# Patient Record
Sex: Male | Born: 1935
Health system: Southern US, Community
[De-identification: ages and names within clinical notes are randomized; demographics above are authoritative.]

## PROBLEM LIST (undated history)

## (undated) DIAGNOSIS — I509 Heart failure, unspecified: Secondary | ICD-10-CM

## (undated) DIAGNOSIS — R32 Unspecified urinary incontinence: Secondary | ICD-10-CM

## (undated) DIAGNOSIS — K469 Unspecified abdominal hernia without obstruction or gangrene: Secondary | ICD-10-CM

## (undated) DIAGNOSIS — IMO0002 Reserved for concepts with insufficient information to code with codable children: Secondary | ICD-10-CM

## (undated) DIAGNOSIS — H269 Unspecified cataract: Secondary | ICD-10-CM

## (undated) DIAGNOSIS — E7800 Pure hypercholesterolemia, unspecified: Secondary | ICD-10-CM

## (undated) DIAGNOSIS — R011 Cardiac murmur, unspecified: Secondary | ICD-10-CM

## (undated) DIAGNOSIS — I10 Essential (primary) hypertension: Secondary | ICD-10-CM

## (undated) DIAGNOSIS — K219 Gastro-esophageal reflux disease without esophagitis: Secondary | ICD-10-CM

## (undated) DIAGNOSIS — N189 Chronic kidney disease, unspecified: Secondary | ICD-10-CM

## (undated) DIAGNOSIS — IMO0001 Reserved for inherently not codable concepts without codable children: Secondary | ICD-10-CM

## (undated) DIAGNOSIS — M1990 Unspecified osteoarthritis, unspecified site: Secondary | ICD-10-CM

## (undated) DIAGNOSIS — K389 Disease of appendix, unspecified: Secondary | ICD-10-CM

## (undated) DIAGNOSIS — K859 Acute pancreatitis without necrosis or infection, unspecified: Secondary | ICD-10-CM

## (undated) DIAGNOSIS — C61 Malignant neoplasm of prostate: Secondary | ICD-10-CM

## (undated) DIAGNOSIS — I2510 Atherosclerotic heart disease of native coronary artery without angina pectoris: Secondary | ICD-10-CM

## (undated) DIAGNOSIS — I480 Paroxysmal atrial fibrillation: Secondary | ICD-10-CM

## (undated) HISTORY — DX: Unspecified urinary incontinence: R32

## (undated) HISTORY — PX: BACK SURGERY: SHX140

## (undated) HISTORY — PX: CORONARY ARTERY BYPASS GRAFT: SHX141

## (undated) HISTORY — DX: Gastro-esophageal reflux disease without esophagitis: K21.9

## (undated) HISTORY — DX: Cardiac murmur, unspecified: R01.1

## (undated) HISTORY — PX: OTHER SURGICAL HISTORY: SHX169

## (undated) HISTORY — PX: APPENDECTOMY: SHX54

## (undated) HISTORY — DX: Unspecified osteoarthritis, unspecified site: M19.90

## (undated) HISTORY — PX: RECTAL SURGERY: SHX760

## (undated) HISTORY — DX: Atherosclerotic heart disease of native coronary artery without angina pectoris: I25.10

## (undated) HISTORY — PX: HERNIA REPAIR: SHX51

## (undated) HISTORY — DX: Pure hypercholesterolemia, unspecified: E78.00

## (undated) HISTORY — DX: Chronic kidney disease, unspecified: N18.9

## (undated) HISTORY — DX: Acute pancreatitis without necrosis or infection, unspecified: K85.90

## (undated) HISTORY — DX: Unspecified cataract: H26.9

## (undated) HISTORY — DX: Reserved for concepts with insufficient information to code with codable children: IMO0002

---

## 2001-07-21 ENCOUNTER — Ambulatory Visit (HOSPITAL_COMMUNITY): Admission: RE | Admit: 2001-07-21 | Discharge: 2001-07-21 | Payer: Self-pay | Admitting: Gastroenterology

## 2001-07-21 ENCOUNTER — Other Ambulatory Visit: Admission: RE | Admit: 2001-07-21 | Discharge: 2001-07-21 | Payer: Self-pay | Admitting: Gastroenterology

## 2001-07-21 ENCOUNTER — Encounter: Payer: Self-pay | Admitting: Gastroenterology

## 2001-07-21 ENCOUNTER — Encounter (INDEPENDENT_AMBULATORY_CARE_PROVIDER_SITE_OTHER): Payer: Self-pay | Admitting: Specialist

## 2001-07-28 ENCOUNTER — Encounter (INDEPENDENT_AMBULATORY_CARE_PROVIDER_SITE_OTHER): Payer: Self-pay | Admitting: Specialist

## 2001-07-28 ENCOUNTER — Other Ambulatory Visit: Admission: RE | Admit: 2001-07-28 | Discharge: 2001-07-28 | Payer: Self-pay | Admitting: Gastroenterology

## 2002-10-18 HISTORY — PX: CARPAL TUNNEL RELEASE: SHX101

## 2002-11-22 ENCOUNTER — Encounter: Admission: RE | Admit: 2002-11-22 | Discharge: 2002-11-22 | Payer: Self-pay | Admitting: Family Medicine

## 2002-11-22 ENCOUNTER — Encounter: Payer: Self-pay | Admitting: Family Medicine

## 2003-07-11 ENCOUNTER — Ambulatory Visit (HOSPITAL_COMMUNITY): Admission: RE | Admit: 2003-07-11 | Discharge: 2003-07-11 | Payer: Self-pay | Admitting: General Surgery

## 2003-10-19 HISTORY — PX: ROBOT ASSISTED LAPAROSCOPIC RADICAL PROSTATECTOMY: SHX5141

## 2004-03-01 DIAGNOSIS — C61 Malignant neoplasm of prostate: Secondary | ICD-10-CM

## 2004-03-01 HISTORY — DX: Malignant neoplasm of prostate: C61

## 2004-04-13 ENCOUNTER — Ambulatory Visit (HOSPITAL_COMMUNITY): Admission: RE | Admit: 2004-04-13 | Discharge: 2004-04-13 | Payer: Self-pay | Admitting: Urology

## 2004-05-19 ENCOUNTER — Encounter (INDEPENDENT_AMBULATORY_CARE_PROVIDER_SITE_OTHER): Payer: Self-pay | Admitting: *Deleted

## 2004-05-19 ENCOUNTER — Inpatient Hospital Stay (HOSPITAL_COMMUNITY): Admission: RE | Admit: 2004-05-19 | Discharge: 2004-05-22 | Payer: Self-pay | Admitting: Urology

## 2004-11-10 ENCOUNTER — Observation Stay (HOSPITAL_COMMUNITY): Admission: RE | Admit: 2004-11-10 | Discharge: 2004-11-11 | Payer: Self-pay | Admitting: Urology

## 2005-06-22 ENCOUNTER — Ambulatory Visit: Payer: Self-pay | Admitting: Family Medicine

## 2005-07-27 ENCOUNTER — Ambulatory Visit: Payer: Self-pay | Admitting: Family Medicine

## 2006-09-27 ENCOUNTER — Ambulatory Visit: Payer: Self-pay | Admitting: Family Medicine

## 2006-09-27 LAB — CONVERTED CEMR LAB
ALT: 79 units/L — ABNORMAL HIGH (ref 0–40)
AST: 41 units/L — ABNORMAL HIGH (ref 0–37)
Basophils Relative: 0.6 % (ref 0.0–1.0)
CO2: 24 meq/L (ref 19–32)
Chloride: 107 meq/L (ref 96–112)
Creatinine, Ser: 0.9 mg/dL (ref 0.4–1.5)
Creatinine,U: 135.4 mg/dL
Eosinophil percent: 2 % (ref 0.0–5.0)
Glucose, Bld: 133 mg/dL — ABNORMAL HIGH (ref 70–99)
HCT: 48.1 % (ref 39.0–52.0)
HDL: 30.2 mg/dL — ABNORMAL LOW (ref >39.0)
Hgb A1c MFr Bld: 6.8 % — ABNORMAL HIGH (ref 4.6–6.0)
LDL DIRECT: 175 mg/dL
PSA: 0.17 ng/mL (ref 0.10–4.00)
Platelets: 203 10*3/uL (ref 150–400)
RBC: 5.19 M/uL (ref 4.22–5.81)
RDW: 12.3 % (ref 11.5–14.6)
Sodium: 139 meq/L (ref 135–145)
Triglyceride fasting, serum: 306 mg/dL (ref 0–149)
VLDL: 61 mg/dL — ABNORMAL HIGH (ref 0–40)
WBC: 7.8 10*3/uL (ref 4.5–10.5)

## 2006-10-05 ENCOUNTER — Ambulatory Visit: Payer: Self-pay | Admitting: Family Medicine

## 2006-10-29 ENCOUNTER — Encounter: Admission: RE | Admit: 2006-10-29 | Discharge: 2006-10-29 | Payer: Self-pay | Admitting: Family Medicine

## 2006-11-17 ENCOUNTER — Ambulatory Visit (HOSPITAL_COMMUNITY): Admission: RE | Admit: 2006-11-17 | Discharge: 2006-11-18 | Payer: Self-pay | Admitting: Neurosurgery

## 2007-01-17 ENCOUNTER — Ambulatory Visit: Payer: Self-pay | Admitting: Family Medicine

## 2007-01-17 ENCOUNTER — Ambulatory Visit (HOSPITAL_BASED_OUTPATIENT_CLINIC_OR_DEPARTMENT_OTHER): Admission: RE | Admit: 2007-01-17 | Discharge: 2007-01-17 | Payer: Self-pay | Admitting: Urology

## 2007-01-17 LAB — CONVERTED CEMR LAB
ALT: 68 units/L — ABNORMAL HIGH (ref 0–40)
Hgb A1c MFr Bld: 6.7 % — ABNORMAL HIGH (ref 4.6–6.0)
Triglycerides: 237 mg/dL (ref 0–149)

## 2007-04-27 ENCOUNTER — Ambulatory Visit: Payer: Self-pay | Admitting: Family Medicine

## 2007-04-27 LAB — CONVERTED CEMR LAB
ALT: 78 units/L — ABNORMAL HIGH (ref 0–53)
Cholesterol: 276 mg/dL (ref 0–200)
Direct LDL: 171 mg/dL
VLDL: 84 mg/dL — ABNORMAL HIGH (ref 0–40)

## 2007-05-17 ENCOUNTER — Telehealth: Payer: Self-pay | Admitting: Family Medicine

## 2007-06-12 ENCOUNTER — Encounter: Payer: Self-pay | Admitting: Family Medicine

## 2007-09-06 ENCOUNTER — Telehealth: Payer: Self-pay | Admitting: Family Medicine

## 2007-11-22 DIAGNOSIS — I10 Essential (primary) hypertension: Secondary | ICD-10-CM | POA: Insufficient documentation

## 2007-11-23 ENCOUNTER — Ambulatory Visit: Payer: Self-pay | Admitting: Family Medicine

## 2007-11-23 DIAGNOSIS — E785 Hyperlipidemia, unspecified: Secondary | ICD-10-CM | POA: Insufficient documentation

## 2007-11-23 DIAGNOSIS — E039 Hypothyroidism, unspecified: Secondary | ICD-10-CM | POA: Insufficient documentation

## 2007-11-23 DIAGNOSIS — N390 Urinary tract infection, site not specified: Secondary | ICD-10-CM

## 2007-11-23 DIAGNOSIS — D649 Anemia, unspecified: Secondary | ICD-10-CM | POA: Insufficient documentation

## 2007-11-23 DIAGNOSIS — E1121 Type 2 diabetes mellitus with diabetic nephropathy: Secondary | ICD-10-CM

## 2007-11-23 DIAGNOSIS — T50995A Adverse effect of other drugs, medicaments and biological substances, initial encounter: Secondary | ICD-10-CM

## 2007-11-23 DIAGNOSIS — R0609 Other forms of dyspnea: Secondary | ICD-10-CM

## 2007-11-23 DIAGNOSIS — R0989 Other specified symptoms and signs involving the circulatory and respiratory systems: Secondary | ICD-10-CM

## 2007-11-23 DIAGNOSIS — L719 Rosacea, unspecified: Secondary | ICD-10-CM

## 2007-11-29 ENCOUNTER — Encounter: Payer: Self-pay | Admitting: Family Medicine

## 2007-11-29 ENCOUNTER — Ambulatory Visit: Payer: Self-pay

## 2007-12-05 ENCOUNTER — Telehealth: Payer: Self-pay | Admitting: Family Medicine

## 2007-12-07 ENCOUNTER — Telehealth (INDEPENDENT_AMBULATORY_CARE_PROVIDER_SITE_OTHER): Payer: Self-pay | Admitting: *Deleted

## 2007-12-08 ENCOUNTER — Ambulatory Visit: Payer: Self-pay

## 2007-12-08 ENCOUNTER — Encounter: Payer: Self-pay | Admitting: Family Medicine

## 2008-01-18 ENCOUNTER — Ambulatory Visit: Payer: Self-pay | Admitting: Gastroenterology

## 2008-02-07 ENCOUNTER — Encounter: Payer: Self-pay | Admitting: Gastroenterology

## 2008-02-07 ENCOUNTER — Encounter: Payer: Self-pay | Admitting: Family Medicine

## 2008-02-07 ENCOUNTER — Ambulatory Visit: Payer: Self-pay | Admitting: Gastroenterology

## 2008-04-05 ENCOUNTER — Telehealth: Payer: Self-pay | Admitting: Family Medicine

## 2008-05-22 ENCOUNTER — Ambulatory Visit: Payer: Self-pay | Admitting: Family Medicine

## 2008-05-22 ENCOUNTER — Ambulatory Visit: Payer: Self-pay | Admitting: Cardiology

## 2008-05-22 ENCOUNTER — Inpatient Hospital Stay (HOSPITAL_COMMUNITY): Admission: AD | Admit: 2008-05-22 | Discharge: 2008-05-31 | Payer: Self-pay | Admitting: Cardiology

## 2008-05-23 ENCOUNTER — Encounter: Payer: Self-pay | Admitting: Family Medicine

## 2008-05-23 ENCOUNTER — Encounter: Payer: Self-pay | Admitting: Cardiothoracic Surgery

## 2008-05-23 ENCOUNTER — Telehealth: Payer: Self-pay | Admitting: Family Medicine

## 2008-05-23 ENCOUNTER — Ambulatory Visit: Payer: Self-pay | Admitting: Cardiothoracic Surgery

## 2008-05-24 ENCOUNTER — Encounter: Payer: Self-pay | Admitting: Cardiology

## 2008-05-24 ENCOUNTER — Encounter: Payer: Self-pay | Admitting: Cardiothoracic Surgery

## 2008-06-10 ENCOUNTER — Ambulatory Visit: Payer: Self-pay | Admitting: Cardiology

## 2008-06-21 ENCOUNTER — Ambulatory Visit: Payer: Self-pay | Admitting: Cardiothoracic Surgery

## 2008-06-21 ENCOUNTER — Encounter: Admission: RE | Admit: 2008-06-21 | Discharge: 2008-06-21 | Payer: Self-pay | Admitting: Cardiothoracic Surgery

## 2008-07-04 ENCOUNTER — Encounter (HOSPITAL_COMMUNITY): Admission: RE | Admit: 2008-07-04 | Discharge: 2008-10-02 | Payer: Self-pay | Admitting: Cardiology

## 2008-07-04 ENCOUNTER — Encounter: Payer: Self-pay | Admitting: Family Medicine

## 2008-07-05 ENCOUNTER — Encounter: Admission: RE | Admit: 2008-07-05 | Discharge: 2008-07-05 | Payer: Self-pay | Admitting: Cardiothoracic Surgery

## 2008-07-05 ENCOUNTER — Ambulatory Visit: Payer: Self-pay | Admitting: Cardiothoracic Surgery

## 2008-07-19 ENCOUNTER — Encounter: Admission: RE | Admit: 2008-07-19 | Discharge: 2008-07-19 | Payer: Self-pay | Admitting: Cardiothoracic Surgery

## 2008-07-19 ENCOUNTER — Ambulatory Visit: Payer: Self-pay | Admitting: Cardiothoracic Surgery

## 2008-08-02 ENCOUNTER — Encounter: Admission: RE | Admit: 2008-08-02 | Discharge: 2008-08-02 | Payer: Self-pay | Admitting: Cardiothoracic Surgery

## 2008-08-02 ENCOUNTER — Ambulatory Visit: Payer: Self-pay | Admitting: Cardiothoracic Surgery

## 2008-08-06 ENCOUNTER — Ambulatory Visit: Payer: Self-pay | Admitting: Cardiology

## 2008-08-06 LAB — CONVERTED CEMR LAB
Alkaline Phosphatase: 64 units/L (ref 39–117)
Bilirubin, Direct: 0.2 mg/dL (ref 0.0–0.3)
Cholesterol: 103 mg/dL (ref 0–200)
LDL Cholesterol: 57 mg/dL (ref 0–99)
Total Bilirubin: 1.2 mg/dL (ref 0.3–1.2)
Total Protein: 6.9 g/dL (ref 6.0–8.3)

## 2008-08-07 ENCOUNTER — Inpatient Hospital Stay (HOSPITAL_COMMUNITY): Admission: EM | Admit: 2008-08-07 | Discharge: 2008-08-09 | Payer: Self-pay | Admitting: Emergency Medicine

## 2008-08-07 ENCOUNTER — Ambulatory Visit: Payer: Self-pay | Admitting: Cardiology

## 2008-08-15 ENCOUNTER — Ambulatory Visit: Payer: Self-pay | Admitting: Cardiology

## 2008-08-15 LAB — CONVERTED CEMR LAB
BUN: 17 mg/dL (ref 6–23)
CO2: 27 meq/L (ref 19–32)
Chloride: 104 meq/L (ref 96–112)
Creatinine, Ser: 0.9 mg/dL (ref 0.4–1.5)
GFR calc non Af Amer: 88 mL/min
Glucose, Bld: 211 mg/dL — ABNORMAL HIGH (ref 70–99)

## 2008-08-16 ENCOUNTER — Ambulatory Visit: Payer: Self-pay | Admitting: Cardiology

## 2008-09-27 ENCOUNTER — Ambulatory Visit: Payer: Self-pay | Admitting: Cardiothoracic Surgery

## 2008-09-27 ENCOUNTER — Encounter: Admission: RE | Admit: 2008-09-27 | Discharge: 2008-09-27 | Payer: Self-pay | Admitting: Cardiothoracic Surgery

## 2008-10-14 ENCOUNTER — Ambulatory Visit: Payer: Self-pay | Admitting: Cardiology

## 2008-10-14 LAB — CONVERTED CEMR LAB
BUN: 14 mg/dL (ref 6–23)
Basophils Absolute: 0.2 10*3/uL — ABNORMAL HIGH (ref 0.0–0.1)
Basophils Relative: 2.2 % (ref 0.0–3.0)
Calcium: 9.7 mg/dL (ref 8.4–10.5)
Creatinine, Ser: 0.8 mg/dL (ref 0.4–1.5)
Eosinophils Absolute: 0.2 10*3/uL (ref 0.0–0.7)
Eosinophils Relative: 2.7 % (ref 0.0–5.0)
GFR calc non Af Amer: 101 mL/min
HCT: 42.4 % (ref 39.0–52.0)
Hemoglobin: 15 g/dL (ref 13.0–17.0)
MCHC: 35.3 g/dL (ref 30.0–36.0)
MCV: 83.1 fL (ref 78.0–100.0)
Monocytes Absolute: 0.7 10*3/uL (ref 0.1–1.0)
Neutro Abs: 4.7 10*3/uL (ref 1.4–7.7)
RBC: 5.11 M/uL (ref 4.22–5.81)
WBC: 9.1 10*3/uL (ref 4.5–10.5)
aPTT: 29.8 s (ref 21.7–29.8)

## 2008-10-17 ENCOUNTER — Inpatient Hospital Stay (HOSPITAL_BASED_OUTPATIENT_CLINIC_OR_DEPARTMENT_OTHER): Admission: RE | Admit: 2008-10-17 | Discharge: 2008-10-17 | Payer: Self-pay | Admitting: Cardiology

## 2008-10-17 ENCOUNTER — Ambulatory Visit: Payer: Self-pay | Admitting: Cardiovascular Disease

## 2008-10-17 ENCOUNTER — Ambulatory Visit: Payer: Self-pay | Admitting: Cardiology

## 2008-10-17 ENCOUNTER — Inpatient Hospital Stay (HOSPITAL_COMMUNITY): Admission: AD | Admit: 2008-10-17 | Discharge: 2008-10-18 | Payer: Self-pay | Admitting: Cardiovascular Disease

## 2008-11-05 ENCOUNTER — Ambulatory Visit: Payer: Self-pay | Admitting: Cardiology

## 2008-11-06 ENCOUNTER — Ambulatory Visit: Payer: Self-pay | Admitting: Family Medicine

## 2008-11-06 DIAGNOSIS — E876 Hypokalemia: Secondary | ICD-10-CM

## 2008-11-06 DIAGNOSIS — I25810 Atherosclerosis of coronary artery bypass graft(s) without angina pectoris: Secondary | ICD-10-CM

## 2008-11-11 LAB — CONVERTED CEMR LAB: PSA: 0.31 ng/mL (ref 0.10–4.00)

## 2008-11-18 ENCOUNTER — Ambulatory Visit: Payer: Self-pay | Admitting: Cardiology

## 2008-11-18 LAB — CONVERTED CEMR LAB
CO2: 28 meq/L (ref 19–32)
Calcium: 9.7 mg/dL (ref 8.4–10.5)
GFR calc Af Amer: 94 mL/min
Glucose, Bld: 155 mg/dL — ABNORMAL HIGH (ref 70–99)
Potassium: 4.3 meq/L (ref 3.5–5.1)
Sodium: 138 meq/L (ref 135–145)

## 2008-11-21 ENCOUNTER — Telehealth: Payer: Self-pay | Admitting: Family Medicine

## 2008-12-17 ENCOUNTER — Ambulatory Visit: Payer: Self-pay | Admitting: Cardiology

## 2009-01-09 ENCOUNTER — Ambulatory Visit: Payer: Self-pay | Admitting: Cardiology

## 2009-01-09 LAB — CONVERTED CEMR LAB
BUN: 13 mg/dL (ref 6–23)
Chloride: 105 meq/L (ref 96–112)
GFR calc non Af Amer: 87.71 mL/min (ref >60)
Glucose, Bld: 128 mg/dL — ABNORMAL HIGH (ref 70–99)
Potassium: 4.2 meq/L (ref 3.5–5.1)
Sodium: 139 meq/L (ref 135–145)

## 2009-03-18 ENCOUNTER — Ambulatory Visit: Payer: Self-pay | Admitting: Cardiology

## 2009-03-20 ENCOUNTER — Telehealth: Payer: Self-pay | Admitting: Cardiology

## 2009-04-07 ENCOUNTER — Ambulatory Visit: Payer: Self-pay | Admitting: Cardiology

## 2009-04-28 ENCOUNTER — Telehealth: Payer: Self-pay | Admitting: Cardiology

## 2009-04-28 LAB — CONVERTED CEMR LAB
ALT: 20 units/L (ref 0–53)
AST: 20 units/L (ref 0–37)
Albumin: 4 g/dL (ref 3.5–5.2)
Basophils Relative: 0.1 % (ref 0.0–3.0)
Cholesterol: 126 mg/dL (ref 0–200)
Eosinophils Relative: 3.1 % (ref 0.0–5.0)
HDL: 34.6 mg/dL — ABNORMAL LOW (ref >39.00)
LDL Cholesterol: 59 mg/dL (ref 0–99)
Lymphocytes Relative: 30 % (ref 12.0–46.0)
Monocytes Absolute: 0.5 10*3/uL (ref 0.1–1.0)
Monocytes Relative: 7 % (ref 3.0–12.0)
Neutrophils Relative %: 59.8 % (ref 43.0–77.0)
Platelets: 127 10*3/uL — ABNORMAL LOW (ref 150.0–400.0)
RBC: 3.98 M/uL — ABNORMAL LOW (ref 4.22–5.81)
Total Bilirubin: 1.6 mg/dL — ABNORMAL HIGH (ref 0.3–1.2)
VLDL: 32.6 mg/dL (ref 0.0–40.0)
WBC: 6.5 10*3/uL (ref 4.5–10.5)

## 2009-06-04 ENCOUNTER — Encounter (INDEPENDENT_AMBULATORY_CARE_PROVIDER_SITE_OTHER): Payer: Self-pay | Admitting: *Deleted

## 2009-07-16 ENCOUNTER — Ambulatory Visit: Payer: Self-pay | Admitting: Family Medicine

## 2009-07-16 DIAGNOSIS — M129 Arthropathy, unspecified: Secondary | ICD-10-CM | POA: Insufficient documentation

## 2009-07-16 DIAGNOSIS — E559 Vitamin D deficiency, unspecified: Secondary | ICD-10-CM | POA: Insufficient documentation

## 2009-07-16 LAB — HM COLONOSCOPY

## 2009-07-20 LAB — CONVERTED CEMR LAB
ALT: 21 units/L (ref 0–53)
Alkaline Phosphatase: 58 units/L (ref 39–117)
BUN: 24 mg/dL — ABNORMAL HIGH (ref 6–23)
Bilirubin, Direct: 0.1 mg/dL (ref 0.0–0.3)
Calcium: 9.9 mg/dL (ref 8.4–10.5)
Chloride: 107 meq/L (ref 96–112)
Cholesterol: 129 mg/dL (ref 0–200)
Creatinine, Ser: 1.3 mg/dL (ref 0.4–1.5)
Eosinophils Absolute: 0.2 10*3/uL (ref 0.0–0.7)
Eosinophils Relative: 2.8 % (ref 0.0–5.0)
HDL: 31 mg/dL — ABNORMAL LOW (ref >39.00)
Hgb A1c MFr Bld: 6.4 % (ref 4.6–6.5)
LDL Cholesterol: 71 mg/dL (ref 0–99)
Lymphocytes Relative: 29.4 % (ref 12.0–46.0)
MCV: 94.9 fL (ref 78.0–100.0)
Monocytes Absolute: 0.4 10*3/uL (ref 0.1–1.0)
Neutrophils Relative %: 60.9 % (ref 43.0–77.0)
PSA: 0.31 ng/mL (ref 0.10–4.00)
Platelets: 127 10*3/uL — ABNORMAL LOW (ref 150.0–400.0)
Total Bilirubin: 1.8 mg/dL — ABNORMAL HIGH (ref 0.3–1.2)
Total CHOL/HDL Ratio: 4
Triglycerides: 134 mg/dL (ref 0.0–149.0)
VLDL: 26.8 mg/dL (ref 0.0–40.0)
WBC: 6.2 10*3/uL (ref 4.5–10.5)

## 2009-07-30 ENCOUNTER — Telehealth: Payer: Self-pay | Admitting: Cardiology

## 2009-08-07 ENCOUNTER — Telehealth: Payer: Self-pay | Admitting: Family Medicine

## 2009-08-10 ENCOUNTER — Inpatient Hospital Stay (HOSPITAL_COMMUNITY): Admission: EM | Admit: 2009-08-10 | Discharge: 2009-08-15 | Payer: Self-pay | Admitting: Emergency Medicine

## 2009-08-10 ENCOUNTER — Ambulatory Visit: Payer: Self-pay | Admitting: Internal Medicine

## 2009-08-12 ENCOUNTER — Encounter: Payer: Self-pay | Admitting: Cardiology

## 2009-08-12 ENCOUNTER — Encounter: Payer: Self-pay | Admitting: Gastroenterology

## 2009-08-12 ENCOUNTER — Encounter: Payer: Self-pay | Admitting: Physician Assistant

## 2009-08-13 ENCOUNTER — Encounter: Payer: Self-pay | Admitting: Cardiology

## 2009-08-13 ENCOUNTER — Ambulatory Visit: Payer: Self-pay | Admitting: Gastroenterology

## 2009-08-22 ENCOUNTER — Encounter: Payer: Self-pay | Admitting: Cardiology

## 2009-08-29 ENCOUNTER — Telehealth: Payer: Self-pay | Admitting: Cardiology

## 2009-08-29 ENCOUNTER — Encounter (INDEPENDENT_AMBULATORY_CARE_PROVIDER_SITE_OTHER): Payer: Self-pay | Admitting: *Deleted

## 2009-09-03 ENCOUNTER — Encounter: Payer: Self-pay | Admitting: Physician Assistant

## 2009-09-03 ENCOUNTER — Telehealth: Payer: Self-pay | Admitting: Gastroenterology

## 2009-09-03 ENCOUNTER — Ambulatory Visit: Payer: Self-pay | Admitting: Cardiology

## 2009-09-03 DIAGNOSIS — K2211 Ulcer of esophagus with bleeding: Secondary | ICD-10-CM

## 2009-09-03 DIAGNOSIS — K8010 Calculus of gallbladder with chronic cholecystitis without obstruction: Secondary | ICD-10-CM

## 2009-09-05 ENCOUNTER — Ambulatory Visit: Payer: Self-pay | Admitting: Gastroenterology

## 2009-09-05 DIAGNOSIS — R1310 Dysphagia, unspecified: Secondary | ICD-10-CM | POA: Insufficient documentation

## 2009-09-09 ENCOUNTER — Ambulatory Visit: Payer: Self-pay | Admitting: Cardiology

## 2009-09-10 ENCOUNTER — Ambulatory Visit: Payer: Self-pay | Admitting: Gastroenterology

## 2009-09-17 ENCOUNTER — Encounter: Payer: Self-pay | Admitting: Gastroenterology

## 2009-09-17 HISTORY — PX: LAPAROSCOPIC CHOLECYSTECTOMY: SUR755

## 2009-09-30 ENCOUNTER — Encounter: Payer: Self-pay | Admitting: Gastroenterology

## 2009-10-01 ENCOUNTER — Encounter: Payer: Self-pay | Admitting: Gastroenterology

## 2009-10-01 ENCOUNTER — Encounter: Payer: Self-pay | Admitting: Cardiology

## 2009-10-03 ENCOUNTER — Telehealth: Payer: Self-pay | Admitting: Cardiology

## 2009-10-07 ENCOUNTER — Telehealth: Payer: Self-pay | Admitting: Cardiology

## 2009-10-11 ENCOUNTER — Inpatient Hospital Stay (HOSPITAL_COMMUNITY): Admission: EM | Admit: 2009-10-11 | Discharge: 2009-10-20 | Payer: Self-pay | Admitting: Cardiology

## 2009-10-11 ENCOUNTER — Ambulatory Visit: Payer: Self-pay | Admitting: Internal Medicine

## 2009-10-12 ENCOUNTER — Encounter: Payer: Self-pay | Admitting: Cardiology

## 2009-10-14 ENCOUNTER — Encounter: Payer: Self-pay | Admitting: Cardiology

## 2009-10-29 ENCOUNTER — Encounter: Payer: Self-pay | Admitting: Cardiology

## 2009-11-07 ENCOUNTER — Ambulatory Visit: Payer: Self-pay | Admitting: Cardiology

## 2009-11-12 ENCOUNTER — Encounter: Payer: Self-pay | Admitting: Cardiology

## 2009-11-26 ENCOUNTER — Encounter: Payer: Self-pay | Admitting: Family Medicine

## 2009-11-27 ENCOUNTER — Telehealth: Payer: Self-pay | Admitting: Cardiology

## 2009-12-03 ENCOUNTER — Encounter: Payer: Self-pay | Admitting: Cardiology

## 2009-12-18 ENCOUNTER — Encounter: Payer: Self-pay | Admitting: Family Medicine

## 2010-01-08 ENCOUNTER — Ambulatory Visit: Payer: Self-pay | Admitting: Cardiology

## 2010-01-08 DIAGNOSIS — I6529 Occlusion and stenosis of unspecified carotid artery: Secondary | ICD-10-CM

## 2010-01-13 ENCOUNTER — Telehealth: Payer: Self-pay | Admitting: Family Medicine

## 2010-01-27 ENCOUNTER — Ambulatory Visit: Payer: Self-pay

## 2010-01-27 ENCOUNTER — Encounter: Payer: Self-pay | Admitting: Cardiology

## 2010-02-20 ENCOUNTER — Encounter: Payer: Self-pay | Admitting: Cardiology

## 2010-02-24 ENCOUNTER — Ambulatory Visit: Payer: Self-pay | Admitting: Cardiology

## 2010-02-24 LAB — CONVERTED CEMR LAB
BUN: 20 mg/dL (ref 6–23)
Creatinine, Ser: 1.1 mg/dL (ref 0.4–1.5)
Eosinophils Relative: 1.9 % (ref 0.0–5.0)
GFR calc non Af Amer: 70.85 mL/min (ref >60)
Lymphocytes Relative: 23.2 % (ref 12.0–46.0)
Monocytes Relative: 5.8 % (ref 3.0–12.0)
Neutrophils Relative %: 68.8 % (ref 43.0–77.0)
Platelets: 168 10*3/uL (ref 150.0–400.0)
Pro B Natriuretic peptide (BNP): 1051.1 pg/mL — ABNORMAL HIGH (ref 0.0–100.0)
WBC: 8.3 10*3/uL (ref 4.5–10.5)

## 2010-04-02 ENCOUNTER — Ambulatory Visit: Payer: Self-pay | Admitting: Cardiology

## 2010-04-02 DIAGNOSIS — I255 Ischemic cardiomyopathy: Secondary | ICD-10-CM | POA: Insufficient documentation

## 2010-04-02 DIAGNOSIS — G4700 Insomnia, unspecified: Secondary | ICD-10-CM

## 2010-04-02 LAB — CONVERTED CEMR LAB
CO2: 28 meq/L (ref 19–32)
Calcium: 10.4 mg/dL (ref 8.4–10.5)
Chloride: 103 meq/L (ref 96–112)
Sodium: 143 meq/L (ref 135–145)

## 2010-04-06 ENCOUNTER — Ambulatory Visit: Payer: Self-pay | Admitting: Cardiology

## 2010-04-08 ENCOUNTER — Encounter: Payer: Self-pay | Admitting: Cardiology

## 2010-04-09 LAB — CONVERTED CEMR LAB
BUN: 46 mg/dL — ABNORMAL HIGH (ref 6–23)
Chloride: 103 meq/L (ref 96–112)
Potassium: 4.1 meq/L (ref 3.5–5.1)

## 2010-04-10 ENCOUNTER — Encounter: Payer: Self-pay | Admitting: Cardiology

## 2010-04-29 ENCOUNTER — Encounter: Payer: Self-pay | Admitting: Cardiology

## 2010-05-05 ENCOUNTER — Ambulatory Visit: Payer: Self-pay | Admitting: Cardiology

## 2010-05-07 ENCOUNTER — Encounter: Payer: Self-pay | Admitting: Cardiology

## 2010-06-10 ENCOUNTER — Encounter: Payer: Self-pay | Admitting: Cardiology

## 2010-07-08 ENCOUNTER — Encounter: Payer: Self-pay | Admitting: Family Medicine

## 2010-07-29 ENCOUNTER — Encounter: Payer: Self-pay | Admitting: Family Medicine

## 2010-08-06 ENCOUNTER — Ambulatory Visit: Payer: Self-pay | Admitting: Cardiology

## 2010-08-06 ENCOUNTER — Encounter: Payer: Self-pay | Admitting: Cardiology

## 2010-09-23 ENCOUNTER — Telehealth: Payer: Self-pay | Admitting: Family Medicine

## 2010-09-24 ENCOUNTER — Ambulatory Visit: Payer: Self-pay | Admitting: Family Medicine

## 2010-09-24 ENCOUNTER — Ambulatory Visit: Payer: Self-pay | Admitting: Internal Medicine

## 2010-09-24 DIAGNOSIS — S0990XA Unspecified injury of head, initial encounter: Secondary | ICD-10-CM | POA: Insufficient documentation

## 2010-10-13 ENCOUNTER — Telehealth: Payer: Self-pay | Admitting: Family Medicine

## 2010-11-15 LAB — CONVERTED CEMR LAB
Albumin: 4.7 g/dL (ref 3.5–5.2)
Basophils Absolute: 0 10*3/uL (ref 0.0–0.1)
Bilirubin, Direct: 0.3 mg/dL (ref 0.0–0.3)
CO2: 27 meq/L (ref 19–32)
Calcium: 9.4 mg/dL (ref 8.4–10.5)
Cholesterol, target level: 200 mg/dL
Eosinophils Absolute: 0.2 10*3/uL (ref 0.0–0.6)
GFR calc non Af Amer: 118 mL/min
Glucose, Bld: 129 mg/dL — ABNORMAL HIGH (ref 70–99)
Glucose, Urine, Semiquant: NEGATIVE
HCT: 50 % (ref 39.0–52.0)
Hemoglobin: 16.9 g/dL (ref 13.0–17.0)
Hgb A1c MFr Bld: 6.5 % — ABNORMAL HIGH (ref 4.6–6.0)
LDL Goal: 70 mg/dL
Lymphocytes Relative: 33.6 % (ref 12.0–46.0)
MCHC: 33.7 g/dL (ref 30.0–36.0)
MCV: 91.3 fL (ref 78.0–100.0)
Monocytes Absolute: 0.7 10*3/uL (ref 0.2–0.7)
Neutrophils Relative %: 58.6 % (ref 43.0–77.0)
PSA: 0.28 ng/mL (ref 0.10–4.00)
Potassium: 4 meq/L (ref 3.5–5.1)
Sodium: 139 meq/L (ref 135–145)
Sodium: 143 meq/L (ref 135–145)
Specific Gravity, Urine: >=1.03
TSH: 2.17 microintl units/mL (ref 0.35–5.50)
Total Bilirubin: 1.8 mg/dL — ABNORMAL HIGH (ref 0.3–1.2)
WBC Urine, dipstick: NEGATIVE
pH: 7

## 2010-11-17 NOTE — Medication Information (Signed)
Summary: Prescription Prior Authorization Request   Prescription Prior Authorization Request   Imported By: Sallee Provencal 04/23/2010 12:58:43  _____________________________________________________________________  External Attachment:    Type:   Image     Comment:   External Document

## 2010-11-17 NOTE — Letter (Signed)
Summary: Dr Grandville Silos Note  Dr Grandville Silos Note   Imported By: Jamelle Haring 11/26/2009 09:23:56  _____________________________________________________________________  External Attachment:    Type:   Image     Comment:   External Document

## 2010-11-17 NOTE — Assessment & Plan Note (Signed)
Summary: 1 month f/u   Visit Type:  Follow-up Primary Provider:  Emeterio Reeve MD  CC:  CHF and Afib.  History of Present Illness: The patient has been feeling much better. He is not having any acute shortness of breath. He is having no PND or orthopnea. He denies any chest pressure, neck or arm discomfort. He denies any palpitations, presyncope or syncope. He has had no weight gain or swelling. He continues to have some mild nausea and still has drainage from a small residual wound in his right abdomen. He may need to have surgical treatment of this. He is currently on antibiotics.  Of note I did increase his carvedilol at the last visit. He sits very mild dizziness and is bradycardic but overall seems to be tolerating this.  Current Medications (verified): 1)  Avandamet 4-500 Mg  Tabs (Rosiglitazone-Metformin) .... Two Times A Day 2)  Metrolotion 0.75 %  Lotn (Metronidazole) .... Apply Daily 3)  Freestyle Lancets  Misc (Lancets) .... Use Two Times A Day As Directed 4)  Freestyle Lite Test  Strp (Glucose Blood) .... Use  Two Times A Day As Directed 5)  Plavix 75 Mg Tabs (Clopidogrel Bisulfate) .Marland Kitchen.. 1 By Mouth Once Daily 6)  Benazepril Hcl 40 Mg Tabs (Benazepril Hcl) .... 1/2 By Mouth Two Times A Day 7)  Lipitor 80 Mg Tabs (Atorvastatin Calcium) .... 1/2 Per Day 8)  Clonidine Hcl 0.3 Mg Tabs (Clonidine Hcl) .Marland Kitchen.. 1 By Mouth Two Times A Day 9)  Nitroglycerin 0.4 Mg Subl (Nitroglycerin) .... As Directed Per Dr Percival Spanish 10)  Spironolactone 25 Mg Tabs (Spironolactone) .Marland Kitchen.. 1 By Mouth Daily 11)  Alprazolam 1 Mg Tabs (Alprazolam) .Marland Kitchen.. 1 Three Times A Day As Needed For Anxiety 12)  Ecotrin 325 Mg Tbec (Aspirin) .... Take 1 By Mouth Once Daily 13)  Pantoprazole Sodium 40 Mg Tbec (Pantoprazole Sodium) .Marland Kitchen.. 1 By Mouth Once Daily 14)  Klor-Con 10 10 Meq Cr-Tabs (Potassium Chloride) .... As Needed With Lasix 15)  Tramadol Hcl 50 Mg Tabs (Tramadol Hcl) .... As Needed 16)  Isosorbide Mononitrate  Cr 30 Mg Xr24h-Tab (Isosorbide Mononitrate) .... One Daily 17)  Furosemide 20 Mg Tabs (Furosemide) .... One Daily 18)  Carvedilol 6.25 Mg Tabs (Carvedilol) .... One Twice A Day 19)  Lunesta 1 Mg Tabs (Eszopiclone) .... One At Bedtime As Needed For Sleep 20)  Doxycycline Hyclate 100 Mg Cpep (Doxycycline Hyclate) .... Take 1 Tablet By Mouth Two Times A Day  Allergies: 1)  ! Codeine 2)  ! * Shellfish 3)  ! Morphine  Past History:  Past Medical History: Reviewed history from 01/08/2010 and no changes required. 1. Coronary artery disease (Anatomy as above, CABG, Taxus stent to the saphenous vein       graft to the obtuse marginal by in December 2009, Xience stent to the same graft Oct 2010)  2. Mild aortic stenosis.   3. Recurrent pleural effusions.   4. Hypertension.   5. Dyslipidemia.   6. Diabetes mellitus.   7. Degenerative joint disease.   8. Radical prostatectomy for prostate cancer.   9. Lower esophageal stricture (status post dilatation).   10.Chronic left bundle branch block.   11.Cholelithiasis  12.Cardiomyopathy (EF 30%)  Past Surgical History: Reviewed history from 01/08/2010 and no changes required. Appendectomy Hernia repair  Carpal tunnel  both hands 2004 CABG Laparoscopic cholecystectomy.  (09/2009) Incision and drainage of right chest abscess (09/2009)   Review of Systems       As  stated in the HPI and negative for all other systems.   Vital Signs:  Patient profile:   77 year old male Height:      68 inches Weight:      195.75 pounds BMI:     29.87 Pulse rate:   48 / minute Pulse rhythm:   regular Resp:     18 per minute BP sitting:   138 / 58  (left arm) Cuff size:   large  Vitals Entered By: Sidney Ace (May 05, 2010 9:38 AM)  Physical Exam  General:  Well developed, well nourished, in no acute distress. Head:  normocephalic and atraumatic Eyes:  PERRLA/EOM intact; conjunctiva and lids normal. Mouth:  Upper dentures. Oral mucosa  normal. Neck:  Neck supple, no JVD. No masses, thyromegaly or abnormal cervical nodes. Chest Wall:  well-healed sternotomy scar Lungs:  Clear bilaterally to auscultation and percussion. Abdomen:  Bowel sounds positive; abdomen soft and non-tender without masses, organomegaly, or hernias noted. No hepatosplenomegaly. is a bandage in the right lower quadrantwhich is clean and dry currently and covers a nonhealing percutaneous drainage wound Msk:  Back normal, normal gait. Muscle strength and tone normal. Extremities:  No clubbing or cyanosis. Neurologic:  Alert and oriented x 3. Skin:  Intact without lesions or rashes. Cervical Nodes:  no significant adenopathy Inguinal Nodes:  no significant adenopathy Psych:  Normal affect.   Detailed Cardiovascular Exam  Neck    Carotids: Carotids full and equal bilaterally without bruits.      Neck Veins: Normal, no JVD.    Heart    Inspection: no deformities or lifts noted.      Palpation: normal PMI with no thrills palpable.      Auscultation: regular rate and rhythm, S1, S2 with soft systolic murmurs, rubs, gallops, or clicks.    Vascular    Abdominal Aorta: no palpable masses, pulsations, or audible bruits.      Femoral Pulses: normal femoral pulses bilaterally.      Pedal Pulses: normal pedal pulses bilaterally.      Radial Pulses: diminished right radial pulse and diminished left radial pulse.      Peripheral Circulation: no clubbing, cyanosis, or edema noted with normal capillary refill.     EKG  Procedure date:  05/05/2010  Findings:      Sinus bradycardia, rate 48, left bundle branch block  Impression & Recommendations:  Problem # 1:  CARDIOMYOPATHY, ISCHEMIC (ICD-414.8) Htolerating the meds as listed though he does have bradycardia. I will continue the current regimen. I will be checking a basic metabolic profile today. Orders: EKG w/ Interpretation (93000) TLB-BMP (Basic Metabolic Panel-BMET) (99991111)  Problem # 2:   CORONARY ARTERY DISEASE (ICD-414.00) He has no ongoing angina. He will continue with risk reduction. Orders: EKG w/ Interpretation (93000)  Problem # 3:  HYPERTENSION (ICD-401.9) His blood pressure is controlled. He will continue the meds as listed.  Patient Instructions: 1)  Your physician recommends that you schedule a follow-up appointment in: 1 month with Dr Percival Spanish 2)  Your physician recommends that you return for lab work in: 2 weeks 3)  Your physician recommends that you continue on your current medications as directed. Please refer to the Current Medication list given to you today.  Appended Document: 1 month f/u No change in medications.  Pt doesn't need BMP in 2 weeks and will follow up in 3 months per Dr Percival Spanish.

## 2010-11-17 NOTE — Letter (Signed)
Summary: [facility] - Office Visit  [facility] - Office Visit   Imported By: Marilynne Drivers 07/29/2010 08:08:36  _____________________________________________________________________  External Attachment:    Type:   Image     Comment:   External Document

## 2010-11-17 NOTE — Assessment & Plan Note (Signed)
Summary: 2 MONTH/DMP   Visit Type:  Follow-up Referring Provider:  Georganna Skeans, MD Primary Provider:  Emeterio Reeve MD  CC:  CAD.  History of Present Illness: The patient presents for follow up of his CAD after a recent hospitalization for cystectomy. He's had a somewhat complicated recovery.  He had a laparoscopic cholecystectomy but had an abscess that needed to be drained as well. Since then he has had nausea. He has had abdominal discomfort when he has tried to eat solid foods. He said decreased sleep. He is trying to do some walking around his house. He is not describing PND or orthopnea. He did have an episode of chest and neck discomfort yesterday for which he took 2 sublingual nitroglycerin. This has not been a usual occurrence.  Problems Prior to Update: 1)  Dysphagia Unspecified  (ICD-787.20) 2)  Cholelithiasis, With Cholecystitis  (ICD-574.10) 3)  Ulcer of Esophagus With Bleeding  (ICD-530.21) 4)  Arthritis  (ICD-716.90) 5)  Vitamin D Deficiency  (ICD-268.9) 6)  Special Screening Malignant Neoplasm of Prostate  (ICD-V76.44) 7)  Coronary Artery Disease  (ICD-414.00) 8)  Hypokalemia  (ICD-276.8) 9)  Rosacea  (ICD-695.3) 10)  Neoplasm, Malignant, Prostate  (ICD-185) 11)  Dyspnea On Exertion  (ICD-786.09) 12)  Chest Pain, Exertional  (ICD-786.50) 13)  Diabetes Mellitus, Type II  (ICD-250.00) 14)  Special Screening Malignant Neoplasm of Prostate  (ICD-V76.44) 15)  Uti  (ICD-599.0) 16)  Hyperlipidemia  (ICD-272.4) 17)  Hypothyroidism  (ICD-244.9) 18)  Anemia  (ICD-285.9) 19)  Uns Advrs Eff Oth Rx Medicinal&biological Sbstnc  (ICD-995.29) 20)  Hypertension  (ICD-401.9)  Medications Prior to Update: 1)  Avandamet 4-500 Mg  Tabs (Rosiglitazone-Metformin) .... Two Times A Day 2)  Metrolotion 0.75 %  Lotn (Metronidazole) .... Apply Daily 3)  Freestyle Lancets  Misc (Lancets) .... Use Two Times A Day As Directed 4)  Freestyle Lite Test  Strp (Glucose Blood) .... Use   Two Times A Day As Directed 5)  Plavix 75 Mg Tabs (Clopidogrel Bisulfate) .Marland Kitchen.. 1 By Mouth Once Daily 6)  Benazepril Hcl 20 Mg Tabs (Benazepril Hcl) .Marland Kitchen.. 1 Tab Two Times A Day 7)  Lipitor 80 Mg Tabs (Atorvastatin Calcium) .... 1/2 Per Day 8)  Metoprolol Tartrate 50 Mg Tabs (Metoprolol Tartrate) .Marland Kitchen.. 1 By Mouth Two Times A Day 9)  Clonidine Hcl 0.3 Mg Tabs (Clonidine Hcl) .Marland Kitchen.. 1 By Mouth Two Times A Day 10)  Nitroglycerin 0.4 Mg Subl (Nitroglycerin) .... As Directed Per Dr Percival Spanish 11)  Spironolactone 50 Mg Tabs (Spironolactone) .... Take 1 By Mouth Once Daily 12)  Alprazolam 1 Mg Tabs (Alprazolam) .Marland Kitchen.. 1 Three Times A Day As Needed For Anxiety 13)  Ecotrin 325 Mg Tbec (Aspirin) .... Take 1 By Mouth Once Daily 14)  Ambien 10 Mg Tabs (Zolpidem Tartrate) .... One By Mouth At Bedtime 15)  Pantoprazole Sodium 40 Mg Tbec (Pantoprazole Sodium) .Marland Kitchen.. 1 By Mouth Once Daily  Current Medications (verified): 1)  Avandamet 4-500 Mg  Tabs (Rosiglitazone-Metformin) .... Two Times A Day 2)  Metrolotion 0.75 %  Lotn (Metronidazole) .... Apply Daily 3)  Freestyle Lancets  Misc (Lancets) .... Use Two Times A Day As Directed 4)  Freestyle Lite Test  Strp (Glucose Blood) .... Use  Two Times A Day As Directed 5)  Plavix 75 Mg Tabs (Clopidogrel Bisulfate) .Marland Kitchen.. 1 By Mouth Once Daily 6)  Benazepril Hcl 20 Mg Tabs (Benazepril Hcl) .Marland Kitchen.. 1 Tab Two Times A Day 7)  Lipitor 80 Mg Tabs (Atorvastatin  Calcium) .... 1/2 Per Day 8)  Metoprolol Tartrate 50 Mg Tabs (Metoprolol Tartrate) .Marland Kitchen.. 1 By Mouth Two Times A Day 9)  Clonidine Hcl 0.3 Mg Tabs (Clonidine Hcl) .Marland Kitchen.. 1 By Mouth Two Times A Day 10)  Nitroglycerin 0.4 Mg Subl (Nitroglycerin) .... As Directed Per Dr Percival Spanish 11)  Spironolactone 25 Mg Tabs (Spironolactone) .Marland Kitchen.. 1 By Mouth Daily 12)  Alprazolam 1 Mg Tabs (Alprazolam) .Marland Kitchen.. 1 Three Times A Day As Needed For Anxiety 13)  Ecotrin 325 Mg Tbec (Aspirin) .... Take 1 By Mouth Once Daily 14)  Pantoprazole Sodium 40 Mg Tbec  (Pantoprazole Sodium) .Marland Kitchen.. 1 By Mouth Once Daily 15)  Furosemide 40 Mg Tabs (Furosemide) .... As Needed 16)  Klor-Con 10 10 Meq Cr-Tabs (Potassium Chloride) .... As Needed With Lasix 17)  Tramadol Hcl 50 Mg Tabs (Tramadol Hcl) .... As Needed  Allergies (verified): 1)  ! Codeine 2)  ! * Shellfish 3)  ! Morphine  Past History:  Past Medical History: 1. Coronary artery disease (Anatomy as above, CABG, Taxus stent to the saphenous vein       graft to the obtuse marginal by in December 2009, Xience stent to the same graft Oct 2010)  2. Mild aortic stenosis.   3. Recurrent pleural effusions.   4. Hypertension.   5. Dyslipidemia.   6. Diabetes mellitus.   7. Degenerative joint disease.   8. Radical prostatectomy for prostate cancer.   9. Lower esophageal stricture (status post dilatation).   10.Chronic left bundle branch block.   11.Cholelithiasis  Past Surgical History: Appendectomy Hernia repair  Carpal tunnel  both hands 2004 CABG Laparoscopic cholecystectomy.  (09/2009) Incision and drainage of right chest abscess (09/2009) recent heartbeat and that  Review of Systems       As stated in the HPI and negative for all other systems.   Vital Signs:  Patient profile:   77 year old male Height:      68 inches Weight:      186 pounds BMI:     28.38 Pulse rate:   98 / minute Resp:     16 per minute BP sitting:   148 / 79  (right arm)  Vitals Entered By: Levora Angel, CNA (November 07, 2009 9:29 AM)  l  Physical Exam  General:  He looks fatigued and chronically ill but in no acute distress Head:  normocephalic and atraumatic Eyes:  PERRLA/EOM intact; conjunctiva and lids normal. Mouth:  Oral mucosa and oropharynx without lesions or exudates.  Teeth in good repair. Neck:  No deformities, masses, or tenderness noted. Chest Wall:  Well-healed sternotomy scar Lungs:  Clear bilaterally to auscultation and percussion. Heart:  S1 and S2 within normal limits, no S3, no S4, 2/6  systolic murmur heard at the apex an early peaking with slight radiation up the axilla, no diastolic murmurs. The PMI is somewhat displaced laterally. Abdomen:  Well-healed surgical scars, positive bowel sounds without rebound or guarding, no hepatomegaly, no splenomegaly. There is a bandage in the right upper quadrant which I did not uncover. Msk:  Back normal, normal gait. Muscle strength and tone normal. Extremities:  Mild bilateral lower extremity edema, 2+ pulses, no cyanosis, or clubbing. Neurologic:  Alert and oriented x 3. Skin:  Intact without lesions or rashes. Psych:  Normal affect.   EKG  Procedure date:  11/07/2009  Findings:      sinus rhythm, rate 94, left bundle branch block, right axis deviation.  The axis shift is new compared  with previous and probably related to limb lead reversal  Impression & Recommendations:  Problem # 1:  CORONARY ARTERY DISEASE (ICD-414.00) The patient has had no acute cardiac complaints despite his recent difficult hospitalization. He did have one episode of pain yesterday requiring nitroglycerin. He should continue to be managed medically. At this point I will not titrate his meds further. Orders: EKG w/ Interpretation (93000)  Problem # 2:  HYPERTENSION (ICD-401.9) His blood pressure is slightly elevated. However, given his difficult recovery so far I will not titrate meds to treat this mildly abnormal blood pressure but we'll follow this permit titrations as needed in the future.  Problem # 3:  HYPERLIPIDEMIA (B2193296.4) We can follow this once he has recovered.  Patient Instructions: 1)  Your physician recommends that you schedule a follow-up appointment in: 2 months with Dr Percival Spanish 2)  Your physician recommends that you continue on your current medications as directed. Please refer to the Current Medication list given to you today.

## 2010-11-17 NOTE — Consult Note (Signed)
Summary: Consultation Report  Consultation Report   Imported By: Marilynne Drivers 12/22/2009 10:05:57  _____________________________________________________________________  External Attachment:    Type:   Image     Comment:   External Document

## 2010-11-17 NOTE — Miscellaneous (Signed)
  Clinical Lists Changes  Observations: Added new observation of US CAROTID: MIld to moderate, mixed plaque, bilaterally 40-59% bilateral ICA stenosis  f/u/1 year (01/27/2010 11:20)      Carotid Doppler  Procedure date:  01/27/2010  Findings:      MIld to moderate, mixed plaque, bilaterally 40-59% bilateral ICA stenosis  f/u/1 year

## 2010-11-17 NOTE — Letter (Signed)
Summary: [facility] Office Visit Note   [facility] Office Visit Note   Imported By: Sallee Provencal 06/04/2010 12:33:57  _____________________________________________________________________  External Attachment:    Type:   Image     Comment:   External Document

## 2010-11-17 NOTE — Assessment & Plan Note (Signed)
Summary: 3 month rov 428.22 414.01  pfh,rn   Visit Type:  Follow-up Primary Provider:  Emeterio Reeve MD  CC:  CAD.  History of Present Illness: The patient presents for followup of his coronary disease and cardiomyopathy. Since I last saw him he has been recovering from his gallbladder surgery and infection. He still on antibiotics and had some nausea likely associated with this. He is not having any chest pressure, neck or arm discomfort. He has not had any acute shortness of breath, PND or orthopnea. He does describe some palpitations at night in particular that may last up to 30 seconds though when he takes his pulse his heart rate seems to be regular and slow. He's not had any presyncope or syncope associated with this. He is trying to be active but is not exercising.  Current Medications (verified): 1)  Avandamet 4-500 Mg  Tabs (Rosiglitazone-Metformin) .... Two Times A Day 2)  Metrolotion 0.75 %  Lotn (Metronidazole) .... Apply Daily 3)  Freestyle Lancets  Misc (Lancets) .... Use Two Times A Day As Directed 4)  Freestyle Lite Test  Strp (Glucose Blood) .... Use  Two Times A Day As Directed 5)  Plavix 75 Mg Tabs (Clopidogrel Bisulfate) .Marland Kitchen.. 1 By Mouth Once Daily 6)  Benazepril Hcl 40 Mg Tabs (Benazepril Hcl) .... 1/2 Tablet Twice A Day 7)  Lipitor 80 Mg Tabs (Atorvastatin Calcium) .... 1/2 Per Day 8)  Clonidine Hcl 0.3 Mg Tabs (Clonidine Hcl) .Marland Kitchen.. 1 By Mouth Two Times A Day 9)  Nitroglycerin 0.4 Mg Subl (Nitroglycerin) .... As Directed Per Dr Percival Spanish 10)  Spironolactone 25 Mg Tabs (Spironolactone) .Marland Kitchen.. 1 By Mouth Daily 11)  Alprazolam 1 Mg Tabs (Alprazolam) .Marland Kitchen.. 1 Three Times A Day As Needed For Anxiety 12)  Ecotrin 325 Mg Tbec (Aspirin) .... Take 1 By Mouth Once Daily 13)  Pantoprazole Sodium 40 Mg Tbec (Pantoprazole Sodium) .Marland Kitchen.. 1 By Mouth Once Daily 14)  Klor-Con 10 10 Meq Cr-Tabs (Potassium Chloride) .... As Needed With Lasix 15)  Tramadol Hcl 50 Mg Tabs (Tramadol Hcl) ....  As Needed 16)  Isosorbide Mononitrate Cr 30 Mg Xr24h-Tab (Isosorbide Mononitrate) .... One Daily 17)  Furosemide 20 Mg Tabs (Furosemide) .... One Daily 18)  Carvedilol 6.25 Mg Tabs (Carvedilol) .... One Twice A Day 19)  Lunesta 1 Mg Tabs (Eszopiclone) .... One At Bedtime As Needed For Sleep 20)  Doxycycline Hyclate 100 Mg Cpep (Doxycycline Hyclate) .... Take 1 Tablet By Mouth Two Times A Day 21)  Bactrim 400-80 Mg Tabs (Sulfamethoxazole-Trimethoprim) .Marland Kitchen.. 1 By Mouth Two Times A Day  Allergies (verified): 1)  ! Codeine 2)  ! * Shellfish 3)  ! Morphine  Past History:  Past Medical History: Reviewed history from 01/08/2010 and no changes required. 1. Coronary artery disease (Anatomy as above, CABG, Taxus stent to the saphenous vein       graft to the obtuse marginal by in December 2009, Xience stent to the same graft Oct 2010)  2. Mild aortic stenosis.   3. Recurrent pleural effusions.   4. Hypertension.   5. Dyslipidemia.   6. Diabetes mellitus.   7. Degenerative joint disease.   8. Radical prostatectomy for prostate cancer.   9. Lower esophageal stricture (status post dilatation).   10.Chronic left bundle branch block.   11.Cholelithiasis  12.Cardiomyopathy (EF 30%)  Past Surgical History: Reviewed history from 01/08/2010 and no changes required. Appendectomy Hernia repair  Carpal tunnel  both hands 2004 CABG Laparoscopic cholecystectomy.  (09/2009)  Incision and drainage of right chest abscess (09/2009)   Review of Systems       Status post mild left anterior tibial trauma with soreness and swelling in the left lower extremity. Otherwise as stated in the history of present illness negative for all other systems.  Vital Signs:  Patient profile:   77 year old male Height:      68 inches Weight:      191 pounds BMI:     29.15 Pulse rate:   57 / minute Resp:     16 per minute BP sitting:   128 / 82  (right arm)  Vitals Entered By: Levora Angel, CNA (August 06, 2010  8:56 AM)  Physical Exam  General:  Well developed, well nourished, in no acute distress. Head:  normocephalic and atraumatic Eyes:  PERRLA/EOM intact; conjunctiva and lids normal. Mouth:  Upper dentures. Oral mucosa normal. Neck:  Neck supple, no JVD. No masses, thyromegaly or abnormal cervical nodes. Chest Wall:  well-healed sternotomy scar Lungs:  Clear bilaterally to auscultation and percussion. Abdomen:  Bowel sounds positive; abdomen soft and non-tender without masses, organomegaly, or hernias noted. No hepatosplenomegaly Msk:  Back normal, normal gait. Muscle strength and tone normal. Extremities:  Mild left lower extremity anterior tibial swelling with slight bruising Neurologic:  Alert and oriented x 3. Skin:  Intact without lesions or rashes. Cervical Nodes:  no significant adenopathy Inguinal Nodes:  no significant adenopathy Psych:  Normal affect.   Detailed Cardiovascular Exam  Neck    Carotids: Carotids full and equal bilaterally without bruits.      Neck Veins: Normal, no JVD.    Heart    Inspection: no deformities or lifts noted.      Palpation: normal PMI with no thrills palpable.      Auscultation: regular rate and rhythm, S1, S2 with soft systolic murmurs, rubs, gallops, or clicks.    Vascular    Abdominal Aorta: no palpable masses, pulsations, or audible bruits.      Femoral Pulses: normal femoral pulses bilaterally.      Pedal Pulses: normal pedal pulses bilaterally.      Radial Pulses: diminished right radial pulse and diminished left radial pulse.      Peripheral Circulation: no clubbing, cyanosis, or edema noted with normal capillary refill.     Impression & Recommendations:  Problem # 1:  CARDIOMYOPATHY, ISCHEMIC (ICD-414.8) Today I will begin to wean down clonidine. I would rather maximize ACE or even an ARB in the future. He will go to 0.2 mg b.i.d. clonidine. If I can titrate the ACE, add an ARB were titrate beta blocker I will do that going  forward.  Problem # 2:  CORONARY ARTERY DISEASE (ICD-414.00) I will continue with risk reduction. He is to reduce his aspirin to 81 mg daily to see if any of his nausea is associated with this. Orders: EKG w/ Interpretation (93000)  Problem # 3:  HYPERTENSION (ICD-401.9) His blood pressure will be managed in the context of treating  Patient Instructions: 1)  Your physician recommends that you schedule a follow-up appointment in: 4 months with Dr Percival Spanish 2)  Your physician has recommended you make the following change in your medication: Decrease Clonidine to 0.2 mg one twice a day and ASA to 81 mg a day. Prescriptions: CLONIDINE HCL 0.2 MG TABS (CLONIDINE HCL) one twice a day  #60 x 11   Entered by:   Sim Boast, RN   Authorized by:   Jeneen Rinks  Percival Spanish, MD, [facility]   Signed by:   Sim Boast, RN on 08/06/2010   Method used:   Electronically to        [facility] [phone]* (retail)       [address]       [place], [place]  [address]       Ph: [phone] or [phone]       Fax: [phone]   RxID:   [id number]  I have reviewed and approved all prescriptions at the time of this visit. [name], MD, [facility]  August 06, 2010 9:38 AM

## 2010-11-17 NOTE — Progress Notes (Signed)
Summary: refill alprazolam  w 5 refills   Phone Note From Pharmacy   [facility]  Chanda Busing [phone]* Reason for Call: Needs renewal Summary of Call: refill alprazolam   Initial call taken by: Levora Angel, RN,  January 13, 2010 1:02 PM  Follow-up for Phone Call        ok x 5 per dr Joni Fears  Follow-up by: Levora Angel, RN,  January 13, 2010 1:04 PM    New/Updated Medications: ALPRAZOLAM 1 MG TABS (ALPRAZOLAM) 1 three times a day as needed for anxiety Prescriptions: ALPRAZOLAM 1 MG TABS (ALPRAZOLAM) 1 three times a day as needed for anxiety  #90 x 5   Entered by:   Levora Angel, RN   Authorized by:   Emeterio Reeve MD   Signed by:   Levora Angel, RN on 01/13/2010   Method used:   Telephoned to ...       [facility] [address] (retail)       [address]       [place], [place]  [address]       Ph: [phone] or [phone]       Fax: [phone]   RxID:   [id number]

## 2010-11-17 NOTE — Medication Information (Signed)
Summary: Coverage Approval for Pantoprazole Sodium/[employer]  Coverage Approval for Pantoprazole Sodium/[employer]   Imported By: Laural Benes 12/01/2009 14:22:36  _____________________________________________________________________  External Attachment:    Type:   Image     Comment:   External Document

## 2010-11-17 NOTE — Assessment & Plan Note (Signed)
Summary: per check out/sf   Visit Type:  Follow-up Primary Provider:  Emeterio Reeve MD  CC:  CAD/Cardiomyopathy.  History of Present Illness: The patient presents for evaluation of the above. Since I last saw him he says that his blood pressure is elevated. He has been more short of breath. He describes dyspnea walking a short distance on level ground. He sleeps chronically in a chair because it is more comfortable but does not describe PND or orthopnea. He does not describe palpitations, presyncope or syncope. He is not having any chest pressure. He has not had any cough or productive sputum. He has had some drainage mildly from the previous closed percutaneous cholecystectomy wound. He thinks he's had some very mild fever. He's had chronic lower extremity swelling. His weights have been stable since initially losing weight with his illness.  Of note his blood pressures have been consistently elevated as well.  Current Medications (verified): 1)  Avandamet 4-500 Mg  Tabs (Rosiglitazone-Metformin) .... Two Times A Day 2)  Metrolotion 0.75 %  Lotn (Metronidazole) .... Apply Daily 3)  Freestyle Lancets  Misc (Lancets) .... Use Two Times A Day As Directed 4)  Freestyle Lite Test  Strp (Glucose Blood) .... Use  Two Times A Day As Directed 5)  Plavix 75 Mg Tabs (Clopidogrel Bisulfate) .Marland Kitchen.. 1 By Mouth Once Daily 6)  Benazepril Hcl 20 Mg Tabs (Benazepril Hcl) .Marland Kitchen.. 1 Tab Two Times A Day 7)  Lipitor 80 Mg Tabs (Atorvastatin Calcium) .... 1/2 Per Day 8)  Metoprolol Tartrate 50 Mg Tabs (Metoprolol Tartrate) .... 1/2 Tablet Bid 9)  Clonidine Hcl 0.3 Mg Tabs (Clonidine Hcl) .Marland Kitchen.. 1 By Mouth Two Times A Day 10)  Nitroglycerin 0.4 Mg Subl (Nitroglycerin) .... As Directed Per Dr Percival Spanish 11)  Spironolactone 25 Mg Tabs (Spironolactone) .Marland Kitchen.. 1 By Mouth Daily 12)  Alprazolam 1 Mg Tabs (Alprazolam) .Marland Kitchen.. 1 Three Times A Day As Needed For Anxiety 13)  Ecotrin 325 Mg Tbec (Aspirin) .... Take 1 By Mouth Once  Daily 14)  Pantoprazole Sodium 40 Mg Tbec (Pantoprazole Sodium) .Marland Kitchen.. 1 By Mouth Once Daily 15)  Klor-Con 10 10 Meq Cr-Tabs (Potassium Chloride) .... As Needed With Lasix 16)  Tramadol Hcl 50 Mg Tabs (Tramadol Hcl) .... As Needed 17)  Isosorbide Mononitrate Cr 30 Mg Xr24h-Tab (Isosorbide Mononitrate) .... One Daily  Allergies (verified): 1)  ! Codeine 2)  ! * Shellfish 3)  ! Morphine  Past History:  Past Medical History: Reviewed history from 01/08/2010 and no changes required. 1. Coronary artery disease (Anatomy as above, CABG, Taxus stent to the saphenous vein       graft to the obtuse marginal by in December 2009, Xience stent to the same graft Oct 2010)  2. Mild aortic stenosis.   3. Recurrent pleural effusions.   4. Hypertension.   5. Dyslipidemia.   6. Diabetes mellitus.   7. Degenerative joint disease.   8. Radical prostatectomy for prostate cancer.   9. Lower esophageal stricture (status post dilatation).   10.Chronic left bundle branch block.   11.Cholelithiasis  12.Cardiomyopathy (EF 30%)  Past Surgical History: Reviewed history from 01/08/2010 and no changes required. Appendectomy Hernia repair  Carpal tunnel  both hands 2004 CABG Laparoscopic cholecystectomy.  (09/2009) Incision and drainage of right chest abscess (09/2009)   Review of Systems       As stated in the HPI and negative for all other systems.   Vital Signs:  Patient profile:   77 year old  male Height:      68 inches Weight:      198 pounds BMI:     30.21 Pulse rate:   50 / minute Resp:     16 per minute BP sitting:   195 / 80  (right arm)  Vitals Entered By: Levora Angel, CNA (Feb 24, 2010 9:22 AM)  Physical Exam  General:  Well developed, well nourished, in no acute distress. Head:  normocephalic and atraumatic Eyes:  PERRLA/EOM intact; conjunctiva and lids normal. Mouth:  Upper dentures. Oral mucosa normal. Neck:  Neck supple, no JVD. No masses, thyromegaly or abnormal cervical  nodes. Chest Wall:  well-healed sternotomy scar Lungs:  Clear bilaterally to auscultation and percussion. Abdomen:  Bowel sounds positive; abdomen soft and non-tender without masses, organomegaly, or hernias noted. No hepatosplenomegaly. Msk:  Back normal, normal gait. Muscle strength and tone normal. Extremities:  Moderate bilateral lower stomach swelling Neurologic:  Alert and oriented x 3. Skin:  Intact without lesions or rashes. Cervical Nodes:  no significant adenopathy Axillary Nodes:  no significant adenopathy Inguinal Nodes:  no significant adenopathy Psych:  Normal affect.   Detailed Cardiovascular Exam  Neck    Carotids: Carotids full and equal bilaterally without bruits.      Neck Veins: Normal, no JVD.    Heart    Inspection: no deformities or lifts noted.      Palpation: normal PMI with no thrills palpable.      Auscultation: regular rate and rhythm, S1, S2 without murmurs, rubs, gallops, or clicks.    Vascular    Abdominal Aorta: no palpable masses, pulsations, or audible bruits.      Femoral Pulses: normal femoral pulses bilaterally.      Pedal Pulses: normal pedal pulses bilaterally.      Radial Pulses: diminished right radial pulse and diminished left radial pulse.      Peripheral Circulation: no clubbing, cyanosis, or edema noted with normal capillary refill.     Impression & Recommendations:  Problem # 1:  DYSPNEA ON EXERTION (ICD-786.09) I am concerned about his dyspnea. I will check a BNP level. I will also check some other labs to include CBC and a basic metabolic profile. I am going to start Lasix 20 mg daily. I'm also going to double his benazepril. I did check a chest x-ray today and there was no evidence of recurrent effusion. Orders: T-2 View CXR ([id number]) TLB-BNP (B-Natriuretic Peptide) (83880-BNPR) TLB-CBC Platelet - w/Differential (85025-CBCD)  Problem # 2:  CAROTID ARTERY STENOSIS (ICD-433.10) He will follow this as indicated.  Problem # 3:   HYPERTENSION (ICD-401.9)  His blood pressure will be treated in the context of managing his cardiomyopathy and dyspnea.  Orders: TLB-BMP (Basic Metabolic Panel-BMET) (99991111)  Problem # 4:  CORONARY ARTERY DISEASE (ICD-414.00) He has no active angina. No further testing is planned. I will again aggressively managed medically.  Patient Instructions: 1)  Your physician recommends that you schedule a follow-up appointment in: 1 month with Dr Percival Spanish 2)  Your physician recommends that you have lab work today  BNP, BMP and CBC 3)  Your physician has recommended you make the following change in your medication: Start Furosemide 20 mg a day, double Benazepril to 40 mg twice a day. Prescriptions: BENAZEPRIL HCL 40 MG TABS (BENAZEPRIL HCL) one twice a day  #60 x 11   Entered by:   Sim Boast, RN   Authorized by:   [name], MD, [facility]   Signed by:   Olin Hauser  Fleming-Hayes, RN on 02/24/2010   Method used:   [facility]       [address]       [place], [place]  [address]       Ph: [phone] or [phone]       Fax: [phone]   RxID:   [id number] FUROSEMIDE 20 MG TABS (FUROSEMIDE) one daily  #30 x 6   Entered by:   Sim Boast, RN   Authorized by:   [name], MD, [facility]   Signed by:   Sim Boast, RN on 02/24/2010   Method used:   Electronically to        [facility] [phone]* (retail)       [address]       [place], [place]  [address]       Ph: [phone] or [phone]       Fax: [phone]   RxID:   [id number]

## 2010-11-17 NOTE — Assessment & Plan Note (Signed)
Summary: 2 month rov.sl   Visit Type:  Follow-up Primary Provider:  Emeterio Reeve MD  CC:  CAD.  History of Present Illness: The patient presents for followup after having cholecystectomy. He had no cardiac complications with this. He does describe some ongoing chest discomfort. This has been for 3-4 weeks. It is mild. It is constant. It is not made worse by activity. He is not sure whether it is similar to previous angina. It's not nearly as severe as previous pain. He has taken 2 nitroglycerin in the last few weeks with resolution of this. It also goes away with anxiolytics. He doesn't describe associated nausea vomiting or diaphoresis. He doesn't describe new PND or orthopnea. He has some chronic dyspnea. He did not have any new palpitations, presyncope or syncope. He does describe some constant neck discomfort that he thinks might be related to position or musculoskeletal.  Of note the patient reports that he was recently told he had diminished blood flow in the vessels noted on ophthalmologic exam. He was told to mention this to his cardiologist.  Lipid Management History:      Positive NCEP/ATP III risk factors include male age 57 years old or older, diabetes, HDL cholesterol less than 40, hypertension, and ASHD (either angina/prior MI/prior CABG).  Negative NCEP/ATP III risk factors include non-tobacco-user status.     Current Medications (verified): 1)  Avandamet 4-500 Mg  Tabs (Rosiglitazone-Metformin) .... Two Times A Day 2)  Metrolotion 0.75 %  Lotn (Metronidazole) .... Apply Daily 3)  Freestyle Lancets  Misc (Lancets) .... Use Two Times A Day As Directed 4)  Freestyle Lite Test  Strp (Glucose Blood) .... Use  Two Times A Day As Directed 5)  Plavix 75 Mg Tabs (Clopidogrel Bisulfate) .Marland Kitchen.. 1 By Mouth Once Daily 6)  Benazepril Hcl 20 Mg Tabs (Benazepril Hcl) .Marland Kitchen.. 1 Tab Two Times A Day 7)  Lipitor 80 Mg Tabs (Atorvastatin Calcium) .... 1/2 Per Day 8)  Metoprolol Tartrate 50 Mg  Tabs (Metoprolol Tartrate) .Marland Kitchen.. 1 By Mouth Two Times A Day 9)  Clonidine Hcl 0.3 Mg Tabs (Clonidine Hcl) .Marland Kitchen.. 1 By Mouth Two Times A Day 10)  Nitroglycerin 0.4 Mg Subl (Nitroglycerin) .... As Directed Per Dr Percival Spanish 11)  Spironolactone 25 Mg Tabs (Spironolactone) .Marland Kitchen.. 1 By Mouth Daily 12)  Alprazolam 1 Mg Tabs (Alprazolam) .Marland Kitchen.. 1 Three Times A Day As Needed For Anxiety 13)  Ecotrin 325 Mg Tbec (Aspirin) .... Take 1 By Mouth Once Daily 14)  Pantoprazole Sodium 40 Mg Tbec (Pantoprazole Sodium) .Marland Kitchen.. 1 By Mouth Once Daily 15)  Klor-Con 10 10 Meq Cr-Tabs (Potassium Chloride) .... As Needed With Lasix 16)  Tramadol Hcl 50 Mg Tabs (Tramadol Hcl) .... As Needed  Allergies (verified): 1)  ! Codeine 2)  ! * Shellfish 3)  ! Morphine  Past History:  Past Medical History: 1. Coronary artery disease (Anatomy as above, CABG, Taxus stent to the saphenous vein       graft to the obtuse marginal by in December 2009, Xience stent to the same graft Oct 2010)  2. Mild aortic stenosis.   3. Recurrent pleural effusions.   4. Hypertension.   5. Dyslipidemia.   6. Diabetes mellitus.   7. Degenerative joint disease.   8. Radical prostatectomy for prostate cancer.   9. Lower esophageal stricture (status post dilatation).   10.Chronic left bundle branch block.   11.Cholelithiasis  12.Cardiomyopathy (EF 30%)  Past Surgical History: Appendectomy Hernia repair  Carpal tunnel  both hands 2004 CABG Laparoscopic cholecystectomy.  (09/2009) Incision and drainage of right chest abscess (09/2009)   Review of Systems       As stated in the HPI and negative for all other systems.   Vital Signs:  Patient profile:   77 year old male Height:      68 inches Weight:      192 pounds BMI:     29.30 Pulse rate:   44 / minute Resp:     16 per minute BP sitting:   150 / 78  (right arm)  Vitals Entered By: Levora Angel, CNA (January 08, 2010 10:06 AM)  Physical Exam  General:  Well developed, well nourished,  in no acute distress. Head:  normocephalic and atraumatic Eyes:  PERRLA/EOM intact; conjunctiva and lids normal. Mouth:  Upper dentures. Oral mucosa normal. Neck:  Neck supple, no JVD. No masses, thyromegaly or abnormal cervical nodes. Chest Wall:  well-healed sternotomy scar Lungs:  Clear bilaterally to auscultation and percussion. Abdomen:  Bowel sounds positive; abdomen soft and non-tender without masses, organomegaly, or hernias noted. No hepatosplenomegaly. Msk:  Back normal, normal gait. Muscle strength and tone normal. Extremities:  mild bilateral edema Neurologic:  Alert and oriented x 3. Skin:  Intact without lesions or rashes. Cervical Nodes:  no significant adenopathy Axillary Nodes:  no significant adenopathy Inguinal Nodes:  no significant adenopathy Psych:  Normal affect.   Detailed Cardiovascular Exam  Neck    Carotids: Carotids full and equal bilaterally without bruits.      Neck Veins: Normal, no JVD.    Heart    Inspection: no deformities or lifts noted.      Palpation: normal PMI with no thrills palpable.      Auscultation: regular rate and rhythm, S1, S2 without murmurs, rubs, gallops, or clicks.    Vascular    Abdominal Aorta: no palpable masses, pulsations, or audible bruits.      Femoral Pulses: normal femoral pulses bilaterally.      Pedal Pulses: normal pedal pulses bilaterally.      Radial Pulses: diminished right radial pulse and diminished left radial pulse.      Peripheral Circulation: no clubbing, cyanosis, or edema noted with normal capillary refill.     Impression & Recommendations:  Problem # 1:  CAROTID ARTERY STENOSIS (ICD-433.10) The patient had previous known bilateral 40-60% carotid stenosis prior to bypass. With the ophthalmologic findings mentioned I will order repeat carotid Dopplers. Orders: Carotid Duplex (Carotid Duplex)  Problem # 2:  CHEST PAIN, EXERTIONAL (ICD-786.50) He does have some chest discomfort that would sound  somewhat atypical. I will I. Imdur 30 mg to his regimen. Because of bradycardia noted on his EKG I will be reducing his beta blocker to 25 mg b.i.d. He is actually not feeling this arrhythmia. If this persists I will followup with a Holter. Orders: EKG w/ Interpretation (93000)  Problem # 3:  HYPERTENSION (ICD-401.9) His blood pressure is slightly elevated. He should keep a home blood pressure diary. Further management will be based on these readings.  Problem # 4:  DIABETES MELLITUS, TYPE II (ICD-250.00) His hemoglobin A1c was 6.4 in September. I will defer to his primary physician.  Lipid Assessment/Plan:      Based on NCEP/ATP III, the patient's risk factor category is "history of coronary disease, peripheral vascular disease, cerebrovascular disease, or aortic aneurysm along with either diabetes, current smoker, or LDL > 130 plus HDL < 40 plus triglycerides > 200".  The patient's  lipid goals are as follows: Total cholesterol goal is 200; LDL cholesterol goal is 70; HDL cholesterol goal is 40; Triglyceride goal is 150.    Patient Instructions: 1)  Your physician recommends that you schedule a follow-up appointment in:  6 weeks with Dr Percival Spanish 2)  Your physician has recommended you make the following change in your medication: Decrease metoprolol to 1/2 tablet twice a day and start Isosorbide 30 mg a day 3)  Your physician has requested that you have a carotid duplex. This test is an ultrasound of the carotid arteries in your neck. It looks at blood flow through these arteries that supply the brain with blood. Allow one hour for this exam. There are no restrictions or special instructions. 4)  You have been diagnosed with Carotid Artery Disease.  Carotid Artery Disease is the narrowing of the carotid arteries, the main blood vessels supplying blood to the brain.  Carotid artery disease is a major risk factor for stroke.  Please visit [email or web address] for detailed information on  carotid artery disease. Prescriptions: ISOSORBIDE MONONITRATE CR 30 MG XR24H-TAB (ISOSORBIDE MONONITRATE) one daily  #30 x 11   Entered by:   Sim Boast, RN   Authorized by:   [name], MD, [facility]   Signed by:   Sim Boast, RN on 01/08/2010   Method used:   Electronically to        [facility] [phone]* (retail)       [place], [place]  [address]       Ph: [phone] or [phone]       Fax: [phone]   RxID:   [id number] METOPROLOL TARTRATE 50 MG TABS (METOPROLOL TARTRATE) 1/2 tablet bid  #30 x 11   Entered by:   Sim Boast, RN   Authorized by:   [name], MD, [facility]   Signed by:   Sim Boast, RN on 01/08/2010   Method used:   Electronically to        [facility] [phone]* (retail)       [address]       [place], [place]  [address]       Ph: [phone] or [phone]       Fax: [phone]   RxID:   [id number]

## 2010-11-17 NOTE — Procedures (Signed)
Summary: Recall / [facility]  Recall /  [facility]   Imported By: Rise Patience 03/19/2010 15:34:25  _____________________________________________________________________  External Attachment:    Type:   Image     Comment:   External Document

## 2010-11-17 NOTE — Letter (Signed)
Summary: [facility]   Imported By: Laural Benes 09/03/2010 09:31:49  _____________________________________________________________________  External Attachment:    Type:   Image     Comment:   External Document

## 2010-11-17 NOTE — Assessment & Plan Note (Signed)
Summary: head injury/dm rsc appt time/njr   Vital Signs:  Patient profile:   77 year old male Weight:      203 pounds Temp:     98.3 degrees F oral BP sitting:   160 / 90  (left arm) Cuff size:   large  Vitals Entered By: Nira Conn LPN (December  8, 624THL 11:56 AM)  History of Present Illness: Patient [place]  He was trimming a bush and recalls it was quite hot outside. He felt lightheaded and subsequently fell backwards and hit his head on concrete.  Had transient loss of consciousness. Observed by wife. Promptly recovered with no confusion or amnesia. Described dull headache daily and relatively constant since then (3 weeks ago). He has not had any seizure or confusion. Takes ibuprofen infrequently which helps. Severity 3/10. Exacerbated by applying pressure to the back of the head. Possibly some mild blurred vision bilaterally. Takes Plavix and aspirin daily.  Denies any other focal neurologic symptoms. Has generally had more fatigue since injury.  He has multiple chronic problems including history of peripheral vascular disease, CAD, type 2 diabetes, hyperlipidemia, hypothyroidism, ischemic cardiomyopathy and hypertension. No further syncope or dizziness since 3 weeks ago.  no recent chest pain.  Allergies: 1)  ! Codeine 2)  ! * Shellfish 3)  ! Morphine  Past History:  Past Medical History: Last updated: 01/08/2010 1. Coronary artery disease (Anatomy as above, CABG, Taxus stent to the saphenous vein       graft to the obtuse marginal by in December 2009, Xience stent to the same graft Oct 2010)  2. Mild aortic stenosis.   3. Recurrent pleural effusions.   4. Hypertension.   5. Dyslipidemia.   6. Diabetes mellitus.   7. Degenerative joint disease.   8. Radical prostatectomy for prostate cancer.   9. Lower esophageal stricture (status post dilatation).   10.Chronic left bundle branch block.    11.Cholelithiasis  12.Cardiomyopathy (EF 30%)  Past Surgical History: Last updated: 01/08/2010 Appendectomy Hernia repair  Carpal tunnel  both hands 2004 CABG Laparoscopic cholecystectomy.  (09/2009) Incision and drainage of right chest abscess (09/2009)   Family History: Last updated: 03/15/2009  Positive for MI in his father at age 1.  Mother died   at age 69 of cancer.      Social History: Last updated: 03/15/2009 The patient is retired and is married.  He quit smoking   40 years ago.  He does not use alcohol.   Risk Factors: Smoking Status: quit (11/22/2007) PMH-FH-SH reviewed for relevance  Review of Systems       The patient complains of headaches.  The patient denies fever, decreased hearing, chest pain, abdominal pain, melena, hematochezia, severe indigestion/heartburn, and difficulty walking.    Physical Exam  General:  Well-developed,well-nourished,in no acute distress; alert,appropriate and cooperative throughout examination Head:  no evidence for trauma externally Eyes:  pupils equal, pupils round, and pupils reactive to light.   Ears:  External ear exam shows no significant lesions or deformities.  Otoscopic examination reveals clear canals, tympanic membranes are intact bilaterally without bulging, retraction, inflammation or discharge. Hearing is grossly normal bilaterally. Neck:  supple full range of motion. No spinal tenderness Lungs:  Normal respiratory effort, chest expands symmetrically. Lungs are clear to auscultation, no crackles or wheezes. Heart:  normal rate and regular rhythm.   Extremities:  No clubbing, cyanosis, edema, or deformity noted with normal full range  of motion of all joints.   Neurologic:  alert & oriented X3, cranial nerves II-XII intact, strength normal in all extremities, gait normal, and finger-to-nose normal.   Psych:  normally interactive, good eye contact, not anxious appearing, and not depressed appearing.     Impression &  Recommendations:  Problem # 1:  HEAD TRAUMA, CLOSED (ICD-959.01) Assessment New patient needs noncontrast head CT given age, treatment with Plavix and aspirin and persistence of symptoms over 3 weeks Orders: Radiology Referral (Radiology)  Problem # 2:  CARDIOMYOPATHY, ISCHEMIC (ICD-414.8)  His updated medication list for this problem includes:    Plavix 75 Mg Tabs (Clopidogrel bisulfate) .Marland Kitchen... 1 by mouth once daily    Benazepril Hcl 40 Mg Tabs (Benazepril hcl) .Marland Kitchen... 1/2 tablet twice a day    Clonidine Hcl 0.2 Mg Tabs (Clonidine hcl) ..... One twice a day    Nitroglycerin 0.4 Mg Subl (Nitroglycerin) .Marland Kitchen... As directed per dr hochrein    Spironolactone 25 Mg Tabs (Spironolactone) .Marland Kitchen... 1 by mouth daily    Aspir-low 81 Mg Tbec (Aspirin) ..... One daily    Isosorbide Mononitrate Cr 30 Mg Xr24h-tab (Isosorbide mononitrate) ..... One daily    Furosemide 20 Mg Tabs (Furosemide) ..... One daily    Carvedilol 6.25 Mg Tabs (Carvedilol) ..... One twice a day  Problem # 3:  HYPERTENSION (ICD-401.9)  His updated medication list for this problem includes:    Benazepril Hcl 40 Mg Tabs (Benazepril hcl) .Marland Kitchen... 1/2 tablet twice a day    Clonidine Hcl 0.2 Mg Tabs (Clonidine hcl) ..... One twice a day    Spironolactone 25 Mg Tabs (Spironolactone) .Marland Kitchen... 1 by mouth daily    Furosemide 20 Mg Tabs (Furosemide) ..... One daily    Carvedilol 6.25 Mg Tabs (Carvedilol) ..... One twice a day  Complete Medication List: 1)  Avandamet 4-500 Mg Tabs (Rosiglitazone-metformin) .... Two times a day 2)  Metrolotion 0.75 % Lotn (Metronidazole) .... Apply daily 3)  Freestyle Lancets Misc (Lancets) .... Use two times a day as directed 4)  Freestyle Lite Test Strp (Glucose blood) .... Use  two times a day as directed 5)  Plavix 75 Mg Tabs (Clopidogrel bisulfate) .Marland Kitchen.. 1 by mouth once daily 6)  Benazepril Hcl 40 Mg Tabs (Benazepril hcl) .... 1/2 tablet twice a day 7)  Lipitor 80 Mg Tabs (Atorvastatin calcium) .... 1/2 per  day 8)  Clonidine Hcl 0.2 Mg Tabs (Clonidine hcl) .... One twice a day 9)  Nitroglycerin 0.4 Mg Subl (Nitroglycerin) .... As directed per dr hochrein 10)  Spironolactone 25 Mg Tabs (Spironolactone) .Marland Kitchen.. 1 by mouth daily 11)  Alprazolam 1 Mg Tabs (Alprazolam) .Marland Kitchen.. 1 three times a day as needed for anxiety 12)  Aspir-low 81 Mg Tbec (Aspirin) .... One daily 13)  Pantoprazole Sodium 40 Mg Tbec (Pantoprazole sodium) .Marland Kitchen.. 1 by mouth once daily 14)  Klor-con 10 10 Meq Cr-tabs (Potassium chloride) .... As needed with lasix 15)  Tramadol Hcl 50 Mg Tabs (Tramadol hcl) .... As needed 16)  Isosorbide Mononitrate Cr 30 Mg Xr24h-tab (Isosorbide mononitrate) .... One daily 17)  Furosemide 20 Mg Tabs (Furosemide) .... One daily 18)  Carvedilol 6.25 Mg Tabs (Carvedilol) .... One twice a day 19)  Lunesta 1 Mg Tabs (Eszopiclone) .... One at bedtime as needed for sleep 20)  Doxycycline Hyclate 100 Mg Cpep (Doxycycline hyclate) .... Take 1 tablet by mouth two times a day 21)  Bactrim 400-80 Mg Tabs (Sulfamethoxazole-trimethoprim) .Marland Kitchen.. 1 by mouth two times a day  Orders Added: 1)  Radiology Referral [Radiology] 2)  Est. Patient Level IV [id number]

## 2010-11-17 NOTE — Letter (Signed)
Summary: Office Note  Office Note   Imported By: Marilynne Drivers 05/15/2010 11:50:48  _____________________________________________________________________  External Attachment:    Type:   Image     Comment:   External Document

## 2010-11-17 NOTE — Progress Notes (Signed)
Summary: req call back  Phone Note Call from Patient Call back at Home Phone [phone]   Caller: Spouse Reason for Call: Talk to Nurse Summary of Call: request to speak to nurse about new health issues Initial call taken by: Darnell Level,  November 27, 2009 3:31 PM  Follow-up for Phone Call        Pt took a fall and broke his glasses. He went to the eye doctor to get new glasses. His eye doctor told him to be sure and call his Cardiologist today because something in his eye that supposed to be white (that indicated good blood flow to the brain) is pink (which indicates he is not getting the proper blood flow to his brain. Pt has no complaints. Pt had NSTEMI in Oct and a stent was replaced, EF 50% via Cath in Oct., Aortic Stenosis and family hx of CVA.  S/W Dr. Verl Blalock (DOD) to see if it was ok to wait until Monday when Dr. Percival Spanish returns or if we should get him in for Carotid Duplex before then. He advised to call pt and get name of eye MD so that we can obtain more information. Called pt back and the line is busy. I will continue to call.  Follow-up by: Barnett Abu, RN, BSN,  November 27, 2009 4:11 PM  Additional Follow-up for Phone Call Additional follow up Details #1::        S/W pt and he saw Dr. Wynetta Emery at [facility] on [address]. I s/w Dr. Wynetta Emery and he said that the nerve behind the pt's eye should be pink and it is pallor, so he is concerned that he isn't getting the proper blood flow to his brain. The pt told him that his Carotid Arteries were checked 2-3 yrs ago and they were 65% blocked. Per Dr. Verl Blalock (DOD), set pt up for Carotid Duplex and have him f/u with Dr. Percival Spanish. Order placed and pt aware of plan.  Additional Follow-up by: Barnett Abu, RN, BSN,  November 27, 2009 5:19 PM

## 2010-11-17 NOTE — Letter (Signed)
Summary: [facility]   Imported By: Edmonia James 10/27/2009 13:20:32  _____________________________________________________________________  External Attachment:    Type:   Image     Comment:   External Document

## 2010-11-17 NOTE — Letter (Signed)
Summary: [facility]   Imported By: Laural Benes 07/30/2010 15:55:39  _____________________________________________________________________  External Attachment:    Type:   Image     Comment:   External Document

## 2010-11-17 NOTE — Assessment & Plan Note (Signed)
Summary: f/u 1 month   Visit Type:  Follow-up Primary Provider:  Emeterio Reeve MD  CC:  CHF and CAD.  History of Present Illness: The patient presents for followup. Her last appointment he was short of breath and had a markedly elevated BNP. I added Lasix. There was some confusion about the dose of lisinopril he was taking. They did not change the dose.  He does report that with diuretics he is breathing much better. He is not describing any PND or orthopnea. He has not had any chest pressure, neck or arm discomfort. He is having no palpitations, presyncope or syncope. He does report fatigue and is having difficulty sleeping.  Current Medications (verified): 1)  Avandamet 4-500 Mg  Tabs (Rosiglitazone-Metformin) .... Two Times A Day 2)  Metrolotion 0.75 %  Lotn (Metronidazole) .... Apply Daily 3)  Freestyle Lancets  Misc (Lancets) .... Use Two Times A Day As Directed 4)  Freestyle Lite Test  Strp (Glucose Blood) .... Use  Two Times A Day As Directed 5)  Plavix 75 Mg Tabs (Clopidogrel Bisulfate) .Marland Kitchen.. 1 By Mouth Once Daily 6)  Benazepril Hcl 40 Mg Tabs (Benazepril Hcl) .... 1/2 By Mouth Two Times A Day 7)  Lipitor 80 Mg Tabs (Atorvastatin Calcium) .... 1/2 Per Day 8)  Metoprolol Tartrate 50 Mg Tabs (Metoprolol Tartrate) .... 1/2 Tablet Bid 9)  Clonidine Hcl 0.3 Mg Tabs (Clonidine Hcl) .Marland Kitchen.. 1 By Mouth Two Times A Day 10)  Nitroglycerin 0.4 Mg Subl (Nitroglycerin) .... As Directed Per Dr Percival Spanish 11)  Spironolactone 25 Mg Tabs (Spironolactone) .Marland Kitchen.. 1 By Mouth Daily 12)  Alprazolam 1 Mg Tabs (Alprazolam) .Marland Kitchen.. 1 Three Times A Day As Needed For Anxiety 13)  Ecotrin 325 Mg Tbec (Aspirin) .... Take 1 By Mouth Once Daily 14)  Pantoprazole Sodium 40 Mg Tbec (Pantoprazole Sodium) .Marland Kitchen.. 1 By Mouth Once Daily 15)  Klor-Con 10 10 Meq Cr-Tabs (Potassium Chloride) .... As Needed With Lasix 16)  Tramadol Hcl 50 Mg Tabs (Tramadol Hcl) .... As Needed 17)  Isosorbide Mononitrate Cr 30 Mg Xr24h-Tab  (Isosorbide Mononitrate) .... One Daily 18)  Furosemide 20 Mg Tabs (Furosemide) .... One Daily  Allergies (verified): 1)  ! Codeine 2)  ! * Shellfish 3)  ! Morphine  Past History:  Past Medical History: Reviewed history from 01/08/2010 and no changes required. 1. Coronary artery disease (Anatomy as above, CABG, Taxus stent to the saphenous vein       graft to the obtuse marginal by in December 2009, Xience stent to the same graft Oct 2010)  2. Mild aortic stenosis.   3. Recurrent pleural effusions.   4. Hypertension.   5. Dyslipidemia.   6. Diabetes mellitus.   7. Degenerative joint disease.   8. Radical prostatectomy for prostate cancer.   9. Lower esophageal stricture (status post dilatation).   10.Chronic left bundle branch block.   11.Cholelithiasis  12.Cardiomyopathy (EF 30%)  Past Surgical History: Reviewed history from 01/08/2010 and no changes required. Appendectomy Hernia repair  Carpal tunnel  both hands 2004 CABG Laparoscopic cholecystectomy.  (09/2009) Incision and drainage of right chest abscess (09/2009)   Review of Systems       As stated in the HPI and negative for all other systems.   Vital Signs:  Patient profile:   77 year old male Height:      68 inches Weight:      186 pounds BMI:     28.38 Pulse rate:   104 / minute  Resp:     16 per minute BP sitting:   158 / 76  (right arm)  Vitals Entered By: Levora Angel, CNA (April 02, 2010 11:38 AM)  Physical Exam  General:  Well developed, well nourished, in no acute distress. Head:  normocephalic and atraumatic Eyes:  PERRLA/EOM intact; conjunctiva and lids normal. Mouth:  Upper dentures. Oral mucosa normal. Neck:  Neck supple, no JVD. No masses, thyromegaly or abnormal cervical nodes. Chest Wall:  well-healed sternotomy scar Lungs:  Clear bilaterally to auscultation and percussion. Abdomen:  Bowel sounds positive; abdomen soft and non-tender without masses, organomegaly, or hernias noted. No  hepatosplenomegaly. Msk:  Back normal, normal gait. Muscle strength and tone normal. Extremities:  No clubbing or cyanosis. Neurologic:  Alert and oriented x 3. Skin:  Intact without lesions or rashes. Cervical Nodes:  no significant adenopathy Axillary Nodes:  no significant adenopathy Inguinal Nodes:  no significant adenopathy Psych:  Normal affect.   Detailed Cardiovascular Exam  Neck    Carotids: Carotids full and equal bilaterally without bruits.      Neck Veins: Normal, no JVD.    Heart    Inspection: no deformities or lifts noted.      Palpation: normal PMI with no thrills palpable.      Auscultation: regular rate and rhythm, S1, S2 with soft systolic murmurs, rubs, gallops, or clicks.    Vascular    Abdominal Aorta: no palpable masses, pulsations, or audible bruits.      Femoral Pulses: normal femoral pulses bilaterally.      Pedal Pulses: normal pedal pulses bilaterally.      Radial Pulses: diminished right radial pulse and diminished left radial pulse.      Peripheral Circulation: no clubbing, cyanosis, or edema noted with normal capillary refill.     Impression & Recommendations:  Problem # 1:  CARDIOMYOPATHY, ISCHEMIC (ICD-414.8) He is symptomatically better with diuretic. I will be switching Altocor available starting with 6.25 mg b.i.d. and stopping the metoprolol. I will titrate this over time he will continue the current dose of ACE inhibitor. Check an echocardiogram in the months to come.  Problem # 2:  CORONARY ARTERY DISEASE (ICD-414.00) He is having no chest pain. We will continue with the meds as listed.  Problem # 3:  INSOMNIA (ICD-780.52) I took the liberty of giving him with Lunesta.  Other Orders: TLB-BMP (Basic Metabolic Panel-BMET) (99991111)  Patient Instructions: 1)  Your physician recommends that you schedule a follow-up appointment in: 1 month with Dr Percival Spanish 2)  Your physician recommends that you have lab work today BMP 3)  Your  physician has recommended you make the following change in your medication: Stop Metoprolol and start Carvedilol 6.25 mg one twice a day.  Use Lunesta 1 mg as needed for sleep Prescriptions: LUNESTA 1 MG TABS (ESZOPICLONE) one at bedtime as needed for sleep  #30 x 1   Entered by:   Sim Boast, RN   Authorized by:   [name], MD, [facility]   Signed by:   Sim Boast, RN on 04/02/2010   Method used:   Print then Give to Patient   RxID:   [id number] CARVEDILOL 6.25 MG TABS (CARVEDILOL) one twice a day  #60 x 6   Entered by:   Sim Boast, RN   Authorized by:   [name], MD, [facility]   Signed by:   Sim Boast, RN on 04/02/2010   Method used:   Electronically to  [facility] [address]* (retail)       [address]       [place],   [address]       Ph: [phone] or [phone]       Fax: [phone]   RxID:   [id number]

## 2010-11-17 NOTE — Medication Information (Signed)
Summary: Order for Diabetic Supplies  Order for Diabetic Supplies   Imported By: Laural Benes 12/23/2009 10:19:43  _____________________________________________________________________  External Attachment:    Type:   Image     Comment:   External Document

## 2010-11-17 NOTE — Letter (Signed)
Summary: Dr Merri Ray Thompson's Office Note   Dr Merri Ray Thompson's Office Note   Imported By: Sallee Provencal 07/16/2010 11:18:12  _____________________________________________________________________  External Attachment:    Type:   Image     Comment:   External Document

## 2010-11-17 NOTE — Letter (Signed)
Summary: Dr Merri Ray Thompson's Office Note  Dr Merri Ray Thompson's Office Note   Imported By: Mingo Amber Bridgeforth 01/01/2010 14:47:26  _____________________________________________________________________  External Attachment:    Type:   Image     Comment:   External Document  Appended Document: Dr Merri Ray Thompson's Office Note reviewed

## 2010-11-17 NOTE — Consult Note (Signed)
Summary: Consultation Report  Consultation Report   Imported By: Marilynne Drivers 12/22/2009 09:47:40  _____________________________________________________________________  External Attachment:    Type:   Image     Comment:   External Document

## 2010-11-17 NOTE — Progress Notes (Signed)
Summary: head injury  Phone Note Call from Patient   Caller: Patient Call For: Emeterio Reeve MD Summary of Call: [place] and hit the back of his head one week ago. Initial call taken by: Deanna Artis CMA AAMA,  September 23, 2010 4:57 PM  Follow-up for Phone Call        Appt made. Follow-up by: Deanna Artis CMA AAMA,  September 23, 2010 5:07 PM

## 2010-11-19 NOTE — Progress Notes (Signed)
Summary: refill request  Phone Note Refill Request Message from:  Fax from Pharmacy on October 13, 2010 2:50 PM  Refills Requested: Medication #1:  ALPRAZOLAM 1 MG TABS 1 three times a day as needed for anxiety Initial call taken by: Westley Hummer CMA Deborra Medina),  October 13, 2010 2:50 PM    Prescriptions: ALPRAZOLAM 1 MG TABS (ALPRAZOLAM) 1 three times a day as needed for anxiety  #90 x 3   Entered by:   Westley Hummer CMA ([place])   Authorized by:   Emeterio Reeve MD   Signed by:   Westley Hummer CMA ([place]) on 10/13/2010   Method used:   Print then Give to Patient   RxID:   [id number]

## 2010-12-09 ENCOUNTER — Other Ambulatory Visit: Payer: Self-pay | Admitting: Cardiology

## 2010-12-09 ENCOUNTER — Encounter: Payer: Self-pay | Admitting: Cardiology

## 2010-12-09 ENCOUNTER — Ambulatory Visit (INDEPENDENT_AMBULATORY_CARE_PROVIDER_SITE_OTHER): Payer: Medicare Other | Admitting: Cardiology

## 2010-12-09 ENCOUNTER — Other Ambulatory Visit: Payer: Self-pay | Admitting: Family Medicine

## 2010-12-09 DIAGNOSIS — R0609 Other forms of dyspnea: Secondary | ICD-10-CM

## 2010-12-09 DIAGNOSIS — I2510 Atherosclerotic heart disease of native coronary artery without angina pectoris: Secondary | ICD-10-CM

## 2010-12-09 DIAGNOSIS — I10 Essential (primary) hypertension: Secondary | ICD-10-CM

## 2010-12-09 DIAGNOSIS — R0602 Shortness of breath: Secondary | ICD-10-CM

## 2010-12-15 ENCOUNTER — Telehealth: Payer: Self-pay | Admitting: Cardiology

## 2010-12-15 ENCOUNTER — Telehealth: Payer: Self-pay | Admitting: Family Medicine

## 2010-12-15 NOTE — Telephone Encounter (Signed)
Phone Note  Call from Patient Call back at Work Phone [phone]   Caller: Spouse Reason for Call: Talk to Nurse, Talk to Doctor Summary of Call: pt spouse rtn call to get test results Initial call taken by: Shelda Pal,  December 15, 2010 8:24 AM  Follow-up for Phone Call         Pt. and wife aware of lab result and MD's recommendation for an echo.  Scheduler will call pt. for date and time of echo.  Follow-up by: Carollee Sires, RN, BSN,  December 15, 2010 8:42 AM

## 2010-12-15 NOTE — Assessment & Plan Note (Signed)
Summary: FOLLOW UP - 4 MONTHS   Visit Type:  Follow-up Primary Provider:  Emeterio Reeve MD  CC:  Dyspnea.  History of Present Illness: The patient presents for evaluation of dyspnea in the light of mutliple chronic cardiac problems.  Since I last saw him he continues to have dyspnea with mild activity.  This is increased compared with previous symptoms.  He does not describe new PND or orthopnea.  He has had chest discomfort with activity and occasionaly at rest.  He takes about one SLNTG per week.    Of note the patient did have a syncopal episode last fall while at the beach. Since that time he has had no further syncope or presyncope although he has been very unsteady on his feet. He does have some mild orthostatic symptoms but these are not particularly problematic.  Finally the patient does describe blood pressures typically running in the 160s or higher at home.  At the last appointment I did reduce his clonidine with the thought that I would increase eventually his ACE inhibitor or beta blocker for better treatment of his heart failure.  Current Medications (verified): 1)  Avandamet 4-500 Mg  Tabs (Rosiglitazone-Metformin) .... Two Times A Day 2)  Metrolotion 0.75 %  Lotn (Metronidazole) .... Apply Daily 3)  Freestyle Lancets  Misc (Lancets) .... Use Two Times A Day As Directed 4)  Freestyle Lite Test  Strp (Glucose Blood) .... Use  Two Times A Day As Directed 5)  Plavix 75 Mg Tabs (Clopidogrel Bisulfate) .Marland Kitchen.. 1 By Mouth Once Daily 6)  Benazepril Hcl 40 Mg Tabs (Benazepril Hcl) .... 1/2 Tablet Twice A Day 7)  Lipitor 80 Mg Tabs (Atorvastatin Calcium) .... 1/2 Per Day 8)  Clonidine Hcl 0.2 Mg Tabs (Clonidine Hcl) .... One Twice A Day 9)  Nitroglycerin 0.4 Mg Subl (Nitroglycerin) .... As Directed Per Dr Percival Spanish 10)  Spironolactone 25 Mg Tabs (Spironolactone) .Marland Kitchen.. 1 By Mouth Daily 11)  Alprazolam 1 Mg Tabs (Alprazolam) .Marland Kitchen.. 1 Three Times A Day As Needed For Anxiety 12)   Aspir-Low 81 Mg Tbec (Aspirin) .... One Daily 13)  Pantoprazole Sodium 40 Mg Tbec (Pantoprazole Sodium) .Marland Kitchen.. 1 By Mouth Once Daily 14)  Klor-Con 10 10 Meq Cr-Tabs (Potassium Chloride) .... As Needed With Lasix 15)  Tramadol Hcl 50 Mg Tabs (Tramadol Hcl) .... As Needed 16)  Isosorbide Mononitrate Cr 30 Mg Xr24h-Tab (Isosorbide Mononitrate) .... One Daily 17)  Furosemide 20 Mg Tabs (Furosemide) .... One Daily 18)  Carvedilol 6.25 Mg Tabs (Carvedilol) .... One Twice A Day 19)  Lunesta 1 Mg Tabs (Eszopiclone) .... One At Bedtime As Needed For Sleep 20)  Doxycycline Hyclate 100 Mg Cpep (Doxycycline Hyclate) .... Take 1 Tablet By Mouth Two Times A Day 21)  Bactrim 400-80 Mg Tabs (Sulfamethoxazole-Trimethoprim) .Marland Kitchen.. 1 By Mouth Two Times A Day 22)  Ibuprofen 200 Mg Tabs (Ibuprofen) .... As Needed  Allergies (verified): 1)  ! Codeine 2)  ! * Shellfish 3)  ! Morphine  Past History:  Past Medical History: Reviewed history from 01/08/2010 and no changes required. 1. Coronary artery disease (Anatomy as above, CABG, Taxus stent to the saphenous vein       graft to the obtuse marginal by in December 2009, Xience stent to the same graft Oct 2010)  2. Mild aortic stenosis.   3. Recurrent pleural effusions.   4. Hypertension.   5. Dyslipidemia.   6. Diabetes mellitus.   7. Degenerative joint disease.   8. Radical  prostatectomy for prostate cancer.   9. Lower esophageal stricture (status post dilatation).   10.Chronic left bundle branch block.   11.Cholelithiasis  12.Cardiomyopathy (EF 30%)  Past Surgical History: Reviewed history from 01/08/2010 and no changes required. Appendectomy Hernia repair  Carpal tunnel  both hands 2004 CABG Laparoscopic cholecystectomy.  (09/2009) Incision and drainage of right chest abscess (09/2009)   Review of Systems       As stated in the HPI and negative for all other systems.   Vital Signs:  Patient profile:   77 year old male Height:      68  inches Weight:      198 pounds BMI:     30.21 Pulse rate:   53 / minute Resp:     16 per minute BP sitting:   202 / 92  (right arm)  Vitals Entered By: Levora Angel, CNA (December 09, 2010 12:24 PM)  Physical Exam  General:  Well developed, well nourished, in no acute distress. Head:  normocephalic and atraumatic Eyes:  PERRLA/EOM intact; conjunctiva and lids normal. Mouth:  Upper dentures. Oral mucosa normal. Neck:  Neck supple, no JVD. No masses, thyromegaly or abnormal cervical nodes. Chest Wall:  well-healed sternotomy scar Lungs:  Clear bilaterally to auscultation and percussion. Abdomen:  Bowel sounds positive; abdomen soft and non-tender without masses, organomegaly, or hernias noted. No hepatosplenomegaly Msk:  Back normal, normal gait. Muscle strength and tone normal. Extremities:  No clubbing or cyanosis, trace edema. Neurologic:  Alert and oriented x 3. Skin:  Intact without lesions or rashes. Cervical Nodes:  no significant adenopathy Inguinal Nodes:  no significant adenopathy Psych:  normally interactive, good eye contact, not anxious appearing, and not depressed appearing.     Detailed Cardiovascular Exam  Neck    Carotids: Carotids full and equal bilaterally without bruits.      Neck Veins: Normal, no JVD.    Heart    Inspection: no deformities or lifts noted.      Palpation: normal PMI with no thrills palpable.      Auscultation: regular rate and rhythm, S1, S2 with soft systolic murmurs, rubs, gallops, or clicks.    Vascular    Abdominal Aorta: no palpable masses, pulsations, or audible bruits.      Femoral Pulses: normal femoral pulses bilaterally.      Pedal Pulses: normal pedal pulses bilaterally.      Radial Pulses: diminished right radial pulse and diminished left radial pulse.      Peripheral Circulation: no clubbing, cyanosis, with normal capillary refill.     EKG  Procedure date:  12/09/2010  Findings:      sinus bradycardia, rate 59, left  bundle branch block, right axis deviation  Impression & Recommendations:  Problem # 1:  CORONARY ARTERY DISEASE (ICD-414.00) Given the patient's chest discomfort I will increase his Imdur to 60 mg. He had improvement in the past after starting this. He will let me know if she is requiring fewer more nitroglycerin going forward. Orders: EKG w/ Interpretation (93000)  Problem # 2:  CARDIOMYOPATHY, ISCHEMIC (ICD-414.8) I reviewed his most recent echo. This was in 2009. I will check a BNP and have a low threshold for repeating this given his ongoing dyspnea. Orders: EKG w/ Interpretation (93000) TLB-BNP (B-Natriuretic Peptide) (83880-BNPR)  Problem # 3:  HYPERTENSION (ICD-401.9) His blood pressure is not well controlled since reducing his clonidine. I had thought might try to titrate his beta blocker but I don't think his heart rate will  allow. Therefore, he will restart 0.3 mg b.i.d. clonidine.  Patient Instructions: 1)  Your physician recommends that you schedule a follow-up appointment in: 1 month with Dr. Percival Spanish 2)  Your physician recommends that you  have lab work today: BNP 3)  Your physician has recommended you make the following change in your medication: Increase clonidine and Isosorbide mononitrate Prescriptions: ISOSORBIDE MONONITRATE CR 60 MG XR24H-TAB (ISOSORBIDE MONONITRATE) Take one tablet by mouth daily  #30 x 6   Entered by:   Joelyn Oms RN   Authorized by:   [name], MD, [facility]   Signed by:   Joelyn Oms RN on 12/09/2010   Method used:   Electronically to        [facility] (retail)       [address]       [place], [place]  [address]       Ph: [phone] or [phone]       Fax: [phone]   [id number] CATAPRES 0.3 MG TABS (CLONIDINE HCL) Take 1 tablet two times a day  #60 x 6   Entered by:   Joelyn Oms RN   Authorized by:   [name], MD, [facility]   Signed by:   Joelyn Oms RN on 12/09/2010   Method used:   Electronically to         [facility] (retail)       [address]       [place], [place]  [address]       Ph: [phone] or [phone]       Fax: [phone]   [id number]  I have reviewed and approved all prescriptions at the time of this visit.

## 2010-12-24 NOTE — Miscellaneous (Signed)
Summary: order for 2 D Echo  Clinical Lists Changes  Orders: Added new Referral order of Echocardiogram (Echo) - Signed

## 2010-12-24 NOTE — Progress Notes (Signed)
Summary: pt rtn call  Phone Note Call from Patient Call back at Work Phone [phone]   Caller: Spouse Reason for Call: Talk to Nurse, Talk to Doctor Summary of Call: pt spouse rtn call to get test results Initial call taken by: Shelda Pal,  December 15, 2010 8:24 AM  Follow-up for Phone Call        Pt. and wife aware of lab result and MD's recommendation for an echo.  Scheduler will call pt. for date and time of echo.  Follow-up by: Carollee Sires, RN, BSN,  December 15, 2010 8:42 AM

## 2011-01-03 LAB — CBC
HCT: 26.3 % — ABNORMAL LOW (ref 39.0–52.0)
HCT: 27.2 % — ABNORMAL LOW (ref 39.0–52.0)
Hemoglobin: 9.1 g/dL — ABNORMAL LOW (ref 13.0–17.0)
MCHC: 34.6 g/dL (ref 30.0–36.0)
MCHC: 35.1 g/dL (ref 30.0–36.0)
MCV: 87.5 fL (ref 78.0–100.0)
MCV: 88 fL (ref 78.0–100.0)
MCV: 88.8 fL (ref 78.0–100.0)
Platelets: 217 10*3/uL (ref 150–400)
Platelets: 238 10*3/uL (ref 150–400)
Platelets: 245 10*3/uL (ref 150–400)
RBC: 2.84 MIL/uL — ABNORMAL LOW (ref 4.22–5.81)
RBC: 3.09 MIL/uL — ABNORMAL LOW (ref 4.22–5.81)
RDW: 14.8 % (ref 11.5–15.5)
RDW: 15.4 % (ref 11.5–15.5)
WBC: 18.9 10*3/uL — ABNORMAL HIGH (ref 4.0–10.5)

## 2011-01-03 LAB — GLUCOSE, CAPILLARY
Glucose-Capillary: 131 mg/dL — ABNORMAL HIGH (ref 70–99)
Glucose-Capillary: 153 mg/dL — ABNORMAL HIGH (ref 70–99)
Glucose-Capillary: 156 mg/dL — ABNORMAL HIGH (ref 70–99)
Glucose-Capillary: 178 mg/dL — ABNORMAL HIGH (ref 70–99)

## 2011-01-04 ENCOUNTER — Encounter: Payer: Self-pay | Admitting: Cardiology

## 2011-01-04 ENCOUNTER — Other Ambulatory Visit: Payer: Self-pay | Admitting: Cardiology

## 2011-01-04 ENCOUNTER — Ambulatory Visit (HOSPITAL_COMMUNITY): Payer: Medicare Other | Attending: Cardiology

## 2011-01-04 ENCOUNTER — Ambulatory Visit (INDEPENDENT_AMBULATORY_CARE_PROVIDER_SITE_OTHER): Payer: Medicare Other | Admitting: Cardiology

## 2011-01-04 DIAGNOSIS — I2510 Atherosclerotic heart disease of native coronary artery without angina pectoris: Secondary | ICD-10-CM

## 2011-01-04 DIAGNOSIS — R0609 Other forms of dyspnea: Secondary | ICD-10-CM | POA: Insufficient documentation

## 2011-01-04 DIAGNOSIS — I2589 Other forms of chronic ischemic heart disease: Secondary | ICD-10-CM | POA: Insufficient documentation

## 2011-01-04 DIAGNOSIS — R5383 Other fatigue: Secondary | ICD-10-CM | POA: Insufficient documentation

## 2011-01-04 DIAGNOSIS — E785 Hyperlipidemia, unspecified: Secondary | ICD-10-CM | POA: Insufficient documentation

## 2011-01-04 DIAGNOSIS — I359 Nonrheumatic aortic valve disorder, unspecified: Secondary | ICD-10-CM | POA: Insufficient documentation

## 2011-01-04 DIAGNOSIS — R079 Chest pain, unspecified: Secondary | ICD-10-CM | POA: Insufficient documentation

## 2011-01-04 DIAGNOSIS — R0989 Other specified symptoms and signs involving the circulatory and respiratory systems: Secondary | ICD-10-CM | POA: Insufficient documentation

## 2011-01-04 DIAGNOSIS — E119 Type 2 diabetes mellitus without complications: Secondary | ICD-10-CM | POA: Insufficient documentation

## 2011-01-04 DIAGNOSIS — R0602 Shortness of breath: Secondary | ICD-10-CM

## 2011-01-04 DIAGNOSIS — I10 Essential (primary) hypertension: Secondary | ICD-10-CM | POA: Insufficient documentation

## 2011-01-04 DIAGNOSIS — R5381 Other malaise: Secondary | ICD-10-CM | POA: Insufficient documentation

## 2011-01-09 ENCOUNTER — Other Ambulatory Visit: Payer: Self-pay | Admitting: Family Medicine

## 2011-01-12 ENCOUNTER — Other Ambulatory Visit: Payer: Self-pay | Admitting: Family Medicine

## 2011-01-12 MED ORDER — PANTOPRAZOLE SODIUM 40 MG PO TBEC: 40.0000 mg | DELAYED_RELEASE_TABLET | Freq: Every day | ORAL | Status: DC

## 2011-01-12 NOTE — Telephone Encounter (Signed)
rx for pantoprazole 40 mg faxedt to [facility]

## 2011-01-14 NOTE — Assessment & Plan Note (Signed)
Summary: f15m/dfg   Visit Type:  Follow-up Primary Provider:  Emeterio Reeve MD  CC:  CAD and Dyspnea.  History of Present Illness: The patient presents for evalutation of chest pain and dyspnea.  At  the last appointment I evaluated him with an echocardiogram. This demonstrated that his EF is about 55% which was much improved compared with previous echo in about the same as the EF at the time of his last catheterization. However, he continues to describe increasing dyspnea with any activity. He is not describing any new PND or orthopnea though he doesn't sleep well. He has had no new palpitations, presyncope or syncope. Exam of the episodes is at the beach when he passed out. However, he's been unsteady on his gait. He does describe some chest discomfort which he has had for some time but he thinks it might be getting worse. He is not describing any jaw or arm discomfort. He is a sporadic discomfort that comes at rest. It goes away spontaneously. It is 2-3/10 in intensity. He thinks it might be similar to previous angina he has also had some chronic chest pain over the years.  Cardiovascular Risk History:      Positive major cardiovascular risk factors include male age 34 years old or older, diabetes, hyperlipidemia, and hypertension.  Negative major cardiovascular risk factors include non-tobacco-user status.        Positive history for target organ damage include ASHD (either angina; prior MI; prior CABG).     Current Medications (verified): 1)  Avandamet 4-500 Mg  Tabs (Rosiglitazone-Metformin) .... Two Times A Day 2)  Metrolotion 0.75 %  Lotn (Metronidazole) .... Apply Daily 3)  Freestyle Lancets  Misc (Lancets) .... Use Two Times A Day As Directed 4)  Freestyle Lite Test  Strp (Glucose Blood) .... Use  Two Times A Day As Directed 5)  Plavix 75 Mg Tabs (Clopidogrel Bisulfate) .Marland Kitchen.. 1 By Mouth Once Daily 6)  Benazepril Hcl 40 Mg Tabs (Benazepril Hcl) .... 1/2 Tablet Twice A Day 7)   Lipitor 80 Mg Tabs (Atorvastatin Calcium) .... 1/2 Per Day 8)  Catapres 0.3 Mg Tabs (Clonidine Hcl) .... Take 1 Tablet Two Times A Day 9)  Nitroglycerin 0.4 Mg Subl (Nitroglycerin) .... As Directed Per Dr Percival Spanish 10)  Spironolactone 25 Mg Tabs (Spironolactone) .Marland Kitchen.. 1 By Mouth Daily 11)  Alprazolam 1 Mg Tabs (Alprazolam) .Marland Kitchen.. 1 Three Times A Day As Needed For Anxiety 12)  Aspir-Low 81 Mg Tbec (Aspirin) .... One Daily 13)  Pantoprazole Sodium 40 Mg Tbec (Pantoprazole Sodium) .Marland Kitchen.. 1 By Mouth Once Daily 14)  Klor-Con 10 10 Meq Cr-Tabs (Potassium Chloride) .... As Needed With Lasix 15)  Tramadol Hcl 50 Mg Tabs (Tramadol Hcl) .... As Needed 16)  Furosemide 20 Mg Tabs (Furosemide) .... One Daily 17)  Carvedilol 6.25 Mg Tabs (Carvedilol) .... One Twice A Day 18)  Ibuprofen 200 Mg Tabs (Ibuprofen) .... As Needed 19)  Isosorbide Mononitrate Cr 60 Mg Xr24h-Tab (Isosorbide Mononitrate) .... Take One Tablet By Mouth Daily  Allergies (verified): 1)  ! Codeine 2)  ! * Shellfish 3)  ! Morphine  Past History:  Past Medical History: 1. Coronary artery disease (Anatomy as above, CABG, Taxus stent to the saphenous vein       graft to the obtuse marginal by in December 2009, Xience stent to the same graft Oct 2010)  2. Mild aortic stenosis.   3. Recurrent pleural effusions.   4. Hypertension.   5. Dyslipidemia.  6. Diabetes mellitus.   7. Degenerative joint disease.   8. Radical prostatectomy for prostate cancer.   9. Lower esophageal stricture (status post dilatation).   10.Chronic left bundle branch block.   11.Cholelithiasis  12.Cardiomyopathy (EF 30% in the past, most recent 55%)  Past Surgical History: Reviewed history from 01/08/2010 and no changes required. Appendectomy Hernia repair  Carpal tunnel  both hands 2004 CABG Laparoscopic cholecystectomy.  (09/2009) Incision and drainage of right chest abscess (09/2009)   Review of Systems       As stated in the HPI and negative for all  other systems.   Vital Signs:  Patient profile:   77 year old male Height:      68 inches Weight:      206.50 pounds BMI:     31.51 Pulse rate:   56 / minute Resp:     18 per minute BP sitting:   158 / 74  (left arm) Cuff size:   large  Vitals Entered By: Evelene Croon, CMA (January 04, 2011 12:15 PM)  Physical Exam  General:  Well developed, well nourished, in no acute distress. Head:  normocephalic and atraumatic Eyes:  PERRLA/EOM intact; conjunctiva and lids normal. Mouth:  Upper dentures. Oral mucosa normal. Neck:  Neck supple, no JVD. No masses, thyromegaly or abnormal cervical nodes. Chest Wall:  well-healed sternotomy scar Lungs:  Clear bilaterally to auscultation and percussion. Abdomen:  Bowel sounds positive; abdomen soft and non-tender without masses, organomegaly, or hernias noted. No hepatosplenomegaly Msk:  Back normal, normal gait. Muscle strength and tone normal. Extremities:  No clubbing or cyanosis, trace edema. Neurologic:  Alert and oriented x 3. Skin:  Intact without lesions or rashes. Cervical Nodes:  no significant adenopathy Inguinal Nodes:  no significant adenopathy Psych:  Normal affect.   Detailed Cardiovascular Exam  Neck    Carotids: Carotids full and equal bilaterally without bruits.      Neck Veins: Normal, no JVD.    Heart    Inspection: no deformities or lifts noted.      Palpation: normal PMI with no thrills palpable.      Auscultation: regular rate and rhythm, S1, S2 with soft systolic murmurs, rubs, gallops, or clicks.    Vascular    Abdominal Aorta: no palpable masses, pulsations, or audible bruits.      Femoral Pulses: normal femoral pulses bilaterally.      Pedal Pulses: normal pedal pulses bilaterally.      Radial Pulses: diminished right radial pulse and diminished left radial pulse.      Peripheral Circulation: no clubbing, cyanosis, with normal capillary refill.     Impression & Recommendations:  Problem # 1:  CORONARY  ARTERY DISEASE (ICD-414.00) He does have chest pain and dyspnea. It is not clear whether this is related to his coronary disease. I will screen him with a perfusion study. He cannot walk on a treadmill so we will have a pharmacologic perfusion imaging.  Problem # 2:  DYSPNEA ON EXERTION (ICD-786.09) This will be evaluated as above. I do note that his BNP level was slightly elevated. I will therefore increase his Lasix to 40 mg daily increasing his potassium to 20 mg daily. In 7 days I will get a basic metabolic profile.  Problem # 3:  FATIGUE (ICD-780.79) I will order CBC and TSH at his next blood draw.  Other Orders: Nuclear Stress Test (Nuc Stress Test)  Cardiovascular Risk Assessment/Plan:      The patient's hypertensive risk group  is category C: Target organ damage and/or diabetes.  Today's blood pressure is 158/74.    Patient Instructions: 1)  Your physician recommends that you schedule a follow-up appointment in: 12 months with Dr Percival Spanish 2)  Your physician has recommended you make the following change in your medication:  Increase Furosemide 40 mg a day and Klor-Con to 20 MEQ 3)  Your physician has requested that you have an [place].  For further information please visit [email or web address].  Please follow instruction sheet, as given. 4)  Your physician recommends that you return for lab work the same day as your stress test:   BMP, TSH and CBC  - 786.05  414.01  Prescriptions: FUROSEMIDE 40 MG TABS (FUROSEMIDE) once daily  #30 x 6   Entered by:   Sim Boast, RN   Authorized by:   [name], MD, [facility]   Signed by:   Sim Boast, RN on 01/04/2011   Method used:   Electronically to        [facility] [phone]* (retail)       [address]       [place], [place]  [address]       Ph: [phone] or [phone]       Fax: [phone]   RxID:   [id number] M20 20 MEQ CR-TABS (POTASSIUM CHLORIDE CRYS CR) once daily  #30 x 6   Entered by:   Sim Boast, RN   Authorized by:   [name], MD, [facility]   Signed by:   Sim Boast, RN on 01/04/2011   Method used:   Electronically to        [facility] [phone]* (retail)       [address]       [place], [place]  [address]       Ph: [phone] or [phone]       Fax: [phone]   [id number]  I have reviewed and approved all prescriptions at the time of this visit. [name], MD, [facility]  January 04, 2011 2:15 PM

## 2011-01-18 ENCOUNTER — Other Ambulatory Visit: Payer: Medicare Other

## 2011-01-18 LAB — DIFFERENTIAL
Eosinophils Absolute: 0.1 10*3/uL (ref 0.0–0.7)
Eosinophils Relative: 2 % (ref 0–5)
Lymphocytes Relative: 18 % (ref 12–46)
Lymphs Abs: 1.8 10*3/uL (ref 0.7–4.0)
Monocytes Absolute: 0.9 10*3/uL (ref 0.1–1.0)
Monocytes Relative: 9 % (ref 3–12)

## 2011-01-18 LAB — CBC
HCT: 22.9 % — ABNORMAL LOW (ref 39.0–52.0)
HCT: 23.1 % — ABNORMAL LOW (ref 39.0–52.0)
HCT: 31.8 % — ABNORMAL LOW (ref 39.0–52.0)
Hemoglobin: 11 g/dL — ABNORMAL LOW (ref 13.0–17.0)
Hemoglobin: 11.4 g/dL — ABNORMAL LOW (ref 13.0–17.0)
Hemoglobin: 7 g/dL — ABNORMAL LOW (ref 13.0–17.0)
Hemoglobin: 7.9 g/dL — ABNORMAL LOW (ref 13.0–17.0)
Hemoglobin: 8.1 g/dL — ABNORMAL LOW (ref 13.0–17.0)
MCHC: 33.3 g/dL (ref 30.0–36.0)
MCHC: 34.1 g/dL (ref 30.0–36.0)
MCHC: 34.4 g/dL (ref 30.0–36.0)
MCV: 89.2 fL (ref 78.0–100.0)
MCV: 89.7 fL (ref 78.0–100.0)
MCV: 89.8 fL (ref 78.0–100.0)
Platelets: 241 10*3/uL (ref 150–400)
Platelets: 242 10*3/uL (ref 150–400)
Platelets: 261 10*3/uL (ref 150–400)
RBC: 2.29 MIL/uL — ABNORMAL LOW (ref 4.22–5.81)
RBC: 2.59 MIL/uL — ABNORMAL LOW (ref 4.22–5.81)
RBC: 3.56 MIL/uL — ABNORMAL LOW (ref 4.22–5.81)
RBC: 3.73 MIL/uL — ABNORMAL LOW (ref 4.22–5.81)
RDW: 14.1 % (ref 11.5–15.5)
RDW: 14.8 % (ref 11.5–15.5)
RDW: 14.9 % (ref 11.5–15.5)
RDW: 14.9 % (ref 11.5–15.5)
RDW: 15 % (ref 11.5–15.5)
WBC: 11.7 10*3/uL — ABNORMAL HIGH (ref 4.0–10.5)
WBC: 17.9 10*3/uL — ABNORMAL HIGH (ref 4.0–10.5)

## 2011-01-18 LAB — ANAEROBIC CULTURE: Gram Stain: NONE SEEN

## 2011-01-18 LAB — GLUCOSE, CAPILLARY
Glucose-Capillary: 118 mg/dL — ABNORMAL HIGH (ref 70–99)
Glucose-Capillary: 128 mg/dL — ABNORMAL HIGH (ref 70–99)
Glucose-Capillary: 130 mg/dL — ABNORMAL HIGH (ref 70–99)
Glucose-Capillary: 132 mg/dL — ABNORMAL HIGH (ref 70–99)
Glucose-Capillary: 134 mg/dL — ABNORMAL HIGH (ref 70–99)
Glucose-Capillary: 136 mg/dL — ABNORMAL HIGH (ref 70–99)
Glucose-Capillary: 146 mg/dL — ABNORMAL HIGH (ref 70–99)
Glucose-Capillary: 150 mg/dL — ABNORMAL HIGH (ref 70–99)
Glucose-Capillary: 150 mg/dL — ABNORMAL HIGH (ref 70–99)
Glucose-Capillary: 159 mg/dL — ABNORMAL HIGH (ref 70–99)
Glucose-Capillary: 199 mg/dL — ABNORMAL HIGH (ref 70–99)
Glucose-Capillary: 203 mg/dL — ABNORMAL HIGH (ref 70–99)
Glucose-Capillary: 210 mg/dL — ABNORMAL HIGH (ref 70–99)
Glucose-Capillary: 269 mg/dL — ABNORMAL HIGH (ref 70–99)

## 2011-01-18 LAB — COMPREHENSIVE METABOLIC PANEL
ALT: 16 U/L (ref 0–53)
AST: 14 U/L (ref 0–37)
Albumin: 3.1 g/dL — ABNORMAL LOW (ref 3.5–5.2)
Calcium: 9.4 mg/dL (ref 8.4–10.5)
Creatinine, Ser: 1.09 mg/dL (ref 0.4–1.5)
GFR calc Af Amer: >60 mL/min (ref >60)
Sodium: 136 mEq/L (ref 135–145)
Total Protein: 7.1 g/dL (ref 6.0–8.3)

## 2011-01-18 LAB — CROSSMATCH

## 2011-01-18 LAB — HEMOGLOBIN AND HEMATOCRIT, BLOOD
HCT: 21.8 % — ABNORMAL LOW (ref 39.0–52.0)
Hemoglobin: 7.5 g/dL — ABNORMAL LOW (ref 13.0–17.0)

## 2011-01-18 LAB — CULTURE, ROUTINE-ABSCESS: Gram Stain: NONE SEEN

## 2011-01-19 ENCOUNTER — Other Ambulatory Visit (INDEPENDENT_AMBULATORY_CARE_PROVIDER_SITE_OTHER): Payer: Medicare Other | Admitting: *Deleted

## 2011-01-19 ENCOUNTER — Ambulatory Visit (HOSPITAL_COMMUNITY): Payer: Medicare Other | Attending: Cardiology | Admitting: Radiology

## 2011-01-19 VITALS — Ht 69.0 in | Wt 199.0 lb

## 2011-01-19 DIAGNOSIS — R0989 Other specified symptoms and signs involving the circulatory and respiratory systems: Secondary | ICD-10-CM

## 2011-01-19 DIAGNOSIS — I25810 Atherosclerosis of coronary artery bypass graft(s) without angina pectoris: Secondary | ICD-10-CM

## 2011-01-19 DIAGNOSIS — I2510 Atherosclerotic heart disease of native coronary artery without angina pectoris: Secondary | ICD-10-CM

## 2011-01-19 DIAGNOSIS — R0789 Other chest pain: Secondary | ICD-10-CM

## 2011-01-19 DIAGNOSIS — I252 Old myocardial infarction: Secondary | ICD-10-CM

## 2011-01-19 DIAGNOSIS — R5383 Other fatigue: Secondary | ICD-10-CM

## 2011-01-19 DIAGNOSIS — R5381 Other malaise: Secondary | ICD-10-CM

## 2011-01-19 DIAGNOSIS — R079 Chest pain, unspecified: Secondary | ICD-10-CM

## 2011-01-19 LAB — CBC WITH DIFFERENTIAL/PLATELET
Basophils Relative: 0.5 % (ref 0.0–3.0)
Eosinophils Absolute: 0.1 10*3/uL (ref 0.0–0.7)
Eosinophils Relative: 2.1 % (ref 0.0–5.0)
Hemoglobin: 13.3 g/dL (ref 13.0–17.0)
Lymphocytes Relative: 25.7 % (ref 12.0–46.0)
MCHC: 34.2 g/dL (ref 30.0–36.0)
Monocytes Relative: 5.7 % (ref 3.0–12.0)
Neutrophils Relative %: 66 % (ref 43.0–77.0)
RBC: 4.65 Mil/uL (ref 4.22–5.81)
WBC: 6.8 10*3/uL (ref 4.5–10.5)

## 2011-01-19 MED ORDER — ADENOSINE (DIAGNOSTIC) 3 MG/ML IV SOLN
0.5600 mg/kg | Freq: Once | INTRAVENOUS | Status: AC
  Administered 2011-01-19: 50.7 mg via INTRAVENOUS

## 2011-01-19 MED ORDER — TECHNETIUM TC 99M TETROFOSMIN IV KIT
33.0000 | PACK | Freq: Once | INTRAVENOUS | Status: AC | PRN
  Administered 2011-01-19: 33 via INTRAVENOUS

## 2011-01-19 MED ORDER — TECHNETIUM TC 99M TETROFOSMIN IV KIT
11.0000 | PACK | Freq: Once | INTRAVENOUS | Status: AC | PRN
  Administered 2011-01-19: 11 via INTRAVENOUS

## 2011-01-19 NOTE — Progress Notes (Addendum)
[place] [address] [phone]  Cardiology Nuclear Med Study  [name] is a 77 y.o. male [id number] 12-13-1932   Nuclear Med Background Indication for Stress Test:  Evaluation for Ischemia, Graft Patency, Stent Patency and PTCA Patency History: 2/09 Myocardial Perfusion Study:Small defect antero-septum, (-) ischemia,EF=50%;8/09 CABG x4;10/10 NSTEMI> Cath: patent grafts except SVG>om1 which was first stented in12/09; 3/12 Echo: EF=50-55% Cardiac Risk Factors: Carotid Disease, History of Smoking, Hypertension, LBBB, Lipids and NIDDM  Symptoms:  Chest Pain/ Pressure with Exertion (last date of chest discomfort yesterday), Diaphoresis, Dizziness, DOE, Fatigue, Fatigue with Exertion, Light-Headedness, Nausea, Palpitations, Rapid HR, SOB and Syncope   Nuclear Pre-Procedure Caffeine/Decaff Intake:  None NPO After: 5:00 PM   Lungs:  Clear IV 0.9% NS with Angio Cath:  20g  IV Site: R Wrist  IV Started by:  Irven Baltimore, RN  Chest Size (in):  46 Cup Size: n/a  Height: 5\' 9"  (1.753 m)  Weight:  199 lb (90.266 kg)  BMI:  Body mass index is 29.39 kg/(m^2). Tech Comments:  Held Carvedilol this am    Nuclear Med Study 1 or 2 day study: 1 day  Stress Test Type:  Adenosine  Reading MD: Darlin Coco, MD  Order Authorizing Provider:  Dr. [name], MD  Resting Radionuclide: Technetium 65m Tetrofosmin  Resting Radionuclide Dose: 11 mCi   Stress Radionuclide:  Technetium 22m Tetrofosmin  Stress Radionuclide Dose: 33 mCi           Stress Protocol Rest HR: 53 Stress HR: 59  Rest BP: 119/65 Stress BP: 147/65  Exercise Time (min): n/a METS: n/a   Predicted Max HR: 145 bpm % Max HR: 40.69 bpm Rate Pressure Product: 8673   Dose of Adenosine (mg):  50.7 Dose of Lexiscan: N/A mg  Dose of Atropine (mg): n/a Dose of Dobutamine: n/a mcg/kg/min (at max HR)  Stress Test Technologist: Irven Baltimore, RN  Nuclear  Technologist:  Annye Rusk, CNMT     Rest Procedure:  Myocardial perfusion imaging was performed at rest 45 minutes following the intravenous administration of Technetium 17m Tetrofosmin. Rest ECG: SB, LBBB,PAC  Stress Procedure:  The patient received IV adenosine at 140 mcg/kg/min for 4 minutes.  The EKG was nondiagnostic due to baseline LBBB. There was short episode 2nd AVB-2 with infusion, rare PAC.Technetium 50m Tetrofosmin was injected at the 2 minute mark and quantitative spect images were obtained after a 45 minute delay. Stress [id number]. Short run of Type I Wenckebach second degree AV block during adenosine infusion.  QPS Raw Data Images:  Mild diaphragmatic attenuation.  Normal left ventricular size. Stress Images:  Normal homogeneous uptake in all areas of the myocardium. Rest Images:  Normal homogeneous uptake in all areas of the myocardium. Subtraction (SDS):  No evidence of ischemia. Transient Ischemic Dilatation (Normal <1.22):  1.11 Lung/Heart Ratio (Normal <0.45):  0.27  Quantitative Gated Spect Images QGS EDV:  131 ml QGS ESV:  61 ml QGS cine images:  NL LV Function; NL Wall Motion QGS EF: 54%  Impression Exercise Capacity:  Adenosine study with no exercise. BP Response:  Normal blood pressure response. Clinical Symptoms:  No chest pain. ECG Impression:  Baseline:  LBBB.  EKG uninterpretable due to LBBB at rest and stress. Comparison with Prior Nuclear Study: Since last study of 11/29/07, there is a fixed inferior wall defect most likely diaphragmatic attenuation.  The inferior wall moves well on cine images.  Overall Impression:  Low risk stress nuclear study.    Darlin Coco  This is a low risk nuclear study. He will continue the current medications.  The patient is aware of the results.  (I called him.) [name]

## 2011-01-20 LAB — BASIC METABOLIC PANEL
Calcium: 9.5 mg/dL (ref 8.4–10.5)
Creatinine, Ser: 1.7 mg/dL — ABNORMAL HIGH (ref 0.4–1.5)
GFR: 43.04 mL/min — ABNORMAL LOW (ref >60.00)
Sodium: 141 mEq/L (ref 135–145)

## 2011-01-20 LAB — GLUCOSE, CAPILLARY
Glucose-Capillary: 120 mg/dL — ABNORMAL HIGH (ref 70–99)
Glucose-Capillary: 123 mg/dL — ABNORMAL HIGH (ref 70–99)

## 2011-01-20 NOTE — Progress Notes (Deleted)
COPY  ROUTED TO DR. HOCHREIN

## 2011-01-20 NOTE — Progress Notes (Signed)
ROUTED TO DR.[place].[facility] [place]

## 2011-01-21 LAB — GLUCOSE, CAPILLARY
Glucose-Capillary: 140 mg/dL — ABNORMAL HIGH (ref 70–99)
Glucose-Capillary: 149 mg/dL — ABNORMAL HIGH (ref 70–99)
Glucose-Capillary: 150 mg/dL — ABNORMAL HIGH (ref 70–99)
Glucose-Capillary: 155 mg/dL — ABNORMAL HIGH (ref 70–99)
Glucose-Capillary: 159 mg/dL — ABNORMAL HIGH (ref 70–99)
Glucose-Capillary: 160 mg/dL — ABNORMAL HIGH (ref 70–99)
Glucose-Capillary: 168 mg/dL — ABNORMAL HIGH (ref 70–99)
Glucose-Capillary: 169 mg/dL — ABNORMAL HIGH (ref 70–99)
Glucose-Capillary: 174 mg/dL — ABNORMAL HIGH (ref 70–99)
Glucose-Capillary: 175 mg/dL — ABNORMAL HIGH (ref 70–99)
Glucose-Capillary: 177 mg/dL — ABNORMAL HIGH (ref 70–99)
Glucose-Capillary: 179 mg/dL — ABNORMAL HIGH (ref 70–99)

## 2011-01-21 LAB — HEMOGLOBIN A1C
Hgb A1c MFr Bld: 6.8 % — ABNORMAL HIGH (ref 4.6–6.1)
Mean Plasma Glucose: 148 mg/dL

## 2011-01-21 LAB — CARDIAC PANEL(CRET KIN+CKTOT+MB+TROPI)
CK, MB: 2.4 ng/mL (ref 0.3–4.0)
CK, MB: 5 ng/mL — ABNORMAL HIGH (ref 0.3–4.0)
CK, MB: 5.6 ng/mL — ABNORMAL HIGH (ref 0.3–4.0)
Relative Index: INVALID (ref 0.0–2.5)
Relative Index: INVALID (ref 0.0–2.5)
Total CK: 40 U/L (ref 7–232)
Total CK: 53 U/L (ref 7–232)
Total CK: 55 U/L (ref 7–232)
Troponin I: 0.38 ng/mL — ABNORMAL HIGH (ref 0.00–0.06)

## 2011-01-21 LAB — MRSA PCR SCREENING: MRSA by PCR: NEGATIVE

## 2011-01-21 LAB — COMPREHENSIVE METABOLIC PANEL
ALT: 14 U/L (ref 0–53)
AST: 15 U/L (ref 0–37)
Albumin: 2.7 g/dL — ABNORMAL LOW (ref 3.5–5.2)
Albumin: 3.4 g/dL — ABNORMAL LOW (ref 3.5–5.2)
Alkaline Phosphatase: 76 U/L (ref 39–117)
BUN: 15 mg/dL (ref 6–23)
Calcium: 9 mg/dL (ref 8.4–10.5)
Chloride: 102 mEq/L (ref 96–112)
Creatinine, Ser: 1.17 mg/dL (ref 0.4–1.5)
GFR calc Af Amer: >60 mL/min (ref >60)
Glucose, Bld: 128 mg/dL — ABNORMAL HIGH (ref 70–99)
Potassium: 4.1 mEq/L (ref 3.5–5.1)
Potassium: 4.2 mEq/L (ref 3.5–5.1)
Sodium: 127 mEq/L — ABNORMAL LOW (ref 135–145)
Total Bilirubin: 1.7 mg/dL — ABNORMAL HIGH (ref 0.3–1.2)
Total Protein: 6.3 g/dL (ref 6.0–8.3)
Total Protein: 6.5 g/dL (ref 6.0–8.3)

## 2011-01-21 LAB — PROTIME-INR
INR: 0.96 (ref 0.00–1.49)
INR: 1.03 (ref 0.00–1.49)
Prothrombin Time: 13.4 seconds (ref 11.6–15.2)

## 2011-01-21 LAB — CBC
HCT: 33.2 % — ABNORMAL LOW (ref 39.0–52.0)
HCT: 40.3 % (ref 39.0–52.0)
Hemoglobin: 11.7 g/dL — ABNORMAL LOW (ref 13.0–17.0)
Hemoglobin: 11.8 g/dL — ABNORMAL LOW (ref 13.0–17.0)
Hemoglobin: 12.3 g/dL — ABNORMAL LOW (ref 13.0–17.0)
MCHC: 34.4 g/dL (ref 30.0–36.0)
MCHC: 34.8 g/dL (ref 30.0–36.0)
MCHC: 35 g/dL (ref 30.0–36.0)
MCHC: 35.3 g/dL (ref 30.0–36.0)
MCV: 92.2 fL (ref 78.0–100.0)
MCV: 92.4 fL (ref 78.0–100.0)
MCV: 93 fL (ref 78.0–100.0)
MCV: 93.1 fL (ref 78.0–100.0)
Platelets: 100 10*3/uL — ABNORMAL LOW (ref 150–400)
Platelets: 120 10*3/uL — ABNORMAL LOW (ref 150–400)
Platelets: 130 10*3/uL — ABNORMAL LOW (ref 150–400)
Platelets: 143 10*3/uL — ABNORMAL LOW (ref 150–400)
RBC: 4.01 MIL/uL — ABNORMAL LOW (ref 4.22–5.81)
RBC: 4.28 MIL/uL (ref 4.22–5.81)
RDW: 13.3 % (ref 11.5–15.5)
RDW: 13.7 % (ref 11.5–15.5)
RDW: 13.7 % (ref 11.5–15.5)
RDW: 13.9 % (ref 11.5–15.5)
WBC: 10.5 10*3/uL (ref 4.0–10.5)
WBC: 11.9 10*3/uL — ABNORMAL HIGH (ref 4.0–10.5)

## 2011-01-21 LAB — MAGNESIUM
Magnesium: 1.7 mg/dL (ref 1.5–2.5)
Magnesium: 2.2 mg/dL (ref 1.5–2.5)

## 2011-01-21 LAB — CK TOTAL AND CKMB (NOT AT ARMC)
CK, MB: 3.2 ng/mL (ref 0.3–4.0)
Relative Index: INVALID (ref 0.0–2.5)

## 2011-01-21 LAB — URINALYSIS, DIPSTICK ONLY
Bilirubin Urine: NEGATIVE
Glucose, UA: 250 mg/dL — AB
Ketones, ur: 15 mg/dL — AB
Protein, ur: 100 mg/dL — AB
pH: 5.5 (ref 5.0–8.0)

## 2011-01-21 LAB — GRAM STAIN

## 2011-01-21 LAB — HEPARIN LEVEL (UNFRACTIONATED)
Heparin Unfractionated: 0.36 IU/mL (ref 0.30–0.70)
Heparin Unfractionated: 0.51 IU/mL (ref 0.30–0.70)

## 2011-01-21 LAB — DIFFERENTIAL
Basophils Relative: 0 % (ref 0–1)
Eosinophils Absolute: 0.1 10*3/uL (ref 0.0–0.7)
Lymphs Abs: 1.7 10*3/uL (ref 0.7–4.0)
Neutrophils Relative %: 81 % — ABNORMAL HIGH (ref 43–77)

## 2011-01-21 LAB — BODY FLUID CULTURE

## 2011-01-21 LAB — BASIC METABOLIC PANEL
BUN: 22 mg/dL (ref 6–23)
CO2: 24 mEq/L (ref 19–32)
Chloride: 101 mEq/L (ref 96–112)
GFR calc Af Amer: 58 mL/min — ABNORMAL LOW (ref >60)
GFR calc Af Amer: >60 mL/min (ref >60)
GFR calc non Af Amer: 48 mL/min — ABNORMAL LOW (ref >60)
Potassium: 4 mEq/L (ref 3.5–5.1)
Potassium: 4.2 mEq/L (ref 3.5–5.1)
Sodium: 134 mEq/L — ABNORMAL LOW (ref 135–145)

## 2011-01-21 LAB — PHOSPHORUS
Phosphorus: 1.8 mg/dL — ABNORMAL LOW (ref 2.3–4.6)
Phosphorus: 2.5 mg/dL (ref 2.3–4.6)
Phosphorus: 3.2 mg/dL (ref 2.3–4.6)
Phosphorus: 3.7 mg/dL (ref 2.3–4.6)

## 2011-01-21 LAB — TSH: TSH: 3.793 u[IU]/mL (ref 0.350–4.500)

## 2011-01-21 LAB — ANAEROBIC CULTURE

## 2011-01-21 LAB — LIPID PANEL
Cholesterol: 182 mg/dL (ref 0–200)
LDL Cholesterol: 106 mg/dL — ABNORMAL HIGH (ref 0–99)

## 2011-01-21 LAB — CALCIUM: Calcium: 8.8 mg/dL (ref 8.4–10.5)

## 2011-01-21 LAB — APTT: aPTT: 28 seconds (ref 24–37)

## 2011-01-23 ENCOUNTER — Other Ambulatory Visit: Payer: Self-pay | Admitting: Family Medicine

## 2011-02-01 LAB — BASIC METABOLIC PANEL
Calcium: 8.8 mg/dL (ref 8.4–10.5)
GFR calc non Af Amer: >60 mL/min (ref >60)
Glucose, Bld: 133 mg/dL — ABNORMAL HIGH (ref 70–99)
Sodium: 134 mEq/L — ABNORMAL LOW (ref 135–145)

## 2011-02-01 LAB — GLUCOSE, CAPILLARY

## 2011-02-01 LAB — CBC
Hemoglobin: 12.3 g/dL — ABNORMAL LOW (ref 13.0–17.0)
Platelets: 139 10*3/uL — ABNORMAL LOW (ref 150–400)
RDW: 21 % — ABNORMAL HIGH (ref 11.5–15.5)
WBC: 10.1 10*3/uL (ref 4.0–10.5)

## 2011-02-02 ENCOUNTER — Other Ambulatory Visit: Payer: Self-pay | Admitting: Family Medicine

## 2011-02-03 ENCOUNTER — Other Ambulatory Visit: Payer: Self-pay | Admitting: *Deleted

## 2011-02-03 NOTE — Telephone Encounter (Signed)
No  Longer making Avandamet and needs something to replace and some kind of arthritis. [facility]

## 2011-02-03 NOTE — Telephone Encounter (Signed)
Wants RX for arthritis also.

## 2011-02-03 NOTE — Telephone Encounter (Signed)
Pharmacist recommends Acto Plus Met to replace Avandamet.

## 2011-02-04 MED ORDER — DICLOFENAC SODIUM 75 MG PO TBEC: 75.0000 mg | DELAYED_RELEASE_TABLET | Freq: Two times a day (BID) | ORAL | Status: DC

## 2011-02-04 MED ORDER — PIOGLITAZONE HCL 30 MG PO TABS: 30.0000 mg | ORAL_TABLET | Freq: Every day | ORAL | Status: DC

## 2011-02-04 MED ORDER — METFORMIN HCL 500 MG PO TABS: 500.0000 mg | ORAL_TABLET | Freq: Every day | ORAL | Status: DC

## 2011-02-04 NOTE — Telephone Encounter (Signed)
Will call and change to actosplus met, and arthritis tx

## 2011-02-16 ENCOUNTER — Inpatient Hospital Stay (HOSPITAL_COMMUNITY)
Admission: EM | Admit: 2011-02-16 | Discharge: 2011-02-25 | DRG: 438 | Disposition: A | Discharge status: Home / Self Care | Payer: Medicare Other | Attending: Internal Medicine | Admitting: Internal Medicine

## 2011-02-16 ENCOUNTER — Emergency Department (HOSPITAL_COMMUNITY): Payer: Medicare Other

## 2011-02-16 DIAGNOSIS — E119 Type 2 diabetes mellitus without complications: Secondary | ICD-10-CM | POA: Diagnosis present

## 2011-02-16 DIAGNOSIS — K209 Esophagitis, unspecified without bleeding: Secondary | ICD-10-CM | POA: Diagnosis present

## 2011-02-16 DIAGNOSIS — M1990 Unspecified osteoarthritis, unspecified site: Secondary | ICD-10-CM | POA: Diagnosis present

## 2011-02-16 DIAGNOSIS — I447 Left bundle-branch block, unspecified: Secondary | ICD-10-CM | POA: Diagnosis present

## 2011-02-16 DIAGNOSIS — Z7982 Long term (current) use of aspirin: Secondary | ICD-10-CM

## 2011-02-16 DIAGNOSIS — Z9861 Coronary angioplasty status: Secondary | ICD-10-CM

## 2011-02-16 DIAGNOSIS — I2510 Atherosclerotic heart disease of native coronary artery without angina pectoris: Secondary | ICD-10-CM | POA: Diagnosis present

## 2011-02-16 DIAGNOSIS — I517 Cardiomegaly: Secondary | ICD-10-CM | POA: Diagnosis present

## 2011-02-16 DIAGNOSIS — E876 Hypokalemia: Secondary | ICD-10-CM | POA: Diagnosis not present

## 2011-02-16 DIAGNOSIS — I10 Essential (primary) hypertension: Secondary | ICD-10-CM | POA: Diagnosis present

## 2011-02-16 DIAGNOSIS — K2970 Gastritis, unspecified, without bleeding: Secondary | ICD-10-CM | POA: Diagnosis present

## 2011-02-16 DIAGNOSIS — I080 Rheumatic disorders of both mitral and aortic valves: Secondary | ICD-10-CM | POA: Diagnosis present

## 2011-02-16 DIAGNOSIS — K311 Adult hypertrophic pyloric stenosis: Secondary | ICD-10-CM | POA: Diagnosis present

## 2011-02-16 DIAGNOSIS — E785 Hyperlipidemia, unspecified: Secondary | ICD-10-CM | POA: Diagnosis present

## 2011-02-16 DIAGNOSIS — I509 Heart failure, unspecified: Secondary | ICD-10-CM | POA: Diagnosis present

## 2011-02-16 DIAGNOSIS — I5033 Acute on chronic diastolic (congestive) heart failure: Secondary | ICD-10-CM | POA: Diagnosis present

## 2011-02-16 DIAGNOSIS — I252 Old myocardial infarction: Secondary | ICD-10-CM

## 2011-02-16 DIAGNOSIS — K859 Acute pancreatitis without necrosis or infection, unspecified: Principal | ICD-10-CM | POA: Diagnosis present

## 2011-02-16 DIAGNOSIS — Z951 Presence of aortocoronary bypass graft: Secondary | ICD-10-CM

## 2011-02-16 DIAGNOSIS — R0789 Other chest pain: Secondary | ICD-10-CM | POA: Diagnosis present

## 2011-02-16 DIAGNOSIS — Z8546 Personal history of malignant neoplasm of prostate: Secondary | ICD-10-CM

## 2011-02-16 HISTORY — DX: Heart failure, unspecified: I50.9

## 2011-02-16 HISTORY — DX: Disease of appendix, unspecified: K38.9

## 2011-02-16 HISTORY — DX: Essential (primary) hypertension: I10

## 2011-02-16 HISTORY — DX: Unspecified abdominal hernia without obstruction or gangrene: K46.9

## 2011-02-16 HISTORY — DX: Malignant neoplasm of prostate: C61

## 2011-02-16 LAB — COMPREHENSIVE METABOLIC PANEL
ALT: 16 U/L (ref 0–53)
AST: 12 U/L (ref 0–37)
Albumin: 4.1 g/dL (ref 3.5–5.2)
Alkaline Phosphatase: 97 U/L (ref 39–117)
Potassium: 4 mEq/L (ref 3.5–5.1)
Sodium: 133 mEq/L — ABNORMAL LOW (ref 135–145)
Total Protein: 7.9 g/dL (ref 6.0–8.3)

## 2011-02-16 LAB — DIFFERENTIAL
Basophils Absolute: 0 10*3/uL (ref 0.0–0.1)
Eosinophils Absolute: 0.1 10*3/uL (ref 0.0–0.7)
Eosinophils Relative: 1 % (ref 0–5)
Lymphocytes Relative: 12 % (ref 12–46)
Neutrophils Relative %: 84 % — ABNORMAL HIGH (ref 43–77)

## 2011-02-16 LAB — URINALYSIS, ROUTINE W REFLEX MICROSCOPIC
Bilirubin Urine: NEGATIVE
Glucose, UA: 100 mg/dL — AB
Ketones, ur: NEGATIVE mg/dL
Leukocytes, UA: NEGATIVE
pH: 5 (ref 5.0–8.0)

## 2011-02-16 LAB — CBC
Platelets: 148 10*3/uL — ABNORMAL LOW (ref 150–400)
RDW: 15.7 % — ABNORMAL HIGH (ref 11.5–15.5)
WBC: 13.3 10*3/uL — ABNORMAL HIGH (ref 4.0–10.5)

## 2011-02-16 LAB — URINE MICROSCOPIC-ADD ON

## 2011-02-17 ENCOUNTER — Inpatient Hospital Stay (HOSPITAL_COMMUNITY): Payer: Medicare Other

## 2011-02-17 ENCOUNTER — Encounter (HOSPITAL_COMMUNITY): Payer: Self-pay | Admitting: Radiology

## 2011-02-17 DIAGNOSIS — R932 Abnormal findings on diagnostic imaging of liver and biliary tract: Secondary | ICD-10-CM

## 2011-02-17 DIAGNOSIS — K859 Acute pancreatitis without necrosis or infection, unspecified: Secondary | ICD-10-CM

## 2011-02-17 DIAGNOSIS — I2510 Atherosclerotic heart disease of native coronary artery without angina pectoris: Secondary | ICD-10-CM

## 2011-02-17 LAB — MAGNESIUM: Magnesium: 2 mg/dL (ref 1.5–2.5)

## 2011-02-17 LAB — HEPATIC FUNCTION PANEL
Albumin: 3.9 g/dL (ref 3.5–5.2)
Alkaline Phosphatase: 96 U/L (ref 39–117)
Total Protein: 7.8 g/dL (ref 6.0–8.3)

## 2011-02-17 LAB — BASIC METABOLIC PANEL
CO2: 24 mEq/L (ref 19–32)
Chloride: 101 mEq/L (ref 96–112)
GFR calc Af Amer: >60 mL/min (ref >60)
Potassium: 3.9 mEq/L (ref 3.5–5.1)
Sodium: 135 mEq/L (ref 135–145)

## 2011-02-17 LAB — GLUCOSE, CAPILLARY: Glucose-Capillary: 161 mg/dL — ABNORMAL HIGH (ref 70–99)

## 2011-02-17 LAB — CK TOTAL AND CKMB (NOT AT ARMC)
CK, MB: 2.3 ng/mL (ref 0.3–4.0)
Relative Index: INVALID (ref 0.0–2.5)

## 2011-02-17 LAB — TROPONIN I: Troponin I: <0.3 ng/mL (ref <0.30)

## 2011-02-17 LAB — LIPID PANEL
Total CHOL/HDL Ratio: 4.6 RATIO
VLDL: 22 mg/dL (ref 0–40)

## 2011-02-17 LAB — CBC
HCT: 41.3 % (ref 39.0–52.0)
Hemoglobin: 14.2 g/dL (ref 13.0–17.0)
MCHC: 34.4 g/dL (ref 30.0–36.0)

## 2011-02-17 LAB — CARDIAC PANEL(CRET KIN+CKTOT+MB+TROPI)
CK, MB: 3 ng/mL (ref 0.3–4.0)
Relative Index: INVALID (ref 0.0–2.5)
Total CK: 55 U/L (ref 7–232)

## 2011-02-17 LAB — URINE CULTURE

## 2011-02-17 LAB — LIPASE, BLOOD: Lipase: 451 U/L — ABNORMAL HIGH (ref 11–59)

## 2011-02-17 LAB — HEMOGLOBIN A1C: Hgb A1c MFr Bld: 7.3 % — ABNORMAL HIGH (ref <5.7)

## 2011-02-17 MED ORDER — GADOBENATE DIMEGLUMINE 529 MG/ML IV SOLN
20.0000 mL | Freq: Once | INTRAVENOUS | Status: AC | PRN
  Administered 2011-02-17: 20 mL via INTRAVENOUS

## 2011-02-17 MED ORDER — IOHEXOL 300 MG/ML  SOLN
100.0000 mL | Freq: Once | INTRAMUSCULAR | Status: AC | PRN
  Administered 2011-02-17: 100 mL via INTRAVENOUS

## 2011-02-18 DIAGNOSIS — R932 Abnormal findings on diagnostic imaging of liver and biliary tract: Secondary | ICD-10-CM

## 2011-02-18 DIAGNOSIS — K859 Acute pancreatitis without necrosis or infection, unspecified: Secondary | ICD-10-CM

## 2011-02-18 LAB — GLUCOSE, CAPILLARY
Glucose-Capillary: 209 mg/dL — ABNORMAL HIGH (ref 70–99)
Glucose-Capillary: 169 mg/dL — ABNORMAL HIGH (ref 70–99)
Glucose-Capillary: 164 mg/dL — ABNORMAL HIGH (ref 70–99)

## 2011-02-18 LAB — COMPREHENSIVE METABOLIC PANEL
ALT: 12 U/L (ref 0–53)
Albumin: 3.5 g/dL (ref 3.5–5.2)
Alkaline Phosphatase: 87 U/L (ref 39–117)
BUN: 17 mg/dL (ref 6–23)
Chloride: 103 mEq/L (ref 96–112)
Potassium: 3.5 mEq/L (ref 3.5–5.1)
Sodium: 137 mEq/L (ref 135–145)
Total Bilirubin: 1.5 mg/dL — ABNORMAL HIGH (ref 0.3–1.2)

## 2011-02-18 LAB — CBC
HCT: 39.4 % (ref 39.0–52.0)
MCV: 82.1 fL (ref 78.0–100.0)
Platelets: 144 10*3/uL — ABNORMAL LOW (ref 150–400)
RBC: 4.8 MIL/uL (ref 4.22–5.81)
WBC: 20.8 10*3/uL — ABNORMAL HIGH (ref 4.0–10.5)

## 2011-02-19 ENCOUNTER — Other Ambulatory Visit: Payer: Self-pay | Admitting: Internal Medicine

## 2011-02-19 ENCOUNTER — Inpatient Hospital Stay (HOSPITAL_COMMUNITY): Payer: Medicare Other

## 2011-02-19 DIAGNOSIS — K209 Esophagitis, unspecified: Secondary | ICD-10-CM

## 2011-02-19 DIAGNOSIS — K2900 Acute gastritis without bleeding: Secondary | ICD-10-CM

## 2011-02-19 LAB — CBC
HCT: 39.1 % (ref 39.0–52.0)
Hemoglobin: 13.3 g/dL (ref 13.0–17.0)
MCV: 83.2 fL (ref 78.0–100.0)
RBC: 4.7 MIL/uL (ref 4.22–5.81)
RDW: 16.7 % — ABNORMAL HIGH (ref 11.5–15.5)
WBC: 20.7 10*3/uL — ABNORMAL HIGH (ref 4.0–10.5)

## 2011-02-19 LAB — GLUCOSE, CAPILLARY: Glucose-Capillary: 162 mg/dL — ABNORMAL HIGH (ref 70–99)

## 2011-02-19 LAB — DIFFERENTIAL
Eosinophils Relative: 0 % (ref 0–5)
Lymphocytes Relative: 6 % — ABNORMAL LOW (ref 12–46)
Lymphs Abs: 1.1 10*3/uL (ref 0.7–4.0)
Neutro Abs: 18.1 10*3/uL — ABNORMAL HIGH (ref 1.7–7.7)

## 2011-02-19 LAB — COMPREHENSIVE METABOLIC PANEL
ALT: 10 U/L (ref 0–53)
AST: 10 U/L (ref 0–37)
Albumin: 3.2 g/dL — ABNORMAL LOW (ref 3.5–5.2)
Alkaline Phosphatase: 111 U/L (ref 39–117)
Chloride: 102 mEq/L (ref 96–112)
GFR calc Af Amer: >60 mL/min (ref >60)
Potassium: 3.5 mEq/L (ref 3.5–5.1)
Sodium: 136 mEq/L (ref 135–145)
Total Bilirubin: 1.2 mg/dL (ref 0.3–1.2)
Total Protein: 7.2 g/dL (ref 6.0–8.3)

## 2011-02-20 LAB — GLUCOSE, CAPILLARY
Glucose-Capillary: 154 mg/dL — ABNORMAL HIGH (ref 70–99)
Glucose-Capillary: 170 mg/dL — ABNORMAL HIGH (ref 70–99)
Glucose-Capillary: 170 mg/dL — ABNORMAL HIGH (ref 70–99)

## 2011-02-21 LAB — COMPREHENSIVE METABOLIC PANEL
AST: 10 U/L (ref 0–37)
Albumin: 2.9 g/dL — ABNORMAL LOW (ref 3.5–5.2)
BUN: 30 mg/dL — ABNORMAL HIGH (ref 6–23)
Calcium: 10.3 mg/dL (ref 8.4–10.5)
Chloride: 105 mEq/L (ref 96–112)
Creatinine, Ser: 0.98 mg/dL (ref 0.4–1.5)
GFR calc Af Amer: >60 mL/min (ref >60)
Total Protein: 7.1 g/dL (ref 6.0–8.3)

## 2011-02-21 LAB — GLUCOSE, CAPILLARY
Glucose-Capillary: 149 mg/dL — ABNORMAL HIGH (ref 70–99)
Glucose-Capillary: 145 mg/dL — ABNORMAL HIGH (ref 70–99)
Glucose-Capillary: 153 mg/dL — ABNORMAL HIGH (ref 70–99)
Glucose-Capillary: 154 mg/dL — ABNORMAL HIGH (ref 70–99)

## 2011-02-21 LAB — DIFFERENTIAL
Basophils Absolute: 0 10*3/uL (ref 0.0–0.1)
Basophils Relative: 0 % (ref 0–1)
Eosinophils Absolute: 0.1 10*3/uL (ref 0.0–0.7)
Lymphocytes Relative: 11 % — ABNORMAL LOW (ref 12–46)
Monocytes Relative: 7 % (ref 3–12)
Neutro Abs: 11 10*3/uL — ABNORMAL HIGH (ref 1.7–7.7)
Neutrophils Relative %: 81 % — ABNORMAL HIGH (ref 43–77)

## 2011-02-21 LAB — CBC
HCT: 41 % (ref 39.0–52.0)
MCV: 83.7 fL (ref 78.0–100.0)
Platelets: 188 10*3/uL (ref 150–400)
RBC: 4.9 MIL/uL (ref 4.22–5.81)
RDW: 16.6 % — ABNORMAL HIGH (ref 11.5–15.5)
WBC: 13.5 10*3/uL — ABNORMAL HIGH (ref 4.0–10.5)

## 2011-02-21 LAB — PROTIME-INR: INR: 1.12 (ref 0.00–1.49)

## 2011-02-21 LAB — APTT: aPTT: 34 seconds (ref 24–37)

## 2011-02-22 DIAGNOSIS — K859 Acute pancreatitis without necrosis or infection, unspecified: Secondary | ICD-10-CM

## 2011-02-22 LAB — GLUCOSE, CAPILLARY
Glucose-Capillary: 173 mg/dL — ABNORMAL HIGH (ref 70–99)
Glucose-Capillary: 178 mg/dL — ABNORMAL HIGH (ref 70–99)

## 2011-02-22 LAB — MAGNESIUM: Magnesium: 2.2 mg/dL (ref 1.5–2.5)

## 2011-02-22 LAB — BASIC METABOLIC PANEL
BUN: 22 mg/dL (ref 6–23)
GFR calc non Af Amer: >60 mL/min (ref >60)
Potassium: 3.1 mEq/L — ABNORMAL LOW (ref 3.5–5.1)
Sodium: 136 mEq/L (ref 135–145)

## 2011-02-23 DIAGNOSIS — K859 Acute pancreatitis without necrosis or infection, unspecified: Secondary | ICD-10-CM

## 2011-02-23 LAB — GLUCOSE, CAPILLARY
Glucose-Capillary: 150 mg/dL — ABNORMAL HIGH (ref 70–99)
Glucose-Capillary: 218 mg/dL — ABNORMAL HIGH (ref 70–99)

## 2011-02-23 LAB — BASIC METABOLIC PANEL
CO2: 27 mEq/L (ref 19–32)
Calcium: 9.2 mg/dL (ref 8.4–10.5)
Glucose, Bld: 163 mg/dL — ABNORMAL HIGH (ref 70–99)
Potassium: 3.2 mEq/L — ABNORMAL LOW (ref 3.5–5.1)
Sodium: 136 mEq/L (ref 135–145)

## 2011-02-23 LAB — CULTURE, BLOOD (ROUTINE X 2)
Culture  Setup Time: 201205020829
Culture: NO GROWTH

## 2011-02-24 LAB — CBC
MCH: 27.4 pg (ref 26.0–34.0)
MCHC: 33.8 g/dL (ref 30.0–36.0)
MCV: 81.1 fL (ref 78.0–100.0)
Platelets: 165 10*3/uL (ref 150–400)
RDW: 15.9 % — ABNORMAL HIGH (ref 11.5–15.5)

## 2011-02-24 LAB — BASIC METABOLIC PANEL
BUN: 16 mg/dL (ref 6–23)
Creatinine, Ser: 1.05 mg/dL (ref 0.4–1.5)
GFR calc non Af Amer: >60 mL/min (ref >60)

## 2011-02-24 LAB — GLUCOSE, CAPILLARY: Glucose-Capillary: 172 mg/dL — ABNORMAL HIGH (ref 70–99)

## 2011-02-25 LAB — BASIC METABOLIC PANEL
CO2: 28 mEq/L (ref 19–32)
GFR calc Af Amer: >60 mL/min (ref >60)
GFR calc non Af Amer: >60 mL/min (ref >60)
Glucose, Bld: 166 mg/dL — ABNORMAL HIGH (ref 70–99)
Potassium: 3.2 mEq/L — ABNORMAL LOW (ref 3.5–5.1)
Sodium: 137 mEq/L (ref 135–145)

## 2011-02-26 NOTE — Discharge Summary (Signed)
NAME:  [name], [name]                  ACCOUNT NO.:  [id number]  MEDICAL RECORD NO.:  [id number]           PATIENT TYPE:  I  LOCATION:  5501                         FACILITY:  [facility]  PHYSICIAN:  Sherryl Manges, M.D.  DATE OF BIRTH:  Nov 01, 1932  DATE OF ADMISSION:  02/16/2011 DATE OF DISCHARGE:  02/25/2011                              DISCHARGE SUMMARY   PRIMARY MD:  Frankey Shown, MD  PRIMARY CARDIOLOGIST:  [name], MD, [facility], [place].  DISCHARGE DIAGNOSES: 1. Idiopathic acute pancreatitis. 2. Functional gastric outlet obstruction, secondary to adjacent     duodenal edema, secondary to #1. 3. Uncontrolled hypertension. 4. Grade 2 diastolic dysfunction. 5. Type 2 diabetes mellitus. 6. Dyslipidemia. 7. History of coronary artery disease, status post coronary artery     bypass graft/drug-eluting stent in 2009. 8. History of non-ST elevation myocardial infarction in 2010. 9. Chronic left bundle-branch block pattern. 10.Degenerative joint disease. 11.History of prostate cancer status post radical prostatectomy.  DISCHARGE MEDICATIONS: 1. Clonidine 0.3 mg p.o. t.i.d. 2. Ultram 50 mg p.o. p.r.n. q.8 hours for pain, a total of 21 pills     have been dispensed. 3. Alprazolam 1 mg p.o. p.r.n. t.i.d. for anxiety. 4. Aspirin enteric coated OTC 81 mg p.o. daily. 5. Avandamet (4/500) 1 p.o. b.i.d. 6. Benazepril 20 mg p.o. b.i.d. 7. Coreg 6.25 mg p.o. b.i.d. 8. Frusemide 40 mg p.o. daily. 9. Imdur 60 mg p.o. daily. 10.Klor-Con 20 mEq p.o. daily. 11.Lipitor 40 mg p.o. daily. 12.Metronidazole topical [id number] lotion 1 application applied daily to     affected areas of skin. 13.Nitroglycerin 0.4 mg 1 SL p.r.n. q.5 minutes x3 doses for chest     pain. 14.Protonix 40 mg p.o. daily. 15.Plavix 75 mg p.o. daily. 16.Spironolactone 25 mg p.o. daily.  Note:  Advil has been discontinued, until reevaluated by primary MD.  CONSULTATIONS: 1. Dr Delfin Edis, Gastroenterologist. 2. Dr  Romeo Apple, Cardiologist.  PROCEDURES: 1. Abdominal/chest x-ray, Feb 16, 2011.  This showed cardiomegaly and     pulmonary vascular congestion, atherosclerosis, nonobstructive     bowel gas pattern, cholecystectomy, 12-mm calcification of the left     kidney, high-density material in the upper abdomen, likely     representing ingested contents or just Pepto-Bismol. 2. Abdominal/pelvic CT scan, Feb 17, 2011.  This showed acute     pancreatitis with inflammatory changes surrounding the head of the     pancreas and abutting the duodenum.  There was reactive     inflammation and thickening of the duodenum without perforation.     There may be relative outlet obstruction from duodenal inflammation     due to very distended stomach and proximal duodenum containing     fluid. 3. Abdominal x-ray, Feb 17, 2011.  This showed nasogastric tube in the     stomach. 4. MRI of the abdomen, Feb 18, 2011.  This showed acute pancreatitis     involving the pancreatic head and uncinate process.  No evidence of     pancreatic necrosis, mass, or peripancreatic fluid collections.  No     evidence of choledocholithiasis or biliary  dilatation.  Normal     pancreatic duct anatomy.  No evidence of pancreatic divisum.  There     was minimal perihepatic fluid, bibasilar atelectasis. 5. Abdominal x-ray, Feb 19, 2011.  This showed no evidence of bowel     obstruction.  ADMISSION HISTORY:  As in H and P notes of Feb 16, 2011, dictated by Dr. Gean Birchwood.  However, in brief, this is a 77 year old male, with known history of coronary artery disease status post NSTEMI in 2010, status post CABG/drug-eluting stent in 2009.  History of mild LV dysfunction, ejection fraction 50% in October 2010, previous history of esophageal ulcer, hypertension, dyslipidemia, mild aortic stenosis, type 2 diabetes mellitus, history of prostate cancer status post radical prostatectomy, chronic left bundle-branch block, history of  esophageal stricture and previous dilatation, DJD, status post cholecystectomy, presenting with severe upper abdominal pain, radiating to the back.  INITIAL LABORATORY FINDINGS:  Showed a WBC of 13.3, BUN 26, creatinine 1.4, lipase 754, alkaline phosphatase 97, AST 12, ALT 16.  The patient was admitted for further evaluation, investigation, and management with presumptive diagnosis of acute pancreatitis.  CLINICAL COURSE: 1. Idiopathic acute pancreatitis.  The patient was managed with bowel     rest, intravenous fluid hydration, proton pump inhibitor,     analgesics.  GI consultation was kindly provided by Dr. Delfin Edis, and the patient was subsequently seen by Dr. Erskine Emery.     The patient responded to above-mentioned management measures, with     normalization of lipase levels, diminution of abdominal pain,     improvement in p.o. intake.  He did initially require NG tube     placement because of gastric distention, however, we were     subsequently able to remove this, without any deleterious     consequences.  Diet was advanced.  By Feb 22, 2011, the patient was     on a full liquid diet.  By Feb 24, 2011, he was on a regular low-fat     diet, which he tolerated without any problems whatsoever.  The     patient's triglyceride levels were within normal limits.  He has no     evidence of choledocholithiasis and is not an alcoholic.  He also     had no anatomical abnormalities of the pancreas.  His pancreatitis     was therefore, deemed idiopathic.  2. Functional gastric outlet obstruction.  Imaging studies     demonstrated dilated stomach, necessitating brief NG tube     decompression.  This was deemed likely secondary to functional     gastric outlet obstruction, due to adjacent duodenal edema related     to the patient's pancreatitis.  Following discontinuation of NG     tube, there were no further problems referable to this.  3. Hypertension.  The patient appears to  have a history of difficult     to control hypertension and is on multiple antihypertensive     medications preadmission.  He did have elevated blood pressures     during the course of this hospitalization but this was managed with     titration of the patient's antihypertensive medications.  Further     titration is likely to be required, following discharge, we shall     defer this to his primary MD.  4. Coronary artery disease.  There are no problems referable to this,     and specifically, the patient had no evidence of acute  coronary     syndrome during his hospitalization.  He does have a known history     of grade 2 diastolic dysfunction.  Cardiology consultation was     kindly provided by Dr. Romeo Apple, specifically to address     questions regarding to antiplatelet medication during this     hospitalization.  He okayed for Plavix and aspirin to be     discontinued.  This was implemented, but reinstated on Feb 23, 2011, following improvement in the patient's clinical status.  5. Dyslipidemia.  The patient continues on preadmission statin     treatment.  Lipid profile was considered reasonable during this     hospitalization.  6. Type 2 diabetes mellitus.  The patient, during the acute phase of     his illness, was managed with sliding scale insulin coverage.     However, following resolution of acute problems, was reinstated on     preadmission oral hypoglycemics.  CBG as of Feb 25, 2011 was fairly     reasonable at 173, further improvement is anticipated.  DISPOSITION:  The patient, as of Feb 25, 2011, was asymptomatic, ambulating, tolerating a regular diet, very keen to be discharged. There were no new issues, and he was therefore discharged accordingly.  ACTIVITY:  As tolerated.  Recommended to increase activity slowly.  FOLLOWUP INSTRUCTIONS:  The patient is to follow up with his primary MD, Dr. Tempie Hoist, next week.  In addition, he is to follow up  with his primary cardiologist, Dr. [name], routinely per prior scheduled appointment.  All this has been communicated to the patient and spouse.  They verbalized understanding.     Sherryl Manges, M.D.     CO/MEDQ  D:  02/25/2011  T:  02/26/2011  Job:  [id number]  cc:   Shanna Cisco., MD [name], MD, [facility]  Electronically Signed by Sherryl Manges M.D. on 02/26/2011 06:51:38 PM

## 2011-02-26 NOTE — Group Therapy Note (Signed)
NAME:  [name], [name]                  ACCOUNT NO.:  [id number]  MEDICAL RECORD NO.:  [id number]           PATIENT TYPE:  I  LOCATION:  2609                         FACILITY:  [facility]  PHYSICIAN:  Annita Brod, M.D.DATE OF BIRTH:  [birth year]                                PROGRESS NOTE   PRIMARY CARE PHYSICIAN: Frankey Shown, MD  CONSULTANTS THIS ADMISSION: 1. Ludwig Lean. Doreatha Lew, MD, with Cardiology.  Please note, primary     cardiologist is Dr. [name]. 2. Lowella Bandy. Olevia Perches, MD, with Gastroenterology.  CHIEF COMPLAINT/REASON FOR ADMISSION: Mr. Rampey is a 77 year old male patient with known history of CAD, prior drug eluding stent, on aspirin and Plavix.  He has had prior coronary artery bypass grafting as well.  He was admitted on Feb 17, 2011, with sudden onset of abdominal pain after eating breakfast.  The pain was very severe, stabbing in nature and radiated to the back.  It was also associated with chest pressure.  It was not associated with nausea, vomiting, or diarrhea.  He presented to the ER where an acute abdominal series was unremarkable.  His lipase was elevated.  CT of the chest was pending at the time of admission.  Upon evaluation by the admitting physician, the patient was hypertensive with a BP of 200/70, pulse 50, respirations 18, temperature of 98.2, and O2 saturations 96%.  Abdominal exam revealed a soft, mildly tender abdomen with diffuse tenderness, hypoactive bowel sounds, no guarding or rigidity.  White cell count was 13,300, hemoglobin 13.9, hematocrit 40, platelets 148,000, neutrophils 84%.  Sodium 133, potassium 4.0, chloride 99, CO2 of 22, glucose 226, BUN 26, creatinine 1.4, total bilirubin 1, alkaline phosphatase 97.  AST 12, ALT 16, lipase was somewhat elevated at 754.  UA was negative.  CT of the abdomen and pelvis was pending at the time of admission.  PAST MEDICAL HISTORY: 1. Known CAD, prior CABG, drug eluding stent in 2009. 2.  Non-ST-segment elevation MI in 2010. 3. Mild LV dysfunction with EF of 50% per echocardiogram in October     2010. 4. Prior esophageal ulcer. 5. Hypertension. 6. Dyslipidemia. 7. Mild aortic stenosis. 8. History of pleural effusion. 9. Diabetes type 2, on Avandamet prior to admission. 10.Prostate cancer with prior radical prostatectomy. 11.Chronic left bundle-branch block. 12.Known esophageal stricture and prior dilatation procedures. 13.Degenerative joint disease.  ADMITTING DIAGNOSES: 1. Acute pancreatitis with associated abdominal pain, etiology     unclear. 2. Atypical chest pain in a patient with known coronary artery     disease.  Differential includes cardiac ischemic chest pain, but     most likely is referred pain due to acute pancreatitis. 3. Known coronary artery disease with prior drug eluding stent, on     aspirin and Plavix. 4. Accelerated hypertension with systolic blood pressure greater than     200. 5. Diabetes mellitus type 2 with presenting hyperglycemia. 6. Dyslipidemia. 7. Left ventricular dysfunction, mild with ejection fraction 50%, no     evidence of acute heart failure decompensation at the present time. 8. History of prior complicated cholecystitis with empyema, chest wall  abscess in December 2010. 9. History of prostate cancer, prior prostatectomy procedure.  DIAGNOSTICS: 1. Acute abdominal series on Feb 16, 2011, demonstrates cardiomegaly     and pulmonary vascular congestion.  Atherosclerosis.     Nonobstructive bowel gas pattern.  Prior cholecystectomy.  A 12-mm     calcification of left kidney. 2. CT of abdomen and pelvis with contrast on Feb 17, 2011, shows acute     pancreatitis with inflammatory changes surrounding the head of the     pancreas and abutting the duodenum.  There is reactive inflammation     and thickening of the duodenum without perforation.  There may be     relative gastric outlet obstruction from the duodenal  inflammation     due to a very distended stomach being seen in the proximal duodenum     containing fluid.  Recommendations from the radiologist are that     the patient may benefit from a nasogastric decompression of the     stomach. 3. Portable abdominal x-ray on Feb 17, 2011, shows NG tube in tip of     the stomach with recommendations to advance it to 5-8 cm per better     positioning within the fundus. 4. MRI of the abdomen with contrast on Feb 18, 2011, shows acute     pancreatitis involving the pancreatic head and uncinate process.     No evidence of pancreatic necrosis, mass, or parapancreatic fluid     collections.  No evidence of choledocholithiasis or biliary     dilatation.  Normal pancreatic ductal anatomy.  No evidence of     pancreatic divisum.  Minimal perihepatic fluid and bibasilar     atelectasis. 5. Portable abdominal x-ray on Feb 19, 2011, shows no evidence of bowel     obstruction. 6. Two-D echocardiogram on January 04, 2011.  This is being noted as a     reference point.  This demonstrates moderate LVH with normal     systolic function, EF 99991111.  There was hypokinesis of the     inferior myocardium.  Findings were also consistent with grade 2     diastolic dysfunction.  There was mild aortic stenosis.  There was     mild mitral regurgitation and there was bilateral atrial dilatation     which was mild.  Mild systolic hypertension reading 33 mmHg.  LABORATORY DATA: Lipase at presentation was 754 and slowly trended downward with last lipase evaluated on Feb 19, 2011, which was 64 with an amylase of 49. Hemoglobin A1c was 7.3.  MRSA/PCR screening was negative.  A lipid profile was obtained that showed triglycerides 109, cholesterol 155, HDL 34, LDL 99.  C-reactive protein was 1.6.  Cardiac panels were cycled x3 and showed no evidence of acute ischemic changes.  Urine culture was obtained on Feb 16, 2011, that shows no growth.  Blood cultures were obtained on Feb 17, 2911, x2 collections, no growth.  Last CBC was on Feb 21, 2011, white count 13,500, hemoglobin 13.8, hematocrit 41, and platelets 188,000.  LDH was 214.  PTT 34, PT 14.6 with an INR of 1.12. On Feb 22, 2011, magnesium was 2.2 with a phosphorus of 2.4 and most recent BMET was today, Feb 23, 2011, with a sodium of 136, potassium 3.2, chloride 101, CO2 of 27, glucose 163, BUN 16, and creatinine 0.97.  HOSPITAL COURSE: 1. Idiopathic acute pancreatitis.  The patient was admitted, placed on     bowel rest.  Lipase  has trended downward with bowel rest.  GI was     consulted.  MRCP was obtained and demonstrated no evidence of ductal     stone or biliary obstruction.  He was empirically treated with     Cipro which has been completed.  White count is trending     downward.  No clear cut etiology for pancreatitis, triglycerides     are normal, the patient does not drink alcoholic beverages.  He was     on ACE inhibitor prior to admission, so should consider this as     etiology for pancreatitis. 2. Functional gastric outlet obstruction.  This was felt to be solely     due to adjacent duodenal edema related to the pancreatitis.     Clinically, the patient had signs of gastric distention and     epigastric abdominal pain.  This was confirmed on plain x-ray.  The     patient briefly required NG tube decompression, but as the duodenal     edema has resolved he was able to tolerate NG tube removal and now     the diet is being advanced slowly.  He was started on full liquid     diet on Feb 22, 2011.  He is experiencing some minimal postprandial     pain, so recommend advancing diet very slowly.  The patient does     not tolerate PCA narcotics or other narcotics, so we are beginning Tylenol     for pain management and Ultram as well. 3. Accelerated hypertension.  The patient presented with uncontrolled     hypertension, most likely due to ongoing abdominal pain at that     time.  During the  hospitalization, the patient's blood pressure     fluctuated between being controlled and uncontrolled.  He actually     was transferred to the ICU for brief use of IV antihypertensive     medications.  He later was transitioned over to IV hydralazine and     Lopressor while the NG tube was in place.  His diet has been     advanced and NG tube has remained out.  Transitioning over to usual     oral medications has begun.  His Coreg was resumed on Feb 22, 2011.     He was already on clonidine p.o. and as of today, Feb 23, 2011, we     are discontinuing the IV hydralazine in favor of his home Imdur.     The benazepril that he was on at home has not been started due to     suspicion it may be the etiology for his pancreatitis. 4. Known grade 2 diastolic dysfunction and preserved LV function.     Today, on exam, the patient has posterior crackles, left greater     than right.  He had not been receiving his usual diuretics of Lasix     or spironolactone.  He is not hypoxic, but suspect he may be     developing mild exacerbation of diastolic heart failure, so have     resumed his usual diuretics and are giving the Lasix IV route today     in order to promote diuresis. 5. Known CAD, prior drug eluding stent.  Cardiology did evaluate the     patient this admission and deemed it was appropriate assuming the     time spent since the last drug eluding stent was placed, to go     ahead and  temporarily remove aspirin and Plavix due to ongoing GI     issues.  Plavix will be resumed today, Feb 23, 2011, as well as     aspirin since he is tolerating diet. 6. Diabetes type 2, on Avandamet prior to admission.  CBGs are well     controlled on sliding scale insulin.  We will resume Avandamet     after discharge and has recovered from acute illness. 7. Dyslipidemia.  Lipids are well controlled.  Triglycerides were only     109, so obviously this is not the cause of his pancreatitis. 8. Hypokalemia.  The  patient's potassium has run low for the past 2     days.  We are beginning oral replete and checking a BMET in the     morning.  May need further supplemental potassium in the setting of     resume of Lasix, although hopefully the spironolactone will help     minimize renal potassium losses.  DISPOSITION: At the present time, this patient is appropriate to transfer to a non- telemetry medical floor for further management of his gastrointestinal issues.  Hopefully, he will be ready for discharge within the next 3-5 days, hopefully sooner.     [place] Lissa Merlin, N.P.   ______________________________ Annita Brod, M.D.    ALE/MEDQ  D:  02/23/2011  T:  02/23/2011  Job:  [id number]  Electronically Signed by Erin Hearing N.P. on 02/23/2011 02:52:26 PM Electronically Signed by Gevena Barre M.D. on 02/26/2011 06:20:07 PM

## 2011-02-28 NOTE — H&P (Signed)
NAME:  [name], [name]                  ACCOUNT NO.:  [id number]  MEDICAL RECORD NO.:  [id number]           PATIENT TYPE:  E  LOCATION:  [facility]                         FACILITY:  [facility]  PHYSICIAN:  Rise Patience, MDDATE OF BIRTH:  08-22-1933  DATE OF ADMISSION:  02/16/2011 DATE OF DISCHARGE:                             HISTORY & PHYSICAL   PRIMARY CARE PHYSICIAN:  Frankey Shown, MD  CHIEF COMPLAINT:  Abdominal pain and chest discomfort.  HISTORY OF PRESENT ILLNESS:  This is a 77 year old male with a history of CAD status post CABG and drug-eluting stent in 2009 with history of non-ST elevation MI in 2010 and a history of severe cholecystitis with empyema of the gallbladder and abscess of the right chest wall in December 2010.  At present, he comes with sudden onset of abdominal pain at around 11 o'clock yesterday.  He did have his breakfast, but after the pain started he has not eaten well.  The pain is diffuse across the abdomen, stabbing in nature, radiating to back, eventually became diffuse and started having chest pressure also.  The patient did not have any nausea, vomiting, or diarrhea.  In the ER at this time, acute abdominal series did not show anything significant.  His lipase has been elevated at this time.  The patient has been waiting for a CT chest. The patient will be admitted to a medical service.  The patient denies any shortness of breath or any nausea or vomiting. His abdominal pain has improved after he got some pain relief medication.  Denies any diarrhea.  Denies any dizziness, headache, or visual symptoms.  Denies any focal deficit.  His chest pressure at this time has improved and has resolved.  He is not having any chest pain at this time.  The patient will be admitted for further workup for his abdominal pain, which most likely is from acute pancreatitis.  PAST MEDICAL HISTORY: 1. CAD status post CABG and drug-eluting stent in 2009. 2. History  of non-ST-elevation MI in 2010. 3. History of mild left ventricular dysfunction with an EF of 50% in     October 2010. 4 . History of esophageal ulcer. 1. History of hypertension. 2. History of hyperlipidemia. 3. History of mild aortic stenosis. 4. History of pleural effusion. 5. History of diabetes mellitus, type 2. 6. History of prostate cancer with radical prostatectomy. 7. History of left bundle-branch block. 8. History of esophageal stricture and dilatation. 9. History of degenerative joint disease. 10.History of hyperlipidemia. 11.History of hypertension.  PAST SURGICAL HISTORY: 1. Cholecystectomy. 2. CABG. 3. Drug-eluting stent for his CAD.  MEDICATIONS ON ADMISSION:  At this time only name as we need to verify the dose and confirm it, includes; alprazolam, aspirin, Avandamet, benazepril, Coreg, Catapres, furosemide, isosorbide mononitrate, Lipitor, MetroLotion, nitroglycerin, Protonix, Plavix, potassium chloride, spironolactone, and tramadol.  ALLERGIES: 1. CODEINE. 2. MORPHINE SULFATE.  SOCIAL HISTORY:  The patient does not smoke cigarette, drink alcohol, or use illegal drugs.  Married.  Full code.  FAMILY HISTORY:  Significant for CVA and coronary artery disease.  REVIEW OF SYSTEMS:  As present in  history of present illness, nothing else significant.  PHYSICAL EXAMINATION:  GENERAL:  The patient examined at bedside, not in acute distress. VITAL SIGNS:  Blood pressure is 200/70; pulse is around 50 per minute, sinus; temperature 98.2; respirations 18 per minute; and O2 sat 96%. HEENT:  Anicteric.  No pallor.  No discharge from ears, eyes, nose, or mouth. CHEST:  Bilateral air entry present.  No rhonchi.  No crepitation. HEART:  S1-S2 heard. ABDOMEN:  Soft, mild, diffuse tenderness.  Bowel sounds not appreciated. No guarding or rigidity. CNS:  Alert, awake, and oriented to time, place, and person.  Moves upper and lower extremities 5/5. EXTREMITIES:   Peripheral pulses felt.  No edema.  LABORATORY DATA:  EKG shows sinus rhythm with LBBB, which is chronic. Heart rate is around 47 beats per minute.  Acute abdominal series shows cardiomegaly and pulmonary vascular congestion, atherosclerosis, nonobstructive bowel gas pattern, cholecystectomy, 12-mm calcification of the left kidney, high density material in the upper abdomen likely representing undigested contents such as Pepto-Bismol.  CBC:  WBC is 13.3, hemoglobin is 13.9, hematocrit is 40.1, platelets 148, neutrophils 84%.  Complete metabolic panel:  Sodium Q000111Q, potassium 4, chloride 99, carbon dioxide 22, glucose 226, BUN 26, and creatinine 1.4.  Total bilirubin is 1, alkaline phosphatase 97, AST 12, ALT 16, total protein 7.9, albumin 4.1, calcium 10.3, and lipase 754.  UA is negative for nitrites and leukocytes.  CT abdomen and pelvis is pending at this time.  ASSESSMENT: 1. Abdominal pain, most likely from acute pancreatitis. 2. Chest discomfort.  At this time, I think it may be related to his     pancreatitis, but we got to rule out acute coronary syndrome. 3. History of coronary artery disease status post coronary artery  bypass graft status post stents. 4. Axillary hypertension. 5. History of diabetes mellitus type 2. 6. History of hyperlipidemia. 7. History of left ventricular dysfunction with ejection fraction of     50%. 8. Previous history of complicated cholecystitis with empyema and     chest wall abscess in December 2010. 9. History of carcinoma of prostate.  PLAN: 1. At this time, admit the patient to telemetry. 2. For his possible pancreatitis, at this time the patient will be     kept n.p.o. except medications.  We  will get a CT of abdomen and     pelvis with contrast, aggressively hydrate, and careful not to     overload.  Keep the patient on strict intake and output and daily     weights.  Repeat LFTs and lipase in a.m.  We will also check CRP. 3. Chest  discomfort.  At this time, the patient is chest pain-free.  I     think his chest pain at this time may be related to his     pancreatitis, but given his cardiac history we are going to cycle     cardiac markers.  We will continue his aspirin and Plavix for now     and nitroglycerin p.r.n. 4. For accelerated hypertension, we will keep the patient on p.r.n. IV     hydralazine.  At this time, the patient is mildly bradycardic also.     His blood pressure may be increased because of pain, which may     improve with pain-relief medication.  If his blood pressure tends     to be very high, we may have to start IV antihypertensives as an     infusion. 5. Further recommendation  will be based on tests ordered and clinical     course.  At this time, the patient does have mild leukocytosis,     but he is afebrile.  We will follow the CT abdomen and pelvis and     see if there are any different indications to start any     antibiotics.  At this time, I will hold off until we get a CAT scan     done.     Rise Patience, MD     ANK/MEDQ  D:  02/17/2011  T:  02/17/2011  Job:  [id number]  cc:   Shanna Cisco., MD  Electronically Signed by Gean Birchwood MD on 02/28/2011 08:40:04 AM

## 2011-03-02 NOTE — Assessment & Plan Note (Signed)
[facility] OFFICE NOTE   NAME:[name], [name]                         MRN:          [id number]  DATE:10/14/2008                            DOB:          26-Sep-1933    PRIMARY CARE PHYSICIAN:  Frankey Shown, MD   REASON FOR PRESENTATION:  Evaluate the patient with coronary disease and  recurrent chest pain.   HISTORY OF PRESENT ILLNESS:  The patient is a pleasant 77 year old  gentleman who has coronary disease status post recent CABG.  He had been  doing relatively well until about 6 weeks ago when he started having  recurrent chest discomfort.  This happens with any activity such as  walking a short distance on level ground.  He describes as severe  discomfort going from his chest and up to his jaw.  He stops what he is  doing and it goes away in about 5 minutes.  It is the same as the pain  he had prior to his bypass.  He has some incisional sternal discomfort,  but this is not the discomfort that he is describing.  He will get  slightly diaphoretic with this.  He will get dizzy.  He has been a  little nauseated.  He has not had any symptoms at rest.  He does not  have nitroglycerin to take.  He has had no resting shortness of breath.  Denies any PND or orthopnea.  He has had no palpitations or syncope.  He  also has noted his blood pressure to be elevated.  He actually stopped  rehab because his blood pressure was elevated a lot of the time.   PAST MEDICAL HISTORY:  Coronary artery disease (Catheterization on  May 23, 2008, demonstrated the LAD at 70% stenosis followed by 80%  stenosis, circumflex had 40-50% stenosis in the AV groove, first  marginal had 50-70% stenosis, second marginal had 60-70% stenosis, there  was a large posterolateral which had 70-80% stenosis.  The right  coronary artery was occluded with left-to-right collaterals.  The EF was  40-45% with severe hypokinesis), recurrent pleural  effusion status post  CABG, mild aortic stenosis, hypertension, dyslipidemia, diabetes  mellitus, degenerative joint disease, prostate cancer, esophageal  stricture status post dilatation, chronic left bundle branch block.   PAST SURGICAL HISTORY:  CABG (August 2009 by Dr. Rollene Fare, LIMA to the  LAD, SVG to PDA, SVG to first obtuse marginal, and SVG to  posterolateral), radical prostatectomy.   ALLERGIES AND INTOLERANCES:  None.   MEDICATIONS:  1. Benazepril 20 mg daily.  2. Potassium 40 mEq daily.  3. Furosemide 40 mg daily.  4. Aspirin 81 mg daily.  5. Clonidine 0.2 mg b.i.d.  6. Lipitor 40 mg daily.  7. Avandamet 4/500 b.i.d.  8. Lopressor 50 mg b.i.d.   SOCIAL HISTORY:  The patient is married.  He is retired.  He has  children.  He quit smoking 40 years ago.   FAMILY HISTORY:  Contributory for his father dying of myocardial  infarction at 35, his brother had a CVA at  younger age.   REVIEW OF SYSTEMS:  As stated in the HPI and otherwise negative for all  other systems.   PHYSICAL EXAMINATION:  GENERAL:  The patient is very pleasant and in no  distress.  VITAL SIGNS:  Blood pressure 200/102 (came down to 183/85 after  clonidine), heart rate 68 and regular, weight 203 pounds, body mass  index 30.  HEENT:  Eyes unremarkable.  Pupils equal, round, and reactive to light.  Fundi not visualized.  Oral mucosa unremarkable.  NECK:  No jugular venous distention at 45 degrees.  Carotid upstroke  brisk and symmetrical.  No bruits, no thyromegaly.  LYMPHATICS:  No  cervical, axillary, or inguinal adenopathy.  LUNGS:  Clear to auscultation bilaterally.  BACK:  No costovertebral angle tenderness.  CHEST:  Well-healed sternotomy scar.  HEART:  PMI not displaced or sustained.  S1 and S2 within normal limits.  No S3, no S4.  No clicks, no rubs, no murmurs.  ABDOMEN:  Obese, mildly,  positive bowel sounds normal in frequency and pitch.  No bruits, no  rebound, no guarding.  No midline  pulsatile mass.  No hepatomegaly, no  splenomegaly.  SKIN:  No rashes, no nodules.  EXTREMITIES:  2+ pulses throughout.  No edema, no cyanosis, no clubbing.  NEUROLOGIC:  Oriented to person, place, and time.  Cranial nerves II  through XII grossly intact.  Motor grossly intact.   EKG sinus rhythm, rate 68, left bundle-branch block.   ASSESSMENT AND PLAN:  1. Coronary artery disease.  The patient has recurrence of chest pain      consistent with unstable angina.  At this point, he needs another      cardiac catheterization.  I have given him a prescription for      sublingual nitroglycerin.  He has been taught how to use this.  He      knows to present to the ER if he has any increasing symptoms.  He      is to remain inactive until we do this catheterization.  2. Hypertension.  Blood pressure is elevated and I gave him 0.1 of      clonidine in the office to bring it down.  I am increasing his      clonidine to 0.3 mg b.i.d.  I am increasing his benazepril to 20 mg      b.i.d.  We will assess his blood pressure at the time of his      catheterization as well.  3. Dyslipidemia per Dr. Joni Fears with a goal LDL less than 70 and HDL      greater than 40.  4. Diabetes per Dr. Joni Fears.  5. Left bundle-branch block, this is baseline.  6. Followup.  I will see him at the time of his catheterization in 2      days.     [name], MD, [facility]  Electronically Signed    JH/MedQ  DD: 10/14/2008  DT: 10/15/2008  Job #: [id number]   cc:   Shanna Cisco., MD

## 2011-03-02 NOTE — Assessment & Plan Note (Signed)
OFFICE VISIT   [name], [name]  DOB:  04/05/1933                                        June 21, 2008  CHART #:  [id number]   CURRENT PROBLEMS:  1. Status post coronary artery bypass graft x4 on May 27, 2008, for      class IV unstable angina with 3-vessel disease and reduced left      ventricular function.  2. Postop of left pleural effusion, treated today with a left      thoracentesis, removal of 950 mL.  3. Hypertension.  4. Status post prostatectomy for cancer.   CURRENT MEDICATIONS:  Benazepril 30 mg a day, Lopressor 50 mg b.i.d.,  clonidine 0.1 mg b.i.d., Avandamet 4/500 one b.i.d., Lipitor 40 mg  daily, and aspirin 81 mg daily.   PRESENT ILLNESS:  The patient returns for his first postop visit after  CABG x4 for severe multivessel coronary artery disease on May 27, 2008.  He has done well without recurrent chest pain or symptoms of CHF  and the surgical incisions are healing well.  He does have some  shortness of breath while moving around the home.  He was seen by Dr.  Percival Spanish earlier this week and received a good report.  He is scheduled  to begin his rehab outpatient therapy.   PHYSICAL EXAMINATION:  VITAL SIGNS:  Blood pressure is 150/80, pulse 70  and regular, respirations 18, and saturation on room air 98%.  LUNGS:  Breath sounds are diminished at the left base, otherwise clear.  The sternum is stable and well healed.  HEART:  Rhythm is regular without murmur or rub.  EXTREMITIES:  The leg incisions are well healed without peripheral  edema.   A PA and lateral chest x-ray today shows a moderate left pleural  effusion, otherwise clear.  His discharge x-ray showed no pleural  effusion.   PROCEDURE:  Under local anesthesia and informed consent, a left  thoracentesis was performed in the office removing 950 mL of straw-  colored fluid.  There are no complications and chest x-ray is pending.   PLAN:  The patient will resume  his medications and for some reason, he  was not taking aspirin, but he will start taking enteric-coated 81 mg  aspirin daily.  He will return in 2 weeks with a chest x-ray to make  sure the left pleural effusion has resolved.  He can resume driving and  light activities, but not to lift more than 20 pounds until November.   [name], M.D.  Electronically Signed   PV/MEDQ  D:  06/21/2008  T:  06/21/2008  Job:  [id number]   cc:   [name], MD, [facility]  Shanna Cisco., MD

## 2011-03-02 NOTE — Assessment & Plan Note (Signed)
OFFICE VISIT   [name], [name]  DOB:  [birth year]                                        August 02, 2008  CHART #:  [id number]   CURRENT PROBLEMS:  1. Recurrent left pleural effusion status post CABG x4 August 2009,      for severe multivessel coronary artery disease with reduced LV      function.  2. Hypertension.   HISTORY OF PRESENT ILLNESS:  The patient returns for a followup exam and  chest x-ray, 1 month after undergoing a thoracentesis for 1.5 liters of  an inflammatory transudate in the left pleural space after CABG.  He was  given a course of oral prednisone following the last thoracentesis.  So  far, he has had no significant recurrence of his effusion.  At this  point, he has only baseline mild dyspnea on exertion.  He denies chronic  cough.   PHYSICAL EXAMINATION:  VITAL SIGNS:  Blood pressure 180/90, pulse 90,  respirations 18, and saturation 94% on room air.  GENERAL:  He is alert and pleasant.  LUNGS:  Breath sounds are equal bilaterally.  The sternum is stable and  well healed.  CARDIAC:  Rhythm is regular.   LABORATORY DATA:  PA and lateral chest x-ray shows overall improvement  in lung aeration.  There is only a very insignificant left pleural  effusion remaining.  The cardiac silhouette is stable.   IMPRESSION AND PLAN:  The patient now appears to be doing well after no  evidence of significant recurrence of pleural effusion 1 month after the  thoracentesis.  He will check with his  primary care physician about better blood pressure control and continue  his outpatient cardiac rehab.  I will plan on seeing him back in 6 weeks  with a final chest x-ray.   Ivin Poot, M.D.  Electronically Signed   PV/MEDQ  D:  08/02/2008  T:  08/03/2008  Job:  [id number]

## 2011-03-02 NOTE — Discharge Summary (Signed)
NAME:  [name], [name]                  ACCOUNT NO.:  [id number]   MEDICAL RECORD NO.:  [id number]          PATIENT TYPE:  INP   LOCATION:  2019                         FACILITY:  [facility]   PHYSICIAN:  Ivin Poot, M.D.  DATE OF BIRTH:  January 28, 1933   DATE OF ADMISSION:  05/22/2008  DATE OF DISCHARGE:  05/31/2008                               DISCHARGE SUMMARY   ADDENDUM   This is an addendum to previously dictated discharge summary.  [name]  has been evaluated on morning rounds on May 31, 2008 and is scheduled  to be discharged home today.  His blood pressures have continued to  trend upward and we will increase his benazepril to 30 mg daily at the  recommendation of the cardiologist.  He will continue his Lopressor at  50 mg b.i.d. and his other medications are unchanged from the previously  dictated discharge summary.  His labs on the morning of discharge are  stable.  His white blood cell count has trended downward at 12,000 and  he has been afebrile.  He is diuresing, although he still approximately  7 kg above his preoperative weight.  He is ready for discharge home at  this time.  Discharge instructions are unchanged from the previously  dictated discharge summary.      Suzzanne Cloud, P.A.      Ivin Poot, M.D.  Electronically Signed    GC/MEDQ  D:  05/31/2008  T:  05/31/2008  Job:  [id number]   cc:   [facility]

## 2011-03-02 NOTE — Assessment & Plan Note (Signed)
[facility] OFFICE NOTE   NAME:[name], [name]                         MRN:          [id number]  DATE:10/14/2008                            DOB:          11/03/1932    PRIMARY CARE PHYSICIAN:  Frankey Shown, MD   REASON FOR PRESENTATION:  Evaluate the patient with coronary disease and  recurrent chest pain.   HISTORY OF PRESENT ILLNESS:  The patient is a very pleasant 77 year old  gentleman with coronary disease status post bypass surgery as described.  Unfortunately, he says he has had recurrent chest discomfort over the  last 5 weeks or so.  He describes a severe discomfort that happens with  any activity such as walking a short distance on level ground.  He has  not had any at rest.  He stops what he is doing and it always goes away  after 5 minutes.  It does radiate to his job.  He may get some  sweatiness and dizziness.  He may get some mild nausea.  He has not had  any sublingual nitroglycerin to take.  This discomfort has been exactly  the same as he had prior to his surgery.  He is not having any new  shortness of breath.  He is not describing any PND or orthopnea.  He has  had no palpitations and has had no syncopal episodes.   PAST MEDICAL HISTORY:  1. Coronary artery disease (catheterization on May 23, 2008,      demonstrated LAD 70% followed by 80% stenosis, circumflex had 40-      50% stenosis in the AV groove, first marginal had 50-70% stenosis,      second marginal had 60-70% stenosis.  There was a large      posterolateral which had 70-80% stenosis.  The right coronary      artery was occluded to left-to-right collaterals).  2. Mildly reduced ejection fraction (40-45% with inferior      hypokinesis).  3. Recurrent pleural effusion status post CABG.  4. Mild aortic stenosis.  5. Hypertension.  6. Dyslipidemia.  7. Diabetes mellitus.  8. Degenerative joint disease.  9. Chronic low  bundle-branch block,  10.Esophageal stricture status post dilatation.   PAST SURGICAL HISTORY:  CABG in August 2009 by Dr. Prescott Gum (with a  LIMA to the LAD, SVG to PDA, SVG to first obtuse marginal, and SVG to  posterolateral), radical prostatectomy for prostate cancer.   ALLERGIES/INTOLERANCES:  None.   MEDICATIONS:  1. Benazepril 20 mg daily.  2. Potassium 40 mEq daily.  3. Furosemide 40 mg daily.  4. Aspirin 81 mg daily.  5. Clonidine 0.2 mg b.i.d.  6. Lipitor 40 mg daily.  7. Avandamet 4/500 b.i.d.  8. Lopressor 50 mg b.i.d.   SOCIAL HISTORY:  The patient is married.  Retired.  He quit smoking 40  years ago.   Family history is contributory for his father dying of a myocardial  infarction at 13.  He had a brother with a CVA at a younger age.   REVIEW OF  SYSTEMS:  As stated in the HPI, negative for all other  systems.   PHYSICAL EXAMINATION:  The patient is pleasant and in no distress.  Blood pressure 200/102 (came down to   Dictation ended at this point.     [name], MD, [facility]     JH/MedQ  DD: 10/14/2008  DT: 10/15/2008  Job #: [id number]

## 2011-03-02 NOTE — Assessment & Plan Note (Signed)
OFFICE VISIT   [name]  DOB:  11-23-1932                                        September 27, 2008  CHART #:  [id number]   CURRENT PROBLEMS:  1. Left pleural effusion, status post coronary artery bypass graft x4      in August 2009 for severe multivessel diabetic coronary artery      disease.  2. Reduced left ventricular ejection fraction.  3. Hypertension.   HISTORY OF PRESENT ILLNESS:  The patient is a 77 year old gentleman, who  returns for his final office visit with chest x-ray to assess a  postoperative left pleural effusion, which required thoracentesis and a  course of oral prednisone therapy.  His chest x-ray shows complete  resolution of the pleural effusion as he has been placed on daily Lasix  and potassium by his cardiologist Dr. Percival Spanish.  I am concerned when he  was loading a car earlier this week to make the trip from [facility]  to [place], he had some shortness of breath and some neck tightness.  This resolved with rest.  He remains on his cardiac meds including  aspirin, Lipitor, clonidine, benazepril, Lopressor, Lasix, and potassium  recently added.   PHYSICAL EXAMINATION:  VITAL SIGNS:  Blood pressure 160/80, pulse 70,  respirations 18, and saturation 98% on room air.  GENERAL:  He is alert and pleasant.  LUNGS:  Breath sounds are completely clear now.  CHEST:  His sternal incision is well healed.  He has a regular rhythm  without S3 gallop or murmur.   PLAN:  The patient's chest x-ray shows complete resolution of the  pleural effusion and he is doing well, but I am concern over the episode  of exertional shortness of breath with some neck  tightness and I have contacted Dr. Rosezella [place] office to evaluate him for  possible recurrent angina.   Ivin Poot, M.D.  Electronically Signed   PV/MEDQ  D:  09/27/2008  T:  09/28/2008  Job:  [id number]   cc:   Shanna Cisco., MD  [name], MD, [facility]

## 2011-03-02 NOTE — Assessment & Plan Note (Signed)
[place] OFFICE NOTE   NAME:Holm, JAZE COLANTONIO                         MRN:          [id number]  DATE:06/10/2008                            DOB:          December 13, 1932    PRIMARY CARE PHYSICIAN:  Frankey Shown, MD   REASON FOR PRESENTATION:  Evaluate the patient with recent CABG.   HISTORY OF PRESENT ILLNESS:  The patient presents for followup after his  recent hospitalization.  He was admitted on 05/22/2008 with unstable  angina.  He subsequently was found to have three-vessel coronary artery  disease as described below.  He underwent CABG by Dr. Prescott Gum.  He did  well with this.  He says he is having no further chest pain similar to  his previous complaints.  He is doing a little walking.  He is yet to  start cardiac rehab.  He does have some incisional chest discomfort, but  this is mild.  He has no substernal chest pressure, neck or arm  discomfort.  He does not have any palpitation, presyncope, or syncope.  He has had no shortness of breath.  He denies any cough, fevers, or  chills.   PAST MEDICAL HISTORY:  Coronary artery disease (catheterization on  05/23/2008 demonstrated LAD at 70% followed by 80% stenosis, the  circumflex had 40%-50% AV groove stenosis, first marginal 50%-70%  stenosis, second marginal 60%-70% stenosis, large posterolateral had 70%-  80% stenosis.  The right coronary artery was totally occluded with left-  to-right collaterals.  The EF was 40%-45% with severe inferior  hypokinesis), CABG (Dr. Prescott Gum in August 2009.  He had a LIMA to the  LAD, SVG to PDA, SVG to first obtuse marginal, and SVG to  posterolateral), mild aortic valve stenosis, hypertension, dyslipidemia,  diabetes mellitus, degenerative joint disease, radical prostatectomy for  prostate cancer, and lower esophageal stricture status post dilatation.   ALLERGIES/INTOLERANCE:  None.   MEDICATIONS:  1. Avandamet  4/500 b.i.d.  2. Lopressor 50 mg b.i.d.  3. Benazepril 30 mg daily.  4. Clonidine 1 mg b.i.d.  5. Simvastatin (this was left off his med list at discharge).   REVIEW OF SYSTEMS:  As stated in the HPI and otherwise negative for all  other systems.   PHYSICAL EXAMINATION:  GENERAL:  The patient is in no distress.  VITAL SIGNS:  Blood pressure 150/76, heart rate 60 and regular, weight  207 pounds, and body mass index 30.  HEENT:  Eyelids unremarkable.  Pupils equal, round, and reactive to  light.  Fundi not visualized.  Oral mucosa unremarkable.  NECK:  No jugular venous distention at 45 degrees.  Carotid upstroke  brisk and symmetrical.  No bruits.  No thyromegaly.  LYMPHATICS:  No cervical, axillary, or inguinal adenopathy.  LUNGS:  Clear to auscultation bilaterally with decreased breath sounds  on the left and dullness to percussion at the left base suggesting  effusion.  BACK:  No costovertebral angle mass.  CHEST:  Well-healed sternotomy scar without sternal mobility, drainage,  or erythema.  HEART:  PMI not  displaced or sustained.  S1 and S2 within normal limits.  No S3.  No S4.  No clicks.  No rubs.  No murmurs.  ABDOMEN:  Obese.  Positive bowel sounds, normal in frequency and pitch.  No bruits.  No rebound.  No guarding.  No midline pulsatile mass.  No  hepatomegaly.  No splenomegaly.  SKIN:  No rashes.  No nodules.  EXTREMITIES:  2+ pulses throughout.  No edema, no cyanosis, no clubbing.  NEURO:  Oriented to person, place, and time.  Cranial nerves II through  XII grossly intact.  Motor grossly intact.   EKG sinus rhythm, rate 65.  Axis within normal limits.  Left bundle  branch block.  No change from previous EKGs except the rate is slower.   ASSESSMENT/PLAN:  1. Coronary disease.  The patient is now status post bypass.  He is      doing well recovering from this.  He will continue with secondary      risk reduction.  2. Mildly reduced ejection fraction.  We will  continue him on the      medicines as listed.  Over time, I will probably switch his      clonidine for an ACE inhibitor given the diabetes and slightly      reduced ejection fraction.  I see no contraindication to this.  3. Dyslipidemia.  His lipid profile in February 2009 included an LDL      of 143.  I do not see a more recent one.  I do not think that      simvastatin at 40 mg will help.  I do not like to prescribe this      drug at much higher dose because of increased muscle aches or liver      toxicity.  Therefore, I am going to switch him to Lipitor 40 mg      daily.  He will get a lipid profile and liver enzymes in about 9      weeks when he comes back to see me.  4. Carotid stenosis.  The patient had some mild carotid plaque less      than 60% bilaterally preoperatively.  We will follow this in about      1 year with repeat Dopplers.  5. Diabetes per Dr. Joni Fears.  6. Followup.  I will see him in 9 weeks for fasting liver protime and      routine followup.  I will switch his meds as listed.     [name], MD, [facility]  Electronically Signed    JH/MedQ  DD: 06/10/2008  DT: 06/11/2008  Job #: [id number]   cc:   Shanna Cisco., MD

## 2011-03-02 NOTE — Consult Note (Signed)
NAME:  [name], [name]                  ACCOUNT NO.:  [id number]   MEDICAL RECORD NO.:  [id number]          PATIENT TYPE:  INP   LOCATION:  2023                         FACILITY:  [facility]   PHYSICIAN:  Ivin Poot, M.D.  DATE OF BIRTH:  January 09, 1933   DATE OF CONSULTATION:  05/23/2008  DATE OF DISCHARGE:                                 CONSULTATION   REQUESTING CONSULTATION:  Loretha Brasil. Lia Foyer, MD, [facility]   PRIMARY CARDIOLOGIST:  [name], MD, [facility]   PRIMARY CARE PHYSICIAN:  Frankey Shown, MD   REASON FOR CONSULTATION:  Multivessel coronary artery disease with class  III progressive - unstable angina.   CHIEF COMPLAINT:  Chest pain and shortness of breath with exertion.   HISTORY OF PRESENT ILLNESS:  I was asked to evaluate this 77 year old  male diabetic for potential surgical coronary revascularization for  recently diagnosed severe 3-vessel coronary artery disease.  The patient  has had progressing symptoms of substernal pressing chest pain radiating  to the neck with significant shortness of breath over the past few  weeks.  He is currently unable to do hardly any exertional without these  symptoms.  A stress test performed earlier this year was unremarkable.  He underwent diagnostic left heart cath today by Dr. Lia Foyer, which  demonstrated a 40% calcified left main stenosis, 70-80% stenosis of the  LAD diagonal, 80% stenosis of the OM1 and distal circumflex, and total  occlusion of the codominant right coronary.  His EF was 45% with some  inferior wall hypokinesia and LVEDP was 16 mmHg.  A 2D echo performed in  March of this year showed mild aortic sclerosis with a valve area of 1.8  and a minimal transvalvular gradient.  There is minimal MR and mild LVH.  Due to the patient's coronary anatomy, determine at cath today, and his  symptoms, he was felt to be a candidate for multivessel surgical  revascularization.   PAST MEDICAL HISTORY:  1. Diabetes mellitus.  2.  Hypertension.  3. Hyperlipidemia.  4. Degenerative arthritis.  5. Status post radical prostatectomy for prostate cancer.  6. Lower esophageal stricture, recently successfully dilated due to      acid peptic disease.   HOME MEDICATIONS:  1. Diclofenac 25 mg b.i.d.  2. Amlodipine.  3. Benazepril 10/20 one p.o. daily.  4. Clonidine 1 mg p.o. b.i.d.  5. Avandamet 4 mg p.o. b.i.d.   ALLERGIES:  None.   SOCIAL HISTORY:  The patient is retired and is married.  He quit smoking  40 years ago.  He does not use alcohol.   FAMILY HISTORY:  Positive for MI in his father at age 72.  Mother died  at age 11 of cancer.   REVIEW OF SYSTEMS:  His weight has been stable.  He denies any  constitutional symptoms of fever or night sweats.  ENT review is  negative for change in vision.  He has an upper dental plate and native  mandibular teeth intact.  He states his swallowing difficulty has  improved significantly since his recent esophageal dilatation.  There is  no history of aspiration.  There is no history of thoracic trauma or  abnormal chest x-ray.  His last chest x-ray last year showed no active  disease.  Cardiac review is positive for his severe coronary artery  disease and unstable angina with mild-to-moderate duction LV function,  and a 2D echo is pending tomorrow.  GI review is positive for his lower  esophageal stricture and mild acid peptic disease.  No history of  hepatitis, jaundice, or gallstones.  Endocrine review is positive  diabetes.  Negative thyroid disease.  Vascular review is negative DVT,  claudication, or TIA.  Musculoskeletal review is positive for history of  right ankle fracture with internal fixation at age 22 with mild  bilateral ankle swelling, worse recently.  Neurologic review is negative  stroke, seizure, or TIA.  Hematologic review is negative for bleeding  disorder or blood transfusion.   PHYSICAL EXAMINATION:  The patient is 5 feet 10 inches, weighs 205   pounds, blood pressure is 140/70, and pulse 80, in sinus.  General  appearance is that of pleasant 77 year old male, in no distress.  HEENT  exam is normocephalic.  Dentition good.  Neck is supple without JVD,  mass, or bruit.  Lymphatics reveal no palpable supraclavicular  adenopathy.  Breath sounds are clear and equal.  Cardiac exam was  regular rhythm without S3, gallop, or murmur.  Abdominal exam is obese,  soft and he has a well-healed lower midline incision from the pubis to  the umbilicus from his prostatectomy.  His extremities reveal no  clubbing, cyanosis, or edema.  Peripheral pulses are intact in all  extremities, and there is no rash or skin lesions.  Neurologic exam is  alert and oriented without focal or motor deficit.  He has a compression  dressing in his right groin from his cardiac cath.   LABORATORY DATA:  I reviewed the coronary arteriograms and agree with  Dr. Maren Beach impression of significant multivessel coronary artery  disease with mild-to-moderate reduction LV function.  He would benefit  from surgical revascularization for relief of symptoms and improved  survival.  He received Plavix prior to his cardiac cath today, and we  will plan on scheduling his surgery first prior to next week.      Ivin Poot, M.D.  Electronically Signed     PV/MEDQ  D:  05/23/2008  T:  05/24/2008  Job:  [id number]

## 2011-03-02 NOTE — Assessment & Plan Note (Signed)
OFFICE VISIT   [name], [name]  DOB:  [birth year]                                        July 05, 2008  CHART #:  [id number]   CURRENT PROBLEMS:  1. Recurrent left pleural effusion, status post coronary artery bypass      graft x4 in August 2009 for unstable angina and 3-vessel disease      with reduced left ventricular ejection fraction.  2. Hypertension.   PRESENT ILLNESS:  [name] is a 77 year old gentleman who returns with a  chest x-ray for followup of a postoperative left inflammatory pleural  effusion.  He has had some shortness of breath and cough.  He has had no  angina and otherwise, he is doing well in rehabilitation.  His chest x-  ray taken today shows a recurrent left moderate pleural effusion.   PHYSICAL EXAMINATION:  VITAL SIGNS:  Blood pressure 160/90, pulse 70,  respirations 18, and saturation 98%.  LUNGS:  Breath sounds are diminished on the left.  The sternal incision  is well healed.  CARDIAC:  Rhythm is regular without gallop or rub.  EXTREMITIES:  There is no peripheral edema.   A left thoracentesis was performed, which returned 1600 mL of amber-  colored fluid.  A followup chest x-ray showed a small space at the left  base, but complete resolution of the pleural effusion.   PLAN:  The patient will return for a chest x-ray in 2 weeks.  I gave him  a prescription for prednisone taper from 40 mg down to 0 over 8 days.  I  also gave him a prescription for some Klonopin 0.5 mg nightly for sleep  as he has had severe insomnia.   Ivin Poot, M.D.  Electronically Signed   PV/MEDQ  D:  07/05/2008  T:  07/06/2008  Job:  [id number]

## 2011-03-02 NOTE — Discharge Summary (Signed)
NAME:  [name], [name]                  ACCOUNT NO.:  [id number]   MEDICAL RECORD NO.:  [id number]          PATIENT TYPE:  INP   LOCATION:  6531                         FACILITY:  [facility]   PHYSICIAN:  [name], MD, FACCDATE OF BIRTH:  1933/07/20   DATE OF ADMISSION:  08/07/2008  DATE OF DISCHARGE:  08/09/2008                               DISCHARGE SUMMARY   PROCEDURE:  Ultrasound-guided thoracentesis.   PRIMARY FINAL DISCHARGE DIAGNOSIS:  Acute diastolic congestive heart  failure secondary to post bypass pleural effusion.   SECONDARY DIAGNOSES:  1. Hypertension.  2. Hypokalemia.  3. History of carotid stenosis.  4. History of prostate cancer with radical prostatectomy.  5. Family history of coronary artery disease in his father.  6. Hyperlipidemia.  7. Diabetes.  8. Status post aortocoronary bypass surgery on May 27, 2008 with      left internal mammary artery to left anterior descending, saphenous      vein graft to posterior descending artery, saphenous vein graft to      obtuse marginal 1, saphenous vein graft to posterior lateral.  9. Ischemic cardiomyopathy with an ejection fraction of 30% by      echocardiogram in August 2009.  10.History of esophageal stricture secondary to peptic ulcer disease      with dilatation.   TIME OF DISCHARGE:  39 minutes.   HOSPITAL COURSE:  [name] is a 77 year old male with coronary artery  disease.  He had an inflammatory transudative pleural effusion after his  bypass surgery.  He had thoracentesis in the past removing 1.5 liters of  fluid.  On the day of admission, he had shortness of breath and was a  cardiac rehab where his blood pressure was found to be significantly  elevated.  He was admitted for further evaluation.   Dr. Prescott Gum was consulted and recommended thoracentesis.  Ultrasound-  guided thoracentesis was performed by interventional radiology removing  1.1 liters of fluid.  His BNP improved from 1160 to 993.  He was  not on  a diuretic prior to admission, but Lasix has been started and will be  continued.   His blood pressure was significantly elevated on admission.  His  benazepril was actually decreased from 20 mg 1-1/2 tablet to 20 mg  daily.  The clonidine was increased and the Lasix is new.  The  metoprolol is unchanged.  He will be followed as an outpatient.  By  discharge, his systolic blood pressure was generally in 130s and 140s.   With diuresis, [name] had hypokalemia, but this was supplemented and  the potassium supplementation will continue as an outpatient.  A  hemoglobin A1c was checked and was 6.8.  A lipid profile was performed,  which showed total cholesterol of 101, triglycerides 74, HDL 28, LDL 58.  His Lipitor dose is unchanged.   By August 09, 2008, [name]'s respiratory status has significantly  improved.  He was evaluated by Dr. Percival Spanish and considered stable for  discharge with close outpatient followup.   DISCHARGE INSTRUCTIONS:  1. His activity level is to be increased  gradually.  2. He is to stick to a low-sodium diabetic diet.  3. He is to call for any problems with the thoracentesis site.  4. He is to follow up with Dr. Percival Spanish on August 16, 2008 at 4:30      and we will get a BMET that day.  5. He is to follow up with Dr. Joni Fears and Dr. Prescott Gum as      scheduled.   DISCHARGE MEDICATIONS:  1. Clonidine 0.2 mg b.i.d.  2. Aspirin 81 mg daily.  3. Lotensin 20 mg daily.  4. Avandamet 4/500 b.i.d.  5. Metoprolol 50 mg b.i.d.  6. Lipitor 80 mg 1/2 tablet daily.  7. Diclofenac is on hold.  8. Furosemide 40 mg a day.  9. K-Dur 20 mEq 2 tablets daily.      Rosaria Ferries, PA-C      [name], MD, [facility]  Electronically Signed    RB/MEDQ  D:  08/09/2008  T:  08/09/2008  Job:  [id number]   cc:   Ivin Poot, M.D.  Shanna Cisco., MD

## 2011-03-02 NOTE — Assessment & Plan Note (Signed)
[place] OFFICE NOTE   NAME:[name], [name]                         MRN:          [id number]  DATE:08/16/2008                            DOB:          06-Dec-1932    PRIMARY CARE PHYSICIAN:  Frankey Shown, MD   REASON FOR PRESENTATION:  Evaluate the patient with recent  hospitalization for treatment of pleural effusion.   HISTORY OF PRESENT ILLNESS:  The patient was admitted on August 07, 2008 on cardiac rehab.  He had significant shortness of breath.  He is  also quite hypertensive.  He was found to have recurrent large pleural  effusion.  He had had this after surgery and had to have that drained.  In the hospital, he again had an effusion tapped under ultrasound  guidance.  He has significant improvement in his breathing with this.  He was seen by Dr. Prescott Gum .  He did have his blood pressure meds  adjusted for better blood pressure control.   Since going home, he has done well.  He has not had any of the dyspnea  that he was having.  He has had no PND or orthopnea.  He has had no  fevers, chills, or cough.  He has had no chest discomfort, neck, or arm  discomfort.  He has had no lower extremity swelling.  He has been taking  his blood pressure and I think it has been better controlled at home but  he did not bring the diary or any readings.   PAST MEDICAL HISTORY:  Coronary artery disease (catheterization on  May 23, 2008 demonstrated the LAD had 70% followed by 80% stenosis,  circumflex had 40-50% stenosis in the AV groove, first marginal had 50-  70% stenosis.  Second marginal and 60-70% stenosis, there was a large  posterolateral which had 70-80% stenosis.  The right coronary artery is  occluded with left-to-right collaterals.  The EF was 40-45% with severe  inferior hypokinesis), CABG (Dr. Prescott Gum August 2009.  LIMA to the  LAD, SVG to PDA, SVG to first obtuse marginal, and SVG to  posterolateral), recurrent pleural effusions, mild aortic stenosis,  hypertension, dyslipidemia, diabetes mellitus, degenerative joint  disease, radical prostatectomy for prostate cancer, lower esophageal  stricture, status post dilatation, and chronic left bundle-branch block.  Negative for other systems.   PHYSICAL EXAMINATION:  GENERAL:  The patient is in no distress.  VITAL SIGNS:  Blood pressure 157/76, heart rate 60 and regular, weight  197 pounds, and body mass index 29.  HEENT:  Eyes are unremarkable; pupils equal, equal, round, and reactive  to light; fundi not visualized; oral mucosa unremarkable.  NECK:  No jugular venous distention at 45 degrees; carotid upstroke  brisk and symmetric; no bruits, no thyromegaly.  LYMPHATICS:  No cervical, axillary, or inguinal adenopathy.  LUNGS:  Decreased breath sounds at bilateral bases, left greater than  right; no crackles, no wheezing; there is dullness to percussion at the  left lung base but it is well below the previous thoracentesis mark.  CHEST:  Well-healed sternotomy scar.  HEART:  PMI not displaced or sustained; S1 and S2 within normal limits;  no S3, no S4; no clicks, no rubs, no murmurs.  ABDOMEN:  Mildly obese; positive bowel sounds; normal frequency and  pitch; no bruits, rebound, guarding or midline pulsatile mass; no  thyromegaly, no splenomegaly.  SKIN:  No rashes, no nodules.  EXTREMITIES:  Pulses 2+ throughout; no edema, cyanosis, or clubbing.  NEURO:  Oriented to person, place, and time; cranial nerves II through  XII grossly intact; motor grossly intact.   EKG; sinus bradycardia, left bundle-branch block, no acute ST-T wave  changes.   ASSESSMENT AND PLAN:  1. Coronary artery disease.  The patient is recovering from his bypass      and he setbacks with pleural effusions.  I think he can  go back to      cardiac rehab now.  No further cardiovascular testing is suggested.      We will continue secondary risk  reduction.  2. Dyslipidemia.  I did not check his lipids but his HDL was somewhat      low.  I am going to leave him on medication regimen and as he      increased his exercise, hopefully his HDL will rise.  We will check      it going forward.  3. Pleural effusion.  He probably does have some reaccumulation of      this, but he is not particularly symptomatic.  He is going to see      Dr. Prescott Gum at the beginning of December.  He will let me know if      he has any increasing shortness of breath at which point we would      re-image him.  4. Hypertension.  Blood pressure is slightly elevated.  We would just      made the med changes recently in the hospital.  We will keep with      this regimen and follow his blood pressures at home.  I have      instructed on him how to keep a blood pressure diary and bring this      with him.  5. Carotid stenosis.  The patient did have some carotid stenosis less      than 60% bilaterally by his report, though I do not have this      report.  He is to get that checked again in August 2010.  6. Mildly reduced ejection fraction.  We will continue to titrate his      meds in particularly the ACE inhibitor.  This will treat both his      hypertension and his cardiomyopathy.  7. Already he was given a flu vaccine today.  8. Followup.  I will see him back in the end of December or sooner if      needed.     [name], MD, [facility]  Electronically Signed    JH/MedQ  DD: 08/16/2008  DT: 08/17/2008  Job #: [id number]   cc:   Shanna Cisco., MD

## 2011-03-02 NOTE — Assessment & Plan Note (Signed)
OFFICE VISIT   [name], [name]  DOB:  1933-06-21                                        July 19, 2008  CHART #:  [id number]   CURRENT PROBLEMS:  1. Recurrent left pleural effusion status post CABG x4 August 2009 for      three-vessel coronary disease with reduced LV function.  2. Hypertension.   HISTORY PRESENT ILLNESS:  The patient is a 77 year old gentleman who has  had difficulty with a recurrent left pleural effusion after multivessel  coronary bypass surgery.  Earlier this summer, he has been treated with  thoracentesis x2 and most recently a prednisone taper.  He states his  breathing is better now.  He is going to rehab.  His anxious to resume  doing some yard work up.  He still has difficulty sleeping and requests  a hypnotic medication.   CURRENT MEDICATIONS:  His current medications remain unchanged including  aspirin, benazepril, Lopressor, clonidine, Avandamet, Lipitor, and he  will resume his diclofenac for arthritis.   PHYSICAL EXAMINATION:  Blood pressure 160/90, pulse 76 and regular,  respirations 18, saturation 98% on room air.  Breath sounds are equal.  Sternum is stable and cardiac rhythm is regular.  There is no peripheral  edema.   IMAGING:  AP and lateral chest x-ray shows some mild reaccumulation of  the left pleural effusion, but it is a small amount.  We will plan on  watching this with a followup x-ray in 2 weeks and will provide him with  a 10-day course of oral Lasix 40 mg a day.   Ivin Poot, M.D.  Electronically Signed   PV/MEDQ  D:  07/19/2008  T:  07/19/2008  Job:  [id number]   cc:   Shanna Cisco., MD  Signa Kell, MD, [facility]  [name], MD, [facility]

## 2011-03-02 NOTE — Cardiovascular Report (Signed)
NAME:  [name], [name] NO.:  [id number]   MEDICAL RECORD NO.:  [id number]         PATIENT TYPE:  JCAR   LOCATION:  6526                         FACILITY:  [facility]   PHYSICIAN:  [name], MD, FACCDATE OF BIRTH:  January 06, 1933   DATE OF PROCEDURE:  10/17/2008  DATE OF DISCHARGE:                            CARDIAC CATHETERIZATION   PRIMARY CARE PHYSICIAN:  Frankey Shown, MD   CARDIOLOGIST:  [name], MD, [facility]   PROCEDURE:  Left heart catheterization/coronary arteriography.   INDICATIONS:  Evaluate the patient with coronary artery disease, status  post bypass surgery earlier this year.  He presented with recurrent  unstable angina.   PROCEDURE NOTE:  Left heart catheterization was performed via the right  femoral artery.  The artery was cannulated using the anterior wall  puncture.  A #4-French arterial sheath was inserted via the modified  Seldinger technique.  Preformed Judkins and pigtail catheter were  utilized.  The patient tolerated the procedure well and left the lab in  stable condition.   RESULTS:  Hemodynamics; LV 159/18, AO 153/67.  Coronaries; left main had  long 50% stenosis.  The LAD had heavy calcification.  There was long mid  80% stenosis.  First diagonal was small, ostial 80% stenosis.  The  second diagonal was small.  The distal LAD filled via LIMA graft.  Circumflex had diffuse stenosis in the AV groove.  This was  nonobstructive.  There was long mid 50-60% stenosis.  Ramus intermediate  was small and normal.  First obtuse marginal had long proximal 90%  stenosis.  There was 99% stenosis at the anastomosis of the saphenous  vein graft.  Saphenous vein graft backfill the distal AV circumflex.  There was a posterolateral with long diffuse 80% stenosis.  Posterolateral filled via vein graft.  The right coronary artery was  occluded.  It was a dominant vessel.  There was a PDA, which was large  with mid 95% stenosis before the  anastomosis of the saphenous vein  graft.  The remainder of the vessel filled via the vein graft.  Grafts;  LIMA to the LAD was widely patent.  Saphenous vein graft to PDA had some  mild narrowing at the anastomosis.  Saphenous vein graft to the  posterolateral off of the right coronary artery was widely patent.  Saphenous vein graft to the obtuse marginal had 99% anastomotic stenosis  as described.  There was some mild proximal narrowing in this vein graft  as well.  Left ventriculogram; the left ventriculogram was obtained in  the RAO projection.  The EF was 55% with inferior akinesis.   CONCLUSION:  Severe three-vessel coronary artery disease.  Mild left  ventricular dysfunction.  Severe saphenous vein graft to OM stenosis.   PLAN:  The patient will have percutaneous revascularization of the  saphenous vein graft to the obtuse marginal and continued medical  management.      [name], MD, [facility]  Electronically Signed     JH/MEDQ  D:  10/17/2008  T:  10/17/2008  Job:  [id number]   cc:   Gwyndolyn Saxon  Donalee Citrin., MD

## 2011-03-02 NOTE — Assessment & Plan Note (Signed)
[facility] OFFICE NOTE   NAME:[name], [name]                         MRN:          [id number]  DATE:12/17/2008                            DOB:          12-26-1932    PRIMARY CARE PHYSICIAN:  Frankey Shown, MD   REASON FOR PRESENTATION:  Evaluate the patient with coronary artery  disease.   HISTORY OF PRESENT ILLNESS:  The patient presents for evaluation of the  above.  At the last visit, I did increase his spironolactone because he  was quite hypertensive.  He has tolerated this.  He got a BMET and that  demonstrated that his creatinine and potassium were fine.  Today, he  comes in and his blood pressure is still elevated.  He reports that his  home readings are much better, but still in the [id number] systolic.  He  does not have any lightheadedness, presyncope, or syncope.  He is not  having any chest discomfort, neck or arm discomfort.  He is having no  palpitations, presyncope, or syncope.  He does get winded if he tries to  exert himself.  Unfortunately, he is not walking as much as I would  like.  He quit cardiac rehab because he did not like to go.  He is  really not doing the exercise that I would suggest.   PAST MEDICAL HISTORY:  1. Coronary artery disease (left main long 50% stenosis, LAD calcified      with a long 80% stenosis, first diagonal had ostial 80% stenosis,      distal LAD filled via a LIMA graft, the circumflex had 50-60%      stenosis, the ramus intermedius was small and normal.  The first      obtuse marginal had 90% stenosis with a 99% stenosis at the      anastomosis of the saphenous vein graft, posterolateral had long      80% stenosis, the right coronary artery was occluded.  The PDA had      95% stenosis before the anastomosis of the vein graft.  LIMA to the      LAD was widely patent.  Saphenous vein graft to the PDA had some      mild narrowing at the anastomosis, saphenous  vein graft to the      posterolateral was widely patent, saphenous vein graft to the      obtuse marginal had 99% anastomotic stenosis.  The EF was 55% with      inferior akinesis.  He had a Taxus stent to the saphenous vein      graft to the obtuse marginal by Dr. Burt Knack in December 2009).  2. Mild aortic stenosis.  3. Recurrent pleural effusions.  4. Hypertension.  5. Dyslipidemia.  6. Diabetes mellitus.  7. Degenerative joint disease.  8. Radical prostatectomy for prostate cancer.  9. Lower esophageal stricture (status post dilatation).  10.Chronic left bundle branch block.   ALLERGIES:  None.   MEDICATIONS:  1. Clonidine 0.2 mg b.i.d.  2. Metoprolol 50 mg b.i.d.  3.  Benazepril 40 mg daily.  4. Lipitor 40 mg daily.  5. Klor-Con 40 mEq daily.  6. Plavix 75 mg daily.  7. Ecotrin 325 mg daily.  8. Celebrex 200 mg daily.  9. Spironolactone 25 mg daily.  10.Furosemide 20 mg daily.   REVIEW OF SYSTEMS:  As stated in the HPI and otherwise negative for  other systems.   PHYSICAL EXAMINATION:  GENERAL:  The patient is pleasant and in no  distress.  VITAL SIGNS:  Blood pressure 150/72, heart rate 49 and regular, weight  206 pounds, and body mass index 30.  HEENT:  Eyelids unremarkable; pupils are equal, round, and reactive to  light; fundi not visualized; oral mucosa unremarkable.  NECK:  No jugular venous distention at 45 degrees, carotid upstroke  brisk and symmetric, slight transmitted systolic murmur, no thyromegaly.  LYMPHATICS:  No cervical, axillary, or inguinal adenopathy.  LUNGS:  Clear to auscultation bilaterally.  BACK:  No costovertebral angle tenderness.  CHEST:  Unremarkable except for well-healed sternotomy scar.  HEART:  PMI not displaced or sustained; S1 and S2 within normal limits,  no S3, no S4; 2/6 apical systolic murmur radiating slightly at the  aortic outflow tract, no diastolic murmurs.  ABDOMEN:  Flat; positive bowel sounds, normal in frequency and  pitch; no  bruits, no rebound, no guarding, no midline pulsatile mass; no  hepatomegaly, no splenomegaly.  SKIN:  No rashes, no nodules.  EXTREMITIES:  Pulses 2+ throughout, no edema, no cyanosis, no clubbing.  NEURO:  Oriented to person, place, and time; cranial nerves II through  XII grossly intact; motor grossly intact.   ASSESSMENT AND PLAN:  1. Hypertension.  Blood pressure is still not well controlled.  I am      going to increase his spironolactone to 50 mg b.i.d.  We will check      a BMET in 2 weeks.  He tolerates the combination of ACE, potassium      supplementation, and spironolactone.  It seems he needs this amount      of potassium.  We will keep a close eye on this.  2. Coronary artery disease.  He is having no ongoing chest pain.  He      does get dyspneic with exertion.  However, this is not as previous      angina.  At this point, I think he needs continued aggressive risk      reduction.  I counseled him quite a bit (greater than half an hour)      on an exercise regimen and the benefits of this and hopefully he      will comply.  3. Dyslipidemia.  We will check this when he comes back, which has not      been done.  The goal will be an LDL less than 100 and HDL greater      than 40.  4. Obesity.  Hopefully, he will lose weight with diet and exercise.  5. Followup.  I will see him back in about 3 months or sooner if      needed.     [name], MD, [facility]  Electronically Signed    JH/MedQ  DD: 12/17/2008  DT: 12/17/2008  Job #: [id number]   cc:   Shanna Cisco., MD

## 2011-03-02 NOTE — Discharge Summary (Signed)
NAME:  COXFiliberto, Kister                  ACCOUNT NO.:  [id number]   MEDICAL RECORD NO.:  [id number]          PATIENT TYPE:  INP   LOCATION:  6526                         FACILITY:  [facility]   PHYSICIAN:  Juanda Bond. Burt Knack, MD  DATE OF BIRTH:  October 27, 1932   DATE OF ADMISSION:  10/17/2008  DATE OF DISCHARGE:  10/18/2008                               DISCHARGE SUMMARY   PROCEDURES:  1. Cardiac catheterization.  2. Coronary arteriogram.  3. Left ventriculogram.  4. Left internal mammary artery arteriogram.  5. Vein graft angiogram.   PRIMARY FINAL DISCHARGE DIAGNOSIS:  Unstable anginal pain, status post  percutaneous transluminal coronary angioplasty and drug-eluting stent to  the vein graft this admission.   SECONDARY DIAGNOSES:  1. Status post aortocoronary bypass surgery in August 2009 with left      internal mammary artery to left anterior descending , saphenous      vein graft to posterior descending artery, saphenous vein graft to      obtuse marginal, saphenous vein graft to posterolateral.  2. Status post radical prostatectomy for prostate cancer.  3. Preserved left ventricular function with an ejection fraction of      55% at cath, inferior akinesis seen.  4. History of recurrent pleural effusions after coronary artery bypass      grafting, chest x-ray on September 27, 2008, showing resolution.  5. Mild aortic stenosis with a valve mean gradient of 10 mmHg and an      aortic valve area of 1.8 sq. cm.  6. Diabetes.  7. Hypertension.  8. Dyslipidemia.  9. Degenerative joint disease.  10.Left bundle-branch block.  11.History of esophageal stricture on EGD with dilatation.  12.Family history of coronary artery disease and cerebrovascular      accident.   TIME OF DISCHARGE:  37 minutes.   HOSPITAL COURSE:  Mr. Hemsath is a 77 year old male with a history of  coronary artery disease.  He was seen in the office on October 14, 2008, with hypertension and chest pain consistent with  unstable angina.  He was admitted for further evaluation and treatment.   He was mildly hyponatremic with a sodium of 134.  His blood sugars were  minimally elevated at 130 and 133 (fasting).  He was taken to the Cath  Lab on October 17, 2008, and found to have severe native three-vessel  coronary artery disease and a severe stenosis in the SVG to OM.  Dr.  Burt Knack performed percutaneous intervention with drug-eluting stent of  the SVG to OM, reducing the stenosis from 90% to 0.   Post procedure, Mr. Hayden was ambulating without chest pain or shortness  of breath.  His blood pressure was slightly elevated still, but it was  felt that further medication adjustments could be made as an outpatient.  Mr. Broderson was evaluated by Dr. Burt Knack and considered stable for discharge  with close outpatient followup.   DISCHARGE INSTRUCTIONS:  1. He is to increase his activities slowly.  2. He is not to do any driving for 2 days and no lifting for a week.  3.  He is to call our office for any problems with the cath site.  4. He is to follow up with Dr. Percival Spanish and our office will call.  5. He is to follow up with Dr. Joni Fears as needed.   DISCHARGE MEDICATIONS:  1. Plavix 75 mg daily for at least a year.  2. Aspirin 325 mg daily.  3. Tylenol p.r.n.  4. Avandamet is on hold till October 20, 2008.  5. Diclofenac is on hold.  6. Clonidine 0.3 mg b.i.d.  7. Metoprolol 50 mg b.i.d.  8. Benazepril 20 mg b.i.d.  9. Lipitor 40 mg a day.  10.Klor-Con 20 mEq b.i.d.  11.Lasix 40 mg a day.  12.Nitroglycerin p.r.n.      Rosaria Ferries, PA-C      Juanda Bond. Burt Knack, MD  Electronically Signed    RB/MEDQ  D:  10/18/2008  T:  10/18/2008  Job:  [id number]   cc:   Shanna Cisco., MD

## 2011-03-02 NOTE — Op Note (Signed)
NAME:  [name], [name]                  ACCOUNT NO.:  [id number]   MEDICAL RECORD NO.:  [id number]          PATIENT TYPE:  INP   LOCATION:  2303                         FACILITY:  [facility]   PHYSICIAN:  Ivin Poot, M.D.  DATE OF BIRTH:  1933-02-17   DATE OF PROCEDURE:  05/27/2008  DATE OF DISCHARGE:                               OPERATIVE REPORT   OPERATION:  Coronary artery bypass grafting x4 (left internal mammary  artery to left anterior descending, saphenous vein graft to posterior  descending, saphenous vein graft to first obtuse marginal artery,  saphenous vein graft to posterolateral circumflex).   SURGEON:  Ivin Poot, MD   ASSISTANT:  Suzzanne Cloud, PA-C   ANESTHESIA:  General.   PREOPERATIVE DIAGNOSIS:  Class IV unstable angina with severe three-  vessel coronary artery disease and reduced left ventricular function.   POSTOPERATIVE DIAGNOSIS:  Class IV unstable angina with severe three-  vessel coronary artery disease and reduced left ventricular function.   INDICATIONS:  The patient is a 77 year old diabetic with progressive  chest tightness and shortness of breath.  Cardiac catheterization by Dr.  Lia Foyer demonstrated severe three-vessel coronary artery disease with  moderate reduction in LV function.  A 2-D echo showed no significant  valvular disease with global hypokinesia.  He is felt to be candidate  for surgical coronary revascularization.  He did receive Plavix just  before his cath and for that reason, a Plavix washout of 4 days was  allowed before scheduling surgery.   Prior to surgery, I reviewed results of cardiac catheterization with the  patient and his family.  I discussed the indications and expected  benefits of coronary artery bypass surgery for treatment of his coronary  artery disease.  I reviewed the alternatives to surgical therapy as  well.  I discussed with the patient the major aspects of the operation  including the location of the  surgical incisions, use of general  anesthesia and cardiopulmonary bypass, the expected possible recovery,  and the alternatives to surgery.  I reviewed with him the risks of this  operation including risks of MI, CVA, bleeding, infection, and death.  After reviewing these issues, he demonstrated his understanding and  agreed to proceed with operation under what I felt was an informed  consent.   OPERATIVE FINDINGS:  The patient has an enlarged heart from his  hypertension.  The exposure of the back to the heart was difficult due  to his body habitus of being short and obese.  The coronaries were under  deep layer of epicardial fat.  The 2-D echo showed global hypokinesia at  the onset of the operation, but after coming off bypass, he had  significant improvement in LV function.  The patient received a unit of  platelets after reversal of heparin with protamine due to Plavix-induced  coagulopathy.   PROCEDURE:  The patient was brought to the operating room and placed  supine on the operating table where general anesthesia was induced.  The  chest, abdomen, and legs were prepped with Betadine and draped as a  sterile  field.  A sternal incision was made, and the saphenous vein was  harvested endoscopically from both legs.  The left internal mammary  artery was harvested as a pedicle graft from its origin at the  subclavian vessels.  It was a good vessel with excellent flow.  Heparin  was administered after the veins had been harvested and examined and  found to be adequate.  The sternal retractor was placed and the  pericardium was suspended.  Pursestrings were placed in the ascending  aorta and right atrium, and the patient was then cannulated and placed  on bypass.  The coronaries were identified for grafting, and the mammary  artery and vein grafts were prepared for the distal anastomoses.  Cardioplegic catheters were placed for both antegrade and retrograde  cold blood cardioplegia.   The patient was then cooled to 32 degrees, and  the aortic cross-clamp was applied.  Cold blood cardioplegia 800 mL was  delivered in split doses between the antegrade aortic and retrograde  coronary sinus catheters.  There was good cardioplegic arrest and septal  temperature dropped less than 12 degrees.   The distal coronary anastomoses were then performed.  The first distal  anastomosis was to the posterior descending.  This was chronically  occluded proximally.  This was a 1.4-mm vessel.  A reverse saphenous  vein was sewn end-to-side with running 7-0 Prolene with good flow  through the graft.  The second distal anastomosis was to the OM1 branch  of the circumflex.  This was a 1.7-1.8-mm vessel with proximal 80%  stenosis.  It was intramyocardial.  A reverse saphenous vein was sewn  end-to-side with running 7-0 Prolene with good flow through the graft.  Cardioplegia was redosed.  The third distal anastomosis was to the  posterolateral branch of the distal circumflex.  This was a 1.5-mm  vessel with proximal 80% stenosis.  A reverse saphenous vein was sewn  end-to-side with running 7-0 Prolene with good flow through the graft.  The fourth distal anastomosis was to the distal third of the LAD.  This  was a 1.5-mm vessel and a proximal 80% stenosis.  The left IMA pedicle  was brought through an opening created, and the left lateral pericardium  was brought down onto the LAD and sewn end-to-side with running 8-0  Prolene.  There was good flow through the anastomosis after briefly  releasing the pedicle bulldog on the mammary artery.  The bulldog was  reapplied, and the pedicle was secured to the epicardium.  Cardioplegia  was redosed.   While the cross-clamp was still in place, 3 proximal vein anastomoses  were performed on the ascending aorta using a 4.0-mm punch with running  7-0 Prolene.  Prior to tying down the final proximal anastomosis, air  was vented from the coronaries with a  dose of retrograde warm blood  cardioplegia.   The cross-clamp was removed, and the heart was reperfused.  It was  cardioverted back to a regular rhythm.  Air was aspirated from the vein  grafts with a 27-gauge needle.  The cardioplegia catheters were removed.  The grafts were checked and each had good flow, and hemostasis was  documented at the proximal and distal anastomoses.  The patient was  rewarmed to 37 degrees.  Temporary pacing wires were applied.  When the  patient was rewarmed and reperfused, the lungs were re-expanded.  The  ventilator was resumed.  The patient was then weaned off bypass on a low-  dose dopamine  and milrinone.  Cardiac output blood pressure were normal.  Protamine was administered without adverse reaction.  The cannulas were  removed.  The mediastinum was irrigated with warm antibiotic irrigation.  The leg incision was irrigated and closed in a standard fashion.  The  superior pericardial fat was closed over the aorta.  Two mediastinal and  a left pleural chest tube were placed and brought out through separate  incisions.  The sternum was closed with interrupted steel wire.  The  pectoralis fascia and subcutaneous layers were closed in running Vicryl.  The skin was closed with a subcuticular and sterile dressings were  applied.  Total bypass time was 141 minutes with cross-clamp time of 92  minutes.      Ivin Poot, M.D.  Electronically Signed    PV/MEDQ  D:  05/27/2008  T:  05/28/2008  Job:  [id number]   cc:   Loretha Brasil. Lia Foyer, MD, [facility]

## 2011-03-02 NOTE — H&P (Signed)
[name]   NAME:[name]                         MRN:          [id number]  DATE:05/22/2008                            DOB:          1933-06-10    PRIMARY CARDIOLOGIST:  [name].   PRIMARY CARE PHYSICIAN:  Dr. Tempie Hoist.   CHIEF COMPLAINT:  Chest pain.   HISTORY OF PRESENT ILLNESS:  This is a 77 year old married white male  patient with long history of chest pain.  He most recently had a stress  Myoview in February 2009 which showed a small defect in the anterior  septum, only at the base, probably not significant, no ischemia  suggested, echo to correlate EF.  Echo at that time showed normal LV  function, EF 55% to 60%, no wall motion abnormalities. He had mild AS,  mild MR, left atrium was mildly dilated.  He then underwent esophageal  dilatation and had an old peptic ulcer and has been doing fine. Now for  the past 5 weeks he complains of a new type of chest pain he describes  as a tightening that goes into his neck associated with shortness of  breath and diaphoresis.  This occurs only with exertion and as quickly  as 3-4 minutes into exertion.  This occurs with walking up a flight of  stairs and he actually had some walking into the office today, but it  did not get as severe as it sometimes does. He says it eases on its own  within 5 minutes. This has been happening daily for 5 weeks.  He saw Dr.  Joni Fears this morning who in turn called [place] and brought him in today.   ALLERGIES:  No known drug allergies.   CURRENT MEDICATIONS:  1. Diclofenac 25 mg two daily.  2. Amlodipine benazepril does 10/20 daily.  3. Clonidine 1 mg b.i.d.  4. Avandamet 4 mg b.i.d.   PAST MEDICAL HISTORY:  Significant for diabetes mellitus,  hyperlipidemia, hypertension, arthritis, recent prostate cancer and  surgery.  He then had to have an erectile pump placed. He had surgery  for rectal tear,  carpal tunnel surgery, bilateral hernia repair.  He has  recently had an esophageal stricture dilated and peptic ulcer, chronic  back pain, and surgery.   FAMILY HISTORY:  Father died 85 of an MI.  Mother died 73 of cancer.  One brother had CVA after undergoing knee replacement.   SOCIAL HISTORY:  He is married.  He is retired.  He quit smoking 40  years ago.   REVIEW OF SYSTEMS:  Negative for dizziness or presyncopal signs or  symptoms, dyspepsia, dysphagia, nausea, vomiting, change in bowels or  melena.  CARDIOPULMONARY:  See HPI.   PHYSICAL EXAM:  This is a pleasant 77 year old white male with unstable  angina.  Blood pressure 157/90, pulse 93, weight 208.  HEENT: Head is normocephalic __________.  Extraocular movements are  intact.  Pupils are equal and reactive to light and accommodation.  Nasal mucosa is moist.  Throat is without erythema or exudate.  NECK:  Neck is without JVD, HJR, or bruit or thyroid enlargement.  LUNGS: Clear anterior posterior lateral.  HEART: Regular rate and rhythm at 93 beats per minute, normal S1 and S2,  no murmur, rub, bruit, thrill or heave noted.  ABDOMEN: Soft without organomegaly, masses, lesions or abnormal  tenderness.  EXTREMITIES: Without cyanosis, clubbing or edema, has good distal  pulses.  NEUROLOGICAL EXAM:  Neurological exam was without focal deficit.   EKG normal sinus rhythm, left bundle branch block.   IMPRESSION:  1. Exertional chest pain worrisome for ischemia.  2. Adenosine Myoview in February 2009.  A small defect in the anterior      septum only at the base.  No ischemia, ejection fraction 50%.  3. Mild aortic stenosis and mitral regurgitation.  4. Left bundle branch block.  5. Hypertension.  6. Hyperlipidemia  7. Family history of coronary artery disease.  8. Diabetes mellitus.  9. Arthritis.  10.Prostate cancer, status post surgery.  11.Recent esophageal stricture dilated and peptic ulcer.   PLAN:  We will admit  the patient to the hospital today, begin  intravenous heparin and schedule him for a cath tomorrow morning with  Dr. Olevia Perches or Dr. Lia Foyer. Risks and benefits have been explained in  detail to the patient who is agreeable      Ermalinda Barrios, PA-C  Electronically Signed      [name], MD, [facility]  Electronically Signed   ML/MedQ  DD: 05/22/2008  DT: 05/22/2008  Job #: [id number]

## 2011-03-02 NOTE — Cardiovascular Report (Signed)
NAME:  [name], [name]                  ACCOUNT NO.:  [id number]   MEDICAL RECORD NO.:  [id number]          PATIENT TYPE:  INP   LOCATION:  2023                         FACILITY:  [facility]   PHYSICIAN:  Loretha Brasil. Lia Foyer, MD, FACCDATE OF BIRTH:  07-Oct-1933   DATE OF PROCEDURE:  05/23/2008  DATE OF DISCHARGE:                            CARDIAC CATHETERIZATION   ADDENDUM   The subclavian artery demonstrates mild irregularity.  There is  antegrade filling of the vertebral.  The internal mammary appears to be  patent and usable for bypass grafting.      Loretha Brasil. Lia Foyer, MD, [facility]  Electronically Signed     TDS/MEDQ  D:  05/23/2008  T:  05/24/2008  Job:  [id number]

## 2011-03-02 NOTE — H&P (Signed)
NAME:  [name], [name]                  ACCOUNT NO.:  [id number]   MEDICAL RECORD NO.:  [id number]          PATIENT TYPE:  INP   LOCATION:  2303                         FACILITY:  [facility]   PHYSICIAN:  Glynda Jaeger, M.D.  DATE OF BIRTH:  1932-11-10   DATE OF ADMISSION:  05/22/2008  DATE OF DISCHARGE:                              HISTORY & PHYSICAL   PROCEDURE:  Intraoperative transesophageal echocardiography.   [name] is a 77 year old white male with a history of progressive  angina, 3-vessel coronary artery disease, and mild aortic stenosis.  He  is scheduled to undergo coronary artery bypass grafting by Dr. Tharon Aquas  Trigt.  Intraoperative transesophageal echocardiography was requested to  evaluate the left ventricular function and to evaluate the aortic valve  and to assess for any other valvular pathology.   The patient was brought to the operating room of [facility] and  general anesthesia was induced without difficulty.  The trachea was  intubated without difficulty.  The transesophageal echocardiography  probe was then inserted into the esophagus without difficulty.   IMPRESSION:  Prebypass findings:  1. Aortic valve.  There was a trileaflet aortic valve with thickened      leaflets and mildly reduced opening.  There was mild calcification      in the leaflets and there was no evidence of aortic insufficiency.      Continuous wave Doppler interrogation of the lower left ventricular      outflow tract and aortic valve revealed a peak velocity of 1.6      meters per second, maximum gradient, instantaneous gradient of 10      mmHg, and mean gradient of 7.5 mmHg.  This was consistent with mild      aortic stenosis.  2. Mitral valve.  The mitral valve showed mild mitral annular      calcification, normal opening of the leaflets and normal coaptation      of the leaflets with trace mitral insufficiency.  This was      confirmed in multiple views.  3. Left ventricle.   There was moderate left ventricular dysfunction.      At the mid-papillary short-axis view, there was hypokinesis at the      inferior wall with normal contractility of the anterior and lateral      walls.  However, evaluation of the long axis in 2 chamber views      revealed akinesis of the anterior apical regions and inferior and      septal regions.  There was no thrombus noted in the left      ventricular apex.  There was mild thinning of the inferior wall.      Ejection fraction was estimated at 40%.  Left ventricular wall      thickness measured 1.35 of the anterior wall at the mid-papillary      level at the end-diastole.  Left ventricular end-diastolic diameter      measured 5.5 cm.  4. Right ventricle.  The right ventricular size was normal.  There was  good contractility of the right ventricular free wall.  5. Tricuspid valve.  The tricuspid valve appeared structurally normal      with trace tricuspid insufficiency.  6. Interatrial septum.  There was increased mobility of the      interatrial septum, but this did not reach the criteria for atrial      septal aneurysm.  There was no evidence of atrial septal defect      with patent foramen ovale by color Doppler or bubble study.  7. Left atrium.  There was thrombus in the left atrium and left atrial      appendage.  8. Ascending aorta.  There was thickening of the ascending aorta, but      no mobile thrombus noted.  9. Descending aorta.  The descending aorta showed mild-to-moderate      atherosclerotic changes and diameter measured 2.95 cm.   Post-bypass findings:  1. Aortic valve.  The aortic valve was unchanged from the prebypass      study.  There was thickening of leaflets with mildly decreased      opening, but no aortic insufficiency.  2. Mitral valve.  The mitral valve was unchanged from the prebypass      study.  There was trace mitral insufficiency with good coaptation      of the leaflets and normal opening.   3. Left ventricle.  The left ventricular function appeared improved      from the prebypass study with more vigorous contractility and some      contractility noted in the inferior wall as well.  Ejection      fraction estimated at 45%.  4. Right ventricle.  The right ventricular size appeared normal.      There was good contractility of the right ventricular free wall.           ______________________________  Glynda Jaeger, M.D.     DCJ/MEDQ  D:  05/27/2008  T:  05/28/2008  Job:  [id number]   cc:   Glynda Jaeger, M.D.

## 2011-03-02 NOTE — Assessment & Plan Note (Signed)
[facility] OFFICE NOTE   NAME:[name], [name]                         MRN:          [id number]  DATE:11/05/2008                            DOB:          1932/12/31    PRIMARY CARE PHYSICIAN:  Frankey Shown, MD   REASON FOR PRESENTATION:  Evaluate the patient with coronary artery  disease status post recent PCI.   HISTORY OF PRESENT ILLNESS:  The patient returns for followup.  At the  last visit, he was having unstable angina.  I performed a  catheterization, which demonstrated left main long 50% stenosis, the LAD  was calcified with long 80% stenosis, first diagonal had ostial 80%  stenosis, distal LAD filled the LIMA graft, the circumflex had 50-60%  stenosis, ramus intermediate was small and normal, first obtuse marginal  had 90% stenosis with 99% stenosis at the anastomosis of the saphenous  vein graft, posterolateral had long 80% stenosis, the right coronary  artery was occluded.  The PDA had 95% stenosis before the anastomosis of  the vein graft.  LIMA to the LAD was widely patent, saphenous vein graft  to the PDA had some mild narrowing at the anastomosis, saphenous vein  graft to the posterolateral was widely patent, saphenous vein graft to  the obtuse marginal had 99% anastomotic stenosis.  The EF was 55% with  inferior akinesis.  The patient subsequently had Taxus stenting of the  saphenous vein graft to the obtuse marginal by Dr. Burt Knack.   The patient states that since that time, he has had much improvement in  his symptoms.  He is no longer getting the exertional chest pain that  was similar to his previous angina.  He has had some sporadic chest  discomfort for which he will occasionally take a nitroglycerin.  He  states this is an atypical left-sided discomfort.  It goes away easily  after 1 nitroglycerin.  He cannot bring it on with exertion.  He has had  no associated symptoms, nausea,  vomiting, or diaphoresis.   His blood pressure has remained elevated.  He takes it at home, and his  systolics are often in the 170s.  He is having some knee pain because he  has not been on his diclofenac.  He also states that he has had some  decreased energy, but he has not sleeping well at night.  He tried to  take some prescription that Dr. Prescott Gum gave him after surgery, but  this did not work.  He is not sure about the name of this drug.   PAST MEDICAL HISTORY:  Coronary artery disease as described above, now  status post Taxus stenting to the saphenous vein graft, recurrent  pleural effusions, mild aortic stenosis, hypertension, dyslipidemia,  diabetes mellitus, degenerative joint disease, radical prostatectomy for  prostate cancer, lower esophageal stricture (status post dilatation),  and chronic left bundle-branch.   ALLERGIES/INTOLERANCES:  None.   MEDICATIONS:  1. Clonidine 0.2 mg b.i.d.  2. Metoprolol 50 mg b.i.d.  3. Benazepril 40 mg daily.  4. Lipitor 40 mg  daily.  5. Klor-Con 40 mEq daily.  6. Furosemide 40 mg daily.  7. Plavix 75 mg daily.  8. Ecotrin 325 mg daily.   REVIEW OF SYSTEMS:  As stated in the HPI, and otherwise negative for  other systems.   PHYSICAL EXAMINATION:  GENERAL:  The patient is in no distress.  VITAL SIGNS:  Blood pressure 190/81, heart rate 53 and regular, weight  209 pounds, body mass index 30.  HEENT:  Eyelids unremarkable; pupils equal, round, and reactive to  light; fundi not visualized; oral mucosa unremarkable.  NECK:  No jugular venous distension at 45 degrees; carotid upstroke  brisk and symmetric; no bruits, no thyromegaly.  LYMPHATICS:  No cervical, axillary, or inguinal adenopathy.  LUNGS:  Decreased breath sounds bilateral bases, left greater than  right; no crackles, no wheezing; mild dullness to percussion left base.  BACK:  No costovertebral angle tenderness.  CHEST:  Well-healed sternotomy scar.  HEART:  PMI not  displaced or sustained; S1 and S2 within normal limits;  no S3, no S4; no clicks, no rubs, no murmurs.  ABDOMEN:  Mildly obese; positive bowel sounds, normal in frequency and  pitch; no bruits, no rebound, no guarding; no midline pulsatile mass; no  hepatomegaly, no splenomegaly.  SKIN:  No rashes, no nodules.  EXTREMITIES:  Pulses 2+ throughout; no edema, no cyanosis, no clubbing.  NEURO:  Oriented to person, place, and time; cranial nerves II through  XII grossly intact; motor grossly intact.   EKG; sinus rhythm, left bundle branch-block, rightward axis, no acute ST-  T wave changes.   ASSESSMENT AND PLAN:  1. Coronary artery disease.  The patient is having no new symptoms.      He has had improvement in his symptoms since his PCI.  He will      continue on the Plavix for at least a year.  He is having some      atypical chest pain, which does go away with nitroglycerin.  He can      continue with this.  2. Hypertension.  Blood pressure is not controlled.  I am going to add      spironolactone 25 mg daily.  We will get a BMET in 1 week and again      in 1 month.  I know to be careful with his potassium given the fact      that he is on potassium and an angiotensin-converting enzyme      inhibitor.  We will watch this closely.  I am going to reduce his      diuretic, Lasix, as he may not require as much.  Again, we will      need to watch his potassium carefully.  He will watch his blood      pressure at home.  3. Dyslipidemia.  We will check this again in the weeks to come with a      goal LDL less than 70 and HDL greater than 40.  4. Pleural effusions.  He seems to have improved.  No further      evaluation is warranted.  5. Diabetes, per Dr. Joni Fears.  6. Knee pain.  We discussed using Tylenol and chondroitin sulfate.  7. Insomnia.  The patient is having some problems with sleep, and I      did take the liberty of prescribing Ambien 5 mg daily.  He will      need to get this  from Dr. Joni Fears ongoing if it  works and he needs      it.  This may also explain his fatigue.  However, pay attention to      his fatigue in the future if it is not improved yet his sleep      patterns have.  8. Followup.  I will see him back in about 2 months or sooner if      needed.     [name], MD, [facility]  Electronically Signed    JH/MedQ  DD: 11/05/2008  DT: 11/06/2008  Job #: [id number]   cc:   Shanna Cisco., MD

## 2011-03-02 NOTE — Cardiovascular Report (Signed)
NAME:  [name], [name]                  ACCOUNT NO.:  [id number]   MEDICAL RECORD NO.:  [id number]          PATIENT TYPE:  INP   LOCATION:  2023                         FACILITY:  [facility]   PHYSICIAN:  Loretha Brasil. Lia Foyer, MD, FACCDATE OF BIRTH:  [birth year]   DATE OF PROCEDURE:  05/23/2008  DATE OF DISCHARGE:                            CARDIAC CATHETERIZATION   INDICATIONS:  Mr. Donahoo is a 77 year old gentleman who presents with  exertional angina.  He has had this for about 4 weeks.  Activity brings  it on and rest relieves it.  It is fairly classic.  He was therefore  seen, admitted and set up for cardiac catheterization.  EKG revealed  left bundle branch block which is new since last tracing of 1995.  Risks  and benefits were discussed with the patient and he agreed to proceed.   PROCEDURE:  1. Left heart catheterization.  2. Selective coronary arteriography.  3. Selective left ventriculography.  4. Subclavian angiography.   DESCRIPTION OF PROCEDURE:  The procedure was performed from the right  femoral artery without complications and standard Judkins catheters were  utilized.  Subclavian angiography was performed because of need for  internal mammary use.  He tolerated the procedure well and was taken to  the holding area in satisfactory clinical condition.  Because of  elevated pressure, he was given 5 mg of intravenous Lopressor.   HEMODYNAMIC DATA:  1. Central aortic pressure 158/85, mean 114.  2. Left ventricular pressure 154/16.  3. No gradient pullback across aortic valve.   ANGIOGRAPHIC DATA:  1. On plain fluoroscopy, there was heavy calcification of the coronary      arteries.  2. Ventriculography in the RAO projection reveals an overall ejection      fraction at an estimated range of 40-45%.  The inferior wall was      clearly down with more severe hypokinesis.  The anterolateral wall      appears modestly hypokinetic.  3. The left anterior descending artery is a heavily  calcified vessel.      There was diffuse segmental plaque throughout the vessel with the      worst area being in two specific locations, one in the midvessel      with about 70% narrowing followed by about an 80% stenosis just      after the takeoff of the third diagonal.  Just prior to the third      diagonal was about 40-50% narrowing.  The distal LAD is somewhat      smaller in caliber but should be suitable for grafting.  4. The circumflex provides a small intermediate vessel that is not      significant.  The mid circumflex has about 40-50% narrowing best      seen in the LAO projection.  The first marginal has about 50-70%      narrowing.  The second marginal is a small vessel with 60-70%      narrowing and the third marginal or first large posterolateral      branch has 70-80% narrowing along and a very  large-caliber vessel.  5. The right coronary artery is heavily calcified, and totally      occluded.  The vessel slightly reconstitutes distally, but mostly      fills by collaterals from left-to-right.  It does appear to be      graftable.   CONCLUSIONS:  1. Moderate reduction in overall left ventricular function with an      estimated ejection fraction in the 40-45% range and severe inferior      hypokinesis.  2. Heavily calcified and totally occluded right coronary artery with      evidence of some bridging collaterals.  There is left-to-right      collaterals as well.  3. There are tandem lesions in a heavily calcified left anterior      descending artery.  4. The most severe circumflex lesion is in the first posterolateral      segment, and there is mild disease in the more proximal circumflex      marginal.   DISPOSITION:  We will review with our colleagues and I have discussed  with Dr. Percival Spanish.  My leaning is towards consideration of  revascularization surgery given his diabetes and the extent of his  disease, as well as his symptomatology.      Loretha Brasil.  Lia Foyer, MD, [facility]  Electronically Signed     TDS/MEDQ  D:  05/23/2008  T:  05/24/2008  Job:  [id number]   cc:   [name], MD, [facility]  Shanna Cisco., MD  [facility]

## 2011-03-02 NOTE — Cardiovascular Report (Signed)
NAME:  [name], [name]                  ACCOUNT NO.:  [id number]   MEDICAL RECORD NO.:  [id number]          PATIENT TYPE:  INP   LOCATION:  6526                         FACILITY:  [facility]   PHYSICIAN:  Juanda Bond. Burt Knack, MD  DATE OF BIRTH:  1932-11-26   DATE OF PROCEDURE:  10/17/2008  DATE OF DISCHARGE:                            CARDIAC CATHETERIZATION   PROCEDURE:  Percutaneous transluminal coronary angioplasty and stenting  of the saphenous vein graft to obtuse marginal artery.   INDICATION:  Mr. Hubers is a 77 year old gentleman with progressive angina.  He has known CAD and underwent prior bypass surgery.  He underwent  diagnostic catheterization by Dr. Percival Spanish this morning.  This  demonstrated severe stenosis at the distal anastomosis of the saphenous  vein graft to OM branch.  After review of his films, we elected to  proceed with PCI.  A 4-French sheath was left in the femoral artery.  The patient was given 3000 units to heparin and 600 mg of Plavix.  He  was brought upstairs for coronary intervention.  Risks and indications  of the procedure were reviewed in detail with the patient and his  family.   Using normal sterile technique, the 4-French sheath was changed out for  a 6-French sheath in the right femoral artery.  Angiomax was used for  anticoagulation.  Once the therapeutic ACT was achieved, a 6-French LCB  guide catheter was attempted.  This would not reach the graft.  I then  used an AL1 catheter for the initial part of the procedure.  A Cougar  guidewire was advanced easily beyond the area of stenosis.  The lesion  was predilated with a 2.0 x 12 mm Voyager balloon, which was taken to 6  atmospheres.  Following predilatation, I attempted to stent the area  with a 2.5 x 15 mm Xience V stent.  My plan was to stent from the severe  lesion at the distal graft coronary anastomosis across the diseased area  in a tortuous aspect of the native OM.  The stent would not pass after  multiple attempts and guide position was lost.  I then changed out to an  AL2 guide for better support.  The AL2 fit very well with good back wall  support against the contralateral aorta.  The same Cougar guidewire was  passed across the lesion, and the stent passed fairly easily.  The stent  was then deployed at 12 atmospheres and appeared to be well expanded  with some mild residual stenosis in the midportion of the stent.  I then  made a prolonged attempt at postdilatation, but I was never able to pass  a balloon.  First 2.75 x 12 mm Voyager [place] balloon was attempted.  After  multiple attempts, I was unable to pass this.  I tried to wean the  balloon with a low-pressure inflation at the proximal aspect of the  stent but it still would not pass beyond the proximal stent.  I then  tried a normal compliant Voyager balloon and it also would not pass.  I  rewired the  vessel on multiple occasions to make sure I was not behind  any stent struts.  Finally, I tried a 2.75 x 12 mm Sprinter balloon and  it also would not pass.  I again rewired the vessel and it still would  not pass.  I did some balloon inflations at the proximal aspect of the  stent again to try to wean the balloon but this did not help.  At the  completion of the procedure, there was a good angiographic result.  There was 10-20% residual stenosis in the midportion of the stent but  the stent overall appeared well expanded, and there was TIMI-3 flow in  the vessel.  The patient was chest pain free.  He was transferred to the  holding area in stable condition.   ASSESSMENT:  Successful percutaneous coronary intervention of the  saphenous vein graft to obtuse marginal artery using a single drug-  eluting stent.   RECOMMENDATIONS:  Aspirin and Plavix for a minimum of 12 months.      Juanda Bond. Burt Knack, MD  Electronically Signed     MDC/MEDQ  D:  10/17/2008  T:  10/18/2008  Job:  [id number]

## 2011-03-02 NOTE — Discharge Summary (Signed)
NAME:  JAAMAL, SANDRA                  ACCOUNT NO.:  [id number]   MEDICAL RECORD NO.:  [id number]          PATIENT TYPE:  INP   LOCATION:  2019                         FACILITY:  [facility]   PHYSICIAN:  Ivin Poot, M.D.  DATE OF BIRTH:  [birth year]   DATE OF ADMISSION:  05/22/2008  DATE OF DISCHARGE:                               DISCHARGE SUMMARY   FINAL DIAGNOSIS:  Class IV unstable angina with severe three-vessel  coronary artery disease and reduced left ventricular function.   IN-HOSPITAL DIAGNOSES:  1. Postoperative ileus.  2. Volume overload postoperatively.  3. Acute blood loss anemia postoperatively.   SECONDARY DIAGNOSES:  1. Diabetes mellitus.  2. Hypertension.  3. Hyperlipidemia.  4. Degenerative arthritis.  5. Status post radical prostatectomy for prostate cancer.  6. Lower esophageal stricture recently successfully dilated due to      acid peptic disease.   IN-HOSPITAL OPERATIONS AND PROCEDURES:  1. Cardiac catheterization.  2. Intraoperative transesophageal echocardiogram.  3. Coronary artery bypass grafting x4 using a left internal mammary      artery to left anterior descending, saphenous vein graft to      posterior descending, saphenous vein graft to first obtuse marginal      artery, and saphenous vein graft to posterior lateral circumflex.   HISTORY AND PHYSICAL AND HOSPITAL COURSE:  The patient is a 77 year old  diabetic with progressive chest tightness and shortness of breath.  Cardiac catheterization by Dr. Lia Foyer demonstrates severe three-vessel  coronary artery disease with moderate reduction in LV function.  A 2-D  echocardiogram showed no significant valvular disease with global  hypokinesia.  The patient is felt to be a candidate for surgical  revascularization.  The patient was seen and evaluated by Dr. Prescott Gum.  Dr. Prescott Gum discussed with the patient undergoing coronary artery  bypass grafting.  He discussed the risks and benefits with the  patient.  The patient acknowledged understanding and agreed to proceed.  The  patient did receive Plavix prior to his catheterization.  Feels that he  was stable enough to proceed with pelvic washout for 4 days prior to  surgery.  He was scheduled for surgery for May 27, 2008.  Preoperatively, the patient remained stable.  He underwent preoperative  bilateral carotid duplex ultrasounds showing to be 40-60% ICA stenosis  bilaterally.  He also had preoperative ABIs showing to be greater than  1.0.  Otherwise, the patient remained stable prior to surgery.  For  details of the patient's past medical history and physical exam, please  see dictated H&P.   The patient was taken to the operating room on May 27, 2008, by Dr.  Prescott Gum where he underwent coronary artery bypass grafting x4 using a  left internal mammary artery to left anterior descending, saphenous vein  graft to posterior descending, saphenous vein graft to first obtuse  marginal artery, and saphenous vein graft to posterior lateral  circumflex.  The patient tolerated this procedure and was transferred to  the Intensive Care Unit in stable condition.  Postoperatively, the  patient was noted to be hemodynamically stable.  He was able to be  extubated evening of surgery.  Post extubation, the patient noted to be  alert and oriented x4, and neuro intact.  Postoperatively, PA and  lateral chest x-ray obtained, which was clear.  The patient had minimum  drainage from chest tubes and chest tubes were discontinued in normal  fashion.  The patient was encouraged to use incentive spirometer, and he  was able to be weaned off oxygen with O2 sats greater than 90% by postop  day #3.  Repeat chest x-ray following removal of chest tubes remained  stable.  From a cardiac standpoint, the patient did require several  drips.  These were all able to be weaned and discontinued.  A Swan-Ganz  catheter was discontinued in normal fashion.  The  patient's heart rate  was noted to be in normal sinus rhythm.  The patient was noted to be  slightly hypertensive postoperatively.  He was restarted on clonidine  and Lopressor postoperatively.  The patient's blood pressure continued  to remain hypertensive and he was restarted on his ACE inhibitor and  Lopressor dose increased.  This did assist in lowering the patient's  blood pressure.  During this time, the patient's heart rate remained  stable, and he remained in normal sinus rhythm.  Postoperatively, the  patient was noted to have a mild ileus.  His abdomen was distended and  tender.  He had good bowel sounds in all 4 quadrants and denied  significant nausea or vomiting.  The patient was placed on clear liquid  diet.  By postop day #3, the patient was able to have a bowel movement  and the diet was advanced.  The patient was tolerating regular diet well  prior to discharge home.  Ileus had resolved.  Postoperatively, the  patient did have some mild acute blood loss anemia.  Hemoglobin/hematocrit was 9.5 and 27.9 on postop day #1.  The patient  was asymptomatic.  No transfusions were required.  Hemoglobin/hematocrit  were followed and remained stable.  The patient did have some volume  overload postoperatively.  He was started on diuretics.  Daily weights  obtained and volume overload was improving slowly.  We will plan to  continue diuretics as an outpatient.  Postoperatively, the patient was  up ambulating with Cardiac Rehab.  He was tolerating this well.  All  incisions were noted to be clean, dry, and intact and healing well  postoperatively.   Postop day #3, the patient was noted to be afebrile.  He was in normal  sinus rhythm with heart rate in the 80s.  He was sating 96% on room air.  Blood pressure slightly elevated at 130s/70s.  He was in normal sinus  rhythm.  Pulmonary status is stable.  He is still 9 kg above his  preoperative weight.   LABORATORY DATA:  White count  17.7, hemoglobin of 10, hematocrit 29.3,  and platelet count 254.  Sodium of 136, potassium 3.9, chloride of 103,  bicarb of 24, BUN of 16, creatinine 0.89, and glucose of 109.   The patient is diabetic and was initially started on Lantus insulin  postoperatively.  Blood sugars were followed.  Once the patient started  eating and creatinine noted to be stable, he was restarted on his  Avandamet.  Lantus insulin was discontinued.  Blood sugars did remain  stable.  The patient is tentatively ready for discharge home in the next  24-48 hours pending he remained stable.   FOLLOWUP APPOINTMENTS:  Followup  appointment has been arranged with Dr.  Prescott Gum for June 21, 2008, at 12 p.m.  The patient will need to  obtain PA and lateral chest x-ray 30 minutes prior to this appointment.  The patient will need to follow up with Dr. Percival Spanish in 2 weeks.  He  need to contact his office to make these arrangements.   ACTIVITY:  The patient was instructed no driving until he agrees to do  so, no lifting over 10 pounds.  He is told to ambulate 3-4 times per  day, progress as tolerated, and continue his breathing exercises.   INCISIONAL CARE:  The patient was told to shower, washing his incisions  using soap and water.  He is to contact the office if he develops any  drainage or opening from any of his incision sites.   DIET:  The patient is educated on diet to be low fat, low salt, as well  as carbohydrate, with modified medium calorie diet.   DISCHARGE MEDICATIONS:  1. Clonidine 0.1 mg b.i.d.  2. Avandamet 4 mg/500 mg b.i.d.  3. Lopressor 50 mg b.i.d.  4. Lasix 40 mg daily x5 days.  5. Potassium chloride 20 mEq daily x5 days.  6. Benazepril 20 mg daily.  7. Darvocet-N 100 one to two tablets q.4-6 h. p.r.n.      Darlin Coco, [place]      Ivin Poot, M.D.  Electronically Signed    KMD/MEDQ  D:  05/30/2008  T:  05/31/2008  Job:  [id number]   cc:   [name], MD, [facility]  Ivin Poot, M.D.

## 2011-03-02 NOTE — H&P (Signed)
NAME:  [name], [name]                  ACCOUNT NO.:  [id number]   MEDICAL RECORD NO.:  [id number]          PATIENT TYPE:  INP   LOCATION:  6531                         FACILITY:  [facility]   PHYSICIAN:  Loretha Brasil. Lia Foyer, MD, FACCDATE OF BIRTH:  11/28/1932   DATE OF ADMISSION:  08/07/2008  DATE OF DISCHARGE:                              HISTORY & PHYSICAL   HISTORY OF PRESENT ILLNESS:  This is a 77 year old Caucasian male with  recent coronary artery bypass grafting in August 2009 with LIMA to LAD,  SVG to PDA, SVG to OM1, and SVG to posterolateral secondary to severe  three-vessel coronary artery disease per catheterization in August 2009,  who had a pleural effusion postoperatively and had a thoracentesis per  Dr. Prescott Gum x2 since discharge from the hospital postoperatively.  The  patient after having had second thoracentesis, was started on prednisone  tapering and also was placed on Lasix 40 mg with potassium for 10 days,  and the patient was doing well up until about 10 days ago when he  finished the Lasix course and the prednisone course and slowly began to  have increased shortness of breath.  The patient was followed by Dr. Prescott Gum and was also given Darvocet for chest discomfort as he was feeling  some pressure there.  The patient states last night while sitting on his  deck, the pressures and pain suddenly went away, but he did continue to  have shortness of breath which became more apparent over the last 2  days.  The patient went to cardiac rehab today and was unable to do any  of his exercises secondary to shortness of breath.  The patient had  blood pressure done prior to exercise and was found to be 217/123.  I  was notified by phone by cardiac rehab nurse and the patient was advised  to be brought to the emergency room.  On arrival to the emergency room,  the patient remained hypertensive with a blood pressure of 220/120.  He  was given 1 sublingual nitroglycerin and his  blood pressure came down to  197.  I started the patient on a nitroglycerin drip and had the patient  monitored closely.   The patient states that after stopping the Lasix approximately 10 days  ago, he noticed a slow increase in fluid accumulation in lower  extremities along with increasing shortness of breath that became worse  prior to admission, although he did come to cardiac rehab.   REVIEW OF SYSTEMS:  Positive for chest pain, shortness of breath,  dyspnea on exertion x1 week, and urinary frequency.   PAST MEDICAL HISTORY:  1. Hypertension, difficult to control and labile.  2. CAD.      a.     Status post cardiac catheterization completed in August 2009       revealing severe 3-vessel CAD and global hypokinesis with an EF of       40-45%.      b.     Status post coronary artery bypass grafting with LIMA to  LAD, SVG to PDA, SVG to OM1, and SVG to PL in August 2009 with an       EF per echo with 30%.  3. Diabetes.  4. Hyperlipidemia.  5. Degenerative arthritis.  6. Prostate cancer.      a.     Status post radical prostatectomy.  7. Carotid stenosis.  8. Postoperative ileus.  9. Postoperative pleural effusion.   SOCIAL HISTORY:  He lives in [place] with his wife.  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  He did stop smoking 4 years ago.  He  does not drink alcohol.   FAMILY HISTORY:  Mother deceased with cancer.  Father deceased with MI.  Brother with recent CVA, status post knee replacement and he also has a  brother and 2 sisters additionally with good health.   CURRENT MEDICATIONS AT HOME:  1. Clonidine 0.1 mg b.i.d.  2. Avandamet 4/500 mg b.i.d.  3. Lopressor 50 mg b.i.d.  4. Benazepril 20 mg daily.  5. Darvocet-N 100 two tablets q.6 h. p.r.n.   ALLERGIES:  No known drug allergies.   CURRENT LABORATORY DATA:  Hemoglobin 15.0, hematocrit 44.0, and white  blood cells 139.  Potassium 4.1, chloride 105, CO2 24, BUN 11,  creatinine 0.8, and  glucose 156.  CK 63, MB 2.6, and troponin 0.05.   Chest x-ray revealing left bibasilar atelectasis and effusion increasing  vascular congestion and interstitial prominence, question interstitial  edema.  EKG revealing sinus tachycardia, ventricular rate of 102 beats  per minute with hypertrophy noted.   PHYSICAL EXAMINATION:  VITAL SIGNS:  Blood pressure currently 188/96,  pulse 95, respirations 32, and O2 sat 96% on 2 L.  HEENT:  Head is normocephalic and atraumatic.  Eyes, PERRLA.  Mucous  membranes of mouth are pink and moist.  Tongue is midline.  NECK:  Supple.  There are no carotid bruits.  There is positive JVD to  the mandible.  HEART:  Regular rate and rhythm with 1/6 systolic murmur auscultated.  Pulses are 2+ and equal without bruits.  LUNGS:  Bilateral crackles in the middle to upper lobes with diminished  breath sounds on the left.  ABDOMEN:  Soft, nontender, 2+ bowel sounds.  Ventral hernia is noted.  EXTREMITIES:  Without clubbing, cyanosis, or edema.  NEUROLOGIC:  Cranial nerves II-XII are grossly intact.   IMPRESSION:  1. Pleural effusion.      a.     Status post thoracentesis per Dr. Prescott Gum x2 since August       2009.  2. Coronary artery disease, status post four-vessel coronary artery      bypass grafting secondary to severe three-vessel disease.  3. Hypertension, now well controlled probably secondary to steroid      addition and Avandamet.  4. Diabetes.   PLAN:  The patient has been seen and examined by myself and Dr. Bing Quarry in the ER.  We will admit to telemetry.  I gave him IV diuresis  x1 and consulted Dr. Prescott Gum secondary to recurrent pleural effusions.  We will increase his clonidine to 0.2 mg b.i.d., but watch heart rate  concerning this.  We will consider renal ultrasound to  allow renal artery stenosis in the setting of difficult to control  hypertension with known history of CAD.  The patient will be followed  through our  hospitalization making further recommendation throughout the  hospital course depending on the patient's response to treatment.  Phill Myron. Purcell Nails, NP      Loretha Brasil. Lia Foyer, MD, [facility]  Electronically Signed    KML/MEDQ  D:  08/07/2008  T:  08/08/2008  Job:  [id number]

## 2011-03-03 ENCOUNTER — Encounter: Payer: Self-pay | Admitting: Family Medicine

## 2011-03-03 ENCOUNTER — Other Ambulatory Visit: Payer: Self-pay | Admitting: *Deleted

## 2011-03-03 ENCOUNTER — Ambulatory Visit: Payer: Medicare Other | Admitting: Family Medicine

## 2011-03-03 ENCOUNTER — Other Ambulatory Visit: Payer: Self-pay

## 2011-03-03 ENCOUNTER — Ambulatory Visit (INDEPENDENT_AMBULATORY_CARE_PROVIDER_SITE_OTHER): Payer: Medicare Other | Admitting: Family Medicine

## 2011-03-03 DIAGNOSIS — E119 Type 2 diabetes mellitus without complications: Secondary | ICD-10-CM

## 2011-03-03 DIAGNOSIS — I2510 Atherosclerotic heart disease of native coronary artery without angina pectoris: Secondary | ICD-10-CM

## 2011-03-03 DIAGNOSIS — I10 Essential (primary) hypertension: Secondary | ICD-10-CM

## 2011-03-03 DIAGNOSIS — M1990 Unspecified osteoarthritis, unspecified site: Secondary | ICD-10-CM

## 2011-03-03 DIAGNOSIS — M129 Arthropathy, unspecified: Secondary | ICD-10-CM

## 2011-03-03 DIAGNOSIS — K859 Acute pancreatitis without necrosis or infection, unspecified: Secondary | ICD-10-CM

## 2011-03-03 MED ORDER — METFORMIN HCL 1000 MG PO TABS: 1000.0000 mg | ORAL_TABLET | Freq: Two times a day (BID) | ORAL | Status: DC

## 2011-03-03 MED ORDER — PROCHLORPERAZINE MALEATE 10 MG PO TABS: ORAL_TABLET | ORAL | Status: DC

## 2011-03-03 MED ORDER — TRAMADOL HCL 50 MG PO TABS: ORAL_TABLET | ORAL | Status: DC

## 2011-03-03 MED ORDER — CLOPIDOGREL BISULFATE 75 MG PO TABS: 75.0000 mg | ORAL_TABLET | Freq: Every day | ORAL | Status: DC

## 2011-03-03 MED ORDER — METFORMIN HCL 500 MG PO TABS: ORAL_TABLET | ORAL | Status: DC

## 2011-03-03 NOTE — Patient Instructions (Signed)
Plan I have serum amylase to violate pancreatitis and hemoglobin A1c to evaluate diabetes Will call results tomorrow Refill medications and increase Metformin 1000 mg twicec daily As well as continue Actos 30 mg each day E-mail new prescription for tramadol

## 2011-03-03 NOTE — Progress Notes (Signed)
  Subjective:    Patient ID: [name], male    DOB: Apr 02, 1933, 77 y.o.   MRN: [id number] This 77 year old white married male he is in today for follow out visit pertaining to his hospitalization 51-510 with diagnoses of idiopathic acute pancreatitis uncontrolled hypertension history of coronary artery disease degenerative joint disease history of prostate cancer post radical prostatectomy patient also has hypertension which is controlled with blood pressure 108/62 patient had a cholecystectomy followed by chest  infection with drainage as possible empyema on 51 the patient had epigastric pain was seen at that ER and admitted and treated for pancreatitis as stated above he remained in ICU and p.o. for 5 days since then has had a good appetite and doing her wellDiabetes has been poorly controlled and has been started on Actos 30 mg as well as increasing metformin to 1000 mg b.i.d. to have hemoglobin A1c today and repeat every 3 monthsHPI    Review of Systemssee history of present illness    Objective:   Physical Exam The patient is well well well-nourished man who is very palpable sitting in the chair with company by his wifeHEENT negative heart lungs no acute problems abdomen no epigastric tenderness bowel sounds normal      Assessment & Plan:  My assessment is that the patient is doing very well and will alter his medications somewhat also getting a serum amylase today as well as a hemoglobin A1c to evaluate status of pancreatitis and diabetes Kidney prescription for tramadol since he does have some chest pain episode GERD is under control with Protonix Reviewed medication and plan for followup care

## 2011-03-04 LAB — AMYLASE: Amylase: 76 U/L (ref 27–131)

## 2011-03-05 NOTE — H&P (Signed)
NAME:  [name], [name]                            ACCOUNT NO.:  [id number]   MEDICAL RECORD NO.:  [id number]                   PATIENT TYPE:  INP   LOCATION:  0004                                 FACILITY:  [facility]   PHYSICIAN:  Domingo Pulse, M.D.               DATE OF BIRTH:  1933/02/01   DATE OF ADMISSION:  05/19/2004  DATE OF DISCHARGE:                                HISTORY & PHYSICAL   SERVICE:  Urology.   ADMITTING DIAGNOSIS:  Carcinoma of the prostate.   SECONDARY DIAGNOSES:  1. Diabetes.  2. Hypertension.  3. Esophageal reflux disease.  4. Arthritis.   HISTORY:  This is a 77 year old male who was found to have an elevated PSA  of 5.9 and underwent prostatic ultrasound-guided biopsy and had a Gleason  score 7 involving 60% of the biopsy on the right and there was some  perineural invasion noted; there was also perineural invasions noted on the  left-hand side, where the Gleason score was 7 and was present in 50% of the  cores.  The patient was further staged with a CT scan and bone scan, both of  which were negative.  He underwent extensive counseling as to the  appropriate treatments and elected to undergo radical prostatectomy with the  knowledge that because of the extensive nature of the disease, the high  Gleason score, and the perineural invasion, that he might have elevated PSA  postoperatively and should that occur, he is prepared to undergo additional  radiation therapy.  The patient is to be admitted following the procedure.   PAST MEDICAL HISTORY:  The patient's past medical history is remarkable for:  1. Hypertension.  2. Diabetes.   MEDICATIONS:  The patient's medications at the time of admission were:  1. Lotrel 10/20 mg 1 daily.  2. Xanax on a p.r.n. basis.  3. Avandia 4 mg 2 tablets daily.  4. Metformin 500 mg 2 tablets daily.  5. He has been on Voltaren 75 mg b.i.d. but that has been discontinued     before the procedure.   PAST SURGICAL HISTORY:  1. The patient has had carpal tunnel surgery bilaterally.  2. He also had right retinal surgery back in 2004.   ALLERGIES:  He has no known allergies, although he does have a sensitivity  to CODEINE.   SOCIAL HISTORY:  His social history is unremarkable.  The patient has not  smoked in 35 years and does not use alcohol.   FAMILY HISTORY:  Family history is pertinent for diabetes, heart disease and  hypertension.  His mother died of cancer at age 81.  His father died of  heart disease.  He is married with 2 grown children without health problems.   REVIEW OF SYSTEMS:  His review of systems is pertinent for erectile  dysfunction, shortness of breath, occasional chest pain and is otherwise  negative with no other  ongoing cardiac, pulmonary, musculoskeletal,  neurologic, endocrine, vascular, gastrointestinal, dermatologic, urologic or  psychological disorders.   PHYSICAL EXAMINATION:  GENERAL:  On examination, he is a well-developed,  well-nourished male in no acute distress.  VITAL SIGNS:  Temperature 97.6, pulse 64, respirations 12, blood pressure  160/84.  Weight 212.  HEENT:  Normocephalic, atraumatic.  Cranial nerves II-XII were grossly  intact.  NECK:  Neck was supple with no adenopathy or thyromegaly.  LUNGS:  The lungs were clear.  HEART:  Heart had a regular rate and rhythm with no murmurs, thrills,  gallops, rubs or heaves.  ABDOMEN:  The abdomen is soft and nontender with no palpable masses, rebound  or guarding.  GU:  Testicles are normal in size, shape and consistency with no hydroceles,  spermatocele, varicocele, hernia or adenopathy.  PERIPHERAL VASCULAR:  Peripheral pulses are normal.  NEUROLOGIC:  Neurologic system was intact.  RECTAL:  Examination shows an enlarged prostate but without clear-cut  nodularity or extent of disease outside the prostate.   IMPRESSION:  1. Carcinoma of the prostate.  2. Diabetes.  3. Hypertension.  4. Arthritis.   PLAN:  The plan  is to admit following radical retropubic prostatectomy.                                               Domingo Pulse, M.D.    RJE/MEDQ  D:  05/19/2004  T:  05/19/2004  Job:  [id number]   cc:   Shanna Cisco., M.D.  [address] [place]  [place] [address]  Fax: [phone]

## 2011-03-05 NOTE — Discharge Summary (Signed)
NAME:  [name], [name]                            ACCOUNT NO.:  [id number]   MEDICAL RECORD NO.:  [id number]                   PATIENT TYPE:  INP   LOCATION:  [id number]                                 FACILITY:  [facility]   PHYSICIAN:  Domingo Pulse, M.D.               DATE OF BIRTH:  05-02-1933   DATE OF ADMISSION:  05/19/2004  DATE OF DISCHARGE:  05/22/2004                                 DISCHARGE SUMMARY   SERVICE:  Urology.   DISCHARGE DIAGNOSES:  1. Carcinoma of the prostate.  2. Diabetes.  3. Hypertension.  4. Esophageal reflux.   PRINCIPAL PROCEDURE:  Radical prostatectomy with bilateral lymph node  dissection performed on the day of admission.   This 77 year old male has an elevated PSA of 5.9 and on a prostatic  ultrasound-guided biopsy he was found to have Gleason score 7 involving 60%  of the biopsy on the right with perineural invasion.  He also had Gleason  score 7 on the left involving 50% of the biopsy and that showed perineural  invasion as well.  CT scan and bone scan were done because of the perineural  invasion; these were negative.  He was given extensive preoperative  counseling and elected to undergo radical prostatectomy.   The patient's past medical history remarkable for hypertension, diabetes,  and esophageal reflux disease.  His medications at the time of admission  were Lotrel, Xanax, Avandia, metformin, he had discontinued his Voltaren  prior to the procedure.  The patient's previous surgery included carpal  tunnel surgery as well as retina surgery.  The patient's family history,  social history, and review of systems as well as his initial physical  examination are well delineated in the admission History and Physical.   The patient was taken to the operating room and underwent a successful  radical prostatectomy.  His postoperative course was unremarkable.  He was  rapidly advanced to a regular diet which he tolerated without difficulty.  His  postoperative hematocrit was 33.5 and he had normal electrolytes.  The  patient had a brief increase in his JP drainage but when we checked a  creatinine this turned out not to be urine and may have been simply serous  fluid.  The patient was ready for discharge by postoperative day #3.  He was  eating without difficulty, he had had normal bowel movements.  The only  complaint he had was irritation from the catheter.  He was sent home with  Darvocet for pain, Septra DS one daily, and Pyridium Plus for any burning.  He will return to the office in 1 week for staple removal, and little bit  later on after that he will have his Foley catheter removed.  Domingo Pulse, M.D.    RJE/MEDQ  D:  06/04/2004  T:  06/05/2004  Job:  [id number]

## 2011-03-05 NOTE — Op Note (Signed)
NAME:  [name], [name]                            ACCOUNT NO.:  [id number]   MEDICAL RECORD NO.:  [id number]                   PATIENT TYPE:  INP   LOCATION:  0004                                 FACILITY:  [facility]   PHYSICIAN:  Domingo Pulse, M.D.               DATE OF BIRTH:  February 08, 1933   DATE OF PROCEDURE:  05/19/2004  DATE OF DISCHARGE:                                 OPERATIVE REPORT   SERVICE:  Urology.   PREOPERATIVE DIAGNOSES:  Carcinoma of the prostate.   POSTOPERATIVE DIAGNOSES:  Carcinoma of the prostate.   PROCEDURE:  Bilateral pelvic lymph node dissection and radical retropubic  prostatectomy.   SURGEON:  Domingo Pulse, M.D.   ASSISTANT:  Lynford Citizen, MD   ANESTHESIA:  General.   COMPLICATIONS:  None.   DRAINS:  Jackson-Pratt drain as well as a Foley catheter.   HISTORY:  This 76 year old male had a PSA of 5.9 and underwent a prostatic  ultrasound guided biopsy.  The patient had greater than 50% of the cores  positive on both sides and it was all Gleason score 7.  Because of the high  Gleason score and the extensive number of positive biopsies, the decision  was made to proceed with bone scan and CT scan. A portion of the bone scan  was negative. The CT scan showed no sign of spread into the adjacent  structures and no evidence of adenopathy.  The patient was carefully advised  as to his treatment options. These included watchful waiting, radical  prostatectomy, radical perineal prostatectomy, laparoscopic or robotic  prostatectomy, radiation therapy, seed implantation, chemotherapy,  cryosurgery with hormonal ablation.  The patient will likely do something  with surgical intent. Because of the high Gleason score, he clearly needs to  have a radical retropubic prostatectomy as opposed to a perineal  prostatectomy as lymph node dissection would be mandatory. The patient was  offered the opportunity to consider laparoscopic or robotic options at  tertiary medical  centers, however, the experimental nature of this was  worrisome to the patient.  He elected to undergo a radical retropubic  prostatectomy. He understands the risks and benefits of the procedure. The  patient is noted to be impotent preoperatively and because of the extensive  nature of the disease it was not felt that he was a candidate for formal  nerve sparing. Full and informed consent was obtained.   DESCRIPTION OF PROCEDURE:  After successful induction of general anesthesia,  the patient was placed in the supine position with a slight bump underneath  the pelvis. He was then prepped and draped in the usual sterile fashion. A  lower midline incision was made directly by the pubic bone and up to the  level of the umbilicus. This was carried down through to Scarpa's fascia and  the subcutaneous tissue until the rectus sheath was identified.  The rectus  sheath  was opened in the midline and the rectus abdominis and pyramidalis  were identified. These were opened in the midline all the way down to the  pubic bone.  The space of Retzius was developed, the Bookwalter retractor  was placed. A standard pelvic lymph node dissection was performed on each  side with good exposure of the external iliac artery as well as the  obturator nerve. The endopelvic fascia was opened on both sides. The  puboprostatic ligaments were taken down, the Hoenfeltner clamp was then  placed behind the dorsal vein complex which was tied off with two separate  sutures #1 Vicryl and then after it was transected it was oversewn with a 2-  0 Vicryl suture. Before the dorsal vein complex had been transected, a back  bleeding stitch of chromic was placed. A Hoenfeltner was then passed around  the urethra and an umbilical tape was placed behind the urethra. The urethra  was transected adjacent to the bladder neck with an excellent stub obtained.  The back wall of the urethra was transected after the catheter had been  cut.  The pedicles were then taken down in the usual fashion with surgical clips.  Denonvillier's fascia was incised and the seminal vesicles were identified.  The vas were clipped and divided, the seminal vesicles were freed up and  clipped and the attachments were divided. The bladder neck was then taken  down with preservation of the circular fibers.  Posterior to the bladder  neck, a small opening in the bladder was made, this was identified and  closed separately.  The specimen was then removed and it appeared that  adequate marks had been obtained, careful inspection showed no evidence of  any tumor anywhere outside of the resected area. The bladder neck was  carefully inspected and no glandular material appeared to have been left  behind. The operative field was checked and adequate hemostasis had been  obtained. The rectum had not been injured. The mucosa was then inverted with  4-0 Vicryl sutures, anastomosis made in the usual fashion with Monocryl  sutures using the double needle technique. These were placed at the 12  o'clock, 2 o'clock, 5 o'clock, 7 o'clock and 10 o'clock positions. After the  back wall sutures had been placed, a Foley catheter was passed into the  bladder and all the sutures were then tied down with an appropriate degree  of tension completing the anastomosis.  This was irrigated and was water  tight. The patient had indigo carmine placed at the end of the procedure and  this was seen to come from ureters that were well back from the bladder neck  and it did appear that any ureteral injury had occurred. The sponge, needle  and instrument counts were ascertained to be correct prior to closure. A  drain was placed in the pelvis and suture in place with 2-0 silk.  The  fascia was closed with a running suture of #1 PDS, the skin was closed with  surgical clips.  The patient tolerated the procedure well and was taken to the recovery room in good condition.                                                Domingo Pulse, M.D.    RJE/MEDQ  D:  05/19/2004  T:  05/19/2004  Job:  [id number]   cc:   Shanna Cisco., M.D.  [address] [place]  [place] [address]  Fax: [phone]

## 2011-03-05 NOTE — Op Note (Signed)
NAME:  [name], [name]                  ACCOUNT NO.:  [id number]   MEDICAL RECORD NO.:  [id number]          PATIENT TYPE:  AMB   LOCATION:  SDS                          FACILITY:  [facility]   PHYSICIAN:  Otilio Connors, M.D.  DATE OF BIRTH:  21-Aug-1933   DATE OF PROCEDURE:  11/17/2006  DATE OF DISCHARGE:                               OPERATIVE REPORT   DIAGNOSIS:  Herniated nucleus pulposus, right L4-5.   POSTOPERATIVE DIAGNOSIS:  Herniated nucleus pulposus, right L4-5.   PROCEDURES:  Right L4-5 semihemilaminectomy and diskectomy,  microdissection with microscope.   SURGEON:  Otilio Connors, M.D.   ASSISTANT:  Hosie Spangle, M.D.   ANESTHESIA:  General endotracheal tube.   ESTIMATED BLOOD LOSS:  Minimal.   BLOOD GIVEN:  None.   DRAINS:  None.   COMPLICATIONS:  None.   REASON FOR PROCEDURE:  The patient is a 77 year old gentleman, who has  had a lot of right back and leg pain and numbness, with worse decreased  sensation in the right L4 distribution.  MRI was done, showing  spondylytic change throughout, but a disk herniation to the right side  of L4-5, that extends cephalad.  There is also a left-sided far-lateral  disk protrusion, but not as big, the worst problem actually being his  right-sided problem, which was more symptomatic.  The patient was  brought in for a right 4-5 diskectomy.   PROCEDURES IN DETAIL:  The patient was brought to the operating room and  general anesthesia was induced.  The patient was placed in a prone  position on a Wilson frame with all pressure points padded.  The patient  was prepped and draped in a sterile fashion.  A site of incision was  injected with 10 mL of 1% lidocaine with epinephrine.  A needle was  placed in the interspace.  X-ray was obtained, showing it was pointing  at the 4-5 interspace.  An incision was then made, centered where the  needle was.  And the incision was taken down to the fascia and  hemostasis obtained with  Bovie cauterization.  The fascia was incised  with the Bovie and subperiosteal dissection was done over the L4 and 5  spinous processes and laminae out to the facets.  A self-retaining  retractor was placed.  A marker was placed in the interspace and another  x-ray was obtained, confirming our positioning at L4-5.  The microscope  was brought in for microdissection.  At this point, a high-speed drill  was used to start a semihemilaminectomy and medial facetectomy, and this  was completed with a Kerrison punch.  Ligamentum flavum was removed.  A  foraminotomy was done over the L5 root.  We explored the epidural space  and found the disk space, and just up above there, we still had bone  underneath the lamina.  I could feel a mass up above the disk space, but  I could not bring that down.  We drilled a little bit further into the  lamina for a little bigger laminectomy.  We used Kerrison punches to  finish that off.  And then, using the root retractor to retract the dura  and nerve hooks to reach up under the 4 roots, we saw pieces of disk.  And we were able to use the micropituitary and the nerve hooks to start  removing pieces of free-fragment disk that were up under the 4 roots.  We had multiple small pieces, that when noted together, represented  fairly large amounts of disk material.  When we were finished, all the  free fragments were removed.  We had good decompression of the 4 roots  and they exited the bilateral foramina well.  The 5 root was  decompressed.  I could not find a hole in the annulus and we did not end  in the disk space.  We got hemostasis with Gelfoam and thrombin.  We  irrigated that out.  We irrigated with antibiotic solution.  We again  checked the nerve roots and checked up under the dura.  We found one  small little piece of disk still, but could not find any more.  We again  irrigated with antibiotic solution.  We had good hemostasis and the  retractors were  removed.  The fascia was closed with 0 Vicryl  interrupted suture.  The subcutaneous tissue was closed with 0, 2-0 and  3-0 Vicryl interrupted sutures.  The skin was closed with benzoin and  Steri-Strips.  A dressing was placed.  The patient was placed back in a  supine position, awakened from anesthesia and transferred to the  recovery room in stable condition.           ______________________________  Otilio Connors, M.D.     JRH/MEDQ  D:  11/17/2006  T:  11/18/2006  Job:  [id number]

## 2011-03-05 NOTE — Op Note (Signed)
NAME:  COXGaspar, Cusano                  ACCOUNT NO.:  [id number]   MEDICAL RECORD NO.:  [id number]          PATIENT TYPE:  AMB   LOCATION:  NESC                         FACILITY:  [facility]   PHYSICIAN:  Domingo Pulse, M.D.  DATE OF BIRTH:  06/28/1933   DATE OF PROCEDURE:  01/17/2007  DATE OF DISCHARGE:                               OPERATIVE REPORT   PREOPERATIVE DIAGNOSIS:  Bladder calculi.   POSTOPERATIVE DIAGNOSIS:  Bladder calculi.   PROCEDURE:  Cystolithalopaxy.   SURGEON:  Domingo Pulse, M.D.   ANESTHESIA:  General.   COMPLICATIONS:  None.   DRAINS:  None.   BRIEF HISTORY:  This 77 year old male is status post radical  prostatectomy.  The patient recently developed some hematuria.  He  underwent evaluation and was found to have bladder stones.  He is now to  undergo cystolithalopaxy.  He understands the risks and benefits of the  procedure and gave full informed consent.   PROCEDURE:  After successful induction of general anesthesia, the  patient was placed in the dorsal lithotomy position, prepped with  Betadine and draped in the usual sterile fashion.  Cystoscopy was  performed, urethra was visualized in its entirety and found to be  normal.  The patient does have the penile prosthesis in place and there  was no signs of erosion or other problems with the prosthesis.  The  bladder neck was wide open.  The sphincter mechanism appeared to be  intact and functional.  The patient had no evidence of a local  recurrence.  The bladder itself was unremarkable and no tumors could be  identified.  Both ureters were normal.  The stones were identified and  these were two jack type stones.  These were successfully fragmented and  all pieces extracted.  The holmium laser was utilized for the stone  fragmentation and all the fragments were extracted using a Toomey  syringe as well as a grasper.  The final inspection showed that the  bladder had not been injured in any way.  The  bladder was drained.  The  cystoscope was removed.  The patient tolerated the procedure well and  was taken to recovery in good condition.           ______________________________  Domingo Pulse, M.D.  Electronically Signed     RJE/MEDQ  D:  01/17/2007  T:  01/17/2007  Job:  [id number]

## 2011-03-05 NOTE — Op Note (Signed)
NAME:  [name], [name]                            ACCOUNT NO.:  [id number]   MEDICAL RECORD NO.:  [id number]                   PATIENT TYPE:  AMB   LOCATION:  DAY                                  FACILITY:  [facility]   PHYSICIAN:  Timothy E. Rosana Hoes, M.D.              DATE OF BIRTH:  [birth year]   DATE OF PROCEDURE:  07/11/2003  DATE OF DISCHARGE:                                 OPERATIVE REPORT   PREOPERATIVE DIAGNOSIS:  Anal fissure.   POSTOPERATIVE DIAGNOSIS:  Anal fissure.   PROCEDURE:  Repair of anal fissure and proctoscopy.   SURGEON:  Timothy E. Rosana Hoes, M.D.   ANESTHESIA:  General.   INDICATIONS FOR PROCEDURE:  Mr. Knetter has been seen and followed in the office  for chronic recurrent anal fissures. He had first responded to conservative  management but then failed several times and is now ready to proceed with  surgery as has been carefully explained. His preoperative CBC was normal,  his chemistry profile was satisfactory,  EKG was normal, chest x-ray was  normal. He does have a history of hypertension and adult onset diabetes. The  prep was at home, he was identified and the permit signed, evaluated by  anesthesia.   DESCRIPTION OF PROCEDURE:  The patient was taken to the operating room,  placed supine, LMA anesthesia provided. He was placed in lithotomy, perianal  area inspected, prepped and draped  in the usual fashion. There was a  prominent right posterior anal tag. This was not a sentinel tag to the  fissure. The fissure was directly posterior and as well a fissure  anteriorly. Stenosis was mild. The anus was injected around about with  Marcaine 0.25% with epinephrine and massaged in well. The anus dilated  gently and a left posterior internal sphincterotomy accomplished  percutaneously with a 15 blade. Care was taken to avoid the external  sphincter. The anus dilated. Anoscope introduced, anoscopy unremarkable  except for the anterior and posterior fissure both of which  were cauterized.  I elected not to excise the external tag.   The scope removed, proctoscope introduced and to 25 cm the rectal mucosa was  negative. Scope withdrawn, area inspected, no complications. Gelfoam gauze  and a dry sterile dressing applied. He was awakened and taken to the  recovery room in good condition.   Written and verbal instructions including Percocet #24 were given to him and  his wife and he will be seen and followed as an outpatient.                                               Timothy E. Rosana Hoes, M.D.    TED/MEDQ  D:  07/11/2003  T:  07/11/2003  Job:  [id number]   cc:   Gwyndolyn Saxon  Donalee Citrin., M.D.  [address] [place]  [place] [address]  Fax: [phone]

## 2011-03-05 NOTE — Op Note (Signed)
NAME:  [name], [name]                  ACCOUNT NO.:  [id number]   MEDICAL RECORD NO.:  [id number]          PATIENT TYPE:  AMB   LOCATION:  DAY                          [facility]ACILITY:  [facility]   PHYSICIAN:  Lynford Citizen, MD         DATE OF BIRTH:  1932-11-26   DATE OF PROCEDURE:  11/10/2004  DATE OF DISCHARGE:                                 OPERATIVE REPORT   PREOPERATIVE DIAGNOSIS:  Organic impotence.   POSTOPERATIVE DIAGNOSIS:  Organic impotence.   PROCEDURE PERFORMED:  Insertion of a three-piece inflatable penile  prosthesis (Mentor I Alpha).   ATTENDING SURGEON:  Domingo Pulse, M.D.   RESIDENT SURGEON:  Lynford Citizen, M.D.   ANESTHESIA:  General endotracheal.   ESTIMATED BLOOD LOSS:  50 cc.   DRAINS:  A 16-French Foley catheter to straight drainage.   COMPLICATIONS:  None.   INDICATION FOR PROCEDURE:  Mr. Farnam is a pleasant 77 year old male initially  evaluated for erectile dysfunction secondary to organic impotence.  In the  past, he has tried oral agents and has not been interested in injection  therapy.  His past medical history is significant for diabetes mellitus as  well as hypertension.  At this time, he wishes to undergo insertion of a  three-piece inflatable penile prosthesis.  He understands all the risks,  benefits, and alternatives of the procedure and is willing to proceed.   PROCEDURE IN DETAIL:  Following identification by his arm bracelet, the  patient was brought to the operating room and placed in the supine position.  Here he underwent successful induction of general endotracheal anesthesia  and received preoperative IV antibiotics.  His genitalia were then shaved  and a 10-minute iodine scrub was performed.  The lower abdomen, phallus, and  genitalia were then prepped with Betadine and draped in the usual sterile  fashion.  We placed a 16-French Foley catheter to straight drainage and  drained the bladder.  We elected to proceed with an infrapubic penile  prosthetic placement.  We created a semilunar incision at the dorsal base of  the penis.  The incision was carried through the Scarpa's fascia as well as  subcutaneous tissue using Bovie electrocautery.  Strully scissors were then  used to further dissect just on top of the corporal bodies.  The urethra was  palpated ventrally with the Foley catheter in place.  Holding sutures were  then placed bilaterally on the corpora.  These consisted of 2-0 Vicryl.  We  then created our corporotomies on both the right and the left.  Strully  scissors were used to further develop the corporal spaces.  Brooks dilators  were used to dilate the corpora bilaterally, both proximally and distally,  to a 14 dilator.  There was some brisk bleeding from the corpora.  We then  pre-placed sutures on both corporotomies to facilitate closure.  These  sutures were interrupted 2-0 Vicryl.  At this time, both corporal bodies as  well as the incision were copiously irrigated with antibiotic solution.  We  then used Bovie electrocautery to dissect down onto  the fascia just above  the pubic symphysis.  The fascia was then incised in the midline and the  surgeon's finger was used to bluntly dissect.  Strully scissors were used to  make a small puncture through the transversalis fascia.  This allowed the  surgeon's finger to develop a space in the retropubic space just to the  right lateral aspect of the bladder.  We then placed the Mentor tightened  Bioflex reservoir.  There was a nice fit.  This was a 60 cc reservoir.  We  initially filled it with 75 cc of sterile water to further develop our  reservoir space.  Fifteen cc's were removed prior to connection to the  cylinders.  There was a very small opening in the fascia which was not  closed.  This was sutured.  Sterile towels were then placed on the operative  field.  The cylinders and pump were then prepared in a standard fashion.  All air was removed from the tubing.   The protective tubing over the distal  cylinder sutures was then removed.  The distal traction sutures were then  placed in the needle which was then placed in the [facility] tool.  The Johny Chess  tool was then placed into the distal corpora.  The needle was then pushed  through the lateral aspect of the glans well away from the urethra.  This  procedure was repeated on the second side.  Of note, initial measurements  showed approximately 20 cm on the right and left.  Therefore, an 18-cm  device with 2-cm rear tips was employed for use.  The 2-cm rear tips were  then affixed in the proximal corporotomies.  The device was then seated and  found to be an excellent fit.  The device was cycled and molded slightly.  There were no kinks in the tubing or protrusion of the cylinder from the  corporotomy sites.  The device was then deflated.  The pre-placed closing  sutures in the corporotomies were then used to create a watertight closure  around the cylinders.  The wound was then again copiously irrigated.  A sub-  dartos pouch was then created in the most dependent portion of the right  hemiscrotum.  The pump was then placed within this area well away from the  right testicle.  A Babcock clamp was used to hold the cylinder into place  during closure.  The tubing was then appropriately shortened and all  connections were made.  The device was again cycled and the end result was  an erection adequate for intercourse that was straight.  The incision was  once again copiously irrigated.  Hemostasis was excellent.  Any remaining  bleeding was fulgurated using Bovie electrocautery.  A 2-0 Vicryl was used  to close the dartos layer in a running fashion.  The skin closure was  facilitated by subcuticular 5-0 Monocryl.  Dermabond was used to complete  the incision.  A Foley catheter was then connected to straight drainage.  The patient tolerated the procedure well and there were no complications. Please note  that Dr. Alona Bene was present and participated in the entire  procedure, as he was the responsible surgeon.   DISPOSITION:  After awaking from general anesthesia, the patient was  transferred to the postanesthesia care unit in stable condition.  From here,  he would be discharged to home after removal of his Foley catheter and  successful voiding trial.      EG/MEDQ  D:  11/10/2004  T:  11/10/2004  Job:  [id number]

## 2011-05-14 ENCOUNTER — Other Ambulatory Visit: Payer: Self-pay | Admitting: *Deleted

## 2011-05-14 MED ORDER — SPIRONOLACTONE 25 MG PO TABS: 25.0000 mg | ORAL_TABLET | Freq: Every day | ORAL | Status: DC

## 2011-05-17 NOTE — Consult Note (Signed)
NAME:  [name], [name]                  ACCOUNT NO.:  [id number]  MEDICAL RECORD NO.:  [id number]           PATIENT TYPE:  I  LOCATION:  2107                         FACILITY:  [facility]  PHYSICIAN:  Ludwig Lean. Doreatha Lew, M.D.DATE OF BIRTH:  22-Apr-1933  DATE OF CONSULTATION:  02/17/2011 DATE OF DISCHARGE:                                CONSULTATION   PRIMARY CARDIOLOGIST:  Dr. [name].  PRIMARY CARE PROVIDER:  Frankey Shown, MD  HISTORY OF PRESENT ILLNESS:  This is a 77 year old male with history of coronary artery disease, status post coronary artery bypass grafting x4 in 2009 with subsequent drug-eluting stent to the SVG to OM in December 2009 and again in stent restenosis in December 2010 with the drug- eluting stent.  He is currently admitted for acute pancreatitis, Cardiology was consulted for recommendations on Plavix use while patient is n.p.o.  PAST MEDICAL HISTORY: 1. Coronary artery disease, status post coronary artery bypass     grafting x4 in September 2009, LAD to LIMA, SVG to PDA,  SVG to OM,     SVG to posterior lateral branch.     a.     Status post drug-eluting stent, SVG to OM in December 2009.     b.     Status post drug-eluting stent for NSTEMI stenosis as SVG to      OM drug-eluting stent in October 2010.     c.     Status post nuclear stress test on January 19, 2011, without      his low risk.     d.     Preserved left ventricular ejection fraction  per      echocardiogram in March 2012, ejection fraction of 55%. 2. Hypertension. 3. Hyperlipidemia. 4. Diabetes mellitus. 5. History of prostate cancer. 6. Mild aortic stenosis. 7. Recurrent pleural effusion. 8. Chronic left-bundle branch block. 9. Degenerative joint disease. 10.Lower esophageal stricture, status post dilation. 11.Status post back surgery. 12.Status post bilateral carpal tunnel repair. 13.Status post inguinal hernia repair. 14.Status post appendectomy. 15.Status post  cholecystectomy.  HISTORY OF PRESENT ILLNESS:  This is a 77 year old Caucasian gentleman with the above stated problem list.  He has been admitted for acute pancreatitis.  The patient was recently evaluated by Dr. Percival Spanish for chest pain and dyspnea.  He underwent chest perfusion study on January 19, 2011, which demonstrated low risk.  At this time, the patient states his chest pain has been improving.  He says, he was in his usual state of health until approximately 11 a.m. yesterday when he had acute onset of lower quadrant abdominal pain.  The patient tried to relax further afternoon hoping the pain would resolve.  The pain remain constant was actually moving into his rib cage, the pain was intense and therefore he presented to the emergency department for further evaluation.  In the emergency department, the patient underwent a CT of abdomen and pelvis that showed acute pancreatitis with inflammatory changes starting at the head of the pancreas and including the duodenum.  The patient was found to have acute pancreatitis, his lipase was 74.  The patient  has been admitted by the hospital service, has been placed n.p.o. as well as awaiting an ERCP.  Of note, the patient was transferred to the MICU secondary to hypertension with systolic pressures running up to 205.  He has since been placed on nicardipine drip and systolic pressures are better controlled in the 170s.  The patient currently denies any chest pain or shortness of breath.  He is mildly nauseous as well as uncontrolled for his abdominal pain.  He denies any vomiting or diarrhea.  He also denies any recent fevers or chills.  Cardiology has been consulted for recommendation of Plavix who saw the patient as completely n.p.o.  They are questioning changing to an IV substitute versus holding in a patient with coronary artery disease and history of end-stage restenosis.  The patient is currently hemodynamically stable.  SOCIAL  HISTORY:  The patient lives in [place] with his wife.  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  He denies any tobacco, alcohol or illicit drug use.  FAMILY HISTORY:  Noncontributory for early coronary artery disease.  His mother passed away in her 36s from cancer.  Father passed away in 24s for myocardial infarction.  He has a brother who has a stroke, but no known coronary artery disease.  He has several sisters without known medical problems.  ALLERGIES: 1. Codeine, 2. Morphine. 3. Shellfish.  Inpatient medications: 1. Nicardipine 5 mg per hour. 2. Enalapril 1.25 mg IV q.6 hours. 3. Protonix 40 mg IV daily. 4. Sodium chloride 0.9% 125 mL per hour. 5. Nitroglycerin past 1 inch applied possibly q.6 hours. 6. P.r.n. hydralazine 10 mg IV.  REVIEW OF SYSTEMS:  All pertinent positives and negatives as stated in HPI.  All other systems have been reviewed and are negative.  PHYSICAL EXAMINATION:  VITAL SIGNS:  Temperature  97.8, pulse 60-73, respirations 11 and 21, blood pressure 160-205/59-67, O2 saturation 93% on room air. GENERAL:  This is a well-developed, well-nourished elderly gentleman. He is mildly uncomfortable, but in no acute distress. HEENT:  Normal with an NG tube in place. NECK:  Supple without bruit or JVD. HEART:  Regular rate and rhythm with S1 and S2.  There is a soft systolic murmur appreciated.  Pulses are 2+ and equal bilaterally. LUNGS:  Clear to auscultation anteriorly without wheezes, rales, or rhonchi. ABDOMEN:  Soft, diffusely tender and positive bowel sounds. EXTREMITIES:  No clubbing, cyanosis, or edema. MUSCULOSKELETAL:  No joint deformities or effusions. NEURO:  Alert and oriented x3, cranial nerves II through XII grossly intact.  DIAGNOSTIC: 1. CT of abdomen and pelvis on Feb 17, 2011:  Acute pancreatitis with     inflammatory changes surrounding the head of the pancreas and      studying the duodenum.  Reaction information and thickening of the     duodenum without perforation.  There may be relative outlet     obstruction for duodenum inflammation due to a very distended     stomach and proximal duodenum containing fluid. 2. X-rays of abdomen on Feb 17, 2011:  Nasogastric tube sits in the     stomach.  Tube could be advanced up to 8 cm for better positioning     within the fundus.  EKG showing sinus bradycardia elevated 47 feet     per minute.  There is a known left bundle-branch block.  No acute     changes.  LABORATORY DATA:  WBC is 16.6, hemoglobin  14.2, hematocrit 41.3, platelets 126.  Sodium 135, potassium 3.9, chloride 101, bicarb 24, BUN 20, creatinine 1.11.  Cardiac enzymes negative x2, lipase 754.  ASSESSMENT/PLAN:  This is a 77 year old Caucasian gentleman with history of coronary artery disease, status post PCI to SVT in October 2010 with a drug-eluting stent.  The patient had restenosis in the same location, but he did not have any acute stent thrombosis.  The patient has been admitted for acute pancreatitis and currently completely n.p.o.  it is okay to stop Plavix during this acute illness given duration of time with stent placement.  We would recommend restarting after taking p.o. medication.Thank you for this consultation.     Cecille Amsterdam, PA-C   ______________________________ Ludwig Lean Doreatha Lew, M.D.    NB/MEDQ  D:  02/17/2011  T:  02/18/2011  Job:  [id number]  Electronically Signed by Pennie Rushing P.A. on 03/16/2011 09:50:39 AM Electronically Signed by Romeo Apple M.D. on 05/17/2011 11:14:49 AM

## 2011-05-24 ENCOUNTER — Other Ambulatory Visit: Payer: Self-pay | Admitting: *Deleted

## 2011-05-24 MED ORDER — CARVEDILOL 6.25 MG PO TABS: 6.2500 mg | ORAL_TABLET | Freq: Two times a day (BID) | ORAL | Status: DC

## 2011-06-14 ENCOUNTER — Other Ambulatory Visit: Payer: Self-pay

## 2011-06-14 MED ORDER — ALPRAZOLAM 1 MG PO TABS: 1.0000 mg | ORAL_TABLET | Freq: Three times a day (TID) | ORAL | Status: DC | PRN

## 2011-06-14 NOTE — Telephone Encounter (Signed)
Ok per Dr. Joni Fears to fill pt's alprazolam 1 mg 90 x 5 rf.

## 2011-06-29 ENCOUNTER — Other Ambulatory Visit: Payer: Self-pay | Admitting: Cardiology

## 2011-06-29 MED ORDER — BENAZEPRIL HCL 40 MG PO TABS: ORAL_TABLET | ORAL | Status: DC

## 2011-07-11 ENCOUNTER — Other Ambulatory Visit: Payer: Self-pay | Admitting: Cardiology

## 2011-07-16 LAB — POCT I-STAT 4, (NA,K, GLUC, HGB,HCT)
Glucose, Bld: 169 — ABNORMAL HIGH
Glucose, Bld: 172 — ABNORMAL HIGH
HCT: 26 — ABNORMAL LOW
HCT: 30 — ABNORMAL LOW
HCT: 39
Hemoglobin: 10.2 — ABNORMAL LOW
Hemoglobin: 12.2 — ABNORMAL LOW
Hemoglobin: 13.3
Hemoglobin: 14.3
Hemoglobin: 8.8 — ABNORMAL LOW
Potassium: 4
Potassium: 4.5
Potassium: 4.6
Potassium: 4.6
Potassium: 5.4 — ABNORMAL HIGH
Sodium: 134 — ABNORMAL LOW
Sodium: 136
Sodium: 137
Sodium: 140
Sodium: 140

## 2011-07-16 LAB — CBC
HCT: 25.9 — ABNORMAL LOW
HCT: 26.1 — ABNORMAL LOW
HCT: 27.6 — ABNORMAL LOW
HCT: 27.9 — ABNORMAL LOW
HCT: 28.8 — ABNORMAL LOW
HCT: 31.6 — ABNORMAL LOW
HCT: 45.1
HCT: 46.3
HCT: 47.3
Hemoglobin: 10 — ABNORMAL LOW
Hemoglobin: 10.8 — ABNORMAL LOW
Hemoglobin: 15.6
Hemoglobin: 8.9 — ABNORMAL LOW
Hemoglobin: 9 — ABNORMAL LOW
Hemoglobin: 9.4 — ABNORMAL LOW
Hemoglobin: 9.5 — ABNORMAL LOW
MCHC: 33.7
MCHC: 33.9
MCHC: 34.2
MCHC: 34.2
MCHC: 34.3
MCHC: 34.5
MCHC: 34.5
MCHC: 34.6
MCV: 90.1
MCV: 90.7
MCV: 91
MCV: 91.6
MCV: 91.6
MCV: 91.8
MCV: 91.8
MCV: 92.2
MCV: 92.4
Platelets: 150
Platelets: 156
Platelets: 173
Platelets: 185
Platelets: 188
Platelets: 193
Platelets: 194
Platelets: 210
RBC: 2.85 — ABNORMAL LOW
RBC: 2.88 — ABNORMAL LOW
RBC: 2.99 — ABNORMAL LOW
RBC: 3.04 — ABNORMAL LOW
RBC: 3.2 — ABNORMAL LOW
RBC: 3.45 — ABNORMAL LOW
RBC: 4.89
RBC: 5.04
RBC: 5.17
RDW: 13.2
RDW: 13.2
RDW: 13.4
RDW: 13.5
RDW: 13.6
RDW: 13.7
RDW: 13.7
RDW: 14
WBC: 11.1 — ABNORMAL HIGH
WBC: 11.2 — ABNORMAL HIGH
WBC: 11.8 — ABNORMAL HIGH
WBC: 12.1 — ABNORMAL HIGH
WBC: 12.8 — ABNORMAL HIGH
WBC: 13 — ABNORMAL HIGH
WBC: 7.5
WBC: 7.8
WBC: 9.4

## 2011-07-16 LAB — PREPARE PLATELET PHERESIS

## 2011-07-16 LAB — GLUCOSE, CAPILLARY
Glucose-Capillary: 100 — ABNORMAL HIGH
Glucose-Capillary: 105 — ABNORMAL HIGH
Glucose-Capillary: 109 — ABNORMAL HIGH
Glucose-Capillary: 111 — ABNORMAL HIGH
Glucose-Capillary: 116 — ABNORMAL HIGH
Glucose-Capillary: 122 — ABNORMAL HIGH
Glucose-Capillary: 164 — ABNORMAL HIGH
Glucose-Capillary: 166 — ABNORMAL HIGH
Glucose-Capillary: 186 — ABNORMAL HIGH
Glucose-Capillary: 198 — ABNORMAL HIGH
Glucose-Capillary: 85

## 2011-07-16 LAB — BASIC METABOLIC PANEL
BUN: 11
BUN: 13
BUN: 16
BUN: 16
BUN: 17
CO2: 24
CO2: 24
CO2: 24
CO2: 27
Calcium: 7.5 — ABNORMAL LOW
Calcium: 8.4
Calcium: 8.8
Calcium: 9.6
Chloride: 105
Chloride: 107
Chloride: 108
Chloride: 109
Creatinine, Ser: 0.78
Creatinine, Ser: 0.85
Creatinine, Ser: 0.89
Creatinine, Ser: 0.9
Creatinine, Ser: 0.91
GFR calc Af Amer: >60
GFR calc Af Amer: >60
GFR calc Af Amer: >60
GFR calc Af Amer: >60
GFR calc non Af Amer: >60
GFR calc non Af Amer: >60
GFR calc non Af Amer: >60
GFR calc non Af Amer: >60
Glucose, Bld: 109 — ABNORMAL HIGH
Glucose, Bld: 114 — ABNORMAL HIGH
Glucose, Bld: 147 — ABNORMAL HIGH
Glucose, Bld: 168 — ABNORMAL HIGH
Potassium: 3.8
Potassium: 3.9
Potassium: 4.1
Potassium: 4.2
Sodium: 137
Sodium: 137
Sodium: 140

## 2011-07-16 LAB — BLOOD GAS, ARTERIAL
Acid-base deficit: 1.4
Bicarbonate: 22.9
FIO2: 0.21
O2 Saturation: 96.4
Patient temperature: 97.6
TCO2: 24.1
pCO2 arterial: 38.2
pH, Arterial: 7.393
pO2, Arterial: 80.6

## 2011-07-16 LAB — POCT I-STAT 3, ART BLOOD GAS (G3+)
Acid-base deficit: 1
Acid-base deficit: 2
Acid-base deficit: 2
Acid-base deficit: 2
Acid-base deficit: 3 — ABNORMAL HIGH
Acid-base deficit: 3 — ABNORMAL HIGH
Bicarbonate: 22.5
Bicarbonate: 23.2
O2 Saturation: 100
O2 Saturation: 91
O2 Saturation: 92
O2 Saturation: 96
Patient temperature: 36.8
Patient temperature: 36.8
Patient temperature: 37.8
Patient temperature: 37.8
TCO2: 24
TCO2: 24
TCO2: 25
pCO2 arterial: 48.4 — ABNORMAL HIGH
pH, Arterial: 7.31 — ABNORMAL LOW
pH, Arterial: 7.364
pO2, Arterial: 262 — ABNORMAL HIGH
pO2, Arterial: 67 — ABNORMAL LOW

## 2011-07-16 LAB — POCT I-STAT, CHEM 8
Creatinine, Ser: 1.1
HCT: 26 — ABNORMAL LOW
HCT: 28 — ABNORMAL LOW
Hemoglobin: 8.8 — ABNORMAL LOW
Hemoglobin: 9.5 — ABNORMAL LOW
Potassium: 4
Potassium: 4.6
Sodium: 138
Sodium: 139
TCO2: 22

## 2011-07-16 LAB — CROSSMATCH
ABO/RH(D): O POS
Antibody Screen: NEGATIVE

## 2011-07-16 LAB — CREATININE, SERUM
Creatinine, Ser: 0.81
Creatinine, Ser: 1.03
GFR calc Af Amer: >60
GFR calc Af Amer: >60
GFR calc non Af Amer: >60
GFR calc non Af Amer: >60

## 2011-07-16 LAB — MAGNESIUM
Magnesium: 2.3
Magnesium: 2.3
Magnesium: 2.3

## 2011-07-16 LAB — COMPREHENSIVE METABOLIC PANEL
CO2: 23
Calcium: 9.8
Chloride: 110
Creatinine, Ser: 0.74
GFR calc non Af Amer: >60
Glucose, Bld: 152 — ABNORMAL HIGH
Total Bilirubin: 2 — ABNORMAL HIGH

## 2011-07-16 LAB — PROTIME-INR
INR: 1.4
Prothrombin Time: 18.2 — ABNORMAL HIGH

## 2011-07-16 LAB — APTT
aPTT: 31
aPTT: 34

## 2011-07-16 LAB — POCT I-STAT GLUCOSE: Glucose, Bld: 137 — ABNORMAL HIGH

## 2011-07-16 LAB — HEMOGLOBIN A1C: Hgb A1c MFr Bld: 6.8 — ABNORMAL HIGH

## 2011-07-16 LAB — ABO/RH: ABO/RH(D): O POS

## 2011-07-19 LAB — POCT I-STAT, CHEM 8
BUN: 11
Calcium, Ion: 1.14
Chloride: 105
Creatinine, Ser: 0.8
Glucose, Bld: 156 — ABNORMAL HIGH
HCT: 44
Hemoglobin: 15
Potassium: 4.1
Sodium: 139
TCO2: 24

## 2011-07-19 LAB — CBC
HCT: 37.5 — ABNORMAL LOW
Hemoglobin: 12.5 — ABNORMAL LOW
MCHC: 33.3
MCV: 84
MCV: 84.4
Platelets: 164
Platelets: 177
RBC: 4.47
RDW: 15.3
RDW: 15.4
WBC: 7.6
WBC: 9.9

## 2011-07-19 LAB — BASIC METABOLIC PANEL WITH GFR
BUN: 11
CO2: 28
Calcium: 9.1
Chloride: 104
Creatinine, Ser: 0.89
GFR calc Af Amer: >60
GFR calc non Af Amer: >60
Glucose, Bld: 132 — ABNORMAL HIGH
Potassium: 3.6
Sodium: 139

## 2011-07-19 LAB — URINE MICROSCOPIC-ADD ON

## 2011-07-19 LAB — DIFFERENTIAL
Basophils Absolute: 0
Eosinophils Absolute: 0.1
Eosinophils Relative: 1
Lymphs Abs: 1.7
Neutrophils Relative %: 74

## 2011-07-19 LAB — LIPID PANEL
Cholesterol: 101
HDL: 28 — ABNORMAL LOW
LDL Cholesterol: 58
Total CHOL/HDL Ratio: 3.6
Triglycerides: 74
VLDL: 15

## 2011-07-19 LAB — URINALYSIS, ROUTINE W REFLEX MICROSCOPIC
Bilirubin Urine: NEGATIVE
Glucose, UA: 250 — AB
Ketones, ur: NEGATIVE
Nitrite: NEGATIVE
Specific Gravity, Urine: 1.017
pH: 6

## 2011-07-19 LAB — GLUCOSE, CAPILLARY
Glucose-Capillary: 124 — ABNORMAL HIGH
Glucose-Capillary: 205 — ABNORMAL HIGH
Glucose-Capillary: 96
Glucose-Capillary: 118 — ABNORMAL HIGH
Glucose-Capillary: 119 — ABNORMAL HIGH
Glucose-Capillary: 126 — ABNORMAL HIGH
Glucose-Capillary: 133 — ABNORMAL HIGH
Glucose-Capillary: 135 — ABNORMAL HIGH
Glucose-Capillary: 138 — ABNORMAL HIGH
Glucose-Capillary: 173 — ABNORMAL HIGH

## 2011-07-19 LAB — POCT CARDIAC MARKERS
CKMB, poc: 2.6
Myoglobin, poc: 63.1

## 2011-07-19 LAB — BASIC METABOLIC PANEL
BUN: 8
CO2: 25
Chloride: 104
Potassium: 3 — ABNORMAL LOW

## 2011-07-19 LAB — APTT: aPTT: 42 — ABNORMAL HIGH

## 2011-07-19 LAB — PROTIME-INR
INR: 1.1
Prothrombin Time: 14.4

## 2011-07-19 LAB — HEMOGLOBIN A1C: Mean Plasma Glucose: 148

## 2011-07-19 LAB — B-NATRIURETIC PEPTIDE (CONVERTED LAB)
Pro B Natriuretic peptide (BNP): 1160 — ABNORMAL HIGH
Pro B Natriuretic peptide (BNP): 993 — ABNORMAL HIGH

## 2011-07-23 ENCOUNTER — Other Ambulatory Visit: Payer: Self-pay | Admitting: Cardiology

## 2011-07-23 LAB — POCT I-STAT GLUCOSE
Glucose, Bld: 130 mg/dL — ABNORMAL HIGH (ref 70–99)
Operator id: 141321

## 2011-08-23 ENCOUNTER — Telehealth: Payer: Self-pay | Admitting: Family Medicine

## 2011-09-06 ENCOUNTER — Other Ambulatory Visit: Payer: Self-pay

## 2011-09-06 MED ORDER — FUROSEMIDE 40 MG PO TABS: 40.0000 mg | ORAL_TABLET | Freq: Every day | ORAL | Status: DC

## 2011-09-14 NOTE — Telephone Encounter (Signed)
Chart opened in error

## 2011-11-02 ENCOUNTER — Other Ambulatory Visit: Payer: Self-pay | Admitting: Cardiology

## 2011-11-03 ENCOUNTER — Encounter: Payer: Self-pay | Admitting: Family Medicine

## 2011-11-03 ENCOUNTER — Other Ambulatory Visit: Payer: Self-pay

## 2011-11-03 MED ORDER — NITROGLYCERIN 0.4 MG SL SUBL: 0.4000 mg | SUBLINGUAL_TABLET | SUBLINGUAL | Status: DC | PRN

## 2011-11-04 ENCOUNTER — Encounter: Payer: Self-pay | Admitting: Family Medicine

## 2011-11-04 ENCOUNTER — Ambulatory Visit (INDEPENDENT_AMBULATORY_CARE_PROVIDER_SITE_OTHER): Payer: Medicare Other | Admitting: Family Medicine

## 2011-11-04 VITALS — BP 205/70 | HR 80 | Temp 97.4°F | Resp 12 | Ht 68.0 in | Wt 199.0 lb

## 2011-11-04 DIAGNOSIS — I2510 Atherosclerotic heart disease of native coronary artery without angina pectoris: Secondary | ICD-10-CM

## 2011-11-04 DIAGNOSIS — G4700 Insomnia, unspecified: Secondary | ICD-10-CM

## 2011-11-04 DIAGNOSIS — I10 Essential (primary) hypertension: Secondary | ICD-10-CM

## 2011-11-04 DIAGNOSIS — C61 Malignant neoplasm of prostate: Secondary | ICD-10-CM

## 2011-11-04 DIAGNOSIS — E785 Hyperlipidemia, unspecified: Secondary | ICD-10-CM

## 2011-11-04 DIAGNOSIS — E119 Type 2 diabetes mellitus without complications: Secondary | ICD-10-CM

## 2011-11-04 DIAGNOSIS — I6529 Occlusion and stenosis of unspecified carotid artery: Secondary | ICD-10-CM

## 2011-11-04 MED ORDER — AMLODIPINE BESYLATE 5 MG PO TABS: 5.0000 mg | ORAL_TABLET | Freq: Every day | ORAL | Status: DC

## 2011-11-04 MED ORDER — METRONIDAZOLE 0.75 % EX LOTN: 1.0000 "application " | TOPICAL_LOTION | Freq: Every day | CUTANEOUS | Status: DC

## 2011-11-04 MED ORDER — TRAMADOL HCL 50 MG PO TABS: 50.0000 mg | ORAL_TABLET | Freq: Four times a day (QID) | ORAL | Status: DC | PRN

## 2011-11-04 NOTE — Progress Notes (Signed)
Subjective:    Patient ID: [name], male    DOB: March 25, 1933, 78 y.o.   MRN: [id number]  HPI  Patient is seen for transfer of care. Previous patient of Dr. Joni Fears for about 40 years. Complicated past medical history. He has history of remote prostate cancer, type 2 diabetes, hyperlipidemia, hypertension which apparently been resistant to treatment for years, CAD, ischemic cardiomyopathy, carotid artery stenosis, rosacea, osteoarthritis, and chronic insomnia. Prior history of bypass graft. He has some chronic dyspnea unchanged. No recent chest pain.  Continues to have significant osteoarthritis pains, especially knees. Previously on diclofenac but has been advised to stop this. Uses tramadol occasionally. No recent falls. Does feel unsteady on feet occasionally.  Taking multi- drug regimen for hypertension. Compliant with medications. Does not monitor blood pressures regularly at home. Assessment generalized malaise recently no headaches. No peripheral edema.  Fasting blood sugars run 140. No symptoms of hyperglycemia. No recent A1c.  Past Medical History  Diagnosis Date  . Prostate ca   . CHF (congestive heart failure)   . Diabetes mellitus   . Hypertension   . S/P CABG (coronary artery bypass graft)   . S/P laparoscopic cholecystectomy   . Hernia     hernia repair x 3  . Other and unspecified diseases of appendix   . Pancreatitis    Past Surgical History  Procedure Date  . Appendectomy   . Hernia repair   . Carpal tunnel release 2004    both hands  . Laparoscopic cholecystectomy 09/2009  . Incision and drainage of right chest abscess   . Coronary artery bypass graft     reports that he has quit smoking. He has never used smokeless tobacco. He reports that he does not drink alcohol or use illicit drugs. family history is not on file. Allergies  Allergen Reactions  . Codeine   . Morphine     REACTION: does not like      Review of Systems  Constitutional: Positive  for fatigue. Negative for appetite change and unexpected weight change.  HENT: Negative for congestion and trouble swallowing.   Eyes: Negative for visual disturbance.  Respiratory: Positive for shortness of breath. Negative for cough, chest tightness and wheezing.   Cardiovascular: Negative for chest pain, palpitations and leg swelling.  Gastrointestinal: Negative for nausea, abdominal pain, diarrhea, constipation and blood in stool.  Genitourinary: Negative for dysuria and decreased urine volume.  Musculoskeletal: Positive for arthralgias.  Skin: Negative for rash.  Neurological: Negative for dizziness, syncope, weakness, light-headedness and headaches.  Psychiatric/Behavioral: Negative for dysphoric mood.       Objective:   Physical Exam  Constitutional: He is oriented to person, place, and time. He appears well-developed and well-nourished. No distress.  HENT:  Right Ear: External ear normal.  Left Ear: External ear normal.  Mouth/Throat: Oropharynx is clear and moist.  Neck: Neck supple. No thyromegaly present.  Cardiovascular: Normal rate and regular rhythm.  Exam reveals no gallop.   Pulmonary/Chest: Effort normal and breath sounds normal. No respiratory distress. He has no wheezes. He has no rales.  Abdominal: Soft. He exhibits no distension and no mass. There is no tenderness. There is no rebound and no guarding.  Musculoskeletal: He exhibits no edema.  Lymphadenopathy:    He has no cervical adenopathy.  Neurological: He is alert and oriented to person, place, and time. No cranial nerve deficit.  Skin: No rash noted.  Psychiatric: He has a normal mood and affect. His behavior is normal. Judgment  and thought content normal.          Assessment & Plan:  #1 type 2 diabetes. Reassess A1c. Continue with regular eye exams #2 history of CAD with ischemic cardiomyopathy history. He is encouraged to continue regular cardiology followup #3 history of hyperlipidemia. Recheck  lipid and hepatic panel #4 poorly controlled hypertension with isolated systolic hypertension today with a reading of 99991111 systolic blood pressure. Add amlodipine 5 mg daily. Reassess blood pressure 2 weeks. If no significant improvement at that point consider further evaluation of secondary causes #5 history of osteoarthritis. Avoidance of nonsteroidals. Refill tramadol for as needed use #6 remote history of prostate cancer. No recent dysuria #7 generalized malaise. Possibly related to #4.  Check electrolytes to r/o hyponatremia, hypokalemia, etc.

## 2011-11-05 LAB — BASIC METABOLIC PANEL
Calcium: 9.5 mg/dL (ref 8.4–10.5)
Chloride: 103 mEq/L (ref 96–112)
Creatinine, Ser: 1.4 mg/dL (ref 0.4–1.5)
Sodium: 136 mEq/L (ref 135–145)

## 2011-11-05 LAB — HEPATIC FUNCTION PANEL
AST: 14 U/L (ref 0–37)
Albumin: 4.5 g/dL (ref 3.5–5.2)
Alkaline Phosphatase: 87 U/L (ref 39–117)
Total Protein: 7.9 g/dL (ref 6.0–8.3)

## 2011-11-05 LAB — LIPID PANEL
Cholesterol: 117 mg/dL (ref 0–200)
Triglycerides: 184 mg/dL — ABNORMAL HIGH (ref 0.0–149.0)

## 2011-11-06 NOTE — Progress Notes (Signed)
Quick Note:  Pt wife informed ______

## 2011-11-15 ENCOUNTER — Other Ambulatory Visit: Payer: Self-pay | Admitting: Cardiology

## 2011-11-18 ENCOUNTER — Encounter: Payer: Self-pay | Admitting: Family Medicine

## 2011-11-18 ENCOUNTER — Ambulatory Visit (INDEPENDENT_AMBULATORY_CARE_PROVIDER_SITE_OTHER): Payer: Medicare Other | Admitting: Family Medicine

## 2011-11-18 DIAGNOSIS — I10 Essential (primary) hypertension: Secondary | ICD-10-CM

## 2011-11-18 DIAGNOSIS — E119 Type 2 diabetes mellitus without complications: Secondary | ICD-10-CM

## 2011-11-18 DIAGNOSIS — E785 Hyperlipidemia, unspecified: Secondary | ICD-10-CM

## 2011-11-18 NOTE — Progress Notes (Signed)
  Subjective:    Patient ID: [name], male    DOB: [birth year], 78 y.o.   MRN: [id number]  HPI  Medical followup. Patient was seen last visit with extremely elevated blood pressure in the severe range at 99991111 systolic. He is already taking several blood pressure medications. We added amlodipine 5 mg daily and had extremely good response. He has gotten readings around 123456 systolic over the past few days. No dizziness. No orthostasis. Denies any headaches or peripheral edema. Compliant with all medications.  Recent labs reviewed with patient. Hemoglobin A1c 7.0%. Patient takes metformin. Creatinine had increased to 1.4. Possible elevation related to poorly controlled hypertension. He does not take any regular nonsteroidal. No obstructive urinary symptoms.  Past Medical History  Diagnosis Date  . Prostate ca   . CHF (congestive heart failure)   . Diabetes mellitus   . Hypertension   . S/P CABG (coronary artery bypass graft)   . S/P laparoscopic cholecystectomy   . Hernia     hernia repair x 3  . Other and unspecified diseases of appendix   . Pancreatitis    Past Surgical History  Procedure Date  . Appendectomy   . Hernia repair   . Carpal tunnel release 2004    both hands  . Laparoscopic cholecystectomy 09/2009  . Incision and drainage of right chest abscess   . Coronary artery bypass graft     reports that he has quit smoking. He has never used smokeless tobacco. He reports that he does not drink alcohol or use illicit drugs. family history is not on file. Allergies  Allergen Reactions  . Codeine   . Morphine     REACTION: does not like      Review of Systems  Constitutional: Negative for fatigue.  Eyes: Negative for visual disturbance.  Respiratory: Negative for cough, chest tightness and shortness of breath.   Cardiovascular: Negative for chest pain, palpitations and leg swelling.  Gastrointestinal: Negative for abdominal pain.  Genitourinary: Negative for  dysuria.  Neurological: Negative for dizziness, syncope, weakness, light-headedness and headaches.       Objective:   Physical Exam  Constitutional: He is oriented to person, place, and time. He appears well-developed and well-nourished. No distress.  HENT:  Mouth/Throat: Oropharynx is clear and moist.  Neck: Neck supple.  Cardiovascular: Normal rate and regular rhythm.   Pulmonary/Chest: Effort normal and breath sounds normal. No respiratory distress. He has no wheezes. He has no rales.  Musculoskeletal: He exhibits no edema.  Lymphadenopathy:    He has no cervical adenopathy.  Neurological: He is alert and oriented to person, place, and time.          Assessment & Plan:  #1 hypertension. Greatly improved. Continue amlodipine along with other medications. Reassess in 3 months #2 type 2 diabetes. Adequate controlled recent A1c 7.0%. Need to watch creatinine with patient on metformin #3 recent creatinine 1.4. Possible increase related to poorly controlled hypertension. Recheck basic metabolic panel in 3 months #4 history of CAD. Recent lipids LDL at goal. Continue Lipitor

## 2011-11-30 ENCOUNTER — Other Ambulatory Visit: Payer: Self-pay | Admitting: Cardiology

## 2011-12-03 ENCOUNTER — Other Ambulatory Visit: Payer: Self-pay

## 2011-12-03 MED ORDER — CLONIDINE HCL 0.3 MG PO TABS: 0.3000 mg | ORAL_TABLET | Freq: Two times a day (BID) | ORAL | Status: DC

## 2011-12-03 NOTE — Telephone Encounter (Signed)
..   Requested Prescriptions   Signed Prescriptions Disp Refills  . cloNIDine (CATAPRES) 0.3 MG tablet 60 tablet 1    Sig: Take 1 tablet (0.3 mg total) by mouth 2 (two) times daily.    Authorizing Provider: [name]    Ordering User: Ardis Hughs, Anndee Connett Jerilynn Mages

## 2011-12-31 ENCOUNTER — Other Ambulatory Visit: Payer: Self-pay | Admitting: Cardiology

## 2011-12-31 NOTE — Telephone Encounter (Signed)
Refilled generic coreg

## 2012-01-27 ENCOUNTER — Other Ambulatory Visit: Payer: Self-pay | Admitting: Family Medicine

## 2012-01-29 ENCOUNTER — Other Ambulatory Visit: Payer: Self-pay | Admitting: Family Medicine

## 2012-02-03 ENCOUNTER — Other Ambulatory Visit: Payer: Self-pay | Admitting: Cardiology

## 2012-02-06 ENCOUNTER — Other Ambulatory Visit: Payer: Self-pay | Admitting: Family Medicine

## 2012-02-15 ENCOUNTER — Encounter: Payer: Self-pay | Admitting: Family Medicine

## 2012-02-15 ENCOUNTER — Ambulatory Visit (INDEPENDENT_AMBULATORY_CARE_PROVIDER_SITE_OTHER): Payer: Medicare Other | Admitting: Family Medicine

## 2012-02-15 VITALS — BP 100/50 | Temp 97.8°F | Wt 200.0 lb

## 2012-02-15 DIAGNOSIS — I10 Essential (primary) hypertension: Secondary | ICD-10-CM

## 2012-02-15 DIAGNOSIS — C61 Malignant neoplasm of prostate: Secondary | ICD-10-CM

## 2012-02-15 DIAGNOSIS — F419 Anxiety disorder, unspecified: Secondary | ICD-10-CM

## 2012-02-15 DIAGNOSIS — E785 Hyperlipidemia, unspecified: Secondary | ICD-10-CM

## 2012-02-15 DIAGNOSIS — F411 Generalized anxiety disorder: Secondary | ICD-10-CM

## 2012-02-15 DIAGNOSIS — IMO0001 Reserved for inherently not codable concepts without codable children: Secondary | ICD-10-CM

## 2012-02-15 DIAGNOSIS — E119 Type 2 diabetes mellitus without complications: Secondary | ICD-10-CM

## 2012-02-15 LAB — BASIC METABOLIC PANEL
CO2: 23 mEq/L (ref 19–32)
Creatinine, Ser: 1.8 mg/dL — ABNORMAL HIGH (ref 0.4–1.5)
Glucose, Bld: 206 mg/dL — ABNORMAL HIGH (ref 70–99)
Sodium: 137 mEq/L (ref 135–145)

## 2012-02-15 LAB — PSA: PSA: 1.53 ng/mL (ref 0.10–4.00)

## 2012-02-15 LAB — HEMOGLOBIN A1C: Hgb A1c MFr Bld: 6.7 % — ABNORMAL HIGH (ref 4.6–6.5)

## 2012-02-15 MED ORDER — PIOGLITAZONE HCL 30 MG PO TABS: 30.0000 mg | ORAL_TABLET | Freq: Every day | ORAL | Status: DC

## 2012-02-15 MED ORDER — ALPRAZOLAM 1 MG PO TABS: 1.0000 mg | ORAL_TABLET | Freq: Three times a day (TID) | ORAL | Status: DC | PRN

## 2012-02-15 MED ORDER — POTASSIUM CHLORIDE CRYS ER 10 MEQ PO TBCR: EXTENDED_RELEASE_TABLET | ORAL | Status: DC

## 2012-02-15 MED ORDER — ISOSORBIDE MONONITRATE ER 60 MG PO TB24: 60.0000 mg | ORAL_TABLET | Freq: Every day | ORAL | Status: DC

## 2012-02-15 MED ORDER — METFORMIN HCL 1000 MG PO TABS: 1000.0000 mg | ORAL_TABLET | Freq: Two times a day (BID) | ORAL | Status: DC

## 2012-02-15 MED ORDER — CARVEDILOL 6.25 MG PO TABS: 6.2500 mg | ORAL_TABLET | Freq: Two times a day (BID) | ORAL | Status: DC

## 2012-02-15 NOTE — Progress Notes (Signed)
  Subjective:    Patient ID: [name], male    DOB: [birth year], 78 y.o.   MRN: [id number]  HPI  Medical followup. Patient has history of CAD , type 2 diabetes, hyperlipidemia, peripheral vascular disease, prostate cancer, and rosacea. Medications reviewed. Blood sugars have been relatively stable. Last A1c 7.0%. Not monitoring regularly. No symptoms of hyperglycemia. He takes diabetic medication including metformin and Actos. No recent edema issues. Recent creatinine 1.4. We had planned repeat basic metabolic panel today to make sure this is not elevating.  History of chronic anxiety. On alprazolam 1 mg every 8-12 hours as needed and has taken this for several years. Requesting refills.  History of CAD. Recent lipids at goal. Compliant with Lipitor. No recent exertional chest pain. No myalgias related to Lipitor.  Past Medical History  Diagnosis Date  . Prostate ca   . CHF (congestive heart failure)   . Diabetes mellitus   . Hypertension   . S/P CABG (coronary artery bypass graft)   . S/P laparoscopic cholecystectomy   . Hernia     hernia repair x 3  . Other and unspecified diseases of appendix   . Pancreatitis    Past Surgical History  Procedure Date  . Appendectomy   . Hernia repair   . Carpal tunnel release 2004    both hands  . Laparoscopic cholecystectomy 09/2009  . Incision and drainage of right chest abscess   . Coronary artery bypass graft     reports that he has quit smoking. He has never used smokeless tobacco. He reports that he does not drink alcohol or use illicit drugs. family history is not on file. Allergies  Allergen Reactions  . Codeine   . Morphine     REACTION: does not like      Review of Systems  Constitutional: Negative for fatigue.  Eyes: Negative for visual disturbance.  Respiratory: Negative for cough, chest tightness and shortness of breath.   Cardiovascular: Negative for chest pain, palpitations and leg swelling.  Neurological: Negative  for dizziness, syncope, weakness, light-headedness and headaches.  Psychiatric/Behavioral: Negative for confusion.       Objective:   Physical Exam  Constitutional: He is oriented to person, place, and time. He appears well-developed and well-nourished.  Neck: Neck supple. No thyromegaly present.  Cardiovascular: Normal rate and regular rhythm.  Exam reveals no gallop.   Pulmonary/Chest: Effort normal and breath sounds normal. He has no wheezes.  Musculoskeletal: He exhibits no edema.  Neurological: He is alert and oriented to person, place, and time.  Psychiatric: He has a normal mood and affect. His behavior is normal.          Assessment & Plan:  #1 type 2 diabetes. Recheck A1c. Continue yearly eye exams.  #2 hyperlipidemia. Recent LDL at goal. Continue Lipitor. #3 hypertension improved with recent addition of amlodipine. #4 history of chronic anxiety. Refill alprazolam for 4 months #5 history of prostate cancer. Status post TURP. Patient requesting PSA. No recent obstructive symptoms.

## 2012-02-25 ENCOUNTER — Other Ambulatory Visit: Payer: Self-pay | Admitting: Cardiology

## 2012-02-29 ENCOUNTER — Other Ambulatory Visit: Payer: Self-pay | Admitting: Cardiology

## 2012-03-01 ENCOUNTER — Other Ambulatory Visit: Payer: Self-pay | Admitting: Cardiology

## 2012-03-03 ENCOUNTER — Other Ambulatory Visit (INDEPENDENT_AMBULATORY_CARE_PROVIDER_SITE_OTHER): Payer: Medicare Other

## 2012-03-03 DIAGNOSIS — I10 Essential (primary) hypertension: Secondary | ICD-10-CM

## 2012-03-03 LAB — BASIC METABOLIC PANEL
CO2: 26 mEq/L (ref 19–32)
Chloride: 106 mEq/L (ref 96–112)
Potassium: 4.6 mEq/L (ref 3.5–5.1)
Sodium: 140 mEq/L (ref 135–145)

## 2012-03-04 ENCOUNTER — Other Ambulatory Visit: Payer: Self-pay | Admitting: Cardiology

## 2012-03-06 ENCOUNTER — Other Ambulatory Visit: Payer: Self-pay | Admitting: Family Medicine

## 2012-03-07 NOTE — Progress Notes (Signed)
Quick Note:  Pt informed on cell VM ______

## 2012-03-27 ENCOUNTER — Other Ambulatory Visit: Payer: Self-pay | Admitting: Cardiology

## 2012-03-27 NOTE — Telephone Encounter (Signed)
Requested Prescriptions   Pending Prescriptions Disp Refills  . spironolactone (ALDACTONE) 25 MG tablet [Pharmacy Med Name: SPIRONOLACTONE 25 MG TABLET] 30 tablet 3    Sig: TAKE 1 TABLET EVERY DAY

## 2012-03-31 LAB — HM DIABETES EYE EXAM

## 2012-04-08 ENCOUNTER — Other Ambulatory Visit: Payer: Self-pay | Admitting: Cardiology

## 2012-04-13 ENCOUNTER — Other Ambulatory Visit: Payer: Self-pay | Admitting: Cardiology

## 2012-04-14 ENCOUNTER — Other Ambulatory Visit: Payer: Self-pay | Admitting: Cardiology

## 2012-04-14 ENCOUNTER — Telehealth: Payer: Self-pay | Admitting: Cardiology

## 2012-04-14 MED ORDER — CLONIDINE HCL 0.3 MG PO TABS: 0.3000 mg | ORAL_TABLET | Freq: Two times a day (BID) | ORAL | Status: DC

## 2012-04-14 NOTE — Telephone Encounter (Signed)
New problem:  Patient wife calling was told from pharmacy that medication was decline. Patient is leaving town.

## 2012-04-14 NOTE — Telephone Encounter (Signed)
Please return call to patient at [phone].  Patient said she just spoke with [facility] and had some other issues to discuss

## 2012-04-17 ENCOUNTER — Telehealth (HOSPITAL_COMMUNITY): Payer: Self-pay

## 2012-04-17 ENCOUNTER — Telehealth: Payer: Self-pay | Admitting: Cardiology

## 2012-04-17 NOTE — Telephone Encounter (Signed)
Talked to patients wife about the last appointment the patient had and that he was to come back in a month for the results of the 2decho. There was a mix up on when the patient was to come back, Appointment was made for Aug 2013

## 2012-04-17 NOTE — Telephone Encounter (Signed)
Patient would like to speak with Guinevere Ferrari concerning previous discussion on last Friday 04/14/12, she can be reached at [phone]

## 2012-05-01 ENCOUNTER — Other Ambulatory Visit (HOSPITAL_COMMUNITY): Payer: Self-pay | Admitting: Urology

## 2012-05-01 DIAGNOSIS — C61 Malignant neoplasm of prostate: Secondary | ICD-10-CM

## 2012-05-10 ENCOUNTER — Encounter (HOSPITAL_COMMUNITY): Payer: Self-pay

## 2012-05-10 ENCOUNTER — Encounter (HOSPITAL_COMMUNITY)
Admission: RE | Admit: 2012-05-10 | Discharge: 2012-05-10 | Disposition: A | Discharge status: Home / Self Care | Payer: Medicare Other | Source: Ambulatory Visit | Attending: Urology | Admitting: Urology

## 2012-05-10 DIAGNOSIS — Z006 Encounter for examination for normal comparison and control in clinical research program: Secondary | ICD-10-CM | POA: Insufficient documentation

## 2012-05-10 DIAGNOSIS — C61 Malignant neoplasm of prostate: Secondary | ICD-10-CM | POA: Insufficient documentation

## 2012-05-10 MED ORDER — TECHNETIUM TC 99M MEDRONATE IV KIT
23.9000 | PACK | Freq: Once | INTRAVENOUS | Status: AC | PRN
  Administered 2012-05-10: 23.9 via INTRAVENOUS

## 2012-06-06 ENCOUNTER — Ambulatory Visit: Payer: Medicare Other | Admitting: Cardiology

## 2012-06-08 ENCOUNTER — Encounter: Payer: Self-pay | Admitting: Cardiology

## 2012-06-08 ENCOUNTER — Ambulatory Visit (INDEPENDENT_AMBULATORY_CARE_PROVIDER_SITE_OTHER): Payer: Medicare Other | Admitting: Cardiology

## 2012-06-08 VITALS — BP 117/61 | HR 48 | Ht 69.0 in | Wt 198.0 lb

## 2012-06-08 DIAGNOSIS — I6529 Occlusion and stenosis of unspecified carotid artery: Secondary | ICD-10-CM

## 2012-06-08 DIAGNOSIS — I2589 Other forms of chronic ischemic heart disease: Secondary | ICD-10-CM

## 2012-06-08 DIAGNOSIS — R0602 Shortness of breath: Secondary | ICD-10-CM

## 2012-06-08 DIAGNOSIS — R5383 Other fatigue: Secondary | ICD-10-CM

## 2012-06-08 DIAGNOSIS — E785 Hyperlipidemia, unspecified: Secondary | ICD-10-CM

## 2012-06-08 DIAGNOSIS — I2510 Atherosclerotic heart disease of native coronary artery without angina pectoris: Secondary | ICD-10-CM

## 2012-06-08 DIAGNOSIS — I10 Essential (primary) hypertension: Secondary | ICD-10-CM

## 2012-06-08 LAB — TSH: TSH: 2.26 u[IU]/mL (ref 0.35–5.50)

## 2012-06-08 LAB — BRAIN NATRIURETIC PEPTIDE: Pro B Natriuretic peptide (BNP): 162 pg/mL — ABNORMAL HIGH (ref 0.0–100.0)

## 2012-06-08 MED ORDER — CLONIDINE HCL 0.3 MG PO TABS: 0.1500 mg | ORAL_TABLET | Freq: Two times a day (BID) | ORAL | Status: DC

## 2012-06-08 NOTE — Patient Instructions (Addendum)
Decrease Clonidine to 0.3 mg to 1/2 tablet a day Continue all other medications as listed  Please have blood work today BNP/TSH  Follow up in 6 weeks with Dr Percival Spanish

## 2012-06-08 NOTE — Progress Notes (Signed)
HPI The patient presents for followup of CAD. Since I last saw him he said no acute cardiac problems. He was having some chest discomfort and shortness of breath and had a stress perfusion study. This was interpreted as a low risk stress nuclear study.  He does have some chest discomfort occasionally but it is unchanged from previous.  It is sporadic and he thinks that it is related to reflux. He gets short of breath with activities which has been chronic.  He is not describing new PND or orthopnea. His biggest complaint has been dizziness without syncope. He does have some orthostatic symptoms. He also has significant chronic fatigue.  Allergies  Allergen Reactions  . Codeine   . Morphine     REACTION: does not like    Current Outpatient Prescriptions  Medication Sig Dispense Refill  . ALPRAZolam (XANAX) 1 MG tablet Take 1 tablet (1 mg total) by mouth 3 (three) times daily as needed.  90 tablet  3  . amLODipine (NORVASC) 5 MG tablet Take 1 tablet (5 mg total) by mouth daily.  90 tablet  3  . aspirin 81 MG tablet Take 81 mg by mouth daily.        Marland Kitchen atorvastatin (LIPITOR) 80 MG tablet TAKE 1/2 TABLET ONCE DAILY  15 tablet  10  . benazepril (LOTENSIN) 40 MG tablet Take 1/2 tab by mouth twice  daily   30 tablet  11  . carvedilol (COREG) 6.25 MG tablet Take 1 tablet (6.25 mg total) by mouth 2 (two) times daily with a meal.  60 tablet  5  . cloNIDine (CATAPRES) 0.3 MG tablet Take 1 tablet (0.3 mg total) by mouth 2 (two) times daily.  60 tablet  3  . clopidogrel (PLAVIX) 75 MG tablet TAKE 1 TABLET (75 MG TOTAL) BY MOUTH DAILY.  30 tablet  11  . furosemide (LASIX) 40 MG tablet Take 1 tablet (40 mg total) by mouth daily.  30 tablet  6  . glucose blood (FREESTYLE LITE) test strip 1 each by Other route as needed. Use as instructed       . isosorbide mononitrate (IMDUR) 60 MG 24 hr tablet Take 1 tablet (60 mg total) by mouth daily.  30 tablet  11  . Lancets (FREESTYLE) lancets 1 each by Other route  as needed. Use as instructed       . metFORMIN (GLUCOPHAGE) 500 MG tablet TAKE 1 TABLET BY MOUTH TWICE A DAY WITH MEALS  60 tablet  11  . METRONIDAZOLE, TOPICAL, (METROLOTION) 0.75 % LOTN Apply 1 application topically daily.  1 Bottle  11  . nitroGLYCERIN (NITROSTAT) 0.4 MG SL tablet Place 1 tablet (0.4 mg total) under the tongue every 5 (five) minutes as needed.  25 tablet  1  . pantoprazole (PROTONIX) 40 MG tablet TAKE 1 TABLET BY MOUTH EVERY DAY  30 tablet  11  . pioglitazone (ACTOS) 30 MG tablet Take 1 tablet (30 mg total) by mouth daily.  30 tablet  11  . potassium chloride (KLOR-CON M10) 10 MEQ tablet Take as directed with Lasix as needed  30 tablet  6  . spironolactone (ALDACTONE) 25 MG tablet TAKE 1 TABLET EVERY DAY  30 tablet  3  . traMADol (ULTRAM) 50 MG tablet Take 1 tablet (50 mg total) by mouth every 6 (six) hours as needed. 1-2 every 6 h prn pain  120 tablet  5  . DISCONTD: carvedilol (COREG) 6.25 MG tablet Take 1 tablet (6.25 mg total)  by mouth 2 (two) times daily with a meal.  60 tablet  11  . DISCONTD: metFORMIN (GLUCOPHAGE) 1000 MG tablet Take 1 tablet (1,000 mg total) by mouth 2 (two) times daily with a meal.  60 tablet  11    Past Medical History  Diagnosis Date  . Prostate ca   . CHF (congestive heart failure)   . Diabetes mellitus   . Hypertension   . S/P CABG (coronary artery bypass graft)   . S/P laparoscopic cholecystectomy   . Hernia     hernia repair x 3  . Other and unspecified diseases of appendix   . Pancreatitis     Past Surgical History  Procedure Date  . Appendectomy   . Hernia repair   . Carpal tunnel release 2004    both hands  . Laparoscopic cholecystectomy 09/2009  . Incision and drainage of right chest abscess   . Coronary artery bypass graft     ROS: As stated in the HPI and negative for all other systems.  PHYSICAL EXAM BP 117/61  Pulse 48  Ht 5\' 9"  (1.753 m)  Wt 198 lb (89.812 kg)  BMI 29.24 kg/m2 GENERAL:  Well appearing HEENT:   Pupils equal round and reactive, fundi not visualized, oral mucosa unremarkable NECK:  No jugular venous distention, waveform within normal limits, carotid upstroke brisk and symmetric, no bruits, no thyromegaly LYMPHATICS:  No cervical, inguinal adenopathy LUNGS:  Clear to auscultation bilaterally BACK:  No CVA tenderness CHEST:  Well healed sternotomy scar. HEART:  PMI not displaced or sustained,S1 and S2 within normal limits, no S3, no S4, no clicks, no rubs, no murmurs ABD:  Flat, positive bowel sounds normal in frequency in pitch, no bruits, no rebound, no guarding, no midline pulsatile mass, no hepatomegaly, no splenomegaly EXT:  2 plus pulses throughout, no edema, no cyanosis no clubbing SKIN:  No rashes no nodules NEURO:  Cranial nerves II through XII grossly intact, motor grossly intact throughout PSYCH:  Cognitively intact, oriented to person place and time   EKG:  Sinus bradycardia, rate 48, left bundle branch block.  Unchanged from previous 06/08/2012  ASSESSMENT AND PLAN  CORONARY ARTERY DISEASE  At this time I do not suspect any change in his coronary disease and no further cardiovascular testing is suggested. He will continue to have risk reduction.  DYSPNEA ON EXERTION  I did increase his diuretic in response to an elevated BNP. However, doesn't think this changed his symptoms very much. I will check another BNP level. He'll otherwise continue on the meds as listed. His dyspnea is likely multifactorial but not related to ischemia. He is not in overt heart failure.  FATIGUE  This may be in part related to polypharmacy, bradycardia and now his blood pressure being low. My goal is to back off on his medications. First I will reduce his clonidine in half and at the next visit perhaps get rid of it. A likely have to reduce his beta blocker dose unfortunately as well. I will check a TSH and a BNP today.

## 2012-06-12 ENCOUNTER — Telehealth: Payer: Self-pay

## 2012-06-12 NOTE — Telephone Encounter (Signed)
Left message with wife to let her husband know that his labs were ok

## 2012-06-12 NOTE — Telephone Encounter (Signed)
Message copied by Audria Nine on Mon Jun 12, 2012 10:54 AM ------      Message from: [name]      Created: Thu Jun 08, 2012  6:19 PM       Thyroid and BNP OK.

## 2012-06-13 ENCOUNTER — Other Ambulatory Visit: Payer: Self-pay | Admitting: Cardiology

## 2012-06-14 NOTE — Telephone Encounter (Signed)
Fax Received. Refill Completed. [name] (R.M.A)   

## 2012-06-19 ENCOUNTER — Other Ambulatory Visit: Payer: Self-pay | Admitting: Cardiology

## 2012-06-20 NOTE — Telephone Encounter (Signed)
..   Requested Prescriptions   Pending Prescriptions Disp Refills  . benazepril (LOTENSIN) 40 MG tablet [Pharmacy Med Name: BENAZEPRIL HCL 40 MG TABLET] 30 tablet 6    Sig: TAKE 1/2 TABLET BY MOUTH TWICE DAILY

## 2012-06-24 ENCOUNTER — Other Ambulatory Visit: Payer: Self-pay | Admitting: Cardiology

## 2012-07-15 ENCOUNTER — Other Ambulatory Visit: Payer: Self-pay | Admitting: Cardiology

## 2012-07-20 ENCOUNTER — Ambulatory Visit (INDEPENDENT_AMBULATORY_CARE_PROVIDER_SITE_OTHER): Payer: Medicare Other | Admitting: Cardiology

## 2012-07-20 ENCOUNTER — Encounter: Payer: Self-pay | Admitting: Cardiology

## 2012-07-20 VITALS — BP 119/60 | HR 53 | Ht 69.0 in | Wt 192.8 lb

## 2012-07-20 DIAGNOSIS — I2510 Atherosclerotic heart disease of native coronary artery without angina pectoris: Secondary | ICD-10-CM

## 2012-07-20 DIAGNOSIS — I2589 Other forms of chronic ischemic heart disease: Secondary | ICD-10-CM

## 2012-07-20 DIAGNOSIS — I6529 Occlusion and stenosis of unspecified carotid artery: Secondary | ICD-10-CM

## 2012-07-20 DIAGNOSIS — I10 Essential (primary) hypertension: Secondary | ICD-10-CM

## 2012-07-20 NOTE — Patient Instructions (Addendum)
Please stop your clonidine by taking 1/2 a tablet for 3 days and then stop. Continue all other medications as listed.  Follow up in December with Dr Percival Spanish

## 2012-07-20 NOTE — Progress Notes (Signed)
HPI The patient presents for followup of CAD.  At the last visit he was complaining predominantly of fatigue.  Given his low heart rate and blood pressures I backed off on his clonidine by reducing the dose in half. Since that time he says he feels better.  He is doing more.he has less of  "sicky" feeling but he attributes this to no longer drinking orange juice. He denies any palpitations, presyncope or syncope. He still gets dyspneic with activities such as bending over. He still gets lightheaded or dizzy with this kind of activity. He's not having any chest pressure, neck or arm discomfort. He's had no PND or orthopnea. He does still describe some fatigue but again this is improved.   Allergies  Allergen Reactions  . Codeine   . Morphine     REACTION: does not like    Current Outpatient Prescriptions  Medication Sig Dispense Refill  . ALPRAZolam (XANAX) 1 MG tablet Take 1 tablet (1 mg total) by mouth 3 (three) times daily as needed.  90 tablet  3  . amLODipine (NORVASC) 5 MG tablet Take 1 tablet (5 mg total) by mouth daily.  90 tablet  3  . aspirin 81 MG tablet Take 81 mg by mouth daily.        Marland Kitchen atorvastatin (LIPITOR) 80 MG tablet TAKE 1/2 TABLET ONCE DAILY  15 tablet  10  . benazepril (LOTENSIN) 40 MG tablet TAKE 1/2 TABLET BY MOUTH TWICE DAILY  30 tablet  6  . carvedilol (COREG) 6.25 MG tablet Take 1 tablet (6.25 mg total) by mouth 2 (two) times daily with a meal.  60 tablet  5  . cloNIDine (CATAPRES) 0.3 MG tablet Take 0.5 tablets (0.15 mg total) by mouth 2 (two) times daily.  60 tablet  3  . clopidogrel (PLAVIX) 75 MG tablet TAKE 1 TABLET (75 MG TOTAL) BY MOUTH DAILY.  30 tablet  11  . furosemide (LASIX) 40 MG tablet TAKE 1 TABLET (40 MG TOTAL) BY MOUTH DAILY.  30 tablet  6  . isosorbide mononitrate (IMDUR) 60 MG 24 hr tablet Take 1 tablet (60 mg total) by mouth daily.  30 tablet  11  . Lancets (FREESTYLE) lancets 1 each by Other route as needed. Use as instructed       . metFORMIN  (GLUCOPHAGE) 500 MG tablet TAKE 1 TABLET BY MOUTH TWICE A DAY WITH MEALS  60 tablet  11  . METRONIDAZOLE, TOPICAL, (METROLOTION) 0.75 % LOTN Apply 1 application topically daily.  1 Bottle  11  . nitroGLYCERIN (NITROSTAT) 0.4 MG SL tablet Place 1 tablet (0.4 mg total) under the tongue every 5 (five) minutes as needed.  25 tablet  1  . pantoprazole (PROTONIX) 40 MG tablet TAKE 1 TABLET BY MOUTH EVERY DAY  30 tablet  11  . pioglitazone (ACTOS) 30 MG tablet Take 1 tablet (30 mg total) by mouth daily.  30 tablet  11  . potassium chloride (KLOR-CON M10) 10 MEQ tablet Take as directed with Lasix as needed  30 tablet  6  . spironolactone (ALDACTONE) 25 MG tablet TAKE 1 TABLET EVERY DAY  30 tablet  9  . traMADol (ULTRAM) 50 MG tablet Take 1 tablet (50 mg total) by mouth every 6 (six) hours as needed. 1-2 every 6 h prn pain  120 tablet  5  . glucose blood (FREESTYLE LITE) test strip 1 each by Other route as needed. Use as instructed  Past Medical History  Diagnosis Date  . Prostate ca   . CHF (congestive heart failure)   . Diabetes mellitus   . Hypertension   . S/P CABG (coronary artery bypass graft)   . S/P laparoscopic cholecystectomy   . Hernia     hernia repair x 3  . Other and unspecified diseases of appendix   . Pancreatitis     Past Surgical History  Procedure Date  . Appendectomy   . Hernia repair   . Carpal tunnel release 2004    both hands  . Laparoscopic cholecystectomy 09/2009  . Incision and drainage of right chest abscess   . Coronary artery bypass graft     ROS: As stated in the HPI and negative for all other systems.  PHYSICAL EXAM BP 119/60  Pulse 53  Ht 5\' 9"  (1.753 m)  Wt 87.454 kg (192 lb 12.8 oz)  BMI 28.47 kg/m2 GENERAL:  Well appearing HEENT:  Pupils equal round and reactive, fundi not visualized, oral mucosa unremarkable NECK:  No jugular venous distention, waveform within normal limits, carotid upstroke brisk and symmetric, no bruits, no  thyromegaly LYMPHATICS:  No cervical, inguinal adenopathy LUNGS:  Clear to auscultation bilaterally BACK:  No CVA tenderness CHEST:  Well healed sternotomy scar. HEART:  PMI not displaced or sustained,S1 and S2 within normal limits, no S3, no S4, no clicks, no rubs, apical systolic early peaking radiating slightly to the axilla murmur ABD:  Flat, positive bowel sounds normal in frequency in pitch, no bruits, no rebound, no guarding, no midline pulsatile mass, no hepatomegaly, no splenomegaly EXT:  2 plus pulses throughout, no edema, no cyanosis no clubbing SKIN:  No rashes no nodules NEURO:  Cranial nerves II through XII grossly intact, motor grossly intact throughout PSYCH:  Cognitively intact, oriented to person place and time   EKG:  Sinus bradycardia, rate 48, left bundle branch block.  Unchanged from previous 07/20/2012  ASSESSMENT AND PLAN  CORONARY ARTERY DISEASE  At this time I do not suspect any change in his coronary disease and no further cardiovascular testing is suggested. He will continue to have risk reduction.  DYSPNEA ON EXERTION  I repeated a BNP at the last visit if it was actually lower than he has been before. Given this I do not think any of his symptoms are heart failure. No change in therapy is indicated.  FATIGUE  This may be in part related to polypharmacy, bradycardia and now his blood pressure being low.  Today and going to have him stop his clonidine by tapering off completely. He will remain on the other medications as listed.

## 2012-08-12 ENCOUNTER — Other Ambulatory Visit: Payer: Self-pay | Admitting: Cardiology

## 2012-08-16 ENCOUNTER — Encounter: Payer: Self-pay | Admitting: Family Medicine

## 2012-08-16 ENCOUNTER — Ambulatory Visit (INDEPENDENT_AMBULATORY_CARE_PROVIDER_SITE_OTHER): Payer: Medicare Other | Admitting: Family Medicine

## 2012-08-16 VITALS — BP 160/68 | Temp 98.0°F | Wt 198.0 lb

## 2012-08-16 DIAGNOSIS — N189 Chronic kidney disease, unspecified: Secondary | ICD-10-CM

## 2012-08-16 DIAGNOSIS — E785 Hyperlipidemia, unspecified: Secondary | ICD-10-CM

## 2012-08-16 DIAGNOSIS — I10 Essential (primary) hypertension: Secondary | ICD-10-CM

## 2012-08-16 DIAGNOSIS — C61 Malignant neoplasm of prostate: Secondary | ICD-10-CM

## 2012-08-16 DIAGNOSIS — N289 Disorder of kidney and ureter, unspecified: Secondary | ICD-10-CM

## 2012-08-16 DIAGNOSIS — E119 Type 2 diabetes mellitus without complications: Secondary | ICD-10-CM

## 2012-08-16 LAB — BASIC METABOLIC PANEL
Calcium: 9.1 mg/dL (ref 8.4–10.5)
Chloride: 109 mEq/L (ref 96–112)
Creatinine, Ser: 1.4 mg/dL (ref 0.4–1.5)
Sodium: 144 mEq/L (ref 135–145)

## 2012-08-16 LAB — HEMOGLOBIN A1C: Hgb A1c MFr Bld: 6.3 % (ref 4.6–6.5)

## 2012-08-16 MED ORDER — AMLODIPINE BESYLATE 10 MG PO TABS: 10.0000 mg | ORAL_TABLET | Freq: Every day | ORAL | Status: DC

## 2012-08-16 NOTE — Progress Notes (Signed)
  Subjective:    Patient ID: [name], male    DOB: [birth year], 78 y.o.   MRN: [id number]  HPI  Multiple chronic problems include history of type diabetes, CAD, peripheral vasc disease, hypertension, hyperlipidemia, kidney stones, chronic kidney disease, prostate cancer. Recent increase in PSA. Followed by urology. Recent bone scan no evidence for metastases. Patient recently tapered off clonidine. Blood pressure starting to climb by home readings up around [id number] systolic. No recent increased peripheral edema. Compliant with all medications.  Type 2 diabetes. Not monitoring blood sugars regularly. A1c's have been stable. No symptoms of hyperglycemia. No chest pains. No dizziness. Already received flu vaccine  Past Medical History  Diagnosis Date  . Prostate ca   . CHF (congestive heart failure)   . Diabetes mellitus   . Hypertension   . S/P CABG (coronary artery bypass graft)   . S/P laparoscopic cholecystectomy   . Hernia     hernia repair x 3  . Other and unspecified diseases of appendix   . Pancreatitis    Past Surgical History  Procedure Date  . Appendectomy   . Hernia repair   . Carpal tunnel release 2004    both hands  . Laparoscopic cholecystectomy 09/2009  . Incision and drainage of right chest abscess   . Coronary artery bypass graft     reports that he quit smoking about 40 years ago. He has never used smokeless tobacco. He reports that he does not drink alcohol or use illicit drugs. family history is not on file. Allergies  Allergen Reactions  . Codeine   . Morphine     REACTION: does not like      Review of Systems  Constitutional: Negative for fatigue and unexpected weight change.  Eyes: Negative for visual disturbance.  Respiratory: Negative for cough, chest tightness and shortness of breath.   Cardiovascular: Negative for chest pain, palpitations and leg swelling.  Neurological: Negative for dizziness, syncope, weakness, light-headedness and headaches.         Objective:   Physical Exam  Constitutional: He appears well-developed and well-nourished.  Neck: Neck supple. No thyromegaly present.  Cardiovascular: Normal rate and regular rhythm.   Pulmonary/Chest: Effort normal and breath sounds normal. No respiratory distress. He has no wheezes. He has no rales.  Musculoskeletal: He exhibits no edema.  Lymphadenopathy:    He has no cervical adenopathy.          Assessment & Plan:  #1 type 2 diabetes. History of good control. Recheck A1c #2 chronic kidney disease. Recheck basic metabolic panel #3 hypertension. Poorly controlled by todays reading. Repeat left arm seated after rest 160/64. Cautious titration amlodipine 10 mg daily and watch for peripheral edema. Patient had better blood pressure control on clonidine but had some fatigue issues.

## 2012-08-16 NOTE — Patient Instructions (Addendum)
Watch for increased swelling in ankles and legs.

## 2012-08-17 NOTE — Progress Notes (Signed)
Quick Note:  Pt wife informed ______

## 2012-08-23 HISTORY — PX: CYSTOSCOPY: SUR368

## 2012-08-29 ENCOUNTER — Encounter: Payer: Self-pay | Admitting: *Deleted

## 2012-08-29 NOTE — Progress Notes (Signed)
New Consult New Consult Hx Prostate Cancer and rising PSA 2005 Radical prostatectomy with PLND, gleason=3+4=7,PSA=6.75, 07/2012  PSA=2.1 04/2012=1.74, 02/2012=1.47 01/2012=1.5 2011=0.31 07/2007=0.24 11/2006=0.2  Married,2 children, alert,oriented x3, no dysuria, doesn't completely empty bladder, then has some leakage, regular bowel movements to discuss radiation , gets sharp pains in bladder area at times,     Allergies:Codeine derivatives,Morphine derivatives

## 2012-08-30 ENCOUNTER — Ambulatory Visit
Admission: RE | Admit: 2012-08-30 | Discharge: 2012-08-30 | Disposition: A | Discharge status: Home / Self Care | Payer: Medicare Other | Source: Ambulatory Visit | Attending: Radiation Oncology | Admitting: Radiation Oncology

## 2012-08-30 ENCOUNTER — Encounter: Payer: Self-pay | Admitting: Radiation Oncology

## 2012-08-30 VITALS — BP 154/53 | HR 58 | Temp 97.9°F | Resp 20 | Ht 69.5 in | Wt 197.3 lb

## 2012-08-30 DIAGNOSIS — I252 Old myocardial infarction: Secondary | ICD-10-CM | POA: Insufficient documentation

## 2012-08-30 DIAGNOSIS — Z951 Presence of aortocoronary bypass graft: Secondary | ICD-10-CM | POA: Insufficient documentation

## 2012-08-30 DIAGNOSIS — I129 Hypertensive chronic kidney disease with stage 1 through stage 4 chronic kidney disease, or unspecified chronic kidney disease: Secondary | ICD-10-CM | POA: Insufficient documentation

## 2012-08-30 DIAGNOSIS — E7800 Pure hypercholesterolemia, unspecified: Secondary | ICD-10-CM | POA: Insufficient documentation

## 2012-08-30 DIAGNOSIS — E119 Type 2 diabetes mellitus without complications: Secondary | ICD-10-CM | POA: Insufficient documentation

## 2012-08-30 DIAGNOSIS — C61 Malignant neoplasm of prostate: Secondary | ICD-10-CM

## 2012-08-30 DIAGNOSIS — N189 Chronic kidney disease, unspecified: Secondary | ICD-10-CM | POA: Insufficient documentation

## 2012-08-30 DIAGNOSIS — Z79899 Other long term (current) drug therapy: Secondary | ICD-10-CM | POA: Insufficient documentation

## 2012-08-30 DIAGNOSIS — K219 Gastro-esophageal reflux disease without esophagitis: Secondary | ICD-10-CM | POA: Insufficient documentation

## 2012-08-30 DIAGNOSIS — M129 Arthropathy, unspecified: Secondary | ICD-10-CM | POA: Insufficient documentation

## 2012-08-30 DIAGNOSIS — Z87891 Personal history of nicotine dependence: Secondary | ICD-10-CM | POA: Insufficient documentation

## 2012-08-30 DIAGNOSIS — I509 Heart failure, unspecified: Secondary | ICD-10-CM | POA: Insufficient documentation

## 2012-08-30 NOTE — Progress Notes (Addendum)
[facility] NEW PATIENT EVALUATION  Name: [name] [name] MRN: [id number]  Date:   08/30/2012           DOB: [birth year]  Status: outpatient   CC: Shawn Post, MD  Donne Anon*    REFERRING PHYSICIAN: Donne Anon*   DIAGNOSIS:  PSA recurrent carcinoma the prostate  HISTORY OF PRESENT ILLNESS:  [name] is a 78 y.o. male who is seen today for the courtesy of Dr. Junious Silk for discussion of possible salvage radiation therapy in the management of his PSA recurrent carcinoma the prostate. Back in June 2005 he had a PSA of 6.75 with prostate biopsies from the right left lobes revealing Gleason 7 (3+4) adenocarcinoma with focal perineural invasion. On 05/20/2004 he underwent a radical prostatectomy by Dr. Alona Bene. His then have Gleason 7 (4+3) involving the right left lobes with tumor extending into both seminal vesicles. Surgical margins were negative. 2 lymph nodes from each side were negative for metastatic disease. He was given a pathologic stage P. T3 B. N0. His PSA nadired to 0.07. It rose to 0.2 by favor 2008, 0.24 by October 2008, 0.31 and 2011, one 0.5 by April 2013, 1.74 by July 2013, and most recently 2.1 from 08/16/2012. This past July, understand that he had CT scans of his abdomen/pelvis along the bone scan which were without evidence for metastatic disease. He remains recently active initially doing well from a GU and GI standpoint, although he does have occasional urinary leakage. He does not wear a pad or diaper. He has a significant cardiac history in that he had a CABG and also placement of stents a few years ago. He is also diabetic.  PREVIOUS RADIATION THERAPY: No   PAST MEDICAL HISTORY:  has a past medical history of CHF (congestive heart failure); Diabetes mellitus; Hypertension; S/P CABG (coronary artery bypass graft); S/P laparoscopic cholecystectomy; Hernia; Other and unspecified diseases of appendix;  Pancreatitis; Prostate ca (03/01/04); Incontinence; Myocardial infarction; Arthritis; GERD (gastroesophageal reflux disease); Ulcer; Hypercholesterolemia; Heart murmur; Chronic kidney disease; Cataract; and Allergy.     PAST SURGICAL HISTORY:  Past Surgical History  Procedure Date  . Appendectomy   . Hernia repair   . Carpal tunnel release 2004    both hands  . Laparoscopic cholecystectomy 09/2009  . Incision and drainage of right chest abscess   . Coronary artery bypass graft   . Robot assisted laparoscopic radical prostatectomy 2005  . Back surgery   . Rectal surgery   . Cystoscopy 08/23/12    incomplete emoptying bladder     FAMILY HISTORY: family history includes Cancer in his mother. His father died from a heart attack at 39. His mother died from what sounds like kidney or stomach cancer at age 22. No family history of prostate cancer.   SOCIAL HISTORY:  reports that he quit smoking about 40 years ago. His smoking use included Cigarettes. He has a 2.5 pack-year smoking history. He has never used smokeless tobacco. He reports that he does not drink alcohol or use illicit drugs. Married, 2 children, 3 grandchildren, and 3 great-grandchildren. He started a number of small businesses.   ALLERGIES: Codeine and Morphine   MEDICATIONS:  Current Outpatient Prescriptions  Medication Sig Dispense Refill  . amLODipine (NORVASC) 10 MG tablet Take 1 tablet (10 mg total) by mouth daily.  30 tablet  11  . atorvastatin (LIPITOR) 80 MG tablet TAKE 1/2 TABLET ONCE DAILY  15 tablet  10  .  benazepril (LOTENSIN) 40 MG tablet TAKE 1/2 TABLET BY MOUTH TWICE DAILY  30 tablet  6  . beta carotene w/minerals (OCUVITE) tablet Take 1 tablet by mouth daily.      . carvedilol (COREG) 6.25 MG tablet Take 1 tablet (6.25 mg total) by mouth 2 (two) times daily with a meal.  60 tablet  5  . clopidogrel (PLAVIX) 75 MG tablet TAKE 1 TABLET (75 MG TOTAL) BY MOUTH DAILY.  30 tablet  11  . furosemide (LASIX) 40 MG  tablet TAKE 1 TABLET (40 MG TOTAL) BY MOUTH DAILY.  30 tablet  6  . glucose blood (FREESTYLE LITE) test strip 1 each by Other route as needed. Use as instructed       . isosorbide mononitrate (IMDUR) 60 MG 24 hr tablet Take 1 tablet (60 mg total) by mouth daily.  30 tablet  11  . Lancets (FREESTYLE) lancets 1 each by Other route as needed. Use as instructed       . metFORMIN (GLUCOPHAGE) 500 MG tablet TAKE 1 TABLET BY MOUTH TWICE A DAY WITH MEALS  60 tablet  11  . METRONIDAZOLE, TOPICAL, (METROLOTION) 0.75 % LOTN Apply 1 application topically daily.  1 Bottle  11  . nitroGLYCERIN (NITROSTAT) 0.4 MG SL tablet Place 1 tablet (0.4 mg total) under the tongue every 5 (five) minutes as needed.  25 tablet  1  . pantoprazole (PROTONIX) 40 MG tablet TAKE 1 TABLET BY MOUTH EVERY DAY  30 tablet  11  . pioglitazone (ACTOS) 30 MG tablet Take 1 tablet (30 mg total) by mouth daily.  30 tablet  11  . potassium chloride (KLOR-CON M10) 10 MEQ tablet Take as directed with Lasix as needed  30 tablet  6  . spironolactone (ALDACTONE) 25 MG tablet TAKE 1 TABLET EVERY DAY  30 tablet  9  . traMADol (ULTRAM) 50 MG tablet Take 1 tablet (50 mg total) by mouth every 6 (six) hours as needed. 1-2 every 6 h prn pain  120 tablet  5  . ALPRAZolam (XANAX) 1 MG tablet Take 1 tablet (1 mg total) by mouth 3 (three) times daily as needed.  90 tablet  3  . aspirin 81 MG tablet Take 81 mg by mouth daily.           REVIEW OF SYSTEMS:  Pertinent items are noted in HPI.    PHYSICAL EXAM:  height is 5' 9.5" (1.765 m) and weight is 197 lb 4.8 oz (89.495 kg). His temperature is 97.9 F (36.6 C). His blood pressure is 154/53 and his pulse is 58. His respiration is 20.   Alert and oriented 78 year old white male appearing slightly younger than stated age. Head and neck examination: Grossly unremarkable. Nodes: Without palpable cervical or supraclavicular lymphadenopathy. Chest: Anterior sternotomy scar. Lungs clear. Back: Without spinal or  CVA tenderness. Heart: Regular in rhythm. Abdomen: Without masses organomegaly. Genitalia: He does have a penile prosthesis. Rectal: The prostate bed is flat and is without masses or nodularity. Extremities: Without edema. Neurologic examination: Grossly nonfocal.   LABORATORY DATA:  Lab Results  Component Value Date   WBC 8.3 02/24/2011   HGB 12.0* 02/24/2011   HCT 35.5* 02/24/2011   MCV 81.1 02/24/2011   PLT 165 02/24/2011   Lab Results  Component Value Date   NA 144 08/16/2012   K 4.4 08/16/2012   CL 109 08/16/2012   CO2 27 08/16/2012   Lab Results  Component Value Date   ALT 15 11/04/2011  AST 14 11/04/2011   ALKPHOS 87 11/04/2011   BILITOT 0.8 11/04/2011   PSA 2.1 from 08/16/2012.   IMPRESSION: PSA recurrent carcinoma the prostate. I explained to the patient and his wife that pathologic stage T3b  disease increases the risk for a local regional failure. Clinical factors which predict for local failure alone include the presence of a positive margin, involving the seminal vesicles, a disease-free interval, a slow rise in his PSA and a Gleason score of 7 (3+4) or Gleason 6 (3+3). The strongest clinical predictor is a positive margin. In his favor for the possibility of a local recurrence alone is involvement of the seminal vesicles and a disease-free interval. Not in his favor is a negative surgical margin and a Gleason score of 7 (4+3). Ideally, we prefer to "salvage" the patient's with a PSA  below 0.5. With his PSA just over 2 he is more likely to have occult metastatic disease even  if he has a local failure. Lastly, considering his cardiac history and medical comorbidities he is probably likely die from a cardiac event rather than metastatic prostate cancer. Obviously, the patient likely delay androgen deprivation therapy which could be delayed for number of years depending on his PSA velocity. This will be addressed by Dr. Junious Silk. We talked about the role of salvage radiation therapy and  the potential acute and late toxicities. I do not recommend radiation therapy for the above mentioned reasons.   PLAN: As discussed above.   I spent 60 minutes minutes face to face with the patient and more than 50% of that time was spent in counseling and/or coordination of care.

## 2012-08-30 NOTE — Progress Notes (Signed)
Please see the Nurse Progress Note in the MD Initial Consult Encounter for this patient. 

## 2012-08-31 ENCOUNTER — Ambulatory Visit (INDEPENDENT_AMBULATORY_CARE_PROVIDER_SITE_OTHER): Payer: Medicare Other | Admitting: Family Medicine

## 2012-08-31 ENCOUNTER — Encounter: Payer: Self-pay | Admitting: Family Medicine

## 2012-08-31 VITALS — BP 170/80 | Temp 97.6°F | Wt 200.0 lb

## 2012-08-31 DIAGNOSIS — I10 Essential (primary) hypertension: Secondary | ICD-10-CM

## 2012-08-31 DIAGNOSIS — L259 Unspecified contact dermatitis, unspecified cause: Secondary | ICD-10-CM

## 2012-08-31 DIAGNOSIS — L300 Nummular dermatitis: Secondary | ICD-10-CM

## 2012-08-31 MED ORDER — TRIAMCINOLONE ACETONIDE 0.1 % EX CREA: TOPICAL_CREAM | Freq: Two times a day (BID) | CUTANEOUS | Status: DC

## 2012-08-31 NOTE — Addendum Note (Signed)
Encounter addended by: Deirdre Evener, RN on: 08/31/2012  3:02 PM<BR>     Documentation filed: Charges VN

## 2012-08-31 NOTE — Progress Notes (Signed)
Subjective:    Patient ID: [name], male    DOB: 18-Sep-1933, 78 y.o.   MRN: [id number]  HPI  Patient seen for followup regarding hypertension.   Elevated last visit. Refer to prior note. He is on multidrug regimen. We titrated his amlodipine to 10 mg. No side effects. No peripheral edema. Home blood pressure readings are reviewed and consistently ranging between [id number] systolic and 0000000 systolic.Prostate cancer. Followed by urology and radiation oncology. He has decided against radiation therapy this time. Recent bone scan no evidence for metastatic disease  New problem with pruritic rash mostly lower legs. Nummular. Wife thought this represented tenia corporis and started topical antifungal without any change. Moderate pruritus. No alleviating factors.verall feels well.  Past Medical History  Diagnosis Date  . CHF (congestive heart failure)   . Diabetes mellitus   . Hypertension   . S/P CABG (coronary artery bypass graft)   . S/P laparoscopic cholecystectomy   . Hernia     hernia repair x 3  . Other and unspecified diseases of appendix   . Pancreatitis   . Prostate ca 03/01/04    prostate bx=Adenocarcinoam,gleason 3+4=7,PSA=6.75  . Incontinence     hx  over 1 year,leaks without awareness  . Myocardial infarction   . Arthritis   . GERD (gastroesophageal reflux disease)   . Ulcer     hx gastric  . Hypercholesterolemia   . Heart murmur   . Chronic kidney disease     nephrolithiasis  . Cataract     surgery,B/L  . Allergy    Past Surgical History  Procedure Date  . Appendectomy   . Hernia repair   . Carpal tunnel release 2004    both hands  . Laparoscopic cholecystectomy 09/2009  . Incision and drainage of right chest abscess   . Coronary artery bypass graft   . Robot assisted laparoscopic radical prostatectomy 2005  . Back surgery   . Rectal surgery   . Cystoscopy 08/23/12    incomplete emoptying bladder    reports that he quit smoking about 40 years ago. His smoking  use included Cigarettes. He has a 2.5 pack-year smoking history. He has never used smokeless tobacco. He reports that he does not drink alcohol or use illicit drugs. family history includes Cancer in his mother. Allergies  Allergen Reactions  . Codeine   . Morphine     REACTION: does not like        Review of Systems  Constitutional: Negative for fatigue.  Eyes: Negative for visual disturbance.  Respiratory: Negative for cough, chest tightness and shortness of breath.   Cardiovascular: Negative for chest pain, palpitations and leg swelling.  Neurological: Negative for dizziness, syncope, weakness, light-headedness and headaches.       Objective:   Physical Exam  Constitutional: He appears well-developed and well-nourished.  Neck: Neck supple. No thyromegaly present.  Cardiovascular: Normal rate and regular rhythm.  Exam reveals no gallop.   Pulmonary/Chest: Effort normal and breath sounds normal. No respiratory distress. He has no wheezes. He has no rales.  Musculoskeletal: He exhibits no edema.  Skin:       Patient has a few scattered areas of rash on lower legs. Nummular-type distribution and scaly with mild erythematous base. No pustules. No vesicles. Nontender           Assessment & Plan:  #1 hypertension isolated systolic. Controlled by home readings. Continue current regimen. #2 skin rash lower legs. Suspect nummular eczema. Triamcinolone 0.1% cream twice  a day as needed

## 2012-08-31 NOTE — Addendum Note (Signed)
Encounter addended by: Deirdre Evener, RN on: 08/31/2012  5:48 PM<BR>     Documentation filed: Charges VN

## 2012-09-05 NOTE — Addendum Note (Signed)
Encounter addended by: Deirdre Evener, RN on: 09/05/2012  5:59 PM<BR>     Documentation filed: Charges VN

## 2012-09-27 ENCOUNTER — Ambulatory Visit (INDEPENDENT_AMBULATORY_CARE_PROVIDER_SITE_OTHER): Payer: Medicare Other | Admitting: Cardiology

## 2012-09-27 ENCOUNTER — Encounter: Payer: Self-pay | Admitting: Cardiology

## 2012-09-27 ENCOUNTER — Other Ambulatory Visit: Payer: Self-pay

## 2012-09-27 VITALS — BP 146/65 | HR 61 | Resp 16 | Ht 69.0 in | Wt 198.0 lb

## 2012-09-27 DIAGNOSIS — I2510 Atherosclerotic heart disease of native coronary artery without angina pectoris: Secondary | ICD-10-CM

## 2012-09-27 DIAGNOSIS — I6529 Occlusion and stenosis of unspecified carotid artery: Secondary | ICD-10-CM

## 2012-09-27 DIAGNOSIS — R0989 Other specified symptoms and signs involving the circulatory and respiratory systems: Secondary | ICD-10-CM

## 2012-09-27 DIAGNOSIS — I10 Essential (primary) hypertension: Secondary | ICD-10-CM

## 2012-09-27 NOTE — Progress Notes (Signed)
HPI The patient presents for followup of exertional dyspnea, fatigue, hypertension, chest pains.  At the last visit he was complaining predominantly of fatigue, which slightly improved when clonidine was reduced to half dose, and then discontinued. In the interim, he has seen [name], MD and norvasc was increased to compensate for elevated pressure. He was also diagnosed with prostate cancer and considering treatment options.  Today patient continues to endorse fatigue, exertional dyspnea with most activities and recurrence of chest pains. He is unsure if any of the medication changes actually improved the symptoms. He is poor historian, but states the left sternal pain onsets sometimes with slight exertion. Other times occurs while sitting. Radiates to left shoulder and jaw. Lasts 15-20 minutes prior to resolution. Nothing improves this really.   He still gets dyspneic with activities such as bending over, which happens even without any chest pains. He still gets lightheaded or dizzy with this kind of activity. He's had no PND or orthopnea. His weight is stable and he endorses medication compliance. Denies increased edema, cough, fever, abdominal pain, difficulty emptying bladder. He endorses some dribbling urinary incontinence at times.    Allergies  Allergen Reactions  . Codeine   . Morphine     REACTION: does not like    Current Outpatient Prescriptions  Medication Sig Dispense Refill  . ALPRAZolam (XANAX) 1 MG tablet Take 1 tablet (1 mg total) by mouth 3 (three) times daily as needed.  90 tablet  3  . amLODipine (NORVASC) 10 MG tablet Take 1 tablet (10 mg total) by mouth daily.  30 tablet  11  . aspirin 81 MG tablet Take 81 mg by mouth daily.        Marland Kitchen atorvastatin (LIPITOR) 80 MG tablet TAKE 1/2 TABLET ONCE DAILY  15 tablet  10  . benazepril (LOTENSIN) 40 MG tablet TAKE 1/2 TABLET BY MOUTH TWICE DAILY  30 tablet  6  . beta carotene w/minerals (OCUVITE) tablet Take 1 tablet by  mouth daily.      . carvedilol (COREG) 6.25 MG tablet Take 1 tablet (6.25 mg total) by mouth 2 (two) times daily with a meal.  60 tablet  5  . clopidogrel (PLAVIX) 75 MG tablet TAKE 1 TABLET (75 MG TOTAL) BY MOUTH DAILY.  30 tablet  11  . furosemide (LASIX) 40 MG tablet TAKE 1 TABLET (40 MG TOTAL) BY MOUTH DAILY.  30 tablet  6  . glucose blood (FREESTYLE LITE) test strip 1 each by Other route as needed. Use as instructed       . isosorbide mononitrate (IMDUR) 60 MG 24 hr tablet Take 1 tablet (60 mg total) by mouth daily.  30 tablet  11  . Lancets (FREESTYLE) lancets 1 each by Other route as needed. Use as instructed       . metFORMIN (GLUCOPHAGE) 500 MG tablet TAKE 1 TABLET BY MOUTH TWICE A DAY WITH MEALS  60 tablet  11  . METRONIDAZOLE, TOPICAL, (METROLOTION) 0.75 % LOTN Apply 1 application topically daily.  1 Bottle  11  . nitroGLYCERIN (NITROSTAT) 0.4 MG SL tablet Place 1 tablet (0.4 mg total) under the tongue every 5 (five) minutes as needed.  25 tablet  1  . pantoprazole (PROTONIX) 40 MG tablet TAKE 1 TABLET BY MOUTH EVERY DAY  30 tablet  11  . pioglitazone (ACTOS) 30 MG tablet Take 1 tablet (30 mg total) by mouth daily.  30 tablet  11  . potassium chloride (KLOR-CON M10) 10 MEQ  tablet Take as directed with Lasix as needed  30 tablet  6  . spironolactone (ALDACTONE) 25 MG tablet TAKE 1 TABLET EVERY DAY  30 tablet  9  . traMADol (ULTRAM) 50 MG tablet Take 1 tablet (50 mg total) by mouth every 6 (six) hours as needed. 1-2 every 6 h prn pain  120 tablet  5  . triamcinolone cream (KENALOG) 0.1 % Apply topically 2 (two) times daily.  45 g  1    Past Medical History  Diagnosis Date  . CHF (congestive heart failure)   . Diabetes mellitus   . Hypertension   . S/P CABG (coronary artery bypass graft)   . S/P laparoscopic cholecystectomy   . Hernia     hernia repair x 3  . Other and unspecified diseases of appendix   . Pancreatitis   . Prostate ca 03/01/04    prostate bx=Adenocarcinoam,gleason  3+4=7,PSA=6.75  . Incontinence     hx  over 1 year,leaks without awareness  . Myocardial infarction   . Arthritis   . GERD (gastroesophageal reflux disease)   . Ulcer     hx gastric  . Hypercholesterolemia   . Heart murmur   . Chronic kidney disease     nephrolithiasis  . Cataract     surgery,B/L  . Allergy     Past Surgical History  Procedure Date  . Appendectomy   . Hernia repair   . Carpal tunnel release 2004    both hands  . Laparoscopic cholecystectomy 09/2009  . Incision and drainage of right chest abscess   . Coronary artery bypass graft   . Robot assisted laparoscopic radical prostatectomy 2005  . Back surgery   . Rectal surgery   . Cystoscopy 08/23/12    incomplete emoptying bladder    ROS: As stated in the HPI and negative for all other systems.  PHYSICAL EXAM BP 146/65  Pulse 61  Resp 16  Ht 5\' 9"  (1.753 m)  Wt 198 lb (89.812 kg)  BMI 29.24 kg/m2 GENERAL:  Well appearing HEENT:  Pupils equal round and reactive, oral mucosa unremarkable NECK:  No jugular venous distention, waveform within normal limits, carotid upstroke brisk and symmetric, soft right bruit. LYMPHATICS:  No cervical, inguinal adenopathy LUNGS:  Diminished sounds bilateral bases. No wheezing or distress. CHEST:  Well healed sternotomy scar. HEART:  PMI not displaced or sustained,S1 and S2 within normal limits, no S3, no S4, no clicks, no rubs, Grade II/VI apical systolic early peaking radiating slightly to the axilla murmur ABD:  Flat, positive bowel sounds normal in frequency in pitch, no bruits, no rebound, no guarding, no midline pulsatile mass, no hepatomegaly, no splenomegaly EXT:  2 plus pulses throughout, no cyanosis no clubbing. Trace RLE edema. SKIN:  No rashes no nodules NEURO:  Cranial nerves II through XII grossly intact, motor grossly intact throughout PSYCH:  Cognitively intact, oriented to person place and time   EKG: Regular rhythm with ectopy.Probable sinus rhythm the P  waves her difficult to discern. Rate 57, left bundle branch block 09/27/2012  ASSESSMENT AND PLAN  CORONARY ARTERY DISEASE  Low risk stress perfusion study in March 2012.  DYSPNEA ON EXERTION  BNP at the last visit if it was actually lower than he has been before, seems to make CHF less likely as etiology of symptoms.  Consider repeat echo, last in 2012 showing grade 2 diastolic dysfunction and EF 50-55%.  Consider trial off actos  FATIGUE  Previous thought to be caused by  orthostasis or polypharmacy with antihypertensives. Today does not seem to feel any improvement with adjustments. There is some worsening renal function, but no overt signs of volume overload.   Hypertension Improved with norvasc.   Carotid stenosis-Bilateral  Last eval April 2011 has bilateral 40-59% stenosis.   History and all data above reviewed.  Patient examined.  I agree with the findings as above.  Unfortunately he continues to feel poorly with multiple somatic complaints. Overall he thinks he is about the same as he has been with significant fatigue. He does have dyspnea on exertion. This may be about the same as previous. We discussed his sleep patterns at some length and he has poor sleep habits. The patient exam reveals [id number] rate and rhythm  ,  Lungs: clear to auscultation bilaterally  ,  Abd: Positive bowel sounds, no rebound no guarding, Ext Trace edema  .  All available labs, radiology testing, previous records reviewed. Agree with documented assessment and plan. His chest pain pattern is unchanged from previous. He had a stress perfusion study in 2012 and the symptoms are not new. I don't think this needs to be reevaluated. I will order an echocardiogram to further evaluate his ejection fraction given his ongoing dyspnea. I will order a followup carotid Doppler as he is overdue for this. Finally I will check a 24-hour Holter monitor as he does have bradycardia which could be contributing to some of his  fatigue. For now his medications will remain unchanged.  Jeneen Rinks Hochrein  11:47 AM  09/27/2012

## 2012-09-27 NOTE — Progress Notes (Signed)
Patient ID: [name], male   DOB: 1933-01-11, 78 y.o.   MRN: [id number] PLACED A 24 HR HOLTER MONITOR AND WENT OVER INSTRUCTIONS WHEN TO RETURN MONITOR

## 2012-09-27 NOTE — Patient Instructions (Addendum)
The current medical regimen is effective;  continue present plan and medications.  Your physician has requested that you have an echocardiogram. Echocardiography is a painless test that uses sound waves to create images of your heart. It provides your doctor with information about the size and shape of your heart and how well your heart's chambers and valves are working. This procedure takes approximately one hour. There are no restrictions for this procedure.  Your physician has requested that you have a carotid duplex. This test is an ultrasound of the carotid arteries in your neck. It looks at blood flow through these arteries that supply the brain with blood. Allow one hour for this exam. There are no restrictions or special instructions.  Your physician has recommended that you wear a holter monitor. Holter monitors are medical devices that record the heart's electrical activity. Doctors most often use these monitors to diagnose arrhythmias. Arrhythmias are problems with the speed or rhythm of the heartbeat. The monitor is a small, portable device. You can wear one while you do your normal daily activities. This is usually used to diagnose what is causing palpitations/syncope (passing out).  Follow up with Dr Percival Spanish after all testing is complete.

## 2012-09-28 ENCOUNTER — Ambulatory Visit (HOSPITAL_COMMUNITY): Payer: Medicare Other | Attending: Cardiology | Admitting: Radiology

## 2012-09-28 DIAGNOSIS — I509 Heart failure, unspecified: Secondary | ICD-10-CM | POA: Insufficient documentation

## 2012-09-28 DIAGNOSIS — I379 Nonrheumatic pulmonary valve disorder, unspecified: Secondary | ICD-10-CM | POA: Insufficient documentation

## 2012-09-28 DIAGNOSIS — I080 Rheumatic disorders of both mitral and aortic valves: Secondary | ICD-10-CM | POA: Insufficient documentation

## 2012-09-28 DIAGNOSIS — I2510 Atherosclerotic heart disease of native coronary artery without angina pectoris: Secondary | ICD-10-CM | POA: Insufficient documentation

## 2012-09-28 DIAGNOSIS — E119 Type 2 diabetes mellitus without complications: Secondary | ICD-10-CM | POA: Insufficient documentation

## 2012-09-28 DIAGNOSIS — I10 Essential (primary) hypertension: Secondary | ICD-10-CM | POA: Insufficient documentation

## 2012-09-28 DIAGNOSIS — I359 Nonrheumatic aortic valve disorder, unspecified: Secondary | ICD-10-CM

## 2012-09-28 DIAGNOSIS — R0609 Other forms of dyspnea: Secondary | ICD-10-CM

## 2012-09-28 NOTE — Progress Notes (Signed)
Echocardiogram performed.  

## 2012-09-29 ENCOUNTER — Encounter (INDEPENDENT_AMBULATORY_CARE_PROVIDER_SITE_OTHER): Payer: Medicare Other

## 2012-09-29 DIAGNOSIS — R0602 Shortness of breath: Secondary | ICD-10-CM

## 2012-09-29 DIAGNOSIS — R0989 Other specified symptoms and signs involving the circulatory and respiratory systems: Secondary | ICD-10-CM

## 2012-09-29 DIAGNOSIS — I6529 Occlusion and stenosis of unspecified carotid artery: Secondary | ICD-10-CM

## 2012-09-29 NOTE — Progress Notes (Signed)
Patient ID: [name], male   DOB: [birth year], 78 y.o.   MRN: [id number] Placed a second 24 hr holter monitor on the paient the first one did not record

## 2012-10-02 ENCOUNTER — Other Ambulatory Visit: Payer: Self-pay | Admitting: Cardiology

## 2012-10-05 ENCOUNTER — Encounter: Payer: Self-pay | Admitting: Cardiology

## 2012-10-05 ENCOUNTER — Ambulatory Visit (INDEPENDENT_AMBULATORY_CARE_PROVIDER_SITE_OTHER): Payer: Medicare Other | Admitting: Cardiology

## 2012-10-05 VITALS — BP 160/85 | HR 61 | Ht 69.0 in | Wt 196.8 lb

## 2012-10-05 DIAGNOSIS — I2510 Atherosclerotic heart disease of native coronary artery without angina pectoris: Secondary | ICD-10-CM

## 2012-10-05 NOTE — Progress Notes (Signed)
HPI The patient presents for followup of CAD.  At the last visit he was complaining predominantly of fatigue.  After last visit I ordered a monitor which demonstrated no symptomatic bradycardia arrhythmias. His average heart rate was in the 60s. He had infrequent ectopy. Echo demonstrated well-preserved ejection fraction. Carotid Doppler did demonstrate some increased stenosis. Today the patient continues to have the symptoms as described previously with predominantly fatigue and dyspnea with exertion. He's not describing any new chest pressure, neck or arm discomfort. He's not describing any new palpitations, presyncope or syncope. He's had no weight gain or edema.  Allergies  Allergen Reactions  . Codeine   . Morphine     REACTION: does not like    Current Outpatient Prescriptions  Medication Sig Dispense Refill  . ALPRAZolam (XANAX) 1 MG tablet Take 1 tablet (1 mg total) by mouth 3 (three) times daily as needed.  90 tablet  3  . amLODipine (NORVASC) 10 MG tablet Take 1 tablet (10 mg total) by mouth daily.  30 tablet  11  . aspirin 81 MG tablet Take 81 mg by mouth daily.        Marland Kitchen atorvastatin (LIPITOR) 80 MG tablet TAKE 1/2 TABLET ONCE DAILY  15 tablet  10  . benazepril (LOTENSIN) 40 MG tablet TAKE 1/2 TABLET BY MOUTH TWICE DAILY  30 tablet  6  . beta carotene w/minerals (OCUVITE) tablet Take 1 tablet by mouth daily.      . carvedilol (COREG) 6.25 MG tablet TAKE 1 TABLET BY MOUTH TWICE A DAY WITH A MEAL  60 tablet  5  . clopidogrel (PLAVIX) 75 MG tablet TAKE 1 TABLET (75 MG TOTAL) BY MOUTH DAILY.  30 tablet  11  . furosemide (LASIX) 40 MG tablet TAKE 1 TABLET (40 MG TOTAL) BY MOUTH DAILY.  30 tablet  6  . glucose blood (FREESTYLE LITE) test strip 1 each by Other route as needed. Use as instructed       . isosorbide mononitrate (IMDUR) 60 MG 24 hr tablet Take 1 tablet (60 mg total) by mouth daily.  30 tablet  11  . Lancets (FREESTYLE) lancets 1 each by Other route as needed. Use as  instructed       . metFORMIN (GLUCOPHAGE) 500 MG tablet TAKE 1 TABLET BY MOUTH TWICE A DAY WITH MEALS  60 tablet  11  . METRONIDAZOLE, TOPICAL, (METROLOTION) 0.75 % LOTN Apply 1 application topically daily.  1 Bottle  11  . nitroGLYCERIN (NITROSTAT) 0.4 MG SL tablet Place 1 tablet (0.4 mg total) under the tongue every 5 (five) minutes as needed.  25 tablet  1  . pantoprazole (PROTONIX) 40 MG tablet TAKE 1 TABLET BY MOUTH EVERY DAY  30 tablet  11  . pioglitazone (ACTOS) 30 MG tablet Take 1 tablet (30 mg total) by mouth daily.  30 tablet  11  . potassium chloride (KLOR-CON M10) 10 MEQ tablet Take as directed with Lasix as needed  30 tablet  6  . spironolactone (ALDACTONE) 25 MG tablet TAKE 1 TABLET EVERY DAY  30 tablet  9  . traMADol (ULTRAM) 50 MG tablet Take 1 tablet (50 mg total) by mouth every 6 (six) hours as needed. 1-2 every 6 h prn pain  120 tablet  5  . triamcinolone cream (KENALOG) 0.1 % Apply topically 2 (two) times daily.  45 g  1    Past Medical History  Diagnosis Date  . CHF (congestive heart failure)   . Diabetes  mellitus   . Hypertension   . S/P CABG (coronary artery bypass graft)   . S/P laparoscopic cholecystectomy   . Hernia     hernia repair x 3  . Other and unspecified diseases of appendix   . Pancreatitis   . Prostate ca 03/01/04    prostate bx=Adenocarcinoam,gleason 3+4=7,PSA=6.75  . Incontinence     hx  over 1 year,leaks without awareness  . Myocardial infarction   . Arthritis   . GERD (gastroesophageal reflux disease)   . Ulcer     hx gastric  . Hypercholesterolemia   . Heart murmur   . Chronic kidney disease     nephrolithiasis  . Cataract     surgery,B/L  . Allergy     Past Surgical History  Procedure Date  . Appendectomy   . Hernia repair   . Carpal tunnel release 2004    both hands  . Laparoscopic cholecystectomy 09/2009  . Incision and drainage of right chest abscess   . Coronary artery bypass graft   . Robot assisted laparoscopic radical  prostatectomy 2005  . Back surgery   . Rectal surgery   . Cystoscopy 08/23/12    incomplete emoptying bladder    ROS: As stated in the HPI and negative for all other systems.  PHYSICAL EXAM BP 160/85  Pulse 61  Ht 5\' 9"  (1.753 m)  Wt 196 lb 12.8 oz (89.268 kg)  BMI 29.06 kg/m2 GENERAL:  Well appearing HEENT:  Pupils equal round and reactive, fundi not visualized, oral mucosa unremarkable NECK:  No jugular venous distention, waveform within normal limits, carotid upstroke brisk and symmetric, no bruits, no thyromegaly LUNGS:  Clear to auscultation bilaterally BACK:  No CVA tenderness CHEST:  Well healed sternotomy scar. HEART:  PMI not displaced or sustained,S1 and S2 within normal limits, no S3, no S4, no clicks, no rubs, apical systolic early peaking radiating slightly to the axilla murmur ABD:  Flat, positive bowel sounds normal in frequency in pitch, no bruits, no rebound, no guarding, no midline pulsatile mass, no hepatomegaly, no splenomegaly EXT:  2 plus pulses throughout, no edema, no cyanosis no clubbing   ASSESSMENT AND PLAN   CORONARY ARTERY DISEASE  Low risk stress perfusion study in March 2012.   DYSPNEA ON EXERTION  His BNP was recently normal. The echo demonstrated no suggestion of a cardiac etiology. No further workup is suggested.  FATIGUE  I do not see a clear cardiac etiology for this. I will send him back to [name], MD to consider possibly stopping the Actos.  Hypertension  The blood pressure is at target. No change in medications is indicated. We will continue with therapeutic lifestyle changes (TLC).  Carotid stenosis-Bilateral  The patient now has bilateral 60-79% stenosis. He will be followed again in 6 months.  Bradycardia Monitor demonstrated no symptomatic bradycardia arrhythmias. He had predominately sinus rhythm with occasional ventricular and atrial ectopy. No further workup is suggested.

## 2012-10-05 NOTE — Patient Instructions (Addendum)
The current medical regimen is effective;  continue present plan and medications.  Follow up in 1 year with Dr Hochrein.  You will receive a letter in the mail 2 months before you are due.  Please call us when you receive this letter to schedule your follow up appointment.  

## 2012-10-24 ENCOUNTER — Other Ambulatory Visit: Payer: Self-pay | Admitting: Cardiology

## 2012-10-27 ENCOUNTER — Other Ambulatory Visit: Payer: Self-pay | Admitting: Family Medicine

## 2012-12-01 ENCOUNTER — Other Ambulatory Visit: Payer: Self-pay | Admitting: Cardiology

## 2012-12-04 ENCOUNTER — Telehealth: Payer: Self-pay

## 2012-12-04 ENCOUNTER — Other Ambulatory Visit: Payer: Self-pay

## 2012-12-04 MED ORDER — CLOPIDOGREL BISULFATE 75 MG PO TABS: ORAL_TABLET | ORAL | Status: DC

## 2012-12-04 NOTE — Telephone Encounter (Signed)
Pt wi

## 2012-12-29 ENCOUNTER — Other Ambulatory Visit: Payer: Self-pay | Admitting: Cardiology

## 2013-01-14 ENCOUNTER — Other Ambulatory Visit: Payer: Self-pay | Admitting: Cardiology

## 2013-01-23 ENCOUNTER — Other Ambulatory Visit: Payer: Self-pay | Admitting: Cardiology

## 2013-01-23 ENCOUNTER — Other Ambulatory Visit: Payer: Self-pay | Admitting: Internal Medicine

## 2013-02-19 ENCOUNTER — Other Ambulatory Visit: Payer: Self-pay | Admitting: Family Medicine

## 2013-02-28 ENCOUNTER — Encounter: Payer: Self-pay | Admitting: Family Medicine

## 2013-02-28 ENCOUNTER — Ambulatory Visit (INDEPENDENT_AMBULATORY_CARE_PROVIDER_SITE_OTHER): Payer: Medicare PPO | Admitting: Family Medicine

## 2013-02-28 ENCOUNTER — Ambulatory Visit: Payer: Medicare Other | Admitting: Family Medicine

## 2013-02-28 VITALS — BP 130/60 | Temp 98.3°F | Wt 193.0 lb

## 2013-02-28 DIAGNOSIS — E785 Hyperlipidemia, unspecified: Secondary | ICD-10-CM

## 2013-02-28 DIAGNOSIS — E119 Type 2 diabetes mellitus without complications: Secondary | ICD-10-CM

## 2013-02-28 DIAGNOSIS — I10 Essential (primary) hypertension: Secondary | ICD-10-CM

## 2013-02-28 DIAGNOSIS — R229 Localized swelling, mass and lump, unspecified: Secondary | ICD-10-CM

## 2013-02-28 LAB — HEPATIC FUNCTION PANEL
ALT: 14 U/L (ref 0–53)
AST: 13 U/L (ref 0–37)
Albumin: 4 g/dL (ref 3.5–5.2)
Alkaline Phosphatase: 75 U/L (ref 39–117)
Bilirubin, Direct: 0.1 mg/dL (ref 0.0–0.3)
Total Protein: 7.4 g/dL (ref 6.0–8.3)

## 2013-02-28 LAB — BASIC METABOLIC PANEL
CO2: 23 mEq/L (ref 19–32)
Calcium: 9.4 mg/dL (ref 8.4–10.5)
Creatinine, Ser: 1.9 mg/dL — ABNORMAL HIGH (ref 0.4–1.5)
GFR: 36.85 mL/min — ABNORMAL LOW (ref >60.00)
Sodium: 138 mEq/L (ref 135–145)

## 2013-02-28 LAB — HM DIABETES FOOT EXAM

## 2013-02-28 LAB — HEMOGLOBIN A1C: Hgb A1c MFr Bld: 6.3 % (ref 4.6–6.5)

## 2013-02-28 LAB — LIPID PANEL
Cholesterol: 126 mg/dL (ref 0–200)
Triglycerides: 168 mg/dL — ABNORMAL HIGH (ref 0.0–149.0)

## 2013-02-28 MED ORDER — SITAGLIPTIN PHOSPHATE 50 MG PO TABS: 50.0000 mg | ORAL_TABLET | Freq: Every day | ORAL | Status: DC

## 2013-02-28 MED ORDER — ALPRAZOLAM 1 MG PO TABS: 1.0000 mg | ORAL_TABLET | Freq: Three times a day (TID) | ORAL | Status: DC | PRN

## 2013-02-28 NOTE — Progress Notes (Signed)
Subjective:    Patient ID: [name], male    DOB: 1932-10-29, 79 y.o.   MRN: [id number]  HPI  Patient seen for followup multiple medical problems Chronic problems include history of prostate cancer, type 2 diabetes, hyperlipidemia, hypertension, CAD, ischemic cardiomyopathy, peripheral vascular disease, GERD, chronic insomnia. Medications reviewed. He's been on alprazolam which takes just at night for many years. No recent chest pains. Does have some lower extremity pain with ambulation and he attributes this mostly arthritis. No recent chest pains or dyspnea above baseline  He has slightly tender or nodular growth dorsum left hand. Present for several months. Slowly growing.  Diabetes been stable. Last A1c 6.3%. Takes Actos 30 mg daily. No recent edema issues. We discussed possible change of medication cause of issues of edema with Actos.  Past Medical History  Diagnosis Date  . CHF (congestive heart failure)   . Diabetes mellitus   . Hypertension   . S/P CABG (coronary artery bypass graft)   . S/P laparoscopic cholecystectomy   . Hernia     hernia repair x 3  . Other and unspecified diseases of appendix   . Pancreatitis   . Prostate ca 03/01/04    prostate bx=Adenocarcinoam,gleason 3+4=7,PSA=6.75  . Incontinence     hx  over 1 year,leaks without awareness  . Myocardial infarction   . Arthritis   . GERD (gastroesophageal reflux disease)   . Ulcer     hx gastric  . Hypercholesterolemia   . Heart murmur   . Chronic kidney disease     nephrolithiasis  . Cataract     surgery,B/L  . Allergy    Past Surgical History  Procedure Laterality Date  . Appendectomy    . Hernia repair    . Carpal tunnel release  2004    both hands  . Laparoscopic cholecystectomy  09/2009  . Incision and drainage of right chest abscess    . Coronary artery bypass graft    . Robot assisted laparoscopic radical prostatectomy  2005  . Back surgery    . Rectal surgery    . Cystoscopy  08/23/12     incomplete emoptying bladder    reports that he quit smoking about 40 years ago. His smoking use included Cigarettes. He has a 2.5 pack-year smoking history. He has never used smokeless tobacco. He reports that he does not drink alcohol or use illicit drugs. family history includes Cancer in his mother. Allergies  Allergen Reactions  . Codeine   . Morphine     REACTION: does not like      Review of Systems  Constitutional: Negative for appetite change, fatigue and unexpected weight change.  Eyes: Negative for visual disturbance.  Respiratory: Negative for cough, chest tightness and shortness of breath.   Cardiovascular: Negative for chest pain, palpitations and leg swelling.  Musculoskeletal: Positive for arthralgias.  Neurological: Negative for dizziness, syncope, weakness, light-headedness and headaches.       Objective:   Physical Exam  Constitutional: He appears well-developed and well-nourished.  HENT:  Right Ear: External ear normal.  Left Ear: External ear normal.  Mouth/Throat: Oropharynx is clear and moist.  Neck: Neck supple. No thyromegaly present.  Cardiovascular: Normal rate and regular rhythm.   Pulmonary/Chest: Effort normal and breath sounds normal. No respiratory distress. He has no wheezes. He has no rales.  Skin:  Feet reveal no skin lesions. Good distal foot pulses. Good capillary refill. No calluses. Normal sensation with monofilament testing  Patient has hyperkeratotic  thickened nodular lesion about 8 mm diameter dorsum of left hand.           Assessment & Plan:  #1 type 2 diabetes. History of good control. We discussed change of medications to get all for Actos. Discontinue Actos. Start Januvia 50 mg once daily. Continue metformin. Repeat A1c. Continue yearly eye exam #2 hypertension. Controlled #3 history of chronic insomnia. Refill alprazolam. #4 hyperlipidemia. Recheck lipid and hepatic panel #5 nodular skin lesion dorsum left hand. Rule  out squamous cell carcinoma. Dermatology referral made

## 2013-02-28 NOTE — Patient Instructions (Signed)
Stop Actos and start Januvia once daily

## 2013-03-01 ENCOUNTER — Telehealth: Payer: Self-pay | Admitting: Family Medicine

## 2013-03-01 ENCOUNTER — Other Ambulatory Visit: Payer: Self-pay | Admitting: *Deleted

## 2013-03-01 ENCOUNTER — Other Ambulatory Visit: Payer: Self-pay | Admitting: Family Medicine

## 2013-03-01 DIAGNOSIS — E860 Dehydration: Secondary | ICD-10-CM

## 2013-03-01 NOTE — Progress Notes (Signed)
Quick Note:  Pt informed and he voiced his understanding. ______

## 2013-03-01 NOTE — Telephone Encounter (Signed)
PT wife called and stated that Dr. Zenaida Niece is not accepting [employer]. Pt wife wanted to know if there where any other dermotologist you could refer her to that accept her insurance. Please assist.

## 2013-03-01 NOTE — Telephone Encounter (Signed)
[facility]

## 2013-03-01 NOTE — Telephone Encounter (Signed)
I called pt, she has a list of providers [employer] recommended. [place], MD [facility]ology Derm Allyn Kenner MD

## 2013-03-01 NOTE — Telephone Encounter (Signed)
[name].  Let me know if a new referral needs to be done.  Thanks, nn

## 2013-03-26 ENCOUNTER — Other Ambulatory Visit: Payer: Self-pay | Admitting: Cardiology

## 2013-03-26 ENCOUNTER — Other Ambulatory Visit (INDEPENDENT_AMBULATORY_CARE_PROVIDER_SITE_OTHER): Payer: Medicare PPO

## 2013-03-26 DIAGNOSIS — E860 Dehydration: Secondary | ICD-10-CM

## 2013-03-26 LAB — BASIC METABOLIC PANEL
CO2: 22 mEq/L (ref 19–32)
Calcium: 9.6 mg/dL (ref 8.4–10.5)
Creatinine, Ser: 1.7 mg/dL — ABNORMAL HIGH (ref 0.4–1.5)
Glucose, Bld: 86 mg/dL (ref 70–99)

## 2013-03-28 NOTE — Progress Notes (Signed)
Quick Note:  Pt wife informed ______

## 2013-03-29 ENCOUNTER — Encounter (INDEPENDENT_AMBULATORY_CARE_PROVIDER_SITE_OTHER): Payer: Medicare PPO

## 2013-03-29 DIAGNOSIS — I6529 Occlusion and stenosis of unspecified carotid artery: Secondary | ICD-10-CM

## 2013-04-16 ENCOUNTER — Other Ambulatory Visit: Payer: Self-pay | Admitting: Family Medicine

## 2013-05-14 ENCOUNTER — Other Ambulatory Visit: Payer: Self-pay | Admitting: Cardiology

## 2013-05-23 ENCOUNTER — Other Ambulatory Visit: Payer: Self-pay | Admitting: Cardiology

## 2013-05-31 ENCOUNTER — Encounter: Payer: Self-pay | Admitting: Family Medicine

## 2013-05-31 ENCOUNTER — Ambulatory Visit (INDEPENDENT_AMBULATORY_CARE_PROVIDER_SITE_OTHER): Payer: Medicare PPO | Admitting: Family Medicine

## 2013-05-31 VITALS — BP 132/70 | HR 60 | Temp 98.0°F | Wt 199.0 lb

## 2013-05-31 DIAGNOSIS — E119 Type 2 diabetes mellitus without complications: Secondary | ICD-10-CM

## 2013-05-31 DIAGNOSIS — L570 Actinic keratosis: Secondary | ICD-10-CM

## 2013-05-31 DIAGNOSIS — C44621 Squamous cell carcinoma of skin of unspecified upper limb, including shoulder: Secondary | ICD-10-CM

## 2013-05-31 DIAGNOSIS — C44629 Squamous cell carcinoma of skin of left upper limb, including shoulder: Secondary | ICD-10-CM

## 2013-05-31 LAB — HEMOGLOBIN A1C: Hgb A1c MFr Bld: 5.8 % (ref 4.6–6.5)

## 2013-05-31 NOTE — Progress Notes (Signed)
  Subjective:    Patient ID: [name], male    DOB: January 08, 1933, 79 y.o.   MRN: [id number]  HPI  Patient here for the following Type 2 diabetes. History of good control. Last A1c 6.3%. He does have history of cardiomyopathy and we discontinued Actos and started Januvia He thinks he feels better overall. Less nausea. No edema issues. Blood sugar stable with fasting 111 this morning. No hypoglycemia  We referred recently for left hand lesion. This did prove to be squamous cell carcinoma. This was excised. He now has some scaly thickened areas near the region where this was excised.  Past Medical History  Diagnosis Date  . CHF (congestive heart failure)   . Diabetes mellitus   . Hypertension   . S/P CABG (coronary artery bypass graft)   . S/P laparoscopic cholecystectomy   . Hernia     hernia repair x 3  . Other and unspecified diseases of appendix   . Pancreatitis   . Prostate ca 03/01/04    prostate bx=Adenocarcinoam,gleason 3+4=7,PSA=6.75  . Incontinence     hx  over 1 year,leaks without awareness  . Myocardial infarction   . Arthritis   . GERD (gastroesophageal reflux disease)   . Ulcer     hx gastric  . Hypercholesterolemia   . Heart murmur   . Chronic kidney disease     nephrolithiasis  . Cataract     surgery,B/L  . Allergy    Past Surgical History  Procedure Laterality Date  . Appendectomy    . Hernia repair    . Carpal tunnel release  2004    both hands  . Laparoscopic cholecystectomy  09/2009  . Incision and drainage of right chest abscess    . Coronary artery bypass graft    . Robot assisted laparoscopic radical prostatectomy  2005  . Back surgery    . Rectal surgery    . Cystoscopy  08/23/12    incomplete emoptying bladder    reports that he quit smoking about 40 years ago. His smoking use included Cigarettes. He has a 2.5 pack-year smoking history. He has never used smokeless tobacco. He reports that he does not drink alcohol or use illicit  drugs. family history includes Cancer in his mother. Allergies  Allergen Reactions  . Codeine   . Morphine     REACTION: does not like     Review of Systems  Constitutional: Negative for fatigue.  Eyes: Negative for visual disturbance.  Respiratory: Negative for cough, chest tightness and shortness of breath.   Cardiovascular: Negative for chest pain, palpitations and leg swelling.  Neurological: Negative for dizziness, syncope, weakness, light-headedness and headaches.       Objective:   Physical Exam  Constitutional: He appears well-developed and well-nourished.  Cardiovascular: Normal rate and regular rhythm.   Pulmonary/Chest: Effort normal and breath sounds normal. No respiratory distress. He has no wheezes. He has no rales.  Skin:  Feet reveal no skin lesions. Good distal foot pulses. Good capillary refill. No calluses. Normal sensation with monofilament testing  Left hand reveals relatively flat hyperkeratotic area about one half to one half centimeter dorsum left hand. No nodules           Assessment & Plan:  #1 type 2 diabetes. History of good control. Recent change of medications as above. Recheck A1c today #2 probable early actinic keratoses left hand. We recommend treatment with liquid nitrogen and patient consents. Treated without difficulty

## 2013-06-01 ENCOUNTER — Telehealth: Payer: Self-pay | Admitting: Family Medicine

## 2013-06-01 MED ORDER — POTASSIUM CHLORIDE CRYS ER 10 MEQ PO TBCR: EXTENDED_RELEASE_TABLET | ORAL | Status: DC

## 2013-06-01 NOTE — Telephone Encounter (Signed)
Pt needs refill of KLOR-CON M10 10 MEQ tablet 1/day Pt is out and they are going out of town this weekend. [facility] [address]

## 2013-06-01 NOTE — Telephone Encounter (Signed)
Sent to pharmacy 

## 2013-08-16 ENCOUNTER — Other Ambulatory Visit: Payer: Self-pay | Admitting: Family Medicine

## 2013-08-17 ENCOUNTER — Other Ambulatory Visit: Payer: Self-pay | Admitting: Cardiology

## 2013-08-17 ENCOUNTER — Other Ambulatory Visit: Payer: Self-pay | Admitting: Family Medicine

## 2013-09-12 ENCOUNTER — Other Ambulatory Visit: Payer: Self-pay | Admitting: Family Medicine

## 2013-09-19 ENCOUNTER — Other Ambulatory Visit: Payer: Self-pay | Admitting: Cardiology

## 2013-10-12 ENCOUNTER — Other Ambulatory Visit: Payer: Self-pay | Admitting: Cardiology

## 2013-10-14 ENCOUNTER — Other Ambulatory Visit: Payer: Self-pay | Admitting: Cardiology

## 2013-10-16 ENCOUNTER — Encounter (INDEPENDENT_AMBULATORY_CARE_PROVIDER_SITE_OTHER): Payer: Self-pay

## 2013-10-16 ENCOUNTER — Encounter: Payer: Self-pay | Admitting: Cardiovascular Disease

## 2013-10-16 ENCOUNTER — Encounter: Payer: Self-pay | Admitting: Cardiology

## 2013-10-16 ENCOUNTER — Ambulatory Visit (INDEPENDENT_AMBULATORY_CARE_PROVIDER_SITE_OTHER): Payer: Medicare PPO | Admitting: Cardiology

## 2013-10-16 ENCOUNTER — Ambulatory Visit (HOSPITAL_COMMUNITY): Payer: Medicare PPO | Attending: Cardiovascular Disease

## 2013-10-16 VITALS — BP 153/73 | HR 68 | Ht 69.0 in | Wt 188.1 lb

## 2013-10-16 DIAGNOSIS — R5383 Other fatigue: Secondary | ICD-10-CM

## 2013-10-16 DIAGNOSIS — I2510 Atherosclerotic heart disease of native coronary artery without angina pectoris: Secondary | ICD-10-CM

## 2013-10-16 DIAGNOSIS — R5381 Other malaise: Secondary | ICD-10-CM

## 2013-10-16 DIAGNOSIS — Z951 Presence of aortocoronary bypass graft: Secondary | ICD-10-CM | POA: Insufficient documentation

## 2013-10-16 DIAGNOSIS — I6529 Occlusion and stenosis of unspecified carotid artery: Secondary | ICD-10-CM | POA: Insufficient documentation

## 2013-10-16 DIAGNOSIS — I10 Essential (primary) hypertension: Secondary | ICD-10-CM | POA: Insufficient documentation

## 2013-10-16 DIAGNOSIS — R42 Dizziness and giddiness: Secondary | ICD-10-CM | POA: Insufficient documentation

## 2013-10-16 DIAGNOSIS — E785 Hyperlipidemia, unspecified: Secondary | ICD-10-CM | POA: Insufficient documentation

## 2013-10-16 DIAGNOSIS — I658 Occlusion and stenosis of other precerebral arteries: Secondary | ICD-10-CM | POA: Insufficient documentation

## 2013-10-16 DIAGNOSIS — R0602 Shortness of breath: Secondary | ICD-10-CM

## 2013-10-16 DIAGNOSIS — E119 Type 2 diabetes mellitus without complications: Secondary | ICD-10-CM | POA: Insufficient documentation

## 2013-10-16 LAB — CBC
HCT: 39.4 % (ref 39.0–52.0)
MCHC: 33.5 g/dL (ref 30.0–36.0)
MCV: 92.1 fl (ref 78.0–100.0)
Platelets: 180 10*3/uL (ref 150.0–400.0)
RDW: 13.5 % (ref 11.5–14.6)

## 2013-10-16 LAB — BRAIN NATRIURETIC PEPTIDE: Pro B Natriuretic peptide (BNP): 101 pg/mL — ABNORMAL HIGH (ref 0.0–100.0)

## 2013-10-16 LAB — TSH: TSH: 2.44 u[IU]/mL (ref 0.35–5.50)

## 2013-10-16 NOTE — Progress Notes (Signed)
HPI The patient presents for followup of CAD.   He has more somatic complaints.  He complains of dizziness. He says he loses his balance and his actually fallen.  He seems to describe some orthostatic symptoms. He had shortness of breath walking 100 feet.  She's not describing PND or orthopnea. He has been having chest discomfort. He says this happens when he uses his left arm in particular. Does get discomfort that goes up into his jaw and down into his left arm. It lasts for about 2 hours at a time and then goes away spontaneously. He has been nauseated or "sickish" feeling frequently. His wife says he's not eating well. He's not had any frank syncope. He's not describing classic PND or orthopnea. He says his fatigued with any activity.  Allergies  Allergen Reactions  . Codeine   . Morphine     REACTION: does not like    Current Outpatient Prescriptions  Medication Sig Dispense Refill  . ALPRAZolam (XANAX) 1 MG tablet Take 1 tablet (1 mg total) by mouth 3 (three) times daily as needed.  90 tablet  3  . amLODipine (NORVASC) 10 MG tablet TAKE 1 TABLET EVERY DAY  30 tablet  3  . aspirin 81 MG tablet Take 81 mg by mouth daily.        Marland Kitchen atorvastatin (LIPITOR) 80 MG tablet TAKE 1/2 TABLET ONCE DAILY  15 tablet  10  . benazepril (LOTENSIN) 40 MG tablet TAKE 1/2 TABLET BY MOUTH TWICE DAILY  30 tablet  1  . carvedilol (COREG) 6.25 MG tablet TAKE 1 TABLET BY MOUTH TWICE A DAY WITH A MEAL  60 tablet  2  . clopidogrel (PLAVIX) 75 MG tablet TAKE 1 TABLET (75 MG TOTAL) BY MOUTH DAILY.  30 tablet  11  . FLUZONE HIGH-DOSE injection       . furosemide (LASIX) 40 MG tablet TAKE 1 TABLET (40 MG TOTAL) BY MOUTH DAILY.  30 tablet  6  . glucose blood (FREESTYLE LITE) test strip 1 each by Other route as needed. Use as instructed       . isosorbide mononitrate (IMDUR) 60 MG 24 hr tablet TAKE 1 TABLET BY MOUTH DAILY.  90 tablet  3  . JANUVIA 50 MG tablet TAKE 1 TABLET BY MOUTH EVERY DAY  30 tablet  5  . Lancets  (FREESTYLE) lancets 1 each by Other route as needed. Use as instructed       . metFORMIN (GLUCOPHAGE) 500 MG tablet TAKE 1 TABLET BY MOUTH TWICE A DAY WITH MEALS  60 tablet  10  . METRONIDAZOLE, TOPICAL, (METROLOTION) 0.75 % LOTN Apply 1 application topically daily.  1 Bottle  11  . NITROSTAT 0.4 MG SL tablet TAKE AS DIRECTED  25 tablet  1  . pantoprazole (PROTONIX) 40 MG tablet TAKE 1 TABLET BY MOUTH EVERY DAY  30 tablet  11  . potassium chloride (KLOR-CON M10) 10 MEQ tablet TAKE AS DIRECTED WITH LASIX AS NEEDED  30 tablet  6  . spironolactone (ALDACTONE) 25 MG tablet TAKE 1 TABLET BY MOUTH EVERY DAY  30 tablet  4  . traMADol (ULTRAM) 50 MG tablet Take 1 tablet (50 mg total) by mouth every 6 (six) hours as needed. 1-2 every 6 h prn pain  120 tablet  5  . triamcinolone cream (KENALOG) 0.1 % Apply topically as needed.      . beta carotene w/minerals (OCUVITE) tablet Take 1 tablet by mouth daily.  No current facility-administered medications for this visit.    Past Medical History  Diagnosis Date  . CHF (congestive heart failure)   . Diabetes mellitus   . Hypertension   . S/P CABG (coronary artery bypass graft)   . S/P laparoscopic cholecystectomy   . Hernia     hernia repair x 3  . Other and unspecified diseases of appendix   . Pancreatitis   . Prostate ca 03/01/04    prostate bx=Adenocarcinoam,gleason 3+4=7,PSA=6.75  . Incontinence     hx  over 1 year,leaks without awareness  . Myocardial infarction   . Arthritis   . GERD (gastroesophageal reflux disease)   . Ulcer     hx gastric  . Hypercholesterolemia   . Heart murmur   . Chronic kidney disease     nephrolithiasis  . Cataract     surgery,B/L  . Allergy     Past Surgical History  Procedure Laterality Date  . Appendectomy    . Hernia repair    . Carpal tunnel release  2004    both hands  . Laparoscopic cholecystectomy  09/2009  . Incision and drainage of right chest abscess    . Coronary artery bypass graft      . Robot assisted laparoscopic radical prostatectomy  2005  . Back surgery    . Rectal surgery    . Cystoscopy  08/23/12    incomplete emoptying bladder    ROS:  Positive for cold intolerance. Otherwise as stated in the HPI and negative for all other systems.  PHYSICAL EXAM BP 133/79  Pulse 56  Ht 5\' 9"  (1.753 m)  Wt 188 lb 1.9 oz (85.331 kg)  BMI 27.77 kg/m2 GENERAL:  Well appearing HEENT:  Pupils equal round and reactive, fundi not visualized, oral mucosa unremarkable NECK:  No jugular venous distention, waveform within normal limits, carotid upstroke brisk and symmetric, no bruits, no thyromegaly LUNGS:  Clear to auscultation bilaterally BACK:  No CVA tenderness CHEST:  Well healed sternotomy scar. HEART:  PMI not displaced or sustained,S1 and S2 within normal limits, no S3, no S4, no clicks, no rubs, apical systolic early peaking radiating slightly to the axilla murmur ABD:  Flat, positive bowel sounds normal in frequency in pitch, no bruits, no rebound, no guarding, no midline pulsatile mass, no hepatomegaly, no splenomegaly EXT:  2 plus pulses throughout, no edema, no cyanosis no clubbing   ASSESSMENT AND PLAN   CORONARY ARTERY DISEASE  Given his chest discomfort he will need to be screened with another [employer]. He would not be a walk on a treadmill.  DYSPNEA ON EXERTION  His BNP was previously was normal. The echo demonstrated no suggestion of a cardiac etiology. She had only some mild aortic stenosis. I will check another BNP level. This has been a chronic complaint.  FATIGUE  This has been a chronic complaint. However, I will check a CBC and a TSH.  Hypertension  The blood pressure is at target. No change in medications is indicated. We will continue with therapeutic lifestyle changes (TLC).  Carotid stenosis-Bilateral  The patient now has bilateral 60-79% stenosis. He have this repeated today and results are pending.  Bradycardia This has been evaluated  in the past and there were no sustained or symptomatic bradycardia arrhythmias.  DIZZINESS:  The etiology of this is not clear. He was not orthostatic in the office today.

## 2013-10-16 NOTE — Patient Instructions (Addendum)
The current medical regimen is effective;  continue present plan and medications.  Please have blood work today (TSH,CBC and BNP)  Your physician has requested that you have a lexiscan myoview. For further information please visit [email or web address]. Please follow instruction sheet, as given.  Follow up in 6 months with Dr Percival Spanish.  You will receive a letter in the mail 2 months before you are due.  Please call us when you receive this letter to schedule your follow up appointment.

## 2013-10-26 ENCOUNTER — Telehealth: Payer: Self-pay | Admitting: Cardiology

## 2013-10-26 NOTE — Telephone Encounter (Signed)
Pt aware of results results and when to follow up.

## 2013-10-26 NOTE — Telephone Encounter (Signed)
New Prob    Pts wife calling to follow up on test results for pt. Please call.

## 2013-10-27 ENCOUNTER — Other Ambulatory Visit: Payer: Self-pay | Admitting: Cardiology

## 2013-10-31 ENCOUNTER — Encounter: Payer: Self-pay | Admitting: Cardiovascular Disease

## 2013-10-31 ENCOUNTER — Ambulatory Visit (HOSPITAL_COMMUNITY): Payer: Medicare PPO | Attending: Cardiovascular Disease | Admitting: Radiology

## 2013-10-31 VITALS — BP 140/67 | Ht 69.0 in | Wt 188.0 lb

## 2013-10-31 DIAGNOSIS — I2510 Atherosclerotic heart disease of native coronary artery without angina pectoris: Secondary | ICD-10-CM

## 2013-10-31 DIAGNOSIS — R079 Chest pain, unspecified: Secondary | ICD-10-CM | POA: Insufficient documentation

## 2013-10-31 DIAGNOSIS — R0609 Other forms of dyspnea: Secondary | ICD-10-CM | POA: Insufficient documentation

## 2013-10-31 DIAGNOSIS — Z87891 Personal history of nicotine dependence: Secondary | ICD-10-CM | POA: Insufficient documentation

## 2013-10-31 DIAGNOSIS — R42 Dizziness and giddiness: Secondary | ICD-10-CM | POA: Insufficient documentation

## 2013-10-31 DIAGNOSIS — I252 Old myocardial infarction: Secondary | ICD-10-CM | POA: Insufficient documentation

## 2013-10-31 DIAGNOSIS — I10 Essential (primary) hypertension: Secondary | ICD-10-CM | POA: Insufficient documentation

## 2013-10-31 DIAGNOSIS — E785 Hyperlipidemia, unspecified: Secondary | ICD-10-CM | POA: Insufficient documentation

## 2013-10-31 DIAGNOSIS — I779 Disorder of arteries and arterioles, unspecified: Secondary | ICD-10-CM | POA: Insufficient documentation

## 2013-10-31 DIAGNOSIS — E119 Type 2 diabetes mellitus without complications: Secondary | ICD-10-CM | POA: Insufficient documentation

## 2013-10-31 DIAGNOSIS — Z951 Presence of aortocoronary bypass graft: Secondary | ICD-10-CM | POA: Insufficient documentation

## 2013-10-31 DIAGNOSIS — I447 Left bundle-branch block, unspecified: Secondary | ICD-10-CM

## 2013-10-31 DIAGNOSIS — R0989 Other specified symptoms and signs involving the circulatory and respiratory systems: Secondary | ICD-10-CM | POA: Insufficient documentation

## 2013-10-31 MED ORDER — ADENOSINE (DIAGNOSTIC) 3 MG/ML IV SOLN
0.5600 mg/kg | Freq: Once | INTRAVENOUS | Status: AC
  Administered 2013-10-31: 47.7 mg via INTRAVENOUS

## 2013-10-31 MED ORDER — TECHNETIUM TC 99M SESTAMIBI GENERIC - CARDIOLITE
10.0000 | Freq: Once | INTRAVENOUS | Status: AC | PRN
  Administered 2013-10-31: 10 via INTRAVENOUS

## 2013-10-31 MED ORDER — TECHNETIUM TC 99M SESTAMIBI GENERIC - CARDIOLITE
30.0000 | Freq: Once | INTRAVENOUS | Status: AC | PRN
  Administered 2013-10-31: 30 via INTRAVENOUS

## 2013-10-31 NOTE — Progress Notes (Signed)
[facility] [address] [place], [place] [address] [phone]    Cardiology Nuclear Med Study  [name] is a 80 y.o. male     MRN : [id number]     DOB: February 05, 1933  Procedure Date: 10/31/2013  Nuclear Med Background Indication for Stress Test:  Evaluation for Ischemia, Graft Patency and Stent Patency History:  2009 Stent-SVG-OMI,-CABG, '10 MI-Cath, 4/12 MPI: inf wall diaphragmatic attenuation EF; 54%, 12/13 ECHO: EF: 55% Cardiac Risk Factors: Carotid Disease, History of Smoking, Hypertension, LBBB, Lipids and NIDDM  Symptoms:  Chest Pain, Dizziness and DOE   Nuclear Pre-Procedure Caffeine/Decaff Intake:  None > 12 hrs NPO After: 7:00pm   Lungs:  clear O2 Sat: 98% on room air. IV 0.9% NS with Angio Cath:  22g  IV Site: R Wrist x 1, tolerated well IV Started by:  Irven Baltimore, RN  Chest Size (in):  42 Cup Size: n/a  Height: 5\' 9"  (1.753 m)  Weight:  188 lb (85.276 kg)  BMI:  Body mass index is 27.75 kg/(m^2). Tech Comments:  No medications this am except took coreg per patient.    Nuclear Med Study 1 or 2 day study: 1 day  Stress Test Type:  Adenosine  Reading MD: N/A  Order Authorizing Provider:  [name], MD  Resting Radionuclide: Technetium 3m Sestamibi  Resting Radionuclide Dose: 11.0 mCi   Stress Radionuclide:  Technetium 105m Sestamibi  Stress Radionuclide Dose: 33.0 mCi           Stress Protocol Rest HR: 54 Stress HR: 71  Rest BP: 140/67 Stress BP: 143/64  Exercise Time (min): n/a METS: n/a   Predicted Max HR: 142 bpm % Max HR: 50 bpm Rate Pressure Product: 10153   Dose of Adenosine (mg):  47.9 Dose of Lexiscan: n/a mg  Dose of Atropine (mg): n/a Dose of Dobutamine: n/a mcg/kg/min (at max HR)  Stress Test Technologist: Perrin Maltese, EMT-P  Nuclear Technologist:  Vedia Pereyra, CNMT     Rest Procedure:  Myocardial perfusion imaging was performed at rest 45 minutes following the intravenous administration of  Technetium 43m Sestamibi. Rest ECG: NSR-LBBB  Stress Procedure:  The patient received IV adenosine at 140 mcg/kg/min for 4 minutes. This patient had sob, chest tightness, and abdominal pain with the Adenosine infusion Technetium 11m Sestamibi was injected at the 2 minute mark and quantitative spect images were obtained after a 45 minute delay. Stress ECG: No significant change from baseline ECG  QPS Raw Data Images:  Mild diaphragmatic attenuation.  Normal left ventricular size. Stress Images:  There is decreased uptake in the inferior wall. Rest Images:  Comparison with the stress images reveals no significant change. Subtraction (SDS):  No evidence of ischemia. Transient Ischemic Dilatation (Normal <1.22):  1.11 Lung/Heart Ratio (Normal <0.45):  0.31  Quantitative Gated Spect Images QGS EDV:  123 ml QGS ESV:  55 ml  Impression Exercise Capacity:  Lexiscan with no exercise. BP Response:  Normal blood pressure response. Clinical Symptoms:  No significant symptoms noted. ECG Impression:  Baseline:  LBBB.  EKG uninterpretable due to LBBB at rest and stress. Comparison with Prior Nuclear Study: No significant change from previous study  Overall Impression:  Low risk stress nuclear study with inferior diaphragmatic attenuation artifact. Cannot exclude a component of inferior wall scar, but ischemia is not observed.  LV Ejection Fraction: 55%.  LV Wall Motion:  Normal overall systolic function. Mild inferoseptal hypokinesis. Postoperative paradoxical septal motion is seen.  Sanda Klein, MD, [facility] [phone] office [phone] pager

## 2013-11-28 ENCOUNTER — Other Ambulatory Visit: Payer: Self-pay | Admitting: Cardiology

## 2013-12-03 ENCOUNTER — Other Ambulatory Visit: Payer: Self-pay

## 2013-12-03 ENCOUNTER — Encounter: Payer: Self-pay | Admitting: Family Medicine

## 2013-12-03 ENCOUNTER — Ambulatory Visit (INDEPENDENT_AMBULATORY_CARE_PROVIDER_SITE_OTHER): Payer: Medicare PPO | Admitting: Family Medicine

## 2013-12-03 VITALS — BP 126/74 | HR 56 | Temp 97.7°F | Wt 193.0 lb

## 2013-12-03 DIAGNOSIS — N183 Chronic kidney disease, stage 3 unspecified: Secondary | ICD-10-CM

## 2013-12-03 DIAGNOSIS — I10 Essential (primary) hypertension: Secondary | ICD-10-CM

## 2013-12-03 DIAGNOSIS — E119 Type 2 diabetes mellitus without complications: Secondary | ICD-10-CM

## 2013-12-03 DIAGNOSIS — N184 Chronic kidney disease, stage 4 (severe): Secondary | ICD-10-CM | POA: Insufficient documentation

## 2013-12-03 LAB — BASIC METABOLIC PANEL
BUN: 24 mg/dL — ABNORMAL HIGH (ref 6–23)
CO2: 25 meq/L (ref 19–32)
Calcium: 9.5 mg/dL (ref 8.4–10.5)
Chloride: 104 mEq/L (ref 96–112)
Creatinine, Ser: 1.9 mg/dL — ABNORMAL HIGH (ref 0.4–1.5)
GFR: 36.33 mL/min — ABNORMAL LOW (ref >60.00)
GLUCOSE: 111 mg/dL — AB (ref 70–99)
Potassium: 4.2 mEq/L (ref 3.5–5.1)
Sodium: 138 mEq/L (ref 135–145)

## 2013-12-03 LAB — HM DIABETES FOOT EXAM: HM Diabetic Foot Exam: NORMAL

## 2013-12-03 LAB — HEMOGLOBIN A1C: Hgb A1c MFr Bld: 5.8 % (ref 4.6–6.5)

## 2013-12-03 MED ORDER — ALPRAZOLAM 1 MG PO TABS: 1.0000 mg | ORAL_TABLET | Freq: Three times a day (TID) | ORAL | Status: DC | PRN

## 2013-12-03 MED ORDER — ISOSORBIDE MONONITRATE ER 60 MG PO TB24: ORAL_TABLET | ORAL | Status: DC

## 2013-12-03 MED ORDER — CARVEDILOL 6.25 MG PO TABS: 6.2500 mg | ORAL_TABLET | Freq: Two times a day (BID) | ORAL | Status: DC

## 2013-12-03 MED ORDER — AMLODIPINE BESYLATE 10 MG PO TABS: ORAL_TABLET | ORAL | Status: DC

## 2013-12-03 MED ORDER — POTASSIUM CHLORIDE CRYS ER 10 MEQ PO TBCR: EXTENDED_RELEASE_TABLET | ORAL | Status: DC

## 2013-12-03 MED ORDER — PANTOPRAZOLE SODIUM 40 MG PO TBEC: DELAYED_RELEASE_TABLET | ORAL | Status: DC

## 2013-12-03 NOTE — Progress Notes (Signed)
Pre visit review using our clinic review tool, if applicable. No additional management support is needed unless otherwise documented below in the visit note. 

## 2013-12-03 NOTE — Patient Instructions (Signed)
Diabetes and Foot Care Diabetes may cause you to have problems because of poor blood supply (circulation) to your feet and legs. This may cause the skin on your feet to become thinner, break easier, and heal more slowly. Your skin may become dry, and the skin may peel and crack. You may also have nerve damage in your legs and feet causing decreased feeling in them. You may not notice minor injuries to your feet that could lead to infections or more serious problems. Taking care of your feet is one of the most important things you can do for yourself.  HOME CARE INSTRUCTIONS  Wear shoes at all times, even in the house. Do not go barefoot. Bare feet are easily injured.  Check your feet daily for blisters, cuts, and redness. If you cannot see the bottom of your feet, use a mirror or ask someone for help.  Wash your feet with warm water (do not use hot water) and mild soap. Then pat your feet and the areas between your toes until they are completely dry. Do not soak your feet as this can dry your skin.  Apply a moisturizing lotion or petroleum jelly (that does not contain alcohol and is unscented) to the skin on your feet and to dry, brittle toenails. Do not apply lotion between your toes.  Trim your toenails straight across. Do not dig under them or around the cuticle. File the edges of your nails with an emery board or nail file.  Do not cut corns or calluses or try to remove them with medicine.  Wear clean socks or stockings every day. Make sure they are not too tight. Do not wear knee-high stockings since they may decrease blood flow to your legs.  Wear shoes that fit properly and have enough cushioning. To break in new shoes, wear them for just a few hours a day. This prevents you from injuring your feet. Always look in your shoes before you put them on to be sure there are no objects inside.  Do not cross your legs. This may decrease the blood flow to your feet.  If you find a minor scrape,  cut, or break in the skin on your feet, keep it and the skin around it clean and dry. These areas may be cleansed with mild soap and water. Do not cleanse the area with peroxide, alcohol, or iodine.  When you remove an adhesive bandage, be sure not to damage the skin around it.  If you have a wound, look at it several times a day to make sure it is healing.  Do not use heating pads or hot water bottles. They may burn your skin. If you have lost feeling in your feet or legs, you may not know it is happening until it is too late.  Make sure your health care provider performs a complete foot exam at least annually or more often if you have foot problems. Report any cuts, sores, or bruises to your health care provider immediately. SEEK MEDICAL CARE IF:   You have an injury that is not healing.  You have cuts or breaks in the skin.  You have an ingrown nail.  You notice redness on your legs or feet.  You feel burning or tingling in your legs or feet.  You have pain or cramps in your legs and feet.  Your legs or feet are numb.  Your feet always feel cold. SEEK IMMEDIATE MEDICAL CARE IF:   There is increasing redness,   swelling, or pain in or around a wound.  There is a red line that goes up your leg.  Pus is coming from a wound.  You develop a fever or as directed by your health care provider.  You notice a bad smell coming from an ulcer or wound. Document Released: 10/01/2000 Document Revised: 06/06/2013 Document Reviewed: 03/13/2013 Exit[employer] Patient Information 2014 [employer].  

## 2013-12-03 NOTE — Progress Notes (Signed)
Subjective:    Patient ID: [name], male    DOB: Dec 23, 1932, 80 y.o.   MRN: [id number]  HPI Patient seen for medical follow up. Multiple chronic medical problems as outlined below. He has occasional fleeting chest pains but had recent nuclear stress test which did not show any ischemia. Overall stable. Diabetes is not checked very often but usually low 100s fasting blood sugars. A1c's have been consistently well controlled. Last eye exam last May.  Blood pressures been stable. No orthostasis. Patient requesting refills carvedilol. No recent orthopnea.  History of chronic anxiety. He has for several years been on alprazolam and usually takes one per day. No recent falls.  History of prostate cancer followed by urology. He's had some recent urinary urgency and was given samples of VESIcare.  Past Medical History  Diagnosis Date  . CHF (congestive heart failure)   . Diabetes mellitus   . Hypertension   . S/P CABG (coronary artery bypass graft)   . S/P laparoscopic cholecystectomy   . Hernia     hernia repair x 3  . Other and unspecified diseases of appendix   . Pancreatitis   . Prostate ca 03/01/04    prostate bx=Adenocarcinoam,gleason 3+4=7,PSA=6.75  . Incontinence     hx  over 1 year,leaks without awareness  . Myocardial infarction   . Arthritis   . GERD (gastroesophageal reflux disease)   . Ulcer     hx gastric  . Hypercholesterolemia   . Heart murmur   . Chronic kidney disease     nephrolithiasis  . Cataract     surgery,B/L  . Allergy    Past Surgical History  Procedure Laterality Date  . Appendectomy    . Hernia repair    . Carpal tunnel release  2004    both hands  . Laparoscopic cholecystectomy  09/2009  . Incision and drainage of right chest abscess    . Coronary artery bypass graft    . Robot assisted laparoscopic radical prostatectomy  2005  . Back surgery    . Rectal surgery    . Cystoscopy  08/23/12    incomplete emoptying bladder    reports that  he quit smoking about 41 years ago. His smoking use included Cigarettes. He has a 2.5 pack-year smoking history. He has never used smokeless tobacco. He reports that he does not drink alcohol or use illicit drugs. family history includes Cancer in his mother. Allergies  Allergen Reactions  . Codeine   . Morphine     REACTION: does not like      Review of Systems  Constitutional: Positive for fatigue.  Eyes: Negative for visual disturbance.  Respiratory: Negative for cough, chest tightness and shortness of breath.   Cardiovascular: Negative for palpitations and leg swelling.  Gastrointestinal: Negative for abdominal pain.  Endocrine: Negative for polydipsia and polyuria.  Neurological: Negative for dizziness, syncope, weakness, light-headedness and headaches.       Objective:   Physical Exam  Constitutional: He is oriented to person, place, and time. He appears well-developed and well-nourished.  Neck: Neck supple. No thyromegaly present.  Cardiovascular: Normal rate.   Pulmonary/Chest: Effort normal and breath sounds normal. No respiratory distress. He has no wheezes. He has no rales.  Musculoskeletal: He exhibits no edema.  Neurological: He is alert and oriented to person, place, and time.          Assessment & Plan:  #1 type 2 diabetes. Well controlled. Recheck A1c. Continue yearly eye exam #  2 hypertension stable and at goal # 3 chronic kidney disease, stage III. Recheck basic metabolic panel #4 chronic anxiety. We've recommended sparing use of alprazolam. Refills given

## 2013-12-05 ENCOUNTER — Telehealth: Payer: Self-pay | Admitting: Family Medicine

## 2013-12-05 NOTE — Telephone Encounter (Signed)
Relevant patient education mailed to patient.  

## 2013-12-22 ENCOUNTER — Other Ambulatory Visit: Payer: Self-pay | Admitting: Cardiology

## 2014-02-25 ENCOUNTER — Other Ambulatory Visit: Payer: Self-pay | Admitting: Family Medicine

## 2014-02-27 ENCOUNTER — Other Ambulatory Visit: Payer: Self-pay | Admitting: Family Medicine

## 2014-04-03 ENCOUNTER — Other Ambulatory Visit (HOSPITAL_COMMUNITY): Payer: Self-pay | Admitting: Cardiology

## 2014-04-03 DIAGNOSIS — I6529 Occlusion and stenosis of unspecified carotid artery: Secondary | ICD-10-CM

## 2014-04-08 ENCOUNTER — Encounter: Payer: Self-pay | Admitting: Cardiology

## 2014-04-08 ENCOUNTER — Ambulatory Visit (INDEPENDENT_AMBULATORY_CARE_PROVIDER_SITE_OTHER): Payer: Medicare PPO | Admitting: Cardiology

## 2014-04-08 ENCOUNTER — Ambulatory Visit (HOSPITAL_COMMUNITY): Payer: Medicare PPO | Attending: Cardiology | Admitting: Cardiology

## 2014-04-08 VITALS — BP 134/62 | HR 52 | Ht 69.0 in | Wt 194.0 lb

## 2014-04-08 DIAGNOSIS — I2510 Atherosclerotic heart disease of native coronary artery without angina pectoris: Secondary | ICD-10-CM

## 2014-04-08 DIAGNOSIS — E785 Hyperlipidemia, unspecified: Secondary | ICD-10-CM | POA: Insufficient documentation

## 2014-04-08 DIAGNOSIS — Z951 Presence of aortocoronary bypass graft: Secondary | ICD-10-CM | POA: Insufficient documentation

## 2014-04-08 DIAGNOSIS — E119 Type 2 diabetes mellitus without complications: Secondary | ICD-10-CM | POA: Insufficient documentation

## 2014-04-08 DIAGNOSIS — I6529 Occlusion and stenosis of unspecified carotid artery: Secondary | ICD-10-CM | POA: Insufficient documentation

## 2014-04-08 DIAGNOSIS — I658 Occlusion and stenosis of other precerebral arteries: Secondary | ICD-10-CM | POA: Insufficient documentation

## 2014-04-08 DIAGNOSIS — I10 Essential (primary) hypertension: Secondary | ICD-10-CM | POA: Insufficient documentation

## 2014-04-08 NOTE — Patient Instructions (Signed)
Please hold Atorvastatin for 2 months to see if there is any improvement in your symptoms.  Please call to let Dr Percival Spanish know how you are doing in 2 months. [phone]). Continue all other medications as listed.  Follow up in 6 months with Dr Percival Spanish at the [facility] office.  You will receive a letter in the mail 2 months before you are due.  Please call us when you receive this letter to schedule your follow up appointment.

## 2014-04-08 NOTE — Progress Notes (Signed)
HPI The patient presents for followup of CAD.   At the last visit A BNP which was normal. This was to evaluate his dyspnea. He had a DEXA scan Myoview which was low risk. He continues to have symptoms unchanged from previous with some dyspnea. He has some atypical chest pain. He has fatigue is the biggest complaint. He also has diffuse muscle aches and joint pains or arthritis. He has seen [name], MD and had stable creatinine of 1.9. He has not had anemia. His lipids were excellent recently.  He has no classic symptoms of substernal chest pressure, neck or arm discomfort. He's not having any PND or orthopnea.  Allergies  Allergen Reactions  . Codeine   . Morphine     REACTION: does not like    Current Outpatient Prescriptions  Medication Sig Dispense Refill  . ALPRAZolam (XANAX) 1 MG tablet Take 1 tablet (1 mg total) by mouth 3 (three) times daily as needed.  90 tablet  3  . amLODipine (NORVASC) 10 MG tablet TAKE 1 TABLET EVERY DAY  30 tablet  11  . aspirin 81 MG tablet Take 81 mg by mouth daily.        Marland Kitchen atorvastatin (LIPITOR) 80 MG tablet TAKE 1/2 TABLET ONCE DAILY  15 tablet  10  . benazepril (LOTENSIN) 40 MG tablet TAKE 1/2 TABLET BY MOUTH TWICE A DAY  30 tablet  5  . beta carotene w/minerals (OCUVITE) tablet Take 1 tablet by mouth daily.      . carvedilol (COREG) 6.25 MG tablet Take 1 tablet by mouth daily.      . clopidogrel (PLAVIX) 75 MG tablet TAKE 1 TABLET (75 MG TOTAL) BY MOUTH DAILY.  30 tablet  5  . FLUZONE HIGH-DOSE injection       . furosemide (LASIX) 40 MG tablet TAKE 1 TABLET (40 MG TOTAL) BY MOUTH DAILY.  30 tablet  5  . glucose blood (FREESTYLE LITE) test strip 1 each by Other route as needed. Use as instructed       . isosorbide mononitrate (IMDUR) 60 MG 24 hr tablet TAKE 1 TABLET BY MOUTH DAILY.  90 tablet  3  . JANUVIA 50 MG tablet TAKE 1 TABLET BY MOUTH EVERY DAY  30 tablet  5  . Lancets (FREESTYLE) lancets 1 each by Other route as needed. Use as  instructed       . metFORMIN (GLUCOPHAGE) 500 MG tablet TAKE 1 TABLET BY MOUTH TWICE A DAY WITH MEALS  60 tablet  5  . METRONIDAZOLE, TOPICAL, (METROLOTION) 0.75 % LOTN Apply 1 application topically daily.  1 Bottle  11  . NITROSTAT 0.4 MG SL tablet TAKE AS DIRECTED  25 tablet  1  . pantoprazole (PROTONIX) 40 MG tablet TAKE 1 TABLET BY MOUTH EVERY DAY  30 tablet  11  . potassium chloride (KLOR-CON M10) 10 MEQ tablet TAKE AS DIRECTED WITH LASIX AS NEEDED  30 tablet  11  . spironolactone (ALDACTONE) 25 MG tablet TAKE 1 TABLET BY MOUTH EVERY DAY  30 tablet  4  . traMADol (ULTRAM) 50 MG tablet Take 1 tablet (50 mg total) by mouth every 6 (six) hours as needed. 1-2 every 6 h prn pain  120 tablet  5  . triamcinolone cream (KENALOG) 0.1 % Apply topically as needed.      . VESICARE 5 MG tablet Take 1 tablet by mouth daily.       No current facility-administered medications for this visit.  Past Medical History  Diagnosis Date  . CHF (congestive heart failure)   . Diabetes mellitus   . Hypertension   . S/P CABG (coronary artery bypass graft)   . S/P laparoscopic cholecystectomy   . Hernia     hernia repair x 3  . Other and unspecified diseases of appendix   . Pancreatitis   . Prostate ca 03/01/04    prostate bx=Adenocarcinoam,gleason 3+4=7,PSA=6.75  . Incontinence     hx  over 1 year,leaks without awareness  . Myocardial infarction   . Arthritis   . GERD (gastroesophageal reflux disease)   . Ulcer     hx gastric  . Hypercholesterolemia   . Heart murmur   . Chronic kidney disease     nephrolithiasis  . Cataract     surgery,B/L  . Allergy     Past Surgical History  Procedure Laterality Date  . Appendectomy    . Hernia repair    . Carpal tunnel release  2004    both hands  . Laparoscopic cholecystectomy  09/2009  . Incision and drainage of right chest abscess    . Coronary artery bypass graft    . Robot assisted laparoscopic radical prostatectomy  2005  . Back surgery    .  Rectal surgery    . Cystoscopy  08/23/12    incomplete emoptying bladder    ROS:  Positive for cold intolerance. Otherwise as stated in the HPI and negative for all other systems.  PHYSICAL EXAM BP 134/62  Pulse 52  Ht 5\' 9"  (1.753 m)  Wt 194 lb (87.998 kg)  BMI 28.64 kg/m2 GENERAL:  Well appearing HEENT:  Pupils equal round and reactive, fundi not visualized, oral mucosa unremarkable NECK:  No jugular venous distention, waveform within normal limits, carotid upstroke brisk and symmetric, no bruits, no thyromegaly LUNGS:  Clear to auscultation bilaterally BACK:  No CVA tenderness CHEST:  Well healed sternotomy scar. HEART:  PMI not displaced or sustained,S1 and S2 within normal limits, no S3, no S4, no clicks, no rubs, apical systolic early peaking radiating slightly to the axilla murmur ABD:  Flat, positive bowel sounds normal in frequency in pitch, no bruits, no rebound, no guarding, no midline pulsatile mass, no hepatomegaly, no splenomegaly EXT:  2 plus pulses throughout, no edema, no cyanosis no clubbing  EKG:   Sinus bradycardia, rate 52, right bundle branch block, left anterior fascicular block 04/08/2014  ASSESSMENT AND PLAN   CORONARY ARTERY DISEASE  He has had no new symptoms since the last Lexiscan Myoview.  He will continue with medical management.    DYSPNEA ON EXERTION  This is baseline his previous BNP was normal.  No further work up is indicated.   FATIGUE  This has been a chronic complaint. TSH and and CBC and other recent labs have been normal.  He wonders if this could be related to his statin since he also has joint pains. He will hold his atorvastatin for 2 months to see if this improves.  I would likely restart this med even if he has improvement to see if symptoms truly return and are related since he has many somatic complaints.   Hypertension  The blood pressure is at target. No change in medications is indicated. We will continue with therapeutic  lifestyle changes (TLC).  Carotid stenosis-Bilateral  The patient now has bilateral 60-79% stenosis. He is having this repeated today.   Bradycardia This has been evaluated in the past and there were no sustained  or symptomatic bradycardia arrhythmias.  DIZZINESS:  He has not been orthostatic.  No change in therapy is indicated.

## 2014-04-08 NOTE — Progress Notes (Signed)
Carotid duplex performed 

## 2014-04-12 ENCOUNTER — Other Ambulatory Visit: Payer: Self-pay

## 2014-04-12 NOTE — Telephone Encounter (Signed)
spironolactone (ALDACTONE) 25 MG tablet  TAKE 1 TABLET BY MOUTH EVERY DAY   30 tablet   4    Patient Instructions: The current medical regimen is effective;  continue present plan and medications.  Please have blood work today (TSH,CBC and BNP)  Your physician has requested that you have a lexiscan myoview. For further information please visit [email or web address]. Please follow instruction sheet, as given.  Follow up in 6 months with Dr Percival Spanish.  You will receive a letter in the mail 2 months before you are due.  Please call us when you receive this letter to schedule your follow up appointment. Patient Instructions History Recorded     Previous Visit  Provider Department Encounter #  10/16/2013 10:00 AM [name], MD [facility] [id number]

## 2014-04-15 MED ORDER — SPIRONOLACTONE 25 MG PO TABS: 12.5000 mg | ORAL_TABLET | Freq: Two times a day (BID) | ORAL | Status: DC

## 2014-05-08 ENCOUNTER — Telehealth: Payer: Self-pay | Admitting: Cardiology

## 2014-05-08 ENCOUNTER — Other Ambulatory Visit: Payer: Self-pay | Admitting: Family Medicine

## 2014-05-08 NOTE — Telephone Encounter (Signed)
Pt need his prescriptions called in today. Pt need his Furosemide 40 mg #30 and his Benazepril 40 mg #30. Please call to [phone].

## 2014-05-09 ENCOUNTER — Other Ambulatory Visit: Payer: Self-pay

## 2014-05-09 MED ORDER — BENAZEPRIL HCL 40 MG PO TABS: ORAL_TABLET | ORAL | Status: DC

## 2014-05-09 MED ORDER — FUROSEMIDE 40 MG PO TABS: ORAL_TABLET | ORAL | Status: DC

## 2014-05-24 ENCOUNTER — Other Ambulatory Visit: Payer: Self-pay | Admitting: Family Medicine

## 2014-06-03 ENCOUNTER — Ambulatory Visit (INDEPENDENT_AMBULATORY_CARE_PROVIDER_SITE_OTHER): Payer: Medicare PPO | Admitting: Family Medicine

## 2014-06-03 VITALS — BP 130/70 | HR 60 | Wt 188.0 lb

## 2014-06-03 DIAGNOSIS — Z23 Encounter for immunization: Secondary | ICD-10-CM

## 2014-06-03 DIAGNOSIS — I10 Essential (primary) hypertension: Secondary | ICD-10-CM

## 2014-06-03 DIAGNOSIS — N183 Chronic kidney disease, stage 3 unspecified: Secondary | ICD-10-CM

## 2014-06-03 DIAGNOSIS — R110 Nausea: Secondary | ICD-10-CM

## 2014-06-03 DIAGNOSIS — E119 Type 2 diabetes mellitus without complications: Secondary | ICD-10-CM

## 2014-06-03 DIAGNOSIS — L570 Actinic keratosis: Secondary | ICD-10-CM

## 2014-06-03 DIAGNOSIS — I2510 Atherosclerotic heart disease of native coronary artery without angina pectoris: Secondary | ICD-10-CM

## 2014-06-03 DIAGNOSIS — E785 Hyperlipidemia, unspecified: Secondary | ICD-10-CM

## 2014-06-03 LAB — BASIC METABOLIC PANEL
BUN: 46 mg/dL — AB (ref 6–23)
CO2: 22 meq/L (ref 19–32)
Calcium: 9.8 mg/dL (ref 8.4–10.5)
Chloride: 109 mEq/L (ref 96–112)
Creatinine, Ser: 2.8 mg/dL — ABNORMAL HIGH (ref 0.4–1.5)
GFR: 23.43 mL/min — ABNORMAL LOW (ref >60.00)
GLUCOSE: 94 mg/dL (ref 70–99)
Potassium: 5.3 mEq/L — ABNORMAL HIGH (ref 3.5–5.1)
Sodium: 138 mEq/L (ref 135–145)

## 2014-06-03 LAB — LIPID PANEL
CHOL/HDL RATIO: 7
Cholesterol: 156 mg/dL (ref 0–200)
HDL: 21.5 mg/dL — AB (ref >39.00)
LDL Cholesterol: 98 mg/dL (ref 0–99)
NONHDL: 134.5
Triglycerides: 183 mg/dL — ABNORMAL HIGH (ref 0.0–149.0)
VLDL: 36.6 mg/dL (ref 0.0–40.0)

## 2014-06-03 LAB — HEMOGLOBIN A1C: Hgb A1c MFr Bld: 5.8 % (ref 4.6–6.5)

## 2014-06-03 LAB — HEPATIC FUNCTION PANEL
ALBUMIN: 4.4 g/dL (ref 3.5–5.2)
ALK PHOS: 68 U/L (ref 39–117)
ALT: 11 U/L (ref 0–53)
AST: 11 U/L (ref 0–37)
Bilirubin, Direct: 0.1 mg/dL (ref 0.0–0.3)
Total Bilirubin: 0.9 mg/dL (ref 0.2–1.2)
Total Protein: 8.1 g/dL (ref 6.0–8.3)

## 2014-06-03 NOTE — Progress Notes (Signed)
Subjective:    Patient ID: [name], male    DOB: 04/23/1933, 80 y.o.   MRN: [id number]  HPI Medical followup. He has history of type 2 diabetes, CAD, peripheral vascular disease, hypertension, hyperlipidemia, chronic kidney disease, osteoarthritis, rosacea, history of prostate cancer.  Type 2 diabetes. History of good control. Blood sugars fairly consistently low 100 range. No symptoms of polyuria or polydipsia. On metformin and Januvia  .  Does have some CKD and may need to look at d/c metformin.  Dyslipidemia. He takes Lipitor. He has some chronic arthralgias which were unchanged after holding Lipitor several months ago. He has chronic complaints of weakness and fatigue. Previous TSH and CBC testing normal. He gets adequate sleep. No consistent exercise. No chest pains. No dizziness.  Patient complains of almost daily low grade nausea. No abdominal pain. No vomiting. Appetite and weight are stable. No stool changes. No clear precipitating factors-other than possibly worse after eating. No history of known gastroparesis  History of squamous cell cancer of the skin and actinic keratoses. Multiple scaly lesions on his face.  Past Medical History  Diagnosis Date  . CHF (congestive heart failure)   . Diabetes mellitus   . Hypertension   . S/P CABG (coronary artery bypass graft)   . S/P laparoscopic cholecystectomy   . Hernia     hernia repair x 3  . Other and unspecified diseases of appendix   . Pancreatitis   . Prostate ca 03/01/04    prostate bx=Adenocarcinoam,gleason 3+4=7,PSA=6.75  . Incontinence     hx  over 1 year,leaks without awareness  . Myocardial infarction   . Arthritis   . GERD (gastroesophageal reflux disease)   . Ulcer     hx gastric  . Hypercholesterolemia   . Heart murmur   . Chronic kidney disease     nephrolithiasis  . Cataract     surgery,B/L  . Allergy    Past Surgical History  Procedure Laterality Date  . Appendectomy    . Hernia repair    .  Carpal tunnel release  2004    both hands  . Laparoscopic cholecystectomy  09/2009  . Incision and drainage of right chest abscess    . Coronary artery bypass graft    . Robot assisted laparoscopic radical prostatectomy  2005  . Back surgery    . Rectal surgery    . Cystoscopy  08/23/12    incomplete emoptying bladder    reports that he quit smoking about 41 years ago. His smoking use included Cigarettes. He has a 2.5 pack-year smoking history. He has never used smokeless tobacco. He reports that he does not drink alcohol or use illicit drugs. family history includes Cancer in his mother. Allergies  Allergen Reactions  . Codeine   . Morphine     REACTION: does not like      Review of Systems  Constitutional: Negative for appetite change, fatigue and unexpected weight change.  Eyes: Negative for visual disturbance.  Respiratory: Negative for cough, chest tightness and shortness of breath.   Cardiovascular: Negative for chest pain, palpitations and leg swelling.  Endocrine: Negative for polydipsia and polyuria.  Neurological: Negative for dizziness, syncope, weakness, light-headedness and headaches.       Objective:   Physical Exam  Constitutional: He is oriented to person, place, and time. He appears well-developed and well-nourished.  HENT:  Right Ear: External ear normal.  Left Ear: External ear normal.  Mouth/Throat: Oropharynx is clear and moist.  Eyes: Pupils are equal, round, and reactive to light.  Neck: Neck supple. No thyromegaly present.  Cardiovascular: Normal rate and regular rhythm.   Pulmonary/Chest: Effort normal and breath sounds normal. No respiratory distress. He has no wheezes. He has no rales.  Abdominal:  Ventral hernia which is soft and nontender. No other masses palpated. No tenderness  Musculoskeletal: He exhibits no edema.  Neurological: He is alert and oriented to person, place, and time.          Assessment & Plan:  #1 type 2 diabetes.  History of good control. Recheck A1c #2 hyperlipidemia. Check lipid and hepatic panel  #3 chronic kidney disease. Check basic metabolic panel #4 multiple actinic keratosis - including dorsum right and left ear and right and left cheek region. Each of these treated with liquid nitrogen without difficulty after discussing risks and benefits and obtaining consent from patient. Continue good skin care protection #5 frequent postprandial nausea. Rule out gastroparesis. Set gastric imaging study.  He has had previous cholecystectomy.   #6 health maintenance. Prevnar 13 given. Reminder for yearly flu vaccine.

## 2014-06-03 NOTE — Progress Notes (Signed)
Pre visit review using our clinic review tool, if applicable. No additional management support is needed unless otherwise documented below in the visit note. 

## 2014-06-03 NOTE — Patient Instructions (Signed)
Remember yearly flu vaccine We will call you regarding gastric emptying study

## 2014-06-04 ENCOUNTER — Other Ambulatory Visit: Payer: Medicare PPO

## 2014-06-10 ENCOUNTER — Encounter (HOSPITAL_COMMUNITY)
Admission: RE | Admit: 2014-06-10 | Discharge: 2014-06-10 | Disposition: A | Discharge status: Home / Self Care | Payer: Medicare PPO | Source: Ambulatory Visit | Attending: Family Medicine | Admitting: Family Medicine

## 2014-06-10 DIAGNOSIS — R110 Nausea: Secondary | ICD-10-CM | POA: Diagnosis not present

## 2014-06-10 MED ORDER — TECHNETIUM TC 99M SULFUR COLLOID
2.1000 | Freq: Once | INTRAVENOUS | Status: AC | PRN
  Administered 2014-06-10: 2.1 via ORAL

## 2014-06-11 ENCOUNTER — Other Ambulatory Visit (INDEPENDENT_AMBULATORY_CARE_PROVIDER_SITE_OTHER): Payer: Medicare PPO

## 2014-06-11 DIAGNOSIS — E876 Hypokalemia: Secondary | ICD-10-CM

## 2014-06-11 LAB — BASIC METABOLIC PANEL
BUN: 42 mg/dL — ABNORMAL HIGH (ref 6–23)
CO2: 23 mEq/L (ref 19–32)
Calcium: 9.3 mg/dL (ref 8.4–10.5)
Chloride: 106 mEq/L (ref 96–112)
Creatinine, Ser: 2.7 mg/dL — ABNORMAL HIGH (ref 0.4–1.5)
GFR: 23.92 mL/min — ABNORMAL LOW (ref >60.00)
GLUCOSE: 105 mg/dL — AB (ref 70–99)
POTASSIUM: 4.8 meq/L (ref 3.5–5.1)
SODIUM: 136 meq/L (ref 135–145)

## 2014-06-17 ENCOUNTER — Encounter: Payer: Self-pay | Admitting: Family Medicine

## 2014-06-17 ENCOUNTER — Ambulatory Visit (INDEPENDENT_AMBULATORY_CARE_PROVIDER_SITE_OTHER): Payer: Medicare PPO | Admitting: Family Medicine

## 2014-06-17 VITALS — BP 130/70 | HR 60 | Temp 97.7°F | Wt 188.0 lb

## 2014-06-17 DIAGNOSIS — E119 Type 2 diabetes mellitus without complications: Secondary | ICD-10-CM

## 2014-06-17 DIAGNOSIS — N184 Chronic kidney disease, stage 4 (severe): Secondary | ICD-10-CM

## 2014-06-17 DIAGNOSIS — E785 Hyperlipidemia, unspecified: Secondary | ICD-10-CM

## 2014-06-17 DIAGNOSIS — I10 Essential (primary) hypertension: Secondary | ICD-10-CM

## 2014-06-17 MED ORDER — ROSUVASTATIN CALCIUM 20 MG PO TABS: 20.0000 mg | ORAL_TABLET | Freq: Every day | ORAL | Status: DC

## 2014-06-17 NOTE — Patient Instructions (Addendum)
Continue to hold Metformin and K-lor Continue to take Benazepril ONCE daily Reduce Furosemide to ONE HALF tablet daily HOLD Vesicare AVOID ALL NSAIDS We will call you with ultrasound. Hold Lipitor and start Crestor 20 mg once daily

## 2014-06-17 NOTE — Progress Notes (Signed)
Pre visit review using our clinic review tool, if applicable. No additional management support is needed unless otherwise documented below in the visit note. 

## 2014-06-17 NOTE — Progress Notes (Signed)
Subjective:    Patient ID: [name], male    DOB: 1933/09/17, 80 y.o.   MRN: [id number]  HPI Patient seen for medical followup. He has multiple chronic problems including history of ischemic cardiomyopathy, chronic kidney disease, 2 diabetes, hyperlipidemia, hypertension, rosacea, history of prostate cancer, and GERD with previous ulcer of esophagus. We recently performed some labs and he had worsening kidney function with GFR less than 25. This was confirmed with repeat. We had him hold metformin and reduce his benazepril to once daily. He was instructed to avoid all nonsteroidals.  He is not complaining of any increased dyspnea and no orthopnea. No peripheral edema issues recently.  Has hyperlipidemia and had been on Lipitor but was complaining of extreme myalgias and generally feeling weak and poorly all over. He was convinced this was secondary to Lipitor. He held Lipitor and symptoms resolved. He is currently not taking any statin medication.  Blood sugars have been stable since stopping metformin. Recent A1c 5.8%. He does not monitor sugars daily.  Past Medical History  Diagnosis Date  . CHF (congestive heart failure)   . Diabetes mellitus   . Hypertension   . S/P CABG (coronary artery bypass graft)   . S/P laparoscopic cholecystectomy   . Hernia     hernia repair x 3  . Other and unspecified diseases of appendix   . Pancreatitis   . Prostate ca 03/01/04    prostate bx=Adenocarcinoam,gleason 3+4=7,PSA=6.75  . Incontinence     hx  over 1 year,leaks without awareness  . Myocardial infarction   . Arthritis   . GERD (gastroesophageal reflux disease)   . Ulcer     hx gastric  . Hypercholesterolemia   . Heart murmur   . Chronic kidney disease     nephrolithiasis  . Cataract     surgery,B/L  . Allergy    Past Surgical History  Procedure Laterality Date  . Appendectomy    . Hernia repair    . Carpal tunnel release  2004    both hands  . Laparoscopic cholecystectomy   09/2009  . Incision and drainage of right chest abscess    . Coronary artery bypass graft    . Robot assisted laparoscopic radical prostatectomy  2005  . Back surgery    . Rectal surgery    . Cystoscopy  08/23/12    incomplete emoptying bladder    reports that he quit smoking about 41 years ago. His smoking use included Cigarettes. He has a 2.5 pack-year smoking history. He has never used smokeless tobacco. He reports that he does not drink alcohol or use illicit drugs. family history includes Cancer in his mother. Allergies  Allergen Reactions  . Codeine   . Morphine     REACTION: does not like      Review of Systems  Constitutional: Negative for fatigue.  Eyes: Negative for visual disturbance.  Respiratory: Negative for cough, chest tightness and shortness of breath.   Cardiovascular: Negative for chest pain, palpitations and leg swelling.  Endocrine: Negative for polydipsia and polyuria.  Neurological: Negative for dizziness, syncope, weakness, light-headedness and headaches.       Objective:   Physical Exam  Constitutional: He is oriented to person, place, and time. He appears well-developed and well-nourished.  HENT:  Right Ear: External ear normal.  Left Ear: External ear normal.  Mouth/Throat: Oropharynx is clear and moist.  Eyes: Pupils are equal, round, and reactive to light.  Neck: Neck supple. No thyromegaly present.  Cardiovascular: Normal rate and regular rhythm.   Pulmonary/Chest: Effort normal and breath sounds normal. No respiratory distress. He has no wheezes. He has no rales.  Musculoskeletal: He exhibits no edema.  Neurological: He is alert and oriented to person, place, and time.          Assessment & Plan:  #1 acute on chronic kidney failure. He has history of stage III chronic kidney disease and recent worsening with GFR 23.9. Continue to hold metformin. Reduce furosemide to one half tablet daily. Set up renal ultrasound. Set up nephrology  referral. Avoid all nonsteroidals. #2 type 2 diabetes. Stable. Hold metformin as above. Recent A1C < 6 so might not need additonal medications at this time. #3 hypertension which is currently stable #4 dyslipidemia. Currently not on a statin. Try Crestor 20 mg once daily. Possible intolerance to Lipitor

## 2014-07-01 ENCOUNTER — Other Ambulatory Visit (INDEPENDENT_AMBULATORY_CARE_PROVIDER_SITE_OTHER): Payer: Medicare PPO

## 2014-07-01 DIAGNOSIS — N184 Chronic kidney disease, stage 4 (severe): Secondary | ICD-10-CM

## 2014-07-01 LAB — BASIC METABOLIC PANEL
BUN: 30 mg/dL — AB (ref 6–23)
CO2: 23 meq/L (ref 19–32)
CREATININE: 2.2 mg/dL — AB (ref 0.4–1.5)
Calcium: 9.4 mg/dL (ref 8.4–10.5)
Chloride: 107 mEq/L (ref 96–112)
GFR: 30.5 mL/min — ABNORMAL LOW (ref >60.00)
Glucose, Bld: 96 mg/dL (ref 70–99)
POTASSIUM: 4.4 meq/L (ref 3.5–5.1)
Sodium: 140 mEq/L (ref 135–145)

## 2014-07-02 ENCOUNTER — Other Ambulatory Visit: Payer: Self-pay | Admitting: Family Medicine

## 2014-07-02 DIAGNOSIS — R7989 Other specified abnormal findings of blood chemistry: Secondary | ICD-10-CM

## 2014-07-08 ENCOUNTER — Ambulatory Visit
Admission: RE | Admit: 2014-07-08 | Discharge: 2014-07-08 | Disposition: A | Discharge status: Home / Self Care | Payer: Medicare PPO | Source: Ambulatory Visit | Attending: Family Medicine | Admitting: Family Medicine

## 2014-07-08 DIAGNOSIS — N184 Chronic kidney disease, stage 4 (severe): Secondary | ICD-10-CM

## 2014-07-16 ENCOUNTER — Telehealth: Payer: Self-pay | Admitting: *Deleted

## 2014-07-16 ENCOUNTER — Encounter (HOSPITAL_COMMUNITY): Payer: Self-pay | Admitting: Emergency Medicine

## 2014-07-16 ENCOUNTER — Emergency Department (HOSPITAL_COMMUNITY): Payer: Medicare PPO

## 2014-07-16 ENCOUNTER — Inpatient Hospital Stay (HOSPITAL_COMMUNITY)
Admission: EM | Admit: 2014-07-16 | Discharge: 2014-07-20 | DRG: 251 | Disposition: A | Discharge status: Home / Self Care | Payer: Medicare PPO | Attending: Interventional Cardiology | Admitting: Interventional Cardiology

## 2014-07-16 DIAGNOSIS — N184 Chronic kidney disease, stage 4 (severe): Secondary | ICD-10-CM

## 2014-07-16 DIAGNOSIS — I6529 Occlusion and stenosis of unspecified carotid artery: Secondary | ICD-10-CM | POA: Diagnosis present

## 2014-07-16 DIAGNOSIS — I255 Ischemic cardiomyopathy: Secondary | ICD-10-CM

## 2014-07-16 DIAGNOSIS — Z7982 Long term (current) use of aspirin: Secondary | ICD-10-CM

## 2014-07-16 DIAGNOSIS — I6523 Occlusion and stenosis of bilateral carotid arteries: Secondary | ICD-10-CM | POA: Diagnosis present

## 2014-07-16 DIAGNOSIS — Y831 Surgical operation with implant of artificial internal device as the cause of abnormal reaction of the patient, or of later complication, without mention of misadventure at the time of the procedure: Secondary | ICD-10-CM | POA: Diagnosis present

## 2014-07-16 DIAGNOSIS — Z951 Presence of aortocoronary bypass graft: Secondary | ICD-10-CM

## 2014-07-16 DIAGNOSIS — I129 Hypertensive chronic kidney disease with stage 1 through stage 4 chronic kidney disease, or unspecified chronic kidney disease: Secondary | ICD-10-CM | POA: Diagnosis present

## 2014-07-16 DIAGNOSIS — I252 Old myocardial infarction: Secondary | ICD-10-CM

## 2014-07-16 DIAGNOSIS — Z87891 Personal history of nicotine dependence: Secondary | ICD-10-CM

## 2014-07-16 DIAGNOSIS — N289 Disorder of kidney and ureter, unspecified: Secondary | ICD-10-CM

## 2014-07-16 DIAGNOSIS — I25110 Atherosclerotic heart disease of native coronary artery with unstable angina pectoris: Secondary | ICD-10-CM

## 2014-07-16 DIAGNOSIS — I200 Unstable angina: Secondary | ICD-10-CM

## 2014-07-16 DIAGNOSIS — K2211 Ulcer of esophagus with bleeding: Secondary | ICD-10-CM | POA: Diagnosis present

## 2014-07-16 DIAGNOSIS — I25710 Atherosclerosis of autologous vein coronary artery bypass graft(s) with unstable angina pectoris: Secondary | ICD-10-CM | POA: Diagnosis present

## 2014-07-16 DIAGNOSIS — R0609 Other forms of dyspnea: Secondary | ICD-10-CM

## 2014-07-16 DIAGNOSIS — E1121 Type 2 diabetes mellitus with diabetic nephropathy: Secondary | ICD-10-CM

## 2014-07-16 DIAGNOSIS — T82858A Stenosis of vascular prosthetic devices, implants and grafts, initial encounter: Principal | ICD-10-CM | POA: Diagnosis present

## 2014-07-16 DIAGNOSIS — Z955 Presence of coronary angioplasty implant and graft: Secondary | ICD-10-CM

## 2014-07-16 DIAGNOSIS — C61 Malignant neoplasm of prostate: Secondary | ICD-10-CM | POA: Diagnosis present

## 2014-07-16 DIAGNOSIS — I2582 Chronic total occlusion of coronary artery: Secondary | ICD-10-CM | POA: Diagnosis present

## 2014-07-16 DIAGNOSIS — I25810 Atherosclerosis of coronary artery bypass graft(s) without angina pectoris: Secondary | ICD-10-CM

## 2014-07-16 DIAGNOSIS — I2089 Other forms of angina pectoris: Secondary | ICD-10-CM

## 2014-07-16 DIAGNOSIS — K219 Gastro-esophageal reflux disease without esophagitis: Secondary | ICD-10-CM | POA: Diagnosis present

## 2014-07-16 DIAGNOSIS — I208 Other forms of angina pectoris: Secondary | ICD-10-CM

## 2014-07-16 DIAGNOSIS — Z79899 Other long term (current) drug therapy: Secondary | ICD-10-CM

## 2014-07-16 DIAGNOSIS — E785 Hyperlipidemia, unspecified: Secondary | ICD-10-CM

## 2014-07-16 DIAGNOSIS — I350 Nonrheumatic aortic (valve) stenosis: Secondary | ICD-10-CM

## 2014-07-16 DIAGNOSIS — E780 Pure hypercholesterolemia: Secondary | ICD-10-CM | POA: Diagnosis present

## 2014-07-16 DIAGNOSIS — Z8546 Personal history of malignant neoplasm of prostate: Secondary | ICD-10-CM

## 2014-07-16 DIAGNOSIS — I5189 Other ill-defined heart diseases: Secondary | ICD-10-CM

## 2014-07-16 DIAGNOSIS — R079 Chest pain, unspecified: Secondary | ICD-10-CM

## 2014-07-16 DIAGNOSIS — Z7902 Long term (current) use of antithrombotics/antiplatelets: Secondary | ICD-10-CM

## 2014-07-16 DIAGNOSIS — I209 Angina pectoris, unspecified: Secondary | ICD-10-CM

## 2014-07-16 DIAGNOSIS — R911 Solitary pulmonary nodule: Secondary | ICD-10-CM | POA: Diagnosis present

## 2014-07-16 DIAGNOSIS — I2510 Atherosclerotic heart disease of native coronary artery without angina pectoris: Secondary | ICD-10-CM

## 2014-07-16 DIAGNOSIS — I10 Essential (primary) hypertension: Secondary | ICD-10-CM

## 2014-07-16 DIAGNOSIS — I447 Left bundle-branch block, unspecified: Secondary | ICD-10-CM

## 2014-07-16 DIAGNOSIS — Z8711 Personal history of peptic ulcer disease: Secondary | ICD-10-CM

## 2014-07-16 DIAGNOSIS — N183 Chronic kidney disease, stage 3 (moderate): Secondary | ICD-10-CM | POA: Diagnosis present

## 2014-07-16 DIAGNOSIS — N189 Chronic kidney disease, unspecified: Secondary | ICD-10-CM

## 2014-07-16 LAB — GLUCOSE, CAPILLARY: GLUCOSE-CAPILLARY: 176 mg/dL — AB (ref 70–99)

## 2014-07-16 LAB — TROPONIN I

## 2014-07-16 LAB — PROTIME-INR
INR: 1.03 (ref 0.00–1.49)
INR: 1.04 (ref 0.00–1.49)
PROTHROMBIN TIME: 13.5 s (ref 11.6–15.2)
PROTHROMBIN TIME: 13.6 s (ref 11.6–15.2)

## 2014-07-16 LAB — I-STAT TROPONIN, ED
Troponin i, poc: 0.01 ng/mL (ref 0.00–0.08)
Troponin i, poc: 0.01 ng/mL (ref 0.00–0.08)

## 2014-07-16 LAB — BASIC METABOLIC PANEL
ANION GAP: 13 (ref 5–15)
BUN: 35 mg/dL — ABNORMAL HIGH (ref 6–23)
CHLORIDE: 104 meq/L (ref 96–112)
CO2: 23 meq/L (ref 19–32)
Calcium: 9.6 mg/dL (ref 8.4–10.5)
Creatinine, Ser: 2.2 mg/dL — ABNORMAL HIGH (ref 0.50–1.35)
GFR calc Af Amer: 31 mL/min — ABNORMAL LOW (ref >90)
GFR calc non Af Amer: 27 mL/min — ABNORMAL LOW (ref >90)
Glucose, Bld: 105 mg/dL — ABNORMAL HIGH (ref 70–99)
Potassium: 4.2 mEq/L (ref 3.7–5.3)
SODIUM: 140 meq/L (ref 137–147)

## 2014-07-16 LAB — CBG MONITORING, ED: Glucose-Capillary: 93 mg/dL (ref 70–99)

## 2014-07-16 LAB — CBC
HCT: 36 % — ABNORMAL LOW (ref 39.0–52.0)
Hemoglobin: 12.5 g/dL — ABNORMAL LOW (ref 13.0–17.0)
MCH: 30.8 pg (ref 26.0–34.0)
MCHC: 34.7 g/dL (ref 30.0–36.0)
MCV: 88.7 fL (ref 78.0–100.0)
Platelets: 151 10*3/uL (ref 150–400)
RBC: 4.06 MIL/uL — AB (ref 4.22–5.81)
RDW: 13 % (ref 11.5–15.5)
WBC: 8.8 10*3/uL (ref 4.0–10.5)

## 2014-07-16 LAB — MAGNESIUM: Magnesium: 1.9 mg/dL (ref 1.5–2.5)

## 2014-07-16 LAB — HEPARIN LEVEL (UNFRACTIONATED): Heparin Unfractionated: 0.42 IU/mL (ref 0.30–0.70)

## 2014-07-16 LAB — TSH: TSH: 4.59 u[IU]/mL — AB (ref 0.350–4.500)

## 2014-07-16 MED ORDER — ONDANSETRON HCL 4 MG/2ML IJ SOLN
4.0000 mg | Freq: Four times a day (QID) | INTRAMUSCULAR | Status: DC | PRN
  Administered 2014-07-19: 11:00:00 4 mg via INTRAVENOUS
  Filled 2014-07-16: qty 2

## 2014-07-16 MED ORDER — HEPARIN (PORCINE) IN NACL 100-0.45 UNIT/ML-% IJ SOLN
1100.0000 [IU]/h | INTRAMUSCULAR | Status: DC
  Administered 2014-07-16 – 2014-07-18 (×2): 1100 [IU]/h via INTRAVENOUS
  Filled 2014-07-16 (×6): qty 250

## 2014-07-16 MED ORDER — SODIUM CHLORIDE 0.9 % IV SOLN
INTRAVENOUS | Status: DC
  Administered 2014-07-16: 75 mL/h via INTRAVENOUS

## 2014-07-16 MED ORDER — ASPIRIN 300 MG RE SUPP: 300.0000 mg | RECTAL | Status: DC

## 2014-07-16 MED ORDER — CLOPIDOGREL BISULFATE 75 MG PO TABS
75.0000 mg | ORAL_TABLET | Freq: Every day | ORAL | Status: DC
  Administered 2014-07-17 – 2014-07-20 (×3): 75 mg via ORAL
  Filled 2014-07-16 (×3): qty 1

## 2014-07-16 MED ORDER — ASPIRIN EC 325 MG PO TBEC
325.0000 mg | DELAYED_RELEASE_TABLET | Freq: Once | ORAL | Status: AC
  Administered 2014-07-16: 325 mg via ORAL
  Filled 2014-07-16: qty 1

## 2014-07-16 MED ORDER — ATORVASTATIN CALCIUM 80 MG PO TABS
80.0000 mg | ORAL_TABLET | Freq: Every day | ORAL | Status: DC
  Administered 2014-07-16 – 2014-07-19 (×4): 80 mg via ORAL
  Filled 2014-07-16 (×5): qty 1

## 2014-07-16 MED ORDER — HEPARIN SODIUM (PORCINE) 5000 UNIT/ML IJ SOLN
4000.0000 [IU] | Freq: Once | INTRAMUSCULAR | Status: AC
  Administered 2014-07-16: 4000 [IU] via INTRAVENOUS
  Filled 2014-07-16: qty 1

## 2014-07-16 MED ORDER — SODIUM CHLORIDE 0.9 % IJ SOLN: 3.0000 mL | INTRAMUSCULAR | Status: DC | PRN

## 2014-07-16 MED ORDER — NITROGLYCERIN 0.4 MG SL SUBL
SUBLINGUAL_TABLET | SUBLINGUAL | Status: AC
  Administered 2014-07-16: 0.4 mg via SUBLINGUAL
  Filled 2014-07-16: qty 1

## 2014-07-16 MED ORDER — ASPIRIN EC 81 MG PO TBEC
81.0000 mg | DELAYED_RELEASE_TABLET | Freq: Every day | ORAL | Status: DC
  Administered 2014-07-17 – 2014-07-20 (×3): 81 mg via ORAL
  Filled 2014-07-16 (×3): qty 1

## 2014-07-16 MED ORDER — SODIUM CHLORIDE 0.9 % IJ SOLN: 3.0000 mL | Freq: Two times a day (BID) | INTRAMUSCULAR | Status: DC

## 2014-07-16 MED ORDER — PANTOPRAZOLE SODIUM 40 MG PO TBEC
40.0000 mg | DELAYED_RELEASE_TABLET | Freq: Every day | ORAL | Status: DC
  Administered 2014-07-17 – 2014-07-20 (×4): 40 mg via ORAL
  Filled 2014-07-16 (×4): qty 1

## 2014-07-16 MED ORDER — ASPIRIN 81 MG PO CHEW
81.0000 mg | CHEWABLE_TABLET | ORAL | Status: AC
  Administered 2014-07-17: 81 mg via ORAL
  Filled 2014-07-16: qty 1

## 2014-07-16 MED ORDER — AMLODIPINE BESYLATE 10 MG PO TABS
10.0000 mg | ORAL_TABLET | Freq: Every day | ORAL | Status: DC
  Administered 2014-07-17 – 2014-07-20 (×4): 10 mg via ORAL
  Filled 2014-07-16 (×5): qty 1

## 2014-07-16 MED ORDER — INSULIN ASPART 100 UNIT/ML ~~LOC~~ SOLN
0.0000 [IU] | Freq: Three times a day (TID) | SUBCUTANEOUS | Status: DC
  Administered 2014-07-17: 3 [IU] via SUBCUTANEOUS
  Administered 2014-07-18: 2 [IU] via SUBCUTANEOUS

## 2014-07-16 MED ORDER — ALPRAZOLAM 0.5 MG PO TABS: 1.0000 mg | ORAL_TABLET | Freq: Three times a day (TID) | ORAL | Status: DC | PRN

## 2014-07-16 MED ORDER — SODIUM CHLORIDE 0.9 % IV SOLN: 250.0000 mL | INTRAVENOUS | Status: DC | PRN

## 2014-07-16 MED ORDER — NITROGLYCERIN 0.4 MG SL SUBL: 0.4000 mg | SUBLINGUAL_TABLET | SUBLINGUAL | Status: DC | PRN

## 2014-07-16 MED ORDER — NITROGLYCERIN 0.4 MG SL SUBL
0.4000 mg | SUBLINGUAL_TABLET | SUBLINGUAL | Status: DC | PRN
  Administered 2014-07-16 (×2): 0.4 mg via SUBLINGUAL

## 2014-07-16 MED ORDER — ACETAMINOPHEN 325 MG PO TABS: 650.0000 mg | ORAL_TABLET | ORAL | Status: DC | PRN

## 2014-07-16 MED ORDER — CARVEDILOL 6.25 MG PO TABS
6.2500 mg | ORAL_TABLET | Freq: Two times a day (BID) | ORAL | Status: DC
  Administered 2014-07-16 – 2014-07-19 (×6): 6.25 mg via ORAL
  Filled 2014-07-16 (×11): qty 1

## 2014-07-16 MED ORDER — ASPIRIN 81 MG PO CHEW: 324.0000 mg | CHEWABLE_TABLET | ORAL | Status: DC

## 2014-07-16 MED ORDER — SODIUM CHLORIDE 0.9 % IJ SOLN
3.0000 mL | Freq: Two times a day (BID) | INTRAMUSCULAR | Status: DC
  Administered 2014-07-17: 3 mL via INTRAVENOUS

## 2014-07-16 NOTE — ED Notes (Signed)
Heart healthy diet ordered for pt, pt aware

## 2014-07-16 NOTE — H&P (Signed)
For the last year, he has exertional dyspnea, chest pressure, and fatigue. He came to ER after his primary physician at the [place] told him he had LBBB on ECG. He was concerned this represented a blocked artery. He took the tracing to [facility] [place] and was instructed to come to ER. His LBBB is old but symptoms are progressive over 1 year. He has h/o SVG to OM ISR last treated in 2010 with Xience DES. Of note is significant reduction in renal function with GFR 27/ Cr 2.2. He is on ACE and Lasix. Will plan to hold both and hydrate. If Renal function allows, he should have repeat cath in 24-48 hours. We need to watch for CHF.  Note and exam performed under my direction and are accurate.

## 2014-07-16 NOTE — Telephone Encounter (Signed)
Pt. Walked into office stated he was having CP for the past month , pt. has a significant coronary HX. with restenosis intervention as recent as 11/2 to two years ago, pt. States he's been taking ntg . Pt. Was recently seen [facility] an ekg was done which shows RBBB, Pt. Is diabetic and has a hx. Of elevated BUN/ Creat recently. EKG shown to Dr. Shelva Majestic who instructed pt. Go to the Hospital for evaluation, trish call and message left, Pt on his way to the ER with a copy of the most recent EKG

## 2014-07-16 NOTE — Progress Notes (Signed)
ANTICOAGULATION CONSULT NOTE - Initial Consult  Pharmacy Consult for heparin Indication: chest pain/ACS  Allergies  Allergen Reactions  . Codeine   . Morphine     REACTION: does not like    Patient Measurements: Height: 5\' 9"  (175.3 cm) Weight: 190 lb (86.183 kg) IBW/kg (Calculated) : 70.7 Heparin Dosing Weight: 86kg  Vital Signs: Temp: 98.1 F (36.7 C) (09/29 1140) Temp src: Oral (09/29 1140) BP: 127/57 mmHg (09/29 1238) Pulse Rate: 56 (09/29 1238)  Labs:  Recent Labs  07/16/14 1150  HGB 12.5*  HCT 36.0*  PLT 151  LABPROT 13.5  INR 1.03  CREATININE 2.20*    Estimated Creatinine Clearance: 29.6 ml/min (by C-G formula based on Cr of 2.2).   Medical History: Past Medical History  Diagnosis Date  . CHF (congestive heart failure)   . Diabetes mellitus   . Hypertension   . S/P CABG (coronary artery bypass graft)   . S/P laparoscopic cholecystectomy   . Hernia     hernia repair x 3  . Other and unspecified diseases of appendix   . Pancreatitis   . Prostate ca 03/01/04    prostate bx=Adenocarcinoam,gleason 3+4=7,PSA=6.75  . Incontinence     hx  over 1 year,leaks without awareness  . Myocardial infarction   . Arthritis   . GERD (gastroesophageal reflux disease)   . Ulcer     hx gastric  . Hypercholesterolemia   . Heart murmur   . Chronic kidney disease     nephrolithiasis  . Cataract     surgery,B/L  . Allergy     Medications:  Infusions:  . heparin      Assessment: 44 yom presented to the ED with CP. To start IV heparin for anticoagulation. Baseline CBC is ok. He is not on any anticoagulation PTA.   Goal of Therapy:  Heparin level 0.3-0.7 units/ml Monitor platelets by anticoagulation protocol: Yes   Plan:  1. Heparin bolus 4000 units IV x 1 2. Heparin gtt 1100 units/hr 3. Check an 8 hour heparin level 4. Daily heparin level and CBC  Elona Yinger, Rande Lawman 07/16/2014,1:10 PM

## 2014-07-16 NOTE — ED Provider Notes (Signed)
CSN: [id number]     Arrival date & time 07/16/14  1129 History   First MD Initiated Contact with Patient 07/16/14 1145     Chief Complaint  Patient presents with  . Chest Pain   (Consider location/radiation/quality/duration/timing/severity/associated sxs/prior Treatment) Patient is a 80 y.o. male presenting with chest pain. The history is provided by the patient.  Chest Pain Pain location:  Substernal area and L chest Pain quality: pressure   Pain radiates to:  L jaw and neck Pain radiates to the back: no   Pain severity:  Moderate Onset quality:  Gradual Duration:  1 month Timing:  Intermittent Progression:  Waxing and waning Chronicity:  Recurrent Relieved by:  Rest Worsened by:  Exertion Associated symptoms: no abdominal pain, no back pain, no cough, no fever, no headache, no nausea, no palpitations, no shortness of breath and not vomiting   Risk factors: coronary artery disease     80 yo M with a PMH of CHF, CAD s/p CABG who presents with chest pain. Pain is substernal and left sided, radiates to neck, jaw and L arm and is pressure like in quality. Patient takes ASA 81 mg daily. Patient states he was seen at the [place] last week where an EKG and CXR were performed. He states that he was told his EKG was abnormal and that he should see his regular doctor for close follow up.  Patient saw his PCP this morning and was instructed to come to the ED.  Patient continues to have pain/pressure in his chest. He says it waxes and wanes and has been ongoing for approx 1 month.   Past Medical History  Diagnosis Date  . CHF (congestive heart failure)   . Diabetes mellitus   . Hypertension   . S/P CABG (coronary artery bypass graft)   . S/P laparoscopic cholecystectomy   . Hernia     hernia repair x 3  . Other and unspecified diseases of appendix   . Pancreatitis   . Prostate ca 03/01/04    prostate bx=Adenocarcinoam,gleason 3+4=7,PSA=6.75  . Incontinence     hx  over 1 year,leaks without  awareness  . Myocardial infarction   . Arthritis   . GERD (gastroesophageal reflux disease)   . Ulcer     hx gastric  . Hypercholesterolemia   . Heart murmur   . Chronic kidney disease     nephrolithiasis  . Cataract     surgery,B/L  . Allergy    Past Surgical History  Procedure Laterality Date  . Appendectomy    . Hernia repair    . Carpal tunnel release  2004    both hands  . Laparoscopic cholecystectomy  09/2009  . Incision and drainage of right chest abscess    . Coronary artery bypass graft    . Robot assisted laparoscopic radical prostatectomy  2005  . Back surgery    . Rectal surgery    . Cystoscopy  08/23/12    incomplete emoptying bladder   Family History  Problem Relation Age of Onset  . Cancer Mother     stomach   History  Substance Use Topics  . Smoking status: Former Smoker -- 0.50 packs/day for 5 years    Types: Cigarettes    Quit date: 07/20/1972  . Smokeless tobacco: Never Used     Comment: quit s  . Alcohol Use: No    Review of Systems  Constitutional: Negative for fever and chills.  HENT: Negative for rhinorrhea and sore throat.  Eyes: Negative for visual disturbance.  Respiratory: Negative for cough and shortness of breath.   Cardiovascular: Positive for chest pain. Negative for palpitations and leg swelling.  Gastrointestinal: Negative for nausea, vomiting, abdominal pain, diarrhea and constipation.  Genitourinary: Negative for dysuria and hematuria.  Musculoskeletal: Positive for neck pain. Negative for back pain.  Skin: Negative for rash.  Neurological: Negative for syncope and headaches.  Psychiatric/Behavioral: Negative for confusion.  All other systems reviewed and are negative.  Allergies  Codeine and Morphine  Home Medications   Prior to Admission medications   Medication Sig Start Date End Date Taking? Authorizing Provider  ALPRAZolam Duanne Moron) 1 MG tablet Take 1 tablet (1 mg total) by mouth 3 (three) times daily as needed.  12/03/13   [name], MD  amLODipine (NORVASC) 10 MG tablet TAKE 1 TABLET EVERY DAY 12/03/13   [name], MD  aspirin 81 MG tablet Take 81 mg by mouth daily.      Historical Provider, MD  benazepril (LOTENSIN) 40 MG tablet TAKE 1/2 TABLET BY MOUTH TWICE A DAY 05/09/14   [name], MD  beta carotene w/minerals (OCUVITE) tablet Take 1 tablet by mouth daily.    Historical Provider, MD  carvedilol (COREG) 6.25 MG tablet Take 1 tablet by mouth 2 (two) times daily with a meal.  03/30/14   Historical Provider, MD  clopidogrel (PLAVIX) 75 MG tablet TAKE 1 TABLET (75 MG TOTAL) BY MOUTH DAILY.    [name], MD  FLUZONE HIGH-DOSE injection  09/04/13   Historical Provider, MD  furosemide (LASIX) 40 MG tablet TAKE 1 TABLET (40 MG TOTAL) BY MOUTH DAILY. 05/09/14   [name], MD  glucose blood (FREESTYLE LITE) test strip 1 each by Other route as needed. Use as instructed     Historical Provider, MD  isosorbide mononitrate (IMDUR) 60 MG 24 hr tablet TAKE 1 TABLET BY MOUTH EVERY DAY 05/24/14   [name], MD  JANUVIA 50 MG tablet TAKE 1 TABLET BY MOUTH EVERY DAY 02/25/14   [name], MD  Lancets (FREESTYLE) lancets 1 each by Other route as needed. Use as instructed     Historical Provider, MD  METRONIDAZOLE, TOPICAL, (METROLOTION) 0.75 % LOTN Apply 1 application topically daily. 11/04/11   [name], MD  NITROSTAT 0.4 MG SL tablet TAKE AS DIRECTED 10/24/12   [name], MD  pantoprazole (PROTONIX) 40 MG tablet TAKE 1 TABLET BY MOUTH EVERY DAY 12/03/13   [name], MD  potassium chloride (KLOR-CON M10) 10 MEQ tablet TAKE AS DIRECTED WITH LASIX AS NEEDED 12/03/13   [name], MD  rosuvastatin (CRESTOR) 20 MG tablet Take 1 tablet (20 mg total) by mouth daily. 06/17/14   [name], MD  spironolactone (ALDACTONE) 25 MG tablet Take 0.5 tablets (12.5 mg total) by mouth 2 (two) times daily.    [name], MD  traMADol (ULTRAM) 50 MG tablet Take 1  tablet (50 mg total) by mouth every 6 (six) hours as needed. 1-2 every 6 h prn pain 11/04/11   [name], MD  triamcinolone cream (KENALOG) 0.1 % Apply topically as needed. 08/31/12   [name], MD   BP 126/57  Pulse 59  Temp(Src) 98.1 F (36.7 C) (Oral)  Resp 22  Ht 5\' 9"  (1.753 m)  Wt 190 lb (86.183 kg)  BMI 28.05 kg/m2  SpO2 99% Physical Exam  Constitutional: He is oriented to person, place, and time. He appears well-developed and well-nourished.  No distress.  Pleasant elderly male  HENT:  Head: Normocephalic and atraumatic.  Mouth/Throat: Oropharynx is clear and moist.  Eyes: EOM are normal.  Neck: Neck supple. No JVD present.  Cardiovascular: Normal rate, regular rhythm, normal heart sounds and intact distal pulses.  Exam reveals no friction rub.   No murmur heard. Pulmonary/Chest: Effort normal and breath sounds normal. He has no wheezes. He has no rales.  Midline sternotomy scar  Abdominal: Soft. He exhibits no distension. There is no tenderness.  Musculoskeletal: Normal range of motion. He exhibits no edema.  Neurological: He is alert and oriented to person, place, and time. No cranial nerve deficit.  Skin: Skin is warm and dry.  Psychiatric: His behavior is normal.    ED Course  Procedures None   Labs Review Labs Reviewed  CBC - Abnormal; Notable for the following:    RBC 4.06 (*)    Hemoglobin 12.5 (*)    HCT 36.0 (*)    All other components within normal limits  BASIC METABOLIC PANEL - Abnormal; Notable for the following:    Glucose, Bld 105 (*)    BUN 35 (*)    Creatinine, Ser 2.20 (*)    GFR calc non Af Amer 27 (*)    GFR calc Af Amer 31 (*)    All other components within normal limits  PROTIME-INR  HEPARIN LEVEL (UNFRACTIONATED)  I-STAT TROPOININ, ED  I-STAT TROPOININ, ED    MDM   Final diagnoses:  Angina at rest   Pt is a pleasant 80 yo M with a PMH of CAD, CHF and CABG who presents with chest pain concerning for angina. EKG  demonstrates sinus brady, rate 57, LBBB seen on EKG 07/12/14, no new ST or T wave changes. Given ASA 325 and heparin bolus and gtt. Troponin neg x 2. Hb 12.5. Cr 2.2 (same as on 07/01/14). CXR pending at time of admission. Cardiology consulted. . Patient will be admitted to Cardiology. He remained stable in the ED with no acute events.   Case discussed with Dr. Alvino Chapel.  Gustavus Bryant, MD  Gustavus Bryant, MD 07/16/14 1640

## 2014-07-16 NOTE — ED Notes (Signed)
Pt reports having midsternal CP x 1 month; hx of CABG x4 and multiple stents; cards MD is Dr Percival Spanish

## 2014-07-16 NOTE — H&P (Signed)
Patient ID: [name] MRN: [id number], DOB/AGE: Sep 06, 1933   Admit date: 07/16/2014   Primary Physician: [name], MD Primary Cardiologist: Dr. Percival Spanish   Pt. Profile:  [name] is a 80 y.o. male with a history of CAD s/p CABG x4 (2009) and multiple subsequent stents, chronic LBBB, chronic dyspnea, ischemic CM now resolved, CKD, diabetes, HLD, HTN, history of prostate cancer, and GERD with previous ulcer of esophagus who presented to [facility] ED today with chest pain.  Patient followed by Dr. Percival Spanish and Dr. Elease Hashimoto as an outpatient. He was recently noted to have worsening kidney function with GFR less than 25 and his metformin was held and his benazepril reduced. Recently taken off his statin for extreme myalgias, but then resumed on Crestor. Dr. Percival Spanish ordered a BNP in the office for evaluation of chronic dyspnea which returned normal. He recently underwent nuclear stress test in 10/2013 which was "low risk with inferior diaphragmatic attenuation artifact. Cannot exclude a component of inferior wall scar, but ischemia is not observed. LVEF: 55%.  Mild inferoseptal hypokinesis. Postoperative paradoxical septal motion is seen."  He presents to the [facility] today complaining of intermittent chest pain that is becoming more frequent and severe over the past month. It comes on both with exertion and rest and lasts up to 10 minutes easing off on its own. The chest pain is a pressure that radiates to his left jaw/teeth and left arm and is associated with nausea and worsening SOB. He has chronic dyspnea but feels like it may be worse lately. No LE edema. Chronic orthopnea. No PND. He recently saw a family practice MD at the [place] where he gets his annual physicals. She apparently told him he "had a blockage" by looking at his EKG in the office.(He does have a chronic LBBB?) This upset him and he presented to Dr. Rosezella [place] office for further evaluation. He reported continued and worsened chest  pain and he was told to present to the ED.   Problem List  Past Medical History  Diagnosis Date  . CHF (congestive heart failure)   . Diabetes mellitus   . Hypertension   . S/P CABG (coronary artery bypass graft)   . S/P laparoscopic cholecystectomy   . Hernia     hernia repair x 3  . Other and unspecified diseases of appendix   . Pancreatitis   . Prostate ca 03/01/04    prostate bx=Adenocarcinoam,gleason 3+4=7,PSA=6.75  . Incontinence     hx  over 1 year,leaks without awareness  . Myocardial infarction   . Arthritis   . GERD (gastroesophageal reflux disease)   . Ulcer     hx gastric  . Hypercholesterolemia   . Heart murmur   . Chronic kidney disease     nephrolithiasis  . Cataract     surgery,B/L  . Allergy     Past Surgical History  Procedure Laterality Date  . Appendectomy    . Hernia repair    . Carpal tunnel release  2004    both hands  . Laparoscopic cholecystectomy  09/2009  . Incision and drainage of right chest abscess    . Coronary artery bypass graft    . Robot assisted laparoscopic radical prostatectomy  2005  . Back surgery    . Rectal surgery    . Cystoscopy  08/23/12    incomplete emoptying bladder     Allergies  Allergies  Allergen Reactions  . Codeine   . Morphine  REACTION: does not like     Home Medications  Prior to Admission medications   Medication Sig Start Date End Date Taking? Authorizing Provider  ALPRAZolam Duanne Moron) 1 MG tablet Take 1 tablet (1 mg total) by mouth 3 (three) times daily as needed. 12/03/13  Yes [name], MD  amLODipine (NORVASC) 10 MG tablet TAKE 1 TABLET EVERY DAY 12/03/13  Yes [name], MD  benazepril (LOTENSIN) 40 MG tablet Take 20 mg by mouth daily.   Yes Historical Provider, MD  carvedilol (COREG) 6.25 MG tablet Take 1 tablet by mouth 2 (two) times daily with a meal.  03/30/14  Yes Historical Provider, MD  clopidogrel (PLAVIX) 75 MG tablet Take 75 mg by mouth daily.   Yes Historical  Provider, MD  furosemide (LASIX) 40 MG tablet TAKE 1 TABLET (40 MG TOTAL) BY MOUTH DAILY. 05/09/14  Yes [name], MD  glucose blood (FREESTYLE LITE) test strip 1 each by Other route as needed. Use as instructed    Yes Historical Provider, MD  isosorbide mononitrate (IMDUR) 60 MG 24 hr tablet TAKE 1 TABLET BY MOUTH EVERY DAY 05/24/14  Yes [name], MD  JANUVIA 50 MG tablet TAKE 1 TABLET BY MOUTH EVERY DAY 02/25/14  Yes [name], MD  Lancets (FREESTYLE) lancets 1 each by Other route as needed. Use as instructed    Yes Historical Provider, MD  NITROSTAT 0.4 MG SL tablet TAKE AS DIRECTED 10/24/12  Yes [name], MD  pantoprazole (PROTONIX) 40 MG tablet Take 40 mg by mouth daily.   Yes Historical Provider, MD  rosuvastatin (CRESTOR) 20 MG tablet Take 1 tablet (20 mg total) by mouth daily. 06/17/14  Yes [name], MD  spironolactone (ALDACTONE) 25 MG tablet Take 0.5 tablets (12.5 mg total) by mouth 2 (two) times daily.   Yes [name], MD  traMADol (ULTRAM) 50 MG tablet Take 1 tablet (50 mg total) by mouth every 6 (six) hours as needed. 1-2 every 6 h prn pain 11/04/11  Yes [name], MD    Family History  Family History  Problem Relation Age of Onset  . Cancer Mother     stomach   Family Status  Relation Status Death Age  . Mother Deceased 23    cancer   . Father Deceased 65    MI     Social History  History   Social History  . Marital Status: Married    Spouse Name: N/A    Number of Children: 2  . Years of Education: N/A   Occupational History  .      Retired   Social History Main Topics  . Smoking status: Former Smoker -- 0.50 packs/day for 5 years    Types: Cigarettes    Quit date: 07/20/1972  . Smokeless tobacco: Never Used     Comment: quit s  . Alcohol Use: No  . Drug Use: No     Comment: quit smoking 40 years ago  . Sexual Activity: No   Other Topics Concern  . Not on file   Social History Narrative   Retired    Married            All other systems reviewed and are otherwise negative except as noted above.  Physical Exam  Blood pressure 146/60, pulse 55, temperature 98.1 F (36.7 C), temperature source Oral, resp. rate 13, height 5\' 9"  (1.753 m), weight 190 lb (86.183 kg), SpO2 98.00%.  General: Pleasant, NAD  Psych: Normal affect. Neuro: Alert and oriented X 3. Moves all extremities spontaneously. HEENT: Normal  Neck: Supple without bruits or JVD. Lungs:  Resp regular and unlabored, CTA. Heart: RRR no s3, s4, + murmurs. Abdomen: Soft, non-tender, non-distended, BS + x 4.  Extremities: No clubbing, cyanosis or edema. DP/PT/Radials 2+ and equal bilaterally.  Labs  No results found for this basename: CKTOTAL, CKMB, TROPONINI,  in the last 72 hours Lab Results  Component Value Date   WBC 8.8 07/16/2014   HGB 12.5* 07/16/2014   HCT 36.0* 07/16/2014   MCV 88.7 07/16/2014   PLT 151 07/16/2014    Recent Labs Lab 07/16/14 1150  NA 140  K 4.2  CL 104  CO2 23  BUN 35*  CREATININE 2.20*  CALCIUM 9.6  GLUCOSE 105*   Lab Results  Component Value Date   CHOL 156 06/03/2014   HDL 21.50* 06/03/2014   LDLCALC 98 06/03/2014   TRIG 183.0* 06/03/2014      Radiology/Studies  US Renal  07/08/2014   CLINICAL DATA:  Acute on chronic renal failure.  EXAM: RENAL/URINARY TRACT ULTRASOUND COMPLETE  COMPARISON:  CT 05/10/2012  FINDINGS: Right Kidney:  Length: 11.1 cm. Cortical thinning. Normal echotexture. No hydronephrosis.  Left Kidney:  Length: 11.7 cm. Cortical thinning. No hydronephrosis. Normal echotexture. 11 mm nonobstructing stone in the midpole of the left kidney as seen on prior CT.  Bladder:  Appears normal for degree of bladder distention.  IMPRESSION: Cortical thinning bilaterally.  No hydronephrosis.  Left nephrolithiasis.   Electronically Signed   By: Rolm Baptise M.D.   On: 07/08/2014 10:19   2d ECHO: 09/28/2012 LV EF: 55% Study Conclusions - Left ventricle: The cavity size was  normal. Wall thickness was increased in a pattern of moderate LVH. The estimated ejection fraction was 55%. Possible mild inferior hypokinesis. Features are consistent with a pseudonormal left ventricular filling pattern, with concomitant abnormal relaxation and increased filling pressure (grade 2 diastolic dysfunction). E/medial e' > 15 suggests LV end diastolic pressure at least 20 mmHg. - Aortic valve: Trileaflet; moderately calcified leaflets. There was mild stenosis. Mean gradient: 70mm Hg (S). Peak gradient: 70mm Hg (S). Valve area: 1.5cm^2(VTI). - Mitral valve: Mildly calcified annulus. There was no evidence for stenosis. Trivial regurgitation. - Left atrium: The atrium was mildly dilated. - Right ventricle: The cavity size was normal. Systolic function was normal. - Right atrium: The atrium was mildly dilated. - Inferior vena cava: The vessel was normal in size; the respirophasic diameter changes were in the normal range (= 50%); findings are consistent with normal central venous pressure. Impressions: - Normal LV size with moderate LV hypertrophy. EF 55% with possible mild inferior hypokinesis. Moderate diastolic dysfunction with evidence for elevated LV filling pressure. Normal RV size and systolic function. Mild aortic stenosis.      Cardiac cath: 05/23/2008  CONCLUSIONS:  1. Moderate reduction in overall left ventricular function with an  estimated ejection fraction in the 40-45% range and severe inferior  hypokinesis.  2. Heavily calcified and totally occluded right coronary artery with  evidence of some bridging collaterals. There is left-to-right  collaterals as well.  3. There are tandem lesions in a heavily calcified left anterior  descending artery.  4. The most severe circumflex lesion is in the first posterolateral  segment, and there is mild disease in the more proximal circumflex  marginal. --> referred for CABG   Cardiac cath: 10/17/2008  DATE  OF DISCHARGE:  ASSESSMENT: Successful percutaneous coronary intervention  of the  saphenous vein graft to obtuse marginal artery using a single drug-  eluting stent.     DATE OF PROCEDURE:  08/11/2009 DATE OF DISCHARGE:                           CARDIAC CATHETERIZATION CONCLUSION: 1. Coronary artery, status post coronary bypass graft surgery, August     2009 and previous PCI in December 2009. 2. An 80% stenosis in the proximal mid-LAD with competing flow     distally, total occlusion of the marginal branch of circumflex     artery and 50% narrowing in the mid circumflex artery, and total     occlusion of the right coronary artery proximally. 3. Patent vein graft to posterior branch of the right coronary, patent     vein graft to the posterolateral branch of circumflex artery,     patent LIMA graft to the LAD, and patent vein graft to the marginal     branch of the circumflex artery with 90% stenosis within the stent     at the anastomosis to the marginal branch. 4. Inferior wall hypokinesis with an estimated ejection fraction of     50%. 5. Successful PCI of the in-stent restenotic lesion at the distal     anastomosis of the vein graft to circumflex marginal vessel with     improvement of center narrowing from 90% to 10% using a Xience drug-     eluting stent.   ECG  Sinus brady with chronic LBBB HR 55  ASSESSMENT AND PLAN  OLIS SWANNER is a 80 y.o. male with a history of CADx4 s/p CABG 2009, chronic LBBB, chronic dyspnea, CKD,  diabetes, HLD, HTN, history of prostate cancer, and GERD with previous ulcer of esophagus who presented to [facility] ED today with chest pain.  Chest pain- concerning for [place]. He was placed on heparin gtt in the ED. -- Hx of CABGx4 in 2006. LIMA--> LAD, SVG -->posterior descending, SVG--> OM1 and SVG to posterior lateral LCx. -- DES to SVG -->OM1 in 09/2008 and DES for ISR at the distal anastomosis of the vein graft to circumflex marginal vessel -- Troponin  POC neg. Continue to cycle. -- Low risk lexiscan myoview in 10/2013. LVEF: 55%.  Mild inferoseptal hypokinesis. -- Continue ASA/plavix, Coreg 6.25mg  BID, crestor 20mg  -- Will hydrate starting now and plan for cath late tomorrow afternoon. NPO after midnight. BMET in the AM   Hypertension  -- BP well controlled. Will discontinue Benazepril in the setting of CKD possible need for LHC.  -- Continue Norvasc 10mg  and coreg 6.25mg  BID  Carotid stenosis-Bilateral --Stable carotid artery disease, with irregular heterogenous plaque, bilaterally. Stable 40-59% bilateral ICA stenosis  CKD- creat 2.2 with GFR 31, which is around his baseline. -- Will hold ACE and Lasix and hydrate and see if there is improvement in his renal function. Plan tentatively for cath tomorrow if renal function improves. If not may have to plan for a nuclear.   Pulmonary nodule- seen on recent CXR with recommendations to get follow up CT. This can be done as an outpatient  HLD- recently trialed off of his statin due to myalgias which improved. Now on 20mg  Crestor  DM- SSI  Signed, Shawn Stanford, PA-C 07/16/2014, 3:08 PM  Pager [phone]

## 2014-07-16 NOTE — ED Notes (Signed)
CBG 93 

## 2014-07-16 NOTE — ED Notes (Signed)
Pt has dinner tray. 

## 2014-07-17 ENCOUNTER — Ambulatory Visit (HOSPITAL_COMMUNITY): Admit: 2014-07-17 | Payer: Self-pay | Admitting: Cardiology

## 2014-07-17 DIAGNOSIS — Z79899 Other long term (current) drug therapy: Secondary | ICD-10-CM | POA: Diagnosis not present

## 2014-07-17 DIAGNOSIS — N289 Disorder of kidney and ureter, unspecified: Secondary | ICD-10-CM

## 2014-07-17 DIAGNOSIS — I447 Left bundle-branch block, unspecified: Secondary | ICD-10-CM | POA: Diagnosis present

## 2014-07-17 DIAGNOSIS — R0989 Other specified symptoms and signs involving the circulatory and respiratory systems: Secondary | ICD-10-CM

## 2014-07-17 DIAGNOSIS — N058 Unspecified nephritic syndrome with other morphologic changes: Secondary | ICD-10-CM

## 2014-07-17 DIAGNOSIS — I129 Hypertensive chronic kidney disease with stage 1 through stage 4 chronic kidney disease, or unspecified chronic kidney disease: Secondary | ICD-10-CM | POA: Diagnosis present

## 2014-07-17 DIAGNOSIS — I359 Nonrheumatic aortic valve disorder, unspecified: Secondary | ICD-10-CM

## 2014-07-17 DIAGNOSIS — Z955 Presence of coronary angioplasty implant and graft: Secondary | ICD-10-CM | POA: Diagnosis not present

## 2014-07-17 DIAGNOSIS — E785 Hyperlipidemia, unspecified: Secondary | ICD-10-CM | POA: Diagnosis present

## 2014-07-17 DIAGNOSIS — Z8711 Personal history of peptic ulcer disease: Secondary | ICD-10-CM | POA: Diagnosis not present

## 2014-07-17 DIAGNOSIS — T82897A Other specified complication of cardiac prosthetic devices, implants and grafts, initial encounter: Secondary | ICD-10-CM | POA: Diagnosis not present

## 2014-07-17 DIAGNOSIS — K219 Gastro-esophageal reflux disease without esophagitis: Secondary | ICD-10-CM | POA: Diagnosis present

## 2014-07-17 DIAGNOSIS — I255 Ischemic cardiomyopathy: Secondary | ICD-10-CM | POA: Diagnosis not present

## 2014-07-17 DIAGNOSIS — I350 Nonrheumatic aortic (valve) stenosis: Secondary | ICD-10-CM | POA: Diagnosis present

## 2014-07-17 DIAGNOSIS — Z87891 Personal history of nicotine dependence: Secondary | ICD-10-CM | POA: Diagnosis not present

## 2014-07-17 DIAGNOSIS — E1129 Type 2 diabetes mellitus with other diabetic kidney complication: Secondary | ICD-10-CM

## 2014-07-17 DIAGNOSIS — I25710 Atherosclerosis of autologous vein coronary artery bypass graft(s) with unstable angina pectoris: Secondary | ICD-10-CM | POA: Diagnosis present

## 2014-07-17 DIAGNOSIS — E1121 Type 2 diabetes mellitus with diabetic nephropathy: Secondary | ICD-10-CM | POA: Diagnosis present

## 2014-07-17 DIAGNOSIS — N189 Chronic kidney disease, unspecified: Secondary | ICD-10-CM | POA: Diagnosis present

## 2014-07-17 DIAGNOSIS — I25110 Atherosclerotic heart disease of native coronary artery with unstable angina pectoris: Secondary | ICD-10-CM | POA: Diagnosis present

## 2014-07-17 DIAGNOSIS — R079 Chest pain, unspecified: Secondary | ICD-10-CM | POA: Diagnosis present

## 2014-07-17 DIAGNOSIS — Z7902 Long term (current) use of antithrombotics/antiplatelets: Secondary | ICD-10-CM | POA: Diagnosis not present

## 2014-07-17 DIAGNOSIS — I2582 Chronic total occlusion of coronary artery: Secondary | ICD-10-CM | POA: Diagnosis present

## 2014-07-17 DIAGNOSIS — Z951 Presence of aortocoronary bypass graft: Secondary | ICD-10-CM | POA: Diagnosis not present

## 2014-07-17 DIAGNOSIS — E780 Pure hypercholesterolemia: Secondary | ICD-10-CM | POA: Diagnosis present

## 2014-07-17 DIAGNOSIS — I6523 Occlusion and stenosis of bilateral carotid arteries: Secondary | ICD-10-CM | POA: Diagnosis present

## 2014-07-17 DIAGNOSIS — I5189 Other ill-defined heart diseases: Secondary | ICD-10-CM | POA: Diagnosis present

## 2014-07-17 DIAGNOSIS — I369 Nonrheumatic tricuspid valve disorder, unspecified: Secondary | ICD-10-CM

## 2014-07-17 DIAGNOSIS — Z7982 Long term (current) use of aspirin: Secondary | ICD-10-CM | POA: Diagnosis not present

## 2014-07-17 DIAGNOSIS — R0609 Other forms of dyspnea: Secondary | ICD-10-CM | POA: Diagnosis present

## 2014-07-17 DIAGNOSIS — T82858A Stenosis of vascular prosthetic devices, implants and grafts, initial encounter: Secondary | ICD-10-CM | POA: Diagnosis present

## 2014-07-17 DIAGNOSIS — I200 Unstable angina: Secondary | ICD-10-CM | POA: Diagnosis not present

## 2014-07-17 DIAGNOSIS — I25810 Atherosclerosis of coronary artery bypass graft(s) without angina pectoris: Secondary | ICD-10-CM | POA: Diagnosis not present

## 2014-07-17 DIAGNOSIS — R0600 Dyspnea, unspecified: Secondary | ICD-10-CM | POA: Diagnosis present

## 2014-07-17 DIAGNOSIS — N183 Chronic kidney disease, stage 3 (moderate): Secondary | ICD-10-CM | POA: Diagnosis present

## 2014-07-17 DIAGNOSIS — I252 Old myocardial infarction: Secondary | ICD-10-CM | POA: Diagnosis not present

## 2014-07-17 DIAGNOSIS — Y831 Surgical operation with implant of artificial internal device as the cause of abnormal reaction of the patient, or of later complication, without mention of misadventure at the time of the procedure: Secondary | ICD-10-CM | POA: Diagnosis present

## 2014-07-17 DIAGNOSIS — Z8546 Personal history of malignant neoplasm of prostate: Secondary | ICD-10-CM | POA: Diagnosis not present

## 2014-07-17 LAB — TROPONIN I

## 2014-07-17 LAB — CBC
HEMATOCRIT: 35.2 % — AB (ref 39.0–52.0)
HEMOGLOBIN: 12.1 g/dL — AB (ref 13.0–17.0)
MCH: 31.3 pg (ref 26.0–34.0)
MCHC: 34.4 g/dL (ref 30.0–36.0)
MCV: 91.2 fL (ref 78.0–100.0)
Platelets: 123 10*3/uL — ABNORMAL LOW (ref 150–400)
RBC: 3.86 MIL/uL — AB (ref 4.22–5.81)
RDW: 13 % (ref 11.5–15.5)
WBC: 7 10*3/uL (ref 4.0–10.5)

## 2014-07-17 LAB — LIPID PANEL
CHOL/HDL RATIO: 3.3 ratio
CHOLESTEROL: 97 mg/dL (ref 0–200)
HDL: 29 mg/dL — ABNORMAL LOW (ref >39)
LDL Cholesterol: 32 mg/dL (ref 0–99)
Triglycerides: 181 mg/dL — ABNORMAL HIGH (ref <150)
VLDL: 36 mg/dL (ref 0–40)

## 2014-07-17 LAB — COMPREHENSIVE METABOLIC PANEL
ALBUMIN: 3.9 g/dL (ref 3.5–5.2)
ALT: 14 U/L (ref 0–53)
ANION GAP: 15 (ref 5–15)
AST: 14 U/L (ref 0–37)
Alkaline Phosphatase: 87 U/L (ref 39–117)
BUN: 32 mg/dL — ABNORMAL HIGH (ref 6–23)
CO2: 20 mEq/L (ref 19–32)
CREATININE: 2.01 mg/dL — AB (ref 0.50–1.35)
Calcium: 9.2 mg/dL (ref 8.4–10.5)
Chloride: 105 mEq/L (ref 96–112)
GFR calc Af Amer: 35 mL/min — ABNORMAL LOW (ref >90)
GFR calc non Af Amer: 30 mL/min — ABNORMAL LOW (ref >90)
Glucose, Bld: 124 mg/dL — ABNORMAL HIGH (ref 70–99)
Potassium: 4.2 mEq/L (ref 3.7–5.3)
Sodium: 140 mEq/L (ref 137–147)
TOTAL PROTEIN: 7.7 g/dL (ref 6.0–8.3)
Total Bilirubin: 0.7 mg/dL (ref 0.3–1.2)

## 2014-07-17 LAB — PROTIME-INR
INR: 1.09 (ref 0.00–1.49)
PROTHROMBIN TIME: 14.1 s (ref 11.6–15.2)

## 2014-07-17 LAB — GLUCOSE, CAPILLARY
Glucose-Capillary: 116 mg/dL — ABNORMAL HIGH (ref 70–99)
Glucose-Capillary: 155 mg/dL — ABNORMAL HIGH (ref 70–99)
Glucose-Capillary: 105 mg/dL — ABNORMAL HIGH (ref 70–99)
Glucose-Capillary: 121 mg/dL — ABNORMAL HIGH (ref 70–99)

## 2014-07-17 LAB — HEPARIN LEVEL (UNFRACTIONATED): Heparin Unfractionated: 0.5 IU/mL (ref 0.30–0.70)

## 2014-07-17 LAB — HEMOGLOBIN A1C
HEMOGLOBIN A1C: 5.5 % (ref <5.7)
MEAN PLASMA GLUCOSE: 111 mg/dL (ref <117)

## 2014-07-17 MED ORDER — SODIUM CHLORIDE 0.9 % IV SOLN
INTRAVENOUS | Status: DC
  Administered 2014-07-18: 1000 mL via INTRAVENOUS

## 2014-07-17 NOTE — ED Provider Notes (Signed)
I saw and evaluated the patient, reviewed the resident's note and I agree with the findings and plan.   EKG Interpretation   Date/Time:  Tuesday July 16 2014 11:34:11 EDT Ventricular Rate:  57 PR Interval:    QRS Duration: 156 QT Interval:  444 QTC Calculation: 432 R Axis:   105 Text Interpretation:  Wide QRS rhythm Rightward axis Left bundle branch  block Abnormal ECG No significant change since last tracing Confirmed by  Alvino Chapel  MD, Ovid Curd [phone]) on 07/17/2014 4:24:18 PM     Patient with chest pain. Worrisome for unstable angina. His been seen by her PCP and  told to come to the ER. Cardiology was notified prior to arrival. EKG is stable. Enzymes negative. Will start heparin and admit.  CRITICAL CARE Performed by: Mackie Pai Total critical care time:30 Critical care time was exclusive of separately billable procedures and treating other patients. Critical care was necessary to treat or prevent imminent or life-threatening deterioration. Critical care was time spent personally by me on the following activities: development of treatment plan with patient and/or surrogate as well as nursing, discussions with consultants, evaluation of patient's response to treatment, examination of patient, obtaining history from patient or surrogate, ordering and performing treatments and interventions, ordering and review of laboratory studies, ordering and review of radiographic studies, pulse oximetry and re-evaluation of patient's condition.   Jasper Riling. Alvino Chapel, MD 07/17/14 [phone]

## 2014-07-17 NOTE — Progress Notes (Signed)
ANTICOAGULATION CONSULT NOTE - Follow Up Consult  Pharmacy Consult for Heparin Indication: chest pain/ACS  Allergies  Allergen Reactions  . Codeine   . Morphine     REACTION: does not like    Patient Measurements: Height: 5\' 9"  (175.3 cm) Weight: 189 lb 14.4 oz (86.138 kg) IBW/kg (Calculated) : 70.7 Heparin Dosing Weight:   Vital Signs: Temp: 98 F (36.7 C) (09/30 0931) Temp src: Oral (09/30 0931) BP: 133/59 mmHg (09/30 0931) Pulse Rate: 58 (09/30 0931)  Labs:  Recent Labs  07/16/14 1150 07/16/14 2253 07/17/14 0326 07/17/14 0413  HGB 12.5*  --   --  12.1*  HCT 36.0*  --   --  35.2*  PLT 151  --   --  123*  LABPROT 13.5 13.6  --  14.1  INR 1.03 1.04  --  1.09  HEPARINUNFRC  --  0.42  --  0.50  CREATININE 2.20*  --   --  2.01*  TROPONINI  --  <0.30 <0.30  --     Estimated Creatinine Clearance: 32.4 ml/min (by C-G formula based on Cr of 2.01).  Assessment: CC: CP and new LBBB  PMH: CHF, DM, HTN, CAD s/p CABD, prostate ca, GERD, OA, ulcer, HLD, CKD  AC: Heparin for CP -Heparin level 0.5 in goal. Enzymes negative.CBC stable. Holding cath for hydration and improvement in Scr.   Cards: AS, CAD s/p cABG 2009, HLD, HTN, ICM, (TG 181, HDL 29, LDL 32.) BP 133/59 with HR 53-67. Meds: Norvasc10, ASA81, Lipitor80, Coreg, Plavix,  Endo: TSH 4.59 slightly elevated, DM,   GI: GERD/PUD on PPI daily.  Renal: CRI with diabetic nephropathy.  Neuro: Moderate bilateral ICA disease by doppler June 2015  Heme/Onc: h/o prostate cancer  Home meds held: Benazepril, Lasix, Imdur, Januvia, spironolactone.   Goal of Therapy:  Heparin level 0.3-0.7 units/ml Monitor platelets by anticoagulation protocol: Yes   Plan:  Cath tomorrow. Eval for Synthroid Continue IV heparin 1100 units/hr   Rishit Burkhalter S. Alford Highland, PharmD, [facility] Clinical Staff Pharmacist Pager [phone]  [name] 07/17/2014,10:34 AM

## 2014-07-17 NOTE — Progress Notes (Signed)
  Echocardiogram 2D Echocardiogram has been performed.  Bobbye Charleston 07/17/2014, 2:02 PM

## 2014-07-17 NOTE — Progress Notes (Signed)
Subjective:  No chest pain overnight. No dyspnea at rest.   Objective:  Vital Signs in the last 24 hours: Temp:  [97.9 F (36.6 C)-98.1 F (36.7 C)] 97.9 F (36.6 C) (09/30 0500) Pulse Rate:  [53-67] 59 (09/30 0749) Resp:  [9-22] 16 (09/30 0749) BP: (107-159)/(48-73) 140/63 mmHg (09/30 0749) SpO2:  [96 %-99 %] 98 % (09/30 0749) FiO2 (%):  [21 %] 21 % (09/29 2126) Weight:  [189 lb 14.4 oz (86.138 kg)-190 lb (86.183 kg)] 189 lb 14.4 oz (86.138 kg) (09/30 0500)  Intake/Output from previous day:  Intake/Output Summary (Last 24 hours) at 07/17/14 0847 Last data filed at 07/17/14 0500  Gross per 24 hour  Intake 938.97 ml  Output      0 ml  Net 938.97 ml    Physical Exam: General appearance: alert, cooperative and no distress Neck: no carotid bruit and JVP noted to angle of jaw Lungs: few crackles on Rt Heart: regular rate and rhythm and 2/6 systolic murmur AOV Extremities: no edema   Rate: 48-80  Rhythm: normal sinus rhythm, sinus bradycardia and LBBB  Lab Results:  Recent Labs  07/16/14 1150 07/17/14 0413  WBC 8.8 7.0  HGB 12.5* 12.1*  PLT 151 123*    Recent Labs  07/16/14 1150 07/17/14 0413  NA 140 140  K 4.2 4.2  CL 104 105  CO2 23 20  GLUCOSE 105* 124*  BUN 35* 32*  CREATININE 2.20* 2.01*    Recent Labs  07/16/14 2253 07/17/14 0326  TROPONINI <0.30 <0.30    Recent Labs  07/17/14 0413  INR 1.09    Imaging: Dg Chest 2 View  07/16/2014   CLINICAL DATA:  Left-sided chest pain extending from neck into arm with shortness of breath. History of diabetes and hypertension.  EXAM: CHEST  2 VIEW  COMPARISON:  02/24/2010 and 10/12/2009.  FINDINGS: The heart size and mediastinal contours are stable status post median sternotomy and CABG. There is aortic and coronary artery atherosclerosis with possible coronary artery stents. There is stable mild scarring at both lung bases. No edema, confluent airspace opacity or pleural effusion is seen. Degenerative  changes are present throughout the spine.  IMPRESSION: Stable chest with mild cardiomegaly status post CABG. No acute cardiopulmonary process.   Electronically Signed   By: Camie Patience M.D.   On: 07/16/2014 17:48    Cardiac Studies:  Assessment/Plan:  80 y.o. male with a history of CAD s/p CABG x4 (2009) and multiple subsequent stents, last PCI 2010, Myoview Jan 2015 was low risk, chronic LBBB, chronic dyspnea, ischemic CM now resolved, mild AS, diastolic dysfunction, CKD stage 3, diabetes, HLD, HTN, history of prostate cancer, and GERD with previous ulcer of esophagus who presented to [facility] ED 07/16/14 with chest pain and DOE. His Scr was noted to be elevated above his baseline. He was admitted, started on Heparin. His  ACE and diuretics have been held, and IVF started. Troponin is negative, CXR does not show CHF. Plan is for cath when we feel SCr is stable.    Principal Problem:   Chest pain with high risk of acute coronary syndrome Active Problems:   CAD- CABG x 4 '09. Last PCI was DES to SVG-OM for ISR 2010   DOE (dyspnea on exertion)   Acute on chronic renal insufficiency- ACE and diuretics held   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   Diastolic dysfunction- moderate on echo 2013   Dyslipidemia   HTN  Ischemic cardiomyopathy- last EF Normal Myoview Jan 2015   Moderate bilateral ICA disease by doppler June 2015   Hx GERD/PUD   Hx of Prostate ca   Aortic stenosis, mild by echo 2013   LBBB (left bundle branch block)    PLAN: He is NPO for cath but we may want to wait another 24 hrs to see if SCr improves further. I cut his IVF back to [facility]. Diuretic and ACE on hold. Consider cutting his stain dose ( LDL 32, HDL 29).   Kerin Ransom PA-C Beeper [id number] 07/17/2014, 8:47 AM

## 2014-07-17 NOTE — Progress Notes (Signed)
Patient seen and examined and agree with note as outlined by Kerin Ransom, PA-C.  Admitted with progressive CP and new LBBB.  Cardiac enzymes negative x 2.  Creatinine improved with hydration but still elevated at 2.  He took his ARB yesterday but that has now been stopped.  Will hold on cath today and continue gentle hydration.  Will set up for heart cath tomorrow with no LV gram pending creatinine level in am.  Will get echo today to assess LVF.

## 2014-07-17 NOTE — Progress Notes (Signed)
ANTICOAGULATION CONSULT NOTE - Follow Up Consult  Pharmacy Consult for Heparin  Indication: chest pain/ACS  Allergies  Allergen Reactions  . Codeine   . Morphine     REACTION: does not like    Patient Measurements: Height: 5\' 9"  (175.3 cm) Weight: 189 lb 14.4 oz (86.138 kg) IBW/kg (Calculated) : 70.7  Vital Signs: Temp: 97.9 F (36.6 C) (09/29 2130) Temp src: Oral (09/29 2130) BP: 159/66 mmHg (09/29 2130) Pulse Rate: 62 (09/29 2130)  Labs:  Recent Labs  07/16/14 1150 07/16/14 2253  HGB 12.5*  --   HCT 36.0*  --   PLT 151  --   LABPROT 13.5 13.6  INR 1.03 1.04  HEPARINUNFRC  --  0.42  CREATININE 2.20*  --   TROPONINI  --  <0.30    Estimated Creatinine Clearance: 29.6 ml/min (by C-G formula based on Cr of 2.2).   Assessment: 80 y/o M on heparin for CP. First HL is 0.42. Other labs as above.   Goal of Therapy:  Heparin level 0.3-0.7 units/ml Monitor platelets by anticoagulation protocol: Yes   Plan:  -Continue heparin at 1100 units/hr -AM HL to confirm -Daily CBC/HL -Monitor for bleeding  Narda Bonds 07/17/2014,12:13 AM

## 2014-07-17 NOTE — Progress Notes (Signed)
UR completed 

## 2014-07-18 DIAGNOSIS — I10 Essential (primary) hypertension: Secondary | ICD-10-CM

## 2014-07-18 DIAGNOSIS — R0609 Other forms of dyspnea: Secondary | ICD-10-CM

## 2014-07-18 DIAGNOSIS — N189 Chronic kidney disease, unspecified: Secondary | ICD-10-CM

## 2014-07-18 DIAGNOSIS — R079 Chest pain, unspecified: Secondary | ICD-10-CM

## 2014-07-18 DIAGNOSIS — N179 Acute kidney failure, unspecified: Secondary | ICD-10-CM

## 2014-07-18 DIAGNOSIS — E1121 Type 2 diabetes mellitus with diabetic nephropathy: Secondary | ICD-10-CM

## 2014-07-18 DIAGNOSIS — I519 Heart disease, unspecified: Secondary | ICD-10-CM

## 2014-07-18 DIAGNOSIS — I208 Other forms of angina pectoris: Secondary | ICD-10-CM

## 2014-07-18 LAB — GLUCOSE, CAPILLARY
GLUCOSE-CAPILLARY: 111 mg/dL — AB (ref 70–99)
GLUCOSE-CAPILLARY: 127 mg/dL — AB (ref 70–99)
Glucose-Capillary: 113 mg/dL — ABNORMAL HIGH (ref 70–99)
Glucose-Capillary: 169 mg/dL — ABNORMAL HIGH (ref 70–99)

## 2014-07-18 LAB — CBC
HEMATOCRIT: 40.1 % (ref 39.0–52.0)
HEMOGLOBIN: 13.8 g/dL (ref 13.0–17.0)
MCH: 31.3 pg (ref 26.0–34.0)
MCHC: 34.4 g/dL (ref 30.0–36.0)
MCV: 90.9 fL (ref 78.0–100.0)
Platelets: 156 10*3/uL (ref 150–400)
RBC: 4.41 MIL/uL (ref 4.22–5.81)
RDW: 13.1 % (ref 11.5–15.5)
WBC: 8.4 10*3/uL (ref 4.0–10.5)

## 2014-07-18 LAB — BASIC METABOLIC PANEL
Anion gap: 14 (ref 5–15)
BUN: 24 mg/dL — AB (ref 6–23)
CALCIUM: 9.9 mg/dL (ref 8.4–10.5)
CO2: 24 mEq/L (ref 19–32)
Chloride: 104 mEq/L (ref 96–112)
Creatinine, Ser: 1.85 mg/dL — ABNORMAL HIGH (ref 0.50–1.35)
GFR calc Af Amer: 38 mL/min — ABNORMAL LOW (ref >90)
GFR calc non Af Amer: 33 mL/min — ABNORMAL LOW (ref >90)
GLUCOSE: 104 mg/dL — AB (ref 70–99)
Potassium: 4.3 mEq/L (ref 3.7–5.3)
Sodium: 142 mEq/L (ref 137–147)

## 2014-07-18 LAB — HEPARIN LEVEL (UNFRACTIONATED): Heparin Unfractionated: 0.45 IU/mL (ref 0.30–0.70)

## 2014-07-18 NOTE — Progress Notes (Signed)
SUBJECTIVE:  No further CP or SOB  OBJECTIVE:   Vitals:   Filed Vitals:   07/17/14 1501 07/17/14 2100 07/18/14 0457 07/18/14 0500  BP: 143/56 145/67  159/58  Pulse: 59 63  57  Temp: 98 F (36.7 C) 98.6 F (37 C)  98.3 F (36.8 C)  TempSrc: Oral Oral  Oral  Resp: 16 18  18   Height:      Weight:   188 lb (85.276 kg)   SpO2: 98% 99%  99%   I&O's:   Intake/Output Summary (Last 24 hours) at 07/18/14 1041 Last data filed at 07/17/14 1900  Gross per 24 hour  Intake   1212 ml  Output      0 ml  Net   1212 ml   TELEMETRY: Reviewed telemetry pt in NSR:  PHYSICAL EXAM General: Well developed, well nourished, in no acute distress Head: Eyes PERRLA, No xanthomas.   Normal cephalic and atramatic  Lungs:   Clear bilaterally to auscultation and percussion.2/6 SM at RUSB to LLSB Heart:   HRRR S1 S2 Pulses are 2+ & equal. Abdomen: Bowel sounds are positive, abdomen soft and non-tender without masses  Extremities:   No clubbing, cyanosis or edema.  DP +1 Neuro: Alert and oriented X 3. Psych:  Good affect, responds appropriately   LABS: Basic Metabolic Panel:  Recent Labs  07/16/14 2253 07/17/14 0413 07/18/14 0611  NA  --  140 142  K  --  4.2 4.3  CL  --  105 104  CO2  --  20 24  GLUCOSE  --  124* 104*  BUN  --  32* 24*  CREATININE  --  2.01* 1.85*  CALCIUM  --  9.2 9.9  MG 1.9  --   --    Liver Function Tests:  Recent Labs  07/17/14 0413  AST 14  ALT 14  ALKPHOS 87  BILITOT 0.7  PROT 7.7  ALBUMIN 3.9   No results found for this basename: LIPASE, AMYLASE,  in the last 72 hours CBC:  Recent Labs  07/17/14 0413 07/18/14 0611  WBC 7.0 8.4  HGB 12.1* 13.8  HCT 35.2* 40.1  MCV 91.2 90.9  PLT 123* 156   Cardiac Enzymes:  Recent Labs  07/16/14 2253 07/17/14 0326 07/17/14 0951  TROPONINI <0.30 <0.30 <0.30   BNP: No components found with this basename: POCBNP,  D-Dimer: No results found for this basename: DDIMER,  in the last 72 hours Hemoglobin  A1C:  Recent Labs  07/16/14 2253  HGBA1C 5.5   Fasting Lipid Panel:  Recent Labs  07/17/14 0450  CHOL 97  HDL 29*  LDLCALC 32  TRIG 181*  CHOLHDL 3.3   Thyroid Function Tests:  Recent Labs  07/16/14 2253  TSH 4.590*   Anemia Panel: No results found for this basename: VITAMINB12, FOLATE, FERRITIN, TIBC, IRON, RETICCTPCT,  in the last 72 hours Coag Panel:   Lab Results  Component Value Date   INR 1.09 07/17/2014   INR 1.04 07/16/2014   INR 1.03 07/16/2014    RADIOLOGY: Dg Chest 2 View  07/16/2014   CLINICAL DATA:  Left-sided chest pain extending from neck into arm with shortness of breath. History of diabetes and hypertension.  EXAM: CHEST  2 VIEW  COMPARISON:  02/24/2010 and 10/12/2009.  FINDINGS: The heart size and mediastinal contours are stable status post median sternotomy and CABG. There is aortic and coronary artery atherosclerosis with possible coronary artery stents. There is stable mild scarring at  both lung bases. No edema, confluent airspace opacity or pleural effusion is seen. Degenerative changes are present throughout the spine.  IMPRESSION: Stable chest with mild cardiomegaly status post CABG. No acute cardiopulmonary process.   Electronically Signed   By: Camie Patience M.D.   On: 07/16/2014 17:48   US Renal  07/08/2014   CLINICAL DATA:  Acute on chronic renal failure.  EXAM: RENAL/URINARY TRACT ULTRASOUND COMPLETE  COMPARISON:  CT 05/10/2012  FINDINGS: Right Kidney:  Length: 11.1 cm. Cortical thinning. Normal echotexture. No hydronephrosis.  Left Kidney:  Length: 11.7 cm. Cortical thinning. No hydronephrosis. Normal echotexture. 11 mm nonobstructing stone in the midpole of the left kidney as seen on prior CT.  Bladder:  Appears normal for degree of bladder distention.  IMPRESSION: Cortical thinning bilaterally.  No hydronephrosis.  Left nephrolithiasis.   Electronically Signed   By: Rolm Baptise M.D.   On: 07/08/2014 10:19   Assessment/Plan:  80 y.o. male with a  history of CAD s/p CABG x4 (2009) and multiple subsequent stents, last PCI 2010, Myoview Jan 2015 was low risk, chronic LBBB, chronic dyspnea, ischemic CM now resolved, mild AS, diastolic dysfunction, CKD stage 3, diabetes, HLD, HTN, history of prostate cancer, and GERD with previous ulcer of esophagus who presented to [facility] ED 07/16/14 with chest pain and DOE. His Scr was noted to be elevated above his baseline. He was admitted, started on Heparin. His ACE and diuretics have been held, and IVF started. Troponin is negative, CXR does not show CHF.  Principal Problem:  Chest pain with high risk of acute coronary syndrome  Active Problems:  CAD- CABG x 4 '09. 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  Will gently hydrate one more day in hopes that renal function will improve a little more.  ACE I is on hold.  Will plan cath for tomorrow. 2.  Continue ASA/Plavix/statin/IV Heparin gtt/BB   Shawn Margarita, MD  07/18/2014  10:41 AM

## 2014-07-18 NOTE — Progress Notes (Signed)
ANTICOAGULATION CONSULT NOTE - Follow Up Consult  Pharmacy Consult for Heparin Indication: chest pain/ACS  Allergies  Allergen Reactions  . Codeine   . Morphine     REACTION: does not like    Patient Measurements: Height: 5\' 9"  (175.3 cm) Weight: 188 lb (85.276 kg) IBW/kg (Calculated) : 70.7 Heparin Dosing Weight:   Vital Signs: Temp: 98.6 F (37 C) (10/01 1347) Temp src: Oral (10/01 1347) BP: 157/68 mmHg (10/01 1347) Pulse Rate: 61 (10/01 1347)  Labs:  Recent Labs  07/16/14 1150 07/16/14 2253 07/17/14 0326 07/17/14 0413 07/17/14 0951 07/18/14 0611  HGB 12.5*  --   --  12.1*  --  13.8  HCT 36.0*  --   --  35.2*  --  40.1  PLT 151  --   --  123*  --  156  LABPROT 13.5 13.6  --  14.1  --   --   INR 1.03 1.04  --  1.09  --   --   HEPARINUNFRC  --  0.42  --  0.50  --  0.45  CREATININE 2.20*  --   --  2.01*  --  1.85*  TROPONINI  --  <0.30 <0.30  --  <0.30  --     Estimated Creatinine Clearance: 35 ml/min (by C-G formula based on Cr of 1.85).  Assessment: CC: CP and new LBBB  PMH: CHF, DM, HTN, CAD s/p CABD, prostate ca, GERD, OA, ulcer, HLD, CKD  AC: Heparin for CP -Heparin level 0.45 in goal. Enzymes negative. CBC stable. Holding cath for hydration and improvement in Scr.   Cards: AS, CAD s/p cABG 2009, HLD, HTN, ICM, (TG 181, HDL 29, LDL 32.) BP 157/68with HR 57-63. EF 50-55%. Meds: Norvasc10, ASA81, Lipitor80, Coreg, Plavix,  Endo: TSH 4.59 slightly elevated, DM on SSI. CBG 105-155  GI: GERD/PUD on PPI daily.  Renal: CRI with diabetic nephropathy. Scr down 1.85 slightly.  Neuro: Moderate bilateral ICA disease by doppler June 2015  Heme/Onc: h/o prostate cancer  Home meds held: Benazepril, Lasix, Imdur, Januvia, spironolactone.   Goal of Therapy:  Heparin level 0.3-0.7 units/ml Monitor platelets by anticoagulation protocol: Yes   Plan:  Cath possibly tomorrow. Eval for Synthroid Continue IV heparin 1100 units/hr   [name],  PharmD, [facility] Clinical Staff Pharmacist Pager [phone]  [name] 07/18/2014,2:11 PM

## 2014-07-19 ENCOUNTER — Encounter (HOSPITAL_COMMUNITY)
Admission: EM | Disposition: A | Discharge status: Home / Self Care | Payer: Self-pay | Source: Home / Self Care | Attending: Interventional Cardiology

## 2014-07-19 DIAGNOSIS — I255 Ischemic cardiomyopathy: Secondary | ICD-10-CM

## 2014-07-19 HISTORY — PX: LEFT HEART CATHETERIZATION WITH CORONARY ANGIOGRAM: SHX5451

## 2014-07-19 LAB — BASIC METABOLIC PANEL
ANION GAP: 12 (ref 5–15)
BUN: 21 mg/dL (ref 6–23)
CALCIUM: 9.4 mg/dL (ref 8.4–10.5)
CO2: 22 meq/L (ref 19–32)
CREATININE: 1.79 mg/dL — AB (ref 0.50–1.35)
Chloride: 107 mEq/L (ref 96–112)
GFR calc Af Amer: 40 mL/min — ABNORMAL LOW (ref >90)
GFR calc non Af Amer: 34 mL/min — ABNORMAL LOW (ref >90)
Glucose, Bld: 81 mg/dL (ref 70–99)
Potassium: 4.2 mEq/L (ref 3.7–5.3)
Sodium: 141 mEq/L (ref 137–147)

## 2014-07-19 LAB — CBC
HCT: 35 % — ABNORMAL LOW (ref 39.0–52.0)
Hemoglobin: 11.9 g/dL — ABNORMAL LOW (ref 13.0–17.0)
MCH: 30.8 pg (ref 26.0–34.0)
MCHC: 34 g/dL (ref 30.0–36.0)
MCV: 90.7 fL (ref 78.0–100.0)
Platelets: 134 10*3/uL — ABNORMAL LOW (ref 150–400)
RBC: 3.86 MIL/uL — ABNORMAL LOW (ref 4.22–5.81)
RDW: 13.1 % (ref 11.5–15.5)
WBC: 6.9 10*3/uL (ref 4.0–10.5)

## 2014-07-19 LAB — GLUCOSE, CAPILLARY
GLUCOSE-CAPILLARY: 119 mg/dL — AB (ref 70–99)
Glucose-Capillary: 110 mg/dL — ABNORMAL HIGH (ref 70–99)

## 2014-07-19 LAB — HEPARIN LEVEL (UNFRACTIONATED): Heparin Unfractionated: 0.35 IU/mL (ref 0.30–0.70)

## 2014-07-19 LAB — POCT ACTIVATED CLOTTING TIME
Activated Clotting Time: 225 seconds
Activated Clotting Time: 230 seconds
Activated Clotting Time: 152 seconds

## 2014-07-19 SURGERY — LEFT HEART CATHETERIZATION WITH CORONARY ANGIOGRAM
Anesthesia: LOCAL

## 2014-07-19 MED ORDER — NITROGLYCERIN 1 MG/10 ML FOR IR/CATH LAB
INTRA_ARTERIAL | Status: AC
  Filled 2014-07-19: qty 10

## 2014-07-19 MED ORDER — FENTANYL CITRATE 0.05 MG/ML IJ SOLN
INTRAMUSCULAR | Status: AC
  Filled 2014-07-19: qty 2

## 2014-07-19 MED ORDER — MIDAZOLAM HCL 2 MG/2ML IJ SOLN
INTRAMUSCULAR | Status: AC
  Filled 2014-07-19: qty 2

## 2014-07-19 MED ORDER — SODIUM CHLORIDE 0.9 % IJ SOLN: 3.0000 mL | Freq: Two times a day (BID) | INTRAMUSCULAR | Status: DC

## 2014-07-19 MED ORDER — SODIUM CHLORIDE 0.9 % IV SOLN: 1.0000 mL/kg/h | INTRAVENOUS | Status: AC

## 2014-07-19 MED ORDER — CLOPIDOGREL BISULFATE 300 MG PO TABS
ORAL_TABLET | ORAL | Status: AC
  Filled 2014-07-19: qty 1

## 2014-07-19 MED ORDER — HYDRALAZINE HCL 20 MG/ML IJ SOLN
10.0000 mg | INTRAMUSCULAR | Status: DC | PRN
  Administered 2014-07-19: 12:00:00 10 mg via INTRAVENOUS

## 2014-07-19 MED ORDER — HEPARIN (PORCINE) IN NACL 2-0.9 UNIT/ML-% IJ SOLN
INTRAMUSCULAR | Status: AC
  Filled 2014-07-19: qty 500

## 2014-07-19 MED ORDER — HEPARIN SODIUM (PORCINE) 1000 UNIT/ML IJ SOLN
INTRAMUSCULAR | Status: AC
  Filled 2014-07-19: qty 1

## 2014-07-19 MED ORDER — FENTANYL CITRATE 0.05 MG/ML IJ SOLN: 50.0000 ug | INTRAMUSCULAR | Status: DC | PRN

## 2014-07-19 MED ORDER — SODIUM CHLORIDE 0.9 % IV SOLN: 250.0000 mL | INTRAVENOUS | Status: DC | PRN

## 2014-07-19 MED ORDER — SODIUM CHLORIDE 0.9 % IJ SOLN: 3.0000 mL | INTRAMUSCULAR | Status: DC | PRN

## 2014-07-19 MED ORDER — HEPARIN (PORCINE) IN NACL 2-0.9 UNIT/ML-% IJ SOLN
INTRAMUSCULAR | Status: AC
  Filled 2014-07-19: qty 1500

## 2014-07-19 MED ORDER — LIDOCAINE HCL (PF) 1 % IJ SOLN
INTRAMUSCULAR | Status: AC
  Filled 2014-07-19: qty 30

## 2014-07-19 NOTE — CV Procedure (Addendum)
CARDIAC CATHETERIZATION AND PERCUTANEOUS CORONARY INTERVENTION REPORT  NAME:  [name]   MRN: [id number] DOB:  1933/09/04   ADMIT DATE: 07/16/2014 Procedure Date: 07/19/2014  INTERVENTIONAL CARDIOLOGIST: Leonie Man, M.D., MS PRIMARY CARE PROVIDER: [name], MD PRIMARY CARDIOLOGIST: [name], M.D.  PATIENT:  [name] is a 80 y.o. male with history of CABG in August of 2009. He had LIMA-LAD, SVG-PDA, SVG-LPL, SVG-OM. 4 months later he underwent PCI to the anastomosis of the SVG-OM with a Xience DES was 2.5 mm in diameter. He then had recurrence of unstable angina in October 2010 he had distal H. stenosis of the DES stent placed in 2009. He then had a second Xience DES 2.5 mm x 20 mm stent placed in the anastomosis a post dilated to 3.0 mm balloon. He presents [name] emergency room on September 29 with chest pain concerning for unstable angina. He received IV fluid hydration for elevated creatinine/worsening kidney function. Based on his presentation he is referred for invasive evaluation with possible PCI.  PRE-OPERATIVE DIAGNOSIS:    Unstable angina  Known CAD-CABG with PCI of the SVG to OM  PROCEDURES PERFORMED:    Left Heart Catheterization with Native Coronary and graft Angiography  via Right Common Femoral Artery   AngioSculpt PTCA of 90% in-stent restenosis in the SVG-OM anastomosis -- reduced to less than 20% stenosis  PROCEDURE: The patient was brought to the 2nd [place] Cardiac Catheterization Lab in the fasting state and prepped and draped in the usual sterile fashion for Right Common Femoral artery access. In an effort to conserve contrast for graft case, I decided to use the femoral approach. Sterile technique was used including antiseptics, cap, gloves, gown, hand hygiene, mask and sheet. Skin prep: Chlorhexidine.   Consent: Risks of procedure as well as the alternatives and risks of each were explained to the (patient/caregiver).  Consent for procedure obtained.   Time Out: Verified patient identification, verified procedure, site/side was marked, verified correct patient position, special equipment/implants available, medications/allergies/relevent history reviewed, required imaging and test results available. Performed.  Access:   Right Common Femoral Artery: 5 Fr Sheath -  fluoroscopically guided modified Seldinger Technique  Left Heart Catheterization: 5Fr Catheters advanced or exchanged over a standard J-wire; JL4 catheter advanced first.  Left Coronary Artery Cineangiography: JL4 Catheter  Right Coronary Artery, SVG-RCA & SVG-D2 Cineangiography: JR4 Catheter  LIMA-LAD Cineangiography: JR Catheter redirected into Left Subclavian Artery & exchanged over long-exchange wire for IMA catheter. SVG-OM Cineangiography: AL1  LV Hemodynamics (LV Gram): AL1 with straight wire  Sheath will removed in the 6 C post-procedure unit with manual pressure for hemostasis.   FINDINGS:  Hemodynamics:   Central Aortic Pressure / Mean: 158/61/98 mmHg  Left Ventricular Pressure / LVEDP: 159/7/10 mmHg  Left Ventriculography: Not performed  Coronary Anatomy:  Dominance: Right PDA  Left Main: Normal caliber vessel with mild calcification. Angiographically no stenosis. Branches into the LAD, Complex and a Small Ramus Intermedius LAD: Normal caliber vessel that gives rise to several septal perforators. There are 2 small caliber diagonal branches with a 90% LAD stenosis just after D1. A smaller branch. There is then a more distal T3 that is relatively large in diameter. Beyond D3 there is competitive flow noted in the distal LAD.  Left Circumflex: Normal caliber vessel with proximal 20% lesion. In the mid segment there is an OM`1 that appears to be occluded a stent in place. The remainder of the circumflex gives off a small  M2 and courses in the AV groove where there are several posterolateral branches. One of the posterolateral  branches appears to be occluded or with competitive flow.  There does appear to be a 70% lesion at the takeoff of the small OM 2  Ramus intermedius: Relatively small-caliber vessel is also short.  RCA: 100% occluded at the ostium  Graft Anatomy:  LIMA-LAD: widely patent with 30-40% stenosis in the LAD beyond the anastomosis. The LAD itself was noted to be free of disease otherwise. Retrograde flow goes minimally upstream to a diagonal branch.  SVG-RPDA: Widely patent with a large caliber PDA noted distally. There is an apparent 70% stenosis with retrograde flow going up toward the native right. There is minimal posterior system  SVG-LPL: Widely patent with 2 branches of the posterolateral being filled via the graft and retrograde flow going back up to the native circumflex OM a smaller posterior lateral branch and the terminal circumflex.    SVG-OM: Widely patent graft until the anastomotic stent where there is a focal, hazy appearing 95% in-stent re-stenosis in a sharp bend of the graft at the anastomosis. The remainder of the graft and target vessels are relatively free of disease.  After reviewing the initial angiography, the culprit lesion was thought to be 90-95% ISR in the SVG-OM Anastomotic Xience DES Stent.  Preparation were made to proceed with PTCA on this lesion.  Percutaneous Coronary Intervention:  Sheath exchanged for 6 Fr Guide: 6 Fr   AL1 Guidewire: BMW3 Predilation Balloon: Mini Trek 2.0 mm x 12 mm; (Unable to cross pre & post dilation with Flextome 3.0 mm x 10 mm balloon)  14 Atm x 20 Sec Post-dilation Balloon: [employer] Angiosculpt Balloon 3.0 mm x 10 mm;   16 Atm x 60 Sec  Final Diameter: 3.25 -- residual stenosis < 20 %  Post deployment angiography in multiple views, with and without guidewire in place revealed excellent expansion of the in-stent stenosis.  There was no evidence of dissection or perforation.  MEDICATIONS:  Anesthesia:  Local Lidocaine 16  ml  Sedation:  6 mg IV Versed, 125 mcg IV fentanyl ;   Omnipaque Contrast: 150 ml  Anticoagulation:  IV Heparin 8000 Units  Anti-Platelet Agent:  3000  PATIENT DISPOSITION:    The patient was transferred to the PACU holding area in a hemodynamicaly stable, chest pain free condition.  The patient tolerated the procedure well, and there were no complications.  EBL:   < 20 ml  The patient was stable before, during, and after the procedure.  POST-OPERATIVE DIAGNOSIS:    Severe 3 Vessel CAD with 4/4 grafts patent,but ~90% anastomotic stent ISR in Xience DES 2.5 mm stent to SVG-OM.  Preserved EF by Echo, normal LVEDP with relatively normal Ao Valve gradient  Successful AngioSculpt PTCA of ISR of SVG-OM with 3.0 mm balloon.  PLAN OF CARE:  Overnight monitoring in post-procedure unit  Re-initiate DAPT (would consider 1 yr as presentation was essentially ACS).  Continue Medical management of HTN, HLD & DM  Anticipate d/c in AM if stable.    Leonie Man, M.D., M.S. Interventional Cardiologist   Pager # [phone]

## 2014-07-19 NOTE — Progress Notes (Signed)
Site area: right groin  Site Prior to Removal:  Level 0  Pressure Applied For 20 MINUTES    Minutes Beginning at 1315  Manual:   Yes.    Patient Status During Pull:  stable  Post Pull Groin Site:  Level 0  Post Pull Instructions Given:  Yes.    Post Pull Pulses Present:  Yes.    Dressing Applied:  Yes.    Comments:

## 2014-07-19 NOTE — Interval H&P Note (Signed)
History and Physical Interval Note:  07/19/2014 7:52 AM  [name]  has presented today for surgery, with the diagnosis of Unstable Angina. The various methods of treatment have been discussed with the patient and family.  After consideration of risks, benefits and other options for treatment, the patient has consented to  Procedure(s): LEFT HEART CATHETERIZATION WITH CORONARY ANGIOGRAM (N/A) +/- PCI as a surgical intervention .  The patient's history has been reviewed, patient examined, no change in status, stable for surgery.  I have reviewed the patient's chart and labs.  Questions were answered to the patient's satisfaction.     HARDING,DAVID W  Cath Lab Visit (complete for each Cath Lab visit)  Clinical Evaluation Leading to the Procedure:   ACS: Yes.    Non-ACS:    Anginal Classification: CCS III  Anti-ischemic medical therapy: Maximal Therapy (2 or more classes of medications)  Non-Invasive Test Results: Low-risk stress test findings: cardiac mortality <1%/year - in Jan 2015  Prior CABG: Previous CABG  Leonie Man, M.D., M.S. Interventional Cardiologist   Pager # [phone]

## 2014-07-19 NOTE — H&P (View-Only) (Signed)
SUBJECTIVE:  No further CP or SOB  OBJECTIVE:   Vitals:   Filed Vitals:   07/17/14 1501 07/17/14 2100 07/18/14 0457 07/18/14 0500  BP: 143/56 145/67  159/58  Pulse: 59 63  57  Temp: 98 F (36.7 C) 98.6 F (37 C)  98.3 F (36.8 C)  TempSrc: Oral Oral  Oral  Resp: 16 18  18   Height:      Weight:   188 lb (85.276 kg)   SpO2: 98% 99%  99%   I&O's:   Intake/Output Summary (Last 24 hours) at 07/18/14 1041 Last data filed at 07/17/14 1900  Gross per 24 hour  Intake   1212 ml  Output      0 ml  Net   1212 ml   TELEMETRY: Reviewed telemetry pt in NSR:  PHYSICAL EXAM General: Well developed, well nourished, in no acute distress Head: Eyes PERRLA, No xanthomas.   Normal cephalic and atramatic  Lungs:   Clear bilaterally to auscultation and percussion.2/6 SM at RUSB to LLSB Heart:   HRRR S1 S2 Pulses are 2+ & equal. Abdomen: Bowel sounds are positive, abdomen soft and non-tender without masses  Extremities:   No clubbing, cyanosis or edema.  DP +1 Neuro: Alert and oriented X 3. Psych:  Good affect, responds appropriately   LABS: Basic Metabolic Panel:  Recent Labs  07/16/14 2253 07/17/14 0413 07/18/14 0611  NA  --  140 142  K  --  4.2 4.3  CL  --  105 104  CO2  --  20 24  GLUCOSE  --  124* 104*  BUN  --  32* 24*  CREATININE  --  2.01* 1.85*  CALCIUM  --  9.2 9.9  MG 1.9  --   --    Liver Function Tests:  Recent Labs  07/17/14 0413  AST 14  ALT 14  ALKPHOS 87  BILITOT 0.7  PROT 7.7  ALBUMIN 3.9   No results found for this basename: LIPASE, AMYLASE,  in the last 72 hours CBC:  Recent Labs  07/17/14 0413 07/18/14 0611  WBC 7.0 8.4  HGB 12.1* 13.8  HCT 35.2* 40.1  MCV 91.2 90.9  PLT 123* 156   Cardiac Enzymes:  Recent Labs  07/16/14 2253 07/17/14 0326 07/17/14 0951  TROPONINI <0.30 <0.30 <0.30   BNP: No components found with this basename: POCBNP,  D-Dimer: No results found for this basename: DDIMER,  in the last 72 hours Hemoglobin  A1C:  Recent Labs  07/16/14 2253  HGBA1C 5.5   Fasting Lipid Panel:  Recent Labs  07/17/14 0450  CHOL 97  HDL 29*  LDLCALC 32  TRIG 181*  CHOLHDL 3.3   Thyroid Function Tests:  Recent Labs  07/16/14 2253  TSH 4.590*   Anemia Panel: No results found for this basename: VITAMINB12, FOLATE, FERRITIN, TIBC, IRON, RETICCTPCT,  in the last 72 hours Coag Panel:   Lab Results  Component Value Date   INR 1.09 07/17/2014   INR 1.04 07/16/2014   INR 1.03 07/16/2014    RADIOLOGY: Dg Chest 2 View  07/16/2014   CLINICAL DATA:  Left-sided chest pain extending from neck into arm with shortness of breath. History of diabetes and hypertension.  EXAM: CHEST  2 VIEW  COMPARISON:  02/24/2010 and 10/12/2009.  FINDINGS: The heart size and mediastinal contours are stable status post median sternotomy and CABG. There is aortic and coronary artery atherosclerosis with possible coronary artery stents. There is stable mild scarring at  both lung bases. No edema, confluent airspace opacity or pleural effusion is seen. Degenerative changes are present throughout the spine.  IMPRESSION: Stable chest with mild cardiomegaly status post CABG. No acute cardiopulmonary process.   Electronically Signed   By: Camie Patience M.D.   On: 07/16/2014 17:48   US Renal  07/08/2014   CLINICAL DATA:  Acute on chronic renal failure.  EXAM: RENAL/URINARY TRACT ULTRASOUND COMPLETE  COMPARISON:  CT 05/10/2012  FINDINGS: Right Kidney:  Length: 11.1 cm. Cortical thinning. Normal echotexture. No hydronephrosis.  Left Kidney:  Length: 11.7 cm. Cortical thinning. No hydronephrosis. Normal echotexture. 11 mm nonobstructing stone in the midpole of the left kidney as seen on prior CT.  Bladder:  Appears normal for degree of bladder distention.  IMPRESSION: Cortical thinning bilaterally.  No hydronephrosis.  Left nephrolithiasis.   Electronically Signed   By: Rolm Baptise M.D.   On: 07/08/2014 10:19   Assessment/Plan:  80 y.o. male with a  history of CAD s/p CABG x4 (2009) and multiple subsequent stents, last PCI 2010, Myoview Jan 2015 was low risk, chronic LBBB, chronic dyspnea, ischemic CM now resolved, mild AS, diastolic dysfunction, CKD stage 3, diabetes, HLD, HTN, history of prostate cancer, and GERD with previous ulcer of esophagus who presented to [facility] ED 07/16/14 with chest pain and DOE. His Scr was noted to be elevated above his baseline. He was admitted, started on Heparin. His ACE and diuretics have been held, and IVF started. Troponin is negative, CXR does not show CHF.  Principal Problem:  Chest pain with high risk of acute coronary syndrome  Active Problems:  CAD- CABG x 4 '09. 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  Will gently hydrate one more day in hopes that renal function will improve a little more.  ACE I is on hold.  Will plan cath for tomorrow. 2.  Continue ASA/Plavix/statin/IV Heparin gtt/BB   Sueanne Margarita, MD  07/18/2014  10:41 AM

## 2014-07-20 ENCOUNTER — Other Ambulatory Visit: Payer: Self-pay | Admitting: Cardiology

## 2014-07-20 LAB — BASIC METABOLIC PANEL
Anion gap: 12 (ref 5–15)
BUN: 15 mg/dL (ref 6–23)
CALCIUM: 9.4 mg/dL (ref 8.4–10.5)
CO2: 21 mEq/L (ref 19–32)
CREATININE: 1.65 mg/dL — AB (ref 0.50–1.35)
Chloride: 105 mEq/L (ref 96–112)
GFR calc non Af Amer: 38 mL/min — ABNORMAL LOW (ref >90)
GFR, EST AFRICAN AMERICAN: 44 mL/min — AB (ref >90)
Glucose, Bld: 146 mg/dL — ABNORMAL HIGH (ref 70–99)
Potassium: 4.2 mEq/L (ref 3.7–5.3)
Sodium: 138 mEq/L (ref 137–147)

## 2014-07-20 LAB — CBC
HCT: 33.2 % — ABNORMAL LOW (ref 39.0–52.0)
Hemoglobin: 11.5 g/dL — ABNORMAL LOW (ref 13.0–17.0)
MCH: 30.7 pg (ref 26.0–34.0)
MCHC: 34.6 g/dL (ref 30.0–36.0)
MCV: 88.5 fL (ref 78.0–100.0)
PLATELETS: 127 10*3/uL — AB (ref 150–400)
RBC: 3.75 MIL/uL — ABNORMAL LOW (ref 4.22–5.81)
RDW: 13 % (ref 11.5–15.5)
WBC: 11.1 10*3/uL — AB (ref 4.0–10.5)

## 2014-07-20 LAB — GLUCOSE, CAPILLARY: GLUCOSE-CAPILLARY: 116 mg/dL — AB (ref 70–99)

## 2014-07-20 MED ORDER — CARVEDILOL 12.5 MG PO TABS: 12.5000 mg | ORAL_TABLET | Freq: Two times a day (BID) | ORAL | Status: DC

## 2014-07-20 MED ORDER — CARVEDILOL 12.5 MG PO TABS
12.5000 mg | ORAL_TABLET | Freq: Two times a day (BID) | ORAL | Status: DC
  Administered 2014-07-20: 12.5 mg via ORAL

## 2014-07-20 MED ORDER — ASPIRIN 81 MG PO TBEC: 81.0000 mg | DELAYED_RELEASE_TABLET | Freq: Every day | ORAL | Status: DC

## 2014-07-20 NOTE — Discharge Summary (Signed)
Physician Discharge Summary     Cardiologist:  Hochrein  Patient ID: [name] MRN: [id number] DOB/AGE: December 25, 1932 80 y.o.  Admit date: 07/16/2014 Discharge date: 07/20/2014  Admission Diagnoses:  Unstable angina  Discharge Diagnoses:  Principal Problem:   Unstable angina Active Problems:   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   Dyslipidemia   HTN   CAD- CABG x 4 '09. Last PCI was DES to SVG-OM for ISR 2010   Ischemic cardiomyopathy- last EF Normal Myoview Jan 2015   Moderate bilateral ICA disease by doppler June 2015   Hx GERD/PUD   Hx of Prostate ca   DOE (dyspnea on exertion)   Acute on chronic renal insufficiency- ACE and diuretics held   Diastolic dysfunction- moderate on echo 2013   Aortic stenosis, mild by echo 2013   LBBB (left bundle branch block)   Chest pain   Discharged Condition: stable  Hospital Course:   [name] is a 80 y.o. male with a history of CAD s/p CABG x4 (2009) and multiple subsequent stents, chronic LBBB, chronic dyspnea, ischemic CM now resolved, CKD, diabetes, HLD, HTN, history of prostate cancer, and GERD with previous ulcer of esophagus who presented to [facility] ED today with chest pain.    He was recently noted to have worsening kidney function with GFR less than 25 and his metformin was held and his benazepril reduced. Recently taken off his statin for extreme myalgias, but then resumed on Crestor. Dr. Percival Spanish ordered a BNP in the office for evaluation of chronic dyspnea which returned normal. He recently underwent nuclear stress test in 10/2013 which was "low risk with inferior diaphragmatic attenuation artifact. Cannot exclude a component of inferior wall scar, but ischemia is not observed. LVEF: 55%. Mild inferoseptal hypokinesis. Postoperative paradoxical septal motion is seen."   He presented to the [facility] today complaining of intermittent chest pain that was becoming more frequent and severe over the past month. It  came on both with exertion and rest and lasted up to 10 minutes easing off on its own. The chest pain was a pressure that radiated to his left jaw/teeth and left arm and is associated with nausea and worsening SOB. He has chronic dyspnea but feels like it may be worse lately. No LE edema. Chronic orthopnea. No PND. He recently saw a family practice MD at the [place] where he gets his annual physicals. She apparently told him he "had a blockage" by looking at his EKG in the office.(He does have a chronic LBBB?) This upset him and he presented to Dr. Rosezella [place] office for further evaluation. He reported continued and worsened chest pain and he was told to present to the ED.   He was admitted and placed on a heparin drip.  ASA, plavix, coreg, Norvasc and crestor were continued.  Benazepril and lasix was discontinued because of CKD and need for cath.  HE was gently hydrated for two days with modest improvement in SCr.  He ruled out for MI.  LEft heart cath on 10/2 revealed severe 3 Vessel CAD with 4/4 grafts patent,but ~90% anastomotic stent ISR in Xience DES 2.5 mm stent to SVG-OM. Preserved EF by Echo, normal LVEDP with relatively normal Ao Valve gradient.  He underwent successful AngioSculpt PTCA of ISR of SVG-OM with 3.0 mm balloon.  Dual antiplatelets for a year with crestor(if tolerated) and Coreg 12.5 twice daily which was increased due to HTN.  Holding ACE-I and spironolactone.  Echo also revealed grade 2  diastolic dysfunction.  Will add back $Remove'40mg'BWwjAvW$  lasix daily. The patient was seen by Dr. Harl Bowie who felt he was stable for DC home.   Follow UP:  BMET Consults: None  Significant Diagnostic Studies:  Lipid Panel     Component Value Date/Time   CHOL 97 07/17/2014 0450   TRIG 181* 07/17/2014 0450   HDL 29* 07/17/2014 0450   CHOLHDL 3.3 07/17/2014 0450   VLDL 36 07/17/2014 0450   LDLCALC 32 07/17/2014 0450   LDLDIRECT 143.3 11/23/2007 0000   LDLDIRECT 175.0 09/27/2006 1154      CARDIAC CATHETERIZATION AND  PERCUTANEOUS CORONARY INTERVENTION REPORT  NAME: [name] MRN: [id number]  DOB: 12-16-1932 ADMIT DATE: 07/16/2014  Procedure Date: 07/19/2014  INTERVENTIONAL CARDIOLOGIST: Leonie Man, M.D., MS  PRIMARY CARE PROVIDER: [name], MD  PRIMARY CARDIOLOGIST: [name], M.D.  PATIENT: [name] is a 80 y.o. male with history of CABG in August of 2009. He had LIMA-LAD, SVG-PDA, SVG-LPL, SVG-OM. 4 months later he underwent PCI to the anastomosis of the SVG-OM with a Xience DES was 2.5 mm in diameter. He then had recurrence of unstable angina in October 2010 he had distal H. stenosis of the DES stent placed in 2009. He then had a second Xience DES 2.5 mm x 20 mm stent placed in the anastomosis a post dilated to 3.0 mm balloon.  He presents [name] emergency room on September 29 with chest pain concerning for unstable angina.  He received IV fluid hydration for elevated creatinine/worsening kidney function.  Based on his presentation he is referred for invasive evaluation with possible PCI.  PRE-OPERATIVE DIAGNOSIS:  Unstable angina  Known CAD-CABG with PCI of the SVG to OM PROCEDURES PERFORMED:  Left Heart Catheterization with Native Coronary and graft Angiography via Right Common Femoral Artery  AngioSculpt PTCA of 90% in-stent restenosis in the SVG-OM anastomosis -- reduced to less than 20% stenosis PROCEDURE: The patient was brought to the [facility] in the fasting state and prepped and draped in the usual sterile fashion for Right Common Femoral artery access. In an effort to conserve contrast for graft case, I decided to use the femoral approach. Sterile technique was used including antiseptics, cap, gloves, gown, hand hygiene, mask and sheet. Skin prep: Chlorhexidine.  Consent: Risks of procedure as well as the alternatives and risks of each were explained to the (patient/caregiver). Consent for procedure obtained.  Time Out: Verified  patient identification, verified procedure, site/side was marked, verified correct patient position, special equipment/implants available, medications/allergies/relevent history reviewed, required imaging and test results available. Performed.  Access:  Right Common Femoral Artery: 5 Fr Sheath - fluoroscopically guided modified Seldinger Technique Left Heart Catheterization: 5Fr Catheters advanced or exchanged over a standard J-wire; JL4 catheter advanced first.  Left Coronary Artery Cineangiography: JL4 Catheter  Right Coronary Artery, SVG-RCA & SVG-D2 Cineangiography: JR4 Catheter  LIMA-LAD Cineangiography: JR Catheter redirected into Left Subclavian Artery & exchanged over long-exchange wire for IMA catheter.  SVG-OM Cineangiography: AL1 LV Hemodynamics (LV Gram): AL1 with straight wire Sheath will removed in the 6 C post-procedure unit with manual pressure for hemostasis.  FINDINGS:  Hemodynamics:  Central Aortic Pressure / Mean: 158/61/98 mmHg  Left Ventricular Pressure / LVEDP: 159/7/10 mmHg Left Ventriculography: Not performed  Coronary Anatomy:  Dominance: Right PDA Left Main: Normal caliber vessel with mild calcification. Angiographically no stenosis. Branches into the LAD, Complex and a Small Ramus Intermedius LAD: Normal caliber vessel that gives rise to  several septal perforators. There are 2 small caliber diagonal branches with a 90% LAD stenosis just after D1. A smaller branch. There is then a more distal T3 that is relatively large in diameter. Beyond D3 there is competitive flow noted in the distal LAD.  Left Circumflex: Normal caliber vessel with proximal 20% lesion. In the mid segment there is an OM`1 that appears to be occluded a stent in place. The remainder of the circumflex gives off a small M2 and courses in the AV groove where there are several posterolateral branches. One of the posterolateral branches appears to be occluded or with competitive flow. There does appear to  be a 70% lesion at the takeoff of the small OM 2  Ramus intermedius: Relatively small-caliber vessel is also short.  RCA: 100% occluded at the ostium Graft Anatomy:  LIMA-LAD: widely patent with 30-40% stenosis in the LAD beyond the anastomosis. The LAD itself was noted to be free of disease otherwise. Retrograde flow goes minimally upstream to a diagonal branch.  SVG-RPDA: Widely patent with a large caliber PDA noted distally. There is an apparent 70% stenosis with retrograde flow going up toward the native right. There is minimal posterior system  SVG-LPL: Widely patent with 2 branches of the posterolateral being filled via the graft and retrograde flow going back up to the native circumflex OM a smaller posterior lateral branch and the terminal circumflex.  SVG-OM: Widely patent graft until the anastomotic stent where there is a focal, hazy appearing 95% in-stent re-stenosis in a sharp bend of the graft at the anastomosis. The remainder of the graft and target vessels are relatively free of disease. After reviewing the initial angiography, the culprit lesion was thought to be 90-95% ISR in the SVG-OM Anastomotic Xience DES Stent. Preparation were made to proceed with PTCA on this lesion.  Percutaneous Coronary Intervention: Sheath exchanged for 6 Fr  Guide: 6 Fr AL1 Guidewire: BMW3  Predilation Balloon: Mini Trek 2.0 mm x 12 mm; (Unable to cross pre & post dilation with Flextome 3.0 mm x 10 mm balloon)  14 Atm x 20 Sec Post-dilation Balloon: [employer] Angiosculpt Balloon 3.0 mm x 10 mm;  16 Atm x 60 Sec  Final Diameter: 3.25 -- residual stenosis < 20 % Post deployment angiography in multiple views, with and without guidewire in place revealed excellent expansion of the in-stent stenosis. There was no evidence of dissection or perforation.  MEDICATIONS:  Anesthesia: Local Lidocaine 16 ml Sedation: 6 mg IV Versed, 125 mcg IV fentanyl ;  Omnipaque Contrast: 150 ml  Anticoagulation: IV  Heparin 8000 Units  Anti-Platelet Agent: 3000 PATIENT DISPOSITION:  The patient was transferred to the PACU holding area in a hemodynamicaly stable, chest pain free condition.  The patient tolerated the procedure well, and there were no complications. EBL: < 20 ml  The patient was stable before, during, and after the procedure. POST-OPERATIVE DIAGNOSIS:  Severe 3 Vessel CAD with 4/4 grafts patent,but ~90% anastomotic stent ISR in Xience DES 2.5 mm stent to SVG-OM.  Preserved EF by Echo, normal LVEDP with relatively normal Ao Valve gradient  Successful AngioSculpt PTCA of ISR of SVG-OM with 3.0 mm balloon. PLAN OF CARE:  Overnight monitoring in post-procedure unit  Re-initiate DAPT (would consider 1 yr as presentation was essentially ACS).  Continue Medical management of HTN, HLD & DM  Anticipate d/c in AM if stable. Leonie Man, M.D., M.S.  Study Conclusions  - Left ventricle: The cavity size was normal. There was moderate concentric  hypertrophy. Systolic function was normal. The estimated ejection fraction was in the range of 50% to 55%. Wall motion was normal; there were no regional wall motion abnormalities. Features are consistent with a pseudonormal left ventricular filling pattern, with concomitant abnormal relaxation and increased filling pressure (grade 2 diastolic dysfunction). Doppler parameters are consistent with elevated ventricular end-diastolic filling pressure. - Aortic valve: Trileaflet; moderately thickened, moderately calcified leaflets. Cusp separation was moderately reduced. Valve area (VTI): 2.82 cm^2. Valve area (Vmax): 3.22 cm^2. Valve area (Vmean): 2.79 cm^2. - Mitral valve: There was no regurgitation. - Left atrium: The atrium was mildly dilated. - Right ventricle: Systolic function was normal. - Tricuspid valve: There was mild regurgitation. - Pulmonary arteries: Systolic pressure was within the normal range.  Impressions:  - Aortic valve is  moderately calcified and thickened with restricted leaflet opening. However on the current study the transaortic gradients are only minimally elevated and most probably underestimated, only acquired in 1 view. An alternative study such as TEE should be performed if clinically indicated.    Treatments: See Above  Discharge Exam: Blood pressure 172/53, pulse 69, temperature 99.1 F (37.3 C), temperature source Oral, resp. rate 18, height $RemoveBe'5\' 9"'qBuiuGDPb$  (1.753 m), weight 189 lb 9.5 oz (86 kg), SpO2 98.00%.   Disposition: 01-Home or Self Care      Discharge Instructions   Diet - low sodium heart healthy    Complete by:  As directed      Discharge instructions    Complete by:  As directed   No lifting more than a half gallon of milk or driving for three days.     Increase activity slowly    Complete by:  As directed             Medication List    STOP taking these medications       benazepril 40 MG tablet  Commonly known as:  LOTENSIN     isosorbide mononitrate 60 MG 24 hr tablet  Commonly known as:  IMDUR     spironolactone 25 MG tablet  Commonly known as:  ALDACTONE      TAKE these medications       ALPRAZolam 1 MG tablet  Commonly known as:  XANAX  Take 1 tablet (1 mg total) by mouth 3 (three) times daily as needed.     amLODipine 10 MG tablet  Commonly known as:  NORVASC  TAKE 1 TABLET EVERY DAY     aspirin 81 MG EC tablet  Take 1 tablet (81 mg total) by mouth daily.     carvedilol 12.5 MG tablet  Commonly known as:  COREG  Take 1 tablet (12.5 mg total) by mouth 2 (two) times daily with a meal.     clopidogrel 75 MG tablet  Commonly known as:  PLAVIX  Take 75 mg by mouth daily.     freestyle lancets  1 each by Other route as needed. Use as instructed     FREESTYLE LITE test strip  Generic drug:  glucose blood  1 each by Other route as needed. Use as instructed     furosemide 40 MG tablet  Commonly known as:  LASIX  TAKE 1 TABLET (40 MG TOTAL) BY MOUTH  DAILY.     JANUVIA 50 MG tablet  Generic drug:  sitaGLIPtin  TAKE 1 TABLET BY MOUTH EVERY DAY     NITROSTAT 0.4 MG SL tablet  Generic drug:  nitroGLYCERIN  TAKE AS DIRECTED     pantoprazole  40 MG tablet  Commonly known as:  PROTONIX  Take 40 mg by mouth daily.     rosuvastatin 20 MG tablet  Commonly known as:  CRESTOR  Take 1 tablet (20 mg total) by mouth daily.     traMADol 50 MG tablet  Commonly known as:  ULTRAM  Take 1 tablet (50 mg total) by mouth every 6 (six) hours as needed. 1-2 every 6 h prn pain       Follow-up Information   Follow up with [name], MD. (The office scheduler will call you next week with the follow up appt date. )    Specialty:  Cardiology   Contact information:   [address] [place] [address] [phone]      Greater than 30 minutes was spent completing the patient's discharge.    SignedTarri Fuller, [place] 07/20/2014, 7:55 AM

## 2014-07-20 NOTE — Progress Notes (Signed)
CARDIAC REHAB PHASE I   PRE:  Rate/Rhythm: 74 SR    BP: sitting 172/53    SaO2:   MODE:  Ambulation: 600 ft   POST:  Rate/Rhythm: 88 SR with PVCs    BP: sitting 178/59     SaO2:   Tolerated well. Some slight SOB/wheeze. Pt denies so apparently improved. BP elevated. Ed completed. Pt admittedly only complies with diet and ex when his wife makes him. Reinforced NTG, calling MD if he has sx. Not interested in CRPII. [place], [place], ACSM 07/20/2014 8:26 AM

## 2014-07-20 NOTE — Progress Notes (Signed)
Patient ID: [name], male   DOB: 11/16/1932, 80 y.o.   MRN: [id number]     Subjective:    No complaints this AM. No chest pain.   Objective:   Temp:  [97.8 F (36.6 C)-98.7 F (37.1 C)] 98.4 F (36.9 C) (10/03 0425) Pulse Rate:  [58-73] 63 (10/03 0425) Resp:  [18] 18 (10/03 0425) BP: (117-200)/(39-93) 150/54 mmHg (10/03 0425) SpO2:  [96 %-99 %] 96 % (10/03 0425) Weight:  [189 lb 9.5 oz (86 kg)] 189 lb 9.5 oz (86 kg) (10/03 0500) Last BM Date: 07/17/14  Filed Weights   07/19/14 0500 07/20/14 0000 07/20/14 0500  Weight: 187 lb 8 oz (85.049 kg) 189 lb 9.5 oz (86 kg) 189 lb 9.5 oz (86 kg)    Intake/Output Summary (Last 24 hours) at 07/20/14 0706 Last data filed at 07/20/14 0426  Gross per 24 hour  Intake 1078.58 ml  Output    900 ml  Net 178.58 ml    Telemetry: SR with LBBB  Exam:  General: NAD  HEENT: sclera clear  Resp: CTAB  Cardiac: RRR, no 2/6 systolic murmur RUSB,  no JVD, no carotid bruits  GI: abdomen soft, NT, ND  MSK: right groin soft, non-painful, no audible bruit  Neuro: no focal deficits  Psych: appropriate affect  Lab Results:  Basic Metabolic Panel:  Recent Labs Lab 07/16/14 2253  07/18/14 0611 07/19/14 0500 07/20/14 0412  NA  --   < > 142 141 138  K  --   < > 4.3 4.2 4.2  CL  --   < > 104 107 105  CO2  --   < > $R'24 22 21  'WA$ GLUCOSE  --   < > 104* 81 146*  BUN  --   < > 24* 21 15  CREATININE  --   < > 1.85* 1.79* 1.65*  CALCIUM  --   < > 9.9 9.4 9.4  MG 1.9  --   --   --   --   < > = values in this interval not displayed.  Liver Function Tests:  Recent Labs Lab 07/17/14 0413  AST 14  ALT 14  ALKPHOS 87  BILITOT 0.7  PROT 7.7  ALBUMIN 3.9    CBC:  Recent Labs Lab 07/18/14 0611 07/19/14 0500 07/20/14 0412  WBC 8.4 6.9 11.1*  HGB 13.8 11.9* 11.5*  HCT 40.1 35.0* 33.2*  MCV 90.9 90.7 88.5  PLT 156 134* 127*    Cardiac Enzymes:  Recent Labs Lab 07/16/14 2253 07/17/14 0326 07/17/14 0951  TROPONINI <0.30  <0.30 <0.30    BNP:  Recent Labs  10/16/13 1309  PROBNP 101.0*    Coagulation:  Recent Labs Lab 07/16/14 1150 07/16/14 2253 07/17/14 0413  INR 1.03 1.04 1.09    ECG:   Medications:   Scheduled Medications: . amLODipine  10 mg Oral Daily  . aspirin EC  81 mg Oral Daily  . atorvastatin  80 mg Oral q1800  . carvedilol  6.25 mg Oral BID WC  . clopidogrel  75 mg Oral Daily  . insulin aspart  0-15 Units Subcutaneous TID WC  . pantoprazole  40 mg Oral Daily  . sodium chloride  3 mL Intravenous Q12H     Infusions:     PRN Medications:  sodium chloride, acetaminophen, ALPRAZolam, fentaNYL, hydrALAZINE, nitroGLYCERIN, ondansetron (ZOFRAN) IV, sodium chloride   07/19/14 Cath Hemodynamics:  Central Aortic Pressure / Mean: 158/61/98 mmHg  Left Ventricular Pressure / LVEDP: 159/7/10  mmHg Left Ventriculography: Not performed  Coronary Anatomy:  Dominance: Right PDA Left Main: Normal caliber vessel with mild calcification. Angiographically no stenosis. Branches into the LAD, Complex and a Small Ramus Intermedius LAD: Normal caliber vessel that gives rise to several septal perforators. There are 2 small caliber diagonal branches with a 90% LAD stenosis just after D1. A smaller Sicilia Killough. There is then a more distal T3 that is relatively large in diameter. Beyond D3 there is competitive flow noted in the distal LAD.  Left Circumflex: Normal caliber vessel with proximal 20% lesion. In the mid segment there is an OM`1 that appears to be occluded a stent in place. The remainder of the circumflex gives off a small M2 and courses in the AV groove where there are several posterolateral branches. One of the posterolateral branches appears to be occluded or with competitive flow. There does appear to be a 70% lesion at the takeoff of the small OM 2  Ramus intermedius: Relatively small-caliber vessel is also short.  RCA: 100% occluded at the ostium Graft Anatomy:  LIMA-LAD: widely patent  with 30-40% stenosis in the LAD beyond the anastomosis. The LAD itself was noted to be free of disease otherwise. Retrograde flow goes minimally upstream to a diagonal Lenda Baratta.  SVG-RPDA: Widely patent with a large caliber PDA noted distally. There is an apparent 70% stenosis with retrograde flow going up toward the native right. There is minimal posterior system  SVG-LPL: Widely patent with 2 branches of the posterolateral being filled via the graft and retrograde flow going back up to the native circumflex OM a smaller posterior lateral Lorynn Moeser and the terminal circumflex.  SVG-OM: Widely patent graft until the anastomotic stent where there is a focal, hazy appearing 95% in-stent re-stenosis in a sharp bend of the graft at the anastomosis. The remainder of the graft and target vessels are relatively free of disease. After reviewing the initial angiography, the culprit lesion was thought to be 90-95% ISR in the SVG-OM Anastomotic Xience DES Stent. Preparation were made to proceed with PTCA on this lesion.  Percutaneous Coronary Intervention: Sheath exchanged for 6 Fr  Guide: 6 Fr AL1 Guidewire: BMW3  Predilation Balloon: Mini Trek 2.0 mm x 12 mm; (Unable to cross pre & post dilation with Flextome 3.0 mm x 10 mm balloon)  14 Atm x 20 Sec Post-dilation Balloon: [employer] Angiosculpt Balloon 3.0 mm x 10 mm;  16 Atm x 60 Sec  Final Diameter: 3.25 -- residual stenosis < 20 % Post deployment angiography in multiple views, with and without guidewire in place revealed excellent expansion of the in-stent stenosis. There was no evidence of dissection or perforation.  MEDICATIONS:  Anesthesia: Local Lidocaine 16 ml Sedation: 6 mg IV Versed, 125 mcg IV fentanyl ;  Omnipaque Contrast: 150 ml  Anticoagulation: IV Heparin 8000 Units  Anti-Platelet Agent: 3000 PATIENT DISPOSITION:  The patient was transferred to the PACU holding area in a hemodynamicaly stable, chest pain free condition.  The patient  tolerated the procedure well, and there were no complications. EBL: < 20 ml  The patient was stable before, during, and after the procedure. POST-OPERATIVE DIAGNOSIS:  Severe 3 Vessel CAD with 4/4 grafts patent,but ~90% anastomotic stent ISR in Xience DES 2.5 mm stent to SVG-OM.  Preserved EF by Echo, normal LVEDP with relatively normal Ao Valve gradient  Successful AngioSculpt PTCA of ISR of SVG-OM with 3.0 mm balloon. PLAN OF CARE:  Overnight monitoring in post-procedure unit  Re-initiate DAPT (would consider 1 yr  as presentation was essentially ACS).  Continue Medical management of HTN, HLD & DM  Anticipate d/c in AM if stable.   Assessment/Plan    51 up male hx of CAD with prior CABG in 2009, multiple subsequent stents, chronic LBBB, ICM, CKD, DM, HL, PUD, admitted with chest pain.   1. CAD - chronic LBBB on EKG cannot eval for ischemia, trops neg.  - cath 07/19/14, LM patent, LAD90% LAD, LCX 20% prox, OM1 occluded, OM2 70%, RCA 100%. LIMA-LAD patent, SVG-RPDA patent, SVG-LPL patent, SVG-OM 95% lesion. PTCA of SVG-OM - plan for DAPT x 1 year. Medical therapy with lipitor, coreg, plavix, ASA.    2. CKD - lasix and ACE-I discontinued, has been receiving gentle IVF - Cr stable after cath yesterday day at 1.65, trended down form admit Cr of 2.2.  - will not restarte ACE-I or spironolactone at this time, discharge on lasix $Remove'40mg'GCgCHGu$  once daily prn for now. Will need repeat BMET at follow up, can consider restarting some of these meds at that time.   3. HTN - elevated bp's, he is off ACE due to poor renal function - will increase coreg to 12.$RemoveBef'5mg'GNOdMaIiHt$  daily  4. Hyperlipidemia - prior myalgias on statin, tolerating crestor at home. Will continue.     Carlyle Dolly, M.D.

## 2014-07-22 NOTE — Progress Notes (Signed)
UR completed - Retro   Keeton Kassebaum K. Iness Pangilinan, RN, BSN, [place], CCM  07/22/2014 2:13 PM

## 2014-07-23 ENCOUNTER — Encounter: Payer: Self-pay | Admitting: Family Medicine

## 2014-07-29 ENCOUNTER — Telehealth: Payer: Self-pay | Admitting: Family Medicine

## 2014-07-29 ENCOUNTER — Other Ambulatory Visit: Payer: Self-pay | Admitting: Family Medicine

## 2014-07-29 NOTE — Telephone Encounter (Signed)
Pt informed

## 2014-07-29 NOTE — Telephone Encounter (Signed)
Pt called to ask if he need to come in for cholesterol check

## 2014-07-29 NOTE — Telephone Encounter (Signed)
no

## 2014-07-29 NOTE — Telephone Encounter (Signed)
Pt did have cholesterol checked on 07/17/14 when in the hospital.

## 2014-08-05 ENCOUNTER — Other Ambulatory Visit: Payer: Self-pay | Admitting: *Deleted

## 2014-08-05 MED ORDER — CLOPIDOGREL BISULFATE 75 MG PO TABS: 75.0000 mg | ORAL_TABLET | Freq: Every day | ORAL | Status: DC

## 2014-08-12 ENCOUNTER — Encounter: Payer: Self-pay | Admitting: Cardiology

## 2014-08-12 ENCOUNTER — Ambulatory Visit (INDEPENDENT_AMBULATORY_CARE_PROVIDER_SITE_OTHER): Payer: Medicare PPO | Admitting: Cardiology

## 2014-08-12 VITALS — BP 120/60 | HR 55 | Ht 69.0 in | Wt 194.7 lb

## 2014-08-12 DIAGNOSIS — I25810 Atherosclerosis of coronary artery bypass graft(s) without angina pectoris: Secondary | ICD-10-CM

## 2014-08-12 NOTE — Progress Notes (Signed)
HPI The patient presents for followup of CAD.   Since I last saw him he was hospitalized. He has been complaining of fatigue and chest discomfort.  A new physician referred him back to [place] because of left bundle branch on EKG which actually was old. However, because of symptoms he was admitted to the hospital. I have reviewed these records. He did not have elevated cardiac enzymes. However, he did have a cardiac catheterization and was found to have a 90% stenosis in a previously stented section of a bypass graft to an OM.  He was treated with a PTCA.  He reports that since this time he has felt much better than he did previously. He did have his dose of beta blocker increased. Lotensin was stopped. He is now off of his Imdur. He is on Crestor instead of Lipitor. He does not appear to be on Glucophage any longer. He is no longer on spironolactone. With all of this he says he has more energy. He's not having as much dizziness. He's not getting any chest pressure, neck or arm discomfort. He's had no palpitations, presyncope or syncope. He has had no weight gain or edema.  Allergies  Allergen Reactions  . Codeine   . Morphine     REACTION: does not like    Current Outpatient Prescriptions  Medication Sig Dispense Refill  . ALPRAZolam (XANAX) 1 MG tablet Take 1 tablet (1 mg total) by mouth 3 (three) times daily as needed.  90 tablet  3  . amLODipine (NORVASC) 10 MG tablet TAKE 1 TABLET EVERY DAY  30 tablet  11  . aspirin EC 81 MG EC tablet Take 1 tablet (81 mg total) by mouth daily.      . carvedilol (COREG) 12.5 MG tablet Take 1 tablet (12.5 mg total) by mouth 2 (two) times daily with a meal.  60 tablet  5  . clopidogrel (PLAVIX) 75 MG tablet Take 1 tablet (75 mg total) by mouth daily.  30 tablet  11  . furosemide (LASIX) 40 MG tablet TAKE 1 TABLET (40 MG TOTAL) BY MOUTH DAILY.  30 tablet  5  . glucose blood (FREESTYLE LITE) test strip 1 each by Other route as needed. Use as instructed       .  JANUVIA 50 MG tablet TAKE 1 TABLET BY MOUTH EVERY DAY  30 tablet  5  . Lancets (FREESTYLE) lancets 1 each by Other route as needed. Use as instructed       . NITROSTAT 0.4 MG SL tablet USE AS DIRECTED  25 tablet  0  . pantoprazole (PROTONIX) 40 MG tablet Take 40 mg by mouth daily.      . rosuvastatin (CRESTOR) 20 MG tablet Take 1 tablet (20 mg total) by mouth daily.  30 tablet  6  . traMADol (ULTRAM) 50 MG tablet Take 1 tablet (50 mg total) by mouth every 6 (six) hours as needed. 1-2 every 6 h prn pain  120 tablet  5   No current facility-administered medications for this visit.    Past Medical History  Diagnosis Date  . CHF (congestive heart failure)   . Diabetes mellitus   . Hypertension   . S/P CABG (coronary artery bypass graft)   . S/P laparoscopic cholecystectomy   . Hernia     hernia repair x 3  . Other and unspecified diseases of appendix   . Pancreatitis   . Prostate ca 03/01/04    prostate bx=Adenocarcinoam,gleason 3+4=7,PSA=6.75  .  Incontinence     hx  over 1 year,leaks without awareness  . Arthritis   . GERD (gastroesophageal reflux disease)   . Ulcer     hx gastric  . Hypercholesterolemia   . Heart murmur   . Chronic kidney disease     nephrolithiasis  . Cataract     surgery,B/L  . Allergy     Past Surgical History  Procedure Laterality Date  . Appendectomy    . Hernia repair    . Carpal tunnel release  2004    both hands  . Laparoscopic cholecystectomy  09/2009  . Incision and drainage of right chest abscess    . Coronary artery bypass graft    . Robot assisted laparoscopic radical prostatectomy  2005  . Back surgery    . Rectal surgery    . Cystoscopy  08/23/12    incomplete emoptying bladder    ROS:  Positive for cold intolerance. Otherwise as stated in the HPI and negative for all other systems.  PHYSICAL EXAM BP 120/60  Pulse 55  Ht 5\' 9"  (1.753 m)  Wt 194 lb 11.2 oz (88.315 kg)  BMI 28.74 kg/m2 GENERAL:  Well appearing HEENT:  Pupils  equal round and reactive, fundi not visualized, oral mucosa unremarkable NECK:  No jugular venous distention, waveform within normal limits, carotid upstroke brisk and symmetric, no bruits, no thyromegaly LUNGS:  Clear to auscultation bilaterally BACK:  No CVA tenderness CHEST:  Well healed sternotomy scar. HEART:  PMI not displaced or sustained,S1 and S2 within normal limits, no S3, no S4, no clicks, no rubs, apical systolic early peaking radiating slightly to the axilla murmur ABD:  Flat, positive bowel sounds normal in frequency in pitch, no bruits, no rebound, no guarding, no midline pulsatile mass, no hepatomegaly, no splenomegaly EXT:  2 plus pulses throughout, no edema, no cyanosis no clubbing  EKG:   Sinus bradycardia, rate 55, right bundle branch block, left anterior fascicular block 08/12/2014  ASSESSMENT AND PLAN   CORONARY ARTERY DISEASE  I have extensively reviewed his hospital records and his recent angioplasty. Given the extent of his disease I will continue him on dual antiplatelet therapy.  DYSPNEA ON EXERTION  This is improved.  No change in therapy is indicated.   FATIGUE  He seems to be improved. No change in therapy is indicated.   Hypertension  The blood pressure is at target. No change in medications is indicated. We will continue with therapeutic lifestyle changes (TLC).  Carotid stenosis-Bilateral  The patient now has bilateral 60-79% stenosis. He is having this repeated again in June.   Bradycardia This has been evaluated in the past and there were no sustained or symptomatic bradycardia arrhythmias.  CKD: His creat is elevated but was improved at discharge compared with previous.  No change in therapy is indicated.     (Greater than 25 minutes reviewing all data with greater than 50% face to face with the patient).

## 2014-08-12 NOTE — Patient Instructions (Signed)
Your physician recommends that you schedule a follow-up appointment in: one year with Dr. Hochrein  

## 2014-08-13 ENCOUNTER — Ambulatory Visit (INDEPENDENT_AMBULATORY_CARE_PROVIDER_SITE_OTHER): Payer: Medicare PPO

## 2014-08-13 DIAGNOSIS — Z23 Encounter for immunization: Secondary | ICD-10-CM

## 2014-08-13 NOTE — Addendum Note (Signed)
Addended by: Dolan Amen on: 08/13/2014 08:56 AM   Modules accepted: Orders

## 2014-08-22 ENCOUNTER — Other Ambulatory Visit: Payer: Self-pay | Admitting: Family Medicine

## 2014-09-26 ENCOUNTER — Encounter (HOSPITAL_COMMUNITY): Payer: Self-pay | Admitting: Cardiology

## 2014-09-30 ENCOUNTER — Other Ambulatory Visit: Payer: Self-pay | Admitting: Cardiology

## 2014-10-07 ENCOUNTER — Ambulatory Visit: Payer: Medicare PPO | Admitting: Cardiology

## 2014-11-19 ENCOUNTER — Other Ambulatory Visit: Payer: Self-pay

## 2014-11-19 NOTE — Telephone Encounter (Signed)
Rx request for amlodipine besylate 10 mg tab-Take 1 tablet every day #30  Pharm:  [facility]  Pls advise.

## 2014-11-20 MED ORDER — AMLODIPINE BESYLATE 10 MG PO TABS: ORAL_TABLET | ORAL | Status: DC

## 2014-12-04 ENCOUNTER — Encounter: Payer: Self-pay | Admitting: Family Medicine

## 2014-12-04 ENCOUNTER — Ambulatory Visit (INDEPENDENT_AMBULATORY_CARE_PROVIDER_SITE_OTHER): Payer: Medicare PPO | Admitting: Family Medicine

## 2014-12-04 VITALS — BP 130/70 | HR 56 | Temp 98.0°F | Wt 196.0 lb

## 2014-12-04 DIAGNOSIS — I10 Essential (primary) hypertension: Secondary | ICD-10-CM

## 2014-12-04 DIAGNOSIS — C61 Malignant neoplasm of prostate: Secondary | ICD-10-CM

## 2014-12-04 DIAGNOSIS — R0600 Dyspnea, unspecified: Secondary | ICD-10-CM

## 2014-12-04 DIAGNOSIS — E1121 Type 2 diabetes mellitus with diabetic nephropathy: Secondary | ICD-10-CM

## 2014-12-04 DIAGNOSIS — N184 Chronic kidney disease, stage 4 (severe): Secondary | ICD-10-CM

## 2014-12-04 LAB — BRAIN NATRIURETIC PEPTIDE: Pro B Natriuretic peptide (BNP): 230 pg/mL — ABNORMAL HIGH (ref 0.0–100.0)

## 2014-12-04 LAB — BASIC METABOLIC PANEL
BUN: 23 mg/dL (ref 6–23)
CO2: 26 mEq/L (ref 19–32)
Calcium: 9.7 mg/dL (ref 8.4–10.5)
Chloride: 104 mEq/L (ref 96–112)
Creatinine, Ser: 1.75 mg/dL — ABNORMAL HIGH (ref 0.40–1.50)
GFR: 40.09 mL/min — AB (ref >60.00)
Glucose, Bld: 157 mg/dL — ABNORMAL HIGH (ref 70–99)
POTASSIUM: 4 meq/L (ref 3.5–5.1)
SODIUM: 138 meq/L (ref 135–145)

## 2014-12-04 LAB — HM DIABETES EYE EXAM

## 2014-12-04 LAB — HM DIABETES FOOT EXAM: HM Diabetic Foot Exam: NORMAL

## 2014-12-04 LAB — PSA: PSA: 3.48 ng/mL (ref 0.10–4.00)

## 2014-12-04 LAB — HEMOGLOBIN A1C: Hgb A1c MFr Bld: 6.4 % (ref 4.6–6.5)

## 2014-12-04 NOTE — Progress Notes (Signed)
Subjective:    Patient ID: [name], male    DOB: [birth year], 81 y.o.   MRN: [id number]  HPI Patient here for routine medical follow-up. He has multiple chronic problems including type 2 diabetes, history of rosacea, CAD, chronic kidney disease, history of ischemic cardiomyopathy, history of prostate cancer followed by urology, hypertension, dyslipidemia. Overall feels well. He has had some recent dyspnea with exertion but denies any chest pain. No orthopnea.  Type 2 diabetes which is been well controlled. Last A1c 5.5%. He does state over the past couple weeks his blood sugars have climbed occasionally over 150 fasting. He takes Januvia 50 mg once daily. We had to discontinue metformin because of his chronic kidney disease. No symptoms of hyperglycemia. Blood pressure stable. No dizziness.  Past Medical History  Diagnosis Date  . CHF (congestive heart failure)   . Diabetes mellitus   . Hypertension   . Hernia   . Other and unspecified diseases of appendix   . Pancreatitis   . Prostate ca 03/01/04    prostate bx=Adenocarcinoam,gleason 3+4=7,PSA=6.75  . Incontinence     hx  over 1 year,leaks without awareness  . Arthritis   . GERD (gastroesophageal reflux disease)   . Ulcer     hx gastric  . Hypercholesterolemia   . Heart murmur   . Chronic kidney disease     nephrolithiasis  . Cataract     surgery,B/L  . CAD (coronary artery disease)     Cath September 2015 LIMA to the LAD patent, SVG to PDA patent, SVG to posterior lateral patent, SVG to OM with a 90% in-stent restenosis at an anastomotic lesion. This was treated with angioplasty.   Past Surgical History  Procedure Laterality Date  . Appendectomy    . Hernia repair    . Carpal tunnel release  2004    both hands  . Laparoscopic cholecystectomy  09/2009  . Incision and drainage of right chest abscess    . Coronary artery bypass graft    . Robot assisted laparoscopic radical prostatectomy  2005  . Back surgery    .  Rectal surgery    . Cystoscopy  08/23/12    incomplete emoptying bladder  . Left heart catheterization with coronary angiogram N/A 07/19/2014    Procedure: LEFT HEART CATHETERIZATION WITH CORONARY ANGIOGRAM;  Surgeon: Leonie Man, MD;  Location: [facility] CATH LAB;  Service: Cardiovascular;  Laterality: N/A;    reports that he quit smoking about 42 years ago. His smoking use included Cigarettes. He has a 2.5 pack-year smoking history. He has never used smokeless tobacco. He reports that he does not drink alcohol or use illicit drugs. family history includes Cancer in his mother. Allergies  Allergen Reactions  . Codeine   . Morphine     REACTION: does not like      Review of Systems  Constitutional: Negative for fatigue and unexpected weight change.  Eyes: Negative for visual disturbance.  Respiratory: Positive for shortness of breath. Negative for cough and chest tightness.   Cardiovascular: Negative for chest pain, palpitations and leg swelling.  Endocrine: Negative for polydipsia and polyuria.  Genitourinary: Negative for dysuria.  Neurological: Negative for dizziness, syncope, weakness, light-headedness and headaches.       Objective:   Physical Exam  Constitutional: He is oriented to person, place, and time. He appears well-developed and well-nourished.  HENT:  Right Ear: External ear normal.  Left Ear: External ear normal.  Mouth/Throat: Oropharynx is clear and moist.  Eyes: Pupils are equal, round, and reactive to light.  Neck: Neck supple. No JVD present. No thyromegaly present.  Cardiovascular: Normal rate and regular rhythm.   Pulmonary/Chest: Effort normal and breath sounds normal. No respiratory distress. He has no wheezes.  He has a few faint crackles in both lung bases  Musculoskeletal: He exhibits no edema.  Neurological: He is alert and oriented to person, place, and time.          Assessment & Plan:  #1 type 2 diabetes. History of excellent control. Recheck  A1c. Not a candidate for metformin secondary to chronic kidney disease. #2 hypertension stable and well controlled #3 dyspnea. He does have some faint crackles in both bases. Check BNP.echocardiogram September 2015 ejection fraction 50-55% #4 chronic kidney disease. Recheck basic metabolic panel

## 2014-12-04 NOTE — Progress Notes (Signed)
Pre visit review using our clinic review tool, if applicable. No additional management support is needed unless otherwise documented below in the visit note. 

## 2014-12-04 NOTE — Addendum Note (Signed)
Addended by: Townsend Roger D on: 12/04/2014 09:20 AM   Modules accepted: Orders

## 2014-12-05 ENCOUNTER — Other Ambulatory Visit: Payer: Self-pay | Admitting: Family Medicine

## 2014-12-05 DIAGNOSIS — E1121 Type 2 diabetes mellitus with diabetic nephropathy: Secondary | ICD-10-CM

## 2014-12-12 ENCOUNTER — Other Ambulatory Visit: Payer: Self-pay | Admitting: Family Medicine

## 2014-12-25 ENCOUNTER — Encounter: Payer: Self-pay | Admitting: Gastroenterology

## 2015-01-10 ENCOUNTER — Other Ambulatory Visit: Payer: Self-pay | Admitting: Family Medicine

## 2015-01-13 NOTE — Telephone Encounter (Signed)
Refill for 6 months. 

## 2015-01-13 NOTE — Telephone Encounter (Signed)
Last OV was 12/04/14

## 2015-01-16 ENCOUNTER — Other Ambulatory Visit: Payer: Self-pay | Admitting: *Deleted

## 2015-01-16 MED ORDER — CARVEDILOL 12.5 MG PO TABS: 12.5000 mg | ORAL_TABLET | Freq: Two times a day (BID) | ORAL | Status: DC

## 2015-01-16 NOTE — Telephone Encounter (Signed)
Rx refill sent to patient pharmacy   

## 2015-03-26 ENCOUNTER — Other Ambulatory Visit: Payer: Self-pay | Admitting: Cardiology

## 2015-03-26 DIAGNOSIS — I6523 Occlusion and stenosis of bilateral carotid arteries: Secondary | ICD-10-CM

## 2015-03-27 ENCOUNTER — Ambulatory Visit (HOSPITAL_COMMUNITY): Payer: Medicare PPO | Attending: Internal Medicine

## 2015-03-27 DIAGNOSIS — I6523 Occlusion and stenosis of bilateral carotid arteries: Secondary | ICD-10-CM

## 2015-03-31 ENCOUNTER — Inpatient Hospital Stay (HOSPITAL_COMMUNITY)
Admission: EM | Admit: 2015-03-31 | Discharge: 2015-04-02 | DRG: 247 | Disposition: A | Discharge status: Home / Self Care | Payer: Medicare PPO | Attending: Cardiology | Admitting: Cardiology

## 2015-03-31 ENCOUNTER — Other Ambulatory Visit (HOSPITAL_COMMUNITY): Payer: Self-pay

## 2015-03-31 ENCOUNTER — Encounter (HOSPITAL_COMMUNITY): Payer: Self-pay | Admitting: Emergency Medicine

## 2015-03-31 ENCOUNTER — Emergency Department (HOSPITAL_COMMUNITY): Payer: Medicare PPO

## 2015-03-31 DIAGNOSIS — I6523 Occlusion and stenosis of bilateral carotid arteries: Secondary | ICD-10-CM | POA: Diagnosis present

## 2015-03-31 DIAGNOSIS — E785 Hyperlipidemia, unspecified: Secondary | ICD-10-CM | POA: Diagnosis present

## 2015-03-31 DIAGNOSIS — I350 Nonrheumatic aortic (valve) stenosis: Secondary | ICD-10-CM | POA: Diagnosis present

## 2015-03-31 DIAGNOSIS — K219 Gastro-esophageal reflux disease without esophagitis: Secondary | ICD-10-CM | POA: Diagnosis present

## 2015-03-31 DIAGNOSIS — R0789 Other chest pain: Secondary | ICD-10-CM | POA: Diagnosis not present

## 2015-03-31 DIAGNOSIS — Z7982 Long term (current) use of aspirin: Secondary | ICD-10-CM | POA: Diagnosis not present

## 2015-03-31 DIAGNOSIS — R079 Chest pain, unspecified: Secondary | ICD-10-CM | POA: Diagnosis present

## 2015-03-31 DIAGNOSIS — E780 Pure hypercholesterolemia: Secondary | ICD-10-CM | POA: Diagnosis present

## 2015-03-31 DIAGNOSIS — T82857A Stenosis of cardiac prosthetic devices, implants and grafts, initial encounter: Secondary | ICD-10-CM | POA: Diagnosis present

## 2015-03-31 DIAGNOSIS — I200 Unstable angina: Secondary | ICD-10-CM

## 2015-03-31 DIAGNOSIS — Z9842 Cataract extraction status, left eye: Secondary | ICD-10-CM

## 2015-03-31 DIAGNOSIS — R32 Unspecified urinary incontinence: Secondary | ICD-10-CM | POA: Diagnosis present

## 2015-03-31 DIAGNOSIS — I25810 Atherosclerosis of coronary artery bypass graft(s) without angina pectoris: Secondary | ICD-10-CM | POA: Diagnosis present

## 2015-03-31 DIAGNOSIS — Z7901 Long term (current) use of anticoagulants: Secondary | ICD-10-CM

## 2015-03-31 DIAGNOSIS — I10 Essential (primary) hypertension: Secondary | ICD-10-CM | POA: Diagnosis present

## 2015-03-31 DIAGNOSIS — Z885 Allergy status to narcotic agent status: Secondary | ICD-10-CM

## 2015-03-31 DIAGNOSIS — I4891 Unspecified atrial fibrillation: Secondary | ICD-10-CM | POA: Diagnosis present

## 2015-03-31 DIAGNOSIS — I2510 Atherosclerotic heart disease of native coronary artery without angina pectoris: Secondary | ICD-10-CM | POA: Diagnosis present

## 2015-03-31 DIAGNOSIS — E1121 Type 2 diabetes mellitus with diabetic nephropathy: Secondary | ICD-10-CM | POA: Diagnosis present

## 2015-03-31 DIAGNOSIS — Z9841 Cataract extraction status, right eye: Secondary | ICD-10-CM

## 2015-03-31 DIAGNOSIS — I129 Hypertensive chronic kidney disease with stage 1 through stage 4 chronic kidney disease, or unspecified chronic kidney disease: Secondary | ICD-10-CM | POA: Diagnosis present

## 2015-03-31 DIAGNOSIS — I429 Cardiomyopathy, unspecified: Secondary | ICD-10-CM | POA: Diagnosis not present

## 2015-03-31 DIAGNOSIS — I071 Rheumatic tricuspid insufficiency: Secondary | ICD-10-CM | POA: Diagnosis present

## 2015-03-31 DIAGNOSIS — I5032 Chronic diastolic (congestive) heart failure: Secondary | ICD-10-CM | POA: Diagnosis present

## 2015-03-31 DIAGNOSIS — Z8546 Personal history of malignant neoplasm of prostate: Secondary | ICD-10-CM

## 2015-03-31 DIAGNOSIS — N184 Chronic kidney disease, stage 4 (severe): Secondary | ICD-10-CM | POA: Diagnosis present

## 2015-03-31 DIAGNOSIS — Z7902 Long term (current) use of antithrombotics/antiplatelets: Secondary | ICD-10-CM

## 2015-03-31 DIAGNOSIS — I25700 Atherosclerosis of coronary artery bypass graft(s), unspecified, with unstable angina pectoris: Secondary | ICD-10-CM

## 2015-03-31 DIAGNOSIS — I447 Left bundle-branch block, unspecified: Secondary | ICD-10-CM | POA: Diagnosis present

## 2015-03-31 DIAGNOSIS — I480 Paroxysmal atrial fibrillation: Secondary | ICD-10-CM | POA: Diagnosis present

## 2015-03-31 DIAGNOSIS — Z951 Presence of aortocoronary bypass graft: Secondary | ICD-10-CM | POA: Diagnosis not present

## 2015-03-31 DIAGNOSIS — Z87891 Personal history of nicotine dependence: Secondary | ICD-10-CM | POA: Diagnosis not present

## 2015-03-31 DIAGNOSIS — I25710 Atherosclerosis of autologous vein coronary artery bypass graft(s) with unstable angina pectoris: Secondary | ICD-10-CM | POA: Diagnosis not present

## 2015-03-31 DIAGNOSIS — I25110 Atherosclerotic heart disease of native coronary artery with unstable angina pectoris: Principal | ICD-10-CM | POA: Diagnosis present

## 2015-03-31 DIAGNOSIS — Z79899 Other long term (current) drug therapy: Secondary | ICD-10-CM

## 2015-03-31 HISTORY — DX: Paroxysmal atrial fibrillation: I48.0

## 2015-03-31 HISTORY — DX: Reserved for inherently not codable concepts without codable children: IMO0001

## 2015-03-31 LAB — PROTIME-INR
INR: 1.11 (ref 0.00–1.49)
PROTHROMBIN TIME: 14.5 s (ref 11.6–15.2)

## 2015-03-31 LAB — CBC
HCT: 40.6 % (ref 39.0–52.0)
Hemoglobin: 13.9 g/dL (ref 13.0–17.0)
MCH: 28.9 pg (ref 26.0–34.0)
MCHC: 34.2 g/dL (ref 30.0–36.0)
MCV: 84.4 fL (ref 78.0–100.0)
PLATELETS: 108 10*3/uL — AB (ref 150–400)
RBC: 4.81 MIL/uL (ref 4.22–5.81)
RDW: 14.6 % (ref 11.5–15.5)
WBC: 6.9 10*3/uL (ref 4.0–10.5)

## 2015-03-31 LAB — BASIC METABOLIC PANEL
Anion gap: 12 (ref 5–15)
BUN: 24 mg/dL — ABNORMAL HIGH (ref 6–20)
CHLORIDE: 102 mmol/L (ref 101–111)
CO2: 21 mmol/L — ABNORMAL LOW (ref 22–32)
CREATININE: 2.16 mg/dL — AB (ref 0.61–1.24)
Calcium: 9 mg/dL (ref 8.9–10.3)
GFR calc Af Amer: 31 mL/min — ABNORMAL LOW (ref >60)
GFR calc non Af Amer: 27 mL/min — ABNORMAL LOW (ref >60)
Glucose, Bld: 226 mg/dL — ABNORMAL HIGH (ref 65–99)
Potassium: 3.9 mmol/L (ref 3.5–5.1)
SODIUM: 135 mmol/L (ref 135–145)

## 2015-03-31 LAB — GLUCOSE, CAPILLARY
Glucose-Capillary: 123 mg/dL — ABNORMAL HIGH (ref 65–99)
Glucose-Capillary: 161 mg/dL — ABNORMAL HIGH (ref 65–99)

## 2015-03-31 LAB — I-STAT TROPONIN, ED: Troponin i, poc: 0.01 ng/mL (ref 0.00–0.08)

## 2015-03-31 LAB — MRSA PCR SCREENING: MRSA by PCR: NEGATIVE

## 2015-03-31 LAB — BRAIN NATRIURETIC PEPTIDE: B Natriuretic Peptide: 195.1 pg/mL — ABNORMAL HIGH (ref 0.0–100.0)

## 2015-03-31 LAB — HEPARIN LEVEL (UNFRACTIONATED): Heparin Unfractionated: 0.25 IU/mL — ABNORMAL LOW (ref 0.30–0.70)

## 2015-03-31 MED ORDER — MORPHINE SULFATE 2 MG/ML IJ SOLN
2.0000 mg | INTRAMUSCULAR | Status: DC | PRN
  Administered 2015-03-31: 2 mg via INTRAVENOUS
  Filled 2015-03-31: qty 1

## 2015-03-31 MED ORDER — ASPIRIN EC 81 MG PO TBEC
81.0000 mg | DELAYED_RELEASE_TABLET | Freq: Every day | ORAL | Status: DC
  Administered 2015-04-01: 81 mg via ORAL
  Filled 2015-03-31: qty 1

## 2015-03-31 MED ORDER — HEPARIN BOLUS VIA INFUSION
4000.0000 [IU] | Freq: Once | INTRAVENOUS | Status: AC
  Administered 2015-03-31: 4000 [IU] via INTRAVENOUS
  Filled 2015-03-31: qty 4000

## 2015-03-31 MED ORDER — SODIUM CHLORIDE 0.9 % IV SOLN
INTRAVENOUS | Status: DC
  Administered 2015-03-31: 15:00:00 via INTRAVENOUS

## 2015-03-31 MED ORDER — AMLODIPINE BESYLATE 10 MG PO TABS
10.0000 mg | ORAL_TABLET | Freq: Every day | ORAL | Status: DC
  Administered 2015-04-01: 10 mg via ORAL
  Filled 2015-03-31: qty 1

## 2015-03-31 MED ORDER — CLOPIDOGREL BISULFATE 75 MG PO TABS
75.0000 mg | ORAL_TABLET | Freq: Every day | ORAL | Status: DC
  Administered 2015-04-01: 75 mg via ORAL
  Filled 2015-03-31: qty 1

## 2015-03-31 MED ORDER — ALPRAZOLAM 0.25 MG PO TABS: 0.2500 mg | ORAL_TABLET | Freq: Every evening | ORAL | Status: DC | PRN

## 2015-03-31 MED ORDER — SODIUM CHLORIDE 0.9 % IV SOLN: 250.0000 mL | INTRAVENOUS | Status: DC | PRN

## 2015-03-31 MED ORDER — ROSUVASTATIN CALCIUM 10 MG PO TABS
20.0000 mg | ORAL_TABLET | Freq: Every day | ORAL | Status: DC
  Administered 2015-03-31 – 2015-04-01 (×2): 20 mg via ORAL
  Filled 2015-03-31 (×2): qty 2

## 2015-03-31 MED ORDER — SPIRONOLACTONE 25 MG PO TABS: 25.0000 mg | ORAL_TABLET | Freq: Every day | ORAL | Status: DC

## 2015-03-31 MED ORDER — PANTOPRAZOLE SODIUM 40 MG PO TBEC
40.0000 mg | DELAYED_RELEASE_TABLET | Freq: Every day | ORAL | Status: DC
  Administered 2015-03-31 – 2015-04-01 (×2): 40 mg via ORAL
  Filled 2015-03-31 (×2): qty 1

## 2015-03-31 MED ORDER — LINAGLIPTIN 5 MG PO TABS
5.0000 mg | ORAL_TABLET | Freq: Every day | ORAL | Status: DC
  Administered 2015-04-01: 5 mg via ORAL
  Filled 2015-03-31: qty 1

## 2015-03-31 MED ORDER — HEPARIN (PORCINE) IN NACL 100-0.45 UNIT/ML-% IJ SOLN
1000.0000 [IU]/h | INTRAMUSCULAR | Status: DC
  Administered 2015-03-31: 900 [IU]/h via INTRAVENOUS
  Filled 2015-03-31: qty 250

## 2015-03-31 MED ORDER — PANTOPRAZOLE SODIUM 40 MG PO TBEC: 40.0000 mg | DELAYED_RELEASE_TABLET | Freq: Every day | ORAL | Status: DC

## 2015-03-31 MED ORDER — CARVEDILOL 12.5 MG PO TABS
12.5000 mg | ORAL_TABLET | Freq: Two times a day (BID) | ORAL | Status: DC
  Administered 2015-03-31 – 2015-04-01 (×2): 12.5 mg via ORAL
  Filled 2015-03-31: qty 2
  Filled 2015-03-31: qty 1

## 2015-03-31 MED ORDER — SODIUM CHLORIDE 0.9 % IJ SOLN: 3.0000 mL | INTRAMUSCULAR | Status: DC | PRN

## 2015-03-31 MED ORDER — SODIUM CHLORIDE 0.9 % IJ SOLN
3.0000 mL | Freq: Two times a day (BID) | INTRAMUSCULAR | Status: DC
  Administered 2015-03-31: 3 mL via INTRAVENOUS

## 2015-03-31 MED ORDER — NITROGLYCERIN IN D5W 200-5 MCG/ML-% IV SOLN
0.0000 ug/min | INTRAVENOUS | Status: DC
  Administered 2015-03-31: 5 ug/min via INTRAVENOUS
  Administered 2015-04-01: 15 ug/min via INTRAVENOUS
  Administered 2015-04-02: 10 ug/min via INTRAVENOUS
  Filled 2015-03-31 (×2): qty 250

## 2015-03-31 MED ORDER — ASPIRIN 81 MG PO CHEW: 81.0000 mg | CHEWABLE_TABLET | ORAL | Status: DC

## 2015-03-31 NOTE — ED Notes (Signed)
Attempted report 

## 2015-03-31 NOTE — ED Notes (Signed)
Pt. Stated, I started having a little of chest pain 2 days ago and it got really bad that I took Ntg. 2 @ 0300 and I took 4 Baby ASA at 0400, pain was no better

## 2015-03-31 NOTE — ED Provider Notes (Signed)
CSN: [id number]     Arrival date & time 03/31/15  0719 History   First MD Initiated Contact with Patient 03/31/15 0741     Chief Complaint  Patient presents with  . Chest Pain     (Consider location/radiation/quality/duration/timing/severity/associated sxs/prior Treatment) Patient is a 81 y.o. male presenting with chest pain. The history is provided by the patient.  Chest Pain Associated symptoms: cough   Associated symptoms: no abdominal pain, no back pain, no headache, no nausea, no numbness, no shortness of breath, not vomiting and no weakness    patient resents with chest pain. Has been having for the last few days. Has history of CABG. He states the last few days he has been having chest pain. He said it may or may not feel like his previous anginal pain. He states he had some dull pain over last few days and has been doing less activity because of it. I did not necessarily worsen with the activity. States today it was much more constant and severe. He did take some nitroglycerin   and aspirin and is now feeling much better but still having some pain. The pain is on the left side of the chest and states it sort of goes through to the side also. He's had a little bit of a cough with occasional sputum. No fevers. No trauma. The pain is worse with certain movements also.     Past Medical History  Diagnosis Date  . CHF (congestive heart failure)   . Diabetes mellitus   . Hypertension   . Hernia   . Other and unspecified diseases of appendix   . Pancreatitis   . Prostate ca 03/01/04    prostate bx=Adenocarcinoam,gleason 3+4=7,PSA=6.75  . Incontinence     hx  over 1 year,leaks without awareness  . Arthritis   . GERD (gastroesophageal reflux disease)   . Ulcer     hx gastric  . Hypercholesterolemia   . Heart murmur   . Chronic kidney disease     nephrolithiasis  . Cataract     surgery,B/L  . CAD (coronary artery disease)     Cath September 2015 LIMA to the LAD patent, SVG to  PDA patent, SVG to posterior lateral patent, SVG to OM with a 90% in-stent restenosis at an anastomotic lesion. This was treated with angioplasty.   Past Surgical History  Procedure Laterality Date  . Appendectomy    . Hernia repair    . Carpal tunnel release  2004    both hands  . Laparoscopic cholecystectomy  09/2009  . Incision and drainage of right chest abscess    . Coronary artery bypass graft    . Robot assisted laparoscopic radical prostatectomy  2005  . Back surgery    . Rectal surgery    . Cystoscopy  08/23/12    incomplete emoptying bladder  . Left heart catheterization with coronary angiogram N/A 07/19/2014    Procedure: LEFT HEART CATHETERIZATION WITH CORONARY ANGIOGRAM;  Surgeon: Leonie Man, MD;  Location: [facility] CATH LAB;  Service: Cardiovascular;  Laterality: N/A;   Family History  Problem Relation Age of Onset  . Cancer Mother     stomach   History  Substance Use Topics  . Smoking status: Former Smoker -- 0.50 packs/day for 5 years    Types: Cigarettes    Quit date: 07/20/1972  . Smokeless tobacco: Never Used     Comment: quit s  . Alcohol Use: No    Review of Systems  Constitutional: Negative for activity change and appetite change.  Eyes: Negative for pain.  Respiratory: Positive for cough. Negative for chest tightness and shortness of breath.   Cardiovascular: Positive for chest pain. Negative for leg swelling.  Gastrointestinal: Negative for nausea, vomiting, abdominal pain and diarrhea.  Genitourinary: Negative for flank pain.  Musculoskeletal: Negative for back pain and neck stiffness.  Skin: Negative for rash.  Neurological: Negative for weakness, numbness and headaches.  Psychiatric/Behavioral: Negative for behavioral problems.      Allergies  Codeine and Morphine  Home Medications   Prior to Admission medications   Medication Sig Start Date End Date Taking? Authorizing Provider  ALPRAZolam Duanne Moron) 1 MG tablet TAKE 1 TABLET 3 TIMES A DAY  AS NEEDED 01/14/15  Yes [name], MD  amLODipine (NORVASC) 10 MG tablet TAKE 1 TABLET EVERY DAY 11/20/14  Yes [name], MD  aspirin EC 81 MG EC tablet Take 1 tablet (81 mg total) by mouth daily. 07/20/14  Yes Brett Canales, PA-C  carvedilol (COREG) 12.5 MG tablet Take 1 tablet (12.5 mg total) by mouth 2 (two) times daily with a meal. 01/16/15  Yes [name], MD  clopidogrel (PLAVIX) 75 MG tablet Take 1 tablet (75 mg total) by mouth daily. 08/05/14  Yes [name], MD  CRESTOR 20 MG tablet TAKE 1 TABLET (20 MG TOTAL) BY MOUTH DAILY. 01/14/15  Yes [name], MD  furosemide (LASIX) 40 MG tablet TAKE 1 TABLET BY MOUTH EVERY DAY 10/01/14  Yes [name], MD  glucose blood (FREESTYLE LITE) test strip 1 each by Other route as needed. Use as instructed    Yes Historical Provider, MD  JANUVIA 50 MG tablet TAKE 1 TABLET BY MOUTH EVERY DAY 08/22/14  Yes [name], MD  Lancets (FREESTYLE) lancets 1 each by Other route as needed. Use as instructed    Yes Historical Provider, MD  NITROSTAT 0.4 MG SL tablet USE AS DIRECTED 07/22/14  Yes [name], MD  pantoprazole (PROTONIX) 40 MG tablet Take 40 mg by mouth daily.   Yes Historical Provider, MD  pantoprazole (PROTONIX) 40 MG tablet TAKE 1 TABLET BY MOUTH EVERY DAY 12/12/14  Yes [name], MD  spironolactone (ALDACTONE) 25 MG tablet Take 25 mg by mouth daily. 03/07/15  Yes Historical Provider, MD  traMADol (ULTRAM) 50 MG tablet Take 1 tablet (50 mg total) by mouth every 6 (six) hours as needed. 1-2 every 6 h prn pain 11/04/11  Yes Bruce Dan Europe, MD   BP 146/67 mmHg  Pulse 67  Temp(Src) 97.5 F (36.4 C)  Resp 16  Ht 5\' 9"  (1.753 m)  Wt 188 lb (85.276 kg)  BMI 27.75 kg/m2  SpO2 96% Physical Exam  Constitutional: He appears well-developed and well-nourished.  Cardiovascular: Normal rate and regular rhythm.   Pulmonary/Chest: He exhibits tenderness.  Some tenderness to left lateral inferior chest wall. No  crepitance. Upon palpation he states this is a different pain than the pain that he was having in his chest. No rash although there is one red spot on the left chest that appears to be an irritated mole  Abdominal: Soft. There is no tenderness.  Musculoskeletal: Normal range of motion.  Neurological: He is alert.  Skin: Skin is warm.    ED Course  Procedures (including critical care time) Labs Review Labs Reviewed  CBC - Abnormal; Notable for the following:    Platelets 108 (*)    All other components within normal limits  BASIC METABOLIC PANEL - Abnormal; Notable for the following:    CO2 21 (*)    Glucose, Bld 226 (*)    BUN 24 (*)    Creatinine, Ser 2.16 (*)    GFR calc non Af Amer 27 (*)    GFR calc Af Amer 31 (*)    All other components within normal limits  BRAIN NATRIURETIC PEPTIDE - Abnormal; Notable for the following:    B Natriuretic Peptide 195.1 (*)    All other components within normal limits  HEPARIN LEVEL (UNFRACTIONATED)  PROTIME-INR  Randolm Idol, ED    Imaging Review Dg Chest Port 1 View  03/31/2015   CLINICAL DATA:  Chest pain for 2 days.  EXAM: PORTABLE CHEST - 1 VIEW  COMPARISON:  07/16/2014.  FINDINGS: Mediastinum hilar structures are normal. Stable perihilar not densities consistent with vessels on end. No focal infiltrate. Low lung volumes. Mild stable pleural thickening.  IMPRESSION: 1. Prior CABG. Stable cardiomegaly. No evidence of congestive heart failure. 2. Low lung volumes.   Electronically Signed   By: Marcello Moores  Register   On: 03/31/2015 08:15     EKG Interpretation   Date/Time:  Monday March 31 2015 07:26:41 EDT Ventricular Rate:  68 PR Interval:    QRS Duration: 160 QT Interval:  483 QTC Calculation: 514 R Axis:   -26 Text Interpretation:  Atrial fibrillation Left bundle branch block  Confirmed by Jonathin Heinicke  MD, Faheem Ziemann [phone]) on 03/31/2015 7:47:59 AM      MDM   Final diagnoses:  Chest pain, unspecified chest pain type     Patient presents with chest pain. He has a cardiac history. Enzymes negative. May be new afib. Admit to cardiology    Davonna Belling, MD 03/31/15 1409

## 2015-03-31 NOTE — Progress Notes (Signed)
ANTICOAGULATION CONSULT NOTE - Initial Consult  Pharmacy Consult for Heparin  Indication:  Unstable angina/ ACS  Allergies  Allergen Reactions  . Codeine   . Morphine     REACTION: does not like    Patient Measurements: Height: 5\' 9"  (175.3 cm) Weight: 187 lb 8 oz (85.049 kg) IBW/kg (Calculated) : 70.7 Heparin Dosing Weight: 85.27 kg  Vital Signs: Temp: 97.7 F (36.5 C) (06/13 1947) Temp Source: Oral (06/13 1947) BP: 130/66 mmHg (06/13 1947) Pulse Rate: 64 (06/13 1947)  Labs:  Recent Labs  03/31/15 0745 03/31/15 1611 03/31/15 2100  HGB 13.9  --   --   HCT 40.6  --   --   PLT 108*  --   --   LABPROT  --  14.5  --   INR  --  1.11  --   HEPARINUNFRC  --   --  0.25*  CREATININE 2.16*  --   --     Estimated Creatinine Clearance: 29.5 mL/min (by C-G formula based on Cr of 2.16).   Medical History: Past Medical History  Diagnosis Date  . CHF (congestive heart failure)   . Hypertension   . Hernia   . Other and unspecified diseases of appendix   . Pancreatitis   . Prostate ca 03/01/04    prostate bx=Adenocarcinoam,gleason 3+4=7,PSA=6.75  . Incontinence     hx  over 1 year,leaks without awareness  . Arthritis   . GERD (gastroesophageal reflux disease)   . Ulcer     hx gastric  . Hypercholesterolemia   . Heart murmur   . Chronic kidney disease     nephrolithiasis  . Cataract     surgery,B/L  . CAD (coronary artery disease)     Cath September 2015 LIMA to the LAD patent, SVG to PDA patent, SVG to posterior lateral patent, SVG to OM with a 90% in-stent restenosis at an anastomotic lesion. This was treated with angioplasty.  . Shortness of breath dyspnea   . Diabetes mellitus     TYPE 2    Medications:  See medication reconciliation   Assessment: 81 y.o male known HTN, HLP, DM, CAD, s/p CABG, the last cath in 06/2014, SVG to OM with a 90% in-stent restenosis at an anastomotic lesion. This was treated with angioplasty. Admitted 03/31/15 AM due to recurrent  chest pain for the last 3 days.  Pharmacy consulted to start IV heparin infusion for ACS. Cardiologist notes plan to schedule a diagnostic cath today.  Reported that patient takes plavix 75 mg daily and aspirin daily. Not on anticoagulant prior to admission.  H/H wnl, PLTC low at 108K, No bright red blood per rectum, melena, or hematemesis reported.   Initial HL is slightly subtherapeutic at 0.25 on heparin 900 units/hr. No issues with infusion or bleeding per RN.  Goal of Therapy:  Heparin level 0.3-0.7 units/ml Monitor platelets by anticoagulation protocol: Yes   Plan:  Increase heparin to 1000 units/hr Heparin level 8 hours after bolus Daily heparin level and CBC Follow up after Cath if patient goes to cath lab today  Andrey Cota. Diona Foley, PharmD Clinical Pharmacist Pager [phone]  03/31/2015,9:49 PM

## 2015-03-31 NOTE — H&P (Signed)
CARDIOLOGY CONSULT NOTE   Patient ID: [name] MRN: [id number], DOB/AGE: [birth year]   Admit date: 03/31/2015 Date of Consult: 03/31/2015  Primary Physician: [name], MD Primary Cardiologist: Dr Percival Spanish  Reason for consult:  Chest pain  Problem List  Past Medical History  Diagnosis Date  . CHF (congestive heart failure)   . Diabetes mellitus   . Hypertension   . Hernia   . Other and unspecified diseases of appendix   . Pancreatitis   . Prostate ca 03/01/04    prostate bx=Adenocarcinoam,gleason 3+4=7,PSA=6.75  . Incontinence     hx  over 1 year,leaks without awareness  . Arthritis   . GERD (gastroesophageal reflux disease)   . Ulcer     hx gastric  . Hypercholesterolemia   . Heart murmur   . Chronic kidney disease     nephrolithiasis  . Cataract     surgery,B/L  . CAD (coronary artery disease)     Cath September 2015 LIMA to the LAD patent, SVG to PDA patent, SVG to posterior lateral patent, SVG to OM with a 90% in-stent restenosis at an anastomotic lesion. This was treated with angioplasty.    Past Surgical History  Procedure Laterality Date  . Appendectomy    . Hernia repair    . Carpal tunnel release  2004    both hands  . Laparoscopic cholecystectomy  09/2009  . Incision and drainage of right chest abscess    . Coronary artery bypass graft    . Robot assisted laparoscopic radical prostatectomy  2005  . Back surgery    . Rectal surgery    . Cystoscopy  08/23/12    incomplete emoptying bladder  . Left heart catheterization with coronary angiogram N/A 07/19/2014    Procedure: LEFT HEART CATHETERIZATION WITH CORONARY ANGIOGRAM;  Surgeon: Leonie Man, MD;  Location: [facility] CATH LAB;  Service: Cardiovascular;  Laterality: N/A;    Allergies  Allergies  Allergen Reactions  . Codeine   . Morphine     REACTION: does not like   HPI   81 year old male with known HTN, HLP, DM, CAD, s/p CABG, the last cath in 06/2014 with LIMA to the LAD patent, SVG  to PDA patent, SVG to posterior lateral patent, SVG to OM with a 90% in-stent restenosis at an anastomotic lesion. This was treated with angioplasty. The patient was doing well with occasional chest pain that were not concerning, however developed recurrent chest pain for the last 3 days, they come and go with some relief with NTG but more significant relief with ASA. This has been going on for 3 days and its not worse with exertion. The pain kept him from sleep the last night and he came to the ER, the 1. Troponin is negative. He was complaint to DAPT with ASA and Plavix as well as crestor and carvedilol. He's had no palpitations, presyncope or syncope. He has had no weight gain or edema.  Inpatient Medications No current facility-administered medications for this encounter.  Current outpatient prescriptions:  .  ALPRAZolam (XANAX) 1 MG tablet, TAKE 1 TABLET 3 TIMES A DAY AS NEEDED, Disp: 90 tablet, Rfl: 5 .  amLODipine (NORVASC) 10 MG tablet, TAKE 1 TABLET EVERY DAY, Disp: 30 tablet, Rfl: 5 .  aspirin EC 81 MG EC tablet, Take 1 tablet (81 mg total) by mouth daily., Disp: , Rfl:  .  carvedilol (COREG) 12.5 MG tablet, Take 1 tablet (12.5 mg total) by mouth 2 (two) times daily  with a meal., Disp: 60 tablet, Rfl: 6 .  clopidogrel (PLAVIX) 75 MG tablet, Take 1 tablet (75 mg total) by mouth daily., Disp: 30 tablet, Rfl: 11 .  CRESTOR 20 MG tablet, TAKE 1 TABLET (20 MG TOTAL) BY MOUTH DAILY., Disp: 30 tablet, Rfl: 5 .  furosemide (LASIX) 40 MG tablet, TAKE 1 TABLET BY MOUTH EVERY DAY, Disp: 30 tablet, Rfl: 10 .  glucose blood (FREESTYLE LITE) test strip, 1 each by Other route as needed. Use as instructed , Disp: , Rfl:  .  JANUVIA 50 MG tablet, TAKE 1 TABLET BY MOUTH EVERY DAY, Disp: 30 tablet, Rfl: 5 .  Lancets (FREESTYLE) lancets, 1 each by Other route as needed. Use as instructed , Disp: , Rfl:  .  NITROSTAT 0.4 MG SL tablet, USE AS DIRECTED, Disp: 25 tablet, Rfl: 0 .  pantoprazole (PROTONIX) 40 MG  tablet, Take 40 mg by mouth daily., Disp: , Rfl:  .  spironolactone (ALDACTONE) 25 MG tablet, Take 25 mg by mouth daily., Disp: , Rfl: 6 .  traMADol (ULTRAM) 50 MG tablet, Take 1 tablet (50 mg total) by mouth every 6 (six) hours as needed. 1-2 every 6 h prn pain, Disp: 120 tablet, Rfl: 5 .  pantoprazole (PROTONIX) 40 MG tablet, TAKE 1 TABLET BY MOUTH EVERY DAY (Patient not taking: Reported on 03/31/2015), Disp: 30 tablet, Rfl: 11   Family History Family History  Problem Relation Age of Onset  . Cancer Mother     stomach    Social History History   Social History  . Marital Status: Married    Spouse Name: N/A  . Number of Children: 2  . Years of Education: N/A   Occupational History  .      Retired   Social History Main Topics  . Smoking status: Former Smoker -- 0.50 packs/day for 5 years    Types: Cigarettes    Quit date: 07/20/1972  . Smokeless tobacco: Never Used     Comment: quit s  . Alcohol Use: No  . Drug Use: No     Comment: quit smoking 40 years ago  . Sexual Activity: No   Other Topics Concern  . Not on file   Social History Narrative   Retired   Married          Review of Systems  General:  No chills, fever, night sweats or weight changes.  Cardiovascular:  No chest pain, dyspnea on exertion, edema, orthopnea, palpitations, paroxysmal nocturnal dyspnea. Dermatological: No rash, lesions/masses Respiratory: No cough, dyspnea Urologic: No hematuria, dysuria Abdominal:   No nausea, vomiting, diarrhea, bright red blood per rectum, melena, or hematemesis Neurologic:  No visual changes, wkns, changes in mental status. All other systems reviewed and are otherwise negative except as noted above.  Physical Exam  Blood pressure 146/63, pulse 63, temperature 97.5 F (36.4 C), resp. rate 16, height 5\' 9"  (1.753 m), weight 188 lb (85.276 kg), SpO2 98 %.  General: Pleasant, NAD Psych: Normal affect. Neuro: Alert and oriented X 3. Moves all extremities  spontaneously. HEENT: Normal  Neck: Supple without bruits or JVD. Lungs:  Resp regular and unlabored, CTA. Heart: RRR no s3, s4, or murmurs. Abdomen: Soft, non-tender, non-distended, BS + x 4.  Extremities: No clubbing, cyanosis or edema. DP/PT/Radials 2+ and equal bilaterally.  Labs  No results for input(s): CKTOTAL, CKMB, TROPONINI in the last 72 hours. Lab Results  Component Value Date   WBC 6.9 03/31/2015   HGB 13.9 03/31/2015  HCT 40.6 03/31/2015   MCV 84.4 03/31/2015   PLT 108* 03/31/2015    Recent Labs Lab 03/31/15 0745  NA 135  K 3.9  CL 102  CO2 21*  BUN 24*  CREATININE 2.16*  CALCIUM 9.0  GLUCOSE 226*   Lab Results  Component Value Date   CHOL 97 07/17/2014   HDL 29* 07/17/2014   LDLCALC 32 07/17/2014   TRIG 181* 07/17/2014   Radiology/Studies  Dg Chest Port 1 View  03/31/2015   CLINICAL DATA:  Chest pain for 2 days.  EXAM: PORTABLE CHEST - 1 VIEW  COMPARISON:  07/16/2014.  FINDINGS: Mediastinum hilar structures are normal. Stable perihilar not densities consistent with vessels on end. No focal infiltrate. Low lung volumes. Mild stable pleural thickening.  IMPRESSION: 1. Prior CABG. Stable cardiomegaly. No evidence of congestive heart failure. 2. Low lung volumes.   Electronically Signed   By: Marcello Moores  Register   On: 03/31/2015 08:15    Echocardiogram: 06/2014 Left ventricle: The cavity size was normal. There was moderate concentric hypertrophy. Systolic function was normal. The estimated ejection fraction was in the range of 50% to 55%. Wall motion was normal; there were no regional wall motion abnormalities. Features are consistent with a pseudonormal left ventricular filling pattern, with concomitant abnormal relaxation and increased filling pressure (grade 2 diastolic dysfunction). Doppler parameters are consistent with elevated ventricular end-diastolic filling pressure. - Aortic valve: Trileaflet; moderately thickened,  moderately calcified leaflets. Cusp separation was moderately reduced. Valve area (VTI): 2.82 cm^2. Valve area (Vmax): 3.22 cm^2. Valve area (Vmean): 2.79 cm^2. - Mitral valve: There was no regurgitation. - Left atrium: The atrium was mildly dilated. - Right ventricle: Systolic function was normal. - Tricuspid valve: There was mild regurgitation. - Pulmonary arteries: Systolic pressure was within the normal range.  Impressions: - Aortic valve is moderately calcified and thickened with restricted leaflet opening. However on the current study the transaortic gradients are only minimally elevated and most probably underestimated, only acquired in 1 view. An alternative study such as TEE should be performed if clinically indicated.  ECG: a-fib, LBBB, unchanged from prior  Left cardiac cath: 07/19/2014 POST-OPERATIVE DIAGNOSIS:   Severe 3 Vessel CAD with 4/4 grafts patent,but ~90% anastomotic stent ISR in Xience DES 2.5 mm stent to SVG-OM.  Preserved EF by Echo, normal LVEDP with relatively normal Ao Valve gradient  Successful AngioSculpt PTCA of ISR of SVG-OM with 3.0 mm balloon.     ASSESSMENT AND PLAN   1. UA - CORONARY ARTERY DISEASE  - the images from 06/2014 were reviewed with Dr Ellyn Hack, we will schedule a diagnostic cath today and if intervention needed we will schedule when Crea improves, we will hold his lasix , he appears euvolemic - continue ASA, plavix, start Heparin drip, continue crestor and carvedilol  2. Chronic diastolic CHF - euvolemic - hold lasix as crea elevated today 2.1, baseline 1.8  3. Hypertension - restart home meds  4. Carotid stenosis-Bilateral  The patient now has bilateral 60-79% stenosis. He is having this repeated again in June.   5. Acute on chronic CKD - hold lasix   Signed, Dorothy Spark, MD, [facility] 03/31/2015, 9:58 AM

## 2015-03-31 NOTE — Consult Note (Addendum)
CARDIOLOGY CONSULT NOTE   Patient ID: [name] MRN: [id number], DOB/AGE: Dec 14, 1932   Admit date: 03/31/2015 Date of Consult: 03/31/2015  Primary Physician: [name], MD Primary Cardiologist: Dr Percival Spanish  Reason for consult:  Chest pain  Problem List  Past Medical History  Diagnosis Date  . CHF (congestive heart failure)   . Diabetes mellitus   . Hypertension   . Hernia   . Other and unspecified diseases of appendix   . Pancreatitis   . Prostate ca 03/01/04    prostate bx=Adenocarcinoam,gleason 3+4=7,PSA=6.75  . Incontinence     hx  over 1 year,leaks without awareness  . Arthritis   . GERD (gastroesophageal reflux disease)   . Ulcer     hx gastric  . Hypercholesterolemia   . Heart murmur   . Chronic kidney disease     nephrolithiasis  . Cataract     surgery,B/L  . CAD (coronary artery disease)     Cath September 2015 LIMA to the LAD patent, SVG to PDA patent, SVG to posterior lateral patent, SVG to OM with a 90% in-stent restenosis at an anastomotic lesion. This was treated with angioplasty.    Past Surgical History  Procedure Laterality Date  . Appendectomy    . Hernia repair    . Carpal tunnel release  2004    both hands  . Laparoscopic cholecystectomy  09/2009  . Incision and drainage of right chest abscess    . Coronary artery bypass graft    . Robot assisted laparoscopic radical prostatectomy  2005  . Back surgery    . Rectal surgery    . Cystoscopy  08/23/12    incomplete emoptying bladder  . Left heart catheterization with coronary angiogram N/A 07/19/2014    Procedure: LEFT HEART CATHETERIZATION WITH CORONARY ANGIOGRAM;  Surgeon: Leonie Man, MD;  Location: [facility] CATH LAB;  Service: Cardiovascular;  Laterality: N/A;    Allergies  Allergies  Allergen Reactions  . Codeine   . Morphine     REACTION: does not like   HPI   81 year old male with known HTN, HLP, DM, CAD, s/p CABG, the last cath in 06/2014 with LIMA to the LAD patent, SVG  to PDA patent, SVG to posterior lateral patent, SVG to OM with a 90% in-stent restenosis at an anastomotic lesion. This was treated with angioplasty. The patient was doing well with occasional chest pain that were not concerning, however developed recurrent chest pain for the last 3 days, they come and go with some relief with NTG but more significant relief with ASA. This has been going on for 3 days and its not worse with exertion. The pain kept him from sleep the last night and he came to the ER, the 1. Troponin is negative. He was complaint to DAPT with ASA and Plavix as well as crestor and carvedilol. He's had no palpitations, presyncope or syncope. He has had no weight gain or edema.  Inpatient Medications No current facility-administered medications for this encounter.  Current outpatient prescriptions:  .  ALPRAZolam (XANAX) 1 MG tablet, TAKE 1 TABLET 3 TIMES A DAY AS NEEDED, Disp: 90 tablet, Rfl: 5 .  amLODipine (NORVASC) 10 MG tablet, TAKE 1 TABLET EVERY DAY, Disp: 30 tablet, Rfl: 5 .  aspirin EC 81 MG EC tablet, Take 1 tablet (81 mg total) by mouth daily., Disp: , Rfl:  .  carvedilol (COREG) 12.5 MG tablet, Take 1 tablet (12.5 mg total) by mouth 2 (two) times daily  with a meal., Disp: 60 tablet, Rfl: 6 .  clopidogrel (PLAVIX) 75 MG tablet, Take 1 tablet (75 mg total) by mouth daily., Disp: 30 tablet, Rfl: 11 .  CRESTOR 20 MG tablet, TAKE 1 TABLET (20 MG TOTAL) BY MOUTH DAILY., Disp: 30 tablet, Rfl: 5 .  furosemide (LASIX) 40 MG tablet, TAKE 1 TABLET BY MOUTH EVERY DAY, Disp: 30 tablet, Rfl: 10 .  glucose blood (FREESTYLE LITE) test strip, 1 each by Other route as needed. Use as instructed , Disp: , Rfl:  .  JANUVIA 50 MG tablet, TAKE 1 TABLET BY MOUTH EVERY DAY, Disp: 30 tablet, Rfl: 5 .  Lancets (FREESTYLE) lancets, 1 each by Other route as needed. Use as instructed , Disp: , Rfl:  .  NITROSTAT 0.4 MG SL tablet, USE AS DIRECTED, Disp: 25 tablet, Rfl: 0 .  pantoprazole (PROTONIX) 40 MG  tablet, Take 40 mg by mouth daily., Disp: , Rfl:  .  spironolactone (ALDACTONE) 25 MG tablet, Take 25 mg by mouth daily., Disp: , Rfl: 6 .  traMADol (ULTRAM) 50 MG tablet, Take 1 tablet (50 mg total) by mouth every 6 (six) hours as needed. 1-2 every 6 h prn pain, Disp: 120 tablet, Rfl: 5 .  pantoprazole (PROTONIX) 40 MG tablet, TAKE 1 TABLET BY MOUTH EVERY DAY (Patient not taking: Reported on 03/31/2015), Disp: 30 tablet, Rfl: 11   Family History Family History  Problem Relation Age of Onset  . Cancer Mother     stomach    Social History History   Social History  . Marital Status: Married    Spouse Name: N/A  . Number of Children: 2  . Years of Education: N/A   Occupational History  .      Retired   Social History Main Topics  . Smoking status: Former Smoker -- 0.50 packs/day for 5 years    Types: Cigarettes    Quit date: 07/20/1972  . Smokeless tobacco: Never Used     Comment: quit s  . Alcohol Use: No  . Drug Use: No     Comment: quit smoking 40 years ago  . Sexual Activity: No   Other Topics Concern  . Not on file   Social History Narrative   Retired   Married          Review of Systems  General:  No chills, fever, night sweats or weight changes.  Cardiovascular:  No chest pain, dyspnea on exertion, edema, orthopnea, palpitations, paroxysmal nocturnal dyspnea. Dermatological: No rash, lesions/masses Respiratory: No cough, dyspnea Urologic: No hematuria, dysuria Abdominal:   No nausea, vomiting, diarrhea, bright red blood per rectum, melena, or hematemesis Neurologic:  No visual changes, wkns, changes in mental status. All other systems reviewed and are otherwise negative except as noted above.  Physical Exam  Blood pressure 146/63, pulse 63, temperature 97.5 F (36.4 C), resp. rate 16, height 5\' 9"  (1.753 m), weight 188 lb (85.276 kg), SpO2 98 %.  General: Pleasant, NAD Psych: Normal affect. Neuro: Alert and oriented X 3. Moves all extremities  spontaneously. HEENT: Normal  Neck: Supple without bruits or JVD. Lungs:  Resp regular and unlabored, CTA. Heart: RRR no s3, s4, or murmurs. Abdomen: Soft, non-tender, non-distended, BS + x 4.  Extremities: No clubbing, cyanosis or edema. DP/PT/Radials 2+ and equal bilaterally.  Labs  No results for input(s): CKTOTAL, CKMB, TROPONINI in the last 72 hours. Lab Results  Component Value Date   WBC 6.9 03/31/2015   HGB 13.9 03/31/2015  HCT 40.6 03/31/2015   MCV 84.4 03/31/2015   PLT 108* 03/31/2015    Recent Labs Lab 03/31/15 0745  NA 135  K 3.9  CL 102  CO2 21*  BUN 24*  CREATININE 2.16*  CALCIUM 9.0  GLUCOSE 226*   Lab Results  Component Value Date   CHOL 97 07/17/2014   HDL 29* 07/17/2014   LDLCALC 32 07/17/2014   TRIG 181* 07/17/2014   Radiology/Studies  Dg Chest Port 1 View  03/31/2015   CLINICAL DATA:  Chest pain for 2 days.  EXAM: PORTABLE CHEST - 1 VIEW  COMPARISON:  07/16/2014.  FINDINGS: Mediastinum hilar structures are normal. Stable perihilar not densities consistent with vessels on end. No focal infiltrate. Low lung volumes. Mild stable pleural thickening.  IMPRESSION: 1. Prior CABG. Stable cardiomegaly. No evidence of congestive heart failure. 2. Low lung volumes.   Electronically Signed   By: Marcello Moores  Register   On: 03/31/2015 08:15    Echocardiogram: 06/2014 Left ventricle: The cavity size was normal. There was moderate concentric hypertrophy. Systolic function was normal. The estimated ejection fraction was in the range of 50% to 55%. Wall motion was normal; there were no regional wall motion abnormalities. Features are consistent with a pseudonormal left ventricular filling pattern, with concomitant abnormal relaxation and increased filling pressure (grade 2 diastolic dysfunction). Doppler parameters are consistent with elevated ventricular end-diastolic filling pressure. - Aortic valve: Trileaflet; moderately thickened,  moderately calcified leaflets. Cusp separation was moderately reduced. Valve area (VTI): 2.82 cm^2. Valve area (Vmax): 3.22 cm^2. Valve area (Vmean): 2.79 cm^2. - Mitral valve: There was no regurgitation. - Left atrium: The atrium was mildly dilated. - Right ventricle: Systolic function was normal. - Tricuspid valve: There was mild regurgitation. - Pulmonary arteries: Systolic pressure was within the normal range.  Impressions: - Aortic valve is moderately calcified and thickened with restricted leaflet opening. However on the current study the transaortic gradients are only minimally elevated and most probably underestimated, only acquired in 1 view. An alternative study such as TEE should be performed if clinically indicated.  ECG: a-fib, LBBB, unchanged from prior  Left cardiac cath: 07/19/2014 POST-OPERATIVE DIAGNOSIS:   Severe 3 Vessel CAD with 4/4 grafts patent,but ~90% anastomotic stent ISR in Xience DES 2.5 mm stent to SVG-OM.  Preserved EF by Echo, normal LVEDP with relatively normal Ao Valve gradient  Successful AngioSculpt PTCA of ISR of SVG-OM with 3.0 mm balloon.     ASSESSMENT AND PLAN   1. UA - CORONARY ARTERY DISEASE  - the images from 06/2014 were reviewed with Dr Ellyn Hack, we will schedule a diagnostic cath today and if intervention needed we will schedule when Crea improves, we will hold his lasix , he appears euvolemic - continue ASA, plavix, start Heparin drip, continue crestor and carvedilol  2. Chronic diastolic CHF - euvolemic - hold lasix as crea elevated today 2.1, baseline 1.8  3. Hypertension - restart home meds  4. Carotid stenosis-Bilateral  The patient now has bilateral 60-79% stenosis. He is having this repeated again in June.   5. Acute on chronic CKD - hold lasix   Signed, Dorothy Spark, MD, [facility] 03/31/2015, 9:58 AM

## 2015-03-31 NOTE — Progress Notes (Addendum)
ANTICOAGULATION CONSULT NOTE - Initial Consult  Pharmacy Consult for Heparin  Indication:  Unstable angina/ ACS  Allergies  Allergen Reactions  . Codeine   . Morphine     REACTION: does not like    Patient Measurements: Height: 5\' 9"  (175.3 cm) Weight: 188 lb (85.276 kg) IBW/kg (Calculated) : 70.7 Heparin Dosing Weight: 85.27 kg  Vital Signs: Temp: 97.5 F (36.4 C) (06/13 0726) BP: 120/58 mmHg (06/13 1100) Pulse Rate: 65 (06/13 1100)  Labs:  Recent Labs  03/31/15 0745  HGB 13.9  HCT 40.6  PLT 108*  CREATININE 2.16*    Estimated Creatinine Clearance: 29.5 mL/min (by C-G formula based on Cr of 2.16).   Medical History: Past Medical History  Diagnosis Date  . CHF (congestive heart failure)   . Diabetes mellitus   . Hypertension   . Hernia   . Other and unspecified diseases of appendix   . Pancreatitis   . Prostate ca 03/01/04    prostate bx=Adenocarcinoam,gleason 3+4=7,PSA=6.75  . Incontinence     hx  over 1 year,leaks without awareness  . Arthritis   . GERD (gastroesophageal reflux disease)   . Ulcer     hx gastric  . Hypercholesterolemia   . Heart murmur   . Chronic kidney disease     nephrolithiasis  . Cataract     surgery,B/L  . CAD (coronary artery disease)     Cath September 2015 LIMA to the LAD patent, SVG to PDA patent, SVG to posterior lateral patent, SVG to OM with a 90% in-stent restenosis at an anastomotic lesion. This was treated with angioplasty.    Medications:  See medication reconciliation   Assessment: 81 y.o male known HTN, HLP, DM, CAD, s/p CABG, the last cath in 06/2014, SVG to OM with a 90% in-stent restenosis at an anastomotic lesion. This was treated with angioplasty. Admitted 03/31/15 AM due to recurrent chest pain for the last 3 days.  Pharmacy consulted to start IV heparin infusion for ACS. Cardiologist notes plan to schedule a diagnostic cath today.  Reported that patient takes plavix 75 mg daily and aspirin daily. Not on  anticoagulant prior to admission.  H/H wnl, PLTC low at 108K, No bright red blood per rectum, melena, or hematemesis reported.    Goal of Therapy:  Heparin level 0.3-0.7 units/ml Monitor platelets by anticoagulation protocol: Yes   Plan:  Heparin bolus 4000 units IV x1 (<50 units/kg x1) Start IV heparin drip at 900 units/hr Heparin level 8 hours after bolus Daily heparin level and CBC Follow up after Cath if patient goes to cath lab today.  Thank you for allowing pharmacy to be part of this patients care team. Nicole Cella, RPh Clinical Pharmacist Pager: [phone] 03/31/2015,11:59 AM

## 2015-03-31 NOTE — ED Notes (Signed)
This RN spoke with Hal, PA with cardiology for admission to Stepdown due to active chest pain.

## 2015-03-31 NOTE — ED Notes (Signed)
Cardiology MD at bedside.

## 2015-03-31 NOTE — ED Notes (Signed)
MD at bedside. 

## 2015-04-01 ENCOUNTER — Encounter (HOSPITAL_COMMUNITY): Payer: Self-pay | Admitting: Interventional Cardiology

## 2015-04-01 ENCOUNTER — Encounter (HOSPITAL_COMMUNITY)
Admission: EM | Disposition: A | Discharge status: Home / Self Care | Payer: Medicare PPO | Source: Home / Self Care | Attending: Cardiology

## 2015-04-01 DIAGNOSIS — N184 Chronic kidney disease, stage 4 (severe): Secondary | ICD-10-CM

## 2015-04-01 DIAGNOSIS — E1121 Type 2 diabetes mellitus with diabetic nephropathy: Secondary | ICD-10-CM | POA: Diagnosis present

## 2015-04-01 DIAGNOSIS — Z9842 Cataract extraction status, left eye: Secondary | ICD-10-CM | POA: Diagnosis not present

## 2015-04-01 DIAGNOSIS — R079 Chest pain, unspecified: Secondary | ICD-10-CM | POA: Diagnosis present

## 2015-04-01 DIAGNOSIS — Z885 Allergy status to narcotic agent status: Secondary | ICD-10-CM | POA: Diagnosis not present

## 2015-04-01 DIAGNOSIS — Z951 Presence of aortocoronary bypass graft: Secondary | ICD-10-CM | POA: Diagnosis not present

## 2015-04-01 DIAGNOSIS — I6523 Occlusion and stenosis of bilateral carotid arteries: Secondary | ICD-10-CM | POA: Diagnosis present

## 2015-04-01 DIAGNOSIS — I447 Left bundle-branch block, unspecified: Secondary | ICD-10-CM

## 2015-04-01 DIAGNOSIS — I071 Rheumatic tricuspid insufficiency: Secondary | ICD-10-CM | POA: Diagnosis present

## 2015-04-01 DIAGNOSIS — E780 Pure hypercholesterolemia: Secondary | ICD-10-CM | POA: Diagnosis present

## 2015-04-01 DIAGNOSIS — I480 Paroxysmal atrial fibrillation: Secondary | ICD-10-CM | POA: Diagnosis present

## 2015-04-01 DIAGNOSIS — I25700 Atherosclerosis of coronary artery bypass graft(s), unspecified, with unstable angina pectoris: Secondary | ICD-10-CM | POA: Diagnosis not present

## 2015-04-01 DIAGNOSIS — Z8546 Personal history of malignant neoplasm of prostate: Secondary | ICD-10-CM | POA: Diagnosis not present

## 2015-04-01 DIAGNOSIS — T82857A Stenosis of cardiac prosthetic devices, implants and grafts, initial encounter: Secondary | ICD-10-CM | POA: Diagnosis present

## 2015-04-01 DIAGNOSIS — E785 Hyperlipidemia, unspecified: Secondary | ICD-10-CM | POA: Diagnosis present

## 2015-04-01 DIAGNOSIS — I5032 Chronic diastolic (congestive) heart failure: Secondary | ICD-10-CM | POA: Diagnosis present

## 2015-04-01 DIAGNOSIS — I429 Cardiomyopathy, unspecified: Secondary | ICD-10-CM | POA: Diagnosis present

## 2015-04-01 DIAGNOSIS — Z79899 Other long term (current) drug therapy: Secondary | ICD-10-CM | POA: Diagnosis not present

## 2015-04-01 DIAGNOSIS — I25710 Atherosclerosis of autologous vein coronary artery bypass graft(s) with unstable angina pectoris: Secondary | ICD-10-CM | POA: Diagnosis not present

## 2015-04-01 DIAGNOSIS — Z9841 Cataract extraction status, right eye: Secondary | ICD-10-CM | POA: Diagnosis not present

## 2015-04-01 DIAGNOSIS — Z87891 Personal history of nicotine dependence: Secondary | ICD-10-CM | POA: Diagnosis not present

## 2015-04-01 DIAGNOSIS — I129 Hypertensive chronic kidney disease with stage 1 through stage 4 chronic kidney disease, or unspecified chronic kidney disease: Secondary | ICD-10-CM | POA: Diagnosis present

## 2015-04-01 DIAGNOSIS — Z7982 Long term (current) use of aspirin: Secondary | ICD-10-CM | POA: Diagnosis not present

## 2015-04-01 DIAGNOSIS — K219 Gastro-esophageal reflux disease without esophagitis: Secondary | ICD-10-CM | POA: Diagnosis present

## 2015-04-01 DIAGNOSIS — I200 Unstable angina: Secondary | ICD-10-CM | POA: Diagnosis not present

## 2015-04-01 DIAGNOSIS — I4891 Unspecified atrial fibrillation: Secondary | ICD-10-CM | POA: Diagnosis present

## 2015-04-01 DIAGNOSIS — I10 Essential (primary) hypertension: Secondary | ICD-10-CM | POA: Diagnosis not present

## 2015-04-01 DIAGNOSIS — I25810 Atherosclerosis of coronary artery bypass graft(s) without angina pectoris: Secondary | ICD-10-CM | POA: Diagnosis present

## 2015-04-01 DIAGNOSIS — Z7902 Long term (current) use of antithrombotics/antiplatelets: Secondary | ICD-10-CM | POA: Diagnosis not present

## 2015-04-01 DIAGNOSIS — R32 Unspecified urinary incontinence: Secondary | ICD-10-CM | POA: Diagnosis present

## 2015-04-01 DIAGNOSIS — Z7901 Long term (current) use of anticoagulants: Secondary | ICD-10-CM | POA: Diagnosis not present

## 2015-04-01 DIAGNOSIS — I25110 Atherosclerotic heart disease of native coronary artery with unstable angina pectoris: Secondary | ICD-10-CM | POA: Diagnosis present

## 2015-04-01 DIAGNOSIS — I350 Nonrheumatic aortic (valve) stenosis: Secondary | ICD-10-CM | POA: Diagnosis present

## 2015-04-01 HISTORY — PX: CARDIAC CATHETERIZATION: SHX172

## 2015-04-01 LAB — BASIC METABOLIC PANEL
Anion gap: 9 (ref 5–15)
BUN: 24 mg/dL — AB (ref 6–20)
CHLORIDE: 102 mmol/L (ref 101–111)
CO2: 25 mmol/L (ref 22–32)
Calcium: 9.4 mg/dL (ref 8.9–10.3)
Creatinine, Ser: 2.14 mg/dL — ABNORMAL HIGH (ref 0.61–1.24)
GFR calc Af Amer: 32 mL/min — ABNORMAL LOW (ref >60)
GFR calc non Af Amer: 27 mL/min — ABNORMAL LOW (ref >60)
Glucose, Bld: 196 mg/dL — ABNORMAL HIGH (ref 65–99)
Potassium: 4.3 mmol/L (ref 3.5–5.1)
Sodium: 136 mmol/L (ref 135–145)

## 2015-04-01 LAB — PROTIME-INR
INR: 1.09 (ref 0.00–1.49)
Prothrombin Time: 14.3 seconds (ref 11.6–15.2)

## 2015-04-01 LAB — GLUCOSE, CAPILLARY
Glucose-Capillary: 108 mg/dL — ABNORMAL HIGH (ref 65–99)
Glucose-Capillary: 178 mg/dL — ABNORMAL HIGH (ref 65–99)
Glucose-Capillary: 180 mg/dL — ABNORMAL HIGH (ref 65–99)
Glucose-Capillary: 116 mg/dL — ABNORMAL HIGH (ref 65–99)

## 2015-04-01 LAB — CBC
HCT: 41.4 % (ref 39.0–52.0)
Hemoglobin: 14.5 g/dL (ref 13.0–17.0)
MCH: 29.4 pg (ref 26.0–34.0)
MCHC: 35 g/dL (ref 30.0–36.0)
MCV: 83.8 fL (ref 78.0–100.0)
Platelets: 137 10*3/uL — ABNORMAL LOW (ref 150–400)
RBC: 4.94 MIL/uL (ref 4.22–5.81)
RDW: 14.6 % (ref 11.5–15.5)
WBC: 8 10*3/uL (ref 4.0–10.5)

## 2015-04-01 LAB — POCT ACTIVATED CLOTTING TIME
Activated Clotting Time: 110 seconds
Activated Clotting Time: 399 seconds
Activated Clotting Time: 171 seconds

## 2015-04-01 LAB — HEPARIN LEVEL (UNFRACTIONATED): Heparin Unfractionated: 0.31 IU/mL (ref 0.30–0.70)

## 2015-04-01 LAB — TROPONIN I

## 2015-04-01 SURGERY — LEFT HEART CATH AND CORS/GRAFTS ANGIOGRAPHY

## 2015-04-01 MED ORDER — NITROGLYCERIN 1 MG/10 ML FOR IR/CATH LAB
INTRA_ARTERIAL | Status: AC
  Filled 2015-04-01: qty 10

## 2015-04-01 MED ORDER — SODIUM CHLORIDE 0.9 % IJ SOLN: 3.0000 mL | INTRAMUSCULAR | Status: DC | PRN

## 2015-04-01 MED ORDER — SODIUM CHLORIDE 0.9 % IJ SOLN
3.0000 mL | Freq: Two times a day (BID) | INTRAMUSCULAR | Status: DC
  Administered 2015-04-01 – 2015-04-02 (×3): 3 mL via INTRAVENOUS

## 2015-04-01 MED ORDER — VERAPAMIL HCL 2.5 MG/ML IV SOLN
INTRAVENOUS | Status: AC
  Filled 2015-04-01: qty 2

## 2015-04-01 MED ORDER — LIDOCAINE HCL (PF) 1 % IJ SOLN
INTRAMUSCULAR | Status: DC | PRN
  Administered 2015-04-01: 10 mL via SUBCUTANEOUS

## 2015-04-01 MED ORDER — ACETAMINOPHEN 325 MG PO TABS
650.0000 mg | ORAL_TABLET | ORAL | Status: DC | PRN
  Administered 2015-04-02: 650 mg via ORAL
  Filled 2015-04-01: qty 2

## 2015-04-01 MED ORDER — NITROGLYCERIN 1 MG/10 ML FOR IR/CATH LAB
INTRA_ARTERIAL | Status: DC | PRN
  Administered 2015-04-01: 200 ug via INTRACORONARY

## 2015-04-01 MED ORDER — SODIUM CHLORIDE 0.9 % IJ SOLN: 3.0000 mL | Freq: Two times a day (BID) | INTRAMUSCULAR | Status: DC

## 2015-04-01 MED ORDER — MIDAZOLAM HCL 2 MG/2ML IJ SOLN
INTRAMUSCULAR | Status: AC
  Filled 2015-04-01: qty 2

## 2015-04-01 MED ORDER — CLOPIDOGREL BISULFATE 300 MG PO TABS
ORAL_TABLET | ORAL | Status: DC | PRN
  Administered 2015-04-01: 300 mg via ORAL

## 2015-04-01 MED ORDER — ASPIRIN 81 MG PO CHEW
81.0000 mg | CHEWABLE_TABLET | ORAL | Status: AC
  Administered 2015-04-01: 81 mg via ORAL
  Filled 2015-04-01: qty 1

## 2015-04-01 MED ORDER — HEPARIN (PORCINE) IN NACL 100-0.45 UNIT/ML-% IJ SOLN: 1000.0000 [IU]/h | INTRAMUSCULAR | Status: DC

## 2015-04-01 MED ORDER — FENTANYL CITRATE (PF) 100 MCG/2ML IJ SOLN
INTRAMUSCULAR | Status: DC | PRN
  Administered 2015-04-01 (×3): 25 ug via INTRAVENOUS

## 2015-04-01 MED ORDER — HEPARIN (PORCINE) IN NACL 100-0.45 UNIT/ML-% IJ SOLN
1150.0000 [IU]/h | INTRAMUSCULAR | Status: DC
  Administered 2015-04-01: 1050 [IU]/h via INTRAVENOUS
  Filled 2015-04-01: qty 250

## 2015-04-01 MED ORDER — CLOPIDOGREL BISULFATE 300 MG PO TABS
ORAL_TABLET | ORAL | Status: AC
  Filled 2015-04-01: qty 1

## 2015-04-01 MED ORDER — HEPARIN (PORCINE) IN NACL 2-0.9 UNIT/ML-% IJ SOLN
INTRAMUSCULAR | Status: AC
  Filled 2015-04-01: qty 1000

## 2015-04-01 MED ORDER — SODIUM CHLORIDE 0.9 % IV SOLN
250.0000 mg | INTRAVENOUS | Status: DC | PRN
  Administered 2015-04-01: 1.75 mg/kg/h via INTRAVENOUS

## 2015-04-01 MED ORDER — SODIUM CHLORIDE 0.9 % IV SOLN: 250.0000 mL | INTRAVENOUS | Status: DC | PRN

## 2015-04-01 MED ORDER — SODIUM CHLORIDE 0.9 % IV SOLN: INTRAVENOUS | Status: DC

## 2015-04-01 MED ORDER — FENTANYL CITRATE (PF) 100 MCG/2ML IJ SOLN
INTRAMUSCULAR | Status: AC
  Filled 2015-04-01: qty 2

## 2015-04-01 MED ORDER — ONDANSETRON HCL 4 MG/2ML IJ SOLN: 4.0000 mg | Freq: Four times a day (QID) | INTRAMUSCULAR | Status: DC | PRN

## 2015-04-01 MED ORDER — BIVALIRUDIN 250 MG IV SOLR
INTRAVENOUS | Status: AC
  Filled 2015-04-01: qty 250

## 2015-04-01 MED ORDER — IOHEXOL 350 MG/ML SOLN
INTRAVENOUS | Status: DC | PRN
  Administered 2015-04-01: 60 mL via INTRA_ARTERIAL

## 2015-04-01 MED ORDER — INSULIN ASPART 100 UNIT/ML ~~LOC~~ SOLN
0.0000 [IU] | Freq: Three times a day (TID) | SUBCUTANEOUS | Status: DC
  Administered 2015-04-01: 18:00:00 2 [IU] via SUBCUTANEOUS

## 2015-04-01 MED ORDER — LIDOCAINE HCL (PF) 1 % IJ SOLN
INTRAMUSCULAR | Status: AC
  Filled 2015-04-01: qty 30

## 2015-04-01 MED ORDER — CLOPIDOGREL BISULFATE 75 MG PO TABS
75.0000 mg | ORAL_TABLET | Freq: Every day | ORAL | Status: DC
  Administered 2015-04-02: 75 mg via ORAL
  Filled 2015-04-01: qty 1

## 2015-04-01 MED ORDER — MIDAZOLAM HCL 2 MG/2ML IJ SOLN
INTRAMUSCULAR | Status: DC | PRN
  Administered 2015-04-01 (×3): 1 mg via INTRAVENOUS
  Administered 2015-04-01: 2 mg via INTRAVENOUS

## 2015-04-01 MED ORDER — SODIUM CHLORIDE 0.9 % WEIGHT BASED INFUSION: 3.0000 mL/kg/h | INTRAVENOUS | Status: AC

## 2015-04-01 MED ORDER — ASPIRIN 81 MG PO CHEW
81.0000 mg | CHEWABLE_TABLET | Freq: Every day | ORAL | Status: DC
  Administered 2015-04-02: 10:00:00 81 mg via ORAL
  Filled 2015-04-01 (×2): qty 1

## 2015-04-01 MED ORDER — BIVALIRUDIN BOLUS VIA INFUSION - CUPID
INTRAVENOUS | Status: DC | PRN
  Administered 2015-04-01: 63.45 mg via INTRAVENOUS

## 2015-04-01 SURGICAL SUPPLY — 28 items
BALLN TREK RX 2.5X15 (BALLOONS) ×3
BALLN ~~LOC~~ EUPHORA RX 3.25X12 (BALLOONS) ×3
BALLOON TREK RX 2.5X15 (BALLOONS) IMPLANT
BALLOON ~~LOC~~ EUPHORA RX 3.25X12 (BALLOONS) IMPLANT
CATH INFINITI 5 FR JL3.5 (CATHETERS) ×1 IMPLANT
CATH INFINITI 5 FR RCB (CATHETERS) ×2 IMPLANT
CATH INFINITI 5FR AL1 (CATHETERS) ×2 IMPLANT
CATH INFINITI 5FR ANG PIGTAIL (CATHETERS) ×1 IMPLANT
CATH INFINITI 5FR JL5 (CATHETERS) ×2 IMPLANT
CATH INFINITI 5FR MULTPACK ANG (CATHETERS) ×2 IMPLANT
CATH INFINITI JR4 5F (CATHETERS) ×1 IMPLANT
DEVICE RAD COMP TR BAND LRG (VASCULAR PRODUCTS) ×3 IMPLANT
GLIDESHEATH INTRODUCER 6F 10CM (SHEATH) ×2 IMPLANT
GLIDESHEATH SLEND SS 6F .021 (SHEATH) ×1 IMPLANT
GUIDE CATH RUNWAY 6FR AL 1 (CATHETERS) ×2 IMPLANT
KIT ENCORE 26 ADVANTAGE (KITS) ×2 IMPLANT
KIT HEART LEFT (KITS) ×3 IMPLANT
PACK CARDIAC CATHETERIZATION (CUSTOM PROCEDURE TRAY) ×3 IMPLANT
SHEATH PINNACLE 5F 10CM (SHEATH) ×2 IMPLANT
STENT SYNERGY DES 2.75X24 (Permanent Stent) ×2 IMPLANT
SYR MEDRAD MARK V 150ML (SYRINGE) ×1 IMPLANT
TRANSDUCER W/STOPCOCK (MISCELLANEOUS) ×3 IMPLANT
TUBING CIL FLEX 10 FLL-RA (TUBING) ×3 IMPLANT
VALVE GUARDIAN II ~~LOC~~ HEMO (MISCELLANEOUS) ×2 IMPLANT
WIRE ASAHI FIELDER XT 190CM (WIRE) ×2 IMPLANT
WIRE ASAHI PROWATER 180CM (WIRE) ×2 IMPLANT
WIRE EMERALD 3MM-J .035X150CM (WIRE) ×2 IMPLANT
WIRE SAFE-T 1.5MM-J .035X260CM (WIRE) ×1 IMPLANT

## 2015-04-01 NOTE — Interval H&P Note (Signed)
Cath Lab Visit (complete for each Cath Lab visit)  Clinical Evaluation Leading to the Procedure:   ACS: Yes.    Non-ACS:    Anginal Classification: CCS IV  Anti-ischemic medical therapy: Maximal Therapy (2 or more classes of medications)  Non-Invasive Test Results: No non-invasive testing performed  Prior CABG: Previous CABG   TIMI SCORE  Patient Information:  TIMI Score is 4  UA/NSTEMI and intermediate-risk features (e.g., TIMI score 3?4) for short-term risk of death or nonfatal MI  Revascularization of the presumed culprit artery   A (8)  Indication: 10; Score: 8    History and Physical Interval Note:  04/01/2015 8:41 AM  [name]  has presented today for surgery, with the diagnosis of cp  The various methods of treatment have been discussed with the patient and family. After consideration of risks, benefits and other options for treatment, the patient has consented to  Procedure(s): Left Heart Cath and Cors/Grafts Angiography (N/A) as a surgical intervention .  The patient's history has been reviewed, patient examined, no change in status, stable for surgery.  I have reviewed the patient's chart and labs.  Questions were answered to the patient's satisfaction.     Issacc Merlo S.

## 2015-04-01 NOTE — Progress Notes (Signed)
Pt converted to Sinus Loletha Grayer after 1.71 second pause. Will continue to monitor.

## 2015-04-01 NOTE — Research (Signed)
Aviator Informed Consent   Subject Name: [name]  Subject met inclusion and exclusion criteria.  The informed consent form, study requirements and expectations were reviewed with the subject and questions and concerns were addressed prior to the signing of the consent form.  The subject verbalized understanding of the trail requirements.  The subject agreed to participate in the Aviator trial and signed the informed consent.  The informed consent was obtained prior to performance of any protocol-specific procedures for the subject.  A copy of the signed informed consent was given to the subject and a copy was placed in the subject's medical record.  Sandie Ano 04/01/2015,14:10

## 2015-04-01 NOTE — Progress Notes (Signed)
Paged On call Cards Fellow at [phone] for further Cath orders for site, premeds... Etc.Marland Kitchen

## 2015-04-01 NOTE — Progress Notes (Signed)
ANTICOAGULATION CONSULT NOTE - Follow Up Consult  Pharmacy Consult for Heparin  Indication:  Afib; s/p LHC on 6/14  Allergies  Allergen Reactions  . Codeine   . Morphine     REACTION: does not like    Patient Measurements: Height: 5\' 9"  (175.3 cm) Weight: 186 lb 8 oz (84.596 kg) IBW/kg (Calculated) : 70.7 Heparin Dosing Weight: 85.27 kg  Vital Signs: Temp: 97.6 F (36.4 C) (06/14 1021) Temp Source: Oral (06/14 1021) BP: 144/60 mmHg (06/14 1021) Pulse Rate: 56 (06/14 1021)  Labs:  Recent Labs  03/31/15 0745 03/31/15 1611 03/31/15 2100 04/01/15 0410 04/01/15 0616 04/01/15 0750  HGB 13.9  --   --   --  14.5  --   HCT 40.6  --   --   --  41.4  --   PLT 108*  --   --   --  137*  --   LABPROT  --  14.5  --   --  14.3  --   INR  --  1.11  --   --  1.09  --   HEPARINUNFRC  --   --  0.25*  --  0.31  --   CREATININE 2.16*  --   --  2.14*  --   --   TROPONINI  --   --   --   --   --  <0.03    Estimated Creatinine Clearance: 27.5 mL/min (by C-G formula based on Cr of 2.14).   Medical History: Past Medical History  Diagnosis Date  . CHF (congestive heart failure)   . Hypertension   . Hernia   . Other and unspecified diseases of appendix   . Pancreatitis   . Prostate ca 03/01/04    prostate bx=Adenocarcinoam,gleason 3+4=7,PSA=6.75  . Incontinence     hx  over 1 year,leaks without awareness  . Arthritis   . GERD (gastroesophageal reflux disease)   . Ulcer     hx gastric  . Hypercholesterolemia   . Heart murmur   . Chronic kidney disease     nephrolithiasis  . Cataract     surgery,B/L  . CAD (coronary artery disease)     Cath September 2015 LIMA to the LAD patent, SVG to PDA patent, SVG to posterior lateral patent, SVG to OM with a 90% in-stent restenosis at an anastomotic lesion. This was treated with angioplasty.  . Shortness of breath dyspnea   . Diabetes mellitus     TYPE 2    Medications:  See medication reconciliation   Assessment: 81 y.o male  known HTN, HLP, DM, CAD, s/p CABG, the last cath in 06/2014, SVG to OM with a 90% in-stent restenosis at an anastomotic lesion. This was treated with angioplasty. Admitted 03/31/15 AM due to recurrent chest pain for the last 3 days.  Pharmacy was consulted to start IV heparin infusion for ACS.   On admission, patient was found to have newly diagnosed Afib. Now s/p LHC. Resuming heparin infusion for Afib. H/H wnl. Plt low at 137    Goal of Therapy:  Heparin level 0.3-0.7 units/ml Monitor platelets by anticoagulation protocol: Yes   Plan:  Resume heparin at 1050 units/hr 8 hours post sheath pull. NO BOLUS  Heparin level 8 hours after resuming heparin  Daily heparin level and CBC F/u oral anticoagulation plans   Albertina Parr, PharmD., BCPS Clinical Pharmacist Pager [phone]

## 2015-04-01 NOTE — Progress Notes (Signed)
Site area: right groin  Site Prior to Removal:  Level 0  Pressure Applied For 20 MINUTES    Minutes Beginning at 1255  Manual:   Yes.    Patient Status During Pull:  AAO X 4  Post Pull Groin Site:  Level 0  Post Pull Instructions Given:  Yes.    Post Pull Pulses Present:  Yes.    Dressing Applied:  Yes.    Comments:  TOLERATED PROCEDURE WELL

## 2015-04-01 NOTE — Progress Notes (Signed)
Patient Name: [name] Date of Encounter: 04/01/2015  Primary Cardiologist: Dr Percival Spanish    Principal Problem:   Unstable angina Active Problems:   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   Dyslipidemia   Essential hypertension   CAD (coronary artery disease) of bypass graft   CKD (chronic kidney disease) stage 4, GFR 15-29 ml/min   LBBB (left bundle branch block)    SUBJECTIVE  CP improved. Denies any SOB.  CURRENT MEDS . amLODipine  10 mg Oral Daily  . aspirin  81 mg Oral Pre-Cath  . aspirin EC  81 mg Oral Daily  . carvedilol  12.5 mg Oral BID WC  . clopidogrel  75 mg Oral Daily  . linagliptin  5 mg Oral Daily  . pantoprazole  40 mg Oral Daily  . rosuvastatin  20 mg Oral Daily  . sodium chloride  3 mL Intravenous Q12H  . sodium chloride  3 mL Intravenous Q12H  . spironolactone  25 mg Oral Daily    OBJECTIVE  Filed Vitals:   04/01/15 0452 04/01/15 0453 04/01/15 0454 04/01/15 0455  BP:      Pulse: 62 64 70 66  Temp:      TempSrc:      Resp: 21 22 21 16   Height:      Weight:      SpO2: 100% 98% 100% 96%   No intake or output data in the 24 hours ending 04/01/15 0640 Filed Weights   03/31/15 0726 03/31/15 1548  Weight: 188 lb (85.276 kg) 187 lb 8 oz (85.049 kg)    PHYSICAL EXAM  General: Pleasant, NAD. Neuro: Alert and oriented X 3. Moves all extremities spontaneously. Psych: Normal affect. HEENT:  Normal  Neck: Supple without bruits or JVD. Lungs:  Resp regular and unlabored, CTA. Heart: RRR no s3, s4, or murmurs. Abdomen: Soft, non-tender, non-distended, BS + x 4.  Extremities: No clubbing, cyanosis or edema. DP/PT/Radials 2+ and equal bilaterally.  Accessory Clinical Findings  CBC  Recent Labs  03/31/15 0745  WBC 6.9  HGB 13.9  HCT 40.6  MCV 84.4  PLT 123XX123*   Basic Metabolic Panel  Recent Labs  03/31/15 0745 04/01/15 0410  NA 135 136  K 3.9 4.3  CL 102 102  CO2 21* 25  GLUCOSE 226* 196*  BUN 24* 24*   CREATININE 2.16* 2.14*  CALCIUM 9.0 9.4    TELE A-fib last night, converted to NSR around midnight.    ECG  No new EKG  Echocardiogram 07/17/2014  LV EF: 50% -  55%  ------------------------------------------------------------------- Indications:   Chest pain 786.51.  ------------------------------------------------------------------- History:  PMH: Mild aortic stenosis. Left bundle branch block. Diastolic dysfunction. Cardiomyopathy. Dyspnea. Risk factors: Diabetes mellitus. Dyslipidemia.  ------------------------------------------------------------------- Study Conclusions  - Left ventricle: The cavity size was normal. There was moderate concentric hypertrophy. Systolic function was normal. The estimated ejection fraction was in the range of 50% to 55%. Wall motion was normal; there were no regional wall motion abnormalities. Features are consistent with a pseudonormal left ventricular filling pattern, with concomitant abnormal relaxation and increased filling pressure (grade 2 diastolic dysfunction). Doppler parameters are consistent with elevated ventricular end-diastolic filling pressure. - Aortic valve: Trileaflet; moderately thickened, moderately calcified leaflets. Cusp separation was moderately reduced. Valve area (VTI): 2.82 cm^2. Valve area (Vmax): 3.22 cm^2. Valve area (Vmean): 2.79 cm^2. - Mitral valve: There was no regurgitation. - Left atrium: The atrium was mildly dilated. - Right ventricle: Systolic function was normal. -  Tricuspid valve: There was mild regurgitation. - Pulmonary arteries: Systolic pressure was within the normal range.  Impressions:  - Aortic valve is moderately calcified and thickened with restricted leaflet opening. However on the current study the transaortic gradients are only minimally elevated and most probably underestimated, only acquired in 1 view. An alternative study such as TEE  should be performed if clinically indicated.     Radiology/Studies  Dg Chest Port 1 View  03/31/2015   CLINICAL DATA:  Chest pain for 2 days.  EXAM: PORTABLE CHEST - 1 VIEW  COMPARISON:  07/16/2014.  FINDINGS: Mediastinum hilar structures are normal. Stable perihilar not densities consistent with vessels on end. No focal infiltrate. Low lung volumes. Mild stable pleural thickening.  IMPRESSION: 1. Prior CABG. Stable cardiomegaly. No evidence of congestive heart failure. 2. Low lung volumes.   Electronically Signed   By: Marcello Moores  Register   On: 03/31/2015 08:15    ASSESSMENT AND PLAN  81 yo male with PMH of CAD s/p CABG (last PCI 06/2014) present with CP, also noted to have rate controlled a-fib which is new   1. Unstable angina  - cath delayed yesterday due to scheduling, pending diagnostic cath today, if intervention needed, likely will be staged PCI at a later time given renal insufficiency  - risk and benefit of cath explained include bleeding, vascular/renal injury, MI, stroke, loss of life or limb, he agreed to proceed.   2. Newly diagnosed a-fib - converted overnight.  - CHA2DS2-Vasc score 4 (age, HTN, DM), renal insufficiency make NOAC poor choice  - continue IV heparin, if no underlying CAD to explain, will potentially start coumadin (which likely will substitute the ASA)  3. CAD s/p CABG 2009 (LIMA to LAD, SVG to PDA, SVG to posterolateral, SVG to OM)  - ISR to SVG to OM, last cath 06/2014 with PCI  4. Acute on chronic renal insufficiency  - Cr still 2.1 after hydration, baseline 1.7-1.8. Will need to limit contrast dye  5. Chronic diastolic HF  6. Bilateral carotid stenosis  - patient now has bilateral 60-79% stenosis. He is having this repeated again in June  7. HTN 8. HLD 9. DM  Signed, Almyra Deforest PA-C Pager: [phone]  Agree with note by Almyra Deforest PA-C  Admitted with [place]. CRI. S/P cath by Dr. Irish Lack with PCI/Stent LCX OM SVG with 60 cc contrast. Feeling better. No  CP. DAPT. Follow renal fxn. Given brief episode of AFIB in setting of ischemia I wonder whether he should be on coumadin + Plavix or ASA and plavix. Currently in NSR. I favor DAPT for now with OP event monitor for recurrent AFIB.    Lorretta Harp, M.D., [place], [facility], [place] [address] [place], [place]  [address]  [phone] 04/01/2015 11:38 AM

## 2015-04-02 ENCOUNTER — Other Ambulatory Visit: Payer: Self-pay | Admitting: Physician Assistant

## 2015-04-02 ENCOUNTER — Encounter (HOSPITAL_COMMUNITY): Payer: Self-pay | Admitting: Physician Assistant

## 2015-04-02 DIAGNOSIS — I480 Paroxysmal atrial fibrillation: Secondary | ICD-10-CM | POA: Diagnosis present

## 2015-04-02 LAB — BASIC METABOLIC PANEL
Anion gap: 10 (ref 5–15)
BUN: 19 mg/dL (ref 6–20)
CHLORIDE: 102 mmol/L (ref 101–111)
CO2: 24 mmol/L (ref 22–32)
Calcium: 9 mg/dL (ref 8.9–10.3)
Creatinine, Ser: 1.93 mg/dL — ABNORMAL HIGH (ref 0.61–1.24)
GFR calc Af Amer: 36 mL/min — ABNORMAL LOW (ref >60)
GFR calc non Af Amer: 31 mL/min — ABNORMAL LOW (ref >60)
GLUCOSE: 108 mg/dL — AB (ref 65–99)
Potassium: 4.2 mmol/L (ref 3.5–5.1)
Sodium: 136 mmol/L (ref 135–145)

## 2015-04-02 LAB — CBC
HEMATOCRIT: 37.4 % — AB (ref 39.0–52.0)
HEMOGLOBIN: 12.8 g/dL — AB (ref 13.0–17.0)
MCH: 28.7 pg (ref 26.0–34.0)
MCHC: 34.2 g/dL (ref 30.0–36.0)
MCV: 83.9 fL (ref 78.0–100.0)
PLATELETS: 127 10*3/uL — AB (ref 150–400)
RBC: 4.46 MIL/uL (ref 4.22–5.81)
RDW: 14.7 % (ref 11.5–15.5)
WBC: 9.9 10*3/uL (ref 4.0–10.5)

## 2015-04-02 LAB — GLUCOSE, CAPILLARY
Glucose-Capillary: 119 mg/dL — ABNORMAL HIGH (ref 65–99)
Glucose-Capillary: 120 mg/dL — ABNORMAL HIGH (ref 65–99)

## 2015-04-02 LAB — HEPARIN LEVEL (UNFRACTIONATED): Heparin Unfractionated: 0.27 IU/mL — ABNORMAL LOW (ref 0.30–0.70)

## 2015-04-02 MED ORDER — CARVEDILOL 12.5 MG PO TABS
12.5000 mg | ORAL_TABLET | Freq: Two times a day (BID) | ORAL | Status: DC
  Administered 2015-04-02: 10:00:00 12.5 mg via ORAL

## 2015-04-02 MED ORDER — ANGIOPLASTY BOOK
Freq: Once | Status: AC
  Administered 2015-04-02: 01:00:00
  Filled 2015-04-02: qty 1

## 2015-04-02 MED ORDER — CLOPIDOGREL BISULFATE 75 MG PO TABS: 75.0000 mg | ORAL_TABLET | Freq: Every day | ORAL | Status: DC

## 2015-04-02 MED ORDER — AMLODIPINE BESYLATE 10 MG PO TABS
10.0000 mg | ORAL_TABLET | Freq: Every day | ORAL | Status: DC
  Administered 2015-04-02: 10:00:00 10 mg via ORAL
  Filled 2015-04-02: qty 1

## 2015-04-02 MED FILL — Heparin Sodium (Porcine) 2 Unit/ML in Sodium Chloride 0.9%: INTRAMUSCULAR | Qty: 1000 | Status: AC

## 2015-04-02 NOTE — Progress Notes (Signed)
CARDIAC REHAB PHASE I   PRE:  Rate/Rhythm: 60 SR with LBBB    BP: sitting 149/69    SaO2:   MODE:  Ambulation: 700 ft   POST:  Rate/Rhythm: 73 SR    BP: sitting 148/65     SaO2:   Tolerated well, no c/o walking. Pt c/o chest pain after walking, going into his back, 2/10. Sts this has been random. Resolved after a few minutes and lying back in bed. No afib walking.  Gave Off the Beat book and discussed afib. Also discussed stent education, reinforced Plavix and NTG. Pt has historically not wanted to do CRPII. Wife present for education. [id number]  Josephina Shih Matteson CES, ACSM 04/02/2015 8:43 AM

## 2015-04-02 NOTE — Progress Notes (Signed)
[name] for Heparin  Indication:  Afib  Allergies  Allergen Reactions  . Codeine   . Morphine     REACTION: does not like    Patient Measurements: Height: 5\' 9"  (175.3 cm) Weight: 187 lb 6.3 oz (85 kg) IBW/kg (Calculated) : 70.7 Heparin Dosing Weight: 85.27 kg  Vital Signs: Temp: 98.2 F (36.8 C) (06/15 0038) Temp Source: Oral (06/15 0038) BP: 150/74 mmHg (06/15 0038) Pulse Rate: 61 (06/15 0038)  Labs:  Recent Labs  03/31/15 0745 03/31/15 1611 03/31/15 2100 04/01/15 0410 04/01/15 0616 04/01/15 0750 04/02/15 0300 04/02/15 0308  HGB 13.9  --   --   --  14.5  --   --  12.8*  HCT 40.6  --   --   --  41.4  --   --  37.4*  PLT 108*  --   --   --  137*  --   --  127*  LABPROT  --  14.5  --   --  14.3  --   --   --   INR  --  1.11  --   --  1.09  --   --   --   HEPARINUNFRC  --   --  0.25*  --  0.31  --  0.27*  --   CREATININE 2.16*  --   --  2.14*  --   --   --   --   TROPONINI  --   --   --   --   --  <0.03  --   --     Estimated Creatinine Clearance: 29.8 mL/min (by C-G formula based on Cr of 2.14).  Assessment: 81 y.o. male with Afib s/p cath for heparin  Goal of Therapy:  Heparin level 0.3-0.7 units/ml Monitor platelets by anticoagulation protocol: Yes   Plan:  Increase Heparin 1150 units/hr Check heparin level in 6 hours.   [name], PharmD, BCPS

## 2015-04-02 NOTE — Discharge Instructions (Signed)
No driving for 24 hours. No lifting over 5 lbs for 1 week. No sexual activity for 1 week. Keep procedure site clean & dry. If you notice increased pain, swelling, bleeding or pus, call/return!  You may shower, but no soaking baths/hot tubs/pools for 1 week.  ° ° °

## 2015-04-02 NOTE — Progress Notes (Signed)
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 Also had a bad pain below the L scapula, worse with palpation. No SOB.  CURRENT MEDS . aspirin  81 mg Oral Daily  . clopidogrel  75 mg Oral Q breakfast  . insulin aspart  0-9 Units Subcutaneous TID WC  . sodium chloride  3 mL Intravenous Q12H    OBJECTIVE  Filed Vitals:   04/01/15 1950 04/01/15 2000 04/02/15 0038 04/02/15 0727  BP: 117/68  150/74 149/69  Pulse: 61  61 61  Temp: 98 F (36.7 C)  98.2 F (36.8 C) 98.3 F (36.8 C)  TempSrc: Oral  Oral Oral  Resp: 18 15 13 22   Height:      Weight:   187 lb 6.3 oz (85 kg)   SpO2: 93%  95% 94%    Intake/Output Summary (Last 24 hours) at 04/02/15 0832 Last data filed at 04/02/15 0700  Gross per 24 hour  Intake 786.75 ml  Output    800 ml  Net -13.25 ml   Filed Weights   03/31/15 1548 04/01/15 0420 04/02/15 0038  Weight: 187 lb 8 oz (85.049 kg) 186 lb 8 oz (84.596 kg) 187 lb 6.3 oz (85 kg)    PHYSICAL EXAM  General: Pleasant, NAD. Neuro: Alert and oriented X 3. Moves all extremities spontaneously. Psych: Normal affect. HEENT:  Normal  Neck: Supple without bruits or JVD. Lungs:  Resp regular and unlabored, CTA. Heart: RRR no s3, s4, or murmurs. Abdomen: Soft, non-tender, non-distended, BS + x 4.  Extremities: No clubbing, cyanosis or edema. DP/PT/Radials 2+ and equal bilaterally.  Accessory Clinical Findings  CBC  Recent Labs  04/01/15 0616 04/02/15 0308  WBC 8.0 9.9  HGB 14.5 12.8*  HCT 41.4 37.4*    MCV 83.8 83.9  PLT 137* [id number]*   Basic Metabolic Panel  Recent Labs  04/01/15 0410 04/02/15 0308  NA 136 136  K 4.3 4.2  CL 102 102  CO2 25 24  GLUCOSE 196* 108*  BUN 24* 19  CREATININE 2.14* 1.93*  CALCIUM 9.4 9.0    TELE NSR overnight.    ECG  No new EKG  Echocardiogram 07/17/2014  LV EF: 50% -  55%  ------------------------------------------------------------------- Indications:   Chest pain 786.51.  ------------------------------------------------------------------- History:  PMH: Mild aortic stenosis. Left bundle branch block. Diastolic dysfunction. Cardiomyopathy. Dyspnea. Risk factors: Diabetes mellitus. Dyslipidemia.  ------------------------------------------------------------------- Study Conclusions  - Left ventricle: The cavity size was normal. There was moderate concentric hypertrophy. Systolic function was normal. The estimated ejection fraction was in the range of 50% to 55%. Wall motion was normal; there were no regional wall motion abnormalities. Features are consistent with a pseudonormal left ventricular filling pattern, with concomitant abnormal relaxation and increased filling pressure (grade 2 diastolic dysfunction). Doppler parameters are consistent with elevated ventricular end-diastolic filling pressure. - Aortic valve: Trileaflet; moderately thickened, moderately calcified leaflets. Cusp separation was moderately reduced. Valve area (VTI): 2.82 cm^2. Valve area (Vmax): 3.22 cm^2. Valve area (Vmean): 2.79 cm^2. -  Mitral valve: There was no regurgitation. - Left atrium: The atrium was mildly dilated. - Right ventricle: Systolic function was normal. - Tricuspid valve: There was mild regurgitation. - Pulmonary arteries: Systolic pressure was within the normal range.  Impressions:  - Aortic valve is moderately calcified and thickened with restricted leaflet opening. However on the current study  the transaortic gradients are only minimally elevated and most probably underestimated, only acquired in 1 view. An alternative study such as TEE should be performed if clinically indicated.     Radiology/Studies  Dg Chest Port 1 View  03/31/2015   CLINICAL DATA:  Chest pain for 2 days.  EXAM: PORTABLE CHEST - 1 VIEW  COMPARISON:  07/16/2014.  FINDINGS: Mediastinum hilar structures are normal. Stable perihilar not densities consistent with vessels on end. No focal infiltrate. Low lung volumes. Mild stable pleural thickening.  IMPRESSION: 1. Prior CABG. Stable cardiomegaly. No evidence of congestive heart failure. 2. Low lung volumes.   Electronically Signed   By: Marcello Moores  Register   On: 03/31/2015 08:15    ASSESSMENT AND PLAN  81 yo male with PMH of CAD s/p CABG (last PCI 06/2014) present with CP, also noted to have rate controlled a-fib which is new   1. Unstable angina  - cath 04/01/2015 95% ISR in SVG to OM treated with 2.75x24 Synergy DES postdilated to 3.22mm, patent LIMA to LAD, patent SVG to posterior lateral, patent SVG to PDA   - continue DAPT, discussed with Dr. Gwenlyn Found yesterday regarding new onset a-fib, felt maybe ischemic related. Wish to avoid triple therapy which can increase bleeding risk. Will do DAPT for now and place outpatient event monitor to check for recurrence of a-fib. Renal function stable post cath.   - restart coreg 12.5mg  BID, amlodipine 10mg  and spironolactone. D/C IV nitro. Stable for discharge.  2. Newly diagnosed a-fib - converted overnight.  - CHA2DS2-Vasc score 4 (age, HTN, DM), renal insufficiency make NOAC poor choice  - continue IV heparin, if no underlying CAD to explain  3. CAD s/p CABG 2009 (LIMA to LAD, SVG to PDA, SVG to posterolateral, SVG to OM)  - ISR to SVG to OM, last cath 06/2014 with PCI  4. Acute on chronic renal insufficiency  - Cr stable after cath  5. Chronic diastolic HF  6. Bilateral carotid stenosis  - patient now has  bilateral 60-79% stenosis. He is having this repeated again in June  7. HTN 8. HLD 9. DM  Signed, Almyra Deforest PA-C Pager: [phone] Agree with note by Almyra Deforest PA-C  S/P LCX OM PCI/Stent. DAPT. No CP. Labs OK. Exam benign. SCr stable. OK for DC home . 30 day event monitor to R/O silent PAF. F/U with Dr. Percival Spanish.   Lorretta Harp, M.D., [place], [facility], [place] [address] [place], [place]  [address]  [phone] 04/02/2015 9:34 AM

## 2015-04-02 NOTE — Discharge Summary (Signed)
Discharge Summary   Patient ID: [name],  MRN: [id number], DOB/AGE: [birth year] 81 y.o.  Admit date: 03/31/2015 Discharge date: 04/02/2015  Primary Care Provider: Eulas [name] Primary Cardiologist: Dr. 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 Codeine   . Morphine     REACTION: does not like    Procedures  Cardiac catheterization 04/01/2015 Cnclusion     Dist Graft to Insertion into the native obtuse marginal had a severe lesion, in-stent restenosis, 95% stenosed. This area was treated with a 2.75 x 24 Synergy drug-eluting stent, postdilated to 3.3 mm in diameter. There is a 0% residual stenosis [name] intervention. The lesion was previously treated with a stent (unknown type) greater than two years ago.  Severe native three-vessel coronary artery disease. RCA was documented occluded by prior cath. This was not injected to save contrast. Patent LIMA to LAD. Patent SVG to posterior lateral artery. Patent SVG to PDA.  A drug-eluting stent was placed.  Continue clopidogrel for antiplatelets therapy. Will restart aspirin for now. He will likely be started on Coumadin for atrial fibrillation. Once his Coumadin is therapeutic, will likely stop aspirin. Long-term, he should be on a combination of Coumadin and clopidogrel, along with aggressive secondary prevention.  Only 60 mL of dye was used for the entire procedure. The patient will be aggressively hydrated postprocedure due to his renal insufficiency.       Hospital Course  The patient is a 81 year old male with past medical history of HTN, HLP,  DM, CAD, s/p CABG 2009 (LIMA to LAD, SVG to PDA, SVG to posterolateral, SVG to OM). His last cardiac catheterization was in September 2015 at which time the mental LAD was patent, SVG to PDA patent, SVG to posterolateral patent, SVG to OM had 90% in-stent restenosis at an anastomotic lesion this was treated with angioplasty. He presented to [facility] on 03/31/2015 with recurrent chest pain which has been ongoing intermittently for 3 days. He has been compliant with aspirin and Plavix therapy as well as Crestor and carvedilol. After discussing with patient various options, it was decided for him to undergo diagnostic relook catheterization. Benefit and risk was explained to the patient he'll agree to proceed with the procedure. It was also noted the patient was in atrial fibrillation on arrival, this is new for him. However his heart rate was controlled on home meds. He spontaneous converted to NSR overnight on the first day of admission.  Given his significant chronic kidney disease, his Lasix was held, he was hydrated overnight in preparation for the procedure. He underwent cardiac catheterization in the morning of 04/01/2015 which showed a 95% in-stent restenosis E SVG to OM, treated with 2.75 x 24 mm Synergy DES postdilated to 3.3 mm. He had patent LIMA to LAD, patent SVG to posterolateral, patent SVG to PDA. He was seen in the following morning at which time he was doing well without significant chest discomfort. He has mild chest soreness after cardiac cath, however it was very mild and not similar to what he came in with. He has mild back discomfort worse with palpation near the left subscapular  region which is likely musculoskeletal. He is deemed stable for discharge from cardiology perspective. Prior to discharge, I have discussed with Dr. Gwenlyn [name] regarding whether or not to initiate systemic anticoagulation in this patient who just received a drug-eluting stent. It was felt that his atrial  fibrillation likely occurred in the setting of ischemia, we will hold off on initiating systemic anticoagulation therapy at this time. I will arrange for 30 days event monitor after discharge to monitor for any recurrence of atrial fibrillation. If there is any recurrence, patient is likely going to require Coumadin given his renal function and DAPT requirement. He has been seen by cardiac rehabilitation. I have arrange followup with Dr. Percival [name]   Discharge Vitals Blood pressure 148/65, pulse 66, temperature 98 F (36.7 C), temperature source Oral, resp. rate 17, height 5\' 9"  (1.753 m), weight 187 lb 6.3 oz (85 kg), SpO2 94 %.  Filed Weights   03/31/15 1548 04/01/15 0420 04/02/15 0038  Weight: 187 lb 8 oz (85.049 kg) 186 lb 8 oz (84.596 kg) 187 lb 6.3 oz (85 kg)    Labs  CBC  Recent Labs  04/01/15 0616 04/02/15 0308  WBC 8.0 9.9  HGB 14.5 12.8*  HCT 41.4 37.4*  MCV 83.8 83.9  PLT 137* [id number]*   Basic Metabolic Panel  Recent Labs  04/01/15 0410 04/02/15 0308  NA 136 136  K 4.3 4.2  CL 102 102  CO2 25 24  GLUCOSE 196* 108*  BUN 24* 19  CREATININE 2.14* 1.93*  CALCIUM 9.4 9.0   Cardiac Enzymes  Recent Labs  04/01/15 0750  TROPONINI <0.03    Disposition  Pt is being discharged home today in good condition.  Follow-up Plans & Appointments      Follow-up Information    Follow up with [facility] On 04/03/2015.   Specialty:  Cardiology   Why:  3:30pm. 30 day event monitor to check for recurrent atrial fibrillation   Contact information:   [address] [place] [phone]      Follow up with Shawn Breeding, MD On 05/09/2015.   Specialty:  Cardiology   Why:  2:00pm   Contact information:   [place] [address] [phone]       Discharge Medications    Medication List    TAKE these medications        ALPRAZolam 1 MG tablet  Commonly known as:  XANAX  TAKE 1 TABLET  3 TIMES A DAY AS NEEDED     amLODipine 10 MG tablet  Commonly known as:  NORVASC  TAKE 1 TABLET EVERY DAY     aspirin 81 MG EC tablet  Take 1 tablet (81 mg total) by mouth daily.     carvedilol 12.5 MG tablet  Commonly known as:  COREG  Take 1 tablet (12.5 mg total) by mouth 2 (two) times daily with a meal.     clopidogrel 75 MG tablet  Commonly known as:  PLAVIX  Take 1 tablet (75 mg total) by mouth daily.     CRESTOR 20 MG tablet  Generic drug:  rosuvastatin  TAKE 1 TABLET (20 MG TOTAL) BY MOUTH DAILY.     freestyle lancets  1 each by Other route as needed. Use as instructed     FREESTYLE LITE test strip  Generic drug:  glucose blood  1 each by Other route as needed. Use as instructed     furosemide 40 MG tablet  Commonly known as:  LASIX  TAKE 1 TABLET BY MOUTH EVERY DAY     JANUVIA 50 MG tablet  Generic drug:  sitaGLIPtin  TAKE 1 TABLET BY MOUTH EVERY DAY     NITROSTAT 0.4 MG SL tablet  Generic drug:  nitroGLYCERIN  USE AS DIRECTED     pantoprazole 40 MG tablet  Commonly known as:  PROTONIX  TAKE 1 TABLET BY MOUTH EVERY DAY     spironolactone 25 MG tablet  Commonly known as:  ALDACTONE  Take 25 mg by mouth daily.     traMADol 50 MG tablet  Commonly known as:  ULTRAM  Take 1 tablet (50 mg total) by mouth every 6 (six) hours as needed. 1-2 every 6 h prn pain        Outstanding Labs/Studies  Outpatient 30 day event monitor  Duration of Discharge Encounter   Greater than 30 minutes including physician time.  Hilbert Corrigan PA-C Pager: [phone] 04/02/2015, 11:49 AM

## 2015-04-03 ENCOUNTER — Encounter (INDEPENDENT_AMBULATORY_CARE_PROVIDER_SITE_OTHER): Payer: Medicare PPO | Admitting: Cardiology

## 2015-04-03 DIAGNOSIS — I480 Paroxysmal atrial fibrillation: Secondary | ICD-10-CM

## 2015-04-03 NOTE — Progress Notes (Signed)
Pt set up for 30 day event monitor.  All use and maintenance explained. Pt teachback done.

## 2015-04-03 NOTE — Patient Instructions (Signed)
Cardiac Event Monitoring A cardiac event monitor is a small recording device used to help detect abnormal heart rhythms (arrhythmias). The monitor is used to record heart rhythm when noticeable symptoms such as the following occur:  Fast heartbeats (palpitations), such as heart racing or fluttering.  Dizziness.  Fainting or light-headedness.  Unexplained weakness. The monitor is wired to two electrodes placed on your chest. Electrodes are flat, sticky disks that attach to your skin. The monitor can be worn for up to 30 days. You will wear the monitor at all times, except when bathing.  HOW TO USE YOUR CARDIAC EVENT MONITOR A technician will prepare your chest for the electrode placement. The technician will show you how to place the electrodes, how to work the monitor, and how to replace the batteries. Take time to practice using the monitor before you leave the office. Make sure you understand how to send the information from the monitor to your health care provider. This requires a telephone with a landline, not a cell phone. You need to:  Wear your monitor at all times, except when you are in water:  Do not get the monitor wet.  Take the monitor off when bathing. Do not swim or use a hot tub with it on.  Keep your skin clean. Do not put body lotion or moisturizer on your chest.  Change the electrodes daily or any time they stop sticking to your skin. You might need to use tape to keep them on.  It is possible that your skin under the electrodes could become irritated. To keep this from happening, try to put the electrodes in slightly different places on your chest. However, they must remain in the area under your left breast and in the upper right section of your chest.  Make sure the monitor is safely clipped to your clothing or in a location close to your body that your health care provider recommends.  Press the button to record when you feel symptoms of heart trouble, such as  dizziness, weakness, light-headedness, palpitations, thumping, shortness of breath, unexplained weakness, or a fluttering or racing heart. The monitor is always on and records what happened slightly before you pressed the button, so do not worry about being too late to get good information.  Keep a diary of your activities, such as walking, doing chores, and taking medicine. It is especially important to note what you were doing when you pushed the button to record your symptoms. This will help your health care provider determine what might be contributing to your symptoms. The information stored in your monitor will be reviewed by your health care provider alongside your diary entries.  Send the recorded information as recommended by your health care provider. It is important to understand that it will take some time for your health care provider to process the results.  Change the batteries as recommended by your health care provider. SEEK IMMEDIATE MEDICAL CARE IF:   You have chest pain.  You have extreme difficulty breathing or shortness of breath.  You develop a very fast heartbeat that persists.  You develop dizziness that does not go away.  You faint or constantly feel you are about to faint. Document Released: 07/13/2008 Document Revised: 02/18/2014 Document Reviewed: 04/02/2013 Exit[employer] Patient Information 2015 [employer]. This information is not intended to replace advice given to you by your health care provider. Make sure you discuss any questions you have with your health care provider.  

## 2015-04-09 ENCOUNTER — Other Ambulatory Visit: Payer: Self-pay | Admitting: Cardiology

## 2015-04-09 NOTE — Telephone Encounter (Signed)
REFILL 

## 2015-04-16 ENCOUNTER — Other Ambulatory Visit: Payer: Self-pay | Admitting: Cardiology

## 2015-04-16 NOTE — Telephone Encounter (Signed)
REFILL 

## 2015-04-29 ENCOUNTER — Telehealth: Payer: Self-pay | Admitting: *Deleted

## 2015-04-29 NOTE — Telephone Encounter (Signed)
20 day Peters phone call to patient and Wife. Patient continues on same discharge Meds. Patient has 30 day event monitor on and states f/u appointment on 05/09/15. Patient has had "the same chest pain and back pain post procedure. He states he actually feels better today no pain. Gave patient and wife detailed message to call 911 or office if pain reoccurs and unrelieved with ntg. Patient and wife verbalized understanding and questions encouraged.

## 2015-05-01 ENCOUNTER — Other Ambulatory Visit: Payer: Self-pay | Admitting: Family Medicine

## 2015-05-06 ENCOUNTER — Encounter: Payer: Self-pay | Admitting: Gastroenterology

## 2015-05-09 ENCOUNTER — Ambulatory Visit (INDEPENDENT_AMBULATORY_CARE_PROVIDER_SITE_OTHER): Payer: Medicare PPO | Admitting: Cardiology

## 2015-05-09 ENCOUNTER — Encounter: Payer: Self-pay | Admitting: Cardiology

## 2015-05-09 VITALS — BP 130/62 | HR 57 | Ht 68.0 in | Wt 190.0 lb

## 2015-05-09 DIAGNOSIS — I25700 Atherosclerosis of coronary artery bypass graft(s), unspecified, with unstable angina pectoris: Secondary | ICD-10-CM | POA: Diagnosis not present

## 2015-05-09 DIAGNOSIS — I350 Nonrheumatic aortic (valve) stenosis: Secondary | ICD-10-CM

## 2015-05-09 NOTE — Progress Notes (Signed)
HPI The patient presents for followup of CAD.   Since I last saw him he was hospitalized.   He had chest discomfort. He did undergo cardiac catheterization on 04/01/2015 which showed a 95% in-stent restenosis in SVG to OM, treated with 2.75 x 24 mm Synergy DES postdilated to 3.3 mm. He had patent LIMA to LAD, patent SVG to posterolateral, patent SVG to PDA.    Of note he did have atrial fibrillation with rate control on presentation but he converted spontaneously. He was not started on warfarin because he was taking aspirin and Plavix. He wore an event monitor.    After the procedure he continued to have chest discomfort that actually did not prove despite stenting. He is a sharp pain that radiates around to his back on his left side. He said it has slowly improved. It's now almost gon. He's not noticed any palpitations, presyncope or syncope.  He denies any new shortness of breath, PND or orthopnea.  Allergies  Allergen Reactions  . Codeine   . Morphine     REACTION: does not like    Current Outpatient Prescriptions  Medication Sig Dispense Refill  . ALPRAZolam (XANAX) 1 MG tablet TAKE 1 TABLET 3 TIMES A DAY AS NEEDED 90 tablet 5  . amLODipine (NORVASC) 10 MG tablet TAKE 1 TABLET EVERY DAY 30 tablet 5  . aspirin EC 81 MG EC tablet Take 1 tablet (81 mg total) by mouth daily.    . carvedilol (COREG) 12.5 MG tablet Take 1 tablet (12.5 mg total) by mouth 2 (two) times daily with a meal. 60 tablet 6  . clopidogrel (PLAVIX) 75 MG tablet Take 1 tablet (75 mg total) by mouth daily. 30 tablet 11  . CRESTOR 20 MG tablet TAKE 1 TABLET (20 MG TOTAL) BY MOUTH DAILY. 30 tablet 5  . furosemide (LASIX) 40 MG tablet TAKE 1 TABLET BY MOUTH EVERY DAY 30 tablet 10  . glucose blood (FREESTYLE LITE) test strip 1 each by Other route as needed. Use as instructed     . JANUVIA 50 MG tablet TAKE 1 TABLET BY MOUTH EVERY DAY 30 tablet 5  . Lancets (FREESTYLE) lancets 1 each by Other route as needed. Use as instructed      . NITROSTAT 0.4 MG SL tablet USE AS DIRECTED 25 tablet 3  . pantoprazole (PROTONIX) 40 MG tablet TAKE 1 TABLET BY MOUTH EVERY DAY 30 tablet 11  . spironolactone (ALDACTONE) 25 MG tablet TAKE 1/2 TABLET BY MOUTH 2 TIMES A DAY (Patient taking differently: TAKE 1 TABLET BY MOUTH  ONCE A DAY) 30 tablet 0  . traMADol (ULTRAM) 50 MG tablet Take 1 tablet (50 mg total) by mouth every 6 (six) hours as needed. 1-2 every 6 h prn pain 120 tablet 5   No current facility-administered medications for this visit.    Past Medical History  Diagnosis Date  . CHF (congestive heart failure)   . Hypertension   . Hernia   . Other and unspecified diseases of appendix   . Pancreatitis   . Prostate ca 03/01/04    prostate bx=Adenocarcinoam,gleason 3+4=7,PSA=6.75  . Incontinence     hx  over 1 year,leaks without awareness  . Arthritis   . GERD (gastroesophageal reflux disease)   . Ulcer     hx gastric  . Hypercholesterolemia   . Heart murmur   . Chronic kidney disease     nephrolithiasis  . Cataract     surgery,B/L  . CAD (coronary  artery disease)     a. Cath September 2015 LIMA to the LAD patent, SVG to PDA patent, SVG to posterior lateral patent, SVG to OM with a 90% in-stent restenosis at an anastomotic lesion. This was treated with angioplasty. b. cath 04/01/2015 95% ISR in SVG to OM treated with 2.75x24 Synergy DES postdilated to 3.37mm, all other grafts patent  . Shortness of breath dyspnea   . Diabetes mellitus     TYPE 2  . PAF (paroxysmal atrial fibrillation)     in the setting of ischemia on 03/31/2015, holding off on starting coumadin after received DAPT, will monitor for recurrence with 30 day event monitor    Past Surgical History  Procedure Laterality Date  . Appendectomy    . Hernia repair    . Carpal tunnel release  2004    both hands  . Laparoscopic cholecystectomy  09/2009  . Incision and drainage of right chest abscess    . Coronary artery bypass graft    . Robot assisted  laparoscopic radical prostatectomy  2005  . Back surgery    . Rectal surgery    . Cystoscopy  08/23/12    incomplete emoptying bladder  . Left heart catheterization with coronary angiogram N/A 07/19/2014    Procedure: LEFT HEART CATHETERIZATION WITH CORONARY ANGIOGRAM;  Surgeon: Leonie Man, MD;  Location: [facility] CATH LAB;  Service: Cardiovascular;  Laterality: N/A;  . Cardiac catheterization N/A 04/01/2015    Procedure: Left Heart Cath and Cors/Grafts Angiography;  Surgeon: Jettie Booze, MD;  Location: [place] CV LAB;  Service: Cardiovascular;  Laterality: N/A;  . Cardiac catheterization N/A 04/01/2015    Procedure: Coronary Stent Intervention;  Surgeon: Jettie Booze, MD;  Location: [place] CV LAB;  Service: Cardiovascular;  Laterality: N/A;    ROS:  Positive for cold intolerance. Otherwise as stated in the HPI and negative for all other systems.  PHYSICAL EXAM BP 130/62 mmHg  Pulse 57  Ht 5\' 8"  (1.727 m)  Wt 190 lb (86.183 kg)  BMI 28.90 kg/m2 GENERAL:  Well appearing HEENT:  Pupils equal round and reactive, fundi not visualized, oral mucosa unremarkable NECK:  No jugular venous distention, waveform within normal limits, carotid upstroke brisk and symmetric, no bruits, no thyromegaly LUNGS:  Clear to auscultation bilaterally BACK:  No CVA tenderness CHEST:  Well healed sternotomy scar. HEART:  PMI not displaced or sustained,S1 and S2 within normal limits, no S3, no S4, no clicks, no rubs, apical systolic early peaking radiating slightly to the axilla murmur ABD:  Flat, positive bowel sounds normal in frequency in pitch, no bruits, no rebound, no guarding, no midline pulsatile mass, no hepatomegaly, no splenomegaly EXT:  2 plus pulses throughout, no edema, no cyanosis no clubbing  EKG:   Sinus bradycardia, rate 57, left bundle branch block, left anterior fascicular block 05/09/2015  ASSESSMENT AND PLAN   CORONARY ARTERY DISEASE  I have extensively reviewed his  hospital records and his recent angioplasty. Given the extent of his disease I will continue him on dual antiplatelet therapy.  Other anticoagulation therapy is as addressed below.  Atrial fib I had a long discussion with the patient and his wife. I reviewed his event monitor. He did have very short episodes of atrial fibrillation. However, this point I think the risk of bleeding implant warfarin Plavix and discontinue the aspirin is fairly high. Paroxysms of very brief. He understands that without warfarin he is at risk for stroke that I think this risk  is lower than the risk of bleeding with combination therapy. However, in the future if he has recurrent tachycardia palpitations and documented sustained fibrillation I would change to warfarin and stop the aspirin.  Hypertension  The blood pressure is at target. No change in medications is indicated. We will continue with therapeutic lifestyle changes (TLC).  Carotid stenosis-Bilateral  The patient now has bilateral 60-79% stenosis.  He had a Doppler last month.   Bradycardia This has been evaluated in the past and there were no sustained or symptomatic bradycardia arrhythmias.  CKD: His creatinine was 1.93 a discharge which was baseline. We will follow this with future chemistries.  Hospital records reviewed.

## 2015-05-09 NOTE — Patient Instructions (Addendum)
Your physician wants you to follow-up in: 3 Month. You will receive a reminder letter in the mail two months in advance. If you don't receive a letter, please call our office to schedule the follow-up appointment.

## 2015-05-14 ENCOUNTER — Other Ambulatory Visit: Payer: Self-pay | Admitting: Cardiology

## 2015-06-11 DIAGNOSIS — C61 Malignant neoplasm of prostate: Secondary | ICD-10-CM | POA: Diagnosis not present

## 2015-06-18 DIAGNOSIS — N3946 Mixed incontinence: Secondary | ICD-10-CM | POA: Diagnosis not present

## 2015-06-18 DIAGNOSIS — C61 Malignant neoplasm of prostate: Secondary | ICD-10-CM | POA: Diagnosis not present

## 2015-06-18 DIAGNOSIS — R339 Retention of urine, unspecified: Secondary | ICD-10-CM | POA: Diagnosis not present

## 2015-06-28 ENCOUNTER — Other Ambulatory Visit: Payer: Self-pay | Admitting: Family Medicine

## 2015-07-10 ENCOUNTER — Encounter: Payer: Self-pay | Admitting: Gastroenterology

## 2015-07-27 ENCOUNTER — Other Ambulatory Visit: Payer: Self-pay | Admitting: Family Medicine

## 2015-07-30 ENCOUNTER — Other Ambulatory Visit: Payer: Self-pay | Admitting: *Deleted

## 2015-07-30 MED ORDER — FUROSEMIDE 40 MG PO TABS: 40.0000 mg | ORAL_TABLET | Freq: Every day | ORAL | Status: DC

## 2015-07-30 NOTE — Telephone Encounter (Signed)
Rx(s) sent to pharmacy electronically.  

## 2015-07-31 ENCOUNTER — Other Ambulatory Visit: Payer: Self-pay | Admitting: Cardiology

## 2015-08-13 ENCOUNTER — Encounter: Payer: Self-pay | Admitting: Cardiology

## 2015-08-13 ENCOUNTER — Ambulatory Visit (INDEPENDENT_AMBULATORY_CARE_PROVIDER_SITE_OTHER): Payer: Medicare PPO | Admitting: Cardiology

## 2015-08-13 VITALS — BP 126/68 | HR 54 | Ht 69.0 in | Wt 192.6 lb

## 2015-08-13 DIAGNOSIS — R5383 Other fatigue: Secondary | ICD-10-CM

## 2015-08-13 DIAGNOSIS — Z79899 Other long term (current) drug therapy: Secondary | ICD-10-CM | POA: Diagnosis not present

## 2015-08-13 LAB — BASIC METABOLIC PANEL
BUN: 26 mg/dL — ABNORMAL HIGH (ref 7–25)
CALCIUM: 9.8 mg/dL (ref 8.6–10.3)
CO2: 23 mmol/L (ref 20–31)
CREATININE: 1.91 mg/dL — AB (ref 0.70–1.11)
Chloride: 103 mmol/L (ref 98–110)
Glucose, Bld: 139 mg/dL — ABNORMAL HIGH (ref 65–99)
Potassium: 4.7 mmol/L (ref 3.5–5.3)
Sodium: 137 mmol/L (ref 135–146)

## 2015-08-13 LAB — TSH: TSH: 6.088 u[IU]/mL — AB (ref 0.350–4.500)

## 2015-08-13 LAB — CBC
HCT: 41.3 % (ref 39.0–52.0)
Hemoglobin: 14.5 g/dL (ref 13.0–17.0)
MCH: 31.4 pg (ref 26.0–34.0)
MCHC: 35.1 g/dL (ref 30.0–36.0)
MCV: 89.4 fL (ref 78.0–100.0)
MPV: 10 fL (ref 8.6–12.4)
PLATELETS: 165 10*3/uL (ref 150–400)
RBC: 4.62 MIL/uL (ref 4.22–5.81)
RDW: 13.1 % (ref 11.5–15.5)
WBC: 10 10*3/uL (ref 4.0–10.5)

## 2015-08-13 NOTE — Patient Instructions (Signed)
Your physician recommends that you return for lab work.  -- TSH, CBC, BMET  Your physician wants you to follow-up in: 1 year with Dr. Percival Spanish.  You will receive a reminder letter in the mail two months in advance. If you don't receive a letter, please call our office to schedule the follow-up appointment.  If you need a refill on your cardiac medications before your next appointment, please call your pharmacy.

## 2015-08-13 NOTE — Progress Notes (Signed)
HPI The patient presents for followup of CAD.   He had chest pain and had a cardiac catheterization on 04/01/2015 which showed a 95% in-stent restenosis in SVG to OM, treated with 2.75 x 24 mm Synergy DES postdilated to 3.3 mm. He had patent LIMA to LAD, patent SVG to posterolateral, patent SVG to PDA.    Of note he did have atrial fibrillation with rate control on presentation but he converted spontaneously. He was not started on warfarin because he was taking aspirin and Plavix. He wore an event monitor.    After the procedure he continued to have chest discomfort that actually did not improve despite stenting. However, over time this has improved.  He denies any chest pain, palpitations, presyncope or syncope.  He does get SOB with activity and this has been chronic.  He is not describing PND or orthopnea. He does have chronic fatigue. He can still do activities such as cutting and splitting wood when he had the rash frequently.  He's not had any palpitations since wearing an event monitor earlier this year.  Allergies  Allergen Reactions  . Codeine   . Morphine     REACTION: does not like    Current Outpatient Prescriptions  Medication Sig Dispense Refill  . ALPRAZolam (XANAX) 1 MG tablet TAKE 1 TABLET 3 TIMES A DAY AS NEEDED 90 tablet 5  . amLODipine (NORVASC) 10 MG tablet TAKE 1 TABLET EVERY DAY 30 tablet 5  . aspirin EC 81 MG EC tablet Take 1 tablet (81 mg total) by mouth daily.    . carvedilol (COREG) 12.5 MG tablet TAKE 1 TABLET BY MOUTH TWICE A DAY WITH A MEAL 60 tablet 6  . clopidogrel (PLAVIX) 75 MG tablet Take 1 tablet (75 mg total) by mouth daily. 30 tablet 11  . CRESTOR 20 MG tablet TAKE 1 TABLET BY MOUTH EVERY DAY 30 tablet 5  . furosemide (LASIX) 40 MG tablet Take 1 tablet (40 mg total) by mouth daily. 90 tablet 2  . glucose blood (FREESTYLE LITE) test strip 1 each by Other route as needed. Use as instructed     . JANUVIA 50 MG tablet TAKE 1 TABLET BY MOUTH EVERY DAY 30  tablet 5  . Lancets (FREESTYLE) lancets 1 each by Other route as needed. Use as instructed     . NITROSTAT 0.4 MG SL tablet USE AS DIRECTED 25 tablet 3  . pantoprazole (PROTONIX) 40 MG tablet TAKE 1 TABLET BY MOUTH EVERY DAY 30 tablet 11  . spironolactone (ALDACTONE) 25 MG tablet TAKE 1/2 TABLET BY MOUTH 2 TIMES A DAY 30 tablet 3  . traMADol (ULTRAM) 50 MG tablet Take 1 tablet (50 mg total) by mouth every 6 (six) hours as needed. 1-2 every 6 h prn pain 120 tablet 5   No current facility-administered medications for this visit.    Past Medical History  Diagnosis Date  . CHF (congestive heart failure)   . Hypertension   . Hernia   . Other and unspecified diseases of appendix   . Pancreatitis   . Prostate ca 03/01/04    prostate bx=Adenocarcinoam,gleason 3+4=7,PSA=6.75  . Incontinence     hx  over 1 year,leaks without awareness  . Arthritis   . GERD (gastroesophageal reflux disease)   . Ulcer     hx gastric  . Hypercholesterolemia   . Heart murmur   . Chronic kidney disease     nephrolithiasis  . Cataract     surgery,B/L  .  CAD (coronary artery disease)     a. Cath September 2015 LIMA to the LAD patent, SVG to PDA patent, SVG to posterior lateral patent, SVG to OM with a 90% in-stent restenosis at an anastomotic lesion. This was treated with angioplasty. b. cath 04/01/2015 95% ISR in SVG to OM treated with 2.75x24 Synergy DES postdilated to 3.23mm, all other grafts patent  . Shortness of breath dyspnea   . Diabetes mellitus     TYPE 2  . PAF (paroxysmal atrial fibrillation)     in the setting of ischemia on 03/31/2015, holding off on starting coumadin after received DAPT, will monitor for recurrence with 30 day event monitor    Past Surgical History  Procedure Laterality Date  . Appendectomy    . Hernia repair    . Carpal tunnel release  2004    both hands  . Laparoscopic cholecystectomy  09/2009  . Incision and drainage of right chest abscess    . Coronary artery bypass  graft    . Robot assisted laparoscopic radical prostatectomy  2005  . Back surgery    . Rectal surgery    . Cystoscopy  08/23/12    incomplete emoptying bladder  . Left heart catheterization with coronary angiogram N/A 07/19/2014    Procedure: LEFT HEART CATHETERIZATION WITH CORONARY ANGIOGRAM;  Surgeon: Leonie Man, MD;  Location: [facility] CATH LAB;  Service: Cardiovascular;  Laterality: N/A;  . Cardiac catheterization N/A 04/01/2015    Procedure: Left Heart Cath and Cors/Grafts Angiography;  Surgeon: Jettie Booze, MD;  Location: [place] CV LAB;  Service: Cardiovascular;  Laterality: N/A;  . Cardiac catheterization N/A 04/01/2015    Procedure: Coronary Stent Intervention;  Surgeon: Jettie Booze, MD;  Location: [place] CV LAB;  Service: Cardiovascular;  Laterality: N/A;    ROS:  Positive for cold intolerance. Otherwise as stated in the HPI and negative for all other systems.  PHYSICAL EXAM BP 126/68 mmHg  Pulse 54  Ht 5\' 9"  (1.753 m)  Wt 192 lb 9.6 oz (87.363 kg)  BMI 28.43 kg/m2 GENERAL:  Well appearing HEENT:  Pupils equal round and reactive, fundi not visualized, oral mucosa unremarkable NECK:  No jugular venous distention, waveform within normal limits, carotid upstroke brisk and symmetric, no bruits, no thyromegaly LUNGS:  Clear to auscultation bilaterally BACK:  No CVA tenderness CHEST:  Well healed sternotomy scar. HEART:  PMI not displaced or sustained,S1 and S2 within normal limits, no S3, no S4, no clicks, no rubs, apical systolic early peaking radiating slightly to the axilla murmur ABD:  Flat, positive bowel sounds normal in frequency in pitch, no bruits, no rebound, no guarding, no midline pulsatile mass, no hepatomegaly, no splenomegaly EXT:  2 plus pulses throughout, no edema, no cyanosis no clubbing   ASSESSMENT AND PLAN   CORONARY ARTERY DISEASE  The patient has no new sypmtoms.  No further cardiovascular testing is indicated.  We will continue  with aggressive risk reduction and meds as listed.  Atrial fib I had a long discussion with the patient and his wife. I previously reviewed his event monitor. He did have very short episodes of atrial fibrillation. However, this point I think the risk of bleeding with warfarin and Plavix would be fairly high. Paroxysms are very brief. He understands that without warfarin he is at risk for stroke that I think this risk is lower than the risk of bleeding with combination therapy. Therefore, again I will continue his current combination until June of  next year when I might then reevaluate for atrial fibrillation and consider switching to warfarin. I will be checking a CBC and basic metabolic profile. Of note [facility] recently told him there was something off of his thyroid labs but I don't have these. I will repeat a TSH.  Hypertension  The blood pressure is at target. No change in medications is indicated. We will continue with therapeutic lifestyle changes (TLC).  Carotid stenosis-Bilateral  The patient now has bilateral 60-79% stenosis.  He is due to have follow-up of this in June of next year.  Bradycardia This has been evaluated in the past and there were no sustained or symptomatic bradycardia arrhythmias.  CKD: His creatinine was 1.93 a discharge which was baseline.  Again I will follow-up with a basic metabolic profile.  Hospital records reviewed.

## 2015-08-15 ENCOUNTER — Telehealth: Payer: Self-pay | Admitting: Family Medicine

## 2015-08-15 NOTE — Telephone Encounter (Signed)
Patient requesting refill on Alprazolam (Xanax) 1 mg tablet #90 -- okay to refill?   Last seen: 12/04/14 Last refill: 01/14/15 #90 with 5 refills

## 2015-08-16 NOTE — Telephone Encounter (Signed)
May refill.  He came to me on this medication and I would really like to see him taper back, if possible.  Benzos associated with higher risk of falls in elderly.  It would be a start if he could even taper back to one half tablet per dose.

## 2015-08-18 ENCOUNTER — Telehealth: Payer: Self-pay | Admitting: Cardiology

## 2015-08-18 NOTE — Telephone Encounter (Signed)
Calling to get results of test . Please call   Thanks

## 2015-08-18 NOTE — Telephone Encounter (Signed)
Returned call to patient and his wife answered, whom I spoke with on Friday. She states I spoke with her daughter. I informed her that there is no contact info for her daughter in our system, nor is there a DPR on file for patient. She was very insistent that she receive the lab results as she states her husband often does not understand them. Informed her what a DPR is and that I will provide the results and mail them a DPR to complete and send back to our office. She voiced understanding.

## 2015-08-20 NOTE — Telephone Encounter (Signed)
Rx was called in 

## 2015-08-31 ENCOUNTER — Other Ambulatory Visit: Payer: Self-pay | Admitting: Cardiology

## 2015-10-14 DIAGNOSIS — M25511 Pain in right shoulder: Secondary | ICD-10-CM | POA: Diagnosis not present

## 2015-10-14 DIAGNOSIS — M79641 Pain in right hand: Secondary | ICD-10-CM | POA: Diagnosis not present

## 2015-10-14 DIAGNOSIS — L03113 Cellulitis of right upper limb: Secondary | ICD-10-CM | POA: Diagnosis not present

## 2015-10-14 DIAGNOSIS — M25531 Pain in right wrist: Secondary | ICD-10-CM | POA: Diagnosis not present

## 2015-10-16 DIAGNOSIS — N183 Chronic kidney disease, stage 3 (moderate): Secondary | ICD-10-CM | POA: Diagnosis not present

## 2015-10-16 DIAGNOSIS — N2581 Secondary hyperparathyroidism of renal origin: Secondary | ICD-10-CM | POA: Diagnosis not present

## 2015-11-24 ENCOUNTER — Other Ambulatory Visit: Payer: Self-pay | Admitting: Family Medicine

## 2015-11-25 ENCOUNTER — Other Ambulatory Visit: Payer: Self-pay | Admitting: Family Medicine

## 2015-11-27 ENCOUNTER — Other Ambulatory Visit: Payer: Self-pay | Admitting: Family Medicine

## 2015-12-23 ENCOUNTER — Other Ambulatory Visit: Payer: Self-pay | Admitting: Family Medicine

## 2015-12-24 ENCOUNTER — Other Ambulatory Visit: Payer: Self-pay | Admitting: Family Medicine

## 2016-01-08 ENCOUNTER — Encounter (HOSPITAL_COMMUNITY): Payer: Self-pay | Admitting: *Deleted

## 2016-01-08 ENCOUNTER — Telehealth: Payer: Self-pay | Admitting: Family Medicine

## 2016-01-08 ENCOUNTER — Emergency Department (HOSPITAL_COMMUNITY)
Admission: EM | Admit: 2016-01-08 | Discharge: 2016-01-08 | Disposition: A | Discharge status: Home / Self Care | Payer: Medicare Other | Attending: Emergency Medicine | Admitting: Emergency Medicine

## 2016-01-08 ENCOUNTER — Emergency Department (HOSPITAL_COMMUNITY): Payer: Medicare Other

## 2016-01-08 DIAGNOSIS — Z79899 Other long term (current) drug therapy: Secondary | ICD-10-CM | POA: Insufficient documentation

## 2016-01-08 DIAGNOSIS — I2510 Atherosclerotic heart disease of native coronary artery without angina pectoris: Secondary | ICD-10-CM | POA: Diagnosis not present

## 2016-01-08 DIAGNOSIS — E119 Type 2 diabetes mellitus without complications: Secondary | ICD-10-CM | POA: Diagnosis not present

## 2016-01-08 DIAGNOSIS — Y9389 Activity, other specified: Secondary | ICD-10-CM | POA: Diagnosis not present

## 2016-01-08 DIAGNOSIS — I509 Heart failure, unspecified: Secondary | ICD-10-CM | POA: Diagnosis not present

## 2016-01-08 DIAGNOSIS — Y9289 Other specified places as the place of occurrence of the external cause: Secondary | ICD-10-CM | POA: Insufficient documentation

## 2016-01-08 DIAGNOSIS — E860 Dehydration: Secondary | ICD-10-CM | POA: Diagnosis not present

## 2016-01-08 DIAGNOSIS — Z7901 Long term (current) use of anticoagulants: Secondary | ICD-10-CM | POA: Diagnosis not present

## 2016-01-08 DIAGNOSIS — H269 Unspecified cataract: Secondary | ICD-10-CM | POA: Diagnosis not present

## 2016-01-08 DIAGNOSIS — W1839XA Other fall on same level, initial encounter: Secondary | ICD-10-CM | POA: Insufficient documentation

## 2016-01-08 DIAGNOSIS — Y998 Other external cause status: Secondary | ICD-10-CM | POA: Insufficient documentation

## 2016-01-08 DIAGNOSIS — E7800 Pure hypercholesterolemia, unspecified: Secondary | ICD-10-CM | POA: Insufficient documentation

## 2016-01-08 DIAGNOSIS — I129 Hypertensive chronic kidney disease with stage 1 through stage 4 chronic kidney disease, or unspecified chronic kidney disease: Secondary | ICD-10-CM | POA: Diagnosis not present

## 2016-01-08 DIAGNOSIS — S4991XA Unspecified injury of right shoulder and upper arm, initial encounter: Secondary | ICD-10-CM | POA: Insufficient documentation

## 2016-01-08 DIAGNOSIS — K219 Gastro-esophageal reflux disease without esophagitis: Secondary | ICD-10-CM | POA: Diagnosis not present

## 2016-01-08 DIAGNOSIS — N189 Chronic kidney disease, unspecified: Secondary | ICD-10-CM | POA: Diagnosis not present

## 2016-01-08 DIAGNOSIS — Z87891 Personal history of nicotine dependence: Secondary | ICD-10-CM | POA: Insufficient documentation

## 2016-01-08 DIAGNOSIS — Z8546 Personal history of malignant neoplasm of prostate: Secondary | ICD-10-CM | POA: Diagnosis not present

## 2016-01-08 DIAGNOSIS — S6991XA Unspecified injury of right wrist, hand and finger(s), initial encounter: Secondary | ICD-10-CM | POA: Insufficient documentation

## 2016-01-08 DIAGNOSIS — Z7982 Long term (current) use of aspirin: Secondary | ICD-10-CM | POA: Diagnosis not present

## 2016-01-08 DIAGNOSIS — M1990 Unspecified osteoarthritis, unspecified site: Secondary | ICD-10-CM | POA: Insufficient documentation

## 2016-01-08 DIAGNOSIS — M6281 Muscle weakness (generalized): Secondary | ICD-10-CM | POA: Diagnosis not present

## 2016-01-08 DIAGNOSIS — Z951 Presence of aortocoronary bypass graft: Secondary | ICD-10-CM | POA: Diagnosis not present

## 2016-01-08 DIAGNOSIS — R011 Cardiac murmur, unspecified: Secondary | ICD-10-CM | POA: Insufficient documentation

## 2016-01-08 DIAGNOSIS — R42 Dizziness and giddiness: Secondary | ICD-10-CM | POA: Diagnosis present

## 2016-01-08 LAB — URINALYSIS, ROUTINE W REFLEX MICROSCOPIC
Bilirubin Urine: NEGATIVE
Glucose, UA: NEGATIVE mg/dL
HGB URINE DIPSTICK: NEGATIVE
Ketones, ur: NEGATIVE mg/dL
Leukocytes, UA: NEGATIVE
Nitrite: NEGATIVE
Protein, ur: NEGATIVE mg/dL
SPECIFIC GRAVITY, URINE: 1.014 (ref 1.005–1.030)
pH: 5.5 (ref 5.0–8.0)

## 2016-01-08 LAB — BASIC METABOLIC PANEL
Anion gap: 11 (ref 5–15)
BUN: 41 mg/dL — AB (ref 6–20)
CALCIUM: 9.6 mg/dL (ref 8.9–10.3)
CO2: 23 mmol/L (ref 22–32)
CREATININE: 2.65 mg/dL — AB (ref 0.61–1.24)
Chloride: 96 mmol/L — ABNORMAL LOW (ref 101–111)
GFR calc Af Amer: 25 mL/min — ABNORMAL LOW (ref >60)
GFR, EST NON AFRICAN AMERICAN: 21 mL/min — AB (ref >60)
GLUCOSE: 182 mg/dL — AB (ref 65–99)
POTASSIUM: 4.5 mmol/L (ref 3.5–5.1)
SODIUM: 130 mmol/L — AB (ref 135–145)

## 2016-01-08 LAB — CBC
HCT: 43.5 % (ref 39.0–52.0)
HEMOGLOBIN: 15.4 g/dL (ref 13.0–17.0)
MCH: 31.1 pg (ref 26.0–34.0)
MCHC: 35.4 g/dL (ref 30.0–36.0)
MCV: 87.9 fL (ref 78.0–100.0)
Platelets: 123 10*3/uL — ABNORMAL LOW (ref 150–400)
RBC: 4.95 MIL/uL (ref 4.22–5.81)
RDW: 12.9 % (ref 11.5–15.5)
WBC: 9.1 10*3/uL (ref 4.0–10.5)

## 2016-01-08 LAB — CBG MONITORING, ED: GLUCOSE-CAPILLARY: 192 mg/dL — AB (ref 65–99)

## 2016-01-08 MED ORDER — SODIUM CHLORIDE 0.9 % IV BOLUS (SEPSIS)
1000.0000 mL | Freq: Once | INTRAVENOUS | Status: AC
  Administered 2016-01-08: 1000 mL via INTRAVENOUS

## 2016-01-08 MED ORDER — HYDROMORPHONE HCL 1 MG/ML IJ SOLN
0.5000 mg | Freq: Once | INTRAMUSCULAR | Status: AC
Start: 2016-01-08 — End: 2016-01-08
  Administered 2016-01-08: 0.5 mg via INTRAVENOUS
  Filled 2016-01-08: qty 1

## 2016-01-08 MED ORDER — BENZONATATE 100 MG PO CAPS
100.0000 mg | ORAL_CAPSULE | Freq: Once | ORAL | Status: AC
  Administered 2016-01-08: 100 mg via ORAL
  Filled 2016-01-08: qty 1

## 2016-01-08 MED ORDER — SODIUM CHLORIDE 0.9 % IV BOLUS (SEPSIS)
1000.0000 mL | Freq: Once | INTRAVENOUS | Status: AC
Start: 2016-01-08 — End: 2016-01-08
  Administered 2016-01-08: 1000 mL via INTRAVENOUS

## 2016-01-08 MED ORDER — TRAMADOL HCL 50 MG PO TABS
50.0000 mg | ORAL_TABLET | Freq: Once | ORAL | Status: AC
  Administered 2016-01-08: 50 mg via ORAL
  Filled 2016-01-08: qty 1

## 2016-01-08 NOTE — ED Provider Notes (Signed)
CSN: [id number]     Arrival date & time 01/08/16  1457 History   First MD Initiated Contact with Patient 01/08/16 1747     Chief Complaint  Patient presents with  . Fall     (Consider location/radiation/quality/duration/timing/severity/associated sxs/prior Treatment) HPI   82 year old male presenting  After a fall.  Patient reports that he has fallen several times recently. Today he felt dizzy and went to the ground. He is having some right upper extremity pain which has been persistent aftera fall preceding the once a day. He denies any acute pain today though. Family reports that he is seeing more of balance recently. Mild confusion at times. Patient does not agree with this. Denies any recent medication changes. No fevers or chills. No nausea or vomiting. No urinary complaints.  Past Medical History  Diagnosis Date  . CHF (congestive heart failure) ([place])   . Hypertension   . Hernia   . Other and unspecified diseases of appendix   . Pancreatitis   . Prostate CA ([place]) 03/01/04    prostate bx=Adenocarcinoam,gleason 3+4=7,PSA=6.75  . Incontinence     hx  over 1 year,leaks without awareness  . Arthritis   . GERD (gastroesophageal reflux disease)   . Ulcer     hx gastric  . Hypercholesterolemia   . Heart murmur   . Chronic kidney disease     nephrolithiasis  . Cataract     surgery,B/L  . CAD (coronary artery disease)     a. Cath September 2015 LIMA to the LAD patent, SVG to PDA patent, SVG to posterior lateral patent, SVG to OM with a 90% in-stent restenosis at an anastomotic lesion. This was treated with angioplasty. b. cath 04/01/2015 95% ISR in SVG to OM treated with 2.75x24 Synergy DES postdilated to 3.28mm, all other grafts patent  . Shortness of breath dyspnea   . Diabetes mellitus     TYPE 2  . PAF (paroxysmal atrial fibrillation) ([place])     in the setting of ischemia on 03/31/2015, holding off on starting coumadin after received DAPT, will monitor for recurrence with 30 day  event monitor   Past Surgical History  Procedure Laterality Date  . Appendectomy    . Hernia repair    . Carpal tunnel release  2004    both hands  . Laparoscopic cholecystectomy  09/2009  . Incision and drainage of right chest abscess    . Coronary artery bypass graft    . Robot assisted laparoscopic radical prostatectomy  2005  . Back surgery    . Rectal surgery    . Cystoscopy  08/23/12    incomplete emoptying bladder  . Left heart catheterization with coronary angiogram N/A 07/19/2014    Procedure: LEFT HEART CATHETERIZATION WITH CORONARY ANGIOGRAM;  Surgeon: Leonie Man, MD;  Location: [facility] CATH LAB;  Service: Cardiovascular;  Laterality: N/A;  . Cardiac catheterization N/A 04/01/2015    Procedure: Left Heart Cath and Cors/Grafts Angiography;  Surgeon: Jettie Booze, MD;  Location: [place] CV LAB;  Service: Cardiovascular;  Laterality: N/A;  . Cardiac catheterization N/A 04/01/2015    Procedure: Coronary Stent Intervention;  Surgeon: Jettie Booze, MD;  Location: [place] CV LAB;  Service: Cardiovascular;  Laterality: N/A;   Family History  Problem Relation Age of Onset  . Cancer Mother     stomach   Social History  Substance Use Topics  . Smoking status: Former Smoker -- 0.50 packs/day for 5 years    Types: Cigarettes  Quit date: 07/20/1972  . Smokeless tobacco: Never Used     Comment: quit s  . Alcohol Use: No    Review of Systems  All systems reviewed and negative, other than as noted in HPI.   Allergies  Codeine and Morphine  Home Medications   Prior to Admission medications   Medication Sig Start Date End Date Taking? Authorizing Provider  ALPRAZolam (XANAX) 1 MG tablet TAKE 1/2-1 TABLET BY MOUTH UP TO THREE TIMES A DAY AS NEEDED FOR ANXIETY 11/26/15   [name], MD  amLODipine (NORVASC) 10 MG tablet TAKE ONE TABLET BY MOUTH EVERY DAY 12/24/15   [name], MD  aspirin EC 81 MG EC tablet Take 1 tablet (81 mg total) by mouth  daily. 07/20/14   Brett Canales, PA-C  carvedilol (COREG) 12.5 MG tablet TAKE 1 TABLET BY MOUTH TWICE A DAY WITH A MEAL 07/31/15   [name], MD  clopidogrel (PLAVIX) 75 MG tablet Take 1 tablet (75 mg total) by mouth daily. 04/02/15   Almyra Deforest, PA  furosemide (LASIX) 40 MG tablet Take 1 tablet (40 mg total) by mouth daily. 07/30/15   [name], MD  glucose blood (FREESTYLE LITE) test strip 1 each by Other route as needed. Use as instructed     Historical Provider, MD  JANUVIA 50 MG tablet TAKE 1 TABLET BY MOUTH EVERY DAY 07/28/15   [name], MD  Lancets (FREESTYLE) lancets 1 each by Other route as needed. Use as instructed     Historical Provider, MD  NITROSTAT 0.4 MG SL tablet USE AS DIRECTED 04/09/15   [name], MD  pantoprazole (PROTONIX) 40 MG tablet TAKE 1 TABLET BY MOUTH EVERY DAY 11/24/15   [name], MD  rosuvastatin (CRESTOR) 20 MG tablet TAKE 1 TABLET BY MOUTH EVERY DAY 12/23/15   [name], MD  spironolactone (ALDACTONE) 25 MG tablet TAKE 1/2 TABLET BY MOUTH 2 TIMES A DAY 09/02/15   [name], MD  traMADol (ULTRAM) 50 MG tablet Take 1 tablet (50 mg total) by mouth every 6 (six) hours as needed. 1-2 every 6 h prn pain 11/04/11   [name], MD   BP 137/72 mmHg  Pulse 66  Temp(Src) 98.3 F (36.8 C) (Oral)  Resp 20  SpO2 96% Physical Exam  Constitutional: He appears well-developed and well-nourished. No distress.  HENT:  Head: Normocephalic and atraumatic.  Eyes: Conjunctivae are normal. Right eye exhibits no discharge. Left eye exhibits no discharge.  Neck: Neck supple.  Cardiovascular: Normal rate, regular rhythm and normal heart sounds.  Exam reveals no gallop and no friction rub.   No murmur heard. Pulmonary/Chest: Effort normal and breath sounds normal. No respiratory distress.  Abdominal: Soft. He exhibits no distension. There is no tenderness.  Musculoskeletal: He exhibits tenderness. He exhibits no edema.  Mild tenderness  along the right upper extremity and subsequently from his shoulder only down into his wrist/proximal hand. No swelling appreciated. No concerning skin lesions. Is able to actively range her shoulder, elbow and wrist although he reports increased pain. He is neurovascularly intact. No midline spinal tenderness.  Neurological: He is alert.  Skin: Skin is warm and dry.  Psychiatric: He has a normal mood and affect. His behavior is normal. Thought content normal.  Nursing note and vitals reviewed.   ED Course  Procedures (including critical care time) Labs Review Labs Reviewed  BASIC METABOLIC PANEL - Abnormal; Notable for the following:    Sodium  130 (*)    Chloride 96 (*)    Glucose, Bld 182 (*)    BUN 41 (*)    Creatinine, Ser 2.65 (*)    GFR calc non Af Amer 21 (*)    GFR calc Af Amer 25 (*)    All other components within normal limits  CBC - Abnormal; Notable for the following:    Platelets 123 (*)    All other components within normal limits  CBG MONITORING, ED - Abnormal; Notable for the following:    Glucose-Capillary 192 (*)    All other components within normal limits  URINALYSIS, ROUTINE W REFLEX MICROSCOPIC (NOT AT [facility])    Imaging Review No results found. I have personally reviewed and evaluated these images and lab results as part of my medical decision-making.   EKG Interpretation   Date/Time:  Thursday January 08 2016 16:04:36 EDT Ventricular Rate:  67 PR Interval:    QRS Duration: 148 QT Interval:  406 QTC Calculation: 429 R Axis:   78 Text Interpretation:  Sinus rhythm Left bundle branch block Abnormal ECG  Confirmed by Wilson Singer  MD, Kinzie Wickes ([id number]) on 01/08/2016 6:33:20 PM      MDM   Final diagnoses:  Dehydration    Pleasant 82 year old male with generalized weakness and recent falls. Suspect secondary to some dehydration. Labs were consistent with this. He says that he feels significantly better after receiving some IV fluids. He would like to go home. I  feel that this is reasonable. He is afebrile and has a nonfocal neuro exam.I generally have a low suspicion for emergent process. It has been determined that no acute conditions requiring further emergency intervention are present at this time. The patient has been advised of the diagnosis and plan. I reviewed any labs and imaging including any potential incidental findings. We have discussed signs and symptoms that warrant return to the ED and they are listed in the discharge instructions.      Virgel Manifold, MD 01/13/16 1153

## 2016-01-08 NOTE — Discharge Instructions (Signed)
I want you to hold your Lasix (furosemide) and spironolactone (Aldactone) for the next 2 days and then r Dehydration, Adult Dehydration is a condition in which you do not have enough fluid or water in your body. It happens when you take in less fluid than you lose. Vital organs such as the kidneys, brain, and heart cannot function without a proper amount of fluids. Any loss of fluids from the body can cause dehydration.  Dehydration can range from mild to severe. This condition should be treated right away to help prevent it from becoming severe. CAUSES  This condition may be caused by:  Vomiting.  Diarrhea.  Excessive sweating, such as when exercising in hot or humid weather.  Not drinking enough fluid during strenuous exercise or during an illness.  Excessive urine output.  Fever.  Certain medicines. RISK FACTORS This condition is more likely to develop in:  People who are taking certain medicines that cause the body to lose excess fluid (diuretics).   People who have a chronic illness, such as diabetes, that may increase urination.  Older adults.   People who live at high altitudes.   People who participate in endurance sports.  SYMPTOMS  Mild Dehydration  Thirst.  Dry lips.  Slightly dry mouth.  Dry, warm skin. Moderate Dehydration  Very dry mouth.   Muscle cramps.   Dark urine and decreased urine production.   Decreased tear production.   Headache.   Light-headedness, especially when you stand up from a sitting position.  Severe Dehydration  Changes in skin.   Cold and clammy skin.   Skin does not spring back quickly when lightly pinched and released.   Changes in body fluids.   Extreme thirst.   No tears.   Not able to sweat when body temperature is high, such as in hot weather.   Minimal urine production.   Changes in vital signs.   Rapid, weak pulse (more than 100 beats per minute when you are sitting still).    Rapid breathing.   Low blood pressure.   Other changes.   Sunken eyes.   Cold hands and feet.   Confusion.  Lethargy and difficulty being awakened.  Fainting (syncope).   Short-term weight loss.   Unconsciousness. DIAGNOSIS  This condition may be diagnosed based on your symptoms. You may also have tests to determine how severe your dehydration is. These tests may include:   Urine tests.   Blood tests.  TREATMENT  Treatment for this condition depends on the severity. Mild or moderate dehydration can often be treated at home. Treatment should be started right away. Do not wait until dehydration becomes severe. Severe dehydration needs to be treated at the hospital. Treatment for Mild Dehydration  Drinking plenty of water to replace the fluid you have lost.   Replacing minerals in your blood (electrolytes) that you may have lost.  Treatment for Moderate Dehydration  Consuming oral rehydration solution (ORS). Treatment for Severe Dehydration  Receiving fluid through an IV tube.   Receiving electrolyte solution through a feeding tube that is passed through your nose and into your stomach (nasogastric tube or NG tube).  Correcting any abnormalities in electrolytes. HOME CARE INSTRUCTIONS   Drink enough fluid to keep your urine clear or pale yellow.   Drink water or fluid slowly by taking small sips. You can also try sucking on ice cubes.  Have food or beverages that contain electrolytes. Examples include bananas and sports drinks.  Take over-the-counter and prescription medicines  only as told by your health care provider.   Prepare ORS according to the manufacturer's instructions. Take sips of ORS every 5 minutes until your urine returns to normal.  If you have vomiting or diarrhea, continue to try to drink water, ORS, or both.   If you have diarrhea, avoid:   Beverages that contain caffeine.   Fruit juice.   Milk.   Carbonated soft  drinks.  Do not take salt tablets. This can lead to the condition of having too much sodium in your body (hypernatremia).  SEEK MEDICAL CARE IF:  You cannot eat or drink without vomiting.  You have had moderate diarrhea during a period of more than 24 hours.  You have a fever. SEEK IMMEDIATE MEDICAL CARE IF:   You have extreme thirst.  You have severe diarrhea.  You have not urinated in 6-8 hours, or you have urinated only a small amount of very dark urine.  You have shriveled skin.  You are dizzy, confused, or both.   This information is not intended to replace advice given to you by your health care provider. Make sure you discuss any questions you have with your health care provider.   Document Released: 10/04/2005 Document Revised: 06/25/2015 Document Reviewed: 02/19/2015 Elsevier Interactive Patient Education 2016 [place] prior dosing and follow-up with PCP. Try to drink more water and less juice.

## 2016-01-08 NOTE — Telephone Encounter (Signed)
[name] would like you to know that pt has been congested, coughing nad had a bad night last night.  Was walking the floors,confused and talking non sensibly. Wife has called her son who is on the way to help  take him to the ED.  They feel pt is also possibly dehydrated. They will go to [facility].

## 2016-01-08 NOTE — ED Notes (Signed)
Patient transported to X-ray 

## 2016-01-08 NOTE — Telephone Encounter (Signed)
FYI

## 2016-01-08 NOTE — ED Notes (Signed)
Pt reports cough, congestion, dizziness for several weeks. Pt reports generalized weakness with several falls recently. Pt reports pain to rt forearm and wrist. Pt alert and oriented at triage

## 2016-01-12 ENCOUNTER — Encounter: Payer: Self-pay | Admitting: Family Medicine

## 2016-01-12 ENCOUNTER — Ambulatory Visit (INDEPENDENT_AMBULATORY_CARE_PROVIDER_SITE_OTHER): Payer: Medicare Other | Admitting: Family Medicine

## 2016-01-12 DIAGNOSIS — R531 Weakness: Secondary | ICD-10-CM

## 2016-01-12 DIAGNOSIS — E1121 Type 2 diabetes mellitus with diabetic nephropathy: Secondary | ICD-10-CM

## 2016-01-12 DIAGNOSIS — R059 Cough, unspecified: Secondary | ICD-10-CM

## 2016-01-12 DIAGNOSIS — R062 Wheezing: Secondary | ICD-10-CM | POA: Diagnosis not present

## 2016-01-12 DIAGNOSIS — R05 Cough: Secondary | ICD-10-CM

## 2016-01-12 DIAGNOSIS — N189 Chronic kidney disease, unspecified: Secondary | ICD-10-CM | POA: Diagnosis not present

## 2016-01-12 DIAGNOSIS — N184 Chronic kidney disease, stage 4 (severe): Secondary | ICD-10-CM

## 2016-01-12 DIAGNOSIS — N179 Acute kidney failure, unspecified: Secondary | ICD-10-CM | POA: Diagnosis not present

## 2016-01-12 MED ORDER — ALBUTEROL SULFATE 108 (90 BASE) MCG/ACT IN AEPB: 2.0000 | INHALATION_SPRAY | RESPIRATORY_TRACT | Status: DC

## 2016-01-12 MED ORDER — BENZONATATE 200 MG PO CAPS: 200.0000 mg | ORAL_CAPSULE | Freq: Three times a day (TID) | ORAL | Status: DC | PRN

## 2016-01-12 NOTE — Progress Notes (Signed)
Pre visit review using our clinic review tool, if applicable. No additional management support is needed unless otherwise documented below in the visit note. 

## 2016-01-12 NOTE — Progress Notes (Signed)
Subjective:    Patient ID: [name], male    DOB: 1933/07/13, 82 y.o.   MRN: [id number]  HPI  patient has multiple chronic problems including history of CAD, type 2 diabetes, rosacea, atrial fibrillation, ischemic cardiomyopathy, history of prostate cancer, dyslipidemia, chronic kidney disease. He is seen for ER follow-up. On Wednesday last week he developed increased fatigue symptoms following a "cold ".  Around 4 AM on the 23rd  Got up to go to the bathroom and felt very weak and basically couldn't bear any weight. He was unable to get back to the bed or the bathroom. At that point he was taken into the hospital for further evaluation. He denied any chest pain. He had significant cough but no fevers or chills. No urinary symptoms.   chest x-ray unremarkable. CBC normal with exception of slightly low platelets which are chronic. Urinalysis no acute finding. Electrolytes significant for sodium 130 with creatinine 2.65 which is up from his baseline around 1.9. Received 2 bags of IV fluids and felt somewhat better afterwards. He was instructed to hold his furosemide and Aldactone for 2 days.   Still has significant cough which is mostly nonproductive. Still no fevers or chills. No hemoptysis.  Past Medical History  Diagnosis Date  . CHF (congestive heart failure) ([place])   . Hypertension   . Hernia   . Other and unspecified diseases of appendix   . Pancreatitis   . Prostate CA ([place]) 03/01/04    prostate bx=Adenocarcinoam,gleason 3+4=7,PSA=6.75  . Incontinence     hx  over 1 year,leaks without awareness  . Arthritis   . GERD (gastroesophageal reflux disease)   . Ulcer     hx gastric  . Hypercholesterolemia   . Heart murmur   . Chronic kidney disease     nephrolithiasis  . Cataract     surgery,B/L  . CAD (coronary artery disease)     a. Cath September 2015 LIMA to the LAD patent, SVG to PDA patent, SVG to posterior lateral patent, SVG to OM with a 90% in-stent restenosis at an  anastomotic lesion. This was treated with angioplasty. b. cath 04/01/2015 95% ISR in SVG to OM treated with 2.75x24 Synergy DES postdilated to 3.11mm, all other grafts patent  . Shortness of breath dyspnea   . Diabetes mellitus     TYPE 2  . PAF (paroxysmal atrial fibrillation) ([place])     in the setting of ischemia on 03/31/2015, holding off on starting coumadin after received DAPT, will monitor for recurrence with 30 day event monitor   Past Surgical History  Procedure Laterality Date  . Appendectomy    . Hernia repair    . Carpal tunnel release  2004    both hands  . Laparoscopic cholecystectomy  09/2009  . Incision and drainage of right chest abscess    . Coronary artery bypass graft    . Robot assisted laparoscopic radical prostatectomy  2005  . Back surgery    . Rectal surgery    . Cystoscopy  08/23/12    incomplete emoptying bladder  . Left heart catheterization with coronary angiogram N/A 07/19/2014    Procedure: LEFT HEART CATHETERIZATION WITH CORONARY ANGIOGRAM;  Surgeon: Leonie Man, MD;  Location: [facility] CATH LAB;  Service: Cardiovascular;  Laterality: N/A;  . Cardiac catheterization N/A 04/01/2015    Procedure: Left Heart Cath and Cors/Grafts Angiography;  Surgeon: Jettie Booze, MD;  Location: [place] [facility] CV LAB;  Service: Cardiovascular;  Laterality: N/A;  .  Cardiac catheterization N/A 04/01/2015    Procedure: Coronary Stent Intervention;  Surgeon: Jettie Booze, MD;  Location: [place] CV LAB;  Service: Cardiovascular;  Laterality: N/A;    reports that he quit smoking about 43 years ago. His smoking use included Cigarettes. He has a 2.5 pack-year smoking history. He has never used smokeless tobacco. He reports that he does not drink alcohol or use illicit drugs. family history includes Cancer in his mother. Allergies  Allergen Reactions  . Codeine   . Morphine     REACTION: does not like      Review of Systems  Constitutional: Positive for fatigue.  Negative for fever and chills.  HENT: Negative for sore throat.   Respiratory: Positive for cough, shortness of breath and wheezing.   Cardiovascular: Negative for chest pain, palpitations and leg swelling.  Gastrointestinal: Negative for abdominal pain.  Genitourinary: Negative for dysuria.  Neurological: Negative for dizziness.  Psychiatric/Behavioral: Negative for confusion.       Objective:   Physical Exam  Constitutional: He appears well-developed and well-nourished. No distress.  HENT:  Mouth/Throat: Oropharynx is clear and moist.  Neck: Neck supple.  Cardiovascular: Normal rate and regular rhythm.   Pulmonary/Chest: Effort normal.  He has some moderate wheezing in both bases. No respiratory distress. Normal respiratory rate  Musculoskeletal: He exhibits no edema.  Lymphadenopathy:    He has no cervical adenopathy.  Neurological: He is alert.          Assessment & Plan:   #1 recent weakness. Probably related to recent viral syndrome. Somewhat improved following IV fluids   #2 recent acute on chronic kidney failure with bump in creatinine from baseline around 1.9 to 2.65. Suspect related to dehydration. Recheck basic metabolic panel   #3 wheezing. Probably related to recent viral URI. Recent chest x-ray no pneumonia. No fever.  No respiratory distress. Nebulizer given with duoneb. Patient much clearer afterwards and symptomatically improved with less wheezing. May need steroids but try to avoid with prior history of hyperglycemia.  Pro-air respiclick 2 puffs every 4 hours as needed.   #4 type 2 diabetes. Overdue for A1c. We'll go ahead and obtain today. Schedule follow-up to reassess in 2 weeks

## 2016-01-12 NOTE — Patient Instructions (Signed)
Follow up immediately for any fever or increased shortness of breath. 

## 2016-01-13 LAB — BASIC METABOLIC PANEL
BUN: 36 mg/dL — AB (ref 6–23)
CHLORIDE: 97 meq/L (ref 96–112)
CO2: 23 mEq/L (ref 19–32)
Calcium: 9.5 mg/dL (ref 8.4–10.5)
Creatinine, Ser: 2.38 mg/dL — ABNORMAL HIGH (ref 0.40–1.50)
GFR: 28.03 mL/min — ABNORMAL LOW (ref >60.00)
GLUCOSE: 170 mg/dL — AB (ref 70–99)
POTASSIUM: 4.7 meq/L (ref 3.5–5.1)
Sodium: 131 mEq/L — ABNORMAL LOW (ref 135–145)

## 2016-01-13 LAB — HEMOGLOBIN A1C: Hgb A1c MFr Bld: 7.2 % — ABNORMAL HIGH (ref 4.6–6.5)

## 2016-01-14 ENCOUNTER — Other Ambulatory Visit: Payer: Self-pay | Admitting: Family Medicine

## 2016-01-14 MED ORDER — FUROSEMIDE 40 MG PO TABS: ORAL_TABLET | ORAL | Status: DC

## 2016-01-18 ENCOUNTER — Other Ambulatory Visit: Payer: Self-pay | Admitting: Family Medicine

## 2016-01-20 MED ORDER — IPRATROPIUM-ALBUTEROL 0.5-2.5 (3) MG/3ML IN SOLN
3.0000 mL | Freq: Once | RESPIRATORY_TRACT | Status: AC
  Administered 2016-01-12: 3 mL via RESPIRATORY_TRACT

## 2016-01-20 MED ORDER — IPRATROPIUM-ALBUTEROL 0.5-2.5 (3) MG/3ML IN SOLN: 3.0000 mL | RESPIRATORY_TRACT | Status: AC

## 2016-01-20 MED ORDER — IPRATROPIUM-ALBUTEROL 0.5-2.5 (3) MG/3ML IN SOLN
3.0000 mL | Freq: Four times a day (QID) | RESPIRATORY_TRACT | Status: DC
  Administered 2016-01-12 (×2): 3 mL via RESPIRATORY_TRACT

## 2016-01-20 NOTE — Addendum Note (Signed)
Addended by: Elio Forget on: 01/20/2016 08:55 AM   Modules accepted: Orders

## 2016-01-20 NOTE — Addendum Note (Signed)
Addended by: Elio Forget on: 01/20/2016 05:31 PM   Modules accepted: Orders

## 2016-01-22 ENCOUNTER — Other Ambulatory Visit: Payer: Self-pay | Admitting: *Deleted

## 2016-01-22 MED ORDER — CARVEDILOL 12.5 MG PO TABS: ORAL_TABLET | ORAL | Status: DC

## 2016-01-26 ENCOUNTER — Ambulatory Visit (INDEPENDENT_AMBULATORY_CARE_PROVIDER_SITE_OTHER): Payer: Medicare Other | Admitting: Family Medicine

## 2016-01-26 ENCOUNTER — Encounter: Payer: Self-pay | Admitting: Family Medicine

## 2016-01-26 DIAGNOSIS — I10 Essential (primary) hypertension: Secondary | ICD-10-CM | POA: Diagnosis not present

## 2016-01-26 DIAGNOSIS — L570 Actinic keratosis: Secondary | ICD-10-CM | POA: Diagnosis not present

## 2016-01-26 DIAGNOSIS — E1121 Type 2 diabetes mellitus with diabetic nephropathy: Secondary | ICD-10-CM | POA: Diagnosis not present

## 2016-01-26 DIAGNOSIS — N184 Chronic kidney disease, stage 4 (severe): Secondary | ICD-10-CM | POA: Diagnosis not present

## 2016-01-26 LAB — BASIC METABOLIC PANEL
BUN: 27 mg/dL — ABNORMAL HIGH (ref 6–23)
CALCIUM: 10 mg/dL (ref 8.4–10.5)
CO2: 22 meq/L (ref 19–32)
CREATININE: 1.93 mg/dL — AB (ref 0.40–1.50)
Chloride: 102 mEq/L (ref 96–112)
GFR: 35.7 mL/min — ABNORMAL LOW (ref >60.00)
GLUCOSE: 136 mg/dL — AB (ref 70–99)
Potassium: 4.5 mEq/L (ref 3.5–5.1)
Sodium: 135 mEq/L (ref 135–145)

## 2016-01-26 NOTE — Progress Notes (Signed)
Subjective:    Patient ID: [name], male    DOB: Aug 04, 1933, 82 y.o.   MRN: [id number]  HPI  seen for several issues as follows   Chronic kidney disease. Baseline creatinine around 1.9. Recent creatinine 2.65. He came back for repeat, weeks ago with creatinine slightly improved 2.38. We reduced his furosemide further to 40 mg one half tablet daily. Also takes Aldactone 1 daily. He states he is hydrating well. He has past history of prostate cancer and prostatectomy. He has occasional urine incontinence but no obstructive urinary symptoms. No recent vomiting or diarrhea.   Type 2 diabetes. Generally well-controlled. Last A1c 7.2% which is slightly increased. Does not monitor sugars regularly at home. Takes low-dose Januvia.   He has remote history of squamous cell skin cancer. He has multiple areas of actinic keratosis on the face. No recent ulcerative changes.   Recent respiratory symptoms improved. He has minimal cough at this point and no dyspnea above his usual baseline. No fevers or chills.  Past Medical History  Diagnosis Date  . CHF (congestive heart failure) ([place])   . Hypertension   . Hernia   . Other and unspecified diseases of appendix   . Pancreatitis   . Prostate CA ([place]) 03/01/04    prostate bx=Adenocarcinoam,gleason 3+4=7,PSA=6.75  . Incontinence     hx  over 1 year,leaks without awareness  . Arthritis   . GERD (gastroesophageal reflux disease)   . Ulcer     hx gastric  . Hypercholesterolemia   . Heart murmur   . Chronic kidney disease     nephrolithiasis  . Cataract     surgery,B/L  . CAD (coronary artery disease)     a. Cath September 2015 LIMA to the LAD patent, SVG to PDA patent, SVG to posterior lateral patent, SVG to OM with a 90% in-stent restenosis at an anastomotic lesion. This was treated with angioplasty. b. cath 04/01/2015 95% ISR in SVG to OM treated with 2.75x24 Synergy DES postdilated to 3.14mm, all other grafts patent  . Shortness of breath  dyspnea   . Diabetes mellitus     TYPE 2  . PAF (paroxysmal atrial fibrillation) ([place])     in the setting of ischemia on 03/31/2015, holding off on starting coumadin after received DAPT, will monitor for recurrence with 30 day event monitor   Past Surgical History  Procedure Laterality Date  . Appendectomy    . Hernia repair    . Carpal tunnel release  2004    both hands  . Laparoscopic cholecystectomy  09/2009  . Incision and drainage of right chest abscess    . Coronary artery bypass graft    . Robot assisted laparoscopic radical prostatectomy  2005  . Back surgery    . Rectal surgery    . Cystoscopy  08/23/12    incomplete emoptying bladder  . Left heart catheterization with coronary angiogram N/A 07/19/2014    Procedure: LEFT HEART CATHETERIZATION WITH CORONARY ANGIOGRAM;  Surgeon: Leonie Man, MD;  Location: [facility] CATH LAB;  Service: Cardiovascular;  Laterality: N/A;  . Cardiac catheterization N/A 04/01/2015    Procedure: Left Heart Cath and Cors/Grafts Angiography;  Surgeon: Jettie Booze, MD;  Location: [place] CV LAB;  Service: Cardiovascular;  Laterality: N/A;  . Cardiac catheterization N/A 04/01/2015    Procedure: Coronary Stent Intervention;  Surgeon: Jettie Booze, MD;  Location: [place] CV LAB;  Service: Cardiovascular;  Laterality: N/A;    reports that he  quit smoking about 43 years ago. His smoking use included Cigarettes. He has a 2.5 pack-year smoking history. He has never used smokeless tobacco. He reports that he does not drink alcohol or use illicit drugs. family history includes Cancer in his mother. Allergies  Allergen Reactions  . Codeine   . Morphine     REACTION: does not like      Review of Systems  Constitutional: Negative for fatigue.  Eyes: Negative for visual disturbance.  Respiratory: Negative for cough and chest tightness.   Cardiovascular: Negative for chest pain, palpitations and leg swelling.  Endocrine: Negative for  polydipsia and polyuria.  Neurological: Negative for dizziness, syncope, weakness, light-headedness and headaches.       Objective:   Physical Exam  Constitutional: He is oriented to person, place, and time. He appears well-developed and well-nourished. No distress.  Neck: Neck supple. No JVD present.  Cardiovascular: Normal rate and regular rhythm.   Pulmonary/Chest: Effort normal and breath sounds normal. No respiratory distress. He has no wheezes. He has no rales.  Musculoskeletal: He exhibits no edema.  Neurological: He is alert and oriented to person, place, and time.          Assessment & Plan:   #1 type 2 diabetes. History of fair control. Most recent A1c 7.2%. Reassess in 3 months   #2 chronic kidney disease. Recent acute on chronic kidney failure. Avoid all non-steroidals. He does not take ACE inhibitor or ARB. We reduced his Lasix as above. Repeat check basic metabolic panel today   #3 hypertension stable and at goal   #4 actinic keratoses. He has multiple on the face and also one on dorsal aspect of both ears. We discussed risk and benefits of treatment with liquid nitrogen patient consented. Retreated total of four right face and 3 on the left side of face. Patient tolerated well

## 2016-01-26 NOTE — Progress Notes (Signed)
Pre visit review using our clinic review tool, if applicable. No additional management support is needed unless otherwise documented below in the visit note. 

## 2016-01-26 NOTE — Patient Instructions (Signed)
Actinic Keratosis Actinic keratosis is a precancerous growth on the skin. This means it could develop into skin cancer if it is not treated. About 1% of actinic keratoses turn into skin cancer within a year. It is important to have all such growths removed to prevent them from developing into skin cancer. CAUSES  Actinic keratosis is caused by getting too much ultraviolet (UV) radiation from the sun or other UV light sources. RISK FACTORS Factors that increase your chances of getting actinic keratosis include:  Having light-colored skin and blue eyes.  Having blonde or red hair.  Spending a lot of time in the sun.  Age. The risk of actinic keratosis increases with age. SYMPTOMS  Actinic keratosis growths look like scaly, rough spots of skin. They can be as small as a pinhead or as big as a quarter. They may itch, hurt, or feel sensitive. Sometimes there is a little tag of pink or gray skin growing off them. In some cases, actinic keratoses are easier felt than seen. They do not go away with the use of moisturizing lotions or creams. Actinic keratoses appear most often on areas of skin that get a lot of sun exposure. These areas include the:  Scalp.  Face.  Ears.  Lips.  Upper back.  Backs of the hands.  Forearms. DIAGNOSIS  Your health care provider can usually tell what is wrong by performing a physical exam. A tissue sample (biopsy) may also be taken and examined under a microscope. TREATMENT  Actinic keratosis can be treated several ways. Most treatments can be done in your health care provider's office. Treatment options may include:  Curettage. A tool is used to gently scrape off the growth.  Cryosurgery. Liquid nitrogen is applied to the growth to freeze it. The growth eventually falls off the skin.  Medicated creams, such as 5-fluorouracil or imiquimod. The medicine destroys the cells in the growth.  Chemical peels. Chemicals are applied to the growth and the outer  layers of skin are peeled off.  Photodynamic therapy. A drug that makes your skin more sensitive to light is applied to the skin. A strong, blue light is aimed at the skin and destroys the growth. PREVENTION  To prevent future sun damage:  Try to avoid the sun between 10:00 a.m. and 4:00 p.m. when it is the strongest.  Use a sunscreen or sunblock with SPF 30 or greater.  Apply sunscreen at least 30 minutes before exposure to the sun.  Always wear protective hats, clothing, and sunglasses with UV protection.  Avoid medicines, herbs, and foods that increase your sensitivity to sunlight.  Avoid tanning beds. HOME CARE INSTRUCTIONS   If your skin was covered with a bandage, change and remove the bandage as directed by your health care provider.  Keep the treated area dry as directed by your health care provider.  Apply any creams as prescribed by your health care provider. Follow the directions carefully.  Check your skin regularly for any changes.  Visit a skin doctor (dermatologist) every year for a skin exam. SEEK MEDICAL CARE IF:   Your skin does not heal and becomes irritated, red, or bleeds.  You notice any changes or new growths on your skin.   This information is not intended to replace advice given to you by your health care provider. Make sure you discuss any questions you have with your health care provider.   Document Released: 12/31/2008 Document Revised: 10/25/2014 Document Reviewed: 11/15/2011 Elsevier Interactive Patient Education 2016 Elsevier   Inc.  

## 2016-01-29 ENCOUNTER — Other Ambulatory Visit: Payer: Self-pay | Admitting: Family Medicine

## 2016-01-29 ENCOUNTER — Other Ambulatory Visit: Payer: Self-pay | Admitting: *Deleted

## 2016-01-29 MED ORDER — SPIRONOLACTONE 25 MG PO TABS: 12.5000 mg | ORAL_TABLET | Freq: Two times a day (BID) | ORAL | Status: DC

## 2016-01-29 MED ORDER — PANTOPRAZOLE SODIUM 40 MG PO TBEC
40.0000 mg | DELAYED_RELEASE_TABLET | Freq: Every day | ORAL | Status: DC
Start: 2016-01-29 — End: 2016-06-12

## 2016-01-29 MED ORDER — ROSUVASTATIN CALCIUM 20 MG PO TABS: 20.0000 mg | ORAL_TABLET | Freq: Every day | ORAL | Status: DC

## 2016-01-29 MED ORDER — CARVEDILOL 12.5 MG PO TABS: ORAL_TABLET | ORAL | Status: DC

## 2016-01-29 MED ORDER — AMLODIPINE BESYLATE 10 MG PO TABS
10.0000 mg | ORAL_TABLET | Freq: Every day | ORAL | Status: DC
Start: 2016-01-29 — End: 2017-04-05

## 2016-02-10 MED ORDER — IPRATROPIUM-ALBUTEROL 0.5-2.5 (3) MG/3ML IN SOLN
3.0000 mL | Freq: Once | RESPIRATORY_TRACT | Status: AC
  Administered 2016-01-26: 3 mL via RESPIRATORY_TRACT

## 2016-02-10 NOTE — Addendum Note (Signed)
Addended by: Elio Forget on: 02/10/2016 02:57 PM   Modules accepted: Orders

## 2016-03-09 ENCOUNTER — Other Ambulatory Visit: Payer: Self-pay | Admitting: Cardiology

## 2016-03-09 DIAGNOSIS — I6523 Occlusion and stenosis of bilateral carotid arteries: Secondary | ICD-10-CM

## 2016-03-10 ENCOUNTER — Other Ambulatory Visit: Payer: Self-pay | Admitting: Family Medicine

## 2016-03-16 ENCOUNTER — Telehealth: Payer: Self-pay | Admitting: Family Medicine

## 2016-03-16 NOTE — Telephone Encounter (Signed)
Pt would like to see if you could give pt a different Rx for rosuvastatin 20 mg due pt not being able to get it at the pharmacy.   Pharm:  [facility] [address]

## 2016-03-17 ENCOUNTER — Other Ambulatory Visit: Payer: Self-pay | Admitting: Family Medicine

## 2016-03-17 DIAGNOSIS — E7800 Pure hypercholesterolemia, unspecified: Secondary | ICD-10-CM

## 2016-03-17 DIAGNOSIS — I25700 Atherosclerosis of coronary artery bypass graft(s), unspecified, with unstable angina pectoris: Secondary | ICD-10-CM

## 2016-03-17 NOTE — Telephone Encounter (Signed)
This is a generic for Crestor and I have not heard of any problems with shortage. Has he tried another pharmacy? Crestor has benefit of fewer drug interactions than some other statins and would prefer him stay on this at possible. He is overdue for lipid follow-up and we need to go ahead and schedule lipid and hepatic panel

## 2016-03-17 NOTE — Telephone Encounter (Signed)
Wife is unsure if pharmacy does not have that specific medication or if he needs a PA. She will call back and find out specific details.

## 2016-03-17 NOTE — Telephone Encounter (Signed)
Pt recently seen on 01/26/2016  Last lipid check was 06/03/2014 Started on generic crestor on 01/29/2016 Please advise

## 2016-03-17 NOTE — Telephone Encounter (Signed)
Wife wanted you to know that the pharmacy has the Rx and everything is okay.  Thank you

## 2016-03-30 ENCOUNTER — Ambulatory Visit (HOSPITAL_COMMUNITY)
Admission: RE | Admit: 2016-03-30 | Discharge: 2016-03-30 | Disposition: A | Discharge status: Home / Self Care | Payer: Medicare Other | Source: Ambulatory Visit | Attending: Cardiology | Admitting: Cardiology

## 2016-03-30 DIAGNOSIS — I509 Heart failure, unspecified: Secondary | ICD-10-CM | POA: Insufficient documentation

## 2016-03-30 DIAGNOSIS — N189 Chronic kidney disease, unspecified: Secondary | ICD-10-CM | POA: Diagnosis not present

## 2016-03-30 DIAGNOSIS — I2510 Atherosclerotic heart disease of native coronary artery without angina pectoris: Secondary | ICD-10-CM | POA: Insufficient documentation

## 2016-03-30 DIAGNOSIS — E1122 Type 2 diabetes mellitus with diabetic chronic kidney disease: Secondary | ICD-10-CM | POA: Insufficient documentation

## 2016-03-30 DIAGNOSIS — K219 Gastro-esophageal reflux disease without esophagitis: Secondary | ICD-10-CM | POA: Diagnosis not present

## 2016-03-30 DIAGNOSIS — I130 Hypertensive heart and chronic kidney disease with heart failure and stage 1 through stage 4 chronic kidney disease, or unspecified chronic kidney disease: Secondary | ICD-10-CM | POA: Insufficient documentation

## 2016-03-30 DIAGNOSIS — I6523 Occlusion and stenosis of bilateral carotid arteries: Secondary | ICD-10-CM

## 2016-04-02 ENCOUNTER — Telehealth: Payer: Self-pay | Admitting: *Deleted

## 2016-04-02 DIAGNOSIS — I6523 Occlusion and stenosis of bilateral carotid arteries: Secondary | ICD-10-CM

## 2016-04-02 NOTE — Telephone Encounter (Signed)
t aware of carotid doppler, order placed to get done in 1 year

## 2016-04-02 NOTE — Telephone Encounter (Signed)
-----   Message from [name], MD sent at 04/01/2016 11:47 AM EDT ----- Moderate bilateral stenosis.  Follow up in one year.  Please send result to the patient.  A copy of this result note will be sent to [name], MD

## 2016-04-28 ENCOUNTER — Encounter: Payer: Self-pay | Admitting: Family Medicine

## 2016-04-28 ENCOUNTER — Ambulatory Visit (INDEPENDENT_AMBULATORY_CARE_PROVIDER_SITE_OTHER): Payer: Medicare Other | Admitting: Family Medicine

## 2016-04-28 VITALS — BP 110/60 | HR 62 | Temp 97.9°F | Ht 69.0 in | Wt 188.0 lb

## 2016-04-28 DIAGNOSIS — E785 Hyperlipidemia, unspecified: Secondary | ICD-10-CM | POA: Diagnosis not present

## 2016-04-28 DIAGNOSIS — E1121 Type 2 diabetes mellitus with diabetic nephropathy: Secondary | ICD-10-CM

## 2016-04-28 DIAGNOSIS — N184 Chronic kidney disease, stage 4 (severe): Secondary | ICD-10-CM

## 2016-04-28 DIAGNOSIS — I10 Essential (primary) hypertension: Secondary | ICD-10-CM

## 2016-04-28 DIAGNOSIS — R3915 Urgency of urination: Secondary | ICD-10-CM

## 2016-04-28 LAB — BASIC METABOLIC PANEL
BUN: 23 mg/dL (ref 6–23)
CALCIUM: 10 mg/dL (ref 8.4–10.5)
CO2: 26 mEq/L (ref 19–32)
Chloride: 104 mEq/L (ref 96–112)
Creatinine, Ser: 1.9 mg/dL — ABNORMAL HIGH (ref 0.40–1.50)
GFR: 36.33 mL/min — AB (ref >60.00)
Glucose, Bld: 132 mg/dL — ABNORMAL HIGH (ref 70–99)
POTASSIUM: 3.8 meq/L (ref 3.5–5.1)
SODIUM: 138 meq/L (ref 135–145)

## 2016-04-28 LAB — HEPATIC FUNCTION PANEL
ALBUMIN: 4.5 g/dL (ref 3.5–5.2)
ALK PHOS: 81 U/L (ref 39–117)
ALT: 19 U/L (ref 0–53)
AST: 12 U/L (ref 0–37)
Bilirubin, Direct: 0.2 mg/dL (ref 0.0–0.3)
Total Bilirubin: 1.1 mg/dL (ref 0.2–1.2)
Total Protein: 8.1 g/dL (ref 6.0–8.3)

## 2016-04-28 LAB — LIPID PANEL
CHOLESTEROL: 119 mg/dL (ref 0–200)
HDL: 32.8 mg/dL — AB (ref >39.00)
LDL Cholesterol: 56 mg/dL (ref 0–99)
NONHDL: 86.28
Total CHOL/HDL Ratio: 4
Triglycerides: 150 mg/dL — ABNORMAL HIGH (ref 0.0–149.0)
VLDL: 30 mg/dL (ref 0.0–40.0)

## 2016-04-28 LAB — HEMOGLOBIN A1C: HEMOGLOBIN A1C: 6.4 % (ref 4.6–6.5)

## 2016-04-28 MED ORDER — MIRABEGRON ER 25 MG PO TB24: 25.0000 mg | ORAL_TABLET | Freq: Every day | ORAL | Status: DC

## 2016-04-28 NOTE — Progress Notes (Signed)
Pre visit review using our clinic review tool, if applicable. No additional management support is needed unless otherwise documented below in the visit note. 

## 2016-04-28 NOTE — Progress Notes (Signed)
Subjective:    Patient ID: [name], male    DOB: [birth year], 82 y.o.   MRN: [id number]  HPI Medical follow-up  Type 2 diabetes. Last A1c 7.2%. Not monitoring blood sugars regularly. No polydipsia. No appetite or weight changes. No hypoglycemia. Gets regular eye exams.  Hyperlipidemia. History of CAD. Patient is overdue for lipids.  Hypertension treated with multidrug regimen. Compliant with medications. Blood pressures have been stable. No recent dizziness. No recent chest pains.  Chronic kidney disease. Recent creatinine 2.3.  Urine urgency. No burning with urination. He is starting to get up more at night frequently up to 3 times per night. He has urgency when traveling in the car. Symptoms are becoming more disruptive. No loss of control. No obstructive urinary symptoms.  Past Medical History  Diagnosis Date  . CHF (congestive heart failure) ([place])   . Hypertension   . Hernia   . Other and unspecified diseases of appendix   . Pancreatitis   . Prostate CA ([place]) 03/01/04    prostate bx=Adenocarcinoam,gleason 3+4=7,PSA=6.75  . Incontinence     hx  over 1 year,leaks without awareness  . Arthritis   . GERD (gastroesophageal reflux disease)   . Ulcer     hx gastric  . Hypercholesterolemia   . Heart murmur   . Chronic kidney disease     nephrolithiasis  . Cataract     surgery,B/L  . CAD (coronary artery disease)     a. Cath September 2015 LIMA to the LAD patent, SVG to PDA patent, SVG to posterior lateral patent, SVG to OM with a 90% in-stent restenosis at an anastomotic lesion. This was treated with angioplasty. b. cath 04/01/2015 95% ISR in SVG to OM treated with 2.75x24 Synergy DES postdilated to 3.98mm, all other grafts patent  . Shortness of breath dyspnea   . Diabetes mellitus     TYPE 2  . PAF (paroxysmal atrial fibrillation) ([place])     in the setting of ischemia on 03/31/2015, holding off on starting coumadin after received DAPT, will monitor for recurrence with 30  day event monitor   Past Surgical History  Procedure Laterality Date  . Appendectomy    . Hernia repair    . Carpal tunnel release  2004    both hands  . Laparoscopic cholecystectomy  09/2009  . Incision and drainage of right chest abscess    . Coronary artery bypass graft    . Robot assisted laparoscopic radical prostatectomy  2005  . Back surgery    . Rectal surgery    . Cystoscopy  08/23/12    incomplete emoptying bladder  . Left heart catheterization with coronary angiogram N/A 07/19/2014    Procedure: LEFT HEART CATHETERIZATION WITH CORONARY ANGIOGRAM;  Surgeon: Leonie Man, MD;  Location: [facility] CATH LAB;  Service: Cardiovascular;  Laterality: N/A;  . Cardiac catheterization N/A 04/01/2015    Procedure: Left Heart Cath and Cors/Grafts Angiography;  Surgeon: Jettie Booze, MD;  Location: [place] CV LAB;  Service: Cardiovascular;  Laterality: N/A;  . Cardiac catheterization N/A 04/01/2015    Procedure: Coronary Stent Intervention;  Surgeon: Jettie Booze, MD;  Location: [place] CV LAB;  Service: Cardiovascular;  Laterality: N/A;    reports that he quit smoking about 43 years ago. His smoking use included Cigarettes. He has a 2.5 pack-year smoking history. He has never used smokeless tobacco. He reports that he does not drink alcohol or use illicit drugs. family history includes Cancer in his  mother. Allergies  Allergen Reactions  . Codeine   . Morphine     REACTION: does not like  '    Review of Systems  Constitutional: Negative for fever, chills, fatigue and unexpected weight change.  Eyes: Negative for visual disturbance.  Respiratory: Negative for cough, chest tightness and shortness of breath.   Cardiovascular: Negative for chest pain, palpitations and leg swelling.  Genitourinary: Positive for urgency. Negative for difficulty urinating.  Neurological: Negative for dizziness, syncope, weakness, light-headedness and headaches.       Objective:    Physical Exam  Constitutional: He is oriented to person, place, and time. He appears well-developed and well-nourished.  HENT:  Right Ear: External ear normal.  Left Ear: External ear normal.  Mouth/Throat: Oropharynx is clear and moist.  Eyes: Pupils are equal, round, and reactive to light.  Neck: Neck supple. No thyromegaly present.  Cardiovascular: Normal rate and regular rhythm.   Pulmonary/Chest: Effort normal and breath sounds normal. No respiratory distress. He has no wheezes. He has no rales.  Musculoskeletal: He exhibits no edema.  Neurological: He is alert and oriented to person, place, and time.          Assessment & Plan:  #1 type 2 diabetes. History of fair control. Recheck A1c  #2 hypertension well controlled. Continue current regimen  #3 urinary urgency. Trial of Myrbetriq 25 mg once daily. Reviewed potential side effects.  #4 chronic kidney disease. Recheck basic metabolic panel  #5 history of dyslipidemia and CAD. Check lipid and hepatic panel  [name] MD [facility]

## 2016-04-30 ENCOUNTER — Other Ambulatory Visit: Payer: Self-pay | Admitting: Cardiology

## 2016-04-30 NOTE — Telephone Encounter (Signed)
Rx has been sent to the pharmacy electronically. ° °

## 2016-05-01 ENCOUNTER — Telehealth: Payer: Self-pay

## 2016-05-01 NOTE — Telephone Encounter (Signed)
Patient is on the list for [employer] 2017 and may be a good candidate for an AWV in 2017. Please let me know if/when appt is scheduled.   

## 2016-05-05 NOTE — Telephone Encounter (Signed)
Call to [name] to discuss AWV; Request to come in on Friday at [place]. First available 10am apt is Sept 1st;  Agreed and will send letter out as a reminder.

## 2016-05-16 ENCOUNTER — Other Ambulatory Visit: Payer: Self-pay | Admitting: Cardiology

## 2016-05-21 ENCOUNTER — Ambulatory Visit: Payer: Medicare Other

## 2016-06-07 ENCOUNTER — Other Ambulatory Visit: Payer: Self-pay | Admitting: Family Medicine

## 2016-06-08 NOTE — Telephone Encounter (Signed)
Last OV 04-28-2016  Last refill on Alprazolam was 03/10/16 #90 Please advise

## 2016-06-08 NOTE — Telephone Encounter (Signed)
Refill Crestor for one year. Refill Alprazolam once.

## 2016-06-12 ENCOUNTER — Other Ambulatory Visit: Payer: Self-pay | Admitting: Family Medicine

## 2016-06-14 NOTE — Telephone Encounter (Signed)
Rx refill sent to pharmacy. 

## 2016-06-17 NOTE — Progress Notes (Addendum)
Subjective:   [name] is a 82 y.o. male who presents for Medicare Annual/Subsequent preventive examination.     HRA assessment completed during this visit with [name]   The Patient was informed that the wellness visit is to identify future health risk and educate and initiate measures that can reduce risk for increased disease through the lifespan.    NO ROS; Medicare Wellness Visit Last OV:  04/2016 Labs completed:  04/28/2016; PMH CHF; HTN; Prostate cancer 2005 CAD; Cath Sept 2015/ lipids trig 150; HDL 32  LDL 56  DM A1c 6.4/GFR 36  C/o of some sob p activity/ has apt to see dr. Percival Spanish in approx 4 weeks;  Continues in this assessment to c/o of vague feeling of fatigue; nausea in the am that appears unrelated to [facility]; describes intermittent pain starting from left to right across mid chest with increasing sob after 10 min of exertion of at times while is in the his chair. States he took 2 ntg x 2 weeks ago and the pain went away.  [name] states he "hates" to c.o but feels this is a little different and is concerned. Discussed with Dr. Elease Hashimoto and the patient agreed to call Dr. Warren Lacy today; is going on vacation next week.   Also made a 4pm apt to see Dr. Elease Hashimoto due to ongoing UA incontinence and review of med.   Describes health as "not good'  States prostate cancer is coming back PSA 3.8 /  Went over to the cancer center and told him not to worry  Not monitoring BS regularly  Lifestyle review and risk: (mother had cancer )  Wife today and has been married x 58 years  2 children; 3 grands and 5 great grands and 2 on the way   Tobacco: former smoker 2.5 pack years   How many drinks do you have per week? No   Medications: Taking Birabegron ER and it is not helping.  Still having frequency and leaking urine  States this is an issue every day  takesing spironolactone 1 pill q d; not 1/2 bid   BMI: 30   Diet;   Breakfast; does eat much breakfast Lunch eats a  sandwich Dinner; [name]; wife cooks some; Vegetables;  Very little meat / will eat chicken    Exercise;   miminal housekeeping Likes to work outside in [employer] Big yard to [place].  HOME SAFETY; 2 levels but lives on the bottom level  2 bathrooms; one bathroom is just a shower;  Has a stool in the shower  Fall hx; had one fall; stumbled over a log hidden under the trees Hurt risk and shoulder Ladders at risk;   Gait:  Given education on "Fall Prevention in the Home" for more safety tips the patient can apply as appropriate.  Long term goal is to "age in place" or undecided    Safety features reviewed for safe community; firearms if in the home; smoke alarms; sun protection when outside; driving difficulties or accidents/ no car accidents   Mental Health:  Any emotional problems? Anxious, depressed, irritable, sad or blue? Not really. Sick every am; feel better  Denies feeling depressed or hopeless; voices pleasure in daily life How many social activities have you been engaged in within the last 2 weeks? No  Who would help you with chores; illness; shopping other?  Pain: 1-10_ states he is always "sickish" Feels tired and pain across the lower part of his stomach all  the time.  approx a 2; not much pain but continues;    Cognitive;  Manages checkbook, medications; no failures of task Wife here today states he can be "forgetful". MMSE 25/30; difficulty with math; 1/5 subtracting 7 form 100; Orientation x 1 to month;  Wife manages his meds  Ad8 score reviewed for issues;  Issues making decisions; no  Less interest in hobbies / activities" no  Repeats questions, stories; family complaining: NO  Trouble using ordinary gadgets; microwave; computer: no  Forgets the month or year: forgets dates   Mismanaging finances: no ; wife does most of this   Missing apt: no but does write them down  Daily problems with thinking of memory NO Ad8 score is 1   Sleep  pattern changes; does not sleep;  Up to bathroom x 3 / to fup with MD   Urinary or fecal incontinence reviewed: yes/ under treatment   Advanced Directive addressed; Completed or educated   Counseling Health Maintenance Gaps: Zostavax/ [facility] [place] [facility]  Flu shot agrees to take the high does flu shot today   Colonoscopy; 06/2009/ [facility] 04/2012  EKG: 12/2015  Prostate cancer screening: prostatectomy   Hearing:  Can't hear in left ear Right ear [facility]  Has had hearing aids but doesn't like them   Ophthalmology exam; [place] does and has new glasses    Immunizations Due: (Vaccines reviewed and educated regarding any overdue)   Individual Goal: to conserve energy   Health Recommendations and Referrals To Dr. Sheldon Silvan for urinary issues that continue  To call Dr. Percival Spanish to discuss continued sob and chest pain which he feels is worse; with added fatigue and malaise   Barriers to Success None    Current Care Team reviewed and updated       Objective:    Vitals: BP (!) 150/70   Pulse (!) 59   Ht 5\' 9"  (1.753 m)   Wt 187 lb (84.8 kg)   SpO2 97%   BMI 27.62 kg/m   Body mass index is 27.62 kg/m.  Tobacco History  Smoking Status  . Former Smoker  . Packs/day: 0.50  . Years: 5.00  . Types: Cigarettes  . Quit date: 07/20/1972  Smokeless Tobacco  . Never Used    Comment: quit s     Counseling given: Not Answered   Past Medical History:  Diagnosis Date  . Arthritis   . CAD (coronary artery disease)    a. Cath September 2015 LIMA to the LAD patent, SVG to PDA patent, SVG to posterior lateral patent, SVG to OM with a 90% in-stent restenosis at an anastomotic lesion. This was treated with angioplasty. b. cath 04/01/2015 95% ISR in SVG to OM treated with 2.75x24 Synergy DES postdilated to 3.6mm, all other grafts patent  . Cataract    surgery,B/L  . CHF (congestive heart failure)  ([place])   . Chronic kidney disease    nephrolithiasis  . Diabetes mellitus    TYPE 2  . GERD (gastroesophageal reflux disease)   . Heart murmur   . Hernia   . Hypercholesterolemia   . Hypertension   . Incontinence    hx  over 1 year,leaks without awareness  . Other and unspecified diseases of appendix   . PAF (paroxysmal atrial fibrillation) ([place])    in the setting of ischemia on 03/31/2015, holding off on starting coumadin after received DAPT,  will monitor for recurrence with 30 day event monitor  . Pancreatitis   . Prostate CA ([place]) 03/01/04   prostate bx=Adenocarcinoam,gleason 3+4=7,PSA=6.75  . Shortness of breath dyspnea   . Ulcer    hx gastric   Past Surgical History:  Procedure Laterality Date  . APPENDECTOMY    . BACK SURGERY    . CARDIAC CATHETERIZATION N/A 04/01/2015   Procedure: Left Heart Cath and Cors/Grafts Angiography;  Surgeon: Jettie Booze, MD;  Location: [place] CV LAB;  Service: Cardiovascular;  Laterality: N/A;  . CARDIAC CATHETERIZATION N/A 04/01/2015   Procedure: Coronary Stent Intervention;  Surgeon: Jettie Booze, MD;  Location: [place] CV LAB;  Service: Cardiovascular;  Laterality: N/A;  . CARPAL TUNNEL RELEASE  2004   both hands  . CORONARY ARTERY BYPASS GRAFT    . CYSTOSCOPY  08/23/12   incomplete emoptying bladder  . HERNIA REPAIR    . incision and drainage of right chest abscess    . LAPAROSCOPIC CHOLECYSTECTOMY  09/2009  . LEFT HEART CATHETERIZATION WITH CORONARY ANGIOGRAM N/A 07/19/2014   Procedure: LEFT HEART CATHETERIZATION WITH CORONARY ANGIOGRAM;  Surgeon: Leonie Man, MD;  Location[facility] CATH LAB;  Service: Cardiovascular;  Laterality: N/A;  . RECTAL SURGERY    . ROBOT ASSISTED LAPAROSCOPIC RADICAL PROSTATECTOMY  2005   Family History  Problem Relation Age of Onset  . Cancer Mother     stomach   History  Sexual Activity  . Sexual activity: No    Outpatient Encounter Prescriptions as of 06/18/2016  Medication Sig  .  Albuterol Sulfate (PROAIR RESPICLICK) [id number] (90 Base) MCG/ACT AEPB Inhale 2 puffs into the lungs every 4 (four) hours.  . ALPRAZolam (XANAX) 1 MG tablet TAKE 1/2-1 TABLETS 3 TIMES DAILY AS NEEDED  . amLODipine (NORVASC) 10 MG tablet Take 1 tablet (10 mg total) by mouth daily.  Marland Kitchen aspirin EC 81 MG EC tablet Take 1 tablet (81 mg total) by mouth daily.  . carvedilol (COREG) 12.5 MG tablet TAKE 1 TABLET BY MOUTH TWICE A DAY WITH A MEAL  . clopidogrel (PLAVIX) 75 MG tablet TAKE 1 TABLET (75 MG TOTAL) BY MOUTH DAILY.  . furosemide (LASIX) 40 MG tablet Take 1/2 tablet daily.  Marland Kitchen glucose blood (FREESTYLE LITE) test strip 1 each by Other route as needed. Use as instructed   . JANUVIA 50 MG tablet TAKE 1 TABLET BY MOUTH EVERY DAY  . Lancets (FREESTYLE) lancets 1 each by Other route as needed. Use as instructed   . mirabegron ER (MYRBETRIQ) 25 MG TB24 tablet Take 1 tablet (25 mg total) by mouth daily.  Marland Kitchen NITROSTAT 0.4 MG SL tablet USE AS DIRECTED  . pantoprazole (PROTONIX) 40 MG tablet TAKE 1 TABLET (40 MG TOTAL) BY MOUTH DAILY.  . rosuvastatin (CRESTOR) 20 MG tablet TAKE 1 TABLET (20 MG TOTAL) BY MOUTH DAILY.  Marland Kitchen spironolactone (ALDACTONE) 25 MG tablet Take 0.5 tablets (12.5 mg total) by mouth 2 (two) times daily.  . traMADol (ULTRAM) 50 MG tablet Take 1 tablet (50 mg total) by mouth every 6 (six) hours as needed. 1-2 every 6 h prn pain   No facility-administered encounter medications on file as of 06/18/2016.     Activities of Daily Living No flowsheet data found.  Patient Care Team: [name], MD as PCP - General (Family Medicine)   Assessment:     Exercise Activities and Dietary recommendations    Goals    . patient  Will conserve energy;  Do 10 min intervals;        Fall Risk Fall Risk  01/12/2016 12/04/2014 12/03/2013 12/03/2013  Falls in the past year? Yes No No No  Number falls in past yr: 1 - - -   Depression Screen PHQ 2/9 Scores 01/12/2016 12/04/2014 12/03/2013  12/03/2013  PHQ - 2 Score 0 0 0 0    Cognitive Testing MMSE - Mini Mental State Exam 06/18/2016  Orientation to time 4  Orientation to Place 5  Registration 3  Attention/ Calculation 1  Recall 3  Language- name 2 objects 2  Language- repeat 1  Language- follow 3 step command 3  Language- read & follow direction 1  Write a sentence 1  Copy design 1  Total score 25    Immunization History  Administered Date(s) Administered  . Influenza Split 10/04/2011  . Influenza Whole 07/16/2009  . Influenza, High Dose Seasonal PF 06/18/2016  . Influenza,inj,Quad PF,36+ Mos 08/13/2014  . Pneumococcal Conjugate-13 05/31/2008, 06/03/2014  . Pneumococcal Polysaccharide-23 11/17/2006  . Td 11/23/2007  . Zoster 10/18/2013   Screening Tests Health Maintenance  Topic Date Due  . ZOSTAVAX  03/10/1995  . URINE MICROALBUMIN  09/28/2007  . PNA vac Low Risk Adult (2 of 2 - PPSV23) 06/04/2015  . FOOT EXAM  12/05/2015  . INFLUENZA VACCINE  05/18/2016  . OPHTHALMOLOGY EXAM  01/14/2017 (Originally 12/05/2015)  . HEMOGLOBIN A1C  10/29/2016  . TETANUS/TDAP  11/22/2017      Plan:     Will discuss sob; chest issue with dr. Sheldon Silvan for direction  Took the high dose flu shot today  Will make apt with Dr. Elease Hashimoto    During the course of the visit the patient was educated and counseled about the following appropriate screening and preventive services:   Vaccines to include Pneumoccal, Influenza, Hepatitis B, Td, Zostavax, HCV  Electrocardiogram followed by Cardiology  Cardiovascular Disease followed Dr. Percival Spanish  Colorectal cancer screening/ aged put unless indicated   Diabetes screening/ ongoing   Prostate Cancer Screening  Glaucoma screening completed at [facility]  Nutrition counseling   Smoking cessation: remote hx   Patient Instructions (the written plan) was given to the patient.    [id number], RN  06/18/2016  Agree with assessment and plan as per Wynetta Fines above. Pt has  some urine urgency and has been scheduled for appt later in the day with me.   [name] MD [facility]

## 2016-06-18 ENCOUNTER — Encounter: Payer: Self-pay | Admitting: Family Medicine

## 2016-06-18 ENCOUNTER — Ambulatory Visit (INDEPENDENT_AMBULATORY_CARE_PROVIDER_SITE_OTHER): Payer: Medicare Other | Admitting: Family Medicine

## 2016-06-18 ENCOUNTER — Ambulatory Visit (INDEPENDENT_AMBULATORY_CARE_PROVIDER_SITE_OTHER): Payer: Medicare Other

## 2016-06-18 VITALS — BP 116/70 | HR 69 | Temp 98.1°F | Ht 69.0 in | Wt 186.0 lb

## 2016-06-18 DIAGNOSIS — Z0000 Encounter for general adult medical examination without abnormal findings: Secondary | ICD-10-CM

## 2016-06-18 DIAGNOSIS — R3915 Urgency of urination: Secondary | ICD-10-CM | POA: Diagnosis not present

## 2016-06-18 DIAGNOSIS — Z23 Encounter for immunization: Secondary | ICD-10-CM

## 2016-06-18 MED ORDER — SOLIFENACIN SUCCINATE 5 MG PO TABS: 5.0000 mg | ORAL_TABLET | Freq: Every day | ORAL | 6 refills | Status: DC

## 2016-06-18 NOTE — Progress Notes (Signed)
Subjective:     Patient ID: [name], male   DOB: [birth year], 82 y.o.   MRN: [id number]  HPI Patient seen with increased urinary urgency symptoms. He's had some of these similar symptoms in the past but especially progressive in recent weeks and months. He denies any slow urinary stream. No burning with urination. Frequently gets up 3-4 times at night. Minimal caffeine use. He is mostly complaining of extreme urgency. When he gets the urge to go he has difficulty making it in time. He is not having any true urine or stool incontinence. He does have past history of prostate cancer. He's had previous robotic prostatectomy No recent fevers or chills. Currently on Myrbetriq 25 mg once daily  Past Medical History:  Diagnosis Date  . Arthritis   . CAD (coronary artery disease)    a. Cath September 2015 LIMA to the LAD patent, SVG to PDA patent, SVG to posterior lateral patent, SVG to OM with a 90% in-stent restenosis at an anastomotic lesion. This was treated with angioplasty. b. cath 04/01/2015 95% ISR in SVG to OM treated with 2.75x24 Synergy DES postdilated to 3.61mm, all other grafts patent  . Cataract    surgery,B/L  . CHF (congestive heart failure) ([place])   . Chronic kidney disease    nephrolithiasis  . Diabetes mellitus    TYPE 2  . GERD (gastroesophageal reflux disease)   . Heart murmur   . Hernia   . Hypercholesterolemia   . Hypertension   . Incontinence    hx  over 1 year,leaks without awareness  . Other and unspecified diseases of appendix   . PAF (paroxysmal atrial fibrillation) ([place])    in the setting of ischemia on 03/31/2015, holding off on starting coumadin after received DAPT, will monitor for recurrence with 30 day event monitor  . Pancreatitis   . Prostate CA ([place]) 03/01/04   prostate bx=Adenocarcinoam,gleason 3+4=7,PSA=6.75  . Shortness of breath dyspnea   . Ulcer    hx gastric   Past Surgical History:  Procedure Laterality Date  . APPENDECTOMY    . BACK SURGERY     . CARDIAC CATHETERIZATION N/A 04/01/2015   Procedure: Left Heart Cath and Cors/Grafts Angiography;  Surgeon: Jettie Booze, MD;  Location: [place] CV LAB;  Service: Cardiovascular;  Laterality: N/A;  . CARDIAC CATHETERIZATION N/A 04/01/2015   Procedure: Coronary Stent Intervention;  Surgeon: Jettie Booze, MD;  Location: [place] CV LAB;  Service: Cardiovascular;  Laterality: N/A;  . CARPAL TUNNEL RELEASE  2004   both hands  . CORONARY ARTERY BYPASS GRAFT    . CYSTOSCOPY  08/23/12   incomplete emoptying bladder  . HERNIA REPAIR    . incision and drainage of right chest abscess    . LAPAROSCOPIC CHOLECYSTECTOMY  09/2009  . LEFT HEART CATHETERIZATION WITH CORONARY ANGIOGRAM N/A 07/19/2014   Procedure: LEFT HEART CATHETERIZATION WITH CORONARY ANGIOGRAM;  Surgeon: Leonie Man, MD;  Location: [facility] CATH LAB;  Service: Cardiovascular;  Laterality: N/A;  . RECTAL SURGERY    . ROBOT ASSISTED LAPAROSCOPIC RADICAL PROSTATECTOMY  2005    reports that he quit smoking about 43 years ago. His smoking use included Cigarettes. He has a 2.50 pack-year smoking history. He has never used smokeless tobacco. He reports that he does not drink alcohol or use drugs. family history includes Cancer in his mother. Allergies  Allergen Reactions  . Codeine   . Morphine     REACTION: does not like  Review of Systems  Constitutional: Negative for chills and fever.  Respiratory: Negative for shortness of breath.   Gastrointestinal: Negative for abdominal pain.  Genitourinary: Positive for frequency and urgency. Negative for decreased urine volume, flank pain and hematuria.       Objective:   Physical Exam  Constitutional: He appears well-developed and well-nourished.  Cardiovascular: Normal rate and regular rhythm.   Pulmonary/Chest: Effort normal and breath sounds normal. No respiratory distress. He has no wheezes. He has no rales.       Assessment:     Urine urgency. Not improved  with Myrbetriq.  He had prior history of prostatectomy    Plan:     -Trial of Vesicare 5 mg once daily. We reviewed possible side effects including dry mouth and constipation. Stay well-hydrated. Plenty of fiber. He does not have any contraindications such as history of small bowel obstruction. -Patient has follow-up in one month and reassess at that point  [name] MD [facility]

## 2016-06-18 NOTE — Patient Instructions (Addendum)
[name] , Thank you for taking time to come for your Medicare Wellness Visit. I appreciate your ongoing commitment to your health goals. Please review the following plan we discussed and let me know if I can assist you in the future.   Will discuss sob; chest issue with dr. Sheldon Silvan for direction  Took the high dose flu shot today  Will make apt with Dr. Elease Hashimoto   These are the goals we discussed: Goals    . patient          Will conserve energy;  Do 10 min intervals;         This is a list of the screening recommended for you and due dates:  Health Maintenance  Topic Date Due  . Shingles Vaccine  03/10/1995  . Urine Protein Check  09/28/2007  . Pneumonia vaccines (2 of 2 - PPSV23) 06/04/2015  . Complete foot exam   12/05/2015  . Eye exam for diabetics  12/05/2015  . Flu Shot  05/18/2016  . Hemoglobin A1C  10/29/2016  . Tetanus Vaccine  11/22/2017     Fall Prevention in the Home  Falls can cause injuries. They can happen to people of all ages. There are many things you can do to make your home safe and to help prevent falls.  WHAT CAN I DO ON THE OUTSIDE OF MY HOME?  Regularly fix the edges of walkways and driveways and fix any cracks.  Remove anything that might make you trip as you walk through a door, such as a raised step or threshold.  Trim any bushes or trees on the path to your home.  Use bright outdoor lighting.  Clear any walking paths of anything that might make someone trip, such as rocks or tools.  Regularly check to see if handrails are loose or broken. Make sure that both sides of any steps have handrails.  Any raised decks and porches should have guardrails on the edges.  Have any leaves, snow, or ice cleared regularly.  Use sand or salt on walking paths during winter.  Clean up any spills in your garage right away. This includes oil or grease spills. WHAT CAN I DO IN THE BATHROOM?   Use night lights.  Install grab bars by the toilet and  in the tub and shower. Do not use towel bars as grab bars.  Use non-skid mats or decals in the tub or shower.  If you need to sit down in the shower, use a plastic, non-slip stool.  Keep the floor dry. Clean up any water that spills on the floor as soon as it happens.  Remove soap buildup in the tub or shower regularly.  Attach bath mats securely with double-sided non-slip rug tape.  Do not have throw rugs and other things on the floor that can make you trip. WHAT CAN I DO IN THE BEDROOM?  Use night lights.  Make sure that you have a light by your bed that is easy to reach.  Do not use any sheets or blankets that are too big for your bed. They should not hang down onto the floor.  Have a firm chair that has side arms. You can use this for support while you get dressed.  Do not have throw rugs and other things on the floor that can make you trip. WHAT CAN I DO IN THE KITCHEN?  Clean up any spills right away.  Avoid walking on wet floors.  Keep items that you use  a lot in easy-to-reach places.  If you need to reach something above you, use a strong step stool that has a grab bar.  Keep electrical cords out of the way.  Do not use floor polish or wax that makes floors slippery. If you must use wax, use non-skid floor wax.  Do not have throw rugs and other things on the floor that can make you trip. WHAT CAN I DO WITH MY STAIRS?  Do not leave any items on the stairs.  Make sure that there are handrails on both sides of the stairs and use them. Fix handrails that are broken or loose. Make sure that handrails are as long as the stairways.  Check any carpeting to make sure that it is firmly attached to the stairs. Fix any carpet that is loose or worn.  Avoid having throw rugs at the top or bottom of the stairs. If you do have throw rugs, attach them to the floor with carpet tape.  Make sure that you have a light switch at the top of the stairs and the bottom of the stairs.  If you do not have them, ask someone to add them for you. WHAT ELSE CAN I DO TO HELP PREVENT FALLS?  Wear shoes that:  Do not have high heels.  Have rubber bottoms.  Are comfortable and fit you well.  Are closed at the toe. Do not wear sandals.  If you use a stepladder:  Make sure that it is fully opened. Do not climb a closed stepladder.  Make sure that both sides of the stepladder are locked into place.  Ask someone to hold it for you, if possible.  Clearly mark and make sure that you can see:  Any grab bars or handrails.  First and last steps.  Where the edge of each step is.  Use tools that help you move around (mobility aids) if they are needed. These include:  Canes.  Walkers.  Scooters.  Crutches.  Turn on the lights when you go into a dark area. Replace any light bulbs as soon as they burn out.  Set up your furniture so you have a clear path. Avoid moving your furniture around.  If any of your floors are uneven, fix them.  If there are any pets around you, be aware of where they are.  Review your medicines with your doctor. Some medicines can make you feel dizzy. This can increase your chance of falling. Ask your doctor what other things that you can do to help prevent falls.   This information is not intended to replace advice given to you by your health care provider. Make sure you discuss any questions you have with your health care provider.   Document Released: 07/31/2009 Document Revised: 02/18/2015 Document Reviewed: 11/08/2014 Elsevier Interactive Patient Education 2016 [place] Maintenance, Male A healthy lifestyle and preventative care can promote health and wellness.  Maintain regular health, dental, and eye exams.  Eat a healthy diet. Foods like vegetables, fruits, whole grains, low-fat dairy products, and lean protein foods contain the nutrients you need and are low in calories. Decrease your intake of foods high in solid  fats, added sugars, and salt. Get information about a proper diet from your health care provider, if necessary.  Regular physical exercise is one of the most important things you can do for your health. Most adults should get at least 150 minutes of moderate-intensity exercise (any activity that increases your heart rate and causes  you to sweat) each week. In addition, most adults need muscle-strengthening exercises on 2 or more days a week.   Maintain a healthy weight. The body mass index (BMI) is a screening tool to identify possible weight problems. It provides an estimate of body fat based on height and weight. Your health care provider can find your BMI and can help you achieve or maintain a healthy weight. For males 20 years and older:  A BMI below 18.5 is considered underweight.  A BMI of 18.5 to 24.9 is normal.  A BMI of 25 to 29.9 is considered overweight.  A BMI of 30 and above is considered obese.  Maintain normal blood lipids and cholesterol by exercising and minimizing your intake of saturated fat. Eat a balanced diet with plenty of fruits and vegetables. Blood tests for lipids and cholesterol should begin at age 13 and be repeated every 5 years. If your lipid or cholesterol levels are high, you are over age 59, or you are at high risk for heart disease, you may need your cholesterol levels checked more frequently.Ongoing high lipid and cholesterol levels should be treated with medicines if diet and exercise are not working.  If you smoke, find out from your health care provider how to quit. If you do not use tobacco, do not start.  Lung cancer screening is recommended for adults aged 14-80 years who are at high risk for developing lung cancer because of a history of smoking. A yearly low-dose CT scan of the lungs is recommended for people who have at least a 30-pack-year history of smoking and are current smokers or have quit within the past 15 years. A pack year of smoking is  smoking an average of 1 pack of cigarettes a day for 1 year (for example, a 30-pack-year history of smoking could mean smoking 1 pack a day for 30 years or 2 packs a day for 15 years). Yearly screening should continue until the smoker has stopped smoking for at least 15 years. Yearly screening should be stopped for people who develop a health problem that would prevent them from having lung cancer treatment.  If you choose to drink alcohol, do not have more than 2 drinks per day. One drink is considered to be 12 oz (360 mL) of beer, 5 oz (150 mL) of wine, or 1.5 oz (45 mL) of liquor.  Avoid the use of street drugs. Do not share needles with anyone. Ask for help if you need support or instructions about stopping the use of drugs.  High blood pressure causes heart disease and increases the risk of stroke. High blood pressure is more likely to develop in:  People who have blood pressure in the end of the normal range (100-139/85-89 mm Hg).  People who are overweight or obese.  People who are African American.  If you are 56-55 years of age, have your blood pressure checked every 3-5 years. If you are 85 years of age or older, have your blood pressure checked every year. You should have your blood pressure measured twice--once when you are at a hospital or clinic, and once when you are not at a hospital or clinic. Record the average of the two measurements. To check your blood pressure when you are not at a hospital or clinic, you can use:  An automated blood pressure machine at a pharmacy.  A home blood pressure monitor.  If you are 18-71 years old, ask your health care provider if you should take  aspirin to prevent heart disease.  Diabetes screening involves taking a blood sample to check your fasting blood sugar level. This should be done once every 3 years after age 62 if you are at a normal weight and without risk factors for diabetes. Testing should be considered at a younger age or be  carried out more frequently if you are overweight and have at least 1 risk factor for diabetes.  Colorectal cancer can be detected and often prevented. Most routine colorectal cancer screening begins at the age of 67 and continues through age 3. However, your health care provider may recommend screening at an earlier age if you have risk factors for colon cancer. On a yearly basis, your health care provider may provide home test kits to check for hidden blood in the stool. A small camera at the end of a tube may be used to directly examine the colon (sigmoidoscopy or colonoscopy) to detect the earliest forms of colorectal cancer. Talk to your health care provider about this at age 44 when routine screening begins. A direct exam of the colon should be repeated every 5-10 years through age 43, unless early forms of precancerous polyps or small growths are found.  People who are at an increased risk for hepatitis B should be screened for this virus. You are considered at high risk for hepatitis B if:  You were born in a country where hepatitis B occurs often. Talk with your health care provider about which countries are considered high risk.  Your parents were born in a high-risk country and you have not received a shot to protect against hepatitis B (hepatitis B vaccine).  You have HIV or AIDS.  You use needles to inject street drugs.  You live with, or have sex with, someone who has hepatitis B.  You are a man who has sex with other men (MSM).  You get hemodialysis treatment.  You take certain medicines for conditions like cancer, organ transplantation, and autoimmune conditions.  Hepatitis C blood testing is recommended for all people born from [birth year] through 1963 and any individual with known risk factors for hepatitis C.  Healthy men should no longer receive prostate-specific antigen (PSA) blood tests as part of routine cancer screening. Talk to your health care provider about prostate  cancer screening.  Testicular cancer screening is not recommended for adolescents or adult males who have no symptoms. Screening includes self-exam, a health care provider exam, and other screening tests. Consult with your health care provider about any symptoms you have or any concerns you have about testicular cancer.  Practice safe sex. Use condoms and avoid high-risk sexual practices to reduce the spread of sexually transmitted infections (STIs).  You should be screened for STIs, including gonorrhea and chlamydia if:  You are sexually active and are younger than 24 years.  You are older than 24 years, and your health care provider tells you that you are at risk for this type of infection.  Your sexual activity has changed since you were last screened, and you are at an increased risk for chlamydia or gonorrhea. Ask your health care provider if you are at risk.  If you are at risk of being infected with HIV, it is recommended that you take a prescription medicine daily to prevent HIV infection. This is called pre-exposure prophylaxis (PrEP). You are considered at risk if:  You are a man who has sex with other men (MSM).  You are a heterosexual man who is sexually active  with multiple partners.  You take drugs by injection.  You are sexually active with a partner who has HIV.  Talk with your health care provider about whether you are at high risk of being infected with HIV. If you choose to begin PrEP, you should first be tested for HIV. You should then be tested every 3 months for as long as you are taking PrEP.  Use sunscreen. Apply sunscreen liberally and repeatedly throughout the day. You should seek shade when your shadow is shorter than you. Protect yourself by wearing long sleeves, pants, a wide-brimmed hat, and sunglasses year round whenever you are outdoors.  Tell your health care provider of new moles or changes in moles, especially if there is a change in shape or color.  Also, tell your health care provider if a mole is larger than the size of a pencil eraser.  A one-time screening for abdominal aortic aneurysm (AAA) and surgical repair of large AAAs by ultrasound is recommended for men aged 12-75 years who are current or former smokers.  Stay current with your vaccines (immunizations).   This information is not intended to replace advice given to you by your health care provider. Make sure you discuss any questions you have with your health care provider.   Document Released: 04/01/2008 Document Revised: 10/25/2014 Document Reviewed: 03/01/2011 Elsevier Interactive Patient Education [employer].

## 2016-06-18 NOTE — Patient Instructions (Signed)
Minimize caffeine use Let me know if Vesicare not helping over the next two weeks. Avoid drinking too much late at night.

## 2016-07-08 NOTE — Progress Notes (Signed)
HPI The patient presents for followup of CAD.   He had chest pain and had a cardiac catheterization on 04/01/2015 which showed a 95% in-stent restenosis in SVG to OM, treated with 2.75 x 24 mm Synergy DES postdilated to 3.3 mm. He had patent LIMA to LAD, patent SVG to posterolateral, patent SVG to PDA.    Of note he did have atrial fibrillation with rate control on presentation but he converted spontaneously. He was not started on warfarin because he was taking aspirin and Plavix. He returns for follow up.  He says he's been doing relatively well. He continues to get some chest discomfort periodically. This seems to be a daily pattern and it is being monitored and unchanged from previous. It is right-sided and he feels a bubbling under his chest. It might happen with activity but not necessarily. He's taken about 4 sublingual nitroglycerin this entire year. It is hard to tease out some of the symptoms but they seemed to be unchanged from the time of his last catheterization. He tries to stay active. He does yard work.  Allergies  Allergen Reactions  . Codeine   . Morphine     REACTION: does not like    Current Outpatient Prescriptions  Medication Sig Dispense Refill  . Albuterol Sulfate (PROAIR RESPICLICK) 093 (90 Base) MCG/ACT AEPB Inhale 2 puffs into the lungs every 4 (four) hours. 1 each 0  . ALPRAZolam (XANAX) 1 MG tablet TAKE 1/2-1 TABLETS 3 TIMES DAILY AS NEEDED 90 tablet 0  . amLODipine (NORVASC) 10 MG tablet Take 1 tablet (10 mg total) by mouth daily. 90 tablet 3  . carvedilol (COREG) 12.5 MG tablet TAKE 1 TABLET BY MOUTH TWICE A DAY WITH A MEAL 180 tablet 1  . cephALEXin (KEFLEX) 500 MG capsule Take 1 capsule by mouth 2 (two) times daily.    . clopidogrel (PLAVIX) 75 MG tablet TAKE 1 TABLET (75 MG TOTAL) BY MOUTH DAILY. 30 tablet 3  . furosemide (LASIX) 40 MG tablet Take 1/2 tablet daily. 90 tablet 2  . glucose blood (FREESTYLE LITE) test strip 1 each by Other route as needed. Use as  instructed     . JANUVIA 50 MG tablet TAKE 1 TABLET BY MOUTH EVERY DAY 30 tablet 5  . Lancets (FREESTYLE) lancets 1 each by Other route as needed. Use as instructed     . NITROSTAT 0.4 MG SL tablet USE AS DIRECTED 25 tablet 3  . pantoprazole (PROTONIX) 40 MG tablet TAKE 1 TABLET (40 MG TOTAL) BY MOUTH DAILY. 90 tablet 0  . rosuvastatin (CRESTOR) 20 MG tablet TAKE 1 TABLET (20 MG TOTAL) BY MOUTH DAILY. 90 tablet 2  . solifenacin (VESICARE) 5 MG tablet Take 1 tablet (5 mg total) by mouth daily. 30 tablet 6  . spironolactone (ALDACTONE) 25 MG tablet Take 25 mg by mouth daily.    . traMADol (ULTRAM) 50 MG tablet Take 1 tablet (50 mg total) by mouth every 6 (six) hours as needed. 1-2 every 6 h prn pain 120 tablet 5   No current facility-administered medications for this visit.     Past Medical History:  Diagnosis Date  . Arthritis   . CAD (coronary artery disease)    a. Cath September 2015 LIMA to the LAD patent, SVG to PDA patent, SVG to posterior lateral patent, SVG to OM with a 90% in-stent restenosis at an anastomotic lesion. This was treated with angioplasty. b. cath 04/01/2015 95% ISR in SVG to OM treated with  2.75x24 Synergy DES postdilated to 3.61mm, all other grafts patent  . Cataract    surgery,B/L  . CHF (congestive heart failure) ([place])   . Chronic kidney disease    nephrolithiasis  . Diabetes mellitus    TYPE 2  . GERD (gastroesophageal reflux disease)   . Heart murmur   . Hernia   . Hypercholesterolemia   . Hypertension   . Incontinence    hx  over 1 year,leaks without awareness  . Other and unspecified diseases of appendix   . PAF (paroxysmal atrial fibrillation) ([place])    in the setting of ischemia on 03/31/2015, holding off on starting coumadin after received DAPT, will monitor for recurrence with 30 day event monitor  . Pancreatitis   . Prostate CA ([place]) 03/01/04   prostate bx=Adenocarcinoam,gleason 3+4=7,PSA=6.75  . Shortness of breath dyspnea   . Ulcer    hx gastric      Past Surgical History:  Procedure Laterality Date  . APPENDECTOMY    . BACK SURGERY    . CARDIAC CATHETERIZATION N/A 04/01/2015   Procedure: Left Heart Cath and Cors/Grafts Angiography;  Surgeon: Jettie Booze, MD;  Location: [facility];  Service: Cardiovascular;  Laterality: N/A;  . CARDIAC CATHETERIZATION N/A 04/01/2015   Procedure: Coronary Stent Intervention;  Surgeon: Jettie Booze, MD;  Location: [facility];  Service: Cardiovascular;  Laterality: N/A;  . CARPAL TUNNEL RELEASE  2004   both hands  . CORONARY ARTERY BYPASS GRAFT    . CYSTOSCOPY  08/23/12   incomplete emoptying bladder  . HERNIA REPAIR    . incision and drainage of right chest abscess    . LAPAROSCOPIC CHOLECYSTECTOMY  09/2009  . LEFT HEART CATHETERIZATION WITH CORONARY ANGIOGRAM N/A 07/19/2014   Procedure: LEFT HEART CATHETERIZATION WITH CORONARY ANGIOGRAM;  Surgeon: Leonie Man, MD;  Location: [facility];  Service: Cardiovascular;  Laterality: N/A;  . RECTAL SURGERY    . ROBOT ASSISTED LAPAROSCOPIC RADICAL PROSTATECTOMY  2005    ROS:   As stated in the HPI and negative for all other systems.  PHYSICAL EXAM BP 132/77   Pulse (!) 54   Ht 5\' 9"  (1.753 m)   Wt 190 lb (86.2 kg)   BMI 28.06 kg/m  GENERAL:  Well appearing HEENT:  Pupils equal round and reactive, fundi not visualized, oral mucosa unremarkable NECK:  No jugular venous distention, waveform within normal limits, carotid upstroke brisk and symmetric, no bruits, no thyromegaly LUNGS:  Clear to auscultation bilaterally BACK:  No CVA tenderness CHEST:  Well healed sternotomy scar. HEART:  PMI not displaced or sustained,S1 and S2 within normal limits, no S3, no S4, no clicks, no rubs, apical systolic early peaking radiating slightly to the axilla murmur ABD:  Flat, positive bowel sounds normal in frequency in pitch, no bruits, no rebound, no guarding, no midline pulsatile mass, no hepatomegaly, no splenomegaly EXT:  2  plus pulses throughout, no edema, no cyanosis no clubbing  EKG:  Sinus bradycardia, rate 54,  LBBB.  07/09/2016  Lab Results  Component Value Date   CHOL 119 04/28/2016   TRIG 150.0 (H) 04/28/2016   HDL 32.80 (L) 04/28/2016   LDLCALC 56 04/28/2016   LDLDIRECT 143.3 11/23/2007   Lab Results  Component Value Date   HGBA1C 6.4 04/28/2016   Lab Results  Component Value Date   CREATININE 1.90 (H) 04/28/2016    ASSESSMENT AND PLAN   CORONARY ARTERY DISEASE  The patient has no new sypmtoms.  No further cardiovascular testing is indicated.  We will continue with aggressive risk reduction and meds as listed.  His chest pain is his chronic discomfort.  Atrial fib As noted in the previous note the patient has not wanted to take anticoagulation other than Plavix. We discussed were previously but he's not had any symptomatic recurrent paroxysms of this.  He understand the risk of stroke.  Of note he stopped taking ASA and is on Plavix alone.   Hypertension  The blood pressure is at target. No change in medications is indicated. We will continue with therapeutic lifestyle changes (TLC).  Murmur:   He has mild aortic stenosis/assistant 2015 no further imaging is planned  Carotid stenosis-Bilateral  The patient now has bilateral 60-79% stenosis. This was follow up in June and will be followed again in one year.  I reviewed these results with him.   Bradycardia This has been evaluated in the past and there were no sustained or symptomatic bradycardia arrhythmias.  He can continue on the same dose of beta blocker.   CKD: His creatinine was 1.9 in July.  No change in therapy is planned

## 2016-07-09 ENCOUNTER — Encounter: Payer: Self-pay | Admitting: Cardiology

## 2016-07-09 ENCOUNTER — Ambulatory Visit (INDEPENDENT_AMBULATORY_CARE_PROVIDER_SITE_OTHER): Payer: Medicare Other | Admitting: Cardiology

## 2016-07-09 VITALS — BP 132/77 | HR 54 | Ht 69.0 in | Wt 190.0 lb

## 2016-07-09 DIAGNOSIS — I350 Nonrheumatic aortic (valve) stenosis: Secondary | ICD-10-CM | POA: Diagnosis not present

## 2016-07-09 DIAGNOSIS — I6523 Occlusion and stenosis of bilateral carotid arteries: Secondary | ICD-10-CM

## 2016-07-09 DIAGNOSIS — I25700 Atherosclerosis of coronary artery bypass graft(s), unspecified, with unstable angina pectoris: Secondary | ICD-10-CM | POA: Diagnosis not present

## 2016-07-09 NOTE — Patient Instructions (Signed)

## 2016-07-11 ENCOUNTER — Other Ambulatory Visit: Payer: Self-pay | Admitting: Family Medicine

## 2016-07-19 ENCOUNTER — Ambulatory Visit: Payer: Medicare Other | Admitting: Cardiology

## 2016-07-28 ENCOUNTER — Ambulatory Visit (INDEPENDENT_AMBULATORY_CARE_PROVIDER_SITE_OTHER): Payer: Medicare Other | Admitting: Family Medicine

## 2016-07-28 ENCOUNTER — Encounter: Payer: Self-pay | Admitting: Family Medicine

## 2016-07-28 DIAGNOSIS — R3915 Urgency of urination: Secondary | ICD-10-CM

## 2016-07-28 MED ORDER — CEPHALEXIN 500 MG PO CAPS: 500.0000 mg | ORAL_CAPSULE | Freq: Three times a day (TID) | ORAL | 0 refills | Status: DC

## 2016-07-28 NOTE — Progress Notes (Signed)
Pre visit review using our clinic review tool, if applicable. No additional management support is needed unless otherwise documented below in the visit note. 

## 2016-07-28 NOTE — Patient Instructions (Signed)
Take a full 7 days of antibiotic. Stay well hydrated Follow up for any recurrent symptoms after stopping antibiotic.

## 2016-07-28 NOTE — Progress Notes (Signed)
Subjective:     Patient ID: [name], male   DOB: Sep 16, 1933, 82 y.o.   MRN: [id number]  HPI Patient is seen for follow-up regarding urinary urgency. Started Vesicare and nocturia has improved from 4 times night about twice per night. Daytime symptoms also improved. He saw urologist recently and reports he had urinary infection treated with Keflex and improved then after discontinuing antibiotics few days later developed burning. Some leftover Keflex at home and started this back yesterday. No fevers or chills.  Past Medical History:  Diagnosis Date  . Arthritis   . CAD (coronary artery disease)    a. Cath September 2015 LIMA to the LAD patent, SVG to PDA patent, SVG to posterior lateral patent, SVG to OM with a 90% in-stent restenosis at an anastomotic lesion. This was treated with angioplasty. b. cath 04/01/2015 95% ISR in SVG to OM treated with 2.75x24 Synergy DES postdilated to 3.36mm, all other grafts patent  . Cataract    surgery,B/L  . CHF (congestive heart failure) ([place])   . Chronic kidney disease    nephrolithiasis  . Diabetes mellitus    TYPE 2  . GERD (gastroesophageal reflux disease)   . Heart murmur   . Hernia   . Hypercholesterolemia   . Hypertension   . Incontinence    hx  over 1 year,leaks without awareness  . Other and unspecified diseases of appendix   . PAF (paroxysmal atrial fibrillation) ([place])    in the setting of ischemia on 03/31/2015, holding off on starting coumadin after received DAPT, will monitor for recurrence with 30 day event monitor  . Pancreatitis   . Prostate CA ([place]) 03/01/04   prostate bx=Adenocarcinoam,gleason 3+4=7,PSA=6.75  . Shortness of breath dyspnea   . Ulcer ([place])    hx gastric   Past Surgical History:  Procedure Laterality Date  . APPENDECTOMY    . BACK SURGERY    . CARDIAC CATHETERIZATION N/A 04/01/2015   Procedure: Left Heart Cath and Cors/Grafts Angiography;  Surgeon: Jettie Booze, MD;  Location: [place] [facility];   Service: Cardiovascular;  Laterality: N/A;  . CARDIAC CATHETERIZATION N/A 04/01/2015   Procedure: Coronary Stent Intervention;  Surgeon: Jettie Booze, MD;  Location: [place] [facility];  Service: Cardiovascular;  Laterality: N/A;  . CARPAL TUNNEL RELEASE  2004   both hands  . CORONARY ARTERY BYPASS GRAFT    . CYSTOSCOPY  08/23/12   incomplete emoptying bladder  . HERNIA REPAIR    . incision and drainage of right chest abscess    . LAPAROSCOPIC CHOLECYSTECTOMY  09/2009  . LEFT HEART CATHETERIZATION WITH CORONARY ANGIOGRAM N/A 07/19/2014   Procedure: LEFT HEART CATHETERIZATION WITH CORONARY ANGIOGRAM;  Surgeon: Leonie Man, MD;  Location: [facility] CATH LAB;  Service: Cardiovascular;  Laterality: N/A;  . RECTAL SURGERY    . ROBOT ASSISTED LAPAROSCOPIC RADICAL PROSTATECTOMY  2005    reports that he quit smoking about 44 years ago. His smoking use included Cigarettes. He has a 2.50 pack-year smoking history. He has never used smokeless tobacco. He reports that he does not drink alcohol or use drugs. family history includes Cancer in his mother. Allergies  Allergen Reactions  . Codeine   . Morphine     REACTION: does not like     Review of Systems  Constitutional: Negative for chills and fever.  Gastrointestinal: Negative for nausea and vomiting.  Genitourinary: Positive for dysuria and frequency. Negative for hematuria.       Objective:  Physical Exam  Constitutional: He appears well-developed and well-nourished.  Cardiovascular: Normal rate and regular rhythm.   Pulmonary/Chest: Effort normal and breath sounds normal. No respiratory distress. He has no wheezes. He has no rales.  Neurological: He is alert.       Assessment:     #1 urinary urgency improved on Vesicare  #2 dysuria. Reported recent UTI    Plan:     -Continue Vesicare -Finish full 7 day course of Keflex -We elected not to check urine or urine culture today since he restarted back antibiotic yesterday.  Touch base for any recurrent symptoms after finishing current course of Keflex  [name] MD [facility] at [facility]

## 2016-08-18 ENCOUNTER — Other Ambulatory Visit: Payer: Self-pay | Admitting: Cardiology

## 2016-08-18 NOTE — Telephone Encounter (Signed)
Rx(s) sent to pharmacy electronically.  

## 2016-09-10 ENCOUNTER — Other Ambulatory Visit: Payer: Self-pay | Admitting: Family Medicine

## 2016-09-12 ENCOUNTER — Other Ambulatory Visit: Payer: Self-pay | Admitting: Cardiology

## 2016-09-13 NOTE — Telephone Encounter (Signed)
Refill OK

## 2016-09-13 NOTE — Telephone Encounter (Signed)
Last refill on Alprazolam 06-09-2016 #90, 0rf Last OV 07-28-2016  Okay to refill?

## 2016-09-13 NOTE — Telephone Encounter (Signed)
REFILL 

## 2016-10-09 ENCOUNTER — Other Ambulatory Visit: Payer: Self-pay | Admitting: Family Medicine

## 2016-11-07 ENCOUNTER — Other Ambulatory Visit: Payer: Self-pay | Admitting: Cardiology

## 2016-12-14 ENCOUNTER — Other Ambulatory Visit: Payer: Self-pay | Admitting: Family Medicine

## 2016-12-19 ENCOUNTER — Other Ambulatory Visit: Payer: Self-pay | Admitting: Family Medicine

## 2016-12-21 ENCOUNTER — Other Ambulatory Visit: Payer: Self-pay | Admitting: Family Medicine

## 2016-12-22 ENCOUNTER — Telehealth: Payer: Self-pay | Admitting: Family Medicine

## 2016-12-22 NOTE — Telephone Encounter (Signed)
Spoke with pharmacy and Rx refill ready.

## 2016-12-22 NOTE — Telephone Encounter (Signed)
Looks like he got this 12-14-16?  Shouldn't need refill yet.

## 2016-12-22 NOTE — Telephone Encounter (Signed)
Last refill was 12/15/15.  Last office visit was 07/28/16.  Okay to fill?

## 2016-12-22 NOTE — Telephone Encounter (Signed)
° ° ° °  Pt said they never picked up the rx at the pharmacy and is asking it it can be called in again.      ALPRAZolam (XANAX) 1 MG tablet    [facility] [address]

## 2017-01-21 ENCOUNTER — Other Ambulatory Visit: Payer: Self-pay | Admitting: *Deleted

## 2017-01-21 MED ORDER — SOLIFENACIN SUCCINATE 5 MG PO TABS: 5.0000 mg | ORAL_TABLET | Freq: Every day | ORAL | 1 refills | Status: DC

## 2017-01-27 LAB — PSA: PSA: 3.42

## 2017-02-08 ENCOUNTER — Encounter: Payer: Self-pay | Admitting: Family Medicine

## 2017-02-08 ENCOUNTER — Ambulatory Visit (INDEPENDENT_AMBULATORY_CARE_PROVIDER_SITE_OTHER): Payer: Medicare Other | Admitting: Family Medicine

## 2017-02-08 VITALS — BP 120/62 | HR 52 | Temp 97.7°F | Ht 69.0 in | Wt 191.6 lb

## 2017-02-08 DIAGNOSIS — I10 Essential (primary) hypertension: Secondary | ICD-10-CM | POA: Diagnosis not present

## 2017-02-08 DIAGNOSIS — N184 Chronic kidney disease, stage 4 (severe): Secondary | ICD-10-CM

## 2017-02-08 DIAGNOSIS — E785 Hyperlipidemia, unspecified: Secondary | ICD-10-CM | POA: Diagnosis not present

## 2017-02-08 DIAGNOSIS — E1121 Type 2 diabetes mellitus with diabetic nephropathy: Secondary | ICD-10-CM | POA: Diagnosis not present

## 2017-02-08 DIAGNOSIS — Z0000 Encounter for general adult medical examination without abnormal findings: Secondary | ICD-10-CM | POA: Diagnosis not present

## 2017-02-08 LAB — CBC WITH DIFFERENTIAL/PLATELET
Basophils Absolute: 0.1 10*3/uL (ref 0.0–0.1)
Basophils Relative: 0.7 % (ref 0.0–3.0)
Eosinophils Absolute: 0.2 10*3/uL (ref 0.0–0.7)
Eosinophils Relative: 2.4 % (ref 0.0–5.0)
HCT: 43.5 % (ref 39.0–52.0)
Hemoglobin: 14.9 g/dL (ref 13.0–17.0)
LYMPHS ABS: 1.7 10*3/uL (ref 0.7–4.0)
Lymphocytes Relative: 18.2 % (ref 12.0–46.0)
MCHC: 34.1 g/dL (ref 30.0–36.0)
MCV: 91.1 fl (ref 78.0–100.0)
MONO ABS: 0.7 10*3/uL (ref 0.1–1.0)
MONOS PCT: 7.1 % (ref 3.0–12.0)
NEUTROS ABS: 6.8 10*3/uL (ref 1.4–7.7)
NEUTROS PCT: 71.6 % (ref 43.0–77.0)
PLATELETS: 156 10*3/uL (ref 150.0–400.0)
RBC: 4.78 Mil/uL (ref 4.22–5.81)
RDW: 13.5 % (ref 11.5–15.5)
WBC: 9.4 10*3/uL (ref 4.0–10.5)

## 2017-02-08 LAB — LIPID PANEL
CHOLESTEROL: 110 mg/dL (ref 0–200)
HDL: 34.7 mg/dL — ABNORMAL LOW (ref >39.00)
LDL Cholesterol: 39 mg/dL (ref 0–99)
NonHDL: 75.69
Total CHOL/HDL Ratio: 3
Triglycerides: 184 mg/dL — ABNORMAL HIGH (ref 0.0–149.0)
VLDL: 36.8 mg/dL (ref 0.0–40.0)

## 2017-02-08 LAB — BASIC METABOLIC PANEL
BUN: 38 mg/dL — AB (ref 6–23)
CO2: 27 mEq/L (ref 19–32)
CREATININE: 2.18 mg/dL — AB (ref 0.40–1.50)
Calcium: 10.1 mg/dL (ref 8.4–10.5)
Chloride: 104 mEq/L (ref 96–112)
GFR: 30.94 mL/min — AB (ref >60.00)
Glucose, Bld: 118 mg/dL — ABNORMAL HIGH (ref 70–99)
Potassium: 4.6 mEq/L (ref 3.5–5.1)
Sodium: 137 mEq/L (ref 135–145)

## 2017-02-08 LAB — TSH: TSH: 4.95 u[IU]/mL — ABNORMAL HIGH (ref 0.35–4.50)

## 2017-02-08 LAB — HEPATIC FUNCTION PANEL
ALBUMIN: 4.8 g/dL (ref 3.5–5.2)
ALK PHOS: 80 U/L (ref 39–117)
ALT: 16 U/L (ref 0–53)
AST: 11 U/L (ref 0–37)
Bilirubin, Direct: 0.2 mg/dL (ref 0.0–0.3)
Total Bilirubin: 1.3 mg/dL — ABNORMAL HIGH (ref 0.2–1.2)
Total Protein: 8 g/dL (ref 6.0–8.3)

## 2017-02-08 LAB — HEMOGLOBIN A1C: HEMOGLOBIN A1C: 6.6 % — AB (ref 4.6–6.5)

## 2017-02-08 NOTE — Patient Instructions (Signed)
Set up diabetic eye exam Try to taper slowly off Alprazolam- as discussed  Take one half daily for 2 weeks and then 1/4 daily for 2 weeks and then discontinue.

## 2017-02-08 NOTE — Progress Notes (Signed)
Subjective:     Patient ID: [name], male   DOB: [birth year], 83 y.o.   MRN: [id number]  HPI Patient seen for physical exam. He has complicated past medical history including history of atrial fibrillation, CAD, type 2 diabetes, hypertension, dyslipidemia, chronic kidney disease, ischemic cardiomyopathy, history of prostate cancer, urinary urgency, and rosacea. Does not monitor blood sugars regularly. Last A1c was several months ago. He has gotten eye exams in the past the [place] [facility] and needs to schedule follow-up soon. He has occasional chest pains which are sometimes relieved with nitroglycerin. He does not have any pending follow-up with cardiology at this time. Medications reviewed. Compliant with all. Immunizations up-to-date  Past Medical History:  Diagnosis Date  . Arthritis   . CAD (coronary artery disease)    a. Cath September 2015 LIMA to the LAD patent, SVG to PDA patent, SVG to posterior lateral patent, SVG to OM with a 90% in-stent restenosis at an anastomotic lesion. This was treated with angioplasty. b. cath 04/01/2015 95% ISR in SVG to OM treated with 2.75x24 Synergy DES postdilated to 3.68mm, all other grafts patent  . Cataract    surgery,B/L  . CHF (congestive heart failure) ([place])   . Chronic kidney disease    nephrolithiasis  . Diabetes mellitus    TYPE 2  . GERD (gastroesophageal reflux disease)   . Heart murmur   . Hernia   . Hypercholesterolemia   . Hypertension   . Incontinence    hx  over 1 year,leaks without awareness  . Other and unspecified diseases of appendix   . PAF (paroxysmal atrial fibrillation) ([place])    in the setting of ischemia on 03/31/2015, holding off on starting coumadin after received DAPT, will monitor for recurrence with 30 day event monitor  . Pancreatitis   . Prostate CA ([place]) 03/01/04   prostate bx=Adenocarcinoam,gleason 3+4=7,PSA=6.75  . Shortness of breath dyspnea   . Ulcer    hx gastric   Past Surgical History:  Procedure  Laterality Date  . APPENDECTOMY    . BACK SURGERY    . CARDIAC CATHETERIZATION N/A 04/01/2015   Procedure: Left Heart Cath and Cors/Grafts Angiography;  Surgeon: Jettie Booze, MD;  Location: [place] [facility];  Service: Cardiovascular;  Laterality: N/A;  . CARDIAC CATHETERIZATION N/A 04/01/2015   Procedure: Coronary Stent Intervention;  Surgeon: Jettie Booze, MD;  Location: [place] [facility];  Service: Cardiovascular;  Laterality: N/A;  . CARPAL TUNNEL RELEASE  2004   both hands  . CORONARY ARTERY BYPASS GRAFT    . CYSTOSCOPY  08/23/12   incomplete emoptying bladder  . HERNIA REPAIR    . incision and drainage of right chest abscess    . LAPAROSCOPIC CHOLECYSTECTOMY  09/2009  . LEFT HEART CATHETERIZATION WITH CORONARY ANGIOGRAM N/A 07/19/2014   Procedure: LEFT HEART CATHETERIZATION WITH CORONARY ANGIOGRAM;  Surgeon: Leonie Man, MD;  Location: [facility] CATH LAB;  Service: Cardiovascular;  Laterality: N/A;  . RECTAL SURGERY    . ROBOT ASSISTED LAPAROSCOPIC RADICAL PROSTATECTOMY  2005    reports that he quit smoking about 44 years ago. His smoking use included Cigarettes. He has a 2.50 pack-year smoking history. He has never used smokeless tobacco. He reports that he does not drink alcohol or use drugs. family history includes Cancer in his mother. Allergies  Allergen Reactions  . Codeine   . Morphine     REACTION: does not like     Review of Systems  Constitutional:  Negative for activity change, appetite change, fatigue and fever.  HENT: Negative for congestion, ear pain and trouble swallowing.   Eyes: Negative for pain and visual disturbance.  Respiratory: Negative for cough, shortness of breath and wheezing.   Cardiovascular: Negative for palpitations and leg swelling.  Gastrointestinal: Negative for abdominal distention, abdominal pain, blood in stool, constipation, diarrhea, nausea, rectal pain and vomiting.  Endocrine: Negative for polydipsia and polyuria.   Genitourinary: Negative for dysuria, hematuria and testicular pain.  Musculoskeletal: Positive for arthralgias. Negative for joint swelling.  Skin: Negative for rash.  Neurological: Negative for dizziness, syncope and headaches.  Hematological: Negative for adenopathy.  Psychiatric/Behavioral: Negative for confusion and dysphoric mood.       Objective:   Physical Exam  Constitutional: He is oriented to person, place, and time. He appears well-developed and well-nourished. No distress.  HENT:  Head: Normocephalic and atraumatic.  Right Ear: External ear normal.  Left Ear: External ear normal.  Mouth/Throat: Oropharynx is clear and moist. No oropharyngeal exudate.  Eyes: Conjunctivae and EOM are normal. Pupils are equal, round, and reactive to light.  Neck: Normal range of motion. Neck supple. No thyromegaly present.  Cardiovascular: Normal rate and normal heart sounds.   Pulmonary/Chest: No respiratory distress. He has no wheezes. He has no rales.  Abdominal: Soft. Bowel sounds are normal. He exhibits no distension and no mass. There is no tenderness. There is no rebound and no guarding.  Musculoskeletal: He exhibits no edema.  Lymphadenopathy:    He has no cervical adenopathy.  Neurological: He is alert and oriented to person, place, and time. He displays normal reflexes. No cranial nerve deficit.  Skin: No rash noted.  Psychiatric: He has a normal mood and affect.       Assessment:     #1 Physical exam. Patient is encouraged to continue with yearly flu vaccine. Other immunizations up-to-date  #2 history of CAD. He is encouraged to follow-up closely with cardiology  #3 type 2 diabetes. Generally well-controlled in the past  #3 dyslipidemia  #4 hypertension stable and at goal    Plan:     -Obtain labs with CBC, basic metabolic panel, hepatic panel, lipid panel, hemoglobin A1c, TSH -Continue with current medications -He is strongly encouraged to schedule follow-up with  cardiology soon -Continue with yearly flu vaccination  [name] MD [facility] at [facility]

## 2017-03-09 ENCOUNTER — Other Ambulatory Visit: Payer: Self-pay | Admitting: Family Medicine

## 2017-03-28 ENCOUNTER — Encounter (HOSPITAL_COMMUNITY): Payer: Medicare Other

## 2017-03-28 ENCOUNTER — Ambulatory Visit (HOSPITAL_COMMUNITY)
Admission: RE | Admit: 2017-03-28 | Discharge: 2017-03-28 | Disposition: A | Discharge status: Home / Self Care | Payer: Medicare Other | Source: Ambulatory Visit | Attending: Cardiology | Admitting: Cardiology

## 2017-03-28 DIAGNOSIS — I6501 Occlusion and stenosis of right vertebral artery: Secondary | ICD-10-CM | POA: Diagnosis not present

## 2017-03-28 DIAGNOSIS — I6523 Occlusion and stenosis of bilateral carotid arteries: Secondary | ICD-10-CM

## 2017-03-28 DIAGNOSIS — I2510 Atherosclerotic heart disease of native coronary artery without angina pectoris: Secondary | ICD-10-CM | POA: Diagnosis not present

## 2017-03-28 DIAGNOSIS — I10 Essential (primary) hypertension: Secondary | ICD-10-CM | POA: Insufficient documentation

## 2017-03-28 DIAGNOSIS — E785 Hyperlipidemia, unspecified: Secondary | ICD-10-CM | POA: Diagnosis not present

## 2017-03-28 DIAGNOSIS — E119 Type 2 diabetes mellitus without complications: Secondary | ICD-10-CM | POA: Insufficient documentation

## 2017-03-28 DIAGNOSIS — Z951 Presence of aortocoronary bypass graft: Secondary | ICD-10-CM | POA: Diagnosis not present

## 2017-04-05 ENCOUNTER — Other Ambulatory Visit: Payer: Self-pay | Admitting: Family Medicine

## 2017-04-16 ENCOUNTER — Other Ambulatory Visit: Payer: Self-pay | Admitting: Cardiology

## 2017-04-22 ENCOUNTER — Other Ambulatory Visit: Payer: Self-pay

## 2017-04-22 MED ORDER — CLOPIDOGREL BISULFATE 75 MG PO TABS: ORAL_TABLET | ORAL | 3 refills | Status: DC

## 2017-05-11 ENCOUNTER — Ambulatory Visit: Payer: Self-pay | Admitting: Family Medicine

## 2017-05-17 ENCOUNTER — Encounter: Payer: Self-pay | Admitting: Family Medicine

## 2017-05-17 ENCOUNTER — Ambulatory Visit (INDEPENDENT_AMBULATORY_CARE_PROVIDER_SITE_OTHER): Payer: Medicare Other | Admitting: Family Medicine

## 2017-05-17 VITALS — BP 130/68 | HR 57 | Temp 98.1°F | Wt 186.2 lb

## 2017-05-17 DIAGNOSIS — N184 Chronic kidney disease, stage 4 (severe): Secondary | ICD-10-CM | POA: Diagnosis not present

## 2017-05-17 DIAGNOSIS — I10 Essential (primary) hypertension: Secondary | ICD-10-CM | POA: Diagnosis not present

## 2017-05-17 DIAGNOSIS — E1121 Type 2 diabetes mellitus with diabetic nephropathy: Secondary | ICD-10-CM

## 2017-05-17 LAB — POCT GLYCOSYLATED HEMOGLOBIN (HGB A1C): Hemoglobin A1C: 6.5

## 2017-05-17 NOTE — Progress Notes (Signed)
Subjective:     Patient ID: [name], male   DOB: 1933/02/22, 83 y.o.   MRN: [id number]  HPI Patient is seen for medical follow-up.  Generally feels well since last follow-up. He's lost about 5 pounds but states his appetite is about the same. No abdominal pain. No diarrhea. No vomiting.  Type 2 diabetes. Recent good control. No hypoglycemia. Sometimes has frequency at night but otherwise no polyuria. A1c today 6.5%  Chronic kidney disease. Recent creatinine slightly elevated 2.18. He states he had occasional "cloudy urine ". He saw his urologist recently and apparently reports he had some bacteria in his urine and was treated briefly with antibiotic. Denies any consistent burning with urination. No fevers or chills.  History of CAD. No recent chest pains. He has history of ischemic cardiomyopathy. No recent peripheral edema or orthopnea.  Past Medical History:  Diagnosis Date  . Arthritis   . CAD (coronary artery disease)    a. Cath September 2015 LIMA to the LAD patent, SVG to PDA patent, SVG to posterior lateral patent, SVG to OM with a 90% in-stent restenosis at an anastomotic lesion. This was treated with angioplasty. b. cath 04/01/2015 95% ISR in SVG to OM treated with 2.75x24 Synergy DES postdilated to 3.13mm, all other grafts patent  . Cataract    surgery,B/L  . CHF (congestive heart failure) ([place])   . Chronic kidney disease    nephrolithiasis  . Diabetes mellitus    TYPE 2  . GERD (gastroesophageal reflux disease)   . Heart murmur   . Hernia   . Hypercholesterolemia   . Hypertension   . Incontinence    hx  over 1 year,leaks without awareness  . Other and unspecified diseases of appendix   . PAF (paroxysmal atrial fibrillation) ([place])    in the setting of ischemia on 03/31/2015, holding off on starting coumadin after received DAPT, will monitor for recurrence with 30 day event monitor  . Pancreatitis   . Prostate CA ([place]) 03/01/04   prostate bx=Adenocarcinoam,gleason  3+4=7,PSA=6.75  . Shortness of breath dyspnea   . Ulcer    hx gastric   Past Surgical History:  Procedure Laterality Date  . APPENDECTOMY    . BACK SURGERY    . CARDIAC CATHETERIZATION N/A 04/01/2015   Procedure: Left Heart Cath and Cors/Grafts Angiography;  Surgeon: Jettie Booze, MD;  Location: [place] [facility];  Service: Cardiovascular;  Laterality: N/A;  . CARDIAC CATHETERIZATION N/A 04/01/2015   Procedure: Coronary Stent Intervention;  Surgeon: Jettie Booze, MD;  Location: [place] [facility];  Service: Cardiovascular;  Laterality: N/A;  . CARPAL TUNNEL RELEASE  2004   both hands  . CORONARY ARTERY BYPASS GRAFT    . CYSTOSCOPY  08/23/12   incomplete emoptying bladder  . HERNIA REPAIR    . incision and drainage of right chest abscess    . LAPAROSCOPIC CHOLECYSTECTOMY  09/2009  . LEFT HEART CATHETERIZATION WITH CORONARY ANGIOGRAM N/A 07/19/2014   Procedure: LEFT HEART CATHETERIZATION WITH CORONARY ANGIOGRAM;  Surgeon: Leonie Man, MD;  Location: [facility] CATH LAB;  Service: Cardiovascular;  Laterality: N/A;  . RECTAL SURGERY    . ROBOT ASSISTED LAPAROSCOPIC RADICAL PROSTATECTOMY  2005    reports that he quit smoking about 44 years ago. His smoking use included Cigarettes. He has a 2.50 pack-year smoking history. He has never used smokeless tobacco. He reports that he does not drink alcohol or use drugs. family history includes Cancer in his mother. Allergies  Allergen Reactions  . Codeine   . Morphine     REACTION: does not like     Review of Systems  Constitutional: Negative for fatigue.  Eyes: Negative for visual disturbance.  Respiratory: Negative for cough, chest tightness and shortness of breath.   Cardiovascular: Negative for chest pain, palpitations and leg swelling.  Gastrointestinal: Negative for abdominal pain.  Endocrine: Negative for polydipsia and polyuria.  Neurological: Negative for dizziness, syncope, weakness, light-headedness and headaches.        Objective:   Physical Exam  Constitutional: He is oriented to person, place, and time. He appears well-developed and well-nourished.  HENT:  Right Ear: External ear normal.  Left Ear: External ear normal.  Mouth/Throat: Oropharynx is clear and moist.  Eyes: Pupils are equal, round, and reactive to light.  Neck: Neck supple. No thyromegaly present.  Cardiovascular: Normal rate and regular rhythm.   Pulmonary/Chest: Effort normal and breath sounds normal. No respiratory distress. He has no wheezes. He has no rales.  Musculoskeletal: He exhibits no edema.  Neurological: He is alert and oriented to person, place, and time.       Assessment:     #1 type 2 diabetes. Well controlled with A1c today 6.5%  #2 hypertension stable  #3 history of CAD  #4 chronic kidney disease with recent bump in creatinine of 2.18    Plan:     -Continue current medications -Recheck basic metabolic panel -Avoid all non-steroidals -Routine follow-up in 4 months and sooner as needed  [name] MD [facility] at [facility]

## 2017-07-17 ENCOUNTER — Other Ambulatory Visit: Payer: Self-pay | Admitting: Family Medicine

## 2017-07-21 ENCOUNTER — Ambulatory Visit: Payer: Medicare Other | Admitting: Cardiology

## 2017-07-21 NOTE — Progress Notes (Signed)
HPI The patient presents for followup of CAD.   He had chest pain and had a cardiac catheterization on 04/01/2015 which showed a 95% in-stent restenosis in SVG to OM, treated with 2.75 x 24 mm Synergy DES postdilated to 3.3 mm. He had patent LIMA to LAD, patent SVG to posterolateral, patent SVG to PDA.    Of note he did have atrial fibrillation with rate control on presentation but he converted spontaneously. He was not started on warfarin because he was taking aspirin and Plavix.  He has had no symptomatic recurrence of this arrhythmia.     Since I last saw him he's had some rare shooting chest pain. He does get palpitations that he thinks might be his atrial fibrillation. It happens mostly at night. He feels an irregular heart rate but has not had any presyncope or syncope. He denies any chest pressure, neck or arm discomfort. He's had no new shortness of breath, PND or orthopnea. He's had no weight gain or edema.   Allergies  Allergen Reactions  . Codeine   . Morphine     REACTION: does not like    Current Outpatient Prescriptions  Medication Sig Dispense Refill  . Albuterol Sulfate (PROAIR RESPICLICK) 035 (90 Base) MCG/ACT AEPB Inhale 2 puffs into the lungs every 4 (four) hours. 1 each 0  . amLODipine (NORVASC) 10 MG tablet TAKE 1 TABLET BY MOUTH DAILY 90 tablet 1  . carvedilol (COREG) 12.5 MG tablet TAKE 1 TABLET BY MOUTH TWICE A DAY WITH A MEAL 180 tablet 0  . clopidogrel (PLAVIX) 75 MG tablet TAKE 1 TABLET (75 MG TOTAL) BY MOUTH DAILY. 30 tablet 3  . furosemide (LASIX) 40 MG tablet Take 1/2 tablet daily. 90 tablet 2  . glucose blood (FREESTYLE LITE) test strip 1 each by Other route as needed. Use as instructed     . JANUVIA 50 MG tablet TAKE 1 TABLET BY MOUTH EVERY DAY 90 tablet 2  . Lancets (FREESTYLE) lancets 1 each by Other route as needed. Use as instructed     . NITROSTAT 0.4 MG SL tablet USE AS DIRECTED 25 tablet 3  . pantoprazole (PROTONIX) 40 MG tablet TAKE 1 TABLET (40 MG  TOTAL) BY MOUTH DAILY. 90 tablet 3  . rosuvastatin (CRESTOR) 20 MG tablet TAKE 1 TABLET (20 MG TOTAL) BY MOUTH DAILY. 90 tablet 2  . solifenacin (VESICARE) 5 MG tablet Take 1 tablet (5 mg total) by mouth daily. 90 tablet 1  . spironolactone (ALDACTONE) 25 MG tablet TAKE 1/2 TABLET BY MOUTH TWICE A DAY 90 tablet 3  . traMADol (ULTRAM) 50 MG tablet Take 1 tablet (50 mg total) by mouth every 6 (six) hours as needed. 1-2 every 6 h prn pain 120 tablet 5   No current facility-administered medications for this visit.     Past Medical History:  Diagnosis Date  . Arthritis   . CAD (coronary artery disease)    a. Cath September 2015 LIMA to the LAD patent, SVG to PDA patent, SVG to posterior lateral patent, SVG to OM with a 90% in-stent restenosis at an anastomotic lesion. This was treated with angioplasty. b. cath 04/01/2015 95% ISR in SVG to OM treated with 2.75x24 Synergy DES postdilated to 3.8mm, all other grafts patent  . Cataract    surgery,B/L  . CHF (congestive heart failure) ([place])   . Chronic kidney disease    nephrolithiasis  . Diabetes mellitus    TYPE 2  . GERD (gastroesophageal reflux disease)   .  Heart murmur   . Hernia   . Hypercholesterolemia   . Hypertension   . Incontinence    hx  over 1 year,leaks without awareness  . Other and unspecified diseases of appendix   . PAF (paroxysmal atrial fibrillation) ([place])    in the setting of ischemia on 03/31/2015, holding off on starting coumadin after received DAPT, will monitor for recurrence with 30 day event monitor  . Pancreatitis   . Prostate CA ([place]) 03/01/04   prostate bx=Adenocarcinoam,gleason 3+4=7,PSA=6.75  . Shortness of breath dyspnea   . Ulcer    hx gastric    Past Surgical History:  Procedure Laterality Date  . APPENDECTOMY    . BACK SURGERY    . CARDIAC CATHETERIZATION N/A 04/01/2015   Procedure: Left Heart Cath and Cors/Grafts Angiography;  Surgeon: Jettie Booze, MD;  Location: [place] [facility];  Service:  Cardiovascular;  Laterality: N/A;  . CARDIAC CATHETERIZATION N/A 04/01/2015   Procedure: Coronary Stent Intervention;  Surgeon: Jettie Booze, MD;  Location: [place] [facility];  Service: Cardiovascular;  Laterality: N/A;  . CARPAL TUNNEL RELEASE  2004   both hands  . CORONARY ARTERY BYPASS GRAFT    . CYSTOSCOPY  08/23/12   incomplete emoptying bladder  . HERNIA REPAIR    . incision and drainage of right chest abscess    . LAPAROSCOPIC CHOLECYSTECTOMY  09/2009  . LEFT HEART CATHETERIZATION WITH CORONARY ANGIOGRAM N/A 07/19/2014   Procedure: LEFT HEART CATHETERIZATION WITH CORONARY ANGIOGRAM;  Surgeon: Leonie Man, MD;  Location: [facility] CATH LAB;  Service: Cardiovascular;  Laterality: N/A;  . RECTAL SURGERY    . ROBOT ASSISTED LAPAROSCOPIC RADICAL PROSTATECTOMY  2005    ROS:   As stated in the HPI and negative for all other systems.   PHYSICAL EXAM BP (!) 130/50   Pulse (!) 46   Ht 5\' 9"  (1.753 m)   Wt 183 lb 3.2 oz (83.1 kg)   BMI 27.05 kg/m   GENERAL:  Well appearing NECK:  No jugular venous distention, waveform within normal limits, carotid upstroke brisk and symmetric, no bruits, no thyromegaly LUNGS:  Clear to auscultation bilaterally CHEST:  Well healed sternotomy scar. HEART:  PMI not displaced or sustained,S1 and S2 within normal limits, no S3, no S4, no clicks, no rubs, apical systolic early peaking murmur radiating slightly to the axilla, no diastolic murmurs ABD:  Flat, positive bowel sounds normal in frequency in pitch, no bruits, no rebound, no guarding, no midline pulsatile mass, no hepatomegaly, no splenomegaly EXT:  2 plus pulses throughout, no edema, no cyanosis no clubbing   EKG:  Sinus bradycardia, rate 46,  LBBB.  07/23/2017  Lab Results  Component Value Date   CHOL 110 02/08/2017   TRIG 184.0 (H) 02/08/2017   HDL 34.70 (L) 02/08/2017   LDLCALC 39 02/08/2017   LDLDIRECT 143.3 11/23/2007   Lab Results  Component Value Date   HGBA1C 6.5 05/17/2017    Lab Results  Component Value Date   CREATININE 2.18 (H) 02/08/2017   Lab Results  Component Value Date   HGBA1C 6.5 05/17/2017     ASSESSMENT AND PLAN   CORONARY ARTERY DISEASE  The patient has no new sypmtoms.  No further cardiovascular testing is indicated.  We will continue with aggressive risk reduction and meds as listed.  Atrial fib In order to understand whether he is having atrial fibrillation and whether he needs anticoagulation he will get an event monitor. Isidore Moos Ander will need  a 21 day event monitor.  The patients symptoms necessitate an event monitor.  The symptoms are too infrequent to be identified on a Holter monitor.    Hypertension  The blood pressure is well controlled.  He will continue with meds as listed.   Murmur:   He has mild aortic stenosis/assistant 2015 no further imaging is planned.  No change in therapy or further imaging is indicated.   Carotid stenosis-Bilateral  The patient now has bilateral 40 - 59% stenosis bilateral in June.    Bradycardia This has been evaluated in the past and there were no sustained or symptomatic bradycardia arrhythmias.    CKD: His creatinine was 2.18 in April.  No change in therapy.

## 2017-07-22 ENCOUNTER — Encounter: Payer: Self-pay | Admitting: Cardiology

## 2017-07-22 ENCOUNTER — Ambulatory Visit (INDEPENDENT_AMBULATORY_CARE_PROVIDER_SITE_OTHER): Payer: Medicare Other | Admitting: Cardiology

## 2017-07-22 VITALS — BP 130/50 | HR 46 | Ht 69.0 in | Wt 183.2 lb

## 2017-07-22 DIAGNOSIS — I10 Essential (primary) hypertension: Secondary | ICD-10-CM | POA: Diagnosis not present

## 2017-07-22 DIAGNOSIS — I480 Paroxysmal atrial fibrillation: Secondary | ICD-10-CM | POA: Diagnosis not present

## 2017-07-22 DIAGNOSIS — I6523 Occlusion and stenosis of bilateral carotid arteries: Secondary | ICD-10-CM

## 2017-07-22 DIAGNOSIS — R002 Palpitations: Secondary | ICD-10-CM

## 2017-07-22 NOTE — Patient Instructions (Signed)
Medication Instructions:  Continue current medications  If you need a refill on your cardiac medications before your next appointment, please call your pharmacy.  Labwork: None Ordered   Testing/Procedures: Your physician has recommended that you wear an event monitor for 30 days. Event monitors are medical devices that record the heart's electrical activity. Doctors most often [place] these monitors to diagnose arrhythmias. Arrhythmias are problems with the speed or rhythm of the heartbeat. The monitor is a small, portable device. You can wear one while you do your normal daily activities. This is usually used to diagnose what is causing palpitations/syncope (passing out).   Follow-Up: Your physician wants you to follow-up in: 2 Months.    Thank you for choosing [facility] at [facility]!!

## 2017-07-23 ENCOUNTER — Encounter: Payer: Self-pay | Admitting: Cardiology

## 2017-07-23 DIAGNOSIS — I6523 Occlusion and stenosis of bilateral carotid arteries: Secondary | ICD-10-CM | POA: Insufficient documentation

## 2017-07-23 DIAGNOSIS — R002 Palpitations: Secondary | ICD-10-CM | POA: Insufficient documentation

## 2017-07-27 ENCOUNTER — Other Ambulatory Visit: Payer: Self-pay | Admitting: Cardiology

## 2017-07-28 ENCOUNTER — Encounter (INDEPENDENT_AMBULATORY_CARE_PROVIDER_SITE_OTHER): Payer: Medicare Other

## 2017-07-28 DIAGNOSIS — R002 Palpitations: Secondary | ICD-10-CM

## 2017-07-31 ENCOUNTER — Other Ambulatory Visit: Payer: Self-pay | Admitting: Cardiology

## 2017-08-01 NOTE — Telephone Encounter (Signed)
Rx(s) sent to pharmacy electronically.  

## 2017-08-02 ENCOUNTER — Other Ambulatory Visit: Payer: Self-pay | Admitting: *Deleted

## 2017-08-02 MED ORDER — CLOPIDOGREL BISULFATE 75 MG PO TABS: ORAL_TABLET | ORAL | 3 refills | Status: DC

## 2017-08-02 NOTE — Telephone Encounter (Signed)
Pharmacy requests ninety day rx. 

## 2017-08-08 ENCOUNTER — Ambulatory Visit: Payer: Medicare Other | Admitting: Cardiology

## 2017-08-29 ENCOUNTER — Other Ambulatory Visit: Payer: Self-pay | Admitting: Family Medicine

## 2017-08-29 ENCOUNTER — Other Ambulatory Visit: Payer: Self-pay | Admitting: Cardiology

## 2017-08-29 NOTE — Telephone Encounter (Signed)
REFILL 

## 2017-09-13 ENCOUNTER — Ambulatory Visit: Payer: Medicare Other | Admitting: Family Medicine

## 2017-09-13 ENCOUNTER — Encounter: Payer: Self-pay | Admitting: Family Medicine

## 2017-09-13 VITALS — BP 120/60 | HR 57 | Temp 98.1°F | Wt 188.9 lb

## 2017-09-13 DIAGNOSIS — R0609 Other forms of dyspnea: Secondary | ICD-10-CM

## 2017-09-13 DIAGNOSIS — N184 Chronic kidney disease, stage 4 (severe): Secondary | ICD-10-CM

## 2017-09-13 DIAGNOSIS — E1121 Type 2 diabetes mellitus with diabetic nephropathy: Secondary | ICD-10-CM

## 2017-09-13 DIAGNOSIS — R3915 Urgency of urination: Secondary | ICD-10-CM | POA: Diagnosis not present

## 2017-09-13 DIAGNOSIS — E785 Hyperlipidemia, unspecified: Secondary | ICD-10-CM | POA: Diagnosis not present

## 2017-09-13 LAB — BASIC METABOLIC PANEL
BUN: 26 mg/dL — AB (ref 6–23)
CALCIUM: 10 mg/dL (ref 8.4–10.5)
CHLORIDE: 104 meq/L (ref 96–112)
CO2: 24 meq/L (ref 19–32)
CREATININE: 1.73 mg/dL — AB (ref 0.40–1.50)
GFR: 40.34 mL/min — ABNORMAL LOW (ref >60.00)
Glucose, Bld: 152 mg/dL — ABNORMAL HIGH (ref 70–99)
Potassium: 4 mEq/L (ref 3.5–5.1)
Sodium: 138 mEq/L (ref 135–145)

## 2017-09-13 LAB — POCT GLYCOSYLATED HEMOGLOBIN (HGB A1C): Hemoglobin A1C: 6.2

## 2017-09-13 NOTE — Progress Notes (Signed)
Subjective:     Patient ID: [name], male   DOB: 1933-04-17, 83 y.o.   MRN: [id number]  HPI Patient seen for medical follow-up. He has history of CAD, peripheral*disease, hypertension, hyperlipidemia, GERD, type 2 diabetes, chronic kidney disease stage IV.   Medications reviewed. Does not monitor blood sugars regularly. A1c today 6.2%. Takes low-dose Januvia 50 mg.  Patient's had some exertional dyspnea for several months. He has follow up with cardiology in a week. Denies orthopnea. He went to [place] at [facility] and they place him on some type of new inhaler which did not seem to help and in fact increased his coughing. He cannot recall the name.  Denies any consistent associated chest pain   Had cardiac cath slit 04/01/15 with reoccluded right coronary artery-which was redilated  Past Medical History:  Diagnosis Date  . Arthritis   . CAD (coronary artery disease)    a. Cath September 2015 LIMA to the LAD patent, SVG to PDA patent, SVG to posterior lateral patent, SVG to OM with a 90% in-stent restenosis at an anastomotic lesion. This was treated with angioplasty. b. cath 04/01/2015 95% ISR in SVG to OM treated with 2.75x24 Synergy DES postdilated to 3.103mm, all other grafts patent  . Cataract    surgery,B/L  . CHF (congestive heart failure) ([place])   . Chronic kidney disease    nephrolithiasis  . Diabetes mellitus    TYPE 2  . GERD (gastroesophageal reflux disease)   . Heart murmur   . Hernia   . Hypercholesterolemia   . Hypertension   . Incontinence    hx  over 1 year,leaks without awareness  . Other and unspecified diseases of appendix   . PAF (paroxysmal atrial fibrillation) ([place])    in the setting of ischemia on 03/31/2015, holding off on starting coumadin after received DAPT, will monitor for recurrence with 30 day event monitor  . Pancreatitis   . Prostate CA ([place]) 03/01/04   prostate bx=Adenocarcinoam,gleason 3+4=7,PSA=6.75  . Shortness of breath dyspnea   . Ulcer    hx  gastric   Past Surgical History:  Procedure Laterality Date  . APPENDECTOMY    . BACK SURGERY    . CARDIAC CATHETERIZATION N/A 04/01/2015   Procedure: Left Heart Cath and Cors/Grafts Angiography;  Surgeon: Jettie Booze, MD;  Location: [place] [facility];  Service: Cardiovascular;  Laterality: N/A;  . CARDIAC CATHETERIZATION N/A 04/01/2015   Procedure: Coronary Stent Intervention;  Surgeon: Jettie Booze, MD;  Location: [place] [facility];  Service: Cardiovascular;  Laterality: N/A;  . CARPAL TUNNEL RELEASE  2004   both hands  . CORONARY ARTERY BYPASS GRAFT    . CYSTOSCOPY  08/23/12   incomplete emoptying bladder  . HERNIA REPAIR    . incision and drainage of right chest abscess    . LAPAROSCOPIC CHOLECYSTECTOMY  09/2009  . LEFT HEART CATHETERIZATION WITH CORONARY ANGIOGRAM N/A 07/19/2014   Procedure: LEFT HEART CATHETERIZATION WITH CORONARY ANGIOGRAM;  Surgeon: Leonie Man, MD;  Location: [facility] CATH LAB;  Service: Cardiovascular;  Laterality: N/A;  . RECTAL SURGERY    . ROBOT ASSISTED LAPAROSCOPIC RADICAL PROSTATECTOMY  2005    reports that he quit smoking about 45 years ago. His smoking use included cigarettes. He has a 2.50 pack-year smoking history. he has never used smokeless tobacco. He reports that he does not drink alcohol or use drugs. family history includes Cancer in his mother. Allergies  Allergen Reactions  . Codeine   .  Morphine     REACTION: does not like         Review of Systems  Constitutional: Negative for appetite change, fatigue and unexpected weight change.  Eyes: Negative for visual disturbance.  Respiratory: Positive for shortness of breath. Negative for cough and chest tightness.   Cardiovascular: Negative for chest pain, palpitations and leg swelling.  Neurological: Negative for dizziness, syncope, weakness, light-headedness and headaches.       Objective:   Physical Exam  Constitutional: He is oriented to person, place, and time. He  appears well-developed and well-nourished.  HENT:  Right Ear: External ear normal.  Left Ear: External ear normal.  Mouth/Throat: Oropharynx is clear and moist.  Eyes: Pupils are equal, round, and reactive to light.  Neck: Neck supple. No thyromegaly present.  Cardiovascular: Normal rate and regular rhythm.  Pulmonary/Chest: Effort normal and breath sounds normal. No respiratory distress. He has no wheezes. He has no rales.  Musculoskeletal: He exhibits no edema.  Neurological: He is alert and oriented to person, place, and time.       Assessment:      #1 type 2 diabetes controlled with A1c 6.2%  #2 hypertension stable and at goal  #3 chronic kidney disease stage IV  #4 CAD  #5 recent somewhat progressive exertional dyspnea. No evidence for volume overload    Plan:     -Recheck basic metabolic panel. If creatinine clearance less than 30 reduce Januvia to 25 mg daily -Continue follow-up with cardiology and we have recommended he mentioned to them his recent increased dyspnea with activity -Flu vaccine already given -Routine follow-up in 6 months and sooner as needed  [name] MD [facility] at [facility]

## 2017-09-16 ENCOUNTER — Other Ambulatory Visit: Payer: Self-pay | Admitting: Family Medicine

## 2017-09-22 NOTE — Progress Notes (Signed)
HPI The patient presents for followup of CAD.   He had chest pain and had a cardiac catheterization on 04/01/2015 which showed a 95% in-stent restenosis in SVG to OM, treated with 2.75 x 24 mm Synergy DES postdilated to 3.3 mm. He had patent LIMA to LAD, patent SVG to posterolateral, patent SVG to PDA.    Of note he did have atrial fibrillation with rate control on presentation but he converted spontaneously. He was not started on warfarin because he was taking aspirin and Plavix.  He has had no symptomatic recurrence of this arrhythmia.   He wore an event monitor and there was no evidence of atrial fib.    He returns for follow up.  He continues to have shortness of breath.  This is with mild activities.  It does not happen at rest and is not describing PND or orthopnea.  He is not having any palpitations, presyncope or syncope.  He does have chest discomfort.  This happens if he overexerts himself.  He does not take any nitroglycerin the last 2 days.  He does notice some palpitations.    Allergies  Allergen Reactions  . Codeine   . Morphine     REACTION: does not like    Current Outpatient Medications  Medication Sig Dispense Refill  . Albuterol Sulfate (PROAIR RESPICLICK) 250 (90 Base) MCG/ACT AEPB Inhale 2 puffs into the lungs every 4 (four) hours. 1 each 0  . amLODipine (NORVASC) 10 MG tablet TAKE 1 TABLET BY MOUTH DAILY 90 tablet 1  . carvedilol (COREG) 12.5 MG tablet TAKE 1 TABLET BY MOUTH TWICE A DAY WITH A MEAL 180 tablet 0  . clopidogrel (PLAVIX) 75 MG tablet TAKE 1 TABLET (75 MG TOTAL) BY MOUTH DAILY. 30 tablet 3  . furosemide (LASIX) 40 MG tablet Take 20 mg by mouth daily.    Marland Kitchen glucose blood (FREESTYLE LITE) test strip 1 each by Other route as needed. Use as instructed     . JANUVIA 50 MG tablet TAKE 1 TABLET BY MOUTH EVERY DAY 90 tablet 2  . Lancets (FREESTYLE) lancets 1 each by Other route as needed. Use as instructed     . NITROSTAT 0.4 MG SL tablet USE AS DIRECTED 25 tablet 3   . pantoprazole (PROTONIX) 40 MG tablet TAKE 1 TABLET (40 MG TOTAL) BY MOUTH DAILY. 90 tablet 1  . rosuvastatin (CRESTOR) 20 MG tablet TAKE 1 TABLET (20 MG TOTAL) BY MOUTH DAILY. 90 tablet 2  . spironolactone (ALDACTONE) 25 MG tablet Take 25 mg by mouth daily.    . traMADol (ULTRAM) 50 MG tablet Take 1 tablet (50 mg total) by mouth every 6 (six) hours as needed. 1-2 every 6 h prn pain 120 tablet 5  . VESICARE 5 MG tablet TAKE 1 TABLET (5 MG TOTAL) BY MOUTH DAILY. 90 tablet 1  . furosemide (LASIX) 40 MG tablet TAKE 1 TABLET BY MOUTH EVERY DAY (Patient taking differently: TAKE half TABLET BY MOUTH EVERY DAY) 90 tablet 2  . spironolactone (ALDACTONE) 25 MG tablet TAKE 1/2 TABLET BY MOUTH TWICE A DAY (Patient taking differently: TAKE 1 TABLET BY MOUTH daily) 90 tablet 2   No current facility-administered medications for this visit.     Past Medical History:  Diagnosis Date  . Arthritis   . CAD (coronary artery disease)    a. Cath September 2015 LIMA to the LAD patent, SVG to PDA patent, SVG to posterior lateral patent, SVG to OM with a 90% in-stent  restenosis at an anastomotic lesion. This was treated with angioplasty. b. cath 04/01/2015 95% ISR in SVG to OM treated with 2.75x24 Synergy DES postdilated to 3.41mm, all other grafts patent  . Cataract    surgery,B/L  . CHF (congestive heart failure) ([place])   . Chronic kidney disease    nephrolithiasis  . Diabetes mellitus    TYPE 2  . GERD (gastroesophageal reflux disease)   . Heart murmur   . Hernia   . Hypercholesterolemia   . Hypertension   . Incontinence    hx  over 1 year,leaks without awareness  . Other and unspecified diseases of appendix   . PAF (paroxysmal atrial fibrillation) ([place])    in the setting of ischemia on 03/31/2015  . Pancreatitis   . Prostate CA ([place]) 03/01/04   prostate bx=Adenocarcinoam,gleason 3+4=7,PSA=6.75  . Shortness of breath dyspnea   . Ulcer    hx gastric    Past Surgical History:  Procedure Laterality  Date  . APPENDECTOMY    . BACK SURGERY    . CARDIAC CATHETERIZATION N/A 04/01/2015   Procedure: Left Heart Cath and Cors/Grafts Angiography;  Surgeon: Jettie Booze, MD;  Location: [place] [facility];  Service: Cardiovascular;  Laterality: N/A;  . CARDIAC CATHETERIZATION N/A 04/01/2015   Procedure: Coronary Stent Intervention;  Surgeon: Jettie Booze, MD;  Location: [place] [facility];  Service: Cardiovascular;  Laterality: N/A;  . CARPAL TUNNEL RELEASE  2004   both hands  . CORONARY ARTERY BYPASS GRAFT    . CYSTOSCOPY  08/23/12   incomplete emoptying bladder  . HERNIA REPAIR    . incision and drainage of right chest abscess    . LAPAROSCOPIC CHOLECYSTECTOMY  09/2009  . LEFT HEART CATHETERIZATION WITH CORONARY ANGIOGRAM N/A 07/19/2014   Procedure: LEFT HEART CATHETERIZATION WITH CORONARY ANGIOGRAM;  Surgeon: Leonie Man, MD;  Location: [facility] CATH LAB;  Service: Cardiovascular;  Laterality: N/A;  . RECTAL SURGERY    . ROBOT ASSISTED LAPAROSCOPIC RADICAL PROSTATECTOMY  2005    ROS:  As stated in the HPI and negative for all other systems.   PHYSICAL EXAM BP 124/64   Pulse (!) 58   Ht 5\' 9"  (1.753 m)   Wt 189 lb 3.2 oz (85.8 kg)   BMI 27.94 kg/m   GENERAL:  Well appearing NECK:  No jugular venous distention, waveform within normal limits, carotid upstroke brisk and symmetric, no bruits, no thyromegaly LUNGS:  Clear to auscultation bilaterally CHEST:  Unremarkable HEART:  PMI not displaced or sustained,S1 and S2 within normal limits, no S3, no S4, no clicks, no rubs, 2 out of 6 apical systolic murmur radiating slightly at the aortic outflow tract into the axilla, no diastolic murmurs ABD:  Flat, positive bowel sounds normal in frequency in pitch, no bruits, no rebound, no guarding, no midline pulsatile mass, no hepatomegaly, no splenomegaly EXT:  2 plus pulses throughout, no edema, no cyanosis no clubbing   EKG:  NA  Lab Results  Component Value Date   CHOL 110  02/08/2017   TRIG 184.0 (H) 02/08/2017   HDL 34.70 (L) 02/08/2017   LDLCALC 39 02/08/2017   LDLDIRECT 143.3 11/23/2007   Lab Results  Component Value Date   HGBA1C 6.2 09/13/2017   Lab Results  Component Value Date   CREATININE 1.73 (H) 09/13/2017   Lab Results  Component Value Date   HGBA1C 6.2 09/13/2017     ASSESSMENT AND PLAN   CORONARY ARTERY DISEASE  Given his shortness  of breath screen him with stress testing.  He has left bundle branch block.  He will have a [employer].  BNP will be drawn as well.    Atrial fib He briefly had fib a couple of years ago in the hospital.  He has had no documented recurrence of this.  He has had no recurrence of this.  I am not planning DOAC.   Hypertension  The blood pressure is at target. No change in medications is indicated. We will continue with therapeutic lifestyle changes (TLC).  Murmur:   He had mild AS in the past.  I will have a low threshold for follow up echo.    Carotid stenosis-Bilateral  He has moderate disease and I will follow up Doppler's in June.   Bradycardia He has had no symptomatic bradycardia.    CKD: His creatinine was 1.73 last month.  This will be followed by [name], MD

## 2017-09-23 ENCOUNTER — Encounter: Payer: Self-pay | Admitting: Cardiology

## 2017-09-23 ENCOUNTER — Ambulatory Visit: Payer: Medicare Other | Admitting: Cardiology

## 2017-09-23 VITALS — BP 124/64 | HR 58 | Ht 69.0 in | Wt 189.2 lb

## 2017-09-23 DIAGNOSIS — R001 Bradycardia, unspecified: Secondary | ICD-10-CM

## 2017-09-23 DIAGNOSIS — I10 Essential (primary) hypertension: Secondary | ICD-10-CM

## 2017-09-23 DIAGNOSIS — I2510 Atherosclerotic heart disease of native coronary artery without angina pectoris: Secondary | ICD-10-CM | POA: Diagnosis not present

## 2017-09-23 DIAGNOSIS — R0602 Shortness of breath: Secondary | ICD-10-CM | POA: Diagnosis not present

## 2017-09-23 NOTE — Patient Instructions (Addendum)
Medication Instructions:  Continue current medications  If you need a refill on your cardiac medications before your next appointment, please call your pharmacy.  Labwork: BNP  Testing/Procedures: Your physician has requested that you have a lexiscan myoview. For further information please visit [email or web address]. Please follow instruction sheet, as given.    Follow-Up: Your physician wants you to follow-up in: 6 Months. You should receive a reminder letter in the mail two months in advance. If you do not receive a letter, please call our office [phone].    Thank you for choosing [facility] at [facility]!!

## 2017-09-28 ENCOUNTER — Ambulatory Visit (HOSPITAL_COMMUNITY)
Admission: RE | Admit: 2017-09-28 | Discharge: 2017-09-28 | Disposition: A | Discharge status: Home / Self Care | Payer: Medicare Other | Source: Ambulatory Visit | Attending: Cardiology | Admitting: Cardiology

## 2017-09-28 DIAGNOSIS — R0602 Shortness of breath: Secondary | ICD-10-CM | POA: Diagnosis not present

## 2017-09-28 LAB — MYOCARDIAL PERFUSION IMAGING
CHL CUP NUCLEAR SRS: 1
CHL CUP NUCLEAR SSS: 3
LV dias vol: 127 mL (ref 62–150)
LVSYSVOL: 69 mL
Peak HR: 65 {beats}/min
Rest HR: 54 {beats}/min
SDS: 2
TID: 1.03

## 2017-09-28 MED ORDER — TECHNETIUM TC 99M TETROFOSMIN IV KIT
30.8000 | PACK | Freq: Once | INTRAVENOUS | Status: DC | PRN
  Filled 2017-09-28: qty 31

## 2017-09-28 MED ORDER — TECHNETIUM TC 99M TETROFOSMIN IV KIT
9.9000 | PACK | Freq: Once | INTRAVENOUS | Status: DC | PRN
  Filled 2017-09-28: qty 10

## 2017-09-28 MED ORDER — TECHNETIUM TC 99M TETROFOSMIN IV KIT
9.9000 | PACK | Freq: Once | INTRAVENOUS | Status: AC | PRN
  Administered 2017-09-28: 9.9 via INTRAVENOUS
  Filled 2017-09-28: qty 10

## 2017-09-28 MED ORDER — REGADENOSON 0.4 MG/5ML IV SOLN
0.4000 mg | Freq: Once | INTRAVENOUS | Status: AC
  Administered 2017-09-28: 0.4 mg via INTRAVENOUS

## 2017-09-28 MED ORDER — REGADENOSON 0.4 MG/5ML IV SOLN: 0.4000 mg | Freq: Once | INTRAVENOUS | Status: DC

## 2017-09-28 MED ORDER — TECHNETIUM TC 99M TETROFOSMIN IV KIT
30.8000 | PACK | Freq: Once | INTRAVENOUS | Status: AC | PRN
  Administered 2017-09-28: 30.8 via INTRAVENOUS
  Filled 2017-09-28: qty 31

## 2017-09-29 ENCOUNTER — Other Ambulatory Visit: Payer: Self-pay | Admitting: Family Medicine

## 2017-09-29 LAB — BRAIN NATRIURETIC PEPTIDE: BNP: 189.4 pg/mL — AB (ref 0.0–100.0)

## 2017-10-03 ENCOUNTER — Telehealth: Payer: Self-pay | Admitting: *Deleted

## 2017-10-03 DIAGNOSIS — Z79899 Other long term (current) drug therapy: Secondary | ICD-10-CM

## 2017-10-03 NOTE — Telephone Encounter (Signed)
BMP ordered

## 2017-10-03 NOTE — Telephone Encounter (Signed)
-----   Message from [name], MD sent at 09/29/2017 10:33 PM EST ----- He has a fixed inferior defect.  The BNP was mildly increased.  Have him take Lasix 40 mg daily for 5 days and then back to 20 mg PO daily.  Check BMET in one week.  Call Mr. Bettendorf with the results and send results to [name], MD

## 2017-10-07 LAB — BASIC METABOLIC PANEL
BUN / CREAT RATIO: 15 (ref 10–24)
BUN: 28 mg/dL — AB (ref 8–27)
CO2: 21 mmol/L (ref 20–29)
CREATININE: 1.82 mg/dL — AB (ref 0.76–1.27)
Calcium: 9.4 mg/dL (ref 8.6–10.2)
Chloride: 102 mmol/L (ref 96–106)
GFR, EST AFRICAN AMERICAN: 39 mL/min/{1.73_m2} — AB (ref >59)
GFR, EST NON AFRICAN AMERICAN: 34 mL/min/{1.73_m2} — AB (ref >59)
Glucose: 150 mg/dL — ABNORMAL HIGH (ref 65–99)
Potassium: 4 mmol/L (ref 3.5–5.2)
Sodium: 139 mmol/L (ref 134–144)

## 2017-10-24 ENCOUNTER — Other Ambulatory Visit: Payer: Self-pay | Admitting: Cardiology

## 2017-11-22 ENCOUNTER — Other Ambulatory Visit: Payer: Self-pay | Admitting: *Deleted

## 2017-11-22 MED ORDER — ROSUVASTATIN CALCIUM 20 MG PO TABS: 20.0000 mg | ORAL_TABLET | Freq: Every day | ORAL | 0 refills | Status: DC

## 2017-12-26 ENCOUNTER — Other Ambulatory Visit: Payer: Self-pay | Admitting: *Deleted

## 2017-12-26 MED ORDER — AMLODIPINE BESYLATE 10 MG PO TABS: 10.0000 mg | ORAL_TABLET | Freq: Every day | ORAL | 0 refills | Status: DC

## 2017-12-29 ENCOUNTER — Other Ambulatory Visit: Payer: Self-pay | Admitting: Family Medicine

## 2018-01-16 ENCOUNTER — Other Ambulatory Visit: Payer: Self-pay | Admitting: Cardiology

## 2018-01-23 ENCOUNTER — Other Ambulatory Visit: Payer: Self-pay

## 2018-01-23 MED ORDER — NITROGLYCERIN 0.4 MG SL SUBL: 0.4000 mg | SUBLINGUAL_TABLET | SUBLINGUAL | 3 refills | Status: DC | PRN

## 2018-02-16 ENCOUNTER — Other Ambulatory Visit: Payer: Self-pay | Admitting: Family Medicine

## 2018-03-11 ENCOUNTER — Other Ambulatory Visit: Payer: Self-pay | Admitting: Family Medicine

## 2018-03-14 ENCOUNTER — Encounter: Payer: Self-pay | Admitting: Family Medicine

## 2018-03-14 ENCOUNTER — Telehealth: Payer: Self-pay | Admitting: Cardiology

## 2018-03-14 ENCOUNTER — Ambulatory Visit: Payer: Medicare Other | Admitting: Family Medicine

## 2018-03-14 VITALS — BP 130/64 | HR 52 | Temp 97.8°F | Wt 188.6 lb

## 2018-03-14 DIAGNOSIS — I10 Essential (primary) hypertension: Secondary | ICD-10-CM

## 2018-03-14 DIAGNOSIS — E785 Hyperlipidemia, unspecified: Secondary | ICD-10-CM | POA: Diagnosis not present

## 2018-03-14 DIAGNOSIS — N184 Chronic kidney disease, stage 4 (severe): Secondary | ICD-10-CM | POA: Diagnosis not present

## 2018-03-14 DIAGNOSIS — E1121 Type 2 diabetes mellitus with diabetic nephropathy: Secondary | ICD-10-CM

## 2018-03-14 DIAGNOSIS — I255 Ischemic cardiomyopathy: Secondary | ICD-10-CM

## 2018-03-14 LAB — LIPID PANEL
CHOL/HDL RATIO: 3
Cholesterol: 111 mg/dL (ref 0–200)
HDL: 34.9 mg/dL — ABNORMAL LOW (ref >39.00)
LDL CALC: 49 mg/dL (ref 0–99)
NonHDL: 76.13
TRIGLYCERIDES: 137 mg/dL (ref 0.0–149.0)
VLDL: 27.4 mg/dL (ref 0.0–40.0)

## 2018-03-14 LAB — HEPATIC FUNCTION PANEL
ALK PHOS: 81 U/L (ref 39–117)
ALT: 20 U/L (ref 0–53)
AST: 11 U/L (ref 0–37)
Albumin: 4.5 g/dL (ref 3.5–5.2)
BILIRUBIN DIRECT: 0.3 mg/dL (ref 0.0–0.3)
TOTAL PROTEIN: 7.8 g/dL (ref 6.0–8.3)
Total Bilirubin: 1.3 mg/dL — ABNORMAL HIGH (ref 0.2–1.2)

## 2018-03-14 LAB — BASIC METABOLIC PANEL
BUN: 27 mg/dL — AB (ref 6–23)
CALCIUM: 9.9 mg/dL (ref 8.4–10.5)
CHLORIDE: 99 meq/L (ref 96–112)
CO2: 27 meq/L (ref 19–32)
Creatinine, Ser: 1.97 mg/dL — ABNORMAL HIGH (ref 0.40–1.50)
GFR: 34.68 mL/min — ABNORMAL LOW (ref >60.00)
Glucose, Bld: 196 mg/dL — ABNORMAL HIGH (ref 70–99)
Potassium: 4.1 mEq/L (ref 3.5–5.1)
Sodium: 135 mEq/L (ref 135–145)

## 2018-03-14 LAB — POCT GLYCOSYLATED HEMOGLOBIN (HGB A1C): Hemoglobin A1C: 6.6 % — AB (ref 4.0–5.6)

## 2018-03-14 NOTE — Patient Instructions (Signed)
Set up diabetic eye exam through the [facility]

## 2018-03-14 NOTE — Progress Notes (Signed)
Subjective:     Patient ID: [name], male   DOB: 11-07-1932, 84 y.o.   MRN: [id number]  HPI Patient seen for medical follow-up. He has history of type 2 diabetes, chronic kidney disease, CAD, ischemic cardiomyopathy, atrial fibrillation, hypertension  Type 2 diabetes. Not monitoring blood sugars regularly. Denies any polyuria or polydipsia. He has not had eye exam in over one year  Chronic kidney disease followed by nephrology. Baseline creatinine around 1.8. Denies any recent peripheral edema issues.  He has some chronic dyspnea but is unchanged. No orthopnea. No recent peripheral edema. Weight is stable. He does report some occasional pains in his legs with ambulation but can easily ambulate 100 yards without much difficulty.  Past Medical History:  Diagnosis Date  . Arthritis   . CAD (coronary artery disease)    a. Cath September 2015 LIMA to the LAD patent, SVG to PDA patent, SVG to posterior lateral patent, SVG to OM with a 90% in-stent restenosis at an anastomotic lesion. This was treated with angioplasty. b. cath 04/01/2015 95% ISR in SVG to OM treated with 2.75x24 Synergy DES postdilated to 3.65mm, all other grafts patent  . Cataract    surgery,B/L  . CHF (congestive heart failure) ([place])   . Chronic kidney disease    nephrolithiasis  . Diabetes mellitus    TYPE 2  . GERD (gastroesophageal reflux disease)   . Heart murmur   . Hernia   . Hypercholesterolemia   . Hypertension   . Incontinence    hx  over 1 year,leaks without awareness  . Other and unspecified diseases of appendix   . PAF (paroxysmal atrial fibrillation) ([place])    in the setting of ischemia on 03/31/2015  . Pancreatitis   . Prostate CA ([place]) 03/01/04   prostate bx=Adenocarcinoam,gleason 3+4=7,PSA=6.75  . Shortness of breath dyspnea   . Ulcer    hx gastric   Past Surgical History:  Procedure Laterality Date  . APPENDECTOMY    . BACK SURGERY    . CARDIAC CATHETERIZATION N/A 04/01/2015   Procedure: Left  Heart Cath and Cors/Grafts Angiography;  Surgeon: Jettie Booze, MD;  Location: [place] [facility];  Service: Cardiovascular;  Laterality: N/A;  . CARDIAC CATHETERIZATION N/A 04/01/2015   Procedure: Coronary Stent Intervention;  Surgeon: Jettie Booze, MD;  Location: [place] [facility];  Service: Cardiovascular;  Laterality: N/A;  . CARPAL TUNNEL RELEASE  2004   both hands  . CORONARY ARTERY BYPASS GRAFT    . CYSTOSCOPY  08/23/12   incomplete emoptying bladder  . HERNIA REPAIR    . incision and drainage of right chest abscess    . LAPAROSCOPIC CHOLECYSTECTOMY  09/2009  . LEFT HEART CATHETERIZATION WITH CORONARY ANGIOGRAM N/A 07/19/2014   Procedure: LEFT HEART CATHETERIZATION WITH CORONARY ANGIOGRAM;  Surgeon: Leonie Man, MD;  Location: [facility] CATH LAB;  Service: Cardiovascular;  Laterality: N/A;  . RECTAL SURGERY    . ROBOT ASSISTED LAPAROSCOPIC RADICAL PROSTATECTOMY  2005    reports that he quit smoking about 45 years ago. His smoking use included cigarettes. He has a 2.50 pack-year smoking history. He has never used smokeless tobacco. He reports that he does not drink alcohol or use drugs. family history includes Cancer in his mother. Allergies  Allergen Reactions  . Codeine   . Morphine     REACTION: does not like     Review of Systems  Constitutional: Negative for fatigue and unexpected weight change.  Eyes: Negative for visual  disturbance.  Respiratory: Positive for shortness of breath (Chronic and unchanged). Negative for cough and chest tightness.   Cardiovascular: Negative for chest pain, palpitations and leg swelling.  Endocrine: Negative for polydipsia and polyuria.  Neurological: Negative for dizziness, syncope, weakness, light-headedness and headaches.       Objective:   Physical Exam  Constitutional: He is oriented to person, place, and time. He appears well-developed and well-nourished.  HENT:  Right Ear: External ear normal.  Left Ear: External ear  normal.  Mouth/Throat: Oropharynx is clear and moist.  Eyes: Pupils are equal, round, and reactive to light.  Neck: Neck supple. No thyromegaly present.  Cardiovascular: Normal rate and regular rhythm.  Patient's right foot slightly cooler to touch than the left. He has diminished dorsalis pedis pulses bilaterally. Capillary refill slightly slower right foot compared to left  Pulmonary/Chest: Effort normal and breath sounds normal. No respiratory distress. He has no wheezes. He has no rales.  Musculoskeletal: He exhibits no edema.  Neurological: He is alert and oriented to person, place, and time.  Skin:  Patient has diminished sensation with monofilament testing bilaterally. No calluses. No ulcerations.       Assessment:     #1 type 2 diabetes well controlled A1c 6.6%  #2 chronic kidney disease  #3 history of CAD with ischemic cardiomyopathy overall stable symptomatically  #4 history of dyslipidemia  #5 peripheral vascular disease clinically by exam. He does not describe any recent progressive claudication symptoms at this point with basic activities and not greatly limited by claudication    Plan:     -Set up diabetic eye exam -Check further labs with lipid panel, hepatic panel, basic metabolic panel -Discussed claudication issues. We explained some point should probably get baseline arterial Dopplers-he would like to wait since he is symptomatically stable this time.  [name] MD [facility] at [facility]

## 2018-03-14 NOTE — Telephone Encounter (Signed)
New message    Pt c/o medication issue:  1. Name of Medication: spironolactone (ALDACTONE) 25 MG tablet  2. How are you currently taking this medication (dosage and times per day)? TAKE 1/2 TABLET BY MOUTH TWICE A DAY Patient taking differently: TAKE 1 TABLET BY MOUTH daily  3. Are you having a reaction (difficulty breathing--STAT)? No  4. What is your medication issue? Insurance will no longer. Patients wife requesting a different medication that would be covered.

## 2018-03-14 NOTE — Telephone Encounter (Signed)
Spoke to wife Mrs Giannone,wife states she called about the wrong medication - she  States it should be about Vesicare. Wife states she already has taken care of the matter.

## 2018-03-23 NOTE — Progress Notes (Addendum)
HPI The patient presents for followup of CAD.   He had chest pain and had a cardiac catheterization on 04/01/2015 which showed a 95% in-stent restenosis in SVG to OM, treated with 2.75 x 24 mm Synergy DES postdilated to 3.3 mm. He had patent LIMA to LAD, patent SVG to posterolateral, patent SVG to PDA.    Of note he did have atrial fibrillation with rate control on presentation but he converted spontaneously. He was not started on warfarin because he was taking aspirin and Plavix.  He has had no symptomatic recurrence of this arrhythmia.   He wore an event monitor and there was no evidence of atrial fib.  He was having SOB in Dec.  He had a fixed inferior defect. The BNP was mildly increased. I had him increase his Lasix for a few days.  He returns for follow up.    He still has shortness of breath walking a moderate distance on level ground but he says limits him.  He did not notice much difference with increased Lasix when he took it.  He will get some fleeting chest discomfort but no substernal chest pressure.  He does not describe any new palpitations though he has some fleeting palpitations.  We have evaluated this with an event monitor before and there was no fibrillation.  He has had no neck or arm discomfort.  Is had no presyncope or syncope.  Is not describing PND or orthopnea.  He does not take any nitroglycerin.   Allergies  Allergen Reactions  . Codeine   . Morphine     REACTION: does not like    Current Outpatient Medications  Medication Sig Dispense Refill  . Albuterol Sulfate (PROAIR RESPICLICK) 284 (90 Base) MCG/ACT AEPB Inhale 2 puffs into the lungs every 4 (four) hours. 1 each 0  . amLODipine (NORVASC) 10 MG tablet Take 1 tablet (10 mg total) by mouth daily. 90 tablet 0  . carvedilol (COREG) 12.5 MG tablet TAKE 1 TABLET BY MOUTH TWICE A DAY WITH A MEAL 180 tablet 4  . clopidogrel (PLAVIX) 75 MG tablet TAKE 1 TABLET (75 MG TOTAL) BY MOUTH DAILY. 30 tablet 3  . furosemide  (LASIX) 40 MG tablet TAKE 1 TABLET BY MOUTH EVERY DAY (Patient taking differently: TAKE half TABLET BY MOUTH EVERY DAY) 90 tablet 2  . glucose blood (FREESTYLE LITE) test strip 1 each by Other route as needed. Use as instructed     . JANUVIA 50 MG tablet TAKE 1 TABLET BY MOUTH EVERY DAY 90 tablet 2  . Lancets (FREESTYLE) lancets 1 each by Other route as needed. Use as instructed     . nitroGLYCERIN (NITROSTAT) 0.4 MG SL tablet Place 1 tablet (0.4 mg total) under the tongue every 5 (five) minutes as needed for chest pain. 25 tablet 3  . pantoprazole (PROTONIX) 40 MG tablet TAKE 1 TABLET (40 MG TOTAL) BY MOUTH DAILY. 90 tablet 1  . rosuvastatin (CRESTOR) 20 MG tablet TAKE 1 TABLET BY MOUTH EVERY DAY 90 tablet 0  . spironolactone (ALDACTONE) 25 MG tablet TAKE 1/2 TABLET BY MOUTH TWICE A DAY (Patient taking differently: TAKE 1 TABLET BY MOUTH daily) 90 tablet 2  . traMADol (ULTRAM) 50 MG tablet Take 1 tablet (50 mg total) by mouth every 6 (six) hours as needed. 1-2 every 6 h prn pain 120 tablet 5   No current facility-administered medications for this visit.     Past Medical History:  Diagnosis Date  . Arthritis   .  CAD (coronary artery disease)    a. Cath September 2015 LIMA to the LAD patent, SVG to PDA patent, SVG to posterior lateral patent, SVG to OM with a 90% in-stent restenosis at an anastomotic lesion. This was treated with angioplasty. b. cath 04/01/2015 95% ISR in SVG to OM treated with 2.75x24 Synergy DES postdilated to 3.71mm, all other grafts patent  . Cataract    surgery,B/L  . CHF (congestive heart failure) ([place])   . Chronic kidney disease    nephrolithiasis  . Diabetes mellitus    TYPE 2  . GERD (gastroesophageal reflux disease)   . Heart murmur   . Hernia   . Hypercholesterolemia   . Hypertension   . Incontinence    hx  over 1 year,leaks without awareness  . Other and unspecified diseases of appendix   . PAF (paroxysmal atrial fibrillation) ([place])    in the setting of  ischemia on 03/31/2015  . Pancreatitis   . Prostate CA ([place]) 03/01/04   prostate bx=Adenocarcinoam,gleason 3+4=7,PSA=6.75  . Shortness of breath dyspnea   . Ulcer    hx gastric    Past Surgical History:  Procedure Laterality Date  . APPENDECTOMY    . BACK SURGERY    . CARDIAC CATHETERIZATION N/A 04/01/2015   Procedure: Left Heart Cath and Cors/Grafts Angiography;  Surgeon: Jettie Booze, MD;  Location: [place] [facility];  Service: Cardiovascular;  Laterality: N/A;  . CARDIAC CATHETERIZATION N/A 04/01/2015   Procedure: Coronary Stent Intervention;  Surgeon: Jettie Booze, MD;  Location: [place] [facility];  Service: Cardiovascular;  Laterality: N/A;  . CARPAL TUNNEL RELEASE  2004   both hands  . CORONARY ARTERY BYPASS GRAFT    . CYSTOSCOPY  08/23/12   incomplete emoptying bladder  . HERNIA REPAIR    . incision and drainage of right chest abscess    . LAPAROSCOPIC CHOLECYSTECTOMY  09/2009  . LEFT HEART CATHETERIZATION WITH CORONARY ANGIOGRAM N/A 07/19/2014   Procedure: LEFT HEART CATHETERIZATION WITH CORONARY ANGIOGRAM;  Surgeon: Leonie Man, MD;  Location: [facility] CATH LAB;  Service: Cardiovascular;  Laterality: N/A;  . RECTAL SURGERY    . ROBOT ASSISTED LAPAROSCOPIC RADICAL PROSTATECTOMY  2005    ROS:  As stated in the HPI and negative for all other systems.  PHYSICAL EXAM BP (!) 155/82   Pulse (!) 46   Ht 5\' 9"  (1.753 m)   Wt 184 lb 6.4 oz (83.6 kg)   BMI 27.23 kg/m   GENERAL:  Well appearing NECK:  No jugular venous distention, waveform within normal limits, carotid upstroke brisk and symmetric, no bruits, no thyromegaly LUNGS:  Clear to auscultation bilaterally CHEST: Well healed sternotomy scar. HEART:  PMI not displaced or sustained,S1 and S2 within normal limits, no S3, no S4, no clicks, no rubs, very soft apical systolic murmurs ABD:  Flat, positive bowel sounds normal in frequency in pitch, no bruits, no rebound, no guarding, no midline pulsatile mass, no  hepatomegaly, no splenomegaly EXT:  2 plus pulses throughout, no edema, no cyanosis no clubbing   EKG: Sinus bradycardia, rate 46, axis within normal limits, left bundle branch block, no acute ST-T wave changes.    Lab Results  Component Value Date   CHOL 111 03/14/2018   TRIG 137.0 03/14/2018   HDL 34.90 (L) 03/14/2018   LDLCALC 49 03/14/2018   LDLDIRECT 143.3 11/23/2007   Lab Results  Component Value Date   HGBA1C 6.6 (A) 03/14/2018   Lab Results  Component Value  Date   CREATININE 1.97 (H) 03/14/2018   Lab Results  Component Value Date   HGBA1C 6.6 (A) 03/14/2018     ASSESSMENT AND PLAN   CORONARY ARTERY DISEASE  He had no ischemia on Lexiscan Myoview.  No further ischemia work up is indicated.   Atrial fib He has had no symptomatic or documented recurrence since he was in the hospital a couple of years ago.  However, he has had no further evidence of this and I could not find it on a monitor last fall.  For now he will not get anticoagulation.  Hypertension  The blood pressure is elevated but this is unusual.  I will make no change to his therapy.    Murmur:   He had mild AS in the past.  Given the SOB I will check an echo.   We did walk him around the office and he was breathless but his sats stayed in the mid 90s.    Carotid stenosis-Bilateral  He has moderate disease and is due to have follow up this month.    CKD: His creatinine was 1.97 last month.  This is followed by [name], MD

## 2018-03-24 ENCOUNTER — Encounter: Payer: Self-pay | Admitting: Cardiology

## 2018-03-24 ENCOUNTER — Ambulatory Visit (INDEPENDENT_AMBULATORY_CARE_PROVIDER_SITE_OTHER): Payer: Medicare Other | Admitting: Cardiology

## 2018-03-24 VITALS — BP 155/82 | HR 46 | Ht 69.0 in | Wt 184.4 lb

## 2018-03-24 DIAGNOSIS — I6523 Occlusion and stenosis of bilateral carotid arteries: Secondary | ICD-10-CM

## 2018-03-24 DIAGNOSIS — I350 Nonrheumatic aortic (valve) stenosis: Secondary | ICD-10-CM | POA: Diagnosis not present

## 2018-03-24 DIAGNOSIS — R0602 Shortness of breath: Secondary | ICD-10-CM

## 2018-03-24 DIAGNOSIS — I25118 Atherosclerotic heart disease of native coronary artery with other forms of angina pectoris: Secondary | ICD-10-CM

## 2018-03-24 NOTE — Patient Instructions (Signed)
Medication Instructions:  Continue current medications  If you need a refill on your cardiac medications before your next appointment, please call your pharmacy.  Labwork: None Ordered  Testing/Procedures: Your physician has requested that you have an echocardiogram. Echocardiography is a painless test that uses sound waves to create images of your heart. It provides your doctor with information about the size and shape of your heart and how well your heart's chambers and valves are working. This procedure takes approximately one hour. There are no restrictions for this procedure.  Your physician has requested that you have a carotid duplex. This test is an ultrasound of the carotid arteries in your neck. It looks at blood flow through these arteries that supply the brain with blood. Allow one hour for this exam. There are no restrictions or special instructions.    Follow-Up: Your physician wants you to follow-up in: 6 Months. You should receive a reminder letter in the mail two months in advance. If you do not receive a letter, please call our office [phone].      Thank you for choosing [facility] at [facility]!!

## 2018-04-06 ENCOUNTER — Ambulatory Visit (HOSPITAL_COMMUNITY)
Admission: RE | Admit: 2018-04-06 | Discharge: 2018-04-06 | Disposition: A | Discharge status: Home / Self Care | Payer: Medicare Other | Source: Ambulatory Visit | Attending: Cardiology | Admitting: Cardiology

## 2018-04-06 ENCOUNTER — Other Ambulatory Visit: Payer: Self-pay

## 2018-04-06 ENCOUNTER — Ambulatory Visit (HOSPITAL_BASED_OUTPATIENT_CLINIC_OR_DEPARTMENT_OTHER): Payer: Medicare Other

## 2018-04-06 DIAGNOSIS — I2510 Atherosclerotic heart disease of native coronary artery without angina pectoris: Secondary | ICD-10-CM | POA: Diagnosis not present

## 2018-04-06 DIAGNOSIS — R011 Cardiac murmur, unspecified: Secondary | ICD-10-CM | POA: Insufficient documentation

## 2018-04-06 DIAGNOSIS — R0602 Shortness of breath: Secondary | ICD-10-CM | POA: Diagnosis not present

## 2018-04-06 DIAGNOSIS — I6523 Occlusion and stenosis of bilateral carotid arteries: Secondary | ICD-10-CM | POA: Insufficient documentation

## 2018-04-06 DIAGNOSIS — I509 Heart failure, unspecified: Secondary | ICD-10-CM | POA: Diagnosis not present

## 2018-04-06 DIAGNOSIS — N189 Chronic kidney disease, unspecified: Secondary | ICD-10-CM | POA: Diagnosis not present

## 2018-04-06 DIAGNOSIS — I350 Nonrheumatic aortic (valve) stenosis: Secondary | ICD-10-CM | POA: Insufficient documentation

## 2018-04-06 DIAGNOSIS — I480 Paroxysmal atrial fibrillation: Secondary | ICD-10-CM | POA: Diagnosis not present

## 2018-04-10 ENCOUNTER — Telehealth: Payer: Self-pay | Admitting: *Deleted

## 2018-04-10 DIAGNOSIS — I6523 Occlusion and stenosis of bilateral carotid arteries: Secondary | ICD-10-CM

## 2018-04-10 NOTE — Telephone Encounter (Signed)
-----   Message from [name], MD sent at 04/08/2018  9:29 PM EDT ----- Right Carotid: Velocities in the right ICA are consistent with a 40-59%        stenosis Left Carotid: Velocities in the left ICA are consistent with a 40-59% stenosis. Follow up in one year. Call Mr. Hao with the results and send results to [name], MD

## 2018-04-10 NOTE — Telephone Encounter (Signed)
Pt aware of his carotid doppler and Echo, carotid order placed for 1 year

## 2018-04-11 ENCOUNTER — Telehealth: Payer: Self-pay | Admitting: Family Medicine

## 2018-04-11 MED ORDER — AMLODIPINE BESYLATE 10 MG PO TABS
10.0000 mg | ORAL_TABLET | Freq: Every day | ORAL | 0 refills | Status: DC
Start: 2018-04-11 — End: 2018-07-05

## 2018-04-11 NOTE — Telephone Encounter (Signed)
Copied from [place] [id number]. Topic: Quick Communication - Rx Refill/Question >> Apr 11, 2018  3:09 PM Margot Ables wrote: Medication: amLODipine (NORVASC) 10 MG tablet - pt wife called stating pharmacy told her to call to request new RX - pt has a few days but leaving for the beach Friday 04/14/18 - requesting RX for 90 day supply Has the patient contacted their pharmacy? yes Preferred Pharmacy (with phone number or street name): [id number] - [place], [address] [phone] (Phone) [phone] (Fax)

## 2018-04-16 ENCOUNTER — Other Ambulatory Visit: Payer: Self-pay | Admitting: Cardiology

## 2018-04-16 ENCOUNTER — Other Ambulatory Visit: Payer: Self-pay | Admitting: Family Medicine

## 2018-05-05 ENCOUNTER — Other Ambulatory Visit: Payer: Self-pay | Admitting: Cardiology

## 2018-05-05 NOTE — Telephone Encounter (Signed)
Rx sent to pharmacy   

## 2018-05-15 ENCOUNTER — Other Ambulatory Visit: Payer: Self-pay | Admitting: Family Medicine

## 2018-05-22 ENCOUNTER — Other Ambulatory Visit: Payer: Self-pay | Admitting: Cardiology

## 2018-07-05 ENCOUNTER — Other Ambulatory Visit: Payer: Self-pay | Admitting: Family Medicine

## 2018-08-03 ENCOUNTER — Other Ambulatory Visit: Payer: Self-pay | Admitting: Family Medicine

## 2018-09-13 ENCOUNTER — Ambulatory Visit: Payer: Medicare Other | Admitting: Family Medicine

## 2018-09-13 ENCOUNTER — Encounter: Payer: Self-pay | Admitting: Family Medicine

## 2018-09-13 VITALS — BP 122/58 | HR 61 | Temp 98.2°F | Ht 69.0 in | Wt 185.0 lb

## 2018-09-13 DIAGNOSIS — Z23 Encounter for immunization: Secondary | ICD-10-CM

## 2018-09-13 DIAGNOSIS — L570 Actinic keratosis: Secondary | ICD-10-CM | POA: Diagnosis not present

## 2018-09-13 DIAGNOSIS — E1121 Type 2 diabetes mellitus with diabetic nephropathy: Secondary | ICD-10-CM | POA: Diagnosis not present

## 2018-09-13 DIAGNOSIS — E785 Hyperlipidemia, unspecified: Secondary | ICD-10-CM

## 2018-09-13 DIAGNOSIS — N289 Disorder of kidney and ureter, unspecified: Secondary | ICD-10-CM

## 2018-09-13 DIAGNOSIS — I25700 Atherosclerosis of coronary artery bypass graft(s), unspecified, with unstable angina pectoris: Secondary | ICD-10-CM | POA: Diagnosis not present

## 2018-09-13 DIAGNOSIS — I10 Essential (primary) hypertension: Secondary | ICD-10-CM | POA: Diagnosis not present

## 2018-09-13 DIAGNOSIS — N189 Chronic kidney disease, unspecified: Secondary | ICD-10-CM

## 2018-09-13 LAB — POCT GLYCOSYLATED HEMOGLOBIN (HGB A1C): Hemoglobin A1C: 6.6 % — AB (ref 4.0–5.6)

## 2018-09-13 NOTE — Progress Notes (Addendum)
Subjective:     Patient ID: [name], male   DOB: 09-16-1933, 84 y.o.   MRN: [id number]  HPI   Patient seen for medical follow-up.  He has history of CAD, ischemic cardiomyopathy, hypertension, peripheral vascular disease, type 2 diabetes, bilateral carotid stenosis, GERD, chronic kidney disease, dyslipidemia.  He saw cardiologist back in June and had repeat carotid Dopplers with no significant progression of disease.  Echocardiogram ejection fraction 55 to 60% with mild aortic stenosis which is essentially unchanged. Nuclear stress test 12/18 no ischemia.  He continues to have some dyspnea which is unchanged.  No peripheral edema.  No orthopnea.  Type 2 diabetes.  Last A1c 6.6%.  Not monitoring blood sugars regularly.  We checked his lipids back in May and these were stable.  He has history of actinic keratoses and has multi-scaly areas including dorsum of both ears, nose, and left face and requesting treatment.  Past Medical History:  Diagnosis Date  . Arthritis   . CAD (coronary artery disease)    a. Cath September 2015 LIMA to the LAD patent, SVG to PDA patent, SVG to posterior lateral patent, SVG to OM with a 90% in-stent restenosis at an anastomotic lesion. This was treated with angioplasty. b. cath 04/01/2015 95% ISR in SVG to OM treated with 2.75x24 Synergy DES postdilated to 3.17mm, all other grafts patent  . Cataract    surgery,B/L  . CHF (congestive heart failure) ([place])   . Chronic kidney disease    nephrolithiasis  . Diabetes mellitus    TYPE 2  . GERD (gastroesophageal reflux disease)   . Heart murmur   . Hernia   . Hypercholesterolemia   . Hypertension   . Incontinence    hx  over 1 year,leaks without awareness  . Other and unspecified diseases of appendix   . PAF (paroxysmal atrial fibrillation) ([place])    in the setting of ischemia on 03/31/2015  . Pancreatitis   . Prostate CA ([place]) 03/01/04   prostate bx=Adenocarcinoam,gleason 3+4=7,PSA=6.75  . Shortness of breath  dyspnea   . Ulcer    hx gastric   Past Surgical History:  Procedure Laterality Date  . APPENDECTOMY    . BACK SURGERY    . CARDIAC CATHETERIZATION N/A 04/01/2015   Procedure: Left Heart Cath and Cors/Grafts Angiography;  Surgeon: Jettie Booze, MD;  Location: [place] [facility];  Service: Cardiovascular;  Laterality: N/A;  . CARDIAC CATHETERIZATION N/A 04/01/2015   Procedure: Coronary Stent Intervention;  Surgeon: Jettie Booze, MD;  Location: [place] [facility];  Service: Cardiovascular;  Laterality: N/A;  . CARPAL TUNNEL RELEASE  2004   both hands  . CORONARY ARTERY BYPASS GRAFT    . CYSTOSCOPY  08/23/12   incomplete emoptying bladder  . HERNIA REPAIR    . incision and drainage of right chest abscess    . LAPAROSCOPIC CHOLECYSTECTOMY  09/2009  . LEFT HEART CATHETERIZATION WITH CORONARY ANGIOGRAM N/A 07/19/2014   Procedure: LEFT HEART CATHETERIZATION WITH CORONARY ANGIOGRAM;  Surgeon: Leonie Man, MD;  Location: [facility] CATH LAB;  Service: Cardiovascular;  Laterality: N/A;  . RECTAL SURGERY    . ROBOT ASSISTED LAPAROSCOPIC RADICAL PROSTATECTOMY  2005    reports that he quit smoking about 46 years ago. His smoking use included cigarettes. He has a 2.50 pack-year smoking history. He has never used smokeless tobacco. He reports that he does not drink alcohol or use drugs. family history includes Cancer in his mother. Allergies  Allergen Reactions  .  Codeine   . Morphine     REACTION: does not like     Review of Systems  Constitutional: Negative for fatigue and unexpected weight change.  Eyes: Negative for visual disturbance.  Respiratory: Positive for shortness of breath. Negative for cough, chest tightness and wheezing.   Cardiovascular: Negative for chest pain, palpitations and leg swelling.  Gastrointestinal: Negative for abdominal pain.  Genitourinary: Negative for dysuria.  Neurological: Negative for dizziness, syncope, weakness, light-headedness and headaches.        Objective:   Physical Exam  Constitutional: He is oriented to person, place, and time. He appears well-developed and well-nourished.  HENT:  Right Ear: External ear normal.  Left Ear: External ear normal.  Mouth/Throat: Oropharynx is clear and moist.  Eyes: Pupils are equal, round, and reactive to light.  Neck: Neck supple. No thyromegaly present.  Cardiovascular: Normal rate and regular rhythm.  Pulmonary/Chest: Effort normal and breath sounds normal. No respiratory distress. He has no wheezes. He has no rales.  Musculoskeletal: He exhibits no edema.  Neurological: He is alert and oriented to person, place, and time.  Skin:  He has multiple thickened whitish scaly lesions including tip of the nose, dorsum of right and left ear, and two separate areas left temporal area which are about 4 x 5 mm.  No ulceration.       Assessment:     #1 history of CAD  #2 chronic dyspnea.  Recent echo as above with no progression of aortic stenosis and EF 55 to 60%.  No recent chest pains  #3 chronic kidney disease  #4 type 2 diabetes  #5 actinic keratoses as above    Plan:     -Recheck A1c=6.6% -Discussed risk and benefits of treatment of actinic keratoses with liquid nitrogen and patient consents.  He has had these treated similarly in the past. -Plan full labs with follow-up in 6 months -flu vaccine given.  [name] MD [facility] at [place] on 09-16-18.  Pt had a total of 5 actinic keratoses treated as above.  Tolerated well.  [name] MD [facility]

## 2018-09-13 NOTE — Patient Instructions (Signed)
Your A1c today was 6.6%  Follow-up immediately with [place] or cardiology for any chest pain or progressive shortness of breath.  Let us plan six-month follow-up and will get full panel of labs at that time

## 2018-09-24 NOTE — Progress Notes (Signed)
HPI The patient presents for followup of CAD.   He had chest pain and had a cardiac catheterization on 04/01/2015 which showed a 95% in-stent restenosis in SVG to OM, treated with 2.75 x 24 mm Synergy DES postdilated to 3.3 mm. He had patent LIMA to LAD, patent SVG to posterolateral, patent SVG to PDA.    Of note he did have atrial fibrillation with rate control on presentation but he converted spontaneously. He was not started on warfarin because he was taking aspirin and Plavix.  He has had no symptomatic recurrence of this arrhythmia.   He wore an event monitor and there was no evidence of atrial fib.  He was having SOB in Dec 2018.  On [place] he had a fixed inferior defect. The BNP was mildly increased. I had him increase his Lasix for a few days.  He returns for follow up.    He presents for follow-up.  He is done really well he says he feels good.  However, coincidentally he has had to take 2 nitroglycerin in the last couple of weeks.  However, this seems to be a stable pattern.  He does not report any increased symptoms since his perfusion study last year.  He has some chronic baseline shortness of breath but he is able to walk back and forth to his out building frequently.  He does not have PND or orthopnea.  He has no new weight gain or edema.  Allergies  Allergen Reactions  . Codeine   . Morphine     REACTION: does not like    Current Outpatient Medications  Medication Sig Dispense Refill  . Albuterol Sulfate (PROAIR RESPICLICK) 270 (90 Base) MCG/ACT AEPB Inhale 2 puffs into the lungs every 4 (four) hours. 1 each 0  . amLODipine (NORVASC) 10 MG tablet TAKE 1 TABLET BY MOUTH EVERY DAY 90 tablet 0  . carvedilol (COREG) 12.5 MG tablet TAKE 1 TABLET BY MOUTH TWICE A DAY WITH A MEAL 180 tablet 4  . clopidogrel (PLAVIX) 75 MG tablet TAKE 1 TABLET (75 MG TOTAL) BY MOUTH DAILY. 30 tablet 3  . furosemide (LASIX) 40 MG tablet TAKE 1 TABLET BY MOUTH EVERY DAY 90 tablet 2  .  glucose blood (FREESTYLE LITE) test strip 1 each by Other route as needed. Use as instructed     . JANUVIA 50 MG tablet TAKE 1 TABLET BY MOUTH EVERY DAY 90 tablet 2  . Lancets (FREESTYLE) lancets 1 each by Other route as needed. Use as instructed     . nitroGLYCERIN (NITROSTAT) 0.4 MG SL tablet Place 1 tablet (0.4 mg total) under the tongue every 5 (five) minutes x 3 doses as needed for chest pain (if no relief after 3rd dose, proceed to the ED for an evaluation). 75 tablet 1  . pantoprazole (PROTONIX) 40 MG tablet TAKE 1 TABLET (40 MG TOTAL) BY MOUTH DAILY. 90 tablet 1  . rosuvastatin (CRESTOR) 20 MG tablet TAKE 1 TABLET BY MOUTH EVERY DAY 90 tablet 2  . spironolactone (ALDACTONE) 25 MG tablet Take 1 tablet (25 mg total) by mouth daily. 90 tablet 3  . traMADol (ULTRAM) 50 MG tablet Take 1 tablet (50 mg total) by mouth every 6 (six) hours as needed. 1-2 every 6 h prn pain 120 tablet 5   No current facility-administered medications for this visit.     Past Medical History:  Diagnosis Date  . Arthritis   . CAD (coronary artery disease)  a. Cath September 2015 LIMA to the LAD patent, SVG to PDA patent, SVG to posterior lateral patent, SVG to OM with a 90% in-stent restenosis at an anastomotic lesion. This was treated with angioplasty. b. cath 04/01/2015 95% ISR in SVG to OM treated with 2.75x24 Synergy DES postdilated to 3.80mm, all other grafts patent  . Cataract    surgery,B/L  . CHF (congestive heart failure) ([place])   . Chronic kidney disease    nephrolithiasis  . Diabetes mellitus    TYPE 2  . GERD (gastroesophageal reflux disease)   . Heart murmur   . Hernia   . Hypercholesterolemia   . Hypertension   . Incontinence    hx  over 1 year,leaks without awareness  . Other and unspecified diseases of appendix   . PAF (paroxysmal atrial fibrillation) ([place])    in the setting of ischemia on 03/31/2015  . Pancreatitis   . Prostate CA ([place]) 03/01/04   prostate bx=Adenocarcinoam,gleason  3+4=7,PSA=6.75  . Shortness of breath dyspnea   . Ulcer    hx gastric    Past Surgical History:  Procedure Laterality Date  . APPENDECTOMY    . BACK SURGERY    . CARDIAC CATHETERIZATION N/A 04/01/2015   Procedure: Left Heart Cath and Cors/Grafts Angiography;  Surgeon: Jettie Booze, MD;  Location: [place] [facility];  Service: Cardiovascular;  Laterality: N/A;  . CARDIAC CATHETERIZATION N/A 04/01/2015   Procedure: Coronary Stent Intervention;  Surgeon: Jettie Booze, MD;  Location: [place] [facility];  Service: Cardiovascular;  Laterality: N/A;  . CARPAL TUNNEL RELEASE  2004   both hands  . CORONARY ARTERY BYPASS GRAFT    . CYSTOSCOPY  08/23/12   incomplete emoptying bladder  . HERNIA REPAIR    . incision and drainage of right chest abscess    . LAPAROSCOPIC CHOLECYSTECTOMY  09/2009  . LEFT HEART CATHETERIZATION WITH CORONARY ANGIOGRAM N/A 07/19/2014   Procedure: LEFT HEART CATHETERIZATION WITH CORONARY ANGIOGRAM;  Surgeon: Leonie Man, MD;  Location: [facility] CATH LAB;  Service: Cardiovascular;  Laterality: N/A;  . RECTAL SURGERY    . ROBOT ASSISTED LAPAROSCOPIC RADICAL PROSTATECTOMY  2005    ROS: As stated in the HPI and negative for all other systems.  PHYSICAL EXAM BP 128/66   Pulse 65   Ht 5\' 9"  (1.753 m)   Wt 181 lb 12.8 oz (82.5 kg)   BMI 26.85 kg/m   GENERAL:  Well appearing NECK:  No jugular venous distention, waveform within normal limits, carotid upstroke brisk and symmetric, no bruits, no thyromegaly LUNGS:  Clear to auscultation bilaterally CHEST:  Well healed sternotomy scar. HEART:  PMI not displaced or sustained,S1 and S2 within normal limits, no S3, no S4, no clicks, no rubs, 2 of 6 apical systolic murmur radiating slightly at aortic outflow tract, no diastolic murmurs murmurs ABD:  Flat, positive bowel sounds normal in frequency in pitch, no bruits, no rebound, no guarding, no midline pulsatile mass, no hepatomegaly, no splenomegaly EXT:  2 plus  pulses throughout, trace leg edema, no cyanosis no clubbing   EKG: Sinus bradycardia, rate 61, axis within normal limits, left bundle branch block, no acute ST-T wave.  This was [facility] at [facility] on September 20 I reviewed these EKGs.  Changes.    Lab Results  Component Value Date   CHOL 111 03/14/2018   TRIG 137.0 03/14/2018   HDL 34.90 (L) 03/14/2018   LDLCALC 49 03/14/2018   LDLDIRECT 143.3 11/23/2007   Lab  Results  Component Value Date   HGBA1C 6.6 (A) 09/13/2018   Lab Results  Component Value Date   CREATININE 1.97 (H) 03/14/2018   Lab Results  Component Value Date   HGBA1C 6.6 (A) 09/13/2018     ASSESSMENT AND PLAN   CORONARY ARTERY DISEASE  He had no ischemia on Lexiscan Myoview last year.  No change in therapy.  He will continue with risk reduction.  Atrial fib He has had no symptomatic or documented recurrence since he was in the hospital a couple of years ago.  He is never had any symptomatic recurrence of this.  We never saw further documentation of this so no further work-up or anticoagulation was indicated.  He wore a monitor twice (2016 and 18) and he had no atrial fib.    Hypertension  The blood pressure is at target.  No change in therapy.   AS:   He had mild AS on echo in June.  I will follow this clinically.  No change in therapy.   Carotid stenosis-Bilateral  He has moderate bilateral disease in June and I will follow this in one year.    CKD: His creatinine was 1.97  In May.  No change in therapy.

## 2018-09-28 ENCOUNTER — Encounter: Payer: Self-pay | Admitting: Cardiology

## 2018-09-28 ENCOUNTER — Ambulatory Visit: Payer: Medicare Other | Admitting: Cardiology

## 2018-09-28 VITALS — BP 128/66 | HR 65 | Ht 69.0 in | Wt 181.8 lb

## 2018-09-28 DIAGNOSIS — I25118 Atherosclerotic heart disease of native coronary artery with other forms of angina pectoris: Secondary | ICD-10-CM

## 2018-09-28 DIAGNOSIS — N184 Chronic kidney disease, stage 4 (severe): Secondary | ICD-10-CM

## 2018-09-28 DIAGNOSIS — I6523 Occlusion and stenosis of bilateral carotid arteries: Secondary | ICD-10-CM

## 2018-09-28 DIAGNOSIS — I350 Nonrheumatic aortic (valve) stenosis: Secondary | ICD-10-CM | POA: Diagnosis not present

## 2018-09-28 DIAGNOSIS — I10 Essential (primary) hypertension: Secondary | ICD-10-CM | POA: Diagnosis not present

## 2018-09-28 NOTE — Patient Instructions (Signed)
Medication Instructions:  Your physician recommends that you continue on your current medications as directed. Please refer to the Current Medication list given to you today.  If you need a refill on your cardiac medications before your next appointment, please call your pharmacy.   Follow-Up: At [facility], you and your health needs are our priority.  As part of our continuing mission to provide you with exceptional heart care, we have created designated Provider Care Teams.  These Care Teams include your primary Cardiologist (physician) and Advanced Practice Providers (APPs -  Physician Assistants and Nurse Practitioners) who all work together to provide you with the care you need, when you need it. You will need a follow up appointment in 6 months with Dr. Percival Spanish. Please call our office in April 2020 to schedule your follow up appointment.  Advanced Practice Providers on your designated Care Team:   Rosaria Ferries, PA-C . Jory Sims, DNP, ANP  Any Other Special Instructions Will Be Listed Below (If Applicable). None.

## 2018-10-03 ENCOUNTER — Other Ambulatory Visit: Payer: Self-pay | Admitting: Family Medicine

## 2018-10-04 ENCOUNTER — Other Ambulatory Visit: Payer: Self-pay | Admitting: Cardiology

## 2018-11-01 ENCOUNTER — Ambulatory Visit (INDEPENDENT_AMBULATORY_CARE_PROVIDER_SITE_OTHER): Payer: Medicare Other

## 2018-11-01 ENCOUNTER — Encounter: Payer: Self-pay | Admitting: Family Medicine

## 2018-11-01 ENCOUNTER — Ambulatory Visit: Payer: Medicare Other | Admitting: Family Medicine

## 2018-11-01 ENCOUNTER — Ambulatory Visit: Payer: Self-pay

## 2018-11-01 ENCOUNTER — Telehealth: Payer: Self-pay | Admitting: Family Medicine

## 2018-11-01 VITALS — BP 140/60 | HR 62 | Temp 98.3°F | Ht 69.0 in | Wt 183.2 lb

## 2018-11-01 DIAGNOSIS — I25709 Atherosclerosis of coronary artery bypass graft(s), unspecified, with unspecified angina pectoris: Secondary | ICD-10-CM | POA: Diagnosis not present

## 2018-11-01 DIAGNOSIS — R0609 Other forms of dyspnea: Secondary | ICD-10-CM

## 2018-11-01 NOTE — Patient Instructions (Signed)
Shortness of Breath, Adult  Shortness of breath is when a person has trouble breathing enough air or when a person feels like she or he is having trouble breathing in enough air. Shortness of breath could be a sign of a medical problem.  Follow these instructions at home:     Pay attention to any changes in your symptoms.   Do not use any products that contain nicotine or tobacco, such as cigarettes, e-cigarettes, and chewing tobacco.   Do not smoke. Smoking is a common cause of shortness of breath. If you need help quitting, ask your health care provider.   Avoid things that can irritate your airways, such as:  ? Mold.  ? Dust.  ? Air pollution.  ? Chemical fumes.  ? Things that can cause allergy symptoms (allergens), if you have allergies.   Keep your living space clean and free of mold and dust.   Rest as needed. Slowly return to your usual activities.   Take over-the-counter and prescription medicines only as told by your health care provider. This includes oxygen therapy and inhaled medicines.   Keep all follow-up visits as told by your health care provider. This is important.  Contact a health care provider if:   Your condition does not improve as soon as expected.   You have a hard time doing your normal activities, even after you rest.   You have new symptoms.  Get help right away if:   Your shortness of breath gets worse.   You have shortness of breath when you are resting.   You feel light-headed or you faint.   You have a cough that is not controlled with medicines.   You cough up blood.   You have pain with breathing.   You have pain in your chest, arms, shoulders, or abdomen.   You have a fever.   You cannot walk up stairs or exercise the way that you normally do.  These symptoms may represent a serious problem that is an emergency. Do not wait to see if the symptoms will go away. Get medical help right away. Call your local emergency services (911 in the [place] Do not drive yourself  to the hospital.  Summary   Shortness of breath is when a person has trouble breathing enough air. It can be a sign of a medical problem.   Avoid things that irritate your lungs, such as smoking, pollution, mold, and dust.   Pay attention to changes in your symptoms and contact your health care provider if you have a hard time completing daily activities because of shortness of breath.  This information is not intended to replace advice given to you by your health care provider. Make sure you discuss any questions you have with your health care provider.  Document Released: 06/29/2001 Document Revised: 03/06/2018 Document Reviewed: 03/06/2018  Elsevier Interactive Patient Education  2019 [employer]

## 2018-11-01 NOTE — Telephone Encounter (Signed)
Copied from [place] [id number]. Topic: Quick Communication - Rx Refill/Question >> Nov 01, 2018 10:44 AM Leward Quan A wrote: Medication: Albuterol Sulfate ([place]) 445 (90 Base) MCG/ACT AEPB  Patient asking for a refill but asking can it be changed to something stronger  Has the patient contacted their pharmacy? Yes.   (Agent: If no, request that the patient contact the pharmacy for the refill.) (Agent: If yes, when and what did the pharmacy advise?)  Preferred Pharmacy (with phone number or street name): [id number] - [place], [place] - [address] [phone] (Phone) [phone] (Fax)    Agent: Please be advised that RX refills may take up to 3 business days. We ask that you follow-up with your pharmacy.

## 2018-11-01 NOTE — Telephone Encounter (Signed)
Called pt. To discuss symptoms.  See triage note.

## 2018-11-01 NOTE — Progress Notes (Signed)
Subjective:     Patient ID: [name], male   DOB: [birth year], 85 y.o.   MRN: [id number]  HPI Patient is seen with progressive shortness of breath he states really over the past few years but particularly the past couple months.  He has long cardiac history.  He has had previous bypass surgery several years ago and has had more had multiple cardiac caths over the years.  He has history of atrial fibrillation, left bundle branch block, hypertension, type 2 diabetes, dyslipidemia, diastolic dysfunction.  Denies any recent cough.  His dyspnea has been recently even at rest and perhaps worse when lying supine and definitely worse with activity.  Has had some fleeting intermittent chest pains but not consistently.  Echocardiogram June 2019 showed ejection fraction 55 to 60%.  He had nuclear stress test 12/18 with fixed inferior defect.  Mild aortic stenosis.  He takes furosemide 40 mg daily.  Has not had any recent increased peripheral edema.  Medications reviewed and compliant with all.  Quit smoking about 60 years ago.  Only smoked briefly.  No known chronic lung disease.  No pleuritic pain  Past Medical History:  Diagnosis Date  . Arthritis   . CAD (coronary artery disease)    a. Cath September 2015 LIMA to the LAD patent, SVG to PDA patent, SVG to posterior lateral patent, SVG to OM with a 90% in-stent restenosis at an anastomotic lesion. This was treated with angioplasty. b. cath 04/01/2015 95% ISR in SVG to OM treated with 2.75x24 Synergy DES postdilated to 3.23mm, all other grafts patent  . Cataract    surgery,B/L  . CHF (congestive heart failure) ([place])   . Chronic kidney disease    nephrolithiasis  . Diabetes mellitus    TYPE 2  . GERD (gastroesophageal reflux disease)   . Heart murmur   . Hernia   . Hypercholesterolemia   . Hypertension   . Incontinence    hx  over 1 year,leaks without awareness  . Other and unspecified diseases of appendix   . PAF (paroxysmal atrial fibrillation)  ([place])    in the setting of ischemia on 03/31/2015  . Pancreatitis   . Prostate CA ([place]) 03/01/04   prostate bx=Adenocarcinoam,gleason 3+4=7,PSA=6.75  . Shortness of breath dyspnea   . Ulcer    hx gastric   Past Surgical History:  Procedure Laterality Date  . APPENDECTOMY    . BACK SURGERY    . CARDIAC CATHETERIZATION N/A 04/01/2015   Procedure: Left Heart Cath and Cors/Grafts Angiography;  Surgeon: Jettie Booze, MD;  Location: [place] [facility];  Service: Cardiovascular;  Laterality: N/A;  . CARDIAC CATHETERIZATION N/A 04/01/2015   Procedure: Coronary Stent Intervention;  Surgeon: Jettie Booze, MD;  Location: [place] [facility];  Service: Cardiovascular;  Laterality: N/A;  . CARPAL TUNNEL RELEASE  2004   both hands  . CORONARY ARTERY BYPASS GRAFT    . CYSTOSCOPY  08/23/12   incomplete emoptying bladder  . HERNIA REPAIR    . incision and drainage of right chest abscess    . LAPAROSCOPIC CHOLECYSTECTOMY  09/2009  . LEFT HEART CATHETERIZATION WITH CORONARY ANGIOGRAM N/A 07/19/2014   Procedure: LEFT HEART CATHETERIZATION WITH CORONARY ANGIOGRAM;  Surgeon: Leonie Man, MD;  Location: [facility];  Service: Cardiovascular;  Laterality: N/A;  . RECTAL SURGERY    . ROBOT ASSISTED LAPAROSCOPIC RADICAL PROSTATECTOMY  2005    reports that he quit smoking about 46 years ago. His smoking use included  cigarettes. He has a 2.50 pack-year smoking history. He has never used smokeless tobacco. He reports that he does not drink alcohol or use drugs. family history includes Cancer in his mother. Allergies  Allergen Reactions  . Codeine   . Morphine     REACTION: does not like     Review of Systems  Constitutional: Positive for fatigue. Negative for appetite change and unexpected weight change.  Respiratory: Positive for shortness of breath. Negative for cough and wheezing.   Cardiovascular: Negative for chest pain, palpitations and leg swelling.  Gastrointestinal: Negative for  abdominal pain.  Neurological: Negative for syncope and weakness.       Objective:   Physical Exam Constitutional:      Appearance: He is well-developed.  Cardiovascular:     Rate and Rhythm: Normal rate and regular rhythm.  Pulmonary:     Effort: Pulmonary effort is normal. No respiratory distress.     Breath sounds: Normal breath sounds. No decreased breath sounds, wheezing or rales.     Comments: Pulse oximetry 97% Musculoskeletal:     Right lower leg: No edema.     Left lower leg: No edema.  Neurological:     Mental Status: He is alert.        Assessment:     Patient presents with a few year history of progressive dyspnea on exertion and now some dyspnea even at rest.  He has extensive cardiac history as above with remote history of bypass but no systolic dysfunction by most recent echo.  Does not appear to have any overt pulmonary edema by exam today.  His weight is stable.  No peripheral edema.  No rales.    Plan:     -Check EKG- shows LBBB with no acute changes. -Obtain chest x-ray -Check further labs with CBC, basic chemistries, BNP level -Consider referral back to cardiology to see if they have any further thoughts  [name] MD [facility]

## 2018-11-01 NOTE — Telephone Encounter (Signed)
Noted, Patient has arrived for appointment.

## 2018-11-01 NOTE — Telephone Encounter (Signed)
Returned call to pt. following his request for refill on "something stronger" than the Albuterol Inhaler.  Stated "it's harder to get my breath over past 2 mos. with activity."  Stated he gets short of breath with activity of walking to his shed, taking a shower, etc.  Reported he recovers easily after resting approx. 10 minutes.  Denied chest pain or chest heaviness associated with shortness of breath.  Reported he has very short episodes (a few seconds)  of "sharp" chest pain at intervals.  Uses Ntg. tablets if the chest discomfort persists.  Denied nausea, sweating, or radiation of chest pain.  Denied cough.  Advised will need an appt. to eval. need for further treatment.  Pt. Agreed with plan.  Reported plans to go to beach on Saturday.  Appt. Sched. Today at 3:00 PM with PCP.  Care advice given per protocol. Verb. Understanding.    Reason for Disposition . [1] MODERATE longstanding difficulty breathing (e.g., speaks in phrases, SOB even at rest, pulse 100-120) AND [2] SAME as normal    Reported has had increased shortness of breath with activity, in last few months.  Recovers easily after resting for approx. 10 minutes.  Denied chest pain associated with the shortness of breath.  Answer Assessment - Initial Assessment Questions 1. RESPIRATORY STATUS: "Describe your breathing?" (e.g., wheezing, shortness of breath, unable to speak, severe coughing)      It's harder to get my breath over past few months, with activity  2. ONSET: "When did this breathing problem begin?"     Over 2 mos.  3. PATTERN "Does the difficult breathing come and go, or has it been constant since it started?"      intermittent 4. SEVERITY: "How bad is your breathing?" (e.g., mild, moderate, severe)    - MILD: No SOB at rest, mild SOB with walking, speaks normally in sentences, can lay down, no retractions, pulse < 100.    - MODERATE: SOB at rest, SOB with minimal exertion and prefers to sit, cannot lie down flat, speaks in  phrases, mild retractions, audible wheezing, pulse 100-120.    - SEVERE: Very SOB at rest, speaks in single words, struggling to breathe, sitting hunched forward, retractions, pulse > 120      Moderate with activity; able to lay flat at night 5. RECURRENT SYMPTOM: "Have you had difficulty breathing before?" If so, ask: "When was the last time?" and "What happened that time?"      yes 6. CARDIAC HISTORY: "Do you have any history of heart disease?" (e.g., heart attack, angina, bypass surgery, angioplasty)     Hx of MI; Hx of CABG x 4; cardiac stents   7. LUNG HISTORY: "Do you have any history of lung disease?"  (e.g., pulmonary embolus, asthma, emphysema)     Denied lung disease 8. CAUSE: "What do you think is causing the breathing problem?"      Unknown  9. OTHER SYMPTOMS: "Do you have any other symptoms? (e.g., dizziness, runny nose, cough, chest pain, fever)     Reported intermittent "sharp pain" in chest; lasts a second or two; denied sweating or nausea.  Stated chest pain occurs every week or two.   10. PREGNANCY: "Is there any chance you are pregnant?" "When was your last menstrual period?"       N/a  11. TRAVEL: "Have you traveled out of the country in the last month?" (e.g., travel history, exposures)       N/a  Protocols used: BREATHING DIFFICULTY-A-AH

## 2018-11-02 LAB — CBC WITH DIFFERENTIAL/PLATELET
BASOS PCT: 1.1 % (ref 0.0–3.0)
Basophils Absolute: 0.1 10*3/uL (ref 0.0–0.1)
Eosinophils Absolute: 0.3 10*3/uL (ref 0.0–0.7)
Eosinophils Relative: 2.6 % (ref 0.0–5.0)
HEMATOCRIT: 43.2 % (ref 39.0–52.0)
Hemoglobin: 15.1 g/dL (ref 13.0–17.0)
Lymphocytes Relative: 18.1 % (ref 12.0–46.0)
Lymphs Abs: 2.1 10*3/uL (ref 0.7–4.0)
MCHC: 34.8 g/dL (ref 30.0–36.0)
MCV: 91.4 fl (ref 78.0–100.0)
MONOS PCT: 7.9 % (ref 3.0–12.0)
Monocytes Absolute: 0.9 10*3/uL (ref 0.1–1.0)
NEUTROS ABS: 8 10*3/uL — AB (ref 1.4–7.7)
Neutrophils Relative %: 70.3 % (ref 43.0–77.0)
PLATELETS: 171 10*3/uL (ref 150.0–400.0)
RBC: 4.73 Mil/uL (ref 4.22–5.81)
RDW: 12.9 % (ref 11.5–15.5)
WBC: 11.4 10*3/uL — ABNORMAL HIGH (ref 4.0–10.5)

## 2018-11-02 LAB — BASIC METABOLIC PANEL
BUN: 29 mg/dL — AB (ref 6–23)
CHLORIDE: 101 meq/L (ref 96–112)
CO2: 26 mEq/L (ref 19–32)
Calcium: 10.3 mg/dL (ref 8.4–10.5)
Creatinine, Ser: 1.99 mg/dL — ABNORMAL HIGH (ref 0.40–1.50)
GFR: 34.23 mL/min — AB (ref >60.00)
GLUCOSE: 146 mg/dL — AB (ref 70–99)
Potassium: 4.3 mEq/L (ref 3.5–5.1)
Sodium: 135 mEq/L (ref 135–145)

## 2018-11-02 LAB — BRAIN NATRIURETIC PEPTIDE: PRO B NATRI PEPTIDE: 191 pg/mL — AB (ref 0.0–100.0)

## 2018-11-10 ENCOUNTER — Other Ambulatory Visit: Payer: Self-pay

## 2018-11-10 ENCOUNTER — Ambulatory Visit: Payer: Medicare Other | Admitting: Family Medicine

## 2018-11-10 ENCOUNTER — Encounter: Payer: Self-pay | Admitting: Family Medicine

## 2018-11-10 ENCOUNTER — Ambulatory Visit (INDEPENDENT_AMBULATORY_CARE_PROVIDER_SITE_OTHER): Payer: Medicare Other

## 2018-11-10 VITALS — BP 104/62 | HR 59 | Temp 97.8°F | Ht 69.0 in | Wt 185.7 lb

## 2018-11-10 DIAGNOSIS — R9389 Abnormal findings on diagnostic imaging of other specified body structures: Secondary | ICD-10-CM

## 2018-11-10 NOTE — Progress Notes (Signed)
Subjective:     Patient ID: [name], male   DOB: June 06, 1933, 85 y.o.   MRN: [id number]  HPI Patient here for follow-up regarding recent visit for dyspnea.  We obtained labs and his BNP level was only minimally elevated 191.  Chest x-ray showed right upper lobe density.  This was at the level of the anterior aspect of the right first rib-question of prominent costochondral calcification but cannot rule out underlying infiltrate or mass.  Recommendation was for follow-up PA lateral and apical lordotic chest x-ray.  Patient symptoms are unchanged.  Is not any recent cough.  No fever.  No chills.  No weight loss.  Patient quit smoking 1973  Past Medical History:  Diagnosis Date  . Arthritis   . CAD (coronary artery disease)    a. Cath September 2015 LIMA to the LAD patent, SVG to PDA patent, SVG to posterior lateral patent, SVG to OM with a 90% in-stent restenosis at an anastomotic lesion. This was treated with angioplasty. b. cath 04/01/2015 95% ISR in SVG to OM treated with 2.75x24 Synergy DES postdilated to 3.55mm, all other grafts patent  . Cataract    surgery,B/L  . CHF (congestive heart failure) ([place])   . Chronic kidney disease    nephrolithiasis  . Diabetes mellitus    TYPE 2  . GERD (gastroesophageal reflux disease)   . Heart murmur   . Hernia   . Hypercholesterolemia   . Hypertension   . Incontinence    hx  over 1 year,leaks without awareness  . Other and unspecified diseases of appendix   . PAF (paroxysmal atrial fibrillation) ([place])    in the setting of ischemia on 03/31/2015  . Pancreatitis   . Prostate CA ([place]) 03/01/04   prostate bx=Adenocarcinoam,gleason 3+4=7,PSA=6.75  . Shortness of breath dyspnea   . Ulcer    hx gastric   Past Surgical History:  Procedure Laterality Date  . APPENDECTOMY    . BACK SURGERY    . CARDIAC CATHETERIZATION N/A 04/01/2015   Procedure: Left Heart Cath and Cors/Grafts Angiography;  Surgeon: Jettie Booze, MD;  Location: [place]  [facility];  Service: Cardiovascular;  Laterality: N/A;  . CARDIAC CATHETERIZATION N/A 04/01/2015   Procedure: Coronary Stent Intervention;  Surgeon: Jettie Booze, MD;  Location: [place] [facility];  Service: Cardiovascular;  Laterality: N/A;  . CARPAL TUNNEL RELEASE  2004   both hands  . CORONARY ARTERY BYPASS GRAFT    . CYSTOSCOPY  08/23/12   incomplete emoptying bladder  . HERNIA REPAIR    . incision and drainage of right chest abscess    . LAPAROSCOPIC CHOLECYSTECTOMY  09/2009  . LEFT HEART CATHETERIZATION WITH CORONARY ANGIOGRAM N/A 07/19/2014   Procedure: LEFT HEART CATHETERIZATION WITH CORONARY ANGIOGRAM;  Surgeon: Leonie Man, MD;  Location: [facility];  Service: Cardiovascular;  Laterality: N/A;  . RECTAL SURGERY    . ROBOT ASSISTED LAPAROSCOPIC RADICAL PROSTATECTOMY  2005    reports that he quit smoking about 46 years ago. His smoking use included cigarettes. He has a 2.50 pack-year smoking history. He has never used smokeless tobacco. He reports that he does not drink alcohol or use drugs. family history includes Cancer in his mother. Allergies  Allergen Reactions  . Codeine   . Morphine     REACTION: does not like     Review of Systems  Constitutional: Negative for appetite change, chills, fever and unexpected weight change.  Respiratory: Positive for shortness of breath.  Negative for cough and wheezing.   Cardiovascular: Negative for chest pain and leg swelling.       Objective:   Physical Exam Constitutional:      Appearance: Normal appearance.  Cardiovascular:     Rate and Rhythm: Normal rate and regular rhythm.  Pulmonary:     Effort: Pulmonary effort is normal.     Breath sounds: Normal breath sounds. No wheezing or rales.  Musculoskeletal:     Right lower leg: No edema.     Left lower leg: No edema.  Neurological:     Mental Status: He is alert.        Assessment:     Recent abnormal chest x-ray as above.  He does not have any red flags such  as chronic cough, hemoptysis, fever, weight loss, etc.  He has chronic dyspnea which is unchanged from last visit    Plan:     -We will recheck apical lordotic view as per requested by radiology  [name] MD [facility]

## 2018-11-16 NOTE — Progress Notes (Signed)
HPI The patient presents for followup of CAD.   He had chest pain and had a cardiac catheterization on 04/01/2015 which showed a 95% in-stent restenosis in SVG to OM, treated with 2.75 x 24 mm Synergy DES postdilated to 3.3 mm. He had patent LIMA to LAD, patent SVG to posterolateral, patent SVG to PDA.    Of note he did have atrial fibrillation with rate control on presentation but he converted spontaneously. He was not started on warfarin because he was taking aspirin and Plavix.  He has had no symptomatic recurrence of this arrhythmia.   He wore an event monitor and there was no evidence of atrial fib.  He was having SOB in Dec 2018.  On [place] he had a fixed inferior defect. He returns for follow up.    He continues to have shortness of breath.  He is referred back by [name], MD as there is no apparent etiology.  He has had a slightly elevated BNP but had no improvement with diuretics.  His last BNP though still slightly elevated was better than previous.  Chest x-ray demonstrated no acute disease.  Yet he reports being short of breath with any activity.  He walks less than 100 yards in his yard and he is breathless.  He says he showers and is breathless.  We walked him around the office today.  His oxygen level started in the high 90s and only went down to 95% he had he was very dyspneic.  He did have what appeared to be an adequate heart rate response going from the 60s to the 90s.  He has some atypical chest discomfort that is unchanged from symptoms he had back in 2018 when he had a fixed inferior defect on stress perfusion imaging.  He is not describing PND or orthopnea.  He is not describing substernal chest pain.  He is not had new edema.   Allergies  Allergen Reactions  . Codeine   . Morphine     REACTION: does not like    Current Outpatient Medications  Medication Sig Dispense Refill  . amLODipine (NORVASC) 10 MG tablet TAKE 1 TABLET BY MOUTH EVERY DAY 90  tablet 0  . carvedilol (COREG) 12.5 MG tablet TAKE 1 TABLET BY MOUTH TWICE A DAY WITH A MEAL 180 tablet 4  . clopidogrel (PLAVIX) 75 MG tablet Take 1 tablet (75 mg total) by mouth daily. TAKE 1 TABLET BY MOUTH EVERY DAY 90 tablet 1  . furosemide (LASIX) 40 MG tablet TAKE 1 TABLET BY MOUTH EVERY DAY 90 tablet 2  . glucose blood (FREESTYLE LITE) test strip 1 each by Other route as needed. Use as instructed     . JANUVIA 50 MG tablet TAKE 1 TABLET BY MOUTH EVERY DAY 90 tablet 2  . Lancets (FREESTYLE) lancets 1 each by Other route as needed. Use as instructed     . nitroGLYCERIN (NITROSTAT) 0.4 MG SL tablet Place 1 tablet (0.4 mg total) under the tongue every 5 (five) minutes x 3 doses as needed for chest pain (if no relief after 3rd dose, proceed to the ED for an evaluation). 75 tablet 1  . pantoprazole (PROTONIX) 40 MG tablet TAKE 1 TABLET (40 MG TOTAL) BY MOUTH DAILY. 90 tablet 1  . rosuvastatin (CRESTOR) 20 MG tablet TAKE 1 TABLET BY MOUTH EVERY DAY 90 tablet 2  . spironolactone (ALDACTONE) 25 MG tablet Take 1 tablet (25 mg total) by mouth daily. 90 tablet  3  . traMADol (ULTRAM) 50 MG tablet Take 1 tablet (50 mg total) by mouth every 6 (six) hours as needed. 1-2 every 6 h prn pain 120 tablet 5   No current facility-administered medications for this visit.     Past Medical History:  Diagnosis Date  . Arthritis   . CAD (coronary artery disease)    a. Cath September 2015 LIMA to the LAD patent, SVG to PDA patent, SVG to posterior lateral patent, SVG to OM with a 90% in-stent restenosis at an anastomotic lesion. This was treated with angioplasty. b. cath 04/01/2015 95% ISR in SVG to OM treated with 2.75x24 Synergy DES postdilated to 3.25mm, all other grafts patent  . Cataract    surgery,B/L  . CHF (congestive heart failure) ([place])   . Chronic kidney disease    nephrolithiasis  . Diabetes mellitus    TYPE 2  . GERD (gastroesophageal reflux disease)   . Heart murmur   . Hernia   .  Hypercholesterolemia   . Hypertension   . Incontinence    hx  over 1 year,leaks without awareness  . Other and unspecified diseases of appendix   . PAF (paroxysmal atrial fibrillation) ([place])    in the setting of ischemia on 03/31/2015  . Pancreatitis   . Prostate CA ([place]) 03/01/04   prostate bx=Adenocarcinoam,gleason 3+4=7,PSA=6.75  . Shortness of breath dyspnea   . Ulcer    hx gastric    Past Surgical History:  Procedure Laterality Date  . APPENDECTOMY    . BACK SURGERY    . CARDIAC CATHETERIZATION N/A 04/01/2015   Procedure: Left Heart Cath and Cors/Grafts Angiography;  Surgeon: Jettie Booze, MD;  Location: [place] [facility];  Service: Cardiovascular;  Laterality: N/A;  . CARDIAC CATHETERIZATION N/A 04/01/2015   Procedure: Coronary Stent Intervention;  Surgeon: Jettie Booze, MD;  Location: [place] [facility];  Service: Cardiovascular;  Laterality: N/A;  . CARPAL TUNNEL RELEASE  2004   both hands  . CORONARY ARTERY BYPASS GRAFT    . CYSTOSCOPY  08/23/12   incomplete emoptying bladder  . HERNIA REPAIR    . incision and drainage of right chest abscess    . LAPAROSCOPIC CHOLECYSTECTOMY  09/2009  . LEFT HEART CATHETERIZATION WITH CORONARY ANGIOGRAM N/A 07/19/2014   Procedure: LEFT HEART CATHETERIZATION WITH CORONARY ANGIOGRAM;  Surgeon: Leonie Man, MD;  Location: [facility] CATH LAB;  Service: Cardiovascular;  Laterality: N/A;  . RECTAL SURGERY    . ROBOT ASSISTED LAPAROSCOPIC RADICAL PROSTATECTOMY  2005    ROS: As stated in the HPI and negative for all other systems.  PHYSICAL EXAM BP (!) 152/75   Pulse 65   Ht 5\' 9"  (1.753 m)   Wt 185 lb 9.6 oz (84.2 kg)   BMI 27.41 kg/m   GENERAL:  Well appearing NECK:  No jugular venous distention, waveform within normal limits, carotid upstroke brisk and symmetric, no bruits, no thyromegaly LUNGS:  Clear to auscultation bilaterally CHEST:  Well healed sternotomy scar. HEART:  PMI not displaced or sustained,S1 and S2 within  normal limits, no S3, no S4, no clicks, no rubs, 2 out of 6 apical systolic murmur radiating slightly out aortic outflow tract, no diastolic murmurs ABD:  Flat, positive bowel sounds normal in frequency in pitch, no bruits, no rebound, no guarding, no midline pulsatile mass, no hepatomegaly, no splenomegaly EXT:  2 plus pulses throughout, trace leg edema, no cyanosis no clubbing   EKG: NA   Lab Results  Component Value Date   CHOL 111 03/14/2018   TRIG 137.0 03/14/2018   HDL 34.90 (L) 03/14/2018   LDLCALC 49 03/14/2018   LDLDIRECT 143.3 11/23/2007   Lab Results  Component Value Date   HGBA1C 6.6 (A) 09/13/2018   Lab Results  Component Value Date   CREATININE 1.99 (H) 11/01/2018   Lab Results  Component Value Date   HGBA1C 6.6 (A) 09/13/2018     ASSESSMENT AND PLAN   CORONARY ARTERY DISEASE  He had a negative ischemia work-up last year.  He has atypical chest pain now.  I do not suspect ischemia as etiology but I will be evaluating this as below.   Atrial fib He has had no symptomatic or documented recurrence since he was in the hospital a couple of years ago.  No change in therapy.  He has worn a monitor on a couple of the occasions and has had no documented arrhythmias.  Hypertension  The blood pressure is at target.  No change in therapy.   AS:   He had mild AS on echo in June.  I do not suspect this is an etiology though he might eventually need right heart and left heart catheterization.   Carotid stenosis-Bilateral  He has moderate bilateral disease in June.  He will have this followed up again in June  CKD: His creatinine was 1.99.  We can continue to follow this.  DYSPNEA:   This has been somewhat of a diagnostic challenge.  He is very dyspneic walking around the office though his oxygen sat never falls below 95%.  At this point I would like to try cardiopulmonary stress testing to see if there is any indications of a primary pulmonary etiology.  I do not  feel strongly that this is an ischemic etiology but would consider cardiac cath.  I do not think this is chronotropic incompetence.  However, we can assess his heart rate response with exercise during this test.

## 2018-11-17 ENCOUNTER — Ambulatory Visit: Payer: Medicare Other | Admitting: Cardiology

## 2018-11-17 ENCOUNTER — Encounter: Payer: Self-pay | Admitting: Cardiology

## 2018-11-17 VITALS — BP 152/75 | HR 65 | Ht 69.0 in | Wt 185.6 lb

## 2018-11-17 DIAGNOSIS — I350 Nonrheumatic aortic (valve) stenosis: Secondary | ICD-10-CM

## 2018-11-17 DIAGNOSIS — I2510 Atherosclerotic heart disease of native coronary artery without angina pectoris: Secondary | ICD-10-CM | POA: Diagnosis not present

## 2018-11-17 DIAGNOSIS — N183 Chronic kidney disease, stage 3 unspecified: Secondary | ICD-10-CM

## 2018-11-17 DIAGNOSIS — R0602 Shortness of breath: Secondary | ICD-10-CM | POA: Diagnosis not present

## 2018-11-17 NOTE — Patient Instructions (Signed)
Medication Instructions:  Continue current medications  If you need a refill on your cardiac medications before your next appointment, please call your pharmacy.  Labwork: None Ordered   Take the provided lab slips with you to the lab for your blood draw.   When you have your labs (blood work) drawn today and your tests are completely normal, you will receive your results only by MyChart Message (if you have MyChart) -OR-  A paper copy in the mail.  If you have any lab test that is abnormal or we need to change your treatment, we will call you to review these results.  Testing/Procedures: Your physician has recommended that you have a cardiopulmonary stress test (CPX). CPX testing is a non-invasive measurement of heart and lung function. It replaces a traditional treadmill stress test. This type of test provides a tremendous amount of information that relates not only to your present condition but also for future outcomes. This test combines measurements of you ventilation, respiratory gas exchange in the lungs, electrocardiogram (EKG), blood pressure and physical response before, during, and following an exercise protocol.  Follow-Up: . Your physician recommends that you schedule a follow-up appointment in: After CPX   At [facility], you and your health needs are our priority.  As part of our continuing mission to provide you with exceptional heart care, we have created designated Provider Care Teams.  These Care Teams include your primary Cardiologist (physician) and Advanced Practice Providers (APPs -  Physician Assistants and Nurse Practitioners) who all work together to provide you with the care you need, when you need it.   Thank you for choosing [facility] at [facility]!!

## 2018-11-23 ENCOUNTER — Ambulatory Visit (HOSPITAL_COMMUNITY): Payer: Medicare Other | Attending: Internal Medicine

## 2018-11-23 DIAGNOSIS — E7800 Pure hypercholesterolemia, unspecified: Secondary | ICD-10-CM | POA: Insufficient documentation

## 2018-11-23 DIAGNOSIS — K219 Gastro-esophageal reflux disease without esophagitis: Secondary | ICD-10-CM | POA: Insufficient documentation

## 2018-11-23 DIAGNOSIS — N189 Chronic kidney disease, unspecified: Secondary | ICD-10-CM | POA: Insufficient documentation

## 2018-11-23 DIAGNOSIS — I2510 Atherosclerotic heart disease of native coronary artery without angina pectoris: Secondary | ICD-10-CM | POA: Insufficient documentation

## 2018-11-23 DIAGNOSIS — R0602 Shortness of breath: Secondary | ICD-10-CM

## 2018-11-23 DIAGNOSIS — Z87891 Personal history of nicotine dependence: Secondary | ICD-10-CM | POA: Diagnosis not present

## 2018-11-23 DIAGNOSIS — I130 Hypertensive heart and chronic kidney disease with heart failure and stage 1 through stage 4 chronic kidney disease, or unspecified chronic kidney disease: Secondary | ICD-10-CM | POA: Insufficient documentation

## 2018-11-23 DIAGNOSIS — Z8546 Personal history of malignant neoplasm of prostate: Secondary | ICD-10-CM | POA: Insufficient documentation

## 2018-11-23 DIAGNOSIS — I509 Heart failure, unspecified: Secondary | ICD-10-CM | POA: Diagnosis not present

## 2018-11-23 DIAGNOSIS — E1122 Type 2 diabetes mellitus with diabetic chronic kidney disease: Secondary | ICD-10-CM | POA: Diagnosis not present

## 2018-12-02 NOTE — Progress Notes (Addendum)
HPI The patient presents for followup of CAD.   He had chest pain and had a cardiac catheterization on 04/01/2015 which showed a 95% in-stent restenosis in SVG to OM, treated with 2.75 x 24 mm Synergy DES postdilated to 3.3 mm. He had patent LIMA to LAD, patent SVG to posterolateral, patent SVG to PDA.    Of note he did have atrial fibrillation with rate control on presentation but he converted spontaneously. He was not started on warfarin because he was taking aspirin and Plavix.  He has had no symptomatic recurrence of this arrhythmia.   He wore an event monitor and there was no evidence of atrial fib.  He was having SOB in Dec 2018.  On [place] he had a fixed inferior defect. He returned for follow up and was complaining of dyspnea.  He had a CPX which did not demonstrate any acute cardiovascular complaints.  However, he had a submaximal effort.  Walking him around the office at the last visit was SOB out of proportion to his complaints of dyspnea.  Of note the most prevalent finding on this study that I reviewed for this visit was his low heart rate response to activity.  He continues to complain of dyspnea with mild activity.  He is not describing any resting complaints.  He is not having any new chest pressure, neck or arm discomfort.   Allergies  Allergen Reactions  . Codeine   . Morphine     REACTION: does not like    Current Outpatient Medications  Medication Sig Dispense Refill  . amLODipine (NORVASC) 10 MG tablet TAKE 1 TABLET BY MOUTH EVERY DAY 90 tablet 0  . carvedilol (COREG) 6.25 MG tablet Take 1 tablet (6.25 mg total) by mouth 2 (two) times daily with a meal. 180 tablet 3  . clopidogrel (PLAVIX) 75 MG tablet Take 1 tablet (75 mg total) by mouth daily. TAKE 1 TABLET BY MOUTH EVERY DAY 90 tablet 1  . furosemide (LASIX) 40 MG tablet TAKE 1 TABLET BY MOUTH EVERY DAY 90 tablet 2  . glucose blood (FREESTYLE LITE) test strip 1 each by Other route as needed. Use as  instructed     . JANUVIA 50 MG tablet TAKE 1 TABLET BY MOUTH EVERY DAY 90 tablet 2  . Lancets (FREESTYLE) lancets 1 each by Other route as needed. Use as instructed     . nitroGLYCERIN (NITROSTAT) 0.4 MG SL tablet Place 1 tablet (0.4 mg total) under the tongue every 5 (five) minutes x 3 doses as needed for chest pain (if no relief after 3rd dose, proceed to the ED for an evaluation). 75 tablet 1  . pantoprazole (PROTONIX) 40 MG tablet TAKE 1 TABLET (40 MG TOTAL) BY MOUTH DAILY. 90 tablet 1  . rosuvastatin (CRESTOR) 20 MG tablet TAKE 1 TABLET BY MOUTH EVERY DAY 90 tablet 2  . spironolactone (ALDACTONE) 25 MG tablet Take 1 tablet (25 mg total) by mouth daily. 90 tablet 3  . traMADol (ULTRAM) 50 MG tablet Take 1 tablet (50 mg total) by mouth every 6 (six) hours as needed. 1-2 every 6 h prn pain 120 tablet 5   No current facility-administered medications for this visit.     Past Medical History:  Diagnosis Date  . Arthritis   . CAD (coronary artery disease)    a. Cath September 2015 LIMA to the LAD patent, SVG to PDA patent, SVG to posterior lateral patent, SVG to OM with a 90% in-stent  restenosis at an anastomotic lesion. This was treated with angioplasty. b. cath 04/01/2015 95% ISR in SVG to OM treated with 2.75x24 Synergy DES postdilated to 3.38mm, all other grafts patent  . Cataract    surgery,B/L  . CHF (congestive heart failure) ([place])   . Chronic kidney disease    nephrolithiasis  . Diabetes mellitus    TYPE 2  . GERD (gastroesophageal reflux disease)   . Heart murmur   . Hernia   . Hypercholesterolemia   . Hypertension   . Incontinence    hx  over 1 year,leaks without awareness  . Other and unspecified diseases of appendix   . PAF (paroxysmal atrial fibrillation) ([place])    in the setting of ischemia on 03/31/2015  . Pancreatitis   . Prostate CA ([place]) 03/01/04   prostate bx=Adenocarcinoam,gleason 3+4=7,PSA=6.75  . Shortness of breath dyspnea   . Ulcer    hx gastric    Past  Surgical History:  Procedure Laterality Date  . APPENDECTOMY    . BACK SURGERY    . CARDIAC CATHETERIZATION N/A 04/01/2015   Procedure: Left Heart Cath and Cors/Grafts Angiography;  Surgeon: Jettie Booze, MD;  Location: [place] [facility];  Service: Cardiovascular;  Laterality: N/A;  . CARDIAC CATHETERIZATION N/A 04/01/2015   Procedure: Coronary Stent Intervention;  Surgeon: Jettie Booze, MD;  Location: [place] [facility];  Service: Cardiovascular;  Laterality: N/A;  . CARPAL TUNNEL RELEASE  2004   both hands  . CORONARY ARTERY BYPASS GRAFT    . CYSTOSCOPY  08/23/12   incomplete emoptying bladder  . HERNIA REPAIR    . incision and drainage of right chest abscess    . LAPAROSCOPIC CHOLECYSTECTOMY  09/2009  . LEFT HEART CATHETERIZATION WITH CORONARY ANGIOGRAM N/A 07/19/2014   Procedure: LEFT HEART CATHETERIZATION WITH CORONARY ANGIOGRAM;  Surgeon: Leonie Man, MD;  Location: [facility] CATH LAB;  Service: Cardiovascular;  Laterality: N/A;  . RECTAL SURGERY    . ROBOT ASSISTED LAPAROSCOPIC RADICAL PROSTATECTOMY  2005    ROS:    As stated in the HPI and negative for all other systems.  PHYSICAL EXAM BP (!) 122/58 (BP Location: Left Arm, Patient Position: Sitting, Cuff Size: Normal)   Pulse 60   Ht 5\' 9"  (1.753 m)   Wt 185 lb (83.9 kg)   BMI 27.32 kg/m   GENERAL:  Well appearing NECK:  No jugular venous distention, waveform within normal limits, carotid upstroke brisk and symmetric, positive bruits, no thyromegaly LUNGS:  Clear to auscultation bilaterally CHEST:  Well healed sternotomy scar. HEART:  PMI not displaced or sustained,S1 and S2 within normal limits, no S3, no S4, no clicks, no rubs, 3 out of 6 apical systolic murmur radiating slightly at aortic outflow tract, no diastolic murmurs ABD:  Flat, positive bowel sounds normal in frequency in pitch, no bruits, no rebound, no guarding, no midline pulsatile mass, no hepatomegaly, no splenomegaly EXT:  2 plus pulses  throughout, no edema, no cyanosis no clubbing   EKG:   NA   Lab Results  Component Value Date   CHOL 111 03/14/2018   TRIG 137.0 03/14/2018   HDL 34.90 (L) 03/14/2018   LDLCALC 49 03/14/2018   LDLDIRECT 143.3 11/23/2007   Lab Results  Component Value Date   HGBA1C 6.6 (A) 09/13/2018   Lab Results  Component Value Date   CREATININE 1.99 (H) 11/01/2018   Lab Results  Component Value Date   HGBA1C 6.6 (A) 09/13/2018     ASSESSMENT  AND PLAN   CORONARY ARTERY DISEASE  The patient has no new sypmtoms.  No further cardiovascular testing is indicated.  We will continue with aggressive risk reduction and meds as listed.  He had a negative ischemia work-up.  I do not think further evaluation is indicated.  Atrial fib He has had no symptomatic documented recurrence of this.  No change in therapy other than as below.  Hypertension  The blood pressure is at target but I will be making med changes as below.   AS:   He had mild AS on echo in June 2019.  I will follow this clinically  Carotid stenosis-Bilateral  He has moderate bilateral disease in June   CKD: His creatinine was 1.99.   We will follow BMETs  DYSPNEA:   The etiology of this is not clear.  Again he has had more symptoms then would be suspected from the saturations in the mid 90s obtained when he was ambulating.  His CPX demonstrated a reasonable functional level although was limited by his effort.  The only significant objective finding was bradycardia and possible chronotropic incompetence along a start by reducing his beta-blocker and may need to discontinue it altogether.  If this does not improve his symptoms he might need to be referred to pulmonary.

## 2018-12-04 ENCOUNTER — Encounter: Payer: Self-pay | Admitting: Cardiology

## 2018-12-05 ENCOUNTER — Ambulatory Visit: Payer: Medicare Other | Admitting: Cardiology

## 2018-12-05 ENCOUNTER — Encounter: Payer: Self-pay | Admitting: Cardiology

## 2018-12-05 VITALS — BP 122/58 | HR 60 | Ht 69.0 in | Wt 185.0 lb

## 2018-12-05 DIAGNOSIS — I2510 Atherosclerotic heart disease of native coronary artery without angina pectoris: Secondary | ICD-10-CM | POA: Diagnosis not present

## 2018-12-05 DIAGNOSIS — I6523 Occlusion and stenosis of bilateral carotid arteries: Secondary | ICD-10-CM

## 2018-12-05 DIAGNOSIS — N183 Chronic kidney disease, stage 3 unspecified: Secondary | ICD-10-CM

## 2018-12-05 DIAGNOSIS — I10 Essential (primary) hypertension: Secondary | ICD-10-CM | POA: Diagnosis not present

## 2018-12-05 DIAGNOSIS — R0602 Shortness of breath: Secondary | ICD-10-CM | POA: Diagnosis not present

## 2018-12-05 MED ORDER — CARVEDILOL 6.25 MG PO TABS: 6.2500 mg | ORAL_TABLET | Freq: Two times a day (BID) | ORAL | 3 refills | Status: DC

## 2018-12-05 NOTE — Patient Instructions (Signed)
Medication Instructions:  DECREASE- Carvedilol 6.25 mg twice a day  If you need a refill on your cardiac medications before your next appointment, please call your pharmacy.  Labwork: None Ordered   Testing/Procedures: None Ordered   Follow-Up: You will need a follow up appointment in 1 Year.  Please call our office 2 months in advance to schedule this appointment.  You may see 2Dr Hochrein or one of the following Advanced Practice Providers on your designated Care Team:   Rosaria Ferries, PA-C . Jory Sims, DNP, ANP    At [facility], you and your health needs are our priority.  As part of our continuing mission to provide you with exceptional heart care, we have created designated Provider Care Teams.  These Care Teams include your primary Cardiologist (physician) and Advanced Practice Providers (APPs -  Physician Assistants and Nurse Practitioners) who all work together to provide you with the care you need, when you need it.  Thank you for choosing [facility] at [facility]!!

## 2018-12-27 ENCOUNTER — Other Ambulatory Visit: Payer: Self-pay | Admitting: Family Medicine

## 2019-01-24 ENCOUNTER — Other Ambulatory Visit: Payer: Self-pay | Admitting: Family Medicine

## 2019-01-26 ENCOUNTER — Other Ambulatory Visit: Payer: Self-pay | Admitting: Cardiology

## 2019-01-26 ENCOUNTER — Other Ambulatory Visit: Payer: Self-pay | Admitting: Family Medicine

## 2019-01-26 NOTE — Telephone Encounter (Signed)
Furosemide refilled. 

## 2019-02-09 ENCOUNTER — Other Ambulatory Visit: Payer: Self-pay | Admitting: Family Medicine

## 2019-02-16 ENCOUNTER — Other Ambulatory Visit (HOSPITAL_COMMUNITY): Payer: Self-pay | Admitting: Urology

## 2019-02-16 ENCOUNTER — Other Ambulatory Visit: Payer: Self-pay | Admitting: Urology

## 2019-02-16 DIAGNOSIS — C61 Malignant neoplasm of prostate: Secondary | ICD-10-CM

## 2019-02-28 ENCOUNTER — Encounter (HOSPITAL_COMMUNITY)
Admission: RE | Admit: 2019-02-28 | Discharge: 2019-02-28 | Disposition: A | Discharge status: Home / Self Care | Payer: Medicare Other | Source: Ambulatory Visit | Attending: Urology | Admitting: Urology

## 2019-02-28 ENCOUNTER — Ambulatory Visit (HOSPITAL_COMMUNITY)
Admission: RE | Admit: 2019-02-28 | Discharge: 2019-02-28 | Disposition: A | Discharge status: Home / Self Care | Payer: Medicare Other | Source: Ambulatory Visit | Attending: Urology | Admitting: Urology

## 2019-02-28 ENCOUNTER — Other Ambulatory Visit: Payer: Self-pay

## 2019-02-28 DIAGNOSIS — C61 Malignant neoplasm of prostate: Secondary | ICD-10-CM

## 2019-02-28 MED ORDER — TECHNETIUM TC 99M MEDRONATE IV KIT
21.5000 | PACK | Freq: Once | INTRAVENOUS | Status: AC | PRN
  Administered 2019-02-28: 12:00:00 21.5 via INTRAVENOUS

## 2019-03-14 ENCOUNTER — Other Ambulatory Visit: Payer: Self-pay

## 2019-03-14 ENCOUNTER — Ambulatory Visit (INDEPENDENT_AMBULATORY_CARE_PROVIDER_SITE_OTHER): Payer: Medicare Other | Admitting: Family Medicine

## 2019-03-14 ENCOUNTER — Encounter: Payer: Self-pay | Admitting: Family Medicine

## 2019-03-14 VITALS — BP 120/72 | HR 63 | Temp 97.8°F | Ht 69.0 in | Wt 185.6 lb

## 2019-03-14 DIAGNOSIS — I10 Essential (primary) hypertension: Secondary | ICD-10-CM

## 2019-03-14 DIAGNOSIS — E785 Hyperlipidemia, unspecified: Secondary | ICD-10-CM

## 2019-03-14 DIAGNOSIS — N184 Chronic kidney disease, stage 4 (severe): Secondary | ICD-10-CM

## 2019-03-14 DIAGNOSIS — E1121 Type 2 diabetes mellitus with diabetic nephropathy: Secondary | ICD-10-CM

## 2019-03-14 LAB — BASIC METABOLIC PANEL
BUN: 25 mg/dL — ABNORMAL HIGH (ref 6–23)
CO2: 27 mEq/L (ref 19–32)
Calcium: 9.7 mg/dL (ref 8.4–10.5)
Chloride: 100 mEq/L (ref 96–112)
Creatinine, Ser: 1.65 mg/dL — ABNORMAL HIGH (ref 0.40–1.50)
GFR: 39.94 mL/min — ABNORMAL LOW (ref >60.00)
Glucose, Bld: 171 mg/dL — ABNORMAL HIGH (ref 70–99)
Potassium: 4.1 mEq/L (ref 3.5–5.1)
Sodium: 137 mEq/L (ref 135–145)

## 2019-03-14 LAB — LIPID PANEL
Cholesterol: 117 mg/dL (ref 0–200)
HDL: 34.6 mg/dL — ABNORMAL LOW (ref >39.00)
LDL Cholesterol: 46 mg/dL (ref 0–99)
NonHDL: 82.65
Total CHOL/HDL Ratio: 3
Triglycerides: 182 mg/dL — ABNORMAL HIGH (ref 0.0–149.0)
VLDL: 36.4 mg/dL (ref 0.0–40.0)

## 2019-03-14 LAB — HEPATIC FUNCTION PANEL
ALT: 16 U/L (ref 0–53)
AST: 11 U/L (ref 0–37)
Albumin: 4.4 g/dL (ref 3.5–5.2)
Alkaline Phosphatase: 79 U/L (ref 39–117)
Bilirubin, Direct: 0.2 mg/dL (ref 0.0–0.3)
Total Bilirubin: 1.3 mg/dL — ABNORMAL HIGH (ref 0.2–1.2)
Total Protein: 7.2 g/dL (ref 6.0–8.3)

## 2019-03-14 LAB — POCT GLYCOSYLATED HEMOGLOBIN (HGB A1C): Hemoglobin A1C: 7.4 % — AB (ref 4.0–5.6)

## 2019-03-14 MED ORDER — CLOPIDOGREL BISULFATE 75 MG PO TABS: 75.0000 mg | ORAL_TABLET | Freq: Every day | ORAL | 3 refills | Status: DC

## 2019-03-14 NOTE — Patient Instructions (Signed)
Your A1C is 7.4%.  Tighten up diet and let's plan on 3 month follow up.

## 2019-03-14 NOTE — Progress Notes (Signed)
Subjective:     Patient ID: [name], male   DOB: 1933/02/14, 85 y.o.   MRN: [id number]  HPI Patient has multiple chronic problems including hypertension, CAD, ischemic cardiomyopathy, peripheral vascular disease, type 2 diabetes, chronic kidney disease, dyslipidemia.  He continues to battle with some dyspnea on exertion.  No chest pain.  He has had fairly extensive cardiac work-up as previously outlined.  He had some reduction in beta-blocker per cardiology back and February but is not made much change in his symptoms.  Type 2 diabetes.  Takes low-dose Januvia no other medications currently.  Poor compliance with diet recently.  No polyuria or polydipsia.  He did have a couple steroid injections in his shoulder through orthopedics at the [facility] hospital within the past few months and that may have had some impact on his A1c.  Appetite and weight stable.  No recent falls.  He had bone scan per urology which showed no evidence for metastatic disease.  He has history of prostate cancer.  Hyperlipidemia treated with Crestor.  Due for follow-up lipids.  Past Medical History:  Diagnosis Date  . Arthritis   . CAD (coronary artery disease)    a. Cath September 2015 LIMA to the LAD patent, SVG to PDA patent, SVG to posterior lateral patent, SVG to OM with a 90% in-stent restenosis at an anastomotic lesion. This was treated with angioplasty. b. cath 04/01/2015 95% ISR in SVG to OM treated with 2.75x24 Synergy DES postdilated to 3.53mm, all other grafts patent  . Cataract    surgery,B/L  . CHF (congestive heart failure) ([place])   . Chronic kidney disease    nephrolithiasis  . Diabetes mellitus    TYPE 2  . GERD (gastroesophageal reflux disease)   . Heart murmur   . Hernia   . Hypercholesterolemia   . Hypertension   . Incontinence    hx  over 1 year,leaks without awareness  . Other and unspecified diseases of appendix   . PAF (paroxysmal atrial fibrillation) ([place])    in the setting of ischemia on  03/31/2015  . Pancreatitis   . Prostate CA ([place]) 03/01/04   prostate bx=Adenocarcinoam,gleason 3+4=7,PSA=6.75  . Shortness of breath dyspnea   . Ulcer    hx gastric   Past Surgical History:  Procedure Laterality Date  . APPENDECTOMY    . BACK SURGERY    . CARDIAC CATHETERIZATION N/A 04/01/2015   Procedure: Left Heart Cath and Cors/Grafts Angiography;  Surgeon: Jettie Booze, MD;  Location: [place] [facility];  Service: Cardiovascular;  Laterality: N/A;  . CARDIAC CATHETERIZATION N/A 04/01/2015   Procedure: Coronary Stent Intervention;  Surgeon: Jettie Booze, MD;  Location: [place] [facility];  Service: Cardiovascular;  Laterality: N/A;  . CARPAL TUNNEL RELEASE  2004   both hands  . CORONARY ARTERY BYPASS GRAFT    . CYSTOSCOPY  08/23/12   incomplete emoptying bladder  . HERNIA REPAIR    . incision and drainage of right chest abscess    . LAPAROSCOPIC CHOLECYSTECTOMY  09/2009  . LEFT HEART CATHETERIZATION WITH CORONARY ANGIOGRAM N/A 07/19/2014   Procedure: LEFT HEART CATHETERIZATION WITH CORONARY ANGIOGRAM;  Surgeon: Leonie Man, MD;  Location[facility] CATH LAB;  Service: Cardiovascular;  Laterality: N/A;  . RECTAL SURGERY    . ROBOT ASSISTED LAPAROSCOPIC RADICAL PROSTATECTOMY  2005    reports that he quit smoking about 46 years ago. His smoking use included cigarettes. He has a 2.50 pack-year smoking history. He has never  used smokeless tobacco. He reports that he does not drink alcohol or use drugs. family history includes Cancer in his mother. Allergies  Allergen Reactions  . Codeine   . Morphine     REACTION: does not like     Review of Systems  Constitutional: Negative for appetite change, fatigue and unexpected weight change.  Eyes: Negative for visual disturbance.  Respiratory: Positive for shortness of breath. Negative for cough and chest tightness.   Cardiovascular: Negative for chest pain, palpitations and leg swelling.  Gastrointestinal: Negative for  abdominal pain.  Neurological: Negative for dizziness, syncope, weakness, light-headedness and headaches.       Objective:   Physical Exam Constitutional:      Appearance: He is well-developed.  HENT:     Right Ear: External ear normal.     Left Ear: External ear normal.  Eyes:     Pupils: Pupils are equal, round, and reactive to light.  Neck:     Musculoskeletal: Neck supple.     Thyroid: No thyromegaly.  Cardiovascular:     Rate and Rhythm: Normal rate and regular rhythm.  Pulmonary:     Effort: Pulmonary effort is normal. No respiratory distress.     Breath sounds: Normal breath sounds. No wheezing or rales.  Musculoskeletal:     Right lower leg: No edema.     Left lower leg: No edema.  Neurological:     Mental Status: He is alert and oriented to person, place, and time.        Assessment:     #1 type 2 diabetes.  Slightly worsened control with A1c 7.4%.  Likely reflecting recent poor compliance with diet and also couple steroid injections per orthopedics  #2 hypertension stable and at goal  #3 history of prostate cancer-recent bone scan as above no acute findings  #4 hyperlipidemia  #5 chronic dyspnea-unchanged  #6 chronic kidney disease    Plan:     -Tighten up diet and will plan repeat A1c in 3 months.  If not improving at that point consider additional medication  -Refill Plavix for 1 year  -Reminder that he has carotid Doppler scheduled in June to assess his bilateral mild to moderate internal carotid stenosis  -Check lipid panel, hepatic panel, basic metabolic panel  Shawn Post MD [facility] at [facility]

## 2019-04-10 ENCOUNTER — Ambulatory Visit (HOSPITAL_COMMUNITY)
Admission: RE | Admit: 2019-04-10 | Discharge: 2019-04-10 | Disposition: A | Discharge status: Home / Self Care | Payer: Medicare Other | Source: Ambulatory Visit | Attending: Cardiology | Admitting: Cardiology

## 2019-04-10 ENCOUNTER — Other Ambulatory Visit: Payer: Self-pay

## 2019-04-10 ENCOUNTER — Other Ambulatory Visit (HOSPITAL_COMMUNITY): Payer: Self-pay | Admitting: Cardiology

## 2019-04-10 DIAGNOSIS — I6523 Occlusion and stenosis of bilateral carotid arteries: Secondary | ICD-10-CM | POA: Diagnosis present

## 2019-04-12 ENCOUNTER — Telehealth: Payer: Self-pay | Admitting: *Deleted

## 2019-04-12 NOTE — Telephone Encounter (Signed)
Left message to call back  

## 2019-04-12 NOTE — Telephone Encounter (Signed)
-----   Message from [name], MD sent at 04/10/2019  8:42 PM EDT ----- Right Carotid: Velocities in the right ICA are consistent with a 1-39% stenosis. Left Carotid: Velocities in the left ICA are consistent with a 60-79% stenosis. Followup in one year.

## 2019-04-13 ENCOUNTER — Encounter: Payer: Self-pay | Admitting: *Deleted

## 2019-04-13 NOTE — Telephone Encounter (Signed)
Advised wife, ok per [facility]

## 2019-04-19 ENCOUNTER — Other Ambulatory Visit: Payer: Self-pay | Admitting: Family Medicine

## 2019-05-08 ENCOUNTER — Other Ambulatory Visit: Payer: Self-pay | Admitting: Family Medicine

## 2019-05-10 ENCOUNTER — Other Ambulatory Visit: Payer: Self-pay | Admitting: Cardiology

## 2019-06-01 ENCOUNTER — Ambulatory Visit: Payer: Medicare Other | Admitting: Cardiology

## 2019-06-03 NOTE — Progress Notes (Signed)
HPI The patient presents for followup of CAD.   He had chest pain and had a cardiac catheterization on 04/01/2015 which showed a 95% in-stent restenosis in SVG to OM, treated with 2.75 x 24 mm Synergy DES postdilated to 3.3 mm. He had patent LIMA to LAD, patent SVG to posterolateral, patent SVG to PDA.    Of note he did have atrial fibrillation with rate control on presentation but he converted spontaneously. He was not started on warfarin because he was taking aspirin and Plavix.  He has had no symptomatic recurrence of this arrhythmia.   He wore an event monitor and there was no evidence of atrial fib.  He was having SOB in Dec 2018.  On [place] he had a fixed inferior defect.  CPX testing suggested a mixed restrictive/obstructive pattern of PFTs.  He was not thought to have a cardiovascular limitation.    He returns for follow up.  He says now that he is not getting short of breath that he wants.  Thinks he has something to do with humidity.  Does not have any bouts of dyspnea.  He does not have PND or orthopnea.  He has no palpitations, presyncope or syncope.  He can remain somewhat active in his yard without limitations.  Allergies  Allergen Reactions  . Codeine   . Morphine     REACTION: does not like    Current Outpatient Medications  Medication Sig Dispense Refill  . amLODipine (NORVASC) 10 MG tablet TAKE 1 TABLET BY MOUTH EVERY DAY 90 tablet 1  . carvedilol (COREG) 6.25 MG tablet Take 1 tablet (6.25 mg total) by mouth 2 (two) times daily with a meal. 180 tablet 3  . clopidogrel (PLAVIX) 75 MG tablet Take 1 tablet (75 mg total) by mouth daily. TAKE 1 TABLET BY MOUTH EVERY DAY 90 tablet 3  . furosemide (LASIX) 40 MG tablet TAKE 1 TABLET BY MOUTH EVERY DAY 90 tablet 2  . glucose blood (FREESTYLE LITE) test strip 1 each by Other route as needed. Use as instructed     . JANUVIA 50 MG tablet TAKE 1 TABLET BY MOUTH EVERY DAY 90 tablet 0  . Lancets (FREESTYLE) lancets 1 each by  Other route as needed. Use as instructed     . nitroGLYCERIN (NITROSTAT) 0.4 MG SL tablet Place 1 tablet (0.4 mg total) under the tongue every 5 (five) minutes x 3 doses as needed for chest pain (if no relief after 3rd dose, proceed to the ED for an evaluation). 75 tablet 1  . pantoprazole (PROTONIX) 40 MG tablet TAKE 1 TABLET (40 MG TOTAL) BY MOUTH DAILY. 90 tablet 1  . rosuvastatin (CRESTOR) 20 MG tablet TAKE 1 TABLET BY MOUTH EVERY DAY 90 tablet 1  . spironolactone (ALDACTONE) 25 MG tablet TAKE 1 TABLET BY MOUTH EVERY DAY 90 tablet 0  . traMADol (ULTRAM) 50 MG tablet Take 1 tablet (50 mg total) by mouth every 6 (six) hours as needed. 1-2 every 6 h prn pain 120 tablet 5   No current facility-administered medications for this visit.     Past Medical History:  Diagnosis Date  . Arthritis   . CAD (coronary artery disease)    a. Cath September 2015 LIMA to the LAD patent, SVG to PDA patent, SVG to posterior lateral patent, SVG to OM with a 90% in-stent restenosis at an anastomotic lesion. This was treated with angioplasty. b. cath 04/01/2015 95% ISR in SVG to OM treated with  2.75x24 Synergy DES postdilated to 3.22mm, all other grafts patent  . Cataract    surgery,B/L  . CHF (congestive heart failure) ([place])   . Chronic kidney disease    nephrolithiasis  . Diabetes mellitus    TYPE 2  . GERD (gastroesophageal reflux disease)   . Heart murmur   . Hernia   . Hypercholesterolemia   . Hypertension   . Incontinence    hx  over 1 year,leaks without awareness  . Other and unspecified diseases of appendix   . PAF (paroxysmal atrial fibrillation) ([place])    in the setting of ischemia on 03/31/2015  . Pancreatitis   . Prostate CA ([place]) 03/01/04   prostate bx=Adenocarcinoam,gleason 3+4=7,PSA=6.75  . Shortness of breath dyspnea   . Ulcer    hx gastric    Past Surgical History:  Procedure Laterality Date  . APPENDECTOMY    . BACK SURGERY    . CARDIAC CATHETERIZATION N/A 04/01/2015   Procedure:  Left Heart Cath and Cors/Grafts Angiography;  Surgeon: Jettie Booze, MD;  Location: [place] [facility];  Service: Cardiovascular;  Laterality: N/A;  . CARDIAC CATHETERIZATION N/A 04/01/2015   Procedure: Coronary Stent Intervention;  Surgeon: Jettie Booze, MD;  Location: [place] [facility];  Service: Cardiovascular;  Laterality: N/A;  . CARPAL TUNNEL RELEASE  2004   both hands  . CORONARY ARTERY BYPASS GRAFT    . CYSTOSCOPY  08/23/12   incomplete emoptying bladder  . HERNIA REPAIR    . incision and drainage of right chest abscess    . LAPAROSCOPIC CHOLECYSTECTOMY  09/2009  . LEFT HEART CATHETERIZATION WITH CORONARY ANGIOGRAM N/A 07/19/2014   Procedure: LEFT HEART CATHETERIZATION WITH CORONARY ANGIOGRAM;  Surgeon: Leonie Man, MD;  Location: [facility] CATH LAB;  Service: Cardiovascular;  Laterality: N/A;  . RECTAL SURGERY    . ROBOT ASSISTED LAPAROSCOPIC RADICAL PROSTATECTOMY  2005    ROS:   As stated in the HPI and negative for all other systems.   PHYSICAL EXAM BP (!) 142/68   Pulse (!) 54   Temp (!) 97.5 F (36.4 C)   Ht 5\' 9"  (1.753 m)   Wt 185 lb 12.8 oz (84.3 kg)   SpO2 92%   BMI 27.44 kg/m   GENERAL:  Well appearing NECK:  No jugular venous distention, waveform within normal limits, carotid upstroke brisk and symmetric, no bruits, no thyromegaly LUNGS:  Clear to auscultation bilaterally CHEST:  Well healed sternotomy scar. HEART:  PMI not displaced or sustained,S1 and S2 within normal limits, no S3, no S4, no clicks, no rubs, no murmurs ABD:  Flat, positive bowel sounds normal in frequency in pitch, no bruits, no rebound, no guarding, no midline pulsatile mass, no hepatomegaly, no splenomegaly EXT:  2 plus pulses throughout, no edema, no cyanosis no clubbing  EKG: NA   Lab Results  Component Value Date   CHOL 117 03/14/2019   TRIG 182.0 (H) 03/14/2019   HDL 34.60 (L) 03/14/2019   LDLCALC 46 03/14/2019   LDLDIRECT 143.3 11/23/2007   Lab Results  Component  Value Date   HGBA1C 7.4 (A) 03/14/2019   Lab Results  Component Value Date   CREATININE 1.65 (H) 03/14/2019   Lab Results  Component Value Date   HGBA1C 7.4 (A) 03/14/2019     ASSESSMENT AND PLAN   CORONARY ARTERY DISEASE  He has a negative ischemia work-up last year.  There was no suggestion of cardiac limitation on the cardiopulmonary stress test which I reviewed  for this visit.  Is not having any active chest pain.  Therefore, no further cardiac testing is indicated.   Atrial fib He had no symptomatic documentation of this since he was in the hospital 3 years ago.  No change in therapy.  Hypertension  The blood pressure is slightly elevated but this is unusual.  Home is in the systolic 800Y.  No change in therapy.   AS:   He had mild AS on echo in June 2019.  No change in therapy.  Carotid stenosis-Bilateral  He has moderate left disease in June.  We reviewed this.  He was 60 to 79% on the left and will follow in 1 year.  We talked about symptoms he should talk about if he had these I will consider further imaging.  CKD: His creatinine was 1.65.  We reviewed the physiology of this and detail and he understands.  DYSPNEA:   This seems to be baseline or better than previous.  If he has continued shortness of breath I would probably send him to see a pulmonologist.  Of note he had documented saturations at 95% walking around the office previously despite being very short of breath.  Some of this was subjective more than objective.

## 2019-06-04 ENCOUNTER — Ambulatory Visit (INDEPENDENT_AMBULATORY_CARE_PROVIDER_SITE_OTHER): Payer: Medicare Other | Admitting: Cardiology

## 2019-06-04 ENCOUNTER — Encounter: Payer: Self-pay | Admitting: Cardiology

## 2019-06-04 ENCOUNTER — Other Ambulatory Visit: Payer: Self-pay

## 2019-06-04 VITALS — BP 142/68 | HR 54 | Temp 97.5°F | Ht 69.0 in | Wt 185.8 lb

## 2019-06-04 DIAGNOSIS — I2510 Atherosclerotic heart disease of native coronary artery without angina pectoris: Secondary | ICD-10-CM

## 2019-06-04 DIAGNOSIS — I10 Essential (primary) hypertension: Secondary | ICD-10-CM

## 2019-06-04 DIAGNOSIS — I350 Nonrheumatic aortic (valve) stenosis: Secondary | ICD-10-CM

## 2019-06-04 DIAGNOSIS — I6523 Occlusion and stenosis of bilateral carotid arteries: Secondary | ICD-10-CM | POA: Diagnosis not present

## 2019-06-04 DIAGNOSIS — N183 Chronic kidney disease, stage 3 unspecified: Secondary | ICD-10-CM

## 2019-06-04 DIAGNOSIS — R0602 Shortness of breath: Secondary | ICD-10-CM

## 2019-06-04 NOTE — Patient Instructions (Signed)
Medication Instructions:  Your physician recommends that you continue on your current medications as directed. Please refer to the Current Medication list given to you today.  If you need a refill on your cardiac medications before your next appointment, please call your pharmacy.   Lab work: NONE  Testing/Procedures: NONE  Follow-Up: At [facility], you and your health needs are our priority.  As part of our continuing mission to provide you with exceptional heart care, we have created designated Provider Care Teams.  These Care Teams include your primary Cardiologist (physician) and Advanced Practice Providers (APPs -  Physician Assistants and Nurse Practitioners) who all work together to provide you with the care you need, when you need it. You will need a follow up appointment in 6 months.  Please call our office 2 months in advance to schedule this appointment.  You WILL see one of the following Advanced Practice Providers on your designated Care Team:   Rosaria Ferries, PA-C . Jory Sims, DNP, ANP

## 2019-06-15 ENCOUNTER — Other Ambulatory Visit: Payer: Self-pay

## 2019-06-15 ENCOUNTER — Encounter: Payer: Self-pay | Admitting: Family Medicine

## 2019-06-15 ENCOUNTER — Ambulatory Visit: Payer: Medicare Other | Admitting: Family Medicine

## 2019-06-15 VITALS — BP 122/64 | HR 61 | Temp 97.7°F | Ht 69.0 in | Wt 186.5 lb

## 2019-06-15 DIAGNOSIS — I10 Essential (primary) hypertension: Secondary | ICD-10-CM

## 2019-06-15 DIAGNOSIS — E1121 Type 2 diabetes mellitus with diabetic nephropathy: Secondary | ICD-10-CM | POA: Diagnosis not present

## 2019-06-15 DIAGNOSIS — R0609 Other forms of dyspnea: Secondary | ICD-10-CM | POA: Diagnosis not present

## 2019-06-15 DIAGNOSIS — N184 Chronic kidney disease, stage 4 (severe): Secondary | ICD-10-CM

## 2019-06-15 LAB — POCT GLYCOSYLATED HEMOGLOBIN (HGB A1C): Hemoglobin A1C: 7 % — AB (ref 4.0–5.6)

## 2019-06-15 NOTE — Progress Notes (Signed)
Subjective:     Patient ID: [name], male   DOB: 19-Oct-1932, 85 y.o.   MRN: [id number]  HPI  Patient seen for medical follow-up.  He does saw cardiology recently.  No change of medications.  He has had some chronic dyspnea on exertion which is unchanged and possibly even slightly better compared to last year.  He had O2 sats of 95% with ambulation in cardiology office.  There have been some discussion regarding pulmonary referral but patient declines at this time.  He continues to stay fairly active in his yard and is outdoors much of the day.  Type 2 diabetes.  Last A1c 7.4%.  He has reduced sweet consumption.  He states he is eating fewer oranges.  A1c is improved today 7.0%.  He takes low-dose Januvia but no other diabetes medications currently.  Blood pressure was slightly up in cardiology but much improved today.  Denies any recent headaches, dizziness, chest pains  He has chronic kidney disease stage III-IV.  Does not take any nonsteroidals.  His kidney function is been very stable over the past few years  Past Medical History:  Diagnosis Date  . Arthritis   . CAD (coronary artery disease)    a. Cath September 2015 LIMA to the LAD patent, SVG to PDA patent, SVG to posterior lateral patent, SVG to OM with a 90% in-stent restenosis at an anastomotic lesion. This was treated with angioplasty. b. cath 04/01/2015 95% ISR in SVG to OM treated with 2.75x24 Synergy DES postdilated to 3.29mm, all other grafts patent  . Cataract    surgery,B/L  . CHF (congestive heart failure) ([place])   . Chronic kidney disease    nephrolithiasis  . Diabetes mellitus    TYPE 2  . GERD (gastroesophageal reflux disease)   . Heart murmur   . Hernia   . Hypercholesterolemia   . Hypertension   . Incontinence    hx  over 1 year,leaks without awareness  . Other and unspecified diseases of appendix   . PAF (paroxysmal atrial fibrillation) ([place])    in the setting of ischemia on 03/31/2015  . Pancreatitis    . Prostate CA ([place]) 03/01/04   prostate bx=Adenocarcinoam,gleason 3+4=7,PSA=6.75  . Shortness of breath dyspnea   . Ulcer    hx gastric   Past Surgical History:  Procedure Laterality Date  . APPENDECTOMY    . BACK SURGERY    . CARDIAC CATHETERIZATION N/A 04/01/2015   Procedure: Left Heart Cath and Cors/Grafts Angiography;  Surgeon: Jettie Booze, MD;  Location: [place] [facility];  Service: Cardiovascular;  Laterality: N/A;  . CARDIAC CATHETERIZATION N/A 04/01/2015   Procedure: Coronary Stent Intervention;  Surgeon: Jettie Booze, MD;  Location: [place] [facility];  Service: Cardiovascular;  Laterality: N/A;  . CARPAL TUNNEL RELEASE  2004   both hands  . CORONARY ARTERY BYPASS GRAFT    . CYSTOSCOPY  08/23/12   incomplete emoptying bladder  . HERNIA REPAIR    . incision and drainage of right chest abscess    . LAPAROSCOPIC CHOLECYSTECTOMY  09/2009  . LEFT HEART CATHETERIZATION WITH CORONARY ANGIOGRAM N/A 07/19/2014   Procedure: LEFT HEART CATHETERIZATION WITH CORONARY ANGIOGRAM;  Surgeon: Leonie Man, MD;  Location: [facility] CATH LAB;  Service: Cardiovascular;  Laterality: N/A;  . RECTAL SURGERY    . ROBOT ASSISTED LAPAROSCOPIC RADICAL PROSTATECTOMY  2005    reports that he quit smoking about 46 years ago. His smoking use included cigarettes. He has a  2.50 pack-year smoking history. He has never used smokeless tobacco. He reports that he does not drink alcohol or use drugs. family history includes Cancer in his mother. Allergies  Allergen Reactions  . Codeine   . Morphine     REACTION: does not like    Review of Systems  Constitutional: Negative for chills, fever and unexpected weight change.  Eyes: Negative for visual disturbance.  Respiratory: Positive for shortness of breath. Negative for cough, chest tightness and wheezing.   Cardiovascular: Negative for chest pain, palpitations and leg swelling.  Gastrointestinal: Negative for abdominal pain.  Endocrine: Negative  for polydipsia and polyuria.  Neurological: Negative for dizziness, syncope, weakness, light-headedness and headaches.       Objective:   Physical Exam Constitutional:      Appearance: Normal appearance.  Cardiovascular:     Rate and Rhythm: Normal rate and regular rhythm.  Pulmonary:     Effort: Pulmonary effort is normal.     Breath sounds: Normal breath sounds. No rales.  Musculoskeletal:     Right lower leg: No edema.     Left lower leg: No edema.  Neurological:     General: No focal deficit present.     Mental Status: He is alert and oriented to person, place, and time.        Assessment:     #1 type 2 diabetes improved with A1c 7.0%  #2 hypertension well controlled by today's reading  #3 chronic dyspnea which is unchanged.  Has had extensive cardiac work-up in the past  #4 chronic kidney disease which has been stable for the past several years    Plan:     -Continue dietary changes which he has made recently -We will plan 28-month follow-up to recheck A1c -We discussed his dyspnea issues in some detail.  Recommend following for now.  He is not interested in pulmonary assessment at this time. -We have cautioned him about the heat and high humidity and taking precautions to avoid heat related illness  [name] MD [facility] at [facility]

## 2019-06-26 ENCOUNTER — Other Ambulatory Visit: Payer: Self-pay | Admitting: Family Medicine

## 2019-07-20 ENCOUNTER — Other Ambulatory Visit: Payer: Self-pay | Admitting: Family Medicine

## 2019-07-26 ENCOUNTER — Emergency Department (HOSPITAL_COMMUNITY): Payer: Medicare Other

## 2019-07-26 ENCOUNTER — Inpatient Hospital Stay (HOSPITAL_COMMUNITY)
Admission: EM | Admit: 2019-07-26 | Discharge: 2019-07-28 | DRG: 291 | Disposition: A | Discharge status: Home / Self Care | Payer: Medicare Other | Attending: Internal Medicine | Admitting: Internal Medicine

## 2019-07-26 ENCOUNTER — Ambulatory Visit: Payer: Self-pay | Admitting: *Deleted

## 2019-07-26 ENCOUNTER — Other Ambulatory Visit: Payer: Self-pay | Admitting: Family Medicine

## 2019-07-26 ENCOUNTER — Other Ambulatory Visit: Payer: Self-pay

## 2019-07-26 ENCOUNTER — Encounter (HOSPITAL_COMMUNITY): Payer: Self-pay | Admitting: Emergency Medicine

## 2019-07-26 DIAGNOSIS — K219 Gastro-esophageal reflux disease without esophagitis: Secondary | ICD-10-CM | POA: Diagnosis present

## 2019-07-26 DIAGNOSIS — Z9079 Acquired absence of other genital organ(s): Secondary | ICD-10-CM

## 2019-07-26 DIAGNOSIS — Z20828 Contact with and (suspected) exposure to other viral communicable diseases: Secondary | ICD-10-CM | POA: Diagnosis present

## 2019-07-26 DIAGNOSIS — I255 Ischemic cardiomyopathy: Secondary | ICD-10-CM | POA: Diagnosis present

## 2019-07-26 DIAGNOSIS — Z87891 Personal history of nicotine dependence: Secondary | ICD-10-CM

## 2019-07-26 DIAGNOSIS — E7800 Pure hypercholesterolemia, unspecified: Secondary | ICD-10-CM | POA: Diagnosis present

## 2019-07-26 DIAGNOSIS — N184 Chronic kidney disease, stage 4 (severe): Secondary | ICD-10-CM | POA: Diagnosis present

## 2019-07-26 DIAGNOSIS — R778 Other specified abnormalities of plasma proteins: Secondary | ICD-10-CM | POA: Diagnosis present

## 2019-07-26 DIAGNOSIS — E1121 Type 2 diabetes mellitus with diabetic nephropathy: Secondary | ICD-10-CM | POA: Diagnosis present

## 2019-07-26 DIAGNOSIS — E1169 Type 2 diabetes mellitus with other specified complication: Secondary | ICD-10-CM | POA: Diagnosis not present

## 2019-07-26 DIAGNOSIS — E785 Hyperlipidemia, unspecified: Secondary | ICD-10-CM | POA: Diagnosis present

## 2019-07-26 DIAGNOSIS — I5043 Acute on chronic combined systolic (congestive) and diastolic (congestive) heart failure: Secondary | ICD-10-CM | POA: Diagnosis present

## 2019-07-26 DIAGNOSIS — Z951 Presence of aortocoronary bypass graft: Secondary | ICD-10-CM

## 2019-07-26 DIAGNOSIS — Z8546 Personal history of malignant neoplasm of prostate: Secondary | ICD-10-CM

## 2019-07-26 DIAGNOSIS — I10 Essential (primary) hypertension: Secondary | ICD-10-CM | POA: Diagnosis not present

## 2019-07-26 DIAGNOSIS — I248 Other forms of acute ischemic heart disease: Secondary | ICD-10-CM | POA: Diagnosis present

## 2019-07-26 DIAGNOSIS — Z7984 Long term (current) use of oral hypoglycemic drugs: Secondary | ICD-10-CM | POA: Diagnosis not present

## 2019-07-26 DIAGNOSIS — I480 Paroxysmal atrial fibrillation: Secondary | ICD-10-CM | POA: Diagnosis present

## 2019-07-26 DIAGNOSIS — I5030 Unspecified diastolic (congestive) heart failure: Secondary | ICD-10-CM

## 2019-07-26 DIAGNOSIS — I2510 Atherosclerotic heart disease of native coronary artery without angina pectoris: Secondary | ICD-10-CM | POA: Diagnosis present

## 2019-07-26 DIAGNOSIS — H9190 Unspecified hearing loss, unspecified ear: Secondary | ICD-10-CM | POA: Diagnosis present

## 2019-07-26 DIAGNOSIS — R0902 Hypoxemia: Secondary | ICD-10-CM | POA: Diagnosis present

## 2019-07-26 DIAGNOSIS — I5033 Acute on chronic diastolic (congestive) heart failure: Secondary | ICD-10-CM | POA: Diagnosis present

## 2019-07-26 DIAGNOSIS — N1831 Chronic kidney disease, stage 3a: Secondary | ICD-10-CM | POA: Diagnosis not present

## 2019-07-26 DIAGNOSIS — E1122 Type 2 diabetes mellitus with diabetic chronic kidney disease: Secondary | ICD-10-CM | POA: Diagnosis present

## 2019-07-26 DIAGNOSIS — I159 Secondary hypertension, unspecified: Secondary | ICD-10-CM | POA: Diagnosis not present

## 2019-07-26 DIAGNOSIS — Z79899 Other long term (current) drug therapy: Secondary | ICD-10-CM | POA: Diagnosis not present

## 2019-07-26 DIAGNOSIS — Z7902 Long term (current) use of antithrombotics/antiplatelets: Secondary | ICD-10-CM

## 2019-07-26 DIAGNOSIS — I4819 Other persistent atrial fibrillation: Secondary | ICD-10-CM | POA: Diagnosis not present

## 2019-07-26 DIAGNOSIS — I130 Hypertensive heart and chronic kidney disease with heart failure and stage 1 through stage 4 chronic kidney disease, or unspecified chronic kidney disease: Principal | ICD-10-CM | POA: Diagnosis present

## 2019-07-26 DIAGNOSIS — I350 Nonrheumatic aortic (valve) stenosis: Secondary | ICD-10-CM | POA: Diagnosis not present

## 2019-07-26 DIAGNOSIS — I509 Heart failure, unspecified: Secondary | ICD-10-CM

## 2019-07-26 DIAGNOSIS — R7989 Other specified abnormal findings of blood chemistry: Secondary | ICD-10-CM | POA: Diagnosis present

## 2019-07-26 LAB — URINALYSIS, ROUTINE W REFLEX MICROSCOPIC
Bacteria, UA: NONE SEEN
Bilirubin Urine: NEGATIVE
Glucose, UA: NEGATIVE mg/dL
Hgb urine dipstick: NEGATIVE
Ketones, ur: NEGATIVE mg/dL
Leukocytes,Ua: NEGATIVE
Nitrite: NEGATIVE
Protein, ur: 30 mg/dL — AB
Specific Gravity, Urine: 1.009 (ref 1.005–1.030)
pH: 6 (ref 5.0–8.0)

## 2019-07-26 LAB — CBC
HCT: 41.3 % (ref 39.0–52.0)
Hemoglobin: 13.7 g/dL (ref 13.0–17.0)
MCH: 31.2 pg (ref 26.0–34.0)
MCHC: 33.2 g/dL (ref 30.0–36.0)
MCV: 94.1 fL (ref 80.0–100.0)
Platelets: 130 10*3/uL — ABNORMAL LOW (ref 150–400)
RBC: 4.39 MIL/uL (ref 4.22–5.81)
RDW: 12.6 % (ref 11.5–15.5)
WBC: 12.7 10*3/uL — ABNORMAL HIGH (ref 4.0–10.5)
nRBC: 0 % (ref 0.0–0.2)

## 2019-07-26 LAB — TROPONIN I (HIGH SENSITIVITY)
Troponin I (High Sensitivity): 84 ng/L — ABNORMAL HIGH (ref <18)
Troponin I (High Sensitivity): 107 ng/L (ref <18)

## 2019-07-26 LAB — BRAIN NATRIURETIC PEPTIDE: B Natriuretic Peptide: 441 pg/mL — ABNORMAL HIGH (ref 0.0–100.0)

## 2019-07-26 LAB — BASIC METABOLIC PANEL
Anion gap: 12 (ref 5–15)
BUN: 26 mg/dL — ABNORMAL HIGH (ref 8–23)
CO2: 19 mmol/L — ABNORMAL LOW (ref 22–32)
Calcium: 9.2 mg/dL (ref 8.9–10.3)
Chloride: 103 mmol/L (ref 98–111)
Creatinine, Ser: 1.95 mg/dL — ABNORMAL HIGH (ref 0.61–1.24)
GFR calc Af Amer: 36 mL/min — ABNORMAL LOW (ref >60)
GFR calc non Af Amer: 31 mL/min — ABNORMAL LOW (ref >60)
Glucose, Bld: 282 mg/dL — ABNORMAL HIGH (ref 70–99)
Potassium: 3.9 mmol/L (ref 3.5–5.1)
Sodium: 134 mmol/L — ABNORMAL LOW (ref 135–145)

## 2019-07-26 LAB — GLUCOSE, CAPILLARY
Glucose-Capillary: 213 mg/dL — ABNORMAL HIGH (ref 70–99)
Glucose-Capillary: 139 mg/dL — ABNORMAL HIGH (ref 70–99)

## 2019-07-26 LAB — INFLUENZA PANEL BY PCR (TYPE A & B)
Influenza A By PCR: NEGATIVE
Influenza B By PCR: NEGATIVE

## 2019-07-26 LAB — SARS CORONAVIRUS 2 BY RT PCR (HOSPITAL ORDER, PERFORMED IN ~~LOC~~ HOSPITAL LAB): SARS Coronavirus 2: NEGATIVE

## 2019-07-26 LAB — STREP PNEUMONIAE URINARY ANTIGEN: Strep Pneumo Urinary Antigen: NEGATIVE

## 2019-07-26 MED ORDER — SODIUM CHLORIDE 0.9% FLUSH
3.0000 mL | Freq: Once | INTRAVENOUS | Status: AC
  Administered 2019-07-26: 3 mL via INTRAVENOUS

## 2019-07-26 MED ORDER — SODIUM CHLORIDE 0.9% FLUSH
3.0000 mL | Freq: Two times a day (BID) | INTRAVENOUS | Status: DC
  Administered 2019-07-26 – 2019-07-28 (×5): 3 mL via INTRAVENOUS

## 2019-07-26 MED ORDER — INSULIN ASPART 100 UNIT/ML ~~LOC~~ SOLN: 0.0000 [IU] | Freq: Every day | SUBCUTANEOUS | Status: DC

## 2019-07-26 MED ORDER — SPIRONOLACTONE 25 MG PO TABS
25.0000 mg | ORAL_TABLET | Freq: Every day | ORAL | Status: DC
  Administered 2019-07-27 – 2019-07-28 (×2): 25 mg via ORAL
  Filled 2019-07-26 (×2): qty 1

## 2019-07-26 MED ORDER — INSULIN ASPART 100 UNIT/ML ~~LOC~~ SOLN
0.0000 [IU] | Freq: Three times a day (TID) | SUBCUTANEOUS | Status: DC
  Administered 2019-07-26: 3 [IU] via SUBCUTANEOUS
  Administered 2019-07-27: 1 [IU] via SUBCUTANEOUS
  Administered 2019-07-27: 2 [IU] via SUBCUTANEOUS
  Administered 2019-07-27: 1 [IU] via SUBCUTANEOUS
  Administered 2019-07-28 (×2): 3 [IU] via SUBCUTANEOUS

## 2019-07-26 MED ORDER — ENOXAPARIN SODIUM 30 MG/0.3ML ~~LOC~~ SOLN
30.0000 mg | SUBCUTANEOUS | Status: DC
  Administered 2019-07-26: 30 mg via SUBCUTANEOUS
  Filled 2019-07-26: qty 0.3

## 2019-07-26 MED ORDER — POTASSIUM CHLORIDE CRYS ER 20 MEQ PO TBCR
40.0000 meq | EXTENDED_RELEASE_TABLET | Freq: Once | ORAL | Status: AC
  Administered 2019-07-26: 40 meq via ORAL
  Filled 2019-07-26: qty 2

## 2019-07-26 MED ORDER — SODIUM CHLORIDE 0.9 % IV SOLN
1.0000 g | INTRAVENOUS | Status: DC
  Administered 2019-07-27: 1 g via INTRAVENOUS
  Filled 2019-07-26: qty 10

## 2019-07-26 MED ORDER — LINAGLIPTIN 5 MG PO TABS
5.0000 mg | ORAL_TABLET | Freq: Every day | ORAL | Status: DC
  Administered 2019-07-27 – 2019-07-28 (×2): 5 mg via ORAL
  Filled 2019-07-26 (×2): qty 1

## 2019-07-26 MED ORDER — FUROSEMIDE 10 MG/ML IJ SOLN
40.0000 mg | Freq: Two times a day (BID) | INTRAMUSCULAR | Status: DC
  Administered 2019-07-26 – 2019-07-27 (×2): 40 mg via INTRAVENOUS
  Filled 2019-07-26 (×2): qty 4

## 2019-07-26 MED ORDER — PANTOPRAZOLE SODIUM 40 MG PO TBEC
40.0000 mg | DELAYED_RELEASE_TABLET | Freq: Every day | ORAL | Status: DC
  Administered 2019-07-26 – 2019-07-28 (×3): 40 mg via ORAL
  Filled 2019-07-26 (×3): qty 1

## 2019-07-26 MED ORDER — SODIUM CHLORIDE 0.9% FLUSH: 3.0000 mL | INTRAVENOUS | Status: DC | PRN

## 2019-07-26 MED ORDER — SODIUM CHLORIDE 0.9 % IV SOLN: 250.0000 mL | INTRAVENOUS | Status: DC | PRN

## 2019-07-26 MED ORDER — CARVEDILOL 6.25 MG PO TABS
6.2500 mg | ORAL_TABLET | Freq: Two times a day (BID) | ORAL | Status: DC
  Administered 2019-07-26 – 2019-07-28 (×4): 6.25 mg via ORAL
  Filled 2019-07-26 (×4): qty 1

## 2019-07-26 MED ORDER — SODIUM CHLORIDE 0.9 % IV SOLN
500.0000 mg | Freq: Once | INTRAVENOUS | Status: AC
  Administered 2019-07-26: 500 mg via INTRAVENOUS
  Filled 2019-07-26: qty 500

## 2019-07-26 MED ORDER — NITROGLYCERIN 0.4 MG SL SUBL: 0.4000 mg | SUBLINGUAL_TABLET | SUBLINGUAL | Status: DC | PRN

## 2019-07-26 MED ORDER — SODIUM CHLORIDE 0.9 % IV SOLN
1.0000 g | Freq: Once | INTRAVENOUS | Status: AC
  Administered 2019-07-26: 12:00:00 1 g via INTRAVENOUS
  Filled 2019-07-26: qty 10

## 2019-07-26 MED ORDER — SODIUM CHLORIDE 0.9 % IV SOLN
500.0000 mg | INTRAVENOUS | Status: DC
  Administered 2019-07-27: 500 mg via INTRAVENOUS
  Filled 2019-07-26: qty 500

## 2019-07-26 MED ORDER — ONDANSETRON HCL 4 MG PO TABS: 4.0000 mg | ORAL_TABLET | Freq: Four times a day (QID) | ORAL | Status: DC | PRN

## 2019-07-26 MED ORDER — ROSUVASTATIN CALCIUM 20 MG PO TABS
20.0000 mg | ORAL_TABLET | Freq: Every day | ORAL | Status: DC
  Administered 2019-07-26 – 2019-07-28 (×3): 20 mg via ORAL
  Filled 2019-07-26 (×3): qty 1

## 2019-07-26 MED ORDER — FUROSEMIDE 10 MG/ML IJ SOLN
80.0000 mg | Freq: Once | INTRAMUSCULAR | Status: AC
  Administered 2019-07-26: 80 mg via INTRAVENOUS
  Filled 2019-07-26: qty 8

## 2019-07-26 MED ORDER — CLOPIDOGREL BISULFATE 75 MG PO TABS
75.0000 mg | ORAL_TABLET | Freq: Every day | ORAL | Status: DC
  Administered 2019-07-27: 75 mg via ORAL
  Filled 2019-07-26 (×2): qty 1

## 2019-07-26 MED ORDER — ONDANSETRON HCL 4 MG/2ML IJ SOLN
4.0000 mg | Freq: Four times a day (QID) | INTRAMUSCULAR | Status: DC | PRN
  Administered 2019-07-28: 4 mg via INTRAVENOUS
  Filled 2019-07-26: qty 2

## 2019-07-26 MED ORDER — ACETAMINOPHEN 325 MG PO TABS
650.0000 mg | ORAL_TABLET | Freq: Four times a day (QID) | ORAL | Status: DC | PRN
  Administered 2019-07-26: 650 mg via ORAL
  Filled 2019-07-26: qty 2

## 2019-07-26 NOTE — Telephone Encounter (Signed)
Pt called stating that he has fever and SOB x 2 days; his temp was 100.1 and 99.0; he gained 6-7 pounds in the past 2 weeks; he has swelling in his legs and feet; his wife says that he cannot walk across the room without getting winded; recommendations made per nurse triage protocol; they verbalized understanding, but would like to be seen in the office; explained that his symptoms merit an immediate, higher level of care; they verbalize understanding, and will go to ED; however, they would still like to be seen in the office; the pt sees Dr Elease Hashimoto, [facility]; will route to office for notiication.  Reason for Disposition . [1] MODERATE difficulty breathing (e.g., speaks in phrases, SOB even at rest, pulse 100-120) AND [2] NEW-onset or WORSE than normal  Answer Assessment - Initial Assessment Questions 1. RESPIRATORY STATUS: "Describe your breathing?" (e.g., wheezing, shortness of breath, unable to speak, severe coughing)      Shortness of breath, wheezing  2. ONSET: "When did this breathing problem begin?"     07/24/2019. 3. PATTERN "Does the difficult breathing come and go, or has it been constant since it started?"      constant 4. SEVERITY: "How bad is your breathing?" (e.g., mild, moderate, severe)    - MILD: No SOB at rest, mild SOB with walking, speaks normally in sentences, can lay down, no retractions, pulse < 100.    - MODERATE: SOB at rest, SOB with minimal exertion and prefers to sit, cannot lie down flat, speaks in phrases, mild retractions, audible wheezing, pulse 100-120.    - SEVERE: Very SOB at rest, speaks in single words, struggling to breathe, sitting hunched forward, retractions, pulse > 120      moderate 5. RECURRENT SYMPTOM: "Have you had difficulty breathing before?" If so, ask: "When was the last time?" and "What happened that time?"      Since having heart problems 6. CARDIAC HISTORY: "Do you have any history of heart disease?" (e.g., heart attack, angina, bypass  surgery, angioplasty)     Htn, heart surgeries 7. LUNG HISTORY: "Do you have any history of lung disease?"  (e.g., pulmonary embolus, asthma, emphysema)    Fluid take off lungs 8. CAUSE: "What do you think is causing the breathing problem?"      Not sure 9. OTHER SYMPTOMS: "Do you have any other symptoms? (e.g., dizziness, runny nose, cough, chest pain, fever)    Fever, cough, runny nose 10. PREGNANCY: "Is there any chance you are pregnant?" "When was your last menstrual period?"      n/a 11. TRAVEL: "Have you traveled out of the country in the last month?" (e.g., travel history, exposures)  Protocols used: BREATHING DIFFICULTY-A-AH

## 2019-07-26 NOTE — Consult Note (Signed)
Cardiology Consultation:   Patient ID: [name] MRN: [id number]; DOB: 1933-01-21  Admit date: 07/26/2019 Date of Consult: 07/26/2019  Primary Care Provider: [name], MD Primary Cardiologist: [name], MD  Primary Electrophysiologist:  None    Patient Profile:   [name] is a 85 y.o. male with a hx of CAD status post CABG ('09) with subsequent PCI, diabetes, hypertension, hyperlipidemia, prostate CA, PAF and systolic heart failure who is being seen today for the evaluation of chest pain/elevated troponin at the request of Dr. Laren Everts.  History of Present Illness:   [name] is an 85 year old male with past medical history noted above.  He is followed by Dr. Percival Spanish as an outpatient. S/p CABG 2009.  He underwent cardiac catheterization after stenting with chest pain in 03/2015 which showed 95% in-stent restenosis and a vein graft to OM, treated with PCI/DES x1.  Noted patent LIMA to the LAD, SVG to posterior lateral branch, and SVG to PDA.  Did have brief atrial fibrillation as well but converted spontaneously and was not started on anticoagulation secondary to the need for aspirin and Plavix.  He wore an event monitor as an outpatient with no evidence of atrial fibrillation.  He complained of shortness of breath in December 2018 and had a [employer] noting a fixed inferior defect.  Recent CPX testing suggested mixed restrictive/obstructive pattern of PFTs and he was felt to not have cardiovascular limitation.  Last echocardiogram 03/2018 showed EF of 55 to 60% with moderate LVH, no regional wall motion abnormality and grade 1 diastolic dysfunction.  There was mild aortic stenosis.  He was last seen in the office on 06/04/2019 for follow-up.  Overall reporting doing well at this visit, and actually reported his dyspnea had improved.  He was continued on his home medications without any changes.  He presented to the ED with several days worth of worsening shortness of  breath.  Also developed fever highest at 101.  States he has chronically been short of breath but symptoms worsened over the past 2 to 3 days and he was concerned he may have been exposed to COVID-19.  States he had difficulty walking more than a couple of feet without stopping to catch his breath.  He does not normally wear home O2.  Did report some brief episodes of sharp shooting pain in the upper abdomen across the chest, but not associated with exertion or rest.  Does report about a 7 pound weight gain over the past week with abdominal fullness.  Labs in the ED showed sodium 134, creatinine 1.9, BNP 441, high-sensitivity troponin 84>> 107, WBC 12.7, hemoglobin 13.7.  COVID negative.  Chest x-ray showed cardiomegaly with interstitial edema.  EKG showed sinus rhythm with left bundle branch block with nonspecific ST/T wave abnormalities.  He was admitted to internal medicine for further work-up with presumed pneumonia.  Started on Rocephin and Zithromax.  Also given IV Lasix.  Did note significant improvement in his respiratory status though recently got up to use the urinal and was quite short of breath at the time of interview.  Heart Pathway Score:     Past Medical History:  Diagnosis Date  . Arthritis   . CAD (coronary artery disease)    a. Cath September 2015 LIMA to the LAD patent, SVG to PDA patent, SVG to posterior lateral patent, SVG to OM with a 90% in-stent restenosis at an anastomotic lesion. This was treated with angioplasty. b. cath 04/01/2015 95% ISR in  SVG to OM treated with 2.75x24 Synergy DES postdilated to 3.46mm, all other grafts patent  . Cataract    surgery,B/L  . CHF (congestive heart failure) ([place])   . Chronic kidney disease    nephrolithiasis  . Diabetes mellitus    TYPE 2  . GERD (gastroesophageal reflux disease)   . Heart murmur   . Hernia   . Hypercholesterolemia   . Hypertension   . Incontinence    hx  over 1 year,leaks without awareness  . Other and unspecified  diseases of appendix   . PAF (paroxysmal atrial fibrillation) ([place])    in the setting of ischemia on 03/31/2015  . Pancreatitis   . Prostate CA ([place]) 03/01/04   prostate bx=Adenocarcinoam,gleason 3+4=7,PSA=6.75  . Shortness of breath dyspnea   . Ulcer    hx gastric    Past Surgical History:  Procedure Laterality Date  . APPENDECTOMY    . BACK SURGERY    . CARDIAC CATHETERIZATION N/A 04/01/2015   Procedure: Left Heart Cath and Cors/Grafts Angiography;  Surgeon: Jettie Booze, MD;  Location: [place] [facility];  Service: Cardiovascular;  Laterality: N/A;  . CARDIAC CATHETERIZATION N/A 04/01/2015   Procedure: Coronary Stent Intervention;  Surgeon: Jettie Booze, MD;  Location: [place] [facility];  Service: Cardiovascular;  Laterality: N/A;  . CARPAL TUNNEL RELEASE  2004   both hands  . CORONARY ARTERY BYPASS GRAFT    . CYSTOSCOPY  08/23/12   incomplete emoptying bladder  . HERNIA REPAIR    . incision and drainage of right chest abscess    . LAPAROSCOPIC CHOLECYSTECTOMY  09/2009  . LEFT HEART CATHETERIZATION WITH CORONARY ANGIOGRAM N/A 07/19/2014   Procedure: LEFT HEART CATHETERIZATION WITH CORONARY ANGIOGRAM;  Surgeon: Leonie Man, MD;  Location: [facility] CATH LAB;  Service: Cardiovascular;  Laterality: N/A;  . RECTAL SURGERY    . ROBOT ASSISTED LAPAROSCOPIC RADICAL PROSTATECTOMY  2005     Home Medications:  Prior to Admission medications   Medication Sig Start Date End Date Taking? Authorizing Provider  amLODipine (NORVASC) 10 MG tablet TAKE 1 TABLET BY MOUTH EVERY DAY Patient taking differently: Take 10 mg by mouth daily.  06/26/19  Yes Burchette, Alinda Sierras, MD  carvedilol (COREG) 6.25 MG tablet Take 1 tablet (6.25 mg total) by mouth 2 (two) times daily with a meal. 12/05/18  Yes [name], MD  clopidogrel (PLAVIX) 75 MG tablet Take 1 tablet (75 mg total) by mouth daily. TAKE 1 TABLET BY MOUTH EVERY DAY Patient taking differently: Take 75 mg by mouth daily.  03/14/19  Yes  Burchette, Alinda Sierras, MD  docusate sodium (STOOL SOFTENER) 100 MG capsule Take 100 mg by mouth every evening.   Yes [provider]  furosemide (LASIX) 40 MG tablet TAKE 1 TABLET BY MOUTH EVERY DAY Patient taking differently: Take 40 mg by mouth daily.  01/26/19  Yes Hochrein, Jeneen Rinks, MD  JANUVIA 50 MG tablet TAKE 1 TABLET BY MOUTH EVERY DAY Patient taking differently: Take 50 mg by mouth daily.  07/21/19  Yes Burchette, Alinda Sierras, MD  Multiple Vitamins-Minerals (ICAPS AREDS 2 PO) Take 1 capsule by mouth daily.   Yes [provider]  nitroGLYCERIN (NITROSTAT) 0.4 MG SL tablet Place 1 tablet (0.4 mg total) under the tongue every 5 (five) minutes x 3 doses as needed for chest pain (if no relief after 3rd dose, proceed to the ED for an evaluation). 04/17/18  Yes [name], MD  pantoprazole (PROTONIX) 40 MG tablet TAKE  1 TABLET BY MOUTH EVERY DAY Patient taking differently: Take 40 mg by mouth every evening.  07/26/19  Yes Burchette, Alinda Sierras, MD  rosuvastatin (CRESTOR) 20 MG tablet TAKE 1 TABLET BY MOUTH EVERY DAY Patient taking differently: Take 20 mg by mouth every evening.  05/08/19  Yes Burchette, Alinda Sierras, MD  spironolactone (ALDACTONE) 25 MG tablet TAKE 1 TABLET BY MOUTH EVERY DAY Patient taking differently: Take 25 mg by mouth daily.  05/11/19  Yes [name], MD  traMADol (ULTRAM) 50 MG tablet Take 1 tablet (50 mg total) by mouth every 6 (six) hours as needed. 1-2 every 6 h prn pain Patient taking differently: Take 50-100 mg by mouth every 6 (six) hours as needed for moderate pain.  11/04/11  Yes Burchette, Alinda Sierras, MD  glucose blood (FREESTYLE LITE) test strip 1 each by Other route as needed. Use as instructed     [provider]  Lancets (FREESTYLE) lancets 1 each by Other route as needed. Use as instructed     [provider]    Inpatient Medications: Scheduled Meds:  Continuous Infusions:  PRN Meds:   Allergies:    Allergies  Allergen Reactions   . Codeine   . Morphine     REACTION: does not like    Social History:   Social History   Socioeconomic History  . Marital status: Married    Spouse name: Not on file  . Number of children: 2  . Years of education: Not on file  . Highest education level: Not on file  Occupational History    Employer: RETIRED    Comment: Retired  Scientific laboratory technician  . Financial resource strain: Not on file  . Food insecurity    Worry: Not on file    Inability: Not on file  . Transportation needs    Medical: Not on file    Non-medical: Not on file  Tobacco Use  . Smoking status: Former Smoker    Packs/day: 0.50    Years: 5.00    Pack years: 2.50    Types: Cigarettes    Quit date: 07/20/1972    Years since quitting: 47.0  . Smokeless tobacco: Never Used  . Tobacco comment: quit s  Substance and Sexual Activity  . Alcohol use: No  . Drug use: No    Comment: quit smoking 40 years ago  . Sexual activity: Never  Lifestyle  . Physical activity    Days per week: Not on file    Minutes per session: Not on file  . Stress: Not on file  Relationships  . Social Herbalist on phone: Not on file    Gets together: Not on file    Attends religious service: Not on file    Active member of club or organization: Not on file    Attends meetings of clubs or organizations: Not on file    Relationship status: Not on file  . Intimate partner violence    Fear of current or ex partner: Not on file    Emotionally abused: Not on file    Physically abused: Not on file    Forced sexual activity: Not on file  Other Topics Concern  . Not on file  Social History Narrative   Retired   Married       Family History:    Family History  Problem Relation Age of Onset  . Cancer Mother        stomach  ROS:  Please see the history of present illness.   All other ROS reviewed and negative.     Physical Exam/Data:   Vitals:   07/26/19 1315 07/26/19 1330 07/26/19 1339 07/26/19 1345  BP:  127/80 (!) 147/119 137/78 136/73  Pulse: 78 74 61 69  Resp: (!) 22 (!) 26 (!) 21 (!) 23  Temp:      TempSrc:      SpO2: 98% 96% 99% 98%  Weight:      Height:        Intake/Output Summary (Last 24 hours) at 07/26/2019 1356 Last data filed at 07/26/2019 1310 Gross per 24 hour  Intake 103 ml  Output 1020 ml  Net -917 ml   Last 3 Weights 07/26/2019 06/15/2019 06/04/2019  Weight (lbs) 185 lb 186 lb 8 oz 185 lb 12.8 oz  Weight (kg) 83.915 kg 84.596 kg 84.278 kg     Body mass index is 27.32 kg/m.  General:  Well nourished, well developed, dyspneic, wearing nasal cannula HEENT: normal Neck: + JVD Endocrine:  No thryomegaly Vascular: No carotid bruits Cardiac:  normal S1, S2; RRR; soft systolic murmur  Lungs: Diminished in bases bilaterally Abd: soft, slightly distended ,nontender, no hepatomegaly  Ext: no edema Musculoskeletal:  No deformities, BUE and BLE strength normal and equal Skin: warm and dry  Neuro:  CNs 2-12 intact, no focal abnormalities noted Psych:  Normal affect   EKG:  The EKG was personally reviewed and demonstrates: Sinus rhythm, left bundle branch block, nonspecific ST/T wave abnormality  Relevant CV Studies:  Cath: 03/2015   Dist Graft to Insertion into the native obtuse marginal had a severe lesion, in-stent restenosis, 95% stenosed. This area was treated with a 2.75 x 24 Synergy drug-eluting stent, postdilated to 3.3 mm in diameter. There is a 0% residual stenosis post intervention. The lesion was previously treated with a stent (unknown type) greater than two years ago.  Severe native three-vessel coronary artery disease. RCA was documented occluded by prior cath. This was not injected to save contrast. Patent LIMA to LAD. Patent SVG to posterior lateral artery. Patent SVG to PDA.  A drug-eluting stent was placed.   Continue clopidogrel for antiplatelets therapy. Will restart aspirin for now. He will likely be started on Coumadin for atrial fibrillation. Once  his Coumadin is therapeutic, will likely stop aspirin. Long-term, he should be on a combination of Coumadin and clopidogrel, along with aggressive secondary prevention.  Only 60 mL of dye was used for the entire procedure. The patient will be aggressively hydrated postprocedure due to his renal insufficiency.  Diagnostic Dominance: Co-dominant  Intervention     TTE: 03/2018  Study Conclusions  - Left ventricle: The cavity size was normal. Wall thickness was   increased in a pattern of moderate LVH. Systolic function was   normal. The estimated ejection fraction was in the range of 55%   to 60%. Wall motion was normal; there were no regional wall   motion abnormalities. Doppler parameters are consistent with   abnormal left ventricular relaxation (grade 1 diastolic   dysfunction). - Aortic valve: Valve mobility was restricted. There was mild   stenosis. Peak velocity (S): 214 cm/s. Mean gradient (S): 10 mm   Hg. - Left atrium: The atrium was mildly dilated.  Laboratory Data:  High Sensitivity Troponin:   Recent Labs  Lab 07/26/19 0849 07/26/19 1051  TROPONINIHS 84* 107*     Chemistry Recent Labs  Lab 07/26/19 0849  NA 134*  K  3.9  CL 103  CO2 19*  GLUCOSE 282*  BUN 26*  CREATININE 1.95*  CALCIUM 9.2  GFRNONAA 31*  GFRAA 36*  ANIONGAP 12    No results for input(s): PROT, ALBUMIN, AST, ALT, ALKPHOS, BILITOT in the last 168 hours. Hematology Recent Labs  Lab 07/26/19 0849  WBC 12.7*  RBC 4.39  HGB 13.7  HCT 41.3  MCV 94.1  MCH 31.2  MCHC 33.2  RDW 12.6  PLT 130*   BNP Recent Labs  Lab 07/26/19 0849  BNP 441.0*    DDimer No results for input(s): DDIMER in the last 168 hours.   Radiology/Studies:  Dg Chest 2 View  Result Date: 07/26/2019 CLINICAL DATA:  Shortness of breath for 1-2 years. The patient reports recent fevers. EXAM: CHEST - 2 VIEW COMPARISON:  PA and lateral chest 03/09/2016 and 11/01/2018. FINDINGS: There is cardiomegaly and mild  interstitial edema. The patient is status post CABG. No consolidative process, pneumothorax or effusion. No acute or focal bony abnormality. IMPRESSION: Cardiomegaly and mild interstitial edema. Atherosclerosis. Electronically Signed   By: Inge Rise M.D.   On: 07/26/2019 09:28    Assessment and Plan:   [name] is a 85 y.o. male with a hx of CAD status post CABG ('09) with subsequent PCI, diabetes, hypertension, hyperlipidemia, prostate CA, PAF and systolic heart failure who is being seen today for the evaluation of chest pain/elevated troponin at the request of Dr. Laren Everts.  1. Elevated Troponin: High-sensitivity troponin noted at 84>>107.  He does report very brief episodes of sharp upper abdominal pain into his chest prior to admission.  Very atypical in nature.  EKG with sinus rhythm, known left bundle branch block.  Suspect demand ischemia in the setting of acute illness and CHF.  He has been pain-free throughout his stay in the ED.   2.  Dyspnea/possible PNA?: This is a chronic issue for him, but worsened over the past couple of days.  Suspect combination of heart failure and infectious process.  Reported weight up 7 pounds in a week. BNP elevated at 441 chest x-ray with edema.  Has been given a total of 80 mg of IV Lasix with some urine output and symptom relief thus far.  --Continue with diuresis --I&O's and daily weights -- monitor renal function  3.  Chronic diastolic HF: As above BNP elevated and chest x-ray with edema.  Reports being compliant with his Lasix 40 mg daily at home.  Some volume noted on exam.  --Check echo  4.  CAD s/p CABG with subsequent stenting: Cath 03/2015 patent LIMA to LAD, SVG to posterior lateral branch, SVG to PDA.  DES placed to in-stent restenosis of SVG to OM.  On Plavix  5. HTN: on coreg and spiro. Hypertensive in the ED. May need further titration. \  6. HL: on statin  For questions or updates, please contact [place] Please consult  [email or web address] for contact info under     Signed, Reino Bellis, NP  07/26/2019 1:56 PM

## 2019-07-26 NOTE — Telephone Encounter (Signed)
Pt has arrived [facility] ED for evaluation.

## 2019-07-26 NOTE — ED Notes (Signed)
ED TO INPATIENT HANDOFF REPORT  ED Nurse Name and Phone #:   S Name/Age/Gender [name] 85 y.o. male Room/Bed: 028C/028C  Code Status   Code Status: Full Code  Home/SNF/Other Home Patient oriented to: self, place, time and situation Is this baseline? Yes   Triage Complete: Triage complete  Chief Complaint diff breathing and fever  Triage Note Pt reports SOB for a year or two and recent fevers. Pt reports the SOB is getting worse and he can no longer walk or exert himself like he was once able. Pt reports fevers over the last 2 days with the highest being 101.0. Pt presents a-febrile today.     Allergies Allergies  Allergen Reactions  . Codeine   . Morphine     REACTION: does not like    Level of Care/Admitting Diagnosis ED Disposition    ED Disposition Condition [facility]: [place] [[id number]]  Level of Care: Telemetry Cardiac [103]  Covid Evaluation: Confirmed COVID Negative  Diagnosis: Congestive heart failure (CHF) [facility]) [[id number]]  Admitting Physician: Merton Border Marshal.Browner  Attending Physician: Laren Everts, ALI Marshal.Browner  Estimated length of stay: past midnight tomorrow  Certification:: I certify this patient will need inpatient services for at least 2 midnights  PT Class (Do Not Modify): Inpatient [101]  PT Acc Code (Do Not Modify): Private [1]       B Medical/Surgery History Past Medical History:  Diagnosis Date  . Arthritis   . CAD (coronary artery disease)    a. Cath September 2015 LIMA to the LAD patent, SVG to PDA patent, SVG to posterior lateral patent, SVG to OM with a 90% in-stent restenosis at an anastomotic lesion. This was treated with angioplasty. b. cath 04/01/2015 95% ISR in SVG to OM treated with 2.75x24 Synergy DES postdilated to 3.1mm, all other grafts patent  . Cataract    surgery,B/L  . CHF (congestive heart failure) ([place])   . Chronic kidney disease    nephrolithiasis  . Diabetes mellitus    TYPE 2   . GERD (gastroesophageal reflux disease)   . Heart murmur   . Hernia   . Hypercholesterolemia   . Hypertension   . Incontinence    hx  over 1 year,leaks without awareness  . Other and unspecified diseases of appendix   . PAF (paroxysmal atrial fibrillation) ([place])    in the setting of ischemia on 03/31/2015  . Pancreatitis   . Prostate CA ([place]) 03/01/04   prostate bx=Adenocarcinoam,gleason 3+4=7,PSA=6.75  . Shortness of breath dyspnea   . Ulcer    hx gastric   Past Surgical History:  Procedure Laterality Date  . APPENDECTOMY    . BACK SURGERY    . CARDIAC CATHETERIZATION N/A 04/01/2015   Procedure: Left Heart Cath and Cors/Grafts Angiography;  Surgeon: Jettie Booze, MD;  Location: [facility];  Service: Cardiovascular;  Laterality: N/A;  . CARDIAC CATHETERIZATION N/A 04/01/2015   Procedure: Coronary Stent Intervention;  Surgeon: Jettie Booze, MD;  Location: [facility];  Service: Cardiovascular;  Laterality: N/A;  . CARPAL TUNNEL RELEASE  2004   both hands  . CORONARY ARTERY BYPASS GRAFT    . CYSTOSCOPY  08/23/12   incomplete emoptying bladder  . HERNIA REPAIR    . incision and drainage of right chest abscess    . LAPAROSCOPIC CHOLECYSTECTOMY  09/2009  . LEFT HEART CATHETERIZATION WITH CORONARY ANGIOGRAM N/A 07/19/2014   Procedure: LEFT HEART CATHETERIZATION WITH  CORONARY ANGIOGRAM;  Surgeon: Leonie Man, MD;  Location[facility] CATH LAB;  Service: Cardiovascular;  Laterality: N/A;  . RECTAL SURGERY    . ROBOT ASSISTED LAPAROSCOPIC RADICAL PROSTATECTOMY  2005     A IV Location/Drains/Wounds Patient Lines/Drains/Airways Status   Active Line/Drains/Airways    Name:   Placement date:   Placement time:   Site:   Days:   Peripheral IV 07/26/19 Upper Forearm   07/26/19    1215    Forearm   less than 1          Intake/Output Last 24 hours  Intake/Output Summary (Last 24 hours) at 07/26/2019 1613 Last data filed at 07/26/2019 1310 Gross per 24 hour   Intake 103 ml  Output 1020 ml  Net -917 ml    Labs/Imaging Results for orders placed or performed during the hospital encounter of 07/26/19 (from the past 48 hour(s))  Basic metabolic panel     Status: Abnormal   Collection Time: 07/26/19  8:49 AM  Result Value Ref Range   Sodium 134 (L) 135 - 145 mmol/L   Potassium 3.9 3.5 - 5.1 mmol/L   Chloride 103 98 - 111 mmol/L   CO2 19 (L) 22 - 32 mmol/L   Glucose, Bld 282 (H) 70 - 99 mg/dL   BUN 26 (H) 8 - 23 mg/dL   Creatinine, Ser 1.95 (H) 0.61 - 1.24 mg/dL   Calcium 9.2 8.9 - 10.3 mg/dL   GFR calc non Af Amer 31 (L) >60 mL/min   GFR calc Af Amer 36 (L) >60 mL/min   Anion gap 12 5 - 15    Comment: Performed at [facility], [address] [place], [place] [address]  CBC     Status: Abnormal   Collection Time: 07/26/19  8:49 AM  Result Value Ref Range   WBC 12.7 (H) 4.0 - 10.5 K/uL   RBC 4.39 4.22 - 5.81 MIL/uL   Hemoglobin 13.7 13.0 - 17.0 g/dL   HCT 41.3 39.0 - 52.0 %   MCV 94.1 80.0 - 100.0 fL   MCH 31.2 26.0 - 34.0 pg   MCHC 33.2 30.0 - 36.0 g/dL   RDW 12.6 11.5 - 15.5 %   Platelets 130 (L) 150 - 400 K/uL   nRBC 0.0 0.0 - 0.2 %    Comment: Performed at [facility], [place] [address], [place], [place] [address]  Troponin I (High Sensitivity)     Status: Abnormal   Collection Time: 07/26/19  8:49 AM  Result Value Ref Range   Troponin I (High Sensitivity) 84 (H) <18 ng/L    Comment: (NOTE) Elevated high sensitivity troponin I (hsTnI) values and significant  changes across serial measurements may suggest ACS but many other  chronic and acute conditions are known to elevate hsTnI results.  Refer to the "Links" section for chest pain algorithms and additional  guidance. Performed at [facility], [place] [address], [place], [place] [address]   Brain natriuretic peptide     Status: Abnormal   Collection Time: 07/26/19  8:49 AM  Result Value Ref Range   B Natriuretic Peptide 441.0 (H) 0.0 - 100.0 pg/mL     Comment: Performed at [facility] [address], [place], [place] [address]  SARS Coronavirus 2 [facility] order, Performed in [facility] hospital lab) Nasopharyngeal Nasopharyngeal Swab     Status: None   Collection Time: 07/26/19 10:47 AM   Specimen: Nasopharyngeal Swab  Result  Value Ref Range   SARS Coronavirus 2 NEGATIVE NEGATIVE    Comment: (NOTE) If result is NEGATIVE SARS-CoV-2 target nucleic acids are NOT DETECTED. The SARS-CoV-2 RNA is generally detectable in upper and lower  respiratory specimens during the acute phase of infection. The lowest  concentration of SARS-CoV-2 viral copies this assay can detect is 250  copies / mL. A negative result does not preclude SARS-CoV-2 infection  and should not be used as the sole basis for treatment or other  patient management decisions.  A negative result may occur with  improper specimen collection / handling, submission of specimen other  than nasopharyngeal swab, presence of viral mutation(s) within the  areas targeted by this assay, and inadequate number of viral copies  (<250 copies / mL). A negative result must be combined with clinical  observations, patient history, and epidemiological information. If result is POSITIVE SARS-CoV-2 target nucleic acids are DETECTED. The SARS-CoV-2 RNA is generally detectable in upper and lower  respiratory specimens dur ing the acute phase of infection.  Positive  results are indicative of active infection with SARS-CoV-2.  Clinical  correlation with patient history and other diagnostic information is  necessary to determine patient infection status.  Positive results do  not rule out bacterial infection or co-infection with other viruses. If result is PRESUMPTIVE POSTIVE SARS-CoV-2 nucleic acids MAY BE PRESENT.   A presumptive positive result was obtained on the submitted specimen  and confirmed on repeat testing.  While 2019 novel coronavirus  (SARS-CoV-2) nucleic acids may be  present in the submitted sample  additional confirmatory testing may be necessary for epidemiological  and / or clinical management purposes  to differentiate between  SARS-CoV-2 and other Sarbecovirus currently known to infect humans.  If clinically indicated additional testing with an alternate test  methodology [phone]) is advised. The SARS-CoV-2 RNA is generally  detectable in upper and lower respiratory sp ecimens during the acute  phase of infection. The expected result is Negative. Fact Sheet for Patients:  StrictlyIdeas.no Fact Sheet for Healthcare Providers: BankingDealers.co.za This test is not yet approved or cleared by the [place] FDA and has been authorized for detection and/or diagnosis of SARS-CoV-2 by FDA under an Emergency Use Authorization (EUA).  This EUA will remain in effect (meaning this test can be used) for the duration of the COVID-19 declaration under Section 564(b)(1) of the Act, 21 U.S.C. section 360bbb-3(b)(1), unless the authorization is terminated or revoked sooner. Performed at [facility], [place] [address], [place], [place] [address]   Troponin I (High Sensitivity)     Status: Abnormal   Collection Time: 07/26/19 10:51 AM  Result Value Ref Range   Troponin I (High Sensitivity) 107 (HH) <18 ng/L    Comment: CRITICAL RESULT CALLED TO, READ BACK BY AND VERIFIED WITH: Amaziah Raisanen,H RN @ 0623 07/26/19 LEONARD,A (NOTE) Elevated high sensitivity troponin I (hsTnI) values and significant  changes across serial measurements may suggest ACS but many other  chronic and acute conditions are known to elevate hsTnI results.  Refer to the Links section for chest pain algorithms and additional  guidance. Performed at [facility], [place] [address], [place], [place] [address]   Urinalysis, Routine w reflex microscopic     Status: Abnormal   Collection Time: 07/26/19 11:11 AM  Result Value Ref Range    Color, Urine YELLOW YELLOW   APPearance CLEAR CLEAR   Specific Gravity, Urine 1.009 1.005 - 1.030   pH 6.0 5.0 - 8.0  Glucose, UA NEGATIVE NEGATIVE mg/dL   Hgb urine dipstick NEGATIVE NEGATIVE   Bilirubin Urine NEGATIVE NEGATIVE   Ketones, ur NEGATIVE NEGATIVE mg/dL   Protein, ur 30 (A) NEGATIVE mg/dL   Nitrite NEGATIVE NEGATIVE   Leukocytes,Ua NEGATIVE NEGATIVE   WBC, UA 0-5 0 - 5 WBC/hpf   Bacteria, UA NONE SEEN NONE SEEN   Mucus PRESENT    Hyaline Casts, UA PRESENT     Comment: Performed at [facility] [address] [place], [place] [address]   Dg Chest 2 View  Result Date: 07/26/2019 CLINICAL DATA:  Shortness of breath for 1-2 years. The patient reports recent fevers. EXAM: CHEST - 2 VIEW COMPARISON:  PA and lateral chest 03/09/2016 and 11/01/2018. FINDINGS: There is cardiomegaly and mild interstitial edema. The patient is status post CABG. No consolidative process, pneumothorax or effusion. No acute or focal bony abnormality. IMPRESSION: Cardiomegaly and mild interstitial edema. Atherosclerosis. Electronically Signed   By: Inge Rise M.D.   On: 07/26/2019 09:28    Pending Labs Unresulted Labs (From admission, onward)    Start     Ordered   07/27/19 0350  Basic metabolic panel  Tomorrow morning,   R     07/26/19 1348   07/26/19 1349  Influenza panel by PCR (type A & B)  (Influenza PCR Panel)  Once,   STAT     07/26/19 1348   07/26/19 1349  Legionella Pneumophila Serogp 1 Ur Ag  Once,   STAT     07/26/19 1348   07/26/19 1349  Strep pneumoniae urinary antigen  Once,   STAT     07/26/19 1348   07/26/19 1007  Urine culture  ONCE - STAT,   STAT     07/26/19 1006          Vitals/Pain Today's Vitals   07/26/19 1515 07/26/19 1530 07/26/19 1545 07/26/19 1600  BP: (!) 145/77 (!) 150/81 (!) 147/80 136/72  Pulse:   77 66  Resp: (!) 28 20 (!) 27 (!) 23  Temp:      TempSrc:      SpO2: 97% 98% 98% 98%  Weight:      Height:      PainSc:        Isolation  Precautions Droplet precaution  Medications Medications  nitroGLYCERIN (NITROSTAT) SL tablet 0.4 mg (has no administration in time range)  carvedilol (COREG) tablet 6.25 mg (has no administration in time range)  clopidogrel (PLAVIX) tablet 75 mg (has no administration in time range)  rosuvastatin (CRESTOR) tablet 20 mg (has no administration in time range)  spironolactone (ALDACTONE) tablet 25 mg (has no administration in time range)  linagliptin (TRADJENTA) tablet 5 mg (has no administration in time range)  pantoprazole (PROTONIX) EC tablet 40 mg (has no administration in time range)  enoxaparin (LOVENOX) injection 40 mg (has no administration in time range)  sodium chloride flush (NS) 0.9 % injection 3 mL (has no administration in time range)  sodium chloride flush (NS) 0.9 % injection 3 mL (has no administration in time range)  0.9 %  sodium chloride infusion (has no administration in time range)  ondansetron (ZOFRAN) tablet 4 mg (has no administration in time range)    Or  ondansetron (ZOFRAN) injection 4 mg (has no administration in time range)  insulin aspart (novoLOG) injection 0-9 Units (has no administration in time range)  insulin aspart (novoLOG) injection 0-5 Units (has no administration in time range)  furosemide (LASIX) injection 40 mg (has  no administration in time range)  sodium chloride flush (NS) 0.9 % injection 3 mL (3 mLs Intravenous Given 07/26/19 1100)  furosemide (LASIX) injection 80 mg (80 mg Intravenous Given 07/26/19 1052)  potassium chloride SA (KLOR-CON) CR tablet 40 mEq (40 mEq Oral Given 07/26/19 1038)  cefTRIAXone (ROCEPHIN) 1 g in sodium chloride 0.9 % 100 mL IVPB (0 g Intravenous Stopped 07/26/19 1310)  azithromycin (ZITHROMAX) 500 mg in sodium chloride 0.9 % 250 mL IVPB (0 mg Intravenous Stopped 07/26/19 1345)    Mobility walks Low fall risk   Focused Assessments Cardiac Assessment Handoff:  Cardiac Rhythm: Atrial fibrillation Lab Results  Component  Value Date   CKTOTAL 55 02/17/2011   CKMB 3.2 02/17/2011   TROPONINI <0.03 04/01/2015   No results found for: DDIMER Does the Patient currently have chest pain? No     R Recommendations: See Admitting Provider Note  Report given to:   Additional Notes: .

## 2019-07-26 NOTE — H&P (Signed)
[facility]                                                                                    Patient Demographics  [name], is a 85 y.o. male  CSN: [id number]  MRN: [id number]  DOB - [birth year]  Admit Date - 07/26/2019  Outpatient Primary MD for the patient is [name], MD   With History of -  Past Medical History:  Diagnosis Date  . Arthritis   . CAD (coronary artery disease)    a. Cath September 2015 LIMA to the LAD patent, SVG to PDA patent, SVG to posterior lateral patent, SVG to OM with a 90% in-stent restenosis at an anastomotic lesion. This was treated with angioplasty. b. cath 04/01/2015 95% ISR in SVG to OM treated with 2.75x24 Synergy DES postdilated to 3.36mm, all other grafts patent  . Cataract    surgery,B/L  . CHF (congestive heart failure) ([place])   . Chronic kidney disease    nephrolithiasis  . Diabetes mellitus    TYPE 2  . GERD (gastroesophageal reflux disease)   . Heart murmur   . Hernia   . Hypercholesterolemia   . Hypertension   . Incontinence    hx  over 1 year,leaks without awareness  . Other and unspecified diseases of appendix   . PAF (paroxysmal atrial fibrillation) ([place])    in the setting of ischemia on 03/31/2015  . Pancreatitis   . Prostate CA ([place]) 03/01/04   prostate bx=Adenocarcinoam,gleason 3+4=7,PSA=6.75  . Shortness of breath dyspnea   . Ulcer    hx gastric      Past Surgical History:  Procedure Laterality Date  . APPENDECTOMY    . BACK SURGERY    . CARDIAC CATHETERIZATION N/A 04/01/2015   Procedure: Left Heart Cath and Cors/Grafts Angiography;  Surgeon: Jettie Booze, MD;  Location: [facility];  Service: Cardiovascular;  Laterality: N/A;  . CARDIAC CATHETERIZATION N/A 04/01/2015   Procedure: Coronary Stent Intervention;  Surgeon: Jettie Booze, MD;  Location: [facility];  Service: Cardiovascular;  Laterality: N/A;  . CARPAL TUNNEL RELEASE  2004   both hands  . CORONARY ARTERY  BYPASS GRAFT    . CYSTOSCOPY  08/23/12   incomplete emoptying bladder  . HERNIA REPAIR    . incision and drainage of right chest abscess    . LAPAROSCOPIC CHOLECYSTECTOMY  09/2009  . LEFT HEART CATHETERIZATION WITH CORONARY ANGIOGRAM N/A 07/19/2014   Procedure: LEFT HEART CATHETERIZATION WITH CORONARY ANGIOGRAM;  Surgeon: Leonie Man, MD;  Location: [facility];  Service: Cardiovascular;  Laterality: N/A;  . RECTAL SURGERY    . ROBOT ASSISTED LAPAROSCOPIC RADICAL PROSTATECTOMY  2005    in for   Chief Complaint  Patient presents with  . Shortness of Breath  . Fever     HPI  [name]  is a 85 y.o. male, with past medical history significant for CAD, diastolic congestive heart failure status post echocardiogram in 03/2018, diabetes mellitus type 2, paroxysmal A. fib on Plavix, prostate cancer who presented to the emergency room with increasing shortness of breath for the last 3 days.  Patient reports fever& dyspnea on exertion, but denies  cough, nausea, vomiting or diarrhea.  No urinary symptoms no exposure to sick contacts with COVID-19 lately. Patient reports episodes of chest pain, retrosternal relieved by nitroglycerin S/L.  Has history of CAD status post CABG x4 in 2009 Work-up in the emergency room showed white blood cell count of 12.7, his chest x-ray showed mild cardiomegaly with edema has been stable at 1.95 which is in line with what he had before.  His troponin from 84 -107 and cardiology was consulted .     Review of Systems    In addition to the HPI above,   No Headache, No changes with Vision or hearing, No problems swallowing food or Liquids, No Cough  No Abdominal pain, No Nausea or Vommitting, Bowel movements are regular, No Blood in stool or Urine, No dysuria, No new skin rashes or bruises, No new joints pains-aches,  No new weakness, tingling, numbness in any extremity, No recent weight gain or loss, No polyuria, polydypsia or polyphagia, No significant  Mental Stressors.  All systems were reviewed and were negative.   Social History Social History   Tobacco Use  . Smoking status: Former Smoker    Packs/day: 0.50    Years: 5.00    Pack years: 2.50    Types: Cigarettes    Quit date: 07/20/1972    Years since quitting: 47.0  . Smokeless tobacco: Never Used  . Tobacco comment: quit s  Substance Use Topics  . Alcohol use: No     Family History Family History  Problem Relation Age of Onset  . Cancer Mother        stomach     Prior to Admission medications   Medication Sig Start Date End Date Taking? Authorizing Provider  amLODipine (NORVASC) 10 MG tablet TAKE 1 TABLET BY MOUTH EVERY DAY 06/26/19   Burchette, Alinda Sierras, MD  carvedilol (COREG) 6.25 MG tablet Take 1 tablet (6.25 mg total) by mouth 2 (two) times daily with a meal. 12/05/18   [name], MD  clopidogrel (PLAVIX) 75 MG tablet Take 1 tablet (75 mg total) by mouth daily. TAKE 1 TABLET BY MOUTH EVERY DAY 03/14/19   Burchette, Alinda Sierras, MD  furosemide (LASIX) 40 MG tablet TAKE 1 TABLET BY MOUTH EVERY DAY 01/26/19   [name], MD  glucose blood (FREESTYLE LITE) test strip 1 each by Other route as needed. Use as instructed     [provider]  JANUVIA 50 MG tablet TAKE 1 TABLET BY MOUTH EVERY DAY 07/21/19   Burchette, Alinda Sierras, MD  Lancets (FREESTYLE) lancets 1 each by Other route as needed. Use as instructed     [provider]  nitroGLYCERIN (NITROSTAT) 0.4 MG SL tablet Place 1 tablet (0.4 mg total) under the tongue every 5 (five) minutes x 3 doses as needed for chest pain (if no relief after 3rd dose, proceed to the ED for an evaluation). 04/17/18   [name], MD  pantoprazole (PROTONIX) 40 MG tablet TAKE 1 TABLET BY MOUTH EVERY DAY 07/26/19   Burchette, Alinda Sierras, MD  rosuvastatin (CRESTOR) 20 MG tablet TAKE 1 TABLET BY MOUTH EVERY DAY 05/08/19   Burchette, Alinda Sierras, MD  spironolactone (ALDACTONE) 25 MG tablet TAKE 1 TABLET BY MOUTH EVERY DAY 05/11/19    [name], MD  traMADol (ULTRAM) 50 MG tablet Take 1 tablet (50 mg total) by mouth every 6 (six) hours as needed. 1-2 every 6 h prn pain 11/04/11  Burchette, Alinda Sierras, MD    Allergies  Allergen Reactions  . Codeine   . Morphine     REACTION: does not like    Physical Exam  Vitals  Blood pressure (!) 162/86, pulse 78, temperature 98.8 F (37.1 C), temperature source Oral, resp. rate (!) 26, height 5\' 9"  (1.753 m), weight 83.9 kg, SpO2 95 %. General appearance well-developed, well-nourished, in no acute distress HEENT no jaundice or pallor, no facial deviation oral thrush Neck supple, no neck vein distention Chest decreased breath sounds at the bases Heart normal O2-D7 with systolic murmur Abdomen soft nontender bowel sounds present Extremities no clubbing cyanosis or edema Neuro grossly nonfocal, patient moving all extremities    Data Review  CBC Recent Labs  Lab 07/26/19 0849  WBC 12.7*  HGB 13.7  HCT 41.3  PLT 130*  MCV 94.1  MCH 31.2  MCHC 33.2  RDW 12.6   ------------------------------------------------------------------------------------------------------------------  Chemistries  Recent Labs  Lab 07/26/19 0849  NA 134*  K 3.9  CL 103  CO2 19*  GLUCOSE 282*  BUN 26*  CREATININE 1.95*  CALCIUM 9.2   ------------------------------------------------------------------------------------------------------------------ estimated creatinine clearance is 28.2 mL/min (A) (by C-G formula based on SCr of 1.95 mg/dL (H)). ------------------------------------------------------------------------------------------------------------------ No results for input(s): TSH, T4TOTAL, T3FREE, THYROIDAB in the last 72 hours.  Invalid input(s): FREET3   Coagulation profile No results for input(s): INR, PROTIME in the last 168 hours. ------------------------------------------------------------------------------------------------------------------- No results for  input(s): DDIMER in the last 72 hours. -------------------------------------------------------------------------------------------------------------------  Cardiac Enzymes No results for input(s): CKMB, TROPONINI, MYOGLOBIN in the last 168 hours.  Invalid input(s): CK ------------------------------------------------------------------------------------------------------------------ Invalid input(s): POCBNP   ---------------------------------------------------------------------------------------------------------------  Urinalysis    Component Value Date/Time   COLORURINE YELLOW 07/26/2019 1111   APPEARANCEUR CLEAR 07/26/2019 1111   LABSPEC 1.009 07/26/2019 1111   PHURINE 6.0 07/26/2019 1111   GLUCOSEU NEGATIVE 07/26/2019 1111   HGBUR NEGATIVE 07/26/2019 1111   HGBUR trace-intact 11/23/2007 0000   BILIRUBINUR NEGATIVE 07/26/2019 1111   KETONESUR NEGATIVE 07/26/2019 1111   PROTEINUR 30 (A) 07/26/2019 1111   UROBILINOGEN 0.2 02/16/2011 2229   NITRITE NEGATIVE 07/26/2019 1111   LEUKOCYTESUR NEGATIVE 07/26/2019 1111    ----------------------------------------------------------------------------------------------------------------   Imaging results:   Dg Chest 2 View  Result Date: 07/26/2019 CLINICAL DATA:  Shortness of breath for 1-2 years. The patient reports recent fevers. EXAM: CHEST - 2 VIEW COMPARISON:  PA and lateral chest 03/09/2016 and 11/01/2018. FINDINGS: There is cardiomegaly and mild interstitial edema. The patient is status post CABG. No consolidative process, pneumothorax or effusion. No acute or focal bony abnormality. IMPRESSION: Cardiomegaly and mild interstitial edema. Atherosclerosis. Electronically Signed   By: Inge Rise M.D.   On: 07/26/2019 09:28    My personal review of EKG: Rhythm NSR, 80 bpm with left bundle branch block  Assessment & Plan  Chest pain?ACS with slightly elevated troponin LBBB Continue with Plavix, Crestor and  beta-blocker Consult cardiology . Patient has a history of CAD status post CABG x4 in 2009  Pulmonary edema with history of diastolic congestive heart failure and history of ischemic cardiomyopathy in 2015 Continue with Lasix IV  Fever with elevated white blood cell count Negative urinalysis Probably pneumonia Continue with Rocephin and Zithromax  COVID-19 test , Negative  Hypertension Hold Norvasc in view of diuresis and fluid restriction  Hyperlipidemia Continue with Crestor  History of prostate CA  Stable  Diabetes mellitus type 2 Continue with linagliptin and ISS  History of carotid artery disease  Chronic renal  insufficiency with no significant change   DVT Prophylaxis Lovenox  AM Labs Ordered, also please review Full Orders  Family Communication: D/W wife [name].  Code Status full  Disposition Plan: Home  Time spent in minutes : 48 minutes  Condition GUARDED   @SIGNATURE @

## 2019-07-26 NOTE — ED Triage Notes (Signed)
Pt reports SOB for a year or two and recent fevers. Pt reports the SOB is getting worse and he can no longer walk or exert himself like he was once able. Pt reports fevers over the last 2 days with the highest being 101.0. Pt presents a-febrile today.

## 2019-07-26 NOTE — Telephone Encounter (Signed)
Will monitor for ED arrival.  

## 2019-07-26 NOTE — ED Provider Notes (Signed)
[facility] EMERGENCY DEPARTMENT Provider Note   CSN: [id number] Arrival date & time: 07/26/19  0867     History   Chief Complaint Chief Complaint  Patient presents with  . Shortness of Breath  . Fever    HPI [name] is a 85 y.o. male past medical history significant for CAD, CHF, type 2 diabetes, hypertension, paroxysmal A. Fib on plavix, prostate cancer presents emergency department today with chief complaint of fever and shortness of breath.  Patient states he has been feeling short of breath for the last 2 years however it has progressively worsened over the last 2 days.  He is unable to walk more than a couple feet before he has to stop to catch his breath.  This is new for him.  He reports T-max of 100.1 yesterday at home.  He did not take any medications for his symptoms prior to arrival.  Does not wear home O2.  Does admit to associated abdominal pain.  He describes it as his muscles are sore from breathing so hard.  Pain is located in bilateral upper quadrants, does not radiate.  He denies congestion, cough, chest pain, urinary frequency, gross hematuria, diarrhea, lower extremity edema, palpitations.  Denies any sick contacts or contact with anyone known to have COVID-19.  Echo in 04/06/2018 shows LVEF of 55 to 60%. History provided by patient with additional history obtained from chart review.     Past Medical History:  Diagnosis Date  . Arthritis   . CAD (coronary artery disease)    a. Cath September 2015 LIMA to the LAD patent, SVG to PDA patent, SVG to posterior lateral patent, SVG to OM with a 90% in-stent restenosis at an anastomotic lesion. This was treated with angioplasty. b. cath 04/01/2015 95% ISR in SVG to OM treated with 2.75x24 Synergy DES postdilated to 3.41mm, all other grafts patent  . Cataract    surgery,B/L  . CHF (congestive heart failure) ([place])   . Chronic kidney disease    nephrolithiasis  . Diabetes mellitus    TYPE 2  . GERD  (gastroesophageal reflux disease)   . Heart murmur   . Hernia   . Hypercholesterolemia   . Hypertension   . Incontinence    hx  over 1 year,leaks without awareness  . Other and unspecified diseases of appendix   . PAF (paroxysmal atrial fibrillation) ([place])    in the setting of ischemia on 03/31/2015  . Pancreatitis   . Prostate CA ([place]) 03/01/04   prostate bx=Adenocarcinoam,gleason 3+4=7,PSA=6.75  . Shortness of breath dyspnea   . Ulcer    hx gastric    Patient Active Problem List   Diagnosis Date Noted  . SOB (shortness of breath) 06/04/2019  . CKD (chronic kidney disease), stage III 06/04/2019  . Coronary artery disease of native artery of native heart with stable angina pectoris ([place]) 03/24/2018  . Bilateral carotid artery stenosis 07/23/2017  . Palpitation 07/23/2017  . Urinary urgency 07/28/2016  . PAF (paroxysmal atrial fibrillation) ([place])   . DOE (dyspnea on exertion) 07/17/2014  . Acute on chronic renal insufficiency- ACE and diuretics held 07/17/2014  . Diastolic dysfunction- moderate on echo 2013 07/17/2014  . Aortic stenosis, mild by echo 2013 07/17/2014  . LBBB (left bundle branch block) 07/17/2014  . Chest pain 07/17/2014  . Unstable angina ([place]) 07/16/2014  . CKD (chronic kidney disease) stage 4, GFR 15-29 ml/min (H[facility]) 12/03/2013  . Squamous cell cancer of skin of hand 05/31/2013  .  HEAD TRAUMA, CLOSED 09/24/2010  . Ischemic cardiomyopathy- last EF Normal Myoview Jan 2015 04/02/2010  . INSOMNIA 04/02/2010  . Moderate bilateral ICA disease by doppler June 2015 01/08/2010  . DYSPHAGIA UNSPECIFIED 09/05/2009  . Hx GERD/PUD 09/03/2009  . CHOLELITHIASIS, WITH CHOLECYSTITIS 09/03/2009  . ARTHRITIS 07/16/2009  . HYPOKALEMIA 11/06/2008  . CAD (coronary artery disease) of bypass graft 11/06/2008  . 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 ([place]) 11/23/2007  . Dyslipidemia 11/23/2007  . ROSACEA 11/23/2007  . DYSPNEA ON EXERTION 11/23/2007  .  UNS ADVRS EFF OTH RX MEDICINAL&BIOLOGICAL SBSTNC 11/23/2007  . Essential hypertension 11/22/2007  . Hx of Prostate ca 03/01/2004    Past Surgical History:  Procedure Laterality Date  . APPENDECTOMY    . BACK SURGERY    . CARDIAC CATHETERIZATION N/A 04/01/2015   Procedure: Left Heart Cath and Cors/Grafts Angiography;  Surgeon: Jettie Booze, MD;  Location: [place] [facility] CV LAB;  Service: Cardiovascular;  Laterality: N/A;  . CARDIAC CATHETERIZATION N/A 04/01/2015   Procedure: Coronary Stent Intervention;  Surgeon: Jettie Booze, MD;  Location: [place] CV LAB;  Service: Cardiovascular;  Laterality: N/A;  . CARPAL TUNNEL RELEASE  2004   both hands  . CORONARY ARTERY BYPASS GRAFT    . CYSTOSCOPY  08/23/12   incomplete emoptying bladder  . HERNIA REPAIR    . incision and drainage of right chest abscess    . LAPAROSCOPIC CHOLECYSTECTOMY  09/2009  . LEFT HEART CATHETERIZATION WITH CORONARY ANGIOGRAM N/A 07/19/2014   Procedure: LEFT HEART CATHETERIZATION WITH CORONARY ANGIOGRAM;  Surgeon: Leonie Man, MD;  Location[facility] CATH LAB;  Service: Cardiovascular;  Laterality: N/A;  . RECTAL SURGERY    . ROBOT ASSISTED LAPAROSCOPIC RADICAL PROSTATECTOMY  2005        Home Medications    Prior to Admission medications   Medication Sig Start Date End Date Taking? Authorizing Provider  amLODipine (NORVASC) 10 MG tablet TAKE 1 TABLET BY MOUTH EVERY DAY 06/26/19   Burchette, Alinda Sierras, MD  carvedilol (COREG) 6.25 MG tablet Take 1 tablet (6.25 mg total) by mouth 2 (two) times daily with a meal. 12/05/18   [name], MD  clopidogrel (PLAVIX) 75 MG tablet Take 1 tablet (75 mg total) by mouth daily. TAKE 1 TABLET BY MOUTH EVERY DAY 03/14/19   Burchette, Alinda Sierras, MD  furosemide (LASIX) 40 MG tablet TAKE 1 TABLET BY MOUTH EVERY DAY 01/26/19   [name], MD  glucose blood (FREESTYLE LITE) test strip 1 each by Other route as needed. Use as instructed     [provider]  JANUVIA 50  MG tablet TAKE 1 TABLET BY MOUTH EVERY DAY 07/21/19   Burchette, Alinda Sierras, MD  Lancets (FREESTYLE) lancets 1 each by Other route as needed. Use as instructed     [provider]  nitroGLYCERIN (NITROSTAT) 0.4 MG SL tablet Place 1 tablet (0.4 mg total) under the tongue every 5 (five) minutes x 3 doses as needed for chest pain (if no relief after 3rd dose, proceed to the ED for an evaluation). 04/17/18   [name], MD  pantoprazole (PROTONIX) 40 MG tablet TAKE 1 TABLET BY MOUTH EVERY DAY 07/26/19   Burchette, Alinda Sierras, MD  rosuvastatin (CRESTOR) 20 MG tablet TAKE 1 TABLET BY MOUTH EVERY DAY 05/08/19   Burchette, Alinda Sierras, MD  spironolactone (ALDACTONE) 25 MG tablet TAKE 1 TABLET BY MOUTH EVERY DAY 05/11/19   [name], MD  traMADol (ULTRAM) 50 MG tablet Take  1 tablet (50 mg total) by mouth every 6 (six) hours as needed. 1-2 every 6 h prn pain 11/04/11   Burchette, Alinda Sierras, MD    Family History Family History  Problem Relation Age of Onset  . Cancer Mother        stomach    Social History Social History   Tobacco Use  . Smoking status: Former Smoker    Packs/day: 0.50    Years: 5.00    Pack years: 2.50    Types: Cigarettes    Quit date: 07/20/1972    Years since quitting: 47.0  . Smokeless tobacco: Never Used  . Tobacco comment: quit s  Substance Use Topics  . Alcohol use: No  . Drug use: No    Comment: quit smoking 40 years ago     Allergies   Codeine and Morphine   Review of Systems Review of Systems  Constitutional: Positive for fever. Negative for chills.  HENT: Negative for congestion, rhinorrhea, sinus pressure and sore throat.   Eyes: Negative for pain and redness.  Respiratory: Positive for shortness of breath and wheezing. Negative for cough.   Cardiovascular: Negative for chest pain, palpitations and leg swelling.  Gastrointestinal: Negative for abdominal pain, constipation, diarrhea, nausea and vomiting.  Genitourinary: Negative for dysuria, frequency  and hematuria.  Musculoskeletal: Negative for arthralgias, back pain, myalgias and neck pain.  Skin: Negative for rash and wound.  Neurological: Negative for dizziness, syncope, weakness, numbness and headaches.  Psychiatric/Behavioral: Negative for confusion.     Physical Exam Updated Vital Signs BP (!) 155/70   Pulse 74   Temp 98.8 F (37.1 C) (Oral)   Resp (!) 25   Ht 5\' 9"  (1.753 m)   Wt 83.9 kg   SpO2 92%   BMI 27.32 kg/m   Physical Exam Vitals signs and nursing note reviewed.  Constitutional:      General: He is in acute distress.     Appearance: He is not ill-appearing.  HENT:     Head: Normocephalic and atraumatic.     Right Ear: Tympanic membrane and external ear normal.     Left Ear: Tympanic membrane and external ear normal.     Nose: Nose normal.     Mouth/Throat:     Mouth: Mucous membranes are moist.     Pharynx: Oropharynx is clear.  Eyes:     General: No scleral icterus.       Right eye: No discharge.        Left eye: No discharge.     Extraocular Movements: Extraocular movements intact.     Conjunctiva/sclera: Conjunctivae normal.     Pupils: Pupils are equal, round, and reactive to light.  Neck:     Musculoskeletal: Normal range of motion.     Vascular: No JVD.  Cardiovascular:     Rate and Rhythm: Normal rate and regular rhythm.     Pulses: Normal pulses.          Radial pulses are 2+ on the right side and 2+ on the left side.     Heart sounds: Normal heart sounds.  Pulmonary:     Comments: Rales heard in bilateral lung bases.  On exam he is tachypneic.  SPO2 on room air is 89%.  On 2 L [place] increases to 95%.  He has accessory muscle use.  He is speaking in short sentences. Chest:     Chest wall: No tenderness.  Abdominal:     Comments: Abdomen is soft,  non-distended, and non-tender in all quadrants. No rigidity, no guarding. No peritoneal signs.  Musculoskeletal: Normal range of motion.  Skin:    General: Skin is warm and dry.     Capillary  Refill: Capillary refill takes less than 2 seconds.  Neurological:     Mental Status: He is oriented to person, place, and time.     GCS: GCS eye subscore is 4. GCS verbal subscore is 5. GCS motor subscore is 6.     Comments: Fluent speech, no facial droop.  Psychiatric:        Behavior: Behavior normal.      ED Treatments / Results  Labs (all labs ordered are listed, but only abnormal results are displayed) Labs Reviewed  BASIC METABOLIC PANEL - Abnormal; Notable for the following components:      Result Value   Sodium 134 (*)    CO2 19 (*)    Glucose, Bld 282 (*)    BUN 26 (*)    Creatinine, Ser 1.95 (*)    GFR calc non Af Amer 31 (*)    GFR calc Af Amer 36 (*)    All other components within normal limits  CBC - Abnormal; Notable for the following components:   WBC 12.7 (*)    Platelets 130 (*)    All other components within normal limits  BRAIN NATRIURETIC PEPTIDE - Abnormal; Notable for the following components:   B Natriuretic Peptide 441.0 (*)    All other components within normal limits  URINALYSIS, ROUTINE W REFLEX MICROSCOPIC - Abnormal; Notable for the following components:   Protein, ur 30 (*)    All other components within normal limits  TROPONIN I (HIGH SENSITIVITY) - Abnormal; Notable for the following components:   Troponin I (High Sensitivity) 84 (*)    All other components within normal limits  TROPONIN I (HIGH SENSITIVITY) - Abnormal; Notable for the following components:   Troponin I (High Sensitivity) 107 (*)    All other components within normal limits  SARS CORONAVIRUS 2 (HOSPITAL ORDER, [place] LAB)  URINE CULTURE    EKG EKG Interpretation  Date/Time:  Thursday July 26 2019 08:32:58 EDT Ventricular Rate:  80 PR Interval:    QRS Duration: 148 QT Interval:  414 QTC Calculation: 477 R Axis:   108 Text Interpretation:  suspect sinus rhythm Rightward axis Non-specific intra-ventricular conduction block Abnormal ECG  Confirmed by Virgel Manifold [phone]) on 07/26/2019 10:04:42 AM   Radiology Dg Chest 2 View  Result Date: 07/26/2019 CLINICAL DATA:  Shortness of breath for 1-2 years. The patient reports recent fevers. EXAM: CHEST - 2 VIEW COMPARISON:  PA and lateral chest 03/09/2016 and 11/01/2018. FINDINGS: There is cardiomegaly and mild interstitial edema. The patient is status post CABG. No consolidative process, pneumothorax or effusion. No acute or focal bony abnormality. IMPRESSION: Cardiomegaly and mild interstitial edema. Atherosclerosis. Electronically Signed   By: Inge Rise M.D.   On: 07/26/2019 09:28    Procedures Procedures (including critical care time)  Medications Ordered in ED Medications  cefTRIAXone (ROCEPHIN) 1 g in sodium chloride 0.9 % 100 mL IVPB (has no administration in time range)  azithromycin (ZITHROMAX) 500 mg in sodium chloride 0.9 % 250 mL IVPB (has no administration in time range)  sodium chloride flush (NS) 0.9 % injection 3 mL (3 mLs Intravenous Given 07/26/19 1100)  furosemide (LASIX) injection 80 mg (80 mg Intravenous Given 07/26/19 1052)  potassium chloride SA (KLOR-CON) CR tablet 40 mEq (40  mEq Oral Given 07/26/19 1038)     Initial Impression / Assessment and Plan / ED Course  I have reviewed the triage vital signs and the nursing notes.  Pertinent labs & imaging results that were available during my care of the patient were reviewed by me and considered in my medical decision making (see chart for details).  Patient seen and examined. Patient nontoxic appearing, but appears to be in respiratory distress. He is tachypneic, and hypoxic to 89% on room air, does not wear O2 at home. Put on 2L [place] with improvement to 95%. Rales heard in bilateral lung bases and he has accessory muscle use. Abdomen is non tender. No lower extremity edema. I viewed labs ordered in triage. CBC with leukocytosis of 12.7, thrombocytopenic to 130 with history of similar. BMP shows  BUN/Creatinine 26/1.95 appears close to baseline. UA without signs of infection. BNP elevated to 441.0 Chest cxray shows mild interstitial edema. Given his reported fever and home, leukocytosis, interstitial edema, rales on exam will start on antibiotics for possible pneumonia. Also gave 80 mg lasix IV and potassium to diurese. He has had small urine ouput after lasix. Pt denies chest pain. Troponin ordered in triage is 84 and second is 107, suspect demand. EKG is without ischemic changes. Covid is negative.   This case was discussed with Dr. Wilson Singer who has seen the patient and agrees with plan to admit.  Spoke with Dr. Laren Everts with hospitalist service who agrees to assume care of patient and bring into the hospital for further evaluation and management. Cardiology consulted, they ask if consult is needed please have hospitalist consult after admission.   Portions of this note were generated with [employer]. Dictation errors may occur despite best attempts at proofreading.    Final Clinical Impressions(s) / ED Diagnoses   Final diagnoses:  Hypoxia  Acute on chronic congestive heart failure, unspecified heart failure type [facility])    ED Discharge Orders    None       Cherre Robins, PA-C 07/26/19 1354    Virgel Manifold, MD 07/27/19 [phone]

## 2019-07-27 ENCOUNTER — Inpatient Hospital Stay (HOSPITAL_COMMUNITY): Payer: Medicare Other

## 2019-07-27 DIAGNOSIS — R778 Other specified abnormalities of plasma proteins: Secondary | ICD-10-CM | POA: Diagnosis present

## 2019-07-27 DIAGNOSIS — I4819 Other persistent atrial fibrillation: Secondary | ICD-10-CM

## 2019-07-27 DIAGNOSIS — E785 Hyperlipidemia, unspecified: Secondary | ICD-10-CM | POA: Diagnosis present

## 2019-07-27 DIAGNOSIS — K219 Gastro-esophageal reflux disease without esophagitis: Secondary | ICD-10-CM

## 2019-07-27 DIAGNOSIS — N184 Chronic kidney disease, stage 4 (severe): Secondary | ICD-10-CM

## 2019-07-27 DIAGNOSIS — I350 Nonrheumatic aortic (valve) stenosis: Secondary | ICD-10-CM

## 2019-07-27 DIAGNOSIS — I5033 Acute on chronic diastolic (congestive) heart failure: Secondary | ICD-10-CM

## 2019-07-27 LAB — CBC
HCT: 38.2 % — ABNORMAL LOW (ref 39.0–52.0)
Hemoglobin: 13.4 g/dL (ref 13.0–17.0)
MCH: 32.1 pg (ref 26.0–34.0)
MCHC: 35.1 g/dL (ref 30.0–36.0)
MCV: 91.4 fL (ref 80.0–100.0)
Platelets: 187 10*3/uL (ref 150–400)
RBC: 4.18 MIL/uL — ABNORMAL LOW (ref 4.22–5.81)
RDW: 12.6 % (ref 11.5–15.5)
WBC: 12.1 10*3/uL — ABNORMAL HIGH (ref 4.0–10.5)
nRBC: 0 % (ref 0.0–0.2)

## 2019-07-27 LAB — BASIC METABOLIC PANEL
Anion gap: 13 (ref 5–15)
BUN: 29 mg/dL — ABNORMAL HIGH (ref 8–23)
CO2: 23 mmol/L (ref 22–32)
Calcium: 9.4 mg/dL (ref 8.9–10.3)
Chloride: 101 mmol/L (ref 98–111)
Creatinine, Ser: 2.09 mg/dL — ABNORMAL HIGH (ref 0.61–1.24)
GFR calc Af Amer: 33 mL/min — ABNORMAL LOW (ref >60)
GFR calc non Af Amer: 28 mL/min — ABNORMAL LOW (ref >60)
Glucose, Bld: 134 mg/dL — ABNORMAL HIGH (ref 70–99)
Potassium: 4.1 mmol/L (ref 3.5–5.1)
Sodium: 137 mmol/L (ref 135–145)

## 2019-07-27 LAB — TROPONIN I (HIGH SENSITIVITY): Troponin I (High Sensitivity): 143 ng/L (ref <18)

## 2019-07-27 LAB — URINE CULTURE: Culture: <10000 — AB

## 2019-07-27 LAB — ECHOCARDIOGRAM COMPLETE
Height: 69 in
Weight: 2897.6 oz

## 2019-07-27 LAB — GLUCOSE, CAPILLARY
Glucose-Capillary: 125 mg/dL — ABNORMAL HIGH (ref 70–99)
Glucose-Capillary: 197 mg/dL — ABNORMAL HIGH (ref 70–99)
Glucose-Capillary: 147 mg/dL — ABNORMAL HIGH (ref 70–99)
Glucose-Capillary: 167 mg/dL — ABNORMAL HIGH (ref 70–99)

## 2019-07-27 LAB — LEGIONELLA PNEUMOPHILA SEROGP 1 UR AG: L. pneumophila Serogp 1 Ur Ag: NEGATIVE

## 2019-07-27 MED ORDER — ALBUTEROL SULFATE (2.5 MG/3ML) 0.083% IN NEBU: 2.5000 mg | INHALATION_SOLUTION | RESPIRATORY_TRACT | Status: DC | PRN

## 2019-07-27 MED ORDER — TRAMADOL HCL 50 MG PO TABS
50.0000 mg | ORAL_TABLET | Freq: Four times a day (QID) | ORAL | Status: DC | PRN
  Administered 2019-07-27: 100 mg via ORAL
  Filled 2019-07-27: qty 2

## 2019-07-27 MED ORDER — AMLODIPINE BESYLATE 10 MG PO TABS: 10.0000 mg | ORAL_TABLET | Freq: Every day | ORAL | Status: DC

## 2019-07-27 MED ORDER — FUROSEMIDE 40 MG PO TABS
40.0000 mg | ORAL_TABLET | Freq: Two times a day (BID) | ORAL | Status: DC
  Administered 2019-07-27 – 2019-07-28 (×2): 40 mg via ORAL
  Filled 2019-07-27 (×2): qty 1

## 2019-07-27 MED ORDER — AMLODIPINE BESYLATE 5 MG PO TABS
5.0000 mg | ORAL_TABLET | Freq: Every day | ORAL | Status: DC
  Administered 2019-07-28: 5 mg via ORAL
  Filled 2019-07-27: qty 1

## 2019-07-27 MED ORDER — SODIUM CHLORIDE 0.9 % IV SOLN: 500.0000 mg | INTRAVENOUS | Status: DC

## 2019-07-27 MED ORDER — AZITHROMYCIN 250 MG PO TABS: 250.0000 mg | ORAL_TABLET | Freq: Every day | ORAL | Status: DC

## 2019-07-27 MED ORDER — APIXABAN 2.5 MG PO TABS
2.5000 mg | ORAL_TABLET | Freq: Two times a day (BID) | ORAL | Status: DC
  Administered 2019-07-27 – 2019-07-28 (×3): 2.5 mg via ORAL
  Filled 2019-07-27 (×3): qty 1

## 2019-07-27 MED ORDER — ALBUTEROL SULFATE HFA 108 (90 BASE) MCG/ACT IN AERS: 2.0000 | INHALATION_SPRAY | RESPIRATORY_TRACT | Status: DC | PRN

## 2019-07-27 MED ORDER — SODIUM CHLORIDE 0.9 % IV SOLN: 1.0000 g | INTRAVENOUS | Status: DC

## 2019-07-27 NOTE — Plan of Care (Signed)
  Problem: Clinical Measurements: Goal: Will remain free from infection Outcome: Progressing   Problem: Activity: Goal: Risk for activity intolerance will decrease Outcome: Progressing   Problem: Nutrition: Goal: Adequate nutrition will be maintained Outcome: Progressing   Problem: Elimination: Goal: Will not experience complications related to bowel motility Outcome: Progressing   Problem: Safety: Goal: Ability to remain free from injury will improve Outcome: Progressing   Problem: Skin Integrity: Goal: Risk for impaired skin integrity will decrease Outcome: Progressing   Problem: Education: Goal: Ability to demonstrate management of disease process will improve Outcome: Progressing Goal: Ability to verbalize understanding of medication therapies will improve Outcome: Progressing Note: Patient understands what medications helps with his irregular rhythm and the importance of diet and fluid control when taking diuretics.    Problem: Cardiac: Goal: Ability to achieve and maintain adequate cardiopulmonary perfusion will improve Outcome: Progressing

## 2019-07-27 NOTE — Progress Notes (Addendum)
Progress Note  Patient Name: [name] Date of Encounter: 07/27/2019  Primary Cardiologist: [name], MD  Subjective   Hearing aid battery died, so very hard of hearing. Despite this he is cheerful and in good spirits this AM, saying he feels much better. Denies further SOB or coughing. Afebrile. He has felt intermittent palpitations in the past but is generally unaware of current afib.  Inpatient Medications    Scheduled Meds: . carvedilol  6.25 mg Oral BID WC  . clopidogrel  75 mg Oral Daily  . enoxaparin (LOVENOX) injection  30 mg Subcutaneous Q24H  . furosemide  40 mg Intravenous BID  . insulin aspart  0-5 Units Subcutaneous QHS  . insulin aspart  0-9 Units Subcutaneous TID WC  . linagliptin  5 mg Oral Daily  . pantoprazole  40 mg Oral Daily  . rosuvastatin  20 mg Oral Daily  . sodium chloride flush  3 mL Intravenous Q12H  . spironolactone  25 mg Oral Daily   Continuous Infusions: . sodium chloride    . azithromycin    . cefTRIAXone (ROCEPHIN)  IV     PRN Meds: sodium chloride, acetaminophen, nitroGLYCERIN, ondansetron **OR** ondansetron (ZOFRAN) IV, sodium chloride flush   Vital Signs    Vitals:   07/26/19 2050 07/27/19 0137 07/27/19 0553 07/27/19 0752  BP: 123/62 (!) 145/88 (!) 128/59 139/78  Pulse: 66 70 68 66  Resp: 18 18 16 18   Temp: 98.8 F (37.1 C) 98 F (36.7 C) 97.9 F (36.6 C) 98.6 F (37 C)  TempSrc: Oral Oral Oral Oral  SpO2: 97% 95% 97% 96%  Weight:  82.1 kg    Height:        Intake/Output Summary (Last 24 hours) at 07/27/2019 0854 Last data filed at 07/27/2019 4132 Gross per 24 hour  Intake 806 ml  Output 2020 ml  Net -1214 ml   Last 3 Weights 07/27/2019 07/26/2019 07/26/2019  Weight (lbs) 181 lb 1.6 oz 184 lb 3.2 oz 185 lb  Weight (kg) 82.146 kg 83.553 kg 83.915 kg     Telemetry    Appears to be Afib with intermittent PVCs - Personally Reviewed  ECG    Read as NSR by ED but appears to be AF with LBBB - repeat pending -  Personally Reviewed  Physical Exam   GEN: No acute distress.  HEENT: Normocephalic, atraumatic, sclera non-icteric. Neck: No JVD or bruits. Cardiac: RRR, very soft SEM, no rubs or gallops.  Radials/DP/PT 1+ and equal bilaterally.  Respiratory: Very faint end expiratory wheeze at bases but no rales or rhonchi. Breathing is unlabored. GI: Soft, nontender, non-distended, BS +x 4. MS: no deformity. Extremities: No clubbing or cyanosis. No edema. Distal pedal pulses are 2+ and equal bilaterally. Neuro:  AAOx3. Follows commands. Hard of hearing Psych:  Responds to questions appropriately with a normal affect.  Labs    High Sensitivity Troponin:   Recent Labs  Lab 07/26/19 0849 07/26/19 1051 07/27/19 0508  TROPONINIHS 84* 107* 143*      Cardiac EnzymesNo results for input(s): TROPONINI in the last 168 hours. No results for input(s): TROPIPOC in the last 168 hours.   Chemistry Recent Labs  Lab 07/26/19 0849 07/27/19 0508  NA 134* 137  K 3.9 4.1  CL 103 101  CO2 19* 23  GLUCOSE 282* 134*  BUN 26* 29*  CREATININE 1.95* 2.09*  CALCIUM 9.2 9.4  GFRNONAA 31* 28*  GFRAA 36* 33*  ANIONGAP 12 13  Hematology Recent Labs  Lab 07/26/19 0849 07/27/19 0741  WBC 12.7* 12.1*  RBC 4.39 4.18*  HGB 13.7 13.4  HCT 41.3 38.2*  MCV 94.1 91.4  MCH 31.2 32.1  MCHC 33.2 35.1  RDW 12.6 12.6  PLT 130* 187    BNP Recent Labs  Lab 07/26/19 0849  BNP 441.0*     DDimer No results for input(s): DDIMER in the last 168 hours.   Radiology    Dg Chest 2 View  Result Date: 07/26/2019 CLINICAL DATA:  Shortness of breath for 1-2 years. The patient reports recent fevers. EXAM: CHEST - 2 VIEW COMPARISON:  PA and lateral chest 03/09/2016 and 11/01/2018. FINDINGS: There is cardiomegaly and mild interstitial edema. The patient is status post CABG. No consolidative process, pneumothorax or effusion. No acute or focal bony abnormality. IMPRESSION: Cardiomegaly and mild interstitial edema.  Atherosclerosis. Electronically Signed   By: Inge Rise M.D.   On: 07/26/2019 09:28    Cardiac Studies   2D echo 03/2018 ------------------------------------------------------------------- Study Conclusions  - Left ventricle: The cavity size was normal. Wall thickness was   increased in a pattern of moderate LVH. Systolic function was   normal. The estimated ejection fraction was in the range of 55%   to 60%. Wall motion was normal; there were no regional wall   motion abnormalities. Doppler parameters are consistent with   abnormal left ventricular relaxation (grade 1 diastolic   dysfunction). - Aortic valve: Valve mobility was restricted. There was mild   stenosis. Peak velocity (S): 214 cm/s. Mean gradient (S): 10 mm   Hg. - Left atrium: The atrium was mildly dilated.  Patient Profile     85 y.o. male with h/o CAD s/p CABG 2009 and PCI (last in 2016), hypertension, hyperlipidemia, diabetes, chronic combined systolic and diastolic heart failure (EF 30% in 2009, improved to 55-60%), mild AS, PAF (at time of cath in 2016 without recurrence as OP), prostate cancer, LBBB, prostate CA, CKD stage III-IV, carotid artery disease (8-11% RICA, 91-47% LICA) admitted with exertional dyspnea, weight gain, fever, and non-productive cough. He was found to have acute on chronic systolic and diastolic heart failure, pneumonia, and mildly elevated troponin.  Assessment & Plan    1. Acute on chronic dyspnea likely multifactorial in setting of fever/cough with possible CAP and also CHF - tested negative for Covid-19. He is on antibiotics per IM. Wt down 4lb, -1.4L UOP.  2. Acute on chronic combined CHF - normalized EF in last few years. 2D echo pending. Received 120mg  IV Lasix yesterday and 40mg  IV Lasix this AM. Suspect we can switch to oral form but will review with Dr. Oval Linsey. He has mild wheezing on exam. If this persists consideration could be given to changing carvedilol to Toprol but  hopefully this will improve with rx for PNA.  3. Paroxysmal atrial fib  - admit EKG suspicious for AF, telemetry as well. Have asked floor to repeat EKG this AM. Not on [place] prior to admission as he'd only had one prior isolated episode. EKG 10/2018 showed what appears to be sinus brady with low P wave amplitude, so unclear how long he has been out of rhythm for. His rate is controlled. CHADSVASC 5 so need to think about switching Plavix to renally dosed Eliquis. Pt denies any bleeding issues while on Plavix. Will review with MD. Add thyroid to AM labs. Addendum: EKG confirms continued Afib 74bpm with known LBBB.  4. Mild aortic stenosis by echo 03/2018 - repeat echo  pending. Murmur is soft with preserved S2.  5. CKD stage III-IV - baseline Cr appears 1.6-2 in recent years. This admission 1.95 -> 2.09. Check daily BMET.  6. Elevated hsTroponin in context of above, + hx of CAD s/p CABG/PCI - suspected demand ischemia. Maintained on chronic Plavix, BB, statin. No plans for ischemic eval per Dr. Oval Linsey. F/u echocardiogram.  7. HTN - BP minimally elevated at times. Home amlodipine held by primary team. Can consider resuming at lower dose.  For questions or updates, please contact [place] Please consult [email or web address] for contact info under Cardiology/STEMI.  Signed, Charlie Pitter, PA-C 07/27/2019, 8:54 AM

## 2019-07-27 NOTE — Progress Notes (Signed)
  Echocardiogram 2D Echocardiogram has been performed.  Darlina Sicilian M 07/27/2019, 1:07 PM

## 2019-07-27 NOTE — Progress Notes (Addendum)
PROGRESS NOTE    [name]  QMV:[id number] DOB: [birth year] DOA: 07/26/2019 PCP: [name], MD    Brief Narrative:   [name]  is a 85 y.o. male, with past medical history significant for CAD, diastolic congestive heart failure status post echocardiogram in 03/2018, diabetes mellitus type 2, paroxysmal A. fib on Plavix, prostate cancer who presented to the emergency room with increasing shortness of breath for the last 3 days.  Patient reports fever& dyspnea on exertion, but denies  cough, nausea, vomiting or diarrhea.  No urinary symptoms no exposure to sick contacts with COVID-19 lately.  Patient reports episodes of chest pain, retrosternal relieved by nitroglycerin S/L.  Has history of CAD status post CABG x4 in 2009. Work-up in the emergency room showed white blood cell count of 12.7, his chest x-ray showed mild cardiomegaly with edema has been stable at 1.95 which is in line with what he had before.  His troponin from 84 -107 and cardiology was consulted .  Interim History: 1. Trop 84-->143. Currently no CP or SOB. Card was consulted. Suspected demand ischemia. Maintained on chronic Plavix, BB, statin. No plans for ischemic eval per Dr. Oval Linsey.   2. F/u echocardiogram: EF 60 to 65%. The left ventricle has normal function. There is severely increased left ventricular hypertrophy. Normal left ventricular size. Spectral Doppler shows Left ventricular diastolic Doppler parameters are consistent with pseudonormalization pattern of LV diastolic filling. Elevated mean left atrial pressure.   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  2D echo on 04/15/2018 showed EF 55-60% with grade 1 diastolic dysfunction.  The repeated 2D echo showed EF 60-65 today.  Patient has an elevated BNP 441. CXR showed interstitial edema, consistent with CHF exacerbation.  Patient has a mild leukocytosis with WBC 12.7, but no fever.  He has mild wheezing on auscultation, indicating possible bronchitis change.  Does not seem to have pneumonia.  Patient was started on Rocephin and azithromycin, will discontinue both and change to oral azithromycin.  Follow-up of blood culture.  Clear urine antigen of strep and Legionella negative. Negative Covid19. Pt was started with IV lasix, which is switched to Lasix to 40 mg p.o. twice daily by card given that his creatinine starting to increase.   -continue lasix 40 mg po bid -Highly appreciate cardiology's consultation and recommendations. -albuterol inhaler prn -f/u Bx -continue oral azithromycin  Elevated hsTroponin and hx of  CAD s/p CABG/PCI:  No CP.  Likely demand ischemia per car -Coreg and Crestor -prn NTG  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 ([place]): blood sugar 125-197 -SSI  - home linagliptin  CKD stage IV: Baseline Cr appears 1.6-2.0 in recent years. This admission 1.95 -> 2.09.  -Check daily BMP  HTN: bp 128/59 - Home amlodipine -->decreased dose from 10 to 5 mg daily -Coreg  GERD: -Protonix  HLD: -Crestor.    Anti-infectives (From admission, onward)  Start     Dose/Rate Route Frequency Ordered Stop   07/28/19 1000  azithromycin (ZITHROMAX) tablet 250 mg  Status:  Discontinued     250 mg Oral Daily 07/27/19 1957 07/27/19 2012   07/27/19 2015  cefTRIAXone (ROCEPHIN) 1 g in sodium chloride  0.9 % 100 mL IVPB     1 g 200 mL/hr over 30 Minutes Intravenous Every 24 hours 07/27/19 2010     07/27/19 2015  azithromycin (ZITHROMAX) 500 mg in sodium chloride 0.9 % 250 mL IVPB     500 mg 250 mL/hr over 60 Minutes Intravenous Every 24 hours 07/27/19 2010     07/27/19 1200  cefTRIAXone (ROCEPHIN) 1 g in sodium chloride 0.9 % 100 mL IVPB  Status:  Discontinued     1 g 200 mL/hr over 30 Minutes Intravenous Every 24 hours 07/26/19 2017 07/27/19 1951   07/27/19 1200  azithromycin (ZITHROMAX) 500 mg in sodium chloride 0.9 % 250 mL IVPB  Status:  Discontinued     500 mg 250 mL/hr over 60 Minutes Intravenous Every 24 hours 07/26/19 2017 07/27/19 1951   07/26/19 1200  cefTRIAXone (ROCEPHIN) 1 g in sodium chloride 0.9 % 100 mL IVPB     1 g 200 mL/hr over 30 Minutes Intravenous  Once 07/26/19 1148 07/26/19 1310   07/26/19 1200  azithromycin (ZITHROMAX) 500 mg in sodium chloride 0.9 % 250 mL IVPB     500 mg 250 mL/hr over 60 Minutes Intravenous  Once 07/26/19 1148 07/26/19 1345      DVT prophylaxis: on Eliquis Code Status: fall Family Communication: family in not at bedside Disposition Plan and Barriers for discharge:  Pt has acute on chronic diet CHF exacerbation and also elevated troponin.  He will need to be diuresed gently due to CKD 4.  Patient also has leukocytosis, cannot completely rule out infection.  Will need to trend leukocytosis and monitor fever. Pt has not improved to be discharged.    Consultants:   Cardiology  Procedures:  2d echo  Antimicrobials:  Anti-infectives (From admission, onward)   Start     Dose/Rate Route Frequency Ordered Stop   07/28/19 1000  azithromycin (ZITHROMAX) tablet 250 mg  Status:  Discontinued     250 mg Oral Daily 07/27/19 1957 07/27/19 2012   07/27/19 2015  cefTRIAXone (ROCEPHIN) 1 g in sodium chloride 0.9 % 100 mL IVPB     1 g 200 mL/hr over 30 Minutes Intravenous Every 24 hours 07/27/19 2010     07/27/19 2015  azithromycin (ZITHROMAX)  500 mg in sodium chloride 0.9 % 250 mL IVPB     500 mg 250 mL/hr over 60 Minutes Intravenous Every 24 hours 07/27/19 2010     07/27/19 1200  cefTRIAXone (ROCEPHIN) 1 g in sodium chloride 0.9 % 100 mL IVPB  Status:  Discontinued     1 g 200 mL/hr over 30 Minutes Intravenous Every 24 hours 07/26/19 2017 07/27/19 1951   07/27/19 1200  azithromycin (ZITHROMAX) 500 mg in sodium chloride 0.9 % 250 mL IVPB  Status:  Discontinued     500 mg 250 mL/hr over 60 Minutes Intravenous Every 24 hours 07/26/19 2017 07/27/19 1951   07/26/19 1200  cefTRIAXone (ROCEPHIN) 1 g in sodium chloride 0.9 % 100 mL IVPB     1 g 200 mL/hr over 30 Minutes Intravenous  Once 07/26/19 1148 07/26/19 1310   07/26/19 1200  azithromycin (ZITHROMAX) 500 mg in sodium chloride 0.9 % 250 mL IVPB  500 mg 250 mL/hr over 60 Minutes Intravenous  Once 07/26/19 1148 07/26/19 1345     Subjective: Currently patient does not have chest pain, shortness breath, cough.  She has mild abdominal discomfort, no nausea vomiting or diarrhea.  No fever or chills.    Objective: Vitals:   07/27/19 1452 07/27/19 1457 07/27/19 1459 07/27/19 1500  BP:    130/73  Pulse: 71 86 (!) 101 87  Resp:    18  Temp:    99.2 F (37.3 C)  TempSrc:    Oral  SpO2: 92% 92% 93% 97%  Weight:      Height:        Intake/Output Summary (Last 24 hours) at 07/27/2019 2016 Last data filed at 07/27/2019 2000 Gross per 24 hour  Intake 963 ml  Output 2000 ml  Net -1037 ml   Filed Weights   07/26/19 0850 07/26/19 1639 07/27/19 0137  Weight: 83.9 kg 83.6 kg 82.1 kg    Examination: Physical Exam:  General: Not in acute distress HEENT: PERRL, EOMI, no scleral icterus, No JVD or bruit Cardiac: S1/S2, RRR, No murmurs, gallops or rubs Pulm: No rales, has mild wheezing, no rhonchi or rubs. Abd: Soft, nondistended, nontender, no rebound pain, no organomegaly, BS present Ext: No edema. 2+DP/PT pulse bilaterally Musculoskeletal: No joint deformities, erythema,  or stiffness, ROM full Skin: No rashes.  Neuro: Alert and oriented X3, cranial nerves II-XII grossly intact, muscle strength 5/5 in all extremeties. Psych: Patient is not psychotic, no suicidal or hemocidal ideation.    Data Reviewed: I have personally reviewed following labs and imaging studies  CBC: Recent Labs  Lab 07/26/19 0849 07/27/19 0741  WBC 12.7* 12.1*  HGB 13.7 13.4  HCT 41.3 38.2*  MCV 94.1 91.4  PLT 130* 161   Basic Metabolic Panel: Recent Labs  Lab 07/26/19 0849 07/27/19 0508  NA 134* 137  K 3.9 4.1  CL 103 101  CO2 19* 23  GLUCOSE 282* 134*  BUN 26* 29*  CREATININE 1.95* 2.09*  CALCIUM 9.2 9.4   GFR: Estimated Creatinine Clearance: 26.3 mL/min (A) (by C-G formula based on SCr of 2.09 mg/dL (H)). Liver Function Tests: No results for input(s): AST, ALT, ALKPHOS, BILITOT, PROT, ALBUMIN in the last 168 hours. No results for input(s): LIPASE, AMYLASE in the last 168 hours. No results for input(s): AMMONIA in the last 168 hours. Coagulation Profile: No results for input(s): INR, PROTIME in the last 168 hours. Cardiac Enzymes: No results for input(s): CKTOTAL, CKMB, CKMBINDEX, TROPONINI in the last 168 hours. BNP (last 3 results) Recent Labs    11/01/18 1612  PROBNP 191.0*   HbA1C: No results for input(s): HGBA1C in the last 72 hours. CBG: Recent Labs  Lab 07/26/19 1714 07/26/19 2115 07/27/19 0626 07/27/19 1156 07/27/19 1722  GLUCAP 213* 139* 125* 197* 147*   Lipid Profile: No results for input(s): CHOL, HDL, LDLCALC, TRIG, CHOLHDL, LDLDIRECT in the last 72 hours. Thyroid Function Tests: No results for input(s): TSH, T4TOTAL, FREET4, T3FREE, THYROIDAB in the last 72 hours. Anemia Panel: No results for input(s): VITAMINB12, FOLATE, FERRITIN, TIBC, IRON, RETICCTPCT in the last 72 hours. Sepsis Labs: No results for input(s): PROCALCITON, LATICACIDVEN in the last 168 hours.  Recent Results (from the past 240 hour(s))  SARS Coronavirus 2  [facility] order, Performed in [facility] hospital lab) Nasopharyngeal Nasopharyngeal Swab     Status: None   Collection Time: 07/26/19 10:47 AM   Specimen: Nasopharyngeal Swab  Result Value Ref Range Status  SARS Coronavirus 2 NEGATIVE NEGATIVE Final    Comment: (NOTE) If result is NEGATIVE SARS-CoV-2 target nucleic acids are NOT DETECTED. The SARS-CoV-2 RNA is generally detectable in upper and lower  respiratory specimens during the acute phase of infection. The lowest  concentration of SARS-CoV-2 viral copies this assay can detect is 250  copies / mL. A negative result does not preclude SARS-CoV-2 infection  and should not be used as the sole basis for treatment or other  patient management decisions.  A negative result may occur with  improper specimen collection / handling, submission of specimen other  than nasopharyngeal swab, presence of viral mutation(s) within the  areas targeted by this assay, and inadequate number of viral copies  (<250 copies / mL). A negative result must be combined with clinical  observations, patient history, and epidemiological information. If result is POSITIVE SARS-CoV-2 target nucleic acids are DETECTED. The SARS-CoV-2 RNA is generally detectable in upper and lower  respiratory specimens dur ing the acute phase of infection.  Positive  results are indicative of active infection with SARS-CoV-2.  Clinical  correlation with patient history and other diagnostic information is  necessary to determine patient infection status.  Positive results do  not rule out bacterial infection or co-infection with other viruses. If result is PRESUMPTIVE POSTIVE SARS-CoV-2 nucleic acids MAY BE PRESENT.   A presumptive positive result was obtained on the submitted specimen  and confirmed on repeat testing.  While 2019 novel coronavirus  (SARS-CoV-2) nucleic acids may be present in the submitted sample  additional confirmatory testing may be necessary for  epidemiological  and / or clinical management purposes  to differentiate between  SARS-CoV-2 and other Sarbecovirus currently known to infect humans.  If clinically indicated additional testing with an alternate test  methodology [phone]) is advised. The SARS-CoV-2 RNA is generally  detectable in upper and lower respiratory sp ecimens during the acute  phase of infection. The expected result is Negative. Fact Sheet for Patients:  StrictlyIdeas.no Fact Sheet for Healthcare Providers: BankingDealers.co.za This test is not yet approved or cleared by the [place] FDA and has been authorized for detection and/or diagnosis of SARS-CoV-2 by FDA under an Emergency Use Authorization (EUA).  This EUA will remain in effect (meaning this test can be used) for the duration of the COVID-19 declaration under Section 564(b)(1) of the Act, 21 U.S.C. section 360bbb-3(b)(1), unless the authorization is terminated or revoked sooner. Performed at [facility], [place] [address], [place], [place] [address]   Urine culture     Status: Abnormal   Collection Time: 07/26/19 11:11 AM   Specimen: Urine, Random  Result Value Ref Range Status   Specimen Description URINE, RANDOM  Final   Special Requests NONE  Final   Culture (A)  Final    <10,000 COLONIES/mL INSIGNIFICANT GROWTH Performed at [facility], [place] [address] [place],  [address]    Report Status 07/27/2019 FINAL  Final       Radiology Studies: Dg Chest 2 View  Result Date: 07/26/2019 CLINICAL DATA:  Shortness of breath for 1-2 years. The patient reports recent fevers. EXAM: CHEST - 2 VIEW COMPARISON:  PA and lateral chest 03/09/2016 and 11/01/2018. FINDINGS: There is cardiomegaly and mild interstitial edema. The patient is status post CABG. No consolidative process, pneumothorax or effusion. No acute or focal bony abnormality. IMPRESSION: Cardiomegaly and mild interstitial  edema. Atherosclerosis. Electronically Signed   By: Inge Rise M.D.   On: 07/26/2019 09:28  Scheduled Meds: . [START ON 07/28/2019] amLODipine  5 mg Oral Daily  . apixaban  2.5 mg Oral BID  . carvedilol  6.25 mg Oral BID WC  . furosemide  40 mg Oral BID  . insulin aspart  0-5 Units Subcutaneous QHS  . insulin aspart  0-9 Units Subcutaneous TID WC  . linagliptin  5 mg Oral Daily  . pantoprazole  40 mg Oral Daily  . rosuvastatin  20 mg Oral Daily  . sodium chloride flush  3 mL Intravenous Q12H  . spironolactone  25 mg Oral Daily   Continuous Infusions: . sodium chloride    . azithromycin    . cefTRIAXone (ROCEPHIN)  IV       LOS: 1 day    Time spent: 30 min    Ivor Costa, DO [facility] PAGER is on [place]  If 7PM-7AM, please contact night-coverage [email or web address] Password [email or web address] 07/27/2019, 8:16 PM

## 2019-07-27 NOTE — Progress Notes (Signed)
Patient was given tramadol for chronic Right shoulder pain. On RA no distress. Mid shift report given.

## 2019-07-27 NOTE — Discharge Instructions (Signed)

## 2019-07-27 NOTE — Progress Notes (Signed)
Bernie for apixaban Indication: atrial fibrillation  Allergies  Allergen Reactions  . Codeine   . Morphine     REACTION: does not like    Patient Measurements: Height: 5\' 9"  (175.3 cm) Weight: 181 lb 1.6 oz (82.1 kg) IBW/kg (Calculated) : 70.7   Vital Signs: Temp: 98.6 F (37 C) (10/09 0752) Temp Source: Oral (10/09 0752) BP: 139/78 (10/09 0752) Pulse Rate: 66 (10/09 0752)  Labs: Recent Labs    07/26/19 0849 07/26/19 1051 07/27/19 0508 07/27/19 0741  HGB 13.7  --   --  13.4  HCT 41.3  --   --  38.2*  PLT 130*  --   --  187  CREATININE 1.95*  --  2.09*  --   TROPONINIHS 84* 107* 143*  --     Estimated Creatinine Clearance: 26.3 mL/min (A) (by C-G formula based on SCr of 2.09 mg/dL (H)).   Medications:  Medications Prior to Admission  Medication Sig Dispense Refill Last Dose  . amLODipine (NORVASC) 10 MG tablet TAKE 1 TABLET BY MOUTH EVERY DAY (Patient taking differently: Take 10 mg by mouth daily. ) 90 tablet 1 07/26/2019 at Unknown time  . carvedilol (COREG) 6.25 MG tablet Take 1 tablet (6.25 mg total) by mouth 2 (two) times daily with a meal. 180 tablet 3 07/26/2019 at 0630  . clopidogrel (PLAVIX) 75 MG tablet Take 1 tablet (75 mg total) by mouth daily. TAKE 1 TABLET BY MOUTH EVERY DAY (Patient taking differently: Take 75 mg by mouth daily. ) 90 tablet 3 07/26/2019 at Unknown time  . docusate sodium (STOOL SOFTENER) 100 MG capsule Take 100 mg by mouth every evening.   07/25/2019 at Unknown time  . furosemide (LASIX) 40 MG tablet TAKE 1 TABLET BY MOUTH EVERY DAY (Patient taking differently: Take 40 mg by mouth daily. ) 90 tablet 2 07/26/2019 at Unknown time  . JANUVIA 50 MG tablet TAKE 1 TABLET BY MOUTH EVERY DAY (Patient taking differently: Take 50 mg by mouth daily. ) 90 tablet 0 07/26/2019 at Unknown time  . Multiple Vitamins-Minerals (ICAPS AREDS 2 PO) Take 1 capsule by mouth daily.   07/26/2019 at Unknown time  . nitroGLYCERIN  (NITROSTAT) 0.4 MG SL tablet Place 1 tablet (0.4 mg total) under the tongue every 5 (five) minutes x 3 doses as needed for chest pain (if no relief after 3rd dose, proceed to the ED for an evaluation). 75 tablet 1 unk  . pantoprazole (PROTONIX) 40 MG tablet TAKE 1 TABLET BY MOUTH EVERY DAY (Patient taking differently: Take 40 mg by mouth every evening. ) 90 tablet 1 07/25/2019 at Unknown time  . rosuvastatin (CRESTOR) 20 MG tablet TAKE 1 TABLET BY MOUTH EVERY DAY (Patient taking differently: Take 20 mg by mouth every evening. ) 90 tablet 1 07/25/2019 at Unknown time  . spironolactone (ALDACTONE) 25 MG tablet TAKE 1 TABLET BY MOUTH EVERY DAY (Patient taking differently: Take 25 mg by mouth daily. ) 90 tablet 0 07/26/2019 at Unknown time  . traMADol (ULTRAM) 50 MG tablet Take 1 tablet (50 mg total) by mouth every 6 (six) hours as needed. 1-2 every 6 h prn pain (Patient taking differently: Take 50-100 mg by mouth every 6 (six) hours as needed for moderate pain. ) 120 tablet 5 Past Month at Unknown time  . glucose blood (FREESTYLE LITE) test strip 1 each by Other route as needed. Use as instructed      . Lancets (FREESTYLE) lancets 1 each  by Other route as needed. Use as instructed       Scheduled:  . apixaban  2.5 mg Oral BID  . carvedilol  6.25 mg Oral BID WC  . insulin aspart  0-5 Units Subcutaneous QHS  . insulin aspart  0-9 Units Subcutaneous TID WC  . linagliptin  5 mg Oral Daily  . pantoprazole  40 mg Oral Daily  . rosuvastatin  20 mg Oral Daily  . sodium chloride flush  3 mL Intravenous Q12H  . spironolactone  25 mg Oral Daily    Assessment: 85 yo male with afib (CHADSVASC= 5). Pharmacy consulted to dose apixaban -hg= 13.4, SCr= 2.09 (range 1.6-2.0 this year)  Goal of Therapy:  Monitor platelets by anticoagulation protocol: Yes   Plan:  -d/c lovenox -apixaban 2.5mg  po bid -Will provide patient education  Hildred Laser, PharmD Clinical Pharmacist **Pharmacist phone directory can now  be found on [email or web address] (PW [email or web address]).  Listed under [place].

## 2019-07-28 DIAGNOSIS — R778 Other specified abnormalities of plasma proteins: Secondary | ICD-10-CM

## 2019-07-28 DIAGNOSIS — I480 Paroxysmal atrial fibrillation: Secondary | ICD-10-CM

## 2019-07-28 DIAGNOSIS — I159 Secondary hypertension, unspecified: Secondary | ICD-10-CM

## 2019-07-28 LAB — BASIC METABOLIC PANEL
Anion gap: 13 (ref 5–15)
BUN: 32 mg/dL — ABNORMAL HIGH (ref 8–23)
CO2: 23 mmol/L (ref 22–32)
Calcium: 9.6 mg/dL (ref 8.9–10.3)
Chloride: 99 mmol/L (ref 98–111)
Creatinine, Ser: 1.94 mg/dL — ABNORMAL HIGH (ref 0.61–1.24)
GFR calc Af Amer: 36 mL/min — ABNORMAL LOW (ref >60)
GFR calc non Af Amer: 31 mL/min — ABNORMAL LOW (ref >60)
Glucose, Bld: 250 mg/dL — ABNORMAL HIGH (ref 70–99)
Potassium: 3.9 mmol/L (ref 3.5–5.1)
Sodium: 135 mmol/L (ref 135–145)

## 2019-07-28 LAB — TSH: TSH: 6.832 u[IU]/mL — ABNORMAL HIGH (ref 0.350–4.500)

## 2019-07-28 LAB — CBC
HCT: 36 % — ABNORMAL LOW (ref 39.0–52.0)
Hemoglobin: 13 g/dL (ref 13.0–17.0)
MCH: 32.6 pg (ref 26.0–34.0)
MCHC: 36.1 g/dL — ABNORMAL HIGH (ref 30.0–36.0)
MCV: 90.2 fL (ref 80.0–100.0)
Platelets: 153 10*3/uL (ref 150–400)
RBC: 3.99 MIL/uL — ABNORMAL LOW (ref 4.22–5.81)
RDW: 12.2 % (ref 11.5–15.5)
WBC: 10.1 10*3/uL (ref 4.0–10.5)
nRBC: 0 % (ref 0.0–0.2)

## 2019-07-28 LAB — T4, FREE: Free T4: 1.21 ng/dL — ABNORMAL HIGH (ref 0.61–1.12)

## 2019-07-28 LAB — GLUCOSE, CAPILLARY
Glucose-Capillary: 219 mg/dL — ABNORMAL HIGH (ref 70–99)
Glucose-Capillary: 220 mg/dL — ABNORMAL HIGH (ref 70–99)

## 2019-07-28 MED ORDER — SITAGLIPTIN PHOSPHATE 50 MG PO TABS: 50.0000 mg | ORAL_TABLET | Freq: Every day | ORAL | 3 refills | Status: DC

## 2019-07-28 MED ORDER — ALBUTEROL SULFATE (2.5 MG/3ML) 0.083% IN NEBU: 2.5000 mg | INHALATION_SOLUTION | RESPIRATORY_TRACT | 3 refills | Status: DC | PRN

## 2019-07-28 MED ORDER — FUROSEMIDE 40 MG PO TABS: 40.0000 mg | ORAL_TABLET | Freq: Two times a day (BID) | ORAL | 1 refills | Status: DC

## 2019-07-28 MED ORDER — ONDANSETRON HCL 4 MG PO TABS: 4.0000 mg | ORAL_TABLET | Freq: Four times a day (QID) | ORAL | 0 refills | Status: DC | PRN

## 2019-07-28 MED ORDER — AMLODIPINE BESYLATE 5 MG PO TABS: 5.0000 mg | ORAL_TABLET | Freq: Every day | ORAL | 3 refills | Status: DC

## 2019-07-28 MED ORDER — AZITHROMYCIN 500 MG PO TABS
500.0000 mg | ORAL_TABLET | Freq: Every day | ORAL | Status: DC
  Administered 2019-07-28: 500 mg via ORAL
  Filled 2019-07-28: qty 1

## 2019-07-28 MED ORDER — APIXABAN 2.5 MG PO TABS: 2.5000 mg | ORAL_TABLET | Freq: Two times a day (BID) | ORAL | 3 refills | Status: DC

## 2019-07-28 MED ORDER — ACETAMINOPHEN 325 MG PO TABS: 650.0000 mg | ORAL_TABLET | Freq: Four times a day (QID) | ORAL | 1 refills | Status: DC | PRN

## 2019-07-28 MED ORDER — AZITHROMYCIN 500 MG PO TABS: 500.0000 mg | ORAL_TABLET | Freq: Every day | ORAL | 0 refills | Status: DC

## 2019-07-28 MED ORDER — DOCUSATE SODIUM 100 MG PO CAPS: 100.0000 mg | ORAL_CAPSULE | Freq: Two times a day (BID) | ORAL | 0 refills | Status: DC | PRN

## 2019-07-28 NOTE — Discharge Summary (Addendum)
Physician Discharge Summary  CLIVE PARCEL JKD:[id number] DOB: [birth year] DOA: 07/26/2019  PCP: [name], MD  Admit date: 07/26/2019 Discharge date: 07/28/2019    Recommendations for Outpatient Follow-up:  1. Follow up with PCP in 1-2 weeks 2. Please obtain BMP/CBC   3.  Please follow pending blood culture  Home Health: none Equipment/Devices: none   Discharge Condition: stable CODE STATUS: fall Diet recommendation: Low-salt diet  Brief/Interim Summary (HPI)  Acute on chronic diastolic CHF (congestive heart failure) ([place]): Patient's shortness breath is most likely due to acute on chronic diastolic CHF exacerbation.  2D echo on 04/15/2018 showed EF 55-60% with grade 1 diastolic dysfunction.  The repeated 2D echo showed EF 60-65 today.  Patient has an elevated BNP 441. CXR showed interstitial edema, consistent with CHF exacerbation.  Patient has a mild leukocytosis with WBC 12.7, but no fever.  He has mild wheezing on auscultation, indicating possible bronchitis change.  Does not seem to have pneumonia.  Patient was started on Rocephin and azithromycin, will discontinue both and change to oral azithromycin.  Follow-up of blood culture.  Clear urine antigen of strep and Legionella negative. Negative Covid19. Pt was started with IV lasix, which is switched to Lasix to 40 mg p.o. twice daily by card given that his creatinine starting to increase.   -continue lasix 40 mg po bid -Highly appreciate cardiology's consultation and recommendations. -albuterol inhaler prn -f/u Bx -continue oral azithromycin for total of 5 days  A fib:  -continue Eliquis 2.5 mg bid -coreg  Elevated hsTroponin and hx of  CAD s/p CABG/PCI:  No CP.  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 This admission 1.95 ->2.09-->1.94.    HTN: bp 128/59 - Home amlodipine -->decreased dose from 10 to 5 mg daily -Coreg  GERD: -Protonix  HLD: -Crestor.   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 (HCC)   HTN (hypertension)   CKD (chronic kidney disease) stage 4, GFR 15-29 ml/min (HCC)   PAF (paroxysmal atrial fibrillation) (HCC)   Congestive heart failure (CHF) (HCC)   Pure hypercholesterolemia   Elevated troponin   HLD (hyperlipidemia)   GERD (gastroesophageal reflux disease)    Discharge Instructions  Discharge Instructions    Call MD for:  difficulty breathing, headache or visual disturbances   Complete by: As directed    Call MD for:  extreme fatigue   Complete by: As directed    Call MD for:  persistant dizziness or light-headedness   Complete by: As directed    Call MD for:  severe uncontrolled pain   Complete by: As directed    Call MD for:  temperature >100.4   Complete by: As directed    Diet - low sodium heart healthy   Complete by: As directed    Discharge instructions   Complete by: As directed    You were cared for by a hospitalist during your hospital stay. If you have any questions about your discharge medications or the care you received while you were in the hospital after you are discharged, you can call the unit and ask to speak with the hospitalist on call if the hospitalist that took care of you is not  available. Once you are discharged, your primary care physician will handle any further medical issues. Please note that NO REFILLS for any discharge medications will be authorized once you are discharged, as it is imperative that you return to your primary care physician (or establish a relationship with a primary care physician if you do not have one) for your aftercare needs so that they can reassess your need for  medications and monitor your lab values.  Follow up with PCP and your cardiologist, Dr. Percival Spanish with 2 weeks, take all medications as prescribed. If symptoms change or worsen please return to the ED for evaluation   Increase activity slowly   Complete by: As directed      Allergies as of 07/28/2019      Reactions   Codeine    Morphine    REACTION: does not like      Medication List    TAKE these medications   acetaminophen 325 MG tablet Commonly known as: TYLENOL Take 2 tablets (650 mg total) by mouth every 6 (six) hours as needed for mild pain, fever or headache.   albuterol (2.5 MG/3ML) 0.083% nebulizer solution Commonly known as: PROVENTIL Take 3 mLs (2.5 mg total) by nebulization every 4 (four) hours as needed for wheezing or shortness of breath.   amLODipine 5 MG tablet Commonly known as: NORVASC Take 1 tablet (5 mg total) by mouth daily. Start taking on: July 29, 2019 What changed:   medication strength  how much to take   apixaban 2.5 MG Tabs tablet Commonly known as: ELIQUIS Take 1 tablet (2.5 mg total) by mouth 2 (two) times daily.   azithromycin 500 MG tablet Commonly known as: ZITHROMAX Take 1 tablet (500 mg total) by mouth daily. Start taking on: July 29, 2019   carvedilol 6.25 MG tablet Commonly known as: COREG Take 1 tablet (6.25 mg total) by mouth 2 (two) times daily with a meal.   clopidogrel 75 MG tablet Commonly known as: PLAVIX Take 1 tablet (75 mg total) by mouth daily. TAKE 1 TABLET BY MOUTH EVERY DAY What changed: additional instructions   docusate sodium 100 MG capsule Commonly known as: Stool Softener Take 1 capsule (100 mg total) by mouth 2 (two) times daily as needed for mild constipation. What changed:   when to take this  reasons to take this   freestyle lancets 1 each by Other route as needed. Use as instructed   FREESTYLE LITE test strip Generic drug: glucose blood 1 each by Other route as needed. Use as  instructed   furosemide 40 MG tablet Commonly known as: LASIX Take 1 tablet (40 mg total) by mouth 2 (two) times daily. What changed: when to take this   ICAPS AREDS 2 PO Take 1 capsule by mouth daily.   nitroGLYCERIN 0.4 MG SL tablet Commonly known as: NITROSTAT Place 1 tablet (0.4 mg total) under the tongue every 5 (five) minutes x 3 doses as needed for chest pain (if no relief after 3rd dose, proceed to the ED for an evaluation).   ondansetron 4 MG tablet Commonly known as: ZOFRAN Take 1 tablet (4 mg total) by mouth every 6 (six) hours as needed for nausea.   pantoprazole 40 MG tablet Commonly known as: PROTONIX TAKE 1 TABLET BY MOUTH EVERY DAY   rosuvastatin 20 MG tablet Commonly known as: CRESTOR TAKE 1 TABLET BY MOUTH EVERY DAY What changed: when to take this   sitaGLIPtin 50 MG tablet Commonly known as: Januvia Take  1 tablet (50 mg total) by mouth daily.   spironolactone 25 MG tablet Commonly known as: ALDACTONE TAKE 1 TABLET BY MOUTH EVERY DAY   traMADol 50 MG tablet Commonly known as: ULTRAM Take 1 tablet (50 mg total) by mouth every 6 (six) hours as needed. 1-2 every 6 h prn pain What changed:   how much to take  reasons to take this  additional instructions      Follow-up Information    [name], MD Follow up in 2 week(s).   Specialty: Cardiology Contact information: [address] [place] [address] [phone]          Allergies  Allergen Reactions  . Codeine   . Morphine     REACTION: does not like    Consultations: -Cardiology   Procedures/Studies: Dg Chest 2 View  Result Date: 07/26/2019 CLINICAL DATA:  Shortness of breath for 1-2 years. The patient reports recent fevers. EXAM: CHEST - 2 VIEW COMPARISON:  PA and lateral chest 03/09/2016 and 11/01/2018. FINDINGS: There is cardiomegaly and mild interstitial edema. The patient is status post CABG. No consolidative process, pneumothorax or effusion. No  acute or focal bony abnormality. IMPRESSION: Cardiomegaly and mild interstitial edema. Atherosclerosis. Electronically Signed   By: Inge Rise M.D.   On: 07/26/2019 09:28     Subjective:   Discharge Exam: Vitals:   07/28/19 0734 07/28/19 1252  BP: (!) 153/74 (!) 144/67  Pulse: 62 62  Resp: 18 18  Temp: (!) 97.5 F (36.4 C) (!) 97.5 F (36.4 C)  SpO2: 95% 95%   Vitals:   07/28/19 0143 07/28/19 0509 07/28/19 0734 07/28/19 1252  BP: (!) 152/83 (!) 155/83 (!) 153/74 (!) 144/67  Pulse: 66 78 62 62  Resp: 18 18 18 18   Temp: 98 F (36.7 C) 97.7 F (36.5 C) (!) 97.5 F (36.4 C) (!) 97.5 F (36.4 C)  TempSrc: Oral Oral Oral Oral  SpO2: 94% 95% 95% 95%  Weight: 81.6 kg     Height:        General: Pt is alert, awake, not in acute distress Cardiovascular: RRR, S1/S2 +, no rubs, no gallops Respiratory: CTA bilaterally, no wheezing, no rhonchi Abdominal: Soft, NT, ND, bowel sounds + Extremities: no edema, no cyanosis    The results of significant diagnostics from this hospitalization (including imaging, microbiology, ancillary and laboratory) are listed below for reference.     Microbiology: Recent Results (from the past 240 hour(s))  SARS Coronavirus 2 [facility] order, Performed in [facility] hospital lab) Nasopharyngeal Nasopharyngeal Swab     Status: None   Collection Time: 07/26/19 10:47 AM   Specimen: Nasopharyngeal Swab  Result Value Ref Range Status   SARS Coronavirus 2 NEGATIVE NEGATIVE Final    Comment: (NOTE) If result is NEGATIVE SARS-CoV-2 target nucleic acids are NOT DETECTED. The SARS-CoV-2 RNA is generally detectable in upper and lower  respiratory specimens during the acute phase of infection. The lowest  concentration of SARS-CoV-2 viral copies this assay can detect is 250  copies / mL. A negative result does not preclude SARS-CoV-2 infection  and should not be used as the sole basis for treatment or other  patient management decisions.  A  negative result may occur with  improper specimen collection / handling, submission of specimen other  than nasopharyngeal swab, presence of viral mutation(s) within the  areas targeted by this assay, and inadequate number of viral copies  (<250 copies / mL). A negative result must be  combined with clinical  observations, patient history, and epidemiological information. If result is POSITIVE SARS-CoV-2 target nucleic acids are DETECTED. The SARS-CoV-2 RNA is generally detectable in upper and lower  respiratory specimens dur ing the acute phase of infection.  Positive  results are indicative of active infection with SARS-CoV-2.  Clinical  correlation with patient history and other diagnostic information is  necessary to determine patient infection status.  Positive results do  not rule out bacterial infection or co-infection with other viruses. If result is PRESUMPTIVE POSTIVE SARS-CoV-2 nucleic acids MAY BE PRESENT.   A presumptive positive result was obtained on the submitted specimen  and confirmed on repeat testing.  While 2019 novel coronavirus  (SARS-CoV-2) nucleic acids may be present in the submitted sample  additional confirmatory testing may be necessary for epidemiological  and / or clinical management purposes  to differentiate between  SARS-CoV-2 and other Sarbecovirus currently known to infect humans.  If clinically indicated additional testing with an alternate test  methodology [phone]) is advised. The SARS-CoV-2 RNA is generally  detectable in upper and lower respiratory sp ecimens during the acute  phase of infection. The expected result is Negative. Fact Sheet for Patients:  StrictlyIdeas.no Fact Sheet for Healthcare Providers: BankingDealers.co.za This test is not yet approved or cleared by the [place] FDA and has been authorized for detection and/or diagnosis of SARS-CoV-2 by FDA under an Emergency Use  Authorization (EUA).  This EUA will remain in effect (meaning this test can be used) for the duration of the COVID-19 declaration under Section 564(b)(1) of the Act, 21 U.S.C. section 360bbb-3(b)(1), unless the authorization is terminated or revoked sooner. Performed at [facility], [place] [address] [place], [place] [address]   Urine culture     Status: Abnormal   Collection Time: 07/26/19 11:11 AM   Specimen: Urine, Random  Result Value Ref Range Status   Specimen Description URINE, RANDOM  Final   Special Requests NONE  Final   Culture (A)  Final    <10,000 COLONIES/mL INSIGNIFICANT GROWTH Performed at [facility], [place] [address] [place], [place] [address]    Report Status 07/27/2019 FINAL  Final     Labs: BNP (last 3 results) Recent Labs    07/26/19 0849  BNP 938.1*   Basic Metabolic Panel: Recent Labs  Lab 07/26/19 0849 07/27/19 0508 07/28/19 0405  NA 134* 137 135  K 3.9 4.1 3.9  CL 103 101 99  CO2 19* 23 23  GLUCOSE 282* 134* 250*  BUN 26* 29* 32*  CREATININE 1.95* 2.09* 1.94*  CALCIUM 9.2 9.4 9.6   Liver Function Tests: No results for input(s): AST, ALT, ALKPHOS, BILITOT, PROT, ALBUMIN in the last 168 hours. No results for input(s): LIPASE, AMYLASE in the last 168 hours. No results for input(s): AMMONIA in the last 168 hours. CBC: Recent Labs  Lab 07/26/19 0849 07/27/19 0741 07/28/19 0405  WBC 12.7* 12.1* 10.1  HGB 13.7 13.4 13.0  HCT 41.3 38.2* 36.0*  MCV 94.1 91.4 90.2  PLT 130* 187 153   Cardiac Enzymes: No results for input(s): CKTOTAL, CKMB, CKMBINDEX, TROPONINI in the last 168 hours. BNP: Invalid input(s): POCBNP CBG: Recent Labs  Lab 07/27/19 1156 07/27/19 1722 07/27/19 2138 07/28/19 0623 07/28/19 1102  GLUCAP 197* 147* 167* 219* 220*   D-Dimer No results for input(s): DDIMER in the last 72 hours. Hgb A1c No results for input(s): HGBA1C in the last 72 hours. Lipid Profile No results for input(s): CHOL,  HDL,  LDLCALC, TRIG, CHOLHDL, LDLDIRECT in the last 72 hours. Thyroid function studies Recent Labs    07/28/19 0405  TSH 6.832*   Anemia work up No results for input(s): VITAMINB12, FOLATE, FERRITIN, TIBC, IRON, RETICCTPCT in the last 72 hours. Urinalysis    Component Value Date/Time   COLORURINE YELLOW 07/26/2019 1111   APPEARANCEUR CLEAR 07/26/2019 1111   LABSPEC 1.009 07/26/2019 1111   PHURINE 6.0 07/26/2019 1111   GLUCOSEU NEGATIVE 07/26/2019 1111   HGBUR NEGATIVE 07/26/2019 1111   HGBUR trace-intact 11/23/2007 0000   BILIRUBINUR NEGATIVE 07/26/2019 1111   KETONESUR NEGATIVE 07/26/2019 1111   PROTEINUR 30 (A) 07/26/2019 1111   UROBILINOGEN 0.2 02/16/2011 2229   NITRITE NEGATIVE 07/26/2019 1111   LEUKOCYTESUR NEGATIVE 07/26/2019 1111   Sepsis Labs Invalid input(s): PROCALCITONIN,  WBC,  LACTICIDVEN Microbiology Recent Results (from the past 240 hour(s))  SARS Coronavirus 2 [facility] order, Performed in [facility] hospital lab) Nasopharyngeal Nasopharyngeal Swab     Status: None   Collection Time: 07/26/19 10:47 AM   Specimen: Nasopharyngeal Swab  Result Value Ref Range Status   SARS Coronavirus 2 NEGATIVE NEGATIVE Final    Comment: (NOTE) If result is NEGATIVE SARS-CoV-2 target nucleic acids are NOT DETECTED. The SARS-CoV-2 RNA is generally detectable in upper and lower  respiratory specimens during the acute phase of infection. The lowest  concentration of SARS-CoV-2 viral copies this assay can detect is 250  copies / mL. A negative result does not preclude SARS-CoV-2 infection  and should not be used as the sole basis for treatment or other  patient management decisions.  A negative result may occur with  improper specimen collection / handling, submission of specimen other  than nasopharyngeal swab, presence of viral mutation(s) within the  areas targeted by this assay, and inadequate number of viral copies  (<250 copies / mL). A negative result must be combined with  clinical  observations, patient history, and epidemiological information. If result is POSITIVE SARS-CoV-2 target nucleic acids are DETECTED. The SARS-CoV-2 RNA is generally detectable in upper and lower  respiratory specimens dur ing the acute phase of infection.  Positive  results are indicative of active infection with SARS-CoV-2.  Clinical  correlation with patient history and other diagnostic information is  necessary to determine patient infection status.  Positive results do  not rule out bacterial infection or co-infection with other viruses. If result is PRESUMPTIVE POSTIVE SARS-CoV-2 nucleic acids MAY BE PRESENT.   A presumptive positive result was obtained on the submitted specimen  and confirmed on repeat testing.  While 2019 novel coronavirus  (SARS-CoV-2) nucleic acids may be present in the submitted sample  additional confirmatory testing may be necessary for epidemiological  and / or clinical management purposes  to differentiate between  SARS-CoV-2 and other Sarbecovirus currently known to infect humans.  If clinically indicated additional testing with an alternate test  methodology [phone]) is advised. The SARS-CoV-2 RNA is generally  detectable in upper and lower respiratory sp ecimens during the acute  phase of infection. The expected result is Negative. Fact Sheet for Patients:  StrictlyIdeas.no Fact Sheet for Healthcare Providers: BankingDealers.co.za This test is not yet approved or cleared by the [place] FDA and has been authorized for detection and/or diagnosis of SARS-CoV-2 by FDA under an Emergency Use Authorization (EUA).  This EUA will remain in effect (meaning this test can be used) for the duration of the COVID-19 declaration under Section 564(b)(1) of the Act, 21 U.S.C. section 360bbb-3(b)(1), unless  the authorization is terminated or revoked sooner. Performed at [facility], [place]  [address] [place], [place] [address]   Urine culture     Status: Abnormal   Collection Time: 07/26/19 11:11 AM   Specimen: Urine, Random  Result Value Ref Range Status   Specimen Description URINE, RANDOM  Final   Special Requests NONE  Final   Culture (A)  Final    <10,000 COLONIES/mL INSIGNIFICANT GROWTH Performed at [facility], [place] [address], [place], [place] [address]    Report Status 07/27/2019 FINAL  Final    Time coordinating discharge: 35 minutes  SIGNED:  Ivor Costa, DO [facility] 07/28/2019, 1:01 PM Pager is on [place]  If 7PM-7AM, please contact night-coverage [email or web address] Password [email or web address]

## 2019-07-28 NOTE — Progress Notes (Signed)
Progress Note  Patient Name: [name] Date of Encounter: 07/28/2019  Primary Cardiologist: [name], MD  Subjective  Reports dyspnea have significantly improved.  However has been having nausea.  Net negative 300cc yesterday (though was unmeasured UOP) on IV lasix 40 mg x1 and PO lasix 40 mg x1.  Renal function improved (2.09 ->1.94).  BP elevated to 150s this morning.  TTE showed normal EF, grade 2 diastolic dysfunction, normal RAP.    Inpatient Medications    Scheduled Meds: . amLODipine  5 mg Oral Daily  . apixaban  2.5 mg Oral BID  . carvedilol  6.25 mg Oral BID WC  . furosemide  40 mg Oral BID  . insulin aspart  0-5 Units Subcutaneous QHS  . insulin aspart  0-9 Units Subcutaneous TID WC  . linagliptin  5 mg Oral Daily  . pantoprazole  40 mg Oral Daily  . rosuvastatin  20 mg Oral Daily  . sodium chloride flush  3 mL Intravenous Q12H  . spironolactone  25 mg Oral Daily   Continuous Infusions: . sodium chloride    . azithromycin    . cefTRIAXone (ROCEPHIN)  IV     PRN Meds: sodium chloride, acetaminophen, albuterol, nitroGLYCERIN, ondansetron **OR** ondansetron (ZOFRAN) IV, sodium chloride flush, traMADol   Vital Signs    Vitals:   07/27/19 2028 07/28/19 0143 07/28/19 0509 07/28/19 0734  BP: 135/68 (!) 152/83 (!) 155/83 (!) 153/74  Pulse: 81 66 78 62  Resp: 18 18 18 18   Temp: 99 F (37.2 C) 98 F (36.7 C) 97.7 F (36.5 C) (!) 97.5 F (36.4 C)  TempSrc: Oral Oral Oral Oral  SpO2: 95% 94% 95% 95%  Weight:  81.6 kg    Height:        Intake/Output Summary (Last 24 hours) at 07/28/2019 0921 Last data filed at 07/28/2019 4270 Gross per 24 hour  Intake 1203 ml  Output 1500 ml  Net -297 ml   Last 3 Weights 07/28/2019 07/27/2019 07/26/2019  Weight (lbs) 179 lb 14.4 oz 181 lb 1.6 oz 184 lb 3.2 oz  Weight (kg) 81.602 kg 82.146 kg 83.553 kg     Telemetry    Appears to be Afib with intermittent PVCs - Personally Reviewed  ECG    Read as NSR by ED  but appears to be AF with LBBB - repeat pending - Personally Reviewed  Physical Exam   GEN: No acute distress.  HEENT: Normocephalic, atraumatic, sclera non-icteric. Neck: No JVD or bruits. Cardiac: RRR, very soft SEM, no rubs or gallops.  Respiratory:ctab. Breathing is unlabored. GI: Soft, nontender, non-distended, BS +x 4. MS: no deformity. Extremities: No clubbing or cyanosis. No edema.  Neuro:  AAOx3.  Psych:  Responds to questions appropriately with a normal affect.  Labs    High Sensitivity Troponin:   Recent Labs  Lab 07/26/19 0849 07/26/19 1051 07/27/19 0508  TROPONINIHS 84* 107* 143*      Cardiac EnzymesNo results for input(s): TROPONINI in the last 168 hours. No results for input(s): TROPIPOC in the last 168 hours.   Chemistry Recent Labs  Lab 07/26/19 0849 07/27/19 0508 07/28/19 0405  NA 134* 137 135  K 3.9 4.1 3.9  CL 103 101 99  CO2 19* 23 23  GLUCOSE 282* 134* 250*  BUN 26* 29* 32*  CREATININE 1.95* 2.09* 1.94*  CALCIUM 9.2 9.4 9.6  GFRNONAA 31* 28* 31*  GFRAA 36* 33* 36*  ANIONGAP 12 13 13  Hematology Recent Labs  Lab 07/26/19 0849 07/27/19 0741 07/28/19 0405  WBC 12.7* 12.1* 10.1  RBC 4.39 4.18* 3.99*  HGB 13.7 13.4 13.0  HCT 41.3 38.2* 36.0*  MCV 94.1 91.4 90.2  MCH 31.2 32.1 32.6  MCHC 33.2 35.1 36.1*  RDW 12.6 12.6 12.2  PLT 130* 187 153    BNP Recent Labs  Lab 07/26/19 0849  BNP 441.0*     DDimer No results for input(s): DDIMER in the last 168 hours.   Radiology    No results found.  Cardiac Studies   2D echo 03/2018 ------------------------------------------------------------------- Study Conclusions  - Left ventricle: The cavity size was normal. Wall thickness was   increased in a pattern of moderate LVH. Systolic function was   normal. The estimated ejection fraction was in the range of 55%   to 60%. Wall motion was normal; there were no regional wall   motion abnormalities. Doppler parameters are  consistent with   abnormal left ventricular relaxation (grade 1 diastolic   dysfunction). - Aortic valve: Valve mobility was restricted. There was mild   stenosis. Peak velocity (S): 214 cm/s. Mean gradient (S): 10 mm   Hg. - Left atrium: The atrium was mildly dilated.  Patient Profile     85 y.o. male with h/o CAD s/p CABG 2009 and PCI (last in 2016), hypertension, hyperlipidemia, diabetes, chronic combined systolic and diastolic heart failure (EF 30% in 2009, improved to 55-60%), mild AS, PAF (at time of cath in 2016 without recurrence as OP), prostate cancer, LBBB, prostate CA, CKD stage III-IV, carotid artery disease (1-27% RICA, 51-70% LICA) admitted with exertional dyspnea, weight gain, fever, and non-productive cough. He was found to have acute on chronic systolic and diastolic heart failure, pneumonia, and mildly elevated troponin.  Assessment & Plan    Acute on chronic diastolic CHF - normal EF. Net negative 300cc yesterday (though was unmeasured UOP) on IV lasix 40 mg x1 and PO lasix 40 mg x1.  Renal function improved (2.09 ->1.94).  Appears euvolemic, normal right atrial pressure on TTE - Continue PO lasix 40 mg BID  Acute on chronic dyspnea likely multifactorial in setting of fever/cough with possible CAP and also CHF - tested negative for Covid-19. He is on antibiotics per IM  Paroxysmal atrial fib  - initially diagnosed during cath in 2016, no recurrence until now.  Switch from plavix to Eliquis 2.5 mg BID (per pharmacy will do 2.5 mg twice daily dose given age and renal function).  Well rate controlled.   Mild aortic stenosis by echo 03/2018 - repeat echo yesterday shows AS remains mild (MG 16)  CKD stage III-IV - baseline Cr appears 1.6-2 in recent years. This admission 1.95 -> 2.09->1.94. Check daily BMET.  Elevated hsTroponin in context of above, + hx of CAD s/p CABG/PCI - suspected demand ischemia. Maintained on chronic Plavix, BB, statin. No plans for ischemic eval  HTN  - BP elevated at times. Home amlodipine held by primary team. Can consider resuming  [place] will sign off.   Medication Recommendations:  Amlodipine 10 mg daily, coreg 6.25 mg BID, Eliquis 2.5 mg BID, lasix 40 mg BID, crestor 20 mg daily, spironolactone 25 mg daily.  Stopped plavix since starting Eliquis Other recommendations (labs, testing, etc):  None Follow up as an outpatient:  Will schedule follow-up   For questions or updates, please contact [place] Please consult [email or web address] for contact info under Cardiology/STEMI.  Signed, Donato Heinz, MD 07/28/2019, 9:21 AM

## 2019-07-30 ENCOUNTER — Telehealth: Payer: Self-pay | Admitting: *Deleted

## 2019-07-30 NOTE — Telephone Encounter (Signed)
Transition Care Management Follow-up Telephone Call   Date discharged? 10.10.2020   How have you been since you were released from the hospital? Doing ok. Slow to getting his energy back    Do you understand why you were in the hospital? yes   Do you understand the discharge instructions? no   Where were you discharged to?  Home    Items Reviewed:  Medications reviewed: yes  Allergies reviewed: yes  Dietary changes reviewed: yes (Low Salt)  Referrals reviewed: N/A    Functional Questionnaire:   Activities of Daily Living (ADLs):   He states they are independent in the following: ambulation, bathing and hygiene, feeding, continence, grooming, toileting and dressing States they require assistance with the following: N/A    Any transportation issues/concerns?: no   Any patient concerns? no   Confirmed importance and date/time of follow-up visits scheduled yes   Provider Appointment booked with: Dr. Elease Hashimoto Monday 08/06/2019 at 11:00AM   Confirmed with patient if condition begins to worsen call PCP or go to the ER.  Patient was given the office number and encouraged to call back with question or concerns.  : yes

## 2019-08-02 LAB — CULTURE, BLOOD (ROUTINE X 2)
Culture: NO GROWTH
Culture: NO GROWTH
Special Requests: ADEQUATE
Special Requests: ADEQUATE

## 2019-08-06 ENCOUNTER — Other Ambulatory Visit: Payer: Self-pay

## 2019-08-06 ENCOUNTER — Encounter: Payer: Self-pay | Admitting: Family Medicine

## 2019-08-06 ENCOUNTER — Ambulatory Visit: Payer: Medicare Other | Admitting: Family Medicine

## 2019-08-06 VITALS — BP 126/74 | HR 77 | Temp 98.0°F | Resp 16 | Ht 69.0 in | Wt 182.5 lb

## 2019-08-06 DIAGNOSIS — T501X5D Adverse effect of loop [high-ceiling] diuretics, subsequent encounter: Secondary | ICD-10-CM

## 2019-08-06 DIAGNOSIS — I5033 Acute on chronic diastolic (congestive) heart failure: Secondary | ICD-10-CM | POA: Diagnosis not present

## 2019-08-06 DIAGNOSIS — R062 Wheezing: Secondary | ICD-10-CM

## 2019-08-06 LAB — BASIC METABOLIC PANEL
BUN: 23 mg/dL (ref 6–23)
CO2: 27 mEq/L (ref 19–32)
Calcium: 10 mg/dL (ref 8.4–10.5)
Chloride: 100 mEq/L (ref 96–112)
Creatinine, Ser: 2.04 mg/dL — ABNORMAL HIGH (ref 0.40–1.50)
GFR: 31.24 mL/min — ABNORMAL LOW (ref >60.00)
Glucose, Bld: 144 mg/dL — ABNORMAL HIGH (ref 70–99)
Potassium: 4.2 mEq/L (ref 3.5–5.1)
Sodium: 138 mEq/L (ref 135–145)

## 2019-08-06 NOTE — Patient Instructions (Signed)
Weigh daily and be in touch for weight gain of 3 pounds in one day or 5 pounds in one week.

## 2019-08-06 NOTE — Progress Notes (Signed)
Subjective:     Patient ID: [name], male   DOB: Aug 15, 1933, 85 y.o.   MRN: [id number]  HPI Mr. Mceuen is seen for hospital follow-up.  He has multiple chronic problems including history of ischemic cardiomyopathy, diastolic heart failure, CAD, atrial fibrillation, type 2 diabetes, GERD, chronic kidney disease, history of prostate cancer, dyslipidemia.  He had presented to the ED with concern for low-grade fever and some dyspnea.  His concern was whether he had Covid infection.  COVID-19 testing was negative.  Chest x-ray did not show any pneumonia.  He did have slightly elevated white count and was started on broad-spectrum antibiotics.  Blood cultures came back negative.  Repeat echo showed ejection fraction 60 to 65%.  BNP level 441.  He had some interstitial edema on chest x-ray.  He had mild wheezing on exam but has no history of COPD or asthma.  Strep and Legionella urinary antigens were negative.  He was discharged on increased dose of Lasix at 40 mg twice daily.  Have been taking once daily previously.  Apparently, patient had been consuming daily Gatorade at home which may have contributed to his exacerbation.  He states that his weight had gone up about 8 pounds  He was continued on low-dose Eliquis and Coreg.  He had mild elevated troponin felt to be likely demand ischemia  Type 2 diabetes with recent A1c 7.0%.  His creatinine was near baseline.  Amlodipine was reduced from 10 mg to 5 mg He feels that he is back to baseline pretty much at this time.  Home weights have been stable.  He was prescribed albuterol but does not have home nebulizer and never got this filled he denies any cough or wheezing at this time  Past Medical History:  Diagnosis Date  . Arthritis   . CAD (coronary artery disease)    a. Cath September 2015 LIMA to the LAD patent, SVG to PDA patent, SVG to posterior lateral patent, SVG to OM with a 90% in-stent restenosis at an anastomotic lesion. This was treated with  angioplasty. b. cath 04/01/2015 95% ISR in SVG to OM treated with 2.75x24 Synergy DES postdilated to 3.72mm, all other grafts patent  . Cataract    surgery,B/L  . CHF (congestive heart failure) ([place])   . Chronic kidney disease    nephrolithiasis  . Diabetes mellitus    TYPE 2  . GERD (gastroesophageal reflux disease)   . Heart murmur   . Hernia   . Hypercholesterolemia   . Hypertension   . Incontinence    hx  over 1 year,leaks without awareness  . Other and unspecified diseases of appendix   . PAF (paroxysmal atrial fibrillation) ([place])    in the setting of ischemia on 03/31/2015  . Pancreatitis   . Prostate CA ([place]) 03/01/04   prostate bx=Adenocarcinoam,gleason 3+4=7,PSA=6.75  . Shortness of breath dyspnea   . Ulcer    hx gastric   Past Surgical History:  Procedure Laterality Date  . APPENDECTOMY    . BACK SURGERY    . CARDIAC CATHETERIZATION N/A 04/01/2015   Procedure: Left Heart Cath and Cors/Grafts Angiography;  Surgeon: Jettie Booze, MD;  Location: [facility];  Service: Cardiovascular;  Laterality: N/A;  . CARDIAC CATHETERIZATION N/A 04/01/2015   Procedure: Coronary Stent Intervention;  Surgeon: Jettie Booze, MD;  Location: [facility];  Service: Cardiovascular;  Laterality: N/A;  . CARPAL TUNNEL RELEASE  2004   both hands  . CORONARY  ARTERY BYPASS GRAFT    . CYSTOSCOPY  08/23/12   incomplete emoptying bladder  . HERNIA REPAIR    . incision and drainage of right chest abscess    . LAPAROSCOPIC CHOLECYSTECTOMY  09/2009  . LEFT HEART CATHETERIZATION WITH CORONARY ANGIOGRAM N/A 07/19/2014   Procedure: LEFT HEART CATHETERIZATION WITH CORONARY ANGIOGRAM;  Surgeon: Leonie Man, MD;  [facility];  Service: Cardiovascular;  Laterality: N/A;  . RECTAL SURGERY    . ROBOT ASSISTED LAPAROSCOPIC RADICAL PROSTATECTOMY  2005    reports that he quit smoking about 47 years ago. His smoking use included cigarettes. He has a 2.50 pack-year smoking  history. He has never used smokeless tobacco. He reports that he does not drink alcohol or use drugs. family history includes Cancer in his mother. Allergies  Allergen Reactions  . Codeine   . Morphine     REACTION: does not like     Review of Systems  Constitutional: Negative for chills, fatigue and fever.  Eyes: Negative for visual disturbance.  Respiratory: Negative for cough, chest tightness, shortness of breath and wheezing.   Cardiovascular: Negative for chest pain, palpitations and leg swelling.  Gastrointestinal: Negative for abdominal pain.  Neurological: Negative for dizziness, syncope, weakness, light-headedness and headaches.       Objective:   Physical Exam Constitutional:      Appearance: Normal appearance.  Neck:     Musculoskeletal: Neck supple.  Cardiovascular:     Rate and Rhythm: Normal rate.  Pulmonary:     Effort: Pulmonary effort is normal.     Breath sounds: Normal breath sounds. No wheezing.  Musculoskeletal:     Right lower leg: No edema.     Left lower leg: No edema.  Neurological:     General: No focal deficit present.     Mental Status: He is alert and oriented to person, place, and time.        Assessment:     #1 recent acute on chronic diastolic heart failure.  Echo as above with ejection fraction 55 to 60%.  Patient back to baseline regarding his weight and discharged on increased dose of Lasix as above  #2 hypertension stable  #3 chronic A. Fib-rate stable on carvedilol and patient on low-dose Eliquis  #4 type 2 diabetes with associated diabetic nephropathy stable on low-dose Januvia  #5 chronic kidney disease stage IV  #6 recent low-grade fever resolved.  No definitive evidence for pneumonia.    Plan:     -Repeat basic metabolic panel -Continue home daily weights and be in touch for weight gain more than 5 pounds in 1 week or 3 pounds in 1 day -We discussed whether to get home nebulizer.  At this point he has no wheezing  whatsoever and he wishes to hold at this point -Prescription given for new home glucose monitor -Follow-up immediately for any recurrent shortness of breath or other concerns.  Otherwise, he has follow-up in December scheduled  [name] MD [facility]

## 2019-08-08 ENCOUNTER — Telehealth: Payer: Self-pay | Admitting: *Deleted

## 2019-08-08 NOTE — Telephone Encounter (Signed)
[name] returned call for husband's lab results. Lab message given to her with verbal understanding. She scheduled the follow up appointment as requested but there is also another appointment Dec 28th. She would like a a call back if he needs to keep both appointments or to cancel one.  She would also like his lab results mailed to him.

## 2019-08-08 NOTE — Telephone Encounter (Signed)
Spoke with wife and made her aware of Dr. Erick Blinks message. Canceled the Nov appt. Nothing further is needed

## 2019-08-08 NOTE — Telephone Encounter (Signed)
Let's just keep December appt and can recheck then.

## 2019-08-11 ENCOUNTER — Other Ambulatory Visit: Payer: Self-pay | Admitting: Cardiology

## 2019-08-29 DIAGNOSIS — I4819 Other persistent atrial fibrillation: Secondary | ICD-10-CM | POA: Insufficient documentation

## 2019-08-29 DIAGNOSIS — I350 Nonrheumatic aortic (valve) stenosis: Secondary | ICD-10-CM | POA: Insufficient documentation

## 2019-08-29 NOTE — Progress Notes (Deleted)
HPI The patient presents for followup of CAD.   He had chest pain and had a cardiac catheterization on 04/01/2015 which showed a 95% in-stent restenosis in SVG to OM, treated with 2.75 x 24 mm Synergy DES postdilated to 3.3 mm. He had patent LIMA to LAD, patent SVG to posterolateral, patent SVG to PDA.    Of note he did have atrial fibrillation with rate control on presentation but he converted spontaneously. He was not started on warfarin because he was taking aspirin and Plavix.  He has had no symptomatic recurrence of this arrhythmia.   He wore an event monitor and there was no evidence of atrial fib.  He was having SOB in Dec 2018.  On [place] he had a fixed inferior defect.  CPX testing suggested a mixed restrictive/obstructive pattern of PFTs.  He was not thought to have a cardiovascular limitation.    He returns for follow up.   Since I last saw him he was in the hospital in October with acute on chronic diastolic HF.  I reviewed these records for this visit.  He was back in atrial fib and was treated with Eliquis  ***   He says now that he is not getting short of breath that he wants.  Thinks he has something to do with humidity.  Does not have any bouts of dyspnea.  He does not have PND or orthopnea.  He has no palpitations, presyncope or syncope.  He can remain somewhat active in his yard without limitations.  Allergies  Allergen Reactions  . Codeine   . Morphine     REACTION: does not like    Current Outpatient Medications  Medication Sig Dispense Refill  . acetaminophen (TYLENOL) 325 MG tablet Take 2 tablets (650 mg total) by mouth every 6 (six) hours as needed for mild pain, fever or headache. 60 tablet 1  . albuterol (PROVENTIL) (2.5 MG/3ML) 0.083% nebulizer solution Take 3 mLs (2.5 mg total) by nebulization every 4 (four) hours as needed for wheezing or shortness of breath. (Patient not taking: Reported on 08/06/2019) 75 mL 3  . amLODipine (NORVASC) 5 MG tablet Take  1 tablet (5 mg total) by mouth daily. 30 tablet 3  . apixaban (ELIQUIS) 2.5 MG TABS tablet Take 1 tablet (2.5 mg total) by mouth 2 (two) times daily. 60 tablet 3  . carvedilol (COREG) 6.25 MG tablet Take 1 tablet (6.25 mg total) by mouth 2 (two) times daily with a meal. 180 tablet 3  . clopidogrel (PLAVIX) 75 MG tablet Take 1 tablet (75 mg total) by mouth daily. TAKE 1 TABLET BY MOUTH EVERY DAY (Patient taking differently: Take 75 mg by mouth daily. ) 90 tablet 3  . docusate sodium (STOOL SOFTENER) 100 MG capsule Take 1 capsule (100 mg total) by mouth 2 (two) times daily as needed for mild constipation. 30 capsule 0  . furosemide (LASIX) 40 MG tablet Take 1 tablet (40 mg total) by mouth 2 (two) times daily. 120 tablet 1  . glucose blood (FREESTYLE LITE) test strip 1 each by Other route as needed. Use as instructed     . Lancets (FREESTYLE) lancets 1 each by Other route as needed. Use as instructed     . Multiple Vitamins-Minerals (ICAPS AREDS 2 PO) Take 1 capsule by mouth daily.    . nitroGLYCERIN (NITROSTAT) 0.4 MG SL tablet Place 1 tablet (0.4 mg total) under the tongue every 5 (five) minutes x 3 doses as needed  for chest pain (if no relief after 3rd dose, proceed to the ED for an evaluation). 75 tablet 1  . ondansetron (ZOFRAN) 4 MG tablet Take 1 tablet (4 mg total) by mouth every 6 (six) hours as needed for nausea. 20 tablet 0  . pantoprazole (PROTONIX) 40 MG tablet TAKE 1 TABLET BY MOUTH EVERY DAY (Patient taking differently: Take 40 mg by mouth every evening. ) 90 tablet 1  . rosuvastatin (CRESTOR) 20 MG tablet TAKE 1 TABLET BY MOUTH EVERY DAY (Patient taking differently: Take 20 mg by mouth every evening. ) 90 tablet 1  . sitaGLIPtin (JANUVIA) 50 MG tablet Take 1 tablet (50 mg total) by mouth daily. 30 tablet 3  . spironolactone (ALDACTONE) 25 MG tablet Take 1 tablet (25 mg total) by mouth daily. 90 tablet 2  . traMADol (ULTRAM) 50 MG tablet Take 1 tablet (50 mg total) by mouth every 6 (six)  hours as needed. 1-2 every 6 h prn pain (Patient taking differently: Take 50-100 mg by mouth every 6 (six) hours as needed for moderate pain. ) 120 tablet 5   No current facility-administered medications for this visit.     Past Medical History:  Diagnosis Date  . Arthritis   . CAD (coronary artery disease)    a. Cath September 2015 LIMA to the LAD patent, SVG to PDA patent, SVG to posterior lateral patent, SVG to OM with a 90% in-stent restenosis at an anastomotic lesion. This was treated with angioplasty. b. cath 04/01/2015 95% ISR in SVG to OM treated with 2.75x24 Synergy DES postdilated to 3.60mm, all other grafts patent  . Cataract    surgery,B/L  . CHF (congestive heart failure) ([place])   . Chronic kidney disease    nephrolithiasis  . Diabetes mellitus    TYPE 2  . GERD (gastroesophageal reflux disease)   . Heart murmur   . Hernia   . Hypercholesterolemia   . Hypertension   . Incontinence    hx  over 1 year,leaks without awareness  . Other and unspecified diseases of appendix   . PAF (paroxysmal atrial fibrillation) ([place])    in the setting of ischemia on 03/31/2015  . Pancreatitis   . Prostate CA ([place]) 03/01/04   prostate bx=Adenocarcinoam,gleason 3+4=7,PSA=6.75  . Shortness of breath dyspnea   . Ulcer    hx gastric    Past Surgical History:  Procedure Laterality Date  . APPENDECTOMY    . BACK SURGERY    . CARDIAC CATHETERIZATION N/A 04/01/2015   Procedure: Left Heart Cath and Cors/Grafts Angiography;  Surgeon: Jettie Booze, MD;  Location: [facility];  Service: Cardiovascular;  Laterality: N/A;  . CARDIAC CATHETERIZATION N/A 04/01/2015   Procedure: Coronary Stent Intervention;  Surgeon: Jettie Booze, MD;  Location: [facility];  Service: Cardiovascular;  Laterality: N/A;  . CARPAL TUNNEL RELEASE  2004   both hands  . CORONARY ARTERY BYPASS GRAFT    . CYSTOSCOPY  08/23/12   incomplete emoptying bladder  . HERNIA REPAIR    . incision and  drainage of right chest abscess    . LAPAROSCOPIC CHOLECYSTECTOMY  09/2009  . LEFT HEART CATHETERIZATION WITH CORONARY ANGIOGRAM N/A 07/19/2014   Procedure: LEFT HEART CATHETERIZATION WITH CORONARY ANGIOGRAM;  Surgeon: Leonie Man, MD;  Location: [facility] CATH LAB;  Service: Cardiovascular;  Laterality: N/A;  . RECTAL SURGERY    . ROBOT ASSISTED LAPAROSCOPIC RADICAL PROSTATECTOMY  2005    ROS:   ***   PHYSICAL  EXAM There were no vitals taken for this visit.  GENERAL:  Well appearing NECK:  No jugular venous distention, waveform within normal limits, carotid upstroke brisk and symmetric, no bruits, no thyromegaly LUNGS:  Clear to auscultation bilaterally CHEST:  Unremarkable HEART:  PMI not displaced or sustained,S1 and S2 within normal limits, no S3, no clicks, no rubs, *** murmurs, irregular  ABD:  Flat, positive bowel sounds normal in frequency in pitch, no bruits, no rebound, no guarding, no midline pulsatile mass, no hepatomegaly, no splenomegaly EXT:  2 plus pulses throughout, no edema, no cyanosis no clubbing     ***ENERAL:  Well appearing NECK:  No jugular venous distention, waveform within normal limits, carotid upstroke brisk and symmetric, no bruits, no thyromegaly LUNGS:  Clear to auscultation bilaterally CHEST:  Well healed sternotomy scar. HEART:  PMI not displaced or sustained,S1 and S2 within normal limits, no S3, no S4, no clicks, no rubs, no murmurs ABD:  Flat, positive bowel sounds normal in frequency in pitch, no bruits, no rebound, no guarding, no midline pulsatile mass, no hepatomegaly, no splenomegaly EXT:  2 plus pulses throughout, no edema, no cyanosis no clubbing  EKG:   ***     Lab Results  Component Value Date   CHOL 117 03/14/2019   TRIG 182.0 (H) 03/14/2019   HDL 34.60 (L) 03/14/2019   LDLCALC 46 03/14/2019   LDLDIRECT 143.3 11/23/2007   Lab Results  Component Value Date   HGBA1C 7.0 (A) 06/15/2019   Lab Results  Component Value Date    CREATININE 2.04 (H) 08/06/2019   Lab Results  Component Value Date   HGBA1C 7.0 (A) 06/15/2019     ASSESSMENT AND PLAN   CORONARY ARTERY DISEASE  He has a negative ischemia work-up last year.  There was no suggestion of cardiac limitation on the cardiopulmonary stress test which I reviewed recently.  ***  for this visit.  Is not having any active chest pain.  Therefore, no further cardiac testing is indicated.   Atrial fib He was in atrial fib during his recent hospitalization.  *** He had no symptomatic documentation of this since he was in the hospital 3 years ago.  No change in therapy.  Hypertension  The blood pressure is ***  lightly elevated but this is unusual.  Home is in the systolic 893Y.  No change in therapy.   AS:   He had mild AS on echo in Oct.  in June 2019.  No change in therapy.  Carotid stenosis-Bilateral  He has moderate left disease in June.  We reviewed this.  He was 60 to 79% on the left and will follow in 1 year. *** We talked about symptoms he should talk about if he had these I will consider further imaging.  CKD: His creatinine was increased up to 2.04.  ***  1.65.  We reviewed the physiology of this and detail and he understands.  DYSPNEA:   ***  This seems to be baseline or better than previous.  If he has continued shortness of breath I would probably send him to see a pulmonologist.  Of note he had documented saturations at 95% walking around the office previously despite being very short of breath.  Some of this was subjective more than objective.

## 2019-08-30 ENCOUNTER — Encounter: Payer: Self-pay | Admitting: Cardiology

## 2019-08-30 ENCOUNTER — Ambulatory Visit: Payer: Medicare Other | Admitting: Cardiology

## 2019-08-30 ENCOUNTER — Other Ambulatory Visit: Payer: Self-pay

## 2019-08-30 VITALS — BP 132/60 | HR 72 | Ht 69.0 in | Wt 185.8 lb

## 2019-08-30 DIAGNOSIS — I4891 Unspecified atrial fibrillation: Secondary | ICD-10-CM | POA: Diagnosis not present

## 2019-08-30 DIAGNOSIS — I2510 Atherosclerotic heart disease of native coronary artery without angina pectoris: Secondary | ICD-10-CM | POA: Diagnosis not present

## 2019-08-30 LAB — BASIC METABOLIC PANEL
BUN/Creatinine Ratio: 13 (ref 10–24)
BUN: 28 mg/dL — ABNORMAL HIGH (ref 8–27)
CO2: 23 mmol/L (ref 20–29)
Calcium: 10.2 mg/dL (ref 8.6–10.2)
Chloride: 98 mmol/L (ref 96–106)
Creatinine, Ser: 2.1 mg/dL — ABNORMAL HIGH (ref 0.76–1.27)
GFR calc Af Amer: 32 mL/min/{1.73_m2} — ABNORMAL LOW (ref >59)
GFR calc non Af Amer: 28 mL/min/{1.73_m2} — ABNORMAL LOW (ref >59)
Glucose: 131 mg/dL — ABNORMAL HIGH (ref 65–99)
Potassium: 4.1 mmol/L (ref 3.5–5.2)
Sodium: 138 mmol/L (ref 134–144)

## 2019-08-30 NOTE — Patient Instructions (Addendum)
Medication Instructions:  Take an extra 20mg  Lasix for daily for 2 days then go back to normal dose.  If you need a refill on your cardiac medications before your next appointment, please call your pharmacy.   Lab work: BMET If you have labs (blood work) drawn today and your tests are completely normal, you will receive your results only by: [place] (if you have MyChart) OR A paper copy in the mail If you have any lab test that is abnormal or we need to change your treatment, we will call you to review the results.  Testing/Procedures: Your physician has recommended that you wear a 2 day event monitor. Event monitors are medical devices that record the heart's electrical activity. Doctors most often [place] these monitors to diagnose arrhythmias. Arrhythmias are problems with the speed or rhythm of the heartbeat. The monitor is a small, portable device. You can wear one while you do your normal daily activities. This is usually used to diagnose what is causing palpitations/syncope (passing out). You will get a call from Markus Daft and it will be mailed to you.    Follow-Up: At [facility], you and your health needs are our priority.  As part of our continuing mission to provide you with exceptional heart care, we have created designated Provider Care Teams.  These Care Teams include your primary Cardiologist (physician) and Advanced Practice Providers (APPs -  Physician Assistants and Nurse Practitioners) who all work together to provide you with the care you need, when you need it. You may see [name], MD or one of the following Advanced Practice Providers on your designated Care Team:    Rosaria Ferries, PA-C  Jory Sims, DNP, ANP  Cadence Kathlen Mody, NP    Your physician wants you to follow-up in: 1 week with Dr Percival Spanish or a APP. Your wife is allowed to accompany you per Dr Percival Spanish.

## 2019-08-30 NOTE — Progress Notes (Signed)
HPI The patient presents for followup of CAD.   He had chest pain and had a cardiac catheterization on 04/01/2015 which showed a 95% in-stent restenosis in SVG to OM, treated with 2.75 x 24 mm Synergy DES postdilated to 3.3 mm. He had patent LIMA to LAD, patent SVG to posterolateral, patent SVG to PDA.    Of note he did have atrial fibrillation with rate control on presentation but he converted spontaneously. He was not started on warfarin because he was taking aspirin and Plavix.  He has had no symptomatic recurrence of this arrhythmia.   He wore an event monitor and there was no evidence of atrial fib.  He was having SOB in Dec 2018.  On [place] he had a fixed inferior defect.  CPX testing suggested a mixed restrictive/obstructive pattern of PFTs.  He was not thought to have a cardiovascular limitation.    He returns for follow up.   Since I last saw him he was in the hospital in October with acute on chronic diastolic HF.  I reviewed these records for this visit.  He was back in atrial fib and was treated with Eliquis The patient has very limited understanding of his disease process.  He does do good with salt restriction.  He said that in the days leading up to his hospitalization his weight he got up 7 to 8 pounds over about 6 days.  He does not take increased diuretic when that happens.  Going home he said he weighed 176 pounds and now is up to 180/4 days.  He has chronic shortness of breath.  Has been using a nebulizer which she got from the hospital.  He says this helps for a day.  He has had some trace lower extremity swelling.  He has some rare palpitations and would not know that he is in atrial fibrillation.  He is not having any acute PND or orthopnea.  He is not having any acute chest pressure, neck or arm discomfort.  He has had no new cough fevers or chills.   Allergies  Allergen Reactions  . Codeine   . Morphine     REACTION: does not like    Current Outpatient  Medications  Medication Sig Dispense Refill  . acetaminophen (TYLENOL) 325 MG tablet Take 2 tablets (650 mg total) by mouth every 6 (six) hours as needed for mild pain, fever or headache. 60 tablet 1  . albuterol (PROVENTIL) (2.5 MG/3ML) 0.083% nebulizer solution Take 3 mLs (2.5 mg total) by nebulization every 4 (four) hours as needed for wheezing or shortness of breath. 75 mL 3  . amLODipine (NORVASC) 5 MG tablet Take 1 tablet (5 mg total) by mouth daily. 30 tablet 3  . apixaban (ELIQUIS) 2.5 MG TABS tablet Take 1 tablet (2.5 mg total) by mouth 2 (two) times daily. 60 tablet 3  . carvedilol (COREG) 6.25 MG tablet Take 1 tablet (6.25 mg total) by mouth 2 (two) times daily with a meal. 180 tablet 3  . clopidogrel (PLAVIX) 75 MG tablet Take 1 tablet (75 mg total) by mouth daily. TAKE 1 TABLET BY MOUTH EVERY DAY 90 tablet 3  . docusate sodium (STOOL SOFTENER) 100 MG capsule Take 1 capsule (100 mg total) by mouth 2 (two) times daily as needed for mild constipation. 30 capsule 0  . furosemide (LASIX) 40 MG tablet Take 1 tablet (40 mg total) by mouth 2 (two) times daily. 120 tablet 1  . glucose blood (  FREESTYLE LITE) test strip 1 each by Other route as needed. Use as instructed     . Lancets (FREESTYLE) lancets 1 each by Other route as needed. Use as instructed     . Multiple Vitamins-Minerals (ICAPS AREDS 2 PO) Take 1 capsule by mouth daily.    . nitroGLYCERIN (NITROSTAT) 0.4 MG SL tablet Place 1 tablet (0.4 mg total) under the tongue every 5 (five) minutes x 3 doses as needed for chest pain (if no relief after 3rd dose, proceed to the ED for an evaluation). 75 tablet 1  . ondansetron (ZOFRAN) 4 MG tablet Take 1 tablet (4 mg total) by mouth every 6 (six) hours as needed for nausea. 20 tablet 0  . pantoprazole (PROTONIX) 40 MG tablet TAKE 1 TABLET BY MOUTH EVERY DAY 90 tablet 1  . rosuvastatin (CRESTOR) 20 MG tablet TAKE 1 TABLET BY MOUTH EVERY DAY 90 tablet 1  . sitaGLIPtin (JANUVIA) 50 MG tablet Take 1  tablet (50 mg total) by mouth daily. 30 tablet 3  . spironolactone (ALDACTONE) 25 MG tablet Take 1 tablet (25 mg total) by mouth daily. 90 tablet 2  . traMADol (ULTRAM) 50 MG tablet Take 1 tablet (50 mg total) by mouth every 6 (six) hours as needed. 1-2 every 6 h prn pain 120 tablet 5   No current facility-administered medications for this visit.     Past Medical History:  Diagnosis Date  . Arthritis   . CAD (coronary artery disease)    a. Cath September 2015 LIMA to the LAD patent, SVG to PDA patent, SVG to posterior lateral patent, SVG to OM with a 90% in-stent restenosis at an anastomotic lesion. This was treated with angioplasty. b. cath 04/01/2015 95% ISR in SVG to OM treated with 2.75x24 Synergy DES postdilated to 3.45mm, all other grafts patent  . Cataract    surgery,B/L  . CHF (congestive heart failure) ([place])   . Chronic kidney disease    nephrolithiasis  . Diabetes mellitus    TYPE 2  . GERD (gastroesophageal reflux disease)   . Heart murmur   . Hernia   . Hypercholesterolemia   . Hypertension   . Incontinence    hx  over 1 year,leaks without awareness  . Other and unspecified diseases of appendix   . PAF (paroxysmal atrial fibrillation) ([place])    in the setting of ischemia on 03/31/2015  . Pancreatitis   . Prostate CA ([place]) 03/01/04   prostate bx=Adenocarcinoam,gleason 3+4=7,PSA=6.75  . Shortness of breath dyspnea   . Ulcer    hx gastric    Past Surgical History:  Procedure Laterality Date  . APPENDECTOMY    . BACK SURGERY    . CARDIAC CATHETERIZATION N/A 04/01/2015   Procedure: Left Heart Cath and Cors/Grafts Angiography;  Surgeon: Jettie Booze, MD;  Location: [facility];  Service: Cardiovascular;  Laterality: N/A;  . CARDIAC CATHETERIZATION N/A 04/01/2015   Procedure: Coronary Stent Intervention;  Surgeon: Jettie Booze, MD;  Location: [facility];  Service: Cardiovascular;  Laterality: N/A;  . CARPAL TUNNEL RELEASE  2004   both hands  .  CORONARY ARTERY BYPASS GRAFT    . CYSTOSCOPY  08/23/12   incomplete emoptying bladder  . HERNIA REPAIR    . incision and drainage of right chest abscess    . LAPAROSCOPIC CHOLECYSTECTOMY  09/2009  . LEFT HEART CATHETERIZATION WITH CORONARY ANGIOGRAM N/A 07/19/2014   Procedure: LEFT HEART CATHETERIZATION WITH CORONARY ANGIOGRAM;  Surgeon: Leonie Man,  MD;  Location: [facility];  Service: Cardiovascular;  Laterality: N/A;  . RECTAL SURGERY    . ROBOT ASSISTED LAPAROSCOPIC RADICAL PROSTATECTOMY  2005    ROS:   As stated in the HPI and negative for all other systems.   PHYSICAL EXAM BP 132/60   Pulse 72   Ht 5\' 9"  (1.753 m)   Wt 185 lb 12.8 oz (84.3 kg)   SpO2 97%   BMI 27.44 kg/m   GENERAL:  Well appearing NECK:  No jugular venous distention, waveform within normal limits, carotid upstroke brisk and symmetric, no bruits, no thyromegaly LUNGS:  Clear to auscultation bilaterally CHEST:  Unremarkable HEART:  PMI not displaced or sustained,S1 and S2 within normal limits, no S3, no clicks, no rubs, no murmurs, irregular  ABD:  Flat, positive bowel sounds normal in frequency in pitch, no bruits, no rebound, no guarding, no midline pulsatile mass, no hepatomegaly, no splenomegaly EXT:  2 plus pulses throughout, no edema, no cyanosis no clubbing   EKG:   Atrial fibrillation with slow ventricular rate, 64, left bundle branch block.   08/30/2019    Lab Results  Component Value Date   CHOL 117 03/14/2019   TRIG 182.0 (H) 03/14/2019   HDL 34.60 (L) 03/14/2019   LDLCALC 46 03/14/2019   LDLDIRECT 143.3 11/23/2007   Lab Results  Component Value Date   HGBA1C 7.0 (A) 06/15/2019   Lab Results  Component Value Date   CREATININE 2.04 (H) 08/06/2019   Lab Results  Component Value Date   HGBA1C 7.0 (A) 06/15/2019     ASSESSMENT AND PLAN   CORONARY ARTERY DISEASE  He has a negative ischemia work-up last year.  There was no suggestion of cardiac limitation on the  cardiopulmonary stress test which I reviewed recently.  No further testing for ischemia.   Atrial fib He was in atrial fib during his recent hospitalization.    I will apply a 48 hour monitor.  I want to understand rate control and whether this is persistent.  He will stay on anticoagulation and he might benefit from cardioversion based on the results of the monitor.   Hypertension  The blood pressure is at target.  No change in therapy.  AS:   He had mild AS on echo in Oct.  in June 2019.  No change in therapy.  Carotid stenosis-Bilateral  He has moderate left disease in June.  We reviewed this.  He was 60 to 79% on the left and will follow in 1 year.   CKD: His creatinine was increased up to 2.04.  Check a basic metabolic profile today.  ACUTE ON CHRONIC DIASTOLIC HF:   This may be exacerbated by the fibrillation.  To be very careful about diuresis only to give him an extra 20 mg of Lasix for 2 days.  He will get a basic metabolic profile today.  We will have him come back next week another basic metabolic profile and also an office visit to make sure we stay on top of his volume.  Though he was a little bit reluctant I called and spoke with his wife who manages his medications so that we could reinforce this as his medical literacy is not excellent.

## 2019-09-06 NOTE — Progress Notes (Signed)
HPI The patient presents for followup of CAD.   He had chest pain and had a cardiac catheterization on 04/01/2015 which showed a 95% in-stent restenosis in SVG to OM, treated with 2.75 x 24 mm Synergy DES postdilated to 3.3 mm. He had patent LIMA to LAD, patent SVG to posterolateral, patent SVG to PDA.    Of note he did have atrial fibrillation with rate control on presentation but he converted spontaneously. He was not started on warfarin because he was taking aspirin and Plavix.  He has had no symptomatic recurrence of this arrhythmia.   He wore an event monitor and there was no evidence of atrial fib.  He was having SOB in Dec 2018.  On [place] he had a fixed inferior defect.  CPX testing suggested a mixed restrictive/obstructive pattern of PFTs.  He was not thought to have a cardiovascular limitation.    He returns for follow up.   He is still feeling short of breath despite the Lasix.  His weights have been stable.  He denies any PND or orthopnea.  He has had no new chest pressure, neck or arm discomfort.  He does not feel his fibrillation.   Allergies  Allergen Reactions  . Codeine   . Morphine     REACTION: does not like    Current Outpatient Medications  Medication Sig Dispense Refill  . acetaminophen (TYLENOL) 325 MG tablet Take 2 tablets (650 mg total) by mouth every 6 (six) hours as needed for mild pain, fever or headache. 60 tablet 1  . albuterol (PROVENTIL) (2.5 MG/3ML) 0.083% nebulizer solution Take 3 mLs (2.5 mg total) by nebulization every 4 (four) hours as needed for wheezing or shortness of breath. 75 mL 3  . amLODipine (NORVASC) 5 MG tablet Take 1 tablet (5 mg total) by mouth daily. 30 tablet 3  . apixaban (ELIQUIS) 2.5 MG TABS tablet Take 1 tablet (2.5 mg total) by mouth 2 (two) times daily. 60 tablet 3  . carvedilol (COREG) 6.25 MG tablet Take 1 tablet (6.25 mg total) by mouth 2 (two) times daily with a meal. 180 tablet 3  . clopidogrel (PLAVIX) 75 MG tablet  Take 1 tablet (75 mg total) by mouth daily. TAKE 1 TABLET BY MOUTH EVERY DAY 90 tablet 3  . docusate sodium (STOOL SOFTENER) 100 MG capsule Take 1 capsule (100 mg total) by mouth 2 (two) times daily as needed for mild constipation. 30 capsule 0  . furosemide (LASIX) 40 MG tablet Take 1 tablet (40 mg total) by mouth 2 (two) times daily. 120 tablet 1  . glucose blood (FREESTYLE LITE) test strip 1 each by Other route as needed. Use as instructed     . Lancets (FREESTYLE) lancets 1 each by Other route as needed. Use as instructed     . Multiple Vitamins-Minerals (ICAPS AREDS 2 PO) Take 1 capsule by mouth daily.    . nitroGLYCERIN (NITROSTAT) 0.4 MG SL tablet Place 1 tablet (0.4 mg total) under the tongue every 5 (five) minutes x 3 doses as needed for chest pain (if no relief after 3rd dose, proceed to the ED for an evaluation). 75 tablet 1  . ondansetron (ZOFRAN) 4 MG tablet Take 1 tablet (4 mg total) by mouth every 6 (six) hours as needed for nausea. 20 tablet 0  . pantoprazole (PROTONIX) 40 MG tablet TAKE 1 TABLET BY MOUTH EVERY DAY 90 tablet 1  . rosuvastatin (CRESTOR) 20 MG tablet TAKE 1 TABLET BY  MOUTH EVERY DAY 90 tablet 1  . sitaGLIPtin (JANUVIA) 50 MG tablet Take 1 tablet (50 mg total) by mouth daily. 30 tablet 3  . spironolactone (ALDACTONE) 25 MG tablet Take 1 tablet (25 mg total) by mouth daily. 90 tablet 2  . traMADol (ULTRAM) 50 MG tablet Take 1 tablet (50 mg total) by mouth every 6 (six) hours as needed. 1-2 every 6 h prn pain 120 tablet 5   No current facility-administered medications for this visit.     Past Medical History:  Diagnosis Date  . Arthritis   . CAD (coronary artery disease)    a. Cath September 2015 LIMA to the LAD patent, SVG to PDA patent, SVG to posterior lateral patent, SVG to OM with a 90% in-stent restenosis at an anastomotic lesion. This was treated with angioplasty. b. cath 04/01/2015 95% ISR in SVG to OM treated with 2.75x24 Synergy DES postdilated to 3.69mm, all  other grafts patent  . Cataract    surgery,B/L  . CHF (congestive heart failure) ([place])   . Chronic kidney disease    nephrolithiasis  . Diabetes mellitus    TYPE 2  . GERD (gastroesophageal reflux disease)   . Heart murmur   . Hernia   . Hypercholesterolemia   . Hypertension   . Incontinence    hx  over 1 year,leaks without awareness  . Other and unspecified diseases of appendix   . PAF (paroxysmal atrial fibrillation) ([place])    in the setting of ischemia on 03/31/2015  . Pancreatitis   . Prostate CA ([place]) 03/01/04   prostate bx=Adenocarcinoam,gleason 3+4=7,PSA=6.75  . Shortness of breath dyspnea   . Ulcer    hx gastric    Past Surgical History:  Procedure Laterality Date  . APPENDECTOMY    . BACK SURGERY    . CARDIAC CATHETERIZATION N/A 04/01/2015   Procedure: Left Heart Cath and Cors/Grafts Angiography;  Surgeon: Jettie Booze, MD;  Location: [facility];  Service: Cardiovascular;  Laterality: N/A;  . CARDIAC CATHETERIZATION N/A 04/01/2015   Procedure: Coronary Stent Intervention;  Surgeon: Jettie Booze, MD;  Location: [facility];  Service: Cardiovascular;  Laterality: N/A;  . CARPAL TUNNEL RELEASE  2004   both hands  . CORONARY ARTERY BYPASS GRAFT    . CYSTOSCOPY  08/23/12   incomplete emoptying bladder  . HERNIA REPAIR    . incision and drainage of right chest abscess    . LAPAROSCOPIC CHOLECYSTECTOMY  09/2009  . LEFT HEART CATHETERIZATION WITH CORONARY ANGIOGRAM N/A 07/19/2014   Procedure: LEFT HEART CATHETERIZATION WITH CORONARY ANGIOGRAM;  Surgeon: Leonie Man, MD;  Location: [facility];  Service: Cardiovascular;  Laterality: N/A;  . RECTAL SURGERY    . ROBOT ASSISTED LAPAROSCOPIC RADICAL PROSTATECTOMY  2005    ROS:   As stated in the HPI and negative for all other systems.   PHYSICAL EXAM BP 116/60   Pulse 74   Temp 98.6 F (37 C)   Ht 5\' 9"  (1.753 m)   Wt 186 lb 11.2 oz (84.7 kg)   SpO2 99%   BMI 27.57 kg/m   GENERAL:   Well appearing NECK:  No jugular venous distention, waveform within normal limits, carotid upstroke brisk and symmetric, no bruits, no thyromegaly LUNGS:  Clear to auscultation bilaterally CHEST:  Unremarkable HEART:  PMI not displaced or sustained,S1 and S2 within normal limits, no S3,  no clicks, no rubs, no murmurs ABD:  Flat, positive bowel sounds normal in frequency  in pitch, no bruits, no rebound, no guarding, no midline pulsatile mass, no hepatomegaly, no splenomegaly EXT:  2 plus pulses throughout, no edema, no cyanosis no clubbing   EKG: NA.     Lab Results  Component Value Date   CHOL 117 03/14/2019   TRIG 182.0 (H) 03/14/2019   HDL 34.60 (L) 03/14/2019   LDLCALC 46 03/14/2019   LDLDIRECT 143.3 11/23/2007   Lab Results  Component Value Date   HGBA1C 7.0 (A) 06/15/2019   Lab Results  Component Value Date   CREATININE 2.10 (H) 08/30/2019   Lab Results  Component Value Date   HGBA1C 7.0 (A) 06/15/2019     ASSESSMENT AND PLAN   CORONARY ARTERY DISEASE  He has a negative ischemia work-up last year.  ** There was no suggestion of cardiac limitation on the cardiopulmonary stress test which I reviewed recently.  No further testing for ischemia.   Atrial fib He is going to get a Holter.  If he is in persistent fibrillation I plan to bring him back for cardioversion. I don't think that this is contributing to his dyspnea but I want to do a therapeutic trial.   Hypertension  The blood pressure is at target.  No change in therapy.   AS:   He had mild AS on echo in Oct.  This is not contributing.  No change in therapy.  Carotid stenosis-Bilateral  He has moderate left disease in June.  I will follow this up next year.  CKD: His creatinine was increased up to 2.1 which was stable.  I will check another basic metabolic profile today.  ACUTE ON CHRONIC DIASTOLIC HF:   For now we will continue the meds as listed.  We talked a lot about salt and fluid.  He watches his  salt but he drinks a lot of water and he understands why I want him to limit that.   Of note he was very short of breath walking around the office today but his saturation stayed at 96%.

## 2019-09-07 ENCOUNTER — Ambulatory Visit: Payer: Medicare Other | Admitting: Family Medicine

## 2019-09-07 ENCOUNTER — Ambulatory Visit: Payer: Medicare Other | Admitting: Cardiology

## 2019-09-07 ENCOUNTER — Encounter: Payer: Self-pay | Admitting: Cardiology

## 2019-09-07 ENCOUNTER — Other Ambulatory Visit: Payer: Self-pay

## 2019-09-07 VITALS — BP 116/60 | HR 74 | Temp 98.6°F | Ht 69.0 in | Wt 186.7 lb

## 2019-09-07 DIAGNOSIS — I4819 Other persistent atrial fibrillation: Secondary | ICD-10-CM

## 2019-09-07 DIAGNOSIS — I5031 Acute diastolic (congestive) heart failure: Secondary | ICD-10-CM | POA: Diagnosis not present

## 2019-09-07 DIAGNOSIS — N184 Chronic kidney disease, stage 4 (severe): Secondary | ICD-10-CM | POA: Diagnosis not present

## 2019-09-07 DIAGNOSIS — I25118 Atherosclerotic heart disease of native coronary artery with other forms of angina pectoris: Secondary | ICD-10-CM

## 2019-09-07 LAB — BASIC METABOLIC PANEL
BUN/Creatinine Ratio: 12 (ref 10–24)
BUN: 23 mg/dL (ref 8–27)
CO2: 23 mmol/L (ref 20–29)
Calcium: 10 mg/dL (ref 8.6–10.2)
Chloride: 96 mmol/L (ref 96–106)
Creatinine, Ser: 1.94 mg/dL — ABNORMAL HIGH (ref 0.76–1.27)
GFR calc Af Amer: 36 mL/min/{1.73_m2} — ABNORMAL LOW (ref >59)
GFR calc non Af Amer: 31 mL/min/{1.73_m2} — ABNORMAL LOW (ref >59)
Glucose: 234 mg/dL — ABNORMAL HIGH (ref 65–99)
Potassium: 4.5 mmol/L (ref 3.5–5.2)
Sodium: 137 mmol/L (ref 134–144)

## 2019-09-07 NOTE — Patient Instructions (Signed)
Medication Instructions:  Your physician recommends that you continue on your current medications as directed. Please refer to the Current Medication list given to you today.  If you need a refill on your cardiac medications before your next appointment, please call your pharmacy.   Lab work: BMET If you have labs (blood work) drawn today and your tests are completely normal, you will receive your results only by: [place] (if you have MyChart) OR A paper copy in the mail If you have any lab test that is abnormal or we need to change your treatment, we will call you to review the results.  Testing/Procedures:  Your physician has recommended that you wear a 48hr holter monitor. Holter monitors are medical devices that record the heart's electrical activity. Doctors most often use these monitors to diagnose arrhythmias. Arrhythmias are problems with the speed or rhythm of the heartbeat. The monitor is a small, portable device. You can wear one while you do your normal daily activities. This is usually used to diagnose what is causing palpitations/syncope (passing out).  Follow-Up: At [facility], you and your health needs are our priority.  As part of our continuing mission to provide you with exceptional heart care, we have created designated Provider Care Teams.  These Care Teams include your primary Cardiologist (physician) and Advanced Practice Providers (APPs -  Physician Assistants and Nurse Practitioners) who all work together to provide you with the care you need, when you need it. You may see [name], MD or one of the following Advanced Practice Providers on your designated Care Team:    Rosaria Ferries, PA-C  Jory Sims, DNP, ANP  Cadence Kathlen Mody, NP   Your physician wants you to follow-up in: 1 year with an APP. You will receive a reminder letter in the mail two months in advance. If you don't receive a letter, please call our office to schedule the follow-up  appointment.

## 2019-09-20 ENCOUNTER — Telehealth: Payer: Self-pay | Admitting: *Deleted

## 2019-09-20 NOTE — Telephone Encounter (Signed)
3 day ZIO XT to be mailed to the patient home.  Instructions reviewed briefly as they are included in the monitor kit.

## 2019-09-30 ENCOUNTER — Ambulatory Visit (INDEPENDENT_AMBULATORY_CARE_PROVIDER_SITE_OTHER): Payer: Medicare Other

## 2019-09-30 DIAGNOSIS — I4819 Other persistent atrial fibrillation: Secondary | ICD-10-CM

## 2019-09-30 DIAGNOSIS — I5031 Acute diastolic (congestive) heart failure: Secondary | ICD-10-CM

## 2019-09-30 DIAGNOSIS — N184 Chronic kidney disease, stage 4 (severe): Secondary | ICD-10-CM

## 2019-10-03 ENCOUNTER — Other Ambulatory Visit: Payer: Self-pay | Admitting: Cardiology

## 2019-10-15 ENCOUNTER — Other Ambulatory Visit: Payer: Self-pay

## 2019-10-15 ENCOUNTER — Encounter: Payer: Self-pay | Admitting: Family Medicine

## 2019-10-15 ENCOUNTER — Ambulatory Visit: Payer: Medicare Other | Admitting: Family Medicine

## 2019-10-15 VITALS — BP 118/68 | HR 68 | Temp 97.5°F | Ht 69.0 in | Wt 182.3 lb

## 2019-10-15 DIAGNOSIS — I4819 Other persistent atrial fibrillation: Secondary | ICD-10-CM | POA: Diagnosis not present

## 2019-10-15 DIAGNOSIS — I159 Secondary hypertension, unspecified: Secondary | ICD-10-CM | POA: Diagnosis not present

## 2019-10-15 DIAGNOSIS — E1121 Type 2 diabetes mellitus with diabetic nephropathy: Secondary | ICD-10-CM

## 2019-10-15 DIAGNOSIS — R0600 Dyspnea, unspecified: Secondary | ICD-10-CM

## 2019-10-15 LAB — POCT GLYCOSYLATED HEMOGLOBIN (HGB A1C): Hemoglobin A1C: 6.8 % — AB (ref 4.0–5.6)

## 2019-10-15 NOTE — Progress Notes (Signed)
Subjective:     Patient ID: [name], male   DOB: 1933/02/16, 85 y.o.   MRN: [id number]  HPI  [name] has history of CAD, persistent atrial fibrillation, hypertension, type 2 diabetes, chronic kidney disease, history of prostate cancer, hyperlipidemia.  He has continued to have dyspnea with exertion.  Felt this may be related to his chronic atrial fibrillation.  He had recent event monitor and is waiting on follow-up regarding that.  He has not had any recent weight changes or increased peripheral edema.  He smoked only briefly for about 5 to 10 years and less than 1/2 pack/day during that time.  He is not monitoring blood sugars regularly.  No polyuria or polydipsia.  Medications reviewed and compliant with all.  He remains on low-dose Eliquis for his A. fib.  He is on Januvia low dosage for diabetes but no other diabetic medications.  Past Medical History:  Diagnosis Date  . Arthritis   . CAD (coronary artery disease)    a. Cath September 2015 LIMA to the LAD patent, SVG to PDA patent, SVG to posterior lateral patent, SVG to OM with a 90% in-stent restenosis at an anastomotic lesion. This was treated with angioplasty. b. cath 04/01/2015 95% ISR in SVG to OM treated with 2.75x24 Synergy DES postdilated to 3.49mm, all other grafts patent  . Cataract    surgery,B/L  . CHF (congestive heart failure) ([place])   . Chronic kidney disease    nephrolithiasis  . Diabetes mellitus    TYPE 2  . GERD (gastroesophageal reflux disease)   . Heart murmur   . Hernia   . Hypercholesterolemia   . Hypertension   . Incontinence    hx  over 1 year,leaks without awareness  . Other and unspecified diseases of appendix   . PAF (paroxysmal atrial fibrillation) ([place])    in the setting of ischemia on 03/31/2015  . Pancreatitis   . Prostate CA ([place]) 03/01/04   prostate bx=Adenocarcinoam,gleason 3+4=7,PSA=6.75  . Shortness of breath dyspnea   . Ulcer    hx gastric   Past Surgical History:  Procedure  Laterality Date  . APPENDECTOMY    . BACK SURGERY    . CARDIAC CATHETERIZATION N/A 04/01/2015   Procedure: Left Heart Cath and Cors/Grafts Angiography;  Surgeon: Jettie Booze, MD;  Location: [facility];  Service: Cardiovascular;  Laterality: N/A;  . CARDIAC CATHETERIZATION N/A 04/01/2015   Procedure: Coronary Stent Intervention;  Surgeon: Jettie Booze, MD;  Location: [place] [facility];  Service: Cardiovascular;  Laterality: N/A;  . CARPAL TUNNEL RELEASE  2004   both hands  . CORONARY ARTERY BYPASS GRAFT    . CYSTOSCOPY  08/23/12   incomplete emoptying bladder  . HERNIA REPAIR    . incision and drainage of right chest abscess    . LAPAROSCOPIC CHOLECYSTECTOMY  09/2009  . LEFT HEART CATHETERIZATION WITH CORONARY ANGIOGRAM N/A 07/19/2014   Procedure: LEFT HEART CATHETERIZATION WITH CORONARY ANGIOGRAM;  Surgeon: Leonie Man, MD;  Location: [facility] CATH LAB;  Service: Cardiovascular;  Laterality: N/A;  . RECTAL SURGERY    . ROBOT ASSISTED LAPAROSCOPIC RADICAL PROSTATECTOMY  2005    reports that he quit smoking about 47 years ago. His smoking use included cigarettes. He has a 2.50 pack-year smoking history. He has never used smokeless tobacco. He reports that he does not drink alcohol or use drugs. family history includes Cancer in his mother. Allergies  Allergen Reactions  . Codeine   .  Morphine     REACTION: does not like    Review of Systems  Constitutional: Negative for appetite change, chills, fatigue, fever and unexpected weight change.  Eyes: Negative for visual disturbance.  Respiratory: Positive for shortness of breath. Negative for cough, chest tightness and wheezing.   Cardiovascular: Negative for chest pain, palpitations and leg swelling.  Endocrine: Negative for polydipsia and polyuria.  Genitourinary: Negative for dysuria.  Neurological: Negative for dizziness, syncope, weakness, light-headedness and headaches.       Objective:   Physical  Exam Vitals reviewed.  Constitutional:      Appearance: Normal appearance.  Cardiovascular:     Comments: Irregular rhythm but rate controlled Pulmonary:     Comments: Good breath sounds throughout.  No wheezes.  No rales. Neurological:     General: No focal deficit present.     Mental Status: He is alert and oriented to person, place, and time.        Assessment:     #1 chronic dyspnea.  No evidence for systolic dysfunction by most recent echo.  Question related to chronic atrial fibrillation.  Doubt significant chronic lung disease  #2 hypertension stable and at goal  #3 type 2 diabetes improving with A1c 6.8%    Plan:     -Continue current medication regimen -We discussed option of either pulmonary referral or pulmonary function testing but at this point he declines -He is encouraged to continue close follow-up with cardiology -We will plan 12-month follow-up and sooner as needed  [name] MD [facility]

## 2019-10-17 ENCOUNTER — Other Ambulatory Visit: Payer: Self-pay | Admitting: Cardiology

## 2019-10-17 DIAGNOSIS — I5031 Acute diastolic (congestive) heart failure: Secondary | ICD-10-CM

## 2019-10-17 DIAGNOSIS — N184 Chronic kidney disease, stage 4 (severe): Secondary | ICD-10-CM

## 2019-10-17 DIAGNOSIS — I4819 Other persistent atrial fibrillation: Secondary | ICD-10-CM

## 2019-10-23 DIAGNOSIS — I5023 Acute on chronic systolic (congestive) heart failure: Secondary | ICD-10-CM | POA: Insufficient documentation

## 2019-10-23 DIAGNOSIS — Z7189 Other specified counseling: Secondary | ICD-10-CM | POA: Insufficient documentation

## 2019-10-23 NOTE — H&P (View-Only) (Signed)
Cardiology Office Note   Date:  10/25/2019   ID:  [name], DOB 12-Aug-1933, MRN [id number]  PCP:  [name], MD  Cardiologist:   [name], MD  Chief Complaint  Patient presents with  . Atrial Fibrillation      History of Present Illness: [name] is a 86 y.o. male who presents for followup of CAD.   He had chest pain and had a cardiac catheterization on 04/01/2015 which showed a 95% in-stent restenosis in SVG to OM, treated with 2.75 x 24 mm Synergy DES postdilated to 3.3 mm. He had patent LIMA to LAD, patent SVG to posterolateral, patent SVG to PDA.    Of note he did have atrial fibrillation with rate control on presentation but he converted spontaneously. He was not started on warfarin because he was taking aspirin and Plavix.   He was having SOB in Dec 2018.  On [place] he had a fixed inferior defect.  CPX testing suggested a mixed restrictive/obstructive pattern of PFTs.  He was not thought to have a cardiovascular limitation.  He has been in atrial fib.  I had him wear a Holter and he had 100% atrial fib on this monitor.  I called him and he agreed to undergo DCCV.  He comes back to discuss this.    He says he has felt short of breath sluggish as prescribed.  There is not been another clear etiology.  He is willing to have cardioversion.  He is not describing of his anticoagulant.   Past Medical History:  Diagnosis Date  . Arthritis   . CAD (coronary artery disease)    a. Cath September 2015 LIMA to the LAD patent, SVG to PDA patent, SVG to posterior lateral patent, SVG to OM with a 90% in-stent restenosis at an anastomotic lesion. This was treated with angioplasty. b. cath 04/01/2015 95% ISR in SVG to OM treated with 2.75x24 Synergy DES postdilated to 3.68mm, all other grafts patent  . Cataract    surgery,B/L  . CHF (congestive heart failure) ([place])   . Chronic kidney disease    nephrolithiasis  . Diabetes mellitus    TYPE 2  . GERD  (gastroesophageal reflux disease)   . Heart murmur   . Hernia   . Hypercholesterolemia   . Hypertension   . Incontinence    hx  over 1 year,leaks without awareness  . Other and unspecified diseases of appendix   . PAF (paroxysmal atrial fibrillation) ([place])    in the setting of ischemia on 03/31/2015  . Pancreatitis   . Prostate CA ([place]) 03/01/04   prostate bx=Adenocarcinoam,gleason 3+4=7,PSA=6.75  . Shortness of breath dyspnea   . Ulcer    hx gastric    Past Surgical History:  Procedure Laterality Date  . APPENDECTOMY    . BACK SURGERY    . CARDIAC CATHETERIZATION N/A 04/01/2015   Procedure: Left Heart Cath and Cors/Grafts Angiography;  Surgeon: Jettie Booze, MD;  Location: [place] CV LAB;  Service: Cardiovascular;  Laterality: N/A;  . CARDIAC CATHETERIZATION N/A 04/01/2015   Procedure: Coronary Stent Intervention;  Surgeon: Jettie Booze, MD;  Location: [place] CV LAB;  Service: Cardiovascular;  Laterality: N/A;  . CARPAL TUNNEL RELEASE  2004   both hands  . CORONARY ARTERY BYPASS GRAFT    . CYSTOSCOPY  08/23/12   incomplete emoptying bladder  . HERNIA REPAIR    . incision and drainage of right chest abscess    .  LAPAROSCOPIC CHOLECYSTECTOMY  09/2009  . LEFT HEART CATHETERIZATION WITH CORONARY ANGIOGRAM N/A 07/19/2014   Procedure: LEFT HEART CATHETERIZATION WITH CORONARY ANGIOGRAM;  Surgeon: Leonie Man, MD;  Location: [facility] CATH LAB;  Service: Cardiovascular;  Laterality: N/A;  . RECTAL SURGERY    . ROBOT ASSISTED LAPAROSCOPIC RADICAL PROSTATECTOMY  2005     Current Outpatient Medications  Medication Sig Dispense Refill  . acetaminophen (TYLENOL) 325 MG tablet Take 2 tablets (650 mg total) by mouth every 6 (six) hours as needed for mild pain, fever or headache. 60 tablet 1  . albuterol (PROVENTIL) (2.5 MG/3ML) 0.083% nebulizer solution Take 3 mLs (2.5 mg total) by nebulization every 4 (four) hours as needed for wheezing or shortness of breath. 75 mL 3  .  amLODipine (NORVASC) 5 MG tablet Take 1 tablet (5 mg total) by mouth daily. 30 tablet 3  . apixaban (ELIQUIS) 2.5 MG TABS tablet Take 1 tablet (2.5 mg total) by mouth 2 (two) times daily. 60 tablet 3  . carvedilol (COREG) 6.25 MG tablet Take 1 tablet (6.25 mg total) by mouth 2 (two) times daily with a meal. 180 tablet 3  . clopidogrel (PLAVIX) 75 MG tablet Take 1 tablet (75 mg total) by mouth daily. TAKE 1 TABLET BY MOUTH EVERY DAY 90 tablet 3  . docusate sodium (STOOL SOFTENER) 100 MG capsule Take 1 capsule (100 mg total) by mouth 2 (two) times daily as needed for mild constipation. 30 capsule 0  . furosemide (LASIX) 40 MG tablet TAKE 1 TABLET BY MOUTH EVERY DAY 90 tablet 2  . glucose blood (FREESTYLE LITE) test strip 1 each by Other route as needed. Use as instructed     . Lancets (FREESTYLE) lancets 1 each by Other route as needed. Use as instructed     . Multiple Vitamins-Minerals (ICAPS AREDS 2 PO) Take 1 capsule by mouth daily.    . nitroGLYCERIN (NITROSTAT) 0.4 MG SL tablet Place 1 tablet (0.4 mg total) under the tongue every 5 (five) minutes x 3 doses as needed for chest pain (if no relief after 3rd dose, proceed to the ED for an evaluation). 75 tablet 1  . ondansetron (ZOFRAN) 4 MG tablet Take 1 tablet (4 mg total) by mouth every 6 (six) hours as needed for nausea. 20 tablet 0  . pantoprazole (PROTONIX) 40 MG tablet TAKE 1 TABLET BY MOUTH EVERY DAY 90 tablet 1  . rosuvastatin (CRESTOR) 20 MG tablet TAKE 1 TABLET BY MOUTH EVERY DAY 90 tablet 1  . sitaGLIPtin (JANUVIA) 50 MG tablet Take 1 tablet (50 mg total) by mouth daily. 30 tablet 3  . spironolactone (ALDACTONE) 25 MG tablet Take 1 tablet (25 mg total) by mouth daily. 90 tablet 2  . traMADol (ULTRAM) 50 MG tablet Take 1 tablet (50 mg total) by mouth every 6 (six) hours as needed. 1-2 every 6 h prn pain 120 tablet 5   No current facility-administered medications for this visit.    Allergies:   Codeine and Morphine    ROS:  Please see  the history of present illness.   Otherwise, review of systems are positive for none.   All other systems are reviewed and negative.    PHYSICAL EXAM: VS:  BP 138/60   Pulse 75   Temp (!) 97.5 F (36.4 C)   Ht 5\' 9"  (1.753 m)   Wt 185 lb 3.2 oz (84 kg)   SpO2 98%   BMI 27.35 kg/m  , BMI Body mass index is  27.35 kg/m. GENERAL:  Well appearing NECK:  No jugular venous distention, waveform within normal limits, carotid upstroke brisk and symmetric, no bruits, no thyromegaly LUNGS:  Clear to auscultation bilaterally CHEST:  Unremarkable HEART:  PMI not displaced or sustained,S1 and S2 within normal limits, no S3,  no clicks, no rubs, soft apical systolic murmur nonradiating, no diastolic murmurs, irregular  ABD:  Flat, positive bowel sounds normal in frequency in pitch, no bruits, no rebound, no guarding, no midline pulsatile mass, no hepatomegaly, no splenomegaly EXT:  2 plus pulses throughout, no edema, no cyanosis no clubbing   EKG:  EKG is ordered today. The ekg ordered today demonstrates atrial fibrillation, rate 75, left bundle branch block   Recent Labs: 11/01/2018: Pro B Natriuretic peptide (BNP) 191.0 03/14/2019: ALT 16 07/26/2019: B Natriuretic Peptide 441.0 07/28/2019: Hemoglobin 13.0; Platelets 153; TSH 6.832 09/07/2019: BUN 23; Creatinine, Ser 1.94; Potassium 4.5; Sodium 137    Lipid Panel    Component Value Date/Time   CHOL 117 03/14/2019 0955   TRIG 182.0 (H) 03/14/2019 0955   TRIG 306 (HH) 09/27/2006 1154   HDL 34.60 (L) 03/14/2019 0955   CHOLHDL 3 03/14/2019 0955   VLDL 36.4 03/14/2019 0955   LDLCALC 46 03/14/2019 0955   LDLDIRECT 143.3 11/23/2007 0000      Wt Readings from Last 3 Encounters:  10/25/19 185 lb 3.2 oz (84 kg)  10/15/19 182 lb 4.8 oz (82.7 kg)  09/07/19 186 lb 11.2 oz (84.7 kg)      Other studies Reviewed: Additional studies/ records that were reviewed today include: None. Review of the above records demonstrates:  Please see elsewhere  in the note.     ASSESSMENT AND PLAN:  CAD  He has a negative ischemia work-up last year.    No further work-up.  Atrial fib I plan a DCCV.  All the risk benefits have been described.  He agrees to proceed.   Hypertension  The blood pressure is at target.  No change in therapy.   AS:   He had mild AS on echo in Oct.  No change in therapy.  No further imaging.  Carotid stenosis-Bilateral  He has moderate left disease in June.  No change in therapy.  I will follow this up in 1 year.  CKD: Creatinine most recently was 1.94 in November.  This is stable.   Acute on chronic systolic and diastolic HF:  He seems to be euvolemic.  No change in therapy.   Covid 19 education: We gave him the telephone number to set up his vaccination.   Current medicines are reviewed at length with the patient today.  The patient does not have concerns regarding medicines.  The following changes have been made:  no change  Labs/ tests ordered today include:   Orders Placed This Encounter  Procedures  . Basic Metabolic Panel (BMET)  . CBC  . EKG 12-Lead     Disposition:   FU with me after the DCCV.     Signed, [name], MD  10/25/2019 12:18 PM    [facility]

## 2019-10-23 NOTE — Progress Notes (Signed)
Cardiology Office Note   Date:  10/25/2019   ID:  [name], DOB January 23, 1933, MRN [id number]  PCP:  [name], MD  Cardiologist:   [name], MD  Chief Complaint  Patient presents with  . Atrial Fibrillation      History of Present Illness: [name] is a 86 y.o. male who presents for followup of CAD.   He had chest pain and had a cardiac catheterization on 04/01/2015 which showed a 95% in-stent restenosis in SVG to OM, treated with 2.75 x 24 mm Synergy DES postdilated to 3.3 mm. He had patent LIMA to LAD, patent SVG to posterolateral, patent SVG to PDA.    Of note he did have atrial fibrillation with rate control on presentation but he converted spontaneously. He was not started on warfarin because he was taking aspirin and Plavix.   He was having SOB in Dec 2018.  On [place] he had a fixed inferior defect.  CPX testing suggested a mixed restrictive/obstructive pattern of PFTs.  He was not thought to have a cardiovascular limitation.  He has been in atrial fib.  I had him wear a Holter and he had 100% atrial fib on this monitor.  I called him and he agreed to undergo DCCV.  He comes back to discuss this.    He says he has felt short of breath sluggish as prescribed.  There is not been another clear etiology.  He is willing to have cardioversion.  He is not describing of his anticoagulant.   Past Medical History:  Diagnosis Date  . Arthritis   . CAD (coronary artery disease)    a. Cath September 2015 LIMA to the LAD patent, SVG to PDA patent, SVG to posterior lateral patent, SVG to OM with a 90% in-stent restenosis at an anastomotic lesion. This was treated with angioplasty. b. cath 04/01/2015 95% ISR in SVG to OM treated with 2.75x24 Synergy DES postdilated to 3.35mm, all other grafts patent  . Cataract    surgery,B/L  . CHF (congestive heart failure) ([place])   . Chronic kidney disease    nephrolithiasis  . Diabetes mellitus    TYPE 2  . GERD  (gastroesophageal reflux disease)   . Heart murmur   . Hernia   . Hypercholesterolemia   . Hypertension   . Incontinence    hx  over 1 year,leaks without awareness  . Other and unspecified diseases of appendix   . PAF (paroxysmal atrial fibrillation) ([place])    in the setting of ischemia on 03/31/2015  . Pancreatitis   . Prostate CA ([place]) 03/01/04   prostate bx=Adenocarcinoam,gleason 3+4=7,PSA=6.75  . Shortness of breath dyspnea   . Ulcer    hx gastric    Past Surgical History:  Procedure Laterality Date  . APPENDECTOMY    . BACK SURGERY    . CARDIAC CATHETERIZATION N/A 04/01/2015   Procedure: Left Heart Cath and Cors/Grafts Angiography;  Surgeon: Jettie Booze, MD;  Location: [place] [facility];  Service: Cardiovascular;  Laterality: N/A;  . CARDIAC CATHETERIZATION N/A 04/01/2015   Procedure: Coronary Stent Intervention;  Surgeon: Jettie Booze, MD;  Location: [place] CV LAB;  Service: Cardiovascular;  Laterality: N/A;  . CARPAL TUNNEL RELEASE  2004   both hands  . CORONARY ARTERY BYPASS GRAFT    . CYSTOSCOPY  08/23/12   incomplete emoptying bladder  . HERNIA REPAIR    . incision and drainage of right chest abscess    .  LAPAROSCOPIC CHOLECYSTECTOMY  09/2009  . LEFT HEART CATHETERIZATION WITH CORONARY ANGIOGRAM N/A 07/19/2014   Procedure: LEFT HEART CATHETERIZATION WITH CORONARY ANGIOGRAM;  Surgeon: Leonie Man, MD;  Location: [facility] CATH LAB;  Service: Cardiovascular;  Laterality: N/A;  . RECTAL SURGERY    . ROBOT ASSISTED LAPAROSCOPIC RADICAL PROSTATECTOMY  2005     Current Outpatient Medications  Medication Sig Dispense Refill  . acetaminophen (TYLENOL) 325 MG tablet Take 2 tablets (650 mg total) by mouth every 6 (six) hours as needed for mild pain, fever or headache. 60 tablet 1  . albuterol (PROVENTIL) (2.5 MG/3ML) 0.083% nebulizer solution Take 3 mLs (2.5 mg total) by nebulization every 4 (four) hours as needed for wheezing or shortness of breath. 75 mL 3  .  amLODipine (NORVASC) 5 MG tablet Take 1 tablet (5 mg total) by mouth daily. 30 tablet 3  . apixaban (ELIQUIS) 2.5 MG TABS tablet Take 1 tablet (2.5 mg total) by mouth 2 (two) times daily. 60 tablet 3  . carvedilol (COREG) 6.25 MG tablet Take 1 tablet (6.25 mg total) by mouth 2 (two) times daily with a meal. 180 tablet 3  . clopidogrel (PLAVIX) 75 MG tablet Take 1 tablet (75 mg total) by mouth daily. TAKE 1 TABLET BY MOUTH EVERY DAY 90 tablet 3  . docusate sodium (STOOL SOFTENER) 100 MG capsule Take 1 capsule (100 mg total) by mouth 2 (two) times daily as needed for mild constipation. 30 capsule 0  . furosemide (LASIX) 40 MG tablet TAKE 1 TABLET BY MOUTH EVERY DAY 90 tablet 2  . glucose blood (FREESTYLE LITE) test strip 1 each by Other route as needed. Use as instructed     . Lancets (FREESTYLE) lancets 1 each by Other route as needed. Use as instructed     . Multiple Vitamins-Minerals (ICAPS AREDS 2 PO) Take 1 capsule by mouth daily.    . nitroGLYCERIN (NITROSTAT) 0.4 MG SL tablet Place 1 tablet (0.4 mg total) under the tongue every 5 (five) minutes x 3 doses as needed for chest pain (if no relief after 3rd dose, proceed to the ED for an evaluation). 75 tablet 1  . ondansetron (ZOFRAN) 4 MG tablet Take 1 tablet (4 mg total) by mouth every 6 (six) hours as needed for nausea. 20 tablet 0  . pantoprazole (PROTONIX) 40 MG tablet TAKE 1 TABLET BY MOUTH EVERY DAY 90 tablet 1  . rosuvastatin (CRESTOR) 20 MG tablet TAKE 1 TABLET BY MOUTH EVERY DAY 90 tablet 1  . sitaGLIPtin (JANUVIA) 50 MG tablet Take 1 tablet (50 mg total) by mouth daily. 30 tablet 3  . spironolactone (ALDACTONE) 25 MG tablet Take 1 tablet (25 mg total) by mouth daily. 90 tablet 2  . traMADol (ULTRAM) 50 MG tablet Take 1 tablet (50 mg total) by mouth every 6 (six) hours as needed. 1-2 every 6 h prn pain 120 tablet 5   No current facility-administered medications for this visit.    Allergies:   Codeine and Morphine    ROS:  Please see  the history of present illness.   Otherwise, review of systems are positive for none.   All other systems are reviewed and negative.    PHYSICAL EXAM: VS:  BP 138/60   Pulse 75   Temp (!) 97.5 F (36.4 C)   Ht 5\' 9"  (1.753 m)   Wt 185 lb 3.2 oz (84 kg)   SpO2 98%   BMI 27.35 kg/m  , BMI Body mass index is  27.35 kg/m. GENERAL:  Well appearing NECK:  No jugular venous distention, waveform within normal limits, carotid upstroke brisk and symmetric, no bruits, no thyromegaly LUNGS:  Clear to auscultation bilaterally CHEST:  Unremarkable HEART:  PMI not displaced or sustained,S1 and S2 within normal limits, no S3,  no clicks, no rubs, soft apical systolic murmur nonradiating, no diastolic murmurs, irregular  ABD:  Flat, positive bowel sounds normal in frequency in pitch, no bruits, no rebound, no guarding, no midline pulsatile mass, no hepatomegaly, no splenomegaly EXT:  2 plus pulses throughout, no edema, no cyanosis no clubbing   EKG:  EKG is ordered today. The ekg ordered today demonstrates atrial fibrillation, rate 75, left bundle branch block   Recent Labs: 11/01/2018: Pro B Natriuretic peptide (BNP) 191.0 03/14/2019: ALT 16 07/26/2019: B Natriuretic Peptide 441.0 07/28/2019: Hemoglobin 13.0; Platelets 153; TSH 6.832 09/07/2019: BUN 23; Creatinine, Ser 1.94; Potassium 4.5; Sodium 137    Lipid Panel    Component Value Date/Time   CHOL 117 03/14/2019 0955   TRIG 182.0 (H) 03/14/2019 0955   TRIG 306 (HH) 09/27/2006 1154   HDL 34.60 (L) 03/14/2019 0955   CHOLHDL 3 03/14/2019 0955   VLDL 36.4 03/14/2019 0955   LDLCALC 46 03/14/2019 0955   LDLDIRECT 143.3 11/23/2007 0000      Wt Readings from Last 3 Encounters:  10/25/19 185 lb 3.2 oz (84 kg)  10/15/19 182 lb 4.8 oz (82.7 kg)  09/07/19 186 lb 11.2 oz (84.7 kg)      Other studies Reviewed: Additional studies/ records that were reviewed today include: None. Review of the above records demonstrates:  Please see elsewhere  in the note.     ASSESSMENT AND PLAN:  CAD  He has a negative ischemia work-up last year.    No further work-up.  Atrial fib I plan a DCCV.  All the risk benefits have been described.  He agrees to proceed.   Hypertension  The blood pressure is at target.  No change in therapy.   AS:   He had mild AS on echo in Oct.  No change in therapy.  No further imaging.  Carotid stenosis-Bilateral  He has moderate left disease in June.  No change in therapy.  I will follow this up in 1 year.  CKD: Creatinine most recently was 1.94 in November.  This is stable.   Acute on chronic systolic and diastolic HF:  He seems to be euvolemic.  No change in therapy.   Covid 19 education: We gave him the telephone number to set up his vaccination.   Current medicines are reviewed at length with the patient today.  The patient does not have concerns regarding medicines.  The following changes have been made:  no change  Labs/ tests ordered today include:   Orders Placed This Encounter  Procedures  . Basic Metabolic Panel (BMET)  . CBC  . EKG 12-Lead     Disposition:   FU with me after the DCCV.     Signed, [name], MD  10/25/2019 12:18 PM    [facility]

## 2019-10-25 ENCOUNTER — Other Ambulatory Visit: Payer: Self-pay

## 2019-10-25 ENCOUNTER — Ambulatory Visit: Payer: Medicare PPO | Admitting: Cardiology

## 2019-10-25 ENCOUNTER — Encounter: Payer: Self-pay | Admitting: Cardiology

## 2019-10-25 VITALS — BP 138/60 | HR 75 | Temp 97.5°F | Ht 69.0 in | Wt 185.2 lb

## 2019-10-25 DIAGNOSIS — N1832 Chronic kidney disease, stage 3b: Secondary | ICD-10-CM

## 2019-10-25 DIAGNOSIS — Z7189 Other specified counseling: Secondary | ICD-10-CM | POA: Diagnosis not present

## 2019-10-25 DIAGNOSIS — I4819 Other persistent atrial fibrillation: Secondary | ICD-10-CM

## 2019-10-25 DIAGNOSIS — I10 Essential (primary) hypertension: Secondary | ICD-10-CM

## 2019-10-25 DIAGNOSIS — I5023 Acute on chronic systolic (congestive) heart failure: Secondary | ICD-10-CM

## 2019-10-25 DIAGNOSIS — I350 Nonrheumatic aortic (valve) stenosis: Secondary | ICD-10-CM

## 2019-10-25 DIAGNOSIS — I2510 Atherosclerotic heart disease of native coronary artery without angina pectoris: Secondary | ICD-10-CM | POA: Diagnosis not present

## 2019-10-25 DIAGNOSIS — Z01812 Encounter for preprocedural laboratory examination: Secondary | ICD-10-CM

## 2019-10-25 NOTE — Patient Instructions (Addendum)
Medication Instructions:  No changes *If you need a refill on your cardiac medications before your next appointment, please call your pharmacy*  Lab Work: Your physician recommends that you return for lab work today for pre-procedure labs (CBC, BMET)  If you have labs (blood work) drawn today and your tests are completely normal, you will receive your results only by: Marland Kitchen MyChart Message (if you have MyChart) OR . A paper copy in the mail If you have any lab test that is abnormal or we need to change your treatment, we will call you to review the results.  Testing/Procedures:  You are scheduled for a Covid Screening on January 11th at 10:25am.This is a Drive Up Visit at the [facility] [address], [place]. Someone will direct you to the appropriate testing line. Stay in your car and someone will be with you shortly   Follow-Up: At [facility], you and your health needs are our priority.  As part of our continuing mission to provide you with exceptional heart care, we have created designated Provider Care Teams.  These Care Teams include your primary Cardiologist (physician) and Advanced Practice Providers (APPs -  Physician Assistants and Nurse Practitioners) who all work together to provide you with the care you need, when you need it.  Your next appointment:   2 month(s)  The format for your next appointment:   In Person  Provider:   [name], MD   Other Instructions Call [phone] to schedule your covid vaccine  Dear Mr. Twichell You are scheduled for a Cardioversion on Thursday January 14th with Dr. [name].  Please arrive at the [facility] (Main Entrance A) at [facility]: [place], [place] [address] at 7:30 am/pm.  DIET: Nothing to eat or drink after midnight except a sip of water with medications (see medication instructions below)  Medication Instructions: Hold Lasix(Furosemide), Spironalactone and Diabetic Medications  that  morning of the procedure  Continue your anticoagulant: Eliquis.  CALL THE OFFICE IF YOU MISS A DOSE OF ELIQUIS  You will need to continue your anticoagulant after your procedure until you are told by your Provider that it is safe to stop   Labs: BMET, CBC within 1 week  You must have a responsible person to drive you home and stay in the waiting area during your procedure. Failure to do so could result in cancellation.  Bring your insurance cards.  *Special Note: Every effort is made to have your procedure done on time. Occasionally there are emergencies that occur at the hospital that may cause delays. Please be patient if a delay does occur.

## 2019-10-26 LAB — CBC
Hematocrit: 46.1 % (ref 37.5–51.0)
Hemoglobin: 15.2 g/dL (ref 13.0–17.7)
MCH: 30.1 pg (ref 26.6–33.0)
MCHC: 33 g/dL (ref 31.5–35.7)
MCV: 91 fL (ref 79–97)
Platelets: 148 10*3/uL — ABNORMAL LOW (ref 150–450)
RBC: 5.05 x10E6/uL (ref 4.14–5.80)
RDW: 12.8 % (ref 11.6–15.4)
WBC: 10.3 10*3/uL (ref 3.4–10.8)

## 2019-10-26 LAB — BASIC METABOLIC PANEL
BUN/Creatinine Ratio: 12 (ref 10–24)
BUN: 20 mg/dL (ref 8–27)
CO2: 24 mmol/L (ref 20–29)
Calcium: 10.1 mg/dL (ref 8.6–10.2)
Chloride: 98 mmol/L (ref 96–106)
Creatinine, Ser: 1.72 mg/dL — ABNORMAL HIGH (ref 0.76–1.27)
GFR calc Af Amer: 41 mL/min/{1.73_m2} — ABNORMAL LOW (ref >59)
GFR calc non Af Amer: 36 mL/min/{1.73_m2} — ABNORMAL LOW (ref >59)
Glucose: 154 mg/dL — ABNORMAL HIGH (ref 65–99)
Potassium: 3.7 mmol/L (ref 3.5–5.2)
Sodium: 140 mmol/L (ref 134–144)

## 2019-10-29 ENCOUNTER — Other Ambulatory Visit (HOSPITAL_COMMUNITY)
Admission: RE | Admit: 2019-10-29 | Discharge: 2019-10-29 | Disposition: A | Discharge status: Home / Self Care | Payer: Medicare PPO | Source: Ambulatory Visit | Attending: Cardiovascular Disease | Admitting: Cardiovascular Disease

## 2019-10-29 DIAGNOSIS — Z01812 Encounter for preprocedural laboratory examination: Secondary | ICD-10-CM | POA: Insufficient documentation

## 2019-10-29 DIAGNOSIS — Z20822 Contact with and (suspected) exposure to covid-19: Secondary | ICD-10-CM | POA: Diagnosis not present

## 2019-10-30 LAB — NOVEL CORONAVIRUS, NAA (HOSP ORDER, SEND-OUT TO REF LAB; TAT 18-24 HRS): SARS-CoV-2, NAA: NOT DETECTED

## 2019-10-31 NOTE — Progress Notes (Signed)
Pre-call complete for patient procedure tomorrow. Patient states they have remained in quarantine since covid test. Patient states they have not missed any doses of blood thinner and will take it before coming to hospital. Patient is aware they must be NPO after midnight and that they have to have a driver drive them home after. All questions addressed. Patient to arrive by 0730.

## 2019-11-01 ENCOUNTER — Other Ambulatory Visit: Payer: Self-pay

## 2019-11-01 ENCOUNTER — Encounter (HOSPITAL_COMMUNITY): Payer: Self-pay | Admitting: Cardiovascular Disease

## 2019-11-01 ENCOUNTER — Ambulatory Visit (HOSPITAL_COMMUNITY): Payer: Medicare PPO | Admitting: Anesthesiology

## 2019-11-01 ENCOUNTER — Ambulatory Visit (HOSPITAL_COMMUNITY)
Admission: RE | Admit: 2019-11-01 | Discharge: 2019-11-01 | Disposition: A | Discharge status: Home / Self Care | Payer: Medicare PPO | Attending: Cardiovascular Disease | Admitting: Cardiovascular Disease

## 2019-11-01 ENCOUNTER — Encounter (HOSPITAL_COMMUNITY)
Admission: RE | Disposition: A | Discharge status: Home / Self Care | Payer: Medicare PPO | Source: Home / Self Care | Attending: Cardiovascular Disease

## 2019-11-01 DIAGNOSIS — I6523 Occlusion and stenosis of bilateral carotid arteries: Secondary | ICD-10-CM | POA: Insufficient documentation

## 2019-11-01 DIAGNOSIS — K219 Gastro-esophageal reflux disease without esophagitis: Secondary | ICD-10-CM | POA: Insufficient documentation

## 2019-11-01 DIAGNOSIS — Z79899 Other long term (current) drug therapy: Secondary | ICD-10-CM | POA: Diagnosis not present

## 2019-11-01 DIAGNOSIS — N184 Chronic kidney disease, stage 4 (severe): Secondary | ICD-10-CM | POA: Diagnosis not present

## 2019-11-01 DIAGNOSIS — N189 Chronic kidney disease, unspecified: Secondary | ICD-10-CM | POA: Diagnosis not present

## 2019-11-01 DIAGNOSIS — E7800 Pure hypercholesterolemia, unspecified: Secondary | ICD-10-CM | POA: Diagnosis not present

## 2019-11-01 DIAGNOSIS — I4891 Unspecified atrial fibrillation: Secondary | ICD-10-CM

## 2019-11-01 DIAGNOSIS — N179 Acute kidney failure, unspecified: Secondary | ICD-10-CM | POA: Diagnosis not present

## 2019-11-01 DIAGNOSIS — Z7901 Long term (current) use of anticoagulants: Secondary | ICD-10-CM | POA: Insufficient documentation

## 2019-11-01 DIAGNOSIS — I130 Hypertensive heart and chronic kidney disease with heart failure and stage 1 through stage 4 chronic kidney disease, or unspecified chronic kidney disease: Secondary | ICD-10-CM | POA: Diagnosis not present

## 2019-11-01 DIAGNOSIS — I5043 Acute on chronic combined systolic (congestive) and diastolic (congestive) heart failure: Secondary | ICD-10-CM | POA: Diagnosis not present

## 2019-11-01 DIAGNOSIS — M1990 Unspecified osteoarthritis, unspecified site: Secondary | ICD-10-CM | POA: Diagnosis not present

## 2019-11-01 DIAGNOSIS — E1122 Type 2 diabetes mellitus with diabetic chronic kidney disease: Secondary | ICD-10-CM | POA: Insufficient documentation

## 2019-11-01 DIAGNOSIS — Z7902 Long term (current) use of antithrombotics/antiplatelets: Secondary | ICD-10-CM | POA: Insufficient documentation

## 2019-11-01 DIAGNOSIS — Z8546 Personal history of malignant neoplasm of prostate: Secondary | ICD-10-CM | POA: Insufficient documentation

## 2019-11-01 DIAGNOSIS — Z7984 Long term (current) use of oral hypoglycemic drugs: Secondary | ICD-10-CM | POA: Diagnosis not present

## 2019-11-01 DIAGNOSIS — I2510 Atherosclerotic heart disease of native coronary artery without angina pectoris: Secondary | ICD-10-CM | POA: Insufficient documentation

## 2019-11-01 HISTORY — PX: CARDIOVERSION: SHX1299

## 2019-11-01 LAB — POCT I-STAT, CHEM 8
BUN: 25 mg/dL — ABNORMAL HIGH (ref 8–23)
Calcium, Ion: 1.22 mmol/L (ref 1.15–1.40)
Chloride: 100 mmol/L (ref 98–111)
Creatinine, Ser: 1.9 mg/dL — ABNORMAL HIGH (ref 0.61–1.24)
Glucose, Bld: 169 mg/dL — ABNORMAL HIGH (ref 70–99)
HCT: 43 % (ref 39.0–52.0)
Hemoglobin: 14.6 g/dL (ref 13.0–17.0)
Potassium: 3.7 mmol/L (ref 3.5–5.1)
Sodium: 138 mmol/L (ref 135–145)
TCO2: 28 mmol/L (ref 22–32)

## 2019-11-01 LAB — GLUCOSE, CAPILLARY: Glucose-Capillary: 157 mg/dL — ABNORMAL HIGH (ref 70–99)

## 2019-11-01 SURGERY — CARDIOVERSION
Anesthesia: General

## 2019-11-01 MED ORDER — LIDOCAINE 2% (20 MG/ML) 5 ML SYRINGE
INTRAMUSCULAR | Status: DC | PRN
  Administered 2019-11-01: 60 mg via INTRAVENOUS

## 2019-11-01 MED ORDER — SODIUM CHLORIDE 0.9 % IV SOLN: INTRAVENOUS | Status: DC

## 2019-11-01 MED ORDER — PROPOFOL 10 MG/ML IV BOLUS
INTRAVENOUS | Status: DC | PRN
  Administered 2019-11-01: 40 mg via INTRAVENOUS
  Administered 2019-11-01: 30 mg via INTRAVENOUS

## 2019-11-01 NOTE — CV Procedure (Signed)
Upon review of his EKG post DCCV he appears to be in NSR with frequent PACs. Will continue current management and have him follow with Dr. Percival Spanish.   [name] T. [name], [place]  [address] [place], [place] [address] [phone]  9:01 AM

## 2019-11-01 NOTE — Anesthesia Preprocedure Evaluation (Addendum)
Anesthesia Evaluation  Patient identified by MRN, date of birth, ID band Patient awake    Reviewed: Allergy & Precautions, NPO status , Patient's Chart, lab work & pertinent test results  History of Anesthesia Complications Negative for: history of anesthetic complications  Airway Mallampati: III  TM Distance: >3 FB Neck ROM: Full    Dental  (+) Edentulous Upper, Edentulous Lower   Pulmonary former smoker,    Pulmonary exam normal        Cardiovascular hypertension, Pt. on home beta blockers and Pt. on medications + CAD, + Cardiac Stents, + CABG, + Peripheral Vascular Disease and +CHF  + dysrhythmias Atrial Fibrillation + Valvular Problems/Murmurs  Rhythm:Irregular Rate:Normal   '20 TTE - EF 60 to 65%. Severely increased left ventricular hypertrophy. Elevated mean left atrial pressure. Pseudonormalization pattern of LV diastolic filling. LA was mildly dilated. Mild MR. Mild AS. Aortic valve mean gradient measures 16.0 mmHg. Aortic valve peak gradient measures 25.5 mmHg. Trivial PR.  '20 Carotid US - Left ICAS 60-79%, right ICAS 1-39%    Neuro/Psych negative neurological ROS  negative psych ROS   GI/Hepatic Neg liver ROS, PUD, GERD  Medicated and Controlled,  Endo/Other  diabetes, Type 2, Oral Hypoglycemic Agents  Renal/GU CRFRenal disease    Prostate cancer     Musculoskeletal  (+) Arthritis ,   Abdominal   Peds  Hematology  Plt 148k    Anesthesia Other Findings Covid neg 1/11  Reproductive/Obstetrics                            Anesthesia Physical Anesthesia Plan  ASA: III  Anesthesia Plan: General   Post-op Pain Management:    Induction: Intravenous  PONV Risk Score and Plan: 2 and Treatment may vary due to age or medical condition and Propofol infusion  Airway Management Planned: Mask and Natural Airway  Additional Equipment: None  Intra-op Plan:   Post-operative  Plan:   Informed Consent: I have reviewed the patients History and Physical, chart, labs and discussed the procedure including the risks, benefits and alternatives for the proposed anesthesia with the patient or authorized representative who has indicated his/her understanding and acceptance.       Plan Discussed with: CRNA and Anesthesiologist  Anesthesia Plan Comments:       Anesthesia Quick Evaluation

## 2019-11-01 NOTE — Interval H&P Note (Signed)
History and Physical Interval Note:  11/01/2019 8:12 AM  [name]  has presented today for surgery, with the diagnosis of AFIB.  The various methods of treatment have been discussed with the patient and family. After consideration of risks, benefits and other options for treatment, the patient has consented to  Procedure(s): CARDIOVERSION (N/A) as a surgical intervention.  The patient's history has been reviewed, patient examined, no change in status, stable for surgery.  I have reviewed the patient's chart and labs.  Questions were answered to the patient's satisfaction.    I confirmed with Mrs. Lehigh that Mr. Stepter has been on eliquis since 07/2019 and has not missed any doses in the last 3 weeks. Due to age and Cr >1.5, he is on eliquis 2.5 mg BID. This is appropriate.   [place] T. [name], [place]  [address] [place],  [address] [phone]  8:12 AM

## 2019-11-01 NOTE — CV Procedure (Addendum)
   DIRECT CURRENT CARDIOVERSION  NAME:  [name]    MRN: [id number] DOB:  Apr 17, 1933    ADMIT DATE: 11/01/2019  Indication:  Symptomatic atrial fibrillation  Procedure Note:  The patient signed informed consent.  They have had had therapeutic anticoagulation with eliquis greater than 3 weeks.  Anesthesia was administered by Dr. Fransisco Beau.  Adequate airway was maintained throughout and vital followed per protocol.  They were cardioverted x 2 with 200J of biphasic synchronized energy.  The patient did not covert to NSR despite 2 attempts at 200 J. However, on post procedure EKG he appears to be in NSR with PACs. There were no apparent complications.  The patient had normal neuro status and respiratory status post procedure with vitals stable as recorded elsewhere.    Follow up: They will continue on current medical therapy and follow up with cardiology as scheduled.  [name], [place]  [address] [place], [place] [address] [phone]  8:55 AM

## 2019-11-01 NOTE — Anesthesia Postprocedure Evaluation (Signed)
Anesthesia Post Note  Patient: Geraldo Haris Bezold  Procedure(s) Performed: CARDIOVERSION (N/A )     Patient location during evaluation: PACU Anesthesia Type: General Level of consciousness: awake and alert Pain management: pain level controlled Vital Signs Assessment: post-procedure vital signs reviewed and stable Respiratory status: spontaneous breathing, nonlabored ventilation and respiratory function stable Cardiovascular status: blood pressure returned to baseline and stable Postop Assessment: no apparent nausea or vomiting Anesthetic complications: no    Last Vitals:  Vitals:   11/01/19 0900 11/01/19 0910  BP: 133/66 (!) 145/67  Pulse: 65 63  Resp: 19 16  Temp:    SpO2: 100% 100%    Last Pain:  Vitals:   11/01/19 0855  TempSrc: Temporal  PainSc: 0-No pain                 Audry Pili

## 2019-11-01 NOTE — Transfer of Care (Signed)
Immediate Anesthesia Transfer of Care Note  Patient: [name]  Procedure(s) Performed: CARDIOVERSION (N/A )  Patient Location: Endoscopy Unit  Anesthesia Type:General  Level of Consciousness: awake and alert   Airway & Oxygen Therapy: Patient Spontanous Breathing  Post-op Assessment: Report given to RN and Post -op Vital signs reviewed and stable  Post vital signs: Reviewed and stable  Last Vitals:  Vitals Value Taken Time  BP    Temp    Pulse    Resp    SpO2      Last Pain:  Vitals:   11/01/19 0815  TempSrc: Oral  PainSc: 0-No pain         Complications: No apparent anesthesia complications

## 2019-11-28 ENCOUNTER — Telehealth: Payer: Self-pay | Admitting: Family Medicine

## 2019-11-28 ENCOUNTER — Other Ambulatory Visit: Payer: Self-pay

## 2019-11-28 MED ORDER — APIXABAN 2.5 MG PO TABS: 2.5000 mg | ORAL_TABLET | Freq: Two times a day (BID) | ORAL | 1 refills | Status: DC

## 2019-11-28 NOTE — Telephone Encounter (Signed)
Pt wife wants to know if she get the medication Eliquis refilled. The provider that prescribed it put 0 refills. She would like a call back.   Patient Phone: [phone]

## 2019-11-28 NOTE — Telephone Encounter (Signed)
Refill for 6 months. 

## 2019-11-28 NOTE — Telephone Encounter (Signed)
Called patient and LMOVM to return call  Left a detailed voice message and let them know that the refill requested has been sent to the pharmacy.

## 2019-11-28 NOTE — Telephone Encounter (Signed)
Please see message. Last filled from Ivor Costa, MD  Does patient need to get from Cardiology? Or OK for you to fill?  Please advise.

## 2019-11-29 ENCOUNTER — Other Ambulatory Visit: Payer: Self-pay | Admitting: Cardiology

## 2019-11-29 NOTE — Telephone Encounter (Signed)
Rx has been sent to the pharmacy electronically. ° °

## 2019-12-03 ENCOUNTER — Ambulatory Visit: Payer: Medicare Other | Admitting: Adult Health

## 2019-12-19 ENCOUNTER — Other Ambulatory Visit: Payer: Self-pay | Admitting: Family Medicine

## 2019-12-26 DIAGNOSIS — I5022 Chronic systolic (congestive) heart failure: Secondary | ICD-10-CM | POA: Insufficient documentation

## 2019-12-26 NOTE — Progress Notes (Signed)
Cardiology Office Note   Date:  12/27/2019   ID:  [name], DOB [birth year], MRN [id number]  PCP:  Shawn Post, MD  Cardiologist:   Shawn Breeding, MD  Chief Complaint  Patient presents with  . Shortness of Breath      History of Present Illness: [name] is a 86 y.o. male who presents for followup of CAD.   He had chest pain and had a cardiac catheterization on 04/01/2015 which showed a 95% in-stent restenosis in SVG to OM, treated with 2.75 x 24 mm Synergy DES postdilated to 3.3 mm. He had patent LIMA to LAD, patent SVG to posterolateral, patent SVG to PDA.    Of note he did have atrial fibrillation with rate control on presentation but he converted spontaneously. He was not started on warfarin because he was taking aspirin and Plavix.   He was having SOB in Dec 2018.  On [place] he had a fixed inferior defect.  CPX testing suggested a mixed restrictive/obstructive pattern of PFTs.  He was not thought to have a cardiovascular limitation.  He has been in atrial fib.  I had him wear a Holter and he had 100% atrial fib on this monitor.  He had a cardioversion since I last saw him.    He thought that he did well following this gradually over about a week or so.  He thought his breathing got better.  Then it gradually got worse again.  None of this was sudden in onset.  He has not had any weight gain or swelling.  He is not describing PND or orthopnea.  His breathlessness is subjective.  We followed up multiple times in the office and walking around the room he is short of breath his oxygen saturations are okay.  He is not describing any new chest discomfort, neck or arm discomfort.  His biggest issue is fatigue.   Past Medical History:  Diagnosis Date  . Arthritis   . CAD (coronary artery disease)    a. Cath September 2015 LIMA to the LAD patent, SVG to PDA patent, SVG to posterior lateral patent, SVG to OM with a 90% in-stent restenosis at an anastomotic lesion.  This was treated with angioplasty. b. cath 04/01/2015 95% ISR in SVG to OM treated with 2.75x24 Synergy DES postdilated to 3.48mm, all other grafts patent  . Cataract    surgery,B/L  . CHF (congestive heart failure) ([name])   . Chronic kidney disease    nephrolithiasis  . Diabetes mellitus    TYPE 2  . GERD (gastroesophageal reflux disease)   . Heart murmur   . Hernia   . Hypercholesterolemia   . Hypertension   . Incontinence    hx  over 1 year,leaks without awareness  . Other and unspecified diseases of appendix   . PAF (paroxysmal atrial fibrillation) ([name])    in the setting of ischemia on 03/31/2015  . Pancreatitis   . Prostate CA ([name]) 03/01/04   prostate bx=Adenocarcinoam,gleason 3+4=7,PSA=6.75  . Shortness of breath dyspnea   . Ulcer    hx gastric    Past Surgical History:  Procedure Laterality Date  . APPENDECTOMY    . BACK SURGERY    . CARDIAC CATHETERIZATION N/A 04/01/2015   Procedure: Left Heart Cath and Cors/Grafts Angiography;  Surgeon: Shawn Booze, MD;  Location: [facility];  Service: Cardiovascular;  Laterality: N/A;  . CARDIAC CATHETERIZATION N/A 04/01/2015   Procedure: Coronary Stent Intervention;  Surgeon: Shawn Booze, MD;  Location: [facility] CV [facility];  Service: Cardiovascular;  Laterality: N/A;  . CARDIOVERSION N/A 11/01/2019   Procedure: CARDIOVERSION;  Surgeon: Shawn Rile, MD;  Location: [facility];  Service: Cardiovascular;  Laterality: N/A;  . CARPAL TUNNEL RELEASE  2004   both hands  . CORONARY ARTERY BYPASS GRAFT    . CYSTOSCOPY  08/23/12   incomplete emoptying bladder  . HERNIA REPAIR    . incision and drainage of right chest abscess    . LAPAROSCOPIC CHOLECYSTECTOMY  09/2009  . LEFT HEART CATHETERIZATION WITH CORONARY ANGIOGRAM N/A 07/19/2014   Procedure: LEFT HEART CATHETERIZATION WITH CORONARY ANGIOGRAM;  Surgeon: Shawn Man, MD;  Location: [facility] CATH LAB;  Service: Cardiovascular;  Laterality: N/A;  . RECTAL  SURGERY    . ROBOT ASSISTED LAPAROSCOPIC RADICAL PROSTATECTOMY  2005     Current Outpatient Medications  Medication Sig Dispense Refill  . acetaminophen (TYLENOL) 325 MG tablet Take 2 tablets (650 mg total) by mouth every 6 (six) hours as needed for mild pain, fever or headache. 60 tablet 1  . albuterol (PROVENTIL) (2.5 MG/3ML) 0.083% nebulizer solution Take 3 mLs (2.5 mg total) by nebulization every 4 (four) hours as needed for wheezing or shortness of breath. 75 mL 3  . amLODipine (NORVASC) 10 MG tablet TAKE 1 TABLET BY MOUTH EVERY DAY 90 tablet 0  . amLODipine (NORVASC) 5 MG tablet Take 1 tablet (5 mg total) by mouth daily. 30 tablet 3  . apixaban (ELIQUIS) 2.5 MG TABS tablet Take 1 tablet (2.5 mg total) by mouth 2 (two) times daily. 180 tablet 1  . carvedilol (COREG) 6.25 MG tablet TAKE 1 TABLET (6.25 MG TOTAL) BY MOUTH 2 (TWO) TIMES DAILY WITH A MEAL. 180 tablet 3  . clopidogrel (PLAVIX) 75 MG tablet Take 1 tablet (75 mg total) by mouth daily. TAKE 1 TABLET BY MOUTH EVERY DAY (Patient taking differently: Take 75 mg by mouth daily. ) 90 tablet 3  . docusate sodium (STOOL SOFTENER) 100 MG capsule Take 1 capsule (100 mg total) by mouth 2 (two) times daily as needed for mild constipation. (Patient taking differently: Take 100 mg by mouth at bedtime. ) 30 capsule 0  . furosemide (LASIX) 40 MG tablet TAKE 1 TABLET BY MOUTH EVERY DAY (Patient taking differently: Take 40 mg by mouth 2 (two) times daily. ) 90 tablet 2  . glucose blood (FREESTYLE LITE) test strip 1 each by Other route as needed. Use as instructed     . Lancets (FREESTYLE) lancets 1 each by Other route as needed. Use as instructed     . Multiple Vitamins-Minerals (ICAPS AREDS 2 PO) Take 1 capsule by mouth daily.    . nitroGLYCERIN (NITROSTAT) 0.4 MG SL tablet Place 1 tablet (0.4 mg total) under the tongue every 5 (five) minutes x 3 doses as needed for chest pain (if no relief after 3rd dose, proceed to the ED for an evaluation). 75  tablet 1  . ondansetron (ZOFRAN) 4 MG tablet Take 1 tablet (4 mg total) by mouth every 6 (six) hours as needed for nausea. 20 tablet 0  . pantoprazole (PROTONIX) 40 MG tablet TAKE 1 TABLET BY MOUTH EVERY DAY (Patient taking differently: Take 40 mg by mouth daily. ) 90 tablet 1  . Polyethyl Glycol-Propyl Glycol (LUBRICANT EYE DROPS) 0.4-0.3 % SOLN Place 1-2 drops into both eyes 3 (three) times daily as needed (dry/irritated eyes.).    Marland Kitchen rosuvastatin (CRESTOR) 20 MG tablet TAKE 1  TABLET BY MOUTH EVERY DAY (Patient taking differently: Take 20 mg by mouth daily. ) 90 tablet 1  . sitaGLIPtin (JANUVIA) 50 MG tablet Take 1 tablet (50 mg total) by mouth daily. 30 tablet 3  . spironolactone (ALDACTONE) 25 MG tablet Take 1 tablet (25 mg total) by mouth daily. 90 tablet 2  . traMADol (ULTRAM) 50 MG tablet Take 1 tablet (50 mg total) by mouth every 6 (six) hours as needed. 1-2 every 6 h prn pain (Patient taking differently: Take 50-100 mg by mouth every 6 (six) hours as needed (pain.). 1-2 every 6 h prn pain) 120 tablet 5   No current facility-administered medications for this visit.    Allergies:   Codeine and Morphine    ROS:  Please see the history of present illness.   Otherwise, review of systems are positive for none.   All other systems are reviewed and negative.    PHYSICAL EXAM: VS:  BP 135/60   Pulse 73   Temp 97.7 F (36.5 C)   Ht 5\' 9"  (1.753 m)   Wt 183 lb 9.6 oz (83.3 kg)   SpO2 97%   BMI 27.11 kg/m  , BMI Body mass index is 27.11 kg/m. GENERAL:  Well appearing NECK:  No jugular venous distention, waveform within normal limits, carotid upstroke brisk and symmetric, no bruits, no thyromegaly LUNGS:  Clear to auscultation bilaterally CHEST:  Unremarkable HEART:  PMI not displaced or sustained,S1 and S2 within normal limits, no S3,, no clicks, no rubs, 2 out of 6 apical systolic murmur nonradiating, no diastolic murmurs, irregular ABD:  Flat, positive bowel sounds normal in frequency  in pitch, no bruits, no rebound, no guarding, no midline pulsatile mass, no hepatomegaly, no splenomegaly EXT:  2 plus pulses throughout, no edema, no cyanosis no clubbing   EKG:  EKG is  ordered today. The ekg ordered today demonstrates atrial fibrillation, rate 76, left bundle branch block   Recent Labs: 03/14/2019: ALT 16 07/26/2019: B Natriuretic Peptide 441.0 07/28/2019: TSH 6.832 10/25/2019: Platelets 148 11/01/2019: BUN 25; Creatinine, Ser 1.90; Hemoglobin 14.6; Potassium 3.7; Sodium 138    Lipid Panel    Component Value Date/Time   CHOL 117 03/14/2019 0955   TRIG 182.0 (H) 03/14/2019 0955   TRIG 306 (HH) 09/27/2006 1154   HDL 34.60 (L) 03/14/2019 0955   CHOLHDL 3 03/14/2019 0955   VLDL 36.4 03/14/2019 0955   LDLCALC 46 03/14/2019 0955   LDLDIRECT 143.3 11/23/2007 0000      Wt Readings from Last 3 Encounters:  12/27/19 183 lb 9.6 oz (83.3 kg)  11/01/19 177 lb (80.3 kg)  10/25/19 185 lb 3.2 oz (84 kg)      Other studies Reviewed: Additional studies/ records that were reviewed today include: DCCV . Review of the above records demonstrates:  See above  ASSESSMENT AND PLAN:  CAD  The patient has no new sypmtoms.  No further cardiovascular testing is indicated.  We will continue with aggressive risk reduction and meds as listed.  There is no evidence that ischemia is contributing to any of his ongoing symptoms.   Atrial fib He is [name] DCCV.  There may have been improvement in his breathing after this.  It is hard for me to correlate the A. fib with his subjective complaints.  My plan is to send him to pulmonary first to see if there is any reversible pulmonary contributor to his dyspnea.  If not I would bring him back to consider Tikosyn and cardioversion.  Hypertension  At target.  No change in therapy.     AS:   This was mild on echo in October 2020.  No change in therapy.  Carotid stenosis-Bilateral  He has moderate left disease in June 2020.  I will make  sure he has follow-up.  CKD: Creatinine most recently was 1.72.  This is improved.   Chronic systolic and diastolic HF:  He seems to be euvolemic.  I will check a BNP.   Covid 19 education: He has received both of his vaccine shots.   Current medicines are reviewed at length with the patient today.  The patient does not have concerns regarding medicines.  The following changes have been made:  no change  Labs/ tests ordered today include:   Orders Placed This Encounter  Procedures  . Brain natriuretic peptide  . Ambulatory referral to Pulmonology  . EKG 12-Lead     Disposition:   FU with me after the pulmonary appt. get him in.     Signed, Shawn Breeding, MD  12/27/2019 11:24 AM    [facility]

## 2019-12-27 ENCOUNTER — Ambulatory Visit: Payer: Medicare PPO | Admitting: Cardiology

## 2019-12-27 ENCOUNTER — Encounter: Payer: Self-pay | Admitting: Cardiology

## 2019-12-27 ENCOUNTER — Other Ambulatory Visit: Payer: Self-pay

## 2019-12-27 DIAGNOSIS — I5022 Chronic systolic (congestive) heart failure: Secondary | ICD-10-CM

## 2019-12-27 DIAGNOSIS — I4819 Other persistent atrial fibrillation: Secondary | ICD-10-CM | POA: Diagnosis not present

## 2019-12-27 DIAGNOSIS — I10 Essential (primary) hypertension: Secondary | ICD-10-CM

## 2019-12-27 DIAGNOSIS — R0602 Shortness of breath: Secondary | ICD-10-CM | POA: Diagnosis not present

## 2019-12-27 DIAGNOSIS — I2510 Atherosclerotic heart disease of native coronary artery without angina pectoris: Secondary | ICD-10-CM | POA: Diagnosis not present

## 2019-12-27 DIAGNOSIS — I350 Nonrheumatic aortic (valve) stenosis: Secondary | ICD-10-CM | POA: Diagnosis not present

## 2019-12-27 DIAGNOSIS — Z7189 Other specified counseling: Secondary | ICD-10-CM | POA: Diagnosis not present

## 2019-12-27 DIAGNOSIS — I6523 Occlusion and stenosis of bilateral carotid arteries: Secondary | ICD-10-CM

## 2019-12-27 DIAGNOSIS — N1832 Chronic kidney disease, stage 3b: Secondary | ICD-10-CM | POA: Diagnosis not present

## 2019-12-27 NOTE — Patient Instructions (Signed)
Medication Instructions:  No changes *If you need a refill on your cardiac medications before your next appointment, please call your pharmacy*  Lab Work: Your physician recommends that you return for lab work today (BNP) If you have labs (blood work) drawn today and your tests are completely normal, you will receive your results only by: Marland Kitchen MyChart Message (if you have MyChart) OR . A paper copy in the mail If you have any lab test that is abnormal or we need to change your treatment, we will call you to review the results.  Testing/Procedures: Your physician has requested that you have a carotid duplex in June. This test is an ultrasound of the carotid arteries in your neck. It looks at blood flow through these arteries that supply the brain with blood. Allow one hour for this exam. There are no restrictions or special instructions.   Follow-Up: At [facility], you and your health needs are our priority.  As part of our continuing mission to provide you with exceptional heart care, we have created designated Provider Care Teams.  These Care Teams include your primary Cardiologist (physician) and Advanced Practice Providers (APPs -  Physician Assistants and Nurse Practitioners) who all work together to provide you with the care you need, when you need it.  We recommend signing up for the patient portal called "MyChart".  Sign up information is provided on this After Visit Summary.  MyChart is used to connect with patients for Virtual Visits (Telemedicine).  Patients are able to view lab/test results, encounter notes, upcoming appointments, etc.  Non-urgent messages can be sent to your provider as well.   To learn more about what you can do with MyChart, go to [email or web address].    Your next appointment:   6 week(s)  The format for your next appointment:   In Person  Provider:   [name], MD  Other Instructions Referral made to Pulmonary Care

## 2019-12-28 ENCOUNTER — Other Ambulatory Visit: Payer: Self-pay | Admitting: Family Medicine

## 2019-12-28 LAB — BRAIN NATRIURETIC PEPTIDE: BNP: 234.3 pg/mL — ABNORMAL HIGH (ref 0.0–100.0)

## 2020-01-08 ENCOUNTER — Other Ambulatory Visit: Payer: Self-pay

## 2020-01-08 ENCOUNTER — Ambulatory Visit: Payer: Medicare PPO | Admitting: Internal Medicine

## 2020-01-08 ENCOUNTER — Encounter: Payer: Self-pay | Admitting: Internal Medicine

## 2020-01-08 VITALS — BP 140/70 | HR 87 | Temp 98.1°F | Ht 69.0 in | Wt 180.0 lb

## 2020-01-08 DIAGNOSIS — R0602 Shortness of breath: Secondary | ICD-10-CM

## 2020-01-08 MED ORDER — LEVALBUTEROL HCL 0.63 MG/3ML IN NEBU: 0.6300 mg | INHALATION_SOLUTION | Freq: Four times a day (QID) | RESPIRATORY_TRACT | 5 refills | Status: DC | PRN

## 2020-01-08 MED ORDER — LEVALBUTEROL TARTRATE 45 MCG/ACT IN AERO: 2.0000 | INHALATION_SPRAY | RESPIRATORY_TRACT | 12 refills | Status: DC | PRN

## 2020-01-08 NOTE — Progress Notes (Signed)
[name]    [id number]    01/05/1933  Primary Care Physician:Burchette, Alinda Sierras, MD  Referring Physician: [name], MD [address] [place],  [place] [address] Reason for Consultation: dyspnea on exertion Date of Consultation: 01/08/2020  Chief complaint:   Chief Complaint  Patient presents with  . Consult    sob with exerting, awakes sob and sob with speaking.  dry cough at night, not bothersome to patient, awakes spouse.     HPI: Shortness of breath progressing over the last 2 years, progressing over the last 6 months.  Shortness of breath at rest and with exertion and talking. Breathing improves with rest. He has difficulty describing the pain - feels like a mix of burning tightness and pressure.  Now it is getting harder to talk.  History of CABG over 10 years ago, has had multiple stents place most recently in 2016.  Has had cardioversion for A. Fib as recently as Jan 2021 which did not work. He is on eliquis. He can sometimes feel when he is going in and out of A. Fib.   Hospitalized for heart failure exacerbation in October 2020 and treated for pneumonia/bronchitis as well.   Has been prescribed albuterol inhaler in the past and it does help his breathing but has no impact on his chest tightness and burning. He does have heart burn and has difficulty distinguishing heart burn and chest pain and this is also folded in with his trouble breathing. He does take pantoprazole.    He also has a neublizer machine and it helps for 1-2 hours.   No orthopnea or PND. Wife denies any witnessed apneas, he has no excessive daytime sleepiness.   Sitting here in the chair he is short of breath with a respiratory rate in the 20s and appears to be hyperventilating.  OBSTRUCTIVE SLEEP APNEA SCREENING  1.  Snoring?:  yes 2.  Tired?:  no 3.  Observed apnea, stop breathing or choking/gasping during sleep?:  no 4.  Pressure. HTN history?  yes 5.  BMI more than  35 kg/m2?  no 6.  Age more than 28 yrs?  yes 7.  Neck size larger than 17 in for male or 16 in for male?  no 8.  Gender = Male?  yes  Total:  4  For general population  OSA - Low Risk : Yes to 0 - 2 questions OSA - Intermediate Risk : Yes to 3 - 4 questions OSA - High Risk : Yes to 5 - 8 questions  or Yes to 2 or more of 4 STOP questions + male gender or Yes to 2 or more of 4 STOP questions + BMI > 35kg/m2  or Yes to 2 or more of 4 STOP questions + neck circumference 17 inches / 43cm in male or 16 inches / 41cm in male  References: Rinaldo Cloud al. Anesthesiology 2008; 108: 812-821,  Gabriel Cirri et al Br Dara Hoyer 2012; 108: 237-628,  Gabriel Cirri et al J Clin Sleep Med Sept 2014.   Social history: Exposures: lives at home with his wife of 24 years. Smoking history: very remote over 60 years ago.   Social History   Occupational History    Employer: RETIRED    Comment: Retired  Tobacco Use  . Smoking status: Former Smoker    Packs/day: 0.50    Years: 5.00    Pack years: 2.50    Types: Cigarettes  Quit date: 07/20/1972    Years since quitting: 47.5  . Smokeless tobacco: Never Used  . Tobacco comment: quit s  Substance and Sexual Activity  . Alcohol use: No  . Drug use: No    Comment: quit smoking 40 years ago  . Sexual activity: Never    Relevant family history:  Family History  Problem Relation Age of Onset  . Cancer Mother        stomach    Past Medical History:  Diagnosis Date  . Arthritis   . CAD (coronary artery disease)    a. Cath September 2015 LIMA to the LAD patent, SVG to PDA patent, SVG to posterior lateral patent, SVG to OM with a 90% in-stent restenosis at an anastomotic lesion. This was treated with angioplasty. b. cath 04/01/2015 95% ISR in SVG to OM treated with 2.75x24 Synergy DES postdilated to 3.12mm, all other grafts patent  . Cataract    surgery,B/L  . CHF (congestive heart failure) ([facility])   . Chronic kidney disease    nephrolithiasis  .  Diabetes mellitus    TYPE 2  . GERD (gastroesophageal reflux disease)   . Heart murmur   . Hernia   . Hypercholesterolemia   . Hypertension   . Incontinence    hx  over 1 year,leaks without awareness  . Other and unspecified diseases of appendix   . PAF (paroxysmal atrial fibrillation) ([place])    in the setting of ischemia on 03/31/2015  . Pancreatitis   . Prostate CA ([facility]) 03/01/04   prostate bx=Adenocarcinoam,gleason 3+4=7,PSA=6.75  . Shortness of breath dyspnea   . Ulcer    hx gastric    Past Surgical History:  Procedure Laterality Date  . APPENDECTOMY    . BACK SURGERY    . CARDIAC CATHETERIZATION N/A 04/01/2015   Procedure: Left Heart Cath and Cors/Grafts Angiography;  Surgeon: Jettie Booze, MD;  Location: [facility];  Service: Cardiovascular;  Laterality: N/A;  . CARDIAC CATHETERIZATION N/A 04/01/2015   Procedure: Coronary Stent Intervention;  Surgeon: Jettie Booze, MD;  Location: [facility];  Service: Cardiovascular;  Laterality: N/A;  . CARDIOVERSION N/A 11/01/2019   Procedure: CARDIOVERSION;  Surgeon: Geralynn Rile, MD;  Location: [facility];  Service: Cardiovascular;  Laterality: N/A;  . CARPAL TUNNEL RELEASE  2004   both hands  . CORONARY ARTERY BYPASS GRAFT    . CYSTOSCOPY  08/23/12   incomplete emoptying bladder  . HERNIA REPAIR    . incision and drainage of right chest abscess    . LAPAROSCOPIC CHOLECYSTECTOMY  09/2009  . LEFT HEART CATHETERIZATION WITH CORONARY ANGIOGRAM N/A 07/19/2014   Procedure: LEFT HEART CATHETERIZATION WITH CORONARY ANGIOGRAM;  Surgeon: Leonie Man, MD;  [facility];  Service: Cardiovascular;  Laterality: N/A;  . RECTAL SURGERY    . ROBOT ASSISTED LAPAROSCOPIC RADICAL PROSTATECTOMY  2005     Review of systems: Review of Systems  Constitutional: Negative for chills, fever and weight loss.  HENT: Negative for congestion, sinus pain and sore throat.   Eyes: Negative for discharge and  redness.  Respiratory: Positive for cough and shortness of breath. Negative for hemoptysis, sputum production and wheezing.   Cardiovascular: Positive for chest pain and palpitations. Negative for orthopnea, leg swelling and PND.  Gastrointestinal: Negative for heartburn, nausea and vomiting.  Musculoskeletal: Negative for joint pain and myalgias.  Skin: Negative for rash.  Neurological: Negative for dizziness, tremors, focal weakness and headaches.  Endo/Heme/Allergies: Negative for  environmental allergies.  Psychiatric/Behavioral: Negative for depression. The patient is not nervous/anxious.   All other systems reviewed and are negative.   Physical Exam: Blood pressure 140/70, pulse 87, temperature 98.1 F (36.7 C), temperature source Temporal, height 5\' 9"  (1.753 m), weight 180 lb (81.6 kg), SpO2 98 %. Gen:      No acute distress, he is SOB with RR in the 20s at rest.  ENT:  no nasal polyps, mucus membranes moist Lungs:    No increased respiratory effort, symmetric chest wall excursion, clear to auscultation bilaterally, no wheezes or crackles CV:         Regular rate and rhythm; no murmurs, rubs, or gallops.  No pedal edema Abd:      + bowel sounds; soft, non-tender; no distension MSK: no acute synovitis of DIP or PIP joints, no mechanics hands.  Skin:      Warm and dry; no rashes Neuro: HOH Psych: alert and oriented x3, normal mood and affect   Data Reviewed/Medical Decision Making:  Independent interpretation of tests: Imaging: . Review of patient's chest xray images 07/2019 demonstrated mild cardiomegaly, sternotomy wires, no pleural effusion chest xray. The patient's images have been independently reviewed by me.    PFTs: CPET personally reviewed - FEV1/FVC 52% of predicted. No post bronchodilator was performed. There was an increase in VD/VT deadspace ventilation. The effort was submaximal O2 pulse did increase appropriately to 117% of predicted.  There was no hypoxemia  or desaturation. Noted difficulty performing MVV maneuver.   Echo 07/2019 1. Left ventricular ejection fraction, by visual estimation, is 60 to  65%. The left ventricle has normal function. Normal left ventricular size.  There is severely increased left ventricular hypertrophy.  2. Elevated mean left atrial pressure.  3. Left ventricular diastolic Doppler parameters are consistent with  pseudonormalization pattern of LV diastolic filling.  4. Global right ventricle has normal systolic function.The right  ventricular size is normal. No increase in right ventricular wall  thickness.  5. Left atrial size was mildly dilated.  6. Right atrial size was normal.  7. Mild mitral annular calcification.  8. The mitral valve is normal in structure. Mild mitral valve  regurgitation. No evidence of mitral stenosis.  9. The tricuspid valve is normal in structure. Tricuspid valve  regurgitation was not visualized by color flow Doppler.  10. Aortic valve mean gradient measures 16.0 mmHg.  11. Aortic valve peak gradient measures 25.5 mmHg.  12. The aortic valve is tricuspid Aortic valve regurgitation was not  visualized by color flow Doppler. Mild aortic valve stenosis.  13. There is Moderate calcification of the aortic valve.  14. There is Moderate thickening of the aortic valve.  15. The pulmonic valve was normal in structure. Pulmonic valve  regurgitation is trivial by color flow Doppler.  16. Normal pulmonary artery systolic pressure.  17. The inferior vena cava is normal in size with greater than 50%  respiratory variability, suggesting right atrial pressure of 3 mmHg.   Labs:  Lab Results  Component Value Date   WBC 10.3 10/25/2019   HGB 14.6 11/01/2019   HCT 43.0 11/01/2019   MCV 91 10/25/2019   PLT 148 (L) 10/25/2019   Lab Results  Component Value Date   NA 138 11/01/2019   K 3.7 11/01/2019   CL 100 11/01/2019   CO2 24 10/25/2019     Immunization status:  Immunization  History  Administered Date(s) Administered  . Influenza Split 10/04/2011  . Influenza Whole 07/16/2009  .  Influenza, High Dose Seasonal PF 06/18/2016, 09/13/2018, 07/19/2019  . Influenza,inj,Quad PF,6+ Mos 08/13/2014  . Influenza-Unspecified 08/13/2017  . PFIZER SARS-COV-2 Vaccination 11/24/2019, 12/15/2019  . Pneumococcal Conjugate-13 05/31/2008, 06/03/2014  . Pneumococcal Polysaccharide-23 11/17/2006  . Td 11/23/2007, 10/18/2012  . Zoster 10/18/2013    . I reviewed prior external note(s) from cardiology . I reviewed the result(s) of the labs and imaging as noted above.  . I have ordered pulmonary function testing  Assessment:  Dyspnea CAD s/p CABG GERD Atrial Fibrillation - rate controlled on Eliquis.   Plan/Recommendations:  Mr. Vandevelde's dyspnea is chronic at both rest and exertion. He has a CPET which suggests a primary ventilatory defect in his breathing.  I suspect chest wall restriction.  He does appear to be very sob simply at rest today in his chair.  Differential includes restrictive lung disease, neuromuscular or diaphgragmatic weakness. He does not have any resting hypercapniea on his recent chemistries.Serum CO2<24 consistently. He also did not have any hypoxemia or desaturation during CPET. He does have airflow limitation on his spirometry from CPET so obstructive airways disease is on the differential. Will do a trial of xopanex inhaler and nebulizer solution since he has had benefit from this in the past - will need alternative to albuterol due to paroxysmal and symptomatic A. Fib. I think first we obtain some PFTs to confirm the restriction suggested on his CPET.   He is at risk for OSA but denies excessive daytime sleepiness. Can consider overnight oximetry and PSG in the future.    Return to Care: Return in about 10 weeks (around 03/18/2020).  Lenice Llamas, MD [facility]  CC: [name],  MD

## 2020-01-08 NOTE — Patient Instructions (Signed)
The patient should have follow up scheduled with myself in 2-3 months.   Prior to next visit patient should have: Full set of PFTs: DLCO, lung volumes, spirometry with pre and post bronchodilator.   Take the Levalbuterol (Xopanex) instead of albuterol now because this is better for heart rate.   Take the Levalbuterol rescue inhaler every 4 to 6 hours as needed for wheezing or shortness of breath. You can also take it 15 minutes before exercise or exertional activity. Side effects include heart racing or pounding, jitters or anxiety. If you have a history of an irregular heart rhythm, it can make this worse. Can also give some patients a hard time sleeping.  To inhale the aerosol using an inhaler, follow these steps:  1. Remove the protective dust cap from the end of the mouthpiece. If the dust cap was not placed on the mouthpiece, check the mouthpiece for dirt or other objects. Be sure that the canister is fully and firmly inserted in the mouthpiece. 2. If you are using the inhaler for the first time or if you have not used the inhaler in more than 14 days, you will need to prime it. You may also need to prime the inhaler if it has been dropped. Ask your pharmacist or check the manufacturer's information if this happens. To prime the inhaler, shake it well and then press down on the canister 4 times to release 4 sprays into the air, away from your face. Be careful not to get albuterol in your eyes. 3. Shake the inhaler well. 4. Breathe out as completely as possible through your mouth. 4. Hold the canister with the mouthpiece on the bottom, facing you and the canister pointing upward. Place the open end of the mouthpiece into your mouth. Close your lips tightly around the mouthpiece. 6. Breathe in slowly and deeply through the mouthpiece.At the same time, press down once on the container to spray the medication into your mouth. 7. Try to hold your breath for 10 seconds. remove the inhaler, and  breathe out slowly. 8. If you were told to use 2 puffs, wait 1 minute and then repeat steps 3-7. 9. Replace the protective cap on the inhaler. 10. Clean your inhaler regularly. Follow the manufacturer's directions carefully and ask your doctor or pharmacist if you have any questions about cleaning your inhaler.  Check the back of the inhaler to keep track of the total number of doses left on the inhaler.

## 2020-01-11 ENCOUNTER — Other Ambulatory Visit: Payer: Self-pay

## 2020-01-14 ENCOUNTER — Encounter: Payer: Self-pay | Admitting: Family Medicine

## 2020-01-14 ENCOUNTER — Ambulatory Visit (INDEPENDENT_AMBULATORY_CARE_PROVIDER_SITE_OTHER): Payer: Medicare PPO | Admitting: Family Medicine

## 2020-01-14 VITALS — BP 128/82 | HR 66 | Temp 97.6°F | Wt 180.7 lb

## 2020-01-14 DIAGNOSIS — I4819 Other persistent atrial fibrillation: Secondary | ICD-10-CM | POA: Diagnosis not present

## 2020-01-14 DIAGNOSIS — R5383 Other fatigue: Secondary | ICD-10-CM

## 2020-01-14 DIAGNOSIS — E1121 Type 2 diabetes mellitus with diabetic nephropathy: Secondary | ICD-10-CM | POA: Diagnosis not present

## 2020-01-14 DIAGNOSIS — R7989 Other specified abnormal findings of blood chemistry: Secondary | ICD-10-CM | POA: Diagnosis not present

## 2020-01-14 DIAGNOSIS — N183 Chronic kidney disease, stage 3 unspecified: Secondary | ICD-10-CM | POA: Diagnosis not present

## 2020-01-14 LAB — BASIC METABOLIC PANEL
BUN: 24 mg/dL — ABNORMAL HIGH (ref 6–23)
CO2: 29 mEq/L (ref 19–32)
Calcium: 9.8 mg/dL (ref 8.4–10.5)
Chloride: 97 mEq/L (ref 96–112)
Creatinine, Ser: 1.98 mg/dL — ABNORMAL HIGH (ref 0.40–1.50)
GFR: 32.3 mL/min — ABNORMAL LOW (ref >60.00)
Glucose, Bld: 215 mg/dL — ABNORMAL HIGH (ref 70–99)
Potassium: 4.2 mEq/L (ref 3.5–5.1)
Sodium: 135 mEq/L (ref 135–145)

## 2020-01-14 LAB — LIPID PANEL
Cholesterol: 109 mg/dL (ref 0–200)
HDL: 26.5 mg/dL — ABNORMAL LOW (ref >39.00)
LDL Cholesterol: 56 mg/dL (ref 0–99)
NonHDL: 82.63
Total CHOL/HDL Ratio: 4
Triglycerides: 132 mg/dL (ref 0.0–149.0)
VLDL: 26.4 mg/dL (ref 0.0–40.0)

## 2020-01-14 LAB — POCT GLYCOSYLATED HEMOGLOBIN (HGB A1C): Hemoglobin A1C: 7.7 % — AB (ref 4.0–5.6)

## 2020-01-14 LAB — T4, FREE: Free T4: 0.79 ng/dL (ref 0.60–1.60)

## 2020-01-14 LAB — VITAMIN B12: Vitamin B-12: 241 pg/mL (ref 211–911)

## 2020-01-14 LAB — TSH: TSH: 3.82 u[IU]/mL (ref 0.35–4.50)

## 2020-01-14 NOTE — Progress Notes (Signed)
Subjective:     Patient ID: [name], male   DOB: 01-15-1933, 86 y.o.   MRN: [id number]  HPI  Mr. Callicott has history of chronic diastolic heart failure, CAD, systolic heart failure, hypertension, persistent atrial fibrillation, type 2 diabetes, history of GERD, rosacea, chronic kidney disease, dyslipidemia. He has been struggling with some chronic dyspnea and has seen both cardiology and pulmonary. He had recent cardioversion procedure and felt better for about a week when he was in sinus rhythm but he felt poorly after going back in A. fib. He has seen pulmonary and recently was placed on Xopenex. He complains of increased fatigue. He does have some occasional paresthesias involving his distal extremities. He had elevated TSH of 6 range back in the fall with free T4 that was slightly above normal range.  He does take chronic PPI. No history of B12 levels. No recent dietary changes. Appetite and weight are stable.  He remains on Januvia 50 mg daily for his diabetes. He states he been drinking lemonade several times daily over the past few months. Not monitoring blood sugars regularly. No significant polyuria or polydipsia.  Past Medical History:  Diagnosis Date  . Arthritis   . CAD (coronary artery disease)    a. Cath September 2015 LIMA to the LAD patent, SVG to PDA patent, SVG to posterior lateral patent, SVG to OM with a 90% in-stent restenosis at an anastomotic lesion. This was treated with angioplasty. b. cath 04/01/2015 95% ISR in SVG to OM treated with 2.75x24 Synergy DES postdilated to 3.59mm, all other grafts patent  . Cataract    surgery,B/L  . CHF (congestive heart failure) ([place])   . Chronic kidney disease    nephrolithiasis  . Diabetes mellitus    TYPE 2  . GERD (gastroesophageal reflux disease)   . Heart murmur   . Hernia   . Hypercholesterolemia   . Hypertension   . Incontinence    hx  over 1 year,leaks without awareness  . Other and unspecified diseases of appendix   .  PAF (paroxysmal atrial fibrillation) ([place])    in the setting of ischemia on 03/31/2015  . Pancreatitis   . Prostate CA ([place]) 03/01/04   prostate bx=Adenocarcinoam,gleason 3+4=7,PSA=6.75  . Shortness of breath dyspnea   . Ulcer    hx gastric   Past Surgical History:  Procedure Laterality Date  . APPENDECTOMY    . BACK SURGERY    . CARDIAC CATHETERIZATION N/A 04/01/2015   Procedure: Left Heart Cath and Cors/Grafts Angiography;  Surgeon: Jettie Booze, MD;  Location: [facility];  Service: Cardiovascular;  Laterality: N/A;  . CARDIAC CATHETERIZATION N/A 04/01/2015   Procedure: Coronary Stent Intervention;  Surgeon: Jettie Booze, MD;  Location: [facility];  Service: Cardiovascular;  Laterality: N/A;  . CARDIOVERSION N/A 11/01/2019   Procedure: CARDIOVERSION;  Surgeon: Geralynn Rile, MD;  Location: [facility];  Service: Cardiovascular;  Laterality: N/A;  . CARPAL TUNNEL RELEASE  2004   both hands  . CORONARY ARTERY BYPASS GRAFT    . CYSTOSCOPY  08/23/12   incomplete emoptying bladder  . HERNIA REPAIR    . incision and drainage of right chest abscess    . LAPAROSCOPIC CHOLECYSTECTOMY  09/2009  . LEFT HEART CATHETERIZATION WITH CORONARY ANGIOGRAM N/A 07/19/2014   Procedure: LEFT HEART CATHETERIZATION WITH CORONARY ANGIOGRAM;  Surgeon: Leonie Man, MD;  Location: [facility];  Service: Cardiovascular;  Laterality: N/A;  . RECTAL SURGERY    .  ROBOT ASSISTED LAPAROSCOPIC RADICAL PROSTATECTOMY  2005    reports that he quit smoking about 47 years ago. His smoking use included cigarettes. He has a 2.50 pack-year smoking history. He has never used smokeless tobacco. He reports that he does not drink alcohol or use drugs. family history includes Cancer in his mother. Allergies  Allergen Reactions  . Codeine   . Morphine     REACTION: does not like   Wt Readings from Last 3 Encounters:  01/14/20 180 lb 11.2 oz (82 kg)  01/08/20 180 lb (81.6 kg)  12/27/19  183 lb 9.6 oz (83.3 kg)    Review of Systems  Constitutional: Positive for fatigue. Negative for appetite change, chills, fever and unexpected weight change.  Respiratory: Positive for shortness of breath. Negative for cough and wheezing.   Cardiovascular: Negative for chest pain, palpitations and leg swelling.  Gastrointestinal: Negative for abdominal pain.  Genitourinary: Negative for dysuria.  Neurological: Negative for dizziness and syncope.  Psychiatric/Behavioral: Negative for confusion.       Objective:   Physical Exam Vitals reviewed.  Constitutional:      Appearance: Normal appearance.  Cardiovascular:     Comments: Irregular rhythm but rate controlled Pulmonary:     Comments: Slightly diminished breath sounds throughout. No wheezes Musculoskeletal:     Right lower leg: No edema.     Left lower leg: No edema.  Neurological:     General: No focal deficit present.     Mental Status: He is alert.     Cranial Nerves: No cranial nerve deficit.     Motor: No weakness.        Assessment:     #1 type 2 diabetes. Worsening control with A1c today 7.7%. Poor dietary compliance with increased lemonade consumption several times daily over the past several months which is certainly likely contributing  #2 hypertension stable and at goal  #3 chronic atrial fibrillation currently rate controlled and maintained on Eliquis  #4 fatigue. This has been chronic and unchanged. Rule out hypothyroidism. Rule out B12 deficiency-especially in view of his chronic PPI use and recent paresthesias extremities    Plan:     -Check labs with basic metabolic panel, lipid panel, TSH, free T4, B12 level  -Long discussion regarding his diabetes. He would rather try to make some dietary correction with elimination of lemonade and other sugar-containing beverages before adding insulin. Will reassess in 3 months. If A1c not improved at that time consider options such as long-acting insulin  -He is  encouraged to continue close follow-up with cardiology and pulmonary  [name] MD [facility]

## 2020-01-14 NOTE — Patient Instructions (Signed)
Reduce or eliminate the lemonade and let's see how much we can get the A1C back down.

## 2020-01-17 DIAGNOSIS — C61 Malignant neoplasm of prostate: Secondary | ICD-10-CM | POA: Diagnosis not present

## 2020-01-17 LAB — PSA: PSA: 7.37

## 2020-01-18 ENCOUNTER — Other Ambulatory Visit: Payer: Self-pay | Admitting: Family Medicine

## 2020-01-23 DIAGNOSIS — C61 Malignant neoplasm of prostate: Secondary | ICD-10-CM | POA: Diagnosis not present

## 2020-02-04 ENCOUNTER — Encounter: Payer: Self-pay | Admitting: Family Medicine

## 2020-02-06 NOTE — Progress Notes (Signed)
Cardiology Office Note   Date:  02/07/2020   ID:  [name], DOB [birth year], MRN [id number]  PCP:  [name], MD  Cardiologist:   [name], MD  Chief Complaint  Patient presents with  . Shortness of Breath      History of Present Illness: [name] is a 86 y.o. male who presents for followup of CAD.   He had chest pain and had a cardiac catheterization on 04/01/2015 which showed a 95% in-stent restenosis in SVG to OM, treated with 2.75 x 24 mm Synergy DES postdilated to 3.3 mm. He had patent LIMA to LAD, patent SVG to posterolateral, patent SVG to PDA.    Of note he did have atrial fibrillation with rate control on presentation but he converted spontaneously. He was not started on warfarin because he was taking aspirin and Plavix.   He was having SOB in Dec 2018.  On [place] he had a fixed inferior defect.  CPX testing suggested a mixed restrictive/obstructive pattern of PFTs.  He was not thought to have a cardiovascular limitation.  He has been in atrial fib.  I had him wear a Holter and he had 100% atrial fib on this monitor.  He had a cardioversion .  However, he was back in atrial fibrillation when I saw him last.  It is really not clear that his symptoms of dyspnea are related to the fibrillation.  I sent him to pulmonary and I reviewed their notes for this visit.  He is scheduled to have pulmonary function testing.  He was given bronchodilators which he says helps for a few minutes.  He thought he might have some restrictive component of his lung disease.  He did have a very mildly elevated BNP previously.  However, he also has renal insufficiency.  He is trying to watch his salt and fluid.  He does not really notice that he is in fibrillation.  He is not having any presyncope or syncope.  He is not having any chest pressure, neck or arm discomfort.  He has had no edema.  His biggest issue is fatigue.  However, he has felt a little better since starting  B12.  Past Medical History:  Diagnosis Date  . Arthritis   . CAD (coronary artery disease)    a. Cath September 2015 LIMA to the LAD patent, SVG to PDA patent, SVG to posterior lateral patent, SVG to OM with a 90% in-stent restenosis at an anastomotic lesion. This was treated with angioplasty. b. cath 04/01/2015 95% ISR in SVG to OM treated with 2.75x24 Synergy DES postdilated to 3.44mm, all other grafts patent  . Cataract    surgery,B/L  . CHF (congestive heart failure) ([place])   . Chronic kidney disease    nephrolithiasis  . Diabetes mellitus    TYPE 2  . GERD (gastroesophageal reflux disease)   . Heart murmur   . Hernia   . Hypercholesterolemia   . Hypertension   . Incontinence    hx  over 1 year,leaks without awareness  . Other and unspecified diseases of appendix   . PAF (paroxysmal atrial fibrillation) ([place])    in the setting of ischemia on 03/31/2015  . Pancreatitis   . Prostate CA ([place]) 03/01/04   prostate bx=Adenocarcinoam,gleason 3+4=7,PSA=6.75  . Shortness of breath dyspnea   . Ulcer    hx gastric    Past Surgical History:  Procedure Laterality Date  . APPENDECTOMY    . BACK  SURGERY    . CARDIAC CATHETERIZATION N/A 04/01/2015   Procedure: Left Heart Cath and Cors/Grafts Angiography;  Surgeon: Jettie Booze, MD;  Location: [place] [facility];  Service: Cardiovascular;  Laterality: N/A;  . CARDIAC CATHETERIZATION N/A 04/01/2015   Procedure: Coronary Stent Intervention;  Surgeon: Jettie Booze, MD;  Location: [place] [facility];  Service: Cardiovascular;  Laterality: N/A;  . CARDIOVERSION N/A 11/01/2019   Procedure: CARDIOVERSION;  Surgeon: Geralynn Rile, MD;  Location: [place];  Service: Cardiovascular;  Laterality: N/A;  . CARPAL TUNNEL RELEASE  2004   both hands  . CORONARY ARTERY BYPASS GRAFT    . CYSTOSCOPY  08/23/12   incomplete emoptying bladder  . HERNIA REPAIR    . incision and drainage of right chest abscess    . LAPAROSCOPIC  CHOLECYSTECTOMY  09/2009  . LEFT HEART CATHETERIZATION WITH CORONARY ANGIOGRAM N/A 07/19/2014   Procedure: LEFT HEART CATHETERIZATION WITH CORONARY ANGIOGRAM;  Surgeon: Leonie Man, MD;  Location[facility];  Service: Cardiovascular;  Laterality: N/A;  . RECTAL SURGERY    . ROBOT ASSISTED LAPAROSCOPIC RADICAL PROSTATECTOMY  2005     Current Outpatient Medications  Medication Sig Dispense Refill  . acetaminophen (TYLENOL) 325 MG tablet Take 2 tablets (650 mg total) by mouth every 6 (six) hours as needed for mild pain, fever or headache. 60 tablet 1  . albuterol (PROVENTIL) (2.5 MG/3ML) 0.083% nebulizer solution Take 3 mLs (2.5 mg total) by nebulization every 4 (four) hours as needed for wheezing or shortness of breath. 75 mL 3  . amLODipine (NORVASC) 5 MG tablet Take 1 tablet (5 mg total) by mouth daily. 30 tablet 3  . apixaban (ELIQUIS) 2.5 MG TABS tablet Take 1 tablet (2.5 mg total) by mouth 2 (two) times daily. 180 tablet 1  . carvedilol (COREG) 6.25 MG tablet TAKE 1 TABLET (6.25 MG TOTAL) BY MOUTH 2 (TWO) TIMES DAILY WITH A MEAL. 180 tablet 3  . clopidogrel (PLAVIX) 75 MG tablet Take 1 tablet (75 mg total) by mouth daily. TAKE 1 TABLET BY MOUTH EVERY DAY 90 tablet 3  . docusate sodium (STOOL SOFTENER) 100 MG capsule Take 1 capsule (100 mg total) by mouth 2 (two) times daily as needed for mild constipation. 30 capsule 0  . furosemide (LASIX) 40 MG tablet TAKE 1 TABLET BY MOUTH EVERY DAY 90 tablet 2  . glucose blood (FREESTYLE LITE) test strip 1 each by Other route as needed. Use as instructed     . Lancets (FREESTYLE) lancets 1 each by Other route as needed. Use as instructed     . levalbuterol (XOPENEX HFA) 45 MCG/ACT inhaler Inhale 2 puffs into the lungs every 4 (four) hours as needed for shortness of breath. 1 Inhaler 12  . levalbuterol (XOPENEX) 0.63 MG/3ML nebulizer solution Take 3 mLs (0.63 mg total) by nebulization every 6 (six) hours as needed for shortness of breath. 120 mL 5  .  Multiple Vitamins-Minerals (ICAPS AREDS 2 PO) Take 1 capsule by mouth daily.    . nitroGLYCERIN (NITROSTAT) 0.4 MG SL tablet Place 1 tablet (0.4 mg total) under the tongue every 5 (five) minutes x 3 doses as needed for chest pain (if no relief after 3rd dose, proceed to the ED for an evaluation). 75 tablet 1  . ondansetron (ZOFRAN) 4 MG tablet Take 1 tablet (4 mg total) by mouth every 6 (six) hours as needed for nausea. 20 tablet 0  . pantoprazole (PROTONIX) 40 MG tablet TAKE 1  TABLET BY MOUTH EVERY DAY 90 tablet 1  . Polyethyl Glycol-Propyl Glycol (LUBRICANT EYE DROPS) 0.4-0.3 % SOLN Place 1-2 drops into both eyes 3 (three) times daily as needed (dry/irritated eyes.).    Marland Kitchen rosuvastatin (CRESTOR) 20 MG tablet TAKE 1 TABLET BY MOUTH EVERY DAY 90 tablet 0  . sitaGLIPtin (JANUVIA) 50 MG tablet Take 1 tablet (50 mg total) by mouth daily. 30 tablet 3  . spironolactone (ALDACTONE) 25 MG tablet Take 1 tablet (25 mg total) by mouth daily. 90 tablet 2  . traMADol (ULTRAM) 50 MG tablet Take 1 tablet (50 mg total) by mouth every 6 (six) hours as needed. 1-2 every 6 h prn pain 120 tablet 5   No current facility-administered medications for this visit.    Allergies:   Codeine and Morphine    ROS:  Please see the history of present illness.   Otherwise, review of systems are positive for none .   All other systems are reviewed and negative.    PHYSICAL EXAM: VS:  BP (!) 142/76   Pulse 70   Ht 5\' 9"  (1.753 m)   Wt 179 lb 9.6 oz (81.5 kg)   SpO2 99%   BMI 26.52 kg/m  , BMI Body mass index is 26.52 kg/m. GENERAL:  Well appearing NECK:  No jugular venous distention, waveform within normal limits, carotid upstroke brisk and symmetric, no bruits, no thyromegaly LUNGS:  Diffuse scattered coarse crackles, perhaps mild fine basilar crackles at the base CHEST:  Well healed sternotomy scar. HEART:  PMI not displaced or sustained,S1 and S2 within normal limits, no S3,  no clicks, no rubs, no murmurs,  irregular ABD:  Flat, positive bowel sounds normal in frequency in pitch, no bruits, no rebound, no guarding, no midline pulsatile mass, no hepatomegaly, no splenomegaly EXT:  2 plus pulses throughout, no edema, no cyanosis no clubbing  EKG:  EKG is  ordered today. The ekg ordered today demonstrates atrial fibrillation, rate 70, left bundle branch block   Recent Labs: 03/14/2019: ALT 16 10/25/2019: Platelets 148 11/01/2019: Hemoglobin 14.6 12/27/2019: BNP 234.3 01/14/2020: BUN 24; Creatinine, Ser 1.98; Potassium 4.2; Sodium 135; TSH 3.82    Lipid Panel    Component Value Date/Time   CHOL 109 01/14/2020 0854   TRIG 132.0 01/14/2020 0854   TRIG 306 (HH) 09/27/2006 1154   HDL 26.50 (L) 01/14/2020 0854   CHOLHDL 4 01/14/2020 0854   VLDL 26.4 01/14/2020 0854   LDLCALC 56 01/14/2020 0854   LDLDIRECT 143.3 11/23/2007 0000      Wt Readings from Last 3 Encounters:  02/07/20 179 lb 9.6 oz (81.5 kg)  01/14/20 180 lb 11.2 oz (82 kg)  01/08/20 180 lb (81.6 kg)      Other studies Reviewed: Additional studies/ records that were reviewed today include: Pulmonary records and labs . Review of the above records demonstrates: See elsewhere  ASSESSMENT AND PLAN:  CAD  He has had no new symptoms since stress perfusion study in 2018.  I do not suspect his current symptoms are an anginal equivalent.  He will continue with risk reduction.   Atrial fib He is post DCCV.  However, he did not maintain sinus rhythm.  Its not entirely clear at all that he has symptoms are related to his fibrillation.  At this point I am going to continue with rate control and anticoagulation.  I would consider maybe Tikosyn if there is no clear pulmonary etiology or improvement.   Hypertension  BP is at target.  No change in therapy.   AS:   This was mild on echo in October 2020.  No further imaging.   Carotid stenosis-Bilateral  He has moderate left disease in June 2020.  He is going to get this next month  repeated.   CKD: Creatinine most recently was up to 1.98 which is slightly above his baseline.  He can watch this closely.   Chronic systolic and diastolic HF:  He did have a few crackles today.  We talked about salt and fluid restriction.  I am going to give him 2 days only of some increased Lasix.  Covid 19 education: He has received both of his vaccine shots.   Current medicines are reviewed at length with the patient today.  The patient does not have concerns regarding medicines.  The following changes have been made: As above  Labs/ tests ordered today include:   Orders Placed This Encounter  Procedures  . EKG 12-Lead     Disposition:   FU with me in 3 months   Signed, [name], MD  02/07/2020 10:24 AM    [place]

## 2020-02-07 ENCOUNTER — Other Ambulatory Visit: Payer: Self-pay

## 2020-02-07 ENCOUNTER — Ambulatory Visit: Payer: Medicare PPO | Admitting: Cardiology

## 2020-02-07 ENCOUNTER — Encounter: Payer: Self-pay | Admitting: Cardiology

## 2020-02-07 VITALS — BP 142/76 | HR 70 | Ht 69.0 in | Wt 179.6 lb

## 2020-02-07 DIAGNOSIS — N1831 Chronic kidney disease, stage 3a: Secondary | ICD-10-CM

## 2020-02-07 DIAGNOSIS — I6523 Occlusion and stenosis of bilateral carotid arteries: Secondary | ICD-10-CM | POA: Diagnosis not present

## 2020-02-07 DIAGNOSIS — I350 Nonrheumatic aortic (valve) stenosis: Secondary | ICD-10-CM

## 2020-02-07 DIAGNOSIS — I4819 Other persistent atrial fibrillation: Secondary | ICD-10-CM | POA: Diagnosis not present

## 2020-02-07 DIAGNOSIS — I2510 Atherosclerotic heart disease of native coronary artery without angina pectoris: Secondary | ICD-10-CM

## 2020-02-07 DIAGNOSIS — I10 Essential (primary) hypertension: Secondary | ICD-10-CM

## 2020-02-07 NOTE — Patient Instructions (Signed)
Medication Instructions:  TAKE AN ADDITIONAL DOSE OF 20MG  LASIX DAILY FOR 2 DAYS, THEN RESUME REGULAR DOSE *If you need a refill on your cardiac medications before your next appointment, please call your pharmacy*  Lab Work: NONE ORDERED THIS VISIT  Testing/Procedures: NONE ORDERED THIS VISIT  Follow-Up: At [facility], you and your health needs are our priority.  As part of our continuing mission to provide you with exceptional heart care, we have created designated Provider Care Teams.  These Care Teams include your primary Cardiologist (physician) and Advanced Practice Providers (APPs -  Physician Assistants and Nurse Practitioners) who all work together to provide you with the care you need, when you need it.  Your next appointment:   3 month(s)  The format for your next appointment:   In Person  Provider:   [name], MD

## 2020-02-22 ENCOUNTER — Other Ambulatory Visit: Payer: Self-pay | Admitting: Family Medicine

## 2020-03-10 ENCOUNTER — Telehealth: Payer: Self-pay | Admitting: Family Medicine

## 2020-03-10 MED ORDER — SITAGLIPTIN PHOSPHATE 50 MG PO TABS: 50.0000 mg | ORAL_TABLET | Freq: Every day | ORAL | 3 refills | Status: DC

## 2020-03-10 NOTE — Telephone Encounter (Signed)
Refill sent in

## 2020-03-10 NOTE — Telephone Encounter (Signed)
Refill Januvia for one year.

## 2020-03-10 NOTE — Telephone Encounter (Signed)
Please advise if you will refill ED

## 2020-03-10 NOTE — Telephone Encounter (Signed)
Pt is out of medications sitagliptin  (JANUVIA) 50 MG  Ph[facility] [address]

## 2020-03-11 ENCOUNTER — Ambulatory Visit: Payer: Medicare PPO | Admitting: Family Medicine

## 2020-03-11 ENCOUNTER — Telehealth: Payer: Self-pay | Admitting: Internal Medicine

## 2020-03-11 ENCOUNTER — Other Ambulatory Visit: Payer: Medicare PPO

## 2020-03-11 ENCOUNTER — Ambulatory Visit (INDEPENDENT_AMBULATORY_CARE_PROVIDER_SITE_OTHER): Payer: Medicare PPO

## 2020-03-11 ENCOUNTER — Other Ambulatory Visit: Payer: Self-pay

## 2020-03-11 ENCOUNTER — Encounter: Payer: Self-pay | Admitting: Family Medicine

## 2020-03-11 VITALS — BP 128/72 | HR 65 | Temp 97.9°F | Wt 176.6 lb

## 2020-03-11 DIAGNOSIS — M79671 Pain in right foot: Secondary | ICD-10-CM | POA: Diagnosis not present

## 2020-03-11 DIAGNOSIS — M25571 Pain in right ankle and joints of right foot: Secondary | ICD-10-CM | POA: Diagnosis not present

## 2020-03-11 DIAGNOSIS — S99921A Unspecified injury of right foot, initial encounter: Secondary | ICD-10-CM | POA: Diagnosis not present

## 2020-03-11 MED ORDER — OXYCODONE-ACETAMINOPHEN 5-325 MG PO TABS: 1.0000 | ORAL_TABLET | Freq: Four times a day (QID) | ORAL | 0 refills | Status: DC | PRN

## 2020-03-11 NOTE — Progress Notes (Signed)
Subjective:     Patient ID: [name], male   DOB: May 25, 1933, 86 y.o.   MRN: [id number]  HPI   [name] is seen with severe right foot pain and even some ankle pain following injury about 2 weeks ago.  He was cutting up a very large tree stump and this had a very large diameter and he had cut this in half and basically a large piece (estimated several 100 pounds) rolled over onto his foot.  He had some severe pain initially but never sought any medical attention.  He actually was able to ambulate afterwards but has had progressive pain since then.  He tried ice and tramadol without relief.  His pain has been severe.  He is requesting something for pain.  We recommend that he come in to get assessed and probable x-rays.  He has also noticed some lateral ankle pain over the past several days.  No other injuries reported.  Past Medical History:  Diagnosis Date  . Arthritis   . CAD (coronary artery disease)    a. Cath September 2015 LIMA to the LAD patent, SVG to PDA patent, SVG to posterior lateral patent, SVG to OM with a 90% in-stent restenosis at an anastomotic lesion. This was treated with angioplasty. b. cath 04/01/2015 95% ISR in SVG to OM treated with 2.75x24 Synergy DES postdilated to 3.8mm, all other grafts patent  . Cataract    surgery,B/L  . CHF (congestive heart failure) ([place])   . Chronic kidney disease    nephrolithiasis  . Diabetes mellitus    TYPE 2  . GERD (gastroesophageal reflux disease)   . Heart murmur   . Hernia   . Hypercholesterolemia   . Hypertension   . Incontinence    hx  over 1 year,leaks without awareness  . Other and unspecified diseases of appendix   . PAF (paroxysmal atrial fibrillation) ([place])    in the setting of ischemia on 03/31/2015  . Pancreatitis   . Prostate CA ([place]) 03/01/04   prostate bx=Adenocarcinoam,gleason 3+4=7,PSA=6.75  . Shortness of breath dyspnea   . Ulcer    hx gastric   Past Surgical History:  Procedure Laterality Date  .  APPENDECTOMY    . BACK SURGERY    . CARDIAC CATHETERIZATION N/A 04/01/2015   Procedure: Left Heart Cath and Cors/Grafts Angiography;  Surgeon: Jettie Booze, MD;  Location: [place] [facility];  Service: Cardiovascular;  Laterality: N/A;  . CARDIAC CATHETERIZATION N/A 04/01/2015   Procedure: Coronary Stent Intervention;  Surgeon: Jettie Booze, MD;  Location: [place] [facility];  Service: Cardiovascular;  Laterality: N/A;  . CARDIOVERSION N/A 11/01/2019   Procedure: CARDIOVERSION;  Surgeon: Geralynn Rile, MD;  Location: [place];  Service: Cardiovascular;  Laterality: N/A;  . CARPAL TUNNEL RELEASE  2004   both hands  . CORONARY ARTERY BYPASS GRAFT    . CYSTOSCOPY  08/23/12   incomplete emoptying bladder  . HERNIA REPAIR    . incision and drainage of right chest abscess    . LAPAROSCOPIC CHOLECYSTECTOMY  09/2009  . LEFT HEART CATHETERIZATION WITH CORONARY ANGIOGRAM N/A 07/19/2014   Procedure: LEFT HEART CATHETERIZATION WITH CORONARY ANGIOGRAM;  Surgeon: Leonie Man, MD;  Location: [facility] C[facility] LAB;  Service: Cardiovascular;  Laterality: N/A;  . RECTAL SURGERY    . ROBOT ASSISTED LAPAROSCOPIC RADICAL PROSTATECTOMY  2005    reports that he quit smoking about 47 years ago. His smoking use included cigarettes. He has a 2.50 pack-year  smoking history. He has never used smokeless tobacco. He reports that he does not drink alcohol or use drugs. family history includes Cancer in his mother. Allergies  Allergen Reactions  . Codeine   . Morphine     REACTION: does not like     Review of Systems  Neurological: Negative for weakness and numbness.       Objective:   Physical Exam Vitals reviewed.  Constitutional:      Appearance: Normal appearance.  Cardiovascular:     Rate and Rhythm: Normal rate.     Comments: has good capillary refill and right foot Pulmonary:     Effort: Pulmonary effort is normal.     Breath sounds: Normal breath sounds.  Musculoskeletal:         General: Swelling and tenderness present.     Right lower leg: Edema present.     Left lower leg: No edema.     Comments: He has extensive bruising involving the dorsum of the right foot extending all the way to the ankle.  He has moderate swelling of the right distal leg and foot.  He has increased tenderness involving several metatarsals proximal to about the mid metatarsal range and also some distal right fibular tenderness.  He has pain with dorsiflexion, plantarflexion and inversion and eversion.  No Achilles tenderness.  No distal tibia tenderness  Neurological:     Mental Status: He is alert.        Assessment:     Severe right foot and ankle pain following crush type injury 2 weeks ago.  He is surprisingly been ambulating on this for couple weeks but pain is getting worse.    Plan:     -Check x-rays right ankle and right foot -Wrote for limited Percocet 5/325 mg 1 every 6 hours as needed for severe pain.  We reviewed potential side effects.  He is aware this may cause some dizziness and even potentially confusion.  We wrote for #30 with no refill  Shawn Post MD [facility]

## 2020-03-11 NOTE — Telephone Encounter (Signed)
[facility]ex HFA, no longer covered on insurance plan. Can we use an alternative?

## 2020-03-12 ENCOUNTER — Other Ambulatory Visit: Payer: Self-pay | Admitting: Family Medicine

## 2020-03-12 NOTE — Telephone Encounter (Signed)
Please advise looks like hospital changed to 5mg  instead

## 2020-03-12 NOTE — Telephone Encounter (Signed)
PA has been started on [email or web address]. Key is [id number].   Will update once a determination is made.

## 2020-03-12 NOTE — Telephone Encounter (Signed)
Confirmed with patient he is taking 5mg 

## 2020-03-12 NOTE — Telephone Encounter (Signed)
Will probably need to confirm with patient but is currently on 5 mg we need to make sure he stays on the 5 and not the 10

## 2020-03-12 NOTE — Telephone Encounter (Signed)
Since he has atrial fibrillation he will need a PA for xopanex

## 2020-03-13 ENCOUNTER — Other Ambulatory Visit: Payer: Self-pay | Admitting: Family Medicine

## 2020-03-28 ENCOUNTER — Other Ambulatory Visit (HOSPITAL_COMMUNITY): Payer: Self-pay | Admitting: Cardiology

## 2020-03-28 ENCOUNTER — Other Ambulatory Visit: Payer: Self-pay

## 2020-03-28 ENCOUNTER — Ambulatory Visit (HOSPITAL_COMMUNITY)
Admission: RE | Admit: 2020-03-28 | Discharge: 2020-03-28 | Disposition: A | Discharge status: Home / Self Care | Payer: Medicare PPO | Source: Ambulatory Visit | Attending: Cardiology | Admitting: Cardiology

## 2020-03-28 DIAGNOSIS — I6523 Occlusion and stenosis of bilateral carotid arteries: Secondary | ICD-10-CM | POA: Diagnosis not present

## 2020-03-29 ENCOUNTER — Other Ambulatory Visit (HOSPITAL_COMMUNITY)
Admission: RE | Admit: 2020-03-29 | Discharge: 2020-03-29 | Disposition: A | Discharge status: Home / Self Care | Payer: Medicare PPO | Source: Ambulatory Visit | Attending: Internal Medicine | Admitting: Internal Medicine

## 2020-03-29 DIAGNOSIS — Z01812 Encounter for preprocedural laboratory examination: Secondary | ICD-10-CM | POA: Insufficient documentation

## 2020-03-29 DIAGNOSIS — Z20822 Contact with and (suspected) exposure to covid-19: Secondary | ICD-10-CM | POA: Diagnosis not present

## 2020-03-29 LAB — SARS CORONAVIRUS 2 (TAT 6-24 HRS): SARS Coronavirus 2: NEGATIVE

## 2020-03-31 ENCOUNTER — Telehealth: Payer: Self-pay

## 2020-03-31 DIAGNOSIS — I6523 Occlusion and stenosis of bilateral carotid arteries: Secondary | ICD-10-CM

## 2020-03-31 NOTE — Telephone Encounter (Signed)
-----   Message from [name], MD sent at 03/28/2020  5:02 PM EDT ----- Right Carotid: Velocities in the right ICA are consistent with a 1-39% stenosis. Left Carotid: Velocities in the left ICA are consistent with a 60-79% stenosis. Follow up in 12 months with repeat Doppler Call Mr. Buffa with the results and send results to [name], MD

## 2020-03-31 NOTE — Telephone Encounter (Signed)
Results of carotid dopplers left on home answering machine per DPR.

## 2020-04-02 ENCOUNTER — Ambulatory Visit (INDEPENDENT_AMBULATORY_CARE_PROVIDER_SITE_OTHER): Payer: Medicare PPO | Admitting: Internal Medicine

## 2020-04-02 ENCOUNTER — Ambulatory Visit: Payer: Medicare PPO | Admitting: Primary Care

## 2020-04-02 ENCOUNTER — Other Ambulatory Visit: Payer: Self-pay

## 2020-04-02 ENCOUNTER — Ambulatory Visit (INDEPENDENT_AMBULATORY_CARE_PROVIDER_SITE_OTHER): Payer: Medicare PPO

## 2020-04-02 VITALS — BP 126/84 | HR 82 | Temp 98.1°F | Ht 69.0 in | Wt 179.0 lb

## 2020-04-02 DIAGNOSIS — J449 Chronic obstructive pulmonary disease, unspecified: Secondary | ICD-10-CM | POA: Diagnosis not present

## 2020-04-02 DIAGNOSIS — R0602 Shortness of breath: Secondary | ICD-10-CM

## 2020-04-02 DIAGNOSIS — R0989 Other specified symptoms and signs involving the circulatory and respiratory systems: Secondary | ICD-10-CM | POA: Diagnosis not present

## 2020-04-02 LAB — CBC WITH DIFFERENTIAL/PLATELET
Basophils Absolute: 0.1 10*3/uL (ref 0.0–0.1)
Basophils Relative: 0.6 % (ref 0.0–3.0)
Eosinophils Absolute: 0.2 10*3/uL (ref 0.0–0.7)
Eosinophils Relative: 1.8 % (ref 0.0–5.0)
HCT: 41.6 % (ref 39.0–52.0)
Hemoglobin: 14.2 g/dL (ref 13.0–17.0)
Lymphocytes Relative: 14.4 % (ref 12.0–46.0)
Lymphs Abs: 1.5 10*3/uL (ref 0.7–4.0)
MCHC: 34.1 g/dL (ref 30.0–36.0)
MCV: 90.5 fl (ref 78.0–100.0)
Monocytes Absolute: 0.8 10*3/uL (ref 0.1–1.0)
Monocytes Relative: 7.9 % (ref 3.0–12.0)
Neutro Abs: 7.6 10*3/uL (ref 1.4–7.7)
Neutrophils Relative %: 75.3 % (ref 43.0–77.0)
Platelets: 158 10*3/uL (ref 150.0–400.0)
RBC: 4.6 Mil/uL (ref 4.22–5.81)
RDW: 14 % (ref 11.5–15.5)
WBC: 10.1 10*3/uL (ref 4.0–10.5)

## 2020-04-02 LAB — PULMONARY FUNCTION TEST
DL/VA % pred: 103 %
DL/VA: 3.96 ml/min/mmHg/L
DLCO cor % pred: 63 %
DLCO cor: 14.59 ml/min/mmHg
DLCO unc % pred: 63 %
DLCO unc: 14.59 ml/min/mmHg
FEF 25-75 Post: 1.24 L/sec
FEF 25-75 Pre: 0.89 L/sec
FEF2575-%Change-Post: 38 %
FEF2575-%Pred-Post: 75 %
FEF2575-%Pred-Pre: 54 %
FEV1-%Change-Post: 17 %
FEV1-%Pred-Post: 60 %
FEV1-%Pred-Pre: 51 %
FEV1-Post: 1.53 L
FEV1-Pre: 1.3 L
FEV1FVC-%Change-Post: 8 %
FEV1FVC-%Pred-Pre: 88 %
FEV6-%Change-Post: 9 %
FEV6-%Pred-Post: 67 %
FEV6-%Pred-Pre: 61 %
FEV6-Post: 2.26 L
FEV6-Pre: 2.06 L
FEV6FVC-%Change-Post: 0 %
FEV6FVC-%Pred-Post: 107 %
FEV6FVC-%Pred-Pre: 108 %
FVC-%Change-Post: 8 %
FVC-%Pred-Post: 62 %
FVC-%Pred-Pre: 57 %
FVC-Post: 2.26 L
FVC-Pre: 2.08 L
Post FEV1/FVC ratio: 68 %
Post FEV6/FVC ratio: 100 %
Pre FEV1/FVC ratio: 62 %
Pre FEV6/FVC Ratio: 100 %
RV % pred: 104 %
RV: 2.82 L
TLC % pred: 79 %
TLC: 5.48 L

## 2020-04-02 LAB — BRAIN NATRIURETIC PEPTIDE: Pro B Natriuretic peptide (BNP): 359 pg/mL — ABNORMAL HIGH (ref 0.0–100.0)

## 2020-04-02 MED ORDER — TRELEGY ELLIPTA 100-62.5-25 MCG/INH IN AEPB: 1.0000 | INHALATION_SPRAY | Freq: Every day | RESPIRATORY_TRACT | 0 refills | Status: DC

## 2020-04-02 NOTE — Progress Notes (Signed)
Full PFT performed today. °

## 2020-04-02 NOTE — Progress Notes (Signed)
Please let patient know CXR showed no active cardiopulmonary abnormalities. Mild cardiac enlargement, no pneumonia.   BNP was mildly elevated, recommend he take additional lasix tablet in the afternoon for 3 days. Follow up with cardiology in July as scheduled.

## 2020-04-02 NOTE — Patient Instructions (Addendum)
Pulmonary function testing today showed moderate obstructive and restrictive lung disease. Likely due to mixed disease COPD/Asthma   Recommendations: - Trial Trelegy 1 puff daily  (rinse mouth after use) - Continue Albuterol rescue inhaler 2 puffs every 4-6 hours as needed for breakthrough shortness of breath or wheezing   Orders: - Labs and CXR today   Follow-up: - 4-6 week televisit with Eustaquio Maize NP

## 2020-04-02 NOTE — Progress Notes (Signed)
@Patient  ID: [name], male    DOB: 07/10/1933, 86 y.o.   MRN: [id number]  Chief Complaint  Patient presents with  . Follow-up    pt is here to go over pft results and on going sob when doing activities    Referring provider: [name], MD  HPI: 86 year old male, smoker quit 1973 (2.5-pack-year history).  Past medical history significant for chronic systolic heart failure, hypertension, left bundle branch block, proximal A. fib, coronary artery disease, GERD, type 2 diabetes.  Patient of Dr. Shearon Stalls, seen for initial consult March 2021.  Previous pulmonary encounter: 01/08/20- Dr. Shearon Stalls, Consult  Shortness of breath progressing over the last 2 years, progressing over the last 6 months.  Shortness of breath at rest and with exertion and talking. Breathing improves with rest. He has difficulty describing the pain - feels like a mix of burning tightness and pressure.  Now it is getting harder to talk.  History of CABG over 10 years ago, has had multiple stents place most recently in 2016.  Has had cardioversion for A. Fib as recently as Jan 2021 which did not work. He is on eliquis. He can sometimes feel when he is going in and out of A. Fib.   Hospitalized for heart failure exacerbation in October 2020 and treated for pneumonia/bronchitis as well.   Has been prescribed albuterol inhaler in the past and it does help his breathing but has no impact on his chest tightness and burning. He does have heart burn and has difficulty distinguishing heart burn and chest pain and this is also folded in with his trouble breathing. He does take pantoprazole.    He also has a neublizer machine and it helps for 1-2 hours.   No orthopnea or PND. Wife denies any witnessed apneas, he has no excessive daytime sleepiness.   Sitting here in the chair he is short of breath with a respiratory rate in the 20s and appears to be hyperventilating.  Patient was given trial of Xopenex inhaler and nebulizer  solution   04/02/2020 Presents today for a follow-up visit with pulmonary function test. Reports shortness of breath with any activity for the last 2 years. He may have some nocturnal wheezing. He has a chronic haking cough which is non-productive. States that levalbuterol helps for a little while but does not last long. He does not use his rescue inhaler or nebulizer on a regular basis. He follows closely with cardiology every 3 months, next appointment is in July.   Pulmonary function testing today showed moderate obstructive lung disease with moderate restriction.  There was bronchodilator response.  Lung capacity diminished 79%.  DLCO also reduced at 63%  He has a light smoking hx. He smoked 1/3 pack daily from age [age]. He quit smoking at 35 or 86 year old.   Social history: Exposures: lives at home with his wife of 74 years. Smoking history: very remote over 60 years ago.   Pulmonary function testing-FVC 2.26 (62%), FEV1 1.53 (60%), ratio 68%, TLC 79%, DLCO 14.59 (63%) Moderate obstruction with moderate restriction; positive bronchodilator response; decreased diffusion capacity   Allergies  Allergen Reactions  . Codeine   . Morphine     REACTION: does not like    Immunization History  Administered Date(s) Administered  . Influenza Split 10/04/2011  . Influenza Whole 07/16/2009  . Influenza, High Dose Seasonal PF 06/18/2016, 09/13/2018, 07/19/2019  . Influenza,inj,Quad PF,6+ Mos 08/13/2014  . Influenza-Unspecified 08/13/2017  . PFIZER SARS-COV-2 Vaccination  11/24/2019, 12/15/2019  . Pneumococcal Conjugate-13 05/31/2008, 06/03/2014  . Pneumococcal Polysaccharide-23 11/17/2006  . Td 11/23/2007, 10/18/2012  . Zoster 10/18/2013    Past Medical History:  Diagnosis Date  . Arthritis   . CAD (coronary artery disease)    a. Cath September 2015 LIMA to the LAD patent, SVG to PDA patent, SVG to posterior lateral patent, SVG to OM with a 90% in-stent restenosis at an anastomotic  lesion. This was treated with angioplasty. b. cath 04/01/2015 95% ISR in SVG to OM treated with 2.75x24 Synergy DES postdilated to 3.88mm, all other grafts patent  . Cataract    surgery,B/L  . CHF (congestive heart failure) ([place])   . Chronic kidney disease    nephrolithiasis  . Diabetes mellitus    TYPE 2  . GERD (gastroesophageal reflux disease)   . Heart murmur   . Hernia   . Hypercholesterolemia   . Hypertension   . Incontinence    hx  over 1 year,leaks without awareness  . Other and unspecified diseases of appendix   . PAF (paroxysmal atrial fibrillation) ([place])    in the setting of ischemia on 03/31/2015  . Pancreatitis   . Prostate CA ([place]) 03/01/04   prostate bx=Adenocarcinoam,gleason 3+4=7,PSA=6.75  . Shortness of breath dyspnea   . Ulcer    hx gastric    Tobacco History: Social History   Tobacco Use  Smoking Status Former Smoker  . Packs/day: 0.50  . Years: 5.00  . Pack years: 2.50  . Types: Cigarettes  . Quit date: 07/20/1972  . Years since quitting: 47.7  Smokeless Tobacco Never Used  Tobacco Comment   quit s   Counseling given: Not Answered Comment: quit s   Outpatient Medications Prior to Visit  Medication Sig Dispense Refill  . acetaminophen (TYLENOL) 325 MG tablet Take 2 tablets (650 mg total) by mouth every 6 (six) hours as needed for mild pain, fever or headache. 60 tablet 1  . albuterol (PROVENTIL) (2.5 MG/3ML) 0.083% nebulizer solution Take 3 mLs (2.5 mg total) by nebulization every 4 (four) hours as needed for wheezing or shortness of breath. 75 mL 3  . amLODipine (NORVASC) 5 MG tablet Take 1 tablet (5 mg total) by mouth daily. 30 tablet 3  . amLODipine (NORVASC) 5 MG tablet Take 1 tablet (5 mg total) by mouth daily. 90 tablet 3  . apixaban (ELIQUIS) 2.5 MG TABS tablet Take 1 tablet (2.5 mg total) by mouth 2 (two) times daily. 180 tablet 1  . carvedilol (COREG) 6.25 MG tablet TAKE 1 TABLET (6.25 MG TOTAL) BY MOUTH 2 (TWO) TIMES DAILY WITH A MEAL. 180  tablet 3  . clopidogrel (PLAVIX) 75 MG tablet TAKE 1 TABLET (75 MG TOTAL) BY MOUTH DAILY. 90 tablet 3  . docusate sodium (STOOL SOFTENER) 100 MG capsule Take 1 capsule (100 mg total) by mouth 2 (two) times daily as needed for mild constipation. 30 capsule 0  . furosemide (LASIX) 40 MG tablet TAKE 1 TABLET BY MOUTH EVERY DAY 90 tablet 2  . glucose blood (FREESTYLE LITE) test strip 1 each by Other route as needed. Use as instructed     . Lancets (FREESTYLE) lancets 1 each by Other route as needed. Use as instructed     . levalbuterol (XOPENEX HFA) 45 MCG/ACT inhaler Inhale 2 puffs into the lungs every 4 (four) hours as needed for shortness of breath. 1 Inhaler 12  . levalbuterol (XOPENEX) 0.63 MG/3ML nebulizer solution Take 3 mLs (0.63 mg total) by nebulization  every 6 (six) hours as needed for shortness of breath. 120 mL 5  . Multiple Vitamins-Minerals (ICAPS AREDS 2 PO) Take 1 capsule by mouth daily.    . nitroGLYCERIN (NITROSTAT) 0.4 MG SL tablet Place 1 tablet (0.4 mg total) under the tongue every 5 (five) minutes x 3 doses as needed for chest pain (if no relief after 3rd dose, proceed to the ED for an evaluation). 75 tablet 1  . pantoprazole (PROTONIX) 40 MG tablet TAKE 1 TABLET BY MOUTH EVERY DAY 90 tablet 1  . Polyethyl Glycol-Propyl Glycol (LUBRICANT EYE DROPS) 0.4-0.3 % SOLN Place 1-2 drops into both eyes 3 (three) times daily as needed (dry/irritated eyes.).    Marland Kitchen rosuvastatin (CRESTOR) 20 MG tablet TAKE 1 TABLET BY MOUTH EVERY DAY 90 tablet 0  . sitaGLIPtin (JANUVIA) 50 MG tablet Take 1 tablet (50 mg total) by mouth daily. 90 tablet 3  . spironolactone (ALDACTONE) 25 MG tablet Take 1 tablet (25 mg total) by mouth daily. 90 tablet 2  . traMADol (ULTRAM) 50 MG tablet Take 1 tablet (50 mg total) by mouth every 6 (six) hours as needed. 1-2 every 6 h prn pain 120 tablet 5  . ondansetron (ZOFRAN) 4 MG tablet Take 1 tablet (4 mg total) by mouth every 6 (six) hours as needed for nausea. 20 tablet 0    . oxyCODONE-acetaminophen (PERCOCET/ROXICET) 5-325 MG tablet Take 1-2 tablets by mouth every 6 (six) hours as needed for severe pain. 30 tablet 0   No facility-administered medications prior to visit.    Review of Systems  Review of Systems  Constitutional: Negative.   HENT: Negative.   Respiratory: Positive for cough and shortness of breath.   Cardiovascular: Negative.    Physical Exam  BP 126/84 (BP Location: Left Arm, Cuff Size: Normal)   Pulse 82   Temp 98.1 F (36.7 C) (Oral)   Ht 5\' 9"  (1.753 m)   Wt 179 lb (81.2 kg)   SpO2 96%   BMI 26.43 kg/m  Physical Exam Constitutional:      Appearance: Normal appearance.  HENT:     Head: Normocephalic and atraumatic.     Mouth/Throat:     Mouth: Mucous membranes are moist.     Pharynx: Oropharynx is clear.  Cardiovascular:     Rate and Rhythm: Normal rate and regular rhythm.  Pulmonary:     Effort: Pulmonary effort is normal.     Breath sounds: Normal breath sounds.  Neurological:     General: No focal deficit present.     Mental Status: He is alert and oriented to person, place, and time. Mental status is at baseline.  Psychiatric:        Mood and Affect: Mood normal.        Behavior: Behavior normal.        Thought Content: Thought content normal.        Judgment: Judgment normal.     Lab Results:  CBC    Component Value Date/Time   WBC 10.1 04/02/2020 1053   RBC 4.60 04/02/2020 1053   HGB 14.2 04/02/2020 1053   HGB 15.2 10/25/2019 1202   HCT 41.6 04/02/2020 1053   HCT 46.1 10/25/2019 1202   PLT 158.0 04/02/2020 1053   PLT 148 (L) 10/25/2019 1202   MCV 90.5 04/02/2020 1053   MCV 91 10/25/2019 1202   MCH 30.1 10/25/2019 1202   MCH 32.6 07/28/2019 0405   MCHC 34.1 04/02/2020 1053   RDW 14.0 04/02/2020 1053  RDW 12.8 10/25/2019 1202   LYMPHSABS 1.5 04/02/2020 1053   MONOABS 0.8 04/02/2020 1053   EOSABS 0.2 04/02/2020 1053   BASOSABS 0.1 04/02/2020 1053    BMET    Component Value Date/Time   NA  135 01/14/2020 0854   NA 140 10/25/2019 1202   K 4.2 01/14/2020 0854   CL 97 01/14/2020 0854   CO2 29 01/14/2020 0854   GLUCOSE 215 (H) 01/14/2020 0854   GLUCOSE 133 (H) 09/27/2006 1154   BUN 24 (H) 01/14/2020 0854   BUN 20 10/25/2019 1202   CREATININE 1.98 (H) 01/14/2020 0854   CREATININE 1.91 (H) 08/13/2015 1402   CALCIUM 9.8 01/14/2020 0854   GFRNONAA 36 (L) 10/25/2019 1202   GFRAA 41 (L) 10/25/2019 1202    BNP    Component Value Date/Time   BNP 234.3 (H) 12/27/2019 1120   BNP 441.0 (H) 07/26/2019 0849    ProBNP    Component Value Date/Time   PROBNP 359.0 (H) 04/02/2020 1053    Imaging: DG Chest 2 View  Result Date: 04/02/2020 CLINICAL DATA:  COPD.  Rales identified in the right base. EXAM: CHEST - 2 VIEW COMPARISON:  07/26/2019 FINDINGS: Previous median sternotomy and CABG procedure. Aortic atherosclerosis. Mild cardiac enlargement. No airspace consolidation. No pleural effusion or edema. No acute bone abnormality. IMPRESSION: No active cardiopulmonary abnormalities. Electronically Signed   By: Kerby Moors M.D.   On: 04/02/2020 15:44   DG Ankle Complete Right  Result Date: 03/12/2020 CLINICAL DATA:  Right ankle pain following blunt trauma, initial encounter EXAM: RIGHT ANKLE - COMPLETE 3+ VIEW COMPARISON:  None. FINDINGS: Mild soft tissue swelling is noted about the ankle joint. Vascular calcifications are seen. No acute fracture or dislocation is seen. Calcaneal spurring is noted. IMPRESSION: Mild soft tissue swelling without acute bony abnormality. Electronically Signed   By: Inez Catalina M.D.   On: 03/12/2020 08:48   DG Foot Complete Right  Result Date: 03/12/2020 CLINICAL DATA:  Recent blunt trauma with right foot pain, initial encounter EXAM: RIGHT FOOT COMPLETE - 3+ VIEW COMPARISON:  None. FINDINGS: Mild soft tissue swelling is noted about the ankle. Vascular calcifications are seen. Calcaneal spurring is noted. No acute fracture or dislocation is noted.  IMPRESSION: No acute bony abnormality noted. Electronically Signed   By: Inez Catalina M.D.   On: 03/12/2020 08:49   VAS US CAROTID  Result Date: 03/28/2020 Carotid Arterial Duplex Study Indications:       Carotid artery disease and Patient denies any cerebrovascular                    symptoms. Risk Factors:      Hypertension, hyperlipidemia, Diabetes, past history of                    smoking, coronary artery disease. Comparison Study:  In 03/2019, a carotid duplex showed a RICA velocity of 152/25                    cm/s and a LICA velocity of 580/99 cm/s. Performing Technologist: Wilkie Aye RVT  Examination Guidelines: A complete evaluation includes B-mode imaging, spectral Doppler, color Doppler, and power Doppler as needed of all accessible portions of each vessel. Bilateral testing is considered an integral part of a complete examination. Limited examinations for reoccurring indications may be performed as noted.  Right Carotid Findings: +----------+--------+--------+--------+--------------------------+--------+           PSV cm/sEDV cm/sStenosisPlaque Description  Comments +----------+--------+--------+--------+--------------------------+--------+ CCA Prox  57      10                                                 +----------+--------+--------+--------+--------------------------+--------+ CCA Mid   54      9                                                  +----------+--------+--------+--------+--------------------------+--------+ CCA Distal47      8       <50%    heterogenous                       +----------+--------+--------+--------+--------------------------+--------+ ICA Prox  133     35      1-39%   heterogenous and irregular         +----------+--------+--------+--------+--------------------------+--------+ ICA Mid   73      20                                                 +----------+--------+--------+--------+--------------------------+--------+  ICA Distal76      21                                                 +----------+--------+--------+--------+--------------------------+--------+ ECA       97      17              heterogenous                       +----------+--------+--------+--------+--------------------------+--------+ +----------+--------+-------+---------+-------------------+           PSV cm/sEDV cmsDescribe Arm Pressure (mmHG) +----------+--------+-------+---------+-------------------+ Subclavian160            Turbulent130                 +----------+--------+-------+---------+-------------------+ +---------+--------+--+--------+-+---------+ VertebralPSV cm/s27EDV cm/s7Antegrade +---------+--------+--+--------+-+---------+  Left Carotid Findings: +----------+--------+--------+--------+--------------------------+--------+           PSV cm/sEDV cm/sStenosisPlaque Description        Comments +----------+--------+--------+--------+--------------------------+--------+ CCA Prox  63      14                                                 +----------+--------+--------+--------+--------------------------+--------+ CCA Mid   35      7                                                  +----------+--------+--------+--------+--------------------------+--------+ CCA Distal42      12                                                 +----------+--------+--------+--------+--------------------------+--------+  ICA Prox  327     85      60-79%  heterogenous and irregular         +----------+--------+--------+--------+--------------------------+--------+ ICA Mid   119     18                                                 +----------+--------+--------+--------+--------------------------+--------+ ICA Distal83      20                                                 +----------+--------+--------+--------+--------------------------+--------+ ECA       277     11      >50%     heterogenous and irregular         +----------+--------+--------+--------+--------------------------+--------+ +----------+--------+--------+---------+-------------------+           PSV cm/sEDV cm/sDescribe Arm Pressure (mmHG) +----------+--------+--------+---------+-------------------+ Subclavian128             Turbulent130                 +----------+--------+--------+---------+-------------------+ +---------+--------+--+--------+-+---------+ VertebralPSV cm/s34EDV cm/s9Antegrade +---------+--------+--+--------+-+---------+   Summary: Right Carotid: Velocities in the right ICA are consistent with a 1-39% stenosis.                Non-hemodynamically significant plaque <50% noted in the CCA. Left Carotid: Velocities in the left ICA are consistent with a 60-79% stenosis.               Non-hemodynamically significant plaque <50% noted in the CCA. The               ECA appears >50% stenosed. Vertebrals:  Bilateral vertebral arteries demonstrate antegrade flow. Subclavians: Bilateral subclavian artery flow was disturbed. *See table(s) above for measurements and observations. Suggest follow up study in 12 months. Electronically signed by Ida Rogue MD on 03/28/2020 at 3:43:53 PM.    Final      Assessment & Plan:   COPD mixed type [facility]) Pulmonary function testing today showed moderate obstructive and restrictive lung disease. Positive BD response. Likely due to mixed disease COPD/Asthma (FEV1 1.53/ 60%, ratio 68)  Plan: - Trial Trelegy 1 puff daily  (rinse mouth after use); Continue Albuterol rescue inhaler 2 puffs every 4-6 hours as needed for breakthrough shortness of breath or wheezing  - Orders: Labs and CXR today  - Follow-up: 4-6 week televisit with Beth NP   Martyn Ehrich, NP 04/03/2020

## 2020-04-03 ENCOUNTER — Telehealth: Payer: Self-pay | Admitting: Internal Medicine

## 2020-04-03 DIAGNOSIS — J449 Chronic obstructive pulmonary disease, unspecified: Secondary | ICD-10-CM | POA: Insufficient documentation

## 2020-04-03 NOTE — Assessment & Plan Note (Addendum)
Pulmonary function testing today showed moderate obstructive and restrictive lung disease. Positive BD response. Likely due to mixed disease COPD/Asthma (FEV1 1.53/ 60%, ratio 68)  Plan: - Trial Trelegy 1 puff daily  (rinse mouth after use); Continue Albuterol rescue inhaler 2 puffs every 4-6 hours as needed for breakthrough shortness of breath or wheezing  - Orders: Labs and CXR today  - Follow-up: 4-6 week televisit with Eustaquio Maize NP

## 2020-04-03 NOTE — Telephone Encounter (Signed)
Martyn Ehrich, NP  P [facility] Triage Pool Please let patient know CXR showed no active cardiopulmonary abnormalities. Mild cardiac enlargement, no pneumonia.   BNP was mildly elevated, recommend he take additional lasix tablet in the afternoon for 3 days. Follow up with cardiology in July as scheduled.   Called and spoke with pt's wife [name] letting her know the results of labwork and cxr and she verbalized understanding. Nothing further needed.

## 2020-04-10 ENCOUNTER — Telehealth: Payer: Self-pay | Admitting: Primary Care

## 2020-04-10 LAB — ALPHA-1 ANTITRYPSIN PHENOTYPE: A-1 Antitrypsin, Ser: 161 mg/dL (ref 83–199)

## 2020-04-10 MED ORDER — TRELEGY ELLIPTA 100-62.5-25 MCG/INH IN AEPB: 1.0000 | INHALATION_SPRAY | Freq: Every day | RESPIRATORY_TRACT | 3 refills | Status: DC

## 2020-04-10 NOTE — Telephone Encounter (Signed)
Called spoke with patient verified pharmacy Rx sent in   Nothing further needed at this time.

## 2020-04-10 NOTE — Telephone Encounter (Signed)
Called and spoke with patient about labs and to let him know about letting his Cardiologist know about his BNP. He states that she feels like Trelegy is really helping him and would like a prescription sent in to his paharmacy.  Beth you ok with me sending in RX for Trelegy?  Also his wife stated that he is taking 40 mg of Lasix twice a day already.

## 2020-04-10 NOTE — Telephone Encounter (Signed)
Ok, yes. Thanks

## 2020-04-10 NOTE — Progress Notes (Signed)
Please let patient know his Alpha 1 level was normal. His BNP was elevated, recommend he discuss this at his visit with cardiology- could likely be contributing to shortness of breath.  Does he have any diuretics?  If not we could try a trial of 20mg  once daily x 1 week

## 2020-04-22 ENCOUNTER — Telehealth: Payer: Self-pay | Admitting: *Deleted

## 2020-04-22 ENCOUNTER — Other Ambulatory Visit: Payer: Self-pay | Admitting: Family Medicine

## 2020-04-22 NOTE — Telephone Encounter (Signed)
Refills sent and wife aware.

## 2020-04-22 NOTE — Telephone Encounter (Signed)
Okay to fill? 

## 2020-04-22 NOTE — Telephone Encounter (Signed)
OK to refill

## 2020-04-22 NOTE — Telephone Encounter (Signed)
Patient's wife called after hours line on 04/21/2020. Wife reports her husband doesn't have his medicine they are on vacation the pharmacist is needing a prescription to fill some of the ones they were unable to obtain loaner doses for.

## 2020-04-28 ENCOUNTER — Ambulatory Visit: Payer: Medicare PPO | Admitting: Family Medicine

## 2020-04-28 ENCOUNTER — Other Ambulatory Visit: Payer: Self-pay

## 2020-04-28 ENCOUNTER — Encounter: Payer: Self-pay | Admitting: Family Medicine

## 2020-04-28 VITALS — BP 120/62 | HR 64 | Temp 97.9°F | Wt 180.3 lb

## 2020-04-28 DIAGNOSIS — E1121 Type 2 diabetes mellitus with diabetic nephropathy: Secondary | ICD-10-CM

## 2020-04-28 LAB — POCT GLYCOSYLATED HEMOGLOBIN (HGB A1C): Hemoglobin A1C: 7.9 % — AB (ref 4.0–5.6)

## 2020-04-28 MED ORDER — FREESTYLE LIBRE 14 DAY READER DEVI: 1.0000 | 3 refills | Status: DC

## 2020-04-28 MED ORDER — FREESTYLE LIBRE 14 DAY SENSOR MISC: 1.0000 | 3 refills | Status: DC

## 2020-04-28 NOTE — Progress Notes (Signed)
Established Patient Office Visit  Subjective:  Patient ID: [name], male    DOB: 02-02-1933  Age: 86 y.o. MRN: [id number]  CC:  Chief Complaint  Patient presents with  . Follow-up    pt is here for follow up on diabetes    HPI [name] presents for medical follow-up.  He has had some ongoing dyspnea issues and has been evaluated extensively by cardiology and pulmonary.  Recent BNP level over 300.  Recent pulmonary function tests and has follow-up with pulmonary in a couple days to review.  He continues to stay very active with gardening and yardwork usually about 8 hours/day and tolerating that fairly well.  His last A1c was 7.7%.  He states he has been eating a lot of things like corn and watermelon over the summer.  He realizes may be running his sugar up somewhat.  Is not monitoring sugars regularly.  At this point only takes Januvia 50 mg.  He has chronic kidney disease with recent GFR 32.3.  He has been fairly opposed to the idea of long-acting insulin.    Past Medical History:  Diagnosis Date  . Arthritis   . CAD (coronary artery disease)    a. Cath September 2015 LIMA to the LAD patent, SVG to PDA patent, SVG to posterior lateral patent, SVG to OM with a 90% in-stent restenosis at an anastomotic lesion. This was treated with angioplasty. b. cath 04/01/2015 95% ISR in SVG to OM treated with 2.75x24 Synergy DES postdilated to 3.13mm, all other grafts patent  . Cataract    surgery,B/L  . CHF (congestive heart failure) ([place])   . Chronic kidney disease    nephrolithiasis  . Diabetes mellitus    TYPE 2  . GERD (gastroesophageal reflux disease)   . Heart murmur   . Hernia   . Hypercholesterolemia   . Hypertension   . Incontinence    hx  over 1 year,leaks without awareness  . Other and unspecified diseases of appendix   . PAF (paroxysmal atrial fibrillation) ([place])    in the setting of ischemia on 03/31/2015  . Pancreatitis   . Prostate CA ([place]) 03/01/04   prostate  bx=Adenocarcinoam,gleason 3+4=7,PSA=6.75  . Shortness of breath dyspnea   . Ulcer    hx gastric    Past Surgical History:  Procedure Laterality Date  . APPENDECTOMY    . BACK SURGERY    . CARDIAC CATHETERIZATION N/A 04/01/2015   Procedure: Left Heart Cath and Cors/Grafts Angiography;  Surgeon: Jettie Booze, MD;  Location: [facility];  Service: Cardiovascular;  Laterality: N/A;  . CARDIAC CATHETERIZATION N/A 04/01/2015   Procedure: Coronary Stent Intervention;  Surgeon: Jettie Booze, MD;  Location: [facility];  Service: Cardiovascular;  Laterality: N/A;  . CARDIOVERSION N/A 11/01/2019   Procedure: CARDIOVERSION;  Surgeon: Geralynn Rile, MD;  Location: [facility];  Service: Cardiovascular;  Laterality: N/A;  . CARPAL TUNNEL RELEASE  2004   both hands  . CORONARY ARTERY BYPASS GRAFT    . CYSTOSCOPY  08/23/12   incomplete emoptying bladder  . HERNIA REPAIR    . incision and drainage of right chest abscess    . LAPAROSCOPIC CHOLECYSTECTOMY  09/2009  . LEFT HEART CATHETERIZATION WITH CORONARY ANGIOGRAM N/A 07/19/2014   Procedure: LEFT HEART CATHETERIZATION WITH CORONARY ANGIOGRAM;  Surgeon: Leonie Man, MD;  Location: [facility];  Service: Cardiovascular;  Laterality: N/A;  . RECTAL SURGERY    . ROBOT  ASSISTED LAPAROSCOPIC RADICAL PROSTATECTOMY  2005    Family History  Problem Relation Age of Onset  . Cancer Mother        stomach    Social History   Socioeconomic History  . Marital status: Married    Spouse name: Not on file  . Number of children: 2  . Years of education: Not on file  . Highest education level: Not on file  Occupational History    Employer: RETIRED    Comment: Retired  Tobacco Use  . Smoking status: Former Smoker    Packs/day: 0.50    Years: 5.00    Pack years: 2.50    Types: Cigarettes    Quit date: 07/20/1972    Years since quitting: 47.8  . Smokeless tobacco: Never Used  . Tobacco comment: quit s  Vaping Use   . Vaping Use: Never used  Substance and Sexual Activity  . Alcohol use: No  . Drug use: No    Comment: quit smoking 40 years ago  . Sexual activity: Never  Other Topics Concern  . Not on file  Social History Narrative   Retired   Married      Social Determinants of Radio broadcast assistant Strain:   . Difficulty of Paying Living Expenses:   Food Insecurity:   . Worried About Charity fundraiser in the Last Year:   . Arboriculturist in the Last Year:   Transportation Needs:   . Film/video editor (Medical):   Marland Kitchen Lack of Transportation (Non-Medical):   Physical Activity:   . Days of Exercise per Week:   . Minutes of Exercise per Session:   Stress:   . Feeling of Stress :   Social Connections:   . Frequency of Communication with Friends and Family:   . Frequency of Social Gatherings with Friends and Family:   . Attends Religious Services:   . Active Member of Clubs or Organizations:   . Attends Archivist Meetings:   Marland Kitchen Marital Status:   Intimate Partner Violence:   . Fear of Current or Ex-Partner:   . Emotionally Abused:   Marland Kitchen Physically Abused:   . Sexually Abused:     Outpatient Medications Prior to Visit  Medication Sig Dispense Refill  . acetaminophen (TYLENOL) 325 MG tablet Take 2 tablets (650 mg total) by mouth every 6 (six) hours as needed for mild pain, fever or headache. 60 tablet 1  . albuterol (PROVENTIL) (2.5 MG/3ML) 0.083% nebulizer solution Take 3 mLs (2.5 mg total) by nebulization every 4 (four) hours as needed for wheezing or shortness of breath. 75 mL 3  . amLODipine (NORVASC) 5 MG tablet Take 1 tablet (5 mg total) by mouth daily. 30 tablet 3  . carvedilol (COREG) 6.25 MG tablet TAKE 1 TABLET (6.25 MG TOTAL) BY MOUTH 2 (TWO) TIMES DAILY WITH A MEAL. 180 tablet 3  . clopidogrel (PLAVIX) 75 MG tablet TAKE 1 TABLET (75 MG TOTAL) BY MOUTH DAILY. 90 tablet 3  . docusate sodium (STOOL SOFTENER) 100 MG capsule Take 1 capsule (100 mg total) by  mouth 2 (two) times daily as needed for mild constipation. 30 capsule 0  . ELIQUIS 2.5 MG TABS tablet TAKE 1 TABLET BY MOUTH TWICE A DAY *EMERGENCY SUPPLY* 6 tablet 0  . Fluticasone-Umeclidin-Vilant (TRELEGY ELLIPTA) 100-62.5-25 MCG/INH AEPB Inhale 1 puff into the lungs daily. 60 each 3  . furosemide (LASIX) 40 MG tablet TAKE 1 TABLET BY MOUTH EVERY DAY (Patient  taking differently: Take 40 mg by mouth 2 (two) times daily. ) 90 tablet 2  . glucose blood (FREESTYLE LITE) test strip 1 each by Other route as needed. Use as instructed     . Lancets (FREESTYLE) lancets 1 each by Other route as needed. Use as instructed     . levalbuterol (XOPENEX HFA) 45 MCG/ACT inhaler Inhale 2 puffs into the lungs every 4 (four) hours as needed for shortness of breath. 1 Inhaler 12  . levalbuterol (XOPENEX) 0.63 MG/3ML nebulizer solution Take 3 mLs (0.63 mg total) by nebulization every 6 (six) hours as needed for shortness of breath. 120 mL 5  . Multiple Vitamins-Minerals (ICAPS AREDS 2 PO) Take 1 capsule by mouth daily.    . nitroGLYCERIN (NITROSTAT) 0.4 MG SL tablet Place 1 tablet (0.4 mg total) under the tongue every 5 (five) minutes x 3 doses as needed for chest pain (if no relief after 3rd dose, proceed to the ED for an evaluation). 75 tablet 1  . pantoprazole (PROTONIX) 40 MG tablet TAKE 1 TABLET BY MOUTH EVERY DAY 90 tablet 1  . Polyethyl Glycol-Propyl Glycol (LUBRICANT EYE DROPS) 0.4-0.3 % SOLN Place 1-2 drops into both eyes 3 (three) times daily as needed (dry/irritated eyes.).    Marland Kitchen rosuvastatin (CRESTOR) 20 MG tablet TAKE 1 TABLET BY MOUTH EVERY DAY 90 tablet 0  . sitaGLIPtin (JANUVIA) 50 MG tablet Take 1 tablet (50 mg total) by mouth daily. 90 tablet 3  . spironolactone (ALDACTONE) 25 MG tablet TAKE 1 TABLET BY MOUTH EVERY DAY *EMERGENCY SUPPLY* 3 tablet 0  . traMADol (ULTRAM) 50 MG tablet Take 1 tablet (50 mg total) by mouth every 6 (six) hours as needed. 1-2 every 6 h prn pain 120 tablet 5  . amLODipine  (NORVASC) 5 MG tablet Take 1 tablet (5 mg total) by mouth daily. 90 tablet 3   No facility-administered medications prior to visit.    Allergies  Allergen Reactions  . Codeine   . Morphine     REACTION: does not like    ROS Review of Systems  Constitutional: Negative for fatigue and unexpected weight change.  Eyes: Negative for visual disturbance.  Respiratory: Negative for cough, chest tightness and shortness of breath.   Cardiovascular: Negative for chest pain, palpitations and leg swelling.  Endocrine: Negative for polydipsia and polyuria.  Neurological: Negative for dizziness, syncope, weakness, light-headedness and headaches.      Objective:    Physical Exam Constitutional:      General: He is not in acute distress.    Appearance: He is well-developed. He is not ill-appearing.  HENT:     Right Ear: External ear normal.     Left Ear: External ear normal.  Eyes:     Pupils: Pupils are equal, round, and reactive to light.  Neck:     Thyroid: No thyromegaly.  Cardiovascular:     Rate and Rhythm: Normal rate.  Pulmonary:     Effort: Pulmonary effort is normal. No respiratory distress.     Breath sounds: Normal breath sounds. No wheezing or rales.  Musculoskeletal:     Cervical back: Neck supple.     Right lower leg: No edema.     Left lower leg: No edema.  Neurological:     Mental Status: He is alert and oriented to person, place, and time.     BP 120/62 (BP Location: Left Arm, Patient Position: Sitting, Cuff Size: Normal)   Pulse 64   Temp 97.9 F (36.6  C) (Oral)   Wt 180 lb 4.8 oz (81.8 kg)   SpO2 98%   BMI 26.63 kg/m  Wt Readings from Last 3 Encounters:  04/28/20 180 lb 4.8 oz (81.8 kg)  04/02/20 179 lb (81.2 kg)  03/11/20 176 lb 9.6 oz (80.1 kg)     Health Maintenance Due  Topic Date Due  . URINE MICROALBUMIN  09/28/2007  . OPHTHALMOLOGY EXAM  03/11/2017  . FOOT EXAM  02/08/2018    There are no preventive care reminders to display for this  patient.  Lab Results  Component Value Date   TSH 3.82 01/14/2020   Lab Results  Component Value Date   WBC 10.1 04/02/2020   HGB 14.2 04/02/2020   HCT 41.6 04/02/2020   MCV 90.5 04/02/2020   PLT 158.0 04/02/2020   Lab Results  Component Value Date   NA 135 01/14/2020   K 4.2 01/14/2020   CO2 29 01/14/2020   GLUCOSE 215 (H) 01/14/2020   BUN 24 (H) 01/14/2020   CREATININE 1.98 (H) 01/14/2020   BILITOT 1.3 (H) 03/14/2019   ALKPHOS 79 03/14/2019   AST 11 03/14/2019   ALT 16 03/14/2019   PROT 7.2 03/14/2019   ALBUMIN 4.4 03/14/2019   CALCIUM 9.8 01/14/2020   ANIONGAP 13 07/28/2019   GFR 32.30 (L) 01/14/2020   Lab Results  Component Value Date   CHOL 109 01/14/2020   Lab Results  Component Value Date   HDL 26.50 (L) 01/14/2020   Lab Results  Component Value Date   LDLCALC 56 01/14/2020   Lab Results  Component Value Date   TRIG 132.0 01/14/2020   Lab Results  Component Value Date   CHOLHDL 4 01/14/2020   Lab Results  Component Value Date   HGBA1C 7.9 (A) 04/28/2020      Assessment & Plan:   #1 type 2 diabetes.  Slightly worsened with A1c of 7.9%.  He remains very reluctant to add additional medications.  He does have fairly high glycemic food intake based on history today  -We discussed nutrition referral but he declines -We discussed freestyle libre if we can get this covered by insurance and he does have interested in that and helping to monitor -Spent considerable time discussing dietary factors.  Reduce high glycemic foods such as watermelon corn -He prefers to give this 60-month follow-up and if not improving at that point consider long-acting insulin  #2 chronic kidney disease stage III  #3 hypertension stable and at goal  No orders of the defined types were placed in this encounter.   Follow-up: Return in about 3 months (around 07/29/2020).    Carolann Littler, MD

## 2020-04-28 NOTE — Patient Instructions (Signed)

## 2020-04-30 ENCOUNTER — Other Ambulatory Visit: Payer: Self-pay

## 2020-04-30 ENCOUNTER — Encounter: Payer: Self-pay | Admitting: Primary Care

## 2020-04-30 ENCOUNTER — Ambulatory Visit (INDEPENDENT_AMBULATORY_CARE_PROVIDER_SITE_OTHER): Payer: Medicare PPO | Admitting: Primary Care

## 2020-04-30 DIAGNOSIS — J449 Chronic obstructive pulmonary disease, unspecified: Secondary | ICD-10-CM | POA: Diagnosis not present

## 2020-04-30 NOTE — Progress Notes (Signed)
Virtual Visit via Telephone Note  I connected with [name] on 04/30/20 at  9:30 AM EDT by telephone and verified that I am speaking with the correct person using two identifiers.  Location: Patient: Home Provider: Office   I discussed the limitations, risks, security and privacy concerns of performing an evaluation and management service by telephone and the availability of in person appointments. I also discussed with the patient that there may be a patient responsible charge related to this service. The patient expressed understanding and agreed to proceed.   History of Present Illness: 86 year old male, smoker quit 1973 (2.5-pack-year history).  Past medical history significant for chronic systolic heart failure, hypertension, left bundle branch block, proximal A. fib, coronary artery disease, GERD, type 2 diabetes.  Patient of Dr. Shearon Stalls, seen for initial consult March 2021.  Previous [facility] pulmonary encounter: 01/08/20- Dr. Shearon Stalls, Consult  Shortness of breath progressing over the last 2 years, progressing over the last 6 months.  Shortness of breath at rest and with exertion and talking. Breathing improves with rest. He has difficulty describing the pain - feels like a mix of burning tightness and pressure.  Now it is getting harder to talk. History of CABG over 10 years ago, has had multiple stents place most recently in 2016.  Has had cardioversion for A. Fib as recently as Jan 2021 which did not work. He is on eliquis. He can sometimes feel when he is going in and out of A. Fib.  Hospitalized for heart failure exacerbation in October 2020 and treated for pneumonia/bronchitis as well.  Has been prescribed albuterol inhaler in the past and it does help his breathing but has no impact on his chest tightness and burning. He does have heart burn and has difficulty distinguishing heart burn and chest pain and this is also folded in with his trouble breathing. He does take pantoprazole.  He also  has a neublizer machine and it helps for 1-2 hours. No orthopnea or PND. Wife denies any witnessed apneas, he has no excessive daytime sleepiness.  Sitting here in the chair he is short of breath with a respiratory rate in the 20s and appears to be hyperventilating. Patient was given trial of Xopenex inhaler and nebulizer solution  04/02/2020 Presents today for a follow-up visit with pulmonary function test. Reports shortness of breath with any activity for the last 2 years. He may have some nocturnal wheezing. He has a chronic haking cough which is non-productive. States that levalbuterol helps for a little while but does not last long. He does not use his rescue inhaler or nebulizer on a regular basis. He follows closely with cardiology every 3 months, next appointment is in July.   Pulmonary function testing today showed moderate obstructive lung disease with moderate restriction.  There was bronchodilator response.  Lung capacity diminished 79%.  DLCO also reduced at 63%  He has a light smoking hx. He smoked 1/3 pack daily from age [age]. He quit smoking at 29 or 86 year old.    04/30/2020 -interim history Patient contact today for virtual telephone visit for a 6-week follow-up.  During last office visit we checked a chest x-ray which showed no acute cardiopulmonary process, mild cardiac enlargement.  No evidence of airspace consolidation or pleural effusion/edema.  His alpha-1 level was normal, BNP was elevated at 359 and Eosinophil absolute were 200.   He feels his breathing is better since starting Trelegy Ellipta and request a refill in June for this medication.  He has not needed to use albuterol/xopenex much. Feels he is on the right track. He was outside this morning riding his lawn mower. He continues lasix 40mg  daily. He will be seeing cardiology in two days.   States that the Trelegy medication is expensive, it cost him 40-45 dollars. He does not think he would qualify for patient  assistance. He can get samples when in office.    Observations/Objective:  - Patient appears well; no overt shortness of breath, wheezing or cough - O2 98-99% per patient   PFT- FVC 2.26 (62%), FEV1 1.53 (60%), ratio 68%, TLC 79%, DLCO 14.59 (63%) Moderate obstruction with moderate restriction; positive bronchodilator response; decreased diffusion capacity   Assessment and Plan:  COPD: - Former smoker; alpha -1 WNL phenotype PI*MM - Pulmonary function testing showed mixed disease with +BD response - Breathing has improved on Trelegy Ellipta 100 - Continue Trelegy one puff daily in the morning (rinse mouth after use) - CXR in June showed no active cardiopulmonary process  Follow Up Instructions:   - FU in 2-3 months with Dr. Shearon Stalls  I discussed the assessment and treatment plan with the patient. The patient was provided an opportunity to ask questions and all were answered. The patient agreed with the plan and demonstrated an understanding of the instructions.   The patient was advised to call back or seek an in-person evaluation if the symptoms worsen or if the condition fails to improve as anticipated.  I provided 15 minutes of non-face-to-face time during this encounter.   Martyn Ehrich, NP

## 2020-04-30 NOTE — Patient Instructions (Signed)
COPD: - Continue Trelegy one puff daily in the morning (rinse mouth after use) - CXR in June showed no active cardiopulmonary process  Follow Up Instructions:  - FU in 2-3 months with Dr. Shearon Stalls or sooner if needed (we can give you trelegy samples at this visit if we have them)

## 2020-05-01 NOTE — Progress Notes (Signed)
Cardiology Office Note   Date:  05/02/2020   ID:  [name], DOB 1933-09-02, MRN [id number]  PCP:  [name], MD  Cardiologist:   [name], MD  Chief Complaint  Patient presents with  . Shortness of Breath      History of Present Illness: [name] is a 86 y.o. male who presents for followup of CAD.   He had chest pain and had a cardiac catheterization on 04/01/2015 which showed a 95% in-stent restenosis in SVG to OM, treated with 2.75 x 24 mm Synergy DES postdilated to 3.3 mm. He had patent LIMA to LAD, patent SVG to posterolateral, patent SVG to PDA.    Of note he did have atrial fibrillation with rate control on presentation but he converted spontaneously. He was not started on warfarin because he was taking aspirin and Plavix.   He was having SOB in Dec 2018.  On [place] he had a fixed inferior defect.  CPX testing suggested a mixed restrictive/obstructive pattern of PFTs.  He was not thought to have a cardiovascular limitation.  He has been in atrial fib.  I had him wear a Holter and he had 100% atrial fib on this monitor.  He had a cardioversion.  However, he was back in atrial fibrillation .  He had continued dyspnea.  I sent him to pulmonary and I reviewed their notes for this visit.  He is scheduled to have pulmonary function testing and was found to have moderate COPD.   He was treated with Trelegy Ellipta and reported improvement in his symptoms with a pulmonary appt two days.  He had some increased fluid and I gave him some increased Lasix at the last visit.  He still says that he short of breath with activities but not at rest.  I do note that his BNP was mildly elevated to 359.  Is typically around 200 or so.  However, he does still eat salt.  He does not drink excessive fluid.   Past Medical History:  Diagnosis Date  . Arthritis   . CAD (coronary artery disease)    a. Cath September 2015 LIMA to the LAD patent, SVG to PDA patent, SVG to  posterior lateral patent, SVG to OM with a 90% in-stent restenosis at an anastomotic lesion. This was treated with angioplasty. b. cath 04/01/2015 95% ISR in SVG to OM treated with 2.75x24 Synergy DES postdilated to 3.66mm, all other grafts patent  . Cataract    surgery,B/L  . CHF (congestive heart failure) ([place])   . Chronic kidney disease    nephrolithiasis  . Diabetes mellitus    TYPE 2  . GERD (gastroesophageal reflux disease)   . Heart murmur   . Hernia   . Hypercholesterolemia   . Hypertension   . Incontinence    hx  over 1 year,leaks without awareness  . Other and unspecified diseases of appendix   . PAF (paroxysmal atrial fibrillation) ([place])    in the setting of ischemia on 03/31/2015  . Pancreatitis   . Prostate CA ([place]) 03/01/04   prostate bx=Adenocarcinoam,gleason 3+4=7,PSA=6.75  . Shortness of breath dyspnea   . Ulcer    hx gastric    Past Surgical History:  Procedure Laterality Date  . APPENDECTOMY    . BACK SURGERY    . CARDIAC CATHETERIZATION N/A 04/01/2015   Procedure: Left Heart Cath and Cors/Grafts Angiography;  Surgeon: Jettie Booze, MD;  Location: [facility];  Service: Cardiovascular;  Laterality: N/A;  . CARDIAC CATHETERIZATION N/A 04/01/2015   Procedure: Coronary Stent Intervention;  Surgeon: Jettie Booze, MD;  Location: [facility];  Service: Cardiovascular;  Laterality: N/A;  . CARDIOVERSION N/A 11/01/2019   Procedure: CARDIOVERSION;  Surgeon: Geralynn Rile, MD;  Location: [facility];  Service: Cardiovascular;  Laterality: N/A;  . CARPAL TUNNEL RELEASE  2004   both hands  . CORONARY ARTERY BYPASS GRAFT    . CYSTOSCOPY  08/23/12   incomplete emoptying bladder  . HERNIA REPAIR    . incision and drainage of right chest abscess    . LAPAROSCOPIC CHOLECYSTECTOMY  09/2009  . LEFT HEART CATHETERIZATION WITH CORONARY ANGIOGRAM N/A 07/19/2014   Procedure: LEFT HEART CATHETERIZATION WITH CORONARY ANGIOGRAM;  Surgeon: Leonie Man, MD;  Location: [facility];  Service: Cardiovascular;  Laterality: N/A;  . RECTAL SURGERY    . ROBOT ASSISTED LAPAROSCOPIC RADICAL PROSTATECTOMY  2005     Current Outpatient Medications  Medication Sig Dispense Refill  . acetaminophen (TYLENOL) 325 MG tablet Take 2 tablets (650 mg total) by mouth every 6 (six) hours as needed for mild pain, fever or headache. 60 tablet 1  . albuterol (PROVENTIL) (2.5 MG/3ML) 0.083% nebulizer solution Take 3 mLs (2.5 mg total) by nebulization every 4 (four) hours as needed for wheezing or shortness of breath. 75 mL 3  . amLODipine (NORVASC) 5 MG tablet Take 1 tablet (5 mg total) by mouth daily. 30 tablet 3  . carvedilol (COREG) 6.25 MG tablet TAKE 1 TABLET (6.25 MG TOTAL) BY MOUTH 2 (TWO) TIMES DAILY WITH A MEAL. 180 tablet 3  . clopidogrel (PLAVIX) 75 MG tablet TAKE 1 TABLET (75 MG TOTAL) BY MOUTH DAILY. 90 tablet 3  . Continuous Blood Gluc Receiver (FREESTYLE LIBRE 14 DAY READER) DEVI 1 Device by Does not apply route every 14 (fourteen) days. 2 each 3  . Continuous Blood Gluc Sensor (FREESTYLE LIBRE 14 DAY SENSOR) MISC 1 Device by Does not apply route every 14 (fourteen) days. 2 each 3  . docusate sodium (STOOL SOFTENER) 100 MG capsule Take 1 capsule (100 mg total) by mouth 2 (two) times daily as needed for mild constipation. 30 capsule 0  . ELIQUIS 2.5 MG TABS tablet TAKE 1 TABLET BY MOUTH TWICE A DAY *EMERGENCY SUPPLY* 6 tablet 0  . Fluticasone-Umeclidin-Vilant (TRELEGY ELLIPTA) 100-62.5-25 MCG/INH AEPB Inhale 1 puff into the lungs daily. 60 each 3  . furosemide (LASIX) 40 MG tablet TAKE 1 TABLET BY MOUTH EVERY DAY (Patient taking differently: Take 40 mg by mouth 2 (two) times daily. ) 90 tablet 2  . glucose blood (FREESTYLE LITE) test strip 1 each by Other route as needed. Use as instructed     . Lancets (FREESTYLE) lancets 1 each by Other route as needed. Use as instructed     . levalbuterol (XOPENEX HFA) 45 MCG/ACT inhaler Inhale 2 puffs into the  lungs every 4 (four) hours as needed for shortness of breath. 1 Inhaler 12  . levalbuterol (XOPENEX) 0.63 MG/3ML nebulizer solution Take 3 mLs (0.63 mg total) by nebulization every 6 (six) hours as needed for shortness of breath. 120 mL 5  . Multiple Vitamins-Minerals (ICAPS AREDS 2 PO) Take 1 capsule by mouth daily.    . nitroGLYCERIN (NITROSTAT) 0.4 MG SL tablet Place 1 tablet (0.4 mg total) under the tongue every 5 (five) minutes x 3 doses as needed for chest pain (if no relief after 3rd dose, proceed to the  ED for an evaluation). 75 tablet 1  . pantoprazole (PROTONIX) 40 MG tablet TAKE 1 TABLET BY MOUTH EVERY DAY 90 tablet 1  . Polyethyl Glycol-Propyl Glycol (LUBRICANT EYE DROPS) 0.4-0.3 % SOLN Place 1-2 drops into both eyes 3 (three) times daily as needed (dry/irritated eyes.).    Marland Kitchen rosuvastatin (CRESTOR) 20 MG tablet TAKE 1 TABLET BY MOUTH EVERY DAY 90 tablet 0  . sitaGLIPtin (JANUVIA) 50 MG tablet Take 1 tablet (50 mg total) by mouth daily. 90 tablet 3  . spironolactone (ALDACTONE) 25 MG tablet TAKE 1 TABLET BY MOUTH EVERY DAY *EMERGENCY SUPPLY* 3 tablet 0  . traMADol (ULTRAM) 50 MG tablet Take 1 tablet (50 mg total) by mouth every 6 (six) hours as needed. 1-2 every 6 h prn pain 120 tablet 5   No current facility-administered medications for this visit.    Allergies:   Codeine and Morphine    ROS:  Please see the history of present illness.   Otherwise, review of systems are positive for none.   All other systems are reviewed and negative.    PHYSICAL EXAM: VS:  BP 130/71   Pulse 68   Temp (!) 97 F (36.1 C)   Ht 5\' 9"  (1.753 m)   Wt 180 lb (81.6 kg)   SpO2 98%   BMI 26.58 kg/m  , BMI Body mass index is 26.58 kg/m. GENERAL:  Well appearing NECK:  No jugular venous distention, waveform within normal limits, carotid upstroke brisk and symmetric, no bruits, no thyromegaly LUNGS:  Clear to auscultation bilaterally CHEST:  Well healed sternotomy scar. HEART:  PMI not displaced or  sustained,S1 and S2 within normal limits, no S3, no clicks, no rubs, 2 out of 6 apical systolic murmur radiating slightly at the aortic outflow tract, no diastolic murmurs, irregular ABD:  Flat, positive bowel sounds normal in frequency in pitch, no bruits, no rebound, no guarding, no midline pulsatile mass, no hepatomegaly, no splenomegaly EXT:  2 plus pulses throughout, no edema, no cyanosis no clubbing   EKG:  EKG is  not ordered today.   Recent Labs: 12/27/2019: BNP 234.3 01/14/2020: BUN 24; Creatinine, Ser 1.98; Potassium 4.2; Sodium 135; TSH 3.82 04/02/2020: Hemoglobin 14.2; Platelets 158.0; Pro B Natriuretic peptide (BNP) 359.0    Lipid Panel    Component Value Date/Time   CHOL 109 01/14/2020 0854   TRIG 132.0 01/14/2020 0854   TRIG 306 (HH) 09/27/2006 1154   HDL 26.50 (L) 01/14/2020 0854   CHOLHDL 4 01/14/2020 0854   VLDL 26.4 01/14/2020 0854   LDLCALC 56 01/14/2020 0854   LDLDIRECT 143.3 11/23/2007 0000      Wt Readings from Last 3 Encounters:  05/02/20 180 lb (81.6 kg)  04/28/20 180 lb 4.8 oz (81.8 kg)  04/02/20 179 lb (81.2 kg)      Other studies Reviewed: Additional studies/ records that were reviewed today include: PFTs and pulmonary note. Labs Review of the above records demonstrates: See elsewhere  ASSESSMENT AND PLAN:  CAD  The patient has no new sypmtoms.  No further cardiovascular testing is indicated.  We will continue with aggressive risk reduction and meds as listed.he had a negative perfusion study in 2018.   Atrial fib He is post DCCV.  However, he did not maintain sinus rhythm.  I do not think his fibrillation is leading to his symptoms.  At this point we will continue with rate control and anticoagulation.   Hypertension  BP is at target.  No change in therapy.  AS:   This was mild on echo in October 2020.    Carotid stenosis-Bilateral  He has moderate left disease in June demonstrated left stenosis 60 - 79% and right 1 - 39% last month.  I  will follow this in June of next year.  CKD: Creatinine most recently was 1.98.  I do not want to increase his diuretic.  I do not think he is significantly volume overloaded.   Chronic systolic and diastolic HF:  He does have severe LVH on his echo.  He had many conversations about salt and fluid but I had the same conversation again the day.  He needs to reduce his salt.  No change in therapy.   Covid 19 education: He has received both of his vaccine shots.   Current medicines are reviewed at length with the patient today.  The patient does not have concerns regarding medicines.  The following changes have been made:   None  Labs/ tests ordered today include: None  No orders of the defined types were placed in this encounter.    Disposition:   FU with me in 6 months   Signed, [name], MD  05/02/2020 9:21 AM    [place]

## 2020-05-02 ENCOUNTER — Ambulatory Visit: Payer: Medicare PPO | Admitting: Cardiology

## 2020-05-02 ENCOUNTER — Other Ambulatory Visit: Payer: Self-pay

## 2020-05-02 ENCOUNTER — Encounter: Payer: Self-pay | Admitting: Cardiology

## 2020-05-02 VITALS — BP 130/71 | HR 68 | Temp 97.0°F | Ht 69.0 in | Wt 180.0 lb

## 2020-05-02 DIAGNOSIS — I6523 Occlusion and stenosis of bilateral carotid arteries: Secondary | ICD-10-CM

## 2020-05-02 DIAGNOSIS — I25118 Atherosclerotic heart disease of native coronary artery with other forms of angina pectoris: Secondary | ICD-10-CM

## 2020-05-02 DIAGNOSIS — I255 Ischemic cardiomyopathy: Secondary | ICD-10-CM | POA: Diagnosis not present

## 2020-05-02 DIAGNOSIS — I480 Paroxysmal atrial fibrillation: Secondary | ICD-10-CM

## 2020-05-02 DIAGNOSIS — I2510 Atherosclerotic heart disease of native coronary artery without angina pectoris: Secondary | ICD-10-CM

## 2020-05-02 NOTE — Patient Instructions (Signed)
Medication Instructions:  The current medical regimen is effective;  continue present plan and medications.  *If you need a refill on your cardiac medications before your next appointment, please call your pharmacy*  Follow-Up: At [facility], you and your health needs are our priority.  As part of our continuing mission to provide you with exceptional heart care, we have created designated Provider Care Teams.  These Care Teams include your primary Cardiologist (physician) and Advanced Practice Providers (APPs -  Physician Assistants and Nurse Practitioners) who all work together to provide you with the care you need, when you need it.  We recommend signing up for the patient portal called "MyChart".  Sign up information is provided on this After Visit Summary.  MyChart is used to connect with patients for Virtual Visits (Telemedicine).  Patients are able to view lab/test results, encounter notes, upcoming appointments, etc.  Non-urgent messages can be sent to your provider as well.   To learn more about what you can do with MyChart, go to [email or web address].    Your next appointment:   6 month(s)  The format for your next appointment:   In Person  Provider:   James Hochrein, MD  

## 2020-05-11 ENCOUNTER — Other Ambulatory Visit: Payer: Self-pay | Admitting: Cardiology

## 2020-05-15 ENCOUNTER — Other Ambulatory Visit: Payer: Self-pay | Admitting: Family Medicine

## 2020-05-16 NOTE — Telephone Encounter (Signed)
Please advise if okay to fill  

## 2020-05-18 NOTE — Telephone Encounter (Signed)
Refill for 6 months. 

## 2020-06-13 ENCOUNTER — Other Ambulatory Visit: Payer: Self-pay | Admitting: Family Medicine

## 2020-07-03 NOTE — Progress Notes (Signed)
Needs follow up with me in the next month or two.

## 2020-07-04 NOTE — Telephone Encounter (Signed)
Called and spoke with patient's wife, [name] Alcon, listed on DPR, patient scheduled for f/u with Dr. Shearon Stalls.  Nothing further needed.

## 2020-07-09 ENCOUNTER — Other Ambulatory Visit: Payer: Self-pay | Admitting: Cardiology

## 2020-07-10 ENCOUNTER — Other Ambulatory Visit: Payer: Self-pay | Admitting: Cardiology

## 2020-07-10 MED ORDER — FUROSEMIDE 40 MG PO TABS: 40.0000 mg | ORAL_TABLET | Freq: Every day | ORAL | 2 refills | Status: DC

## 2020-07-10 NOTE — Telephone Encounter (Signed)
Refill sent to pharmacy.   

## 2020-07-10 NOTE — Telephone Encounter (Signed)
*  STAT* If patient is at the pharmacy, call can be transferred to refill team.   1. Which medications need to be refilled? (please list name of each medication and dose if known)  furosemide (LASIX) 40 MG tablet  2x daily   2. Which pharmacy/location (including street and city if local pharmacy) is medication to be sent to? [id number] - [place], [place]  3. Do they need a 30 day or 90 day supply? 79   Wife of the patient states the patient takes this medication 2x daily. The rx on file at the pharmacy is only for 1x daily and so the patient runs out of the medication faster.Please update the rx in the patient's chart.   The patient is out of medication

## 2020-07-22 ENCOUNTER — Other Ambulatory Visit: Payer: Self-pay | Admitting: Family Medicine

## 2020-07-29 ENCOUNTER — Encounter: Payer: Self-pay | Admitting: Family Medicine

## 2020-07-29 ENCOUNTER — Ambulatory Visit: Payer: Medicare PPO | Admitting: Family Medicine

## 2020-07-29 ENCOUNTER — Other Ambulatory Visit: Payer: Self-pay

## 2020-07-29 VITALS — BP 124/76 | HR 71 | Temp 97.7°F | Ht 69.0 in | Wt 179.8 lb

## 2020-07-29 DIAGNOSIS — E1121 Type 2 diabetes mellitus with diabetic nephropathy: Secondary | ICD-10-CM

## 2020-07-29 DIAGNOSIS — I159 Secondary hypertension, unspecified: Secondary | ICD-10-CM | POA: Diagnosis not present

## 2020-07-29 DIAGNOSIS — N184 Chronic kidney disease, stage 4 (severe): Secondary | ICD-10-CM

## 2020-07-29 DIAGNOSIS — Z23 Encounter for immunization: Secondary | ICD-10-CM | POA: Diagnosis not present

## 2020-07-29 LAB — POCT GLYCOSYLATED HEMOGLOBIN (HGB A1C): Hemoglobin A1C: 7.5 % — AB (ref 4.0–5.6)

## 2020-07-29 NOTE — Patient Instructions (Signed)
° ° ° °  If you have lab work done today you will be contacted with your lab results within the next 2 weeks.  If you have not heard from us then please contact us. The fastest way to get your results is to register for My Chart. ° ° °IF you received an x-ray today, you will receive an invoice from [facility]. Please contact [facility] at [phone] with questions or concerns regarding your invoice.  ° °IF you received labwork today, you will receive an invoice from [facility]. Please contact [facility] at [phone] with questions or concerns regarding your invoice.  ° °Our billing staff will not be able to assist you with questions regarding bills from these companies. ° °You will be contacted with the lab results as soon as they are available. The fastest way to get your results is to activate your My Chart account. Instructions are located on the last page of this paperwork. If you have not heard from us regarding the results in 2 weeks, please contact this office. °  ° ° ° °

## 2020-07-29 NOTE — Progress Notes (Signed)
Established Patient Office Visit  Subjective:  Patient ID: [name], male    DOB: 29-Jun-1933  Age: 86 y.o. MRN: [id number]  CC:  Chief Complaint  Patient presents with  . Diabetes    having pain in the feet when walking, nothing major.     HPI [name] presents for medical follow-up.  He has multiple chronic problems including history of diastolic heart failure, peripheral vascular disease, CAD, systolic heart failure, hypertension, chronic kidney disease, type 2 diabetes, history of atrial fibrillation, COPD.  He is followed by the [facility] and brings in copy of labs from there.  His renal function is stable with recent creatinine 1.93.  He had TSH of 5.6.  Regular urinalysis unremarkable.  Urine microalbumin to creatinine ratio elevated 62.4.  Electrolytes were stable.  Liver enzymes stable.  Cholesterol 132 with LDL cholesterol 57.  Triglycerides 175.  He had A1c back in August at the [place] of 8.3%.  He has made some positive dietary changes since then and A1c here today 7.5%.  Still consuming some high sugar things like occasional sodas and usually about 6 ounces of cranberry juice per day.  Also eats ice cream most days.  He does describe some bilateral leg pain.  This is pain at rest and not claudication type symptoms.  He states this is an achy pain but relatively mild in intensity.  He has some kind of over-the-counter topical spray that he uses it seems to help some.  Denies any muscle cramps  Past Medical History:  Diagnosis Date  . Arthritis   . CAD (coronary artery disease)    a. Cath September 2015 LIMA to the LAD patent, SVG to PDA patent, SVG to posterior lateral patent, SVG to OM with a 90% in-stent restenosis at an anastomotic lesion. This was treated with angioplasty. b. cath 04/01/2015 95% ISR in SVG to OM treated with 2.75x24 Synergy DES postdilated to 3.99mm, all other grafts patent  . Cataract    surgery,B/L  . CHF (congestive heart failure) ([place])   . Chronic kidney  disease    nephrolithiasis  . Diabetes mellitus    TYPE 2  . GERD (gastroesophageal reflux disease)   . Heart murmur   . Hernia   . Hypercholesterolemia   . Hypertension   . Incontinence    hx  over 1 year,leaks without awareness  . Other and unspecified diseases of appendix   . PAF (paroxysmal atrial fibrillation) ([place])    in the setting of ischemia on 03/31/2015  . Pancreatitis   . Prostate CA ([place]) 03/01/04   prostate bx=Adenocarcinoam,gleason 3+4=7,PSA=6.75  . Shortness of breath dyspnea   . Ulcer    hx gastric    Past Surgical History:  Procedure Laterality Date  . APPENDECTOMY    . BACK SURGERY    . CARDIAC CATHETERIZATION N/A 04/01/2015   Procedure: Left Heart Cath and Cors/Grafts Angiography;  Surgeon: Jettie Booze, MD;  Location: [facility];  Service: Cardiovascular;  Laterality: N/A;  . CARDIAC CATHETERIZATION N/A 04/01/2015   Procedure: Coronary Stent Intervention;  Surgeon: Jettie Booze, MD;  Location: [place] [facility];  Service: Cardiovascular;  Laterality: N/A;  . CARDIOVERSION N/A 11/01/2019   Procedure: CARDIOVERSION;  Surgeon: Geralynn Rile, MD;  Location: [facility];  Service: Cardiovascular;  Laterality: N/A;  . CARPAL TUNNEL RELEASE  2004   both hands  . CORONARY ARTERY BYPASS GRAFT    . CYSTOSCOPY  08/23/12   incomplete  emoptying bladder  . HERNIA REPAIR    . incision and drainage of right chest abscess    . LAPAROSCOPIC CHOLECYSTECTOMY  09/2009  . LEFT HEART CATHETERIZATION WITH CORONARY ANGIOGRAM N/A 07/19/2014   Procedure: LEFT HEART CATHETERIZATION WITH CORONARY ANGIOGRAM;  Surgeon: Leonie Man, MD;  Location: [facility] CATH LAB;  Service: Cardiovascular;  Laterality: N/A;  . RECTAL SURGERY    . ROBOT ASSISTED LAPAROSCOPIC RADICAL PROSTATECTOMY  2005    Family History  Problem Relation Age of Onset  . Cancer Mother        stomach    Social History   Socioeconomic History  . Marital status: Married    Spouse name:  Not on file  . Number of children: 2  . Years of education: Not on file  . Highest education level: Not on file  Occupational History    Employer: RETIRED    Comment: Retired  Tobacco Use  . Smoking status: Former Smoker    Packs/day: 0.50    Years: 5.00    Pack years: 2.50    Types: Cigarettes    Quit date: 07/20/1972    Years since quitting: 48.0  . Smokeless tobacco: Never Used  . Tobacco comment: quit s  Vaping Use  . Vaping Use: Never used  Substance and Sexual Activity  . Alcohol use: No  . Drug use: No    Comment: quit smoking 40 years ago  . Sexual activity: Never  Other Topics Concern  . Not on file  Social History Narrative   Retired   Married      Social Determinants of Radio broadcast assistant Strain:   . Difficulty of Paying Living Expenses: Not on file  Food Insecurity:   . Worried About Charity fundraiser in the Last Year: Not on file  . Ran Out of Food in the Last Year: Not on file  Transportation Needs:   . Lack of Transportation (Medical): Not on file  . Lack of Transportation (Non-Medical): Not on file  Physical Activity:   . Days of Exercise per Week: Not on file  . Minutes of Exercise per Session: Not on file  Stress:   . Feeling of Stress : Not on file  Social Connections:   . Frequency of Communication with Friends and Family: Not on file  . Frequency of Social Gatherings with Friends and Family: Not on file  . Attends Religious Services: Not on file  . Active Member of Clubs or Organizations: Not on file  . Attends Archivist Meetings: Not on file  . Marital Status: Not on file  Intimate Partner Violence:   . Fear of Current or Ex-Partner: Not on file  . Emotionally Abused: Not on file  . Physically Abused: Not on file  . Sexually Abused: Not on file    Outpatient Medications Prior to Visit  Medication Sig Dispense Refill  . acetaminophen (TYLENOL) 325 MG tablet Take 2 tablets (650 mg total) by mouth every 6 (six)  hours as needed for mild pain, fever or headache. 60 tablet 1  . albuterol (PROVENTIL) (2.5 MG/3ML) 0.083% nebulizer solution Take 3 mLs (2.5 mg total) by nebulization every 4 (four) hours as needed for wheezing or shortness of breath. 75 mL 3  . amLODipine (NORVASC) 5 MG tablet Take 1 tablet (5 mg total) by mouth daily. 30 tablet 3  . carvedilol (COREG) 6.25 MG tablet TAKE 1 TABLET (6.25 MG TOTAL) BY MOUTH 2 (TWO) TIMES DAILY WITH  A MEAL. 180 tablet 3  . clopidogrel (PLAVIX) 75 MG tablet TAKE 1 TABLET (75 MG TOTAL) BY MOUTH DAILY. 90 tablet 3  . Continuous Blood Gluc Receiver (FREESTYLE LIBRE 14 DAY READER) DEVI 1 Device by Does not apply route every 14 (fourteen) days. 2 each 3  . Continuous Blood Gluc Sensor (FREESTYLE LIBRE 14 DAY SENSOR) MISC 1 Device by Does not apply route every 14 (fourteen) days. 2 each 3  . docusate sodium (STOOL SOFTENER) 100 MG capsule Take 1 capsule (100 mg total) by mouth 2 (two) times daily as needed for mild constipation. 30 capsule 0  . ELIQUIS 2.5 MG TABS tablet TAKE 1 TABLET BY MOUTH TWICE A DAY 180 tablet 1  . Fluticasone-Umeclidin-Vilant (TRELEGY ELLIPTA) 100-62.5-25 MCG/INH AEPB Inhale 1 puff into the lungs daily. 60 each 3  . furosemide (LASIX) 40 MG tablet Take 1 tablet (40 mg total) by mouth daily. 90 tablet 2  . glucose blood (FREESTYLE LITE) test strip 1 each by Other route as needed. Use as instructed     . Lancets (FREESTYLE) lancets 1 each by Other route as needed. Use as instructed     . levalbuterol (XOPENEX HFA) 45 MCG/ACT inhaler Inhale 2 puffs into the lungs every 4 (four) hours as needed for shortness of breath. 1 Inhaler 12  . levalbuterol (XOPENEX) 0.63 MG/3ML nebulizer solution Take 3 mLs (0.63 mg total) by nebulization every 6 (six) hours as needed for shortness of breath. 120 mL 5  . Multiple Vitamins-Minerals (ICAPS AREDS 2 PO) Take 1 capsule by mouth daily.    . nitroGLYCERIN (NITROSTAT) 0.4 MG SL tablet Place 1 tablet (0.4 mg total) under  the tongue every 5 (five) minutes x 3 doses as needed for chest pain (if no relief after 3rd dose, proceed to the ED for an evaluation). 75 tablet 1  . pantoprazole (PROTONIX) 40 MG tablet TAKE 1 TABLET BY MOUTH EVERY DAY 90 tablet 1  . Polyethyl Glycol-Propyl Glycol (LUBRICANT EYE DROPS) 0.4-0.3 % SOLN Place 1-2 drops into both eyes 3 (three) times daily as needed (dry/irritated eyes.).    Marland Kitchen rosuvastatin (CRESTOR) 20 MG tablet TAKE 1 TABLET BY MOUTH EVERY DAY 90 tablet 0  . sitaGLIPtin (JANUVIA) 50 MG tablet Take 1 tablet (50 mg total) by mouth daily. 90 tablet 3  . spironolactone (ALDACTONE) 25 MG tablet TAKE 1 TABLET BY MOUTH EVERY DAY 90 tablet 2  . traMADol (ULTRAM) 50 MG tablet Take 1 tablet (50 mg total) by mouth every 6 (six) hours as needed. 1-2 every 6 h prn pain 120 tablet 5   No facility-administered medications prior to visit.    Allergies  Allergen Reactions  . Codeine   . Morphine     REACTION: does not like    ROS Review of Systems  Constitutional: Negative for chills, fatigue and fever.  Eyes: Negative for visual disturbance.  Respiratory: Negative for cough, chest tightness and shortness of breath.   Cardiovascular: Negative for chest pain, palpitations and leg swelling.  Endocrine: Negative for polydipsia and polyuria.  Genitourinary: Negative for dysuria.  Neurological: Negative for dizziness, syncope, weakness, light-headedness and headaches.      Objective:    Physical Exam Vitals reviewed.  Constitutional:      Appearance: Normal appearance.  Cardiovascular:     Rate and Rhythm: Normal rate and regular rhythm.  Pulmonary:     Effort: Pulmonary effort is normal.     Breath sounds: Normal breath sounds.  Musculoskeletal:  Right lower leg: No edema.     Left lower leg: No edema.  Neurological:     Mental Status: He is alert.     BP 124/76   Pulse 71   Temp 97.7 F (36.5 C)   Ht 5\' 9"  (1.753 m)   Wt 179 lb 12.8 oz (81.6 kg)   SpO2 96%    BMI 26.55 kg/m  Wt Readings from Last 3 Encounters:  07/29/20 179 lb 12.8 oz (81.6 kg)  05/02/20 180 lb (81.6 kg)  04/28/20 180 lb 4.8 oz (81.8 kg)     Health Maintenance Due  Topic Date Due  . URINE MICROALBUMIN  09/28/2007  . OPHTHALMOLOGY EXAM  03/11/2017  . FOOT EXAM  02/08/2018    There are no preventive care reminders to display for this patient.  Lab Results  Component Value Date   TSH 3.82 01/14/2020   Lab Results  Component Value Date   WBC 10.1 04/02/2020   HGB 14.2 04/02/2020   HCT 41.6 04/02/2020   MCV 90.5 04/02/2020   PLT 158.0 04/02/2020   Lab Results  Component Value Date   NA 135 01/14/2020   K 4.2 01/14/2020   CO2 29 01/14/2020   GLUCOSE 215 (H) 01/14/2020   BUN 24 (H) 01/14/2020   CREATININE 1.98 (H) 01/14/2020   BILITOT 1.3 (H) 03/14/2019   ALKPHOS 79 03/14/2019   AST 11 03/14/2019   ALT 16 03/14/2019   PROT 7.2 03/14/2019   ALBUMIN 4.4 03/14/2019   CALCIUM 9.8 01/14/2020   ANIONGAP 13 07/28/2019   GFR 32.30 (L) 01/14/2020   Lab Results  Component Value Date   CHOL 109 01/14/2020   Lab Results  Component Value Date   HDL 26.50 (L) 01/14/2020   Lab Results  Component Value Date   LDLCALC 56 01/14/2020   Lab Results  Component Value Date   TRIG 132.0 01/14/2020   Lab Results  Component Value Date   CHOLHDL 4 01/14/2020   Lab Results  Component Value Date   HGBA1C 7.5 (A) 07/29/2020      Assessment & Plan:   Problem List Items Addressed This Visit      Unprioritized   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 ([place]) - Primary (Chronic)   Relevant Orders   Microalbumin / creatinine urine ratio   POCT glycosylated hemoglobin (Hb A1C) (Completed)   Ambulatory referral to Ophthalmology   HTN (hypertension)   CKD (chronic kidney disease) stage 4, GFR 15-29 ml/min (HCC) (Chronic)    Other Visit Diagnoses    Need for prophylactic vaccination and inoculation against influenza       Relevant Orders    Flu Vaccine QUAD High Dose(Fluad) (Completed)    Diabetes slightly improved with A1c today 7.5% -Continue dietary modification.  He is declined insulin in the past.  Continue to modify diet with reducing high glycemic foods -Repeat urine microalbumin.  If elevated consider low-dose ARB or ACE but would have to follow kidney function closely -Flu vaccine given -Routine follow-up in 3 months and sooner as needed  No orders of the defined types were placed in this encounter.   Follow-up: Return in about 3 months (around 10/29/2020).    Carolann Littler, MD

## 2020-07-30 ENCOUNTER — Telehealth: Payer: Self-pay | Admitting: Family Medicine

## 2020-07-30 DIAGNOSIS — C61 Malignant neoplasm of prostate: Secondary | ICD-10-CM | POA: Diagnosis not present

## 2020-07-30 LAB — MICROALBUMIN / CREATININE URINE RATIO
Creatinine, Urine: 109 mg/dL (ref 20–320)
Microalb Creat Ratio: 27 mcg/mg creat (ref <30)
Microalb, Ur: 2.9 mg/dL

## 2020-07-30 MED ORDER — FUROSEMIDE 40 MG PO TABS: 40.0000 mg | ORAL_TABLET | Freq: Two times a day (BID) | ORAL | 3 refills | Status: DC

## 2020-07-30 MED ORDER — AMLODIPINE BESYLATE 5 MG PO TABS: 5.0000 mg | ORAL_TABLET | Freq: Every day | ORAL | 3 refills | Status: DC

## 2020-07-30 NOTE — Telephone Encounter (Signed)
Rx's sent in as requested. 

## 2020-07-30 NOTE — Telephone Encounter (Signed)
The patients wife called to request Rx refills for amLODipine (NORVASC) 5 MG tablet she wants to make sure that it is 5mg  and not 10mg    furosemide (LASIX) 40 MG tablet needs to be for 2 times a day and not 1 time a day  [id number] [address] Phone:  [phone]  Fax:  [phone]

## 2020-07-30 NOTE — Telephone Encounter (Signed)
Refill for 1 year.  Our list says 5 mg amlodipine

## 2020-07-30 NOTE — Telephone Encounter (Signed)
Okay to refill? 

## 2020-08-06 DIAGNOSIS — R9721 Rising PSA following treatment for malignant neoplasm of prostate: Secondary | ICD-10-CM | POA: Diagnosis not present

## 2020-08-06 DIAGNOSIS — C61 Malignant neoplasm of prostate: Secondary | ICD-10-CM | POA: Diagnosis not present

## 2020-08-06 DIAGNOSIS — R350 Frequency of micturition: Secondary | ICD-10-CM | POA: Diagnosis not present

## 2020-08-07 ENCOUNTER — Ambulatory Visit: Payer: Medicare PPO | Admitting: Internal Medicine

## 2020-08-07 ENCOUNTER — Other Ambulatory Visit: Payer: Self-pay

## 2020-08-07 ENCOUNTER — Encounter: Payer: Self-pay | Admitting: Internal Medicine

## 2020-08-07 VITALS — BP 120/60 | HR 72 | Temp 97.4°F | Ht 69.0 in | Wt 183.0 lb

## 2020-08-07 DIAGNOSIS — Z87891 Personal history of nicotine dependence: Secondary | ICD-10-CM

## 2020-08-07 DIAGNOSIS — J4540 Moderate persistent asthma, uncomplicated: Secondary | ICD-10-CM

## 2020-08-07 MED ORDER — LEVALBUTEROL TARTRATE 45 MCG/ACT IN AERO: 2.0000 | INHALATION_SPRAY | RESPIRATORY_TRACT | 11 refills | Status: DC | PRN

## 2020-08-07 MED ORDER — BUDESONIDE-FORMOTEROL FUMARATE 160-4.5 MCG/ACT IN AERO: 2.0000 | INHALATION_SPRAY | Freq: Two times a day (BID) | RESPIRATORY_TRACT | 12 refills | Status: DC

## 2020-08-07 NOTE — Patient Instructions (Addendum)
The patient should have follow up scheduled with myself in 3 months.    Take the xopanex/levalbuterol rescue inhaler every 4 to 6 hours as needed for wheezing or shortness of breath. You can also take it 15 minutes before exercise or exertional activity.  Start taking Symbicort 2 puffs in the morning, two puffs at night. Gargle after using this medication. This is to replace the trelegy inhaler.     To inhale the aerosol using an inhaler, follow these steps:  1. Remove the protective dust cap from the end of the mouthpiece. If the dust cap was not placed on the mouthpiece, check the mouthpiece for dirt or other objects. Be sure that the canister is fully and firmly inserted in the mouthpiece. 2. If you are using the inhaler for the first time or if you have not used the inhaler in more than 14 days, you will need to prime it. You may also need to prime the inhaler if it has been dropped. Ask your pharmacist or check the manufacturer's information if this happens. To prime the inhaler, shake it well and then press down on the canister 4 times to release 4 sprays into the air, away from your face. Be careful not to get albuterol in your eyes. 3. Shake the inhaler well. 4. Breathe out as completely as possible through your mouth. 4. Hold the canister with the mouthpiece on the bottom, facing you and the canister pointing upward. Place the open end of the mouthpiece into your mouth. Close your lips tightly around the mouthpiece. 6. Breathe in slowly and deeply through the mouthpiece.At the same time, press down once on the container to spray the medication into your mouth. 7. Try to hold your breath for 10 seconds. remove the inhaler, and breathe out slowly. 8. If you were told to use 2 puffs, wait 1 minute and then repeat steps 3-7. 9. Replace the protective cap on the inhaler. 10. Clean your inhaler regularly. Follow the manufacturer's directions carefully and ask your doctor or pharmacist if you  have any questions about cleaning your inhaler.  Check the back of the inhaler to keep track of the total number of doses left on the inhaler.

## 2020-08-07 NOTE — Progress Notes (Signed)
[name]    [id number]    1933-03-03  Primary Care Physician:Burchette, Alinda Sierras, MD Date of Appointment: 08/07/2020 Established Patient Visit  Chief complaint:   Chief Complaint  Patient presents with  . Follow-up    Trelegy making throat sore, stopped taking.  Breathing worse with exertion after stopping inhaler.    HPI: [name] is a 86 y.o. gentleman with history of CAD and remote CABG with subsequent PCI in 2016.  Also has Atrial fibrillation s/p repeated attempts at cardioversion. Is on eliquis and rate controlled. Established care with me in March 2021 for COPD.   Interval Updates: Saw Beth over the summer.  stopped taking trelegy due to hoarse voice, but it was helping his breathing.   I have reviewed the patient's family social and past medical history and updated as appropriate.   Past Medical History:  Diagnosis Date  . Arthritis   . CAD (coronary artery disease)    a. Cath September 2015 LIMA to the LAD patent, SVG to PDA patent, SVG to posterior lateral patent, SVG to OM with a 90% in-stent restenosis at an anastomotic lesion. This was treated with angioplasty. b. cath 04/01/2015 95% ISR in SVG to OM treated with 2.75x24 Synergy DES postdilated to 3.49mm, all other grafts patent  . Cataract    surgery,B/L  . CHF (congestive heart failure) ([place])   . Chronic kidney disease    nephrolithiasis  . Diabetes mellitus    TYPE 2  . GERD (gastroesophageal reflux disease)   . Heart murmur   . Hernia   . Hypercholesterolemia   . Hypertension   . Incontinence    hx  over 1 year,leaks without awareness  . Other and unspecified diseases of appendix   . PAF (paroxysmal atrial fibrillation) ([place])    in the setting of ischemia on 03/31/2015  . Pancreatitis   . Prostate CA ([place]) 03/01/04   prostate bx=Adenocarcinoam,gleason 3+4=7,PSA=6.75  . Shortness of breath dyspnea   . Ulcer    hx gastric    Past Surgical History:  Procedure Laterality Date  .  APPENDECTOMY    . BACK SURGERY    . CARDIAC CATHETERIZATION N/A 04/01/2015   Procedure: Left Heart Cath and Cors/Grafts Angiography;  Surgeon: Jettie Booze, MD;  Location: [place] [facility];  Service: Cardiovascular;  Laterality: N/A;  . CARDIAC CATHETERIZATION N/A 04/01/2015   Procedure: Coronary Stent Intervention;  Surgeon: Jettie Booze, MD;  Location: [place] [facility];  Service: Cardiovascular;  Laterality: N/A;  . CARDIOVERSION N/A 11/01/2019   Procedure: CARDIOVERSION;  Surgeon: Geralynn Rile, MD;  Location: [place];  Service: Cardiovascular;  Laterality: N/A;  . CARPAL TUNNEL RELEASE  2004   both hands  . CORONARY ARTERY BYPASS GRAFT    . CYSTOSCOPY  08/23/12   incomplete emoptying bladder  . HERNIA REPAIR    . incision and drainage of right chest abscess    . LAPAROSCOPIC CHOLECYSTECTOMY  09/2009  . LEFT HEART CATHETERIZATION WITH CORONARY ANGIOGRAM N/A 07/19/2014   Procedure: LEFT HEART CATHETERIZATION WITH CORONARY ANGIOGRAM;  Surgeon: Leonie Man, MD;  Location[facility] LAB;  Service: Cardiovascular;  Laterality: N/A;  . RECTAL SURGERY    . ROBOT ASSISTED LAPAROSCOPIC RADICAL PROSTATECTOMY  2005    Family History  Problem Relation Age of Onset  . Cancer Mother        stomach    Social History   Occupational History  Employer: RETIRED    Comment: Retired  Tobacco Use  . Smoking status: Former Smoker    Packs/day: 0.50    Years: 5.00    Pack years: 2.50    Types: Cigarettes    Quit date: 07/20/1972    Years since quitting: 48.0  . Smokeless tobacco: Never Used  . Tobacco comment: quit s  Vaping Use  . Vaping Use: Never used  Substance and Sexual Activity  . Alcohol use: No  . Drug use: No    Comment: quit smoking 40 years ago  . Sexual activity: Never    Physical Exam: Blood pressure 120/60, pulse 72, temperature (!) 97.4 F (36.3 C), temperature source Temporal, height 5\' 9"  (1.753 m), weight 183 lb (83 kg), SpO2 100  %.  Gen:      No acute distress Lungs:    No increased respiratory effort, symmetric chest wall excursion, clear to auscultation bilaterally, no wheezes or crackles CV:         Regular rate and rhythm; no murmurs, rubs, or gallops.  No pedal edema   Data Reviewed: Imaging: I have personally reviewed the   PFTs:  PFT Results Latest Ref Rng & Units 04/02/2020  FVC-Pre L 2.08  FVC-Predicted Pre % 57  FVC-Post L 2.26  FVC-Predicted Post % 62  Pre FEV1/FVC % % 62  Post FEV1/FCV % % 68  FEV1-Pre L 1.30  FEV1-Predicted Pre % 51  FEV1-Post L 1.53  DLCO uncorrected ml/min/mmHg 14.59  DLCO UNC% % 63  DLCO corrected ml/min/mmHg 14.59  DLCO COR %Predicted % 63  DLVA Predicted % 103  TLC L 5.48  TLC % Predicted % 79  RV % Predicted % 104   I have personally reviewed the patient's PFTs and consistent with moderately reduced airflow limitation with significant BD response.   Labs:  Immunization status: Immunization History  Administered Date(s) Administered  . Fluad Quad(high Dose 65+) 07/29/2020  . Influenza Split 10/04/2011  . Influenza Whole 07/16/2009  . Influenza, High Dose Seasonal PF 06/18/2016, 08/19/2017, 09/13/2018, 07/19/2019  . Influenza,inj,Quad PF,6+ Mos 08/13/2014  . Influenza-Unspecified 08/13/2017  . PFIZER SARS-COV-2 Vaccination 11/24/2019, 12/15/2019  . Pneumococcal Conjugate-13 05/31/2008, 06/03/2014  . Pneumococcal Polysaccharide-23 11/17/2006  . Td 11/23/2007, 10/18/2012  . Zoster 10/18/2013    Assessment:  Moderate persistent asthma Mild remote tobacco use Refractory A. Fib  Plan/Recommendations: Patient was not tolerant of Trelegy DPI due to thrush and sore throat. However this does seem to be helping his symptoms. Will trial ICS-LABA with Symbicort MDI. Instructed to gargle after use.  Continue levalbuterol as needed for dyspnea.  This seems to be helping. Some of his symptoms may be related to his A. Fib as well.   Return to Care: Return in about  3 months (around 11/07/2020).   Lenice Llamas, MD Pulmonary and [place]

## 2020-08-12 ENCOUNTER — Telehealth: Payer: Self-pay | Admitting: Internal Medicine

## 2020-08-12 MED ORDER — ALBUTEROL SULFATE HFA 108 (90 BASE) MCG/ACT IN AERS: 2.0000 | INHALATION_SPRAY | Freq: Four times a day (QID) | RESPIRATORY_TRACT | 5 refills | Status: DC | PRN

## 2020-08-12 NOTE — Telephone Encounter (Signed)
[name] returning a phone call. [name] can be reached at [phone].

## 2020-08-12 NOTE — Telephone Encounter (Signed)
LMTCB

## 2020-08-12 NOTE — Telephone Encounter (Signed)
Albuterol is fine. Just worried about his a. Fib.

## 2020-08-12 NOTE — Telephone Encounter (Signed)
Called and spoke with wife [name] per DPR to let her know of Dr. Mauricio Po recs. Wife stated that she is not sure she is willing to take a chance with the Albuterol and the patient's afib. She states that she is going to call their insurance and see what medications would be covered for the patient or cheaper and let us know once she has an answer. Expressed understanding. Nothing further needed at this time.

## 2020-08-12 NOTE — Telephone Encounter (Signed)
Spoke with the pt's spouse  She states xopenex inhaler is not covered much by [employer] and they need something cheaper  He has taken albuterol in the past and spouse states never had any problems with it  Please advise, thank you!

## 2020-08-27 ENCOUNTER — Other Ambulatory Visit: Payer: Self-pay | Admitting: Cardiology

## 2020-08-27 ENCOUNTER — Other Ambulatory Visit: Payer: Self-pay | Admitting: Family Medicine

## 2020-09-06 ENCOUNTER — Other Ambulatory Visit: Payer: Self-pay | Admitting: Family Medicine

## 2020-09-07 ENCOUNTER — Observation Stay (HOSPITAL_COMMUNITY)
Admission: EM | Admit: 2020-09-07 | Discharge: 2020-09-08 | Disposition: A | Discharge status: Home / Self Care | Payer: Medicare PPO | Attending: Internal Medicine | Admitting: Internal Medicine

## 2020-09-07 ENCOUNTER — Other Ambulatory Visit: Payer: Self-pay

## 2020-09-07 ENCOUNTER — Emergency Department (HOSPITAL_COMMUNITY): Payer: Medicare PPO

## 2020-09-07 ENCOUNTER — Encounter (HOSPITAL_COMMUNITY): Payer: Self-pay | Admitting: Emergency Medicine

## 2020-09-07 DIAGNOSIS — E1121 Type 2 diabetes mellitus with diabetic nephropathy: Secondary | ICD-10-CM | POA: Diagnosis not present

## 2020-09-07 DIAGNOSIS — R079 Chest pain, unspecified: Secondary | ICD-10-CM | POA: Diagnosis present

## 2020-09-07 DIAGNOSIS — I509 Heart failure, unspecified: Secondary | ICD-10-CM

## 2020-09-07 DIAGNOSIS — N184 Chronic kidney disease, stage 4 (severe): Secondary | ICD-10-CM | POA: Insufficient documentation

## 2020-09-07 DIAGNOSIS — R778 Other specified abnormalities of plasma proteins: Secondary | ICD-10-CM | POA: Diagnosis not present

## 2020-09-07 DIAGNOSIS — Z9861 Coronary angioplasty status: Secondary | ICD-10-CM | POA: Diagnosis not present

## 2020-09-07 DIAGNOSIS — R0609 Other forms of dyspnea: Secondary | ICD-10-CM | POA: Diagnosis present

## 2020-09-07 DIAGNOSIS — J209 Acute bronchitis, unspecified: Secondary | ICD-10-CM

## 2020-09-07 DIAGNOSIS — J449 Chronic obstructive pulmonary disease, unspecified: Secondary | ICD-10-CM | POA: Diagnosis not present

## 2020-09-07 DIAGNOSIS — I480 Paroxysmal atrial fibrillation: Secondary | ICD-10-CM | POA: Diagnosis present

## 2020-09-07 DIAGNOSIS — I5022 Chronic systolic (congestive) heart failure: Secondary | ICD-10-CM | POA: Diagnosis not present

## 2020-09-07 DIAGNOSIS — E785 Hyperlipidemia, unspecified: Secondary | ICD-10-CM | POA: Diagnosis present

## 2020-09-07 DIAGNOSIS — J440 Chronic obstructive pulmonary disease with acute lower respiratory infection: Secondary | ICD-10-CM

## 2020-09-07 DIAGNOSIS — Z20822 Contact with and (suspected) exposure to covid-19: Secondary | ICD-10-CM | POA: Diagnosis not present

## 2020-09-07 DIAGNOSIS — R0789 Other chest pain: Secondary | ICD-10-CM | POA: Diagnosis present

## 2020-09-07 DIAGNOSIS — I130 Hypertensive heart and chronic kidney disease with heart failure and stage 1 through stage 4 chronic kidney disease, or unspecified chronic kidney disease: Secondary | ICD-10-CM | POA: Insufficient documentation

## 2020-09-07 DIAGNOSIS — Z8546 Personal history of malignant neoplasm of prostate: Secondary | ICD-10-CM | POA: Insufficient documentation

## 2020-09-07 DIAGNOSIS — Z7984 Long term (current) use of oral hypoglycemic drugs: Secondary | ICD-10-CM | POA: Diagnosis not present

## 2020-09-07 DIAGNOSIS — R0600 Dyspnea, unspecified: Secondary | ICD-10-CM | POA: Diagnosis present

## 2020-09-07 DIAGNOSIS — I5033 Acute on chronic diastolic (congestive) heart failure: Secondary | ICD-10-CM

## 2020-09-07 DIAGNOSIS — N183 Chronic kidney disease, stage 3 unspecified: Secondary | ICD-10-CM | POA: Diagnosis present

## 2020-09-07 DIAGNOSIS — R0602 Shortness of breath: Secondary | ICD-10-CM | POA: Diagnosis not present

## 2020-09-07 DIAGNOSIS — Z7901 Long term (current) use of anticoagulants: Secondary | ICD-10-CM | POA: Insufficient documentation

## 2020-09-07 DIAGNOSIS — K2211 Ulcer of esophagus with bleeding: Secondary | ICD-10-CM | POA: Diagnosis present

## 2020-09-07 DIAGNOSIS — E1122 Type 2 diabetes mellitus with diabetic chronic kidney disease: Secondary | ICD-10-CM | POA: Diagnosis not present

## 2020-09-07 DIAGNOSIS — R7989 Other specified abnormal findings of blood chemistry: Secondary | ICD-10-CM | POA: Insufficient documentation

## 2020-09-07 DIAGNOSIS — I517 Cardiomegaly: Secondary | ICD-10-CM | POA: Diagnosis not present

## 2020-09-07 DIAGNOSIS — Z79899 Other long term (current) drug therapy: Secondary | ICD-10-CM | POA: Diagnosis not present

## 2020-09-07 DIAGNOSIS — I10 Essential (primary) hypertension: Secondary | ICD-10-CM | POA: Diagnosis present

## 2020-09-07 DIAGNOSIS — I25118 Atherosclerotic heart disease of native coronary artery with other forms of angina pectoris: Secondary | ICD-10-CM | POA: Diagnosis present

## 2020-09-07 DIAGNOSIS — Z87891 Personal history of nicotine dependence: Secondary | ICD-10-CM | POA: Insufficient documentation

## 2020-09-07 DIAGNOSIS — Z951 Presence of aortocoronary bypass graft: Secondary | ICD-10-CM | POA: Diagnosis not present

## 2020-09-07 DIAGNOSIS — I25119 Atherosclerotic heart disease of native coronary artery with unspecified angina pectoris: Secondary | ICD-10-CM | POA: Diagnosis not present

## 2020-09-07 DIAGNOSIS — Z7902 Long term (current) use of antithrombotics/antiplatelets: Secondary | ICD-10-CM | POA: Insufficient documentation

## 2020-09-07 DIAGNOSIS — I2510 Atherosclerotic heart disease of native coronary artery without angina pectoris: Secondary | ICD-10-CM | POA: Diagnosis not present

## 2020-09-07 LAB — BASIC METABOLIC PANEL WITH GFR
Anion gap: 12 (ref 5–15)
BUN: 29 mg/dL — ABNORMAL HIGH (ref 8–23)
CO2: 21 mmol/L — ABNORMAL LOW (ref 22–32)
Calcium: 9 mg/dL (ref 8.9–10.3)
Chloride: 95 mmol/L — ABNORMAL LOW (ref 98–111)
Creatinine, Ser: 2.01 mg/dL — ABNORMAL HIGH (ref 0.61–1.24)
GFR, Estimated: 32 mL/min — ABNORMAL LOW
Glucose, Bld: 265 mg/dL — ABNORMAL HIGH (ref 70–99)
Potassium: 3.8 mmol/L (ref 3.5–5.1)
Sodium: 128 mmol/L — ABNORMAL LOW (ref 135–145)

## 2020-09-07 LAB — TROPONIN I (HIGH SENSITIVITY)
Troponin I (High Sensitivity): 79 ng/L — ABNORMAL HIGH (ref <18)
Troponin I (High Sensitivity): 89 ng/L — ABNORMAL HIGH
Troponin I (High Sensitivity): 99 ng/L — ABNORMAL HIGH (ref <18)

## 2020-09-07 LAB — CBC
HCT: 39.2 % (ref 39.0–52.0)
Hemoglobin: 13.5 g/dL (ref 13.0–17.0)
MCH: 31.1 pg (ref 26.0–34.0)
MCHC: 34.4 g/dL (ref 30.0–36.0)
MCV: 90.3 fL (ref 80.0–100.0)
Platelets: 120 K/uL — ABNORMAL LOW (ref 150–400)
RBC: 4.34 MIL/uL (ref 4.22–5.81)
RDW: 12.4 % (ref 11.5–15.5)
WBC: 11.5 K/uL — ABNORMAL HIGH (ref 4.0–10.5)
nRBC: 0 % (ref 0.0–0.2)

## 2020-09-07 LAB — HEPATIC FUNCTION PANEL
ALT: 22 U/L (ref 0–44)
AST: 16 U/L (ref 15–41)
Albumin: 3.3 g/dL — ABNORMAL LOW (ref 3.5–5.0)
Alkaline Phosphatase: 75 U/L (ref 38–126)
Bilirubin, Direct: 0.3 mg/dL — ABNORMAL HIGH (ref 0.0–0.2)
Indirect Bilirubin: 1.8 mg/dL — ABNORMAL HIGH (ref 0.3–0.9)
Total Bilirubin: 2.1 mg/dL — ABNORMAL HIGH (ref 0.3–1.2)
Total Protein: 7 g/dL (ref 6.5–8.1)

## 2020-09-07 LAB — HEMOGLOBIN A1C
Hgb A1c MFr Bld: 7.3 % — ABNORMAL HIGH (ref 4.8–5.6)
Mean Plasma Glucose: 162.81 mg/dL

## 2020-09-07 LAB — RESPIRATORY PANEL BY RT PCR (FLU A&B, COVID)
Influenza A by PCR: NEGATIVE
Influenza B by PCR: NEGATIVE
SARS Coronavirus 2 by RT PCR: NEGATIVE

## 2020-09-07 LAB — GLUCOSE, CAPILLARY: Glucose-Capillary: 231 mg/dL — ABNORMAL HIGH (ref 70–99)

## 2020-09-07 LAB — BRAIN NATRIURETIC PEPTIDE: B Natriuretic Peptide: 426.6 pg/mL — ABNORMAL HIGH (ref 0.0–100.0)

## 2020-09-07 MED ORDER — CARVEDILOL 6.25 MG PO TABS
6.2500 mg | ORAL_TABLET | Freq: Two times a day (BID) | ORAL | Status: DC
  Administered 2020-09-07 – 2020-09-08 (×2): 6.25 mg via ORAL
  Filled 2020-09-07 (×2): qty 1

## 2020-09-07 MED ORDER — APIXABAN 2.5 MG PO TABS
2.5000 mg | ORAL_TABLET | Freq: Two times a day (BID) | ORAL | Status: DC
  Administered 2020-09-07 – 2020-09-08 (×2): 2.5 mg via ORAL
  Filled 2020-09-07 (×3): qty 1

## 2020-09-07 MED ORDER — ALBUTEROL SULFATE (2.5 MG/3ML) 0.083% IN NEBU: 2.5000 mg | INHALATION_SOLUTION | RESPIRATORY_TRACT | Status: DC | PRN

## 2020-09-07 MED ORDER — POLYVINYL ALCOHOL 1.4 % OP SOLN
1.0000 [drp] | Freq: Three times a day (TID) | OPHTHALMIC | Status: DC | PRN
  Filled 2020-09-07: qty 15

## 2020-09-07 MED ORDER — ACETAMINOPHEN 650 MG RE SUPP: 650.0000 mg | Freq: Four times a day (QID) | RECTAL | Status: DC | PRN

## 2020-09-07 MED ORDER — AMLODIPINE BESYLATE 5 MG PO TABS
5.0000 mg | ORAL_TABLET | Freq: Every day | ORAL | Status: DC
  Administered 2020-09-08: 5 mg via ORAL
  Filled 2020-09-07: qty 1

## 2020-09-07 MED ORDER — FUROSEMIDE 10 MG/ML IJ SOLN
40.0000 mg | Freq: Two times a day (BID) | INTRAMUSCULAR | Status: DC
  Administered 2020-09-08: 40 mg via INTRAVENOUS
  Filled 2020-09-07: qty 4

## 2020-09-07 MED ORDER — FUROSEMIDE 10 MG/ML IJ SOLN
40.0000 mg | Freq: Once | INTRAMUSCULAR | Status: AC
  Administered 2020-09-07: 40 mg via INTRAVENOUS
  Filled 2020-09-07: qty 4

## 2020-09-07 MED ORDER — MOMETASONE FURO-FORMOTEROL FUM 200-5 MCG/ACT IN AERO
2.0000 | INHALATION_SPRAY | Freq: Two times a day (BID) | RESPIRATORY_TRACT | Status: DC
  Filled 2020-09-07: qty 8.8

## 2020-09-07 MED ORDER — NITROGLYCERIN 0.4 MG SL SUBL: 0.4000 mg | SUBLINGUAL_TABLET | SUBLINGUAL | Status: DC | PRN

## 2020-09-07 MED ORDER — PANTOPRAZOLE SODIUM 40 MG PO TBEC
40.0000 mg | DELAYED_RELEASE_TABLET | Freq: Every day | ORAL | Status: DC
  Administered 2020-09-08: 40 mg via ORAL
  Filled 2020-09-07: qty 1

## 2020-09-07 MED ORDER — ASPIRIN 81 MG PO CHEW
324.0000 mg | CHEWABLE_TABLET | Freq: Once | ORAL | Status: AC
  Administered 2020-09-07: 324 mg via ORAL
  Filled 2020-09-07: qty 4

## 2020-09-07 MED ORDER — IPRATROPIUM-ALBUTEROL 0.5-2.5 (3) MG/3ML IN SOLN: 3.0000 mL | Freq: Once | RESPIRATORY_TRACT | Status: DC

## 2020-09-07 MED ORDER — SODIUM CHLORIDE 0.9% FLUSH
3.0000 mL | Freq: Two times a day (BID) | INTRAVENOUS | Status: DC
  Administered 2020-09-07 – 2020-09-08 (×2): 3 mL via INTRAVENOUS

## 2020-09-07 MED ORDER — FUROSEMIDE 10 MG/ML IJ SOLN: 20.0000 mg | Freq: Once | INTRAMUSCULAR | Status: DC

## 2020-09-07 MED ORDER — ONDANSETRON HCL 4 MG/2ML IJ SOLN
4.0000 mg | Freq: Four times a day (QID) | INTRAMUSCULAR | Status: DC | PRN
  Administered 2020-09-07: 4 mg via INTRAVENOUS
  Filled 2020-09-07: qty 2

## 2020-09-07 MED ORDER — METHYLPREDNISOLONE SODIUM SUCC 125 MG IJ SOLR
125.0000 mg | Freq: Once | INTRAMUSCULAR | Status: AC
  Administered 2020-09-07: 125 mg via INTRAVENOUS
  Filled 2020-09-07: qty 2

## 2020-09-07 MED ORDER — ROSUVASTATIN CALCIUM 20 MG PO TABS
20.0000 mg | ORAL_TABLET | Freq: Every day | ORAL | Status: DC
  Administered 2020-09-08: 20 mg via ORAL
  Filled 2020-09-07: qty 1

## 2020-09-07 MED ORDER — CARVEDILOL 12.5 MG PO TABS: 6.2500 mg | ORAL_TABLET | Freq: Two times a day (BID) | ORAL | Status: DC

## 2020-09-07 MED ORDER — CLOPIDOGREL BISULFATE 75 MG PO TABS
75.0000 mg | ORAL_TABLET | Freq: Every day | ORAL | Status: DC
  Administered 2020-09-08: 75 mg via ORAL
  Filled 2020-09-07: qty 1

## 2020-09-07 MED ORDER — ACETAMINOPHEN 325 MG PO TABS
650.0000 mg | ORAL_TABLET | Freq: Four times a day (QID) | ORAL | Status: DC | PRN
  Administered 2020-09-07: 650 mg via ORAL
  Filled 2020-09-07: qty 2

## 2020-09-07 MED ORDER — MELATONIN 3 MG PO TABS
3.0000 mg | ORAL_TABLET | Freq: Every evening | ORAL | Status: DC | PRN
  Administered 2020-09-07: 3 mg via ORAL
  Filled 2020-09-07: qty 1

## 2020-09-07 MED ORDER — DOCUSATE SODIUM 100 MG PO CAPS: 100.0000 mg | ORAL_CAPSULE | Freq: Two times a day (BID) | ORAL | Status: DC | PRN

## 2020-09-07 MED ORDER — INSULIN ASPART 100 UNIT/ML ~~LOC~~ SOLN
0.0000 [IU] | Freq: Three times a day (TID) | SUBCUTANEOUS | Status: DC
  Administered 2020-09-08: 11 [IU] via SUBCUTANEOUS
  Administered 2020-09-08: 15 [IU] via SUBCUTANEOUS

## 2020-09-07 MED ORDER — SPIRONOLACTONE 25 MG PO TABS
25.0000 mg | ORAL_TABLET | Freq: Every day | ORAL | Status: DC
  Administered 2020-09-08: 25 mg via ORAL
  Filled 2020-09-07: qty 1

## 2020-09-07 NOTE — Plan of Care (Signed)
  Problem: Education: Goal: Knowledge of General Education information will improve Description: Including pain rating scale, medication(s)/side effects and non-pharmacologic comfort measures Outcome: Progressing   Problem: Health Behavior/Discharge Planning: Goal: Ability to manage health-related needs will improve Outcome: Progressing   Problem: Clinical Measurements: Goal: Ability to maintain clinical measurements within normal limits will improve Outcome: Progressing Goal: Will remain free from infection Outcome: Progressing Goal: Respiratory complications will improve Outcome: Progressing Goal: Cardiovascular complication will be avoided Outcome: Progressing   Problem: Activity: Goal: Risk for activity intolerance will decrease Outcome: Progressing   Problem: Coping: Goal: Level of anxiety will decrease Outcome: Progressing   Problem: Elimination: Goal: Will not experience complications related to bowel motility Outcome: Progressing Goal: Will not experience complications related to urinary retention Outcome: Progressing   Problem: Pain Managment: Goal: General experience of comfort will improve Outcome: Progressing   Problem: Safety: Goal: Ability to remain free from injury will improve Outcome: Progressing   Problem: Skin Integrity: Goal: Risk for impaired skin integrity will decrease Outcome: Progressing   Problem: Education: Goal: Individualized Educational Video(s) Outcome: Progressing   Problem: Cardiac: Goal: Ability to achieve and maintain adequate cardiopulmonary perfusion will improve Outcome: Progressing

## 2020-09-07 NOTE — ED Provider Notes (Signed)
[facility] EMERGENCY DEPARTMENT Provider Note   CSN: [id number] Arrival date & time: 09/07/20  1406     History Chief Complaint  Patient presents with  . Shortness of Breath  . Fever    [name] is a 86 y.o. male.  HPI      CAD, afib, PCI  2 days of shortness of breath, intermittent chest pain relieved by nitro Tight pain central chest, radiates to his back, an intense tightness Comes and goes, relieved by nitro, worsened by exertion Dyspnea and exercise intolerance at baseline, has been worse over last few days, has been short of breath with minimal exertion over the last few days  Fever 100.9 at home relieved by tylenol  No nausea, no vomiting, no mental status changes No leg pain or swelling No dizziness  No cough  Negative COVID test yesterday, no known sick contacts Had pfizer vaccine    Past Medical History:  Diagnosis Date  . Arthritis   . CAD (coronary artery disease)    a. Cath September 2015 LIMA to the LAD patent, SVG to PDA patent, SVG to posterior lateral patent, SVG to OM with a 90% in-stent restenosis at an anastomotic lesion. This was treated with angioplasty. b. cath 04/01/2015 95% ISR in SVG to OM treated with 2.75x24 Synergy DES postdilated to 3.4mm, all other grafts patent  . Cataract    surgery,B/L  . CHF (congestive heart failure) ([place])   . Chronic kidney disease    nephrolithiasis  . Diabetes mellitus    TYPE 2  . GERD (gastroesophageal reflux disease)   . Heart murmur   . Hernia   . Hypercholesterolemia   . Hypertension   . Incontinence    hx  over 1 year,leaks without awareness  . Other and unspecified diseases of appendix   . PAF (paroxysmal atrial fibrillation) ([place])    in the setting of ischemia on 03/31/2015  . Pancreatitis   . Prostate CA ([place]) 03/01/04   prostate bx=Adenocarcinoam,gleason 3+4=7,PSA=6.75  . Shortness of breath dyspnea   . Ulcer    hx gastric    Patient Active Problem List    Diagnosis Date Noted  . Acute exacerbation of CHF (congestive heart failure) ([place]) 09/07/2020  . COPD mixed type ([place]) 04/03/2020  . Chronic systolic HF (heart failure) ([place]) 12/26/2019  . Systolic dysfunction with acute on chronic heart failure ([place]) 10/23/2019  . Educated about COVID-19 virus infection 10/23/2019  . Persistent atrial fibrillation ([place]) 08/29/2019  . Nonrheumatic aortic valve stenosis 08/29/2019  . Elevated troponin 07/27/2019  . HLD (hyperlipidemia) 07/27/2019  . GERD (gastroesophageal reflux disease) 07/27/2019  . Congestive heart failure (CHF) ([place]) 07/26/2019  . Pure hypercholesterolemia   . Acute on chronic diastolic CHF (congestive heart failure) ([place])   . SOB (shortness of breath) 06/04/2019  . CKD (chronic kidney disease), stage III ([place]) 06/04/2019  . Coronary artery disease of native artery of native heart with stable angina pectoris ([place]) 03/24/2018  . Bilateral carotid artery stenosis 07/23/2017  . Palpitation 07/23/2017  . Urinary urgency 07/28/2016  . PAF (paroxysmal atrial fibrillation) ([place])   . DOE (dyspnea on exertion) 07/17/2014  . Acute on chronic renal insufficiency- ACE and diuretics held 07/17/2014  . Diastolic dysfunction- moderate on echo 2013 07/17/2014  . Aortic stenosis, mild by echo 2013 07/17/2014  . LBBB (left bundle branch block) 07/17/2014  . Chest pain 07/17/2014  . Unstable angina ([place]) 07/16/2014  . CKD (chronic kidney disease) stage 4, GFR  15-29 ml/min ([place]) 12/03/2013  . Squamous cell cancer of skin of hand 05/31/2013  . HEAD TRAUMA, CLOSED 09/24/2010  . Ischemic cardiomyopathy- last EF Normal Myoview Jan 2015 04/02/2010  . INSOMNIA 04/02/2010  . Moderate bilateral ICA disease by doppler June 2015 01/08/2010  . DYSPHAGIA UNSPECIFIED 09/05/2009  . Hx GERD/PUD 09/03/2009  . CHOLELITHIASIS, WITH CHOLECYSTITIS 09/03/2009  . ARTHRITIS 07/16/2009  . HYPOKALEMIA 11/06/2008  . CAD in native artery 11/06/2008  . Type 2 diabetes  mellitus with established diabetic nephropathy ([place]) 11/23/2007  . Dyslipidemia 11/23/2007  . ROSACEA 11/23/2007  . DYSPNEA ON EXERTION 11/23/2007  . UNS ADVRS EFF OTH RX MEDICINAL&BIOLOGICAL SBSTNC 11/23/2007  . HTN (hypertension) 11/22/2007  . Hx of Prostate ca 03/01/2004    Past Surgical History:  Procedure Laterality Date  . APPENDECTOMY    . BACK SURGERY    . CARDIAC CATHETERIZATION N/A 04/01/2015   Procedure: Left Heart Cath and Cors/Grafts Angiography;  Surgeon: Jettie Booze, MD;  Location: [facility] LAB;  Service: Cardiovascular;  Laterality: N/A;  . CARDIAC CATHETERIZATION N/A 04/01/2015   Procedure: Coronary Stent Intervention;  Surgeon: Jettie Booze, MD;  Location: [facility];  Service: Cardiovascular;  Laterality: N/A;  . CARDIOVERSION N/A 11/01/2019   Procedure: CARDIOVERSION;  Surgeon: Geralynn Rile, MD;  Location: [facility];  Service: Cardiovascular;  Laterality: N/A;  . CARPAL TUNNEL RELEASE  2004   both hands  . CORONARY ARTERY BYPASS GRAFT    . CYSTOSCOPY  08/23/12   incomplete emoptying bladder  . HERNIA REPAIR    . incision and drainage of right chest abscess    . LAPAROSCOPIC CHOLECYSTECTOMY  09/2009  . LEFT HEART CATHETERIZATION WITH CORONARY ANGIOGRAM N/A 07/19/2014   Procedure: LEFT HEART CATHETERIZATION WITH CORONARY ANGIOGRAM;  Surgeon: Leonie Man, MD;  [facility] C[facility] LAB;  Service: Cardiovascular;  Laterality: N/A;  . RECTAL SURGERY    . ROBOT ASSISTED LAPAROSCOPIC RADICAL PROSTATECTOMY  2005       Family History  Problem Relation Age of Onset  . Cancer Mother        stomach    Social History   Tobacco Use  . Smoking status: Former Smoker    Packs/day: 0.50    Years: 5.00    Pack years: 2.50    Types: Cigarettes    Quit date: 07/20/1972    Years since quitting: 48.1  . Smokeless tobacco: Never Used  . Tobacco comment: quit s  Vaping Use  . Vaping Use: Never used  Substance Use Topics  . Alcohol  use: No  . Drug use: No    Comment: quit smoking 40 years ago    Home Medications Prior to Admission medications   Medication Sig Start Date End Date Taking? Authorizing Provider  acetaminophen (TYLENOL) 500 MG tablet Take 1,000 mg by mouth every 4 (four) hours as needed for fever or headache (pain).   Yes [provider]  albuterol (VENTOLIN HFA) 108 (90 Base) MCG/ACT inhaler Inhale 2 puffs into the lungs every 6 (six) hours as needed. Patient taking differently: Inhale 2 puffs into the lungs every 6 (six) hours as needed for wheezing or shortness of breath.  08/12/20  Yes Spero Geralds, MD  amLODipine (NORVASC) 10 MG tablet Take 5 mg by mouth daily.   Yes [provider]  budesonide-formoterol (SYMBICORT) 160-4.5 MCG/ACT inhaler Inhale 2 puffs into the lungs in the morning and at bedtime. 08/07/20  Yes Spero Geralds, MD  carvedilol (COREG) 6.25 MG tablet TAKE 1 TABLET (6.25 MG TOTAL) BY MOUTH 2 (TWO) TIMES DAILY WITH A MEAL. 08/27/20  Yes [name], MD  clopidogrel (PLAVIX) 75 MG tablet TAKE 1 TABLET (75 MG TOTAL) BY MOUTH DAILY. Patient taking differently: Take 75 mg by mouth daily.  03/14/20  Yes Burchette, Alinda Sierras, MD  docusate sodium (STOOL SOFTENER) 100 MG capsule Take 1 capsule (100 mg total) by mouth 2 (two) times daily as needed for mild constipation. Patient taking differently: Take 100 mg by mouth at bedtime.  07/28/19  Yes Ivor Costa, MD  ELIQUIS 2.5 MG TABS tablet TAKE 1 TABLET BY MOUTH TWICE A DAY Patient taking differently: Take 2.5 mg by mouth 2 (two) times daily.  08/27/20  Yes Burchette, Alinda Sierras, MD  furosemide (LASIX) 40 MG tablet Take 1 tablet (40 mg total) by mouth 2 (two) times daily. 07/30/20  Yes Burchette, Alinda Sierras, MD  hydrocortisone 2.5 % cream Apply 1 application topically daily as needed (facial breakouts).   Yes [provider]  Multiple Vitamins-Minerals (ICAPS AREDS 2 PO) Take 1 capsule by mouth daily.   Yes [provider]  nitroGLYCERIN (NITROSTAT) 0.4 MG SL tablet Place 1 tablet (0.4 mg total) under the tongue every 5 (five) minutes x 3 doses as needed for chest pain (if no relief after 3rd dose, proceed to the ED for an evaluation). 04/17/18  Yes [name], MD  pantoprazole (PROTONIX) 40 MG tablet TAKE 1 TABLET BY MOUTH EVERY DAY Patient taking differently: Take 40 mg by mouth daily.  07/22/20  Yes Burchette, Alinda Sierras, MD  Polyethyl Glycol-Propyl Glycol (LUBRICANT EYE DROPS) 0.4-0.3 % SOLN Place 1-2 drops into both eyes 3 (three) times daily as needed (dry/irritated eyes.).   Yes [provider]  rosuvastatin (CRESTOR) 20 MG tablet TAKE 1 TABLET BY MOUTH EVERY DAY Patient taking differently: Take 20 mg by mouth daily.  08/27/20  Yes Burchette, Alinda Sierras, MD  sitaGLIPtin (JANUVIA) 50 MG tablet Take 1 tablet (50 mg total) by mouth daily. 03/10/20  Yes Burchette, Alinda Sierras, MD  spironolactone (ALDACTONE) 25 MG tablet TAKE 1 TABLET BY MOUTH EVERY DAY Patient taking differently: Take 25 mg by mouth daily.  05/12/20  Yes [name], MD  albuterol (PROVENTIL) (2.5 MG/3ML) 0.083% nebulizer solution Take 3 mLs (2.5 mg total) by nebulization every 4 (four) hours as needed for wheezing or shortness of breath. Patient not taking: Reported on 09/07/2020 07/28/19   Ivor Costa, MD  amLODipine (NORVASC) 5 MG tablet Take 1 tablet (5 mg total) by mouth daily. Patient not taking: Reported on 09/07/2020 07/30/20   [name], MD  Continuous Blood Gluc Receiver (FREESTYLE LIBRE 14 DAY READER) DEVI 1 Device by Does not apply route every 14 (fourteen) days. 04/28/20   Burchette, Alinda Sierras, MD  Continuous Blood Gluc Sensor (FREESTYLE LIBRE 14 DAY SENSOR) MISC 1 Device by Does not apply route every 14 (fourteen) days. 04/28/20   Burchette, Alinda Sierras, MD  glucose blood (FREESTYLE LITE) test strip 1 each by Other route as needed. Use as instructed     [provider]  Lancets (FREESTYLE) lancets 1 each by  Other route as needed. Use as instructed     [provider]  levalbuterol (XOPENEX) 0.63 MG/3ML nebulizer solution Take 3 mLs (0.63 mg total) by nebulization every 6 (six) hours as needed for shortness of breath. Patient not taking: Reported on 09/07/2020 01/08/20   Spero Geralds, MD  Allergies    Codeine and Morphine  Review of Systems   Review of Systems  Constitutional: Positive for fever.  HENT: Negative for sore throat.   Eyes: Negative for visual disturbance.  Respiratory: Positive for chest tightness and shortness of breath.   Cardiovascular: Positive for chest pain.  Gastrointestinal: Negative for abdominal pain, nausea and vomiting.  Genitourinary: Negative for difficulty urinating and dysuria.  Musculoskeletal: Positive for back pain. Negative for neck stiffness.  Skin: Negative for rash.  Neurological: Negative for syncope and headaches.    Physical Exam Updated Vital Signs BP (!) 115/54 (BP Location: Left Arm)   Pulse (!) 53   Temp (!) 97.5 F (36.4 C) (Oral)   Resp 16   Ht 5\' 9"  (1.753 m)   Wt 79.6 kg   SpO2 94%   BMI 25.91 kg/m   Physical Exam Vitals and nursing note reviewed.  Constitutional:      General: He is not in acute distress.    Appearance: He is well-developed. He is not diaphoretic.  HENT:     Head: Normocephalic and atraumatic.  Eyes:     Conjunctiva/sclera: Conjunctivae normal.  Cardiovascular:     Rate and Rhythm: Normal rate and regular rhythm.     Heart sounds: Normal heart sounds. No murmur heard.  No friction rub. No gallop.   Pulmonary:     Effort: Pulmonary effort is normal. No respiratory distress.     Breath sounds: Wheezing present. No rales.  Abdominal:     General: There is no distension.     Palpations: Abdomen is soft.     Tenderness: There is no abdominal tenderness. There is no guarding.  Musculoskeletal:     Cervical back: Normal range of motion.  Skin:    General: Skin is warm and dry.  Neurological:      Mental Status: He is alert and oriented to person, place, and time.     ED Results / Procedures / Treatments   Labs (all labs ordered are listed, but only abnormal results are displayed) Labs Reviewed  BASIC METABOLIC PANEL - Abnormal; Notable for the following components:      Result Value   Sodium 128 (*)    Chloride 95 (*)    CO2 21 (*)    Glucose, Bld 265 (*)    BUN 29 (*)    Creatinine, Ser 2.01 (*)    GFR, Estimated 32 (*)    All other components within normal limits  CBC - Abnormal; Notable for the following components:   WBC 11.5 (*)    Platelets 120 (*)    All other components within normal limits  BRAIN NATRIURETIC PEPTIDE - Abnormal; Notable for the following components:   B Natriuretic Peptide 426.6 (*)    All other components within normal limits  HEPATIC FUNCTION PANEL - Abnormal; Notable for the following components:   Albumin 3.3 (*)    Total Bilirubin 2.1 (*)    Bilirubin, Direct 0.3 (*)    Indirect Bilirubin 1.8 (*)    All other components within normal limits  COMPREHENSIVE METABOLIC PANEL - Abnormal; Notable for the following components:   Sodium 132 (*)    CO2 21 (*)    Glucose, Bld 261 (*)    BUN 32 (*)    Creatinine, Ser 2.20 (*)    Albumin 3.1 (*)    AST 14 (*)    Total Bilirubin 1.8 (*)    GFR, Estimated 29 (*)    All  other components within normal limits  CBC - Abnormal; Notable for the following components:   WBC 11.1 (*)    Hemoglobin 12.9 (*)    HCT 37.8 (*)    Platelets 114 (*)    All other components within normal limits  HEMOGLOBIN A1C - Abnormal; Notable for the following components:   Hgb A1c MFr Bld 7.3 (*)    All other components within normal limits  GLUCOSE, CAPILLARY - Abnormal; Notable for the following components:   Glucose-Capillary 231 (*)    All other components within normal limits  GLUCOSE, CAPILLARY - Abnormal; Notable for the following components:   Glucose-Capillary 314 (*)    All other components within  normal limits  GLUCOSE, CAPILLARY - Abnormal; Notable for the following components:   Glucose-Capillary 371 (*)    All other components within normal limits  TROPONIN I (HIGH SENSITIVITY) - Abnormal; Notable for the following components:   Troponin I (High Sensitivity) 79 (*)    All other components within normal limits  TROPONIN I (HIGH SENSITIVITY) - Abnormal; Notable for the following components:   Troponin I (High Sensitivity) 89 (*)    All other components within normal limits  TROPONIN I (HIGH SENSITIVITY) - Abnormal; Notable for the following components:   Troponin I (High Sensitivity) 99 (*)    All other components within normal limits  RESPIRATORY PANEL BY RT PCR (FLU A&B, COVID)  CULTURE, BLOOD (ROUTINE X 2)  CULTURE, BLOOD (ROUTINE X 2)  URINE CULTURE  RESPIRATORY PANEL BY PCR  MAGNESIUM  URINALYSIS, ROUTINE W REFLEX MICROSCOPIC    EKG EKG Interpretation  Date/Time:  Sunday September 07 2020 14:09:22 EST Ventricular Rate:  91 PR Interval:    QRS Duration: 146 QT Interval:  378 QTC Calculation: 464 R Axis:   41 Text Interpretation: Atrial fibrillation Left bundle branch block Abnormal ECG No significant change since last tracing Confirmed by Loa Idler ([id number]) on 09/07/2020 3:54:23 PM   Radiology DG Chest Portable 1 View  Result Date: 09/07/2020 CLINICAL DATA:  Shortness of breath EXAM: PORTABLE CHEST 1 VIEW COMPARISON:  April 02, 2020 FINDINGS: No pneumothorax. Cardiomegaly. The hila and mediastinum are unremarkable. No pulmonary nodules, masses, focal infiltrates, or overt edema. IMPRESSION: No active disease. Electronically Signed   By: David  Williams III M.D   On: 09/07/2020 16:44   ECHOCARDIOGRAM COMPLETE  Result Date: 09/08/2020    ECHOCARDIOGRAM REPORT   Patient Name:   Ethridge E Curtice Date of Exam: 09/08/2020 Medical Rec #:  [id number]    Height:       69.0 in Accession #:    [id number]   Weight:       175.5 lb Date of Birth:  11/17/1932    BSA:           1.954 m Patient Age:    87 years     BP:           120/51 mmHg Patient Gender: M            HR:           51  bpm. Exam Location:  Inpatient Procedure: 2D Echo, Cardiac Doppler and Color Doppler Indications:    R06.02 SOB  History:        Patient has prior history of Echocardiogram examinations, most                 recent 07/27/2019. Cardiomyopathy and CHF, CAD, Abnormal ECG,  Aortic Valve Disease, Arrythmias:Atrial Fibrillation and LBBB,                 Signs/Symptoms:Chest Pain; Risk Factors:Hypertension, Diabetes                 and Dyslipidemia. Cancer. Aortic stenosis.  Sonographer:    Roseanna Rainbow RDCS Referring Phys: [id number] [place]  Sonographer Comments: Technically difficult study due to poor echo windows, no subcostal window and suboptimal apical window. IMPRESSIONS  1. Left ventricular ejection fraction, by estimation, is 50 to 55%. The left ventricle has low normal function. The left ventricle demonstrates regional wall motion abnormalities (see scoring diagram/findings for description). There is severe left ventricular hypertrophy. Left ventricular diastolic function could not be evaluated. There is severe hypokinesis of the left ventricular, basal-mid inferior wall.  2. Right ventricular systolic function is moderately reduced. The right ventricular size is normal.  3. Right atrial size was mildly dilated.  4. The mitral valve is abnormal. Trivial mitral valve regurgitation.  5. The aortic valve is calcified. Aortic valve regurgitation is not visualized. Moderate to severe aortic valve stenosis. Aortic valve area, by VTI measures 0.87 cm. Aortic valve mean gradient measures 22.0 mmHg. Aortic valve Vmax measures 2.99 m/s. DI  of 0.25.  6. Aortic dilatation noted. There is mild dilatation of the ascending aorta, measuring 40 mm.  7. The inferior vena cava is dilated in size with <50% respiratory variability, suggesting right atrial pressure of 15 mmHg. Comparison(s): Changes from  prior study are noted. 07/27/2019: LVEF 60-65%, severe LVH, mild AS - peak and mean gradients of 15/26 mmHg. FINDINGS  Left Ventricle: Left ventricular ejection fraction, by estimation, is 50 to 55%. The left ventricle has low normal function. The left ventricle demonstrates regional wall motion abnormalities. Severe hypokinesis of the left ventricular, basal-mid inferior wall. The left ventricular internal cavity size was normal in size. There is severe left ventricular hypertrophy. Abnormal (paradoxical) septal motion, consistent with left bundle branch block. Left ventricular diastolic function could not be evaluated due to atrial fibrillation. Left ventricular diastolic function could not be evaluated. Right Ventricle: The right ventricular size is normal. No increase in right ventricular wall thickness. Right ventricular systolic function is moderately reduced. Left Atrium: Left atrial size was normal in size. Right Atrium: Right atrial size was mildly dilated. Pericardium: There is no evidence of pericardial effusion. Mitral Valve: The mitral valve is abnormal. Mild to moderate mitral annular calcification. Trivial mitral valve regurgitation. Tricuspid Valve: The tricuspid valve is grossly normal. Tricuspid valve regurgitation is not demonstrated. Aortic Valve: The aortic valve is calcified. Aortic valve regurgitation is not visualized. Moderate to severe aortic stenosis is present. Aortic valve mean gradient measures 22.0 mmHg. Aortic valve peak gradient measures 35.8 mmHg. Aortic valve area, by VTI measures 0.87 cm. Pulmonic Valve: The pulmonic valve was grossly normal. Pulmonic valve regurgitation is trivial. Aorta: Aortic dilatation noted. There is mild dilatation of the ascending aorta, measuring 40 mm. Venous: The inferior vena cava is dilated in size with less than 50% respiratory variability, suggesting right atrial pressure of 15 mmHg. IAS/Shunts: No atrial level shunt detected by color flow Doppler.   LEFT VENTRICLE PLAX 2D LVIDd:         3.80 cm LVIDs:         3.15 cm LV PW:         1.90 cm LV IVS:        2.00 cm LVOT diam:     2.10 cm  LV SV:         57 LV SV Index:   29 LVOT Area:     3.46 cm  LV Volumes (MOD) LV vol d, MOD A2C: 115.0 ml LV vol d, MOD A4C: 117.0 ml LV vol s, MOD A2C: 47.4 ml LV vol s, MOD A4C: 51.6 ml LV SV MOD A2C:     67.6 ml LV SV MOD A4C:     117.0 ml LV SV MOD BP:      64.7 ml RIGHT VENTRICLE            IVC RV S prime:     5.12 cm/s  IVC diam: 2.40 cm TAPSE (M-mode): 0.7 cm LEFT ATRIUM             Index       RIGHT ATRIUM           Index LA diam:        5.00 cm 2.56 cm/m  RA Area:     21.30 cm LA Vol (A2C):   57.7 ml 29.52 ml/m RA Volume:   67.00 ml  34.28 ml/m LA Vol (A4C):   62.3 ml 31.88 ml/m LA Biplane Vol: 62.0 ml 31.72 ml/m  AORTIC VALVE AV Area (Vmax):    0.86 cm AV Area (Vmean):   0.80 cm AV Area (VTI):     0.87 cm AV Vmax:           299.00 cm/s AV Vmean:          226.000 cm/s AV VTI:            0.659 m AV Peak Grad:      35.8 mmHg AV Mean Grad:      22.0 mmHg LVOT Vmax:         74.20 cm/s LVOT Vmean:        52.300 cm/s LVOT VTI:          0.166 m LVOT/AV VTI ratio: 0.25  AORTA Ao Root diam: 3.30 cm Ao Asc diam:  4.00 cm MITRAL VALVE MV Area (PHT): 4.52 cm     SHUNTS MV Decel Time: 168 msec     Systemic VTI:  0.17 m MV E velocity: 117.00 cm/s  Systemic Diam: 2.10 cm Lyman Bishop MD Electronically signed by Lyman Bishop MD Signature Date/Time: 09/08/2020/10:59:23 AM    Final     Procedures Procedures (including critical care time)  Medications Ordered in ED Medications  ipratropium-albuterol (DUONEB) 0.5-2.5 (3) MG/3ML nebulizer solution 3 mL (has no administration in time range)  amLODipine (NORVASC) tablet 5 mg (5 mg Oral Given 09/08/20 0958)  nitroGLYCERIN (NITROSTAT) SL tablet 0.4 mg (has no administration in time range)  rosuvastatin (CRESTOR) tablet 20 mg (20 mg Oral Given 09/08/20 0958)  spironolactone (ALDACTONE) tablet 25 mg (25 mg Oral Given  09/08/20 0958)  pantoprazole (PROTONIX) EC tablet 40 mg (40 mg Oral Given 09/08/20 0957)  docusate sodium (COLACE) capsule 100 mg (has no administration in time range)  apixaban (ELIQUIS) tablet 2.5 mg (2.5 mg Oral Given 09/08/20 0957)  clopidogrel (PLAVIX) tablet 75 mg (75 mg Oral Given 09/08/20 0958)  mometasone-formoterol (DULERA) 200-5 MCG/ACT inhaler 2 puff (2 puffs Inhalation Not Given 09/08/20 0847)  albuterol (PROVENTIL) (2.5 MG/3ML) 0.083% nebulizer solution 2.5 mg (has no administration in time range)  polyvinyl alcohol (LIQUIFILM TEARS) 1.4 % ophthalmic solution 1-2 drop (has no administration in time range)  sodium chloride flush (NS) 0.9 % injection 3 mL (3 mLs Intravenous Given 09/08/20  1000)  acetaminophen (TYLENOL) tablet 650 mg (650 mg Oral Given 09/07/20 2147)    Or  acetaminophen (TYLENOL) suppository 650 mg ( Rectal See Alternative 09/07/20 2147)  furosemide (LASIX) injection 40 mg (40 mg Intravenous Given 09/08/20 0745)  carvedilol (COREG) tablet 6.25 mg (6.25 mg Oral Given 09/08/20 0745)  insulin aspart (novoLOG) injection 0-15 Units (11 Units Subcutaneous Given 09/08/20 0646)  ondansetron (ZOFRAN) injection 4 mg (4 mg Intravenous Given 09/07/20 2145)  melatonin tablet 3 mg (3 mg Oral Given 09/07/20 2327)  predniSONE (DELTASONE) tablet 20 mg (has no administration in time range)  cefdinir (OMNICEF) capsule 300 mg (has no administration in time range)  aspirin chewable tablet 324 mg (324 mg Oral Given 09/07/20 1624)  methylPREDNISolone sodium succinate (SOLU-MEDROL) 125 mg/2 mL injection 125 mg (125 mg Intravenous Given 09/07/20 2150)  furosemide (LASIX) injection 40 mg (40 mg Intravenous Given 09/07/20 2150)    ED Course  I have reviewed the triage vital signs and the nursing notes.  Pertinent labs & imaging results that were available during my care of the patient were reviewed by me and considered in my medical decision making (see chart for details).    MDM  Rules/Calculators/A&P                          86 year old male with history of CAD, COPD/asthma, atrial fibrillation on eliquis, CKD, hypertension, DM, who presents with chest tightness and dyspnea worsened with exertion and fever at home over the last 3 days.  Differential diagnosis for dyspnea includes ACS, PE, COPD exacerbation, CHF exacerbation, anemia, pneumonia, viral etiology such as COVID 19 infection, metabolic abnormality.  Chest x-ray was done which showed no significant findings. EKG was evaluated by me which showed atrial fibrillation, no acute changes.  BNP was elevated to 400s, similar to prior admission for CHF.Marland Kitchen  Doubt PE given compliance with eliquis.  Troponin mildly elevated, similar to prior admission for CHF.    Has wheezing on exam, consider COPD-given solumedrol/duonebs--although feel cardiac etiology more likely than COPD by history.   Hx of exertional chest tightness and dyspnea concerning for primary cardiac etiology. Given aspirin . BNP elevated similar to prior admission for CHF, given IV lasix. Consider Cardiology consult in AM for anginal type symptoms.  Reports fevers--no fever in ED, unclear etiology. Blood cx and urine pending. No sign of pneumonia, COVID/flu testing negative.  Will admit for tachypnea, severe dyspnea on exertion, elevated troponin, likely multifactorial etiology of symptoms.    Final Clinical Impression(s) / ED Diagnoses Final diagnoses:  Shortness of breath  Elevated brain natriuretic peptide (BNP) level  Troponin level elevated    Rx / DC Orders ED Discharge Orders    None       Gareth Morgan, MD 09/08/20 1131

## 2020-09-07 NOTE — Progress Notes (Addendum)
   09/07/20 2135  Provider Notification  Provider Name/Title Dr Myna Hidalgo  Date Provider Notified 09/07/20  Time Provider Notified 2135  Notification Type Page  Notification Reason Other (Comment) (pt. actively vomiting)  Response Other (Comment) (waiting)    Update:  Zofran 4 mg Q6 PRN

## 2020-09-07 NOTE — ED Triage Notes (Signed)
Pt arrives to ED with chief complaint of fever, shortness of breath, cp, palpations and feeling weak since Friday evening. Pt took a home covid test last night and it was negative. Pt had a home temp of 100.9 last night. He states his heart feeling like it is beating fast at times and has pain in left side of chest and back.

## 2020-09-07 NOTE — H&P (Signed)
History and Physical   [name] XBM:[id number] DOB: 01-Jul-1933 DOA: 09/07/2020  PCP: [name], MD   Patient coming from: Home  Chief Complaint: Chest pain, shortness of breath  HPI: [name] is a 86 y.o. male with medical history significant of diastolic heart failure, CKD 3, carotid artery stenosis, CAD status post stent x5 and CABG x4, hyperlipidemia, GERD, hypertension, paroxysmal A. fib on Eliquis, diabetes, COPD who presents with increased dyspnea on exertion and chest pain on exertion.  Patient states he has had some intermittent shortness of breath including exertion for about a year he has been started on inhalers for COPD.  He is also had some chest pain on exertion for several months at least.  Both of these have worsened for the past couple of days and he has noticed it is improved with nitro and worse with exertion.  He does report a fever each of the last 3 days though none has been seen thus far here in ED.  He denies significant weight gain, change in his intake, significant orthopnea.  Symptoms are somewhat similar to previous heart failure exacerbation last October.  He reports some palpitations when he knows he had a fever.  He denies cough, abdominal pain, constipation, diarrhea.  He is not a smoker as not consume alcohol.  ED Course: Vital signs in the ED significant for mild tachypnea in the low 20s only.  Labs showed sodium of 128 which corrects to around 130 given glucose of 266, creatinine of 2 which is his baseline.  LFTs showed 3.32blef2.1. CBC showed mild leukocytosis to 11.5 and PLT of 120.  Troponins 79 and 89.  BNP 426.  Respiratory panel for flu and Covid negative.  Urine analysis, urine culture, blood cultures ordered but not yet obtained.  Chest x-ray was read as no acute disease, though there may be some mild interstitial edema.  ED provider reported hearing wheezing on exam and had started nebulizer and dose of IV steroids.  He is also received a dose  of IV Lasix.  Review of Systems: As per HPI otherwise all other systems reviewed and are negative.  Past Medical History:  Diagnosis Date  . Arthritis   . CAD (coronary artery disease)    a. Cath September 2015 LIMA to the LAD patent, SVG to PDA patent, SVG to posterior lateral patent, SVG to OM with a 90% in-stent restenosis at an anastomotic lesion. This was treated with angioplasty. b. cath 04/01/2015 95% ISR in SVG to OM treated with 2.75x24 Synergy DES postdilated to 3.36mm, all other grafts patent  . Cataract    surgery,B/L  . CHF (congestive heart failure) ([place])   . Chronic kidney disease    nephrolithiasis  . Diabetes mellitus    TYPE 2  . GERD (gastroesophageal reflux disease)   . Heart murmur   . Hernia   . Hypercholesterolemia   . Hypertension   . Incontinence    hx  over 1 year,leaks without awareness  . Other and unspecified diseases of appendix   . PAF (paroxysmal atrial fibrillation) ([place])    in the setting of ischemia on 03/31/2015  . Pancreatitis   . Prostate CA ([place]) 03/01/04   prostate bx=Adenocarcinoam,gleason 3+4=7,PSA=6.75  . Shortness of breath dyspnea   . Ulcer    hx gastric    Past Surgical History:  Procedure Laterality Date  . APPENDECTOMY    . BACK SURGERY    . CARDIAC CATHETERIZATION N/A 04/01/2015   Procedure:  Left Heart Cath and Cors/Grafts Angiography;  Surgeon: Jettie Booze, MD;  Location: [facility];  Service: Cardiovascular;  Laterality: N/A;  . CARDIAC CATHETERIZATION N/A 04/01/2015   Procedure: Coronary Stent Intervention;  Surgeon: Jettie Booze, MD;  Location: [facility];  Service: Cardiovascular;  Laterality: N/A;  . CARDIOVERSION N/A 11/01/2019   Procedure: CARDIOVERSION;  Surgeon: Geralynn Rile, MD;  Location: [facility];  Service: Cardiovascular;  Laterality: N/A;  . CARPAL TUNNEL RELEASE  2004   both hands  . CORONARY ARTERY BYPASS GRAFT    . CYSTOSCOPY  08/23/12   incomplete emoptying bladder    . HERNIA REPAIR    . incision and drainage of right chest abscess    . LAPAROSCOPIC CHOLECYSTECTOMY  09/2009  . LEFT HEART CATHETERIZATION WITH CORONARY ANGIOGRAM N/A 07/19/2014   Procedure: LEFT HEART CATHETERIZATION WITH CORONARY ANGIOGRAM;  Surgeon: Leonie Man, MD;  Location: [facility] C[facility];  Service: Cardiovascular;  Laterality: N/A;  . RECTAL SURGERY    . ROBOT ASSISTED LAPAROSCOPIC RADICAL PROSTATECTOMY  2005    Social History  reports that he quit smoking about 48 years ago. His smoking use included cigarettes. He has a 2.50 pack-year smoking history. He has never used smokeless tobacco. He reports that he does not drink alcohol and does not use drugs.  Allergies  Allergen Reactions  . Codeine Other (See Comments)    Makes him feel goofy  . Morphine Other (See Comments)    Nightmares and felt bad    Family History  Problem Relation Age of Onset  . Cancer Mother        stomach  Reviewed on admission  Prior to Admission medications   Medication Sig Start Date End Date Taking? Authorizing Provider  acetaminophen (TYLENOL) 325 MG tablet Take 2 tablets (650 mg total) by mouth every 6 (six) hours as needed for mild pain, fever or headache. 07/28/19   Ivor Costa, MD  albuterol (PROVENTIL) (2.5 MG/3ML) 0.083% nebulizer solution Take 3 mLs (2.5 mg total) by nebulization every 4 (four) hours as needed for wheezing or shortness of breath. 07/28/19   Ivor Costa, MD  albuterol (VENTOLIN HFA) 108 (90 Base) MCG/ACT inhaler Inhale 2 puffs into the lungs every 6 (six) hours as needed. 08/12/20   Spero Geralds, MD  amLODipine (NORVASC) 5 MG tablet Take 1 tablet (5 mg total) by mouth daily. 07/30/20   Burchette, Alinda Sierras, MD  budesonide-formoterol (SYMBICORT) 160-4.5 MCG/ACT inhaler Inhale 2 puffs into the lungs in the morning and at bedtime. 08/07/20   Spero Geralds, MD  carvedilol (COREG) 6.25 MG tablet TAKE 1 TABLET (6.25 MG TOTAL) BY MOUTH 2 (TWO) TIMES DAILY WITH A MEAL. 08/27/20    [name], MD  clopidogrel (PLAVIX) 75 MG tablet TAKE 1 TABLET (75 MG TOTAL) BY MOUTH DAILY. 03/14/20   Burchette, Alinda Sierras, MD  Continuous Blood Gluc Receiver (FREESTYLE LIBRE 14 DAY READER) DEVI 1 Device by Does not apply route every 14 (fourteen) days. 04/28/20   Burchette, Alinda Sierras, MD  Continuous Blood Gluc Sensor (FREESTYLE LIBRE 14 DAY SENSOR) MISC 1 Device by Does not apply route every 14 (fourteen) days. 04/28/20   Burchette, Alinda Sierras, MD  docusate sodium (STOOL SOFTENER) 100 MG capsule Take 1 capsule (100 mg total) by mouth 2 (two) times daily as needed for mild constipation. 07/28/19   Ivor Costa, MD  ELIQUIS 2.5 MG TABS tablet TAKE 1 TABLET BY MOUTH TWICE A DAY 08/27/20  Burchette, Alinda Sierras, MD  furosemide (LASIX) 40 MG tablet Take 1 tablet (40 mg total) by mouth 2 (two) times daily. 07/30/20   Burchette, Alinda Sierras, MD  glucose blood (FREESTYLE LITE) test strip 1 each by Other route as needed. Use as instructed     [provider]  Lancets (FREESTYLE) lancets 1 each by Other route as needed. Use as instructed     [provider]  levalbuterol Penne Lash) 0.63 MG/3ML nebulizer solution Take 3 mLs (0.63 mg total) by nebulization every 6 (six) hours as needed for shortness of breath. 01/08/20   Spero Geralds, MD  Multiple Vitamins-Minerals (ICAPS AREDS 2 PO) Take 1 capsule by mouth daily.    [provider]  nitroGLYCERIN (NITROSTAT) 0.4 MG SL tablet Place 1 tablet (0.4 mg total) under the tongue every 5 (five) minutes x 3 doses as needed for chest pain (if no relief after 3rd dose, proceed to the ED for an evaluation). 04/17/18   [name], MD  pantoprazole (PROTONIX) 40 MG tablet TAKE 1 TABLET BY MOUTH EVERY DAY 07/22/20   Burchette, Alinda Sierras, MD  Polyethyl Glycol-Propyl Glycol (LUBRICANT EYE DROPS) 0.4-0.3 % SOLN Place 1-2 drops into both eyes 3 (three) times daily as needed (dry/irritated eyes.).    [provider]  rosuvastatin (CRESTOR) 20 MG tablet  TAKE 1 TABLET BY MOUTH EVERY DAY 08/27/20   Burchette, Alinda Sierras, MD  sitaGLIPtin (JANUVIA) 50 MG tablet Take 1 tablet (50 mg total) by mouth daily. 03/10/20   Burchette, Alinda Sierras, MD  spironolactone (ALDACTONE) 25 MG tablet TAKE 1 TABLET BY MOUTH EVERY DAY 05/12/20   [name], MD  traMADol (ULTRAM) 50 MG tablet Take 1 tablet (50 mg total) by mouth every 6 (six) hours as needed. 1-2 every 6 h prn pain 11/04/11   [name], MD    Physical Exam: Vitals:   09/07/20 1945 09/07/20 1952 09/07/20 1953 09/07/20 2000  BP: (!) 156/72   131/75  Pulse: 88 87  77  Resp: (!) 31 (!) 25  (!) 22  Temp:   98.6 F (37 C)   TempSrc:   Oral   SpO2: 94% 94%  95%  Weight:      Height:       Physical Exam Constitutional:      General: He is not in acute distress.    Appearance: Normal appearance.  HENT:     Head: Normocephalic and atraumatic.     Mouth/Throat:     Mouth: Mucous membranes are moist.     Pharynx: Oropharynx is clear.  Eyes:     Extraocular Movements: Extraocular movements intact.     Pupils: Pupils are equal, round, and reactive to light.  Cardiovascular:     Rate and Rhythm: Normal rate and regular rhythm.     Pulses: Normal pulses.     Heart sounds: Normal heart sounds.  Pulmonary:     Effort: Pulmonary effort is normal. No respiratory distress.     Comments: Bibasilar rales Abdominal:     General: Bowel sounds are normal. There is no distension.     Palpations: Abdomen is soft.     Tenderness: There is no abdominal tenderness.  Musculoskeletal:        General: No swelling or deformity.  Skin:    General: Skin is warm and dry.  Neurological:     General: No focal deficit present.     Mental Status: Mental status is at baseline.  Labs on Admission: I have personally reviewed following labs and imaging studies  CBC: Recent Labs  Lab 09/07/20 1422  WBC 11.5*  HGB 13.5  HCT 39.2  MCV 90.3  PLT 120*    Basic Metabolic Panel: Recent Labs  Lab  09/07/20 1422  NA 128*  K 3.8  CL 95*  CO2 21*  GLUCOSE 265*  BUN 29*  CREATININE 2.01*  CALCIUM 9.0    GFR: Estimated Creatinine Clearance: 26.9 mL/min (A) (by C-G formula based on SCr of 2.01 mg/dL (H)).  Liver Function Tests: Recent Labs  Lab 09/07/20 1619  AST 16  ALT 22  ALKPHOS 75  BILITOT 2.1*  PROT 7.0  ALBUMIN 3.3*    Urine analysis:    Component Value Date/Time   COLORURINE YELLOW 07/26/2019 1111   APPEARANCEUR CLEAR 07/26/2019 1111   LABSPEC 1.009 07/26/2019 1111   PHURINE 6.0 07/26/2019 1111   GLUCOSEU NEGATIVE 07/26/2019 1111   HGBUR NEGATIVE 07/26/2019 1111   HGBUR trace-intact 11/23/2007 0000   BILIRUBINUR NEGATIVE 07/26/2019 1111   KETONESUR NEGATIVE 07/26/2019 1111   PROTEINUR 30 (A) 07/26/2019 1111   UROBILINOGEN 0.2 02/16/2011 2229   NITRITE NEGATIVE 07/26/2019 1111   LEUKOCYTESUR NEGATIVE 07/26/2019 1111    Radiological Exams on Admission: DG Chest Portable 1 View  Result Date: 09/07/2020 CLINICAL DATA:  Shortness of breath EXAM: PORTABLE CHEST 1 VIEW COMPARISON:  April 02, 2020 FINDINGS: No pneumothorax. Cardiomegaly. The hila and mediastinum are unremarkable. No pulmonary nodules, masses, focal infiltrates, or overt edema. IMPRESSION: No active disease. Electronically Signed   By: Dorise Bullion III M.D   On: 09/07/2020 16:44    EKG: Independently reviewed.  Atrial fibrillation, left bundle branch block.  Similar to previous.  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 (HCC)   Dyslipidemia   HTN (hypertension)   CAD in native artery   Hx GERD/PUD   DOE (dyspnea on exertion)   Chest pain   PAF (paroxysmal atrial fibrillation) (HCC)   Coronary artery disease of native artery of native heart with stable angina pectoris (HCC)   CKD (chronic kidney disease), stage III (HCC)   COPD mixed type  (HCC)  CHF exacerbation > Mild bibasilar rales on exam, mild pulmonary edema on chest x-ray, BNP of 426 > Last echo in October 2297 with diastolic dysfunction EF 98-92% > No significant reported weight gain nor edema - Received 40 mg IV Lasix in the ED - Plan to continue 40 mg daily tomorrow, can increase pending response - Repeat echocardiogram - Daily weights - I/Os - Trend BMP  Hyperlipidemia Carotid artery stenosis Chest pain, rule out ACS CAD status post stent x5 and CABG x4 > Patient with exertional chest pain and dyspnea and history of significant CAD  > Initial troponin was only mildly elevated to 79 and 89 similar to previous CHF exacerbation in October 2020 - Will likely benefit from cardiology consults in the morning - Continue to trend troponin - EKG as needed for chest pain - Nitroglycerin as needed for chest pain - We will consult cardiology sooner if significant troponin bump - Continue home carvedilol, rosuvastatin, Plavix  Fevers ?  COPD exacerbation > EDP noted wheezing on exam, this is improved by the time I saw the patient > Received dose of Solu-Medrol and DuoNebs in ED > Covid and flu negative - Replace home Symbicort with formulary Dulera - As  needed albuterol - Add on full RVP - Trend fever curve and WBC  Reported fevers and leukocytosis > No signs of pneumonia on chest x-ray no reported urinary symptoms > Urinalysis and urine culture obtained as well as blood cultures > No signs of sepsis nor meningitic symptoms - We will check RVP as above - Continue to trend fever curve and white count, white count will increase given dose of steroids in ED  CKD 3  > Creatinine stable at 2 - Avoid nephrotoxic agents - Trend renal function  GERD - Continue PPI  Hypertension - Continue home amlodipine, Coreg, spironolactone - Lasix as above  Paroxysmal A. fib on Eliquis - Continue home Coreg and Eliquis  Diabetes - Hold p.o. medications - SSI  DVT  prophylaxis: Eliquis  Code Status:   Full  Family Communication:  Wife updated at bedside  Disposition Plan:   Patient is from:  Home  Anticipated DC to:  Home  Anticipated DC date:  2 days  Anticipated DC barriers: None  Consults called:  None, will benefit from cardiology in the morning or earlier if significant lab changes Admission status:  Inpatient, telemetry  Severity of Illness: The appropriate patient status for this patient is INPATIENT. Inpatient status is judged to be reasonable and necessary in order to provide the required intensity of service to ensure the patient's safety. The patient's presenting symptoms, physical exam findings, and initial radiographic and laboratory data in the context of their chronic comorbidities is felt to place them at high risk for further clinical deterioration. Furthermore, it is not anticipated that the patient will be medically stable for discharge from the hospital within 2 midnights of admission. The following factors support the patient status of inpatient.   " The patient's presenting symptoms include chest pain on exertion, dyspnea on exertion. " The worrisome physical exam findings include bibasilar rales. " The initial radiographic and laboratory data are worrisome because of pulmonary edema, mild hyponatremia, mild leukocytosis, elevated BNP, mildly elevated troponin. " The chronic co-morbidities include CHF, CKD, CAD, hypertension, A. fib, diabetes, COPD.   * I certify that at the point of admission it is my clinical judgment that the patient will require inpatient hospital care spanning beyond 2 midnights from the point of admission due to high intensity of service, high risk for further deterioration and high frequency of surveillance required.Marcelyn Bruins MD [facility]  How to contact the [facility] Attending or Consulting provider [place] or covering provider during after hours [place], for this patient?   1. Check the  care team in [facility] and look for a) attending/consulting [facility] provider listed and b) the [facility] team listed 2. Log into [email or web address] and use [place] universal password to access. If you do not have the password, please contact the hospital operator. 3. Locate the [facility] provider you are looking for under [facility] and page to a number that you can be directly reached. 4. If you still have difficulty reaching the provider, please page the [facility] (Director on Call) for the Hospitalists listed on amion for assistance.  09/07/2020, 8:38 PM

## 2020-09-07 NOTE — ED Notes (Signed)
Pt reports fever since Fri, CP x months, shob x about a year

## 2020-09-08 ENCOUNTER — Ambulatory Visit: Payer: Medicare PPO

## 2020-09-08 ENCOUNTER — Other Ambulatory Visit: Payer: Self-pay | Admitting: Physician Assistant

## 2020-09-08 ENCOUNTER — Inpatient Hospital Stay (HOSPITAL_BASED_OUTPATIENT_CLINIC_OR_DEPARTMENT_OTHER): Payer: Medicare PPO

## 2020-09-08 DIAGNOSIS — J440 Chronic obstructive pulmonary disease with acute lower respiratory infection: Secondary | ICD-10-CM

## 2020-09-08 DIAGNOSIS — I130 Hypertensive heart and chronic kidney disease with heart failure and stage 1 through stage 4 chronic kidney disease, or unspecified chronic kidney disease: Secondary | ICD-10-CM | POA: Diagnosis not present

## 2020-09-08 DIAGNOSIS — I350 Nonrheumatic aortic (valve) stenosis: Secondary | ICD-10-CM | POA: Diagnosis not present

## 2020-09-08 DIAGNOSIS — I25118 Atherosclerotic heart disease of native coronary artery with other forms of angina pectoris: Secondary | ICD-10-CM | POA: Diagnosis not present

## 2020-09-08 DIAGNOSIS — J209 Acute bronchitis, unspecified: Secondary | ICD-10-CM

## 2020-09-08 DIAGNOSIS — N183 Chronic kidney disease, stage 3 unspecified: Secondary | ICD-10-CM | POA: Diagnosis not present

## 2020-09-08 DIAGNOSIS — J449 Chronic obstructive pulmonary disease, unspecified: Secondary | ICD-10-CM | POA: Diagnosis not present

## 2020-09-08 DIAGNOSIS — I25119 Atherosclerotic heart disease of native coronary artery with unspecified angina pectoris: Secondary | ICD-10-CM | POA: Diagnosis not present

## 2020-09-08 DIAGNOSIS — N1831 Chronic kidney disease, stage 3a: Secondary | ICD-10-CM

## 2020-09-08 DIAGNOSIS — R7989 Other specified abnormal findings of blood chemistry: Secondary | ICD-10-CM | POA: Diagnosis not present

## 2020-09-08 DIAGNOSIS — R0600 Dyspnea, unspecified: Secondary | ICD-10-CM

## 2020-09-08 DIAGNOSIS — Z20822 Contact with and (suspected) exposure to covid-19: Secondary | ICD-10-CM | POA: Diagnosis not present

## 2020-09-08 DIAGNOSIS — I5043 Acute on chronic combined systolic (congestive) and diastolic (congestive) heart failure: Secondary | ICD-10-CM | POA: Diagnosis not present

## 2020-09-08 DIAGNOSIS — R0602 Shortness of breath: Secondary | ICD-10-CM | POA: Diagnosis not present

## 2020-09-08 DIAGNOSIS — N184 Chronic kidney disease, stage 4 (severe): Secondary | ICD-10-CM | POA: Diagnosis not present

## 2020-09-08 DIAGNOSIS — I5022 Chronic systolic (congestive) heart failure: Secondary | ICD-10-CM | POA: Diagnosis not present

## 2020-09-08 DIAGNOSIS — I5033 Acute on chronic diastolic (congestive) heart failure: Secondary | ICD-10-CM | POA: Diagnosis not present

## 2020-09-08 DIAGNOSIS — E785 Hyperlipidemia, unspecified: Secondary | ICD-10-CM

## 2020-09-08 DIAGNOSIS — I10 Essential (primary) hypertension: Secondary | ICD-10-CM

## 2020-09-08 DIAGNOSIS — E1121 Type 2 diabetes mellitus with diabetic nephropathy: Secondary | ICD-10-CM | POA: Diagnosis not present

## 2020-09-08 DIAGNOSIS — E1122 Type 2 diabetes mellitus with diabetic chronic kidney disease: Secondary | ICD-10-CM | POA: Diagnosis not present

## 2020-09-08 LAB — COMPREHENSIVE METABOLIC PANEL
ALT: 19 U/L (ref 0–44)
AST: 14 U/L — ABNORMAL LOW (ref 15–41)
Albumin: 3.1 g/dL — ABNORMAL LOW (ref 3.5–5.0)
Alkaline Phosphatase: 67 U/L (ref 38–126)
Anion gap: 13 (ref 5–15)
BUN: 32 mg/dL — ABNORMAL HIGH (ref 8–23)
CO2: 21 mmol/L — ABNORMAL LOW (ref 22–32)
Calcium: 9.3 mg/dL (ref 8.9–10.3)
Chloride: 98 mmol/L (ref 98–111)
Creatinine, Ser: 2.2 mg/dL — ABNORMAL HIGH (ref 0.61–1.24)
GFR, Estimated: 29 mL/min — ABNORMAL LOW (ref >60)
Glucose, Bld: 261 mg/dL — ABNORMAL HIGH (ref 70–99)
Potassium: 4 mmol/L (ref 3.5–5.1)
Sodium: 132 mmol/L — ABNORMAL LOW (ref 135–145)
Total Bilirubin: 1.8 mg/dL — ABNORMAL HIGH (ref 0.3–1.2)
Total Protein: 6.9 g/dL (ref 6.5–8.1)

## 2020-09-08 LAB — CBC
HCT: 37.8 % — ABNORMAL LOW (ref 39.0–52.0)
Hemoglobin: 12.9 g/dL — ABNORMAL LOW (ref 13.0–17.0)
MCH: 30.1 pg (ref 26.0–34.0)
MCHC: 34.1 g/dL (ref 30.0–36.0)
MCV: 88.1 fL (ref 80.0–100.0)
Platelets: 114 10*3/uL — ABNORMAL LOW (ref 150–400)
RBC: 4.29 MIL/uL (ref 4.22–5.81)
RDW: 12.4 % (ref 11.5–15.5)
WBC: 11.1 10*3/uL — ABNORMAL HIGH (ref 4.0–10.5)
nRBC: 0 % (ref 0.0–0.2)

## 2020-09-08 LAB — ECHOCARDIOGRAM COMPLETE
AR max vel: 0.86 cm2
AV Area VTI: 0.87 cm2
AV Area mean vel: 0.8 cm2
AV Mean grad: 22 mmHg
AV Peak grad: 35.8 mmHg
Ao pk vel: 2.99 m/s
Area-P 1/2: 4.52 cm2
Calc EF: 55.9 %
Height: 69 in
S' Lateral: 3.15 cm
Single Plane A2C EF: 58.8 %
Single Plane A4C EF: 55.9 %
Weight: 2807.78 oz

## 2020-09-08 LAB — GLUCOSE, CAPILLARY
Glucose-Capillary: 314 mg/dL — ABNORMAL HIGH (ref 70–99)
Glucose-Capillary: 371 mg/dL — ABNORMAL HIGH (ref 70–99)
Glucose-Capillary: 256 mg/dL — ABNORMAL HIGH (ref 70–99)

## 2020-09-08 LAB — MAGNESIUM: Magnesium: 2 mg/dL (ref 1.7–2.4)

## 2020-09-08 MED ORDER — GUAIFENESIN-DM 100-10 MG/5ML PO SYRP: 5.0000 mL | ORAL_SOLUTION | Freq: Four times a day (QID) | ORAL | 0 refills | Status: DC | PRN

## 2020-09-08 MED ORDER — PREDNISONE 20 MG PO TABS: 20.0000 mg | ORAL_TABLET | Freq: Every day | ORAL | Status: DC

## 2020-09-08 MED ORDER — FREESTYLE LIBRE 14 DAY READER DEVI: 1.0000 | 3 refills | Status: DC

## 2020-09-08 MED ORDER — CEFDINIR 300 MG PO CAPS
300.0000 mg | ORAL_CAPSULE | Freq: Every day | ORAL | 0 refills | Status: DC
Start: 2020-09-09 — End: 2020-09-29

## 2020-09-08 MED ORDER — FREESTYLE LIBRE 14 DAY SENSOR MISC: 1.0000 | 3 refills | Status: DC

## 2020-09-08 MED ORDER — FREESTYLE LANCETS MISC
12 refills | Status: DC
Start: 2020-09-08 — End: 2020-09-08

## 2020-09-08 MED ORDER — FREESTYLE LITE TEST VI STRP
ORAL_STRIP | 12 refills | Status: DC
Start: 2020-09-08 — End: 2020-09-08

## 2020-09-08 MED ORDER — CEFDINIR 300 MG PO CAPS
300.0000 mg | ORAL_CAPSULE | Freq: Every day | ORAL | Status: DC
  Administered 2020-09-08: 300 mg via ORAL
  Filled 2020-09-08: qty 1

## 2020-09-08 MED ORDER — BLOOD GLUCOSE MONITOR KIT: PACK | 0 refills | Status: DC

## 2020-09-08 NOTE — Care Management Obs Status (Signed)
[place] NOTIFICATION   Patient Details  Name: [name] MRN: [id number] Date of Birth: [birth year]   Medicare Observation Status Notification Given:  Yes    Zenon Mayo, RN 09/08/2020, 3:22 PM

## 2020-09-08 NOTE — Progress Notes (Signed)
Pt has successfully walked 6 min room air at 95% and asymptomatic, dr Earnest Conroy advised

## 2020-09-08 NOTE — Consult Note (Addendum)
Cardiology Consultation:   Patient ID: [name] MRN: [id number]; DOB: [birth year]  Admit date: 09/07/2020 Date of Consult: 09/08/2020  Primary Care Provider: [name], MD [facility] Cardiologist: [name], MD  [facility] Electrophysiologist:  None    Patient Profile:   [name] is a 86 y.o. male with a hx of CAD, PAF, DM2, HTN, HLD, CKD, and chronic systolic and diastolic heart failure who is being seen today for the evaluation of chest pain and elevated troponin at the request of Dr. Earnest Conroy.  History of Present Illness:   Mr. Luckman has a history of CAD s/p CABG with last heart cath 2016 that showed patent LIMA-LAD, SVG-PDA, and SVG to PDA. SVG to OM was 95% stenosed and treated with DES. Nuclear stress test 2018 showed fixed defect. Cardiopulmonary exercise test 2020 with submaximal effort during exercise and mixed restrictive/obstructive pattern. Due to Afib, he wore a heart monitor. Heart monitor 09/2019 showed 100% Afib with controlled ventricular rate and ventricular bigeminy and trigeminy. After he was adequately anticoagulated, he underwent DCCV 11/01/19. Immediately following DCCV, she remained in Afib. However, postprocedure EKG did show NSR. Unfortunately, he was back in Afib at clinic follow up in March 2021. Pt continued to have dyspnea, but Dr. Percival Spanish thought that this was not entirely due to Afib and sent him to pulmonology. Last echo 07/27/19 showed preserved EF, grade II DD, mildly dilated left atria, and mild AS.   He presented to [facility] with chest pain, shortness of breath, and found to have an elevated troponin. He has also had a fever of 100.9. Pt reports chronic dyspnea that has been worse over the preceding two days. Respiratory panel was negative, BNP 426. Cardiology was consulted.   On my interview, he developed a fever of 101.9 Friday evening that persisted despite fever reducing agents. In addition, he felt more short of breath and felt  palpitations. He has been having exertional chest pain relieved with nitro and rest for the past two months. CP described as a 3/10 - he didn't think it was severe enough to call our office. On admission, he was given steroids and IV lasix. He now feels much better and breathing is at baseline. CP is now described as a pressure 2/10.  He is eager to discharge home if no further cardiac workup necessary.    Past Medical History:  Diagnosis Date  . Arthritis   . CAD (coronary artery disease)    a. Cath September 2015 LIMA to the LAD patent, SVG to PDA patent, SVG to posterior lateral patent, SVG to OM with a 90% in-stent restenosis at an anastomotic lesion. This was treated with angioplasty. b. cath 04/01/2015 95% ISR in SVG to OM treated with 2.75x24 Synergy DES postdilated to 3.20mm, all other grafts patent  . Cataract    surgery,B/L  . CHF (congestive heart failure) ([place])   . Chronic kidney disease    nephrolithiasis  . Diabetes mellitus    TYPE 2  . GERD (gastroesophageal reflux disease)   . Heart murmur   . Hernia   . Hypercholesterolemia   . Hypertension   . Incontinence    hx  over 1 year,leaks without awareness  . Other and unspecified diseases of appendix   . PAF (paroxysmal atrial fibrillation) ([place])    in the setting of ischemia on 03/31/2015  . Pancreatitis   . Prostate CA ([place]) 03/01/04   prostate bx=Adenocarcinoam,gleason 3+4=7,PSA=6.75  . Shortness of breath dyspnea   .  Ulcer    hx gastric    Past Surgical History:  Procedure Laterality Date  . APPENDECTOMY    . BACK SURGERY    . CARDIAC CATHETERIZATION N/A 04/01/2015   Procedure: Left Heart Cath and Cors/Grafts Angiography;  Surgeon: Jettie Booze, MD;  Location: [place] CV [facility];  Service: Cardiovascular;  Laterality: N/A;  . CARDIAC CATHETERIZATION N/A 04/01/2015   Procedure: Coronary Stent Intervention;  Surgeon: Jettie Booze, MD;  Location: [place] [facility];  Service: Cardiovascular;  Laterality:  N/A;  . CARDIOVERSION N/A 11/01/2019   Procedure: CARDIOVERSION;  Surgeon: Geralynn Rile, MD;  Location: [place];  Service: Cardiovascular;  Laterality: N/A;  . CARPAL TUNNEL RELEASE  2004   both hands  . CORONARY ARTERY BYPASS GRAFT    . CYSTOSCOPY  08/23/12   incomplete emoptying bladder  . HERNIA REPAIR    . incision and drainage of right chest abscess    . LAPAROSCOPIC CHOLECYSTECTOMY  09/2009  . LEFT HEART CATHETERIZATION WITH CORONARY ANGIOGRAM N/A 07/19/2014   Procedure: LEFT HEART CATHETERIZATION WITH CORONARY ANGIOGRAM;  Surgeon: Leonie Man, MD;  Location: [facility] CATH LAB;  Service: Cardiovascular;  Laterality: N/A;  . RECTAL SURGERY    . ROBOT ASSISTED LAPAROSCOPIC RADICAL PROSTATECTOMY  2005     Home Medications:  Prior to Admission medications   Medication Sig Start Date End Date Taking? Authorizing Provider  acetaminophen (TYLENOL) 500 MG tablet Take 1,000 mg by mouth every 4 (four) hours as needed for fever or headache (pain).   Yes [provider]  albuterol (VENTOLIN HFA) 108 (90 Base) MCG/ACT inhaler Inhale 2 puffs into the lungs every 6 (six) hours as needed. Patient taking differently: Inhale 2 puffs into the lungs every 6 (six) hours as needed for wheezing or shortness of breath.  08/12/20  Yes Spero Geralds, MD  amLODipine (NORVASC) 10 MG tablet Take 5 mg by mouth daily.   Yes [provider]  budesonide-formoterol (SYMBICORT) 160-4.5 MCG/ACT inhaler Inhale 2 puffs into the lungs in the morning and at bedtime. 08/07/20  Yes Spero Geralds, MD  carvedilol (COREG) 6.25 MG tablet TAKE 1 TABLET (6.25 MG TOTAL) BY MOUTH 2 (TWO) TIMES DAILY WITH A MEAL. 08/27/20  Yes [name], MD  clopidogrel (PLAVIX) 75 MG tablet TAKE 1 TABLET (75 MG TOTAL) BY MOUTH DAILY. Patient taking differently: Take 75 mg by mouth daily.  03/14/20  Yes Burchette, Alinda Sierras, MD  docusate sodium (STOOL SOFTENER) 100 MG capsule Take 1 capsule (100 mg total) by mouth 2  (two) times daily as needed for mild constipation. Patient taking differently: Take 100 mg by mouth at bedtime.  07/28/19  Yes Ivor Costa, MD  ELIQUIS 2.5 MG TABS tablet TAKE 1 TABLET BY MOUTH TWICE A DAY Patient taking differently: Take 2.5 mg by mouth 2 (two) times daily.  08/27/20  Yes Burchette, Alinda Sierras, MD  furosemide (LASIX) 40 MG tablet Take 1 tablet (40 mg total) by mouth 2 (two) times daily. 07/30/20  Yes Burchette, Alinda Sierras, MD  hydrocortisone 2.5 % cream Apply 1 application topically daily as needed (facial breakouts).   Yes [provider]  Multiple Vitamins-Minerals (ICAPS AREDS 2 PO) Take 1 capsule by mouth daily.   Yes [provider]  nitroGLYCERIN (NITROSTAT) 0.4 MG SL tablet Place 1 tablet (0.4 mg total) under the tongue every 5 (five) minutes x 3 doses as needed for chest pain (if no relief after 3rd dose, proceed to the ED  for an evaluation). 04/17/18  Yes [name], MD  pantoprazole (PROTONIX) 40 MG tablet TAKE 1 TABLET BY MOUTH EVERY DAY Patient taking differently: Take 40 mg by mouth daily.  07/22/20  Yes Burchette, Alinda Sierras, MD  Polyethyl Glycol-Propyl Glycol (LUBRICANT EYE DROPS) 0.4-0.3 % SOLN Place 1-2 drops into both eyes 3 (three) times daily as needed (dry/irritated eyes.).   Yes [provider]  sitaGLIPtin (JANUVIA) 50 MG tablet Take 1 tablet (50 mg total) by mouth daily. 03/10/20  Yes Burchette, Alinda Sierras, MD  spironolactone (ALDACTONE) 25 MG tablet TAKE 1 TABLET BY MOUTH EVERY DAY Patient taking differently: Take 25 mg by mouth daily.  05/12/20  Yes [name], MD  albuterol (PROVENTIL) (2.5 MG/3ML) 0.083% nebulizer solution Take 3 mLs (2.5 mg total) by nebulization every 4 (four) hours as needed for wheezing or shortness of breath. Patient not taking: Reported on 09/07/2020 07/28/19   Ivor Costa, MD  amLODipine (NORVASC) 5 MG tablet Take 1 tablet (5 mg total) by mouth daily. Patient not taking: Reported on 09/07/2020 07/30/20    [name], MD  Continuous Blood Gluc Receiver (FREESTYLE LIBRE 14 DAY READER) DEVI 1 Device by Does not apply route every 14 (fourteen) days. 04/28/20   Burchette, Alinda Sierras, MD  Continuous Blood Gluc Sensor (FREESTYLE LIBRE 14 DAY SENSOR) MISC 1 Device by Does not apply route every 14 (fourteen) days. 04/28/20   Burchette, Alinda Sierras, MD  glucose blood (FREESTYLE LITE) test strip 1 each by Other route as needed. Use as instructed     [provider]  Lancets (FREESTYLE) lancets 1 each by Other route as needed. Use as instructed     [provider]  levalbuterol (XOPENEX) 0.63 MG/3ML nebulizer solution Take 3 mLs (0.63 mg total) by nebulization every 6 (six) hours as needed for shortness of breath. Patient not taking: Reported on 09/07/2020 01/08/20   Spero Geralds, MD  rosuvastatin (CRESTOR) 20 MG tablet TAKE 1 TABLET BY MOUTH EVERY DAY 09/08/20   Burchette, Alinda Sierras, MD    Inpatient Medications: Scheduled Meds: . amLODipine  5 mg Oral Daily  . apixaban  2.5 mg Oral BID  . carvedilol  6.25 mg Oral BID WC  . cefdinir  300 mg Oral Daily  . clopidogrel  75 mg Oral Daily  . furosemide  40 mg Intravenous BID  . insulin aspart  0-15 Units Subcutaneous TID WC  . ipratropium-albuterol  3 mL Nebulization Once  . mometasone-formoterol  2 puff Inhalation BID  . pantoprazole  40 mg Oral Daily  . [START ON 09/09/2020] predniSONE  20 mg Oral Q breakfast  . rosuvastatin  20 mg Oral Daily  . sodium chloride flush  3 mL Intravenous Q12H  . spironolactone  25 mg Oral Daily   Continuous Infusions:  PRN Meds: acetaminophen **OR** acetaminophen, albuterol, docusate sodium, melatonin, nitroGLYCERIN, ondansetron (ZOFRAN) IV, polyvinyl alcohol  Allergies:    Allergies  Allergen Reactions  . Codeine Other (See Comments)    Makes him feel goofy  . Morphine Other (See Comments)    Nightmares and felt bad    Social History:   Social History   Socioeconomic History  . Marital  status: Married    Spouse name: Not on file  . Number of children: 2  . Years of education: Not on file  . Highest education level: Not on file  Occupational History    Employer: RETIRED    Comment: Retired  Tobacco Use  . Smoking  status: Former Smoker    Packs/day: 0.50    Years: 5.00    Pack years: 2.50    Types: Cigarettes    Quit date: 07/20/1972    Years since quitting: 48.1  . Smokeless tobacco: Never Used  . Tobacco comment: quit s  Vaping Use  . Vaping Use: Never used  Substance and Sexual Activity  . Alcohol use: No  . Drug use: No    Comment: quit smoking 40 years ago  . Sexual activity: Never  Other Topics Concern  . Not on file  Social History Narrative   Retired   Married      Social Determinants of Radio broadcast assistant Strain:   . Difficulty of Paying Living Expenses: Not on file  Food Insecurity:   . Worried About Charity fundraiser in the Last Year: Not on file  . Ran Out of Food in the Last Year: Not on file  Transportation Needs:   . Lack of Transportation (Medical): Not on file  . Lack of Transportation (Non-Medical): Not on file  Physical Activity:   . Days of Exercise per Week: Not on file  . Minutes of Exercise per Session: Not on file  Stress:   . Feeling of Stress : Not on file  Social Connections:   . Frequency of Communication with Friends and Family: Not on file  . Frequency of Social Gatherings with Friends and Family: Not on file  . Attends Religious Services: Not on file  . Active Member of Clubs or Organizations: Not on file  . Attends Archivist Meetings: Not on file  . Marital Status: Not on file  Intimate Partner Violence:   . Fear of Current or Ex-Partner: Not on file  . Emotionally Abused: Not on file  . Physically Abused: Not on file  . Sexually Abused: Not on file    Family History:    Family History  Problem Relation Age of Onset  . Cancer Mother        stomach     ROS:  Please see the history  of present illness.   All other ROS reviewed and negative.     Physical Exam/Data:   Vitals:   09/07/20 2327 09/08/20 0400 09/08/20 0720 09/08/20 1112  BP: 103/61 (!) 120/54 (!) 120/51 (!) 115/54  Pulse: 94 62 70 (!) 53  Resp: 18 13 19 16   Temp: 99.2 F (37.3 C) (!) 97.5 F (36.4 C) (!) 97.4 F (36.3 C) (!) 97.5 F (36.4 C)  TempSrc: Oral Oral Oral Oral  SpO2: 91% 95% 92% 94%  Weight:  79.6 kg    Height:        Intake/Output Summary (Last 24 hours) at 09/08/2020 1222 Last data filed at 09/08/2020 0749 Gross per 24 hour  Intake 440 ml  Output 730 ml  Net -290 ml   Last 3 Weights 09/08/2020 09/07/2020 08/07/2020  Weight (lbs) 175 lb 7.8 oz 175 lb 183 lb  Weight (kg) 79.6 kg 79.379 kg 83.008 kg     Body mass index is 25.91 kg/m.  General:  Well nourished, well developed, in no acute distress HEENT: normal Lymph: no adenopathy Neck: no JVD Endocrine:  No thryomegaly Vascular: No carotid bruits; FA pulses 2+ bilaterally without bruits  Cardiac:  Irregular rhythm, regular rate, no murmur Lungs:  Crackles in bases Abd: soft, nontender, no hepatomegaly  Ext: no edema Musculoskeletal:  No deformities, BUE and BLE strength normal and equal Skin: warm and  dry  Neuro:  CNs 2-12 intact, no focal abnormalities noted Psych:  Normal affect   EKG:  The EKG was personally reviewed and demonstrates:  Afib with ventricular rate 59, LBBB Telemetry:  Telemetry was personally reviewed and demonstrates:  Afib, controlled VR, frequent PVCs  Relevant CV Studies:  Echo 09/08/20: 1. Left ventricular ejection fraction, by estimation, is 50 to 55%. The  left ventricle has low normal function. The left ventricle demonstrates  regional wall motion abnormalities (see scoring diagram/findings for  description). There is severe left  ventricular hypertrophy. Left ventricular diastolic function could not be  evaluated. There is severe hypokinesis of the left ventricular, basal-mid  inferior  wall.  2. Right ventricular systolic function is moderately reduced. The right  ventricular size is normal.  3. Right atrial size was mildly dilated.  4. The mitral valve is abnormal. Trivial mitral valve regurgitation.  5. The aortic valve is calcified. Aortic valve regurgitation is not  visualized. Moderate to severe aortic valve stenosis. Aortic valve area,  by VTI measures 0.87 cm. Aortic valve mean gradient measures 22.0 mmHg.  Aortic valve Vmax measures 2.99 m/s. DI  of 0.25.  6. Aortic dilatation noted. There is mild dilatation of the ascending  aorta, measuring 40 mm.  7. The inferior vena cava is dilated in size with <50% respiratory  variability, suggesting right atrial pressure of 15 mmHg.    Echo 07/2019: 1. Left ventricular ejection fraction, by visual estimation, is 60 to  65%. The left ventricle has normal function. Normal left ventricular size.  There is severely increased left ventricular hypertrophy.  2. Elevated mean left atrial pressure.  3. Left ventricular diastolic Doppler parameters are consistent with  pseudonormalization pattern of LV diastolic filling.  4. Global right ventricle has normal systolic function.The right  ventricular size is normal. No increase in right ventricular wall  thickness.  5. Left atrial size was mildly dilated.  6. Right atrial size was normal.  7. Mild mitral annular calcification.  8. The mitral valve is normal in structure. Mild mitral valve  regurgitation. No evidence of mitral stenosis.  9. The tricuspid valve is normal in structure. Tricuspid valve  regurgitation was not visualized by color flow Doppler.  10. Aortic valve mean gradient measures 16.0 mmHg.  11. Aortic valve peak gradient measures 25.5 mmHg.  12. The aortic valve is tricuspid Aortic valve regurgitation was not  visualized by color flow Doppler. Mild aortic valve stenosis.  13. There is Moderate calcification of the aortic valve.  14. There is  Moderate thickening of the aortic valve.  15. The pulmonic valve was normal in structure. Pulmonic valve  regurgitation is trivial by color flow Doppler.  16. Normal pulmonary artery systolic pressure.  17. The inferior vena cava is normal in size with greater than 50%  respiratory variability, suggesting right atrial pressure of 3 mmHg.   Laboratory Data:  High Sensitivity Troponin:   Recent Labs  Lab 09/07/20 1422 09/07/20 1619 09/07/20 2212  TROPONINIHS 79* 89* 99*     Chemistry Recent Labs  Lab 09/07/20 1422 09/08/20 0126  NA 128* 132*  K 3.8 4.0  CL 95* 98  CO2 21* 21*  GLUCOSE 265* 261*  BUN 29* 32*  CREATININE 2.01* 2.20*  CALCIUM 9.0 9.3  GFRNONAA 32* 29*  ANIONGAP 12 13    Recent Labs  Lab 09/07/20 1619 09/08/20 0126  PROT 7.0 6.9  ALBUMIN 3.3* 3.1*  AST 16 14*  ALT 22 19  ALKPHOS 75  64  BILITOT 2.1* 1.8*   Hematology Recent Labs  Lab 09/07/20 1422 09/08/20 0126  WBC 11.5* 11.1*  RBC 4.34 4.29  HGB 13.5 12.9*  HCT 39.2 37.8*  MCV 90.3 88.1  MCH 31.1 30.1  MCHC 34.4 34.1  RDW 12.4 12.4  PLT 120* 114*   BNP Recent Labs  Lab 09/07/20 1619  BNP 426.6*    DDimer No results for input(s): DDIMER in the last 168 hours.   Radiology/Studies:  DG Chest Portable 1 View  Result Date: 09/07/2020 CLINICAL DATA:  Shortness of breath EXAM: PORTABLE CHEST 1 VIEW COMPARISON:  April 02, 2020 FINDINGS: No pneumothorax. Cardiomegaly. The hila and mediastinum are unremarkable. No pulmonary nodules, masses, focal infiltrates, or overt edema. IMPRESSION: No active disease. Electronically Signed   By: Dorise Bullion III M.D   On: 09/07/2020 16:44   ECHOCARDIOGRAM COMPLETE  Result Date: 09/08/2020    ECHOCARDIOGRAM REPORT   Patient Name:   [name] Date of Exam: 09/08/2020 Medical Rec #:  [id number]    Height:       69.0 in Accession #:    [id number]   Weight:       175.5 lb Date of Birth:  November 26, 1932    BSA:          1.954 m Patient Age:    63 years      BP:           120/51 mmHg Patient Gender: M            HR:           51 bpm. Exam Location:  Inpatient Procedure: 2D Echo, Cardiac Doppler and Color Doppler Indications:    R06.02 SOB  History:        Patient has prior history of Echocardiogram examinations, most                 recent 07/27/2019. Cardiomyopathy and CHF, CAD, Abnormal ECG,                 Aortic Valve Disease, Arrythmias:Atrial Fibrillation and LBBB,                 Signs/Symptoms:Chest Pain; Risk Factors:Hypertension, Diabetes                 and Dyslipidemia. Cancer. Aortic stenosis.  Sonographer:    Roseanna Rainbow RDCS Referring Phys: [id number] [place]  Sonographer Comments: Technically difficult study due to poor echo windows, no subcostal window and suboptimal apical window. IMPRESSIONS  1. Left ventricular ejection fraction, by estimation, is 50 to 55%. The left ventricle has low normal function. The left ventricle demonstrates regional wall motion abnormalities (see scoring diagram/findings for description). There is severe left ventricular hypertrophy. Left ventricular diastolic function could not be evaluated. There is severe hypokinesis of the left ventricular, basal-mid inferior wall.  2. Right ventricular systolic function is moderately reduced. The right ventricular size is normal.  3. Right atrial size was mildly dilated.  4. The mitral valve is abnormal. Trivial mitral valve regurgitation.  5. The aortic valve is calcified. Aortic valve regurgitation is not visualized. Moderate to severe aortic valve stenosis. Aortic valve area, by VTI measures 0.87 cm. Aortic valve mean gradient measures 22.0 mmHg. Aortic valve Vmax measures 2.99 m/s. DI  of 0.25.  6. Aortic dilatation noted. There is mild dilatation of the ascending aorta, measuring 40 mm.  7. The inferior vena cava is dilated in size with <50% respiratory variability, suggesting  right atrial pressure of 15 mmHg. Comparison(s): Changes from prior study are noted. 07/27/2019: LVEF  60-65%, severe LVH, mild AS - peak and mean gradients of 15/26 mmHg. FINDINGS  Left Ventricle: Left ventricular ejection fraction, by estimation, is 50 to 55%. The left ventricle has low normal function. The left ventricle demonstrates regional wall motion abnormalities. Severe hypokinesis of the left ventricular, basal-mid inferior wall. The left ventricular internal cavity size was normal in size. There is severe left ventricular hypertrophy. Abnormal (paradoxical) septal motion, consistent with left bundle branch block. Left ventricular diastolic function could not be evaluated due to atrial fibrillation. Left ventricular diastolic function could not be evaluated. Right Ventricle: The right ventricular size is normal. No increase in right ventricular wall thickness. Right ventricular systolic function is moderately reduced. Left Atrium: Left atrial size was normal in size. Right Atrium: Right atrial size was mildly dilated. Pericardium: There is no evidence of pericardial effusion. Mitral Valve: The mitral valve is abnormal. Mild to moderate mitral annular calcification. Trivial mitral valve regurgitation. Tricuspid Valve: The tricuspid valve is grossly normal. Tricuspid valve regurgitation is not demonstrated. Aortic Valve: The aortic valve is calcified. Aortic valve regurgitation is not visualized. Moderate to severe aortic stenosis is present. Aortic valve mean gradient measures 22.0 mmHg. Aortic valve peak gradient measures 35.8 mmHg. Aortic valve area, by VTI measures 0.87 cm. Pulmonic Valve: The pulmonic valve was grossly normal. Pulmonic valve regurgitation is trivial. Aorta: Aortic dilatation noted. There is mild dilatation of the ascending aorta, measuring 40 mm. Venous: The inferior vena cava is dilated in size with less than 50% respiratory variability, suggesting right atrial pressure of 15 mmHg. IAS/Shunts: No atrial level shunt detected by color flow Doppler.  LEFT VENTRICLE PLAX 2D LVIDd:          3.80 cm LVIDs:         3.15 cm LV PW:         1.90 cm LV IVS:        2.00 cm LVOT diam:     2.10 cm LV SV:         57 LV SV Index:   29 LVOT Area:     3.46 cm  LV Volumes (MOD) LV vol d, MOD A2C: 115.0 ml LV vol d, MOD A4C: 117.0 ml LV vol s, MOD A2C: 47.4 ml LV vol s, MOD A4C: 51.6 ml LV SV MOD A2C:     67.6 ml LV SV MOD A4C:     117.0 ml LV SV MOD BP:      64.7 ml RIGHT VENTRICLE            IVC RV S prime:     5.12 cm/s  IVC diam: 2.40 cm TAPSE (M-mode): 0.7 cm LEFT ATRIUM             Index       RIGHT ATRIUM           Index LA diam:        5.00 cm 2.56 cm/m  RA Area:     21.30 cm LA Vol (A2C):   57.7 ml 29.52 ml/m RA Volume:   67.00 ml  34.28 ml/m LA Vol (A4C):   62.3 ml 31.88 ml/m LA Biplane Vol: 62.0 ml 31.72 ml/m  AORTIC VALVE AV Area (Vmax):    0.86 cm AV Area (Vmean):   0.80 cm AV Area (VTI):     0.87 cm AV Vmax:  299.00 cm/s AV Vmean:          226.000 cm/s AV VTI:            0.659 m AV Peak Grad:      35.8 mmHg AV Mean Grad:      22.0 mmHg LVOT Vmax:         74.20 cm/s LVOT Vmean:        52.300 cm/s LVOT VTI:          0.166 m LVOT/AV VTI ratio: 0.25  AORTA Ao Root diam: 3.30 cm Ao Asc diam:  4.00 cm MITRAL VALVE MV Area (PHT): 4.52 cm     SHUNTS MV Decel Time: 168 msec     Systemic VTI:  0.17 m MV E velocity: 117.00 cm/s  Systemic Diam: 2.10 cm Lyman Bishop MD Electronically signed by Lyman Bishop MD Signature Date/Time: 09/08/2020/10:59:23 AM    Final      Assessment and Plan:   CAD s/p CABG x 4 with subsequent PCI to SVG-OM (2016) Chest pain - hs troponin 79 --> 89 --> 99 - EKG with Afib and LBBB - pt does report 2 months of daily exertional chest pain relieved with rest and nitro SL - last stress test in 2018 with scar, no reversible ischemia - symptoms suspicious for angina - since CE low and flat and CKD, would recommend repeat nuclear stress test, but could be completed outpatient after he has recovered from his illness - continue crestor, plavix, and BB   Acute  on chronic systolic and diastolic heart failure Chronic dyspnea - pt has received 40 mg IV lasix x 2 doses with pt reported increased urination - weight is unchanged, I&Os may not be complete - continues to have crackles in bases - would give additional dose of 40 mg IV lasix today - pt states breathing is now baseline - takes 40 mg lasix BID at baseline, 25 mg spironolactone   Aortic stenosis - has progressed to moderate to severe on echo today - could be contributing to his shortness of breath - will follow closely and can refer to structural heart team, although renal function could complicate management   Hypertension - continue amlodipine, BB, spiro   Persistent atrial fibrillation LBBB - rate controlled - continue low dose eliquis - no syncope or falls   DM2 - A1c 7.3% - per primary - could consider SGLT2i given his cardiac history   Acute on chronic renal disease stage III - sCr 2.20 today - baseline appears to be 1.9 - will need close follow up of renal function   Suspect he was mildly volume up with a concomitant acute febrile illness. Pt feels much better now that he is afebrile and after IV diuresis. As above, would give one more dose of IV diuretic.   In addition, he is also describing symptoms concerning for stable angina. Given his history, renal function, and troponin elevation that is mild and flat, would opt to repeat a nuclear stress test.  Will need to decide if this should be completed inpatient.      HEAR Score (for undifferentiated chest pain):  HEAR Score: 7       CHA2DS2-VASc Score = 5  This indicates a 7.2% annual risk of stroke. The patient's score is based upon: CHF History: 1 HTN History: 1 Diabetes History: 1 Stroke History: 0 Vascular Disease History: 0 Age Score: 2 Gender Score: 0    For questions or updates, please contact [place] Please  consult [email or web address] for contact info under    Signed, Ledora Bottcher,  PA  09/08/2020 12:22 PM   History and all data above reviewed.  Patient examined.  I agree with the findings as above.   The patient has had a fever off an on for 3 - 4 days without clear source.  With this (or possibly preceding this) he has had increased SOB.  He also has had to take NTG one pill daily for the last 3 days because of increased chest pain.  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  However, over the past couple of days he has had increased DOE.   He is not describing PND or orthopnea.  He has had chronic dyspnea with unrevealing work up over the past couple of years including evaluation by pulmonary and a non diagnostic CPX last year.  Since admission he feels "100% better" after steroids.  He also has had IV diuresis since admission.  BNP was mildly elevated.  Trop is not diagnostic with a flat trend.  EKG without acute changes on a baseline abnormal study as above.  The objective finding is an echo which suggests that his AS is worse than previous.   The patient exam reveals [id number], systolic murmur with radiation out the aortic outflow tract.    ,  Lungs: Clear  ,  Abd: Positive bowel sounds, no rebound no guarding, Ext No edema  .  All available labs, radiology testing, previous records reviewed. Agree with documented assessment and plan.   Chest pain:  Plan out patient Lexiscan Myoview.  Aortic Stenosis:  I will follow this.  Cath would be high risk to evaluate right and left pressures.  I will likely repeat an echo in six months.  In the meantime, he might have had mild over diuresis with two doses of Lasix IV and so I would send him home with Lasix 40 mg daily only.  I would suggest this for 2 days then resume BID and we will repeat a BMET in the office on Friday.  OK to continue spironolactone.  I discussed again with his wife and him low salt.  He eats sandwiches with pimento cheese so continued education is indicated.    Jeneen Rinks Derick Seminara  1:58 PM   09/08/2020

## 2020-09-08 NOTE — Progress Notes (Signed)
  Echocardiogram 2D Echocardiogram has been performed.  [name] 09/08/2020, 9:52 AM

## 2020-09-08 NOTE — Progress Notes (Signed)
Inpatient Diabetes Program Recommendations  AACE/ADA: New Consensus Statement on Inpatient Glycemic Control (2015)  Target Ranges:  Prepandial:   less than 140 mg/dL      Peak postprandial:   less than 180 mg/dL (1-2 hours)      Critically ill patients:  140 - 180 mg/dL   Lab Results  Component Value Date   GLUCAP 371 (H) 09/08/2020   HGBA1C 7.3 (H) 09/07/2020    Review of Glycemic Control Results for [name], [name] (MRN [id number]) as of 09/08/2020 13:04  Ref. Range 09/07/2020 21:29 09/08/2020 06:38 09/08/2020 11:14  Glucose-Capillary Latest Ref Range: 70 - 99 mg/dL 231 (H) 314 (H) 371 (H)   Diabetes history: DM2 Outpatient Diabetes medications: Januvia 50 mg qd Current orders for Inpatient glycemic control: Novolog 0-15 correction tid with meals  Inpatient Diabetes Program Recommendations:   While on steroids: -Add Novolog 5 units tid meal coverage if eats 50% Secure chat sent to Dr. Earnest Conroy.  Thank you, [name]. Shawn Weathington, RN, MSN, CDE  Diabetes Coordinator Inpatient Glycemic Control Team Team Pager [phone] (8am-5pm) 09/08/2020 1:07 PM

## 2020-09-08 NOTE — Care Management CC44 (Signed)
Condition Code 44 Documentation Completed  Patient Details  Name: [name] MRN: [id number] Date of Birth: [birth year]   Condition Code 44 given:  Yes Patient signature on Condition Code 44 notice:  Yes Documentation of 2 MD's agreement:  Yes Code 44 added to claim:  Yes    Zenon Mayo, RN 09/08/2020, 3:22 PM

## 2020-09-08 NOTE — TOC Transition Note (Signed)
Transition of Care [facility]) - CM/SW Discharge Note   Patient Details  Name: [name] MRN: [id number] Date of Birth: 05-Aug-1933  [facility] CM/SW Contact:  Zenon Mayo, RN Phone Number: 09/08/2020, 3:27 PM   Clinical Narrative:    Patient is for dc today, NCM asked if he would like HHRN, he states no.  He has transportation home.    Final next level of care: Home/Self Care Barriers to Discharge: No Barriers Identified   Patient Goals and CMS Choice        Discharge Placement                       Discharge Plan and Services                  DME Agency: NA                  Social Determinants of Health (SDOH) Interventions     Readmission Risk Interventions No flowsheet data found.

## 2020-09-08 NOTE — Discharge Summary (Addendum)
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 male with medical history significant of diastolic heart failure, CKD 3, carotid artery stenosis, CAD status post stent x5 and CABG x4, hyperlipidemia, GERD, hypertension, paroxysmal A. fib on Eliquis, diabetes, COPD who presents with increased dyspnea on exertion and chest pain on exertion.  Patient states he has had some intermittent shortness of breath including exertion for about a year he has been started on inhalers for COPD.  He is also had some chest pain on exertion for several months at least.  Both of these have worsened for the past couple of days and he has noticed it is improved with nitro and worse with exertion.  He does report a fever each of the last 3 days though none has been seen thus far here in ED.  He denies significant weight gain, change in his intake, significant orthopnea.  Symptoms are somewhat similar to previous heart failure exacerbation last October.  He reports some palpitations when he knows he had a fever.  He denies cough, abdominal pain, constipation, diarrhea. ED Course: Vital signs in the ED significant for mild tachypnea in the low 20s only. . Labs showed  sodium of 128 which corrects to around 130 given glucose of 266, creatinine of 2 which is his baseline.  LFTs showed 3.32blef2.1. CBC showed mild leukocytosis to 11.5 and PLT of 120.  Troponins 79 and 89.  BNP 426.  Respiratory panel for flu and Covid negative.  Urine analysis, urine culture, blood cultures ordered but not yet obtained.  Chest x-ray was read as no acute disease, though there may be some mild interstitial edema.  ED provider reported hearing wheezing on exam and had started nebulizer and dose of IV steroids (solumedrol 125 mg) .  He is also received a dose of IV Lasix.   Hospital course:Tmax 100.55F (isolated episode) overnight.  During rounds, patient lying supine with no discomfort and reports feeling much better.  He is saturating well on room air.  Wife at bedside.  Patient feels he is significantly improved after receiving IV Solu-Medrol in the ED and asking if he can get prescription for the same upon discharge.  Patient started on prednisone 20 mg daily with intent for a short course but his blood glucose jumped to 300s.  He likely has a component of acute bronchitis/COPD exacerbation.  Will prescribe antibiotics given low-grade fever, resume Symbicort and beta agonist inhalers/nebs as needed. Patient seen by cardiology who felt he might have had mild CHF component given elevated BNP, chest x-ray findings.  Recommended additional dose of IV Lasix today by Cardiology NP. Dr Percival Spanish MD later evaluated patient and felt that he might have been overdiuresed, recommended taking lasix only once daily for next  2 days then resuming BID dosing (patient aware).  Patient also on Aldactone and Coreg which have been resumed.  May resume Plavix/Eliquis as well.  Patient's baseline creatinine around 2 and appears to be stable.  Not on ACE inhibitors due to?  Renal dysfunction.  Did not find any specific allergy listed.  Repeat echo showed progression of aortic stenosis from moderate to severe which might  be contributing to CHF exacerbation per cardiology.  They have recommended outpatient follow-up closely and can refer to structural heart team, although renal function could complicate management- cleared for discharge with no additional inpatient work-up recommended. Walking desaturations obtained prior to discharge and was able to maintain sats greater than 92%.  Suspect hyperglycemia secondary to steroids.  Hemoglobin A1c 7.3.  Resume home medications and can consider SGLT2 inhibitors given cardiac history-defer to PCP.  Discharge Exam:   Vitals:   09/07/20 2327 09/08/20 0400 09/08/20 0720 09/08/20 1112  BP: 103/61 (!) 120/54 (!) 120/51 (!) 115/54  Pulse: 94 62 70 (!) 53  Resp: 18 13 19 16   Temp: 99.2 F (37.3 C) (!) 97.5 F (36.4 C) (!) 97.4 F (36.3 C) (!) 97.5 F (36.4 C)  TempSrc: Oral Oral Oral Oral  SpO2: 91% 95% 92% 94%  Weight:  79.6 kg    Height:        General: Pt is alert, awake, not in acute distress Cardiovascular: RRR, S1/S2 +, no rubs, no gallops Respiratory: CTA bilaterally, no wheezing, no rhonchi Abdominal: Soft, NT, ND, bowel sounds + Extremities: no edema, no cyanosis  Discharge Condition:Stable CODE STATUS: Full code Diet recommendation: Carb controlled, cardiac diet Recommendations for Outpatient Follow-up:  1. Follow up with PCP: 3 to 5 days 2. Follow up with consultants: Cardiology clinic in 1 week 3. Please obtain follow up labs including: BMP     Discharge Instructions:  Discharge Instructions    (HEART FAILURE PATIENTS) Call MD:  Anytime you have any of the following symptoms: 1) 3 pound weight gain in 24 hours or 5 pounds in 1 week 2) shortness of breath, with or without a dry hacking cough 3) swelling in the hands, feet or stomach 4) if you have to sleep on extra pillows at night in order to breathe.   Complete by: As directed    Avoid straining   Complete by: As directed    Call MD for:  difficulty breathing, headache or visual  disturbances   Complete by: As directed    Call MD for:  extreme fatigue   Complete by: As directed    Call MD for:  persistant dizziness or light-headedness   Complete by: As directed    Call MD for:  severe uncontrolled pain   Complete by: As directed    Call MD for:  temperature >100.4   Complete by: As directed    Diet - low sodium heart healthy   Complete by: As directed    Diet Carb Modified   Complete by: As directed    Discharge instructions   Complete by: As directed    Follow up PCP in 3 days and cardiology clinic as scheduled   Heart Failure patients record your daily weight using the same scale at the same time of day   Complete by: As directed    Increase activity slowly   Complete by: As directed    STOP any activity that causes chest pain, shortness of breath, dizziness, sweating, or exessive weakness   Complete by: As directed  Allergies as of 09/08/2020      Reactions   Codeine Other (See Comments)   Makes him feel goofy   Morphine Other (See Comments)   Nightmares and felt bad      Medication List    STOP taking these medications   levalbuterol 0.63 MG/3ML nebulizer solution Commonly known as: XOPENEX     TAKE these medications   acetaminophen 500 MG tablet Commonly known as: TYLENOL Take 1,000 mg by mouth every 4 (four) hours as needed for fever or headache (pain).   albuterol (2.5 MG/3ML) 0.083% nebulizer solution Commonly known as: PROVENTIL Take 3 mLs (2.5 mg total) by nebulization every 4 (four) hours as needed for wheezing or shortness of breath. What changed: Another medication with the same name was changed. Make sure you understand how and when to take each.   albuterol 108 (90 Base) MCG/ACT inhaler Commonly known as: VENTOLIN HFA Inhale 2 puffs into the lungs every 6 (six) hours as needed. What changed: reasons to take this   amLODipine 5 MG tablet Commonly known as: NORVASC Take 1 tablet (5 mg total) by mouth daily. What  changed: Another medication with the same name was removed. Continue taking this medication, and follow the directions you see here.   budesonide-formoterol 160-4.5 MCG/ACT inhaler Commonly known as: Symbicort Inhale 2 puffs into the lungs in the morning and at bedtime.   carvedilol 6.25 MG tablet Commonly known as: COREG TAKE 1 TABLET (6.25 MG TOTAL) BY MOUTH 2 (TWO) TIMES DAILY WITH A MEAL.   cefdinir 300 MG capsule Commonly known as: OMNICEF Take 1 capsule (300 mg total) by mouth daily. Start taking on: September 09, 2020   clopidogrel 75 MG tablet Commonly known as: PLAVIX TAKE 1 TABLET (75 MG TOTAL) BY MOUTH DAILY. What changed: See the new instructions.   docusate sodium 100 MG capsule Commonly known as: Stool Softener Take 1 capsule (100 mg total) by mouth 2 (two) times daily as needed for mild constipation. What changed: when to take this   Eliquis 2.5 MG Tabs tablet Generic drug: apixaban TAKE 1 TABLET BY MOUTH TWICE A DAY What changed: how much to take   freestyle lancets 1 each by Other route as needed. Use as instructed   FreeStyle Libre 14 Day Reader Kerrin Mo 1 Device by Does not apply route every 14 (fourteen) days.   FreeStyle Libre 14 Day Sensor Misc 1 Device by Does not apply route every 14 (fourteen) days.   FREESTYLE LITE test strip Generic drug: glucose blood 1 each by Other route as needed. Use as instructed   furosemide 40 MG tablet Commonly known as: LASIX Take 1 tablet (40 mg total) by mouth 2 (two) times daily to be resumed on Thursday, once daily on Tuesday and wednesday.   guaiFENesin-dextromethorphan 100-10 MG/5ML syrup Commonly known as: ROBITUSSIN DM Take 5 mLs by mouth every 6 (six) hours as needed for cough.   hydrocortisone 2.5 % cream Apply 1 application topically daily as needed (facial breakouts).   ICAPS AREDS 2 PO Take 1 capsule by mouth daily.   Lubricant Eye Drops 0.4-0.3 % Soln Generic drug: Polyethyl Glycol-Propyl  Glycol Place 1-2 drops into both eyes 3 (three) times daily as needed (dry/irritated eyes.).   nitroGLYCERIN 0.4 MG SL tablet Commonly known as: NITROSTAT Place 1 tablet (0.4 mg total) under the tongue every 5 (five) minutes x 3 doses as needed for chest pain (if no relief after 3rd dose, proceed to the ED for an  evaluation).   pantoprazole 40 MG tablet Commonly known as: PROTONIX TAKE 1 TABLET BY MOUTH EVERY DAY   rosuvastatin 20 MG tablet Commonly known as: CRESTOR TAKE 1 TABLET BY MOUTH EVERY DAY   sitaGLIPtin 50 MG tablet Commonly known as: Januvia Take 1 tablet (50 mg total) by mouth daily.   spironolactone 25 MG tablet Commonly known as: ALDACTONE TAKE 1 TABLET BY MOUTH EVERY DAY       Follow-up Information    Almyra Deforest, PA Follow up on 09/25/2020.   Specialties: Cardiology, Radiology Why: 2:15 pm for hospital follow up, arrange nuclear stress test Contact information: [place] [address] [phone]              Allergies  Allergen Reactions  . Codeine Other (See Comments)    Makes him feel goofy  . Morphine Other (See Comments)    Nightmares and felt bad      The results of significant diagnostics from this hospitalization (including imaging, microbiology, ancillary and laboratory) are listed below for reference.    Labs: BNP (last 3 results) Recent Labs    12/27/19 1120 09/07/20 1619  BNP 234.3* 893.7*   Basic Metabolic Panel: Recent Labs  Lab 09/07/20 1422 09/08/20 0126  NA 128* 132*  K 3.8 4.0  CL 95* 98  CO2 21* 21*  GLUCOSE 265* 261*  BUN 29* 32*  CREATININE 2.01* 2.20*  CALCIUM 9.0 9.3  MG  --  2.0   Liver Function Tests: Recent Labs  Lab 09/07/20 1619 09/08/20 0126  AST 16 14*  ALT 22 19  ALKPHOS 75 67  BILITOT 2.1* 1.8*  PROT 7.0 6.9  ALBUMIN 3.3* 3.1*   No results for input(s): LIPASE, AMYLASE in the last 168 hours. No results for input(s): AMMONIA in the last 168  hours. CBC: Recent Labs  Lab 09/07/20 1422 09/08/20 0126  WBC 11.5* 11.1*  HGB 13.5 12.9*  HCT 39.2 37.8*  MCV 90.3 88.1  PLT 120* 114*   Cardiac Enzymes: No results for input(s): CKTOTAL, CKMB, CKMBINDEX, TROPONINI in the last 168 hours. BNP: Invalid input(s): POCBNP CBG: Recent Labs  Lab 09/07/20 2129 09/08/20 0638 09/08/20 1114  GLUCAP 231* 314* 371*   D-Dimer No results for input(s): DDIMER in the last 72 hours. Hgb A1c Recent Labs    09/07/20 2212  HGBA1C 7.3*   Lipid Profile No results for input(s): CHOL, HDL, LDLCALC, TRIG, CHOLHDL, LDLDIRECT in the last 72 hours. Thyroid function studies No results for input(s): TSH, T4TOTAL, T3FREE, THYROIDAB in the last 72 hours.  Invalid input(s): FREET3 Anemia work up No results for input(s): VITAMINB12, FOLATE, FERRITIN, TIBC, IRON, RETICCTPCT in the last 72 hours. Urinalysis    Component Value Date/Time   COLORURINE YELLOW 07/26/2019 1111   APPEARANCEUR CLEAR 07/26/2019 1111   LABSPEC 1.009 07/26/2019 1111   PHURINE 6.0 07/26/2019 1111   GLUCOSEU NEGATIVE 07/26/2019 1111   HGBUR NEGATIVE 07/26/2019 1111   HGBUR trace-intact 11/23/2007 0000   BILIRUBINUR NEGATIVE 07/26/2019 1111   KETONESUR NEGATIVE 07/26/2019 1111   PROTEINUR 30 (A) 07/26/2019 1111   UROBILINOGEN 0.2 02/16/2011 2229   NITRITE NEGATIVE 07/26/2019 1111   LEUKOCYTESUR NEGATIVE 07/26/2019 1111   Sepsis Labs Invalid input(s): PROCALCITONIN,  WBC,  LACTICIDVEN Microbiology Recent Results (from the past 240 hour(s))  Respiratory Panel by RT PCR (Flu A&B, Covid) - Nasopharyngeal Swab     Status: None   Collection Time: 09/07/20  4:19 PM   Specimen: Nasopharyngeal Swab;  Nasopharyngeal(NP) swabs in vial transport medium  Result Value Ref Range Status   SARS Coronavirus 2 by RT PCR NEGATIVE NEGATIVE Final    Comment: (NOTE) SARS-CoV-2 target nucleic acids are NOT DETECTED.  The SARS-CoV-2 RNA is generally detectable in upper  respiratoy specimens during the acute phase of infection. The lowest concentration of SARS-CoV-2 viral copies this assay can detect is 131 copies/mL. A negative result does not preclude SARS-Cov-2 infection and should not be used as the sole basis for treatment or other patient management decisions. A negative result may occur with  improper specimen collection/handling, submission of specimen other than nasopharyngeal swab, presence of viral mutation(s) within the areas targeted by this assay, and inadequate number of viral copies (<131 copies/mL). A negative result must be combined with clinical observations, patient history, and epidemiological information. The expected result is Negative.  Fact Sheet for Patients:  PinkCheek.be  Fact Sheet for Healthcare Providers:  GravelBags.it  This test is no t yet approved or cleared by the [place] FDA and  has been authorized for detection and/or diagnosis of SARS-CoV-2 by FDA under an Emergency Use Authorization (EUA). This EUA will remain  in effect (meaning this test can be used) for the duration of the COVID-19 declaration under Section 564(b)(1) of the Act, 21 U.S.C. section 360bbb-3(b)(1), unless the authorization is terminated or revoked sooner.     Influenza A by PCR NEGATIVE NEGATIVE Final   Influenza B by PCR NEGATIVE NEGATIVE Final    Comment: (NOTE) The Xpert Xpress SARS-CoV-2/FLU/RSV assay is intended as an aid in  the diagnosis of influenza from Nasopharyngeal swab specimens and  should not be used as a sole basis for treatment. Nasal washings and  aspirates are unacceptable for Xpert Xpress SARS-CoV-2/FLU/RSV  testing.  Fact Sheet for Patients: PinkCheek.be  Fact Sheet for Healthcare Providers: GravelBags.it  This test is not yet approved or cleared by the [place] FDA and  has been  authorized for detection and/or diagnosis of SARS-CoV-2 by  FDA under an Emergency Use Authorization (EUA). This EUA will remain  in effect (meaning this test can be used) for the duration of the  Covid-19 declaration under Section 564(b)(1) of the Act, 21  U.S.C. section 360bbb-3(b)(1), unless the authorization is  terminated or revoked. Performed at [facility], [place] [address] [place], [place] [address]   Blood culture (routine x 2)     Status: None (Preliminary result)   Collection Time: 09/07/20 10:14 PM   Specimen: BLOOD  Result Value Ref Range Status   Specimen Description BLOOD LEFT ANTECUBITAL  Final   Special Requests   Final    BOTTLES DRAWN AEROBIC AND ANAEROBIC Blood Culture adequate volume   Culture   Final    NO GROWTH < 12 HOURS Performed at [facility], [place] [address], [place], [place] [address]    Report Status PENDING  Incomplete  Blood culture (routine x 2)     Status: None (Preliminary result)   Collection Time: 09/07/20 10:23 PM   Specimen: BLOOD RIGHT HAND  Result Value Ref Range Status   Specimen Description BLOOD RIGHT HAND  Final   Special Requests   Final    BOTTLES DRAWN AEROBIC AND ANAEROBIC Blood Culture adequate volume   Culture   Final    NO GROWTH < 12 HOURS Performed at [facility], [place] [address] [place], [place] [address]    Report Status PENDING  Incomplete    Procedures/Studies: DG  Chest Portable 1 View  Result Date: 09/07/2020 CLINICAL DATA:  Shortness of breath EXAM: PORTABLE CHEST 1 VIEW COMPARISON:  April 02, 2020 FINDINGS: No pneumothorax. Cardiomegaly. The hila and mediastinum are unremarkable. No pulmonary nodules, masses, focal infiltrates, or overt edema. IMPRESSION: No active disease. Electronically Signed   By: Dorise Bullion III M.D   On: 09/07/2020 16:44   ECHOCARDIOGRAM COMPLETE  Result Date: 09/08/2020    ECHOCARDIOGRAM REPORT   Patient Name:   [name] Date of Exam: 09/08/2020 Medical Rec #:   [id number]    Height:       69.0 in Accession #:    [id number]   Weight:       175.5 lb Date of Birth:  Oct 23, 1932    BSA:          1.954 m Patient Age:    86 years     BP:           120/51 mmHg Patient Gender: M            HR:           51 bpm. Exam Location:  Inpatient Procedure: 2D Echo, Cardiac Doppler and Color Doppler Indications:    R06.02 SOB  History:        Patient has prior history of Echocardiogram examinations, most                 recent 07/27/2019. Cardiomyopathy and CHF, CAD, Abnormal ECG,                 Aortic Valve Disease, Arrythmias:Atrial Fibrillation and LBBB,                 Signs/Symptoms:Chest Pain; Risk Factors:Hypertension, Diabetes                 and Dyslipidemia. Cancer. Aortic stenosis.  Sonographer:    Roseanna Rainbow RDCS Referring Phys: [id number] [place]  Sonographer Comments: Technically difficult study due to poor echo windows, no subcostal window and suboptimal apical window. IMPRESSIONS  1. Left ventricular ejection fraction, by estimation, is 50 to 55%. The left ventricle has low normal function. The left ventricle demonstrates regional wall motion abnormalities (see scoring diagram/findings for description). There is severe left ventricular hypertrophy. Left ventricular diastolic function could not be evaluated. There is severe hypokinesis of the left ventricular, basal-mid inferior wall.  2. Right ventricular systolic function is moderately reduced. The right ventricular size is normal.  3. Right atrial size was mildly dilated.  4. The mitral valve is abnormal. Trivial mitral valve regurgitation.  5. The aortic valve is calcified. Aortic valve regurgitation is not visualized. Moderate to severe aortic valve stenosis. Aortic valve area, by VTI measures 0.87 cm. Aortic valve mean gradient measures 22.0 mmHg. Aortic valve Vmax measures 2.99 m/s. DI  of 0.25.  6. Aortic dilatation noted. There is mild dilatation of the ascending aorta, measuring 40 mm.  7. The inferior vena  cava is dilated in size with <50% respiratory variability, suggesting right atrial pressure of 15 mmHg. Comparison(s): Changes from prior study are noted. 07/27/2019: LVEF 60-65%, severe LVH, mild AS - peak and mean gradients of 15/26 mmHg. FINDINGS  Left Ventricle: Left ventricular ejection fraction, by estimation, is 50 to 55%. The left ventricle has low normal function. The left ventricle demonstrates regional wall motion abnormalities. Severe hypokinesis of the left ventricular, basal-mid inferior wall. The left ventricular internal cavity size was normal in size. There is severe left ventricular hypertrophy.  Abnormal (paradoxical) septal motion, consistent with left bundle branch block. Left ventricular diastolic function could not be evaluated due to atrial fibrillation. Left ventricular diastolic function could not be evaluated. Right Ventricle: The right ventricular size is normal. No increase in right ventricular wall thickness. Right ventricular systolic function is moderately reduced. Left Atrium: Left atrial size was normal in size. Right Atrium: Right atrial size was mildly dilated. Pericardium: There is no evidence of pericardial effusion. Mitral Valve: The mitral valve is abnormal. Mild to moderate mitral annular calcification. Trivial mitral valve regurgitation. Tricuspid Valve: The tricuspid valve is grossly normal. Tricuspid valve regurgitation is not demonstrated. Aortic Valve: The aortic valve is calcified. Aortic valve regurgitation is not visualized. Moderate to severe aortic stenosis is present. Aortic valve mean gradient measures 22.0 mmHg. Aortic valve peak gradient measures 35.8 mmHg. Aortic valve area, by VTI measures 0.87 cm. Pulmonic Valve: The pulmonic valve was grossly normal. Pulmonic valve regurgitation is trivial. Aorta: Aortic dilatation noted. There is mild dilatation of the ascending aorta, measuring 40 mm. Venous: The inferior vena cava is dilated in size with less than 50%  respiratory variability, suggesting right atrial pressure of 15 mmHg. IAS/Shunts: No atrial level shunt detected by color flow Doppler.  LEFT VENTRICLE PLAX 2D LVIDd:         3.80 cm LVIDs:         3.15 cm LV PW:         1.90 cm LV IVS:        2.00 cm LVOT diam:     2.10 cm LV SV:         57 LV SV Index:   29 LVOT Area:     3.46 cm  LV Volumes (MOD) LV vol d, MOD A2C: 115.0 ml LV vol d, MOD A4C: 117.0 ml LV vol s, MOD A2C: 47.4 ml LV vol s, MOD A4C: 51.6 ml LV SV MOD A2C:     67.6 ml LV SV MOD A4C:     117.0 ml LV SV MOD BP:      64.7 ml RIGHT VENTRICLE            IVC RV S prime:     5.12 cm/s  IVC diam: 2.40 cm TAPSE (M-mode): 0.7 cm LEFT ATRIUM             Index       RIGHT ATRIUM           Index LA diam:        5.00 cm 2.56 cm/m  RA Area:     21.30 cm LA Vol (A2C):   57.7 ml 29.52 ml/m RA Volume:   67.00 ml  34.28 ml/m LA Vol (A4C):   62.3 ml 31.88 ml/m LA Biplane Vol: 62.0 ml 31.72 ml/m  AORTIC VALVE AV Area (Vmax):    0.86 cm AV Area (Vmean):   0.80 cm AV Area (VTI):     0.87 cm AV Vmax:           299.00 cm/s AV Vmean:          226.000 cm/s AV VTI:            0.659 m AV Peak Grad:      35.8 mmHg AV Mean Grad:      22.0 mmHg LVOT Vmax:         74.20 cm/s LVOT Vmean:        52.300 cm/s LVOT VTI:  0.166 m LVOT/AV VTI ratio: 0.25  AORTA Ao Root diam: 3.30 cm Ao Asc diam:  4.00 cm MITRAL VALVE MV Area (PHT): 4.52 cm     SHUNTS MV Decel Time: 168 msec     Systemic VTI:  0.17 m MV E velocity: 117.00 cm/s  Systemic Diam: 2.10 cm Lyman Bishop MD Electronically signed by Lyman Bishop MD Signature Date/Time: 09/08/2020/10:59:23 AM    Final     Time coordinating discharge: Over 30 minutes  SIGNED:   Guilford Shi, MD  [facility] 09/08/2020, 3:19 PM

## 2020-09-10 ENCOUNTER — Encounter: Payer: Self-pay | Admitting: Family Medicine

## 2020-09-10 ENCOUNTER — Ambulatory Visit: Payer: Medicare PPO | Admitting: Family Medicine

## 2020-09-10 ENCOUNTER — Other Ambulatory Visit: Payer: Self-pay

## 2020-09-10 VITALS — BP 102/64 | HR 48 | Temp 97.7°F | Wt 180.4 lb

## 2020-09-10 DIAGNOSIS — J209 Acute bronchitis, unspecified: Secondary | ICD-10-CM

## 2020-09-10 DIAGNOSIS — E1121 Type 2 diabetes mellitus with diabetic nephropathy: Secondary | ICD-10-CM

## 2020-09-10 DIAGNOSIS — J440 Chronic obstructive pulmonary disease with acute lower respiratory infection: Secondary | ICD-10-CM

## 2020-09-10 DIAGNOSIS — I350 Nonrheumatic aortic (valve) stenosis: Secondary | ICD-10-CM | POA: Diagnosis not present

## 2020-09-10 LAB — POCT GLUCOSE (DEVICE FOR HOME USE): POC Glucose: 268 mg/dl — AB (ref 70–99)

## 2020-09-10 NOTE — Progress Notes (Signed)
Established Patient Office Visit  Subjective:  Patient ID: [name], male    DOB: [birth year]  Age: 86 y.o. MRN: [id number]  CC:  Chief Complaint  Patient presents with  . Hospitalization Follow-up    HPI [name] presents for hospital follow-up.  He has chronic problems including history of chronic heart failure, aortic stenosis, CAD, hypertension, peripheral vascular disease, COPD, type 2 diabetes with nephropathy, chronic kidney disease, history of prostate cancer  He was admitted on 09/07/2020 with increased shortness of breath and also described some fevers.  He states that last Thursday he noticed increasing weakness and increasing shortness of breath.  By Friday had reported fever at home 101.9 and Sunday this was 100.4.  He had increased shortness of breath but no cough and no increased weight or peripheral edema issues.  In the ER he was noted to be tachypneic with sodium 121 and mildly elevated white blood count 11.5 thousand.  BNP level 426.  Respiratory panel for flu and Covid negative.  Urinalysis unremarkable.  Chest x-ray showed no acute findings.  He apparently had some wheezing on exam and was given IV Solu-Medrol 125 mg and Lasix in the ER  He was given 1 dose of 20 mg prednisone but blood sugars jumped in the 300s and none further were given.  Even though he had no pneumonia he was started on antibiotics because of his low-grade fever.  Feels his breathing is at baseline or better at this time.  Home weights are stable.  His remained afebrile.  Was discharged on Omnicef.  He did have a repeat echocardiogram which showed progression of aortic stenosis from moderate to severe.  He has outpatient follow-up with cardiology pending.  Main issue today is that his blood sugars have been elevated since he received the steroids.  His last A1c was 7.3%.  Fasting blood sugar this morning 326.  Currently takes only Januvia 50 mg daily.  Past Medical History:  Diagnosis Date  .  Arthritis   . CAD (coronary artery disease)    a. Cath September 2015 LIMA to the LAD patent, SVG to PDA patent, SVG to posterior lateral patent, SVG to OM with a 90% in-stent restenosis at an anastomotic lesion. This was treated with angioplasty. b. cath 04/01/2015 95% ISR in SVG to OM treated with 2.75x24 Synergy DES postdilated to 3.41mm, all other grafts patent  . Cataract    surgery,B/L  . CHF (congestive heart failure) ([place])   . Chronic kidney disease    nephrolithiasis  . Diabetes mellitus    TYPE 2  . GERD (gastroesophageal reflux disease)   . Heart murmur   . Hernia   . Hypercholesterolemia   . Hypertension   . Incontinence    hx  over 1 year,leaks without awareness  . Other and unspecified diseases of appendix   . PAF (paroxysmal atrial fibrillation) ([place])    in the setting of ischemia on 03/31/2015  . Pancreatitis   . Prostate CA ([place]) 03/01/04   prostate bx=Adenocarcinoam,gleason 3+4=7,PSA=6.75  . Shortness of breath dyspnea   . Ulcer    hx gastric    Past Surgical History:  Procedure Laterality Date  . APPENDECTOMY    . BACK SURGERY    . CARDIAC CATHETERIZATION N/A 04/01/2015   Procedure: Left Heart Cath and Cors/Grafts Angiography;  Surgeon: Jettie Booze, MD;  Location: [facility];  Service: Cardiovascular;  Laterality: N/A;  . CARDIAC CATHETERIZATION N/A 04/01/2015   Procedure:  Coronary Stent Intervention;  Surgeon: Jettie Booze, MD;  Location: [facility];  Service: Cardiovascular;  Laterality: N/A;  . CARDIOVERSION N/A 11/01/2019   Procedure: CARDIOVERSION;  Surgeon: Geralynn Rile, MD;  Location: [facility];  Service: Cardiovascular;  Laterality: N/A;  . CARPAL TUNNEL RELEASE  2004   both hands  . CORONARY ARTERY BYPASS GRAFT    . CYSTOSCOPY  08/23/12   incomplete emoptying bladder  . HERNIA REPAIR    . incision and drainage of right chest abscess    . LAPAROSCOPIC CHOLECYSTECTOMY  09/2009  . LEFT HEART CATHETERIZATION WITH  CORONARY ANGIOGRAM N/A 07/19/2014   Procedure: LEFT HEART CATHETERIZATION WITH CORONARY ANGIOGRAM;  Surgeon: Leonie Man, MD;  Location: [facility];  Service: Cardiovascular;  Laterality: N/A;  . RECTAL SURGERY    . ROBOT ASSISTED LAPAROSCOPIC RADICAL PROSTATECTOMY  2005    Family History  Problem Relation Age of Onset  . Cancer Mother        stomach    Social History   Socioeconomic History  . Marital status: Married    Spouse name: Not on file  . Number of children: 2  . Years of education: Not on file  . Highest education level: Not on file  Occupational History    Employer: RETIRED    Comment: Retired  Tobacco Use  . Smoking status: Former Smoker    Packs/day: 0.50    Years: 5.00    Pack years: 2.50    Types: Cigarettes    Quit date: 07/20/1972    Years since quitting: 48.1  . Smokeless tobacco: Never Used  . Tobacco comment: quit s  Vaping Use  . Vaping Use: Never used  Substance and Sexual Activity  . Alcohol use: No  . Drug use: No    Comment: quit smoking 40 years ago  . Sexual activity: Never  Other Topics Concern  . Not on file  Social History Narrative   Retired   Married      Social Determinants of Radio broadcast assistant Strain:   . Difficulty of Paying Living Expenses: Not on file  Food Insecurity:   . Worried About Charity fundraiser in the Last Year: Not on file  . Ran Out of Food in the Last Year: Not on file  Transportation Needs:   . Lack of Transportation (Medical): Not on file  . Lack of Transportation (Non-Medical): Not on file  Physical Activity:   . Days of Exercise per Week: Not on file  . Minutes of Exercise per Session: Not on file  Stress:   . Feeling of Stress : Not on file  Social Connections:   . Frequency of Communication with Friends and Family: Not on file  . Frequency of Social Gatherings with Friends and Family: Not on file  . Attends Religious Services: Not on file  . Active Member of Clubs or  Organizations: Not on file  . Attends Archivist Meetings: Not on file  . Marital Status: Not on file  Intimate Partner Violence:   . Fear of Current or Ex-Partner: Not on file  . Emotionally Abused: Not on file  . Physically Abused: Not on file  . Sexually Abused: Not on file    Outpatient Medications Prior to Visit  Medication Sig Dispense Refill  . acetaminophen (TYLENOL) 500 MG tablet Take 1,000 mg by mouth every 4 (four) hours as needed for fever or headache (pain).    Marland Kitchen albuterol (  PROVENTIL) (2.5 MG/3ML) 0.083% nebulizer solution Take 3 mLs (2.5 mg total) by nebulization every 4 (four) hours as needed for wheezing or shortness of breath. 75 mL 3  . albuterol (VENTOLIN HFA) 108 (90 Base) MCG/ACT inhaler Inhale 2 puffs into the lungs every 6 (six) hours as needed. (Patient taking differently: Inhale 2 puffs into the lungs every 6 (six) hours as needed for wheezing or shortness of breath. ) 18 g 5  . amLODipine (NORVASC) 5 MG tablet Take 1 tablet (5 mg total) by mouth daily. 90 tablet 3  . blood glucose meter kit and supplies KIT Dispense based on patient and insurance preference. Use up to four times daily as directed. (FOR ICD-9 250.00, 250.01). 1 each 0  . budesonide-formoterol (SYMBICORT) 160-4.5 MCG/ACT inhaler Inhale 2 puffs into the lungs in the morning and at bedtime. 1 each 12  . carvedilol (COREG) 6.25 MG tablet TAKE 1 TABLET (6.25 MG TOTAL) BY MOUTH 2 (TWO) TIMES DAILY WITH A MEAL. 180 tablet 3  . cefdinir (OMNICEF) 300 MG capsule Take 1 capsule (300 mg total) by mouth daily. 5 capsule 0  . clopidogrel (PLAVIX) 75 MG tablet TAKE 1 TABLET (75 MG TOTAL) BY MOUTH DAILY. (Patient taking differently: Take 75 mg by mouth daily. ) 90 tablet 3  . docusate sodium (STOOL SOFTENER) 100 MG capsule Take 1 capsule (100 mg total) by mouth 2 (two) times daily as needed for mild constipation. (Patient taking differently: Take 100 mg by mouth at bedtime. ) 30 capsule 0  . ELIQUIS 2.5 MG  TABS tablet TAKE 1 TABLET BY MOUTH TWICE A DAY (Patient taking differently: Take 2.5 mg by mouth 2 (two) times daily. ) 180 tablet 1  . furosemide (LASIX) 40 MG tablet Take 1 tablet (40 mg total) by mouth 2 (two) times daily. 180 tablet 3  . guaiFENesin-dextromethorphan (ROBITUSSIN DM) 100-10 MG/5ML syrup Take 5 mLs by mouth every 6 (six) hours as needed for cough. 118 mL 0  . hydrocortisone 2.5 % cream Apply 1 application topically daily as needed (facial breakouts).    . Multiple Vitamins-Minerals (ICAPS AREDS 2 PO) Take 1 capsule by mouth daily.    . nitroGLYCERIN (NITROSTAT) 0.4 MG SL tablet Place 1 tablet (0.4 mg total) under the tongue every 5 (five) minutes x 3 doses as needed for chest pain (if no relief after 3rd dose, proceed to the ED for an evaluation). 75 tablet 1  . pantoprazole (PROTONIX) 40 MG tablet TAKE 1 TABLET BY MOUTH EVERY DAY (Patient taking differently: Take 40 mg by mouth daily. ) 90 tablet 1  . Polyethyl Glycol-Propyl Glycol (LUBRICANT EYE DROPS) 0.4-0.3 % SOLN Place 1-2 drops into both eyes 3 (three) times daily as needed (dry/irritated eyes.).    Marland Kitchen rosuvastatin (CRESTOR) 20 MG tablet TAKE 1 TABLET BY MOUTH EVERY DAY 90 tablet 0  . sitaGLIPtin (JANUVIA) 50 MG tablet Take 1 tablet (50 mg total) by mouth daily. 90 tablet 3  . spironolactone (ALDACTONE) 25 MG tablet TAKE 1 TABLET BY MOUTH EVERY DAY (Patient taking differently: Take 25 mg by mouth daily. ) 90 tablet 2   No facility-administered medications prior to visit.    Allergies  Allergen Reactions  . Codeine Other (See Comments)    Makes him feel goofy  . Morphine Other (See Comments)    Nightmares and felt bad    ROS Review of Systems  Constitutional: Negative for chills and fever.  Respiratory: Negative for cough and shortness of breath.  Cardiovascular: Negative for chest pain.  Gastrointestinal: Negative for abdominal pain.  Genitourinary: Negative for dysuria.      Objective:    Physical  Exam Vitals reviewed.  Constitutional:      Appearance: Normal appearance.  Cardiovascular:     Rate and Rhythm: Regular rhythm.     Heart sounds: Murmur heard.   Pulmonary:     Effort: Pulmonary effort is normal.  Musculoskeletal:     Right lower leg: No edema.     Left lower leg: No edema.  Neurological:     Mental Status: He is alert.     BP 102/64 (BP Location: Left Arm, Patient Position: Sitting, Cuff Size: Large)   Pulse (!) 48   Temp 97.7 F (36.5 C) (Oral)   Wt 180 lb 6.4 oz (81.8 kg)   SpO2 97%   BMI 26.64 kg/m  Wt Readings from Last 3 Encounters:  09/10/20 180 lb 6.4 oz (81.8 kg)  09/08/20 175 lb 7.8 oz (79.6 kg)  08/07/20 183 lb (83 kg)     Health Maintenance Due  Topic Date Due  . OPHTHALMOLOGY EXAM  03/11/2017    There are no preventive care reminders to display for this patient.  Lab Results  Component Value Date   TSH 3.82 01/14/2020   Lab Results  Component Value Date   WBC 11.1 (H) 09/08/2020   HGB 12.9 (L) 09/08/2020   HCT 37.8 (L) 09/08/2020   MCV 88.1 09/08/2020   PLT 114 (L) 09/08/2020   Lab Results  Component Value Date   NA 132 (L) 09/08/2020   K 4.0 09/08/2020   CO2 21 (L) 09/08/2020   GLUCOSE 261 (H) 09/08/2020   BUN 32 (H) 09/08/2020   CREATININE 2.20 (H) 09/08/2020   BILITOT 1.8 (H) 09/08/2020   ALKPHOS 67 09/08/2020   AST 14 (L) 09/08/2020   ALT 19 09/08/2020   PROT 6.9 09/08/2020   ALBUMIN 3.1 (L) 09/08/2020   CALCIUM 9.3 09/08/2020   ANIONGAP 13 09/08/2020   GFR 32.30 (L) 01/14/2020   Lab Results  Component Value Date   CHOL 109 01/14/2020   Lab Results  Component Value Date   HDL 26.50 (L) 01/14/2020   Lab Results  Component Value Date   LDLCALC 56 01/14/2020   Lab Results  Component Value Date   TRIG 132.0 01/14/2020   Lab Results  Component Value Date   CHOLHDL 4 01/14/2020   Lab Results  Component Value Date   HGBA1C 7.3 (H) 09/07/2020      Assessment & Plan:   #1 recent dyspnea  prompting hospitalization.  Possibly combination of acute exacerbation of COPD with some heart failure.  Patient improved with steroids and Lasix. Evidence for progression of aortic stenosis which may be contributing -Continue usual inhalers -Follow-up promptly for any recurrent increased shortness of breath  #2 type 2 diabetes with control exacerbated by recent steroids -Continue Januvia -Recommend short-term use of Basaglar insulin 8 units once daily and monitor blood sugars regularly.  Blood sugar should improve as his prednisone clears out of system.  As fasting blood sugars taper back down to 130 or less stop the Basaglar -He has follow-up in early January.  We will discuss possible SGLT2 medication such as Jardiance at that time  #3 aortic stenosis with evidence for progression by recent echocardiogram -Continue close follow-up with cardiology  No orders of the defined types were placed in this encounter.   Follow-up: No follow-ups on file.    Darnell Level  Elease Hashimoto, MD

## 2020-09-10 NOTE — Patient Instructions (Signed)
Start the Basaglar insulin and give 8 units once daily  Check blood sugars daily and if fasting sugars getting back down to 130 or less leave off the Basaglar insulin.    Keep sugar and starch intake down  Blood sugars should start to come down as we get away from the prednisone.    Continue the Januvia

## 2020-09-13 LAB — CULTURE, BLOOD (ROUTINE X 2)
Culture: NO GROWTH
Culture: NO GROWTH
Special Requests: ADEQUATE
Special Requests: ADEQUATE

## 2020-09-15 ENCOUNTER — Other Ambulatory Visit: Payer: Self-pay

## 2020-09-15 DIAGNOSIS — N1831 Chronic kidney disease, stage 3a: Secondary | ICD-10-CM

## 2020-09-16 LAB — BASIC METABOLIC PANEL
BUN/Creatinine Ratio: 14 (ref 10–24)
BUN: 25 mg/dL (ref 8–27)
CO2: 22 mmol/L (ref 20–29)
Calcium: 9.2 mg/dL (ref 8.6–10.2)
Chloride: 97 mmol/L (ref 96–106)
Creatinine, Ser: 1.84 mg/dL — ABNORMAL HIGH (ref 0.76–1.27)
GFR calc Af Amer: 38 mL/min/{1.73_m2} — ABNORMAL LOW (ref >59)
GFR calc non Af Amer: 33 mL/min/{1.73_m2} — ABNORMAL LOW (ref >59)
Glucose: 196 mg/dL — ABNORMAL HIGH (ref 65–99)
Potassium: 4 mmol/L (ref 3.5–5.2)
Sodium: 135 mmol/L (ref 134–144)

## 2020-09-20 ENCOUNTER — Emergency Department (HOSPITAL_COMMUNITY): Payer: Medicare PPO

## 2020-09-20 ENCOUNTER — Inpatient Hospital Stay (HOSPITAL_COMMUNITY)
Admission: EM | Admit: 2020-09-20 | Discharge: 2020-09-24 | DRG: 250 | Disposition: A | Discharge status: Home / Self Care | Payer: Medicare PPO | Attending: Internal Medicine | Admitting: Internal Medicine

## 2020-09-20 ENCOUNTER — Other Ambulatory Visit: Payer: Self-pay

## 2020-09-20 ENCOUNTER — Encounter (HOSPITAL_COMMUNITY): Payer: Self-pay | Admitting: Emergency Medicine

## 2020-09-20 DIAGNOSIS — Z809 Family history of malignant neoplasm, unspecified: Secondary | ICD-10-CM

## 2020-09-20 DIAGNOSIS — E876 Hypokalemia: Secondary | ICD-10-CM | POA: Diagnosis present

## 2020-09-20 DIAGNOSIS — Z9861 Coronary angioplasty status: Secondary | ICD-10-CM

## 2020-09-20 DIAGNOSIS — Z885 Allergy status to narcotic agent status: Secondary | ICD-10-CM

## 2020-09-20 DIAGNOSIS — Z9049 Acquired absence of other specified parts of digestive tract: Secondary | ICD-10-CM

## 2020-09-20 DIAGNOSIS — G4700 Insomnia, unspecified: Secondary | ICD-10-CM | POA: Diagnosis present

## 2020-09-20 DIAGNOSIS — E1165 Type 2 diabetes mellitus with hyperglycemia: Secondary | ICD-10-CM | POA: Diagnosis present

## 2020-09-20 DIAGNOSIS — I2510 Atherosclerotic heart disease of native coronary artery without angina pectoris: Secondary | ICD-10-CM | POA: Diagnosis present

## 2020-09-20 DIAGNOSIS — I255 Ischemic cardiomyopathy: Secondary | ICD-10-CM | POA: Diagnosis present

## 2020-09-20 DIAGNOSIS — Z87891 Personal history of nicotine dependence: Secondary | ICD-10-CM

## 2020-09-20 DIAGNOSIS — E785 Hyperlipidemia, unspecified: Secondary | ICD-10-CM | POA: Diagnosis present

## 2020-09-20 DIAGNOSIS — I130 Hypertensive heart and chronic kidney disease with heart failure and stage 1 through stage 4 chronic kidney disease, or unspecified chronic kidney disease: Secondary | ICD-10-CM | POA: Diagnosis present

## 2020-09-20 DIAGNOSIS — I25119 Atherosclerotic heart disease of native coronary artery with unspecified angina pectoris: Secondary | ICD-10-CM | POA: Diagnosis not present

## 2020-09-20 DIAGNOSIS — I447 Left bundle-branch block, unspecified: Secondary | ICD-10-CM | POA: Diagnosis not present

## 2020-09-20 DIAGNOSIS — I350 Nonrheumatic aortic (valve) stenosis: Secondary | ICD-10-CM | POA: Diagnosis not present

## 2020-09-20 DIAGNOSIS — I25710 Atherosclerosis of autologous vein coronary artery bypass graft(s) with unstable angina pectoris: Secondary | ICD-10-CM | POA: Diagnosis not present

## 2020-09-20 DIAGNOSIS — R0789 Other chest pain: Secondary | ICD-10-CM | POA: Diagnosis not present

## 2020-09-20 DIAGNOSIS — Y831 Surgical operation with implant of artificial internal device as the cause of abnormal reaction of the patient, or of later complication, without mention of misadventure at the time of the procedure: Secondary | ICD-10-CM | POA: Diagnosis present

## 2020-09-20 DIAGNOSIS — R079 Chest pain, unspecified: Secondary | ICD-10-CM | POA: Diagnosis not present

## 2020-09-20 DIAGNOSIS — M1990 Unspecified osteoarthritis, unspecified site: Secondary | ICD-10-CM | POA: Diagnosis present

## 2020-09-20 DIAGNOSIS — H9190 Unspecified hearing loss, unspecified ear: Secondary | ICD-10-CM | POA: Diagnosis present

## 2020-09-20 DIAGNOSIS — Z7901 Long term (current) use of anticoagulants: Secondary | ICD-10-CM

## 2020-09-20 DIAGNOSIS — Z8546 Personal history of malignant neoplasm of prostate: Secondary | ICD-10-CM

## 2020-09-20 DIAGNOSIS — J4540 Moderate persistent asthma, uncomplicated: Secondary | ICD-10-CM | POA: Diagnosis present

## 2020-09-20 DIAGNOSIS — E1122 Type 2 diabetes mellitus with diabetic chronic kidney disease: Secondary | ICD-10-CM | POA: Diagnosis present

## 2020-09-20 DIAGNOSIS — Z20822 Contact with and (suspected) exposure to covid-19: Secondary | ICD-10-CM | POA: Diagnosis present

## 2020-09-20 DIAGNOSIS — R509 Fever, unspecified: Secondary | ICD-10-CM | POA: Diagnosis not present

## 2020-09-20 DIAGNOSIS — Z79899 Other long term (current) drug therapy: Secondary | ICD-10-CM

## 2020-09-20 DIAGNOSIS — T82855A Stenosis of coronary artery stent, initial encounter: Secondary | ICD-10-CM | POA: Diagnosis not present

## 2020-09-20 DIAGNOSIS — I5043 Acute on chronic combined systolic (congestive) and diastolic (congestive) heart failure: Secondary | ICD-10-CM | POA: Diagnosis present

## 2020-09-20 DIAGNOSIS — Z7982 Long term (current) use of aspirin: Secondary | ICD-10-CM

## 2020-09-20 DIAGNOSIS — Z951 Presence of aortocoronary bypass graft: Secondary | ICD-10-CM

## 2020-09-20 DIAGNOSIS — I25110 Atherosclerotic heart disease of native coronary artery with unstable angina pectoris: Secondary | ICD-10-CM | POA: Diagnosis present

## 2020-09-20 DIAGNOSIS — I4891 Unspecified atrial fibrillation: Secondary | ICD-10-CM | POA: Diagnosis not present

## 2020-09-20 DIAGNOSIS — I4811 Longstanding persistent atrial fibrillation: Secondary | ICD-10-CM | POA: Diagnosis not present

## 2020-09-20 DIAGNOSIS — I5033 Acute on chronic diastolic (congestive) heart failure: Secondary | ICD-10-CM | POA: Diagnosis not present

## 2020-09-20 DIAGNOSIS — I208 Other forms of angina pectoris: Secondary | ICD-10-CM | POA: Diagnosis not present

## 2020-09-20 DIAGNOSIS — R001 Bradycardia, unspecified: Secondary | ICD-10-CM | POA: Diagnosis not present

## 2020-09-20 DIAGNOSIS — I509 Heart failure, unspecified: Secondary | ICD-10-CM | POA: Diagnosis present

## 2020-09-20 DIAGNOSIS — I480 Paroxysmal atrial fibrillation: Secondary | ICD-10-CM | POA: Diagnosis present

## 2020-09-20 DIAGNOSIS — I5032 Chronic diastolic (congestive) heart failure: Secondary | ICD-10-CM | POA: Diagnosis not present

## 2020-09-20 DIAGNOSIS — I4819 Other persistent atrial fibrillation: Secondary | ICD-10-CM | POA: Diagnosis present

## 2020-09-20 DIAGNOSIS — R0602 Shortness of breath: Secondary | ICD-10-CM | POA: Diagnosis not present

## 2020-09-20 DIAGNOSIS — N183 Chronic kidney disease, stage 3 unspecified: Secondary | ICD-10-CM | POA: Diagnosis present

## 2020-09-20 DIAGNOSIS — J449 Chronic obstructive pulmonary disease, unspecified: Secondary | ICD-10-CM | POA: Diagnosis present

## 2020-09-20 DIAGNOSIS — N1832 Chronic kidney disease, stage 3b: Secondary | ICD-10-CM | POA: Diagnosis present

## 2020-09-20 DIAGNOSIS — R778 Other specified abnormalities of plasma proteins: Secondary | ICD-10-CM

## 2020-09-20 DIAGNOSIS — E871 Hypo-osmolality and hyponatremia: Secondary | ICD-10-CM | POA: Diagnosis not present

## 2020-09-20 DIAGNOSIS — K219 Gastro-esophageal reflux disease without esophagitis: Secondary | ICD-10-CM | POA: Diagnosis present

## 2020-09-20 DIAGNOSIS — Z7902 Long term (current) use of antithrombotics/antiplatelets: Secondary | ICD-10-CM

## 2020-09-20 DIAGNOSIS — I200 Unstable angina: Secondary | ICD-10-CM

## 2020-09-20 DIAGNOSIS — Z7951 Long term (current) use of inhaled steroids: Secondary | ICD-10-CM

## 2020-09-20 DIAGNOSIS — I2089 Other forms of angina pectoris: Secondary | ICD-10-CM | POA: Diagnosis present

## 2020-09-20 DIAGNOSIS — I10 Essential (primary) hypertension: Secondary | ICD-10-CM | POA: Diagnosis present

## 2020-09-20 DIAGNOSIS — Z9079 Acquired absence of other genital organ(s): Secondary | ICD-10-CM

## 2020-09-20 DIAGNOSIS — Z7984 Long term (current) use of oral hypoglycemic drugs: Secondary | ICD-10-CM

## 2020-09-20 HISTORY — DX: Nonrheumatic aortic (valve) stenosis: I35.0

## 2020-09-20 LAB — CBC
HCT: 42.6 % (ref 39.0–52.0)
HCT: 35.7 % — ABNORMAL LOW (ref 39.0–52.0)
Hemoglobin: 14.2 g/dL (ref 13.0–17.0)
Hemoglobin: 12.8 g/dL — ABNORMAL LOW (ref 13.0–17.0)
MCH: 30 pg (ref 26.0–34.0)
MCH: 31.1 pg (ref 26.0–34.0)
MCHC: 33.3 g/dL (ref 30.0–36.0)
MCHC: 35.9 g/dL (ref 30.0–36.0)
MCV: 89.9 fL (ref 80.0–100.0)
MCV: 86.7 fL (ref 80.0–100.0)
Platelets: 173 10*3/uL (ref 150–400)
Platelets: 137 10*3/uL — ABNORMAL LOW (ref 150–400)
RBC: 4.74 MIL/uL (ref 4.22–5.81)
RBC: 4.12 MIL/uL — ABNORMAL LOW (ref 4.22–5.81)
RDW: 12.1 % (ref 11.5–15.5)
RDW: 12.2 % (ref 11.5–15.5)
WBC: 18.5 10*3/uL — ABNORMAL HIGH (ref 4.0–10.5)
WBC: 14.3 10*3/uL — ABNORMAL HIGH (ref 4.0–10.5)
nRBC: 0 % (ref 0.0–0.2)
nRBC: 0 % (ref 0.0–0.2)

## 2020-09-20 LAB — GLUCOSE, CAPILLARY
Glucose-Capillary: 310 mg/dL — ABNORMAL HIGH (ref 70–99)
Glucose-Capillary: 219 mg/dL — ABNORMAL HIGH (ref 70–99)

## 2020-09-20 LAB — BASIC METABOLIC PANEL
Anion gap: 13 (ref 5–15)
BUN: 20 mg/dL (ref 8–23)
CO2: 24 mmol/L (ref 22–32)
Calcium: 9.8 mg/dL (ref 8.9–10.3)
Chloride: 94 mmol/L — ABNORMAL LOW (ref 98–111)
Creatinine, Ser: 1.79 mg/dL — ABNORMAL HIGH (ref 0.61–1.24)
GFR, Estimated: 37 mL/min — ABNORMAL LOW (ref >60)
Glucose, Bld: 258 mg/dL — ABNORMAL HIGH (ref 70–99)
Potassium: 4.3 mmol/L (ref 3.5–5.1)
Sodium: 131 mmol/L — ABNORMAL LOW (ref 135–145)

## 2020-09-20 LAB — RESP PANEL BY RT-PCR (FLU A&B, COVID) ARPGX2
Influenza A by PCR: NEGATIVE
Influenza B by PCR: NEGATIVE
SARS Coronavirus 2 by RT PCR: NEGATIVE

## 2020-09-20 LAB — TROPONIN I (HIGH SENSITIVITY)
Troponin I (High Sensitivity): 67 ng/L — ABNORMAL HIGH (ref <18)
Troponin I (High Sensitivity): 103 ng/L (ref <18)

## 2020-09-20 LAB — SEDIMENTATION RATE: Sed Rate: 53 mm/hr — ABNORMAL HIGH (ref 0–16)

## 2020-09-20 LAB — PROCALCITONIN: Procalcitonin: 0.5 ng/mL

## 2020-09-20 LAB — BRAIN NATRIURETIC PEPTIDE: B Natriuretic Peptide: 685.4 pg/mL — ABNORMAL HIGH (ref 0.0–100.0)

## 2020-09-20 LAB — D-DIMER, QUANTITATIVE: D-Dimer, Quant: 0.76 ug/mL-FEU — ABNORMAL HIGH (ref 0.00–0.50)

## 2020-09-20 MED ORDER — AMIODARONE HCL 200 MG PO TABS
400.0000 mg | ORAL_TABLET | Freq: Two times a day (BID) | ORAL | Status: DC
  Administered 2020-09-20 – 2020-09-22 (×4): 400 mg via ORAL
  Filled 2020-09-20 (×4): qty 2

## 2020-09-20 MED ORDER — INSULIN ASPART 100 UNIT/ML ~~LOC~~ SOLN
0.0000 [IU] | Freq: Three times a day (TID) | SUBCUTANEOUS | Status: DC
  Administered 2020-09-21: 1 [IU] via SUBCUTANEOUS
  Administered 2020-09-21 (×2): 2 [IU] via SUBCUTANEOUS
  Administered 2020-09-22: 3 [IU] via SUBCUTANEOUS
  Administered 2020-09-22: 2 [IU] via SUBCUTANEOUS
  Administered 2020-09-22: 3 [IU] via SUBCUTANEOUS
  Administered 2020-09-23 (×2): 2 [IU] via SUBCUTANEOUS
  Administered 2020-09-24: 3 [IU] via SUBCUTANEOUS
  Administered 2020-09-24: 1 [IU] via SUBCUTANEOUS

## 2020-09-20 MED ORDER — AMLODIPINE BESYLATE 5 MG PO TABS
5.0000 mg | ORAL_TABLET | Freq: Every day | ORAL | Status: DC
  Administered 2020-09-21 – 2020-09-24 (×3): 5 mg via ORAL
  Filled 2020-09-20 (×4): qty 1

## 2020-09-20 MED ORDER — ASPIRIN EC 81 MG PO TBEC
81.0000 mg | DELAYED_RELEASE_TABLET | Freq: Every day | ORAL | Status: DC
  Administered 2020-09-21: 81 mg via ORAL
  Filled 2020-09-20: qty 1

## 2020-09-20 MED ORDER — FUROSEMIDE 10 MG/ML IJ SOLN
40.0000 mg | Freq: Once | INTRAMUSCULAR | Status: AC
  Administered 2020-09-20: 40 mg via INTRAVENOUS
  Filled 2020-09-20: qty 4

## 2020-09-20 MED ORDER — IPRATROPIUM BROMIDE 0.02 % IN SOLN
0.5000 mg | Freq: Four times a day (QID) | RESPIRATORY_TRACT | Status: DC
  Administered 2020-09-20: 0.5 mg via RESPIRATORY_TRACT
  Filled 2020-09-20: qty 2.5

## 2020-09-20 MED ORDER — SODIUM CHLORIDE 0.9 % IV SOLN: 250.0000 mL | INTRAVENOUS | Status: DC | PRN

## 2020-09-20 MED ORDER — ACETAMINOPHEN 500 MG PO TABS: 1000.0000 mg | ORAL_TABLET | Freq: Four times a day (QID) | ORAL | Status: DC | PRN

## 2020-09-20 MED ORDER — NITROGLYCERIN 0.4 MG SL SUBL: 0.4000 mg | SUBLINGUAL_TABLET | SUBLINGUAL | Status: DC | PRN

## 2020-09-20 MED ORDER — IPRATROPIUM-ALBUTEROL 0.5-2.5 (3) MG/3ML IN SOLN: 3.0000 mL | RESPIRATORY_TRACT | Status: DC | PRN

## 2020-09-20 MED ORDER — FENTANYL CITRATE (PF) 100 MCG/2ML IJ SOLN: 50.0000 ug | Freq: Once | INTRAMUSCULAR | Status: DC

## 2020-09-20 MED ORDER — CLOPIDOGREL BISULFATE 75 MG PO TABS
75.0000 mg | ORAL_TABLET | Freq: Every day | ORAL | Status: DC
  Administered 2020-09-21 – 2020-09-24 (×4): 75 mg via ORAL
  Filled 2020-09-20 (×4): qty 1

## 2020-09-20 MED ORDER — FENTANYL CITRATE (PF) 100 MCG/2ML IJ SOLN: 50.0000 ug | INTRAMUSCULAR | Status: DC | PRN

## 2020-09-20 MED ORDER — LINAGLIPTIN 5 MG PO TABS
5.0000 mg | ORAL_TABLET | Freq: Every day | ORAL | Status: DC
  Administered 2020-09-21 – 2020-09-24 (×4): 5 mg via ORAL
  Filled 2020-09-20 (×4): qty 1

## 2020-09-20 MED ORDER — GUAIFENESIN-DM 100-10 MG/5ML PO SYRP: 5.0000 mL | ORAL_SOLUTION | Freq: Four times a day (QID) | ORAL | Status: DC | PRN

## 2020-09-20 MED ORDER — SPIRONOLACTONE 25 MG PO TABS
25.0000 mg | ORAL_TABLET | Freq: Every day | ORAL | Status: DC
  Administered 2020-09-21: 25 mg via ORAL
  Filled 2020-09-20 (×2): qty 1

## 2020-09-20 MED ORDER — DOCUSATE SODIUM 100 MG PO CAPS
100.0000 mg | ORAL_CAPSULE | Freq: Every day | ORAL | Status: DC
  Administered 2020-09-20 – 2020-09-22 (×3): 100 mg via ORAL
  Filled 2020-09-20 (×3): qty 1

## 2020-09-20 MED ORDER — SODIUM CHLORIDE 0.9% FLUSH
3.0000 mL | Freq: Two times a day (BID) | INTRAVENOUS | Status: DC
  Administered 2020-09-20 – 2020-09-22 (×6): 3 mL via INTRAVENOUS

## 2020-09-20 MED ORDER — APIXABAN 2.5 MG PO TABS
2.5000 mg | ORAL_TABLET | Freq: Two times a day (BID) | ORAL | Status: DC
  Administered 2020-09-20 – 2020-09-22 (×4): 2.5 mg via ORAL
  Filled 2020-09-20 (×5): qty 1

## 2020-09-20 MED ORDER — IPRATROPIUM BROMIDE 0.02 % IN SOLN: 0.5000 mg | Freq: Two times a day (BID) | RESPIRATORY_TRACT | Status: DC

## 2020-09-20 MED ORDER — FUROSEMIDE 40 MG PO TABS
40.0000 mg | ORAL_TABLET | Freq: Two times a day (BID) | ORAL | Status: DC
  Administered 2020-09-21: 40 mg via ORAL
  Filled 2020-09-20: qty 1

## 2020-09-20 MED ORDER — FLUTICASONE FUROATE-VILANTEROL 200-25 MCG/INH IN AEPB
1.0000 | INHALATION_SPRAY | Freq: Every day | RESPIRATORY_TRACT | Status: DC
  Administered 2020-09-24: 08:00:00 1 via RESPIRATORY_TRACT
  Filled 2020-09-20: qty 28

## 2020-09-20 MED ORDER — CARVEDILOL 6.25 MG PO TABS
6.2500 mg | ORAL_TABLET | Freq: Two times a day (BID) | ORAL | Status: DC
  Administered 2020-09-20 – 2020-09-24 (×7): 6.25 mg via ORAL
  Filled 2020-09-20 (×8): qty 1

## 2020-09-20 MED ORDER — PANTOPRAZOLE SODIUM 40 MG PO TBEC
40.0000 mg | DELAYED_RELEASE_TABLET | Freq: Every day | ORAL | Status: DC
  Administered 2020-09-21 – 2020-09-24 (×4): 40 mg via ORAL
  Filled 2020-09-20 (×4): qty 1

## 2020-09-20 MED ORDER — SODIUM CHLORIDE 0.9% FLUSH: 3.0000 mL | INTRAVENOUS | Status: DC | PRN

## 2020-09-20 MED ORDER — ROSUVASTATIN CALCIUM 20 MG PO TABS
20.0000 mg | ORAL_TABLET | Freq: Every day | ORAL | Status: DC
  Administered 2020-09-21 – 2020-09-24 (×4): 20 mg via ORAL
  Filled 2020-09-20 (×4): qty 1

## 2020-09-20 MED ORDER — ACETAMINOPHEN 325 MG PO TABS: 650.0000 mg | ORAL_TABLET | ORAL | Status: DC | PRN

## 2020-09-20 MED ORDER — NITROGLYCERIN 0.4 MG SL SUBL
0.4000 mg | SUBLINGUAL_TABLET | Freq: Once | SUBLINGUAL | Status: AC
  Administered 2020-09-20 (×3): 0.4 mg via SUBLINGUAL
  Filled 2020-09-20: qty 1

## 2020-09-20 MED ORDER — NITROGLYCERIN 0.4 MG SL SUBL: 0.4000 mg | SUBLINGUAL_TABLET | Freq: Once | SUBLINGUAL | Status: DC

## 2020-09-20 MED ORDER — HYDROCORTISONE 1 % EX CREA
1.0000 "application " | TOPICAL_CREAM | Freq: Every day | CUTANEOUS | Status: DC | PRN
  Filled 2020-09-20: qty 28

## 2020-09-20 MED ORDER — ONDANSETRON HCL 4 MG/2ML IJ SOLN: 4.0000 mg | Freq: Four times a day (QID) | INTRAMUSCULAR | Status: DC | PRN

## 2020-09-20 NOTE — ED Provider Notes (Signed)
[facility] EMERGENCY DEPARTMENT Provider Note   CSN: [id number] Arrival date & time: 09/20/20  0818     History Chief Complaint  Patient presents with  . Chest Pain    [name] is a 86 y.o. male.  Presents ER with concern for chest pain or shortness of breath.  Patient reports symptoms similar to presentation couple weeks ago when he was admitted.  Reports that symptoms worse with exertion.  States he has palpitations, heart pounding sensation, chest discomfort.  Episodes seem to come and go mostly at random, occurring both at rest and with exertion.  Currently pain is 5 out of 10 in severity, described as a tightness sensation.  Nonradiating.  Denies leg swelling.  Per review of chart, recent hospital admission.  Has extensive history of coronary artery disease s/p CABG, multiple stents.  Echo demonstrating new wall motion abnormality and increased aortic stenosis, now classified as severe AS.  Plan at time of discharge was to follow-up with cardiology outpatient to further discuss these abnormalities.  HPI     Past Medical History:  Diagnosis Date  . Arthritis   . CAD (coronary artery disease)    a. Cath September 2015 LIMA to the LAD patent, SVG to PDA patent, SVG to posterior lateral patent, SVG to OM with a 90% in-stent restenosis at an anastomotic lesion. This was treated with angioplasty. b. cath 04/01/2015 95% ISR in SVG to OM treated with 2.75x24 Synergy DES postdilated to 3.46m, all other grafts patent  . Cataract    surgery,B/L  . CHF (congestive heart failure) ([place]   . Chronic kidney disease    nephrolithiasis  . Diabetes mellitus    TYPE 2  . GERD (gastroesophageal reflux disease)   . Heart murmur   . Hernia   . Hypercholesterolemia   . Hypertension   . Incontinence    hx  over 1 year,leaks without awareness  . Other and unspecified diseases of appendix   . PAF (paroxysmal atrial fibrillation) ([place]    in the setting of ischemia on  03/31/2015  . Pancreatitis   . Prostate CA ([place] 03/01/04   prostate bx=Adenocarcinoam,gleason 3+4=7,PSA=6.75  . Shortness of breath dyspnea   . Ulcer    hx gastric    Patient Active Problem List   Diagnosis Date Noted  . Angina at rest ([id number] 09/20/2020  . CHF (congestive heart failure) ([facility] 09/20/2020  . COPD with acute bronchitis ([place] 09/08/2020  . Acute exacerbation of CHF (congestive heart failure) ([place] 09/07/2020  . COPD mixed type ([place] 04/03/2020  . Chronic systolic HF (heart failure) ([facility] 12/26/2019  . Systolic dysfunction with acute on chronic heart failure ([place] 10/23/2019  . Educated about COVID-19 virus infection 10/23/2019  . Persistent atrial fibrillation ([place] 08/29/2019  . Nonrheumatic aortic valve stenosis 08/29/2019  . Elevated troponin 07/27/2019  . HLD (hyperlipidemia) 07/27/2019  . GERD (gastroesophageal reflux disease) 07/27/2019  . Congestive heart failure (CHF) ([place] 07/26/2019  . Pure hypercholesterolemia   . Acute on chronic diastolic CHF (congestive heart failure) ([place]   . SOB (shortness of breath) 06/04/2019  . CKD (chronic kidney disease), stage III ([place] 06/04/2019  . Coronary artery disease of native artery of native heart with stable angina pectoris ([place] 03/24/2018  . Bilateral carotid artery stenosis 07/23/2017  . Palpitation 07/23/2017  . Urinary urgency 07/28/2016  . PAF (paroxysmal atrial fibrillation) ([place]   . DOE (dyspnea on exertion) 07/17/2014  . Acute on chronic renal insufficiency- ACE and diuretics  held 07/17/2014  . Diastolic dysfunction- moderate on echo 2013 07/17/2014  . Aortic stenosis, mild by echo 2013 07/17/2014  . LBBB (left bundle branch block) 07/17/2014  . Chest pain 07/17/2014  . Unstable angina ([place]) 07/16/2014  . CKD (chronic kidney disease) stage 4, GFR 15-29 ml/min (HCC) 12/03/2013  . Squamous cell cancer of skin of hand 05/31/2013  . HEAD TRAUMA, CLOSED 09/24/2010  . Ischemic cardiomyopathy- last EF Normal  Myoview Jan 2015 04/02/2010  . INSOMNIA 04/02/2010  . Moderate bilateral ICA disease by doppler June 2015 01/08/2010  . DYSPHAGIA UNSPECIFIED 09/05/2009  . Hx GERD/PUD 09/03/2009  . CHOLELITHIASIS, WITH CHOLECYSTITIS 09/03/2009  . ARTHRITIS 07/16/2009  . HYPOKALEMIA 11/06/2008  . CAD in native artery 11/06/2008  . 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 ([place]) 11/23/2007  . Dyslipidemia 11/23/2007  . ROSACEA 11/23/2007  . DYSPNEA ON EXERTION 11/23/2007  . UNS ADVRS EFF OTH RX MEDICINAL&BIOLOGICAL SBSTNC 11/23/2007  . HTN (hypertension) 11/22/2007  . Hx of Prostate ca 03/01/2004    Past Surgical History:  Procedure Laterality Date  . APPENDECTOMY    . BACK SURGERY    . CARDIAC CATHETERIZATION N/A 04/01/2015   Procedure: Left Heart Cath and Cors/Grafts Angiography;  Surgeon: Jettie Booze, MD;  Location: [facility] CV LAB;  Service: Cardiovascular;  Laterality: N/A;  . CARDIAC CATHETERIZATION N/A 04/01/2015   Procedure: Coronary Stent Intervention;  Surgeon: Jettie Booze, MD;  Location: [place] CV LAB;  Service: Cardiovascular;  Laterality: N/A;  . CARDIOVERSION N/A 11/01/2019   Procedure: CARDIOVERSION;  Surgeon: Geralynn Rile, MD;  Location: [facility];  Service: Cardiovascular;  Laterality: N/A;  . CARPAL TUNNEL RELEASE  2004   both hands  . CORONARY ARTERY BYPASS GRAFT    . CYSTOSCOPY  08/23/12   incomplete emoptying bladder  . HERNIA REPAIR    . incision and drainage of right chest abscess    . LAPAROSCOPIC CHOLECYSTECTOMY  09/2009  . LEFT HEART CATHETERIZATION WITH CORONARY ANGIOGRAM N/A 07/19/2014   Procedure: LEFT HEART CATHETERIZATION WITH CORONARY ANGIOGRAM;  Surgeon: Leonie Man, MD;  [facility];  Service: Cardiovascular;  Laterality: N/A;  . RECTAL SURGERY    . ROBOT ASSISTED LAPAROSCOPIC RADICAL PROSTATECTOMY  2005       Family History  Problem Relation Age of Onset  . Cancer Mother        stomach     Social History   Tobacco Use  . Smoking status: Former Smoker    Packs/day: 0.50    Years: 5.00    Pack years: 2.50    Types: Cigarettes    Quit date: 07/20/1972    Years since quitting: 48.2  . Smokeless tobacco: Never Used  . Tobacco comment: quit s  Vaping Use  . Vaping Use: Never used  Substance Use Topics  . Alcohol use: No  . Drug use: No    Comment: quit smoking 40 years ago    Home Medications Prior to Admission medications   Medication Sig Start Date End Date Taking? Authorizing Provider  acetaminophen (TYLENOL) 500 MG tablet Take 1,000 mg by mouth every 4 (four) hours as needed for fever or headache (pain).   Yes [provider]  albuterol (PROVENTIL) (2.5 MG/3ML) 0.083% nebulizer solution Take 3 mLs (2.5 mg total) by nebulization every 4 (four) hours as needed for wheezing or shortness of breath. 07/28/19  Yes Ivor Costa, MD  amLODipine (NORVASC) 5 MG tablet Take 1 tablet (5 mg total)  by mouth daily. 07/30/20  Yes Burchette, Alinda Sierras, MD  aspirin EC 81 MG tablet Take 81 mg by mouth daily. Swallow whole.   Yes [provider]  blood glucose meter kit and supplies KIT Dispense based on patient and insurance preference. Use up to four times daily as directed. (FOR ICD-9 250.00, 250.01). 09/08/20  Yes Kamineni, Lamount Cranker, MD  carvedilol (COREG) 6.25 MG tablet TAKE 1 TABLET (6.25 MG TOTAL) BY MOUTH 2 (TWO) TIMES DAILY WITH A MEAL. 08/27/20  Yes [name], MD  cefdinir (OMNICEF) 300 MG capsule Take 1 capsule (300 mg total) by mouth daily. 09/09/20  Yes Guilford Shi, MD  clopidogrel (PLAVIX) 75 MG tablet TAKE 1 TABLET (75 MG TOTAL) BY MOUTH DAILY. Patient taking differently: Take 75 mg by mouth daily.  03/14/20  Yes Burchette, Alinda Sierras, MD  docusate sodium (STOOL SOFTENER) 100 MG capsule Take 1 capsule (100 mg total) by mouth 2 (two) times daily as needed for mild constipation. Patient taking differently: Take 100 mg by mouth at bedtime.  07/28/19   Yes Ivor Costa, MD  ELIQUIS 2.5 MG TABS tablet TAKE 1 TABLET BY MOUTH TWICE A DAY Patient taking differently: Take 2.5 mg by mouth 2 (two) times daily.  08/27/20  Yes Burchette, Alinda Sierras, MD  furosemide (LASIX) 40 MG tablet Take 1 tablet (40 mg total) by mouth 2 (two) times daily. 07/30/20  Yes Burchette, Alinda Sierras, MD  guaiFENesin-dextromethorphan (ROBITUSSIN DM) 100-10 MG/5ML syrup Take 5 mLs by mouth every 6 (six) hours as needed for cough. 09/08/20  Yes Guilford Shi, MD  hydrocortisone 2.5 % cream Apply 1 application topically daily as needed (facial breakouts).   Yes [provider]  Multiple Vitamins-Minerals (ICAPS AREDS 2 PO) Take 1 capsule by mouth daily.   Yes [provider]  nitroGLYCERIN (NITROSTAT) 0.4 MG SL tablet Place 1 tablet (0.4 mg total) under the tongue every 5 (five) minutes x 3 doses as needed for chest pain (if no relief after 3rd dose, proceed to the ED for an evaluation). 04/17/18  Yes [name], MD  pantoprazole (PROTONIX) 40 MG tablet TAKE 1 TABLET BY MOUTH EVERY DAY Patient taking differently: Take 40 mg by mouth daily.  07/22/20  Yes Burchette, Alinda Sierras, MD  rosuvastatin (CRESTOR) 20 MG tablet TAKE 1 TABLET BY MOUTH EVERY DAY Patient taking differently: Take 20 mg by mouth daily.  09/08/20  Yes Burchette, Alinda Sierras, MD  sitaGLIPtin (JANUVIA) 50 MG tablet Take 1 tablet (50 mg total) by mouth daily. 03/10/20  Yes Burchette, Alinda Sierras, MD  spironolactone (ALDACTONE) 25 MG tablet TAKE 1 TABLET BY MOUTH EVERY DAY Patient taking differently: Take 25 mg by mouth daily.  05/12/20  Yes [name], MD  albuterol (VENTOLIN HFA) 108 (90 Base) MCG/ACT inhaler Inhale 2 puffs into the lungs every 6 (six) hours as needed. Patient not taking: Reported on 09/20/2020 08/12/20   Spero Geralds, MD  budesonide-formoterol [facility]) 160-4.5 MCG/ACT inhaler Inhale 2 puffs into the lungs in the morning and at bedtime. Patient not taking: Reported on 09/20/2020 08/07/20    Spero Geralds, MD    Allergies    Codeine and Morphine  Review of Systems   Review of Systems  Constitutional: Negative for chills and fever.  HENT: Negative for ear pain and sore throat.   Eyes: Negative for pain and visual disturbance.  Respiratory: Positive for shortness of breath. Negative for cough.   Cardiovascular: Positive for chest pain. Negative for palpitations.  Gastrointestinal:  Negative for abdominal pain and vomiting.  Genitourinary: Negative for dysuria and hematuria.  Musculoskeletal: Negative for arthralgias and back pain.  Skin: Negative for color change and rash.  Neurological: Negative for seizures and syncope.  All other systems reviewed and are negative.   Physical Exam Updated Vital Signs BP 106/62   Pulse (!) 58   Temp 98.1 F (36.7 C) (Oral)   Resp (!) 24   Ht _0  (1.753 m)   Wt 81.6 kg   SpO2 94%   BMI 26.58 kg/m   Physical Exam Vitals and nursing note reviewed.  Constitutional:      Appearance: He is well-developed.  HENT:     Head: Normocephalic and atraumatic.  Eyes:     Conjunctiva/sclera: Conjunctivae normal.  Cardiovascular:     Rate and Rhythm: Normal rate and regular rhythm.     Heart sounds: No murmur heard.   Pulmonary:     Effort: Pulmonary effort is normal. No respiratory distress.     Breath sounds: Normal breath sounds.  Abdominal:     Palpations: Abdomen is soft.     Tenderness: There is no abdominal tenderness.  Musculoskeletal:     Cervical back: Neck supple.  Skin:    General: Skin is warm and dry.  Neurological:     Mental Status: He is alert.     ED Results / Procedures / Treatments   Labs (all labs ordered are listed, but only abnormal results are displayed) Labs Reviewed  BASIC METABOLIC PANEL - Abnormal; Notable for the following components:      Result Value   Sodium 131 (*)    Chloride 94 (*)    Glucose, Bld 258 (*)    Creatinine, Ser 1.79 (*)    GFR, Estimated 37 (*)    All other  components within normal limits  CBC - Abnormal; Notable for the following components:   WBC 18.5 (*)    All other components within normal limits  BRAIN NATRIURETIC PEPTIDE - Abnormal; Notable for the following components:   B Natriuretic Peptide 685.4 (*)    All other components within normal limits  D-DIMER, QUANTITATIVE (NOT AT [facility]) - Abnormal; Notable for the following components:   D-Dimer, Quant 0.76 (*)    All other components within normal limits  TROPONIN I (HIGH SENSITIVITY) - Abnormal; Notable for the following components:   Troponin I (High Sensitivity) 67 (*)    All other components within normal limits  TROPONIN I (HIGH SENSITIVITY) - Abnormal; Notable for the following components:   Troponin I (High Sensitivity) 103 (*)    All other components within normal limits  RESP PANEL BY RT-PCR (FLU A&B, COVID) ARPGX2  CULTURE, BLOOD (ROUTINE X 2)  CULTURE, BLOOD (ROUTINE X 2)  PROCALCITONIN  SEDIMENTATION RATE  URINALYSIS, ROUTINE W REFLEX MICROSCOPIC  CBC    EKG EKG Interpretation  Date/Time:  Saturday September 20 2020 08:30:43 EST Ventricular Rate:  110 PR Interval:    QRS Duration: 146 QT Interval:  362 QTC Calculation: 489 R Axis:   94 Text Interpretation: Atrial fibrillation with rapid ventricular response Rightward axis Left bundle branch block Abnormal ECG LBBB Confirmed by Madalyn Rob [phone]) on 09/20/2020 10:07:34 AM   Radiology DG Chest 2 View  Result Date: 09/20/2020 CLINICAL DATA:  Chest pain, shortness of breath EXAM: CHEST - 2 VIEW COMPARISON:  Chest x-rays dated 09/07/2020 in 04/02/2020 FINDINGS: Borderline cardiomegaly, stable. Median sternotomy wires appear intact and stable in alignment. Lungs are clear. No  pleural effusion or pneumothorax is seen. No acute appearing osseous abnormality. IMPRESSION: No active cardiopulmonary disease. No evidence of pneumonia or pulmonary edema. Electronically Signed   By: Franki Cabot M.D.   On: 09/20/2020  09:11    Procedures .Critical Care Performed by: Lucrezia Starch, MD Authorized by: Lucrezia Starch, MD   Critical care provider statement:    Critical care time (minutes):  35   Critical care was necessary to treat or prevent imminent or life-threatening deterioration of the following conditions:  Cardiac failure and respiratory failure   Critical care was time spent personally by me on the following activities:  Discussions with consultants, evaluation of patient's response to treatment, examination of patient, ordering and performing treatments and interventions, ordering and review of laboratory studies, ordering and review of radiographic studies, pulse oximetry, re-evaluation of patient's condition, obtaining history from patient or surrogate and review of old charts   (including critical care time)  Medications Ordered in ED Medications  fentaNYL (SUBLIMAZE) injection 50 mcg (has no administration in time range)  nitroGLYCERIN (NITROSTAT) SL tablet 0.4 mg (has no administration in time range)  fluticasone furoate-vilanterol (BREO ELLIPTA) 200-25 MCG/INH 1 puff (has no administration in time range)  ipratropium (ATROVENT) nebulizer solution 0.5 mg (has no administration in time range)  acetaminophen (TYLENOL) tablet 1,000 mg (has no administration in time range)  aspirin EC tablet 81 mg (has no administration in time range)  amLODipine (NORVASC) tablet 5 mg (has no administration in time range)  carvedilol (COREG) tablet 6.25 mg (has no administration in time range)  furosemide (LASIX) tablet 40 mg (has no administration in time range)  rosuvastatin (CRESTOR) tablet 20 mg (has no administration in time range)  spironolactone (ALDACTONE) tablet 25 mg (has no administration in time range)  linagliptin (TRADJENTA) tablet 5 mg (has no administration in time range)  docusate sodium (COLACE) capsule 100 mg (has no administration in time range)  pantoprazole (PROTONIX) EC tablet 40  mg (has no administration in time range)  clopidogrel (PLAVIX) tablet 75 mg (has no administration in time range)  apixaban (ELIQUIS) tablet 2.5 mg (has no administration in time range)  guaiFENesin-dextromethorphan (ROBITUSSIN DM) 100-10 MG/5ML syrup 5 mL (has no administration in time range)  hydrocortisone cream 1 % 1 application (has no administration in time range)  sodium chloride flush (NS) 0.9 % injection 3 mL (has no administration in time range)  sodium chloride flush (NS) 0.9 % injection 3 mL (has no administration in time range)  0.9 %  sodium chloride infusion (has no administration in time range)  ondansetron (ZOFRAN) injection 4 mg (has no administration in time range)  insulin aspart (novoLOG) injection 0-9 Units (has no administration in time range)  nitroGLYCERIN (NITROSTAT) SL tablet 0.4 mg (0.4 mg Sublingual Given 09/20/20 1210)    ED Course  I have reviewed the triage vital signs and the nursing notes.  Pertinent labs & imaging results that were available during my care of the patient were reviewed by me and considered in my medical decision making (see chart for details).  Clinical Course as of Sep 20 1601  Sat Sep 20, 2020  [address] [place], will review with cardiology  Troponin I (High Sensitivity) [RD]  1336 Discussed case with Dr. De Nurse ER, she will see in formal consultation, multiple possibilities (CAD, AMS, heart failure, etc.), recommends medicine admission, further recs regarding any card procedures, diures pending her evaluation   [RD]    Clinical Course User Index [RD]  Lucrezia Starch, MD   MDM Rules/Calculators/A&P                         86 year old male presents to ER with concern for chest pain, shortness of breath.  Extensive medical history paroxysmal A. fib, CAD, aortic stenosis, COPD.  On exam today patient is not in distress with stable vitals.  No wheezing to suggest COPD.  No edema noted on exam or on chest x-ray though BNP was elevated.   Chest pain improved after receiving nitroglycerin.  Basic labs were grossly stable.  D-dimer age-adjusted was within normal limits.  Is on anticoagulation for A. Fib and reports compliance.  His EKG does not have obvious ischemic change but his troponin was mildly elevated.  Repeat troponin was further increased.  Given his medical history, concern for cardiac etiology.  Consulted cardiology who reports their team will formally consult and provide further recommendations on management.  Concern his symptoms may be related to his underlying coronary disease or aortic stenosis.  Cardiology request medicine admission for observation.  Dr. Roosevelt Locks will admit.   Final Clinical Impression(s) / ED Diagnoses Final diagnoses:  Chest pain, unspecified type  Coronary artery disease involving native heart with angina pectoris, unspecified vessel or lesion type ([place])  Aortic valve stenosis, etiology of cardiac valve disease unspecified  Elevated troponin    Rx / DC Orders ED Discharge Orders    None       Lucrezia Starch, MD 09/20/20 1606

## 2020-09-20 NOTE — H&P (Signed)
History and Physical    Shawn BRUMETT GTX:[id number] DOB: [birth year] DOA: 09/20/2020  PCP: [name], MD (Confirm with patient/family/NH records and if not entered, this has to be entered at [name] point of entry) Patient coming from: Home  I have personally briefly reviewed patient's old medical records in [name]  Chief Complaint: Fever, chest pain, SOB  HPI: [name] is a 86 y.o. male with medical history significant of CAD with CABG x4, stenting x5 with and stenting stenosis, CKD stage III, chronic diastolic CHF, HTN, paroxysmal A. fib on Eliquis, IIDM, COPD, presented with worsening of shortness of breath, fever and chest pain.  Patient was hospitalized 2 weeks ago for COPD exacerbation.  Last mission, patient had low-grade fever.  After treatment with systemic steroid, bronchodilator and antibiotics, patient was discharged home.  Patient did not tolerate steroid in the hospital so steroid was not ordered for discharge.  Patient was supposed to take 5 more days of Omnicef, but he did not tolerate after day 1, so he stopped taking on his own.  After that, patient has developed increasing shortness of breath, initially was exertional become more frequent and constant.    For last 2 days, patient had low-grade fever, the day before yesterday he had subjective fever but did not measure but last night he spiked fever 102.  Then he took some Tylenol and went to sleep, around 2:00 this morning, patient woke up with severe pressure-like chest pain and palpitations and severe shortness of breath. Suspecting A. fib comes back, he checked his pulses which was regular and not fast.  Took 1 sublingual nitro and the pain subsided in about 30 minutes, but no later chest pain came back again with associated shortness of breath and palpitations, again he took 1 sublingual nitro and pain subsided in 30 minutes.  And early this morning around 5 had another such episode.  Patient denied any cough, no  dysuria no diarrhea. ED Course: Low-grade fever 100.2, leukocytosis 18.5.  Troponin 67> 103, chest x-ray lungs are clear.  Review of Systems: As per HPI otherwise 14 point review of systems negative.    Past Medical History:  Diagnosis Date  . Arthritis   . CAD (coronary artery disease)    a. Cath September 2015 LIMA to the LAD patent, SVG to PDA patent, SVG to posterior lateral patent, SVG to OM with a 90% in-stent restenosis at an anastomotic lesion. This was treated with angioplasty. b. cath 04/01/2015 95% ISR in SVG to OM treated with 2.75x24 Synergy DES postdilated to 3.40mm, all other grafts patent  . Cataract    surgery,B/L  . CHF (congestive heart failure) ([place])   . Chronic kidney disease    nephrolithiasis  . Diabetes mellitus    TYPE 2  . GERD (gastroesophageal reflux disease)   . Heart murmur   . Hernia   . Hypercholesterolemia   . Hypertension   . Incontinence    hx  over 1 year,leaks without awareness  . Other and unspecified diseases of appendix   . PAF (paroxysmal atrial fibrillation) ([name])    in the setting of ischemia on 03/31/2015  . Pancreatitis   . Prostate CA ([name]) 03/01/04   prostate bx=Adenocarcinoam,gleason 3+4=7,PSA=6.75  . Shortness of breath dyspnea   . Ulcer    hx gastric    Past Surgical History:  Procedure Laterality Date  . APPENDECTOMY    . BACK SURGERY    . CARDIAC CATHETERIZATION N/A 04/01/2015  Procedure: Left Heart Cath and Cors/Grafts Angiography;  Surgeon: Shawn Booze, MD;  Location: [place] [facility];  Service: Cardiovascular;  Laterality: N/A;  . CARDIAC CATHETERIZATION N/A 04/01/2015   Procedure: Coronary Stent Intervention;  Surgeon: Shawn Booze, MD;  Location: [place] [facility];  Service: Cardiovascular;  Laterality: N/A;  . CARDIOVERSION N/A 11/01/2019   Procedure: CARDIOVERSION;  Surgeon: Shawn Rile, MD;  Location: [name];  Service: Cardiovascular;  Laterality: N/A;  . CARPAL TUNNEL RELEASE  2004    both hands  . CORONARY ARTERY BYPASS GRAFT    . CYSTOSCOPY  08/23/12   incomplete emoptying bladder  . HERNIA REPAIR    . incision and drainage of right chest abscess    . LAPAROSCOPIC CHOLECYSTECTOMY  09/2009  . LEFT HEART CATHETERIZATION WITH CORONARY ANGIOGRAM N/A 07/19/2014   Procedure: LEFT HEART CATHETERIZATION WITH CORONARY ANGIOGRAM;  Surgeon: Shawn Man, MD;  [name];  Service: Cardiovascular;  Laterality: N/A;  . RECTAL SURGERY    . ROBOT ASSISTED LAPAROSCOPIC RADICAL PROSTATECTOMY  2005     reports that he quit smoking about 48 years ago. His smoking use included cigarettes. He has a 2.50 pack-year smoking history. He has never used smokeless tobacco. He reports that he does not drink alcohol and does not use drugs.  Allergies  Allergen Reactions  . Codeine Other (See Comments)    Makes him feel goofy  . Morphine Other (See Comments)    Nightmares and felt bad    Family History  Problem Relation Age of Onset  . Cancer Mother        stomach     Prior to Admission medications   Medication Sig Start Date End Date Taking? Authorizing Provider  acetaminophen (TYLENOL) 500 MG tablet Take 1,000 mg by mouth every 4 (four) hours as needed for fever or headache (pain).   Yes [provider]  albuterol (PROVENTIL) (2.5 MG/3ML) 0.083% nebulizer solution Take 3 mLs (2.5 mg total) by nebulization every 4 (four) hours as needed for wheezing or shortness of breath. 07/28/19  Yes Ivor Costa, MD  amLODipine (NORVASC) 5 MG tablet Take 1 tablet (5 mg total) by mouth daily. 07/30/20  Yes Burchette, Alinda Sierras, MD  aspirin EC 81 MG tablet Take 81 mg by mouth daily. Swallow whole.   Yes [provider]  blood glucose meter kit and supplies KIT Dispense based on patient and insurance preference. Use up to four times daily as directed. (FOR ICD-9 250.00, 250.01). 09/08/20  Yes Kamineni, Lamount Cranker, MD  carvedilol (COREG) 6.25 MG tablet TAKE 1 TABLET (6.25 MG  TOTAL) BY MOUTH 2 (TWO) TIMES DAILY WITH A MEAL. 08/27/20  Yes [name], MD  cefdinir (OMNICEF) 300 MG capsule Take 1 capsule (300 mg total) by mouth daily. 09/09/20  Yes Guilford Shi, MD  clopidogrel (PLAVIX) 75 MG tablet TAKE 1 TABLET (75 MG TOTAL) BY MOUTH DAILY. Patient taking differently: Take 75 mg by mouth daily.  03/14/20  Yes Burchette, Alinda Sierras, MD  docusate sodium (STOOL SOFTENER) 100 MG capsule Take 1 capsule (100 mg total) by mouth 2 (two) times daily as needed for mild constipation. Patient taking differently: Take 100 mg by mouth at bedtime.  07/28/19  Yes Ivor Costa, MD  ELIQUIS 2.5 MG TABS tablet TAKE 1 TABLET BY MOUTH TWICE A DAY Patient taking differently: Take 2.5 mg by mouth 2 (two) times daily.  08/27/20  Yes Burchette, Alinda Sierras, MD  furosemide (LASIX) 40 MG  tablet Take 1 tablet (40 mg total) by mouth 2 (two) times daily. 07/30/20  Yes Burchette, Alinda Sierras, MD  guaiFENesin-dextromethorphan (ROBITUSSIN DM) 100-10 MG/5ML syrup Take 5 mLs by mouth every 6 (six) hours as needed for cough. 09/08/20  Yes Guilford Shi, MD  hydrocortisone 2.5 % cream Apply 1 application topically daily as needed (facial breakouts).   Yes [provider]  Multiple Vitamins-Minerals (ICAPS AREDS 2 PO) Take 1 capsule by mouth daily.   Yes [provider]  nitroGLYCERIN (NITROSTAT) 0.4 MG SL tablet Place 1 tablet (0.4 mg total) under the tongue every 5 (five) minutes x 3 doses as needed for chest pain (if no relief after 3rd dose, proceed to the ED for an evaluation). 04/17/18  Yes [name], MD  pantoprazole (PROTONIX) 40 MG tablet TAKE 1 TABLET BY MOUTH EVERY DAY Patient taking differently: Take 40 mg by mouth daily.  07/22/20  Yes Burchette, Alinda Sierras, MD  rosuvastatin (CRESTOR) 20 MG tablet TAKE 1 TABLET BY MOUTH EVERY DAY Patient taking differently: Take 20 mg by mouth daily.  09/08/20  Yes Burchette, Alinda Sierras, MD  sitaGLIPtin (JANUVIA) 50 MG tablet Take 1 tablet (50 mg  total) by mouth daily. 03/10/20  Yes Burchette, Alinda Sierras, MD  spironolactone (ALDACTONE) 25 MG tablet TAKE 1 TABLET BY MOUTH EVERY DAY Patient taking differently: Take 25 mg by mouth daily.  05/12/20  Yes [name], MD  albuterol (VENTOLIN HFA) 108 (90 Base) MCG/ACT inhaler Inhale 2 puffs into the lungs every 6 (six) hours as needed. Patient not taking: Reported on 09/20/2020 08/12/20   Spero Geralds, MD  budesonide-formoterol [name]) 160-4.5 MCG/ACT inhaler Inhale 2 puffs into the lungs in the morning and at bedtime. Patient not taking: Reported on 09/20/2020 08/07/20   Spero Geralds, MD    Physical Exam: Vitals:   09/20/20 1215 09/20/20 1350 09/20/20 1503 09/20/20 1512  BP: 117/74 125/80  (!) 102/50  Pulse: 98 87  85  Resp: (!) 24 (!) 26  (!) 24  Temp:      TempSrc:      SpO2: 94% 94%  94%  Weight:   81.6 kg   Height:   '5\' 9"'$  (1.753 m)     Constitutional: NAD, calm, comfortable Vitals:   09/20/20 1215 09/20/20 1350 09/20/20 1503 09/20/20 1512  BP: 117/74 125/80  (!) 102/50  Pulse: 98 87  85  Resp: (!) 24 (!) 26  (!) 24  Temp:      TempSrc:      SpO2: 94% 94%  94%  Weight:   81.6 kg   Height:   '5\' 9"'$  (1.753 m)    Eyes: PERRL, lids and conjunctivae normal ENMT: Mucous membranes are moist. Posterior pharynx clear of any exudate or lesions.Normal dentition.  Neck: normal, supple, no masses, no thyromegaly Respiratory: clear to auscultation bilaterally, fine crackles on bilateral bases no wheezing. Normal respiratory effort. No accessory muscle use.  Cardiovascular: Regular rate and rhythm, systolic murmur heart base. No extremity edema. 2+ pedal pulses. No carotid bruits.  Abdomen: no tenderness, no masses palpated. No hepatosplenomegaly. Bowel sounds positive.  Musculoskeletal: no clubbing / cyanosis. No joint deformity upper and lower extremities. Good ROM, no contractures. Normal muscle tone.  Skin: no rashes, lesions, ulcers. No induration Neurologic: CN 2-12  grossly intact. Sensation intact, DTR normal. Strength 5/5 in all 4.  Psychiatric: Normal judgment and insight. Alert and oriented x 3. Normal mood.     Labs on Admission: I have  personally reviewed following labs and imaging studies  CBC: Recent Labs  Lab 09/20/20 0843  WBC 18.5*  HGB 14.2  HCT 42.6  MCV 89.9  PLT 993   Basic Metabolic Panel: Recent Labs  Lab 09/15/20 1140 09/20/20 0843  NA 135 131*  K 4.0 4.3  CL 97 94*  CO2 22 24  GLUCOSE 196* 258*  BUN 25 20  CREATININE 1.84* 1.79*  CALCIUM 9.2 9.8   GFR: Estimated Creatinine Clearance: 30.2 mL/min (A) (by C-G formula based on SCr of 1.79 mg/dL (H)). Liver Function Tests: No results for input(s): AST, ALT, ALKPHOS, BILITOT, PROT, ALBUMIN in the last 168 hours. No results for input(s): LIPASE, AMYLASE in the last 168 hours. No results for input(s): AMMONIA in the last 168 hours. Coagulation Profile: No results for input(s): INR, PROTIME in the last 168 hours. Cardiac Enzymes: No results for input(s): CKTOTAL, CKMB, CKMBINDEX, TROPONINI in the last 168 hours. BNP (last 3 results) Recent Labs    04/02/20 1053  PROBNP 359.0*   HbA1C: No results for input(s): HGBA1C in the last 72 hours. CBG: No results for input(s): GLUCAP in the last 168 hours. Lipid Profile: No results for input(s): CHOL, HDL, LDLCALC, TRIG, CHOLHDL, LDLDIRECT in the last 72 hours. Thyroid Function Tests: No results for input(s): TSH, T4TOTAL, FREET4, T3FREE, THYROIDAB in the last 72 hours. Anemia Panel: No results for input(s): VITAMINB12, FOLATE, FERRITIN, TIBC, IRON, RETICCTPCT in the last 72 hours. Urine analysis:    Component Value Date/Time   COLORURINE YELLOW 07/26/2019 1111   APPEARANCEUR CLEAR 07/26/2019 1111   LABSPEC 1.009 07/26/2019 1111   PHURINE 6.0 07/26/2019 1111   GLUCOSEU NEGATIVE 07/26/2019 1111   HGBUR NEGATIVE 07/26/2019 1111   HGBUR trace-intact 11/23/2007 0000   BILIRUBINUR NEGATIVE 07/26/2019 1111    KETONESUR NEGATIVE 07/26/2019 1111   PROTEINUR 30 (A) 07/26/2019 1111   UROBILINOGEN 0.2 02/16/2011 2229   NITRITE NEGATIVE 07/26/2019 1111   LEUKOCYTESUR NEGATIVE 07/26/2019 1111    Radiological Exams on Admission: DG Chest 2 View  Result Date: 09/20/2020 CLINICAL DATA:  Chest pain, shortness of breath EXAM: CHEST - 2 VIEW COMPARISON:  Chest x-rays dated 09/07/2020 in 04/02/2020 FINDINGS: Borderline cardiomegaly, stable. Median sternotomy wires appear intact and stable in alignment. Lungs are clear. No pleural effusion or pneumothorax is seen. No acute appearing osseous abnormality. IMPRESSION: No active cardiopulmonary disease. No evidence of pneumonia or pulmonary edema. Electronically Signed   By: Franki Cabot M.D.   On: 09/20/2020 09:11    EKG: Independently reviewed. LVH  Assessment/Plan Active Problems:   Angina at rest [facility])   CHF (congestive heart failure) ([place])  (please populate well all problems here in Problem List. (For example, if patient is on BP meds at home and you resume or decide to hold them, it is a problem that needs to be her. Same for CAD, COPD, HLD and so on)  Angina -Likely related to moderate to severe aortic stenosis identified on the recent echo on last admission. -Discussed with on-call cardiologist, who is coming to see patient in the ER.  Acute on chronic diastolic CHF decompensation -Has some subtle signs of fluid overload on lung exam, but given history of severe aortic stenosis, will try to maintain him on his home dose of Lasix, and not escalating diuresis for now, as patient is no overtly hypoxic. -Given patient had no signs of angina and CHF, implying poor prognosis of aortic stenosis.  Discussed the prognosis with patient and his wife at  bedside.  Fever -Low-grade fever on last admission lower 100s -Subjective fever the day before yesterday and fever of 102 last night at home.  With worsening of leukocytosis.  Source of infection is unclear, no  pneumonia, UA pending no diarrhea. -Send procalcitonin, ESR. -Blood culture x2 (blood culture negative x2 last mission) -Discussed with on-call cardiologist, who reviewed TTE from last admission, no significant vegetation on mitral valve or aortic valve. -Plan to monitor off antibiotics for now, but keep low threshold to send another set of blood culture and start antibiotics if patient start fever and escalate endocarditis work-up.  CAD -Suspect angina likely from aortic stenosis, but echo on admission showed mid inferior wall mobility abnormalities probably reflect worsening of CAD.  LBBB -Cardiac resynchronization?  COPD -No symptoms signs of acute exacerbation, change albuterol to Atrovent  Paroxysmal A. Fib -Telemetry monitoring, continue Eliquis  HTN -Continue amlodipine and Coreg  CKD stage III -Creatinine level stable, mild fluid overload, continue Lasix regimen  IIDM with hyperglycemia -Continue Januvia, add sliding scale.    DVT prophylaxis: Eliquis Code Status: Full code Family Communication: Wife at bedside Disposition Plan: Complicated medical conditions with decompensated CHF and aortic stenosis s/p fever, need more than 2 midnight hospital stay Consults called: Cardiology Admission status: Telemetry admission   Lequita Halt MD [facility] Pager [phone]  09/20/2020, 3:16 PM

## 2020-09-20 NOTE — Plan of Care (Signed)
  Problem: Education: Goal: Understanding of cardiac disease, CV risk reduction, and recovery process will improve Outcome: Progressing   Problem: Activity: Goal: Ability to tolerate increased activity will improve Outcome: Progressing   Problem: Cardiac: Goal: Ability to achieve and maintain adequate cardiovascular perfusion will improve Outcome: Progressing   

## 2020-09-20 NOTE — Consult Note (Addendum)
Cardiology Consultation:   Patient ID: [name]; [id number]; 1932/11/01   Admit date: 09/20/2020 Date of Consult: 09/20/2020  Primary Care Provider: [name], MD Primary Cardiologist: [name], MD 09/08/2020 in-hospital Primary Electrophysiologist:  None   Patient Profile:   [name] is a 86 y.o. male with a hx of  CAD/CABG, PAF, DM2, HTN, HLD, CKD, and chronic systolic and diastolic heart failure w/ mild AS, who is being seen today for the evaluation of chest pain at the request of Dr Roosevelt Locks.  History of Present Illness:   [name] had 3/4 patent grafts 2016 w/ DES SVG-OM. Afib has been recurrent despite DCCV, rate control.  Admitted 11/21-11/22/2021 w/ CHF exacerbation and COPD. Sx improved after IV Lasix and steroids. Pt has appt 12/09 w/ Almyra Deforest, PAC.   [name] has not been tracking his weight.  He and his wife say that he has been eating poorly, they feel he has been losing weight.  He denies lower extremity edema.  However, in the last few days, he describes orthopnea and PND.  He has been having trouble sleeping for a long time.  He wakes up in the night and it takes him several hours to go back to sleep.  He did not like the way melatonin made him feel and he has been told that Tylenol PM will give you Alzheimer's.  Last night, he believes that he had a fever.  He finally took his temperature later on was 102.  He took some Tylenol.  He woke with severe chest pain he describes as a pressure, 12/10.  He felt his heart pounding, but does not know how fast it was going.  He felt very short of breath with this.  He took a sublingual nitroglycerin, but it did not help very much.  The chest pain eventually resolved when he was trying to go back to sleep but the chest pain came back again, again with shortness of breath and palpitations.  He does not feel like he was able to sleep at all last night.  He had another episode about 5 AM, and that is when he came to the  emergency room.  In the emergency room, he initially had a low-grade temperature that has resolved.  His O2 sats are good on room air and his chest x-ray does not show anything acute.  Currently, he is resting comfortably     Past Medical History:  Diagnosis Date  . Arthritis   . CAD (coronary artery disease)    a. Cath September 2015 LIMA to the LAD patent, SVG to PDA patent, SVG to posterior lateral patent, SVG to OM with a 90% in-stent restenosis at an anastomotic lesion. This was treated with angioplasty. b. cath 04/01/2015 95% ISR in SVG to OM treated with 2.75x24 Synergy DES postdilated to 3.49mm, all other grafts patent  . Cataract    surgery,B/L  . CHF (congestive heart failure) ([place])   . Chronic kidney disease    nephrolithiasis  . Diabetes mellitus    TYPE 2  . GERD (gastroesophageal reflux disease)   . Heart murmur   . Hernia   . Hypercholesterolemia   . Hypertension   . Incontinence    hx  over 1 year,leaks without awareness  . Other and unspecified diseases of appendix   . PAF (paroxysmal atrial fibrillation) ([place])    in the setting of ischemia on 03/31/2015  . Pancreatitis   . Prostate CA ([place]) 03/01/04  prostate bx=Adenocarcinoam,gleason 3+4=7,PSA=6.75  . Shortness of breath dyspnea   . Ulcer    hx gastric    Past Surgical History:  Procedure Laterality Date  . APPENDECTOMY    . BACK SURGERY    . CARDIAC CATHETERIZATION N/A 04/01/2015   Procedure: Left Heart Cath and Cors/Grafts Angiography;  Surgeon: Jettie Booze, MD;  Location: [facility];  Service: Cardiovascular;  Laterality: N/A;  . CARDIAC CATHETERIZATION N/A 04/01/2015   Procedure: Coronary Stent Intervention;  Surgeon: Jettie Booze, MD;  Location: [facility];  Service: Cardiovascular;  Laterality: N/A;  . CARDIOVERSION N/A 11/01/2019   Procedure: CARDIOVERSION;  Surgeon: Geralynn Rile, MD;  Location: [facility];  Service: Cardiovascular;  Laterality: N/A;  .  CARPAL TUNNEL RELEASE  2004   both hands  . CORONARY ARTERY BYPASS GRAFT    . CYSTOSCOPY  08/23/12   incomplete emoptying bladder  . HERNIA REPAIR    . incision and drainage of right chest abscess    . LAPAROSCOPIC CHOLECYSTECTOMY  09/2009  . LEFT HEART CATHETERIZATION WITH CORONARY ANGIOGRAM N/A 07/19/2014   Procedure: LEFT HEART CATHETERIZATION WITH CORONARY ANGIOGRAM;  Surgeon: Leonie Man, MD;  Location[facility] C[facility];  Service: Cardiovascular;  Laterality: N/A;  . RECTAL SURGERY    . ROBOT ASSISTED LAPAROSCOPIC RADICAL PROSTATECTOMY  2005     Prior to Admission medications   Medication Sig Start Date End Date Taking? Authorizing Provider  acetaminophen (TYLENOL) 500 MG tablet Take 1,000 mg by mouth every 4 (four) hours as needed for fever or headache (pain).   Yes [provider]  albuterol (PROVENTIL) (2.5 MG/3ML) 0.083% nebulizer solution Take 3 mLs (2.5 mg total) by nebulization every 4 (four) hours as needed for wheezing or shortness of breath. 07/28/19  Yes Ivor Costa, MD  amLODipine (NORVASC) 5 MG tablet Take 1 tablet (5 mg total) by mouth daily. 07/30/20  Yes Burchette, Alinda Sierras, MD  aspirin EC 81 MG tablet Take 81 mg by mouth daily. Swallow whole.   Yes [provider]  blood glucose meter kit and supplies KIT Dispense based on patient and insurance preference. Use up to four times daily as directed. (FOR ICD-9 250.00, 250.01). 09/08/20  Yes Kamineni, Lamount Cranker, MD  carvedilol (COREG) 6.25 MG tablet TAKE 1 TABLET (6.25 MG TOTAL) BY MOUTH 2 (TWO) TIMES DAILY WITH A MEAL. 08/27/20  Yes [name], MD  cefdinir (OMNICEF) 300 MG capsule Take 1 capsule (300 mg total) by mouth daily. 09/09/20  Yes Guilford Shi, MD  clopidogrel (PLAVIX) 75 MG tablet TAKE 1 TABLET (75 MG TOTAL) BY MOUTH DAILY. Patient taking differently: Take 75 mg by mouth daily.  03/14/20  Yes Burchette, Alinda Sierras, MD  docusate sodium (STOOL SOFTENER) 100 MG capsule Take 1 capsule (100 mg total)  by mouth 2 (two) times daily as needed for mild constipation. Patient taking differently: Take 100 mg by mouth at bedtime.  07/28/19  Yes Ivor Costa, MD  ELIQUIS 2.5 MG TABS tablet TAKE 1 TABLET BY MOUTH TWICE A DAY Patient taking differently: Take 2.5 mg by mouth 2 (two) times daily.  08/27/20  Yes Burchette, Alinda Sierras, MD  furosemide (LASIX) 40 MG tablet Take 1 tablet (40 mg total) by mouth 2 (two) times daily. 07/30/20  Yes Burchette, Alinda Sierras, MD  guaiFENesin-dextromethorphan (ROBITUSSIN DM) 100-10 MG/5ML syrup Take 5 mLs by mouth every 6 (six) hours as needed for cough. 09/08/20  Yes Guilford Shi, MD  hydrocortisone 2.5 % cream  Apply 1 application topically daily as needed (facial breakouts).   Yes [provider]  Multiple Vitamins-Minerals (ICAPS AREDS 2 PO) Take 1 capsule by mouth daily.   Yes [provider]  nitroGLYCERIN (NITROSTAT) 0.4 MG SL tablet Place 1 tablet (0.4 mg total) under the tongue every 5 (five) minutes x 3 doses as needed for chest pain (if no relief after 3rd dose, proceed to the ED for an evaluation). 04/17/18  Yes [name], MD  pantoprazole (PROTONIX) 40 MG tablet TAKE 1 TABLET BY MOUTH EVERY DAY Patient taking differently: Take 40 mg by mouth daily.  07/22/20  Yes Burchette, Alinda Sierras, MD  rosuvastatin (CRESTOR) 20 MG tablet TAKE 1 TABLET BY MOUTH EVERY DAY Patient taking differently: Take 20 mg by mouth daily.  09/08/20  Yes Burchette, Alinda Sierras, MD  sitaGLIPtin (JANUVIA) 50 MG tablet Take 1 tablet (50 mg total) by mouth daily. 03/10/20  Yes Burchette, Alinda Sierras, MD  spironolactone (ALDACTONE) 25 MG tablet TAKE 1 TABLET BY MOUTH EVERY DAY Patient taking differently: Take 25 mg by mouth daily.  05/12/20  Yes [name], MD  albuterol (VENTOLIN HFA) 108 (90 Base) MCG/ACT inhaler Inhale 2 puffs into the lungs every 6 (six) hours as needed. Patient not taking: Reported on 09/20/2020 08/12/20   Spero Geralds, MD  budesonide-formoterol [facility])  160-4.5 MCG/ACT inhaler Inhale 2 puffs into the lungs in the morning and at bedtime. Patient not taking: Reported on 09/20/2020 08/07/20   Spero Geralds, MD    Inpatient Medications: Scheduled Meds:  Continuous Infusions:  PRN Meds: fentaNYL (SUBLIMAZE) injection  Allergies:    Allergies  Allergen Reactions  . Codeine Other (See Comments)    Makes him feel goofy  . Morphine Other (See Comments)    Nightmares and felt bad    Social History:   Social History   Socioeconomic History  . Marital status: Married    Spouse name: Not on file  . Number of children: 2  . Years of education: Not on file  . Highest education level: Not on file  Occupational History    Employer: RETIRED    Comment: Retired  Tobacco Use  . Smoking status: Former Smoker    Packs/day: 0.50    Years: 5.00    Pack years: 2.50    Types: Cigarettes    Quit date: 07/20/1972    Years since quitting: 48.2  . Smokeless tobacco: Never Used  . Tobacco comment: quit s  Vaping Use  . Vaping Use: Never used  Substance and Sexual Activity  . Alcohol use: No  . Drug use: No    Comment: quit smoking 40 years ago  . Sexual activity: Never  Other Topics Concern  . Not on file  Social History Narrative   Retired   Married      Social Determinants of Radio broadcast assistant Strain:   . Difficulty of Paying Living Expenses: Not on file  Food Insecurity:   . Worried About Charity fundraiser in the Last Year: Not on file  . Ran Out of Food in the Last Year: Not on file  Transportation Needs:   . Lack of Transportation (Medical): Not on file  . Lack of Transportation (Non-Medical): Not on file  Physical Activity:   . Days of Exercise per Week: Not on file  . Minutes of Exercise per Session: Not on file  Stress:   . Feeling of Stress : Not on file  Social Connections:   .  Frequency of Communication with Friends and Family: Not on file  . Frequency of Social Gatherings with Friends and Family:  Not on file  . Attends Religious Services: Not on file  . Active Member of Clubs or Organizations: Not on file  . Attends Archivist Meetings: Not on file  . Marital Status: Not on file  Intimate Partner Violence:   . Fear of Current or Ex-Partner: Not on file  . Emotionally Abused: Not on file  . Physically Abused: Not on file  . Sexually Abused: Not on file    Family History:   Family History  Problem Relation Age of Onset  . Cancer Mother        stomach   Family Status:  Family Status  Relation Name Status  . Mother  Deceased at age 63       cancer   . Father  Deceased at age 40       MI  . MGM  Deceased  . MGF  Deceased  . PGM  Deceased  . PGF  Deceased    ROS:  Please see the history of present illness.  All other ROS reviewed and negative.     Physical Exam/Data:   Vitals:   09/20/20 1037 09/20/20 1100 09/20/20 1145 09/20/20 1215  BP: 133/76 129/75 137/72 117/74  Pulse: 91 (!) 110 (!) 101 98  Resp: 18 20 (!) 24 (!) 24  Temp:   98.1 F (36.7 C)   TempSrc:   Oral   SpO2: 99% 98% 95% 94%   No intake or output data in the 24 hours ending 09/20/20 1354  Last 3 Weights 09/10/2020 09/08/2020 09/07/2020  Weight (lbs) 180 lb 6.4 oz 175 lb 7.8 oz 175 lb  Weight (kg) 81.829 kg 79.6 kg 79.379 kg     There is no height or weight on file to calculate BMI.   General:  Well nourished, well developed, male in no acute distress HEENT: normal Lymph: no adenopathy Neck: JVD -10 cm Endocrine:  No thryomegaly Vascular: No carotid bruits; 4/4 extremity pulses 2+  Cardiac:  normal S1, S2; irregular rate and rhythm; faint murmur Lungs: Rales bilaterally, no wheezing, rhonchi  Abd: soft, diffusely tender, no hepatomegaly  Ext: no edema Musculoskeletal:  No deformities, BUE and BLE strength normal and equal Skin: warm and dry  Neuro:  CNs 2-12 intact, no focal abnormalities noted Psych:  Normal affect   EKG:  The EKG was personally reviewed and demonstrates:   Atrial fib, RVR, HR 110, LBBB is old Telemetry:  Telemetry was personally reviewed and demonstrates:  Atrial fib, rate controlled PVCs and pairs   CV studies:   ECHO: 09/08/2020 1. Left ventricular ejection fraction, by estimation, is 50 to 55%. The  left ventricle has low normal function. The left ventricle demonstrates  regional wall motion abnormalities (see scoring diagram/findings for  description). There is severe left  ventricular hypertrophy. Left ventricular diastolic function could not be  evaluated. There is severe hypokinesis of the left ventricular, basal-mid  inferior wall.  2. Right ventricular systolic function is moderately reduced. The right  ventricular size is normal.  3. Right atrial size was mildly dilated.  4. The mitral valve is abnormal. Trivial mitral valve regurgitation.  5. The aortic valve is calcified. Aortic valve regurgitation is not  visualized. Moderate to severe aortic valve stenosis. Aortic valve area,  by VTI measures 0.87 cm. Aortic valve mean gradient measures 22.0 mmHg.  Aortic valve Vmax measures 2.99  m/s. DI  of 0.25.  6. Aortic dilatation noted. There is mild dilatation of the ascending  aorta, measuring 40 mm.  7. The inferior vena cava is dilated in size with <50% respiratory  variability, suggesting right atrial pressure of 15 mmHg.   CATH: 04/01/2015  Dist Graft to Insertion into the native obtuse marginal had a severe lesion, in-stent restenosis, 95% stenosed. This area was treated with a 2.75 x 24 Synergy drug-eluting stent, postdilated to 3.3 mm in diameter. There is a 0% residual stenosis post intervention. The lesion was previously treated with a stent (unknown type) greater than two years ago.  Severe native three-vessel coronary artery disease. RCA was documented occluded by prior cath. This was not injected to save contrast. Patent LIMA to LAD. Patent SVG to posterior lateral artery. Patent SVG to PDA.  A drug-eluting  stent was placed.   Continue clopidogrel for antiplatelets therapy. Will restart aspirin for now. He will likely be started on Coumadin for atrial fibrillation. Once his Coumadin is therapeutic, will likely stop aspirin. Long-term, he should be on a combination of Coumadin and clopidogrel, along with aggressive secondary prevention.  Only 60 mL of dye was used for the entire procedure. The patient will be aggressively hydrated postprocedure due to his renal insufficiency. Intervention      Laboratory Data:   Chemistry Recent Labs  Lab 09/15/20 1140 09/20/20 0843  NA 135 131*  K 4.0 4.3  CL 97 94*  CO2 22 24  GLUCOSE 196* 258*  BUN 25 20  CREATININE 1.84* 1.79*  CALCIUM 9.2 9.8  GFRNONAA 33* 37*  GFRAA 38*  --   ANIONGAP  --  13    Lab Results  Component Value Date   ALT 19 09/08/2020   AST 14 (L) 09/08/2020   ALKPHOS 67 09/08/2020   BILITOT 1.8 (H) 09/08/2020   Hematology Recent Labs  Lab 09/20/20 0843  WBC 18.5*  RBC 4.74  HGB 14.2  HCT 42.6  MCV 89.9  MCH 30.0  MCHC 33.3  RDW 12.1  PLT 173   Cardiac Enzymes High Sensitivity Troponin:   Recent Labs  Lab 09/07/20 1422 09/07/20 1619 09/07/20 2212 09/20/20 0843 09/20/20 1130  TROPONINIHS 79* 89* 99* 67* 103*      BNP Recent Labs  Lab 09/20/20 1130  BNP 685.4*    DDimer  Recent Labs  Lab 09/20/20 1208  DDIMER 0.76*   TSH:  Lab Results  Component Value Date   TSH 3.82 01/14/2020   Lipids: Lab Results  Component Value Date   CHOL 109 01/14/2020   HDL 26.50 (L) 01/14/2020   LDLCALC 56 01/14/2020   LDLDIRECT 143.3 11/23/2007   TRIG 132.0 01/14/2020   CHOLHDL 4 01/14/2020   HgbA1c: Lab Results  Component Value Date   HGBA1C 7.3 (H) 09/07/2020   Magnesium:  Magnesium  Date Value Ref Range Status  09/08/2020 2.0 1.7 - 2.4 mg/dL Final    Comment:    Performed at [facility], [place] [address], [place], [place] [address]   Urinalysis    Component Value Date/Time    COLORURINE YELLOW 07/26/2019 1111   APPEARANCEUR CLEAR 07/26/2019 1111   LABSPEC 1.009 07/26/2019 1111   PHURINE 6.0 07/26/2019 1111   GLUCOSEU NEGATIVE 07/26/2019 1111   HGBUR NEGATIVE 07/26/2019 1111   HGBUR trace-intact 11/23/2007 0000   BILIRUBINUR NEGATIVE 07/26/2019 1111   KETONESUR NEGATIVE 07/26/2019 1111   PROTEINUR 30 (A) 07/26/2019 1111   UROBILINOGEN 0.2 02/16/2011 2229  NITRITE NEGATIVE 07/26/2019 [place] 07/26/2019 1111      Radiology/Studies:  DG Chest 2 View  Result Date: 09/20/2020 CLINICAL DATA:  Chest pain, shortness of breath EXAM: CHEST - 2 VIEW COMPARISON:  Chest x-rays dated 09/07/2020 in 04/02/2020 FINDINGS: Borderline cardiomegaly, stable. Median sternotomy wires appear intact and stable in alignment. Lungs are clear. No pleural effusion or pneumothorax is seen. No acute appearing osseous abnormality. IMPRESSION: No active cardiopulmonary disease. No evidence of pneumonia or pulmonary edema. Electronically Signed   By: Franki Cabot M.D.   On: 09/20/2020 09:11    Assessment and Plan:   1.  Chest pain: -Although troponins have a mild elevation, this is more consistent with CHF or acute illness than ACS -Continue ASA, Plavix, statin, beta-blocker -EF 50-55% by recent echo, with severe LVH -MD advise if any additional ischemic evaluation is indicated  2.  Aortic stenosis: -The mean gradient was 22, but he has severe LVH and is at risk to have normal EF, low gradient, severe AS -This means we need to be very careful with diuresis, he will not do well if he gets too dry  3.  Low-grade fever and elevated white count -Blood cultures have been drawn, no results yet -Chest x-ray without acute disease, no urinalysis this admission -Management per IM  4.  Chronic diastolic CHF: -He has some mild volume overload by exam - ReDS vest readings will help, get those when available. -He may have some volume overload right now, but O2 sats are  96% on room air -Continue home diuretics of Lasix 40 mg twice daily and spironolactone 25 mg a day   Active Problems:   * No active hospital problems. *     For questions or updates, please contact [place] Please consult [email or web address] for contact info under Cardiology/STEMI.   Signed, Rosaria Ferries, PA-C  09/20/2020 1:54 PM  Congestive heart failure-acute/chronic-diastolic  Atrial fibrillation-persistent  Left bundle branch block  Aortic stenosis question moderate versus severe question low volume  Recurrent fever/leukocytosis  COPD/persistent asthma   Patient presents with fevers worsening nocturnal dyspnea and orthopnea dyspnea on exertion in the setting of persistent atrial fibrillation, moderate to severe aortic stenosis and severe left ventricular hypertrophy.  He was hospitalized a few weeks ago when he presented with fever.  Thought to have a pulmonary exacerbation and improved with steroids.  He returns again with low-grade fever and leukocytosis suggesting a recurrence of an infectious process.  The question has been raised as to endocarditis.  Blood cultures have been obtained.  Certainly his aortic valve, been is abnormal as it is, is at risk.  As relates to his chronic worsening congestive heart failure I wonder to what degree his atrial fibrillation is not contributing.  We will plan to initiate amiodarone with the anticipation of repeat cardioversion but with a discussion with Dr. [facility] on Monday for his input as he knows this patient extremely well.  Loading and cardioversion could be done as an outpatient.

## 2020-09-20 NOTE — ED Triage Notes (Signed)
Pt reports SOB, "heart pounding", and chest pain x 3 days.  States he was admitted to hospital with SOB approx 2 weeks ago.

## 2020-09-21 DIAGNOSIS — R079 Chest pain, unspecified: Secondary | ICD-10-CM

## 2020-09-21 DIAGNOSIS — I4819 Other persistent atrial fibrillation: Secondary | ICD-10-CM | POA: Diagnosis not present

## 2020-09-21 DIAGNOSIS — I480 Paroxysmal atrial fibrillation: Secondary | ICD-10-CM

## 2020-09-21 DIAGNOSIS — I208 Other forms of angina pectoris: Secondary | ICD-10-CM

## 2020-09-21 DIAGNOSIS — I25119 Atherosclerotic heart disease of native coronary artery with unspecified angina pectoris: Secondary | ICD-10-CM

## 2020-09-21 DIAGNOSIS — I10 Essential (primary) hypertension: Secondary | ICD-10-CM

## 2020-09-21 DIAGNOSIS — I5032 Chronic diastolic (congestive) heart failure: Secondary | ICD-10-CM

## 2020-09-21 DIAGNOSIS — I447 Left bundle-branch block, unspecified: Secondary | ICD-10-CM

## 2020-09-21 LAB — POTASSIUM: Potassium: 4 mmol/L (ref 3.5–5.1)

## 2020-09-21 LAB — CBC WITH DIFFERENTIAL/PLATELET
Abs Immature Granulocytes: 0.05 10*3/uL (ref 0.00–0.07)
Basophils Absolute: 0 10*3/uL (ref 0.0–0.1)
Basophils Relative: 0 %
Eosinophils Absolute: 0.5 10*3/uL (ref 0.0–0.5)
Eosinophils Relative: 4 %
HCT: 36.3 % — ABNORMAL LOW (ref 39.0–52.0)
Hemoglobin: 12.8 g/dL — ABNORMAL LOW (ref 13.0–17.0)
Immature Granulocytes: 0 %
Lymphocytes Relative: 9 %
Lymphs Abs: 1 10*3/uL (ref 0.7–4.0)
MCH: 30.6 pg (ref 26.0–34.0)
MCHC: 35.3 g/dL (ref 30.0–36.0)
MCV: 86.8 fL (ref 80.0–100.0)
Monocytes Absolute: 1.1 10*3/uL — ABNORMAL HIGH (ref 0.1–1.0)
Monocytes Relative: 10 %
Neutro Abs: 8.7 10*3/uL — ABNORMAL HIGH (ref 1.7–7.7)
Neutrophils Relative %: 77 %
Platelets: 142 10*3/uL — ABNORMAL LOW (ref 150–400)
RBC: 4.18 MIL/uL — ABNORMAL LOW (ref 4.22–5.81)
RDW: 12.2 % (ref 11.5–15.5)
WBC: 11.4 10*3/uL — ABNORMAL HIGH (ref 4.0–10.5)
nRBC: 0 % (ref 0.0–0.2)

## 2020-09-21 LAB — COMPREHENSIVE METABOLIC PANEL
ALT: 25 U/L (ref 0–44)
AST: 14 U/L — ABNORMAL LOW (ref 15–41)
Albumin: 3 g/dL — ABNORMAL LOW (ref 3.5–5.0)
Alkaline Phosphatase: 72 U/L (ref 38–126)
Anion gap: 12 (ref 5–15)
BUN: 25 mg/dL — ABNORMAL HIGH (ref 8–23)
CO2: 24 mmol/L (ref 22–32)
Calcium: 9.4 mg/dL (ref 8.9–10.3)
Chloride: 96 mmol/L — ABNORMAL LOW (ref 98–111)
Creatinine, Ser: 1.94 mg/dL — ABNORMAL HIGH (ref 0.61–1.24)
GFR, Estimated: 33 mL/min — ABNORMAL LOW (ref >60)
Glucose, Bld: 181 mg/dL — ABNORMAL HIGH (ref 70–99)
Potassium: 3.3 mmol/L — ABNORMAL LOW (ref 3.5–5.1)
Sodium: 132 mmol/L — ABNORMAL LOW (ref 135–145)
Total Bilirubin: 1.8 mg/dL — ABNORMAL HIGH (ref 0.3–1.2)
Total Protein: 6.7 g/dL (ref 6.5–8.1)

## 2020-09-21 LAB — MAGNESIUM
Magnesium: 1.8 mg/dL (ref 1.7–2.4)
Magnesium: 2 mg/dL (ref 1.7–2.4)

## 2020-09-21 LAB — GLUCOSE, CAPILLARY
Glucose-Capillary: 156 mg/dL — ABNORMAL HIGH (ref 70–99)
Glucose-Capillary: 142 mg/dL — ABNORMAL HIGH (ref 70–99)
Glucose-Capillary: 162 mg/dL — ABNORMAL HIGH (ref 70–99)
Glucose-Capillary: 188 mg/dL — ABNORMAL HIGH (ref 70–99)

## 2020-09-21 LAB — TSH: TSH: 2.809 u[IU]/mL (ref 0.350–4.500)

## 2020-09-21 LAB — PHOSPHORUS: Phosphorus: 3.9 mg/dL (ref 2.5–4.6)

## 2020-09-21 LAB — PROCALCITONIN: Procalcitonin: 0.62 ng/mL

## 2020-09-21 MED ORDER — POTASSIUM CHLORIDE CRYS ER 20 MEQ PO TBCR
50.0000 meq | EXTENDED_RELEASE_TABLET | Freq: Once | ORAL | Status: AC
  Administered 2020-09-21: 50 meq via ORAL
  Filled 2020-09-21: qty 1

## 2020-09-21 MED ORDER — FUROSEMIDE 10 MG/ML IJ SOLN
80.0000 mg | Freq: Two times a day (BID) | INTRAMUSCULAR | Status: DC
  Administered 2020-09-21: 80 mg via INTRAVENOUS
  Filled 2020-09-21 (×2): qty 8

## 2020-09-21 MED ORDER — LEVALBUTEROL HCL 1.25 MG/0.5ML IN NEBU
1.2500 mg | INHALATION_SOLUTION | Freq: Four times a day (QID) | RESPIRATORY_TRACT | Status: DC | PRN
  Filled 2020-09-21: qty 0.5

## 2020-09-21 NOTE — Progress Notes (Addendum)
Patient Name: [name]   Patient Profile:   [name] is a 86 y.o. male with a hx of CAD/CABG, persistent AF, DM2, HTN, HLD, CKD, and chronic systolic and diastolic heart failure w/ mod-severe ( Low volume/normal EF) AS, seen 12/4 for dyspnea and chest pain in setting of low grade fever ( recurrent)  Amio started with thought about proceeding with DCCV for HFpEF; IV furosemide   SUBJECTIVE: less pain, breathing ok at rest but poor exercise tolerance with ambulation did not diurese with IV lasix last pm    Past Medical History:  Diagnosis Date  . Aortic stenosis, moderate 09/20/2020  . Arthritis   . CAD (coronary artery disease)    a. Cath September 2015 LIMA to the LAD patent, SVG to PDA patent, SVG to posterior lateral patent, SVG to OM with a 90% in-stent restenosis at an anastomotic lesion. This was treated with angioplasty. b. cath 04/01/2015 95% ISR in SVG to OM treated with 2.75x24 Synergy DES postdilated to 3.36mm, all other grafts patent  . Cataract    surgery,B/L  . CHF (congestive heart failure) ([place])   . Chronic kidney disease    nephrolithiasis  . Diabetes mellitus    TYPE 2  . GERD (gastroesophageal reflux disease)   . Heart murmur   . Hernia   . Hypercholesterolemia   . Hypertension   . Incontinence    hx  over 1 year,leaks without awareness  . Other and unspecified diseases of appendix   . PAF (paroxysmal atrial fibrillation) ([place])    in the setting of ischemia on 03/31/2015  . Pancreatitis   . Prostate CA ([place]) 03/01/04   prostate bx=Adenocarcinoam,gleason 3+4=7,PSA=6.75  . Shortness of breath dyspnea   . Ulcer    hx gastric    Scheduled Meds:  Scheduled Meds: . amiodarone  400 mg Oral BID  . amLODipine  5 mg Oral Daily  . apixaban  2.5 mg Oral BID  . aspirin EC  81 mg Oral Daily  . carvedilol  6.25 mg Oral BID WC  . clopidogrel  75 mg Oral Daily  . docusate sodium  100 mg Oral QHS  . fluticasone furoate-vilanterol  1 puff Inhalation  Daily  . furosemide  40 mg Oral BID  . insulin aspart  0-9 Units Subcutaneous TID WC  . linagliptin  5 mg Oral Daily  . nitroGLYCERIN  0.4 mg Sublingual Once  . pantoprazole  40 mg Oral Daily  . rosuvastatin  20 mg Oral Daily  . sodium chloride flush  3 mL Intravenous Q12H  . spironolactone  25 mg Oral Daily   Continuous Infusions: . sodium chloride     sodium chloride, acetaminophen, fentaNYL (SUBLIMAZE) injection, guaiFENesin-dextromethorphan, hydrocortisone cream, ipratropium-albuterol, ondansetron (ZOFRAN) IV, sodium chloride flush    PHYSICAL EXAM Vitals:   09/20/20 2024 09/21/20 0457 09/21/20 0832 09/21/20 0915  BP: (!) 124/55 111/73  117/62  Pulse: (!) 51 67 67   Resp: 18 18 18 18   Temp: 98.6 F (37 C) 98.4 F (36.9 C) 98.3 F (36.8 C)   TempSrc: Oral Oral Oral   SpO2: 97% 99% 97%   Weight:  76.9 kg    Height:         HENT normal Neck supple   Coarse BS with prolonged expiration irregular rate and rhythm, no murmurs or gallops Abd-soft with active BS No Clubbing cyanosis edema Skin-warm and dry A & Oriented  Grossly normal sensory and motor  function  Telemetry Personally reviewed afib w CVR  Intake/Output Summary (Last 24 hours) at 09/21/2020 1228 Last data filed at 09/21/2020 0850 Gross per 24 hour  Intake 123 ml  Output 430 ml  Net -307 ml    LABS: Basic Metabolic Panel: Recent Labs  Lab 09/15/20 1140 09/15/20 1140 09/20/20 0843 09/21/20 0329  NA 135  --  131* 132*  K 4.0  --  4.3 3.3*  CL 97  --  94* 96*  CO2 22  --  24 24  GLUCOSE 196*  --  258* 181*  BUN 25  --  20 25*  CREATININE 1.84*  --  1.79* 1.94*  CALCIUM 9.2   < > 9.8 9.4  MG  --   --   --  1.8  PHOS  --   --   --  3.9   < > = values in this interval not displayed.   Cardiac Enzymes: No results for input(s): CKTOTAL, CKMB, CKMBINDEX, TROPONINI in the last 72 hours. CBC: Recent Labs  Lab 09/20/20 0843 09/20/20 1703 09/21/20 0329  WBC 18.5* 14.3* 11.4*  NEUTROABS  --    --  8.7*  HGB 14.2 12.8* 12.8*  HCT 42.6 35.7* 36.3*  MCV 89.9 86.7 86.8  PLT 173 137* 142*   PROTIME: No results for input(s): LABPROT, INR in the last 72 hours. Liver Function Tests: Recent Labs    09/21/20 0329  AST 14*  ALT 25  ALKPHOS 72  BILITOT 1.8*  PROT 6.7  ALBUMIN 3.0*   No results for input(s): LIPASE, AMYLASE in the last 72 hours. BNP: BNP (last 3 results) Recent Labs    12/27/19 1120 09/07/20 1619 09/20/20 1130  BNP 234.3* 426.6* 685.4*    ProBNP (last 3 results) Recent Labs    04/02/20 1053  PROBNP 359.0*    D-Dimer: Recent Labs    09/20/20 1208  DDIMER 0.76*   Hemoglobin A1C: No results for input(s): HGBA1C in the last 72 hours. Fasting Lipid Panel: No results for input(s): CHOL, HDL, LDLCALC, TRIG, CHOLHDL, LDLDIRECT in the last 72 hours. Thyroid Function Tests: Recent Labs    09/21/20 0329  TSH 2.809   Anemia Panel: No results for input(s): VITAMINB12, FOLATE, FERRITIN, TIBC, IRON, RETICCTPCT in the last 72 hours.  12/5  BC at day 1     ASSESSMENT AND PLAN: Congestive heart failure-acute/chronic-diastolic  Atrial fibrillation-persistent  Left bundle branch block  PVCs  Aortic stenosis question moderate versus severe question low volume  Recurrent fever/leukocytosis  COPD/persistent asthma  Hypokalemia repleted   Did not respond to furosemide 40 last pm IV  Will give 80 today-- PE suggests COPD +/- fluid, but CxR not all that impressive for COPD  BNP elevated compared to 11/21  Continue amio loading and will discuss plan with [facility] in am  My mistake--he should NOT be on ASA with plavix and Apixoban  >> will stop  PVCs about 5-10 % -- follow   No further fevers     Signed, Virl Axe MD  09/21/2020

## 2020-09-21 NOTE — Evaluation (Signed)
Physical Therapy Evaluation Patient Details Name: [name] MRN: [id number] DOB: 07-11-1933 Today's Date: 09/21/2020   History of Present Illness  [name] is a 86 y.o. male with a hx of  CAD/CABG, persistent AF, DM2, HTN, HLD, CKD, and chronic systolic and diastolic heart failure w/ mod-severe ( Low volume/normal EF) AS, seen 12/4 for dyspnea and chest pain in setting of low grade fever ( recurrent).   Clinical Impression  Pt admitted with above diagnosis. Pt was able to ambulate without device with min guard assist limited distance due to DOE3/4 and pt wanted to get back to room.  Recommended Rollator for home but pt states he doesn't feel like he needs it. Wife supportive. Will follow acutely. Pt currently with functional limitations due to the deficits listed below (see PT Problem List). Pt will benefit from skilled PT to increase their independence and safety with mobility to allow discharge to the venue listed below.      Follow Up Recommendations Home health PT;Supervision/Assistance - 24 hour    Equipment Recommendations  Other (comment) (rollator recommended but pt states he wont use it)    Recommendations for Other Services       Precautions / Restrictions Precautions Precautions: Fall Restrictions Weight Bearing Restrictions: No      Mobility  Bed Mobility Overal bed mobility: Independent                  Transfers Overall transfer level: Needs assistance   Transfers: Sit to/from Stand Sit to Stand: Supervision         General transfer comment: No assist needed.   Ambulation/Gait Ambulation/Gait assistance: Min guard Gait Distance (Feet): 110 Feet Assistive device: None Gait Pattern/deviations: Decreased stride length;Step-through pattern   Gait velocity interpretation: <1.31 ft/sec, indicative of household ambulator General Gait Details: Pt tolerated ambulation in hallway with steady gait overall.  DOE 3/4 with pt able to make it back to room  but was very fatigues.  O2 sats >90% throughout.  Educated in pursed lip breathing.   Stairs            Wheelchair Mobility    Modified Rankin (Stroke Patients Only)       Balance Overall balance assessment: Needs assistance Sitting-balance support: No upper extremity supported;Feet supported Sitting balance-Leahy Scale: Fair     Standing balance support: No upper extremity supported;During functional activity Standing balance-Leahy Scale: Fair Standing balance comment: can stand statically and maintain balance.                              Pertinent Vitals/Pain Pain Assessment: Faces Faces Pain Scale: Hurts a little bit Pain Location: chest Pain Descriptors / Indicators: Discomfort Pain Intervention(s): Limited activity within patient's tolerance;Monitored during session;Repositioned    Home Living Family/patient expects to be discharged to:: Private residence Living Arrangements: Spouse/significant other Available Help at Discharge: Family;Available 24 hours/day Type of Home: House Home Access: Stairs to enter Entrance Stairs-Rails: None Entrance Stairs-Number of Steps: 2 Home Layout: One level Home Equipment: Cane - single point      Prior Function Level of Independence: Independent         Comments: wife occasionally helped with getting socks on     Hand Dominance   Dominant Hand: Right    Extremity/Trunk Assessment   Upper Extremity Assessment Upper Extremity Assessment: Defer to OT evaluation    Lower Extremity Assessment Lower Extremity Assessment: Generalized weakness  Cervical / Trunk Assessment Cervical / Trunk Assessment: Normal  Communication   Communication: No difficulties  Cognition Arousal/Alertness: Awake/alert Behavior During Therapy: WFL for tasks assessed/performed Overall Cognitive Status: Within Functional Limits for tasks assessed                                        General Comments  General comments (skin integrity, edema, etc.): 61 bpm, 97% RA, 119/52    Exercises     Assessment/Plan    PT Assessment Patient needs continued PT services  PT Problem List Decreased activity tolerance;Decreased balance;Decreased mobility;Decreased knowledge of use of DME;Decreased safety awareness;Cardiopulmonary status limiting activity;Decreased knowledge of precautions       PT Treatment Interventions DME instruction;Gait training;Functional mobility training;Therapeutic activities;Therapeutic exercise;Stair training;Balance training;Patient/family education    PT Goals (Current goals can be found in the Care Plan section)  Acute Rehab PT Goals Patient Stated Goal: to g o home PT Goal Formulation: With patient Time For Goal Achievement: 10/05/20 Potential to Achieve Goals: Good    Frequency Min 3X/week   Barriers to discharge        Co-evaluation               AM-PAC PT "6 Clicks" Mobility  Outcome Measure Help needed turning from your back to your side while in a flat bed without using bedrails?: None Help needed moving from lying on your back to sitting on the side of a flat bed without using bedrails?: None Help needed moving to and from a bed to a chair (including a wheelchair)?: A Little Help needed standing up from a chair using your arms (e.g., wheelchair or bedside chair)?: A Little Help needed to walk in hospital room?: A Little Help needed climbing 3-5 steps with a railing? : A Little 6 Click Score: 20    End of Session Equipment Utilized During Treatment: Gait belt Activity Tolerance: Patient limited by fatigue Patient left: in chair;with call bell/phone within reach;with chair alarm set;with family/visitor present Nurse Communication: Mobility status PT Visit Diagnosis: Muscle weakness (generalized) (M62.81)    Time: 1125-1150 PT Time Calculation (min) (ACUTE ONLY): 25 min   Charges:   PT Evaluation $PT Eval Moderate Complexity: 1 Mod PT  Treatments $Gait Training: 8-22 mins        Lucianna Ostlund W,PT Acute Rehabilitation Services Pager:  [phone]  Office:  [place] 09/21/2020, 2:37 PM

## 2020-09-21 NOTE — Progress Notes (Signed)
PROGRESS NOTE    [name]  DVV:[id number] DOB: 13-Apr-1933 DOA: 09/20/2020 PCP: [name], MD     Brief Narrative:  86 y.o. WM PMHx CAD with CABG x4, stenting x5 with and stenting stenosis, CKD stage III, Chronic Diastolic CHF, HTN, Paroxysmal A. fib on Eliquis, DM type II uncontrolled with complication/DM nephropathy, Nephrolithiasis, pancreatitis, Prostate cancer, COPD,   Presented with worsening of shortness of breath, fever and chest pain.  Patient was hospitalized 2 weeks ago for COPD exacerbation.  Last mission, patient had low-grade fever.  After treatment with systemic steroid, bronchodilator and antibiotics, patient was discharged home.  Patient did not tolerate steroid in the hospital so steroid was not ordered for discharge.  Patient was supposed to take 5 more days of Omnicef, but he did not tolerate after day 1, so he stopped taking on his own.  After that, patient has developed increasing shortness of breath, initially was exertional become more frequent and constant.    For last 2 days, patient had low-grade fever, the day before yesterday he had subjective fever but did not measure but last night he spiked fever 102.  Then he took some Tylenol and went to sleep, around 2:00 this morning, patient woke up with severe pressure-like chest pain and palpitations and severe shortness of breath. Suspecting A. fib comes back, he checked his pulses which was regular and not fast.  Took 1 sublingual nitro and the pain subsided in about 30 minutes, but no later chest pain came back again with associated shortness of breath and palpitations, again he took 1 sublingual nitro and pain subsided in 30 minutes.  And early this morning around 5 had another such episode.  Patient denied any cough, no dysuria no diarrhea. ED Course: Low-grade fever 100.2, leukocytosis 18.5.  Troponin 67> 103, chest x-ray lungs are clear.   Subjective: Afebrile overnight hard of hearing, positive DOE,  negative CP, negative S OB.  States he never completed his antibiotics from previous hospitalization because they made him extremely nauseous.   Assessment & Plan: Covid vaccination; vaccinated   Active Problems:   HTN (hypertension)   CAD in native artery   LBBB (left bundle branch block)   PAF (paroxysmal atrial fibrillation) (HCC)   CKD (chronic kidney disease), stage III (HCC)   Acute on chronic diastolic CHF (congestive heart failure) (HCC)   Persistent atrial fibrillation (HCC)   Angina at rest [facility])   Chronic diastolic (congestive) heart failure (HCC)   Aortic stenosis, moderate  Acute on chronic diastolic CHF -Patient currently appears euvolemic -Patient seen by EP Dr. Virl Axe per his note plans to discuss case with [facility] in the a.m. to determine plan. -Amiodarone 400 mg BID -Amlodipine 5 mg daily -Coreg 6.25 mg BID -Lasix IV 80 mg BID -Spironolactone 25 mg daily  CAD -See CHF  LBBB -See CHF  Paroxysmal atrial fibrillation -Apixaban 2.5 mg BID -Currently in sinus rhythm ---> sinus arrhythmia  HTN -See CHF  COPD -Brio Ellipta 200-25 mcg/INH 1 puff daily -Given patient's history of paroxysmal atrial fibrillation change inhaler to Xopenex PRN  Fever -On admission no fever patient had mildly elevated temp at 37.9 C   CKD stage IIIb (baseline Cr 1.84)  Lab Results  Component Value Date   CREATININE 1.94 (H) 09/21/2020   CREATININE 1.79 (H) 09/20/2020   CREATININE 1.84 (H) 09/15/2020   CREATININE 2.20 (H) 09/08/2020   CREATININE 2.01 (H) 09/07/2020  -12/5 patient's creatinine had trended down to his baseline  now trending back up.  BUN/CR ratio appears we may be drying him out a little too much.  If continues to trend up would cut back on his diuretic in the a.m.  DM type II with complication/DM Nephropathy  -11/21 hemoglobin A1c= 7.3 -Sensitive SSI -Tradjenta 5 mg daily  Hypokalemia -Potassium goal> 4 -K-Dur 50 mEq -Recheck K/Mg 1400;  WNL    DVT prophylaxis: Eliquis Code Status: Full Family Communication: 12/5 wife present at bedside for discussion of plan of care answered all questions Status is: Inpatient    Dispo: The patient is from: Home              Anticipated d/c is to: Home              Anticipated d/c date is: Per cardiology              Patient currently unstable      Consultants:  Cardiology EP Dr. Caryl Comes   Procedures/Significant Events:  11/22 Echocardiogram; LVEF 50 to 55%. The left ventricle demonstrates regional wall motion abnormalities.  -Severe hypokinesis of the left ventricular, basal-mid inferior wall.  -Severe LVH Abnormal (paradoxical) septal motion, consistent with left bundle branch block.  Left ventricular diastolic function could not be evaluated due to atrial fibrillation.  Right Ventricle:Right ventricular systolic function is moderately reduced.  Aortic Valve: Moderate to severe aortic stenosis ----------------------------------------------------------------------------------------------------------------------------------------- 12/4 CXR negative cardiopulmonary disease     I have personally reviewed and interpreted all radiology studies and my findings are as above.  VENTILATOR SETTINGS:    Cultures   Antimicrobials:    Devices    LINES / TUBES:      Continuous Infusions: . sodium chloride       Objective: Vitals:   09/21/20 0915 09/21/20 1243 09/21/20 1500 09/21/20 1633  BP: 117/62 (!) 120/51 116/75   Pulse:  60 62   Resp: 18 17  16   Temp:  98 F (36.7 C)  98.1 F (36.7 C)  TempSrc:  Oral  Oral  SpO2:   97%   Weight:      Height:        Intake/Output Summary (Last 24 hours) at 09/21/2020 1910 Last data filed at 09/21/2020 7353 Gross per 24 hour  Intake 123 ml  Output 430 ml  Net -307 ml   Filed Weights   09/20/20 1503 09/20/20 1712 09/21/20 0457  Weight: 81.6 kg 77.9 kg 76.9 kg    Examination:  General: A/O x4, No acute  respiratory distress Eyes: negative scleral hemorrhage, negative anisocoria, negative icterus ENT: Negative Runny nose, negative gingival bleeding, extremely hard of hearing Neck:  Negative scars, masses, torticollis, lymphadenopathy, JVD Lungs: Clear to auscultation bilaterally without wheezes or crackles Cardiovascular: Sinus arrhythmia without murmur gallop or rub normal S1 and S2 Abdomen: negative abdominal pain, nondistended, positive soft, bowel sounds, no rebound, no ascites, no appreciable mass Extremities: No significant cyanosis, clubbing, or edema bilateral lower extremities Skin: Negative rashes, lesions, ulcers Psychiatric:  Negative depression, negative anxiety, negative fatigue, negative mania  Central nervous system:  Cranial nerves II through XII intact, tongue/uvula midline, all extremities muscle strength 5/5, sensation intact throughout, negative dysarthria, negative expressive aphasia, negative receptive aphasia.  .     Data Reviewed: Care during the described time interval was provided by me .  I have reviewed this patient's available data, including medical history, events of note, physical examination, and all test results as part of my evaluation.  CBC: Recent Labs  Lab 09/20/20  3536 09/20/20 1703 09/21/20 0329  WBC 18.5* 14.3* 11.4*  NEUTROABS  --   --  8.7*  HGB 14.2 12.8* 12.8*  HCT 42.6 35.7* 36.3*  MCV 89.9 86.7 86.8  PLT 173 137* 144*   Basic Metabolic Panel: Recent Labs  Lab 09/15/20 1140 09/20/20 0843 09/21/20 0329 09/21/20 1423  NA 135 131* 132*  --   K 4.0 4.3 3.3* 4.0  CL 97 94* 96*  --   CO2 22 24 24   --   GLUCOSE 196* 258* 181*  --   BUN 25 20 25*  --   CREATININE 1.84* 1.79* 1.94*  --   CALCIUM 9.2 9.8 9.4  --   MG  --   --  1.8 2.0  PHOS  --   --  3.9  --    GFR: Estimated Creatinine Clearance: 27.8 mL/min (A) (by C-G formula based on SCr of 1.94 mg/dL (H)). Liver Function Tests: Recent Labs  Lab 09/21/20 0329  AST 14*   ALT 25  ALKPHOS 72  BILITOT 1.8*  PROT 6.7  ALBUMIN 3.0*   No results for input(s): LIPASE, AMYLASE in the last 168 hours. No results for input(s): AMMONIA in the last 168 hours. Coagulation Profile: No results for input(s): INR, PROTIME in the last 168 hours. Cardiac Enzymes: No results for input(s): CKTOTAL, CKMB, CKMBINDEX, TROPONINI in the last 168 hours. BNP (last 3 results) Recent Labs    04/02/20 1053  PROBNP 359.0*   HbA1C: No results for input(s): HGBA1C in the last 72 hours. CBG: Recent Labs  Lab 09/20/20 1721 09/20/20 2128 09/21/20 0753 09/21/20 1213 09/21/20 1613  GLUCAP 310* 219* 156* 142* 162*   Lipid Profile: No results for input(s): CHOL, HDL, LDLCALC, TRIG, CHOLHDL, LDLDIRECT in the last 72 hours. Thyroid Function Tests: Recent Labs    09/21/20 0329  TSH 2.809   Anemia Panel: No results for input(s): VITAMINB12, FOLATE, FERRITIN, TIBC, IRON, RETICCTPCT in the last 72 hours. Sepsis Labs: Recent Labs  Lab 09/20/20 1703 09/21/20 0329  PROCALCITON 0.50 0.62    Recent Results (from the past 240 hour(s))  Resp Panel by RT-PCR (Flu A&B, Covid) Nasopharyngeal Swab     Status: None   Collection Time: 09/20/20 12:04 PM   Specimen: Nasopharyngeal Swab; Nasopharyngeal(NP) swabs in vial transport medium  Result Value Ref Range Status   SARS Coronavirus 2 by RT PCR NEGATIVE NEGATIVE Final    Comment: (NOTE) SARS-CoV-2 target nucleic acids are NOT DETECTED.  The SARS-CoV-2 RNA is generally detectable in upper respiratory specimens during the acute phase of infection. The lowest concentration of SARS-CoV-2 viral copies this assay can detect is 138 copies/mL. A negative result does not preclude SARS-Cov-2 infection and should not be used as the sole basis for treatment or other patient management decisions. A negative result may occur with  improper specimen collection/handling, submission of specimen other than nasopharyngeal swab, presence of viral  mutation(s) within the areas targeted by this assay, and inadequate number of viral copies(<138 copies/mL). A negative result must be combined with clinical observations, patient history, and epidemiological information. The expected result is Negative.  Fact Sheet for Patients:  EntrepreneurPulse.com.au  Fact Sheet for Healthcare Providers:  IncredibleEmployment.be  This test is no t yet approved or cleared by the [place] FDA and  has been authorized for detection and/or diagnosis of SARS-CoV-2 by FDA under an Emergency Use Authorization (EUA). This EUA will remain  in effect (meaning this test can be used) for the duration  of the COVID-19 declaration under Section 564(b)(1) of the Act, 21 U.S.C.section 360bbb-3(b)(1), unless the authorization is terminated  or revoked sooner.       Influenza A by PCR NEGATIVE NEGATIVE Final   Influenza B by PCR NEGATIVE NEGATIVE Final    Comment: (NOTE) The Xpert Xpress SARS-CoV-2/FLU/RSV plus assay is intended as an aid in the diagnosis of influenza from Nasopharyngeal swab specimens and should not be used as a sole basis for treatment. Nasal washings and aspirates are unacceptable for Xpert Xpress SARS-CoV-2/FLU/RSV testing.  Fact Sheet for Patients: EntrepreneurPulse.com.au  Fact Sheet for Healthcare Providers: IncredibleEmployment.be  This test is not yet approved or cleared by the [place] FDA and has been authorized for detection and/or diagnosis of SARS-CoV-2 by FDA under an Emergency Use Authorization (EUA). This EUA will remain in effect (meaning this test can be used) for the duration of the COVID-19 declaration under Section 564(b)(1) of the Act, 21 U.S.C. section 360bbb-3(b)(1), unless the authorization is terminated or revoked.  Performed at [facility], [place] [address], [place], [place] [address]   Culture, blood (routine x 2)      Status: None (Preliminary result)   Collection Time: 09/20/20  3:07 PM   Specimen: BLOOD  Result Value Ref Range Status   Specimen Description BLOOD SITE NOT SPECIFIED  Final   Special Requests   Final    BOTTLES DRAWN AEROBIC AND ANAEROBIC Blood Culture results may not be optimal due to an inadequate volume of blood received in culture bottles   Culture   Final    NO GROWTH < 24 HOURS Performed at [facility], [place] [address] [place], [place] [address]    Report Status PENDING  Incomplete  Culture, blood (routine x 2)     Status: None (Preliminary result)   Collection Time: 09/20/20  3:23 PM   Specimen: BLOOD  Result Value Ref Range Status   Specimen Description BLOOD SITE NOT SPECIFIED  Final   Special Requests   Final    BOTTLES DRAWN AEROBIC AND ANAEROBIC Blood Culture results may not be optimal due to an inadequate volume of blood received in culture bottles   Culture   Final    NO GROWTH < 24 HOURS Performed at [facility], [place] [address], [place], [place] [address]    Report Status PENDING  Incomplete         Radiology Studies: DG Chest 2 View  Result Date: 09/20/2020 CLINICAL DATA:  Chest pain, shortness of breath EXAM: CHEST - 2 VIEW COMPARISON:  Chest x-rays dated 09/07/2020 in 04/02/2020 FINDINGS: Borderline cardiomegaly, stable. Median sternotomy wires appear intact and stable in alignment. Lungs are clear. No pleural effusion or pneumothorax is seen. No acute appearing osseous abnormality. IMPRESSION: No active cardiopulmonary disease. No evidence of pneumonia or pulmonary edema. Electronically Signed   By: Franki Cabot M.D.   On: 09/20/2020 09:11        Scheduled Meds: . amiodarone  400 mg Oral BID  . amLODipine  5 mg Oral Daily  . apixaban  2.5 mg Oral BID  . carvedilol  6.25 mg Oral BID WC  . clopidogrel  75 mg Oral Daily  . docusate sodium  100 mg Oral QHS  . fluticasone furoate-vilanterol  1 puff Inhalation Daily  . furosemide  80 mg  Intravenous BID  . insulin aspart  0-9 Units Subcutaneous TID WC  . linagliptin  5 mg Oral Daily  . nitroGLYCERIN  0.4  mg Sublingual Once  . pantoprazole  40 mg Oral Daily  . rosuvastatin  20 mg Oral Daily  . sodium chloride flush  3 mL Intravenous Q12H  . spironolactone  25 mg Oral Daily   Continuous Infusions: . sodium chloride       LOS: 1 day    Time spent:40 min    Gailene Youkhana, Geraldo Docker, MD [facility] Pager [phone]  If 7PM-7AM, please contact night-coverage [email or web address] Password [email or web address] 09/21/2020, 7:10 PM

## 2020-09-22 ENCOUNTER — Encounter (HOSPITAL_COMMUNITY): Payer: Self-pay | Admitting: Internal Medicine

## 2020-09-22 DIAGNOSIS — I350 Nonrheumatic aortic (valve) stenosis: Secondary | ICD-10-CM | POA: Diagnosis not present

## 2020-09-22 DIAGNOSIS — I5033 Acute on chronic diastolic (congestive) heart failure: Secondary | ICD-10-CM

## 2020-09-22 LAB — CBC WITH DIFFERENTIAL/PLATELET
Abs Immature Granulocytes: 0.08 10*3/uL — ABNORMAL HIGH (ref 0.00–0.07)
Basophils Absolute: 0 10*3/uL (ref 0.0–0.1)
Basophils Relative: 0 %
Eosinophils Absolute: 0.6 10*3/uL — ABNORMAL HIGH (ref 0.0–0.5)
Eosinophils Relative: 5 %
HCT: 36.4 % — ABNORMAL LOW (ref 39.0–52.0)
Hemoglobin: 12.4 g/dL — ABNORMAL LOW (ref 13.0–17.0)
Immature Granulocytes: 1 %
Lymphocytes Relative: 9 %
Lymphs Abs: 1 10*3/uL (ref 0.7–4.0)
MCH: 30.1 pg (ref 26.0–34.0)
MCHC: 34.1 g/dL (ref 30.0–36.0)
MCV: 88.3 fL (ref 80.0–100.0)
Monocytes Absolute: 1 10*3/uL (ref 0.1–1.0)
Monocytes Relative: 9 %
Neutro Abs: 8.6 10*3/uL — ABNORMAL HIGH (ref 1.7–7.7)
Neutrophils Relative %: 76 %
Platelets: 153 10*3/uL (ref 150–400)
RBC: 4.12 MIL/uL — ABNORMAL LOW (ref 4.22–5.81)
RDW: 12.1 % (ref 11.5–15.5)
WBC: 11.2 10*3/uL — ABNORMAL HIGH (ref 4.0–10.5)
nRBC: 0.2 % (ref 0.0–0.2)

## 2020-09-22 LAB — COMPREHENSIVE METABOLIC PANEL
ALT: 37 U/L (ref 0–44)
AST: 24 U/L (ref 15–41)
Albumin: 3 g/dL — ABNORMAL LOW (ref 3.5–5.0)
Alkaline Phosphatase: 72 U/L (ref 38–126)
Anion gap: 12 (ref 5–15)
BUN: 29 mg/dL — ABNORMAL HIGH (ref 8–23)
CO2: 24 mmol/L (ref 22–32)
Calcium: 9.3 mg/dL (ref 8.9–10.3)
Chloride: 98 mmol/L (ref 98–111)
Creatinine, Ser: 2.13 mg/dL — ABNORMAL HIGH (ref 0.61–1.24)
GFR, Estimated: 30 mL/min — ABNORMAL LOW (ref >60)
Glucose, Bld: 150 mg/dL — ABNORMAL HIGH (ref 70–99)
Potassium: 3.9 mmol/L (ref 3.5–5.1)
Sodium: 134 mmol/L — ABNORMAL LOW (ref 135–145)
Total Bilirubin: 1.4 mg/dL — ABNORMAL HIGH (ref 0.3–1.2)
Total Protein: 6.7 g/dL (ref 6.5–8.1)

## 2020-09-22 LAB — PHOSPHORUS: Phosphorus: 3.1 mg/dL (ref 2.5–4.6)

## 2020-09-22 LAB — MAGNESIUM: Magnesium: 2 mg/dL (ref 1.7–2.4)

## 2020-09-22 LAB — GLUCOSE, CAPILLARY
Glucose-Capillary: 171 mg/dL — ABNORMAL HIGH (ref 70–99)
Glucose-Capillary: 170 mg/dL — ABNORMAL HIGH (ref 70–99)
Glucose-Capillary: 172 mg/dL — ABNORMAL HIGH (ref 70–99)
Glucose-Capillary: 89 mg/dL (ref 70–99)
Glucose-Capillary: 167 mg/dL — ABNORMAL HIGH (ref 70–99)

## 2020-09-22 LAB — PROCALCITONIN: Procalcitonin: 0.5 ng/mL

## 2020-09-22 MED ORDER — MELATONIN 5 MG PO TABS
10.0000 mg | ORAL_TABLET | Freq: Every evening | ORAL | Status: DC | PRN
  Administered 2020-09-22 – 2020-09-23 (×2): 10 mg via ORAL
  Filled 2020-09-22 (×2): qty 2

## 2020-09-22 NOTE — Progress Notes (Addendum)
Progress Note  Patient Name: [name] Date of Encounter: 09/22/2020  Primary Cardiologist: [name], MD  Subjective   Still feels a vague chest tightness, but no further chest pain. No orthopnea, palpitations.  Inpatient Medications    Scheduled Meds: . amiodarone  400 mg Oral BID  . amLODipine  5 mg Oral Daily  . apixaban  2.5 mg Oral BID  . carvedilol  6.25 mg Oral BID WC  . clopidogrel  75 mg Oral Daily  . docusate sodium  100 mg Oral QHS  . fluticasone furoate-vilanterol  1 puff Inhalation Daily  . furosemide  80 mg Intravenous BID  . insulin aspart  0-9 Units Subcutaneous TID WC  . linagliptin  5 mg Oral Daily  . nitroGLYCERIN  0.4 mg Sublingual Once  . pantoprazole  40 mg Oral Daily  . rosuvastatin  20 mg Oral Daily  . sodium chloride flush  3 mL Intravenous Q12H  . spironolactone  25 mg Oral Daily   Continuous Infusions: . sodium chloride     PRN Meds: sodium chloride, acetaminophen, fentaNYL (SUBLIMAZE) injection, guaiFENesin-dextromethorphan, hydrocortisone cream, levalbuterol, ondansetron (ZOFRAN) IV, sodium chloride flush   Vital Signs    Vitals:   09/21/20 1633 09/21/20 2006 09/22/20 0420 09/22/20 0741  BP:  122/62 127/67 135/82  Pulse:  65 64 64  Resp: 16 18  18   Temp: 98.1 F (36.7 C) 98.2 F (36.8 C) (!) 95.3 F (35.2 C) 98 F (36.7 C)  TempSrc: Oral Oral Oral Oral  SpO2:  97% 94% 96%  Weight:   76.2 kg   Height:        Intake/Output Summary (Last 24 hours) at 09/22/2020 0939 Last data filed at 09/22/2020 0650 Gross per 24 hour  Intake 360 ml  Output 750 ml  Net -390 ml   Last 3 Weights 09/22/2020 09/21/2020 09/20/2020  Weight (lbs) 167 lb 15.9 oz 169 lb 8.5 oz 171 lb 11.8 oz  Weight (kg) 76.2 kg 76.9 kg 77.9 kg     Telemetry    Afib LBBB occasional PVCs,  Has had some HRs in the 40s since last night including this morning during wakeful hours, range 40s-70s mostly Personally Reviewed  Physical Exam   GEN: No acute  distress.  HEENT: Normocephalic, atraumatic, sclera non-icteric. Neck: No JVD or bruits. Cardiac: irregularly irregular, rate controlled, soft SEM RUSB, no rubs or gallops.  Radials/DP/PT 1+ and equal bilaterally.  Respiratory: Mildly diminished throughout but otherwise clear to auscultation bilaterally. Breathing is unlabored. GI: Soft, nontender, non-distended, BS +x 4. MS: no deformity. Extremities: No clubbing or cyanosis. No edema. Distal pedal pulses are 2+ and equal bilaterally. Neuro:  AAOx3. Follows commands. Psych:  Responds to questions appropriately with a normal affect.  Labs    High Sensitivity Troponin:   Recent Labs  Lab 09/07/20 1422 09/07/20 1619 09/07/20 2212 09/20/20 0843 09/20/20 1130  TROPONINIHS 79* 89* 99* 67* 103*      Cardiac EnzymesNo results for input(s): TROPONINI in the last 168 hours. No results for input(s): TROPIPOC in the last 168 hours.   Chemistry Recent Labs  Lab 09/15/20 1140 09/15/20 1140 09/20/20 0843 09/20/20 0843 09/21/20 0329 09/21/20 1423 09/22/20 0400  NA 135  --  131*  --  132*  --  134*  K 4.0   < > 4.3   < > 3.3* 4.0 3.9  CL 97   < > 94*  --  96*  --  98  CO2 22   < >  24  --  24  --  24  GLUCOSE 196*  --  258*  --  181*  --  150*  BUN 25  --  20  --  25*  --  29*  CREATININE 1.84*   < > 1.79*  --  1.94*  --  2.13*  CALCIUM 9.2   < > 9.8  --  9.4  --  9.3  PROT  --   --   --   --  6.7  --  6.7  ALBUMIN  --   --   --   --  3.0*  --  3.0*  AST  --   --   --   --  14*  --  24  ALT  --   --   --   --  25  --  37  ALKPHOS  --   --   --   --  72  --  72  BILITOT  --   --   --   --  1.8*  --  1.4*  GFRNONAA 33*  --  37*  --  33*  --  30*  GFRAA 38*  --   --   --   --   --   --   ANIONGAP  --   --  13  --  12  --  12   < > = values in this interval not displayed.     Hematology Recent Labs  Lab 09/20/20 1703 09/21/20 0329 09/22/20 0400  WBC 14.3* 11.4* 11.2*  RBC 4.12* 4.18* 4.12*  HGB 12.8* 12.8* 12.4*  HCT  35.7* 36.3* 36.4*  MCV 86.7 86.8 88.3  MCH 31.1 30.6 30.1  MCHC 35.9 35.3 34.1  RDW 12.2 12.2 12.1  PLT 137* 142* 153    BNP Recent Labs  Lab 09/20/20 1130  BNP 685.4*     DDimer  Recent Labs  Lab 09/20/20 1208  DDIMER 0.76*     Radiology    No results found.  Cardiac Studies   2D echo 09/08/20  1. Left ventricular ejection fraction, by estimation, is 50 to 55%. The  left ventricle has low normal function. The left ventricle demonstrates  regional wall motion abnormalities (see scoring diagram/findings for  description). There is severe left  ventricular hypertrophy. Left ventricular diastolic function could not be  evaluated. There is severe hypokinesis of the left ventricular, basal-mid  inferior wall.  2. Right ventricular systolic function is moderately reduced. The right  ventricular size is normal.  3. Right atrial size was mildly dilated.  4. The mitral valve is abnormal. Trivial mitral valve regurgitation.  5. The aortic valve is calcified. Aortic valve regurgitation is not  visualized. Moderate to severe aortic valve stenosis. Aortic valve area,  by VTI measures 0.87 cm. Aortic valve mean gradient measures 22.0 mmHg.  Aortic valve Vmax measures 2.99 m/s. DI  of 0.25.  6. Aortic dilatation noted. There is mild dilatation of the ascending  aorta, measuring 40 mm.  7. The inferior vena cava is dilated in size with <50% respiratory  variability, suggesting right atrial pressure of 15 mmHg.   Comparison(s): Changes from prior study are noted. 07/27/2019: LVEF 60-65%,  severe LVH, mild AS - peak and mean gradients of 15/26 mmHg.  Patient Profile     86 y.o. male with CAD s/p prior CABG with subsequent PCIs (PTCA 2015, DES to SVG-OM 2016), persistent Af, DM2, HTN, HLD, LBBB, CKD stage 3-4, chronic  combined CHF with moderate-severe aortic stenosis, prostate CA, carotid artery disease (1-76% RICA, 16-07% LICA in 12/7104), gastric ulcer, arthritis. Per  notes he has had recurrent afib despite DCCV and rate control. He was recently admitted 08/2020 with CHF/COPD exacerbation and fever. Had significant improvement with steroids. Outpatient stress test was planned for low/flat troponin.    He presented back to the ED 09/20/2020 with worsening orthopnea, PND, low grade fever, heart pounding and chest discomfort. Started on amiodarone for persistent afib.  Assessment & Plan    1. Recurrent chest pain/shortness of breath in context of known CAD s/p prior CABG/PCIs and aortic stenosis - hsTroponin 67->104 - d-dimer slightly elevated but wnl for age adjustment, already anticoagulated for afib so PE seems less likely - CXR without any fluid, BNP only mildly elevated, volume status looks OK - will review plan with MD (OP stress test previously suggested last admission - clearly many moving parts here), renal function makes cath less ideal - on ASA, Plavix and Eliquis per admit MAR as OP - now on Plavis and Eliquis here which was his regimen when seen in cardiology office this summer  2. Aortic stenosis - last echo showed moderate to severe aortic stenosis - will discuss eval with MD whether this could be contributing to his presentation  3. Persistent atrial fibrillation - anticoagulated with Eliquis, consider holding if he is in need of any invasive procedures - Dr. Caryl Comes recommended initiation of amiodarone this admission with consideration of cardioversion as OP although wanted to review with primary cardiologist - Dr. Percival Spanish previously managed patient with rate control strategy as he did not feel this was driving majority of symptoms - has been in atrial fib most of this year ever since cardioversion did not hold in January 2021 so does not seem that atrial fib is the acute issue - baseline HR was in the 50s-60s pre-amiodarone. He is observed to have some bradycardia in the 40s including during wakeful hours now. Per d/w Dr. Acie Fredrickson, will stop  amiodarone and hold this AM's carvedilol dose with next dose this evening with hold parameters in place  4. Acute on chronic diastolic CHF, complicated by CKD stage III-IV - volume status doesn't look that bad today - with rising Cr, will hold further IV Lasix and spironolactone pending review with MD (trend 2.01 on admission -> 2.2 -> 1.84 -> 1.79 - > 1.94 -> 2.13, previous baseline looks to be quite variable over several years time from 1.6-2.1 range)  5. Low grade fever and leukocytosis  - no specific etiology observed - Per IM - also noted to have weight loss - was 192lb in 2016, 180s last year, now 168lb  6. PVCs - per Dr. Caryl Comes 5-10%, follow   For questions or updates, please contact [facility] Please consult [email or web address] for contact info under Cardiology/STEMI.  Signed, Charlie Pitter, PA-C 09/22/2020, 9:39 AM     Attending Note:   The patient was seen and examined.  Agree with assessment and plan as noted above.  Changes made to the above note as needed.  Patient seen and independently examined with Melina Copa, PA .   We discussed all aspects of the encounter. I agree with the assessment and plan as stated above.  1.   Aortic stenosis:   I think this is his major CV issue. His AS is severe - low gradient low output AS.   He has reduced radial  Pulses.    I'm not sure if he  is a candidate for TAVR but we will consult our structural team to get an opinion.  2.   + troponins :   More likely to be related to his underlying known CAD in the setting of severe AS   3.  CKD:   Will continue to follow       I have spent a total of 40 minutes with patient reviewing hospital  notes , telemetry, EKGs, labs and examining patient as well as establishing an assessment and plan that was discussed with the patient. > 50% of time was spent in direct patient care.    Thayer Headings, Brooke Bonito., MD, [facility] 09/22/2020, 11:28 AM [address]  [place] Pager [phone]

## 2020-09-22 NOTE — Progress Notes (Signed)
PROGRESS NOTE   [name]  YWV:[id number] DOB: 10/04/1933 DOA: 09/20/2020 PCP: [name], MD  Brief Narrative:  86 year old white male CABG 6 4 stent X5 with stents and in-stent stenosis HFpEF, HTN, paroxysmal A. fib on Eliquis, DM TY 2 with nephropathy, prostate cancer, COPD, prior pancreatitis  Recent hospitalization 11/21--11/22 DOE, COPD-Rx for COPD, CHF-repeat echo showed progression of aortic stenosis which might of contributed-was discharged home but did not tolerate steroids or not did not use and did not use Omnicef  Admit 12/4 SOB fever CP-spiked fever 102-developed pressure-like chest pain palpitations severe shortness of breath took nitroglycerin Came in with shortness of breath and acute on diastolic heart failure  Assessment & Plan:   Active Problems:   HTN (hypertension)   CAD in native artery   LBBB (left bundle branch block)   PAF (paroxysmal atrial fibrillation) (HCC)   CKD (chronic kidney disease), stage III (HCC)   Acute on chronic diastolic CHF (congestive heart failure) (HCC)   Persistent atrial fibrillation (HCC)   [facility])   Chronic diastolic (congestive) heart failure (HCC)   Aortic stenosis, moderate   1. Decompensated HFpEF probably secondary to severe AAS a. Still has JVD on exam b. Lasix/Aldactone held by cardiology secondary to creatinine to range c. ?  Needs stress testing?  Needs TAVR-defer to cardiology 2. CAD a. Still complaining of vague chest pain says it was much greater prior to admission b. Continue Coreg 6.25 twice daily c. Continue aspirin 81 3. Paroxysmal A. Fib a. Was on amiodarone earlier in admission but discontinued secondary to bradycardia b. Continue Coreg as above c. Patient remains in A. fib with PVCs-continue Eliquis 2.5 twice daily 4. HTN a. Amlodipine 5 in addition to above 5. COPD 6. Recently treated pneumonia a. Leukocytosis improved on its own b. Procalcitonin low c.  no sputum-hold further  antibiotics at this time d. Last fever 100.212/4 7. CKD 3B, probable cardiorenal syndrome a. Defer diuresis to cardiology 8. DM TY 2 a. CBG ranging 1 50-1 70 on linagliptin and sliding scale   DVT prophylaxis: Eliquis Code Status: Full Family Communication: Discussed with wife at bedside Disposition:  Status is: Inpatient  Remains inpatient appropriate because:Hemodynamically unstable, Ongoing active pain requiring inpatient pain management and Altered mental status   Dispo: The patient is from: Home              Anticipated d/c is to: Home              Anticipated d/c date is: > 3 days              Patient currently is not medically stable to d/c.       Consultants:   Cardiology  Procedures: None  Antimicrobials: None currently   Subjective: Extremely hard of hearing-thankfully wife is at bedside No complaints of cough cold fever or chills No sputum Sitting up in bed quite strong No chest pain Shortness of breath seems better  Objective: Vitals:   09/21/20 1633 09/21/20 2006 09/22/20 0420 09/22/20 0741  BP:  122/62 127/67 135/82  Pulse:  65 64 64  Resp: 16 18  18   Temp: 98.1 F (36.7 C) 98.2 F (36.8 C) (!) 95.3 F (35.2 C) 98 F (36.7 C)  TempSrc: Oral Oral Oral Oral  SpO2:  97% 94% 96%  Weight:   76.2 kg   Height:        Intake/Output Summary (Last 24 hours) at 09/22/2020 0818 Last data  filed at 09/22/2020 0650 Gross per 24 hour  Intake 360 ml  Output 780 ml  Net -420 ml   Filed Weights   09/20/20 1712 09/21/20 0457 09/22/20 0420  Weight: 77.9 kg 76.9 kg 76.2 kg    Examination:  EOMI NCAT no focal deficit Chest clear no added sound no rales rhonchi JVD elevated Abdomen soft no rebound no guarding Trace lower extremity edema Neurologically intact moving all 4 limbs   Data Reviewed: I have personally reviewed following labs and imaging studies BUNs/creatinine 25/1.9-->29/2.1 Albumin 3.0 WBC 14.3-->11.2  Radiology Studies: DG Chest  2 View  Result Date: 09/20/2020 CLINICAL DATA:  Chest pain, shortness of breath EXAM: CHEST - 2 VIEW COMPARISON:  Chest x-rays dated 09/07/2020 in 04/02/2020 FINDINGS: Borderline cardiomegaly, stable. Median sternotomy wires appear intact and stable in alignment. Lungs are clear. No pleural effusion or pneumothorax is seen. No acute appearing osseous abnormality. IMPRESSION: No active cardiopulmonary disease. No evidence of pneumonia or pulmonary edema. Electronically Signed   By: Franki Cabot M.D.   On: 09/20/2020 09:11     Scheduled Meds: . amiodarone  400 mg Oral BID  . amLODipine  5 mg Oral Daily  . apixaban  2.5 mg Oral BID  . carvedilol  6.25 mg Oral BID WC  . clopidogrel  75 mg Oral Daily  . docusate sodium  100 mg Oral QHS  . fluticasone furoate-vilanterol  1 puff Inhalation Daily  . furosemide  80 mg Intravenous BID  . insulin aspart  0-9 Units Subcutaneous TID WC  . linagliptin  5 mg Oral Daily  . nitroGLYCERIN  0.4 mg Sublingual Once  . pantoprazole  40 mg Oral Daily  . rosuvastatin  20 mg Oral Daily  . sodium chloride flush  3 mL Intravenous Q12H  . spironolactone  25 mg Oral Daily   Continuous Infusions: . sodium chloride       LOS: 2 days    Time spent: [place], MD [facility] To contact the attending provider between 7A-7P or the covering provider during after hours 7P-7A, please log into the web site [email or web address] and access using universal [place] password for that web site. If you do not have the password, please call the hospital operator.  09/22/2020, 8:18 AM

## 2020-09-22 NOTE — Consult Note (Addendum)
[facility] Consultation:   Patient ID: [name] MRN: [id number]; DOB: 09/16/1933  Admit date: 09/20/2020 Date of Consult: 09/22/2020  Primary Care Provider: [name], MD [facility] Cardiologist: [name], MD  [place] Electrophysiologist:  None    Patient Profile:   [name] is a 86 y.o. male with a hx of CAD s/p prior CABG x4V (2009 PVT) with subsequent PCIs (PTCA 2015, DES to SVG-OM 2016), persistent afib on Eliquis, T2DM, HTN, HLD, LBBB, PVCs, CKD stage III-IV (creat baseline 1.6-2.1), COPD/asthma, chronic combined CHF, prostate CA, carotid artery disease (5-46% RICA, 27-03% LICA in 02/92), gastric ulcer, arthritis and moderate to severe AS who is being seen today for the evaluation of severe AS at the request of Dr. Acie Fredrickson.  History of Present Illness:   [name] lives in [place] with his wife.  He has 2 daughters and several grandchildren.  He was previously very active doing yard work and cutting wood, but over the last several years he is slowed down significantly.  He still drives and occasionally goes to [employer] or [employer].  He does not walk with the aid of a walker or cane.  Of note he has full top dentures and most of his native teeth on the bottom.  He has not seen a dentist in many years.  He is fully vaccinated for Covid 19 with the [place] vaccine.  He presented with unstable angina with severe three-vessel coronary artery disease and reduced left ventricular function in 2009. He underwent CABG X4 (LIMA to LAD, SVG to PDA, SVG to posterolateral, SVG to OM) by Dr. Nils Pyle. He subsequently underwent cardiac catheterization in 2015 at which time the LIMA to LAD was patent, SVG to PDA patent, SVG to posterolateral patent, SVG to OM had 90% in-stent restenosis at an anastomotic lesion this was treated with angioplasty. He presented to [facility] in 03/2015 with recurrent  chest pain and underwent repeat cardiac cath which showed a 95% in-stent restenosis of SVG to OM, treated with 2.75 x 24 mm Synergy DES. He had patent LIMA to LAD, patent SVG to posterolateral, patent SVG to PDA. He was noted to have afib at that time but anticoagulation was deferred given need for DAPT. He later developed persistent afib and was started on renally dosed Eliquis.   He reported worsening SOB in Dec 2018. Lexiscan Myoview 09/2047 showed a fixed inferior defect.  CPX testing suggested a mixed restrictive/obstructive pattern of PFTs. He was not thought to have a cardiovascular limitation.   He was admitted to [facility] in 07/2019 with acute on chronic diastolic CHF. Echo at that time showed EF 60-65%, severe LVH, mild AS with mean gradient of 16 mm Hg.   He was seen in follow up by Dr. Percival Spanish in 09/2019 and continued to have dyspnea. A Holter monitor was placed which revealed persistent afib. He had a cardioversion in 10/2019 with ERAF. It was unclear if he had any symptomatic benefit with restoration of sinus. The patient continued to complain of fatigue and dyspnea and he was referred to pulmonology where he was diagnosed with COPD/moderate persistent asthma and started on inhalers.   He was admitted 11/21-11/22/21 for chest pain and dyspnea in the setting of a febrile illness. Blood cultures and covid were negative. He was felt to have acute on chronic diastolic CHF and AECOPD. He improved with IV lasix and steroids. Repeat echo showed  EF 50-55%, severe LVH, moderate to severe AS with a mean gradient of 22 mm Hg, peak gradient 35.8 mm hg, AVA 0.8cm2, DVI 0.25 SVI 29, mild dilation of the ascending aorta at 40 mm. Plans were to refer to structural heart and stress testing in the outpatient setting.   He was in his usual state of health until 12/4 when he presented to [facility] ED with chest pain, dyspnea and palpitations. Work up revealed elevated BNP at 685.4, CXR with no acute abnormality, HS trop  67--> 103 felt to be demand ischemia, creat 1.79--> 1.94--> 2.13, covid neg, Ddimer 0.76, blood cultures NGTD. He was started on amiodarone which was later discontinued due to bradycardia. He was treated with IV lasix which was later discontinued due to worsening renal function.   He is seen laying in bed today with his wife at his bedside.  He complains of several years of dyspnea and fatigue.  However, over the past few weeks he has had significant progression to the point where he cannot even walk across the room without stopping several times.  He also has had accelerated exertional chest pain that is relieved with rest and sublingual nitroglycerin.  He admits to bendopnea, orthopnea and PND.  He often has to sleep in his recliner.  He intermittently has dizzy spells, mostly with positional changes.  He denies syncope.  He occasionally gets lower extremity edema.  He is interested in pursuing any treatment that could potentially help him feel better and become more active again.    Past Medical History:  Diagnosis Date  . Aortic stenosis, moderate 09/20/2020  . Arthritis   . CAD (coronary artery disease)    a. Cath September 2015 LIMA to the LAD patent, SVG to PDA patent, SVG to posterior lateral patent, SVG to OM with a 90% in-stent restenosis at an anastomotic lesion. This was treated with angioplasty. b. cath 04/01/2015 95% ISR in SVG to OM treated with 2.75x24 Synergy DES postdilated to 3.69mm, all other grafts patent  . Cataract    surgery,B/L  . CHF (congestive heart failure) ([place])   . Chronic kidney disease    nephrolithiasis  . Diabetes mellitus    TYPE 2  . GERD (gastroesophageal reflux disease)   . Hernia   . Hypercholesterolemia   . Hypertension   . Incontinence    hx  over 1 year,leaks without awareness  . Other and unspecified diseases of appendix   . PAF (paroxysmal atrial fibrillation) ([place])    in the setting of ischemia on 03/31/2015  . Pancreatitis   . Prostate CA ([place])  03/01/04   prostate bx=Adenocarcinoam,gleason 3+4=7,PSA=6.75  . Shortness of breath dyspnea   . Ulcer    hx gastric    Past Surgical History:  Procedure Laterality Date  . APPENDECTOMY    . BACK SURGERY    . CARDIAC CATHETERIZATION N/A 04/01/2015   Procedure: Left Heart Cath and Cors/Grafts Angiography;  Surgeon: Jettie Booze, MD;  Location: [place] CV LAB;  Service: Cardiovascular;  Laterality: N/A;  . CARDIAC CATHETERIZATION N/A 04/01/2015   Procedure: Coronary Stent Intervention;  Surgeon: Jettie Booze, MD;  Location: [place] CV LAB;  Service: Cardiovascular;  Laterality: N/A;  . CARDIOVERSION N/A 11/01/2019   Procedure: CARDIOVERSION;  Surgeon: Geralynn Rile, MD;  Location: [facility];  Service: Cardiovascular;  Laterality: N/A;  . CARPAL TUNNEL RELEASE  2004   both hands  . CORONARY ARTERY BYPASS GRAFT    . CYSTOSCOPY  08/23/12  incomplete emoptying bladder  . HERNIA REPAIR    . incision and drainage of right chest abscess    . LAPAROSCOPIC CHOLECYSTECTOMY  09/2009  . LEFT HEART CATHETERIZATION WITH CORONARY ANGIOGRAM N/A 07/19/2014   Procedure: LEFT HEART CATHETERIZATION WITH CORONARY ANGIOGRAM;  Surgeon: Leonie Man, MD;  Location: [facility] CATH LAB;  Service: Cardiovascular;  Laterality: N/A;  . RECTAL SURGERY    . ROBOT ASSISTED LAPAROSCOPIC RADICAL PROSTATECTOMY  2005     Home Medications:  Prior to Admission medications   Medication Sig Start Date End Date Taking? Authorizing Provider  acetaminophen (TYLENOL) 500 MG tablet Take 1,000 mg by mouth every 4 (four) hours as needed for fever or headache (pain).   Yes [provider]  albuterol (PROVENTIL) (2.5 MG/3ML) 0.083% nebulizer solution Take 3 mLs (2.5 mg total) by nebulization every 4 (four) hours as needed for wheezing or shortness of breath. 07/28/19  Yes Ivor Costa, MD  amLODipine (NORVASC) 5 MG tablet Take 1 tablet (5 mg total) by mouth daily. 07/30/20  Yes Burchette, Alinda Sierras, MD   aspirin EC 81 MG tablet Take 81 mg by mouth daily. Swallow whole.   Yes [provider]  blood glucose meter kit and supplies KIT Dispense based on patient and insurance preference. Use up to four times daily as directed. (FOR ICD-9 250.00, 250.01). 09/08/20  Yes Kamineni, Lamount Cranker, MD  carvedilol (COREG) 6.25 MG tablet TAKE 1 TABLET (6.25 MG TOTAL) BY MOUTH 2 (TWO) TIMES DAILY WITH A MEAL. 08/27/20  Yes [name], MD  cefdinir (OMNICEF) 300 MG capsule Take 1 capsule (300 mg total) by mouth daily. 09/09/20  Yes Guilford Shi, MD  clopidogrel (PLAVIX) 75 MG tablet TAKE 1 TABLET (75 MG TOTAL) BY MOUTH DAILY. Patient taking differently: Take 75 mg by mouth daily.  03/14/20  Yes Burchette, Alinda Sierras, MD  docusate sodium (STOOL SOFTENER) 100 MG capsule Take 1 capsule (100 mg total) by mouth 2 (two) times daily as needed for mild constipation. Patient taking differently: Take 100 mg by mouth at bedtime.  07/28/19  Yes Ivor Costa, MD  ELIQUIS 2.5 MG TABS tablet TAKE 1 TABLET BY MOUTH TWICE A DAY Patient taking differently: Take 2.5 mg by mouth 2 (two) times daily.  08/27/20  Yes Burchette, Alinda Sierras, MD  furosemide (LASIX) 40 MG tablet Take 1 tablet (40 mg total) by mouth 2 (two) times daily. 07/30/20  Yes Burchette, Alinda Sierras, MD  guaiFENesin-dextromethorphan (ROBITUSSIN DM) 100-10 MG/5ML syrup Take 5 mLs by mouth every 6 (six) hours as needed for cough. 09/08/20  Yes Guilford Shi, MD  hydrocortisone 2.5 % cream Apply 1 application topically daily as needed (facial breakouts).   Yes [provider]  Multiple Vitamins-Minerals (ICAPS AREDS 2 PO) Take 1 capsule by mouth daily.   Yes [provider]  nitroGLYCERIN (NITROSTAT) 0.4 MG SL tablet Place 1 tablet (0.4 mg total) under the tongue every 5 (five) minutes x 3 doses as needed for chest pain (if no relief after 3rd dose, proceed to the ED for an evaluation). 04/17/18  Yes [name], MD  pantoprazole (PROTONIX) 40 MG  tablet TAKE 1 TABLET BY MOUTH EVERY DAY Patient taking differently: Take 40 mg by mouth daily.  07/22/20  Yes Burchette, Alinda Sierras, MD  rosuvastatin (CRESTOR) 20 MG tablet TAKE 1 TABLET BY MOUTH EVERY DAY Patient taking differently: Take 20 mg by mouth daily.  09/08/20  Yes Burchette, Alinda Sierras, MD  sitaGLIPtin (JANUVIA) 50 MG tablet Take 1 tablet (  50 mg total) by mouth daily. 03/10/20  Yes Burchette, Alinda Sierras, MD  spironolactone (ALDACTONE) 25 MG tablet TAKE 1 TABLET BY MOUTH EVERY DAY Patient taking differently: Take 25 mg by mouth daily.  05/12/20  Yes [name], MD  albuterol (VENTOLIN HFA) 108 (90 Base) MCG/ACT inhaler Inhale 2 puffs into the lungs every 6 (six) hours as needed. Patient not taking: Reported on 09/20/2020 08/12/20   Spero Geralds, MD  budesonide-formoterol [facility]) 160-4.5 MCG/ACT inhaler Inhale 2 puffs into the lungs in the morning and at bedtime. Patient not taking: Reported on 09/20/2020 08/07/20   Spero Geralds, MD    Inpatient Medications: Scheduled Meds: . amLODipine  5 mg Oral Daily  . carvedilol  6.25 mg Oral BID WC  . clopidogrel  75 mg Oral Daily  . docusate sodium  100 mg Oral QHS  . fluticasone furoate-vilanterol  1 puff Inhalation Daily  . insulin aspart  0-9 Units Subcutaneous TID WC  . linagliptin  5 mg Oral Daily  . nitroGLYCERIN  0.4 mg Sublingual Once  . pantoprazole  40 mg Oral Daily  . rosuvastatin  20 mg Oral Daily  . sodium chloride flush  3 mL Intravenous Q12H   Continuous Infusions: . sodium chloride     PRN Meds: sodium chloride, acetaminophen, fentaNYL (SUBLIMAZE) injection, guaiFENesin-dextromethorphan, hydrocortisone cream, levalbuterol, ondansetron (ZOFRAN) IV, sodium chloride flush  Allergies:    Allergies  Allergen Reactions  . Codeine Other (See Comments)    Makes him feel goofy  . Morphine Other (See Comments)    Nightmares and felt bad    Social History:   Social History   Socioeconomic History  . Marital status:  Married    Spouse name: Not on file  . Number of children: 2  . Years of education: Not on file  . Highest education level: Not on file  Occupational History    Employer: RETIRED    Comment: Retired  Tobacco Use  . Smoking status: Former Smoker    Packs/day: 0.50    Years: 5.00    Pack years: 2.50    Types: Cigarettes    Quit date: 07/20/1972    Years since quitting: 48.2  . Smokeless tobacco: Never Used  . Tobacco comment: quit s  Vaping Use  . Vaping Use: Never used  Substance and Sexual Activity  . Alcohol use: No  . Drug use: No    Comment: quit smoking 40 years ago  . Sexual activity: Never  Other Topics Concern  . Not on file  Social History Narrative   Retired   Married      Social Determinants of Radio broadcast assistant Strain:   . Difficulty of Paying Living Expenses: Not on file  Food Insecurity:   . Worried About Charity fundraiser in the Last Year: Not on file  . Ran Out of Food in the Last Year: Not on file  Transportation Needs:   . Lack of Transportation (Medical): Not on file  . Lack of Transportation (Non-Medical): Not on file  Physical Activity:   . Days of Exercise per Week: Not on file  . Minutes of Exercise per Session: Not on file  Stress:   . Feeling of Stress : Not on file  Social Connections:   . Frequency of Communication with Friends and Family: Not on file  . Frequency of Social Gatherings with Friends and Family: Not on file  . Attends Religious Services: Not on file  .  Active Member of Clubs or Organizations: Not on file  . Attends Archivist Meetings: Not on file  . Marital Status: Not on file  Intimate Partner Violence:   . Fear of Current or Ex-Partner: Not on file  . Emotionally Abused: Not on file  . Physically Abused: Not on file  . Sexually Abused: Not on file    Family History:    Family History  Problem Relation Age of Onset  . Cancer Mother        stomach     ROS:  Please see the history of  present illness.  All other ROS reviewed and negative.     Physical Exam/Data:   Vitals:   09/21/20 2006 09/22/20 0420 09/22/20 0741 09/22/20 0959  BP: 122/62 127/67 135/82 126/76  Pulse: 65 64 64   Resp: 18  18   Temp: 98.2 F (36.8 C) (!) 95.3 F (35.2 C) 98 F (36.7 C)   TempSrc: Oral Oral Oral   SpO2: 97% 94% 96%   Weight:  76.2 kg    Height:        Intake/Output Summary (Last 24 hours) at 09/22/2020 1530 Last data filed at 09/22/2020 1003 Gross per 24 hour  Intake 363 ml  Output 925 ml  Net -562 ml   Last 3 Weights 09/22/2020 09/21/2020 09/20/2020  Weight (lbs) 167 lb 15.9 oz 169 lb 8.5 oz 171 lb 11.8 oz  Weight (kg) 76.2 kg 76.9 kg 77.9 kg     Body mass index is 24.81 kg/m.  General:  Well nourished, well developed, in no acute distress HEENT: normal Lymph: no adenopathy Neck: no JVD Endocrine:  No thryomegaly Cardiac:  normal S1, S2;irreg irreg, 3/6 SEM Lungs:  clear to auscultation bilaterally, no wheezing, rhonchi or rales  Abd: soft, nontender, no hepatomegaly  Ext: no edema Musculoskeletal:  No deformities, BUE and BLE strength normal and equal Skin: warm and dry  Neuro:  CNs 2-12 intact, no focal abnormalities noted Psych:  Normal affect   EKG:  The EKG was personally reviewed and demonstrates:  afib with LBBB and RVR Telemetry:  Telemetry was personally reviewed and demonstrates:  afib with LBBB and PVCs  Relevant CV Studies: 2D echo 09/08/20  1. Left ventricular ejection fraction, by estimation, is 50 to 55%. The  left ventricle has low normal function. The left ventricle demonstrates  regional wall motion abnormalities (see scoring diagram/findings for  description). There is severe left  ventricular hypertrophy. Left ventricular diastolic function could not be  evaluated. There is severe hypokinesis of the left ventricular, basal-mid  inferior wall.  2. Right ventricular systolic function is moderately reduced. The right  ventricular size is  normal.  3. Right atrial size was mildly dilated.  4. The mitral valve is abnormal. Trivial mitral valve regurgitation.  5. The aortic valve is calcified. Aortic valve regurgitation is not  visualized. Moderate to severe aortic valve stenosis. Aortic valve area,  by VTI measures 0.87 cm. Aortic valve mean gradient measures 22.0 mmHg.  Aortic valve Vmax measures 2.99 m/s. DI  of 0.25.  6. Aortic dilatation noted. There is mild dilatation of the ascending  aorta, measuring 40 mm.  7. The inferior vena cava is dilated in size with <50% respiratory  variability, suggesting right atrial pressure of 15 mmHg.   Comparison(s): Changes from prior study are noted. 07/27/2019: LVEF 60-65%,  severe LVH, mild AS - peak and mean gradients of 15/26 mmHg  Laboratory Data:  High Sensitivity Troponin:  Recent Labs  Lab 09/07/20 1422 09/07/20 1619 09/07/20 2212 09/20/20 0843 09/20/20 1130  TROPONINIHS 79* 89* 99* 67* 103*     Chemistry Recent Labs  Lab 09/20/20 0843 09/20/20 0843 09/21/20 0329 09/21/20 1423 09/22/20 0400  NA 131*  --  132*  --  134*  K 4.3   < > 3.3* 4.0 3.9  CL 94*  --  96*  --  98  CO2 24  --  24  --  24  GLUCOSE 258*  --  181*  --  150*  BUN 20  --  25*  --  29*  CREATININE 1.79*  --  1.94*  --  2.13*  CALCIUM 9.8  --  9.4  --  9.3  GFRNONAA 37*  --  33*  --  30*  ANIONGAP 13  --  12  --  12   < > = values in this interval not displayed.    Recent Labs  Lab 09/21/20 0329 09/22/20 0400  PROT 6.7 6.7  ALBUMIN 3.0* 3.0*  AST 14* 24  ALT 25 37  ALKPHOS 72 72  BILITOT 1.8* 1.4*   Hematology Recent Labs  Lab 09/20/20 1703 09/21/20 0329 09/22/20 0400  WBC 14.3* 11.4* 11.2*  RBC 4.12* 4.18* 4.12*  HGB 12.8* 12.8* 12.4*  HCT 35.7* 36.3* 36.4*  MCV 86.7 86.8 88.3  MCH 31.1 30.6 30.1  MCHC 35.9 35.3 34.1  RDW 12.2 12.2 12.1  PLT 137* 142* 153   BNP Recent Labs  Lab 09/20/20 1130  BNP 685.4*    DDimer  Recent Labs  Lab 09/20/20 1208   DDIMER 0.76*     Radiology/Studies:  DG Chest 2 View  Result Date: 09/20/2020 CLINICAL DATA:  Chest pain, shortness of breath EXAM: CHEST - 2 VIEW COMPARISON:  Chest x-rays dated 09/07/2020 in 04/02/2020 FINDINGS: Borderline cardiomegaly, stable. Median sternotomy wires appear intact and stable in alignment. Lungs are clear. No pleural effusion or pneumothorax is seen. No acute appearing osseous abnormality. IMPRESSION: No active cardiopulmonary disease. No evidence of pneumonia or pulmonary edema. Electronically Signed   By: Franki Cabot M.D.   On: 09/20/2020 09:11     STS Risk Calculator:  Procedure: Isolated AVR Risk of Mortality: 9.032% Renal Failure: 11.530% Permanent Stroke: 5.751% Prolonged Ventilation: 25.782% DSW Infection: 0.210% Reoperation: 6.791% Morbidity or Mortality: 40.454% Short Length of Stay: 10.691% Long Length of Stay: 23.497%  ______________________  [place]  KCCQ-12 09/22/2020  1 a. Ability to shower/bathe Moderately limited  1 b. Ability to walk 1 block Extremely limited  1 c. Ability to hurry/jog Other, Did not do  2. Edema feet/ankles/legs 1-2 times a week  3. Limited by fatigue All of the time  4. Limited by dyspnea All of the time  5. Sitting up / on 3+ pillows Every night  6. Limited enjoyment of life Extremely limited  7. Rest of life w/ symptoms Not at all satisfied  8 a. Participation in hobbies Severely limited  8 b. Participation in chores Severely limited  8 c. Visiting family/friends Severely limited      Assessment and Plan:   [name] is a 86 y.o. male with symptoms of severe, stage D3 aortic stenosis with NYHA Class IV symptoms currently admitted with acute CHF. I have reviewed the patient's recent echocardiogram which is notable for mildly reduced LV systolic EF 35-45%, severe LVH, moderate to severe AS with a mean gradient of 22 mm Hg, peak gradient 35.8 mm hg,  AVA 0.8cm2, DVI 0.25  SVI 29. Left and right heart catheterization has been deferred given advanced renal dysfunction.   I have reviewed the natural history of aortic stenosis with the patient. We have discussed the limitations of medical therapy and the poor prognosis associated with symptomatic aortic stenosis. We have reviewed potential treatment options, including palliative medical therapy, conventional surgical aortic valve replacement, and transcatheter aortic valve replacement. We discussed treatment options in the context of this patient's specific comorbid medical conditions.    The patient's predicted risk of mortality with conventional aortic valve replacement is 9.032% primarily based on CAD s/p prior CABG x4V (2009 PVT) with subsequent PCIs, persistent afib, T2DM, HTN, CKD stage III-IV, COPD/asthma, chronic combined CHF, carotid artery disease (1-39% RICA, 60-79% LICA in 03/2020). Other significant comorbid conditions include physical deconditioning.    The patient has had longstanding fatigue and dyspnea.  However, over the past few weeks he has had significant acceleration in his chest pain, dyspnea and fatigue to the point that he cannot do even light physical activity.  His symptoms may be multifactorial secondary to moderate to severe aortic valve stenosis as well as coronary artery disease. TAVR seems like a reasonable treatment option for this patient pending formal cardiac surgical consultation. We discussed typical evaluation which will require a left and right heart catheterization, gated cardiac CTA and a CTA of the chest/abdomen/pelvis to evaluate both his cardiac anatomy and peripheral vasculature. Follow-up testing will be arranged as an outpatient.  Contrast dye based studies will have to be staged given advanced kidney disease.  We will discuss his case with the multidisciplinary valve team tomorrow.  If his creatinine is trending in the right direction we can consider doing his heart cath tomorrow.  I  am holding his Eliquis for now.     New York Heart Association (NYHA) Functional Class NYHA Class IV   CHA2DS2-VASc Score = 5  This indicates a 7.2% annual risk of stroke. The patient's score is based upon: CHF History: 1 HTN History: 1 Diabetes History: 1 Stroke History: 0 Vascular Disease History: 0 Age Score: 2 Gender Score: 0         For questions or updates, please contact [facility] Please consult [email or web address] for contact info under    Signed, Cline Crock, PA-C  09/22/2020 3:30 PM   I have personally seen and examined this patient. I agree with the assessment and plan as outlined above.  86 yo male with history of CAD s/p CABG in 2009, subsequent PTCA in 2015 and DES SVG to OM in 2016, persistent atrial fibrillation on Eliquis, hyperlipidemia, HTN, LBBB, CKD stage 3, COPD, chronic diastolic CHF, moderate carotid artery disease and aortic stenosis who we are asked to see today to review his aortic stenosis and discuss potential TAVR. His aortic stenosis has been followed by serial echocardiograms and was mild by echo in October 2020. (mean gradient 16 mmHg, AVA around 0.88cm2, with dimensionless index 35. He was admitted 09/07/20 to 09/08/20 with CHF and improved clinically with diuresis with IV Lasix. He is now readmitted 09/21/20 with chest pain, orthopnea, PND and reported fevers at home. Some thought that there is a component of COPD exacerbation. He has been started on IV Lasix. He has been loaded with amiodarone per Dr. Graciela Husbands over the weekend. He did not tolerate amiodarone due to bradycardia. Lasix stopped secondary to worsened renal function. Mild troponin elevation. EKG with rapid atrial fibrillation with chronic LBBB  I have reviewed all labs.  He tells me today that he feels better today. Clear change over the last month with chest pressure, dyspnea and fatigue.   My exam:  General: Well developed, well nourished, NAD  HEENT: OP clear, mucus membranes  moist  SKIN: warm, dry. No rashes.  Neuro: No focal deficits  Musculoskeletal: Muscle strength 5/5 all ext  Psychiatric: Mood and affect normal  Neck: No JVD, no carotid bruits, no thyromegaly, no lymphadenopathy.  Lungs:Clear bilaterally, no wheezes, rhonci, crackles  Cardiovascular: Regular rate and rhythm. Loud, harsh systolic murmur.  Abdomen:Soft. Bowel sounds present. Non-tender.  Extremities: No lower extremity edema. Pulses are 2 + in the bilateral DP/PT.   Plan: Moderately severe aortic stenosis: He likely has severe aortic stenosis. I have personally reviewed the echo images. The aortic valve is thickened, calcified with limited leaflet mobility but the valve does open. The AVA and dimensionless index suggest severe AS. Mean gradient is in a moderate range but with SVI of 29, there could be a component of low flow, low gradient AS. He may benefit from AVR and may be a good candidate for TAVR. His rapid atrial fibrillation likely contributed to his symptoms on presentation. Cannot excluded progression of CAD as well.   I have reviewed the natural history of aortic stenosis with the patient and their family members who are present today. We have discussed the limitations of medical therapy and the poor prognosis associated with symptomatic aortic stenosis. We have reviewed potential treatment options, including palliative medical therapy, conventional surgical aortic valve replacement, and transcatheter aortic valve replacement. We discussed treatment options in the context of the patient's specific comorbid medical conditions.   He would like to proceed with planning for TAVR. He will need a right and left heart cath when that is felt to be appropriate by the primary team in regards to his renal function. Will hold Eliquis tonight in anticipation of possible cardiac cath tomorrow. We can plan cardiac CT and CTA chest/abdomen and pelvis as his renal function allows after his cath. At this  time, he and his wife realize that further planning for potential TAVR will be based on his renal function. Will review his echo images tomorrow at our valve team meeting.   Lauree Chandler  09/22/2020  3:46 PM

## 2020-09-22 NOTE — Progress Notes (Signed)
Pt requesting something for sleep. Provider on call paged via amion. Eryn Krejci Marie, RN  

## 2020-09-23 ENCOUNTER — Encounter (HOSPITAL_COMMUNITY)
Admission: EM | Disposition: A | Discharge status: Home / Self Care | Payer: Self-pay | Source: Home / Self Care | Attending: Family Medicine

## 2020-09-23 DIAGNOSIS — I4811 Longstanding persistent atrial fibrillation: Secondary | ICD-10-CM | POA: Diagnosis not present

## 2020-09-23 DIAGNOSIS — I2510 Atherosclerotic heart disease of native coronary artery without angina pectoris: Secondary | ICD-10-CM

## 2020-09-23 DIAGNOSIS — I25110 Atherosclerotic heart disease of native coronary artery with unstable angina pectoris: Secondary | ICD-10-CM | POA: Diagnosis not present

## 2020-09-23 DIAGNOSIS — I5033 Acute on chronic diastolic (congestive) heart failure: Secondary | ICD-10-CM | POA: Diagnosis not present

## 2020-09-23 DIAGNOSIS — I350 Nonrheumatic aortic (valve) stenosis: Secondary | ICD-10-CM | POA: Diagnosis not present

## 2020-09-23 DIAGNOSIS — I25710 Atherosclerosis of autologous vein coronary artery bypass graft(s) with unstable angina pectoris: Secondary | ICD-10-CM

## 2020-09-23 HISTORY — PX: CORONARY STENT INTERVENTION: CATH118234

## 2020-09-23 HISTORY — PX: RIGHT/LEFT HEART CATH AND CORONARY/GRAFT ANGIOGRAPHY: CATH118267

## 2020-09-23 LAB — GLUCOSE, CAPILLARY
Glucose-Capillary: 168 mg/dL — ABNORMAL HIGH (ref 70–99)
Glucose-Capillary: 154 mg/dL — ABNORMAL HIGH (ref 70–99)
Glucose-Capillary: 94 mg/dL (ref 70–99)
Glucose-Capillary: 180 mg/dL — ABNORMAL HIGH (ref 70–99)

## 2020-09-23 LAB — COMPREHENSIVE METABOLIC PANEL
ALT: 44 U/L (ref 0–44)
AST: 26 U/L (ref 15–41)
Albumin: 3 g/dL — ABNORMAL LOW (ref 3.5–5.0)
Alkaline Phosphatase: 81 U/L (ref 38–126)
Anion gap: 12 (ref 5–15)
BUN: 27 mg/dL — ABNORMAL HIGH (ref 8–23)
CO2: 24 mmol/L (ref 22–32)
Calcium: 9.5 mg/dL (ref 8.9–10.3)
Chloride: 98 mmol/L (ref 98–111)
Creatinine, Ser: 1.91 mg/dL — ABNORMAL HIGH (ref 0.61–1.24)
GFR, Estimated: 34 mL/min — ABNORMAL LOW (ref >60)
Glucose, Bld: 152 mg/dL — ABNORMAL HIGH (ref 70–99)
Potassium: 4 mmol/L (ref 3.5–5.1)
Sodium: 134 mmol/L — ABNORMAL LOW (ref 135–145)
Total Bilirubin: 1.7 mg/dL — ABNORMAL HIGH (ref 0.3–1.2)
Total Protein: 6.7 g/dL (ref 6.5–8.1)

## 2020-09-23 LAB — CBC WITH DIFFERENTIAL/PLATELET
Abs Immature Granulocytes: 0.04 10*3/uL (ref 0.00–0.07)
Basophils Absolute: 0 10*3/uL (ref 0.0–0.1)
Basophils Relative: 0 %
Eosinophils Absolute: 0.5 10*3/uL (ref 0.0–0.5)
Eosinophils Relative: 5 %
HCT: 35.8 % — ABNORMAL LOW (ref 39.0–52.0)
Hemoglobin: 12.5 g/dL — ABNORMAL LOW (ref 13.0–17.0)
Immature Granulocytes: 0 %
Lymphocytes Relative: 10 %
Lymphs Abs: 1 10*3/uL (ref 0.7–4.0)
MCH: 30.6 pg (ref 26.0–34.0)
MCHC: 34.9 g/dL (ref 30.0–36.0)
MCV: 87.5 fL (ref 80.0–100.0)
Monocytes Absolute: 0.9 10*3/uL (ref 0.1–1.0)
Monocytes Relative: 9 %
Neutro Abs: 7.5 10*3/uL (ref 1.7–7.7)
Neutrophils Relative %: 76 %
Platelets: 173 10*3/uL (ref 150–400)
RBC: 4.09 MIL/uL — ABNORMAL LOW (ref 4.22–5.81)
RDW: 12.2 % (ref 11.5–15.5)
WBC: 9.9 10*3/uL (ref 4.0–10.5)
nRBC: 0 % (ref 0.0–0.2)

## 2020-09-23 LAB — POCT I-STAT EG7
Acid-Base Excess: 2 mmol/L (ref 0.0–2.0)
Bicarbonate: 26.7 mmol/L (ref 20.0–28.0)
Calcium, Ion: 1.25 mmol/L (ref 1.15–1.40)
HCT: 36 % — ABNORMAL LOW (ref 39.0–52.0)
Hemoglobin: 12.2 g/dL — ABNORMAL LOW (ref 13.0–17.0)
O2 Saturation: 59 %
Potassium: 3.7 mmol/L (ref 3.5–5.1)
Sodium: 137 mmol/L (ref 135–145)
TCO2: 28 mmol/L (ref 22–32)
pCO2, Ven: 43.2 mmHg — ABNORMAL LOW (ref 44.0–60.0)
pH, Ven: 7.4 (ref 7.250–7.430)
pO2, Ven: 31 mmHg — CL (ref 32.0–45.0)

## 2020-09-23 LAB — POCT I-STAT 7, (LYTES, BLD GAS, ICA,H+H)
Acid-Base Excess: 2 mmol/L (ref 0.0–2.0)
Bicarbonate: 26.1 mmol/L (ref 20.0–28.0)
Calcium, Ion: 1.27 mmol/L (ref 1.15–1.40)
HCT: 35 % — ABNORMAL LOW (ref 39.0–52.0)
Hemoglobin: 11.9 g/dL — ABNORMAL LOW (ref 13.0–17.0)
O2 Saturation: 99 %
Potassium: 3.8 mmol/L (ref 3.5–5.1)
Sodium: 136 mmol/L (ref 135–145)
TCO2: 27 mmol/L (ref 22–32)
pCO2 arterial: 39 mmHg (ref 32.0–48.0)
pH, Arterial: 7.433 (ref 7.350–7.450)
pO2, Arterial: 132 mmHg — ABNORMAL HIGH (ref 83.0–108.0)

## 2020-09-23 LAB — POCT ACTIVATED CLOTTING TIME: Activated Clotting Time: 380 seconds

## 2020-09-23 LAB — PHOSPHORUS: Phosphorus: 3.1 mg/dL (ref 2.5–4.6)

## 2020-09-23 LAB — MAGNESIUM: Magnesium: 1.9 mg/dL (ref 1.7–2.4)

## 2020-09-23 SURGERY — RIGHT/LEFT HEART CATH AND CORONARY/GRAFT ANGIOGRAPHY
Anesthesia: LOCAL

## 2020-09-23 MED ORDER — BIVALIRUDIN TRIFLUOROACETATE 250 MG IV SOLR
INTRAVENOUS | Status: AC
  Filled 2020-09-23: qty 250

## 2020-09-23 MED ORDER — LABETALOL HCL 5 MG/ML IV SOLN: 10.0000 mg | INTRAVENOUS | Status: AC | PRN

## 2020-09-23 MED ORDER — ASPIRIN 81 MG PO CHEW
81.0000 mg | CHEWABLE_TABLET | ORAL | Status: AC
  Administered 2020-09-23: 81 mg via ORAL
  Filled 2020-09-23: qty 1

## 2020-09-23 MED ORDER — LIDOCAINE HCL (PF) 1 % IJ SOLN
INTRAMUSCULAR | Status: AC
  Filled 2020-09-23: qty 30

## 2020-09-23 MED ORDER — SODIUM CHLORIDE 0.9 % IV SOLN: INTRAVENOUS | Status: DC

## 2020-09-23 MED ORDER — BIVALIRUDIN BOLUS VIA INFUSION - CUPID
INTRAVENOUS | Status: DC | PRN
  Administered 2020-09-23: 57.075 mg via INTRAVENOUS

## 2020-09-23 MED ORDER — FENTANYL CITRATE (PF) 100 MCG/2ML IJ SOLN
INTRAMUSCULAR | Status: AC
  Filled 2020-09-23: qty 2

## 2020-09-23 MED ORDER — HEPARIN (PORCINE) IN NACL 1000-0.9 UT/500ML-% IV SOLN
INTRAVENOUS | Status: AC
  Filled 2020-09-23: qty 1000

## 2020-09-23 MED ORDER — IOHEXOL 350 MG/ML SOLN
INTRAVENOUS | Status: DC | PRN
  Administered 2020-09-23: 40 mL via INTRACARDIAC

## 2020-09-23 MED ORDER — SODIUM CHLORIDE 0.9% FLUSH
3.0000 mL | Freq: Two times a day (BID) | INTRAVENOUS | Status: DC
  Administered 2020-09-24: 3 mL via INTRAVENOUS

## 2020-09-23 MED ORDER — MIDAZOLAM HCL 2 MG/2ML IJ SOLN
INTRAMUSCULAR | Status: AC
  Filled 2020-09-23: qty 2

## 2020-09-23 MED ORDER — SODIUM CHLORIDE 0.9 % WEIGHT BASED INFUSION
1.0000 mL/kg/h | INTRAVENOUS | Status: AC
  Administered 2020-09-23: 1 mL/kg/h via INTRAVENOUS

## 2020-09-23 MED ORDER — HYDRALAZINE HCL 20 MG/ML IJ SOLN: 10.0000 mg | INTRAMUSCULAR | Status: AC | PRN

## 2020-09-23 MED ORDER — SODIUM CHLORIDE 0.9% FLUSH: 3.0000 mL | INTRAVENOUS | Status: DC | PRN

## 2020-09-23 MED ORDER — APIXABAN 5 MG PO TABS: 5.0000 mg | ORAL_TABLET | Freq: Two times a day (BID) | ORAL | Status: DC

## 2020-09-23 MED ORDER — SODIUM CHLORIDE 0.9 % IV SOLN: 250.0000 mL | INTRAVENOUS | Status: DC | PRN

## 2020-09-23 MED ORDER — IOHEXOL 350 MG/ML SOLN
INTRAVENOUS | Status: AC
  Filled 2020-09-23: qty 1

## 2020-09-23 MED ORDER — MIDAZOLAM HCL 2 MG/2ML IJ SOLN
INTRAMUSCULAR | Status: DC | PRN
  Administered 2020-09-23: 1 mg via INTRAVENOUS

## 2020-09-23 MED ORDER — FENTANYL CITRATE (PF) 100 MCG/2ML IJ SOLN
INTRAMUSCULAR | Status: DC | PRN
  Administered 2020-09-23: 25 ug via INTRAVENOUS

## 2020-09-23 MED ORDER — SODIUM CHLORIDE 0.9 % IV SOLN
INTRAVENOUS | Status: AC | PRN
  Administered 2020-09-23: 100 mL/h via INTRAVENOUS

## 2020-09-23 MED ORDER — LIDOCAINE HCL (PF) 1 % IJ SOLN
INTRAMUSCULAR | Status: DC | PRN
  Administered 2020-09-23: 15 mL

## 2020-09-23 MED ORDER — SODIUM CHLORIDE 0.9% FLUSH
3.0000 mL | Freq: Two times a day (BID) | INTRAVENOUS | Status: DC
  Administered 2020-09-23 – 2020-09-24 (×2): 3 mL via INTRAVENOUS

## 2020-09-23 MED ORDER — SODIUM CHLORIDE 0.9% FLUSH
3.0000 mL | INTRAVENOUS | Status: DC | PRN
  Administered 2020-09-23: 3 mL via INTRAVENOUS

## 2020-09-23 MED ORDER — SODIUM CHLORIDE 0.9 % IV SOLN
INTRAVENOUS | Status: DC | PRN
  Administered 2020-09-23: 1 mg/kg/h via INTRAVENOUS

## 2020-09-23 SURGICAL SUPPLY — 25 items
BALLN SAPPHIRE 2.5X15 (BALLOONS) ×2
BALLN SAPPHIRE ~~LOC~~ 3.0X10 (BALLOONS) ×1 IMPLANT
BALLN SAPPHIRE ~~LOC~~ 3.5X12 (BALLOONS) ×1 IMPLANT
BALLOON SAPPHIRE 2.5X15 (BALLOONS) IMPLANT
CATH INFINITI 5 FR AL2 (CATHETERS) ×1 IMPLANT
CATH INFINITI 5 FR IM (CATHETERS) ×1 IMPLANT
CATH INFINITI 5FR AL1 (CATHETERS) ×1 IMPLANT
CATH INFINITI 5FR MULTPACK ANG (CATHETERS) ×1 IMPLANT
CATH LAUNCHER 6FR AL1 (CATHETERS) IMPLANT
CATH SWAN GANZ 7F STRAIGHT (CATHETERS) ×1 IMPLANT
CATHETER LAUNCHER 6FR AL1 (CATHETERS) ×2
CLOSURE PERCLOSE PROSTYLE (VASCULAR PRODUCTS) ×2 IMPLANT
KIT ENCORE 26 ADVANTAGE (KITS) ×1 IMPLANT
KIT HEART LEFT (KITS) ×2 IMPLANT
KIT HEMO VALVE WATCHDOG (MISCELLANEOUS) ×1 IMPLANT
PACK CARDIAC CATHETERIZATION (CUSTOM PROCEDURE TRAY) ×2 IMPLANT
SHEATH PINNACLE 5F 10CM (SHEATH) ×1 IMPLANT
SHEATH PINNACLE 6F 10CM (SHEATH) ×1 IMPLANT
SHEATH PINNACLE 7F 10CM (SHEATH) ×1 IMPLANT
SHEATH PROBE COVER 6X72 (BAG) ×1 IMPLANT
TRANSDUCER W/STOPCOCK (MISCELLANEOUS) ×2 IMPLANT
TUBING CIL FLEX 10 FLL-RA (TUBING) ×2 IMPLANT
WIRE COUGAR XT STRL 190CM (WIRE) ×1 IMPLANT
WIRE EMERALD 3MM-J .035X150CM (WIRE) ×1 IMPLANT
WIRE EMERALD ST .035X150CM (WIRE) ×1 IMPLANT

## 2020-09-23 NOTE — Progress Notes (Signed)
Progress Note  Patient Name: [name] Date of Encounter: 09/23/2020  Primary Cardiologist: [name], MD  Subjective   Pt has been seen by valve team  Scheduled for cath today    Inpatient Medications    Scheduled Meds: . amLODipine  5 mg Oral Daily  . carvedilol  6.25 mg Oral BID WC  . clopidogrel  75 mg Oral Daily  . docusate sodium  100 mg Oral QHS  . fluticasone furoate-vilanterol  1 puff Inhalation Daily  . insulin aspart  0-9 Units Subcutaneous TID WC  . linagliptin  5 mg Oral Daily  . nitroGLYCERIN  0.4 mg Sublingual Once  . pantoprazole  40 mg Oral Daily  . rosuvastatin  20 mg Oral Daily  . sodium chloride flush  3 mL Intravenous Q12H  . sodium chloride flush  3 mL Intravenous Q12H   Continuous Infusions: . sodium chloride     PRN Meds: sodium chloride, acetaminophen, fentaNYL (SUBLIMAZE) injection, guaiFENesin-dextromethorphan, hydrocortisone cream, levalbuterol, melatonin, ondansetron (ZOFRAN) IV, sodium chloride flush   Vital Signs    Vitals:   09/22/20 1618 09/22/20 2121 09/23/20 0620 09/23/20 0752  BP: (!) 141/67 140/84 135/82 132/78  Pulse: 69 70  71  Resp: 18 20 18    Temp: 98.7 F (37.1 C) 97.9 F (36.6 C) 98.4 F (36.9 C) 98.4 F (36.9 C)  TempSrc: Oral Axillary Oral Oral  SpO2: 96% 95% 95%   Weight:   76.1 kg   Height:        Intake/Output Summary (Last 24 hours) at 09/23/2020 0859 Last data filed at 09/23/2020 0752 Gross per 24 hour  Intake 483 ml  Output 575 ml  Net -92 ml   Last 3 Weights 09/23/2020 09/22/2020 09/21/2020  Weight (lbs) 167 lb 11.2 oz 167 lb 15.9 oz 169 lb 8.5 oz  Weight (kg) 76.068 kg 76.2 kg 76.9 kg     Telemetry    Afib , BBB,  HR 65-70s  Physical Exam   Physical Exam: Blood pressure 132/78, pulse 71, temperature 98.4 F (36.9 C), temperature source Oral, resp. rate 18, height 5\' 9"  (1.753 m), weight 76.1 kg, SpO2 95 %.  GEN:   Elderly male HEENT: Normal NECK: No JVD; No carotid  bruits LYMPHATICS: No lymphadenopathy CARDIAC:  Irreg.   2/6 systolic murmur  RESPIRATORY:  Clear to auscultation without rales, wheezing or rhonchi  ABDOMEN: Soft, non-tender, non-distended MUSCULOSKELETAL:  No edema; No deformity  SKIN: Warm and dry NEUROLOGIC:  Alert and oriented x 3   Labs    High Sensitivity Troponin:   Recent Labs  Lab 09/07/20 1422 09/07/20 1619 09/07/20 2212 09/20/20 0843 09/20/20 1130  TROPONINIHS 79* 89* 99* 67* 103*      Cardiac EnzymesNo results for input(s): TROPONINI in the last 168 hours. No results for input(s): TROPIPOC in the last 168 hours.   Chemistry Recent Labs  Lab 09/21/20 0329 09/21/20 0329 09/21/20 1423 09/22/20 0400 09/23/20 0236  NA 132*  --   --  134* 134*  K 3.3*   < > 4.0 3.9 4.0  CL 96*  --   --  98 98  CO2 24  --   --  24 24  GLUCOSE 181*  --   --  150* 152*  BUN 25*  --   --  29* 27*  CREATININE 1.94*  --   --  2.13* 1.91*  CALCIUM 9.4  --   --  9.3 9.5  PROT 6.7  --   --  6.7 6.7  ALBUMIN 3.0*  --   --  3.0* 3.0*  AST 14*  --   --  24 26  ALT 25  --   --  37 44  ALKPHOS 72  --   --  72 81  BILITOT 1.8*  --   --  1.4* 1.7*  GFRNONAA 33*  --   --  30* 34*  ANIONGAP 12  --   --  12 12   < > = values in this interval not displayed.     Hematology Recent Labs  Lab 09/21/20 0329 09/22/20 0400 09/23/20 0236  WBC 11.4* 11.2* 9.9  RBC 4.18* 4.12* 4.09*  HGB 12.8* 12.4* 12.5*  HCT 36.3* 36.4* 35.8*  MCV 86.8 88.3 87.5  MCH 30.6 30.1 30.6  MCHC 35.3 34.1 34.9  RDW 12.2 12.1 12.2  PLT 142* 153 173    BNP Recent Labs  Lab 09/20/20 1130  BNP 685.4*     DDimer  Recent Labs  Lab 09/20/20 1208  DDIMER 0.76*     Radiology    No results found.  Cardiac Studies   2D echo 09/08/20  1. Left ventricular ejection fraction, by estimation, is 50 to 55%. The  left ventricle has low normal function. The left ventricle demonstrates  regional wall motion abnormalities (see scoring diagram/findings for   description). There is severe left  ventricular hypertrophy. Left ventricular diastolic function could not be  evaluated. There is severe hypokinesis of the left ventricular, basal-mid  inferior wall.  2. Right ventricular systolic function is moderately reduced. The right  ventricular size is normal.  3. Right atrial size was mildly dilated.  4. The mitral valve is abnormal. Trivial mitral valve regurgitation.  5. The aortic valve is calcified. Aortic valve regurgitation is not  visualized. Moderate to severe aortic valve stenosis. Aortic valve area,  by VTI measures 0.87 cm. Aortic valve mean gradient measures 22.0 mmHg.  Aortic valve Vmax measures 2.99 m/s. DI  of 0.25.  6. Aortic dilatation noted. There is mild dilatation of the ascending  aorta, measuring 40 mm.  7. The inferior vena cava is dilated in size with <50% respiratory  variability, suggesting right atrial pressure of 15 mmHg.   Comparison(s): Changes from prior study are noted. 07/27/2019: LVEF 60-65%,  severe LVH, mild AS - peak and mean gradients of 15/26 mmHg.  Patient Profile     86 y.o. male with CAD s/p prior CABG with subsequent PCIs (PTCA 2015, DES to SVG-OM 2016), persistent Af, DM2, HTN, HLD, LBBB, CKD stage 3-4, chronic combined CHF with moderate-severe aortic stenosis, prostate CA, carotid artery disease (9-67% RICA, 89-38% LICA in 10/173), gastric ulcer, arthritis. Per notes he has had recurrent afib despite DCCV and rate control. He was recently admitted 08/2020 with CHF/COPD exacerbation and fever. Had significant improvement with steroids. Outpatient stress test was planned for low/flat troponin.    He presented back to the ED 09/20/2020 with worsening orthopnea, PND, low grade fever, heart pounding and chest discomfort. Started on amiodarone for persistent afib.  Assessment & Plan    1. Recurrent chest pain/shortness of breath in context of known CAD s/p prior CABG/PCIs and aortic  stenosis Creatinine is slightly better. Is scheduled for cath today   2. Aortic stenosis Moderate - severe.  I suspect he has low output / low gradient severe AS   3. Persistent atrial fibrillation  off Eliquis for now   4. Acute on chronic diastolic CHF, complicated by CKD  stage III-IV Volume status appears about normal      For questions or updates, please contact [place] Please consult [email or web address] for contact info under Cardiology/STEMI.  Signed, Mertie Moores, MD 09/23/2020, 8:59 AM

## 2020-09-23 NOTE — Progress Notes (Signed)
PT Cancellation Note  Patient Details Name: [name] MRN: [id number] DOB: 09/30/1933   Cancelled Treatment:    Reason Eval/Treat Not Completed: Patient at procedure or test/unavailable. Pt down for heart cath. Will follow up tomorrow for continued PT and pre-TAVR assessment.    Shary Decamp Maycok 09/23/2020, 3:08 PM  Kamariyah Timberlake [place] Pager [phone] Office [phone]

## 2020-09-23 NOTE — Plan of Care (Signed)
  Problem: Clinical Measurements: Goal: Ability to maintain clinical measurements within normal limits will improve Outcome: Progressing Goal: Respiratory complications will improve Outcome: Progressing   Problem: Coping: Goal: Level of anxiety will decrease Outcome: Progressing   Problem: Nutrition: Goal: Adequate nutrition will be maintained Outcome: Completed/Met   Problem: Elimination: Goal: Will not experience complications related to urinary retention Outcome: Completed/Met   Problem: Pain Managment: Goal: General experience of comfort will improve Outcome: Completed/Met

## 2020-09-23 NOTE — H&P (View-Only) (Signed)
Progress Note  Patient Name: [name] Date of Encounter: 09/23/2020  Primary Cardiologist: [name], MD  Subjective   Pt has been seen by valve team  Scheduled for cath today    Inpatient Medications    Scheduled Meds: . amLODipine  5 mg Oral Daily  . carvedilol  6.25 mg Oral BID WC  . clopidogrel  75 mg Oral Daily  . docusate sodium  100 mg Oral QHS  . fluticasone furoate-vilanterol  1 puff Inhalation Daily  . insulin aspart  0-9 Units Subcutaneous TID WC  . linagliptin  5 mg Oral Daily  . nitroGLYCERIN  0.4 mg Sublingual Once  . pantoprazole  40 mg Oral Daily  . rosuvastatin  20 mg Oral Daily  . sodium chloride flush  3 mL Intravenous Q12H  . sodium chloride flush  3 mL Intravenous Q12H   Continuous Infusions: . sodium chloride     PRN Meds: sodium chloride, acetaminophen, fentaNYL (SUBLIMAZE) injection, guaiFENesin-dextromethorphan, hydrocortisone cream, levalbuterol, melatonin, ondansetron (ZOFRAN) IV, sodium chloride flush   Vital Signs    Vitals:   09/22/20 1618 09/22/20 2121 09/23/20 0620 09/23/20 0752  BP: (!) 141/67 140/84 135/82 132/78  Pulse: 69 70  71  Resp: 18 20 18    Temp: 98.7 F (37.1 C) 97.9 F (36.6 C) 98.4 F (36.9 C) 98.4 F (36.9 C)  TempSrc: Oral Axillary Oral Oral  SpO2: 96% 95% 95%   Weight:   76.1 kg   Height:        Intake/Output Summary (Last 24 hours) at 09/23/2020 0859 Last data filed at 09/23/2020 0752 Gross per 24 hour  Intake 483 ml  Output 575 ml  Net -92 ml   Last 3 Weights 09/23/2020 09/22/2020 09/21/2020  Weight (lbs) 167 lb 11.2 oz 167 lb 15.9 oz 169 lb 8.5 oz  Weight (kg) 76.068 kg 76.2 kg 76.9 kg     Telemetry    Afib , BBB,  HR 65-70s  Physical Exam   Physical Exam: Blood pressure 132/78, pulse 71, temperature 98.4 F (36.9 C), temperature source Oral, resp. rate 18, height 5\' 9"  (1.753 m), weight 76.1 kg, SpO2 95 %.  GEN:   Elderly male HEENT: Normal NECK: No JVD; No carotid  bruits LYMPHATICS: No lymphadenopathy CARDIAC:  Irreg.   2/6 systolic murmur  RESPIRATORY:  Clear to auscultation without rales, wheezing or rhonchi  ABDOMEN: Soft, non-tender, non-distended MUSCULOSKELETAL:  No edema; No deformity  SKIN: Warm and dry NEUROLOGIC:  Alert and oriented x 3   Labs    High Sensitivity Troponin:   Recent Labs  Lab 09/07/20 1422 09/07/20 1619 09/07/20 2212 09/20/20 0843 09/20/20 1130  TROPONINIHS 79* 89* 99* 67* 103*      Cardiac EnzymesNo results for input(s): TROPONINI in the last 168 hours. No results for input(s): TROPIPOC in the last 168 hours.   Chemistry Recent Labs  Lab 09/21/20 0329 09/21/20 0329 09/21/20 1423 09/22/20 0400 09/23/20 0236  NA 132*  --   --  134* 134*  K 3.3*   < > 4.0 3.9 4.0  CL 96*  --   --  98 98  CO2 24  --   --  24 24  GLUCOSE 181*  --   --  150* 152*  BUN 25*  --   --  29* 27*  CREATININE 1.94*  --   --  2.13* 1.91*  CALCIUM 9.4  --   --  9.3 9.5  PROT 6.7  --   --  6.7 6.7  ALBUMIN 3.0*  --   --  3.0* 3.0*  AST 14*  --   --  24 26  ALT 25  --   --  37 44  ALKPHOS 72  --   --  72 81  BILITOT 1.8*  --   --  1.4* 1.7*  GFRNONAA 33*  --   --  30* 34*  ANIONGAP 12  --   --  12 12   < > = values in this interval not displayed.     Hematology Recent Labs  Lab 09/21/20 0329 09/22/20 0400 09/23/20 0236  WBC 11.4* 11.2* 9.9  RBC 4.18* 4.12* 4.09*  HGB 12.8* 12.4* 12.5*  HCT 36.3* 36.4* 35.8*  MCV 86.8 88.3 87.5  MCH 30.6 30.1 30.6  MCHC 35.3 34.1 34.9  RDW 12.2 12.1 12.2  PLT 142* 153 173    BNP Recent Labs  Lab 09/20/20 1130  BNP 685.4*     DDimer  Recent Labs  Lab 09/20/20 1208  DDIMER 0.76*     Radiology    No results found.  Cardiac Studies   2D echo 09/08/20  1. Left ventricular ejection fraction, by estimation, is 50 to 55%. The  left ventricle has low normal function. The left ventricle demonstrates  regional wall motion abnormalities (see scoring diagram/findings for   description). There is severe left  ventricular hypertrophy. Left ventricular diastolic function could not be  evaluated. There is severe hypokinesis of the left ventricular, basal-mid  inferior wall.  2. Right ventricular systolic function is moderately reduced. The right  ventricular size is normal.  3. Right atrial size was mildly dilated.  4. The mitral valve is abnormal. Trivial mitral valve regurgitation.  5. The aortic valve is calcified. Aortic valve regurgitation is not  visualized. Moderate to severe aortic valve stenosis. Aortic valve area,  by VTI measures 0.87 cm. Aortic valve mean gradient measures 22.0 mmHg.  Aortic valve Vmax measures 2.99 m/s. DI  of 0.25.  6. Aortic dilatation noted. There is mild dilatation of the ascending  aorta, measuring 40 mm.  7. The inferior vena cava is dilated in size with <50% respiratory  variability, suggesting right atrial pressure of 15 mmHg.   Comparison(s): Changes from prior study are noted. 07/27/2019: LVEF 60-65%,  severe LVH, mild AS - peak and mean gradients of 15/26 mmHg.  Patient Profile     86 y.o. male with CAD s/p prior CABG with subsequent PCIs (PTCA 2015, DES to SVG-OM 2016), persistent Af, DM2, HTN, HLD, LBBB, CKD stage 3-4, chronic combined CHF with moderate-severe aortic stenosis, prostate CA, carotid artery disease (8-36% RICA, 62-94% LICA in 04/6545), gastric ulcer, arthritis. Per notes he has had recurrent afib despite DCCV and rate control. He was recently admitted 08/2020 with CHF/COPD exacerbation and fever. Had significant improvement with steroids. Outpatient stress test was planned for low/flat troponin.    He presented back to the ED 09/20/2020 with worsening orthopnea, PND, low grade fever, heart pounding and chest discomfort. Started on amiodarone for persistent afib.  Assessment & Plan    1. Recurrent chest pain/shortness of breath in context of known CAD s/p prior CABG/PCIs and aortic  stenosis Creatinine is slightly better. Is scheduled for cath today   2. Aortic stenosis Moderate - severe.  I suspect he has low output / low gradient severe AS   3. Persistent atrial fibrillation  off Eliquis for now   4. Acute on chronic diastolic CHF, complicated by CKD  stage III-IV Volume status appears about normal      For questions or updates, please contact [place] Please consult [email or web address] for contact info under Cardiology/STEMI.  Signed, Mertie Moores, MD 09/23/2020, 8:59 AM

## 2020-09-23 NOTE — Progress Notes (Signed)
  [facility]   MULTIDISCIPLINARY HEART VALVE TEAM  Case discussed with multidisciplinary valve team today who agree and he has moderately severe AS. Plan to start with [facility] today with Dr. [place]. Last dose of eliquis yesterday AM. Creat improved from yesterday and close to his baseline.  I have reviewed the risks, indications, and alternatives to cardiac catheterization and possible angioplasty/stenting with the patient. Risks include but are not limited to bleeding, infection, vascular injury, stroke, myocardial infection, arrhythmia, kidney injury, radiation-related injury in the case of prolonged fluoroscopy use, emergency cardiac surgery, and death. The patient understands the risks of serious complication is low (<9%).     Angelena Form PA-C  [facility]  Pager [phone]

## 2020-09-23 NOTE — Progress Notes (Signed)
PROGRESS NOTE   [name]  YQM:[id number] DOB: 21-Oct-1932 DOA: 09/20/2020 PCP: [name], MD  Brief Narrative:  86 year old white male CABG 6 4 stent X5 with stents and in-stent stenosis HFpEF, HTN, paroxysmal A. fib on Eliquis, DM TY 2 with nephropathy, prostate cancer, COPD, prior pancreatitis  Recent hospitalization 11/21--11/22 DOE, COPD-Rx for COPD, CHF-repeat echo showed progression of aortic stenosis which might of contributed-was discharged home but did not tolerate steroids or not did not use and did not use Omnicef  Admit 12/4 SOB fever CP-spiked fever 102-developed pressure-like chest pain palpitations severe shortness of breath took nitroglycerin Came in with shortness of breath and acute on diastolic heart failure Planning for cardiac cath 12/7 and then determine next steps  Assessment & Plan:   Active Problems:   HTN (hypertension)   CAD in native artery   LBBB (left bundle branch block)   PAF (paroxysmal atrial fibrillation) (HCC)   CKD (chronic kidney disease), stage III (HCC)   Acute on chronic diastolic CHF (congestive heart failure) (HCC)   Persistent atrial fibrillation (HCC)   Angina at rest [facility])   Chronic diastolic (congestive) heart failure (HCC)   Aortic stenosis, moderate   1. Decompensated HFpEF probably secondary to severe AAS a. Still has JVD on exam b. Lasix/Aldactone held by cardiology secondary to creatinine increase c. Cardiac cath planned for 12/7 2. CAD a. Still complaining of vague chest pain says it was much greater prior to admission b. Continue Coreg 6.25 twice daily c. Continue aspirin 81, Plavix 75 daily 3. Paroxysmal A. Fib a. Was on amiodarone earlier in admission but discontinued secondary to bradycardia b. Continue Coreg as above c. Patient remains in A. fib with PVCs-continue Eliquis 2.5 twice daily 4. HTN a. Amlodipine 5 in addition to above 5. COPD 6. Recently treated pneumonia a. Leukocytosis improved on its  own b. Procalcitonin low c.  no sputum-hold further antibiotics at this time d. Last fever 100.212/4 7. CKD 3B, probable cardiorenal syndrome a. Defer diuresis to cardiology at this time b. May be cautious and have to watch creatinine over the next day to day and a half after catheterization 8. DM TY 2 a. CBG ranging 154-1 68 on linagliptin and sliding scale   DVT prophylaxis: Eliquis Code Status: Full Family Communication: Discussed with wife at bedside in detail Disposition:  Status is: Inpatient-awaiting cardiac cath and then need to watch kidney function prior to discharge likely can go home  Remains inpatient appropriate because:Hemodynamically unstable, Ongoing active pain requiring inpatient pain management and Altered mental status   Dispo: The patient is from: Home              Anticipated d/c is to: Home              Anticipated d/c date is: 2 days              Patient currently is not medically stable to d/c.       Consultants:   Cardiology  Procedures: None  Antimicrobials: None currently   Subjective:  Doing fair sitting up in bed n.p.o. for procedure no chest pain although had a little bit of shortness of breath earlier this morning Does not feel swollen  Objective: Vitals:   09/22/20 1618 09/22/20 2121 09/23/20 0620 09/23/20 0752  BP: (!) 141/67 140/84 135/82 132/78  Pulse: 69 70  71  Resp: 18 20 18    Temp: 98.7 F (37.1 C) 97.9 F (36.6 C) 98.4 F (36.9  C) 98.4 F (36.9 C)  TempSrc: Oral Axillary Oral Oral  SpO2: 96% 95% 95%   Weight:   76.1 kg   Height:        Intake/Output Summary (Last 24 hours) at 09/23/2020 1327 Last data filed at 09/23/2020 6606 Gross per 24 hour  Intake 480 ml  Output 400 ml  Net 80 ml   Filed Weights   09/21/20 0457 09/22/20 0420 09/23/20 0620  Weight: 76.9 kg 76.2 kg 76.1 kg    Examination:   EOMI NCAT no focal deficit S1-S2 no murmur no rub no gallop sinus, sinus bradycardia on monitors CTA B mild  wheeze Chest clear otherwise no added sound Abdomen soft no rebound no guarding  Data Reviewed: I have personally reviewed following labs and imaging studies BUNs/creatinine 25/1.9-->29/2.1-->27/1.9 Albumin 3.0 Bilirubin 1.7 WBC 14.3-->11.2-->9.9  Radiology Studies: No results found.   Scheduled Meds: . amLODipine  5 mg Oral Daily  . carvedilol  6.25 mg Oral BID WC  . clopidogrel  75 mg Oral Daily  . docusate sodium  100 mg Oral QHS  . fluticasone furoate-vilanterol  1 puff Inhalation Daily  . insulin aspart  0-9 Units Subcutaneous TID WC  . linagliptin  5 mg Oral Daily  . nitroGLYCERIN  0.4 mg Sublingual Once  . pantoprazole  40 mg Oral Daily  . rosuvastatin  20 mg Oral Daily  . sodium chloride flush  3 mL Intravenous Q12H  . sodium chloride flush  3 mL Intravenous Q12H   Continuous Infusions: . sodium chloride    . sodium chloride    . sodium chloride       LOS: 3 days    Time spent: [place], MD [facility] To contact the attending provider between 7A-7P or the covering provider during after hours 7P-7A, please log into the web site [email or web address] and access using universal [place] password for that web site. If you do not have the password, please call the hospital operator.  09/23/2020, 1:27 PM

## 2020-09-23 NOTE — Interval H&P Note (Signed)
History and Physical Interval Note:  09/23/2020 2:17 PM  [name]  has presented today for surgery, with the diagnosis of aortic stenosis.  The various methods of treatment have been discussed with the patient and family. After consideration of risks, benefits and other options for treatment, the patient has consented to  Procedure(s): RIGHT/LEFT HEART CATH AND CORONARY/GRAFT ANGIOGRAPHY (N/A) as a surgical intervention.  The patient's history has been reviewed, patient examined, no change in status, stable for surgery.  I have reviewed the patient's chart and labs.  Questions were answered to the patient's satisfaction.     Sherren Mocha

## 2020-09-24 ENCOUNTER — Encounter (HOSPITAL_COMMUNITY): Payer: Self-pay | Admitting: Cardiovascular Disease

## 2020-09-24 DIAGNOSIS — I350 Nonrheumatic aortic (valve) stenosis: Secondary | ICD-10-CM | POA: Diagnosis not present

## 2020-09-24 DIAGNOSIS — I4819 Other persistent atrial fibrillation: Secondary | ICD-10-CM | POA: Diagnosis not present

## 2020-09-24 DIAGNOSIS — I2510 Atherosclerotic heart disease of native coronary artery without angina pectoris: Secondary | ICD-10-CM | POA: Diagnosis not present

## 2020-09-24 DIAGNOSIS — I5033 Acute on chronic diastolic (congestive) heart failure: Secondary | ICD-10-CM | POA: Diagnosis not present

## 2020-09-24 LAB — CBC WITH DIFFERENTIAL/PLATELET
Abs Immature Granulocytes: 0.03 10*3/uL (ref 0.00–0.07)
Basophils Absolute: 0 10*3/uL (ref 0.0–0.1)
Basophils Relative: 0 %
Eosinophils Absolute: 0.3 10*3/uL (ref 0.0–0.5)
Eosinophils Relative: 4 %
HCT: 36.3 % — ABNORMAL LOW (ref 39.0–52.0)
Hemoglobin: 12.1 g/dL — ABNORMAL LOW (ref 13.0–17.0)
Immature Granulocytes: 0 %
Lymphocytes Relative: 9 %
Lymphs Abs: 0.8 10*3/uL (ref 0.7–4.0)
MCH: 29.5 pg (ref 26.0–34.0)
MCHC: 33.3 g/dL (ref 30.0–36.0)
MCV: 88.5 fL (ref 80.0–100.0)
Monocytes Absolute: 0.8 10*3/uL (ref 0.1–1.0)
Monocytes Relative: 9 %
Neutro Abs: 6.5 10*3/uL (ref 1.7–7.7)
Neutrophils Relative %: 78 %
Platelets: 161 10*3/uL (ref 150–400)
RBC: 4.1 MIL/uL — ABNORMAL LOW (ref 4.22–5.81)
RDW: 12.1 % (ref 11.5–15.5)
WBC: 8.5 10*3/uL (ref 4.0–10.5)
nRBC: 0 % (ref 0.0–0.2)

## 2020-09-24 LAB — COMPREHENSIVE METABOLIC PANEL
ALT: 41 U/L (ref 0–44)
AST: 21 U/L (ref 15–41)
Albumin: 2.8 g/dL — ABNORMAL LOW (ref 3.5–5.0)
Alkaline Phosphatase: 80 U/L (ref 38–126)
Anion gap: 12 (ref 5–15)
BUN: 22 mg/dL (ref 8–23)
CO2: 19 mmol/L — ABNORMAL LOW (ref 22–32)
Calcium: 9 mg/dL (ref 8.9–10.3)
Chloride: 100 mmol/L (ref 98–111)
Creatinine, Ser: 1.84 mg/dL — ABNORMAL HIGH (ref 0.61–1.24)
GFR, Estimated: 35 mL/min — ABNORMAL LOW (ref >60)
Glucose, Bld: 179 mg/dL — ABNORMAL HIGH (ref 70–99)
Potassium: 4.1 mmol/L (ref 3.5–5.1)
Sodium: 131 mmol/L — ABNORMAL LOW (ref 135–145)
Total Bilirubin: 1.6 mg/dL — ABNORMAL HIGH (ref 0.3–1.2)
Total Protein: 6.2 g/dL — ABNORMAL LOW (ref 6.5–8.1)

## 2020-09-24 LAB — PHOSPHORUS: Phosphorus: 3 mg/dL (ref 2.5–4.6)

## 2020-09-24 LAB — GLUCOSE, CAPILLARY
Glucose-Capillary: 218 mg/dL — ABNORMAL HIGH (ref 70–99)
Glucose-Capillary: 147 mg/dL — ABNORMAL HIGH (ref 70–99)

## 2020-09-24 LAB — MAGNESIUM: Magnesium: 1.9 mg/dL (ref 1.7–2.4)

## 2020-09-24 MED ORDER — FUROSEMIDE 40 MG PO TABS
40.0000 mg | ORAL_TABLET | Freq: Every day | ORAL | 3 refills | Status: DC
Start: 2020-09-24 — End: 2020-09-30

## 2020-09-24 MED ORDER — APIXABAN 2.5 MG PO TABS
2.5000 mg | ORAL_TABLET | Freq: Two times a day (BID) | ORAL | Status: DC
  Administered 2020-09-24: 2.5 mg via ORAL
  Filled 2020-09-24: qty 1

## 2020-09-24 NOTE — Progress Notes (Addendum)
Progress Note  Patient Name: [name] Date of Encounter: 09/24/2020  [facility] Cardiologist: [name], MD   Subjective   No chest pain, no SOB, lying almost flat  Inpatient Medications    Scheduled Meds: . amLODipine  5 mg Oral Daily  . apixaban  5 mg Oral BID  . carvedilol  6.25 mg Oral BID WC  . clopidogrel  75 mg Oral Daily  . docusate sodium  100 mg Oral QHS  . fluticasone furoate-vilanterol  1 puff Inhalation Daily  . insulin aspart  0-9 Units Subcutaneous TID WC  . linagliptin  5 mg Oral Daily  . nitroGLYCERIN  0.4 mg Sublingual Once  . pantoprazole  40 mg Oral Daily  . rosuvastatin  20 mg Oral Daily  . sodium chloride flush  3 mL Intravenous Q12H  . sodium chloride flush  3 mL Intravenous Q12H  . sodium chloride flush  3 mL Intravenous Q12H   Continuous Infusions: . sodium chloride    . sodium chloride     PRN Meds: sodium chloride, sodium chloride, acetaminophen, fentaNYL (SUBLIMAZE) injection, guaiFENesin-dextromethorphan, hydrocortisone cream, levalbuterol, melatonin, ondansetron (ZOFRAN) IV, sodium chloride flush, sodium chloride flush   Vital Signs    Vitals:   09/23/20 1833 09/23/20 1952 09/23/20 2337 09/24/20 0524  BP: 121/63 130/72 (!) 113/39 126/73  Pulse: (!) 47 72 70 74  Resp:  $Remo'19 20 18  'WCoUO$ Temp:  98.6 F (37 C) 98.8 F (37.1 C) 99 F (37.2 C)  TempSrc:  Oral Oral Oral  SpO2: 97% 99% 96% 98%  Weight:    76.9 kg  Height:        Intake/Output Summary (Last 24 hours) at 09/24/2020 0707 Last data filed at 09/24/2020 0530 Gross per 24 hour  Intake 1367.34 ml  Output 950 ml  Net 417.34 ml   Last 3 Weights 09/24/2020 09/23/2020 09/22/2020  Weight (lbs) 169 lb 8 oz 167 lb 11.2 oz 167 lb 15.9 oz  Weight (kg) 76.885 kg 76.068 kg 76.2 kg      Telemetry    Atrial fib at times brady - Personally Reviewed  ECG    Atrial fib with LBBB and no acute changes.  HR low at times - Personally Reviewed  Physical Exam   GEN: No acute  distress.   Neck: No JVD Cardiac: irreg irreg, no murmurs, rubs, or gallops. Rt groin with one area of mild bleeding.    Respiratory: Clear to auscultation bilaterally. GI: Soft, nontender, non-distended  MS: No edema; No deformity. Neuro:  Nonfocal  Psych: Normal affect   Labs    High Sensitivity Troponin:   Recent Labs  Lab 09/07/20 1422 09/07/20 1619 09/07/20 2212 09/20/20 0843 09/20/20 1130  TROPONINIHS 79* 89* 99* 67* 103*      Chemistry Recent Labs  Lab 09/22/20 0400 09/22/20 0400 09/23/20 0236 09/23/20 1436 09/24/20 0351  NA 134*   < > 134* 136  137 131*  K 3.9   < > 4.0 3.8  3.7 4.1  CL 98  --  98  --  100  CO2 24  --  24  --  19*  GLUCOSE 150*  --  152*  --  179*  BUN 29*  --  27*  --  22  CREATININE 2.13*  --  1.91*  --  1.84*  CALCIUM 9.3  --  9.5  --  9.0  PROT 6.7  --  6.7  --  6.2*  ALBUMIN 3.0*  --  3.0*  --  2.8*  AST 24  --  26  --  21  ALT 37  --  44  --  41  ALKPHOS 72  --  81  --  80  BILITOT 1.4*  --  1.7*  --  1.6*  GFRNONAA 30*  --  34*  --  35*  ANIONGAP 12  --  12  --  12   < > = values in this interval not displayed.     Hematology Recent Labs  Lab 09/22/20 0400 09/22/20 0400 09/23/20 0236 09/23/20 1436 09/24/20 0351  WBC 11.2*  --  9.9  --  8.5  RBC 4.12*  --  4.09*  --  4.10*  HGB 12.4*   < > 12.5* 11.9*  12.2* 12.1*  HCT 36.4*   < > 35.8* 35.0*  36.0* 36.3*  MCV 88.3  --  87.5  --  88.5  MCH 30.1  --  30.6  --  29.5  MCHC 34.1  --  34.9  --  33.3  RDW 12.1  --  12.2  --  12.1  PLT 153  --  173  --  161   < > = values in this interval not displayed.    BNP Recent Labs  Lab 09/20/20 1130  BNP 685.4*     DDimer  Recent Labs  Lab 09/20/20 1208  DDIMER 0.76*     Radiology    CARDIAC CATHETERIZATION  Result Date: 09/23/2020  Balloon angioplasty was performed using a BALLOON SAPPHIRE [place] 3.5X12.  Post intervention, there is a 20% residual stenosis.  1.  Severe native three-vessel coronary artery disease  2.  Status post aortocoronary bypass surgery with continued patency of the LIMA to LAD, saphenous vein graft to left posterior lateral branch, and saphenous vein graft to right PDA 3.  Severe in-stent restenosis in the saphenous vein graft to obtuse marginal (2 layers of stent), treated successfully with noncompliant balloon angioplasty (3.5 mm noncompliant balloon to 22 atm) 4.  Moderate aortic stenosis with mean gradient 14 mmHg, calculated aortic valve area 1.37 cm, moderate amount of difficulty crossing the valve with a straight wire, suspect component of paradoxical low flow low gradient aortic stenosis 5.  Normal diastolic filling pressures in the right and left heart Recommendations: Aspirin x30 days, clopidogrel 75 mg at least 1 month favor 6 months if tolerated, resume apixaban tomorrow Total contrast: 40 cc    Cardiac Studies   Cardiac cath PCI 09/23/20  Balloon angioplasty was performed using a BALLOON SAPPHIRE [place] 3.5X12.  Post intervention, there is a 20% residual stenosis.   1.  Severe native three-vessel coronary artery disease 2.  Status post aortocoronary bypass surgery with continued patency of the LIMA to LAD, saphenous vein graft to left posterior lateral branch, and saphenous vein graft to right PDA 3.  Severe in-stent restenosis in the saphenous vein graft to obtuse marginal (2 layers of stent), treated successfully with noncompliant balloon angioplasty (3.5 mm noncompliant balloon to 22 atm) 4.  Moderate aortic stenosis with mean gradient 14 mmHg, calculated aortic valve area 1.37 cm, moderate amount of difficulty crossing the valve with a straight wire, suspect component of paradoxical low flow low gradient aortic stenosis 5.  Normal diastolic filling pressures in the right and left heart  Recommendations: Aspirin x30 days, clopidogrel 75 mg at least 1 month favor 6 months if tolerated, resume apixaban tomorrow  Total contrast: 40 cc  Echo 09/08/20 IMPRESSIONS  1. Left ventricular ejection fraction, by estimation, is 50 to 55%. The  left ventricle has low normal function. The left ventricle demonstrates  regional wall motion abnormalities (see scoring diagram/findings for  description). There is severe left  ventricular hypertrophy. Left ventricular diastolic function could not be  evaluated. There is severe hypokinesis of the left ventricular, basal-mid  inferior wall.  2. Right ventricular systolic function is moderately reduced. The right  ventricular size is normal.  3. Right atrial size was mildly dilated.  4. The mitral valve is abnormal. Trivial mitral valve regurgitation.  5. The aortic valve is calcified. Aortic valve regurgitation is not  visualized. Moderate to severe aortic valve stenosis. Aortic valve area,  by VTI measures 0.87 cm. Aortic valve mean gradient measures 22.0 mmHg.  Aortic valve Vmax measures 2.99 m/s. DI  of 0.25.  6. Aortic dilatation noted. There is mild dilatation of the ascending  aorta, measuring 40 mm.  7. The inferior vena cava is dilated in size with <50% respiratory  variability, suggesting right atrial pressure of 15 mmHg.   Comparison(s): Changes from prior study are noted. 07/27/2019: LVEF 60-65%,  severe LVH, mild AS - peak and mean gradients of 15/26 mmHg.   FINDINGS  Left Ventricle: Left ventricular ejection fraction, by estimation, is 50  to 55%. The left ventricle has low normal function. The left ventricle  demonstrates regional wall motion abnormalities. Severe hypokinesis of the  left ventricular, basal-mid  inferior wall. The left ventricular internal cavity size was normal in  size. There is severe left ventricular hypertrophy. Abnormal (paradoxical)  septal motion, consistent with left bundle branch block. Left ventricular  diastolic function could not be  evaluated due to atrial fibrillation. Left ventricular diastolic function  could not be evaluated.   Right Ventricle:  The right ventricular size is normal. No increase in  right ventricular wall thickness. Right ventricular systolic function is  moderately reduced.   Left Atrium: Left atrial size was normal in size.   Right Atrium: Right atrial size was mildly dilated.   Pericardium: There is no evidence of pericardial effusion.   Mitral Valve: The mitral valve is abnormal. Mild to moderate mitral  annular calcification. Trivial mitral valve regurgitation.   Tricuspid Valve: The tricuspid valve is grossly normal. Tricuspid valve  regurgitation is not demonstrated.   Aortic Valve: The aortic valve is calcified. Aortic valve regurgitation is  not visualized. Moderate to severe aortic stenosis is present. Aortic  valve mean gradient measures 22.0 mmHg. Aortic valve peak gradient  measures 35.8 mmHg. Aortic valve area, by  VTI measures 0.87 cm.   Pulmonic Valve: The pulmonic valve was grossly normal. Pulmonic valve  regurgitation is trivial.   Aorta: Aortic dilatation noted. There is mild dilatation of the ascending  aorta, measuring 40 mm.   Venous: The inferior vena cava is dilated in size with less than 50%  respiratory variability, suggesting right atrial pressure of 15 mmHg.   IAS/Shunts: No atrial level shunt detected by color flow Doppler.     LEFT VENTRICLE  PLAX 2D  LVIDd:     3.80 cm  LVIDs:     3.15 cm  LV PW:     1.90 cm  LV IVS:    2.00 cm  LVOT diam:   2.10 cm  LV SV:     57  LV SV Index:  29  LVOT Area:   3.46 cm    LV Volumes (MOD)  LV vol d, MOD A2C: 115.0  ml  LV vol d, MOD A4C: 117.0 ml  LV vol s, MOD A2C: 47.4 ml  LV vol s, MOD A4C: 51.6 ml  LV SV MOD A2C:   67.6 ml  LV SV MOD A4C:   117.0 ml  LV SV MOD BP:   64.7 ml   RIGHT VENTRICLE      IVC  RV S prime:   5.12 cm/s IVC diam: 2.40 cm  TAPSE (M-mode): 0.7 cm   LEFT ATRIUM       Index    RIGHT ATRIUM      Index  LA diam:    5.00 cm 2.56 cm/m RA  Area:   21.30 cm  LA Vol (A2C):  57.7 ml 29.52 ml/m RA Volume:  67.00 ml 34.28 ml/m  LA Vol (A4C):  62.3 ml 31.88 ml/m  LA Biplane Vol: 62.0 ml 31.72 ml/m  AORTIC VALVE  AV Area (Vmax):  0.86 cm  AV Area (Vmean):  0.80 cm  AV Area (VTI):   0.87 cm  AV Vmax:      299.00 cm/s  AV Vmean:     226.000 cm/s  AV VTI:      0.659 m  AV Peak Grad:   35.8 mmHg  AV Mean Grad:   22.0 mmHg  LVOT Vmax:     74.20 cm/s  LVOT Vmean:    52.300 cm/s  LVOT VTI:     0.166 m  LVOT/AV VTI ratio: 0.25    AORTA  Ao Root diam: 3.30 cm  Ao Asc diam: 4.00 cm   MITRAL VALVE  MV Area (PHT): 4.52 cm   SHUNTS  MV Decel Time: 168 msec   Systemic VTI: 0.17 m  MV E velocity: 117.00 cm/s Systemic Diam: 2.10 cm   Patient Profile     86 y.o. male with CAD s/p prior CABG with subsequent PCIs (PTCA 2015, DES to SVG-OM 2016), persistent Af, DM2, HTN, HLD, LBBB, CKD stage 3-4, chronic combined CHF with moderate-severe aortic stenosis, prostate CA, carotid artery disease (8-56% RICA, 31-49% LICA in 04/262), gastric ulcer, arthritis. Per notes he has had recurrent afib despite DCCV and rate control. He was recently admitted 08/2020 with CHF/COPD exacerbation and fever. Had significant improvement with steroids. Outpatient stress test was planned for low/flat troponin.    He presented back to the ED 09/20/2020 with worsening orthopnea, PND, low grade fever, heart pounding and chest discomfort. Started on amiodarone for persistent afib.   Assessment & Plan    1. Recurrent chest pain/shortness of breath in context of known CAD s/p prior CABG/PCIs and aortic stenosis --severe native CAD, hx CABG with LIMA to LAD, VG to Left PL branch, and SVG to Rt PDA, severe in stent restenosis in VG to OM (2 layers of stent) treated with with noncompliant balloon angioplasty  --Aspirin x30 days, clopidogrel 75 mg at least 1 month favor 6 months if tolerated, resume apixaban  today-ordered --EKG today atrial fib and LBBB   2. Aortic stenosis--on cath  Moderate aortic stenosis with mean gradient 14 mmHg, calculated aortic valve area 1.37 cm, moderate amount of difficulty crossing the valve with a straight wire, suspect component of paradoxical low flow low gradient aortic stenosis  3. Persistent atrial fibrillation  Rate slow at times   4. Acute on chronic diastolic CHF, complicated by CKD stage III-IV --Cr 1.84 today down from 1.91 yesterday and 2.13 on the 6th     Has appt to see Almyra Deforest, PA tomorrow ?continue or  reschedule?     5.  DM per IM   6. HLD on crestor continue   7.  Mild hyponatremia   For questions or updates, please contact [place] Please consult [email or web address] for contact info under        Signed, Cecilie Kicks, NP  09/24/2020, 7:07 AM     Attending Note:   The patient was seen and examined.  Agree with assessment and plan as noted above.  Changes made to the above note as needed.  Patient seen and independently examined with Cecilie Kicks, NP.   We discussed all aspects of the encounter. I agree with the assessment and plan as stated above.  1.   Dyspnea:   At the time of heart catheterization his aortic valve only appeared to be moderately stenosed.  He did have a tight stenosis in the vein graft that had been previously stented.  He underwent balloon angioplasty of this vein graft.  He seems to be feeling better.  He should be able to be discharged today.  We will have him see our APP in several weeks.  2.  Chronic renal insufficiency.  Creatinine seems to be fairly stable.  His creatinine is actually a little bit better today.  3.  Atrial fibrillation: Rate is well controlled.  Continue Eliquis.   4.  Coronary artery disease: He status post balloon angioplasty of the stent.  He will be on Eliquis.  We will continue aspirin 81 mg a day  for 30 days.  Continue Plavix 75 mg daily  for at least 6 months and is much as 1  year.   I have spent a total of 40 minutes with patient reviewing hospital  notes , telemetry, EKGs, labs and examining patient as well as establishing an assessment and plan that was discussed with the patient. > 50% of time was spent in direct patient care.    Thayer Headings, Brooke Bonito., MD, [facility] 09/24/2020, 10:55 AM [address]  [place] Pager [phone]

## 2020-09-24 NOTE — Discharge Summary (Signed)
Discharge Summary  VERLON PISCHKE GQQ:[id number] DOB: 14-Nov-1932  PCP: [name], MD  Admit date: 09/20/2020 Discharge date: 09/24/2020  Time spent: 40 mins   Recommendations for Outpatient Follow-up:  1. PCP in 1 week 2. Cardiology as scheduled  Discharge Diagnoses:  Active Hospital Problems   Diagnosis Date Noted  . Angina at rest [facility]) 09/20/2020  . Chronic diastolic (congestive) heart failure ([place]) 09/20/2020  . Aortic stenosis, moderate 09/20/2020  . Persistent atrial fibrillation ([place]) 08/29/2019  . Acute on chronic diastolic CHF (congestive heart failure) ([place])   . CKD (chronic kidney disease), stage III ([place]) 06/04/2019  . PAF (paroxysmal atrial fibrillation) ([place])   . LBBB (left bundle branch block) 07/17/2014  . Crescendo angina ([place]) 07/16/2014  . CAD in native artery 11/06/2008  . HTN (hypertension) 11/22/2007    Resolved Hospital Problems  No resolved problems to display.    Discharge Condition: Stable  Diet recommendation: Heart healthy  Vitals:   09/24/20 0928 09/24/20 1239  BP: (!) 128/55 126/81  Pulse: 68 72  Resp: 18 18  Temp: 98.2 F (36.8 C) 99 F (37.2 C)  SpO2: 100% 100%    History of present illness:  86 year old male with history of CABG, with stents and in-stent stenosis HFpEF, HTN, paroxysmal A. fib on Eliquis, DM TY 2 with nephropathy, prostate cancer, COPD, prior pancreatitis. Recent hospitalization 11/21--11/22 DOE, COPD-Rx for COPD, CHF-repeat echo showed progression of aortic stenosis, admitted 12/4 2/2 SOB, fever and CP.  Patient admitted for further management.    Today, patient denies any new complaints, denies any further chest pain, no fevers noted, shortness of breath has improved, back to baseline.  Denied any nausea/vomiting, abdominal pain, diarrhea.  Patient very eager to be discharged. Advised to follow-up with cardiology as well as PCP in 1 week   Hospital Course:  Active Problems:   HTN (hypertension)   CAD  in native artery   Crescendo angina (HCC)   LBBB (left bundle branch block)   PAF (paroxysmal atrial fibrillation) (HCC)   CKD (chronic kidney disease), stage III (HCC)   Acute on chronic diastolic CHF (congestive heart failure) (HCC)   Persistent atrial fibrillation (HCC)   Angina at rest [facility])   Chronic diastolic (congestive) heart failure (HCC)   Aortic stenosis, moderate   History of CAD s/p CABG/PCI Aortic stenosis Recurrent chest pain Currently chest pain-free Cardiac cath done on 12/7 showed severe native three-vessel CAD, patency of LIMA to LAD, severe in-stent restenosis in the saphenous vein graft treated successfully treated with none compliant balloon angioplasty, moderate aortic stenosis Cardiology recommends aspirin for 30 days, clopidogrel 75 mg at least for 1 month, fever 6 months if tolerated, continue Coreg Follow-up with cardiology as scheduled  Acute on chronic diastolic HF Reduce Lasix to 40 daily, continue Aldactone  Hyponatremia Alert, oriented x3 Repeat BMP, later closely  CKD stage 3b Creatinine now at baseline As per cardiology restart diuresis, Lasix and Aldactone Repeat labs  Diabetes mellitus type 2 Continue home regimen  Persistent A. Fib Continue Eliquis, Coreg  Hypertension Continue amlodipine  COPD Continue home regimen, inhalers, nebulizer       Malnutrition Type:      Malnutrition Characteristics:      Nutrition Interventions:      Estimated body mass index is 25.03 kg/m as calculated from the following:   Height as of this encounter: $RemoveBeforeD'5\' 9"'JcNFFeeBpduGLq$  (1.753 m).   Weight as of this encounter: 76.9 kg.    Procedures:  Cardiac  cath  Consultations:  Cardiology  Discharge Exam: BP 126/81 (BP Location: Left Arm)   Pulse 72   Temp 99 F (37.2 C) (Oral)   Resp 18   Ht $R'5\' 9"'Zl$  (1.753 m)   Wt 76.9 kg   SpO2 100%   BMI 25.03 kg/m   General: NAD Cardiovascular: S1, S2 present, right groin with no significant  hematoma Respiratory: CTA B    Discharge Instructions You were cared for by a hospitalist during your hospital stay. If you have any questions about your discharge medications or the care you received while you were in the hospital after you are discharged, you can call the unit and asked to speak with the hospitalist on call if the hospitalist that took care of you is not available. Once you are discharged, your primary care physician will handle any further medical issues. Please note that NO REFILLS for any discharge medications will be authorized once you are discharged, as it is imperative that you return to your primary care physician (or establish a relationship with a primary care physician if you do not have one) for your aftercare needs so that they can reassess your need for medications and monitor your lab values.  Discharge Instructions    Amb Referral to Cardiac Rehabilitation   Complete by: As directed    Diagnosis: PTCA   After initial evaluation and assessments completed: Virtual Based Care may be provided alone or in conjunction with Phase 2 Cardiac Rehab based on patient barriers.: Yes   Diet - low sodium heart healthy   Complete by: As directed    Face-to-face encounter (required for Medicare/Medicaid patients)   Complete by: As directed    I Alma Friendly certify that this patient is under my care and that I, or a nurse practitioner or physician's assistant working with me, had a face-to-face encounter that meets the physician face-to-face encounter requirements with this patient on 09/24/2020. The encounter with the patient was in whole, or in part for the following medical condition(s) which is the primary reason for home health care (List medical condition):  ICM, CHF, Afib, DM, prostate CA   The encounter with the patient was in whole, or in part, for the following medical condition, which is the primary reason for home health care: ICM, Afib, DM, prostate CA   I  certify that, based on my findings, the following services are medically necessary home health services: Physical therapy   Reason for Medically Necessary Home Health Services: Therapy- Therapeutic Exercises to Increase Strength and Endurance   My clinical findings support the need for the above services: Shortness of breath with activity   Further, I certify that my clinical findings support that this patient is homebound due to: Shortness of Breath with activity   Home Health   Complete by: As directed    To provide the following care/treatments: PT   Increase activity slowly   Complete by: As directed      Allergies as of 09/24/2020      Reactions   Codeine Other (See Comments)   Makes him feel goofy   Morphine Other (See Comments)   Nightmares and felt bad      Medication List    TAKE these medications   acetaminophen 500 MG tablet Commonly known as: TYLENOL Take 1,000 mg by mouth every 4 (four) hours as needed for fever or headache (pain).   albuterol (2.5 MG/3ML) 0.083% nebulizer solution Commonly known as: PROVENTIL Take 3 mLs (2.5  mg total) by nebulization every 4 (four) hours as needed for wheezing or shortness of breath.   albuterol 108 (90 Base) MCG/ACT inhaler Commonly known as: VENTOLIN HFA Inhale 2 puffs into the lungs every 6 (six) hours as needed.   amLODipine 5 MG tablet Commonly known as: NORVASC Take 1 tablet (5 mg total) by mouth daily.   aspirin EC 81 MG tablet Take 81 mg by mouth daily. Swallow whole.   blood glucose meter kit and supplies Kit Dispense based on patient and insurance preference. Use up to four times daily as directed. (FOR ICD-9 250.00, 250.01).   budesonide-formoterol 160-4.5 MCG/ACT inhaler Commonly known as: Symbicort Inhale 2 puffs into the lungs in the morning and at bedtime.   carvedilol 6.25 MG tablet Commonly known as: COREG TAKE 1 TABLET (6.25 MG TOTAL) BY MOUTH 2 (TWO) TIMES DAILY WITH A MEAL.   cefdinir 300 MG  capsule Commonly known as: OMNICEF Take 1 capsule (300 mg total) by mouth daily.   clopidogrel 75 MG tablet Commonly known as: PLAVIX TAKE 1 TABLET (75 MG TOTAL) BY MOUTH DAILY. What changed: See the new instructions.   docusate sodium 100 MG capsule Commonly known as: Stool Softener Take 1 capsule (100 mg total) by mouth 2 (two) times daily as needed for mild constipation. What changed: when to take this   Eliquis 2.5 MG Tabs tablet Generic drug: apixaban TAKE 1 TABLET BY MOUTH TWICE A DAY What changed: how much to take   furosemide 40 MG tablet Commonly known as: LASIX Take 1 tablet (40 mg total) by mouth daily. What changed: when to take this   guaiFENesin-dextromethorphan 100-10 MG/5ML syrup Commonly known as: ROBITUSSIN DM Take 5 mLs by mouth every 6 (six) hours as needed for cough.   hydrocortisone 2.5 % cream Apply 1 application topically daily as needed (facial breakouts).   ICAPS AREDS 2 PO Take 1 capsule by mouth daily.   nitroGLYCERIN 0.4 MG SL tablet Commonly known as: NITROSTAT Place 1 tablet (0.4 mg total) under the tongue every 5 (five) minutes x 3 doses as needed for chest pain (if no relief after 3rd dose, proceed to the ED for an evaluation).   pantoprazole 40 MG tablet Commonly known as: PROTONIX TAKE 1 TABLET BY MOUTH EVERY DAY   rosuvastatin 20 MG tablet Commonly known as: CRESTOR TAKE 1 TABLET BY MOUTH EVERY DAY   sitaGLIPtin 50 MG tablet Commonly known as: Januvia Take 1 tablet (50 mg total) by mouth daily.   spironolactone 25 MG tablet Commonly known as: ALDACTONE TAKE 1 TABLET BY MOUTH EVERY DAY      Allergies  Allergen Reactions  . Codeine Other (See Comments)    Makes him feel goofy  . Morphine Other (See Comments)    Nightmares and felt bad    Follow-up Information    [name], MD Follow up on 09/29/2020.   Specialty: Cardiology Why: 11:15 AM with his PA [facility] information: [address] [place] [address] [phone]        [name], MD. Schedule an appointment as soon as possible for a visit in 1 week(s).   Specialty: Family Medicine Contact information: [place] [address] [phone]                The results of significant diagnostics from this hospitalization (including imaging, microbiology, ancillary and laboratory) are listed below for reference.    Significant Diagnostic Studies: DG  Chest 2 View  Result Date: 09/20/2020 CLINICAL DATA:  Chest pain, shortness of breath EXAM: CHEST - 2 VIEW COMPARISON:  Chest x-rays dated 09/07/2020 in 04/02/2020 FINDINGS: Borderline cardiomegaly, stable. Median sternotomy wires appear intact and stable in alignment. Lungs are clear. No pleural effusion or pneumothorax is seen. No acute appearing osseous abnormality. IMPRESSION: No active cardiopulmonary disease. No evidence of pneumonia or pulmonary edema. Electronically Signed   By: Franki Cabot M.D.   On: 09/20/2020 09:11   CARDIAC CATHETERIZATION  Result Date: 09/23/2020  Balloon angioplasty was performed using a BALLOON SAPPHIRE [place] 3.5X12.  Post intervention, there is a 20% residual stenosis.  1.  Severe native three-vessel coronary artery disease 2.  Status post aortocoronary bypass surgery with continued patency of the LIMA to LAD, saphenous vein graft to left posterior lateral branch, and saphenous vein graft to right PDA 3.  Severe in-stent restenosis in the saphenous vein graft to obtuse marginal (2 layers of stent), treated successfully with noncompliant balloon angioplasty (3.5 mm noncompliant balloon to 22 atm) 4.  Moderate aortic stenosis with mean gradient 14 mmHg, calculated aortic valve area 1.37 cm, moderate amount of difficulty crossing the valve with a straight wire, suspect component of paradoxical low flow low gradient aortic stenosis 5.  Normal diastolic filling pressures in the right and left heart  Recommendations: Aspirin x30 days, clopidogrel 75 mg at least 1 month favor 6 months if tolerated, resume apixaban tomorrow Total contrast: 40 cc   DG Chest Portable 1 View  Result Date: 09/07/2020 CLINICAL DATA:  Shortness of breath EXAM: PORTABLE CHEST 1 VIEW COMPARISON:  April 02, 2020 FINDINGS: No pneumothorax. Cardiomegaly. The hila and mediastinum are unremarkable. No pulmonary nodules, masses, focal infiltrates, or overt edema. IMPRESSION: No active disease. Electronically Signed   By: Dorise Bullion III M.D   On: 09/07/2020 16:44   ECHOCARDIOGRAM COMPLETE  Result Date: 09/08/2020    ECHOCARDIOGRAM REPORT   Patient Name:   MALIKIAH DEBARR Mesler Date of Exam: 09/08/2020 Medical Rec #:  [id number]    Height:       69.0 in Accession #:    [id number]   Weight:       175.5 lb Date of Birth:  1933/04/09    BSA:          1.954 m Patient Age:    17 years     BP:           120/51 mmHg Patient Gender: M            HR:           51 bpm. Exam Location:  Inpatient Procedure: 2D Echo, Cardiac Doppler and Color Doppler Indications:    R06.02 SOB  History:        Patient has prior history of Echocardiogram examinations, most                 recent 07/27/2019. Cardiomyopathy and CHF, CAD, Abnormal ECG,                 Aortic Valve Disease, Arrythmias:Atrial Fibrillation and LBBB,                 Signs/Symptoms:Chest Pain; Risk Factors:Hypertension, Diabetes                 and Dyslipidemia. Cancer. Aortic stenosis.  Sonographer:    Roseanna Rainbow RDCS Referring Phys: [id number] [place]  Sonographer Comments: Technically difficult study due to poor echo windows, no subcostal window  and suboptimal apical window. IMPRESSIONS  1. Left ventricular ejection fraction, by estimation, is 50 to 55%. The left ventricle has low normal function. The left ventricle demonstrates regional wall motion abnormalities (see scoring diagram/findings for description). There is severe left ventricular hypertrophy. Left ventricular diastolic  function could not be evaluated. There is severe hypokinesis of the left ventricular, basal-mid inferior wall.  2. Right ventricular systolic function is moderately reduced. The right ventricular size is normal.  3. Right atrial size was mildly dilated.  4. The mitral valve is abnormal. Trivial mitral valve regurgitation.  5. The aortic valve is calcified. Aortic valve regurgitation is not visualized. Moderate to severe aortic valve stenosis. Aortic valve area, by VTI measures 0.87 cm. Aortic valve mean gradient measures 22.0 mmHg. Aortic valve Vmax measures 2.99 m/s. DI  of 0.25.  6. Aortic dilatation noted. There is mild dilatation of the ascending aorta, measuring 40 mm.  7. The inferior vena cava is dilated in size with <50% respiratory variability, suggesting right atrial pressure of 15 mmHg. Comparison(s): Changes from prior study are noted. 07/27/2019: LVEF 60-65%, severe LVH, mild AS - peak and mean gradients of 15/26 mmHg. FINDINGS  Left Ventricle: Left ventricular ejection fraction, by estimation, is 50 to 55%. The left ventricle has low normal function. The left ventricle demonstrates regional wall motion abnormalities. Severe hypokinesis of the left ventricular, basal-mid inferior wall. The left ventricular internal cavity size was normal in size. There is severe left ventricular hypertrophy. Abnormal (paradoxical) septal motion, consistent with left bundle branch block. Left ventricular diastolic function could not be evaluated due to atrial fibrillation. Left ventricular diastolic function could not be evaluated. Right Ventricle: The right ventricular size is normal. No increase in right ventricular wall thickness. Right ventricular systolic function is moderately reduced. Left Atrium: Left atrial size was normal in size. Right Atrium: Right atrial size was mildly dilated. Pericardium: There is no evidence of pericardial effusion. Mitral Valve: The mitral valve is abnormal. Mild to moderate mitral  annular calcification. Trivial mitral valve regurgitation. Tricuspid Valve: The tricuspid valve is grossly normal. Tricuspid valve regurgitation is not demonstrated. Aortic Valve: The aortic valve is calcified. Aortic valve regurgitation is not visualized. Moderate to severe aortic stenosis is present. Aortic valve mean gradient measures 22.0 mmHg. Aortic valve peak gradient measures 35.8 mmHg. Aortic valve area, by VTI measures 0.87 cm. Pulmonic Valve: The pulmonic valve was grossly normal. Pulmonic valve regurgitation is trivial. Aorta: Aortic dilatation noted. There is mild dilatation of the ascending aorta, measuring 40 mm. Venous: The inferior vena cava is dilated in size with less than 50% respiratory variability, suggesting right atrial pressure of 15 mmHg. IAS/Shunts: No atrial level shunt detected by color flow Doppler.  LEFT VENTRICLE PLAX 2D LVIDd:         3.80 cm LVIDs:         3.15 cm LV PW:         1.90 cm LV IVS:        2.00 cm LVOT diam:     2.10 cm LV SV:         57 LV SV Index:   29 LVOT Area:     3.46 cm  LV Volumes (MOD) LV vol d, MOD A2C: 115.0 ml LV vol d, MOD A4C: 117.0 ml LV vol s, MOD A2C: 47.4 ml LV vol s, MOD A4C: 51.6 ml LV SV MOD A2C:     67.6 ml LV SV MOD A4C:     117.0 ml  LV SV MOD BP:      64.7 ml RIGHT VENTRICLE            IVC RV S prime:     5.12 cm/s  IVC diam: 2.40 cm TAPSE (M-mode): 0.7 cm LEFT ATRIUM             Index       RIGHT ATRIUM           Index LA diam:        5.00 cm 2.56 cm/m  RA Area:     21.30 cm LA Vol (A2C):   57.7 ml 29.52 ml/m RA Volume:   67.00 ml  34.28 ml/m LA Vol (A4C):   62.3 ml 31.88 ml/m LA Biplane Vol: 62.0 ml 31.72 ml/m  AORTIC VALVE AV Area (Vmax):    0.86 cm AV Area (Vmean):   0.80 cm AV Area (VTI):     0.87 cm AV Vmax:           299.00 cm/s AV Vmean:          226.000 cm/s AV VTI:            0.659 m AV Peak Grad:      35.8 mmHg AV Mean Grad:      22.0 mmHg LVOT Vmax:         74.20 cm/s LVOT Vmean:        52.300 cm/s LVOT VTI:          0.166  m LVOT/AV VTI ratio: 0.25  AORTA Ao Root diam: 3.30 cm Ao Asc diam:  4.00 cm MITRAL VALVE MV Area (PHT): 4.52 cm     SHUNTS MV Decel Time: 168 msec     Systemic VTI:  0.17 m MV E velocity: 117.00 cm/s  Systemic Diam: 2.10 cm Lyman Bishop MD Electronically signed by Lyman Bishop MD Signature Date/Time: 09/08/2020/10:59:23 AM    Final     Microbiology: Recent Results (from the past 240 hour(s))  Resp Panel by RT-PCR (Flu A&B, Covid) Nasopharyngeal Swab     Status: None   Collection Time: 09/20/20 12:04 PM   Specimen: Nasopharyngeal Swab; Nasopharyngeal(NP) swabs in vial transport medium  Result Value Ref Range Status   SARS Coronavirus 2 by RT PCR NEGATIVE NEGATIVE Final    Comment: (NOTE) SARS-CoV-2 target nucleic acids are NOT DETECTED.  The SARS-CoV-2 RNA is generally detectable in upper respiratory specimens during the acute phase of infection. The lowest concentration of SARS-CoV-2 viral copies this assay can detect is 138 copies/mL. A negative result does not preclude SARS-Cov-2 infection and should not be used as the sole basis for treatment or other patient management decisions. A negative result may occur with  improper specimen collection/handling, submission of specimen other than nasopharyngeal swab, presence of viral mutation(s) within the areas targeted by this assay, and inadequate number of viral copies(<138 copies/mL). A negative result must be combined with clinical observations, patient history, and epidemiological information. The expected result is Negative.  Fact Sheet for Patients:  EntrepreneurPulse.com.au  Fact Sheet for Healthcare Providers:  IncredibleEmployment.be  This test is no t yet approved or cleared by the [place] FDA and  has been authorized for detection and/or diagnosis of SARS-CoV-2 by FDA under an Emergency Use Authorization (EUA). This EUA will remain  in effect (meaning this test can be used) for  the duration of the COVID-19 declaration under Section 564(b)(1) of the Act, 21 U.S.C.section 360bbb-3(b)(1), unless the authorization is terminated  or revoked  sooner.       Influenza A by PCR NEGATIVE NEGATIVE Final   Influenza B by PCR NEGATIVE NEGATIVE Final    Comment: (NOTE) The Xpert Xpress SARS-CoV-2/FLU/RSV plus assay is intended as an aid in the diagnosis of influenza from Nasopharyngeal swab specimens and should not be used as a sole basis for treatment. Nasal washings and aspirates are unacceptable for Xpert Xpress SARS-CoV-2/FLU/RSV testing.  Fact Sheet for Patients: EntrepreneurPulse.com.au  Fact Sheet for Healthcare Providers: IncredibleEmployment.be  This test is not yet approved or cleared by the [place] FDA and has been authorized for detection and/or diagnosis of SARS-CoV-2 by FDA under an Emergency Use Authorization (EUA). This EUA will remain in effect (meaning this test can be used) for the duration of the COVID-19 declaration under Section 564(b)(1) of the Act, 21 U.S.C. section 360bbb-3(b)(1), unless the authorization is terminated or revoked.  Performed at [facility], [place] [address] [place], [place] [address]   Culture, blood (routine x 2)     Status: None (Preliminary result)   Collection Time: 09/20/20  3:07 PM   Specimen: BLOOD  Result Value Ref Range Status   Specimen Description BLOOD SITE NOT SPECIFIED  Final   Special Requests   Final    BOTTLES DRAWN AEROBIC AND ANAEROBIC Blood Culture results may not be optimal due to an inadequate volume of blood received in culture bottles   Culture   Final    NO GROWTH 4 DAYS Performed at [facility], [place] [address], [place], [place] [address]    Report Status PENDING  Incomplete  Culture, blood (routine x 2)     Status: None (Preliminary result)   Collection Time: 09/20/20  3:23 PM   Specimen: BLOOD  Result Value Ref Range Status   Specimen  Description BLOOD SITE NOT SPECIFIED  Final   Special Requests   Final    BOTTLES DRAWN AEROBIC AND ANAEROBIC Blood Culture results may not be optimal due to an inadequate volume of blood received in culture bottles   Culture   Final    NO GROWTH 4 DAYS Performed at [facility], [place] [address] [place], [place] [address]    Report Status PENDING  Incomplete     Labs: Basic Metabolic Panel: Recent Labs  Lab 09/20/20 0843 09/20/20 0843 09/21/20 0329 09/21/20 0329 09/21/20 1423 09/22/20 0400 09/23/20 0236 09/23/20 1436 09/24/20 0351  NA 131*   < > 132*  --   --  134* 134* 136  137 131*  K 4.3   < > 3.3*   < > 4.0 3.9 4.0 3.8  3.7 4.1  CL 94*  --  96*  --   --  98 98  --  100  CO2 24  --  24  --   --  24 24  --  19*  GLUCOSE 258*  --  181*  --   --  150* 152*  --  179*  BUN 20  --  25*  --   --  29* 27*  --  22  CREATININE 1.79*  --  1.94*  --   --  2.13* 1.91*  --  1.84*  CALCIUM 9.8  --  9.4  --   --  9.3 9.5  --  9.0  MG  --   --  1.8  --  2.0 2.0 1.9  --  1.9  PHOS  --   --  3.9  --   --  3.1 3.1  --  3.0   < > = values in this interval not displayed.   Liver Function Tests: Recent Labs  Lab 09/21/20 0329 09/22/20 0400 09/23/20 0236 09/24/20 0351  AST 14* $Remov'24 26 21  'RJXZxh$ ALT 25 37 44 41  ALKPHOS 72 72 81 80  BILITOT 1.8* 1.4* 1.7* 1.6*  PROT 6.7 6.7 6.7 6.2*  ALBUMIN 3.0* 3.0* 3.0* 2.8*   No results for input(s): LIPASE, AMYLASE in the last 168 hours. No results for input(s): AMMONIA in the last 168 hours. CBC: Recent Labs  Lab 09/20/20 1703 09/20/20 1703 09/21/20 0329 09/22/20 0400 09/23/20 0236 09/23/20 1436 09/24/20 0351  WBC 14.3*  --  11.4* 11.2* 9.9  --  8.5  NEUTROABS  --   --  8.7* 8.6* 7.5  --  6.5  HGB 12.8*   < > 12.8* 12.4* 12.5* 11.9*  12.2* 12.1*  HCT 35.7*   < > 36.3* 36.4* 35.8* 35.0*  36.0* 36.3*  MCV 86.7  --  86.8 88.3 87.5  --  88.5  PLT 137*  --  142* 153 173  --  161   < > = values in this interval not displayed.    Cardiac Enzymes: No results for input(s): CKTOTAL, CKMB, CKMBINDEX, TROPONINI in the last 168 hours. BNP: BNP (last 3 results) Recent Labs    12/27/19 1120 09/07/20 1619 09/20/20 1130  BNP 234.3* 426.6* 685.4*    ProBNP (last 3 results) Recent Labs    04/02/20 1053  PROBNP 359.0*    CBG: Recent Labs  Lab 09/23/20 1200 09/23/20 1609 09/23/20 2138 09/24/20 0732 09/24/20 1218  GLUCAP 154* 94 180* 218* 147*       Signed:  Alma Friendly, MD [facility] 09/24/2020, 12:42 PM

## 2020-09-24 NOTE — Plan of Care (Signed)
Problem: Education: Goal: Understanding of cardiac disease, CV risk reduction, and recovery process will improve 09/24/2020 1309 by Camillia Herter, RN Outcome: Adequate for Discharge 09/24/2020 0758 by Camillia Herter, RN Outcome: Progressing Goal: Individualized Educational Video(s) 09/24/2020 1309 by Camillia Herter, RN Outcome: Adequate for Discharge 09/24/2020 0758 by Camillia Herter, RN Outcome: Progressing   Problem: Activity: Goal: Ability to tolerate increased activity will improve 09/24/2020 1309 by Camillia Herter, RN Outcome: Adequate for Discharge 09/24/2020 0758 by Camillia Herter, RN Outcome: Progressing   Problem: Education: Goal: Knowledge of General Education information will improve Description: Including pain rating scale, medication(s)/side effects and non-pharmacologic comfort measures 09/24/2020 1309 by Camillia Herter, RN Outcome: Adequate for Discharge 09/24/2020 0758 by Camillia Herter, RN Outcome: Progressing   Problem: Health Behavior/Discharge Planning: Goal: Ability to manage health-related needs will improve 09/24/2020 1309 by Camillia Herter, RN Outcome: Adequate for Discharge 09/24/2020 0758 by Camillia Herter, RN Outcome: Progressing   Problem: Clinical Measurements: Goal: Will remain free from infection 09/24/2020 1309 by Camillia Herter, RN Outcome: Adequate for Discharge 09/24/2020 0758 by Camillia Herter, RN Outcome: Progressing Goal: Diagnostic test results will improve 09/24/2020 1309 by Camillia Herter, RN Outcome: Adequate for Discharge 09/24/2020 0758 by Camillia Herter, RN Outcome: Progressing Goal: Respiratory complications will improve 09/24/2020 1309 by Camillia Herter, RN Outcome: Adequate for Discharge 09/24/2020 0758 by Camillia Herter, RN Outcome: Progressing Goal: Cardiovascular complication will be avoided 09/24/2020 1309 by Camillia Herter, RN Outcome: Adequate for Discharge 09/24/2020 0758 by Camillia Herter, RN Outcome: Progressing    Problem: Activity: Goal: Risk for activity intolerance will decrease 09/24/2020 1309 by Camillia Herter, RN Outcome: Adequate for Discharge 09/24/2020 0758 by Camillia Herter, RN Outcome: Progressing   Problem: Elimination: Goal: Will not experience complications related to bowel motility 09/24/2020 1309 by Camillia Herter, RN Outcome: Adequate for Discharge 09/24/2020 0758 by Camillia Herter, RN Outcome: Progressing   Problem: Safety: Goal: Ability to remain free from injury will improve 09/24/2020 1309 by Camillia Herter, RN Outcome: Adequate for Discharge 09/24/2020 0758 by Camillia Herter, RN Outcome: Progressing   Problem: Skin Integrity: Goal: Risk for impaired skin integrity will decrease 09/24/2020 1309 by Camillia Herter, RN Outcome: Adequate for Discharge 09/24/2020 0758 by Camillia Herter, RN Outcome: Progressing   Problem: Education: Goal: Individualized Educational Video(s) 09/24/2020 1309 by Camillia Herter, RN Outcome: Adequate for Discharge 09/24/2020 0758 by Camillia Herter, RN Outcome: Progressing   Problem: Cardiac: Goal: Ability to achieve and maintain adequate cardiopulmonary perfusion will improve 09/24/2020 1309 by Camillia Herter, RN Outcome: Adequate for Discharge 09/24/2020 0758 by Camillia Herter, RN Outcome: Progressing   Problem: Education: Goal: Understanding of CV disease, CV risk reduction, and recovery process will improve 09/24/2020 1309 by Camillia Herter, RN Outcome: Adequate for Discharge 09/24/2020 0758 by Camillia Herter, RN Outcome: Progressing Goal: Individualized Educational Video(s) 09/24/2020 1309 by Camillia Herter, RN Outcome: Adequate for Discharge 09/24/2020 0758 by Camillia Herter, RN Outcome: Progressing   Problem: Activity: Goal: Ability to return to baseline activity level will improve 09/24/2020 1309 by Camillia Herter, RN Outcome: Adequate for Discharge 09/24/2020 0758 by Camillia Herter, RN Outcome: Progressing   Problem:  Cardiovascular: Goal: Ability to achieve and maintain adequate cardiovascular perfusion will improve 09/24/2020 1309 by Camillia Herter, RN Outcome: Adequate for Discharge 09/24/2020 0758 by Camillia Herter, RN Outcome:  Progressing

## 2020-09-24 NOTE — Care Management (Signed)
1325 09-24-20 Case Manager received a consult for heart failure referral. Case Manager spoke with patient and wife regarding Home Health Physical Therapy- patient has declined services at this time. Case Manager did make patient aware to contact his primary care provider if he changes his mind regarding services once he gets home. Bethena Roys, RN,BSN Case Manager

## 2020-09-24 NOTE — Plan of Care (Signed)
  Problem: Education: Goal: Understanding of cardiac disease, CV risk reduction, and recovery process will improve Outcome: Progressing Goal: Individualized Educational Video(s) Outcome: Progressing   Problem: Activity: Goal: Ability to tolerate increased activity will improve Outcome: Progressing   Problem: Education: Goal: Knowledge of General Education information will improve Description: Including pain rating scale, medication(s)/side effects and non-pharmacologic comfort measures Outcome: Progressing   Problem: Health Behavior/Discharge Planning: Goal: Ability to manage health-related needs will improve Outcome: Progressing   Problem: Clinical Measurements: Goal: Will remain free from infection Outcome: Progressing Goal: Diagnostic test results will improve Outcome: Progressing Goal: Respiratory complications will improve Outcome: Progressing Goal: Cardiovascular complication will be avoided Outcome: Progressing   Problem: Activity: Goal: Risk for activity intolerance will decrease Outcome: Progressing   Problem: Elimination: Goal: Will not experience complications related to bowel motility Outcome: Progressing   Problem: Safety: Goal: Ability to remain free from injury will improve Outcome: Progressing   Problem: Skin Integrity: Goal: Risk for impaired skin integrity will decrease Outcome: Progressing   Problem: Education: Goal: Individualized Educational Video(s) Outcome: Progressing   Problem: Cardiac: Goal: Ability to achieve and maintain adequate cardiopulmonary perfusion will improve Outcome: Progressing   Problem: Education: Goal: Understanding of CV disease, CV risk reduction, and recovery process will improve Outcome: Progressing Goal: Individualized Educational Video(s) Outcome: Progressing   Problem: Activity: Goal: Ability to return to baseline activity level will improve Outcome: Progressing   Problem: Cardiovascular: Goal: Ability to  achieve and maintain adequate cardiovascular perfusion will improve Outcome: Progressing

## 2020-09-24 NOTE — Progress Notes (Signed)
09/24/2020 PT TAVR Pre-Assessment  HPI: [name] is a 86 y.o. male with a hx of  CAD/CABG, persistent AF, DM2, HTN, HLD, CKD, and chronic systolic and diastolic heart failure w/ mod-severe ( Low volume/normal EF) AS, seen 12/4 for dyspnea and chest pain in setting of low grade fever ( recurrent).   Clinical Impression Statement: Pt is a 86 y.o. male being assessed for pre-TAVR.  Pt reports symptoms of shortness of breath with activity.  Pt has 5/5 strength, full ROM, and good balance.  Pt ambulated 609 ft during the 6 minute walk test requiring 1 rest breaks with max HR of 96, lowest O2 sat 95%, BP 143/83 during mobility.  5 meter walk test produced an average gait speed of 0.48 ft/s which indicates pt is at a high fall risk.  RPE was 15 and dyspnea was 3 during mobility.  Pt was limited by fatigue.  Pt's frailty rating was 3 which is considered managing well.  Pt would benefit from continued PT in the acute care setting due to the above listed deficits in balance, strength, ROM, endurance and activity tolerance.    General UE/LE Strength and ROM:  Strength (0-5/5) ROM (limited/full)  R UE 5/5 full  L UE 5/5 full  R LE 5/5 full  L LE 5/5 full    6 Minute Walk Test:   Total Distance Walked:609 ft.    Did the pt need a rest break? Yes If yes, why? Pain:no Fatigue: yes; Dyspnea/O2 saturations: no   Pre-Test Post-Test  BP 127/69 143/83  HR 64  89  O2 saturations (indicated RA or L/min ) 96%, RA 95%, RA  Modified Borg Dyspnea Scale (0 none-10 maximal) 0 3  RPE (6 very light-10 very hard) 6 15  Comments: Pt required one standing rest break.  5 Meter Walk Test:  Trial 1 7.91 seconds  Trial 2 7.66 seconds  Trial 3 7.78 seconds  3 Trial Average/Gait Speed 0.48 ft/sec (<1.8 ft/sec indicates high fall risk)  Comments:   Clinical Frailty Scale (1 very fit - 9 terminally ill): 3 (</= 5/12 is considered frail)  Wyona Almas, PT, DPT Acute Rehabilitation Services Pager  [phone] Office [phone]

## 2020-09-24 NOTE — Progress Notes (Signed)
CARDIAC REHAB PHASE I   Offered to walk with pt. Pt states some ambulation in room, still c/o SOB. Pt and wife educated on importance of ASA, Plavix, and Eliquis. Pt given heart healthy and diabetic diets. Reviewed site care, restrictions, and exercise guidelines with emphasis on safety and symptom monitoring. Pt hopeful for d/c today. Will refer to [facility] [place] with knowledge pt is getting a TAVR w/u.  [id number] Rufina Falco, RN BSN 09/24/2020 10:24 AM

## 2020-09-24 NOTE — Plan of Care (Signed)
  Problem: Activity: Goal: Ability to tolerate increased activity will improve Outcome: Progressing   Problem: Cardiac: Goal: Ability to achieve and maintain adequate cardiovascular perfusion will improve Outcome: Completed/Met   Problem: Health Behavior/Discharge Planning: Goal: Ability to safely manage health-related needs after discharge will improve Outcome: Completed/Met   Problem: Clinical Measurements: Goal: Ability to maintain clinical measurements within normal limits will improve Outcome: Completed/Met   Problem: Coping: Goal: Level of anxiety will decrease Outcome: Completed/Met   Problem: Cardiovascular: Goal: Vascular access site(s) Level 0-1 will be maintained Outcome: Completed/Met   Problem: Health Behavior/Discharge Planning: Goal: Ability to safely manage health-related needs after discharge will improve Outcome: Completed/Met   Problem: Activity: Goal: Ability to tolerate increased activity will improve Outcome: Progressing

## 2020-09-24 NOTE — Discharge Instructions (Signed)
Call [facility] [address] at [phone] if any bleeding, swelling or drainage at cath site.  May shower, no tub baths for 48 hours for groin sticks. No lifting over 5 pounds for 3 days.  No Driving for 3 days ° °

## 2020-09-24 NOTE — Progress Notes (Signed)
Physical Therapy Treatment Patient Details Name: [name] MRN: [id number] DOB: 1933-02-11 Today's Date: 09/24/2020    History of Present Illness [name] is a 86 y.o. male with a hx of  CAD/CABG, persistent AF, DM2, HTN, HLD, CKD, and chronic systolic and diastolic heart failure w/ mod-severe ( Low volume/normal EF) AS, seen 12/4 for dyspnea and chest pain in setting of low grade fever ( recurrent).     PT Comments    Session focused on pre-TAVR assessment (see prior note). Pt ambulating > 600 ft with no assistive device or physical assist. Demonstrates slow gait speed. Updated d/c plan. Pt d/c from hospital.   Follow Up Recommendations  No PT follow up;Supervision - Intermittent     Equipment Recommendations  None recommended by PT    Recommendations for Other Services       Precautions / Restrictions Precautions Precautions: Fall Restrictions Weight Bearing Restrictions: No    Mobility  Bed Mobility Overal bed mobility: Independent                Transfers Overall transfer level: Independent Equipment used: None                Ambulation/Gait Ambulation/Gait assistance: Modified independent (Device/Increase time) Gait Distance (Feet): 609 Feet Assistive device: None Gait Pattern/deviations: Decreased stride length;Step-through pattern     General Gait Details: No gross instability, slow pace   Stairs             Wheelchair Mobility    Modified Rankin (Stroke Patients Only)       Balance Overall balance assessment: Needs assistance Sitting-balance support: No upper extremity supported;Feet supported Sitting balance-Leahy Scale: Normal     Standing balance support: No upper extremity supported;During functional activity Standing balance-Leahy Scale: Good                              Cognition Arousal/Alertness: Awake/alert Behavior During Therapy: WFL for tasks assessed/performed Overall Cognitive Status:  Within Functional Limits for tasks assessed                                        Exercises      General Comments        Pertinent Vitals/Pain Pain Assessment: No/denies pain    Home Living                      Prior Function            PT Goals (current goals can now be found in the care plan section) Acute Rehab PT Goals Patient Stated Goal: go home PT Goal Formulation: With patient Time For Goal Achievement: 10/05/20 Potential to Achieve Goals: Good Progress towards PT goals: Progressing toward goals    Frequency    Min 3X/week      PT Plan Discharge plan needs to be updated    Co-evaluation              AM-PAC PT "6 Clicks" Mobility   Outcome Measure  Help needed turning from your back to your side while in a flat bed without using bedrails?: None Help needed moving from lying on your back to sitting on the side of a flat bed without using bedrails?: None Help needed moving to and from a bed to a chair (including a wheelchair)?:  None Help needed standing up from a chair using your arms (e.g., wheelchair or bedside chair)?: None Help needed to walk in hospital room?: None Help needed climbing 3-5 steps with a railing? : A Little 6 Click Score: 23    End of Session   Activity Tolerance: Patient tolerated treatment well Patient left: with family/visitor present;in bed;with call bell/phone within reach Nurse Communication: Mobility status PT Visit Diagnosis: Muscle weakness (generalized) (M62.81)     Time: 6803-2122 PT Time Calculation (min) (ACUTE ONLY): 27 min  Charges:  $Therapeutic Activity: 23-37 mins                     Wyona Almas, PT, DPT Acute Rehabilitation Services Pager [phone] Office [phone]    Deno Etienne 09/24/2020, 4:36 PM

## 2020-09-24 NOTE — Plan of Care (Signed)
  Problem: Activity: Goal: Ability to tolerate increased activity will improve Outcome: Progressing   Problem: Cardiac: Goal: Ability to achieve and maintain adequate cardiovascular perfusion will improve Outcome: Completed/Met   Problem: Health Behavior/Discharge Planning: Goal: Ability to safely manage health-related needs after discharge will improve Outcome: Completed/Met   Problem: Clinical Measurements: Goal: Ability to maintain clinical measurements within normal limits will improve Outcome: Completed/Met   Problem: Coping: Goal: Level of anxiety will decrease Outcome: Completed/Met

## 2020-09-25 ENCOUNTER — Ambulatory Visit: Payer: Medicare PPO | Admitting: Physician Assistant

## 2020-09-25 ENCOUNTER — Telehealth: Payer: Self-pay | Admitting: Family Medicine

## 2020-09-25 LAB — CULTURE, BLOOD (ROUTINE X 2)
Culture: NO GROWTH
Culture: NO GROWTH

## 2020-09-25 NOTE — Telephone Encounter (Signed)
Transition Care Management Follow-up Telephone Call  Date of discharge and from where: 09/24/2020 from [facility]   How have you been since you were released from the hospital? Patient states that he is not back to 100% but he is feeling some better   Any questions or concerns? Yes, Patient states he gets depressed and wants to know if we can get him something. Also has concerns of electrolyte replacement   Items Reviewed:  Did the pt receive and understand the discharge instructions provided? Yes   Medications obtained and verified? Yes   Other? No   Any new allergies since your discharge? No   Dietary orders reviewed? Yes  Do you have support at home? Yes   Home Care and Equipment/Supplies: Were home health services ordered? not applicable If so, what is the name of the agency? N/A   Has the agency set up a time to come to the patient's home? not applicable Were any new equipment or medical supplies ordered?  No What is the name of the medical supply agency?  Were you able to get the supplies/equipment? not applicable Do you have any questions related to the use of the equipment or supplies? No  Functional Questionnaire: (I = Independent and D = Dependent) ADLs: I  Bathing/Dressing- I  Meal Prep- I  Eating- I  Maintaining continence- I  Transferring/Ambulation- I  Managing Meds- I  Follow up appointments reviewed:   PCP Hospital f/u appt confirmed? Yes  Scheduled to see Dr. Elease Hashimoto on 10/01/2020 @ 1:15 PM .  [facility] f/u appt confirmed? Yes  Scheduled to see Dr.Goodrich  on 09/29/2020 @ 11:15 am.  Are transportation arrangements needed? No   If their condition worsens, is the pt aware to call PCP or go to the Emergency Dept.? Yes  Was the patient provided with contact information for the PCP's office or ED? Yes  Was to pt encouraged to call back with questions or concerns? Yes

## 2020-09-27 NOTE — Progress Notes (Addendum)
Cardiology Office Note:    Date:  09/29/2020   ID:  [name], DOB August 01, 1933, MRN [id number]  PCP:  [name], MD  Cardiologist:  [name], MD  Electrophysiologist:  None   Referring MD: [name], MD   Chief Complaint: hospital follow-up for chest pain s/p balloon angioplasty and aortic stenosis  History of Present Illness:    [name] is a 86 y.o. male with a history of CAD s/p CABG x4 with subsequent PCIs most recently balloon angioplasty to severe in-stent restenosis of SVG-OM2 on 09/23/2020, chronic combined CHF with EF of 50-55% on Echo in 08/2020, persistent atrial fibrillation on Eliquis, moderate to severe aortic stenosis, bilateral carotid artery disease with 1-39% stenosis of right ICA and 60-79% stenosis of left ICA on dopplers in 03/2020, LBBB, PVCs, hypertension, hyperlipidemia, type 2 diabetes mellitus, CKD stage III-IV, COPD/asthma, gastric ulcer, and prostate cancer and presents today for hospital follow-up.   Patient has a long history of CAD s/p CABG x4 with subsequent PCIs. Cardiopulmonary exercise test in 2020 showed mixed restrictive/obstructive pattern but did have submaximal effort during exercise. Heart monitor in 09/2019 showed 100% atrial fibrillation with controlled ventricular rate and ventricular bigeminy/trigeminy. After he was adequately anticoagulated, he underwent DCCV in 10/2019. Immediately following DCCV, he remained in atrial fibrillation. However, post-procedure KEG did show normal sinus rhythm. Unfortunately, he was back in atrial fibrillation at follow-up visit in 12/2019. He continued to complain of dyspnea at this time and was referred to Pulmonology.   Patient was admitted in 08/2020 with acute on chronic combined CHF and COPD exacberation after presenting with chest pain and shortness of breath. Patient did note exertional chest pain relieved with rest and sublingual Nitro almost daily for the prior 2 months - this was  concerning for angina. High-sensitivity troponin was mildly elevated and flat in the 70's to 90's range. Echo showed LVEF of 50-55% with severe hypokinesis of basal-mid inferior wall, severe LVH, and moderate to severe AS (mean gradient 22.0 mmHg, peak gradient 35.8 mmHg). He was treated with IV Lasix and steroids with improvement. He was discharged with plans for outpatient Lexiscan for further evaluation of chest pain given CKD.  He was readmitted from 09/20/2020 to 09/24/2020 after presenting with severe chest pain that woke him from sleep with associated shortness of breath as well as orthopnea/PND and fever. High-sensitivity troponin was again mildly elevated. He was started on IV Lasix but this ultimately had to be stopped due to worsening renal function. Initially, there was concern that his CHF exacerbation may be secondary to atrial fibrillation. Therefore, he was started on Amiodarone with plans for possible TEE/DCCV. Itwas then felt that his aortic stenosis was his biggest cardiovascular problem. Amiodarone was discontinued and Structural Heart Team was consulted and recommended work-up for possible TAVR. Patient underwent right/left heart cath on 09/23/2020 which showed severe native vessel CAD with patent LIMA to LAD, SVG to left PLA, and SVG to right PDA grafts. He had severe in-stent restenosis of SVG to OM graft which was treated with noncompliant balloon angioplasty. Moderate AS was noted with mean gradient of 14 mmHg but a component of paradoxical low flow low gradient AS was suspected. Triple therapy with Aspirin, Plavix, and Eliquis was recommended for 1 month at which time Aspirin can be discontinued. Plavix recommended for 6 months. Lasix was reduced to 24m once daily and Spironolactone was continued as well as Coreg and Amlodipine.  Patient presents today for follow-up. Here today  with his wife. Patient has recurrent dyspnea with minimal exertion (such as walking to the bathroom) as well as  orthopnea/PND. No lower extremity edema. He also notes chest pain that he describes as something heavy on his chest after he gets acutely short of breath. This is also associated with whole body tremors and palpitations. He had 2 episodes of severe chest pain last night - interestingly, both times pain improved a few minutes after taking Tylenol. No significant improvement with Nitro. All of these symptoms are similar to the symptoms he was having prior to both recent hospitalization. No real improvement. He does note some occasional mild lightheadedness/dizziness but no syncope. His weight is up 9 lbs from discharge but this was taken when he was wearing a heavy coat. Home weights stable. He is tolerating triple therapy well with no abnormal bleeding in urine or stools. Of note, his has also had frequent fevers lately with no clear etiology during hospitalizations.   Patient notes mild pain at right femoral cath site when he takes his first couple of steps and then pain resolves. Pain is improving since discharge. Healing ecchymosis noted at cath site.  Past Medical History:  Diagnosis Date  . Aortic stenosis, moderate 09/20/2020  . Arthritis   . CAD (coronary artery disease)    a. Cath September 2015 LIMA to the LAD patent, SVG to PDA patent, SVG to posterior lateral patent, SVG to OM with a 90% in-stent restenosis at an anastomotic lesion. This was treated with angioplasty. b. cath 04/01/2015 95% ISR in SVG to OM treated with 2.75x24 Synergy DES postdilated to 3.22m, all other grafts patent  . Cataract    surgery,B/L  . CHF ([place]ive heart failure) ([place]   . Chronic kidney disease    nephrolithiasis  . Diabetes mellitus    TYPE 2  . GERD (gastroesophageal reflux disease)   . Hernia   . Hypercholesterolemia   . Hypertension   . Incontinence    hx  over 1 year,leaks without awareness  . Other and unspecified diseases of appendix   . PAF (paroxysmal atrial fibrillation) ([place]    in the  setting of ischemia on 03/31/2015  . Pancreatitis   . Prostate CA ([place] 03/01/04   prostate bx=Adenocarcinoam,gleason 3+4=7,PSA=6.75  . Shortness of breath dyspnea   . Ulcer    hx gastric    Past Surgical History:  Procedure Laterality Date  . APPENDECTOMY    . BACK SURGERY    . CARDIAC CATHETERIZATION N/A 04/01/2015   Procedure: Left Heart Cath and Cors/Grafts Angiography;  Surgeon: JJettie Booze MD;  [facility];  Service: Cardiovascular;  Laterality: N/A;  . CARDIAC CATHETERIZATION N/A 04/01/2015   Procedure: Coronary Stent Intervention;  Surgeon: JJettie Booze MD;  [facility];  Service: Cardiovascular;  Laterality: N/A;  . CARDIOVERSION N/A 11/01/2019   Procedure: CARDIOVERSION;  Surgeon: OGeralynn Rile MD;  [place] [facility]  Service: Cardiovascular;  Laterality: N/A;  . CARPAL TUNNEL RELEASE  2004   both hands  . CORONARY ARTERY BYPASS GRAFT    . CORONARY STENT INTERVENTION N/A 09/23/2020   Procedure: CORONARY STENT INTERVENTION;  Surgeon: CSherren Mocha MD;  [facility];  Service: Cardiovascular;  Laterality: N/A;  . CYSTOSCOPY  08/23/12   incomplete emoptying bladder  . HERNIA REPAIR    . incision and drainage of right chest abscess    . LAPAROSCOPIC CHOLECYSTECTOMY  09/2009  . LEFT HEART CATHETERIZATION WITH CORONARY  ANGIOGRAM N/A 07/19/2014   Procedure: LEFT HEART CATHETERIZATION WITH CORONARY ANGIOGRAM;  Surgeon: Leonie Man, MD;  Location[facility] C[facility] LAB;  Service: Cardiovascular;  Laterality: N/A;  . RECTAL SURGERY    . RIGHT/LEFT HEART CATH AND CORONARY/GRAFT ANGIOGRAPHY N/A 09/23/2020   Procedure: RIGHT/LEFT HEART CATH AND CORONARY/GRAFT ANGIOGRAPHY;  Surgeon: Sherren Mocha, MD;  Location: [place] [facility] LAB;  Service: Cardiovascular;  Laterality: N/A;  . ROBOT ASSISTED LAPAROSCOPIC RADICAL PROSTATECTOMY  2005    Current Medications: Current Meds  Medication Sig  . acetaminophen  (TYLENOL) 500 MG tablet Take 1,000 mg by mouth every 4 (four) hours as needed for fever or headache (pain).  Marland Kitchen albuterol (PROVENTIL) (2.5 MG/3ML) 0.083% nebulizer solution Take 3 mLs (2.5 mg total) by nebulization every 4 (four) hours as needed for wheezing or shortness of breath.  Marland Kitchen albuterol (VENTOLIN HFA) 108 (90 Base) MCG/ACT inhaler Inhale 2 puffs into the lungs every 6 (six) hours as needed.  Marland Kitchen amLODipine (NORVASC) 5 MG tablet Take 1 tablet (5 mg total) by mouth daily.  Marland Kitchen aspirin EC 81 MG tablet Take 81 mg by mouth daily. Swallow whole.  . blood glucose meter kit and supplies KIT Dispense based on patient and insurance preference. Use up to four times daily as directed. (FOR ICD-9 250.00, 250.01).  . budesonide-formoterol (SYMBICORT) 160-4.5 MCG/ACT inhaler Inhale 2 puffs into the lungs in the morning and at bedtime.  . carvedilol (COREG) 6.25 MG tablet TAKE 1 TABLET (6.25 MG TOTAL) BY MOUTH 2 (TWO) TIMES DAILY WITH A MEAL.  Marland Kitchen clopidogrel (PLAVIX) 75 MG tablet TAKE 1 TABLET (75 MG TOTAL) BY MOUTH DAILY. (Patient taking differently: Take 75 mg by mouth daily.)  . docusate sodium (STOOL SOFTENER) 100 MG capsule Take 1 capsule (100 mg total) by mouth 2 (two) times daily as needed for mild constipation. (Patient taking differently: Take 100 mg by mouth at bedtime.)  . ELIQUIS 2.5 MG TABS tablet TAKE 1 TABLET BY MOUTH TWICE A DAY (Patient taking differently: Take 2.5 mg by mouth 2 (two) times daily.)  . furosemide (LASIX) 40 MG tablet Take 1 tablet (40 mg total) by mouth daily.  Marland Kitchen guaiFENesin-dextromethorphan (ROBITUSSIN DM) 100-10 MG/5ML syrup Take 5 mLs by mouth every 6 (six) hours as needed for cough.  . hydrocortisone 2.5 % cream Apply 1 application topically daily as needed (facial breakouts).  . Multiple Vitamins-Minerals (ICAPS AREDS 2 PO) Take 1 capsule by mouth daily.  . pantoprazole (PROTONIX) 40 MG tablet TAKE 1 TABLET BY MOUTH EVERY DAY (Patient taking differently: Take 40 mg by mouth  daily.)  . rosuvastatin (CRESTOR) 20 MG tablet TAKE 1 TABLET BY MOUTH EVERY DAY (Patient taking differently: Take 20 mg by mouth daily.)  . sitaGLIPtin (JANUVIA) 50 MG tablet Take 1 tablet (50 mg total) by mouth daily.  Marland Kitchen spironolactone (ALDACTONE) 25 MG tablet TAKE 1 TABLET BY MOUTH EVERY DAY (Patient taking differently: Take 25 mg by mouth daily.)  . [DISCONTINUED] nitroGLYCERIN (NITROSTAT) 0.4 MG SL tablet Place 1 tablet (0.4 mg total) under the tongue every 5 (five) minutes x 3 doses as needed for chest pain (if no relief after 3rd dose, proceed to the ED for an evaluation).     Allergies:   Codeine and Morphine   Social History   Socioeconomic History  . Marital status: Married    Spouse name: Not on file  . Number of children: 2  . Years of education: Not on file  . Highest education level: Not on  file  Occupational History    Employer: RETIRED    Comment: Retired  Tobacco Use  . Smoking status: Former Smoker    Packs/day: 0.50    Years: 5.00    Pack years: 2.50    Types: Cigarettes    Quit date: 07/20/1972    Years since quitting: 48.2  . Smokeless tobacco: Never Used  . Tobacco comment: quit s  Vaping Use  . Vaping Use: Never used  Substance and Sexual Activity  . Alcohol use: No  . Drug use: No    Comment: quit smoking 40 years ago  . Sexual activity: Never  Other Topics Concern  . Not on file  Social History Narrative   Retired   Married      Social Determinants of Radio broadcast assistant Strain: Not on [employer] Insecurity: Not on file  Transportation Needs: Not on file  Physical Activity: Not on file  Stress: Not on file  Social Connections: Not on file     Family History: The patient's family history includes Cancer in his mother.  ROS:   Please see the history of present illness.     EKGs/Labs/Other Studies Reviewed:    The following studies were reviewed today:  Carotid Ultrasounds 03/28/2020: Summary:  - Right Carotid: Velocities in  the right ICA are consistent with a 1-39%  stenosis. Non-hemodynamically significant plaque <50% noted in the  CCA.  - Left Carotid: Velocities in the left ICA are consistent with a 60-79%  stenosis. Non-hemodynamically significant plaque <50% noted in the  CCA. The ECA appears >50% stenosed.  - Vertebrals: Bilateral vertebral arteries demonstrate antegrade flow.  - Subclavians: Bilateral subclavian artery flow was disturbed.   *See table(s) above for measurements and observations.  Suggest follow up study in 12 months.  _______________  Echocardiogram 09/08/2020: Impressions: 1. Left ventricular ejection fraction, by estimation, is 50 to 55%. The  left ventricle has low normal function. The left ventricle demonstrates  regional wall motion abnormalities (see scoring diagram/findings for  description). There is severe left  ventricular hypertrophy. Left ventricular diastolic function could not be  evaluated. There is severe hypokinesis of the left ventricular, basal-mid  inferior wall.  2. Right ventricular systolic function is moderately reduced. The right  ventricular size is normal.  3. Right atrial size was mildly dilated.  4. The mitral valve is abnormal. Trivial mitral valve regurgitation.  5. The aortic valve is calcified. Aortic valve regurgitation is not  visualized. Moderate to severe aortic valve stenosis. Aortic valve area,  by VTI measures 0.87 cm. Aortic valve mean gradient measures 22.0 mmHg.  Aortic valve Vmax measures 2.99 m/s. DI  of 0.25.  6. Aortic dilatation noted. There is mild dilatation of the ascending  aorta, measuring 40 mm.  7. The inferior vena cava is dilated in size with <50% respiratory  variability, suggesting right atrial pressure of 15 mmHg.   Comparison(s): Changes from prior study are noted. 07/27/2019: LVEF 60-65%, severe LVH, mild AS - peak and mean gradients of 15/26 mmHg.  _______________  Right/Left Cardiac Catheterization  09/23/2020:  Balloon angioplasty was performed using a BALLOON SAPPHIRE [place] 3.5X12.  Post intervention, there is a 20% residual stenosis.   1.  Severe native three-vessel coronary artery disease 2.  Status post aortocoronary bypass surgery with continued patency of the LIMA to LAD, saphenous vein graft to left posterior lateral branch, and saphenous vein graft to right PDA 3.  Severe in-stent restenosis in the saphenous vein  graft to obtuse marginal (2 layers of stent), treated successfully with noncompliant balloon angioplasty (3.5 mm noncompliant balloon to 22 atm) 4.  Moderate aortic stenosis with mean gradient 14 mmHg, calculated aortic valve area 1.37 cm, moderate amount of difficulty crossing the valve with a straight wire, suspect component of paradoxical low flow low gradient aortic stenosis 5.  Normal diastolic filling pressures in the right and left heart  Recommendations: Aspirin x30 days, clopidogrel 75 mg at least 1 month favor 6 months if tolerated, resume apixaban tomorrow  Total contrast: 40 cc  EKG:  EKG ordered today. EKG personally reviewed and demonstrates atrial fibrillation, rate 84 bpm, with known LBBB.  Recent Labs: 04/02/2020: Pro B Natriuretic peptide (BNP) 359.0 09/20/2020: B Natriuretic Peptide 685.4 09/21/2020: TSH 2.809 09/24/2020: ALT 41; BUN 22; Creatinine, Ser 1.84; Hemoglobin 12.1; Magnesium 1.9; Platelets 161; Potassium 4.1; Sodium 131  Recent Lipid Panel    Component Value Date/Time   CHOL 109 01/14/2020 0854   TRIG 132.0 01/14/2020 0854   TRIG 306 (HH) 09/27/2006 1154   HDL 26.50 (L) 01/14/2020 0854   CHOLHDL 4 01/14/2020 0854   VLDL 26.4 01/14/2020 0854   LDLCALC 56 01/14/2020 0854   LDLDIRECT 143.3 11/23/2007 0000    Physical Exam:    Vital Signs: BP 136/64 (BP Location: Left Arm, Patient Position: Sitting)   Pulse 84   Ht '5\' 9"'  (1.753 m)   Wt 178 lb 6.4 oz (80.9 kg)   SpO2 94%   BMI 26.35 kg/m     Wt Readings from Last 3 Encounters:   09/29/20 178 lb 6.4 oz (80.9 kg)  09/24/20 169 lb 8 oz (76.9 kg)  09/10/20 180 lb 6.4 oz (81.8 kg)     General: 86 y.o. male in no acute distress. HEENT: Normocephalic and atraumatic. Sclera clear. EOMs intact. Neck: Supple. No JVD. Heart: Irregularly irregular rhythm with normal rate. Distinct S1 and S2. II-III/VI systolic murmur. No gallops or rubs. Radial pulses 2+ and equal bilaterally. Right femoral cath site with healing ecchymosis.  Lungs: Mild increased work of breathing. Mild crackles noted in bases. No wheezes or rhonchi. Abdomen: Soft, non-distended, and non-tender to palpation.  Extremities: No lower extremity edema.    Skin: Warm and dry. Neuro: Alert and oriented x3. No focal deficits. Psych: Normal affect. Responds appropriately.   Assessment:    1. Coronary artery disease involving native heart without angina pectoris, unspecified vessel or lesion type   2. S/P CABG (coronary artery bypass graft)   3. Aortic valve stenosis, etiology of cardiac valve disease unspecified   4. Chronic diastolic CHF (congestive heart failure) (HCC)   5. Persistent atrial fibrillation ([place])   6. Bilateral carotid artery stenosis   7. Primary hypertension   8. Hyperlipidemia, unspecified hyperlipidemia type   9. Type 2 diabetes mellitus with complication, without long-term current use of insulin ([place])   10. CKD (chronic kidney disease), stage IV (HCC)     Plan:    CAD s/p CABG with Recent PCI - Patient has a long history of CAD s/p CABG x4 with multiple subsequent PCIs - most recently balloon angioplasty to in-stent restenosis of SVG-OM.  - He continues to have chest pain after becoming acute short of breath. No chest pain outside of these times.  - Right femoral cath site with healing ecchymosis. He has mild tenderness at site but states this is improving. - Plan is for triple therapy with Aspirin, Plavix, and Eliquis for 1 month at which time we will  stop Aspirin. Plavix recommended  for 6 months.  - Continue beta-blocker and high-intensity statin.  - Discussed with Dr. Percival Spanish - do not think CAD is driving factor of symptoms at this time.  - Will refill Nitro. Also OK to take Tylenol as needed since he states this help (advised not to exceed 3g per day). - Will recheck CBC today.  Moderate Aortic Stenosis - Noted to be moderate to severe on recent Echo in 08/2020. On cath, considered moderate with mean gradient of 14 mmHg; however, paroxysmal low flow low gradient AS suspected.  - Will message Structural Heart Team to make sure patient does not need follow-up with them.   Chronic Combined CHF - Echo in 08/2020 showed LVEF of 50-55% with severe hypokinesis of basal-mid inferior wall and severe LVH.  - Mild crackles on exam. Otherwise, does not appear volume overloaded on exam. - Currently on Lasix 25m daily (reduced from twice daily on recent discharge). - Continue Coreg 6.274mtwice daily and Spironolactone 2593maily. No ACEi/ARB due to renal function.  - Will recheck BMET today. If renal function stable, will increase Lasix to 45m44mice daily. - Discussed with Dr. HochPercival Spanishifficult to determine driving factor of patient's symptoms. Dr HochPercival Spanishees with increasing Lasix if renal function stable. No other recommendations at this time. - Discussed importance of daily weights and sodium/fluid restriction. Patient to call us [place] gains 3lb weight gain in 1 day or 5lb weight gain in 1 week. Recommended going to ED if breathing worsens prior to follow-up.  Persistent Atrial Fibrillation - EKG today showed rate controlled atrial fibrillation. Atrial fibrillation previously not felt to be cause of dyspnea.  - Continue Coreg 6.25mg32mce daily.  - Continue chronic anticoagulation with Eliquis 2.5mg t26me daily (reduced dose due to age and renal function).  Carotid Artery Disease - Carotid ultrasound in 03/2020 showed 1-39% stenosis of right ICA and 60-79% stenosis of  left ICA.  - Continue antiplatelet therapy and statin. - Plan is for repeat carotid ultrasound in 03/2021.   Hypertension - BP well controlled. - Continue current medications: Amlodipine 5mg da55m, Coreg 6.25mg tw11mdaily, and Spironolactone 25mg dai82m Hyperlipidemia - Continue Crestor 20mg dail35m Type 2 Diabetes Mellitus - Managed by PCP.  CKD Stage III-IV - Creatinine 1.84 on discharge. Baseline 1.7 to 2.1. - Will recheck BMET today.   Disposition: Patient already has close follow-up with PCP in 2 days and then follow-up with Dr. Hochrein nPercival Spanishh. Encouraged patient to keep both of these appointments.   ADDENDUM 09/30/2020 10:40AM: Discussed patient with Katie ThomNell Range[facility] Structural Heart Team. TAVR is on hold given moderate AS by cardiac cath. However, they will arrange repeat Echo in 6 month and then follow-up visit with Dr. McAlhany aAngelena Formt.   Medication Adjustments/Labs and Tests Ordered: Current medicines are reviewed at length with the patient today.  Concerns regarding medicines are outlined above.  Orders Placed This Encounter  Procedures  . Basic Metabolic Panel (BMET)  . CBC  . EKG 12-Lead   Meds ordered this encounter  Medications  . nitroGLYCERIN (NITROSTAT) 0.4 MG SL tablet    Sig: Place 1 tablet (0.4 mg total) under the tongue every 5 (five) minutes x 3 doses as needed for chest pain (if no relief after 3rd dose, proceed to the ED for an evaluation).    Dispense:  75 tablet    Refill:  1    Patient Instructions  Medication Instructions:  No changes *If you need a refill on your cardiac medications before your next appointment, please call your pharmacy*   Lab Work: BMP, CBC If you have labs (blood work) drawn today and your tests are completely normal, you will receive your results only by: Marland Kitchen MyChart Message (if you have MyChart) OR . A paper copy in the mail If you have any lab test that is abnormal or we need to change your  treatment, we will call you to review the results.   Testing/Procedures: No Testing   Follow-Up: At [facility], you and your health needs are our priority.  As part of our continuing mission to provide you with exceptional heart care, we have created designated Provider Care Teams.  These Care Teams include your primary Cardiologist (physician) and Advanced Practice Providers (APPs -  Physician Assistants and Nurse Practitioners) who all work together to provide you with the care you need, when you need it.  We recommend signing up for the patient portal called "MyChart".  Sign up information is provided on this After Visit Summary.  MyChart is used to connect with patients for Virtual Visits (Telemedicine).  Patients are able to view lab/test results, encounter notes, upcoming appointments, etc.  Non-urgent messages can be sent to your provider as well.   To learn more about what you can do with MyChart, go to [email or web address].    Your next appointment:   November 04, 2020 1:40 pm  The format for your next appointment:   In Person  Provider:   [name], MD  Monitor weight Daily. If weight is more than 3lbs. In a Day and 5lbs. In a week. Call office.     Signed, Shawn Mclean, PA-C  09/29/2020 1:21 PM    [facility]

## 2020-09-29 ENCOUNTER — Other Ambulatory Visit: Payer: Self-pay

## 2020-09-29 ENCOUNTER — Ambulatory Visit (INDEPENDENT_AMBULATORY_CARE_PROVIDER_SITE_OTHER): Payer: Medicare PPO | Admitting: Student

## 2020-09-29 ENCOUNTER — Encounter: Payer: Self-pay | Admitting: Student

## 2020-09-29 VITALS — BP 136/64 | HR 84 | Ht 69.0 in | Wt 178.4 lb

## 2020-09-29 DIAGNOSIS — I2510 Atherosclerotic heart disease of native coronary artery without angina pectoris: Secondary | ICD-10-CM

## 2020-09-29 DIAGNOSIS — I10 Essential (primary) hypertension: Secondary | ICD-10-CM | POA: Diagnosis not present

## 2020-09-29 DIAGNOSIS — I4819 Other persistent atrial fibrillation: Secondary | ICD-10-CM

## 2020-09-29 DIAGNOSIS — I6523 Occlusion and stenosis of bilateral carotid arteries: Secondary | ICD-10-CM

## 2020-09-29 DIAGNOSIS — I350 Nonrheumatic aortic (valve) stenosis: Secondary | ICD-10-CM | POA: Diagnosis not present

## 2020-09-29 DIAGNOSIS — N184 Chronic kidney disease, stage 4 (severe): Secondary | ICD-10-CM

## 2020-09-29 DIAGNOSIS — E785 Hyperlipidemia, unspecified: Secondary | ICD-10-CM | POA: Diagnosis not present

## 2020-09-29 DIAGNOSIS — E118 Type 2 diabetes mellitus with unspecified complications: Secondary | ICD-10-CM

## 2020-09-29 DIAGNOSIS — I5032 Chronic diastolic (congestive) heart failure: Secondary | ICD-10-CM | POA: Diagnosis not present

## 2020-09-29 DIAGNOSIS — Z951 Presence of aortocoronary bypass graft: Secondary | ICD-10-CM | POA: Diagnosis not present

## 2020-09-29 MED ORDER — NITROGLYCERIN 0.4 MG SL SUBL: 0.4000 mg | SUBLINGUAL_TABLET | SUBLINGUAL | 1 refills | Status: DC | PRN

## 2020-09-29 NOTE — Patient Instructions (Signed)
Medication Instructions:  No changes *If you need a refill on your cardiac medications before your next appointment, please call your pharmacy*   Lab Work: BMP, CBC If you have labs (blood work) drawn today and your tests are completely normal, you will receive your results only by: Marland Kitchen MyChart Message (if you have MyChart) OR . A paper copy in the mail If you have any lab test that is abnormal or we need to change your treatment, we will call you to review the results.   Testing/Procedures: No Testing   Follow-Up: At [facility], you and your health needs are our priority.  As part of our continuing mission to provide you with exceptional heart care, we have created designated Provider Care Teams.  These Care Teams include your primary Cardiologist (physician) and Advanced Practice Providers (APPs -  Physician Assistants and Nurse Practitioners) who all work together to provide you with the care you need, when you need it.  We recommend signing up for the patient portal called "MyChart".  Sign up information is provided on this After Visit Summary.  MyChart is used to connect with patients for Virtual Visits (Telemedicine).  Patients are able to view lab/test results, encounter notes, upcoming appointments, etc.  Non-urgent messages can be sent to your provider as well.   To learn more about what you can do with MyChart, go to [email or web address].    Your next appointment:   November 04, 2020 1:40 pm  The format for your next appointment:   In Person  Provider:   [name], MD  Monitor weight Daily. If weight is more than 3lbs. In a Day and 5lbs. In a week. Call office.

## 2020-09-30 ENCOUNTER — Other Ambulatory Visit: Payer: Self-pay | Admitting: Physician Assistant

## 2020-09-30 ENCOUNTER — Other Ambulatory Visit: Payer: Self-pay

## 2020-09-30 DIAGNOSIS — I2510 Atherosclerotic heart disease of native coronary artery without angina pectoris: Secondary | ICD-10-CM

## 2020-09-30 DIAGNOSIS — I350 Nonrheumatic aortic (valve) stenosis: Secondary | ICD-10-CM

## 2020-09-30 DIAGNOSIS — Z79899 Other long term (current) drug therapy: Secondary | ICD-10-CM

## 2020-09-30 LAB — BASIC METABOLIC PANEL
BUN/Creatinine Ratio: 13 (ref 10–24)
BUN: 23 mg/dL (ref 8–27)
CO2: 21 mmol/L (ref 20–29)
Calcium: 9.7 mg/dL (ref 8.6–10.2)
Chloride: 94 mmol/L — ABNORMAL LOW (ref 96–106)
Creatinine, Ser: 1.78 mg/dL — ABNORMAL HIGH (ref 0.76–1.27)
GFR calc Af Amer: 39 mL/min/{1.73_m2} — ABNORMAL LOW (ref >59)
GFR calc non Af Amer: 34 mL/min/{1.73_m2} — ABNORMAL LOW (ref >59)
Glucose: 204 mg/dL — ABNORMAL HIGH (ref 65–99)
Potassium: 5 mmol/L (ref 3.5–5.2)
Sodium: 130 mmol/L — ABNORMAL LOW (ref 134–144)

## 2020-09-30 LAB — CBC
Hematocrit: 38.1 % (ref 37.5–51.0)
Hemoglobin: 13.2 g/dL (ref 13.0–17.7)
MCH: 30 pg (ref 26.6–33.0)
MCHC: 34.6 g/dL (ref 31.5–35.7)
MCV: 87 fL (ref 79–97)
Platelets: 176 10*3/uL (ref 150–450)
RBC: 4.4 x10E6/uL (ref 4.14–5.80)
RDW: 12 % (ref 11.6–15.4)
WBC: 12.2 10*3/uL — ABNORMAL HIGH (ref 3.4–10.8)

## 2020-09-30 MED ORDER — FUROSEMIDE 40 MG PO TABS
40.0000 mg | ORAL_TABLET | Freq: Two times a day (BID) | ORAL | 3 refills | Status: DC
Start: 2020-09-30 — End: 2021-05-21

## 2020-10-01 ENCOUNTER — Ambulatory Visit: Payer: Medicare PPO | Admitting: Family Medicine

## 2020-10-01 ENCOUNTER — Other Ambulatory Visit: Payer: Self-pay

## 2020-10-01 ENCOUNTER — Encounter: Payer: Self-pay | Admitting: Family Medicine

## 2020-10-01 VITALS — BP 108/52 | HR 88 | Temp 97.8°F | Ht 69.0 in | Wt 171.8 lb

## 2020-10-01 DIAGNOSIS — E871 Hypo-osmolality and hyponatremia: Secondary | ICD-10-CM | POA: Diagnosis not present

## 2020-10-01 DIAGNOSIS — I350 Nonrheumatic aortic (valve) stenosis: Secondary | ICD-10-CM | POA: Diagnosis not present

## 2020-10-01 DIAGNOSIS — I25118 Atherosclerotic heart disease of native coronary artery with other forms of angina pectoris: Secondary | ICD-10-CM | POA: Diagnosis not present

## 2020-10-01 DIAGNOSIS — D72829 Elevated white blood cell count, unspecified: Secondary | ICD-10-CM | POA: Diagnosis not present

## 2020-10-01 DIAGNOSIS — R4589 Other symptoms and signs involving emotional state: Secondary | ICD-10-CM

## 2020-10-01 DIAGNOSIS — E1121 Type 2 diabetes mellitus with diabetic nephropathy: Secondary | ICD-10-CM

## 2020-10-01 MED ORDER — ESCITALOPRAM OXALATE 5 MG PO TABS: 5.0000 mg | ORAL_TABLET | Freq: Every day | ORAL | 5 refills | Status: DC

## 2020-10-01 NOTE — Progress Notes (Signed)
Established Patient Office Visit  Subjective:  Patient ID: [name], male    DOB: 07-06-1933  Age: 86 y.o. MRN: [id number]  CC:  Chief Complaint  Patient presents with  . Hospitalization Follow-up    HPI [name] presents for hospital follow-up.  He has multiple chronic problems including history of CAD, diastolic heart failure, atrial fibrillation, hypertension, COPD, type 2 diabetes, chronic kidney disease, history of prostate cancer.  He has been battling with progressive shortness of breath really for years.  He had admission November 21 for presumed COPD exacerbation and question of CHF.  Echo was stable at that point.  He had some progression of aortic stenosis.  He was then readmitted on December 4 with progressive dyspnea and some chest pressure and question of low-grade fever.  Underwent cardiac cath on 7 December which showed severe native three-vessel coronary disease with patency of LIMA to LAD, severe in-stent restenosis in the saphenous vein graft and this was treated with balloon angioplasty.  Moderate aortic stenosis.  Patient at this point is on aspirin 81 mg daily, Plavix 75 mg daily, low dose Eliquis and continued Coreg.  He remains on Lasix 40 mg daily and Aldactone.  He had some recent abnormal labs with a sodium of 130 but at that point his blood sugars are running higher than usual.  Recent A1c 7.3%.  No thiazides.  Fair oral intake.  Blood sugars have improved some past few days.  He also was told to follow-up here regarding CBC results 2 days ago with white count 12.2 thousand with no differential.  He denies any chills, fever, cough, dysuria.  He did have some low-grade fever on recent admission but no source of infection found.  He was treated with [facility] and is off antibiotics at this time.  He relates depressed mood.  He has increased anxiousness.  Remotely took Xanax but we got him off this because of risk of falls.  He feels that he is having some  increasing depression symptoms.  He would like to explore options for treatment.  Past Medical History:  Diagnosis Date  . Aortic stenosis, moderate 09/20/2020  . Arthritis   . CAD (coronary artery disease)    a. Cath September 2015 LIMA to the LAD patent, SVG to PDA patent, SVG to posterior lateral patent, SVG to OM with a 90% in-stent restenosis at an anastomotic lesion. This was treated with angioplasty. b. cath 04/01/2015 95% ISR in SVG to OM treated with 2.75x24 Synergy DES postdilated to 3.21mm, all other grafts patent  . Cataract    surgery,B/L  . CHF (congestive heart failure) ([place])   . Chronic kidney disease    nephrolithiasis  . Diabetes mellitus    TYPE 2  . GERD (gastroesophageal reflux disease)   . Hernia   . Hypercholesterolemia   . Hypertension   . Incontinence    hx  over 1 year,leaks without awareness  . Other and unspecified diseases of appendix   . PAF (paroxysmal atrial fibrillation) ([place])    in the setting of ischemia on 03/31/2015  . Pancreatitis   . Prostate CA ([place]) 03/01/04   prostate bx=Adenocarcinoam,gleason 3+4=7,PSA=6.75  . Shortness of breath dyspnea   . Ulcer    hx gastric    Past Surgical History:  Procedure Laterality Date  . APPENDECTOMY    . BACK SURGERY    . CARDIAC CATHETERIZATION N/A 04/01/2015   Procedure: Left Heart Cath and Cors/Grafts Angiography;  Surgeon: Charlann Lange  Irish Lack, MD;  Location: [place] [facility];  Service: Cardiovascular;  Laterality: N/A;  . CARDIAC CATHETERIZATION N/A 04/01/2015   Procedure: Coronary Stent Intervention;  Surgeon: Jettie Booze, MD;  Location: [place] [facility];  Service: Cardiovascular;  Laterality: N/A;  . CARDIOVERSION N/A 11/01/2019   Procedure: CARDIOVERSION;  Surgeon: Geralynn Rile, MD;  Location: [place];  Service: Cardiovascular;  Laterality: N/A;  . CARPAL TUNNEL RELEASE  2004   both hands  . CORONARY ARTERY BYPASS GRAFT    . CORONARY STENT INTERVENTION N/A 09/23/2020    Procedure: CORONARY STENT INTERVENTION;  Surgeon: Sherren Mocha, MD;  Location: [place] [facility];  Service: Cardiovascular;  Laterality: N/A;  . CYSTOSCOPY  08/23/12   incomplete emoptying bladder  . HERNIA REPAIR    . incision and drainage of right chest abscess    . LAPAROSCOPIC CHOLECYSTECTOMY  09/2009  . LEFT HEART CATHETERIZATION WITH CORONARY ANGIOGRAM N/A 07/19/2014   Procedure: LEFT HEART CATHETERIZATION WITH CORONARY ANGIOGRAM;  Surgeon: Leonie Man, MD;  Location[facility];  Service: Cardiovascular;  Laterality: N/A;  . RECTAL SURGERY    . RIGHT/LEFT HEART CATH AND CORONARY/GRAFT ANGIOGRAPHY N/A 09/23/2020   Procedure: RIGHT/LEFT HEART CATH AND CORONARY/GRAFT ANGIOGRAPHY;  Surgeon: Sherren Mocha, MD;  Location: [place] [facility];  Service: Cardiovascular;  Laterality: N/A;  . ROBOT ASSISTED LAPAROSCOPIC RADICAL PROSTATECTOMY  2005    Family History  Problem Relation Age of Onset  . Cancer Mother        stomach    Social History   Socioeconomic History  . Marital status: Married    Spouse name: Not on file  . Number of children: 2  . Years of education: Not on file  . Highest education level: Not on file  Occupational History    Employer: RETIRED    Comment: Retired  Tobacco Use  . Smoking status: Former Smoker    Packs/day: 0.50    Years: 5.00    Pack years: 2.50    Types: Cigarettes    Quit date: 07/20/1972    Years since quitting: 48.2  . Smokeless tobacco: Never Used  . Tobacco comment: quit s  Vaping Use  . Vaping Use: Never used  Substance and Sexual Activity  . Alcohol use: No  . Drug use: No    Comment: quit smoking 40 years ago  . Sexual activity: Never  Other Topics Concern  . Not on file  Social History Narrative   Retired   Married      Social Determinants of Radio broadcast assistant Strain: Not on [employer] Insecurity: Not on file  Transportation Needs: Not on file  Physical Activity: Not on file  Stress: Not on file   Social Connections: Not on file  Intimate Partner Violence: Not on file    Outpatient Medications Prior to Visit  Medication Sig Dispense Refill  . acetaminophen (TYLENOL) 500 MG tablet Take 1,000 mg by mouth every 4 (four) hours as needed for fever or headache (pain).    Marland Kitchen albuterol (PROVENTIL) (2.5 MG/3ML) 0.083% nebulizer solution Take 3 mLs (2.5 mg total) by nebulization every 4 (four) hours as needed for wheezing or shortness of breath. 75 mL 3  . albuterol (VENTOLIN HFA) 108 (90 Base) MCG/ACT inhaler Inhale 2 puffs into the lungs every 6 (six) hours as needed. 18 g 5  . amLODipine (NORVASC) 5 MG tablet Take 1 tablet (5 mg total) by mouth daily. 90 tablet 3  .  aspirin EC 81 MG tablet Take 81 mg by mouth daily. Swallow whole.    . blood glucose meter kit and supplies KIT Dispense based on patient and insurance preference. Use up to four times daily as directed. (FOR ICD-9 250.00, 250.01). 1 each 0  . budesonide-formoterol (SYMBICORT) 160-4.5 MCG/ACT inhaler Inhale 2 puffs into the lungs in the morning and at bedtime. 1 each 12  . carvedilol (COREG) 6.25 MG tablet TAKE 1 TABLET (6.25 MG TOTAL) BY MOUTH 2 (TWO) TIMES DAILY WITH A MEAL. 180 tablet 3  . clopidogrel (PLAVIX) 75 MG tablet TAKE 1 TABLET (75 MG TOTAL) BY MOUTH DAILY. (Patient taking differently: Take 75 mg by mouth daily.) 90 tablet 3  . docusate sodium (STOOL SOFTENER) 100 MG capsule Take 1 capsule (100 mg total) by mouth 2 (two) times daily as needed for mild constipation. (Patient taking differently: Take 100 mg by mouth at bedtime.) 30 capsule 0  . ELIQUIS 2.5 MG TABS tablet TAKE 1 TABLET BY MOUTH TWICE A DAY (Patient taking differently: Take 2.5 mg by mouth 2 (two) times daily.) 180 tablet 1  . furosemide (LASIX) 40 MG tablet Take 1 tablet (40 mg total) by mouth in the morning and at bedtime. 180 tablet 3  . guaiFENesin-dextromethorphan (ROBITUSSIN DM) 100-10 MG/5ML syrup Take 5 mLs by mouth every 6 (six) hours as needed for  cough. 118 mL 0  . hydrocortisone 2.5 % cream Apply 1 application topically daily as needed (facial breakouts).    . Multiple Vitamins-Minerals (ICAPS AREDS 2 PO) Take 1 capsule by mouth daily.    . nitroGLYCERIN (NITROSTAT) 0.4 MG SL tablet Place 1 tablet (0.4 mg total) under the tongue every 5 (five) minutes x 3 doses as needed for chest pain (if no relief after 3rd dose, proceed to the ED for an evaluation). 75 tablet 1  . pantoprazole (PROTONIX) 40 MG tablet TAKE 1 TABLET BY MOUTH EVERY DAY (Patient taking differently: Take 40 mg by mouth daily.) 90 tablet 1  . sitaGLIPtin (JANUVIA) 50 MG tablet Take 1 tablet (50 mg total) by mouth daily. 90 tablet 3  . spironolactone (ALDACTONE) 25 MG tablet TAKE 1 TABLET BY MOUTH EVERY DAY (Patient taking differently: Take 25 mg by mouth daily.) 90 tablet 2  . rosuvastatin (CRESTOR) 20 MG tablet TAKE 1 TABLET BY MOUTH EVERY DAY (Patient taking differently: Take 20 mg by mouth daily.) 90 tablet 0   No facility-administered medications prior to visit.    Allergies  Allergen Reactions  . Codeine Other (See Comments)    Makes him feel goofy  . Morphine Other (See Comments)    Nightmares and felt bad    ROS Review of Systems    Objective:    Physical Exam  BP (!) 108/52 (BP Location: Left Arm, Patient Position: Sitting, Cuff Size: Normal)   Pulse 88   Temp 97.8 F (36.6 C) (Oral)   Ht $R'5\' 9"'nN$  (1.753 m)   Wt 171 lb 12.8 oz (77.9 kg)   SpO2 97%   BMI 25.37 kg/m  Wt Readings from Last 3 Encounters:  10/01/20 171 lb 12.8 oz (77.9 kg)  09/29/20 178 lb 6.4 oz (80.9 kg)  09/24/20 169 lb 8 oz (76.9 kg)     Health Maintenance Due  Topic Date Due  . OPHTHALMOLOGY EXAM  03/11/2017    There are no preventive care reminders to display for this patient.  Lab Results  Component Value Date   TSH 2.809 09/21/2020   Lab Results  Component Value Date   WBC 12.2 (H) 09/29/2020   HGB 13.2 09/29/2020   HCT 38.1 09/29/2020   MCV 87 09/29/2020    PLT 176 09/29/2020   Lab Results  Component Value Date   NA 130 (L) 09/29/2020   K 5.0 09/29/2020   CO2 21 09/29/2020   GLUCOSE 204 (H) 09/29/2020   BUN 23 09/29/2020   CREATININE 1.78 (H) 09/29/2020   BILITOT 1.6 (H) 09/24/2020   ALKPHOS 80 09/24/2020   AST 21 09/24/2020   ALT 41 09/24/2020   PROT 6.2 (L) 09/24/2020   ALBUMIN 2.8 (L) 09/24/2020   CALCIUM 9.7 09/29/2020   ANIONGAP 12 09/24/2020   GFR 32.30 (L) 01/14/2020   Lab Results  Component Value Date   CHOL 109 01/14/2020   Lab Results  Component Value Date   HDL 26.50 (L) 01/14/2020   Lab Results  Component Value Date   LDLCALC 56 01/14/2020   Lab Results  Component Value Date   TRIG 132.0 01/14/2020   Lab Results  Component Value Date   CHOLHDL 4 01/14/2020   Lab Results  Component Value Date   HGBA1C 7.3 (H) 09/07/2020      Assessment & Plan:   #1 history of severe three-vessel coronary artery disease with recent angioplasty of in-stent restenosis of saphenous vein graft.  Patient has chronic dyspnea which is unchanged.  No current chest pain.  #2 recent mild hyponatremia.  Suspect at least some element of pseudohyponatremia with recent blood sugars consistently over 200s.  These are improved today with fasting this morning 140.  Recheck basic metabolic panel  #3 recent mild leukocytosis.  Currently denies any fevers or chills.  We explained this is a nonspecific finding.  Could indicate infection but at this point would just recheck CBC with dif to look and see if this is trending up.  Follow-up immediately for any fevers or chills.  #4 type 2 diabetes.  Recent A1c on 09/07/2020 7.3% -Continue Januvia. -Consider once daily long-acting insulin if blood sugars fasting continue over 130 over the next week  #5 depressed mood.  Is also having increased anxiousness.  We discussed starting low-dose Lexapro 5 mg once daily and reassess at follow-up in January  Meds ordered this encounter  Medications  .  escitalopram (LEXAPRO) 5 MG tablet    Sig: Take 1 tablet (5 mg total) by mouth daily.    Dispense:  30 tablet    Refill:  5    Follow-up: Return in about 1 month (around 11/01/2020).    Carolann Littler, MD

## 2020-10-02 ENCOUNTER — Telehealth: Payer: Self-pay | Admitting: Cardiology

## 2020-10-02 LAB — BASIC METABOLIC PANEL
BUN/Creatinine Ratio: 16 (calc) (ref 6–22)
BUN: 32 mg/dL — ABNORMAL HIGH (ref 7–25)
CO2: 26 mmol/L (ref 20–32)
Calcium: 9.4 mg/dL (ref 8.6–10.3)
Chloride: 97 mmol/L — ABNORMAL LOW (ref 98–110)
Creat: 1.95 mg/dL — ABNORMAL HIGH (ref 0.70–1.11)
Glucose, Bld: 183 mg/dL — ABNORMAL HIGH (ref 65–99)
Potassium: 4.3 mmol/L (ref 3.5–5.3)
Sodium: 133 mmol/L — ABNORMAL LOW (ref 135–146)

## 2020-10-02 LAB — CBC WITH DIFFERENTIAL/PLATELET
Absolute Monocytes: 922 cells/uL (ref 200–950)
Basophils Absolute: 53 cells/uL (ref 0–200)
Basophils Relative: 0.5 %
Eosinophils Absolute: 541 cells/uL — ABNORMAL HIGH (ref 15–500)
Eosinophils Relative: 5.1 %
HCT: 38.6 % (ref 38.5–50.0)
Hemoglobin: 13 g/dL — ABNORMAL LOW (ref 13.2–17.1)
Lymphs Abs: 816 cells/uL — ABNORMAL LOW (ref 850–3900)
MCH: 30.1 pg (ref 27.0–33.0)
MCHC: 33.7 g/dL (ref 32.0–36.0)
MCV: 89.4 fL (ref 80.0–100.0)
MPV: 9.5 fL (ref 7.5–12.5)
Monocytes Relative: 8.7 %
Neutro Abs: 8268 cells/uL — ABNORMAL HIGH (ref 1500–7800)
Neutrophils Relative %: 78 %
Platelets: 206 10*3/uL (ref 140–400)
RBC: 4.32 10*6/uL (ref 4.20–5.80)
RDW: 12.2 % (ref 11.0–15.0)
Total Lymphocyte: 7.7 %
WBC: 10.6 10*3/uL (ref 3.8–10.8)

## 2020-10-02 NOTE — Telephone Encounter (Signed)
Per wife, patient had his lab work done yesterday at Dr. Erick Blinks office. You can see results in the system.

## 2020-10-03 NOTE — Telephone Encounter (Signed)
I did review his labs and they were resulted by Dr. Elease Hashimoto.

## 2020-10-06 ENCOUNTER — Telehealth (HOSPITAL_COMMUNITY): Payer: Self-pay

## 2020-10-06 NOTE — Telephone Encounter (Signed)
Called and spoke with pt in regards to CR, pt stated he is not interested at this time.   Closed referral 

## 2020-10-22 DIAGNOSIS — H353132 Nonexudative age-related macular degeneration, bilateral, intermediate dry stage: Secondary | ICD-10-CM | POA: Diagnosis not present

## 2020-10-22 DIAGNOSIS — H1820 Unspecified corneal edema: Secondary | ICD-10-CM | POA: Diagnosis not present

## 2020-10-22 DIAGNOSIS — H04123 Dry eye syndrome of bilateral lacrimal glands: Secondary | ICD-10-CM | POA: Diagnosis not present

## 2020-10-22 DIAGNOSIS — H5203 Hypermetropia, bilateral: Secondary | ICD-10-CM | POA: Diagnosis not present

## 2020-10-23 ENCOUNTER — Other Ambulatory Visit: Payer: Self-pay | Admitting: Family Medicine

## 2020-10-29 ENCOUNTER — Encounter: Payer: Self-pay | Admitting: Family Medicine

## 2020-10-29 ENCOUNTER — Other Ambulatory Visit: Payer: Self-pay

## 2020-10-29 ENCOUNTER — Ambulatory Visit: Payer: Medicare PPO | Admitting: Family Medicine

## 2020-10-29 VITALS — BP 116/62 | HR 60 | Temp 97.6°F | Ht 69.0 in | Wt 175.6 lb

## 2020-10-29 DIAGNOSIS — R4589 Other symptoms and signs involving emotional state: Secondary | ICD-10-CM | POA: Diagnosis not present

## 2020-10-29 DIAGNOSIS — I10 Essential (primary) hypertension: Secondary | ICD-10-CM | POA: Diagnosis not present

## 2020-10-29 DIAGNOSIS — L821 Other seborrheic keratosis: Secondary | ICD-10-CM | POA: Diagnosis not present

## 2020-10-29 DIAGNOSIS — I2510 Atherosclerotic heart disease of native coronary artery without angina pectoris: Secondary | ICD-10-CM | POA: Diagnosis not present

## 2020-10-29 MED ORDER — FLUOXETINE HCL 10 MG PO CAPS: 10.0000 mg | ORAL_CAPSULE | Freq: Every day | ORAL | 3 refills | Status: DC

## 2020-10-29 NOTE — Progress Notes (Signed)
Established Patient Office Visit  Subjective:  Patient ID: [name], male    DOB: 1933-04-12  Age: 87 y.o. MRN: [id number]  CC:  Chief Complaint  Patient presents with  . Follow-up    HPI [name] presents for follow-up from visit last month.  Refer to that note for details.  He had recent hospitalization..  Cardiac cath December 7 which showed severe native three-vessel coronary disease with patency of LIMA to LAD and severe in-stent stenosis in the saphenous vein graft and that was treated with angioplasty.  Medications reviewed.  Compliant with all.  No recent chest pain.  He has some chronic dyspnea unchanged.  He had some recent depression the symptoms.  We started Lexapro low-dose 5 mg daily.  He was fairly convinced that he felt more confused and dizzy when taking this.  He stopped this about 4 days ago.  Still has depressed mood.  No suicidal ideation.  Years ago took Xanax but we took him off for concerns for risk of falls.  Type 2 diabetes.  Last A1c 7.3% back in November.  CBGs have been trending down and recently run 130 fasting.  Brownish blackish colored skin lesion right upper chest wall.  This been present for some time and slowly growing in size.  Sometimes itches.  Past Medical History:  Diagnosis Date  . Aortic stenosis, moderate 09/20/2020  . Arthritis   . CAD (coronary artery disease)    a. Cath September 2015 LIMA to the LAD patent, SVG to PDA patent, SVG to posterior lateral patent, SVG to OM with a 90% in-stent restenosis at an anastomotic lesion. This was treated with angioplasty. b. cath 04/01/2015 95% ISR in SVG to OM treated with 2.75x24 Synergy DES postdilated to 3.73m, all other grafts patent  . Cataract    surgery,B/L  . CHF (congestive heart failure) ([facility]   . Chronic kidney disease    nephrolithiasis  . Diabetes mellitus    TYPE 2  . GERD (gastroesophageal reflux disease)   . Hernia   . Hypercholesterolemia   . Hypertension   .  Incontinence    hx  over 1 year,leaks without awareness  . Other and unspecified diseases of appendix   . PAF (paroxysmal atrial fibrillation) ([facility]    in the setting of ischemia on 03/31/2015  . Pancreatitis   . Prostate CA ([facility] 03/01/04   prostate bx=Adenocarcinoam,gleason 3+4=7,PSA=6.75  . Shortness of breath dyspnea   . Ulcer    hx gastric    Past Surgical History:  Procedure Laterality Date  . APPENDECTOMY    . BACK SURGERY    . CARDIAC CATHETERIZATION N/A 04/01/2015   Procedure: Left Heart Cath and Cors/Grafts Angiography;  Surgeon: JJettie Booze MD;  [facility];  Service: Cardiovascular;  Laterality: N/A;  . CARDIAC CATHETERIZATION N/A 04/01/2015   Procedure: Coronary Stent Intervention;  Surgeon: JJettie Booze MD;  [facility];  Service: Cardiovascular;  Laterality: N/A;  . CARDIOVERSION N/A 11/01/2019   Procedure: CARDIOVERSION;  Surgeon: OGeralynn Rile MD;  [facility]  Service: Cardiovascular;  Laterality: N/A;  . CARPAL TUNNEL RELEASE  2004   both hands  . CORONARY ARTERY BYPASS GRAFT    . CORONARY STENT INTERVENTION N/A 09/23/2020   Procedure: CORONARY STENT INTERVENTION;  Surgeon: CSherren Mocha MD;  [facility];  Service: Cardiovascular;  Laterality: N/A;  . CYSTOSCOPY  08/23/12   incomplete emoptying bladder  .  HERNIA REPAIR    . incision and drainage of right chest abscess    . LAPAROSCOPIC CHOLECYSTECTOMY  09/2009  . LEFT HEART CATHETERIZATION WITH CORONARY ANGIOGRAM N/A 07/19/2014   Procedure: LEFT HEART CATHETERIZATION WITH CORONARY ANGIOGRAM;  Surgeon: Leonie Man, MD;  Location: [facility] LAB;  Service: Cardiovascular;  Laterality: N/A;  . RECTAL SURGERY    . RIGHT/LEFT HEART CATH AND CORONARY/GRAFT ANGIOGRAPHY N/A 09/23/2020   Procedure: RIGHT/LEFT HEART CATH AND CORONARY/GRAFT ANGIOGRAPHY;  Surgeon: Sherren Mocha, MD;  Location: [place] [facility];  Service: Cardiovascular;   Laterality: N/A;  . ROBOT ASSISTED LAPAROSCOPIC RADICAL PROSTATECTOMY  2005    Family History  Problem Relation Age of Onset  . Cancer Mother        stomach    Social History   Socioeconomic History  . Marital status: Married    Spouse name: Not on file  . Number of children: 2  . Years of education: Not on file  . Highest education level: Not on file  Occupational History    Employer: RETIRED    Comment: Retired  Tobacco Use  . Smoking status: Former Smoker    Packs/day: 0.50    Years: 5.00    Pack years: 2.50    Types: Cigarettes    Quit date: 07/20/1972    Years since quitting: 48.3  . Smokeless tobacco: Never Used  . Tobacco comment: quit s  Vaping Use  . Vaping Use: Never used  Substance and Sexual Activity  . Alcohol use: No  . Drug use: No    Comment: quit smoking 40 years ago  . Sexual activity: Never  Other Topics Concern  . Not on file  Social History Narrative   Retired   Married      Social Determinants of Radio broadcast assistant Strain: Not on [employer] Insecurity: Not on file  Transportation Needs: Not on file  Physical Activity: Not on file  Stress: Not on file  Social Connections: Not on file  Intimate Partner Violence: Not on file    Outpatient Medications Prior to Visit  Medication Sig Dispense Refill  . acetaminophen (TYLENOL) 500 MG tablet Take 1,000 mg by mouth every 4 (four) hours as needed for fever or headache (pain).    Marland Kitchen albuterol (PROVENTIL) (2.5 MG/3ML) 0.083% nebulizer solution Take 3 mLs (2.5 mg total) by nebulization every 4 (four) hours as needed for wheezing or shortness of breath. 75 mL 3  . albuterol (VENTOLIN HFA) 108 (90 Base) MCG/ACT inhaler Inhale 2 puffs into the lungs every 6 (six) hours as needed. 18 g 5  . amLODipine (NORVASC) 5 MG tablet Take 1 tablet (5 mg total) by mouth daily. 90 tablet 3  . aspirin EC 81 MG tablet Take 81 mg by mouth daily. Swallow whole.    . blood glucose meter kit and supplies KIT  Dispense based on patient and insurance preference. Use up to four times daily as directed. (FOR ICD-9 250.00, 250.01). 1 each 0  . budesonide-formoterol (SYMBICORT) 160-4.5 MCG/ACT inhaler Inhale 2 puffs into the lungs in the morning and at bedtime. 1 each 12  . carvedilol (COREG) 6.25 MG tablet TAKE 1 TABLET (6.25 MG TOTAL) BY MOUTH 2 (TWO) TIMES DAILY WITH A MEAL. 180 tablet 3  . clopidogrel (PLAVIX) 75 MG tablet TAKE 1 TABLET (75 MG TOTAL) BY MOUTH DAILY. (Patient taking differently: Take 75 mg by mouth daily.) 90 tablet 3  . docusate sodium (STOOL SOFTENER) 100 MG  capsule Take 1 capsule (100 mg total) by mouth 2 (two) times daily as needed for mild constipation. (Patient taking differently: Take 100 mg by mouth at bedtime.) 30 capsule 0  . ELIQUIS 2.5 MG TABS tablet TAKE 1 TABLET BY MOUTH TWICE A DAY (Patient taking differently: Take 2.5 mg by mouth 2 (two) times daily.) 180 tablet 1  . furosemide (LASIX) 40 MG tablet Take 1 tablet (40 mg total) by mouth in the morning and at bedtime. 180 tablet 3  . guaiFENesin-dextromethorphan (ROBITUSSIN DM) 100-10 MG/5ML syrup Take 5 mLs by mouth every 6 (six) hours as needed for cough. 118 mL 0  . hydrocortisone 2.5 % cream Apply 1 application topically daily as needed (facial breakouts).    . Multiple Vitamins-Minerals (ICAPS AREDS 2 PO) Take 1 capsule by mouth daily.    . nitroGLYCERIN (NITROSTAT) 0.4 MG SL tablet Place 1 tablet (0.4 mg total) under the tongue every 5 (five) minutes x 3 doses as needed for chest pain (if no relief after 3rd dose, proceed to the ED for an evaluation). 75 tablet 1  . pantoprazole (PROTONIX) 40 MG tablet TAKE 1 TABLET BY MOUTH EVERY DAY (Patient taking differently: Take 40 mg by mouth daily.) 90 tablet 1  . rosuvastatin (CRESTOR) 20 MG tablet TAKE 1 TABLET BY MOUTH EVERY DAY (Patient taking differently: Take 20 mg by mouth daily.) 90 tablet 0  . sitaGLIPtin (JANUVIA) 50 MG tablet Take 1 tablet (50 mg total) by mouth daily. 90  tablet 3  . spironolactone (ALDACTONE) 25 MG tablet TAKE 1 TABLET BY MOUTH EVERY DAY (Patient taking differently: Take 25 mg by mouth daily.) 90 tablet 2  . escitalopram (LEXAPRO) 5 MG tablet TAKE 1 TABLET (5 MG TOTAL) BY MOUTH DAILY. 90 tablet 2   No facility-administered medications prior to visit.    Allergies  Allergen Reactions  . Codeine Other (See Comments)    Makes him feel goofy  . Morphine Other (See Comments)    Nightmares and felt bad    ROS Review of Systems  Constitutional: Negative for fatigue and unexpected weight change.  Eyes: Negative for visual disturbance.  Respiratory: Negative for cough, chest tightness and shortness of breath.   Cardiovascular: Negative for chest pain, palpitations and leg swelling.  Endocrine: Negative for polydipsia.  Neurological: Negative for dizziness, syncope, weakness, light-headedness and headaches.      Objective:    Physical Exam Constitutional:      Appearance: He is well-developed and well-nourished.  HENT:     Right Ear: External ear normal.     Left Ear: External ear normal.     Mouth/Throat:     Mouth: Oropharynx is clear and moist.  Eyes:     Pupils: Pupils are equal, round, and reactive to light.  Neck:     Thyroid: No thyromegaly.  Cardiovascular:     Rate and Rhythm: Normal rate.  Pulmonary:     Effort: Pulmonary effort is normal. No respiratory distress.     Breath sounds: Normal breath sounds. No wheezing or rales.  Musculoskeletal:        General: No edema.     Cervical back: Neck supple.  Skin:    Comments: Well-demarcated dark brown scaly skin lesion right upper chest wall about 1 cm diameter.  Neurological:     Mental Status: He is alert and oriented to person, place, and time.     BP 116/62 (BP Location: Left Arm, Patient Position: Sitting, Cuff Size: Normal)  Pulse 60   Temp 97.6 F (36.4 C) (Oral)   Ht 5' 9" (1.753 m)   Wt 175 lb 9.6 oz (79.7 kg)   SpO2 99%   BMI 25.93 kg/m  Wt  Readings from Last 3 Encounters:  10/29/20 175 lb 9.6 oz (79.7 kg)  10/01/20 171 lb 12.8 oz (77.9 kg)  09/29/20 178 lb 6.4 oz (80.9 kg)     Health Maintenance Due  Topic Date Due  . COVID-19 Vaccine (3 - Pfizer risk 4-dose series) 01/12/2020    There are no preventive care reminders to display for this patient.  Lab Results  Component Value Date   TSH 2.809 09/21/2020   Lab Results  Component Value Date   WBC 10.6 10/01/2020   HGB 13.0 (L) 10/01/2020   HCT 38.6 10/01/2020   MCV 89.4 10/01/2020   PLT 206 10/01/2020   Lab Results  Component Value Date   NA 133 (L) 10/01/2020   K 4.3 10/01/2020   CO2 26 10/01/2020   GLUCOSE 183 (H) 10/01/2020   BUN 32 (H) 10/01/2020   CREATININE 1.95 (H) 10/01/2020   BILITOT 1.6 (H) 09/24/2020   ALKPHOS 80 09/24/2020   AST 21 09/24/2020   ALT 41 09/24/2020   PROT 6.2 (L) 09/24/2020   ALBUMIN 2.8 (L) 09/24/2020   CALCIUM 9.4 10/01/2020   ANIONGAP 12 09/24/2020   GFR 32.30 (L) 01/14/2020   Lab Results  Component Value Date   CHOL 109 01/14/2020   Lab Results  Component Value Date   HDL 26.50 (L) 01/14/2020   Lab Results  Component Value Date   LDLCALC 56 01/14/2020   Lab Results  Component Value Date   TRIG 132.0 01/14/2020   Lab Results  Component Value Date   CHOLHDL 4 01/14/2020   Lab Results  Component Value Date   HGBA1C 7.3 (H) 09/07/2020      Assessment & Plan:   #1 hypertension stable and at goal -Continue current medications  #2 depressed mood.  Possible intolerance with low-dose Lexapro. -Consider trial of low-dose Prozac 10 mg once daily.  Reassess at follow-up in a few months  #3 type 2 diabetes stable with recent A1c 7.3%.  Home CBG stable.  Continue current regimen and recheck A1c at follow-up  #4 benign-appearing seborrheic keratosis right upper chest wall.  Reassurance given.  We explained this could be removed by either cryotherapy or surgical excision but recommend observation for  now  Meds ordered this encounter  Medications  . FLUoxetine (PROZAC) 10 MG capsule    Sig: Take 1 capsule (10 mg total) by mouth daily.    Dispense:  30 capsule    Refill:  3    Follow-up: Return in about 3 months (around 01/27/2021).    Carolann Littler, MD

## 2020-10-29 NOTE — Patient Instructions (Signed)

## 2020-11-04 ENCOUNTER — Telehealth: Payer: Medicare PPO | Admitting: Cardiology

## 2020-11-14 ENCOUNTER — Telehealth: Payer: Self-pay | Admitting: Family Medicine

## 2020-11-14 NOTE — Telephone Encounter (Signed)
Left message for patient to call back and schedule Medicare Annual Wellness Visit (AWV) either virtually or in office.   Last AWV 06/18/16  please schedule at anytime with [facility] Nurse Health Advisor 1 or 2   This should be a 45 minute visit.

## 2020-11-20 ENCOUNTER — Other Ambulatory Visit: Payer: Self-pay | Admitting: Family Medicine

## 2020-11-20 DIAGNOSIS — H1820 Unspecified corneal edema: Secondary | ICD-10-CM | POA: Diagnosis not present

## 2020-11-28 ENCOUNTER — Other Ambulatory Visit: Payer: Self-pay

## 2020-11-28 MED ORDER — NITROGLYCERIN 0.4 MG SL SUBL: 0.4000 mg | SUBLINGUAL_TABLET | SUBLINGUAL | 1 refills | Status: DC | PRN

## 2020-12-01 ENCOUNTER — Other Ambulatory Visit: Payer: Self-pay | Admitting: Family Medicine

## 2020-12-11 ENCOUNTER — Ambulatory Visit: Payer: Medicare PPO | Admitting: Cardiology

## 2020-12-18 ENCOUNTER — Ambulatory Visit: Payer: Medicare PPO | Admitting: Cardiology

## 2020-12-29 NOTE — Progress Notes (Signed)
Cardiology Office Note   Date:  12/30/2020   ID:  [name], DOB 1933/01/20, MRN [id number]  PCP:  [name], MD  Cardiologist:   [name], MD  Chief Complaint  Patient presents with  . Shortness of Breath      History of Present Illness: [name] is a 87 y.o. male who presents for followup of CAD.   He had chest pain and had a cardiac catheterization on 04/01/2015 which showed a 95% in-stent restenosis in SVG to OM, treated with 2.75 x 24 mm Synergy DES postdilated to 3.3 mm. He had patent LIMA to LAD, patent SVG to posterolateral, patent SVG to PDA.    Of note he did have atrial fibrillation with rate control on presentation but he converted spontaneously. He was not started on warfarin because he was taking aspirin and Plavix.   He was having SOB in Dec 2018.  On [place] he had a fixed inferior defect.  CPX testing suggested a mixed restrictive/obstructive pattern of PFTs.  He was not thought to have a cardiovascular limitation.  He has been in atrial fib.  I had him wear a Holter and he had 100% atrial fib on this monitor.  He had a cardioversion.  However, he was back in atrial fibrillation .  He had continued dyspnea.  I sent him to pulmonary and he was found to have moderate COPD.   He was readmitted from 09/20/2020 to 09/24/2020 after presenting with severe chest pain that woke him from sleep with associated shortness of breath as well as orthopnea/PND and fever. High-sensitivity troponin was again mildly elevated. He was started on IV Lasix but this ultimately had to be stopped due to worsening renal function. Initially, there was concern that his CHF exacerbation may be secondary to atrial fibrillation. Therefore, he was started on Amiodarone with plans for possible TEE/DCCV. It was then felt that his aortic stenosis was his biggest cardiovascular problem. Amiodarone was discontinued and Structural Heart Team was consulted and recommended work-up for  possible TAVR. Patient underwent right/left heart cath on 09/23/2020 which showed severe native vessel CAD with patent LIMA to LAD, SVG to left PLA, and SVG to right PDA grafts. He had severe in-stent restenosis of SVG to OM graft which was treated with noncompliant balloon angioplasty. Moderate AS was noted with mean gradient of 14 mmHg but a component of paradoxical low flow low gradient AS was suspected. Triple therapy with Aspirin, Plavix, and Eliquis was recommended for 1 month at which time Aspirin can be discontinued. Plavix recommended for 6 months. Lasix was reduced to $RemoveBe'40mg'DyPPnUaCz$  once daily and Spironolactone was continued as well as Coreg and Amlodipine.  Unfortunately he continued to have chest pain at the follow up visit in mid December.   Since I last saw him he has done relatively okay.  He has not yet started his garden.  Is not been doing any more.  And is trying to avoid doing things like bending over.  However, he plans to do tingling in his yard.  He has had some occasional chest pain but cannot distinguish GI discomfort.  He has not taken any nitroglycerin since we saw him.  He does not think any of his different that he had before his PCI.  He thinks his breathing might be slightly worse.  However, he is not having any resting shortness of breath, PND or orthopnea.  Has had no new palpitations, presyncope or syncope.  He  has had no weight gain or edema.  Past Medical History:  Diagnosis Date  . Aortic stenosis, moderate 09/20/2020  . Arthritis   . CAD (coronary artery disease)    a. Cath September 2015 LIMA to the LAD patent, SVG to PDA patent, SVG to posterior lateral patent, SVG to OM with a 90% in-stent restenosis at an anastomotic lesion. This was treated with angioplasty. b. cath 04/01/2015 95% ISR in SVG to OM treated with 2.75x24 Synergy DES postdilated to 3.45mm, all other grafts patent  . Cataract    surgery,B/L  . CHF (congestive heart failure) ([place])   . Chronic kidney disease     nephrolithiasis  . Diabetes mellitus    TYPE 2  . GERD (gastroesophageal reflux disease)   . Hernia   . Hypercholesterolemia   . Hypertension   . Incontinence    hx  over 1 year,leaks without awareness  . Other and unspecified diseases of appendix   . PAF (paroxysmal atrial fibrillation) ([place])    in the setting of ischemia on 03/31/2015  . Pancreatitis   . Prostate CA ([place]) 03/01/04   prostate bx=Adenocarcinoam,gleason 3+4=7,PSA=6.75  . Shortness of breath dyspnea   . Ulcer    hx gastric    Past Surgical History:  Procedure Laterality Date  . APPENDECTOMY    . BACK SURGERY    . CARDIAC CATHETERIZATION N/A 04/01/2015   Procedure: Left Heart Cath and Cors/Grafts Angiography;  Surgeon: Jettie Booze, MD;  Location: [facility];  Service: Cardiovascular;  Laterality: N/A;  . CARDIAC CATHETERIZATION N/A 04/01/2015   Procedure: Coronary Stent Intervention;  Surgeon: Jettie Booze, MD;  Location: [facility];  Service: Cardiovascular;  Laterality: N/A;  . CARDIOVERSION N/A 11/01/2019   Procedure: CARDIOVERSION;  Surgeon: Geralynn Rile, MD;  Location: [facility];  Service: Cardiovascular;  Laterality: N/A;  . CARPAL TUNNEL RELEASE  2004   both hands  . CORONARY ARTERY BYPASS GRAFT    . CORONARY STENT INTERVENTION N/A 09/23/2020   Procedure: CORONARY STENT INTERVENTION;  Surgeon: Sherren Mocha, MD;  Location: [facility];  Service: Cardiovascular;  Laterality: N/A;  . CYSTOSCOPY  08/23/12   incomplete emoptying bladder  . HERNIA REPAIR    . incision and drainage of right chest abscess    . LAPAROSCOPIC CHOLECYSTECTOMY  09/2009  . LEFT HEART CATHETERIZATION WITH CORONARY ANGIOGRAM N/A 07/19/2014   Procedure: LEFT HEART CATHETERIZATION WITH CORONARY ANGIOGRAM;  Surgeon: Leonie Man, MD;  Location: [facility];  Service: Cardiovascular;  Laterality: N/A;  . RECTAL SURGERY    . RIGHT/LEFT HEART CATH AND CORONARY/GRAFT ANGIOGRAPHY N/A 09/23/2020    Procedure: RIGHT/LEFT HEART CATH AND CORONARY/GRAFT ANGIOGRAPHY;  Surgeon: Sherren Mocha, MD;  Location: [facility];  Service: Cardiovascular;  Laterality: N/A;  . ROBOT ASSISTED LAPAROSCOPIC RADICAL PROSTATECTOMY  2005     Current Outpatient Medications  Medication Sig Dispense Refill  . acetaminophen (TYLENOL) 500 MG tablet Take 1,000 mg by mouth every 4 (four) hours as needed for fever or headache (pain).    Marland Kitchen albuterol (PROVENTIL) (2.5 MG/3ML) 0.083% nebulizer solution Take 3 mLs (2.5 mg total) by nebulization every 4 (four) hours as needed for wheezing or shortness of breath. 75 mL 3  . albuterol (VENTOLIN HFA) 108 (90 Base) MCG/ACT inhaler Inhale 2 puffs into the lungs every 6 (six) hours as needed. 18 g 5  . amLODipine (NORVASC) 5 MG tablet Take 1 tablet (5 mg total) by mouth daily.  90 tablet 3  . blood glucose meter kit and supplies KIT Dispense based on patient and insurance preference. Use up to four times daily as directed. (FOR ICD-9 250.00, 250.01). 1 each 0  . budesonide-formoterol (SYMBICORT) 160-4.5 MCG/ACT inhaler Inhale 2 puffs into the lungs in the morning and at bedtime. 1 each 12  . carvedilol (COREG) 6.25 MG tablet TAKE 1 TABLET (6.25 MG TOTAL) BY MOUTH 2 (TWO) TIMES DAILY WITH A MEAL. 180 tablet 3  . clopidogrel (PLAVIX) 75 MG tablet TAKE 1 TABLET (75 MG TOTAL) BY MOUTH DAILY. (Patient taking differently: Take 75 mg by mouth daily.) 90 tablet 3  . docusate sodium (STOOL SOFTENER) 100 MG capsule Take 1 capsule (100 mg total) by mouth 2 (two) times daily as needed for mild constipation. (Patient taking differently: Take 100 mg by mouth at bedtime.) 30 capsule 0  . ELIQUIS 2.5 MG TABS tablet TAKE 1 TABLET BY MOUTH TWICE A DAY (Patient taking differently: Take 2.5 mg by mouth 2 (two) times daily.) 180 tablet 1  . FLUoxetine (PROZAC) 10 MG capsule TAKE 1 CAPSULE BY MOUTH EVERY DAY 90 capsule 2  . furosemide (LASIX) 40 MG tablet Take 1 tablet (40 mg total) by mouth in the  morning and at bedtime. 180 tablet 3  . guaiFENesin-dextromethorphan (ROBITUSSIN DM) 100-10 MG/5ML syrup Take 5 mLs by mouth every 6 (six) hours as needed for cough. 118 mL 0  . hydrocortisone 2.5 % cream Apply 1 application topically daily as needed (facial breakouts).    Marland Kitchen JANUVIA 50 MG tablet TAKE 1 TABLET BY MOUTH EVERY DAY 90 tablet 1  . Multiple Vitamins-Minerals (ICAPS AREDS 2 PO) Take 1 capsule by mouth daily.    . nitroGLYCERIN (NITROSTAT) 0.4 MG SL tablet Place 1 tablet (0.4 mg total) under the tongue every 5 (five) minutes x 3 doses as needed for chest pain (if no relief after 3rd dose, proceed to the ED for an evaluation). 75 tablet 1  . pantoprazole (PROTONIX) 40 MG tablet TAKE 1 TABLET BY MOUTH EVERY DAY (Patient taking differently: Take 40 mg by mouth daily.) 90 tablet 1  . rosuvastatin (CRESTOR) 20 MG tablet TAKE 1 TABLET BY MOUTH EVERY DAY (Patient taking differently: Take 20 mg by mouth daily.) 90 tablet 0  . spironolactone (ALDACTONE) 25 MG tablet TAKE 1 TABLET BY MOUTH EVERY DAY (Patient taking differently: Take 25 mg by mouth daily.) 90 tablet 2   No current facility-administered medications for this visit.    Allergies:   Codeine and Morphine    ROS:  Please see the history of present illness.   Otherwise, review of systems are positive for none .   All other systems are reviewed and negative.    PHYSICAL EXAM: VS:  BP (!) 138/58   Pulse 78   Ht $R'5\' 9"'Cf$  (1.753 m)   Wt 178 lb (80.7 kg)   SpO2 98%   BMI 26.29 kg/m  , BMI Body mass index is 26.29 kg/m. GENERAL:  Well appearing NECK:  No jugular venous distention, waveform within normal limits, carotid upstroke brisk and symmetric, no bruits, no thyromegaly LUNGS:  Clear to auscultation bilaterally CHEST:  Well healed sternotomy scar. HEART:  PMI not displaced or sustained,S1 and S2 within normal limits, no S3, no clicks, no rubs, 2 out of 6 apical systolic murmur early mid peaking, no diastolic murmurs,  iiregular ABD:  Flat, positive bowel sounds normal in frequency in pitch, no bruits, no rebound, no guarding, no midline pulsatile  mass, no hepatomegaly, no splenomegaly EXT:  2 plus pulses throughout, no edema, no cyanosis no clubbing    EKG:  EKG is not ordered today. NA  Recent Labs: 04/02/2020: Pro B Natriuretic peptide (BNP) 359.0 09/20/2020: B Natriuretic Peptide 685.4 09/21/2020: TSH 2.809 09/24/2020: ALT 41; Magnesium 1.9 10/01/2020: BUN 32; Creat 1.95; Hemoglobin 13.0; Platelets 206; Potassium 4.3; Sodium 133    Lipid Panel    Component Value Date/Time   CHOL 109 01/14/2020 0854   TRIG 132.0 01/14/2020 0854   TRIG 306 (HH) 09/27/2006 1154   HDL 26.50 (L) 01/14/2020 0854   CHOLHDL 4 01/14/2020 0854   VLDL 26.4 01/14/2020 0854   LDLCALC 56 01/14/2020 0854   LDLDIRECT 143.3 11/23/2007 0000      Wt Readings from Last 3 Encounters:  12/30/20 178 lb (80.7 kg)  10/29/20 175 lb 9.6 oz (79.7 kg)  10/01/20 171 lb 12.8 oz (77.9 kg)     CARDIAC CATH    Intervention       Other studies Reviewed: Additional studies/ records that were reviewed today include:  I personally reviewed his cath films for this visit.   Review of the above records demonstrates: See elsewhere  ASSESSMENT AND PLAN:  CAD s/p CABG with Recent PCI He has disease as above and has had ongoing pain.  He does have small vessel disease.  At this point he will continue with medication management.  He can stop his aspirin.  When he comes back to be seen in the [facility] he can have his Plavix stopped as per suggestions on his cath late last year.  Moderate Aortic Stenosis He is going have a follow-up echo in May and follow-up in the [facility].  It is not clear that he has moderate gradient is a low flow gradient.  He does have multiple reasons to have dyspnea.  See below  Chronic Combined CHF He seems to be euvolemic at this point.  I am and have him come back for some blood  work to include a basic metabolic profile.  Persistent Atrial Fibrillation He tolerates anticoagulation.  Discontinue the aspirin as above.  Carotid Artery Disease He is to have follow up Doppler in June.    Hypertension The blood pressure is at target. No change in medications is indicated. We will continue with therapeutic lifestyle changes (TLC).  Hyperlipidemia LDL was 56.  Continue current therapy.   Type 2 Diabetes Mellitus I will check an A1C.    CKD Stage III-IV The creatinine was mildly elevated at 1.95 in mid December. I will follow-up as above.  Current medicines are reviewed at length with the patient today.  The patient does not have concerns regarding medicines.  The following changes have been made:   None  Labs/ tests ordered today include:   Orders Placed This Encounter  Procedures  . Comprehensive Metabolic Panel (CMET)  . Lipid panel  . HgB A1c  . VAS US CAROTID     Disposition:   FU with me in June.   Signed, [name], MD  12/30/2020 1:48 PM    [facility]

## 2020-12-30 ENCOUNTER — Ambulatory Visit: Payer: Medicare PPO | Admitting: Cardiology

## 2020-12-30 ENCOUNTER — Encounter: Payer: Self-pay | Admitting: Cardiology

## 2020-12-30 ENCOUNTER — Other Ambulatory Visit: Payer: Self-pay

## 2020-12-30 VITALS — BP 138/58 | HR 78 | Ht 69.0 in | Wt 178.0 lb

## 2020-12-30 DIAGNOSIS — I10 Essential (primary) hypertension: Secondary | ICD-10-CM | POA: Diagnosis not present

## 2020-12-30 DIAGNOSIS — N183 Chronic kidney disease, stage 3 unspecified: Secondary | ICD-10-CM | POA: Diagnosis not present

## 2020-12-30 DIAGNOSIS — I2510 Atherosclerotic heart disease of native coronary artery without angina pectoris: Secondary | ICD-10-CM | POA: Diagnosis not present

## 2020-12-30 DIAGNOSIS — E118 Type 2 diabetes mellitus with unspecified complications: Secondary | ICD-10-CM | POA: Diagnosis not present

## 2020-12-30 DIAGNOSIS — E785 Hyperlipidemia, unspecified: Secondary | ICD-10-CM

## 2020-12-30 DIAGNOSIS — I4819 Other persistent atrial fibrillation: Secondary | ICD-10-CM

## 2020-12-30 DIAGNOSIS — I6523 Occlusion and stenosis of bilateral carotid arteries: Secondary | ICD-10-CM | POA: Diagnosis not present

## 2020-12-30 DIAGNOSIS — Z79899 Other long term (current) drug therapy: Secondary | ICD-10-CM

## 2020-12-30 NOTE — Patient Instructions (Addendum)
Medication Instructions:  STOP- Aspirin  *If you need a refill on your cardiac medications before your next appointment, please call your pharmacy*   Lab Work: CMP, HgB A1C and Fasting Lipids  If you have labs (blood work) drawn today and your tests are completely normal, you will receive your results only by: Marland Kitchen MyChart Message (if you have MyChart) OR . A paper copy in the mail If you have any lab test that is abnormal or we need to change your treatment, we will call you to review the results.   Testing/Procedures: Your physician has requested that you have a carotid duplex in June. This test is an ultrasound of the carotid arteries in your neck. It looks at blood flow through these arteries that supply the brain with blood. Allow one hour for this exam. There are no restrictions or special instructions.   Follow-Up: At [facility], you and your health needs are our priority.  As part of our continuing mission to provide you with exceptional heart care, we have created designated Provider Care Teams.  These Care Teams include your primary Cardiologist (physician) and Advanced Practice Providers (APPs -  Physician Assistants and Nurse Practitioners) who all work together to provide you with the care you need, when you need it.  We recommend signing up for the patient portal called "MyChart".  Sign up information is provided on this After Visit Summary.  MyChart is used to connect with patients for Virtual Visits (Telemedicine).  Patients are able to view lab/test results, encounter notes, upcoming appointments, etc.  Non-urgent messages can be sent to your provider as well.   To learn more about what you can do with MyChart, go to [email or web address].    Your next appointment:   3 month(s)  The format for your next appointment:   In Person  Provider:   You may see [name], MD or one of the following Advanced Practice Providers on your designated Care Team:    Rosaria Ferries, PA-C  Jory Sims, DNP, ANP

## 2021-01-01 DIAGNOSIS — E118 Type 2 diabetes mellitus with unspecified complications: Secondary | ICD-10-CM | POA: Diagnosis not present

## 2021-01-01 DIAGNOSIS — I2510 Atherosclerotic heart disease of native coronary artery without angina pectoris: Secondary | ICD-10-CM | POA: Diagnosis not present

## 2021-01-01 DIAGNOSIS — E785 Hyperlipidemia, unspecified: Secondary | ICD-10-CM | POA: Diagnosis not present

## 2021-01-01 DIAGNOSIS — I10 Essential (primary) hypertension: Secondary | ICD-10-CM | POA: Diagnosis not present

## 2021-01-01 DIAGNOSIS — I4819 Other persistent atrial fibrillation: Secondary | ICD-10-CM | POA: Diagnosis not present

## 2021-01-01 DIAGNOSIS — N183 Chronic kidney disease, stage 3 unspecified: Secondary | ICD-10-CM | POA: Diagnosis not present

## 2021-01-01 DIAGNOSIS — Z79899 Other long term (current) drug therapy: Secondary | ICD-10-CM | POA: Diagnosis not present

## 2021-01-02 ENCOUNTER — Other Ambulatory Visit: Payer: Self-pay | Admitting: *Deleted

## 2021-01-02 DIAGNOSIS — I10 Essential (primary) hypertension: Secondary | ICD-10-CM

## 2021-01-02 DIAGNOSIS — N289 Disorder of kidney and ureter, unspecified: Secondary | ICD-10-CM

## 2021-01-02 LAB — COMPREHENSIVE METABOLIC PANEL
ALT: 22 IU/L (ref 0–44)
AST: 16 IU/L (ref 0–40)
Albumin/Globulin Ratio: 1.6 (ref 1.2–2.2)
Albumin: 4.7 g/dL — ABNORMAL HIGH (ref 3.6–4.6)
Alkaline Phosphatase: 103 IU/L (ref 44–121)
BUN/Creatinine Ratio: 14 (ref 10–24)
BUN: 28 mg/dL — ABNORMAL HIGH (ref 8–27)
Bilirubin Total: 1.1 mg/dL (ref 0.0–1.2)
CO2: 22 mmol/L (ref 20–29)
Calcium: 9.8 mg/dL (ref 8.6–10.2)
Chloride: 96 mmol/L (ref 96–106)
Creatinine, Ser: 2.01 mg/dL — ABNORMAL HIGH (ref 0.76–1.27)
Globulin, Total: 3 g/dL (ref 1.5–4.5)
Glucose: 146 mg/dL — ABNORMAL HIGH (ref 65–99)
Potassium: 4.5 mmol/L (ref 3.5–5.2)
Sodium: 136 mmol/L (ref 134–144)
Total Protein: 7.7 g/dL (ref 6.0–8.5)
eGFR: 32 mL/min/{1.73_m2} — ABNORMAL LOW (ref >59)

## 2021-01-02 LAB — LIPID PANEL
Chol/HDL Ratio: 2.9 ratio (ref 0.0–5.0)
Cholesterol, Total: 97 mg/dL — ABNORMAL LOW (ref 100–199)
HDL: 33 mg/dL — ABNORMAL LOW (ref >39)
LDL Chol Calc (NIH): 46 mg/dL (ref 0–99)
Triglycerides: 92 mg/dL (ref 0–149)
VLDL Cholesterol Cal: 18 mg/dL (ref 5–40)

## 2021-01-02 LAB — HEMOGLOBIN A1C
Est. average glucose Bld gHb Est-mCnc: 174 mg/dL
Hgb A1c MFr Bld: 7.7 % — ABNORMAL HIGH (ref 4.8–5.6)

## 2021-01-13 ENCOUNTER — Other Ambulatory Visit: Payer: Self-pay | Admitting: Family Medicine

## 2021-01-22 DIAGNOSIS — R9721 Rising PSA following treatment for malignant neoplasm of prostate: Secondary | ICD-10-CM | POA: Diagnosis not present

## 2021-01-22 LAB — PSA: PSA: 7.92

## 2021-01-28 ENCOUNTER — Ambulatory Visit: Payer: Medicare PPO

## 2021-01-29 DIAGNOSIS — R9721 Rising PSA following treatment for malignant neoplasm of prostate: Secondary | ICD-10-CM | POA: Diagnosis not present

## 2021-01-29 DIAGNOSIS — C61 Malignant neoplasm of prostate: Secondary | ICD-10-CM | POA: Diagnosis not present

## 2021-01-29 DIAGNOSIS — R350 Frequency of micturition: Secondary | ICD-10-CM | POA: Diagnosis not present

## 2021-02-05 ENCOUNTER — Other Ambulatory Visit: Payer: Self-pay | Admitting: Family Medicine

## 2021-02-11 ENCOUNTER — Ambulatory Visit: Payer: Medicare PPO

## 2021-02-19 ENCOUNTER — Other Ambulatory Visit: Payer: Self-pay | Admitting: Family Medicine

## 2021-03-02 ENCOUNTER — Ambulatory Visit (INDEPENDENT_AMBULATORY_CARE_PROVIDER_SITE_OTHER): Payer: Medicare PPO

## 2021-03-02 ENCOUNTER — Other Ambulatory Visit: Payer: Self-pay

## 2021-03-02 DIAGNOSIS — Z0000 Encounter for general adult medical examination without abnormal findings: Secondary | ICD-10-CM

## 2021-03-02 NOTE — Progress Notes (Signed)
Subjective:   [name] is a 87 y.o. male who presents for an Initial Medicare Annual Wellness Visit.  I connected with today [name]  by telephone and verified that I am speaking with the correct person using two identifiers. Location patient: home Location provider: work Persons participating in the virtual visit: patient, provider.   I discussed the limitations, risks, security and privacy concerns of performing an evaluation and management service by telephone and the availability of in person appointments. I also discussed with the patient that there may be a patient responsible charge related to this service. The patient expressed understanding and verbally consented to this telephonic visit.    Interactive audio and video telecommunications were attempted between this provider and patient, however failed, due to patient having technical difficulties OR patient did not have access to video capability.  We continued and completed visit with audio only.    Review of Systems     n/a     Objective:    There were no vitals filed for this visit. There is no height or weight on file to calculate BMI.  Advanced Directives 09/20/2020 09/08/2020 09/08/2020 09/08/2020 09/07/2020 09/07/2020 11/01/2019  Does Patient Have a Medical Advance Directive? _0  No No  Type of Advance Directive - - - - - - -  Does patient want to make changes to medical advance directive? - - - - - - -  Would patient like information on creating a medical advance directive? No - Patient declined No - Patient declined No - Patient declined No - Patient declined No - Patient declined - No - Patient declined    Current Medications (verified) Outpatient Encounter Medications as of 03/02/2021  Medication Sig  . acetaminophen (TYLENOL) 500 MG tablet Take 1,000 mg by mouth every 4 (four) hours as needed for fever or headache (pain).  Marland Kitchen albuterol (PROVENTIL) (2.5 MG/3ML) 0.083% nebulizer solution Take 3  mLs (2.5 mg total) by nebulization every 4 (four) hours as needed for wheezing or shortness of breath.  Marland Kitchen albuterol (VENTOLIN HFA) 108 (90 Base) MCG/ACT inhaler Inhale 2 puffs into the lungs every 6 (six) hours as needed.  Marland Kitchen amLODipine (NORVASC) 5 MG tablet Take 1 tablet (5 mg total) by mouth daily.  . blood glucose meter kit and supplies KIT Dispense based on patient and insurance preference. Use up to four times daily as directed. (FOR ICD-9 250.00, 250.01).  . budesonide-formoterol (SYMBICORT) 160-4.5 MCG/ACT inhaler Inhale 2 puffs into the lungs in the morning and at bedtime.  . carvedilol (COREG) 6.25 MG tablet TAKE 1 TABLET (6.25 MG TOTAL) BY MOUTH 2 (TWO) TIMES DAILY WITH A MEAL.  Marland Kitchen clopidogrel (PLAVIX) 75 MG tablet TAKE 1 TABLET (75 MG TOTAL) BY MOUTH DAILY.  Marland Kitchen docusate sodium (STOOL SOFTENER) 100 MG capsule Take 1 capsule (100 mg total) by mouth 2 (two) times daily as needed for mild constipation. (Patient taking differently: Take 100 mg by mouth at bedtime.)  . ELIQUIS 2.5 MG TABS tablet TAKE 1 TABLET BY MOUTH TWICE A DAY (Patient taking differently: Take 2.5 mg by mouth 2 (two) times daily.)  . FLUoxetine (PROZAC) 10 MG capsule TAKE 1 CAPSULE BY MOUTH EVERY DAY  . furosemide (LASIX) 40 MG tablet Take 1 tablet (40 mg total) by mouth in the morning and at bedtime.  Marland Kitchen guaiFENesin-dextromethorphan (ROBITUSSIN DM) 100-10 MG/5ML syrup Take 5 mLs by mouth every 6 (six) hours as needed for cough.  . hydrocortisone 2.5 % cream  Apply 1 application topically daily as needed (facial breakouts).  Marland Kitchen JANUVIA 50 MG tablet TAKE 1 TABLET BY MOUTH EVERY DAY  . Multiple Vitamins-Minerals (ICAPS AREDS 2 PO) Take 1 capsule by mouth daily.  . nitroGLYCERIN (NITROSTAT) 0.4 MG SL tablet Place 1 tablet (0.4 mg total) under the tongue every 5 (five) minutes x 3 doses as needed for chest pain (if no relief after 3rd dose, proceed to the ED for an evaluation).  . pantoprazole (PROTONIX) 40 MG tablet TAKE 1 TABLET BY  MOUTH EVERY DAY  . rosuvastatin (CRESTOR) 20 MG tablet TAKE 1 TABLET BY MOUTH EVERY DAY  . spironolactone (ALDACTONE) 25 MG tablet TAKE 1 TABLET BY MOUTH EVERY DAY (Patient taking differently: Take 25 mg by mouth daily.)   No facility-administered encounter medications on file as of 03/02/2021.    Allergies (verified) Codeine and Morphine   History: Past Medical History:  Diagnosis Date  . Aortic stenosis, moderate 09/20/2020  . Arthritis   . CAD (coronary artery disease)    a. Cath September 2015 LIMA to the LAD patent, SVG to PDA patent, SVG to posterior lateral patent, SVG to OM with a 90% in-stent restenosis at an anastomotic lesion. This was treated with angioplasty. b. cath 04/01/2015 95% ISR in SVG to OM treated with 2.75x24 Synergy DES postdilated to 3.70m, all other grafts patent  . Cataract    surgery,B/L  . CHF (congestive heart failure) ([facility]   . Chronic kidney disease    nephrolithiasis  . Diabetes mellitus    TYPE 2  . GERD (gastroesophageal reflux disease)   . Hernia   . Hypercholesterolemia   . Hypertension   . Incontinence    hx  over 1 year,leaks without awareness  . Other and unspecified diseases of appendix   . PAF (paroxysmal atrial fibrillation) ([facility]    in the setting of ischemia on 03/31/2015  . Pancreatitis   . Prostate CA ([facility] 03/01/04   prostate bx=Adenocarcinoam,gleason 3+4=7,PSA=6.75  . Shortness of breath dyspnea   . Ulcer    hx gastric   Past Surgical History:  Procedure Laterality Date  . APPENDECTOMY    . BACK SURGERY    . CARDIAC CATHETERIZATION N/A 04/01/2015   Procedure: Left Heart Cath and Cors/Grafts Angiography;  Surgeon: JJettie Booze MD;  [facility];  Service: Cardiovascular;  Laterality: N/A;  . CARDIAC CATHETERIZATION N/A 04/01/2015   Procedure: Coronary Stent Intervention;  Surgeon: JJettie Booze MD;  [facility];  Service: Cardiovascular;  Laterality: N/A;  . CARDIOVERSION N/A  11/01/2019   Procedure: CARDIOVERSION;  Surgeon: OGeralynn Rile MD;  [facility]  Service: Cardiovascular;  Laterality: N/A;  . CARPAL TUNNEL RELEASE  2004   both hands  . CORONARY ARTERY BYPASS GRAFT    . CORONARY STENT INTERVENTION N/A 09/23/2020   Procedure: CORONARY STENT INTERVENTION;  Surgeon: CSherren Mocha MD;  [facility];  Service: Cardiovascular;  Laterality: N/A;  . CYSTOSCOPY  08/23/12   incomplete emoptying bladder  . HERNIA REPAIR    . incision and drainage of right chest abscess    . LAPAROSCOPIC CHOLECYSTECTOMY  09/2009  . LEFT HEART CATHETERIZATION WITH CORONARY ANGIOGRAM N/A 07/19/2014   Procedure: LEFT HEART CATHETERIZATION WITH CORONARY ANGIOGRAM;  Surgeon: DLeonie Man MD;  [facility];  Service: Cardiovascular;  Laterality: N/A;  . RECTAL SURGERY    . RIGHT/LEFT HEART CATH AND CORONARY/GRAFT ANGIOGRAPHY N/A 09/23/2020   Procedure:  RIGHT/LEFT HEART CATH AND CORONARY/GRAFT ANGIOGRAPHY;  Surgeon: Sherren Mocha, MD;  Location: [facility];  Service: Cardiovascular;  Laterality: N/A;  . ROBOT ASSISTED LAPAROSCOPIC RADICAL PROSTATECTOMY  2005   Family History  Problem Relation Age of Onset  . Cancer Mother        stomach   Social History   Socioeconomic History  . Marital status: Married    Spouse name: Not on file  . Number of children: 2  . Years of education: Not on file  . Highest education level: Not on file  Occupational History    Employer: RETIRED    Comment: Retired  Tobacco Use  . Smoking status: Former Smoker    Packs/day: 0.50    Years: 5.00    Pack years: 2.50    Types: Cigarettes    Quit date: 07/20/1972    Years since quitting: 48.6  . Smokeless tobacco: Never Used  . Tobacco comment: quit s  Vaping Use  . Vaping Use: Never used  Substance and Sexual Activity  . Alcohol use: No  . Drug use: No    Comment: quit smoking 40 years ago  . Sexual activity: Never  Other Topics Concern   . Not on file  Social History Narrative   Retired   Married      Social Determinants of Radio broadcast assistant Strain: Not on [employer] Insecurity: Not on file  Transportation Needs: Not on file  Physical Activity: Not on file  Stress: Not on file  Social Connections: Not on file    Tobacco Counseling Counseling given: Not Answered Comment: quit s   Clinical Intake:                 Diabetic?yes Nutrition Risk Assessment:  Has the patient had any N/V/D within the last 2 months?  No  Does the patient have any non-healing wounds?  No  Has the patient had any unintentional weight loss or weight gain?  No   Diabetes:  Is the patient diabetic?  Yes  If diabetic, was a CBG obtained today?  No  Did the patient bring in their glucometer from home?  No  How often do you monitor your CBG's? daily.   Financial Strains and Diabetes Management:  Are you having any financial strains with the device, your supplies or your medication? No .  Does the patient want to be seen by Chronic Care Management for management of their diabetes?  No  Would the patient like to be referred to a Nutritionist or for Diabetic Management?  No   Diabetic Exams:  [facility] Diabetic Foot Exam: Overdue, Pt has been advised about the importance in completing this exam. Pt is scheduled for diabetic foot exam on next office visit .          Activities of Daily Living In your present state of health, do you have any difficulty performing the following activities: 09/20/2020 09/07/2020  Hearing? Tempie Donning  Vision? N Y  Difficulty concentrating or making decisions? N N  Walking or climbing stairs? N Y  Dressing or bathing? N N  Doing errands, shopping? Tempie Donning  Some recent data might be hidden    Patient Care Team: [name], MD as PCP - General (Family Medicine) [name], MD as PCP - Cardiology (Cardiology)  Indicate any recent Medical  Services you may have received from other than [facility] providers in the past year (date  may be approximate).     Assessment:   This is a routine wellness examination for [facility].  Hearing/Vision screen No exam data present  Dietary issues and exercise activities discussed:    Goals Addressed   None    Depression Screen PHQ 2/9 Scores 10/15/2019 09/13/2018 05/17/2017 01/12/2016 12/04/2014 12/03/2013 12/03/2013  PHQ - 2 Score 0 0 0 0 0 0 0  PHQ- 9 Score 0 9 - - - - -    Fall Risk Fall Risk  07/29/2020 10/15/2019 03/14/2019 05/17/2017 01/12/2016  Falls in the past year? 0 0 0 No Yes  Number falls in past yr: 0 - - - 1  Injury with Fall? 0 - - - -    FALL RISK PREVENTION PERTAINING TO THE HOME:  Any stairs in or around the home? Yes  If so, are there any without handrails? No  Home free of loose throw rugs in walkways, pet beds, electrical cords, etc? Yes  Adequate lighting in your home to reduce risk of falls? Yes   ASSISTIVE DEVICES UTILIZED TO PREVENT FALLS:  Life alert? No  Use of a cane, walker or w/c? No  Grab bars in the bathroom? No  Shower chair or bench in shower? Yes  Elevated toilet seat or a handicapped toilet? Yes     Cognitive Function: Normal cognitive status assessed by direct observation by this Nurse Health Advisor. No abnormalities found.   MMSE - Mini Mental State Exam 06/18/2016  Orientation to time 4  Orientation to Place 5  Registration 3  Attention/ Calculation 1  Recall 3  Language- name 2 objects 2  Language- repeat 1  Language- follow 3 step command 3  Language- read & follow direction 1  Write a sentence 1  Copy design 1  Total score 25        Immunizations Immunization History  Administered Date(s) Administered  . Fluad Quad(high Dose 65+) 07/29/2020  . Influenza Split 10/04/2011  . Influenza Whole 07/16/2009  . Influenza, High Dose Seasonal PF 06/18/2016, 08/19/2017, 09/13/2018, 07/19/2019  . Influenza,inj,Quad PF,6+ Mos 08/13/2014   . Influenza-Unspecified 08/13/2017  . PFIZER(Purple Top)SARS-COV-2 Vaccination 11/24/2019, 12/15/2019  . Pneumococcal Conjugate-13 05/31/2008, 06/03/2014  . Pneumococcal Polysaccharide-23 11/17/2006  . Td 11/23/2007, 10/18/2012  . Zoster 10/18/2013    TDAP status: Up to date  Flu Vaccine status: Up to date  Pneumococcal vaccine status: Up to date  Covid-19 vaccine status: Completed vaccines  Qualifies for Shingles Vaccine? Yes   Zostavax completed Yes   Shingrix Completed?: No.    Education has been provided regarding the importance of this vaccine. Patient has been advised to call insurance company to determine out of pocket expense if they have not yet received this vaccine. Advised may also receive vaccine at local pharmacy or Health Dept. Verbalized acceptance and understanding.  Screening Tests Health Maintenance  Topic Date Due  . COVID-19 Vaccine (3 - Pfizer risk 4-dose series) 01/12/2020  . FOOT EXAM  08/17/2021 (Originally 02/08/2018)  . INFLUENZA VACCINE  05/18/2021  . HEMOGLOBIN A1C  07/04/2021  . URINE MICROALBUMIN  07/29/2021  . OPHTHALMOLOGY EXAM  10/22/2021  . TETANUS/TDAP  10/18/2022  . PNA vac Low Risk Adult  Completed  . HPV VACCINES  Aged Out    Health Maintenance  Health Maintenance Due  Topic Date Due  . COVID-19 Vaccine (3 - Pfizer risk 4-dose series) 01/12/2020    Colorectal cancer screening: No longer required.   Lung Cancer Screening: (Low Dose CT Chest recommended if  Age 64-80 years, 30 pack-year currently smoking OR have quit w/in 15years.) does not qualify.   Lung Cancer Screening Referral: n/a  Additional Screening:  Hepatitis C Screening: does not qualify;  Vision Screening: Recommended annual ophthalmology exams for early detection of glaucoma and other disorders of the eye. Is the patient up to date with their annual eye exam?  Yes  Who is the provider or what is the name of the office in which the patient attends [facility]  If pt is not established with a provider, would they like to be referred to a provider to establish care? No .   Dental Screening: Recommended annual dental exams for proper oral hygiene  Community Resource Referral / Chronic Care Management: CRR required this visit?  No   CCM required this visit?  No      Plan:     I have personally reviewed and noted the following in the patient's chart:   . Medical and social history . Use of alcohol, tobacco or illicit drugs  . Current medications and supplements including opioid prescriptions. Patient is not currently taking opioid prescriptions. . Functional ability and status . Nutritional status . Physical activity . Advanced directives . List of other physicians . Hospitalizations, surgeries, and ER visits in previous 12 months . Vitals . Screenings to include cognitive, depression, and falls . Referrals and appointments  In addition, I have reviewed and discussed with patient certain preventive protocols, quality metrics, and best practice recommendations. A written personalized care plan for preventive services as well as general preventive health recommendations were provided to patient.     Randel Pigg, LPN   [id number]   Nurse Notes: none

## 2021-03-02 NOTE — Patient Instructions (Signed)
[name] , Thank you for taking time to come for your Medicare Wellness Visit. I appreciate your ongoing commitment to your health goals. Please review the following plan we discussed and let me know if I can assist you in the future.   Screening recommendations/referrals: Colonoscopy: no longer required  Recommended yearly ophthalmology/optometry visit for glaucoma screening and checkup Recommended yearly dental visit for hygiene and checkup  Vaccinations: Influenza vaccine: current due in fall 2022 Pneumococcal vaccine: completed series  Tdap vaccine: current due in 2024 Shingles vaccine: Will obtain at the [facility]   Advanced directives: will provide copies   Conditions/risks identified: none   Next appointment: none   Preventive Care 65 Years and Older, Male Preventive care refers to lifestyle choices and visits with your health care provider that can promote health and wellness. What does preventive care include?  A yearly physical exam. This is also called an annual well check.  Dental exams once or twice a year.  Routine eye exams. Ask your health care provider how often you should have your eyes checked.  Personal lifestyle choices, including:  Daily care of your teeth and gums.  Regular physical activity.  Eating a healthy diet.  Avoiding tobacco and drug use.  Limiting alcohol use.  Practicing safe sex.  Taking low doses of aspirin every day.  Taking vitamin and mineral supplements as recommended by your health care provider. What happens during an annual well check? The services and screenings done by your health care provider during your annual well check will depend on your age, overall health, lifestyle risk factors, and family history of disease. Counseling  Your health care provider may ask you questions about your:  Alcohol use.  Tobacco use.  Drug use.  Emotional well-being.  Home and relationship well-being.  Sexual activity.  Eating  habits.  History of falls.  Memory and ability to understand (cognition).  Work and work Statistician. Screening  You may have the following tests or measurements:  Height, weight, and BMI.  Blood pressure.  Lipid and cholesterol levels. These may be checked every 5 years, or more frequently if you are over 69 years old.  Skin check.  Lung cancer screening. You may have this screening every year starting at age 70 if you have a 30-pack-year history of smoking and currently smoke or have quit within the past 15 years.  Fecal occult blood test (FOBT) of the stool. You may have this test every year starting at age 45.  Flexible sigmoidoscopy or colonoscopy. You may have a sigmoidoscopy every 5 years or a colonoscopy every 10 years starting at age 29.  Prostate cancer screening. Recommendations will vary depending on your family history and other risks.  Hepatitis C blood test.  Hepatitis B blood test.  Sexually transmitted disease (STD) testing.  Diabetes screening. This is done by checking your blood sugar (glucose) after you have not eaten for a while (fasting). You may have this done every 1-3 years.  Abdominal aortic aneurysm (AAA) screening. You may need this if you are a current or former smoker.  Osteoporosis. You may be screened starting at age 67 if you are at high risk. Talk with your health care provider about your test results, treatment options, and if necessary, the need for more tests. Vaccines  Your health care provider may recommend certain vaccines, such as:  Influenza vaccine. This is recommended every year.  Tetanus, diphtheria, and acellular pertussis (Tdap, Td) vaccine. You may need a Td booster every  10 years.  Zoster vaccine. You may need this after age 21.  Pneumococcal 13-valent conjugate (PCV13) vaccine. One dose is recommended after age 65.  Pneumococcal polysaccharide (PPSV23) vaccine. One dose is recommended after age 47. Talk to your health  care provider about which screenings and vaccines you need and how often you need them. This information is not intended to replace advice given to you by your health care provider. Make sure you discuss any questions you have with your health care provider. Document Released: 10/31/2015 Document Revised: 06/23/2016 Document Reviewed: 08/05/2015 Elsevier Interactive Patient Education  2017 [place] Prevention in the Home Falls can cause injuries. They can happen to people of all ages. There are many things you can do to make your home safe and to help prevent falls. What can I do on the outside of my home?  Regularly fix the edges of walkways and driveways and fix any cracks.  Remove anything that might make you trip as you walk through a door, such as a raised step or threshold.  Trim any bushes or trees on the path to your home.  Use bright outdoor lighting.  Clear any walking paths of anything that might make someone trip, such as rocks or tools.  Regularly check to see if handrails are loose or broken. Make sure that both sides of any steps have handrails.  Any raised decks and porches should have guardrails on the edges.  Have any leaves, snow, or ice cleared regularly.  Use sand or salt on walking paths during winter.  Clean up any spills in your garage right away. This includes oil or grease spills. What can I do in the bathroom?  Use night lights.  Install grab bars by the toilet and in the tub and shower. Do not use towel bars as grab bars.  Use non-skid mats or decals in the tub or shower.  If you need to sit down in the shower, use a plastic, non-slip stool.  Keep the floor dry. Clean up any water that spills on the floor as soon as it happens.  Remove soap buildup in the tub or shower regularly.  Attach bath mats securely with double-sided non-slip rug tape.  Do not have throw rugs and other things on the floor that can make you trip. What can I do  in the bedroom?  Use night lights.  Make sure that you have a light by your bed that is easy to reach.  Do not use any sheets or blankets that are too big for your bed. They should not hang down onto the floor.  Have a firm chair that has side arms. You can use this for support while you get dressed.  Do not have throw rugs and other things on the floor that can make you trip. What can I do in the kitchen?  Clean up any spills right away.  Avoid walking on wet floors.  Keep items that you use a lot in easy-to-reach places.  If you need to reach something above you, use a strong step stool that has a grab bar.  Keep electrical cords out of the way.  Do not use floor polish or wax that makes floors slippery. If you must use wax, use non-skid floor wax.  Do not have throw rugs and other things on the floor that can make you trip. What can I do with my stairs?  Do not leave any items on the stairs.  Make sure  that there are handrails on both sides of the stairs and use them. Fix handrails that are broken or loose. Make sure that handrails are as long as the stairways.  Check any carpeting to make sure that it is firmly attached to the stairs. Fix any carpet that is loose or worn.  Avoid having throw rugs at the top or bottom of the stairs. If you do have throw rugs, attach them to the floor with carpet tape.  Make sure that you have a light switch at the top of the stairs and the bottom of the stairs. If you do not have them, ask someone to add them for you. What else can I do to help prevent falls?  Wear shoes that:  Do not have high heels.  Have rubber bottoms.  Are comfortable and fit you well.  Are closed at the toe. Do not wear sandals.  If you use a stepladder:  Make sure that it is fully opened. Do not climb a closed stepladder.  Make sure that both sides of the stepladder are locked into place.  Ask someone to hold it for you, if possible.  Clearly mark  and make sure that you can see:  Any grab bars or handrails.  First and last steps.  Where the edge of each step is.  Use tools that help you move around (mobility aids) if they are needed. These include:  Canes.  Walkers.  Scooters.  Crutches.  Turn on the lights when you go into a dark area. Replace any light bulbs as soon as they burn out.  Set up your furniture so you have a clear path. Avoid moving your furniture around.  If any of your floors are uneven, fix them.  If there are any pets around you, be aware of where they are.  Review your medicines with your doctor. Some medicines can make you feel dizzy. This can increase your chance of falling. Ask your doctor what other things that you can do to help prevent falls. This information is not intended to replace advice given to you by your health care provider. Make sure you discuss any questions you have with your health care provider. Document Released: 07/31/2009 Document Revised: 03/11/2016 Document Reviewed: 11/08/2014 Elsevier Interactive Patient Education  2017 [employer].

## 2021-03-09 ENCOUNTER — Other Ambulatory Visit: Payer: Self-pay | Admitting: Family Medicine

## 2021-03-11 NOTE — Progress Notes (Signed)
Structural Heart Clinic Consult Note  Chief Complaint  Patient presents with  . Follow-up    Severe aortic stenosis     History of Present Illness: 87 yo male with history of arthritis, COPD, CAD s/p 4V CABG in 2009, chronic kidney disease stage 3-4, diabetes mellitus, GERD, HTN, hyperlipidemia, carotid artery disease, chronic diastolic CHF, paroxysmal atrial fibrillation, prostate cancer and severe aortic stenosis who is here today for follow up in the structural heart clinic. He has been followed by Dr. Percival Spanish. He is known to have CAD and has undergone 4V CABG in 2009 (LIMA to LAD, SVG to PDA, SVG to posterolateral artery, SVG to OM). The vein graft to the OM branch was treated with balloon angioplasty in 2015 and stented in 2016. At that time noted to be in atrial fibrillation. He has been on Eliquis. He has had ongoing dyspnea and has been found to have moderate COPD. He was admitted to [facility] in October 2020 with acute on chronic diastolic CHF. Echo at that time showed EF 60-65%, severe LVH, mild AS with mean gradient of 16 mm Hg. He was seen in follow up by Dr. Percival Spanish in 09/2019 and continued to have dyspnea. A Holter monitor was placed which revealed persistent afib.He had a cardioversion in 10/2019 with restoration of sinus rhythm. It was unclear if he had any symptomatic benefit with restoration of sinus. The patient continued to complain of fatigue and dyspnea and he was referred to pulmonology where he was diagnosed with COPD/moderate persistent asthma and started on inhalers. He was admitted 11/21-11/22/21 for chest pain and dyspnea in the setting of a febrile illness. Blood cultures and covid were negative. He was felt to have acute on chronic diastolic CHF and AECOPD. He improved with IV lasix and steroids. Repeat echo November 2021 showed LVEF 50-55%, severe LVH, moderate to severe AS with a mean gradient of 22 mm Hg, peak gradient 35.8 mm hg, AVA 0.8cm2, DVI 0.25 SVI 29, mild dilation  of the ascending aorta at 40 mm. Plans were to refer to structural heart and stress testing in the outpatient setting.   He was in his usual state of health until 09/20/20 when he presented to [facility] ED with chest pain, dyspnea and palpitations. Work up revealed elevated BNP at 685.4, CXR with no acute abnormality, HS trop 67--> 103 felt to be demand ischemia, creat 1.79--> 1.94--> 2.13, covid neg, Ddimer 0.76, blood cultures NGTD. He was started on amiodarone which was later discontinued due to bradycardia. He was treated with IV lasix which was later discontinued due to worsening renal function. Cardiac cath 09/23/20 with severe stent restenosis in the vein graft to the OM treated with balloon angioplasty. Continue patency of the LIMA to LAD, SVG to posterolateral artery and SVG to PDA). Filling pressures were normal. He was felt to have low flow/low gradient aortic stenosis. I saw him as a new structural heart team consult while he was admitted. We had planned outpatient CT scans in workup for TAVR. He was seen in follow up by Dr. Percival Spanish in March 2022 and reported ongoing dyspnea. Echo today with mean gradient 20 mmHg, AVA below 0.7 cm2. The valve appears severely stenotic.   He tells me today that he ongoing dyspnea and fatigue. No exertional chest pain. He has resting sharp chest pains. No change in dyspnea following his PCI in December 2021. He lives with his wife in [place]. He had been active working in the yard but has stopped this  due to fatigue and dyspnea. He has full top dentures and most of his native teeth on the bottom. He has not seen a dentist regularly. He is retired. 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   Primary Care Physician: [name], MD Primary Cardiologist: Percival Spanish Referring Cardiologist: Percival Spanish  Past Medical History:  Diagnosis Date  . Aortic stenosis, moderate 09/20/2020  . Arthritis   . CAD (coronary artery disease)    a. Cath September 2015 LIMA to  the LAD patent, SVG to PDA patent, SVG to posterior lateral patent, SVG to OM with a 90% in-stent restenosis at an anastomotic lesion. This was treated with angioplasty. b. cath 04/01/2015 95% ISR in SVG to OM treated with 2.75x24 Synergy DES postdilated to 3.78m, all other grafts patent  . Cataract    surgery,B/L  . CHF (congestive heart failure) ([place]   . Chronic kidney disease    nephrolithiasis  . Diabetes mellitus    TYPE 2  . GERD (gastroesophageal reflux disease)   . Hernia   . Hypercholesterolemia   . Hypertension   . Incontinence    hx  over 1 year,leaks without awareness  . Other and unspecified diseases of appendix   . PAF (paroxysmal atrial fibrillation) ([place]    in the setting of ischemia on 03/31/2015  . Pancreatitis   . [place]   prostate bx=Adenocarcinoam,gleason 3+4=7,PSA=6.75  . Shortness of breath dyspnea   . Ulcer    hx gastric    Past Surgical History:  Procedure Laterality Date  . APPENDECTOMY    . BACK SURGERY    . CARDIAC CATHETERIZATION N/A 04/01/2015   Procedure: Left Heart Cath and Cors/Grafts Angiography;  Surgeon: JJettie Booze MD;  [facility];  Service: Cardiovascular;  Laterality: N/A;  . CARDIAC CATHETERIZATION N/A 04/01/2015   Procedure: Coronary Stent Intervention;  Surgeon: JJettie Booze MD;  [facility];  Service: Cardiovascular;  Laterality: N/A;  . CARDIOVERSION N/A 11/01/2019   Procedure: CARDIOVERSION;  Surgeon: OGeralynn Rile MD;  [place] [facility]  Service: Cardiovascular;  Laterality: N/A;  . CARPAL TUNNEL RELEASE  2004   both hands  . CORONARY ARTERY BYPASS GRAFT    . CORONARY STENT INTERVENTION N/A 09/23/2020   Procedure: CORONARY STENT INTERVENTION;  Surgeon: CSherren Mocha MD;  [facility];  Service: Cardiovascular;  Laterality: N/A;  . CYSTOSCOPY  08/23/12   incomplete emoptying bladder  . HERNIA REPAIR    . incision and drainage of  right chest abscess    . LAPAROSCOPIC CHOLECYSTECTOMY  09/2009  . LEFT HEART CATHETERIZATION WITH CORONARY ANGIOGRAM N/A 07/19/2014   Procedure: LEFT HEART CATHETERIZATION WITH CORONARY ANGIOGRAM;  Surgeon: DLeonie Man MD;  [facility];  Service: Cardiovascular;  Laterality: N/A;  . RECTAL SURGERY    . RIGHT/LEFT HEART CATH AND CORONARY/GRAFT ANGIOGRAPHY N/A 09/23/2020   Procedure: RIGHT/LEFT HEART CATH AND CORONARY/GRAFT ANGIOGRAPHY;  Surgeon: CSherren Mocha MD;  [facility];  Service: Cardiovascular;  Laterality: N/A;  . ROBOT ASSISTED LAPAROSCOPIC RADICAL PROSTATECTOMY  2005    Current Outpatient Medications  Medication Sig Dispense Refill  . acetaminophen (TYLENOL) 500 MG tablet Take 1,000 mg by mouth every 4 (four) hours as needed for fever or headache (pain).    .Marland Kitchenalbuterol (VENTOLIN HFA) 108 (90 Base) MCG/ACT inhaler Inhale 2 puffs into the lungs every 6 (six) hours as needed. 18 g 5  . amLODipine (NORVASC)  5 MG tablet Take 1 tablet (5 mg total) by mouth daily. 90 tablet 3  . blood glucose meter kit and supplies KIT Dispense based on patient and insurance preference. Use up to four times daily as directed. (FOR ICD-9 250.00, 250.01). 1 each 0  . budesonide-formoterol (SYMBICORT) 160-4.5 MCG/ACT inhaler Inhale 2 puffs into the lungs in the morning and at bedtime. 1 each 12  . carvedilol (COREG) 6.25 MG tablet TAKE 1 TABLET (6.25 MG TOTAL) BY MOUTH 2 (TWO) TIMES DAILY WITH A MEAL. 180 tablet 3  . clopidogrel (PLAVIX) 75 MG tablet TAKE 1 TABLET (75 MG TOTAL) BY MOUTH DAILY. 90 tablet 2  . docusate sodium (STOOL SOFTENER) 100 MG capsule Take 1 capsule (100 mg total) by mouth 2 (two) times daily as needed for mild constipation. 30 capsule 0  . ELIQUIS 2.5 MG TABS tablet TAKE 1 TABLET BY MOUTH TWICE A DAY 180 tablet 1  . FLUoxetine (PROZAC) 10 MG capsule TAKE 1 CAPSULE BY MOUTH EVERY DAY 90 capsule 2  . furosemide (LASIX) 40 MG tablet Take 1 tablet (40 mg total)  by mouth in the morning and at bedtime. 180 tablet 3  . guaiFENesin-dextromethorphan (ROBITUSSIN DM) 100-10 MG/5ML syrup Take 5 mLs by mouth every 6 (six) hours as needed for cough. 118 mL 0  . hydrocortisone 2.5 % cream Apply 1 application topically daily as needed (facial breakouts).    Marland Kitchen JANUVIA 50 MG tablet TAKE 1 TABLET BY MOUTH EVERY DAY 90 tablet 1  . Multiple Vitamins-Minerals (ICAPS AREDS 2 PO) Take 1 capsule by mouth daily.    . nitroGLYCERIN (NITROSTAT) 0.4 MG SL tablet Place 1 tablet (0.4 mg total) under the tongue every 5 (five) minutes x 3 doses as needed for chest pain (if no relief after 3rd dose, proceed to the ED for an evaluation). 75 tablet 1  . pantoprazole (PROTONIX) 40 MG tablet TAKE 1 TABLET BY MOUTH EVERY DAY 90 tablet 1  . rosuvastatin (CRESTOR) 20 MG tablet TAKE 1 TABLET BY MOUTH EVERY DAY 90 tablet 0  . spironolactone (ALDACTONE) 25 MG tablet TAKE 1 TABLET BY MOUTH EVERY DAY 90 tablet 2   No current facility-administered medications for this visit.    Allergies  Allergen Reactions  . Codeine Other (See Comments)    Makes him feel goofy  . Morphine Other (See Comments)    Nightmares and felt bad    Social History   Socioeconomic History  . Marital status: Married    Spouse name: Not on file  . Number of children: 2  . Years of education: Not on file  . Highest education level: Not on file  Occupational History    Employer: RETIRED    Comment: Retired  Tobacco Use  . Smoking status: Former Smoker    Packs/day: 0.50    Years: 5.00    Pack years: 2.50    Types: Cigarettes    Quit date: 07/20/1972    Years since quitting: 48.6  . Smokeless tobacco: Never Used  . Tobacco comment: quit s  Vaping Use  . Vaping Use: Never used  Substance and Sexual Activity  . Alcohol use: No  . Drug use: No    Comment: quit smoking 40 years ago  . Sexual activity: Never  Other Topics Concern  . Not on file  Social History Narrative   Retired   Married       Social Determinants of Radio broadcast assistant Strain: [place]   .  Difficulty of Paying Living Expenses: Not hard at all  Food Insecurity: No Food Insecurity  . Worried About Charity fundraiser in the Last Year: Never true  . Ran Out of Food in the Last Year: Never true  Transportation Needs: No Transportation Needs  . Lack of Transportation (Medical): No  . Lack of Transportation (Non-Medical): No  Physical Activity: Insufficiently Active  . Days of Exercise per Week: 3 days  . Minutes of Exercise per Session: 30 min  Stress: No Stress Concern Present  . Feeling of Stress : Not at all  Social Connections: Socially Integrated  . Frequency of Communication with Friends and Family: More than three times a week  . Frequency of Social Gatherings with Friends and Family: More than three times a week  . Attends Religious Services: More than 4 times per year  . Active Member of Clubs or Organizations: Yes  . Attends Archivist Meetings: 1 to 4 times per year  . Marital Status: Married  Human resources officer Violence: Not At Risk  . Fear of Current or Ex-Partner: No  . Emotionally Abused: No  . Physically Abused: No  . Sexually Abused: No    Family History  Problem Relation Age of Onset  . Cancer Mother        stomach  . Heart attack Father     Review of Systems:  As stated in the HPI and otherwise negative.   BP 134/70   Pulse 87   Ht 5' 9" (1.753 m)   Wt 170 lb 3.2 oz (77.2 kg)   SpO2 99%   BMI 25.13 kg/m   Physical Examination: General: Well developed, well nourished, NAD  HEENT: OP clear, mucus membranes moist  SKIN: warm, dry. No rashes. Neuro: No focal deficits  Musculoskeletal: Muscle strength 5/5 all ext  Psychiatric: Mood and affect normal  Neck: No JVD, no carotid bruits, no thyromegaly, no lymphadenopathy.  Lungs:Clear bilaterally, no wheezes, rhonci, crackles Cardiovascular: Regular rate and rhythm. Distant heart sounds. Soft systolic murmur.   Abdomen:Soft. Bowel sounds present. Non-tender.  Extremities: No lower extremity edema. Pulses are 2 + in the bilateral DP/PT.  EKG:  EKG is not ordered today. The ekg ordered today demonstrates   Echo November 2021:   1. Left ventricular ejection fraction, by estimation, is 50 to 55%. The  left ventricle has low normal function. The left ventricle demonstrates  regional wall motion abnormalities (see scoring diagram/findings for  description). There is severe left  ventricular hypertrophy. Left ventricular diastolic function could not be  evaluated. There is severe hypokinesis of the left ventricular, basal-mid  inferior wall.  2. Right ventricular systolic function is moderately reduced. The right  ventricular size is normal.  3. Right atrial size was mildly dilated.  4. The mitral valve is abnormal. Trivial mitral valve regurgitation.  5. The aortic valve is calcified. Aortic valve regurgitation is not  visualized. Moderate to severe aortic valve stenosis. Aortic valve area,  by VTI measures 0.87 cm. Aortic valve mean gradient measures 22.0 mmHg.  Aortic valve Vmax measures 2.99 m/s. DI  of 0.25.  6. Aortic dilatation noted. There is mild dilatation of the ascending  aorta, measuring 40 mm.  7. The inferior vena cava is dilated in size with <50% respiratory  variability, suggesting right atrial pressure of 15 mmHg.   Comparison(s): Changes from prior study are noted. 07/27/2019: LVEF 60-65%,  severe LVH, mild AS - peak and mean gradients of 15/26 mmHg.  FINDINGS  Left Ventricle: Left ventricular ejection fraction, by estimation, is 50  to 55%. The left ventricle has low normal function. The left ventricle  demonstrates regional wall motion abnormalities. Severe hypokinesis of the  left ventricular, basal-mid  inferior wall. The left ventricular internal cavity size was normal in  size. There is severe left ventricular hypertrophy. Abnormal (paradoxical)  septal  motion, consistent with left bundle branch block. Left ventricular  diastolic function could not be  evaluated due to atrial fibrillation. Left ventricular diastolic function  could not be evaluated.   Right Ventricle: The right ventricular size is normal. No increase in  right ventricular wall thickness. Right ventricular systolic function is  moderately reduced.   Left Atrium: Left atrial size was normal in size.   Right Atrium: Right atrial size was mildly dilated.   Pericardium: There is no evidence of pericardial effusion.   Mitral Valve: The mitral valve is abnormal. Mild to moderate mitral  annular calcification. Trivial mitral valve regurgitation.   Tricuspid Valve: The tricuspid valve is grossly normal. Tricuspid valve  regurgitation is not demonstrated.   Aortic Valve: The aortic valve is calcified. Aortic valve regurgitation is  not visualized. Moderate to severe aortic stenosis is present. Aortic  valve mean gradient measures 22.0 mmHg. Aortic valve peak gradient  measures 35.8 mmHg. Aortic valve area, by  VTI measures 0.87 cm.   Pulmonic Valve: The pulmonic valve was grossly normal. Pulmonic valve  regurgitation is trivial.   Aorta: Aortic dilatation noted. There is mild dilatation of the ascending  aorta, measuring 40 mm.   Venous: The inferior vena cava is dilated in size with less than 50%  respiratory variability, suggesting right atrial pressure of 15 mmHg.   IAS/Shunts: No atrial level shunt detected by color flow Doppler.     LEFT VENTRICLE  PLAX 2D  LVIDd:     3.80 cm  LVIDs:     3.15 cm  LV PW:     1.90 cm  LV IVS:    2.00 cm  LVOT diam:   2.10 cm  LV SV:     57  LV SV Index:  29  LVOT Area:   3.46 cm    LV Volumes (MOD)  LV vol d, MOD A2C: 115.0 ml  LV vol d, MOD A4C: 117.0 ml  LV vol s, MOD A2C: 47.4 ml  LV vol s, MOD A4C: 51.6 ml  LV SV MOD A2C:   67.6 ml  LV SV MOD A4C:   117.0 ml  LV SV MOD BP:    64.7 ml   RIGHT VENTRICLE      IVC  RV S prime:   5.12 cm/s IVC diam: 2.40 cm  TAPSE (M-mode): 0.7 cm   LEFT ATRIUM       Index    RIGHT ATRIUM      Index  LA diam:    5.00 cm 2.56 cm/m RA Area:   21.30 cm  LA Vol (A2C):  57.7 ml 29.52 ml/m RA Volume:  67.00 ml 34.28 ml/m  LA Vol (A4C):  62.3 ml 31.88 ml/m  LA Biplane Vol: 62.0 ml 31.72 ml/m  AORTIC VALVE  AV Area (Vmax):  0.86 cm  AV Area (Vmean):  0.80 cm  AV Area (VTI):   0.87 cm  AV Vmax:      299.00 cm/s  AV Vmean:     226.000 cm/s  AV VTI:      0.659 m  AV Peak Grad:  35.8 mmHg  AV Mean Grad:   22.0 mmHg  LVOT Vmax:     74.20 cm/s  LVOT Vmean:    52.300 cm/s  LVOT VTI:     0.166 m  LVOT/AV VTI ratio: 0.25    AORTA  Ao Root diam: 3.30 cm  Ao Asc diam: 4.00 cm   MITRAL VALVE  MV Area (PHT): 4.52 cm   SHUNTS  MV Decel Time: 168 msec   Systemic VTI: 0.17 m  MV E velocity: 117.00 cm/s Systemic Diam: 2.10 cm   Cardiac cath December 2021:   Balloon angioplasty was performed using a BALLOON SAPPHIRE [place] 3.5X12.  Post intervention, there is a 20% residual stenosis.   1.  Severe native three-vessel coronary artery disease 2.  Status post aortocoronary bypass surgery with continued patency of the LIMA to LAD, saphenous vein graft to left posterior lateral branch, and saphenous vein graft to right PDA 3.  Severe in-stent restenosis in the saphenous vein graft to obtuse marginal (2 layers of stent), treated successfully with noncompliant balloon angioplasty (3.5 mm noncompliant balloon to 22 atm) 4.  Moderate aortic stenosis with mean gradient 14 mmHg, calculated aortic valve area 1.37 cm, moderate amount of difficulty crossing the valve with a straight wire, suspect component of paradoxical low flow low gradient aortic stenosis 5.  Normal diastolic filling pressures in the right and left heart  Recommendations: Aspirin x30 days,  clopidogrel 75 mg at least 1 month favor 6 months if tolerated, resume apixaban tomorrow  Total contrast: 40 cc   Recommendations  Antiplatelet/Anticoag Recommend to resume Apixaban, at currently prescribed dose and frequency on 09/24/2020. Recommend concurrent antiplatelet therapy of Aspirin 81 mg for 1 month and Clopidogrel 42m daily for 6 months .   Indications  Crescendo angina ([place] [I20.0 (ICD-10-CM)]   Procedural Details  Technical Details INDICATION: Complicated 87year old gentleman with progressive aortic stenosis, possible severe low-flow low gradient paradoxical aortic stenosis (D3).  Also with known CAD status post CABG and progressive anginal symptoms despite optimal medical therapy.  Referred for right and left heart catheterization for further evaluation.  PROCEDURAL DETAILS: The right groin is prepped, draped, and anesthetized with 1% lidocaine. Using direct ultrasound guidance a 5 French sheath is placed in the right femoral artery and a 7 French sheath is placed in the right femoral vein. UKoreaimages are captured and stored in the patient's chart. A Swan-Ganz catheter is used for the right heart catheterization. Standard protocol is followed for recording of right heart pressures and sampling of oxygen saturations. Fick cardiac output is calculated. Standard Judkins catheters are used for selective coronary angiography and bypass graft angiography.  The aortic valve was crossed with an AL 2 catheter and a straight wire.  LV pressure is recorded and an aortic valve pullback gradient is recorded.  PCI is performed after the diagnostic procedure.  The patient has been maintained on clopidogrel and his last dose was this morning.  The 5 French sheath in the right femoral artery is upsized to a 6 FPakistansheath over a 0.035 inch wire.  Angiomax was used for anticoagulation.  PCI is performed of the saphenous vein graft to obtuse marginal without complication.  Noncompliant balloon  angioplasty is performed for treatment of severe in-stent restenosis.  The patient tolerated the procedure well.  The femoral artery and vein were both closed with Perclose devices without complication.  Angiomax was used for anticoagulation and it is discontinued at the completion of the procedure.  There are  no immediate procedural complications. The patient is transferred to the post catheterization recovery area for further monitoring.    Estimated blood loss <50 mL.   During this procedure medications were administered to achieve and maintain moderate conscious sedation while the patient's heart rate, blood pressure, and oxygen saturation were continuously monitored and I was present face-to-face 100% of this time.   Medications (Filter: Administrations occurring from 1354 to 1553 on 09/23/20) (important) Continuous medications are totaled by the amount administered until 09/23/20 1553.    midazolam (VERSED) injection (mg) Total dose:  1 mg  Date/Time Rate/Dose/Volume Action   09/23/20 1419 1 mg Given    fentaNYL (SUBLIMAZE) injection (mcg) Total dose:  25 mcg  Date/Time Rate/Dose/Volume Action   09/23/20 1419 25 mcg Given    lidocaine (PF) (XYLOCAINE) 1 % injection (mL) Total volume:  15 mL  Date/Time Rate/Dose/Volume Action   09/23/20 1425 15 mL Given    bivalirudin (ANGIOMAX) BOLUS via infusion (mg/kg) Total dose:  57.075 mg Dosing weight:  76.1  Date/Time Rate/Dose/Volume Action   09/23/20 1457 57.075 mg Given    bivalirudin (ANGIOMAX) 250 mg in sodium chloride 0.9 % 50 mL (5 mg/mL) infusion (mg/kg/hr) Total dose:  28.07 mg Dosing weight:  76.1  Date/Time Rate/Dose/Volume Action   09/23/20 1459 1 mg/kg/hr - 15.2 mL/hr New Bag/Given   1521  Stopped    0.9 % sodium chloride infusion (mL/hr) Total dose:  Cannot be calculated* Dosing weight:  76.1  *Continuous medication not stopped within the calculation time range. Date/Time Rate/Dose/Volume Action    09/23/20 1526 100 mL/hr New Bag/Given    iohexol (OMNIPAQUE) 350 MG/ML injection (mL) Total volume:  40 mL  Date/Time Rate/Dose/Volume Action   09/23/20 1549 40 mL Given    0.9 % sodium chloride infusion (mL) Total dose:  Cannot be calculated* Dosing weight:  81.6  *Administration dose not documented Date/Time Rate/Dose/Volume Action   09/23/20 1354 *Not included in total MAR Hold    acetaminophen (TYLENOL) tablet 1,000 mg (mg) Total dose:  Cannot be calculated* Dosing weight:  81.6  *Administration dose not documented Date/Time Rate/Dose/Volume Action   09/23/20 1354 *Not included in total MAR Hold    amLODipine (NORVASC) tablet 5 mg (mg) Total dose:  Cannot be calculated*  *Administration dose not documented Date/Time Rate/Dose/Volume Action   09/23/20 1354 *Not included in total MAR Hold    carvedilol (COREG) tablet 6.25 mg (mg) Total dose:  Cannot be calculated*  *Administration dose not documented Date/Time Rate/Dose/Volume Action   09/23/20 1354 *Not included in total MAR Hold    clopidogrel (PLAVIX) tablet 75 mg (mg) Total dose:  Cannot be calculated*  *Administration dose not documented Date/Time Rate/Dose/Volume Action   09/23/20 1354 *Not included in total MAR Hold    docusate sodium (COLACE) capsule 100 mg (mg) Total dose:  Cannot be calculated* Dosing weight:  81.6  *Administration dose not documented Date/Time Rate/Dose/Volume Action   09/23/20 1354 *Not included in total MAR Hold    fentaNYL (SUBLIMAZE) injection 50 mcg (mcg) Total dose:  Cannot be calculated* Dosing weight:  81.8  *Administration dose not documented Date/Time Rate/Dose/Volume Action   09/23/20 1354 *Not included in total MAR Hold    fluticasone furoate-vilanterol (BREO ELLIPTA) 200-25 MCG/INH 1 puff (puff) Total dose:  Cannot be calculated* Dosing weight:  81.6  *Administration dose not documented Date/Time Rate/Dose/Volume Action   09/23/20 1354 *Not included in total  MAR Hold    guaiFENesin-dextromethorphan (ROBITUSSIN DM) 100-10 MG/5ML syrup 5 mL (  mL) Total dose:  Cannot be calculated* Dosing weight:  81.6  *Administration dose not documented Date/Time Rate/Dose/Volume Action   09/23/20 1354 *Not included in total MAR Hold    hydrocortisone cream 1 % 1 application (application) Total dose:  Cannot be calculated* Dosing weight:  81.6  *Administration dose not documented Date/Time Rate/Dose/Volume Action   09/23/20 1354 *Not included in total MAR Hold    insulin aspart (novoLOG) injection 0-9 Units (Units) Total dose:  Cannot be calculated* Dosing weight:  81.6  *Administration dose not documented Date/Time Rate/Dose/Volume Action   09/23/20 1354 *Not included in total MAR Hold    levalbuterol (XOPENEX) nebulizer solution 1.25 mg (mg) Total dose:  Cannot be calculated* Dosing weight:  76.9  *Administration dose not documented Date/Time Rate/Dose/Volume Action   09/23/20 1354 *Not included in total MAR Hold    linagliptin (TRADJENTA) tablet 5 mg (mg) Total dose:  Cannot be calculated* Dosing weight:  81.6  *Administration dose not documented Date/Time Rate/Dose/Volume Action   09/23/20 1354 *Not included in total MAR Hold    melatonin tablet 10 mg (mg) Total dose:  Cannot be calculated* Dosing weight:  76.2  *Administration dose not documented Date/Time Rate/Dose/Volume Action   09/23/20 1354 *Not included in total MAR Hold    nitroGLYCERIN (NITROSTAT) SL tablet 0.4 mg (mg) Total dose:  Cannot be calculated* Dosing weight:  81.8  *Administration dose not documented Date/Time Rate/Dose/Volume Action   09/23/20 1354 *Not included in total MAR Hold    ondansetron (ZOFRAN) injection 4 mg (mg) Total dose:  Cannot be calculated* Dosing weight:  81.6  *Administration dose not documented Date/Time Rate/Dose/Volume Action   09/23/20 1354 *Not included in total MAR Hold    pantoprazole (PROTONIX) EC tablet 40 mg (mg) Total dose:   Cannot be calculated*  *Administration dose not documented Date/Time Rate/Dose/Volume Action   09/23/20 1354 *Not included in total MAR Hold    rosuvastatin (CRESTOR) tablet 20 mg (mg) Total dose:  Cannot be calculated*  *Administration dose not documented Date/Time Rate/Dose/Volume Action   09/23/20 1354 *Not included in total MAR Hold    sodium chloride flush (NS) 0.9 % injection 3 mL (mL) Total dose:  Cannot be calculated* Dosing weight:  81.6  *Administration dose not documented Date/Time Rate/Dose/Volume Action   09/23/20 1354 *Not included in total MAR Hold    sodium chloride flush (NS) 0.9 % injection 3 mL (mL) Total dose:  Cannot be calculated* Dosing weight:  81.6  *Administration dose not documented Date/Time Rate/Dose/Volume Action   09/23/20 1354 *Not included in total MAR Hold    sodium chloride flush (NS) 0.9 % injection 3 mL (mL) Total dose:  Cannot be calculated* Dosing weight:  76.1  *Administration dose not documented Date/Time Rate/Dose/Volume Action   09/23/20 1354 *Not included in total MAR Hold    Sedation Time  Sedation Time Physician-1: 1 hour 32 seconds   Contrast  Medication Name Total Dose  iohexol (OMNIPAQUE) 350 MG/ML injection 40 mL    Radiation/Fluoro  Fluoro time: 16.6 (min) DAP: 50161 (mGycm2) Cumulative Air Kerma: 724 (mGy)   Coronary Findings   Diagnostic Dominance: Co-dominant  Left Main  Ost LM to Mid LM lesion is 50% stenosed. The lesion is moderately calcified.  LM lesion is 25% stenosed.  Left Anterior Descending  Mid LAD lesion is 80% stenosed. The lesion is located at the bend.  Ramus Intermedius  Vessel is small.  Left Circumflex  Mid Cx to Dist Cx lesion is 80% stenosed.  Right Coronary Artery  Prox RCA lesion is 100% stenosed.  Right Posterior Descending Artery  RPDA lesion is 30% stenosed.  Single Graft Graft To RPDA  and is normal in caliber. The graft exhibits minimal luminal irregularities. This  graft is widely patent with no stenosis  Single Graft Graft To 1st RPL  and is normal in caliber. The graft exhibits minimal luminal irregularities.  Single Graft Graft To 2nd Mrg  and is normal in caliber.  Origin lesion is 40% stenosed. Does not appear flow obstructive, not significantly changed from the previous study  Dist Graft to Insertion lesion is 90% stenosed. The lesion is located at the bend. The lesion was previously treatedover 2 years ago. There is diffuse in-stent restenosis within the area of 2 layered DES, most severe at the graft insertion site at an area of angulation.  LIMA LIMA Graft To Dist LAD  LIMA. The LIMA to LAD graft is widely patent with no stenosis  Saphenous Graft To 1st LPL  SVG. Saphenous vein graft to left posterior lateral branch is widely patent with no stenosis   Intervention   Dist Graft to Insertion lesion (Single Graft Graft To 2nd Mrg)  Angioplasty  Lesion length: 18 mm. CATHETER LAUNCHER 6FR AL1 guide catheter was inserted. WIRE COUGAR XT STRL 190CM guidewire used to cross lesion. Balloon angioplasty was performed using a BALLOON SAPPHIRE [place] 3.5X12. Maximum pressure: 22 atm.  Post-Intervention Lesion Assessment  The intervention was successful. Pre-interventional TIMI flow is 3. Post-intervention TIMI flow is 3. No complications occurred at this lesion.  There is a 20% residual stenosis post intervention.   Right Heart  Right Heart Pressures LV EDP is normal.   Left Heart  Aortic Valve There is moderate aortic valve stenosis. The aortic valve is calcified. There is restricted aortic valve motion. Mean transaortic gradient 14 mmHg, calculated aortic valve area 1.37 cm   Coronary Diagrams   Diagnostic Dominance: Co-dominant    Intervention     Implants    No implant documentation for this case.    Syngo Images  Show images for CARDIAC CATHETERIZATION  Images on Long Term Storage  Show images for Tennis, NHAN QUALLEY to  Procedure Log  Procedure Log     Hemo Data  Flowsheet Row Most Recent Value  Fick Cardiac Output 3.75 L/min  Fick Cardiac Output Index 1.96 (L/min)/BSA  Aortic Mean Gradient 14.31 mmHg  Aortic Peak Gradient 14 mmHg  Aortic Valve Area 1.37  Aortic Value Area Index 0.71 cm2/BSA  RA A Wave 8 mmHg  RA V Wave 9 mmHg  RA Mean 7 mmHg  RV Systolic Pressure 43 mmHg  RV Diastolic Pressure 3 mmHg  RV EDP 6 mmHg  PA Systolic Pressure 44 mmHg  PA Diastolic Pressure 13 mmHg  PA Mean 24 mmHg  PW A Wave 13 mmHg  PW V Wave 17 mmHg  PW Mean 13 mmHg  AO Systolic Pressure 287 mmHg  AO Diastolic Pressure 43 mmHg  AO Mean 69 mmHg  LV Systolic Pressure 681 mmHg  LV Diastolic Pressure 5 mmHg  LV EDP 8 mmHg  AOp Systolic Pressure 157 mmHg  AOp Diastolic Pressure 55 mmHg  AOp Mean Pressure 76 mmHg  LVp Systolic Pressure 262 mmHg  LVp Diastolic Pressure 6 mmHg  LVp EDP Pressure 9 mmHg  QP/QS 1  TPVR Index 12.26 HRUI  TSVR Index 35.25 HRUI  PVR SVR Ratio 0.18  TPVR/TSVR Ratio 0.35     Recent Labs: 04/02/2020:  Pro B Natriuretic peptide (BNP) 359.0 09/20/2020: B Natriuretic Peptide 685.4 09/21/2020: TSH 2.809 09/24/2020: Magnesium 1.9 10/01/2020: Hemoglobin 13.0; Platelets 206 01/01/2021: ALT 22; BUN 28; Creatinine, Ser 2.01; Potassium 4.5; Sodium 136    Wt Readings from Last 3 Encounters:  03/12/21 170 lb 3.2 oz (77.2 kg)  12/30/20 178 lb (80.7 kg)  10/29/20 175 lb 9.6 oz (79.7 kg)     Other studies Reviewed: Additional studies/ records that were reviewed today include: echo images, office notes, cath images. Review of the above records demonstrates: low flow/low gradient severe AS  STS Score:   Procedure: Isolated AVR Risk of Mortality: 9.032% Renal Failure: 11.530% Permanent Stroke: 5.751% Prolonged Ventilation: 25.782% DSW Infection: 0.210% Reoperation: 6.791% Morbidity or Mortality: 40.454% Short Length of Stay: 10.691% Long Length of Stay: 23.497% Assessment  and Plan:   1. Severe Aortic Valve Stenosis: He likely has low flow/low gradient severe aortic stenosis. I have personally reviewed the echo images. The aortic valve is thickened, calcified with limited leaflet mobility. The valve visually appears to be severe. I think he would benefit from AVR. Given advanced age, he is not a good candidate for conventional AVR by surgical approach. I think he may be a good candidate for TAVR.   I have reviewed the natural history of aortic stenosis with the patient and their family members  who are present today. We have discussed the limitations of medical therapy and the poor prognosis associated with symptomatic aortic stenosis. We have reviewed potential treatment options, including palliative medical therapy, conventional surgical aortic valve replacement, and transcatheter aortic valve replacement. We discussed treatment options in the context of the patient's specific comorbid medical conditions.   He would like to proceed with planning for TAVR. Risks and benefits of the the valve procedure are reviewed with the patient. We will arrange a cardiac CT, CTA of the chest/abdomen and pelvis, carotid artery dopplers, PT assessment and he will then be referred to see one of the CT surgeons on our TAVR team. His scans may be delayed due to his CKD. BMET today. ? If he will need a formal nephrology consult prior to his scans. He is going to the beach from Sunday may 30th for one week and wishes to wait for his scans following is beach trip.      Current medicines are reviewed at length with the patient today.  The patient does not have concerns regarding medicines.  The following changes have been made:  no change  Labs/ tests ordered today include:   Orders Placed This Encounter  Procedures  . Basic metabolic panel     Disposition:   F/U with the valve team.    Signed, Lauree Chandler, MD 03/12/2021 10:34 AM    [facility]  [facility], [place] [place], [place]  [address] Phone: [phone]; Fax: [phone]

## 2021-03-12 ENCOUNTER — Encounter: Payer: Self-pay | Admitting: Cardiovascular Disease

## 2021-03-12 ENCOUNTER — Other Ambulatory Visit: Payer: Self-pay

## 2021-03-12 ENCOUNTER — Ambulatory Visit (HOSPITAL_COMMUNITY): Payer: Medicare PPO | Attending: Cardiology

## 2021-03-12 ENCOUNTER — Ambulatory Visit: Payer: Medicare PPO | Admitting: Cardiovascular Disease

## 2021-03-12 ENCOUNTER — Other Ambulatory Visit: Payer: Self-pay | Admitting: Family Medicine

## 2021-03-12 VITALS — BP 134/70 | HR 87 | Ht 69.0 in | Wt 170.2 lb

## 2021-03-12 DIAGNOSIS — I350 Nonrheumatic aortic (valve) stenosis: Secondary | ICD-10-CM | POA: Diagnosis not present

## 2021-03-12 DIAGNOSIS — N289 Disorder of kidney and ureter, unspecified: Secondary | ICD-10-CM | POA: Diagnosis not present

## 2021-03-12 LAB — ECHOCARDIOGRAM COMPLETE
AR max vel: 0.7 cm2
AV Area VTI: 0.63 cm2
AV Area mean vel: 0.57 cm2
AV Mean grad: 21 mmHg
AV Peak grad: 29.6 mmHg
Ao pk vel: 2.72 m/s
Area-P 1/2: 4.13 cm2
S' Lateral: 4 cm

## 2021-03-12 LAB — BASIC METABOLIC PANEL
BUN/Creatinine Ratio: 19 (ref 10–24)
BUN: 38 mg/dL — ABNORMAL HIGH (ref 8–27)
CO2: 23 mmol/L (ref 20–29)
Calcium: 10.3 mg/dL — ABNORMAL HIGH (ref 8.6–10.2)
Chloride: 93 mmol/L — ABNORMAL LOW (ref 96–106)
Creatinine, Ser: 2.03 mg/dL — ABNORMAL HIGH (ref 0.76–1.27)
Glucose: 209 mg/dL — ABNORMAL HIGH (ref 65–99)
Potassium: 5 mmol/L (ref 3.5–5.2)
Sodium: 133 mmol/L — ABNORMAL LOW (ref 134–144)
eGFR: 31 mL/min/{1.73_m2} — ABNORMAL LOW (ref >59)

## 2021-03-12 NOTE — Patient Instructions (Signed)
Medication Instructions:  No changes *If you need a refill on your cardiac medications before your next appointment, please call your pharmacy*   Lab Work: Today: BMET  If you have labs (blood work) drawn today and your tests are completely normal, you will receive your results only by: Marland Kitchen MyChart Message (if you have MyChart) OR . A paper copy in the mail If you have any lab test that is abnormal or we need to change your treatment, we will call you to review the results.   Testing/Procedures: None today   Follow-Up: Theodosia Quay, RN Structural Heart Nurse Navigator will contact you re: following up for CT scans.

## 2021-03-13 ENCOUNTER — Telehealth: Payer: Self-pay | Admitting: Cardiology

## 2021-03-13 MED ORDER — SPIRONOLACTONE 25 MG PO TABS: 1.0000 | ORAL_TABLET | Freq: Every day | ORAL | 3 refills | Status: DC

## 2021-03-13 NOTE — Telephone Encounter (Signed)
Medication refilled and sent to pharmacy.

## 2021-03-13 NOTE — Telephone Encounter (Signed)
*  STAT* If patient is at the pharmacy, call can be transferred to refill team.   1. Which medications need to be refilled? (please list name of each medication and dose if known) spironolactone (ALDACTONE) 25 MG tablet  2. Which pharmacy/location (including street and city if local pharmacy) is medication to be sent to? [id number] - [place], [place]  3. Do they need a 30 day or 90 day supply? 90 day supply

## 2021-03-19 ENCOUNTER — Other Ambulatory Visit: Payer: Self-pay

## 2021-03-19 DIAGNOSIS — N289 Disorder of kidney and ureter, unspecified: Secondary | ICD-10-CM

## 2021-03-19 DIAGNOSIS — I350 Nonrheumatic aortic (valve) stenosis: Secondary | ICD-10-CM

## 2021-03-26 ENCOUNTER — Ambulatory Visit (HOSPITAL_COMMUNITY)
Admission: RE | Admit: 2021-03-26 | Discharge: 2021-03-26 | Disposition: A | Discharge status: Home / Self Care | Payer: Medicare PPO | Source: Ambulatory Visit | Attending: Cardiovascular Disease | Admitting: Cardiovascular Disease

## 2021-03-26 ENCOUNTER — Telehealth: Payer: Self-pay | Admitting: Cardiovascular Disease

## 2021-03-26 ENCOUNTER — Ambulatory Visit (HOSPITAL_COMMUNITY): Payer: Medicare PPO

## 2021-03-26 DIAGNOSIS — I350 Nonrheumatic aortic (valve) stenosis: Secondary | ICD-10-CM | POA: Insufficient documentation

## 2021-03-26 DIAGNOSIS — N289 Disorder of kidney and ureter, unspecified: Secondary | ICD-10-CM | POA: Diagnosis not present

## 2021-03-26 DIAGNOSIS — E278 Other specified disorders of adrenal gland: Secondary | ICD-10-CM | POA: Diagnosis not present

## 2021-03-26 DIAGNOSIS — I700 Atherosclerosis of aorta: Secondary | ICD-10-CM | POA: Diagnosis not present

## 2021-03-26 DIAGNOSIS — R918 Other nonspecific abnormal finding of lung field: Secondary | ICD-10-CM | POA: Diagnosis not present

## 2021-03-26 MED ORDER — IOHEXOL 350 MG/ML SOLN
100.0000 mL | Freq: Once | INTRAVENOUS | Status: AC | PRN
  Administered 2021-03-26: 100 mL via INTRAVENOUS

## 2021-03-26 MED ORDER — SODIUM CHLORIDE 0.9 % WEIGHT BASED INFUSION
1.0000 mL/kg/h | INTRAVENOUS | Status: DC
Start: 2021-03-26 — End: 2021-03-27

## 2021-03-26 MED ORDER — SODIUM CHLORIDE 0.9 % WEIGHT BASED INFUSION
3.0000 mL/kg/h | INTRAVENOUS | Status: AC
Start: 2021-03-26 — End: 2021-03-26
  Administered 2021-03-26: 3 mL/kg/h via INTRAVENOUS

## 2021-03-26 NOTE — Progress Notes (Signed)
Pt here for iv fluid before/after CT. Reported eating one small egg and drinking water, finished at 0630. Called CT and informed [place], Zeb. Per her, okay to proceed with CT today. Also made her aware that bolus would be complete at 1010.

## 2021-03-26 NOTE — Telephone Encounter (Signed)
Spoke with Malachy Mood from Radiology in regards to patients CT angio chest today.    4. New mild patchy consolidation and ground-glass opacity in the dependent right lower lobe, suggesting aspiration or pneumonia.  Advised I would forward message to Dr. Angelena Form and his nurse.

## 2021-03-26 NOTE — Telephone Encounter (Signed)
Malachy Mood is calling to give a call report. She states there was an incidental finding. Pt has right lower lobe ground glass opacity suggesting aspiration for pneumonia. Advised the full report is on the way. Please advise.

## 2021-03-27 NOTE — Telephone Encounter (Signed)
Barkley Boards, RN  03/27/2021 11:26 AM EDT Back to Top     I contacted the pt and he denies fever, cough, chills and weakness.  The pt has SOB at baseline but he denies any new or worsening symptoms in regards to his breathing.  I advised the pt and his wife that they need to monitor for these symptoms and if they develop then they can contact me or the pt's PCP forfurther advisement. Pt and wife agreed with plan.

## 2021-03-30 ENCOUNTER — Other Ambulatory Visit (HOSPITAL_COMMUNITY): Payer: Self-pay | Admitting: Cardiology

## 2021-03-30 ENCOUNTER — Ambulatory Visit (HOSPITAL_COMMUNITY)
Admission: RE | Admit: 2021-03-30 | Discharge: 2021-03-30 | Disposition: A | Discharge status: Home / Self Care | Payer: Medicare PPO | Source: Ambulatory Visit | Attending: Cardiology | Admitting: Cardiology

## 2021-03-30 ENCOUNTER — Other Ambulatory Visit: Payer: Self-pay

## 2021-03-30 DIAGNOSIS — I6523 Occlusion and stenosis of bilateral carotid arteries: Secondary | ICD-10-CM

## 2021-04-01 ENCOUNTER — Encounter: Payer: Self-pay | Admitting: *Deleted

## 2021-04-06 ENCOUNTER — Ambulatory Visit: Payer: Medicare PPO | Admitting: Cardiology

## 2021-04-07 ENCOUNTER — Other Ambulatory Visit: Payer: Self-pay

## 2021-04-07 ENCOUNTER — Ambulatory Visit: Payer: Medicare PPO | Attending: Cardiovascular Disease | Admitting: Physical Therapy

## 2021-04-07 ENCOUNTER — Encounter: Payer: Self-pay | Admitting: Physical Therapy

## 2021-04-07 ENCOUNTER — Encounter: Payer: Medicare PPO | Admitting: Thoracic Surgery (Cardiothoracic Vascular Surgery)

## 2021-04-07 DIAGNOSIS — M6281 Muscle weakness (generalized): Secondary | ICD-10-CM | POA: Insufficient documentation

## 2021-04-07 NOTE — Therapy (Signed)
[place], [place], [address] Phone: [phone]   Fax:  [phone]  Physical Therapy Evaluation  Patient Details  Name: [name] MRN: [id number] Date of Birth: [birth year] Referring Provider (PT): Burnell Blanks, MD   Encounter Date: 04/07/2021   PT End of Session - 04/07/21 1506     Visit Number 1    Number of Visits 1    Date for PT Re-Evaluation 04/08/21    PT Start Time 18    PT Stop Time 1535    PT Time Calculation (min) 32 min    Activity Tolerance Patient tolerated treatment well    Behavior During Therapy [facility] for tasks assessed/performed             Past Medical History:  Diagnosis Date   Aortic stenosis, moderate 09/20/2020   Arthritis    CAD (coronary artery disease)    a. Cath September 2015 LIMA to the LAD patent, SVG to PDA patent, SVG to posterior lateral patent, SVG to OM with a 90% in-stent restenosis at an anastomotic lesion. This was treated with angioplasty. b. cath 04/01/2015 95% ISR in SVG to OM treated with 2.75x24 Synergy DES postdilated to 3.20mm, all other grafts patent   Cataract    surgery,B/L   CHF (congestive heart failure) (HCC)    Chronic kidney disease    nephrolithiasis   Diabetes mellitus    TYPE 2   GERD (gastroesophageal reflux disease)    Hernia    Hypercholesterolemia    Hypertension    Incontinence    hx  over 1 year,leaks without awareness   Other and unspecified diseases of appendix    PAF (paroxysmal atrial fibrillation) ([place])    in the setting of ischemia on 03/31/2015   Pancreatitis    Prostate CA ([place]) 03/01/04   prostate bx=Adenocarcinoam,gleason 3+4=7,PSA=6.75   Shortness of breath dyspnea    Ulcer    hx gastric    Past Surgical History:  Procedure Laterality Date   APPENDECTOMY     BACK SURGERY     CARDIAC CATHETERIZATION N/A 04/01/2015   Procedure: Left Heart Cath and Cors/Grafts Angiography;  Surgeon: Jettie Booze, MD;  Location: [place];  Service: Cardiovascular;  Laterality: N/A;   CARDIAC CATHETERIZATION N/A 04/01/2015   Procedure: Coronary Stent Intervention;  Surgeon: Jettie Booze, MD;  Location: [facility];  Service: Cardiovascular;  Laterality: N/A;   CARDIOVERSION N/A 11/01/2019   Procedure: CARDIOVERSION;  Surgeon: Geralynn Rile, MD;  Location: [facility] ENDOSCOPY;  Service: Cardiovascular;  Laterality: N/A;   CARPAL TUNNEL RELEASE  2004   both hands   CORONARY ARTERY BYPASS GRAFT     CORONARY STENT INTERVENTION N/A 09/23/2020   Procedure: CORONARY STENT INTERVENTION;  Surgeon: Sherren Mocha, MD;  Location: [facility];  Service: Cardiovascular;  Laterality: N/A;   CYSTOSCOPY  08/23/12   incomplete emoptying bladder   HERNIA REPAIR     incision and drainage of right chest abscess     LAPAROSCOPIC CHOLECYSTECTOMY  09/2009   LEFT HEART CATHETERIZATION WITH CORONARY ANGIOGRAM N/A 07/19/2014   Procedure: LEFT HEART CATHETERIZATION WITH CORONARY ANGIOGRAM;  Surgeon: Leonie Man, MD;  Location[facility] C[facility] LAB;  Service: Cardiovascular;  Laterality: N/A;   RECTAL SURGERY     RIGHT/LEFT HEART CATH AND CORONARY/GRAFT ANGIOGRAPHY N/A 09/23/2020   Procedure: RIGHT/LEFT HEART CATH AND CORONARY/GRAFT ANGIOGRAPHY;  Surgeon: Sherren Mocha, MD;  Location: [facility]  LAB;  Service: Cardiovascular;  Laterality: N/A;   ROBOT ASSISTED LAPAROSCOPIC RADICAL PROSTATECTOMY  2005    There were no vitals filed for this visit.    Subjective Assessment - 04/07/21 1507     Subjective pt is a 87 y.o m with CC of SOB, general fatigue and intermittent chest pain that occurs with actiivty. he reports that this has been going on for a couple months. He reports bil shoulder pain that he had recieved an injection about 1 month ago noting the L shoulder didn't improve.    Patient Stated Goals to fix heart    Currently in Pain? Yes    Pain Score 4     Pain Location Shoulder     Pain Orientation Left    Pain Descriptors / Indicators Aching;Sore    Pain Type Chronic pain    Pain Frequency Intermittent    Aggravating Factors  lifting the shoulder    Pain Relieving Factors resting, aheat    Effect of Pain on Daily Activities limited L shoulder activity .                [facility] PT Assessment - 04/07/21 0001       Assessment   Medical Diagnosis Severe aortic stenosis    Referring Provider (PT) Burnell Blanks, MD    Hand Dominance Right      Precautions   Precautions None      Restrictions   Weight Bearing Restrictions No      Balance Screen   Has the patient fallen in the past 6 months No      [place] residence    Living Arrangements Spouse/significant other    Available Help at Discharge Family    Type of [place] Access Level entry    [place] Two level    Alternate Level Stairs-Number of Steps 13   pt reports rarely going upstairs   Alternate Level Stairs-Rails Right   ascending   [place] - single point      ROM / Strength   AROM / PROM / Strength AROM;Strength      AROM   Overall AROM  Within functional limits for tasks performed      Strength   Overall Strength Comments gross bil UE stregnth 3+/5, LE strength WFL    Strength Assessment Site Hand    Right Hand Grip (lbs) 18    Left Hand Grip (lbs) 21      Ambulation/Gait   Ambulation/Gait Yes    Gait Pattern Decreased stride length;Step-through pattern;Antalgic              [facility] Pre-Surgical Assessment - 04/07/21 0001     5 Meter Walk Test- trial 1 5 sec    5 Meter Walk Test- trial 2 6 sec.     5 Meter Walk Test- trial 3 6 sec.    5 meter walk test average 5.67 sec    4 Stage Balance Test tolerated for:  10 sec.    4 Stage Balance Test Position 4    Sit To Stand Test- trial 1 16 sec.    ADL/IADL Independent with: Bathing;Dressing;Finances;Yard work    ADL/IADL Needs Assistance with: Meal prep     ADL/IADL Fraility Index Vulnerable    6 Minute Walk- Baseline yes    BP (mmHg) 148/86    HR (bpm) 86    02 Sat (%RA) 98 %  Modified Borg Scale for Dyspnea 9- Extremely severe    Perceived Rate of Exertion (Borg) 9- very light    6 Minute Walk Post Test yes    BP (mmHg) 140/75    HR (bpm) 99    02 Sat (%RA) 98 %    Modified Borg Scale for Dyspnea 9- Extremely severe    Perceived Rate of Exertion (Borg) 13- Somewhat hard    Aerobic Endurance Distance Walked 788    Endurance additional comments pt is 42.40% limited compared to age related norm                      Objective measurements completed on examination: See above findings.                            Plan - 04/07/21 1538     Clinical Impression Statement see assessment in note    Stability/Clinical Decision Making Stable/Uncomplicated    Clinical Decision Making Low    Rehab Potential Good    PT Frequency One time visit    PT Next Visit Plan Pre TAVR evaluation              Clinical Impression Statement: Pt is a 87 yo M presenting to OP PT for evaluation prior to possible TAVR surgery due to severe aortic stenosis. Pt reports onset of SOB, general fatigue, and intermittent chest pain with activity a couple months ago, pt had difficulty stated exactly how long. Symptoms are limiting walking/ standing endurance. Pt presents with good ROM and limited bil  strength, and bil LE strength WFL, good balance and is assessed as low at high fall risk 4 stage balance test, good walking speed and limited aerobic endurance per 6 minute walk test. Pt ambulated 788 feet in without requesting a seated rest beak lasting . At time of end of test, patient's HR was 99 bpm and O2 was 98% on room air. Pt reported 9/10 shortness of breath on modified scale for dyspnea.  Pt ambulated a total of 788 feet in 6 minute walk. SOB, general fatigue, bil LE soreness increased significantly with 6 minute walk test. Based  on the Short Physical Performance Battery, patient has a frailty rating of 10/12 with </= 5/12 considered frail.    Patient demonstrated the following deficits and impairments:     Visit Diagnosis: Muscle weakness (generalized) - Plan: PT plan of care cert/re-cert     Problem List Patient Active Problem List   Diagnosis Date Noted   Angina at rest [facility]) 09/20/2020   Chronic diastolic (congestive) heart failure ([place]) 09/20/2020   Aortic stenosis, moderate 09/20/2020   COPD with acute bronchitis ([place]) 09/08/2020   Acute exacerbation of CHF (congestive heart failure) ([place]) 09/07/2020   COPD mixed type ([place]) 95/18/8416   Chronic systolic HF (heart failure) ([place]) 60/63/0160   Systolic dysfunction with acute on chronic heart failure ([place]) 10/23/2019   Educated about COVID-19 virus infection 10/23/2019   Persistent atrial fibrillation ([place]) 08/29/2019   Nonrheumatic aortic valve stenosis 08/29/2019   Elevated troponin 07/27/2019   HLD (hyperlipidemia) 07/27/2019   GERD (gastroesophageal reflux disease) 07/27/2019   Congestive heart failure (CHF) ([place]) 07/26/2019   Pure hypercholesterolemia    Acute on chronic diastolic CHF (congestive heart failure) (HCC)    SOB (shortness of breath) 06/04/2019   CKD (chronic kidney disease), stage III ([place]) 06/04/2019   Coronary artery disease of native artery of native  heart with stable angina pectoris ([place]) 03/24/2018   Bilateral carotid artery stenosis 07/23/2017   Palpitation 07/23/2017   Urinary urgency 07/28/2016   PAF (paroxysmal atrial fibrillation) (HCC)    DOE (dyspnea on exertion) 07/17/2014   Acute on chronic renal insufficiency- ACE and diuretics held 43/32/9518   Diastolic dysfunction- moderate on echo 2013 07/17/2014   Aortic stenosis, mild by echo 2013 07/17/2014   LBBB (left bundle branch block) 07/17/2014   Chest pain 07/17/2014   Crescendo angina ([place]) 07/16/2014   CKD (chronic kidney disease) stage 4, GFR 15-29 ml/min (HCC)  12/03/2013   Squamous cell cancer of skin of hand 05/31/2013   HEAD TRAUMA, CLOSED 09/24/2010   Ischemic cardiomyopathy- last EF Normal Myoview Jan 2015 04/02/2010   INSOMNIA 04/02/2010   Moderate bilateral ICA disease by doppler June 2015 01/08/2010   DYSPHAGIA UNSPECIFIED 09/05/2009   Hx GERD/PUD 09/03/2009   CHOLELITHIASIS, WITH CHOLECYSTITIS 09/03/2009   ARTHRITIS 07/16/2009   HYPOKALEMIA 11/06/2008   CAD in native artery 11/06/2008   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 [place], [place], [address] Phone: [phone]   Fax:  [phone]  Name: [name] MRN: [id number] Date of Birth: July 21, 1933

## 2021-04-14 ENCOUNTER — Institutional Professional Consult (permissible substitution): Payer: Medicare PPO | Admitting: Thoracic Surgery (Cardiothoracic Vascular Surgery)

## 2021-04-14 ENCOUNTER — Encounter: Payer: Self-pay | Admitting: Thoracic Surgery (Cardiothoracic Vascular Surgery)

## 2021-04-14 ENCOUNTER — Other Ambulatory Visit: Payer: Self-pay

## 2021-04-14 VITALS — BP 122/61 | HR 64 | Resp 20 | Ht 69.0 in

## 2021-04-14 DIAGNOSIS — I350 Nonrheumatic aortic (valve) stenosis: Secondary | ICD-10-CM

## 2021-04-14 NOTE — Progress Notes (Signed)
[address]       [place] [address]             [phone]     CARDIOTHORACIC SURGERY CONSULTATION REPORT  Primary Cardiologist is [name], MD PCP is [name], MD  Chief Complaint  Patient presents with   Aortic Stenosis    Initial surgical consult, review TAVR workup    HPI:  Patient is an 87 year old male with coronary artery disease status post coronary artery bypass grafting in the remote past, multiple subsequent PCI and stent procedures, aortic stenosis, atrial fibrillation on long-term anticoagulation using Eliquis, chronic combined systolic and diastolic congestive heart failure, hypertension, type 2 diabetes mellitus, GE reflux disease, chronic kidney disease, and arthritis who has been referred for surgical consultation to discuss treatment options for management of aortic stenosis.  Patient's cardiac history dates back to 2008 when he underwent multivessel coronary artery bypass grafting by Dr. Prescott Gum.  He has been followed intermittently ever since by Dr. Percival Spanish.  He has undergone multiple previous stent procedures for vein graft disease most recently in December 2021 when he underwent PCI of saphenous vein graft to the obtuse marginal branch of the left circumflex coronary artery.  Catheterization at that time revealed that all of the other bypass grafts remained widely patent and free of significant disease.  Patient has had aortic stenosis that has gradually progressed in severity.  Recent follow-up echocardiogram performed Mar 12, 2021 revealed further progression of disease.  Left ventricular ejection fraction was estimated 45 to 50%.  The aortic valve is calcified with severe calcification, thickening, and restricted leaflet mobility involving all 3 leaflets.  Peak velocity across the aortic valve measured 2.7 m/s corresponding to mean transvalvular gradient estimated 21 mmHg but aortic valve area calculated only 0.63 cm with  DVI notably quite low at 0.20 and stroke-volume index only 21.  The patient was referred to the multidisciplinary heart valve clinic and has been evaluated previously by Dr. Angelena Form.  CT angiography was performed and the patient referred for surgery.  Patient is married and lives locally in [place] with his wife.  He has been retired for many years having previously bought and [place].  He does not exercise on a regular basis but he has remained functionally independent and reasonably active throughout retirement.  He describes a 2 to 3 year history of worsening exertional shortness of breath and chest pressure.  Gets short of breath with low-level activity and occasionally at rest.  He still gets chest discomfort as well when he is short of breath.  He states that the PCI procedure performed last December did not improve symptoms.  He has not had any dizzy spells or syncope.  He has decreased energy.  Appetite is normal.  Past Medical History:  Diagnosis Date   Aortic stenosis, moderate 09/20/2020   Arthritis    CAD (coronary artery disease)    a. Cath September 2015 LIMA to the LAD patent, SVG to PDA patent, SVG to posterior lateral patent, SVG to OM with a 90% in-stent restenosis at an anastomotic lesion. This was treated with angioplasty. b. cath 04/01/2015 95% ISR in SVG to OM treated with 2.75x24 Synergy DES postdilated to 3.55m, all other grafts patent   Cataract    surgery,B/L   CHF (congestive heart failure) ([facility]    Chronic kidney disease    nephrolithiasis   Diabetes mellitus    TYPE 2   GERD (  gastroesophageal reflux disease)    Hernia    Hypercholesterolemia    Hypertension    Incontinence    hx  over 1 year,leaks without awareness   Other and unspecified diseases of appendix    PAF (paroxysmal atrial fibrillation) ([place])    in the setting of ischemia on 03/31/2015   Pancreatitis    Prostate CA ([facility]) 03/01/04   prostate bx=Adenocarcinoam,gleason 3+4=7,PSA=6.75    Shortness of breath dyspnea    Ulcer    hx gastric    Past Surgical History:  Procedure Laterality Date   APPENDECTOMY     BACK SURGERY     CARDIAC CATHETERIZATION N/A 04/01/2015   Procedure: Left Heart Cath and Cors/Grafts Angiography;  Surgeon: Jettie Booze, MD;  Location: [facility];  Service: Cardiovascular;  Laterality: N/A;   CARDIAC CATHETERIZATION N/A 04/01/2015   Procedure: Coronary Stent Intervention;  Surgeon: Jettie Booze, MD;  Location: [facility];  Service: Cardiovascular;  Laterality: N/A;   CARDIOVERSION N/A 11/01/2019   Procedure: CARDIOVERSION;  Surgeon: Geralynn Rile, MD;  Location[facility] ENDOSCOPY;  Service: Cardiovascular;  Laterality: N/A;   CARPAL TUNNEL RELEASE  2004   both hands   CORONARY ARTERY BYPASS GRAFT     CORONARY STENT INTERVENTION N/A 09/23/2020   Procedure: CORONARY STENT INTERVENTION;  Surgeon: Sherren Mocha, MD;  Location: [facility];  Service: Cardiovascular;  Laterality: N/A;   CYSTOSCOPY  08/23/12   incomplete emoptying bladder   HERNIA REPAIR     incision and drainage of right chest abscess     LAPAROSCOPIC CHOLECYSTECTOMY  09/2009   LEFT HEART CATHETERIZATION WITH CORONARY ANGIOGRAM N/A 07/19/2014   Procedure: LEFT HEART CATHETERIZATION WITH CORONARY ANGIOGRAM;  Surgeon: Leonie Man, MD;  Location: [facility] CATH LAB;  Service: Cardiovascular;  Laterality: N/A;   RECTAL SURGERY     RIGHT/LEFT HEART CATH AND CORONARY/GRAFT ANGIOGRAPHY N/A 09/23/2020   Procedure: RIGHT/LEFT HEART CATH AND CORONARY/GRAFT ANGIOGRAPHY;  Surgeon: Sherren Mocha, MD;  Location: [facility];  Service: Cardiovascular;  Laterality: N/A;   ROBOT ASSISTED LAPAROSCOPIC RADICAL PROSTATECTOMY  2005    Family History  Problem Relation Age of Onset   Cancer Mother        stomach   Heart attack Father     Social History   Socioeconomic History   Marital status: Married    Spouse name: Not on file   Number of children: 2    Years of education: Not on file   Highest education level: Not on file  Occupational History    Employer: RETIRED    Comment: Retired  Tobacco Use   Smoking status: Former    Packs/day: 0.50    Years: 5.00    Pack years: 2.50    Types: Cigarettes    Quit date: 07/20/1972    Years since quitting: 48.7   Smokeless tobacco: Never   Tobacco comments:    quit s  Vaping Use   Vaping Use: Never used  Substance and Sexual Activity   Alcohol use: No   Drug use: No    Comment: quit smoking 40 years ago   Sexual activity: Never  Other Topics Concern   Not on file  Social History Narrative   Retired   Married      Social Determinants of Radio broadcast assistant Strain: Low Risk    Difficulty of Paying Living Expenses: Not hard at all  Food Insecurity: No [place]  Worried About Charity fundraiser in the Last Year: Never true   [place] in the Last Year: Never true  Transportation Needs: No Transportation Needs   Lack of Transportation (Medical): No   Lack of Transportation (Non-Medical): No  Physical Activity: Insufficiently Active   Days of Exercise per Week: 3 days   Minutes of Exercise per Session: 30 min  Stress: No Stress Concern Present   Feeling of Stress : Not at all  Social Connections: Socially Integrated   Frequency of Communication with Friends and Family: More than three times a week   Frequency of Social Gatherings with Friends and Family: More than three times a week   Attends Religious Services: More than 4 times per year   Active Member of [employer] or Organizations: Yes   Attends Archivist Meetings: 1 to 4 times per year   Marital Status: Married  Human resources officer Violence: Not At Risk   Fear of Current or Ex-Partner: No   Emotionally Abused: No   Physically Abused: No   Sexually Abused: No    Current Outpatient Medications  Medication Sig Dispense Refill   acetaminophen (TYLENOL) 500 MG tablet Take 1,000 mg by mouth every  4 (four) hours as needed for fever or headache (pain).     albuterol (VENTOLIN HFA) 108 (90 Base) MCG/ACT inhaler Inhale 2 puffs into the lungs every 6 (six) hours as needed. 18 g 5   amLODipine (NORVASC) 5 MG tablet Take 1 tablet (5 mg total) by mouth daily. 90 tablet 3   blood glucose meter kit and supplies KIT Dispense based on patient and insurance preference. Use up to four times daily as directed. (FOR ICD-9 250.00, 250.01). 1 each 0   budesonide-formoterol (SYMBICORT) 160-4.5 MCG/ACT inhaler Inhale 2 puffs into the lungs in the morning and at bedtime. 1 each 12   carvedilol (COREG) 6.25 MG tablet TAKE 1 TABLET (6.25 MG TOTAL) BY MOUTH 2 (TWO) TIMES DAILY WITH A MEAL. 180 tablet 3   clopidogrel (PLAVIX) 75 MG tablet TAKE 1 TABLET (75 MG TOTAL) BY MOUTH DAILY. 90 tablet 2   docusate sodium (STOOL SOFTENER) 100 MG capsule Take 1 capsule (100 mg total) by mouth 2 (two) times daily as needed for mild constipation. 30 capsule 0   ELIQUIS 2.5 MG TABS tablet TAKE 1 TABLET BY MOUTH TWICE A DAY 180 tablet 1   FLUoxetine (PROZAC) 10 MG capsule TAKE 1 CAPSULE BY MOUTH EVERY DAY 90 capsule 2   furosemide (LASIX) 40 MG tablet Take 1 tablet (40 mg total) by mouth in the morning and at bedtime. 180 tablet 3   guaiFENesin-dextromethorphan (ROBITUSSIN DM) 100-10 MG/5ML syrup Take 5 mLs by mouth every 6 (six) hours as needed for cough. 118 mL 0   hydrocortisone 2.5 % cream Apply 1 application topically daily as needed (facial breakouts).     JANUVIA 50 MG tablet TAKE 1 TABLET BY MOUTH EVERY DAY 90 tablet 1   Multiple Vitamins-Minerals (ICAPS AREDS 2 PO) Take 1 capsule by mouth daily.     nitroGLYCERIN (NITROSTAT) 0.4 MG SL tablet Place 1 tablet (0.4 mg total) under the tongue every 5 (five) minutes x 3 doses as needed for chest pain (if no relief after 3rd dose, proceed to the ED for an evaluation). 75 tablet 1   pantoprazole (PROTONIX) 40 MG tablet TAKE 1 TABLET BY MOUTH EVERY DAY 90 tablet 1   rosuvastatin  (CRESTOR) 20 MG tablet TAKE 1 TABLET BY MOUTH  EVERY DAY 90 tablet 0   spironolactone (ALDACTONE) 25 MG tablet Take 1 tablet (25 mg total) by mouth daily. 90 tablet 3   No current facility-administered medications for this visit.    Allergies  Allergen Reactions   Codeine Other (See Comments)    Makes him feel goofy   Morphine Other (See Comments)    Nightmares and felt bad      Review of Systems:   General:  normal appetite, decreased energy, no weight gain, no weight loss, no fever  Cardiac:  + chest pain with exertion, no chest pain at rest, +SOB with exertion, + occasional resting SOB, no PND, no orthopnea, no palpitations, + arrhythmia, + atrial fibrillation, no LE edema, no dizzy spells, no syncope  Respiratory:  + shortness of breath, no home oxygen, no productive cough, no dry cough, no bronchitis, no wheezing, no hemoptysis, no asthma, no pain with inspiration or cough, no sleep apnea, no CPAP at night  GI:   + some difficulty swallowing, no reflux, no frequent heartburn, no hiatal hernia, no abdominal pain, no constipation, no diarrhea, no hematochezia, no hematemesis, no melena  GU:   no dysuria,  + frequency, no urinary tract infection, no hematuria, no enlarged prostate, no kidney stones, + kidney disease  Vascular:  no pain suggestive of claudication, no pain in feet, no leg cramps, no varicose veins, no DVT, no non-healing foot ulcer  Neuro:   no stroke, no TIA's, no seizures, no headaches, no temporary blindness one eye,  no slurred speech, no peripheral neuropathy, no chronic pain, no instability of gait, no memory/cognitive dysfunction  Musculoskeletal: + arthritis, no joint swelling, no myalgias, no difficulty walking, normal mobility   Skin:   no rash, no itching, no skin infections, no pressure sores or ulcerations  Psych:   no anxiety, no depression, no nervousness, no unusual recent stress  Eyes:   no blurry vision, no floaters, no recent vision changes, + wears  glasses or contacts  ENT:   no hearing loss, no loose or painful teeth, + dentures, last saw dentist 1 week ago  Hematologic:  + easy bruising, no abnormal bleeding, no clotting disorder, no frequent epistaxis  Endocrine:  + diabetes, does check CBG's at home     Physical Exam:   BP 122/61 (BP Location: Right Arm, Patient Position: Sitting)   Pulse 64   Resp 20   Ht _0  (1.753 m)   SpO2 95% Comment: RA  BMI 24.66 kg/m   General:  Elderly,  well-appearing  HEENT:  Unremarkable   Neck:   no JVD, no bruits, no adenopathy   Chest:   clear to auscultation, symmetrical breath sounds, no wheezes, no rhonchi   CV:   RRR, grade II/VI systlic murmur   Abdomen:  soft, non-tender, no masses   Extremities:  warm, well-perfused, pulses palpable, no LE edema  Rectal/GU  Deferred  Neuro:   Grossly non-focal and symmetrical throughout  Skin:   Clean and dry, no rashes, no breakdown   Diagnostic Tests:  ECHOCARDIOGRAM REPORT         Patient Name:   [name]  Date of Exam: 03/12/2021  Medical Rec #:  [id number]     Height:       69.0 in  Accession #:    [id number]    Weight:       178.0 lb  Date of Birth:  [birth year]     BSA:  1.966 m  Patient Age:    75 years      BP:           126/77 mmHg  Patient Gender: M             HR:           62 bpm.  Exam Location:  [facility]   Procedure: 2D Echo and 3D Echo   Indications:    I35.0 Aortic Stenosis     History:        Patient has prior history of Echocardiogram examinations,  most                  recent 09/08/2020. CHF, CAD, Prior CABG, COPD,  Arrythmias:Atrial                  Fibrillation, Signs/Symptoms:Chest Pain and Shortness of  Breath;                  Risk Factors:Hypertension and Diabetes.     Sonographer:    Marygrace Drought RCS  Referring Phys: [place]     1. Left ventricular ejection fraction, by estimation, is 45 to 50%. The  left ventricle has mildly decreased function.  The left ventricle  demonstrates global hypokinesis. There is moderate concentric left  ventricular hypertrophy. Left ventricular  diastolic function could not be evaluated. Elevated left ventricular  end-diastolic pressure.   2. Right ventricular systolic function is normal. The right ventricular  size is normal. There is normal pulmonary artery systolic pressure.   3. Right atrial size was severely dilated.   4. The mitral valve is normal in structure. Mild mitral valve  regurgitation. No evidence of mitral stenosis.   5. The aortic valve is calcified. There is severe calcifcation of the  aortic valve. There is severe thickening of the aortic valve. Aortic valve  regurgitation is not visualized. Severe aortic valve stenosis. Aortic  valve area, by VTI measures 0.63 cm.  Aortic valve mean gradient measures 21.0 mmHg. Aortic valve Vmax measures  2.72 m/s.   6. Aortic dilatation noted. There is mild dilatation of the ascending  aorta, measuring 40 mm.   7. The inferior vena cava is normal in size with greater than 50%  respiratory variability, suggesting right atrial pressure of 3 mmHg.   8. Compared to echo 08/2020, the dimensionless index has decreased  further from 0.25 to 0.20. The mean AVG is unchanged. This is consistent  with low flow low gradient HFrEF severe AS.   FINDINGS   Left Ventricle: Left ventricular ejection fraction, by estimation, is 45  to 50%. The left ventricle has mildly decreased function. The left  ventricle demonstrates global hypokinesis. 3D left ventricular ejection  fraction analysis performed but not  reported based on interpreter judgement due to suboptimal quality. The  left ventricular internal cavity size was normal in size. There is  moderate concentric left ventricular hypertrophy. Left ventricular  diastolic function could not be evaluated due to  atrial fibrillation. Left ventricular diastolic function could not be  evaluated. Elevated left  ventricular end-diastolic pressure.   Right Ventricle: The right ventricular size is normal. No increase in  right ventricular wall thickness. Right ventricular systolic function is  normal. There is normal pulmonary artery systolic pressure. The tricuspid  regurgitant velocity is 1.84 m/s, and   with an assumed right atrial pressure of 3 mmHg, the estimated right  ventricular systolic pressure is 81.4 mmHg.  Left Atrium: Left atrial size was normal in size.   Right Atrium: Right atrial size was severely dilated.   Pericardium: There is no evidence of pericardial effusion.   Mitral Valve: The mitral valve is normal in structure. Mild mitral annular  calcification. Mild mitral valve regurgitation. No evidence of mitral  valve stenosis.   Tricuspid Valve: The tricuspid valve is normal in structure. Tricuspid  valve regurgitation is trivial. No evidence of tricuspid stenosis.   Aortic Valve: The aortic valve is calcified. There is severe calcifcation  of the aortic valve. There is severe thickening of the aortic valve.  Aortic valve regurgitation is not visualized. Severe aortic stenosis is  present. Aortic valve mean gradient  measures 21.0 mmHg. Aortic valve peak gradient measures 29.6 mmHg. Aortic  valve area, by VTI measures 0.63 cm.   Pulmonic Valve: The pulmonic valve was normal in structure. Pulmonic valve  regurgitation is trivial. No evidence of pulmonic stenosis.   Aorta: Aortic dilatation noted. There is mild dilatation of the ascending  aorta, measuring 40 mm.   Venous: The inferior vena cava is normal in size with greater than 50%  respiratory variability, suggesting right atrial pressure of 3 mmHg.   IAS/Shunts: No atrial level shunt detected by color flow Doppler.      LEFT VENTRICLE  PLAX 2D  LVIDd:         4.80 cm  Diastology  LVIDs:         4.00 cm  LV e' medial:    3.32 cm/s  LV PW:         1.40 cm  LV E/e' medial:  33.6  LV IVS:        1.50 cm  LV e'  lateral:   6.00 cm/s  LVOT diam:     2.00 cm  LV E/e' lateral: 18.6  LV SV:         41  LV SV Index:   21  LVOT Area:     3.14 cm                             3D Volume EF:                          3D EF:        52 %   RIGHT VENTRICLE  RV Basal diam:  3.90 cm  RV S prime:     5.92 cm/s  TAPSE (M-mode): 0.9 cm  RVSP:           16.5 mmHg   LEFT ATRIUM             Index       RIGHT ATRIUM           Index  LA diam:        4.20 cm 2.14 cm/m  RA Pressure: 3.00 mmHg  LA Vol (A2C):   49.1 ml 24.97 ml/m RA Area:     25.40 cm  LA Vol (A4C):   60.3 ml 30.67 ml/m RA Volume:   84.50 ml  42.98 ml/m  LA Biplane Vol: 56.0 ml 28.48 ml/m   AORTIC VALVE  AV Area (Vmax):    0.70 cm  AV Area (Vmean):   0.57 cm  AV Area (VTI):     0.63 cm  AV Vmax:           272.00 cm/s  AV  Vmean:          219.000 cm/s  AV VTI:            0.647 m  AV Peak Grad:      29.6 mmHg  AV Mean Grad:      21.0 mmHg  LVOT Vmax:         60.38 cm/s  LVOT Vmean:        39.975 cm/s  LVOT VTI:          0.130 m  LVOT/AV VTI ratio: 0.20     AORTA  Ao Root diam: 3.50 cm  Ao Asc diam:  4.00 cm   MITRAL VALVE                TRICUSPID VALVE  MV Area (PHT):              TR Peak grad:   13.5 mmHg  MV Decel Time:              TR Vmax:        184.00 cm/s  MV E velocity: 111.50 cm/s  Estimated RAP:  3.00 mmHg                              RVSP:           16.5 mmHg                                 SHUNTS                              Systemic VTI:  0.13 m                              Systemic Diam: 2.00 cm   Fransico Him MD  Electronically signed by Fransico Him MD  Signature Date/Time: 03/12/2021/11:28:43 AM      Sherren Mocha, MD (Primary)      Procedures  CORONARY STENT INTERVENTION  RIGHT/LEFT HEART CATH AND CORONARY/GRAFT ANGIOGRAPHY    Conclusion    Balloon angioplasty was performed using a BALLOON SAPPHIRE [place] 3.5X12. Post intervention, there is a 20% residual stenosis.   1.  Severe native three-vessel  coronary artery disease 2.  Status post aortocoronary bypass surgery with continued patency of the LIMA to LAD, saphenous vein graft to left posterior lateral branch, and saphenous vein graft to right PDA 3.  Severe in-stent restenosis in the saphenous vein graft to obtuse marginal (2 layers of stent), treated successfully with noncompliant balloon angioplasty (3.5 mm noncompliant balloon to 22 atm) 4.  Moderate aortic stenosis with mean gradient 14 mmHg, calculated aortic valve area 1.37 cm, moderate amount of difficulty crossing the valve with a straight wire, suspect component of paradoxical low flow low gradient aortic stenosis 5.  Normal diastolic filling pressures in the right and left heart   Recommendations: Aspirin x30 days, clopidogrel 75 mg at least 1 month favor 6 months if tolerated, resume apixaban tomorrow   Total contrast: 40 cc    Recommendations  Antiplatelet/Anticoag Recommend to resume Apixaban, at currently prescribed dose and frequency on 09/24/2020. Recommend concurrent antiplatelet therapy of Aspirin 81 mg for 1 month and Clopidogrel 25m daily for 6 months .     Indications  Crescendo angina ([facility] [  I20.0 (ICD-10-CM)]    Procedural Details  Technical Details INDICATION: Complicated 87 year old gentleman with progressive aortic stenosis, possible severe low-flow low gradient paradoxical aortic stenosis (D3).  Also with known CAD status post CABG and progressive anginal symptoms despite optimal medical therapy.  Referred for right and left heart catheterization for further evaluation.  PROCEDURAL DETAILS: The right groin is prepped, draped, and anesthetized with 1% lidocaine. Using direct ultrasound guidance a 5 French sheath is placed in the right femoral artery and a 7 French sheath is placed in the right femoral vein. [place] images are captured and stored in the patient's chart. A Swan-Ganz catheter is used for the right heart catheterization. Standard protocol is  followed for recording of right heart pressures and sampling of oxygen saturations. Fick cardiac output is calculated. Standard Judkins catheters are used for selective coronary angiography and bypass graft angiography.  The aortic valve was crossed with an AL 2 catheter and a straight wire.  LV pressure is recorded and an aortic valve pullback gradient is recorded.  PCI is performed after the diagnostic procedure.  The patient has been maintained on clopidogrel and his last dose was this morning.  The 5 French sheath in the right femoral artery is upsized to a 6 [place] sheath over a 0.035 inch wire.  Angiomax was used for anticoagulation.  PCI is performed of the saphenous vein graft to obtuse marginal without complication.  Noncompliant balloon angioplasty is performed for treatment of severe in-stent restenosis.  The patient tolerated the procedure well.  The femoral artery and vein were both closed with Perclose devices without complication.  Angiomax was used for anticoagulation and it is discontinued at the completion of the procedure.  There are no immediate procedural complications. The patient is transferred to the post catheterization recovery area for further monitoring.    Estimated blood loss <50 mL.   During this procedure medications were administered to achieve and maintain moderate conscious sedation while the patient's heart rate, blood pressure, and oxygen saturation were continuously monitored and I was present face-to-face 100% of this time.    Medications (Filter: Administrations occurring from 1354 to 1553 on 09/23/20)  important  Continuous medications are totaled by the amount administered until 09/23/20 1553.    midazolam (VERSED) injection (mg) Total dose:  1 mg  Date/Time Rate/Dose/Volume Action   09/23/20 1419 1 mg Given    fentaNYL (SUBLIMAZE) injection (mcg) Total dose:  25 mcg  Date/Time Rate/Dose/Volume Action   09/23/20 1419 25 mcg Given    lidocaine (PF)  (XYLOCAINE) 1 % injection (mL) Total volume:  15 mL  Date/Time Rate/Dose/Volume Action   09/23/20 1425 15 mL Given    bivalirudin (ANGIOMAX) BOLUS via infusion (mg/kg) Total dose:  57.075 mg Dosing weight:  76.1  Date/Time Rate/Dose/Volume Action   09/23/20 1457 57.075 mg Given    bivalirudin (ANGIOMAX) 250 mg in sodium chloride 0.9 % 50 mL (5 mg/mL) infusion (mg/kg/hr) Total dose:  28.07 mg Dosing weight:  76.1  Date/Time Rate/Dose/Volume Action   09/23/20 1459 1 mg/kg/hr - 15.2 mL/hr New Bag/Given   1521  Stopped    0.9 %  sodium chloride infusion (mL/hr) Total dose:  Cannot be calculated* Dosing weight:  76.1  *Continuous medication not stopped within the calculation time range. Date/Time Rate/Dose/Volume Action   09/23/20 1526 100 mL/hr New Bag/Given    iohexol (OMNIPAQUE) 350 MG/ML injection (mL) Total volume:  40 mL  Date/Time Rate/Dose/Volume Action   09/23/20 1549 40 mL Given  0.9 %  sodium chloride infusion (mL) Total dose:  Cannot be calculated* Dosing weight:  81.6  *Administration dose not documented Date/Time Rate/Dose/Volume Action   09/23/20 1354 *Not included in total MAR Hold    acetaminophen (TYLENOL) tablet 1,000 mg (mg) Total dose:  Cannot be calculated* Dosing weight:  81.6  *Administration dose not documented Date/Time Rate/Dose/Volume Action   09/23/20 1354 *Not included in total MAR Hold    amLODipine (NORVASC) tablet 5 mg (mg) Total dose:  Cannot be calculated*  *Administration dose not documented Date/Time Rate/Dose/Volume Action   09/23/20 1354 *Not included in total MAR Hold    carvedilol (COREG) tablet 6.25 mg (mg) Total dose:  Cannot be calculated*  *Administration dose not documented Date/Time Rate/Dose/Volume Action   09/23/20 1354 *Not included in total MAR Hold    clopidogrel (PLAVIX) tablet 75 mg (mg) Total dose:  Cannot be calculated*  *Administration dose not documented Date/Time Rate/Dose/Volume Action   09/23/20  1354 *Not included in total MAR Hold    docusate sodium (COLACE) capsule 100 mg (mg) Total dose:  Cannot be calculated* Dosing weight:  81.6  *Administration dose not documented Date/Time Rate/Dose/Volume Action   09/23/20 1354 *Not included in total MAR Hold    fentaNYL (SUBLIMAZE) injection 50 mcg (mcg) Total dose:  Cannot be calculated* Dosing weight:  81.8  *Administration dose not documented Date/Time Rate/Dose/Volume Action   09/23/20 1354 *Not included in total MAR Hold    fluticasone furoate-vilanterol (BREO ELLIPTA) 200-25 MCG/INH 1 puff (puff) Total dose:  Cannot be calculated* Dosing weight:  81.6  *Administration dose not documented Date/Time Rate/Dose/Volume Action   09/23/20 1354 *Not included in total MAR Hold    guaiFENesin-dextromethorphan (ROBITUSSIN DM) 100-10 MG/5ML syrup 5 mL (mL) Total dose:  Cannot be calculated* Dosing weight:  81.6  *Administration dose not documented Date/Time Rate/Dose/Volume Action   09/23/20 1354 *Not included in total MAR Hold    hydrocortisone cream 1 % 1 application (application) Total dose:  Cannot be calculated* Dosing weight:  81.6  *Administration dose not documented Date/Time Rate/Dose/Volume Action   09/23/20 1354 *Not included in total MAR Hold    insulin aspart (novoLOG) injection 0-9 Units (Units) Total dose:  Cannot be calculated* Dosing weight:  81.6  *Administration dose not documented Date/Time Rate/Dose/Volume Action   09/23/20 1354 *Not included in total MAR Hold    levalbuterol (XOPENEX) nebulizer solution 1.25 mg (mg) Total dose:  Cannot be calculated* Dosing weight:  76.9  *Administration dose not documented Date/Time Rate/Dose/Volume Action   09/23/20 1354 *Not included in total MAR Hold    linagliptin (TRADJENTA) tablet 5 mg (mg) Total dose:  Cannot be calculated* Dosing weight:  81.6  *Administration dose not documented Date/Time Rate/Dose/Volume Action   09/23/20 1354 *Not included in total  MAR Hold    melatonin tablet 10 mg (mg) Total dose:  Cannot be calculated* Dosing weight:  76.2  *Administration dose not documented Date/Time Rate/Dose/Volume Action   09/23/20 1354 *Not included in total MAR Hold    nitroGLYCERIN (NITROSTAT) SL tablet 0.4 mg (mg) Total dose:  Cannot be calculated* Dosing weight:  81.8  *Administration dose not documented Date/Time Rate/Dose/Volume Action   09/23/20 1354 *Not included in total MAR Hold    ondansetron (ZOFRAN) injection 4 mg (mg) Total dose:  Cannot be calculated* Dosing weight:  81.6  *Administration dose not documented Date/Time Rate/Dose/Volume Action   09/23/20 1354 *Not included in total MAR Hold    pantoprazole (PROTONIX) EC tablet 40 mg (mg)  Total dose:  Cannot be calculated*  *Administration dose not documented Date/Time Rate/Dose/Volume Action   09/23/20 1354 *Not included in total MAR Hold    rosuvastatin (CRESTOR) tablet 20 mg (mg) Total dose:  Cannot be calculated*  *Administration dose not documented Date/Time Rate/Dose/Volume Action   09/23/20 1354 *Not included in total MAR Hold    sodium chloride flush (NS) 0.9 % injection 3 mL (mL) Total dose:  Cannot be calculated* Dosing weight:  81.6  *Administration dose not documented Date/Time Rate/Dose/Volume Action   09/23/20 1354 *Not included in total MAR Hold    sodium chloride flush (NS) 0.9 % injection 3 mL (mL) Total dose:  Cannot be calculated* Dosing weight:  81.6  *Administration dose not documented Date/Time Rate/Dose/Volume Action   09/23/20 1354 *Not included in total MAR Hold    sodium chloride flush (NS) 0.9 % injection 3 mL (mL) Total dose:  Cannot be calculated* Dosing weight:  76.1  *Administration dose not documented Date/Time Rate/Dose/Volume Action   09/23/20 1354 *Not included in total MAR Hold     Sedation Time  Sedation Time Physician-1: 1 hour 32 seconds   Contrast  Medication Name Total Dose  iohexol (OMNIPAQUE) 350  MG/ML injection 40 mL    Radiation/Fluoro  Fluoro time: 16.6 (min) DAP: 50161 (mGycm2) Cumulative Air Kerma: 724 (mGy)   Coronary Findings   Diagnostic Dominance: Co-dominant  Left Main  Ost LM to Mid LM lesion is 50% stenosed. The lesion is moderately calcified.  LM lesion is 25% stenosed.  Left Anterior Descending  Mid LAD lesion is 80% stenosed. The lesion is located at the bend.  Ramus Intermedius  Vessel is small.  Left Circumflex  Mid Cx to Dist Cx lesion is 80% stenosed.  Right Coronary Artery  Prox RCA lesion is 100% stenosed.  Right Posterior Descending Artery  RPDA lesion is 30% stenosed.  Single Graft Graft To RPDA  and is normal in caliber. The graft exhibits minimal luminal irregularities. This graft is widely patent with no stenosis  Single Graft Graft To 1st RPL  and is normal in caliber. The graft exhibits minimal luminal irregularities.  Single Graft Graft To 2nd Mrg  and is normal in caliber.  Origin lesion is 40% stenosed. Does not appear flow obstructive, not significantly changed from the previous study  Dist Graft to Insertion lesion is 90% stenosed. The lesion is located at the bend. The lesion was previously treatedover 2 years ago. There is diffuse in-stent restenosis within the area of 2 layered DES, most severe at the graft insertion site at an area of angulation.  LIMA LIMA Graft To Dist LAD  LIMA. The LIMA to LAD graft is widely patent with no stenosis  Saphenous Graft To 1st LPL  SVG. Saphenous vein graft to left posterior lateral branch is widely patent with no stenosis   Intervention   Dist Graft to Insertion lesion (Single Graft Graft To 2nd Mrg)  Angioplasty  Lesion length: 18 mm. CATHETER LAUNCHER 6FR AL1 guide catheter was inserted. WIRE COUGAR XT STRL 190CM guidewire used to cross lesion. Balloon angioplasty was performed using a BALLOON SAPPHIRE [place] 3.5X12. Maximum pressure: 22 atm.  Post-Intervention Lesion Assessment  The  intervention was successful. Pre-interventional TIMI flow is 3. Post-intervention TIMI flow is 3. No complications occurred at this lesion.  There is a 20% residual stenosis post intervention.      Right Heart  Right Heart Pressures LV EDP is normal.        Left  Heart  Aortic Valve There is moderate aortic valve stenosis. The aortic valve is calcified. There is restricted aortic valve motion. Mean transaortic gradient 14 mmHg, calculated aortic valve area 1.37 cm    Coronary Diagrams   Diagnostic Dominance: Co-dominant    Intervention      Implants     No implant documentation for this case.    Syngo Images   Show images for CARDIAC CATHETERIZATION  Images on Long Term Storage   Show images for Gange, ROHAAN DURNIL to Procedure Log  Procedure Log      Hemo Data  Flowsheet Row Most Recent Value  Fick Cardiac Output 3.75 L/min  Fick Cardiac Output Index 1.96 (L/min)/BSA  Aortic Mean Gradient 14.31 mmHg  Aortic Peak Gradient 14 mmHg  Aortic Valve Area 1.37  Aortic Value Area Index 0.71 cm2/BSA  RA A Wave 8 mmHg  RA V Wave 9 mmHg  RA Mean 7 mmHg  RV Systolic Pressure 43 mmHg  RV Diastolic Pressure 3 mmHg  RV EDP 6 mmHg  PA Systolic Pressure 44 mmHg  PA Diastolic Pressure 13 mmHg  PA Mean 24 mmHg  PW A Wave 13 mmHg  PW V Wave 17 mmHg  PW Mean 13 mmHg  AO Systolic Pressure 993 mmHg  AO Diastolic Pressure 43 mmHg  AO Mean 69 mmHg  LV Systolic Pressure 716 mmHg  LV Diastolic Pressure 5 mmHg  LV EDP 8 mmHg  AOp Systolic Pressure 967 mmHg  AOp Diastolic Pressure 55 mmHg  AOp Mean Pressure 76 mmHg  LVp Systolic Pressure 893 mmHg  LVp Diastolic Pressure 6 mmHg  LVp EDP Pressure 9 mmHg  QP/QS 1  TPVR Index 12.26 HRUI  TSVR Index 35.25 HRUI  PVR SVR Ratio 0.18  TPVR/TSVR Ratio 0.35     Cardiac TAVR CT   TECHNIQUE: The patient was scanned on a Siemens Force 810 slice scanner. A 120 kV retrospective scan was triggered in the descending  thoracic aorta at 111 HU's. Gantry rotation speed was 270 msecs and collimation was .9 mm. No beta blockade or nitro were given. The 3D data set was reconstructed in 5% intervals of the R-R cycle. Systolic and diastolic phases were analyzed on a dedicated work station using MPR, MIP and VRT modes. The patient received 80 cc of contrast.   FINDINGS: Aortic Valve: Tri leaflet calcified with restricted leaflet motion Calcium score 1745   Aorta: Moderate calcific atherosclerosis no aneurysm normal arch vessels   Sinotubular Junction: 28 mm   Ascending Thoracic Aorta: 36 mm   Aortic Arch: 34 mm   Descending Thoracic Aorta: 28 mm   Sinus of Valsalva Measurements:   Non-coronary: 31.2 mm   Right - coronary: 30.35 mm   Left - coronary: 30.64 mm   Coronary Artery Height above Annulus:   Left Main: 14 mm above annulus   Right Coronary: 13 mm above annulus   Virtual Basal Annulus Measurements:   Maximum/Minimum Diameter: 25.8 mm x 20.2 mm   Perimeter: 75.3 mm   Area: 416 mm2   Coronary Arteries: Sufficient height above annulus for deployment See grafts below   CABG/GRAFTS: There are 3 patent SVG;s One to the PDA, two grafts to the OM circulation one is stented There is a patent LIMA to LAD   Optimum Fluoroscopic Angle for Delivery: LAO 23 Caudal 11 degrees   IMPRESSION: 1. Calcified tri leaflet AV with calcium score 1745   2.  Annular area of 416 mm2 upper limits for  a 23 mm Sapien 3 valve   3. Coronary arteries sufficient height above annulus for deployment with patent grafts see above   4. Optimum angiographic angle for deployment LAO 23 Caudal 11 degrees   Jenkins Rouge     Electronically Signed   By: Jenkins Rouge M.D.   On: 03/26/2021 18:33    CT ANGIOGRAPHY CHEST, ABDOMEN AND PELVIS   TECHNIQUE: Multidetector CT imaging through the chest, abdomen and pelvis was performed using the standard protocol during bolus administration of intravenous  contrast. Multiplanar reconstructed images and MIPs were obtained and reviewed to evaluate the vascular anatomy.   CONTRAST:  12m OMNIPAQUE IOHEXOL 350 MG/ML SOLN   COMPARISON:  09/21/2019 chest radiograph. 02/20/2019 CT abdomen/pelvis.   FINDINGS: CTA CHEST FINDINGS   Cardiovascular: Mild cardiomegaly. Diffusely thickened and coarsely calcified aortic valve. No significant pericardial effusion/thickening. Left main and 3 vessel coronary atherosclerosis status post CABG. Atherosclerotic nonaneurysmal thoracic aorta. Top-normal caliber main pulmonary artery (3.1 cm diameter). No central pulmonary emboli.   Mediastinum/Nodes: No discrete thyroid nodules. Unremarkable esophagus. No pathologically enlarged axillary, mediastinal or hilar lymph nodes. Coarsely calcified nonenlarged right paratracheal and right hilar nodes, compatible with prior granulomatous disease.   Lungs/Pleura: No pneumothorax. No pleural effusion. Symmetric mild smooth dependent bilateral pleural thickening without appreciable calcified pleural plaques. Chronic thick parenchymal bands in the dependent lower lobes bilaterally, similar to 02/20/2019 CT abdomen study at the lung bases, compatible with nonspecific postinflammatory scarring. New mild patchy consolidation and ground-glass opacity in the dependent right lower lobe (series 8/image 79), suggesting aspiration or pneumonia. No lung masses or significant pulmonary nodules. No central airway stenoses.   Musculoskeletal: No aggressive appearing focal osseous lesions. Intact sternotomy wires. Moderate thoracic spondylosis.   CTA ABDOMEN AND PELVIS FINDINGS   Hepatobiliary: Normal liver with no liver mass. Cholecystectomy no biliary ductal dilatation.   Pancreas: Normal, with no mass or duct dilation.   Spleen: Normal size. No mass.   Adrenals/Urinary Tract: Left adrenal 1.1 cm nodule with density 73 HU, stable in size since 02/20/2019 CT abdomen  study, compatible with an adenoma. No right adrenal nodules. No hydronephrosis. Coarse 11 mm parenchymal calcification in the interpolar left kidney, unchanged. Simple 1.0 cm lower right renal cyst. No additional contour deforming renal lesions. Normal bladder.   Stomach/Bowel: Normal non-distended stomach. Normal caliber small bowel with no small bowel wall thickening. Appendectomy. Normal large bowel with no diverticulosis, large bowel wall thickening or pericolonic fat stranding.   Vascular/Lymphatic: Atherosclerotic nonaneurysmal abdominal aorta. No pathologically enlarged lymph nodes in the abdomen or pelvis.   Reproductive: Prostatectomy. Partially visualized intact appearing penile prosthesis with anterior right pelvic reservoir.   Other: No pneumoperitoneum, ascites or focal fluid collection.   Musculoskeletal: No aggressive appearing focal osseous lesions. Marked lumbar spondylosis.   VASCULAR MEASUREMENTS PERTINENT TO TAVR:   AORTA:   Minimal Aortic Diameter-12.7 x 11.9 mm   Severity of Aortic Calcification-marked   RIGHT PELVIS:   Right Common Iliac Artery -   Minimal Diameter-8.8 x 8.7 mm   Tortuosity-mild   Calcification-severe   Right External Iliac Artery -   Minimal Diameter-7.4 x 7.1 mm   Tortuosity-mild-to-moderate   Calcification-mild   Right Common Femoral Artery -   Minimal Diameter-7.5 x 4.8 mm   Tortuosity-mild   Calcification-moderate   LEFT PELVIS:   Left Common Iliac Artery -   Minimal Diameter-10.4 x 6.4 mm   Tortuosity-mild   Calcification-marked   Left External Iliac Artery -   Minimal Diameter-9.2  x 6.9 mm   Tortuosity-mild-to-moderate   Calcification-mild   Left Common Femoral Artery -   Minimal Diameter-8.4 x 6.3 mm   Tortuosity-mild   Calcification-moderate   Review of the MIP images confirms the above findings.   IMPRESSION: 1. Vascular findings and measurements pertinent to potential  TAVR procedure, as detailed. 2. Diffusely thickened and coarsely calcified aortic valve compatible with reported history of severe aortic stenosis. 3. Mild cardiomegaly. 4. New mild patchy consolidation and ground-glass opacity in the dependent right lower lobe, suggesting aspiration or pneumonia. 5. Chronic thick parenchymal bands in the dependent lower lobes bilaterally, similar to 02/20/2019 CT abdomen study, compatible with nonspecific postinflammatory scarring. 6. Stable left adrenal adenoma. 7. Aortic Atherosclerosis (ICD10-I70.0).   These results will be called to the ordering clinician or representative by the Radiologist Assistant, and communication documented in the PACS or [employer].     Electronically Signed   By: Ilona Sorrel M.D.   On: 03/26/2021 13:11     Impression:  Patient has stage D2 severe aortic stenosis.  He describes a long history of progressive symptoms of exertional shortness of breath and chest pressure consistent with chronic combined systolic and diastolic congestive heart failure, New [place] Heart Association functional class III, bordering on class IV.  He continues to have chest pressure with shortness of breath which also could be anginal equivalent.  I have personally reviewed the patient's most recent transthoracic echocardiogram, diagnostic cardiac catheterization, and CT angiograms.  Echocardiogram reveals moderately reduced left ventricular systolic function with severe aortic stenosis.  The aortic valve is trileaflet with severe thickening, calcification, and restricted leaflet mobility involving all 3 leaflets.  Peak velocity across aortic valve measured only 2.7 m/s corresponding to mean transvalvular gradient estimated 29 mmHg but DVI was quite low at 0.20 and stroke-volume index only 21.  Diagnostic cardiac catheterization revealed severe native coronary artery disease with continued patency of all bypass grafts with exception of  high-grade stenosis involving saphenous vein graft to the obtuse marginal branch of left circumflex which was successfully treated with PCI.  Cardiac-gated CTA of the heart reveals anatomical characteristics consistent with aortic stenosis suitable for treatment by transcatheter aortic valve replacement without any significant complicating features and CTA of the aorta and iliac vessels demonstrate what appears to be adequate pelvic vascular access to facilitate a transfemoral approach.  Despite successful PCI the patient's symptoms have not improved.  Under the circumstances I agree the patient would best be treated with aortic valve replacement.  I would not consider this elderly patient candidate for conventional surgery via redo sternotomy under any circumstances and favor transcatheter aortic valve replacement.   Plan:  The patient and his wife were counseled at length regarding treatment alternatives for management of severe symptomatic aortic stenosis. Alternative approaches such as conventional aortic valve replacement, transcatheter aortic valve replacement, and continued medical therapy without intervention were compared and contrasted at length.  The risks associated with conventional surgical aortic valve replacement were discussed in detail, as were expectations for post-operative convalescence, and why I would be reluctant to consider this patient a candidate for conventional surgery.  Issues specific to transcatheter aortic valve replacement were discussed including questions about long term valve durability, the potential for paravalvular leak, possible increased risk of need for permanent pacemaker placement, and other technical complications related to the procedure itself.  Long-term prognosis with medical therapy was discussed. This discussion was placed in the context of the patient's own specific clinical presentation and past medical  history.  All of their questions have been addressed.   The patient desires to proceed with transcatheter aortic valve replacement as soon as practical.  We tenably plan for surgery on April 28, 2021.  Patient and his wife understand and accept all potential associated risks of the procedure including but not limited to risk of death, stroke, myocardial infarction, device embolization or failure, bleeding, aortic rupture or aortic dissection, pericardial tamponade, other major peripheral vascular complication, paravalvular leak, complete heart block requiring permanent pacemaker, valve thrombosis or embolization, endocarditis, late structural valve deterioration and failure.  After discussion of the potential technical complications the patient understands that he would not be considered candidate for emergency redo median sternotomy in the event of catastrophic intraoperative complications.  All of his questions have been addressed.    I spent in excess of 90 minutes during the conduct of this office consultation and >50% of this time involved direct face-to-face encounter with the patient for counseling and/or coordination of their care.   Valentina Gu. [name], MD 04/14/2021 3:11 PM

## 2021-04-14 NOTE — Patient Instructions (Signed)
Stop taking Eliquis after you take your medications on July 7th  Continue taking all other medications without change through the day before surgery.  Make sure to bring all of your medications with you when you come for your Pre-Admission Testing appointment at [facility] Short-Stay Department.  Have nothing to eat or drink after midnight the night before surgery.  On the morning of surgery take only Plavix and Protonix with a sip of water.  You may take Spiriva if desired.  At your appointment for Pre-Admission Testing at the [facility] Short-Stay Department you will be asked to sign permission forms for your upcoming surgery.  By definition your signature on these forms implies that you and/or your designee provide full informed consent for your planned surgical procedure(s), that alternative treatment options have been discussed, that you understand and accept any and all potential risks, and that you have some understanding of what to expect for your post-operative convalescence.  For any major cardiac surgical procedure potential operative risks include but are not limited to at least some risk of death, stroke or other neurologic complication, myocardial infarction, congestive heart failure, respiratory failure, renal failure, bleeding requiring blood transfusion and/or reexploration, irregular heart rhythm, heart block or bradycardia requiring permanent pacemaker, pneumonia, pericardial effusion, pleural effusion, wound infection, pulmonary embolus or other thromboembolic complication, chronic pain, or other complications related to the specific procedure(s) performed.  For transcatheter aortic valve replacement additional risks include but are not limited to risk of paravalvular leak, valve embolization, valve thrombosis, aortic dissection, aortic rupture, ventricular septal defect or perforation, pericardial tamponade, injury of the abdominal aorta or its  branches, and/or injury or occlusion of the arteries going to your arms or legs.  Please call to schedule a follow-up appointment in our office prior to surgery if you have any unresolved questions about your planned surgical procedure, the associated risks, alternative treatment options, and/or expectations for your post-operative recovery.

## 2021-04-16 ENCOUNTER — Other Ambulatory Visit: Payer: Self-pay

## 2021-04-16 ENCOUNTER — Encounter: Payer: Medicare PPO | Admitting: Thoracic Surgery (Cardiothoracic Vascular Surgery)

## 2021-04-16 DIAGNOSIS — I350 Nonrheumatic aortic (valve) stenosis: Secondary | ICD-10-CM

## 2021-04-24 ENCOUNTER — Other Ambulatory Visit (HOSPITAL_COMMUNITY): Payer: Medicare PPO

## 2021-04-24 NOTE — Pre-Procedure Instructions (Signed)
Surgical Instructions   Your procedure is scheduled on Tuesday, July 12th, 2022. Report to [facility] Main Entrance "A" at 08:45 A.M., then check in with the Admitting office. Call this number if you have problems the morning of surgery: [phone]   If you have any questions prior to your surgery date call [phone]: Open Monday-Friday 8am-4pm   Remember: Do not eat or drink after midnight the night before your surgery   Stop taking Eliquis on 7/8. You will take your last dose on 7/7 (Thursday).   On the morning of surgery take only Plavix and Protonix with a sip of water.  You may take Spiriva if desired.   As of today, STOP taking any Aleve, Naproxen, Ibuprofen, Motrin, Advil, Goody's, BC's, all herbal medications, fish oil, and all vitamins.   WHAT DO I DO ABOUT MY DIABETES MEDICATION?  Do not take JANUVIA 50 MG  the morning of surgery.   HOW TO MANAGE YOUR DIABETES BEFORE AND AFTER SURGERY  Why is it important to control my blood sugar before and after surgery? Improving blood sugar levels before and after surgery helps healing and can limit problems. A way of improving blood sugar control is eating a healthy diet by:  Eating less sugar and carbohydrates  Increasing activity/exercise  Talking with your doctor about reaching your blood sugar goals High blood sugars (greater than 180 mg/dL) can raise your risk of infections and slow your recovery, so you will need to focus on controlling your diabetes during the weeks before surgery. Make sure that the doctor who takes care of your diabetes knows about your planned surgery including the date and location.  How do I manage my blood sugar before surgery? Check your blood sugar at least 4 times a day, starting 2 days before surgery, to make sure that the level is not too high or low.  Check your blood sugar the morning of your surgery when you wake up and every 2 hours until you get to the Short Stay unit.  If your  blood sugar is less than 70 mg/dL, you will need to treat for low blood sugar: Do not take insulin. Treat a low blood sugar (less than 70 mg/dL) with  cup of clear juice (cranberry or apple), 4 glucose tablets, OR glucose gel. Recheck blood sugar in 15 minutes after treatment (to make sure it is greater than 70 mg/dL). If your blood sugar is not greater than 70 mg/dL on recheck, call [phone] for further instructions. Report your blood sugar to the short stay nurse when you get to Short Stay.  If you are admitted to the hospital after surgery: Your blood sugar will be checked by the staff and you will probably be given insulin after surgery (instead of oral diabetes medicines) to make sure you have good blood sugar levels. The goal for blood sugar control after surgery is 80-180 mg/dL.           Do not wear jewelry  Do not wear lotions, powders, colognes, or deodorant. Men may shave face and neck. Do not bring valuables to the hospital.   [facility] is not responsible for any belongings or valuables.  Do NOT Smoke (Tobacco/Vaping) or drink Alcohol 24 hours prior to your procedure If you use a CPAP at night, you may bring all equipment for your overnight stay.   Contacts, glasses, dentures or bridgework may not be worn into surgery, please bring cases for these belongings   For patients admitted to  the hospital, discharge time will be determined by your treatment team.   Patients discharged the day of surgery will not be allowed to drive home, and someone needs to stay with them for 24 hours.  ONLY 1 SUPPORT PERSON MAY BE PRESENT WHILE YOU ARE IN SURGERY. IF YOU ARE TO BE ADMITTED ONCE YOU ARE IN YOUR ROOM YOU WILL BE ALLOWED TWO (2) VISITORS.  Minor children may have two parents present. Special consideration for safety and communication needs will be reviewed on a case by case basis.  Special instructions:    Oral Hygiene is also important to reduce your risk of infection.   Remember - BRUSH YOUR TEETH THE MORNING OF SURGERY WITH YOUR REGULAR TOOTHPASTE   [place]- Preparing For Surgery  Before surgery, you can play an important role. Because skin is not sterile, your skin needs to be as free of germs as possible. You can reduce the number of germs on your skin by washing with CHG (chlorahexidine gluconate) Soap before surgery.  CHG is an antiseptic cleaner which kills germs and bonds with the skin to continue killing germs even after washing.     Please do not use if you have an allergy to CHG or antibacterial soaps. If your skin becomes reddened/irritated stop using the CHG.  Do not shave (including legs and underarms) for at least 48 hours prior to first CHG shower. It is OK to shave your face.  Please follow these instructions carefully.     Shower the NIGHT BEFORE SURGERY and the MORNING OF SURGERY with CHG Soap.   If you chose to wash your hair, wash your hair first as usual with your normal shampoo. After you shampoo, rinse your hair and body thoroughly to remove the shampoo.  Then [employer] and genitals (private parts) with your normal soap and rinse thoroughly to remove soap.  After that Use CHG Soap as you would any other liquid soap. You can apply CHG directly to the skin and wash gently with a scrungie or a clean washcloth.   Apply the CHG Soap to your body ONLY FROM THE NECK DOWN.  Do not use on open wounds or open sores. Avoid contact with your eyes, ears, mouth and genitals (private parts). Wash Face and genitals (private parts)  with your normal soap.   Wash thoroughly, paying special attention to the area where your surgery will be performed.  Thoroughly rinse your body with warm water from the neck down.  DO NOT shower/wash with your normal soap after using and rinsing off the CHG Soap.  Pat yourself dry with a CLEAN TOWEL.  Wear CLEAN PAJAMAS to bed the night before surgery  Place CLEAN SHEETS on your bed the night before your  surgery  DO NOT SLEEP WITH PETS.   Day of Surgery:  Take a shower with CHG soap. Wear Clean/Comfortable clothing the morning of surgery Do not apply any deodorants/lotions.   Remember to brush your teeth WITH YOUR REGULAR TOOTHPASTE.   Please read over the following fact sheets that you were given.

## 2021-04-27 ENCOUNTER — Emergency Department (HOSPITAL_COMMUNITY): Payer: Medicare PPO

## 2021-04-27 ENCOUNTER — Inpatient Hospital Stay (HOSPITAL_COMMUNITY)
Admission: EM | Admit: 2021-04-27 | Discharge: 2021-04-29 | DRG: 267 | Disposition: A | Discharge status: Home / Self Care | Payer: Medicare PPO | Attending: Cardiovascular Disease | Admitting: Cardiovascular Disease

## 2021-04-27 ENCOUNTER — Other Ambulatory Visit: Payer: Self-pay

## 2021-04-27 ENCOUNTER — Encounter (HOSPITAL_COMMUNITY)
Admission: RE | Admit: 2021-04-27 | Discharge: 2021-04-27 | Disposition: A | Discharge status: Home / Self Care | Payer: Medicare PPO | Source: Ambulatory Visit | Attending: Cardiovascular Disease | Admitting: Cardiovascular Disease

## 2021-04-27 ENCOUNTER — Ambulatory Visit (HOSPITAL_COMMUNITY): Admission: RE | Admit: 2021-04-27 | Payer: Medicare PPO | Source: Ambulatory Visit

## 2021-04-27 ENCOUNTER — Encounter (HOSPITAL_COMMUNITY): Payer: Self-pay | Admitting: *Deleted

## 2021-04-27 DIAGNOSIS — Z79899 Other long term (current) drug therapy: Secondary | ICD-10-CM | POA: Diagnosis not present

## 2021-04-27 DIAGNOSIS — Z951 Presence of aortocoronary bypass graft: Secondary | ICD-10-CM

## 2021-04-27 DIAGNOSIS — N183 Chronic kidney disease, stage 3 unspecified: Secondary | ICD-10-CM | POA: Diagnosis present

## 2021-04-27 DIAGNOSIS — K219 Gastro-esophageal reflux disease without esophagitis: Secondary | ICD-10-CM | POA: Diagnosis present

## 2021-04-27 DIAGNOSIS — J9811 Atelectasis: Secondary | ICD-10-CM

## 2021-04-27 DIAGNOSIS — Z87442 Personal history of urinary calculi: Secondary | ICD-10-CM

## 2021-04-27 DIAGNOSIS — Z87891 Personal history of nicotine dependence: Secondary | ICD-10-CM | POA: Diagnosis not present

## 2021-04-27 DIAGNOSIS — Z7901 Long term (current) use of anticoagulants: Secondary | ICD-10-CM

## 2021-04-27 DIAGNOSIS — Z955 Presence of coronary angioplasty implant and graft: Secondary | ICD-10-CM

## 2021-04-27 DIAGNOSIS — I25118 Atherosclerotic heart disease of native coronary artery with other forms of angina pectoris: Secondary | ICD-10-CM

## 2021-04-27 DIAGNOSIS — I4819 Other persistent atrial fibrillation: Secondary | ICD-10-CM | POA: Diagnosis present

## 2021-04-27 DIAGNOSIS — Z01818 Encounter for other preprocedural examination: Secondary | ICD-10-CM | POA: Insufficient documentation

## 2021-04-27 DIAGNOSIS — I2510 Atherosclerotic heart disease of native coronary artery without angina pectoris: Secondary | ICD-10-CM | POA: Diagnosis present

## 2021-04-27 DIAGNOSIS — M1990 Unspecified osteoarthritis, unspecified site: Secondary | ICD-10-CM | POA: Diagnosis present

## 2021-04-27 DIAGNOSIS — I350 Nonrheumatic aortic (valve) stenosis: Principal | ICD-10-CM | POA: Diagnosis present

## 2021-04-27 DIAGNOSIS — I517 Cardiomegaly: Secondary | ICD-10-CM | POA: Diagnosis not present

## 2021-04-27 DIAGNOSIS — J811 Chronic pulmonary edema: Secondary | ICD-10-CM | POA: Diagnosis not present

## 2021-04-27 DIAGNOSIS — Z8546 Personal history of malignant neoplasm of prostate: Secondary | ICD-10-CM | POA: Diagnosis not present

## 2021-04-27 DIAGNOSIS — Z7902 Long term (current) use of antithrombotics/antiplatelets: Secondary | ICD-10-CM

## 2021-04-27 DIAGNOSIS — I447 Left bundle-branch block, unspecified: Secondary | ICD-10-CM | POA: Diagnosis present

## 2021-04-27 DIAGNOSIS — Z006 Encounter for examination for normal comparison and control in clinical research program: Secondary | ICD-10-CM | POA: Diagnosis not present

## 2021-04-27 DIAGNOSIS — I5032 Chronic diastolic (congestive) heart failure: Secondary | ICD-10-CM | POA: Diagnosis present

## 2021-04-27 DIAGNOSIS — J4540 Moderate persistent asthma, uncomplicated: Secondary | ICD-10-CM | POA: Diagnosis present

## 2021-04-27 DIAGNOSIS — E7800 Pure hypercholesterolemia, unspecified: Secondary | ICD-10-CM | POA: Diagnosis present

## 2021-04-27 DIAGNOSIS — Z952 Presence of prosthetic heart valve: Secondary | ICD-10-CM | POA: Diagnosis not present

## 2021-04-27 DIAGNOSIS — I5043 Acute on chronic combined systolic (congestive) and diastolic (congestive) heart failure: Secondary | ICD-10-CM | POA: Diagnosis not present

## 2021-04-27 DIAGNOSIS — Z8249 Family history of ischemic heart disease and other diseases of the circulatory system: Secondary | ICD-10-CM

## 2021-04-27 DIAGNOSIS — T8203XA Leakage of heart valve prosthesis, initial encounter: Secondary | ICD-10-CM | POA: Diagnosis not present

## 2021-04-27 DIAGNOSIS — Z885 Allergy status to narcotic agent status: Secondary | ICD-10-CM

## 2021-04-27 DIAGNOSIS — I4891 Unspecified atrial fibrillation: Secondary | ICD-10-CM | POA: Diagnosis not present

## 2021-04-27 DIAGNOSIS — E1122 Type 2 diabetes mellitus with diabetic chronic kidney disease: Secondary | ICD-10-CM | POA: Diagnosis present

## 2021-04-27 DIAGNOSIS — Z20822 Contact with and (suspected) exposure to covid-19: Secondary | ICD-10-CM | POA: Diagnosis present

## 2021-04-27 DIAGNOSIS — I130 Hypertensive heart and chronic kidney disease with heart failure and stage 1 through stage 4 chronic kidney disease, or unspecified chronic kidney disease: Secondary | ICD-10-CM | POA: Diagnosis present

## 2021-04-27 DIAGNOSIS — N184 Chronic kidney disease, stage 4 (severe): Secondary | ICD-10-CM | POA: Diagnosis not present

## 2021-04-27 LAB — CBC WITH DIFFERENTIAL/PLATELET
Abs Immature Granulocytes: 0.07 10*3/uL (ref 0.00–0.07)
Basophils Absolute: 0 10*3/uL (ref 0.0–0.1)
Basophils Relative: 0 %
Eosinophils Absolute: 0.2 10*3/uL (ref 0.0–0.5)
Eosinophils Relative: 1 %
HCT: 39.1 % (ref 39.0–52.0)
Hemoglobin: 13.1 g/dL (ref 13.0–17.0)
Immature Granulocytes: 1 %
Lymphocytes Relative: 9 %
Lymphs Abs: 1 10*3/uL (ref 0.7–4.0)
MCH: 28.4 pg (ref 26.0–34.0)
MCHC: 33.5 g/dL (ref 30.0–36.0)
MCV: 84.6 fL (ref 80.0–100.0)
Monocytes Absolute: 1.3 10*3/uL — ABNORMAL HIGH (ref 0.1–1.0)
Monocytes Relative: 11 %
Neutro Abs: 8.6 10*3/uL — ABNORMAL HIGH (ref 1.7–7.7)
Neutrophils Relative %: 78 %
Platelets: 122 10*3/uL — ABNORMAL LOW (ref 150–400)
RBC: 4.62 MIL/uL (ref 4.22–5.81)
RDW: 15.9 % — ABNORMAL HIGH (ref 11.5–15.5)
WBC: 11.1 10*3/uL — ABNORMAL HIGH (ref 4.0–10.5)
nRBC: 0 % (ref 0.0–0.2)

## 2021-04-27 LAB — COMPREHENSIVE METABOLIC PANEL
ALT: 19 U/L (ref 0–44)
AST: 14 U/L — ABNORMAL LOW (ref 15–41)
Albumin: 3.4 g/dL — ABNORMAL LOW (ref 3.5–5.0)
Alkaline Phosphatase: 97 U/L (ref 38–126)
Anion gap: 10 (ref 5–15)
BUN: 27 mg/dL — ABNORMAL HIGH (ref 8–23)
CO2: 24 mmol/L (ref 22–32)
Calcium: 9.5 mg/dL (ref 8.9–10.3)
Chloride: 100 mmol/L (ref 98–111)
Creatinine, Ser: 1.8 mg/dL — ABNORMAL HIGH (ref 0.61–1.24)
GFR, Estimated: 36 mL/min — ABNORMAL LOW (ref >60)
Glucose, Bld: 210 mg/dL — ABNORMAL HIGH (ref 70–99)
Potassium: 3.7 mmol/L (ref 3.5–5.1)
Sodium: 134 mmol/L — ABNORMAL LOW (ref 135–145)
Total Bilirubin: 1.5 mg/dL — ABNORMAL HIGH (ref 0.3–1.2)
Total Protein: 6.8 g/dL (ref 6.5–8.1)

## 2021-04-27 LAB — MAGNESIUM: Magnesium: 1.9 mg/dL (ref 1.7–2.4)

## 2021-04-27 LAB — CBG MONITORING, ED
Glucose-Capillary: 227 mg/dL — ABNORMAL HIGH (ref 70–99)
Glucose-Capillary: 237 mg/dL — ABNORMAL HIGH (ref 70–99)

## 2021-04-27 LAB — TYPE AND SCREEN
ABO/RH(D): O POS
Antibody Screen: NEGATIVE

## 2021-04-27 LAB — PROTIME-INR
INR: 1.1 (ref 0.8–1.2)
Prothrombin Time: 14 seconds (ref 11.4–15.2)

## 2021-04-27 LAB — TROPONIN I (HIGH SENSITIVITY)
Troponin I (High Sensitivity): 59 ng/L — ABNORMAL HIGH (ref <18)
Troponin I (High Sensitivity): 62 ng/L — ABNORMAL HIGH (ref <18)

## 2021-04-27 LAB — BRAIN NATRIURETIC PEPTIDE: B Natriuretic Peptide: 695 pg/mL — ABNORMAL HIGH (ref 0.0–100.0)

## 2021-04-27 LAB — GLUCOSE, CAPILLARY: Glucose-Capillary: 266 mg/dL — ABNORMAL HIGH (ref 70–99)

## 2021-04-27 MED ORDER — CARVEDILOL 6.25 MG PO TABS: 6.2500 mg | ORAL_TABLET | Freq: Two times a day (BID) | ORAL | Status: DC

## 2021-04-27 MED ORDER — DIPHENHYDRAMINE HCL 25 MG PO CAPS
25.0000 mg | ORAL_CAPSULE | Freq: Every evening | ORAL | Status: DC | PRN
  Administered 2021-04-27 – 2021-04-28 (×2): 25 mg via ORAL
  Filled 2021-04-27 (×2): qty 1

## 2021-04-27 MED ORDER — NITROGLYCERIN 0.4 MG SL SUBL: 0.4000 mg | SUBLINGUAL_TABLET | SUBLINGUAL | Status: DC | PRN

## 2021-04-27 MED ORDER — ROSUVASTATIN CALCIUM 20 MG PO TABS
20.0000 mg | ORAL_TABLET | Freq: Every day | ORAL | Status: DC
  Administered 2021-04-28: 20 mg via ORAL
  Filled 2021-04-27: qty 1

## 2021-04-27 MED ORDER — ASPIRIN 81 MG PO CHEW
324.0000 mg | CHEWABLE_TABLET | Freq: Once | ORAL | Status: AC
  Administered 2021-04-27: 324 mg via ORAL
  Filled 2021-04-27: qty 4

## 2021-04-27 MED ORDER — POTASSIUM CHLORIDE 2 MEQ/ML IV SOLN
80.0000 meq | INTRAVENOUS | Status: DC
  Filled 2021-04-27: qty 40

## 2021-04-27 MED ORDER — INSULIN ASPART 100 UNIT/ML IJ SOLN
0.0000 [IU] | Freq: Every day | INTRAMUSCULAR | Status: DC
  Administered 2021-04-27: 2 [IU] via SUBCUTANEOUS

## 2021-04-27 MED ORDER — DEXMEDETOMIDINE HCL IN NACL 400 MCG/100ML IV SOLN
0.1000 ug/kg/h | INTRAVENOUS | Status: DC
  Filled 2021-04-27: qty 100

## 2021-04-27 MED ORDER — CLOPIDOGREL BISULFATE 75 MG PO TABS
75.0000 mg | ORAL_TABLET | Freq: Every day | ORAL | Status: DC
  Administered 2021-04-28: 75 mg via ORAL
  Filled 2021-04-27: qty 1

## 2021-04-27 MED ORDER — CEFAZOLIN SODIUM-DEXTROSE 2-4 GM/100ML-% IV SOLN
2.0000 g | INTRAVENOUS | Status: DC
  Filled 2021-04-27: qty 100

## 2021-04-27 MED ORDER — SODIUM CHLORIDE 0.9 % IV SOLN
INTRAVENOUS | Status: DC
  Filled 2021-04-27: qty 30

## 2021-04-27 MED ORDER — AMLODIPINE BESYLATE 5 MG PO TABS
5.0000 mg | ORAL_TABLET | Freq: Every day | ORAL | Status: DC
  Administered 2021-04-29: 5 mg via ORAL
  Filled 2021-04-27: qty 1

## 2021-04-27 MED ORDER — INSULIN ASPART 100 UNIT/ML IJ SOLN
0.0000 [IU] | Freq: Three times a day (TID) | INTRAMUSCULAR | Status: DC
  Administered 2021-04-27: 3 [IU] via SUBCUTANEOUS
  Administered 2021-04-28 (×2): 2 [IU] via SUBCUTANEOUS
  Administered 2021-04-29: 3 [IU] via SUBCUTANEOUS

## 2021-04-27 MED ORDER — PANTOPRAZOLE SODIUM 40 MG PO TBEC
40.0000 mg | DELAYED_RELEASE_TABLET | Freq: Every day | ORAL | Status: DC
  Administered 2021-04-28 – 2021-04-29 (×2): 40 mg via ORAL
  Filled 2021-04-27 (×2): qty 1

## 2021-04-27 MED ORDER — ONDANSETRON HCL 4 MG/2ML IJ SOLN: 4.0000 mg | Freq: Four times a day (QID) | INTRAMUSCULAR | Status: DC | PRN

## 2021-04-27 MED ORDER — DOCUSATE SODIUM 100 MG PO CAPS: 100.0000 mg | ORAL_CAPSULE | Freq: Two times a day (BID) | ORAL | Status: DC | PRN

## 2021-04-27 MED ORDER — ALBUTEROL SULFATE HFA 108 (90 BASE) MCG/ACT IN AERS
2.0000 | INHALATION_SPRAY | Freq: Four times a day (QID) | RESPIRATORY_TRACT | Status: DC | PRN
  Filled 2021-04-27: qty 6.7

## 2021-04-27 MED ORDER — MAGNESIUM SULFATE 50 % IJ SOLN
40.0000 meq | INTRAMUSCULAR | Status: DC
  Filled 2021-04-27: qty 9.85

## 2021-04-27 MED ORDER — FLUOXETINE HCL 10 MG PO CAPS
10.0000 mg | ORAL_CAPSULE | Freq: Every day | ORAL | Status: DC
  Administered 2021-04-29: 10 mg via ORAL
  Filled 2021-04-27 (×2): qty 1

## 2021-04-27 MED ORDER — ACETAMINOPHEN 325 MG PO TABS
650.0000 mg | ORAL_TABLET | ORAL | Status: DC | PRN
  Administered 2021-04-27: 650 mg via ORAL
  Filled 2021-04-27: qty 2

## 2021-04-27 MED ORDER — ROSUVASTATIN CALCIUM 20 MG PO TABS: 20.0000 mg | ORAL_TABLET | Freq: Every day | ORAL | Status: DC

## 2021-04-27 MED ORDER — NOREPINEPHRINE 4 MG/250ML-% IV SOLN
0.0000 ug/min | INTRAVENOUS | Status: DC
  Filled 2021-04-27: qty 250

## 2021-04-27 NOTE — ED Provider Notes (Signed)
[facility] EMERGENCY DEPARTMENT Provider Note   CSN: [id number] Arrival date & time: 04/27/21  1147     History Chief Complaint  Patient presents with   Chest Pain    [name] is a 87 y.o. male.  HPI Patient presents from preop after initially arriving for evaluation for anticipated valve repair tomorrow. He is here with his wife who assists with history.  He has had 2 episodes of chest pain, 1 yesterday, 1 this morning while in preop.  Currently he has minimal pressure, no true pain, as well as mild unsettled abdomen, but no discomfort there either. No recent fevers, chills, nausea, vomiting. With consideration of chest pain preop he was sent here for evaluation.     Past Medical History:  Diagnosis Date   Aortic stenosis, moderate 09/20/2020   Arthritis    CAD (coronary artery disease)    a. Cath September 2015 LIMA to the LAD patent, SVG to PDA patent, SVG to posterior lateral patent, SVG to OM with a 90% in-stent restenosis at an anastomotic lesion. This was treated with angioplasty. b. cath 04/01/2015 95% ISR in SVG to OM treated with 2.75x24 Synergy DES postdilated to 3.80m, all other grafts patent   Cataract    surgery,B/L   CHF (congestive heart failure) (HCC)    Chronic kidney disease    nephrolithiasis   Diabetes mellitus    TYPE 2   GERD (gastroesophageal reflux disease)    Hernia    Hypercholesterolemia    Hypertension    Incontinence    hx  over 1 year,leaks without awareness   Other and unspecified diseases of appendix    PAF (paroxysmal atrial fibrillation) ([facility]    in the setting of ischemia on 03/31/2015   Pancreatitis    Prostate CA ([facility] 03/01/04   prostate bx=Adenocarcinoam,gleason 3+4=7,PSA=6.75   Shortness of breath dyspnea    Ulcer    hx gastric    Patient Active Problem List   Diagnosis Date Noted   Angina at rest ([facility] 09/20/2020   Chronic diastolic (congestive) heart failure ([facility] 09/20/2020   COPD with acute  bronchitis ([facility] 09/08/2020   Acute exacerbation of CHF (congestive heart failure) ([facility] 09/07/2020   COPD mixed type ([facility] 094/76/5465  Chronic systolic HF (heart failure) ([facility] 003/54/6568  Systolic dysfunction with acute on chronic heart failure ([facility] 10/23/2019   Educated about COVID-19 virus infection 10/23/2019   Persistent atrial fibrillation ([facility] 08/29/2019   Nonrheumatic aortic valve stenosis 08/29/2019   Elevated troponin 07/27/2019   HLD (hyperlipidemia) 07/27/2019   GERD (gastroesophageal reflux disease) 07/27/2019   Congestive heart failure (CHF) ([facility] 07/26/2019   Pure hypercholesterolemia    Acute on chronic diastolic CHF (congestive heart failure) (HCC)    SOB (shortness of breath) 06/04/2019   CKD (chronic kidney disease), stage III ([facility] 06/04/2019   Coronary artery disease of native artery of native heart with stable angina pectoris ([facility] 03/24/2018   Bilateral carotid artery stenosis 07/23/2017   Palpitation 07/23/2017   Urinary urgency 07/28/2016   PAF (paroxysmal atrial fibrillation) (HCC)    DOE (dyspnea on exertion) 07/17/2014   Acute on chronic renal insufficiency- ACE and diuretics held 012/75/1700  Diastolic dysfunction- moderate on echo 2013 07/17/2014   Aortic stenosis 07/17/2014   LBBB (left bundle branch block) 07/17/2014   Chest pain 07/17/2014   Crescendo angina ([facility] 07/16/2014   CKD (chronic kidney disease) stage 4, GFR 15-29 ml/min (HCC) 12/03/2013   Squamous cell cancer  of skin of hand 05/31/2013   HEAD TRAUMA, CLOSED 09/24/2010   Ischemic cardiomyopathy- last EF Normal Myoview Jan 2015 04/02/2010   INSOMNIA 04/02/2010   Moderate bilateral ICA disease by doppler June 2015 01/08/2010   DYSPHAGIA UNSPECIFIED 09/05/2009   Hx GERD/PUD 09/03/2009   CHOLELITHIASIS, WITH CHOLECYSTITIS 09/03/2009   ARTHRITIS 07/16/2009   HYPOKALEMIA 11/06/2008   CAD in native artery 11/06/2008   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 ([place])  11/23/2007   Dyslipidemia 11/23/2007   ROSACEA 11/23/2007   DYSPNEA ON EXERTION 11/23/2007   UNS ADVRS EFF OTH RX MEDICINAL&BIOLOGICAL SBSTNC 11/23/2007   HTN (hypertension) 11/22/2007   Hx of Prostate ca 03/01/2004    Past Surgical History:  Procedure Laterality Date   APPENDECTOMY     BACK SURGERY     CARDIAC CATHETERIZATION N/A 04/01/2015   Procedure: Left Heart Cath and Cors/Grafts Angiography;  Surgeon: Jettie Booze, MD;  Location: [place] CV LAB;  Service: Cardiovascular;  Laterality: N/A;   CARDIAC CATHETERIZATION N/A 04/01/2015   Procedure: Coronary Stent Intervention;  Surgeon: Jettie Booze, MD;  Location: [place] CV LAB;  Service: Cardiovascular;  Laterality: N/A;   CARDIOVERSION N/A 11/01/2019   Procedure: CARDIOVERSION;  Surgeon: Geralynn Rile, MD;  Location: [facility] ENDOSCOPY;  Service: Cardiovascular;  Laterality: N/A;   CARPAL TUNNEL RELEASE  2004   both hands   CORONARY ARTERY BYPASS GRAFT     CORONARY STENT INTERVENTION N/A 09/23/2020   Procedure: CORONARY STENT INTERVENTION;  Surgeon: Sherren Mocha, MD;  Location: [place] CV LAB;  Service: Cardiovascular;  Laterality: N/A;   CYSTOSCOPY  08/23/12   incomplete emoptying bladder   HERNIA REPAIR     incision and drainage of right chest abscess     LAPAROSCOPIC CHOLECYSTECTOMY  09/2009   LEFT HEART CATHETERIZATION WITH CORONARY ANGIOGRAM N/A 07/19/2014   Procedure: LEFT HEART CATHETERIZATION WITH CORONARY ANGIOGRAM;  Surgeon: Leonie Man, MD;  Location: [facility] CATH LAB;  Service: Cardiovascular;  Laterality: N/A;   RECTAL SURGERY     RIGHT/LEFT HEART CATH AND CORONARY/GRAFT ANGIOGRAPHY N/A 09/23/2020   Procedure: RIGHT/LEFT HEART CATH AND CORONARY/GRAFT ANGIOGRAPHY;  Surgeon: Sherren Mocha, MD;  Location: [place] CV LAB;  Service: Cardiovascular;  Laterality: N/A;   ROBOT ASSISTED LAPAROSCOPIC RADICAL PROSTATECTOMY  2005       Family History  Problem Relation Age of Onset   Cancer  Mother        stomach   Heart attack Father     Social History   Tobacco Use   Smoking status: Former    Packs/day: 0.50    Years: 5.00    Pack years: 2.50    Types: Cigarettes    Quit date: 07/20/1972    Years since quitting: 48.8   Smokeless tobacco: Never   Tobacco comments:    quit s  Vaping Use   Vaping Use: Never used  Substance Use Topics   Alcohol use: No   Drug use: No    Comment: quit smoking 40 years ago    Home Medications Prior to Admission medications   Medication Sig Start Date End Date Taking? Authorizing Provider  acetaminophen (TYLENOL) 500 MG tablet Take 1,000 mg by mouth every 4 (four) hours as needed for headache (pain).    [provider]  albuterol (VENTOLIN HFA) 108 (90 Base) MCG/ACT inhaler Inhale 2 puffs into the lungs every 6 (six) hours as needed. Patient taking differently: Inhale 2 puffs into the lungs every 6 (  six) hours as needed for wheezing or shortness of breath. 08/12/20   Spero Geralds, MD  amLODipine (NORVASC) 5 MG tablet Take 1 tablet (5 mg total) by mouth daily. 07/30/20   Burchette, Alinda Sierras, MD  blood glucose meter kit and supplies KIT Dispense based on patient and insurance preference. Use up to four times daily as directed. (FOR ICD-9 250.00, 250.01). 09/08/20   [place] Shi, MD  budesonide-formoterol (SYMBICORT) 160-4.5 MCG/ACT inhaler Inhale 2 puffs into the lungs in the morning and at bedtime. Patient not taking: No sig reported 08/07/20   Spero Geralds, MD  Carboxymethylcellul-Glycerin (LUBRICATING EYE DROPS OP) Place 1 drop into both eyes daily as needed (dry eyes).    [provider]  carvedilol (COREG) 6.25 MG tablet TAKE 1 TABLET (6.25 MG TOTAL) BY MOUTH 2 (TWO) TIMES DAILY WITH A MEAL. 08/27/20   [name], MD  clopidogrel (PLAVIX) 75 MG tablet TAKE 1 TABLET (75 MG TOTAL) BY MOUTH DAILY. Patient taking differently: Take 75 mg by mouth daily. 02/19/21   Burchette, Alinda Sierras, MD  docusate sodium  (STOOL SOFTENER) 100 MG capsule Take 1 capsule (100 mg total) by mouth 2 (two) times daily as needed for mild constipation. Patient taking differently: Take 100 mg by mouth daily. 07/28/19   Ivor Costa, MD  ELIQUIS 2.5 MG TABS tablet TAKE 1 TABLET BY MOUTH TWICE A DAY Patient taking differently: Take 2.5 mg by mouth 2 (two) times daily. 03/09/21   Burchette, Alinda Sierras, MD  FLUoxetine (PROZAC) 10 MG capsule TAKE 1 CAPSULE BY MOUTH EVERY DAY Patient taking differently: Take 10 mg by mouth daily. 11/20/20   Burchette, Alinda Sierras, MD  furosemide (LASIX) 40 MG tablet Take 1 tablet (40 mg total) by mouth in the morning and at bedtime. 09/30/20   Darreld Mclean, PA-C  Homeopathic Products [facility] RELIEF EX) Apply 1 application topically daily as needed (knee pain).    [provider]  hydrocortisone 2.5 % cream Apply 1 application topically daily as needed (facial breakouts).    [provider]  JANUVIA 50 MG tablet TAKE 1 TABLET BY MOUTH EVERY DAY Patient taking differently: Take 50 mg by mouth daily. 03/13/21   Burchette, Alinda Sierras, MD  Multiple Vitamins-Minerals (ICAPS AREDS 2 PO) Take 1 capsule by mouth daily.    [provider]  nitroGLYCERIN (NITROSTAT) 0.4 MG SL tablet Place 1 tablet (0.4 mg total) under the tongue every 5 (five) minutes x 3 doses as needed for chest pain (if no relief after 3rd dose, proceed to the ED for an evaluation). 11/28/20   [name], MD  pantoprazole (PROTONIX) 40 MG tablet TAKE 1 TABLET BY MOUTH EVERY DAY Patient taking differently: Take 40 mg by mouth daily. 01/13/21   Burchette, Alinda Sierras, MD  rosuvastatin (CRESTOR) 20 MG tablet TAKE 1 TABLET BY MOUTH EVERY DAY Patient taking differently: Take 20 mg by mouth daily. 03/09/21   Burchette, Alinda Sierras, MD  spironolactone (ALDACTONE) 25 MG tablet Take 1 tablet (25 mg total) by mouth daily. 03/13/21   [name], MD    Allergies    Codeine and Morphine  Review of Systems   Review of Systems   Constitutional:        Per HPI, otherwise negative  HENT:         Per HPI, otherwise negative  Respiratory:         Per HPI, otherwise negative  Cardiovascular:        Per HPI,  otherwise negative  Gastrointestinal:  Negative for vomiting.  Endocrine:       Negative aside from HPI  Genitourinary:        Neg aside from HPI   Musculoskeletal:        Per HPI, otherwise negative  Skin: Negative.   Neurological:  Negative for syncope.   Physical Exam Updated Vital Signs BP (!) 144/118   Pulse 67   Temp 98.3 F (36.8 C)   Resp (!) 22   SpO2 99%   Physical Exam Vitals and nursing note reviewed.  Constitutional:      General: He is not in acute distress.    Appearance: He is well-developed.  HENT:     Head: Normocephalic and atraumatic.  Eyes:     Conjunctiva/sclera: Conjunctivae normal.  Cardiovascular:     Rate and Rhythm: Normal rate and regular rhythm.  Pulmonary:     Effort: Pulmonary effort is normal. No respiratory distress.     Breath sounds: No stridor.  Abdominal:     General: There is no distension.  Skin:    General: Skin is warm and dry.  Neurological:     Mental Status: He is alert and oriented to person, place, and time.    ED Results / Procedures / Treatments   Labs (all labs ordered are listed, but only abnormal results are displayed) Labs Reviewed  COMPREHENSIVE METABOLIC PANEL - Abnormal; Notable for the following components:      Result Value   Sodium 134 (*)    Glucose, Bld 210 (*)    BUN 27 (*)    Creatinine, Ser 1.80 (*)    Albumin 3.4 (*)    AST 14 (*)    Total Bilirubin 1.5 (*)    GFR, Estimated 36 (*)    All other components within normal limits  BRAIN NATRIURETIC PEPTIDE - Abnormal; Notable for the following components:   B Natriuretic Peptide 695.0 (*)    All other components within normal limits  CBC WITH DIFFERENTIAL/PLATELET - Abnormal; Notable for the following components:   WBC 11.1 (*)    RDW 15.9 (*)    Platelets 122  (*)    Neutro Abs 8.6 (*)    Monocytes Absolute 1.3 (*)    All other components within normal limits  BASIC METABOLIC PANEL - Abnormal; Notable for the following components:   Glucose, Bld 142 (*)    BUN 24 (*)    Creatinine, Ser 1.72 (*)    GFR, Estimated 38 (*)    All other components within normal limits  GLUCOSE, CAPILLARY - Abnormal; Notable for the following components:   Glucose-Capillary 174 (*)    All other components within normal limits  CBG MONITORING, ED - Abnormal; Notable for the following components:   Glucose-Capillary 227 (*)    All other components within normal limits  CBG MONITORING, ED - Abnormal; Notable for the following components:   Glucose-Capillary 237 (*)    All other components within normal limits  TROPONIN I (HIGH SENSITIVITY) - Abnormal; Notable for the following components:   Troponin I (High Sensitivity) 59 (*)    All other components within normal limits  TROPONIN I (HIGH SENSITIVITY) - Abnormal; Notable for the following components:   Troponin I (High Sensitivity) 62 (*)    All other components within normal limits  SARS CORONAVIRUS 2 (TAT 6-24 HRS)  SURGICAL PCR SCREEN  MAGNESIUM  PROTIME-INR  URINALYSIS, ROUTINE W REFLEX MICROSCOPIC  TYPE AND SCREEN  EKG EKG Interpretation  Date/Time:  Monday April 27 2021 13:12:33 EDT Ventricular Rate:  72 PR Interval:    QRS Duration: 162 QT Interval:  416 QTC Calculation: 456 R Axis:   63 Text Interpretation: Atrial fibrillation Left bundle branch block Premature ventricular complexes Abnormal ECG Confirmed by Carmin Muskrat [phone]) on 04/27/2021 1:48:08 PM  Radiology DG Chest 2 View  Result Date: 04/27/2021 CLINICAL DATA:  Severe aortic stenosis, preoperative assessment, history coronary artery disease, CHF, hypertension, former smoker EXAM: CHEST - 2 VIEW COMPARISON:  09/20/2020 FINDINGS: Enlargement of cardiac silhouette post CABG and coronary stenting. Mediastinal contours and pulmonary  vascularity normal. Atherosclerotic calcification aorta. Chronic LEFT basilar atelectasis and scarring, including an area of nodular opacity at the medial LEFT lung base, unchanged since CT exam of 03/26/2021 as well as an earlier CT of 02/20/2019. No acute infiltrate, pleural effusion or pneumothorax. Bones demineralized. IMPRESSION: Enlargement of cardiac silhouette post CABG and coronary stenting. LEFT basilar scarring including an area of chronic nodularity at LEFT lower lobe unchanged since 2020. No acute abnormalities. Aortic Atherosclerosis (ICD10-I70.0). Electronically Signed   By: Lavonia Dana M.D.   On: 04/27/2021 13:05    Procedures Procedures   Medications Ordered in ED Medications  aspirin chewable tablet 324 mg (324 mg Oral Given 04/27/21 1302)    ED Course  I have reviewed the triage vital signs and the nursing notes.  Pertinent labs & imaging results that were available during my care of the patient were reviewed by me and considered in my medical decision making (see chart for details).  On repeat evaluation discussed the patient's presentation with cardiology at bedside.  With consideration of his upcoming valve repair, ongoing episodes of chest pain, though initial findings are reassuring, with normal troponin values that are not appreciably changed from his baseline, though they are technically abnormal, low suspicion for acute ischemic event, but some suspicion for his aortic stenosis contributing to his episodes. Findings otherwise generally reassuring.  After discussion, I spoke with cardiology, patient admitted to their service for monitoring, management, anticipated valve repair, possibly tomorrow.    MDM Rules/Calculators/A&P MDM Number of Diagnoses or Management Options Severe aortic stenosis: established, worsening   Amount and/or Complexity of Data Reviewed Clinical lab tests: ordered and reviewed Tests in the radiology section of CPT: ordered and reviewed Tests  in the medicine section of CPT: reviewed and ordered Decide to obtain previous medical records or to obtain history from someone other than the patient: yes Obtain history from someone other than the patient: yes Review and summarize past medical records: yes Discuss the patient with other providers: yes Independent visualization of images, tracings, or specimens: yes  Risk of Complications, Morbidity, and/or Mortality Presenting problems: high Diagnostic procedures: high Management options: high  Critical Care Total time providing critical care: < 30 minutes  Patient Progress Patient progress: stable   Final Clinical Impression(s) / ED Diagnoses Final diagnoses:  Severe aortic stenosis     Carmin Muskrat, MD 04/28/21 [phone]

## 2021-04-27 NOTE — ED Notes (Signed)
Pt requesting medication to help him rest. Pt states he usually takes Tylenol PM at home and wanting that. Kroeger PA made aware. See new orders.

## 2021-04-27 NOTE — ED Notes (Signed)
Patient transported to X-ray 

## 2021-04-27 NOTE — Progress Notes (Signed)
During PAT appointment, patient  reported having chest pain rated "6" out of 10 and feeling short of breath. He described his pain as dull and aching and stated that he "just felt terrible. He reports having chest pain this morning before his appointment and taking a nitroglycerin tablet which helped some. Spoke with Glynn Octave PA and TAVR nurse Theodosia Quay RN.  Katie PA in that department is off this week . Patient taken to ER for active chest pain per Lauren's recommendation. Patient left in triage.

## 2021-04-27 NOTE — H&P (Addendum)
Cardiology Admission History and Physical:   Patient ID: [name] MRN: [id number]; DOB: [birth year]   Admission date: 04/27/2021  PCP:  [name], MD   [facility] Providers Cardiologist:  [name], MD      Chief Complaint:  chest pain  Patient Profile:   [name] is a 87 y.o. male with a PMH of CAD s/p CABG in 2009 with subsequent balloon angioplasty to SVG-OM in 2015, stented in 2016, and most recently 09/2020, chronic combined CHF, paroxysmal atrial fibrillation, HTN, HLD, DM type 2, GERD, and COPD, who is being seen 04/27/2021 for the evaluation of chest pain.  History of Present Illness:   [name] presented for his preop evaluation 04/27/21 in anticipation of TAVR  04/28/21. While at his visit, he reported 6/10 chest pain and SOB, stating he "just felt terrible". He reported having chest pain prior to his visit this morning, for which he took SL nitro with improvement in symptoms. Given active chest pain he was referred to the ED for further evaluation.   He was last evaluated by cardiology at an outpatient visit with Dr. Angelena Form 02/2021 for ongoing TAVR evaluation, at which time he continued to have some DOE and fatigue but no exertional chest pain. He reports occasional resting sharp chest pain. He was felt to be a good candidate for TAVR and recommended for ongoing work-up including CTA C/A/P, cardiac CT, and carotid dopplers. He was seen by Dr. [name] 04/14/21 and was felt to be a poor candidate for conventional surgical AVR and was scheduled for TAVR 04/28/21.   His last echocardiogram 03/12/21 showed EF 45-50%, moderate concentric LVH, indeterminate LV diastolic function, severe RAE, mild MR, and severe AS with mean gradient 21 mmHg c/w low flow low gradient severe AS. His last ischemic evaluation was a [facility] 09/2020 which showed severe 3 vessel native disease with patent LIMA to LAD, SVG-L posterior branch, SVG-R PDA, with severe ISR in the SVG-OM which was  treated with balloon angioplasty.    At the time of this evaluation he has continued to have some intermittent chest pain. He reports chest pain occurred this morning while sitting down. He had associated SOB and dizziness. He took 2 SL nitro with improvement in symptoms. Chest pain reoccurred while at his preop visit and he was sent to the ER. He has continued to have some discomfort which was improved with aspirin 34m. He has chronic DOE which has gotten slightly worse recently. His last episode of chest pain prior to today was ~1 week ago. He has not taken SL nitro lately. No recent fevers or cold symptoms. No complaints of bleeding. He has been holding his eliquis in anticipation of TAVR - last dose 04/24/21.   Past Medical History:  Diagnosis Date   Aortic stenosis, moderate 09/20/2020   Arthritis    CAD (coronary artery disease)    a. Cath September 2015 LIMA to the LAD patent, SVG to PDA patent, SVG to posterior lateral patent, SVG to OM with a 90% in-stent restenosis at an anastomotic lesion. This was treated with angioplasty. b. cath 04/01/2015 95% ISR in SVG to OM treated with 2.75x24 Synergy DES postdilated to 3.3100m all other grafts patent   Cataract    surgery,B/L   CHF (congestive heart failure) (HCC)    Chronic kidney disease    nephrolithiasis   Diabetes mellitus    TYPE 2   GERD (gastroesophageal reflux disease)    Hernia    Hypercholesterolemia  Hypertension    Incontinence    hx  over 1 year,leaks without awareness   Other and unspecified diseases of appendix    PAF (paroxysmal atrial fibrillation) ([place])    in the setting of ischemia on 03/31/2015   Pancreatitis    Prostate CA ([place]) 03/01/04   prostate bx=Adenocarcinoam,gleason 3+4=7,PSA=6.75   Shortness of breath dyspnea    Ulcer    hx gastric    Past Surgical History:  Procedure Laterality Date   APPENDECTOMY     BACK SURGERY     CARDIAC CATHETERIZATION N/A 04/01/2015   Procedure: Left Heart Cath and  Cors/Grafts Angiography;  Surgeon: Jettie Booze, MD;  Location: [employer] CV LAB;  Service: Cardiovascular;  Laterality: N/A;   CARDIAC CATHETERIZATION N/A 04/01/2015   Procedure: Coronary Stent Intervention;  Surgeon: Jettie Booze, MD;  Location: [place] CV [facility];  Service: Cardiovascular;  Laterality: N/A;   CARDIOVERSION N/A 11/01/2019   Procedure: CARDIOVERSION;  Surgeon: Geralynn Rile, MD;  Location: [facility] ENDOSCOPY;  Service: Cardiovascular;  Laterality: N/A;   CARPAL TUNNEL RELEASE  2004   both hands   CORONARY ARTERY BYPASS GRAFT     CORONARY STENT INTERVENTION N/A 09/23/2020   Procedure: CORONARY STENT INTERVENTION;  Surgeon: Sherren Mocha, MD;  Location: [place] CV LAB;  Service: Cardiovascular;  Laterality: N/A;   CYSTOSCOPY  08/23/12   incomplete emoptying bladder   HERNIA REPAIR     incision and drainage of right chest abscess     LAPAROSCOPIC CHOLECYSTECTOMY  09/2009   LEFT HEART CATHETERIZATION WITH CORONARY ANGIOGRAM N/A 07/19/2014   Procedure: LEFT HEART CATHETERIZATION WITH CORONARY ANGIOGRAM;  Surgeon: Leonie Man, MD;  Location: [facility] CATH LAB;  Service: Cardiovascular;  Laterality: N/A;   RECTAL SURGERY     RIGHT/LEFT HEART CATH AND CORONARY/GRAFT ANGIOGRAPHY N/A 09/23/2020   Procedure: RIGHT/LEFT HEART CATH AND CORONARY/GRAFT ANGIOGRAPHY;  Surgeon: Sherren Mocha, MD;  Location: [place] [facility];  Service: Cardiovascular;  Laterality: N/A;   ROBOT ASSISTED LAPAROSCOPIC RADICAL PROSTATECTOMY  2005     Medications Prior to Admission: Prior to Admission medications   Medication Sig Start Date End Date Taking? Authorizing Provider  acetaminophen (TYLENOL) 500 MG tablet Take 1,000 mg by mouth every 4 (four) hours as needed for headache (pain).    [provider]  albuterol (VENTOLIN HFA) 108 (90 Base) MCG/ACT inhaler Inhale 2 puffs into the lungs every 6 (six) hours as needed. Patient taking differently: Inhale 2 puffs into the lungs  every 6 (six) hours as needed for wheezing or shortness of breath. 08/12/20   Spero Geralds, MD  amLODipine (NORVASC) 5 MG tablet Take 1 tablet (5 mg total) by mouth daily. 07/30/20   Burchette, Alinda Sierras, MD  blood glucose meter kit and supplies KIT Dispense based on patient and insurance preference. Use up to four times daily as directed. (FOR ICD-9 250.00, 250.01). 09/08/20   Guilford Shi, MD  budesonide-formoterol (SYMBICORT) 160-4.5 MCG/ACT inhaler Inhale 2 puffs into the lungs in the morning and at bedtime. Patient not taking: No sig reported 08/07/20   Spero Geralds, MD  Carboxymethylcellul-Glycerin (LUBRICATING EYE DROPS OP) Place 1 drop into both eyes daily as needed (dry eyes).    [provider]  carvedilol (COREG) 6.25 MG tablet TAKE 1 TABLET (6.25 MG TOTAL) BY MOUTH 2 (TWO) TIMES DAILY WITH A MEAL. 08/27/20   [name], MD  clopidogrel (PLAVIX) 75 MG tablet TAKE 1 TABLET (75 MG TOTAL) BY MOUTH DAILY.  Patient taking differently: Take 75 mg by mouth daily. 02/19/21   Burchette, Alinda Sierras, MD  docusate sodium (STOOL SOFTENER) 100 MG capsule Take 1 capsule (100 mg total) by mouth 2 (two) times daily as needed for mild constipation. Patient taking differently: Take 100 mg by mouth daily. 07/28/19   Ivor Costa, MD  ELIQUIS 2.5 MG TABS tablet TAKE 1 TABLET BY MOUTH TWICE A DAY Patient taking differently: Take 2.5 mg by mouth 2 (two) times daily. 03/09/21   Burchette, Alinda Sierras, MD  FLUoxetine (PROZAC) 10 MG capsule TAKE 1 CAPSULE BY MOUTH EVERY DAY Patient taking differently: Take 10 mg by mouth daily. 11/20/20   Burchette, Alinda Sierras, MD  furosemide (LASIX) 40 MG tablet Take 1 tablet (40 mg total) by mouth in the morning and at bedtime. 09/30/20   Darreld Mclean, PA-C  Homeopathic Products [facility] RELIEF EX) Apply 1 application topically daily as needed (knee pain).    [provider]  hydrocortisone 2.5 % cream Apply 1 application topically daily as needed (facial  breakouts).    [provider]  JANUVIA 50 MG tablet TAKE 1 TABLET BY MOUTH EVERY DAY Patient taking differently: Take 50 mg by mouth daily. 03/13/21   Burchette, Alinda Sierras, MD  Multiple Vitamins-Minerals (ICAPS AREDS 2 PO) Take 1 capsule by mouth daily.    [provider]  nitroGLYCERIN (NITROSTAT) 0.4 MG SL tablet Place 1 tablet (0.4 mg total) under the tongue every 5 (five) minutes x 3 doses as needed for chest pain (if no relief after 3rd dose, proceed to the ED for an evaluation). 11/28/20   [name], MD  pantoprazole (PROTONIX) 40 MG tablet TAKE 1 TABLET BY MOUTH EVERY DAY Patient taking differently: Take 40 mg by mouth daily. 01/13/21   Burchette, Alinda Sierras, MD  rosuvastatin (CRESTOR) 20 MG tablet TAKE 1 TABLET BY MOUTH EVERY DAY Patient taking differently: Take 20 mg by mouth daily. 03/09/21   Burchette, Alinda Sierras, MD  spironolactone (ALDACTONE) 25 MG tablet Take 1 tablet (25 mg total) by mouth daily. 03/13/21   [name], MD     Allergies:    Allergies  Allergen Reactions   Codeine Other (See Comments)    Makes him feel goofy   Morphine Other (See Comments)    Nightmares and felt bad    Social History:   Social History   Socioeconomic History   Marital status: Married    Spouse name: Not on file   Number of children: 2   Years of education: Not on file   Highest education level: Not on file  Occupational History    Employer: RETIRED    Comment: Retired  Tobacco Use   Smoking status: Former    Packs/day: 0.50    Years: 5.00    Pack years: 2.50    Types: Cigarettes    Quit date: 07/20/1972    Years since quitting: 48.8   Smokeless tobacco: Never   Tobacco comments:    quit s  Vaping Use   Vaping Use: Never used  Substance and Sexual Activity   Alcohol use: No   Drug use: No    Comment: quit smoking 40 years ago   Sexual activity: Never  Other Topics Concern   Not on file  Social History Narrative   Retired   Married      Social  Determinants of Radio broadcast assistant Strain: Low Risk    Difficulty of Paying Living Expenses: Not hard at  all  Food Insecurity: No Food Insecurity   Worried About Charity fundraiser in the Last Year: Never true   Ran Out of Food in the Last Year: Never true  Transportation Needs: No Transportation Needs   Lack of Transportation (Medical): No   Lack of Transportation (Non-Medical): No  Physical Activity: Insufficiently Active   Days of Exercise per Week: 3 days   Minutes of Exercise per Session: 30 min  Stress: No Stress Concern Present   Feeling of Stress : Not at all  Social Connections: Socially Integrated   Frequency of Communication with Friends and Family: More than three times a week   Frequency of Social Gatherings with Friends and Family: More than three times a week   Attends Religious Services: More than 4 times per year   Active Member of [employer] or Organizations: Yes   Attends Archivist Meetings: 1 to 4 times per year   Marital Status: Married  Human resources officer Violence: Not At Risk   Fear of Current or Ex-Partner: No   Emotionally Abused: No   Physically Abused: No   Sexually Abused: No    Family History:   The patient's family history includes Cancer in his mother; Heart attack in his father.    ROS:  Please see the history of present illness.  All other ROS reviewed and negative.     Physical Exam/Data:   Vitals:   04/27/21 1500 04/27/21 1530 04/27/21 1545 04/27/21 1600  BP: 130/86 (!) 144/75 125/87 (!) 166/93  Pulse: 80 (!) 56 61 80  Resp: 19 (!) 21 20 (!) 21  Temp:      SpO2: 97% 98% 98% 98%   No intake or output data in the 24 hours ending 04/27/21 1628 Last 3 Weights 04/27/2021 04/27/2021 03/26/2021  Weight (lbs) 176 lb 8 oz 167 lb 1.7 oz 167 lb  Weight (kg) 80.06 kg 75.8 kg 75.751 kg     There is no height or weight on file to calculate BMI.  General:  Well nourished, well developed, in no acute distress HEENT: sclera  anicteric Neck: no JVD Endocrine:  No thryomegaly Vascular: No carotid bruits; distal pulses 2+ bilaterally  Cardiac:  normal S1, S2; IRIR; + murmur, no rubs or gallops Lungs:  clear to auscultation bilaterally, no wheezing, rhonchi or rales  Abd: soft, nontender, no hepatomegaly  Ext: trace LE edema Musculoskeletal:  No deformities, BUE and BLE strength normal and equal Skin: warm and dry  Neuro:  CNs 2-12 intact, no focal abnormalities noted Psych:  Normal affect    EKG:  The ECG that was done 04/27/21 was personally reviewed and demonstrates atrial fibrillation with rate 72 bpm, PVCs, chronic LBBB; no change from previous  Relevant CV Studies: Echo November 2021:   1. Left ventricular ejection fraction, by estimation, is 50 to 55%. The  left ventricle has low normal function. The left ventricle demonstrates  regional wall motion abnormalities (see scoring diagram/findings for  description). There is severe left  ventricular hypertrophy. Left ventricular diastolic function could not be  evaluated. There is severe hypokinesis of the left ventricular, basal-mid  inferior wall.   2. Right ventricular systolic function is moderately reduced. The right  ventricular size is normal.   3. Right atrial size was mildly dilated.   4. The mitral valve is abnormal. Trivial mitral valve regurgitation.   5. The aortic valve is calcified. Aortic valve regurgitation is not  visualized. Moderate to severe aortic valve stenosis. Aortic  valve area,  by VTI measures 0.87 cm. Aortic valve mean gradient measures 22.0 mmHg.  Aortic valve Vmax measures 2.99 m/s. DI   of 0.25.   6. Aortic dilatation noted. There is mild dilatation of the ascending  aorta, measuring 40 mm.   7. The inferior vena cava is dilated in size with <50% respiratory  variability, suggesting right atrial pressure of 15 mmHg.   Comparison(s): Changes from prior study are noted. 07/27/2019: LVEF 60-65%,  severe LVH, mild AS -  peak and mean gradients of 15/26 mmHg.  Cardiac cath December 2021:   Balloon angioplasty was performed using a BALLOON SAPPHIRE [place] 3.5X12. Post intervention, there is a 20% residual stenosis.   1.  Severe native three-vessel coronary artery disease 2.  Status post aortocoronary bypass surgery with continued patency of the LIMA to LAD, saphenous vein graft to left posterior lateral branch, and saphenous vein graft to right PDA 3.  Severe in-stent restenosis in the saphenous vein graft to obtuse marginal (2 layers of stent), treated successfully with noncompliant balloon angioplasty (3.5 mm noncompliant balloon to 22 atm) 4.  Moderate aortic stenosis with mean gradient 14 mmHg, calculated aortic valve area 1.37 cm, moderate amount of difficulty crossing the valve with a straight wire, suspect component of paradoxical low flow low gradient aortic stenosis 5.  Normal diastolic filling pressures in the right and left heart   Recommendations: Aspirin x30 days, clopidogrel 75 mg at least 1 month favor 6 months if tolerated, resume apixaban tomorrow  Diagnostic Dominance: Co-dominant      Intervention         Echocardiogram 02/2021: 1. Left ventricular ejection fraction, by estimation, is 45 to 50%. The  left ventricle has mildly decreased function. The left ventricle  demonstrates global hypokinesis. There is moderate concentric left  ventricular hypertrophy. Left ventricular  diastolic function could not be evaluated. Elevated left ventricular  end-diastolic pressure.   2. Right ventricular systolic function is normal. The right ventricular  size is normal. There is normal pulmonary artery systolic pressure.   3. Right atrial size was severely dilated.   4. The mitral valve is normal in structure. Mild mitral valve  regurgitation. No evidence of mitral stenosis.   5. The aortic valve is calcified. There is severe calcifcation of the  aortic valve. There is severe thickening of the  aortic valve. Aortic valve  regurgitation is not visualized. Severe aortic valve stenosis. Aortic  valve area, by VTI measures 0.63 cm.  Aortic valve mean gradient measures 21.0 mmHg. Aortic valve Vmax measures  2.72 m/s.   6. Aortic dilatation noted. There is mild dilatation of the ascending  aorta, measuring 40 mm.   7. The inferior vena cava is normal in size with greater than 50%  respiratory variability, suggesting right atrial pressure of 3 mmHg.   8. Compared to echo 08/2020, the dimensionless index has decreased  further from 0.25 to 0.20. The mean AVG is unchanged. This is consistent  with low flow low gradient HFrEF severe AS.    Laboratory Data:  High Sensitivity Troponin:   Recent Labs  Lab 04/27/21 1305 04/27/21 1425  TROPONINIHS 59* 62*      Chemistry Recent Labs  Lab 04/27/21 1305  NA 134*  K 3.7  CL 100  CO2 24  GLUCOSE 210*  BUN 27*  CREATININE 1.80*  CALCIUM 9.5  GFRNONAA 36*  ANIONGAP 10    Recent Labs  Lab 04/27/21 1305  PROT 6.8  ALBUMIN 3.4*  AST 14*  ALT 19  ALKPHOS 97  BILITOT 1.5*   Hematology Recent Labs  Lab 04/27/21 1305  WBC 11.1*  RBC 4.62  HGB 13.1  HCT 39.1  MCV 84.6  MCH 28.4  MCHC 33.5  RDW 15.9*  PLT 122*   BNP Recent Labs  Lab 04/27/21 1305  BNP 695.0*    DDimer No results for input(s): DDIMER in the last 168 hours.   Radiology/Studies:  DG Chest 2 View  Result Date: 04/27/2021 CLINICAL DATA:  Severe aortic stenosis, preoperative assessment, history coronary artery disease, CHF, hypertension, former smoker EXAM: CHEST - 2 VIEW COMPARISON:  09/20/2020 FINDINGS: Enlargement of cardiac silhouette post CABG and coronary stenting. Mediastinal contours and pulmonary vascularity normal. Atherosclerotic calcification aorta. Chronic LEFT basilar atelectasis and scarring, including an area of nodular opacity at the medial LEFT lung base, unchanged since CT exam of 03/26/2021 as well as an earlier CT of 02/20/2019. No  acute infiltrate, pleural effusion or pneumothorax. Bones demineralized. IMPRESSION: Enlargement of cardiac silhouette post CABG and coronary stenting. LEFT basilar scarring including an area of chronic nodularity at LEFT lower lobe unchanged since 2020. No acute abnormalities. Aortic Atherosclerosis (ICD10-I70.0). Electronically Signed   By: Lavonia Dana M.D.   On: 04/27/2021 13:05     Assessment and Plan:   1. Chest pain in patient with severe AS and CAD s/p CABG with : scheduled for TAVR 04/28/21. Reported chest pain at his preop visit today. EKG with atrial fibrillation with chronic LBBB. HsTrop 59>62. BNP 695, comparable to 09/2020. CXR showed no acute findings. Not on aspirin given need for anticoagulation. Trop trend not c/w ACS. He does have chest wall TTP. Symptoms could be related to his severe AS.  - No further ischemic evaluation at this time - Will keep NPO after MN in anticipation of TAVR 04/28/21 - Continue plavix - Continue carvedilol - Continue prn SL nitro  2. Chronic combined CHF: EF 45-50% on echo 02/2021. BNP elevated to 695 which is essentially unchanged from 09/2020. CXR showed no acute findings. Lungs are clear on exam.  - Continue carvedilol - Will hold lasix in anticipation of TAVR tomorrow  3. Paroxysmal atrial fibrillation: suspect more persistent Afib as all EKGs dating from 01/2020 have been in atrial fibrillation. Rates are stable.  - Will continue to hold eliquis in anticipation of TAVR 04/28/21  4. HTN: BP generally stable - Continue amlodipine and carvedilol  5. HLD: LDL 46 12/2020 - Continue crestor  6. DM type 2: A1C 7.7 12/2020; goal <7 - Will order ISS this admission  7. COPD:  - Continue home Symbicort    Risk Assessment/Risk Scores:  { HEAR Score (for undifferentiated chest pain):  HEAR Score: 5    CHA2DS2-VASc Score = 5  This indicates a 7.2% annual risk of stroke. The patient's score is based upon: CHF History: Yes HTN History: Yes Diabetes  History: Yes Stroke History: No Vascular Disease History: No Age Score: 2 Gender Score: 0  Severity of Illness: The appropriate patient status for this patient is INPATIENT. Inpatient status is judged to be reasonable and necessary in order to provide the required intensity of service to ensure the patient's safety. The patient's presenting symptoms, physical exam findings, and initial radiographic and laboratory data in the context of their chronic comorbidities is felt to place them at high risk for further clinical deterioration. Furthermore, it is not anticipated that the patient will be medically stable for discharge from the hospital within 2 midnights  of admission. The following factors support the patient status of inpatient.   " The patient's presenting symptoms include chest pain. " The worrisome physical exam findings include murmur on cardiac exam. " The initial radiographic and laboratory data are worrisome because of mildly elevated HsTrop and BNP. " The chronic co-morbidities include CAD s/p CABG, severe AS anticipating TAVR 04/28/21, paroxysmal atrial fibrillation, HTN, HLD, DM type 2, and COPD.   * I certify that at the point of admission it is my clinical judgment that the patient will require inpatient hospital care spanning beyond 2 midnights from the point of admission due to high intensity of service, high risk for further deterioration and high frequency of surveillance required.*   For questions or updates, please contact [place] Please consult [email or web address] for contact info under     Signed, Abigail Butts, PA-C  04/27/2021 4:28 PM   I have personally seen and examined this patient. I agree with the assessment and plan as outlined above.  Mr. Dahlstrom is well known to our service. He is known to have CAD with prior CABG and severe AS. Last PCI in December 2021. We are planning for TAVR tomorrow. He had chest pain this am when here for his pre-op testing. He was  given SL NTG. Troponin is mildly abnormal but his troponin is always mildly elevated.  EKG with atrial fib and LBBB-personally reviewed by me.  His chest pain is resolved. He thinks his chest wall feels sore now.   Labs reviewed.   My exam shows an elderly male in NAD. ZY:YQMGN irreg Pulm: Lungs clear Ext: no edema  Plan: Will admit to telemetry. Will proceed with TAVR tomorrow.   Lauree Chandler 04/27/2021 4:55 PM

## 2021-04-27 NOTE — ED Notes (Signed)
Received a call from blood bank saying there was an order placed for a type and screen when he was supposed to have the procedure done today. Due to pt having chest pain, pt is being admitted to the hospital and having the procedure tomorrow. Blood bank asked if there could be an order for the type and screen to be drawn now so they will have it for tomorrow. This RN will message MD to confirm.

## 2021-04-27 NOTE — ED Triage Notes (Signed)
Pt reports having episode of chest pain this am with sob and nausea. Pt took a nitro that helped relieve the pain. Pt was coming for preop appt today for valve replacement, sent here due to having chest pain. No acute distress is noted at triage.

## 2021-04-28 ENCOUNTER — Inpatient Hospital Stay (HOSPITAL_COMMUNITY): Payer: Medicare PPO

## 2021-04-28 ENCOUNTER — Encounter (HOSPITAL_COMMUNITY): Payer: Self-pay | Admitting: Cardiovascular Disease

## 2021-04-28 ENCOUNTER — Inpatient Hospital Stay (HOSPITAL_COMMUNITY): Admission: RE | Admit: 2021-04-28 | Payer: Medicare PPO | Source: Home / Self Care | Admitting: Cardiovascular Disease

## 2021-04-28 ENCOUNTER — Inpatient Hospital Stay (HOSPITAL_COMMUNITY): Payer: Medicare PPO | Admitting: Certified Registered Nurse Anesthetist

## 2021-04-28 ENCOUNTER — Other Ambulatory Visit: Payer: Self-pay

## 2021-04-28 ENCOUNTER — Inpatient Hospital Stay (HOSPITAL_COMMUNITY): Payer: Medicare PPO | Admitting: Vascular Surgery

## 2021-04-28 ENCOUNTER — Encounter (HOSPITAL_COMMUNITY)
Admission: EM | Disposition: A | Discharge status: Home / Self Care | Payer: Self-pay | Source: Home / Self Care | Attending: Cardiovascular Disease

## 2021-04-28 DIAGNOSIS — I350 Nonrheumatic aortic (valve) stenosis: Secondary | ICD-10-CM

## 2021-04-28 DIAGNOSIS — Z952 Presence of prosthetic heart valve: Secondary | ICD-10-CM

## 2021-04-28 DIAGNOSIS — Z006 Encounter for examination for normal comparison and control in clinical research program: Secondary | ICD-10-CM

## 2021-04-28 HISTORY — PX: ULTRASOUND GUIDANCE FOR VASCULAR ACCESS: SHX6516

## 2021-04-28 HISTORY — PX: TRANSCATHETER AORTIC VALVE REPLACEMENT, TRANSFEMORAL: SHX6400

## 2021-04-28 HISTORY — PX: INTRAOPERATIVE TRANSTHORACIC ECHOCARDIOGRAM: SHX6523

## 2021-04-28 LAB — POCT I-STAT, CHEM 8
BUN: 21 mg/dL (ref 8–23)
BUN: 22 mg/dL (ref 8–23)
Calcium, Ion: 1.36 mmol/L (ref 1.15–1.40)
Calcium, Ion: 1.33 mmol/L (ref 1.15–1.40)
Chloride: 102 mmol/L (ref 98–111)
Chloride: 102 mmol/L (ref 98–111)
Creatinine, Ser: 1.5 mg/dL — ABNORMAL HIGH (ref 0.61–1.24)
Creatinine, Ser: 1.5 mg/dL — ABNORMAL HIGH (ref 0.61–1.24)
Glucose, Bld: 155 mg/dL — ABNORMAL HIGH (ref 70–99)
Glucose, Bld: 167 mg/dL — ABNORMAL HIGH (ref 70–99)
HCT: 36 % — ABNORMAL LOW (ref 39.0–52.0)
HCT: 35 % — ABNORMAL LOW (ref 39.0–52.0)
Hemoglobin: 12.2 g/dL — ABNORMAL LOW (ref 13.0–17.0)
Hemoglobin: 11.9 g/dL — ABNORMAL LOW (ref 13.0–17.0)
Potassium: 4.1 mmol/L (ref 3.5–5.1)
Potassium: 3.9 mmol/L (ref 3.5–5.1)
Sodium: 137 mmol/L (ref 135–145)
Sodium: 137 mmol/L (ref 135–145)
TCO2: 27 mmol/L (ref 22–32)
TCO2: 23 mmol/L (ref 22–32)

## 2021-04-28 LAB — URINALYSIS, ROUTINE W REFLEX MICROSCOPIC
Bilirubin Urine: NEGATIVE
Glucose, UA: NEGATIVE mg/dL
Hgb urine dipstick: NEGATIVE
Ketones, ur: NEGATIVE mg/dL
Leukocytes,Ua: NEGATIVE
Nitrite: NEGATIVE
Protein, ur: NEGATIVE mg/dL
Specific Gravity, Urine: 1.014 (ref 1.005–1.030)
pH: 6 (ref 5.0–8.0)

## 2021-04-28 LAB — ECHOCARDIOGRAM LIMITED
AR max vel: 2.65 cm2
AV Area VTI: 2.69 cm2
AV Area mean vel: 2.77 cm2
AV Mean grad: 3 mmHg
AV Peak grad: 5.9 mmHg
Ao pk vel: 1.21 m/s

## 2021-04-28 LAB — GLUCOSE, CAPILLARY
Glucose-Capillary: 174 mg/dL — ABNORMAL HIGH (ref 70–99)
Glucose-Capillary: 152 mg/dL — ABNORMAL HIGH (ref 70–99)
Glucose-Capillary: 175 mg/dL — ABNORMAL HIGH (ref 70–99)
Glucose-Capillary: 157 mg/dL — ABNORMAL HIGH (ref 70–99)

## 2021-04-28 LAB — BASIC METABOLIC PANEL
Anion gap: 7 (ref 5–15)
BUN: 24 mg/dL — ABNORMAL HIGH (ref 8–23)
CO2: 26 mmol/L (ref 22–32)
Calcium: 9.5 mg/dL (ref 8.9–10.3)
Chloride: 102 mmol/L (ref 98–111)
Creatinine, Ser: 1.72 mg/dL — ABNORMAL HIGH (ref 0.61–1.24)
GFR, Estimated: 38 mL/min — ABNORMAL LOW (ref >60)
Glucose, Bld: 142 mg/dL — ABNORMAL HIGH (ref 70–99)
Potassium: 3.7 mmol/L (ref 3.5–5.1)
Sodium: 135 mmol/L (ref 135–145)

## 2021-04-28 LAB — SURGICAL PCR SCREEN
MRSA, PCR: NEGATIVE
Staphylococcus aureus: NEGATIVE

## 2021-04-28 LAB — SARS CORONAVIRUS 2 (TAT 6-24 HRS): SARS Coronavirus 2: NEGATIVE

## 2021-04-28 SURGERY — IMPLANTATION, AORTIC VALVE, TRANSCATHETER, FEMORAL APPROACH
Anesthesia: Monitor Anesthesia Care | Site: Groin | Laterality: Left

## 2021-04-28 MED ORDER — HEPARIN SODIUM (PORCINE) 1000 UNIT/ML IJ SOLN
INTRAMUSCULAR | Status: DC | PRN
  Administered 2021-04-28: 12000 [IU] via INTRAVENOUS

## 2021-04-28 MED ORDER — CLOPIDOGREL BISULFATE 75 MG PO TABS
75.0000 mg | ORAL_TABLET | Freq: Every day | ORAL | Status: DC
  Administered 2021-04-29: 75 mg via ORAL
  Filled 2021-04-28: qty 1

## 2021-04-28 MED ORDER — SODIUM CHLORIDE 0.9 % IV SOLN: 250.0000 mL | INTRAVENOUS | Status: DC | PRN

## 2021-04-28 MED ORDER — LIDOCAINE HCL 1 % IJ SOLN
INTRAMUSCULAR | Status: DC | PRN
  Administered 2021-04-28: 10 mL

## 2021-04-28 MED ORDER — LACTATED RINGERS IV SOLN: INTRAVENOUS | Status: DC | PRN

## 2021-04-28 MED ORDER — ACETAMINOPHEN 650 MG RE SUPP: 650.0000 mg | Freq: Four times a day (QID) | RECTAL | Status: DC | PRN

## 2021-04-28 MED ORDER — CHLORHEXIDINE GLUCONATE 0.12 % MT SOLN
15.0000 mL | Freq: Once | OROMUCOSAL | Status: AC
  Administered 2021-04-28: 15 mL via OROMUCOSAL
  Filled 2021-04-28: qty 15

## 2021-04-28 MED ORDER — SODIUM CHLORIDE 0.9 % IV SOLN: INTRAVENOUS | Status: AC

## 2021-04-28 MED ORDER — SODIUM CHLORIDE 0.9% FLUSH: 3.0000 mL | INTRAVENOUS | Status: DC | PRN

## 2021-04-28 MED ORDER — IODIXANOL 320 MG/ML IV SOLN
INTRAVENOUS | Status: DC | PRN
  Administered 2021-04-28: 14 mL via INTRA_ARTERIAL

## 2021-04-28 MED ORDER — ONDANSETRON HCL 4 MG/2ML IJ SOLN: 4.0000 mg | Freq: Four times a day (QID) | INTRAMUSCULAR | Status: DC | PRN

## 2021-04-28 MED ORDER — POTASSIUM CHLORIDE 2 MEQ/ML IV SOLN
80.0000 meq | INTRAVENOUS | Status: DC
  Filled 2021-04-28: qty 40

## 2021-04-28 MED ORDER — SODIUM CHLORIDE 0.9% FLUSH
3.0000 mL | Freq: Two times a day (BID) | INTRAVENOUS | Status: DC
  Administered 2021-04-28: 3 mL via INTRAVENOUS

## 2021-04-28 MED ORDER — PROTAMINE SULFATE 10 MG/ML IV SOLN
INTRAVENOUS | Status: DC | PRN
  Administered 2021-04-28: 120 mg via INTRAVENOUS

## 2021-04-28 MED ORDER — MUPIROCIN 2 % EX OINT: 1.0000 "application " | TOPICAL_OINTMENT | Freq: Two times a day (BID) | CUTANEOUS | Status: DC

## 2021-04-28 MED ORDER — CEFAZOLIN SODIUM-DEXTROSE 2-4 GM/100ML-% IV SOLN
2.0000 g | Freq: Three times a day (TID) | INTRAVENOUS | Status: AC
  Administered 2021-04-28 – 2021-04-29 (×2): 2 g via INTRAVENOUS
  Filled 2021-04-28 (×2): qty 100

## 2021-04-28 MED ORDER — OXYCODONE HCL 5 MG PO TABS: 5.0000 mg | ORAL_TABLET | ORAL | Status: DC | PRN

## 2021-04-28 MED ORDER — MORPHINE SULFATE (PF) 2 MG/ML IV SOLN: 1.0000 mg | INTRAVENOUS | Status: DC | PRN

## 2021-04-28 MED ORDER — CHLORHEXIDINE GLUCONATE 4 % EX LIQD: 60.0000 mL | Freq: Once | CUTANEOUS | Status: DC

## 2021-04-28 MED ORDER — ASPIRIN 81 MG PO CHEW
81.0000 mg | CHEWABLE_TABLET | Freq: Every day | ORAL | Status: DC
  Administered 2021-04-29: 81 mg via ORAL
  Filled 2021-04-28: qty 1

## 2021-04-28 MED ORDER — HEPARIN 6000 UNIT IRRIGATION SOLUTION
Status: DC | PRN
  Administered 2021-04-28 (×2): 1

## 2021-04-28 MED ORDER — DEXMEDETOMIDINE HCL IN NACL 400 MCG/100ML IV SOLN
0.1000 ug/kg/h | INTRAVENOUS | Status: AC
  Administered 2021-04-28: .4 ug/kg/h via INTRAVENOUS
  Administered 2021-04-28: 38.8 ug via INTRAVENOUS
  Filled 2021-04-28: qty 100

## 2021-04-28 MED ORDER — SODIUM CHLORIDE 0.9 % IV SOLN: 250.0000 mL | INTRAVENOUS | Status: DC

## 2021-04-28 MED ORDER — SODIUM CHLORIDE 0.9 % IV SOLN: INTRAVENOUS | Status: DC

## 2021-04-28 MED ORDER — CHLORHEXIDINE GLUCONATE 4 % EX LIQD
60.0000 mL | Freq: Once | CUTANEOUS | Status: DC
  Filled 2021-04-28: qty 60

## 2021-04-28 MED ORDER — ACETAMINOPHEN 325 MG PO TABS
650.0000 mg | ORAL_TABLET | Freq: Four times a day (QID) | ORAL | Status: DC | PRN
  Administered 2021-04-28: 650 mg via ORAL
  Filled 2021-04-28: qty 2

## 2021-04-28 MED ORDER — TRAMADOL HCL 50 MG PO TABS: 50.0000 mg | ORAL_TABLET | Freq: Four times a day (QID) | ORAL | Status: DC | PRN

## 2021-04-28 MED ORDER — NOREPINEPHRINE 4 MG/250ML-% IV SOLN
0.0000 ug/min | INTRAVENOUS | Status: AC
  Administered 2021-04-28: 2 ug/min via INTRAVENOUS
  Filled 2021-04-28: qty 250

## 2021-04-28 MED ORDER — PROPOFOL 500 MG/50ML IV EMUL
INTRAVENOUS | Status: DC | PRN
  Administered 2021-04-28: 10 ug/kg/min via INTRAVENOUS

## 2021-04-28 MED ORDER — METOPROLOL TARTRATE 5 MG/5ML IV SOLN: 2.5000 mg | INTRAVENOUS | Status: DC | PRN

## 2021-04-28 MED ORDER — PROPOFOL 10 MG/ML IV BOLUS
INTRAVENOUS | Status: DC | PRN
  Administered 2021-04-28: 15 mg via INTRAVENOUS
  Administered 2021-04-28: 40 mg via INTRAVENOUS

## 2021-04-28 MED ORDER — CHLORHEXIDINE GLUCONATE 4 % EX LIQD
30.0000 mL | Freq: Once | CUTANEOUS | Status: AC
  Administered 2021-04-28: 2 via TOPICAL

## 2021-04-28 MED ORDER — PHENYLEPHRINE HCL-NACL 10-0.9 MG/250ML-% IV SOLN
0.0000 ug/min | INTRAVENOUS | Status: DC
Start: 2021-04-28 — End: 2021-04-29
  Filled 2021-04-28: qty 250

## 2021-04-28 MED ORDER — FENTANYL CITRATE (PF) 250 MCG/5ML IJ SOLN
INTRAMUSCULAR | Status: DC | PRN
  Administered 2021-04-28 (×2): 25 ug via INTRAVENOUS

## 2021-04-28 MED ORDER — 0.9 % SODIUM CHLORIDE (POUR BTL) OPTIME
TOPICAL | Status: DC | PRN
  Administered 2021-04-28 (×2): 1000 mL

## 2021-04-28 MED ORDER — NITROGLYCERIN IN D5W 200-5 MCG/ML-% IV SOLN: 0.0000 ug/min | INTRAVENOUS | Status: DC

## 2021-04-28 MED ORDER — SODIUM CHLORIDE 0.9 % IV SOLN
INTRAVENOUS | Status: DC
  Filled 2021-04-28: qty 30

## 2021-04-28 MED ORDER — CEFAZOLIN SODIUM-DEXTROSE 2-4 GM/100ML-% IV SOLN
2.0000 g | INTRAVENOUS | Status: AC
  Administered 2021-04-28 (×2): 2 g via INTRAVENOUS

## 2021-04-28 MED ORDER — MAGNESIUM SULFATE 50 % IJ SOLN
40.0000 meq | INTRAMUSCULAR | Status: DC
  Filled 2021-04-28: qty 9.85

## 2021-04-28 SURGICAL SUPPLY — 95 items
ADH SKN CLS APL DERMABOND .7 (GAUZE/BANDAGES/DRESSINGS) ×3
APL PRP STRL LF DISP 70% ISPRP (MISCELLANEOUS) ×3
BAG COUNTER SPONGE SURGICOUNT (BAG) ×4 IMPLANT
BAG DECANTER FOR FLEXI CONT (MISCELLANEOUS) IMPLANT
BAG SNAP BAND KOVER 36X36 (MISCELLANEOUS) ×4 IMPLANT
BAG SPNG CNTER NS LX DISP (BAG) ×3
BLADE CLIPPER SURG (BLADE) IMPLANT
BLADE OSCILLATING /SAGITTAL (BLADE) IMPLANT
BLADE STERNUM SYSTEM 6 (BLADE) IMPLANT
BLADE SURG 10 STRL SS (BLADE) IMPLANT
CABLE ADAPT CONN TEMP 6FT (ADAPTER) ×4 IMPLANT
CATH DIAG 6FR PIGTAIL (CATHETERS) ×2 IMPLANT
CATH DIAG EXPO 6F AL1 (CATHETERS) ×2 IMPLANT
CATH DIAG EXPO 6F VENT PIG 145 (CATHETERS) ×6 IMPLANT
CATH EXTERNAL FEMALE PUREWICK (CATHETERS) IMPLANT
CATH INFINITI 6F AL2 (CATHETERS) ×2 IMPLANT
CATH S G BIP PACING (CATHETERS) ×4 IMPLANT
CHLORAPREP W/TINT 26 (MISCELLANEOUS) ×4 IMPLANT
CLIP VESOCCLUDE MED 24/CT (CLIP) IMPLANT
CLIP VESOCCLUDE SM WIDE 24/CT (CLIP) IMPLANT
CLOSURE MYNX CONTROL 6F/7F (Vascular Products) ×2 IMPLANT
CNTNR URN SCR LID CUP LEK RST (MISCELLANEOUS) ×6 IMPLANT
CONT SPEC 4OZ STRL OR WHT (MISCELLANEOUS) ×8
COVER BACK TABLE 80X110 HD (DRAPES) ×4 IMPLANT
DECANTER SPIKE VIAL GLASS SM (MISCELLANEOUS) ×4 IMPLANT
DERMABOND ADVANCED (GAUZE/BANDAGES/DRESSINGS) ×1
DERMABOND ADVANCED .7 DNX12 (GAUZE/BANDAGES/DRESSINGS) ×3 IMPLANT
DEVICE CLOSURE PERCLS PRGLD 6F (VASCULAR PRODUCTS) ×6 IMPLANT
DRAPE INCISE IOBAN 66X45 STRL (DRAPES) IMPLANT
DRSG TEGADERM 4X4.75 (GAUZE/BANDAGES/DRESSINGS) ×8 IMPLANT
DRYSEAL FLEXSHEATH 18FR 33CM (SHEATH) ×1
ELECT CAUTERY BLADE 6.4 (BLADE) IMPLANT
ELECT REM PT RETURN 9FT ADLT (ELECTROSURGICAL) ×8
ELECTRODE REM PT RTRN 9FT ADLT (ELECTROSURGICAL) ×6 IMPLANT
FELT TEFLON 6X6 (MISCELLANEOUS) IMPLANT
GAUZE SPONGE 4X4 12PLY STRL (GAUZE/BANDAGES/DRESSINGS) ×4 IMPLANT
GLOVE EUDERMIC 7 POWDERFREE (GLOVE) IMPLANT
GLOVE SURG ENC MOIS LTX SZ7.5 (GLOVE) ×4 IMPLANT
GLOVE SURG ENC MOIS LTX SZ8 (GLOVE) IMPLANT
GLOVE SURG ORTHO LTX SZ7.5 (GLOVE) IMPLANT
GOWN STRL REUS W/ TWL LRG LVL3 (GOWN DISPOSABLE) IMPLANT
GOWN STRL REUS W/ TWL XL LVL3 (GOWN DISPOSABLE) ×3 IMPLANT
GOWN STRL REUS W/TWL LRG LVL3 (GOWN DISPOSABLE)
GOWN STRL REUS W/TWL XL LVL3 (GOWN DISPOSABLE) ×4
GUIDEWIRE SAFE TJ AMPLATZ EXST (WIRE) ×4 IMPLANT
INSERT FOGARTY SM (MISCELLANEOUS) IMPLANT
KIT BASIN OR (CUSTOM PROCEDURE TRAY) ×4 IMPLANT
KIT HEART LEFT (KITS) ×4 IMPLANT
KIT SUCTION CATH 14FR (SUCTIONS) IMPLANT
KIT TURNOVER KIT B (KITS) ×4 IMPLANT
LOOP VESSEL MAXI BLUE (MISCELLANEOUS) IMPLANT
LOOP VESSEL MINI RED (MISCELLANEOUS) IMPLANT
NS IRRIG 1000ML POUR BTL (IV SOLUTION) ×4 IMPLANT
PACK ENDO MINOR (CUSTOM PROCEDURE TRAY) ×4 IMPLANT
PAD ARMBOARD 7.5X6 YLW CONV (MISCELLANEOUS) ×8 IMPLANT
PAD ELECT DEFIB RADIOL ZOLL (MISCELLANEOUS) ×4 IMPLANT
PENCIL BUTTON HOLSTER BLD 10FT (ELECTRODE) IMPLANT
PERCLOSE PROGLIDE 6F (VASCULAR PRODUCTS) ×8
POSITIONER HEAD DONUT 9IN (MISCELLANEOUS) ×4 IMPLANT
SET MICROPUNCTURE 5F STIFF (MISCELLANEOUS) ×4 IMPLANT
SHEATH BRITE TIP 7FR 35CM (SHEATH) ×4 IMPLANT
SHEATH DRYSEAL FLEX 18FR 33CM (SHEATH) ×1 IMPLANT
SHEATH PINNACLE 6F 10CM (SHEATH) ×4 IMPLANT
SHEATH PINNACLE 8F 10CM (SHEATH) ×4 IMPLANT
SLEEVE REPOSITIONING LENGTH 30 (MISCELLANEOUS) ×4 IMPLANT
STOPCOCK MORSE 400PSI 3WAY (MISCELLANEOUS) ×8 IMPLANT
SUT ETHIBOND X763 2 0 SH 1 (SUTURE) IMPLANT
SUT GORETEX CV 4 TH 22 36 (SUTURE) IMPLANT
SUT GORETEX CV4 TH-18 (SUTURE) IMPLANT
SUT MNCRL AB 3-0 PS2 18 (SUTURE) IMPLANT
SUT PROLENE 5 0 C 1 36 (SUTURE) IMPLANT
SUT PROLENE 6 0 C 1 30 (SUTURE) IMPLANT
SUT SILK  1 MH (SUTURE) ×4
SUT SILK 1 MH (SUTURE) ×3 IMPLANT
SUT VIC AB 2-0 CT1 27 (SUTURE)
SUT VIC AB 2-0 CT1 TAPERPNT 27 (SUTURE) IMPLANT
SUT VIC AB 2-0 CTX 36 (SUTURE) IMPLANT
SUT VIC AB 3-0 SH 8-18 (SUTURE) IMPLANT
SYR 50ML LL SCALE MARK (SYRINGE) ×4 IMPLANT
SYR BULB IRRIG 60ML STRL (SYRINGE) IMPLANT
SYR MEDRAD MARK V 150ML (SYRINGE) ×4 IMPLANT
SYS DEL EVOLUT PROPLS 23 26 29 (CATHETERS) ×4
SYS LOAD EVOLT PROPLS 23 26 29 (CATHETERS) ×4
SYSTEM DEL EVLT PRPLS 23 26 29 (CATHETERS) ×1 IMPLANT
SYSTEM LOAD EVLT PRPLS23 26 29 (CATHETERS) ×1 IMPLANT
TOWEL GREEN STERILE (TOWEL DISPOSABLE) ×8 IMPLANT
TRANSDUCER W/STOPCOCK (MISCELLANEOUS) ×8 IMPLANT
TRAY FOLEY SLVR 16FR TEMP STAT (SET/KITS/TRAYS/PACK) IMPLANT
TUBING ART PRESS 72  MALE/FEM (TUBING) ×4
TUBING ART PRESS 72 MALE/FEM (TUBING) ×1 IMPLANT
VALVE AORTIC EVOLUT PROPLUS 29 (Valve) ×2 IMPLANT
WIRE AMPLATZ SS-J .035X180CM (WIRE) ×2 IMPLANT
WIRE EMERALD 3MM-J .035X150CM (WIRE) ×4 IMPLANT
WIRE EMERALD 3MM-J .035X260CM (WIRE) ×2 IMPLANT
WIRE ROSEN-J .035X260CM (WIRE) ×2 IMPLANT

## 2021-04-28 NOTE — Transfer of Care (Signed)
Immediate Anesthesia Transfer of Care Note  Patient: [name]  Procedure(s) Performed: TRANSCATHETER AORTIC VALVE REPLACEMENT, TRANSFEMORAL (Bilateral: Groin) ULTRASOUND GUIDANCE FOR VASCULAR ACCESS (Bilateral: Groin) INTRAOPERATIVE TRANSTHORACIC ECHOCARDIOGRAM (Left: Chest)  Patient Location: PACU and Cath Lab  Anesthesia Type:MAC  Level of Consciousness: drowsy  Airway & Oxygen Therapy: Patient Spontanous Breathing  Post-op Assessment: Report given to RN and Post -op Vital signs reviewed and stable  Post vital signs: Reviewed and stable  Last Vitals:  Vitals Value Taken Time  BP 129/55 04/28/21 1258  Temp 36.4 C 04/28/21 1300  Pulse 59 04/28/21 1302  Resp 16 04/28/21 1302  SpO2 93 % 04/28/21 1302  Vitals shown include unvalidated device data.  Last Pain:  Vitals:   04/28/21 1300  TempSrc: Temporal  PainSc: Asleep         Complications: No notable events documented.

## 2021-04-28 NOTE — Op Note (Signed)
HEART AND VASCULAR CENTER   MULTIDISCIPLINARY HEART VALVE TEAM   TAVR OPERATIVE NOTE   Date of Procedure:  04/28/2021  Preoperative Diagnosis: Severe Aortic Stenosis   Postoperative Diagnosis: Same   Procedure:   Transcatheter Aortic Valve Replacement - Percutaneous Right Transfemoral Approach  Medtronic CoreValve Evolut Pro Plus (size 29 mm, serial # [id number])   Co-Surgeons:  Valentina Gu. [name], MD and Lauree Chandler, MD  Anesthesiologist:  Myrtie Soman, MD  Echocardiographer:  Jenkins Rouge, MD  Pre-operative Echo Findings: Severe aortic stenosis Low normal left ventricular systolic function  Post-operative Echo Findings: Trivial paravalvular leak Unchanged left ventricular systolic function   BRIEF CLINICAL NOTE AND INDICATIONS FOR SURGERY  Patient is an 87 year old male with coronary artery disease status post coronary artery bypass grafting in the remote past, multiple subsequent PCI and stent procedures, aortic stenosis, atrial fibrillation on long-term anticoagulation using Eliquis, chronic combined systolic and diastolic congestive heart failure, hypertension, type 2 diabetes mellitus, GE reflux disease, chronic kidney disease, and arthritis who has been referred for surgical consultation to discuss treatment options for management of aortic stenosis.  Patient's cardiac history dates back to 2008 when he underwent multivessel coronary artery bypass grafting by Dr. Prescott Gum.  He has been followed intermittently ever since by Dr. Percival Spanish.  He has undergone multiple previous stent procedures for vein graft disease most recently in December 2021 when he underwent PCI of saphenous vein graft to the obtuse marginal branch of the left circumflex coronary artery.  Catheterization at that time revealed that all of the other bypass grafts remained widely patent and free of significant disease.  Patient has had aortic stenosis that has gradually progressed in severity.  Recent  follow-up echocardiogram performed Mar 12, 2021 revealed further progression of disease.  Left ventricular ejection fraction was estimated 45 to 50%.  The aortic valve is calcified with severe calcification, thickening, and restricted leaflet mobility involving all 3 leaflets.  Peak velocity across the aortic valve measured 2.7 m/s corresponding to mean transvalvular gradient estimated 21 mmHg but aortic valve area calculated only 0.63 cm with DVI notably quite low at 0.20 and stroke-volume index only 21.  The patient was referred to the multidisciplinary heart valve clinic and has been evaluated previously by Dr. Angelena Form.  CT angiography was performed and the patient referred for surgery.   During the course of the patient's preoperative work up they have been evaluated comprehensively by a multidisciplinary team of specialists coordinated through the [facility] in the [place] [facility].  They have been demonstrated to suffer from symptomatic severe aortic stenosis as noted above. The patient has been counseled extensively as to the relative risks and benefits of all options for the treatment of severe aortic stenosis including long term medical therapy, conventional surgery for aortic valve replacement, and transcatheter aortic valve replacement.  All questions have been answered, and the patient provides full informed consent for the operation as described.   DETAILS OF THE OPERATIVE PROCEDURE  PREPARATION:    The patient is brought to the operating room on the above mentioned date and central monitoring was established by the anesthesia team including placement of a central venous line and radial arterial line. The patient is placed in the supine position on the operating table.  Intravenous antibiotics are administered. The patient is monitored closely throughout the procedure under conscious sedation.  Baseline transthoracic echocardiogram was  performed. The patient's chest, abdomen, both groins, and both lower extremities are prepared  and draped in a sterile manner. A time out procedure is performed.   PERIPHERAL ACCESS:    Using the modified Seldinger technique, femoral arterial and venous access was obtained with placement of 6 Fr sheaths on the left side.  A pigtail diagnostic catheter was passed through the left arterial sheath under fluoroscopic guidance into the aortic root.  The pigtail catheter is positioned in the non-coronary sinus of Valsalva.  A temporary transvenous pacemaker catheter was passed through the left femoral venous sheath under fluoroscopic guidance into the right ventricle.  The pacemaker was tested to ensure stable lead placement and pacemaker capture. Aortic root angiography was performed in order to determine the optimal angiographic angle for valve deployment.   TRANSFEMORAL ACCESS:   Percutaneous transfemoral access and sheath placement was performed using ultrasound guidance.  The right common femoral artery was cannulated using a micropuncture needle and appropriate location was verified using hand injection angiogram.  A pair of Abbott Perclose percutaneous closure devices were placed and a 6 French sheath replaced into the femoral artery.  The patient was heparinized systemically and ACT verified > 250 seconds.    A 18 Fr transfemoral Gore DrySeal sheath was introduced into the right common femoral artery after progressively dilating over an Amplatz superstiff wire. An AL-1 catheter was used to direct a straight-tip exchange length wire across the native aortic valve into the left ventricle. This was exchanged out for a pigtail catheter and position was confirmed in the LV apex. The pigtail catheter was exchanged for Confida wire in the LV apex.  Echocardiography was utilized to confirm appropriate wire position and no sign of entanglement in the mitral subvalvular apparatus.   TRANSCATHETER HEART VALVE  DEPLOYMENT:   A Medtronic CoreValve Evolut ProPlus transcatheter heart valve (size29 mm, serial #[id number]) was prepared and crimped per manufacturer's guidelines, and the proper loading of the valve is confirmed on the [facility] Pro delivery system using flouroscopy. The valve and delivery system were advanced over the guidewire, through the iliac arteries and aorta, and advanced across the aortic arch using flouroscopy. The valve was carefully positioned across the aortic valve annulus. Once appropriate position of the valve has been confirmed by angiographic assessment, the valve is deployed gradually to 80%, at which time a second aortogram was performed to confirm the appropriate depth and position of deployment.  Once final position was confirmed, deployment was completed, the valve released, and the delivery system carefully removed from the aortic root. Valve function is assessed using echocardiography. There is felt to be trivial paravalvular leak and no central aortic insufficiency.  The patient's hemodynamic recovery following valve deployment is rapid and uneventful.     PROCEDURE COMPLETION:   The deployment system is and guidewire were removed and femoral artery closure performed by securing the Perclose sutures.  Protamine was administered once femoral arterial repair was complete. The temporary pacemaker was removed.  The pigtail catheters and femoral sheaths were removed with manual pressure used for hemostasis.   The patient tolerated the procedure well and is transported to the surgical intensive care in stable condition. There were no immediate intraoperative complications. All sponge instrument and needle counts are verified correct at completion of the operation.   No blood products were administered during the operation.     Rexene Alberts, MD 04/28/2021 12:51 PM

## 2021-04-28 NOTE — Anesthesia Procedure Notes (Signed)
Procedure Name: MAC Date/Time: 04/28/2021 11:17 AM Performed by: Janace Litten, CRNA Pre-anesthesia Checklist: Patient identified, Emergency Drugs available, Suction available and Patient being monitored Patient Re-evaluated:Patient Re-evaluated prior to induction Oxygen Delivery Method: Simple face mask

## 2021-04-28 NOTE — Anesthesia Preprocedure Evaluation (Signed)
Anesthesia Evaluation  Patient identified by MRN, date of birth, ID band Patient awake    Reviewed: Allergy & Precautions, NPO status , Patient's Chart, lab work & pertinent test results  Airway Mallampati: II  TM Distance: >3 FB Neck ROM: Full    Dental no notable dental hx.    Pulmonary neg pulmonary ROS, former smoker,    Pulmonary exam normal breath sounds clear to auscultation       Cardiovascular hypertension, + CAD, + Cardiac Stents, + CABG and +CHF  + Valvular Problems/Murmurs AS  Rhythm:Regular Rate:Normal + Systolic murmurs Left Ventricle: Left ventricular ejection fraction, by estimation, is 45  to 50%. The left ventricle has mildly decreased function. The left  ventricle demonstrates global hypokinesis. 3D left ventricular ejection  fraction analysis performed but not  reported based on interpreter judgement due to suboptimal quality. The  left ventricular internal cavity size was normal in size. There is  moderate concentric left ventricular hypertrophy. Left ventricular  diastolic function could not be evaluated due to  atrial fibrillation. Left ventricular diastolic function could not be  evaluated. Elevated left ventricular end-diastolic pressure.   Right Ventricle: The right ventricular size is normal. No increase in  right ventricular wall thickness. Right ventricular systolic function is  normal. There is normal pulmonary artery systolic pressure. The tricuspid  regurgitant velocity is 1.84 m/s, and  with an assumed right atrial pressure of 3 mmHg, the estimated right  ventricular systolic pressure is 35.0 mmHg.   Left Atrium: Left atrial size was normal in size.   Right Atrium: Right atrial size was severely dilated.   Pericardium: There is no evidence of pericardial effusion.   Mitral Valve: The mitral valve is normal in structure. Mild mitral annular  calcification. Mild mitral valve regurgitation. No  evidence of mitral  valve stenosis.   Tricuspid Valve: The tricuspid valve is normal in structure. Tricuspid  valve regurgitation is trivial. No evidence of tricuspid stenosis.   Aortic Valve: The aortic valve is calcified. There is severe calcifcation  of the aortic valve. There is severe thickening of the aortic valve.  Aortic valve regurgitation is not visualized. Severe aortic stenosis is  present. Aortic valve mean gradient  measures 21.0 mmHg. Aortic valve peak gradient measures 29.6 mmHg. Aortic  valve area, by VTI measures 0.63 cm.    Neuro/Psych negative neurological ROS  negative psych ROS   GI/Hepatic Neg liver ROS, GERD  ,  Endo/Other  diabetes  Renal/GU negative Renal ROS  negative genitourinary   Musculoskeletal negative musculoskeletal ROS (+)   Abdominal   Peds negative pediatric ROS (+)  Hematology negative hematology ROS (+)   Anesthesia Other Findings   Reproductive/Obstetrics negative OB ROS                             Anesthesia Physical Anesthesia Plan  ASA: 3  Anesthesia Plan: MAC   Post-op Pain Management:    Induction: Intravenous  PONV Risk Score and Plan: 1 and Propofol infusion  Airway Management Planned: Simple Face Mask  Additional Equipment:   Intra-op Plan:   Post-operative Plan:   Informed Consent: I have reviewed the patients History and Physical, chart, labs and discussed the procedure including the risks, benefits and alternatives for the proposed anesthesia with the patient or authorized representative who has indicated his/her understanding and acceptance.     Dental advisory given  Plan Discussed with: CRNA and Surgeon  Anesthesia Plan  Comments:         Anesthesia Quick Evaluation

## 2021-04-28 NOTE — Progress Notes (Signed)
      [address]       [place] [address]             [phone]     CARDIOTHORACIC SURGERY PROGRESS NOTE  Subjective: [name] has been scheduled for Procedure(s): TRANSCATHETER AORTIC VALVE REPLACEMENT, TRANSFEMORAL (Bilateral) ULTRASOUND GUIDANCE FOR VASCULAR ACCESS (Bilateral) INTRAOPERATIVE TRANSTHORACIC ECHOCARDIOGRAM (Left) today.   Objective: Vital signs in last 24 hours: Temp:  [98 F (36.7 C)-98.5 F (36.9 C)] 98.3 F (36.8 C) (07/12 0938) Pulse Rate:  [49-91] 76 (07/12 0938) Cardiac Rhythm: Atrial fibrillation;Bundle branch block (07/12 0821) Resp:  [15-23] 17 (07/12 0938) BP: (101-171)/(56-118) 171/84 (07/12 0938) SpO2:  [96 %-100 %] 98 % (07/12 0938) Weight:  [77.6 kg] 77.6 kg (07/12 0938)  Physical Exam: Unchanged from previously   Intake/Output from previous day: 07/11 0701 - 07/12 0700 In: -  Out: 1225 [Urine:1225] Intake/Output this shift: No intake/output data recorded.  Lab Results: Recent Labs    04/27/21 1305  WBC 11.1*  HGB 13.1  HCT 39.1  PLT 122*   BMET:  Recent Labs    04/27/21 1305 04/28/21 0257  NA 134* 135  K 3.7 3.7  CL 100 102  CO2 24 26  GLUCOSE 210* 142*  BUN 27* 24*  CREATININE 1.80* 1.72*  CALCIUM 9.5 9.5    CBG (last 3)  Recent Labs    04/27/21 2207 04/28/21 0846 04/28/21 0938  GLUCAP 237* 174* 152*   PT/INR:   Recent Labs    04/27/21 1305  LABPROT 14.0  INR 1.1    Assessment/Plan:   The various methods of treatment have been discussed with the patient. After consideration of the risks, benefits and treatment options the patient has consented to the planned procedure.   The patient has been seen and labs reviewed. There are no changes in the patient's condition to prevent proceeding with the planned procedure today.   Rexene Alberts, MD 04/28/2021 11:08 AM

## 2021-04-28 NOTE — CV Procedure (Signed)
HEART AND VASCULAR CENTER  TAVR OPERATIVE NOTE   Date of Procedure:  04/28/2021  Preoperative Diagnosis: Severe Aortic Stenosis   Postoperative Diagnosis: Same   Procedure:   Transcatheter Aortic Valve Replacement - Transfemoral Approach  Medtronic Evolut Pro THV (size 29 mm, model # [id number], serial # [id number])   Co-Surgeons:  Lauree Chandler, MD and Valentina Gu. [name], MD   Anesthesiologist:  Kalman Shan  Echocardiographer:  Johnsie Cancel  Pre-operative Echo Findings: Severe aortic stenosis Normal left ventricular systolic function  Post-operative Echo Findings: No paravalvular leak Normal left ventricular systolic function  BRIEF CLINICAL NOTE AND INDICATIONS FOR SURGERY  87 yo male with history of arthritis, COPD, CAD s/p 4V CABG in 2009, chronic kidney disease stage 3-4, diabetes mellitus, GERD, HTN, hyperlipidemia, carotid artery disease, chronic diastolic CHF, paroxysmal atrial fibrillation, prostate cancer and severe aortic stenosis who is here today for TAVR. ollow up in the structural heart clinic. He has been followed by Dr. Percival Spanish. He is known to have CAD and has undergone 4V CABG in 2009 (LIMA to LAD, SVG to PDA, SVG to posterolateral artery, SVG to OM). The vein graft to the OM branch was treated with balloon angioplasty in 2015 and stented in 2016. At that time noted to be in atrial fibrillation. He has been on Eliquis. He has had ongoing dyspnea and has been found to have moderate COPD. He was admitted to [facility] in October 2020 with acute on chronic diastolic CHF. Echo at that time showed EF 60-65%, severe LVH, mild AS with mean gradient of 16 mm Hg. He was seen in follow up by Dr. Percival Spanish in 09/2019 and continued to have dyspnea. A Holter monitor was placed which revealed persistent afib. He had a cardioversion in 10/2019 with restoration of sinus rhythm. It was unclear if he had any symptomatic benefit with restoration of sinus. The patient continued to complain of fatigue  and dyspnea and he was referred to pulmonology where he was diagnosed with COPD/moderate persistent asthma and started on inhalers. He was admitted 11/21-11/22/21 for chest pain and dyspnea in the setting of a febrile illness. Blood cultures and covid were negative. He was felt to have acute on chronic diastolic CHF and AECOPD. He improved with IV lasix and steroids. Repeat echo November 2021 showed LVEF 50-55%, severe LVH, moderate to severe AS with a mean gradient of 22 mm Hg, peak gradient 35.8 mm hg, AVA 0.8cm2, DVI 0.25 SVI 29, mild dilation of the ascending aorta at 40 mm. Plans were to refer to structural heart and stress testing in the outpatient setting.   He was in his usual state of health until 09/20/20 when he presented to [facility] ED with chest pain, dyspnea and palpitations. Work up revealed elevated BNP at 685.4, CXR with no acute abnormality, HS trop 67--> 103 felt to be demand ischemia, creat 1.79--> 1.94--> 2.13, covid neg, Ddimer 0.76, blood cultures NGTD. He was started on amiodarone which was later discontinued due to bradycardia. He was treated with IV lasix which was later discontinued due to worsening renal function. Cardiac cath 09/23/20 with severe stent restenosis in the vein graft to the OM treated with balloon angioplasty. Continue patency of the LIMA to LAD, SVG to posterolateral artery and SVG to PDA). Filling pressures were normal. He was felt to have low flow/low gradient aortic stenosis. I saw him as a new structural heart team consult while he was admitted. We had planned outpatient CT scans in workup for TAVR. He was seen in follow  up by Dr. Percival Spanish in March 2022 and reported ongoing dyspnea. Echo today with mean gradient 20 mmHg, AVA below 0.7 cm2. The valve appears severely stenotic.   During the course of the patient's preoperative work up they have been evaluated comprehensively by a multidisciplinary team of specialists coordinated through the [facility] in the [place] [facility].  They have been demonstrated to suffer from symptomatic severe aortic stenosis as noted above. The patient has been counseled extensively as to the relative risks and benefits of all options for the treatment of severe aortic stenosis including long term medical therapy, conventional surgery for aortic valve replacement, and transcatheter aortic valve replacement.  The patient has been independently evaluated by Dr. [name] with CT surgery and they are felt to be at high risk for conventional surgical aortic valve replacement. The surgeon indicated the patient would be a poor candidate for conventional surgery. Based upon review of all of the patient's preoperative diagnostic tests they are felt to be candidate for transcatheter aortic valve replacement using the transfemoral approach as an alternative to high risk conventional surgery.    Following the decision to proceed with transcatheter aortic valve replacement, a discussion has been held regarding what types of management strategies would be attempted intraoperatively in the event of life-threatening complications, including whether or not the patient would be considered a candidate for the use of cardiopulmonary bypass and/or conversion to open sternotomy for attempted surgical intervention.  The patient has been advised of a variety of complications that might develop peculiar to this approach including but not limited to risks of death, stroke, paravalvular leak, aortic dissection or other major vascular complications, aortic annulus rupture, device embolization, cardiac rupture or perforation, acute myocardial infarction, arrhythmia, heart block or bradycardia requiring permanent pacemaker placement, congestive heart failure, respiratory failure, renal failure, pneumonia, infection, other late complications related to structural valve deterioration or migration, or other complications that might  ultimately cause a temporary or permanent loss of functional independence or other long term morbidity.  The patient provides full informed consent for the procedure as described and all questions were answered preoperatively.    DETAILS OF THE OPERATIVE PROCEDURE  PREPARATION:   The patient is brought to the operating room on the above mentioned date and central monitoring was established by the anesthesia team including placement of a radial arterial line. The patient is placed in the supine position on the operating table.  Intravenous antibiotics are administered. Conscious sedation is used.   Baseline transthoracic echocardiogram was performed. The patient's chest, abdomen, both groins, and both lower extremities are prepared and draped in a sterile manner. A time out procedure is performed.   PERIPHERAL ACCESS:   Using the modified Seldinger technique, femoral arterial and venous access were obtained with placement of a 6 Fr sheath in the artery and a 7 Fr sheath in the vein on the left side using u/s guidance.  A pigtail diagnostic catheter was passed through the femoral arterial sheath under fluoroscopic guidance into the aortic root.  A temporary transvenous pacemaker catheter was passed through the femoral venous sheath under fluoroscopic guidance into the right ventricle.  The pacemaker was tested to ensure stable lead placement and pacemaker capture. Aortic root angiography was performed in order to determine the optimal angiographic angle for valve deployment.  TRANSFEMORAL ACCESS:  A micropuncture kit was used to gain access to the right femoral artery using u/s guidance. Position confirmed with angiography. Pre-closure with double  ProGlide closure devices. The patient was heparinized systemically and ACT verified > 250 seconds.    A 18 Fr transfemoral Dry Seal sheath was introduced into the right femoral artery over an Amplatz superstiff wire. An AL-1 catheter was used to direct a  straight-tip exchange length wire across the native aortic valve into the left ventricle. This was exchanged out for a pigtail catheter and position was confirmed in the LV apex. Simultaneous LV and Ao pressures were recorded.  The pigtail catheter was then exchanged for an Confida wire in the LV apex.   TRANSCATHETER HEART VALVE DEPLOYMENT:  A Medtronic Evolut Pro THV size 29 mm was prepared and per manufacturer's guidelines, and the proper orientation of the valve is confirmed on the delivery system.  The valve was advanced through the introducer sheath using normal technique until in an appropriate position in the abdominal aorta beyond the sheath tip. The valve was then advanced across the aortic arch. The valve was carefully positioned across the aortic valve annulus. Once final position of the valve has been confirmed by angiographic assessment, the valve is deployed with controlled rapid pacing. There is felt to be no paravalvular leak and no central aortic insufficiency.  The patient's hemodynamic recovery following valve deployment is good.  Echo demostrated acceptable post-procedural gradients, stable mitral valve function, and no AI.   PROCEDURE COMPLETION:  The sheath was then removed and closure devices were completed. Protamine was administered once femoral arterial repair was complete. The temporary pacemaker, pigtail catheters and femoral sheaths were removed with a Mynx closure device placed in the artery and manual pressure used for venous hemostasis.    The patient tolerated the procedure well and is transported to the surgical intensive care in stable condition. There were no immediate intraoperative complications. All sponge instrument and needle counts are verified correct at completion of the operation.   No blood products were administered during the operation.  The patient received a total of 14 mL of intravenous contrast during the procedure.  Lauree Chandler  MD 04/28/2021 12:49 PM

## 2021-04-28 NOTE — Anesthesia Postprocedure Evaluation (Signed)
Anesthesia Post Note  Patient: [name]  Procedure(s) Performed: TRANSCATHETER AORTIC VALVE REPLACEMENT, TRANSFEMORAL (Bilateral: Groin) ULTRASOUND GUIDANCE FOR VASCULAR ACCESS (Bilateral: Groin) INTRAOPERATIVE TRANSTHORACIC ECHOCARDIOGRAM (Left: Chest)     Patient location during evaluation: PACU Anesthesia Type: MAC Level of consciousness: awake and alert Pain management: pain level controlled Vital Signs Assessment: post-procedure vital signs reviewed and stable Respiratory status: spontaneous breathing, nonlabored ventilation, respiratory function stable and patient connected to nasal cannula oxygen Cardiovascular status: stable and blood pressure returned to baseline Postop Assessment: no apparent nausea or vomiting Anesthetic complications: no   No notable events documented.  Last Vitals:  Vitals:   04/28/21 1305 04/28/21 1310  BP: (!) 120/37 133/71  Pulse: (!) 51 (!) 45  Resp: 17 12  Temp:    SpO2: 93% 98%    Last Pain:  Vitals:   04/28/21 1300  TempSrc: Temporal  PainSc: Asleep                 [name] S

## 2021-04-28 NOTE — Progress Notes (Signed)
  Echocardiogram 2D Echocardiogram has been performed.  [name] 04/28/2021, 12:34 PM

## 2021-04-28 NOTE — Progress Notes (Signed)
Pt arrived back to [facility] from cath lab. Bilateral groin sites level 0. Vitals obtained and stable. Bedrest until 1630. Family at bedside.

## 2021-04-28 NOTE — Progress Notes (Signed)
Mobility Specialist: Progress Note   04/28/21 1845  Mobility  Activity Ambulated in hall  Level of Assistance Standby assist, set-up cues, supervision of patient - no hands on  Assistive Device None  Distance Ambulated (ft) 210 ft  Mobility Ambulated independently in hallway  Mobility Response Tolerated well  Mobility performed by Mobility specialist  $Mobility charge 1 Mobility   Pre-Mobility: 102 HR, 150/88 BP Post-Mobility: 112 HR, 175/86 BP, 100% SpO2  Pt asx throughout ambulation. Pt back to bed after walk with RN present in the room.   [facility] Chayton Murata Mobility Specialist Mobility Specialist Phone: [phone]

## 2021-04-28 NOTE — Anesthesia Procedure Notes (Signed)
Arterial Line Insertion Start/End7/09/2021 10:40 AM Performed by: Janace Litten, CRNA, CRNA  Patient location: OR. Preanesthetic checklist: patient identified, IV checked, surgical consent, monitors and equipment checked and pre-op evaluation Lidocaine 1% used for infiltration Left, radial was placed Catheter size: 20 G Hand hygiene performed  and maximum sterile barriers used   Attempts: 1 (attempt by S. Tasia Catchings) Procedure performed without using ultrasound guided technique. Following insertion, dressing applied and Biopatch. Post procedure assessment: normal

## 2021-04-29 ENCOUNTER — Other Ambulatory Visit: Payer: Self-pay

## 2021-04-29 ENCOUNTER — Inpatient Hospital Stay (HOSPITAL_COMMUNITY): Payer: Medicare PPO

## 2021-04-29 ENCOUNTER — Encounter (HOSPITAL_COMMUNITY): Payer: Self-pay | Admitting: Cardiovascular Disease

## 2021-04-29 DIAGNOSIS — I350 Nonrheumatic aortic (valve) stenosis: Secondary | ICD-10-CM

## 2021-04-29 DIAGNOSIS — Z952 Presence of prosthetic heart valve: Secondary | ICD-10-CM | POA: Diagnosis not present

## 2021-04-29 LAB — BASIC METABOLIC PANEL
Anion gap: 11 (ref 5–15)
BUN: 22 mg/dL (ref 8–23)
CO2: 22 mmol/L (ref 22–32)
Calcium: 9.2 mg/dL (ref 8.9–10.3)
Chloride: 101 mmol/L (ref 98–111)
Creatinine, Ser: 1.75 mg/dL — ABNORMAL HIGH (ref 0.61–1.24)
GFR, Estimated: 37 mL/min — ABNORMAL LOW (ref >60)
Glucose, Bld: 128 mg/dL — ABNORMAL HIGH (ref 70–99)
Potassium: 3.8 mmol/L (ref 3.5–5.1)
Sodium: 134 mmol/L — ABNORMAL LOW (ref 135–145)

## 2021-04-29 LAB — CBC
HCT: 35.4 % — ABNORMAL LOW (ref 39.0–52.0)
Hemoglobin: 11.6 g/dL — ABNORMAL LOW (ref 13.0–17.0)
MCH: 27.9 pg (ref 26.0–34.0)
MCHC: 32.8 g/dL (ref 30.0–36.0)
MCV: 85.1 fL (ref 80.0–100.0)
Platelets: 96 10*3/uL — ABNORMAL LOW (ref 150–400)
RBC: 4.16 MIL/uL — ABNORMAL LOW (ref 4.22–5.81)
RDW: 15.9 % — ABNORMAL HIGH (ref 11.5–15.5)
WBC: 10.7 10*3/uL — ABNORMAL HIGH (ref 4.0–10.5)
nRBC: 0 % (ref 0.0–0.2)

## 2021-04-29 LAB — ECHOCARDIOGRAM COMPLETE
AR max vel: 3.06 cm2
AV Area VTI: 2.77 cm2
AV Area mean vel: 2.67 cm2
AV Mean grad: 4 mmHg
AV Peak grad: 6 mmHg
Ao pk vel: 1.23 m/s
Area-P 1/2: 4.31 cm2
Height: 69 in
S' Lateral: 3.4 cm
Weight: 2737.23 oz

## 2021-04-29 LAB — MAGNESIUM: Magnesium: 1.8 mg/dL (ref 1.7–2.4)

## 2021-04-29 LAB — GLUCOSE, CAPILLARY
Glucose-Capillary: 117 mg/dL — ABNORMAL HIGH (ref 70–99)
Glucose-Capillary: 237 mg/dL — ABNORMAL HIGH (ref 70–99)

## 2021-04-29 MED ORDER — APIXABAN 2.5 MG PO TABS: 2.5000 mg | ORAL_TABLET | Freq: Two times a day (BID) | ORAL | 1 refills | Status: DC

## 2021-04-29 MED ORDER — ASPIRIN 81 MG PO CHEW: 81.0000 mg | CHEWABLE_TABLET | Freq: Every day | ORAL | 2 refills | Status: DC

## 2021-04-29 NOTE — Progress Notes (Addendum)
Progress Note  Patient Name: [name] Date of Encounter: 04/29/2021  [facility] Cardiologist: [name], MD   Subjective   No chest pain or dyspnea. No events overnight. Has ambulated.   Inpatient Medications    Scheduled Meds:  amLODipine  5 mg Oral Daily   aspirin  81 mg Oral Daily   clopidogrel  75 mg Oral Q breakfast   FLUoxetine  10 mg Oral Daily   insulin aspart  0-5 Units Subcutaneous QHS   insulin aspart  0-9 Units Subcutaneous TID WC   pantoprazole  40 mg Oral Daily   rosuvastatin  20 mg Oral QHS   sodium chloride flush  3 mL Intravenous Q12H   Continuous Infusions:  sodium chloride     sodium chloride     nitroGLYCERIN     phenylephrine (NEO-SYNEPHRINE) Adult infusion     PRN Meds: sodium chloride, acetaminophen **OR** acetaminophen, albuterol, diphenhydrAMINE, docusate sodium, metoprolol tartrate, morphine injection, nitroGLYCERIN, ondansetron (ZOFRAN) IV, oxyCODONE, sodium chloride flush, traMADol   Vital Signs    Vitals:   04/28/21 2200 04/28/21 2351 04/29/21 0409 04/29/21 0741  BP: (!) 145/58 (!) 111/41 127/63 116/82  Pulse: 86 77 72 72  Resp: 20 14 16 16   Temp:  98.7 F (37.1 C) 98.4 F (36.9 C) 98.3 F (36.8 C)  TempSrc:  Oral Oral Oral  SpO2: 97% 98% 98% 99%  Weight:   77.6 kg   Height:        Intake/Output Summary (Last 24 hours) at 04/29/2021 0757 Last data filed at 04/28/2021 2200 Gross per 24 hour  Intake 1459.3 ml  Output 395 ml  Net 1064.3 ml   Last 3 Weights 04/29/2021 04/28/2021 04/27/2021  Weight (lbs) 171 lb 1.2 oz 171 lb 1.2 oz 171 lb 1.2 oz  Weight (kg) 77.6 kg 77.6 kg 77.6 kg      Telemetry    Atrial fib, LBBB - Personally Reviewed  ECG    Atrial fib, LBBB - Personally Reviewed  Physical Exam   GEN: No acute distress.   Neck: No JVD Cardiac: Irreg irreg. No murmurs, rubs, or gallops.  Respiratory: Clear to auscultation bilaterally. GI: Soft, nontender, non-distended  MS: No edema; No  deformity. Neuro:  Nonfocal  Psych: Normal affect   Labs    High Sensitivity Troponin:   Recent Labs  Lab 04/27/21 1305 04/27/21 1425  TROPONINIHS 59* 62*      Chemistry Recent Labs  Lab 04/27/21 1305 04/28/21 0257 04/28/21 1136 04/28/21 1317 04/29/21 0128  NA 134* 135 137 137 134*  K 3.7 3.7 4.1 3.9 3.8  CL 100 102 102 102 101  CO2 24 26  --   --  22  GLUCOSE 210* 142* 155* 167* 128*  BUN 27* 24* 21 22 22   CREATININE 1.80* 1.72* 1.50* 1.50* 1.75*  CALCIUM 9.5 9.5  --   --  9.2  PROT 6.8  --   --   --   --   ALBUMIN 3.4*  --   --   --   --   AST 14*  --   --   --   --   ALT 19  --   --   --   --   ALKPHOS 97  --   --   --   --   BILITOT 1.5*  --   --   --   --   GFRNONAA 36* 38*  --   --  37*  ANIONGAP  10 7  --   --  11     Hematology Recent Labs  Lab 04/27/21 1305 04/28/21 1136 04/28/21 1317 04/29/21 0128  WBC 11.1*  --   --  10.7*  RBC 4.62  --   --  4.16*  HGB 13.1 12.2* 11.9* 11.6*  HCT 39.1 36.0* 35.0* 35.4*  MCV 84.6  --   --  85.1  MCH 28.4  --   --  27.9  MCHC 33.5  --   --  32.8  RDW 15.9*  --   --  15.9*  PLT 122*  --   --  96*    BNP Recent Labs  Lab 04/27/21 1305  BNP 695.0*     DDimer No results for input(s): DDIMER in the last 168 hours.   Radiology    DG Chest 2 View  Result Date: 04/27/2021 CLINICAL DATA:  Severe aortic stenosis, preoperative assessment, history coronary artery disease, CHF, hypertension, former smoker EXAM: CHEST - 2 VIEW COMPARISON:  09/20/2020 FINDINGS: Enlargement of cardiac silhouette post CABG and coronary stenting. Mediastinal contours and pulmonary vascularity normal. Atherosclerotic calcification aorta. Chronic LEFT basilar atelectasis and scarring, including an area of nodular opacity at the medial LEFT lung base, unchanged since CT exam of 03/26/2021 as well as an earlier CT of 02/20/2019. No acute infiltrate, pleural effusion or pneumothorax. Bones demineralized. IMPRESSION: Enlargement of cardiac  silhouette post CABG and coronary stenting. LEFT basilar scarring including an area of chronic nodularity at LEFT lower lobe unchanged since 2020. No acute abnormalities. Aortic Atherosclerosis (ICD10-I70.0). Electronically Signed   By: Lavonia Dana M.D.   On: 04/27/2021 13:05   DG Chest Port 1 View  Result Date: 04/28/2021 CLINICAL DATA:  Atelectasis. EXAM: PORTABLE CHEST 1 VIEW COMPARISON:  April 27, 2021. FINDINGS: Similar enlarged cardiac silhouette with postsurgical changes of CABG and coronary artery stenting. Pulmonary vascular congestion. Median sternotomy. Calcific atherosclerosis of the aorta. No substantial change in bibasilar opacities, including nodular opacity at the left lung base. IMPRESSION: 1. No substantial change in suspected bibasilar atelectasis/scarring, including an area chronic nodularity at the left lung base that was better characterized on recent CT chest. 2. Similar cardiomegaly and pulmonary vascular congestion. Electronically Signed   By: Margaretha Sheffield MD   On: 04/28/2021 15:50   ECHOCARDIOGRAM LIMITED  Result Date: 04/28/2021    ECHOCARDIOGRAM LIMITED REPORT   Patient Name:   [name] Date of Exam: 04/28/2021 Medical Rec #:  [id number]    Height:       69.0 in Accession #:    [id number]   Weight:       171.1 lb Date of Birth:  December 26, 1932    BSA:          1.933 m Patient Age:    83 years     BP:           119/74 mmHg Patient Gender: M            HR:           76 bpm. Exam Location:  Inpatient Procedure: Limited Echo, Cardiac Doppler and Color Doppler Indications:    Aortic stenosis I35.0  History:        Patient has prior history of Echocardiogram examinations, most                 recent 03/12/2021. CAD, Arrythmias:Atrial Fibrillation,                 Signs/Symptoms:Chest Pain and  Shortness of Breath; Risk                 Factors:Hypertension, Dyslipidemia and Diabetes. CKD.                 Aortic Valve: 29 mm stented (TAVR) valve is present in the                 aortic  position. Procedure Date: 04/28/2021.  Sonographer:    Vickie Epley RDCS Referring Phys: [place]  1. RWMA;s hard to evaluate on limited echo no 2 chamber view septal, apica and inferior lateral hypokinesis . Left ventricular ejection fraction, by estimation, is 40 to 45%. The left ventricle has mildly decreased function. The left ventricular internal cavity size was mildly dilated. There is moderate left ventricular hypertrophy.  2. Right ventricular systolic function is normal. The right ventricular size is normal.  3. Left atrial size was moderately dilated.  4. Right atrial size was mildly dilated.  5. The mitral valve is abnormal. Trivial mitral valve regurgitation. Moderate mitral annular calcification.  6. Pre TAVR: calcified tri leaflet with fusion of right and left cusps low flow severe AS supine in OR mean gradient 17 peak 29 mmHg AVA 0.77 cm2         Post TAVR: well positioned 29 mm Medtronic Evolut Pro valve : trivial PVL seen best on PSSX images at 2:00 mean gradient 3 peak 6 mmHg AVA 2.7 cm2. There is a 29 mm stented (TAVR) valve present in the aortic position. Procedure Date: 04/28/2021. FINDINGS  Left Ventricle: RWMA;s hard to evaluate on limited echo no 2 chamber view septal, apica and inferior lateral hypokinesis. Left ventricular ejection fraction, by estimation, is 40 to 45%. The left ventricle has mildly decreased function. The left ventricular internal cavity size was mildly dilated. There is moderate left ventricular hypertrophy. Right Ventricle: The right ventricular size is normal. No increase in right ventricular wall thickness. Right ventricular systolic function is normal. Left Atrium: Left atrial size was moderately dilated. Right Atrium: Right atrial size was mildly dilated. Pericardium: There is no evidence of pericardial effusion. Mitral Valve: The mitral valve is abnormal. There is mild thickening of the mitral valve leaflet(s). There is mild  calcification of the mitral valve leaflet(s). Moderate mitral annular calcification. Trivial mitral valve regurgitation. Aortic Valve: Pre TAVR: calcified tri leaflet with fusion of right and left cusps low flow severe AS supine in OR mean gradient 17 peak 29 mmHg AVA 0.77 cm2 Post TAVR: well positioned 29 mm Medtronic Evolut Pro valve : trivial PVL seen best on PSSX images at 2:00 mean gradient 3 peak 6 mmHg AVA 2.7 cm2. Aortic valve mean gradient measures 3.0 mmHg. Aortic valve peak gradient measures 5.9 mmHg. Aortic valve area, by VTI measures 2.69 cm. There is a 29 mm stented (TAVR) valve present in the aortic position. Procedure Date: 04/28/2021. LEFT VENTRICLE PLAX 2D LVOT diam:     2.10 cm LV SV:         69 LV SV Index:   35 LVOT Area:     3.46 cm  AORTIC VALVE AV Area (Vmax):    2.65 cm AV Area (Vmean):   2.77 cm AV Area (VTI):     2.69 cm AV Vmax:           121.00 cm/s AV Vmean:          81.000 cm/s AV VTI:  0.255 m AV Peak Grad:      5.9 mmHg AV Mean Grad:      3.0 mmHg LVOT Vmax:         92.70 cm/s LVOT Vmean:        64.700 cm/s LVOT VTI:          0.198 m LVOT/AV VTI ratio: 0.78  SHUNTS Systemic VTI:  0.20 m Systemic Diam: 2.10 cm Jenkins Rouge MD Electronically signed by Jenkins Rouge MD Signature Date/Time: 04/28/2021/1:07:25 PM    Final    Structural Heart Procedure  Result Date: 04/28/2021 See surgical note for result.   Cardiac Studies     Patient Profile     87 y.o. male with history of CAD s/p CABG in 2009 with subsequent balloon angioplasty to SVG-OM in 2015, stented in 2016, and most recently 09/2020, chronic combined CHF, paroxysmal atrial fibrillation, HTN, HLD, DM type 2, GERD, COPD and severe AS admitted with chest pain, planned TAVR 04/28/21  Assessment & Plan    Severe aortic stenosis: POD #1 s/p TAVR with placement of Medtronic Evolut Pro valve from the transfemoral approach. He is doing well today. Groins soft. BP stable. Atrial fib on tele. LBBB is chronic.  Will plan ASA and Eliquis at discharge. He can resume his Eliquis tonight. Echo this am showed that his valve if functioning well with no leak. He can be discharged home today.   For questions or updates, please contact [place] Please consult [email or web address] for contact info under        Signed, Lauree Chandler, MD  04/29/2021, 7:57 AM

## 2021-04-29 NOTE — Progress Notes (Signed)
  Echocardiogram 2D Echocardiogram has been performed.  [name] 04/29/2021, 10:22 AM

## 2021-04-29 NOTE — Progress Notes (Signed)
CARDIAC REHAB PHASE I   PRE:  Rate/Rhythm: 88 afib  BP:  Supine: 158/66  Sitting:   Standing:    SaO2: 97%RA  MODE:  Ambulation: 230 ft   POST:  Rate/Rhythm: 95 afib PVCs  BP:  Supine:   Sitting: 169/51  Standing:    SaO2: 94%RA 1036-1110 Pt walked 230 ft on RA with hand held asst. Gait fairly steady. Back to bed after walk. Not interested in CRP 2 referral . Gave heart healthy diet for information.    Graylon Good, RN BSN  04/29/2021 11:20 AM

## 2021-04-29 NOTE — Progress Notes (Signed)
Pt discharging home with wife. AVS reviewed with pt and spouse. IVs removed. All belongings packed.

## 2021-04-29 NOTE — Discharge Summary (Addendum)
Discharge Summary    Patient ID: [name] MRN: [id number]; DOB: June 20, 1933  Admit date: 04/27/2021 Discharge date: 04/29/2021  PCP:  [name], MD   [place] Providers Cardiologist:  [name], MD /Dr. Angelena Form, MD   Discharge Diagnoses    Active Problems:   Aortic stenosis, severe   Severe aortic stenosis  Diagnostic Studies/Procedures      Date of Procedure:                04/28/2021   Preoperative Diagnosis:      Severe Aortic Stenosis   Postoperative Diagnosis:    Same   Procedure:        Transcatheter Aortic Valve Replacement - Transfemoral Approach             Medtronic Evolut Pro THV (size 29 mm, model # [id number], serial # [id number])              Co-Surgeons:                        Lauree Chandler, MD and Valentina Gu. [name], MD   Anesthesiologist:                  Kalman Shan   Echocardiographer:              Johnsie Cancel   Pre-operative Echo Findings: Severe aortic stenosis Normal left ventricular systolic function   Post-operative Echo Findings: No paravalvular leak Normal left ventricular systolic function   BRIEF CLINICAL NOTE AND INDICATIONS FOR SURGERY   87 yo male with history of arthritis, COPD, CAD s/p 4V CABG in 2009, chronic kidney disease stage 3-4, diabetes mellitus, GERD, HTN, hyperlipidemia, carotid artery disease, chronic diastolic CHF, paroxysmal atrial fibrillation, prostate cancer and severe aortic stenosis who is here today for TAVR. ollow up in the structural heart clinic. He has been followed by Dr. Percival Spanish. He is known to have CAD and has undergone 4V CABG in 2009 (LIMA to LAD, SVG to PDA, SVG to posterolateral artery, SVG to OM). The vein graft to the OM branch was treated with balloon angioplasty in 2015 and stented in 2016. At that time noted to be in atrial fibrillation. He has been on Eliquis. He has had ongoing dyspnea and has been found to have moderate COPD. He was admitted to [facility] in October 2020 with acute on  chronic diastolic CHF. Echo at that time showed EF 60-65%, severe LVH, mild AS with mean gradient of 16 mm Hg. He was seen in follow up by Dr. Percival Spanish in 09/2019 and continued to have dyspnea. A Holter monitor was placed which revealed persistent afib. He had a cardioversion in 10/2019 with restoration of sinus rhythm. It was unclear if he had any symptomatic benefit with restoration of sinus. The patient continued to complain of fatigue and dyspnea and he was referred to pulmonology where he was diagnosed with COPD/moderate persistent asthma and started on inhalers. He was admitted 11/21-11/22/21 for chest pain and dyspnea in the setting of a febrile illness. Blood cultures and covid were negative. He was felt to have acute on chronic diastolic CHF and AECOPD. He improved with IV lasix and steroids. Repeat echo November 2021 showed LVEF 50-55%, severe LVH, moderate to severe AS with a mean gradient of 22 mm Hg, peak gradient 35.8 mm hg, AVA 0.8cm2, DVI 0.25 SVI 29, mild dilation of the ascending aorta at 40 mm. Plans were to refer to structural heart and stress testing  in the outpatient setting.   He was in his usual state of health until 09/20/20 when he presented to [facility] ED with chest pain, dyspnea and palpitations. Work up revealed elevated BNP at 685.4, CXR with no acute abnormality, HS trop 67--> 103 felt to be demand ischemia, creat 1.79--> 1.94--> 2.13, covid neg, Ddimer 0.76, blood cultures NGTD. He was started on amiodarone which was later discontinued due to bradycardia. He was treated with IV lasix which was later discontinued due to worsening renal function. Cardiac cath 09/23/20 with severe stent restenosis in the vein graft to the OM treated with balloon angioplasty. Continue patency of the LIMA to LAD, SVG to posterolateral artery and SVG to PDA). Filling pressures were normal. He was felt to have low flow/low gradient aortic stenosis. I saw him as a new structural heart team consult while he was  admitted. We had planned outpatient CT scans in workup for TAVR. He was seen in follow up by Dr. Percival Spanish in March 2022 and reported ongoing dyspnea. Echo today with mean gradient 20 mmHg, AVA below 0.7 cm2. The valve appears severely stenotic.    During the course of the patient's preoperative work up they have been evaluated comprehensively by a multidisciplinary team of specialists coordinated through the [facility] in the [facility].  They have been demonstrated to suffer from symptomatic severe aortic stenosis as noted above. The patient has been counseled extensively as to the relative risks and benefits of all options for the treatment of severe aortic stenosis including long term medical therapy, conventional surgery for aortic valve replacement, and transcatheter aortic valve replacement.  The patient has been independently evaluated by Dr. [name] with CT surgery and they are felt to be at high risk for conventional surgical aortic valve replacement. The surgeon indicated the patient would be a poor candidate for conventional surgery. Based upon review of all of the patient's preoperative diagnostic tests they are felt to be candidate for transcatheter aortic valve replacement using the transfemoral approach as an alternative to high risk conventional surgery.     Following the decision to proceed with transcatheter aortic valve replacement, a discussion has been held regarding what types of management strategies would be attempted intraoperatively in the event of life-threatening complications, including whether or not the patient would be considered a candidate for the use of cardiopulmonary bypass and/or conversion to open sternotomy for attempted surgical intervention.  The patient has been advised of a variety of complications that might develop peculiar to this approach including but not limited to risks of death, stroke, paravalvular leak, aortic  dissection or other major vascular complications, aortic annulus rupture, device embolization, cardiac rupture or perforation, acute myocardial infarction, arrhythmia, heart block or bradycardia requiring permanent pacemaker placement, congestive heart failure, respiratory failure, renal failure, pneumonia, infection, other late complications related to structural valve deterioration or migration, or other complications that might ultimately cause a temporary or permanent loss of functional independence or other long term morbidity.  The patient provides full informed consent for the procedure as described and all questions were answered preoperatively.       DETAILS OF THE OPERATIVE PROCEDURE   PREPARATION:   The patient is brought to the operating room on the above mentioned date and central monitoring was established by the anesthesia team including placement of a radial arterial line. The patient is placed in the supine position on the operating table.  Intravenous antibiotics are administered. Conscious sedation is used.  Baseline transthoracic echocardiogram was performed. The patient's chest, abdomen, both groins, and both lower extremities are prepared and draped in a sterile manner. A time out procedure is performed.     PERIPHERAL ACCESS:   Using the modified Seldinger technique, femoral arterial and venous access were obtained with placement of a 6 Fr sheath in the artery and a 7 Fr sheath in the vein on the left side using u/s guidance.  A pigtail diagnostic catheter was passed through the femoral arterial sheath under fluoroscopic guidance into the aortic root.  A temporary transvenous pacemaker catheter was passed through the femoral venous sheath under fluoroscopic guidance into the right ventricle.  The pacemaker was tested to ensure stable lead placement and pacemaker capture. Aortic root angiography was performed in order to determine the optimal angiographic angle for valve  deployment.   TRANSFEMORAL ACCESS: A micropuncture kit was used to gain access to the right femoral artery using u/s guidance. Position confirmed with angiography. Pre-closure with double ProGlide closure devices. The patient was heparinized systemically and ACT verified > 250 seconds.     A 18 Fr transfemoral Dry Seal sheath was introduced into the right femoral artery over an Amplatz superstiff wire. An AL-1 catheter was used to direct a straight-tip exchange length wire across the native aortic valve into the left ventricle. This was exchanged out for a pigtail catheter and position was confirmed in the LV apex. Simultaneous LV and Ao pressures were recorded.  The pigtail catheter was then exchanged for an Confida wire in the LV apex.   TRANSCATHETER HEART VALVE DEPLOYMENT:  A Medtronic Evolut Pro THV size 29 mm was prepared and per manufacturer's guidelines, and the proper orientation of the valve is confirmed on the delivery system.  The valve was advanced through the introducer sheath using normal technique until in an appropriate position in the abdominal aorta beyond the sheath tip. The valve was then advanced across the aortic arch. The valve was carefully positioned across the aortic valve annulus. Once final position of the valve has been confirmed by angiographic assessment, the valve is deployed with controlled rapid pacing. There is felt to be no paravalvular leak and no central aortic insufficiency.  The patient's hemodynamic recovery following valve deployment is good.  Echo demostrated acceptable post-procedural gradients, stable mitral valve function, and no AI.   PROCEDURE COMPLETION:  The sheath was then removed and closure devices were completed. Protamine was administered once femoral arterial repair was complete. The temporary pacemaker, pigtail catheters and femoral sheaths were removed with a Mynx closure device placed in the artery and manual pressure used for venous hemostasis.      The patient tolerated the procedure well and is transported to the surgical intensive care in stable condition. There were no immediate intraoperative complications. All sponge instrument and needle counts are verified correct at completion of the operation.   No blood products were administered during the operation.   The patient received a total of 14 mL of intravenous contrast during the procedure.   Post procedure echocardiogram 04/29/21:   Epic results pending however reviewed by Dr. Angelena Form     History of Present Illness     [name] is a 87 y.o. male with a PMH of CAD s/p CABG in 2009 with subsequent balloon angioplasty to SVG-OM in 2015, stented in 2016, and most recently 09/2020, chronic combined CHF, paroxysmal atrial fibrillation, HTN, HLD, DM type 2, GERD, and COPD, who was seen 04/27/2021 for the evaluation of  chest pain.  [name] presented for his preop evaluation 04/27/21 in anticipation of TAVR  04/28/21. While at his visit, he reported a 6/10 chest pain and SOB for which he took SL nitro with improvement in symptoms. Given active chest pain he was referred to the ED for further evaluation.   He was last evaluated by cardiology at an outpatient visit with Dr. Angelena Form 02/2021 for ongoing TAVR evaluation, at which time he continued to have some DOE and fatigue but no exertional chest pain. He reported occasional resting sharp chest pain. He was felt to be a good candidate for TAVR and recommended for ongoing work-up including CTA C/A/P, cardiac CT, and carotid dopplers. He was seen by Dr. [name] 04/14/21 and was felt to be a poor candidate for conventional surgical AVR and was scheduled for TAVR 04/28/21.   His last echocardiogram 03/12/21 showed EF 45-50%, moderate concentric LVH, indeterminate LV diastolic function, severe RAE, mild MR, and severe AS with mean gradient 21 mmHg c/w low flow low gradient severe AS. His last ischemic evaluation was a [facility] 09/2020 which showed severe  3 vessel native disease with patent LIMA to LAD, SVG-L posterior branch, SVG-R PDA, with severe ISR in the SVG-OM which was treated with balloon angioplasty.    Hospital Course    [name] underwent TAVR procedure 04/28/21 using a Medtronic Evolut Pro THV size 29 mm from the transfemoral approach. Valve was prepared and per manufacturer's guidelines, and the proper orientation of the valve was confirmed on the delivery system. Final position of the valve was confirmed by angiographic assessment with no paravalvular leak and no central aortic insufficiency per imaging and TEE with acceptable post-procedural gradients, stable mitral valve function, and no AI. He tolerated the procedure exceptionally well with no immediate complications. Groin sites are stable with no s/s of hematoma or bleeding. He remains in AF on telemetry review with known LBBB. Post procedure echocardiogram today showed well functioning valve with no leak per Dr. Angelena Form. Follow up appointments have been made.   Medication plan is as follows: ASA and Eliquis (resume 7/13 PM)  Consultants: None    The patient was seen and examined by Dr. Angelena Form who feels that the patient is stable and ready for d/c today, 04/29/21.  ____________  Discharge Vitals Blood pressure (!) 169/51, pulse 76, temperature 98 F (36.7 C), temperature source Oral, resp. rate 20, height _0  (1.753 m), weight 77.6 kg, SpO2 100 %.  Filed Weights   04/27/21 2312 04/28/21 0938 04/29/21 0409  Weight: 77.6 kg 77.6 kg 77.6 kg    Labs & Radiologic Studies    CBC Recent Labs    04/27/21 1305 04/28/21 1136 04/28/21 1317 04/29/21 0128  WBC 11.1*  --   --  10.7*  NEUTROABS 8.6*  --   --   --   HGB 13.1   < > 11.9* 11.6*  HCT 39.1   < > 35.0* 35.4*  MCV 84.6  --   --  85.1  PLT 122*  --   --  96*   < > = values in this interval not displayed.   Basic Metabolic Panel Recent Labs    04/27/21 1305 04/28/21 0257 04/28/21 1136 04/28/21 1317  04/29/21 0128  NA 134* 135   < > 137 134*  K 3.7 3.7   < > 3.9 3.8  CL 100 102   < > 102 101  CO2 24 26  --   --  22  GLUCOSE  210* 142*   < > 167* 128*  BUN 27* 24*   < > 22 22  CREATININE 1.80* 1.72*   < > 1.50* 1.75*  CALCIUM 9.5 9.5  --   --  9.2  MG 1.9  --   --   --  1.8   < > = values in this interval not displayed.   Liver Function Tests Recent Labs    04/27/21 1305  AST 14*  ALT 19  ALKPHOS 97  BILITOT 1.5*  PROT 6.8  ALBUMIN 3.4*   No results for input(s): LIPASE, AMYLASE in the last 72 hours. High Sensitivity Troponin:   Recent Labs  Lab 04/27/21 1305 04/27/21 1425  TROPONINIHS 59* 62*    BNP Invalid input(s): POCBNP D-Dimer No results for input(s): DDIMER in the last 72 hours. Hemoglobin A1C No results for input(s): HGBA1C in the last 72 hours. Fasting Lipid Panel No results for input(s): CHOL, HDL, LDLCALC, TRIG, CHOLHDL, LDLDIRECT in the last 72 hours. Thyroid Function Tests No results for input(s): TSH, T4TOTAL, T3FREE, THYROIDAB in the last 72 hours.  Invalid input(s): FREET3 _____________  DG Chest 2 View  Result Date: 04/27/2021 CLINICAL DATA:  Severe aortic stenosis, preoperative assessment, history coronary artery disease, CHF, hypertension, former smoker EXAM: CHEST - 2 VIEW COMPARISON:  09/20/2020 FINDINGS: Enlargement of cardiac silhouette post CABG and coronary stenting. Mediastinal contours and pulmonary vascularity normal. Atherosclerotic calcification aorta. Chronic LEFT basilar atelectasis and scarring, including an area of nodular opacity at the medial LEFT lung base, unchanged since CT exam of 03/26/2021 as well as an earlier CT of 02/20/2019. No acute infiltrate, pleural effusion or pneumothorax. Bones demineralized. IMPRESSION: Enlargement of cardiac silhouette post CABG and coronary stenting. LEFT basilar scarring including an area of chronic nodularity at LEFT lower lobe unchanged since 2020. No acute abnormalities. Aortic  Atherosclerosis (ICD10-I70.0). Electronically Signed   By: Lavonia Dana M.D.   On: 04/27/2021 13:05   DG Chest Port 1 View  Result Date: 04/28/2021 CLINICAL DATA:  Atelectasis. EXAM: PORTABLE CHEST 1 VIEW COMPARISON:  April 27, 2021. FINDINGS: Similar enlarged cardiac silhouette with postsurgical changes of CABG and coronary artery stenting. Pulmonary vascular congestion. Median sternotomy. Calcific atherosclerosis of the aorta. No substantial change in bibasilar opacities, including nodular opacity at the left lung base. IMPRESSION: 1. No substantial change in suspected bibasilar atelectasis/scarring, including an area chronic nodularity at the left lung base that was better characterized on recent CT chest. 2. Similar cardiomegaly and pulmonary vascular congestion. Electronically Signed   By: Margaretha Sheffield MD   On: 04/28/2021 15:50   ECHOCARDIOGRAM LIMITED  Result Date: 04/28/2021    ECHOCARDIOGRAM LIMITED REPORT   Patient Name:   [name] Date of Exam: 04/28/2021 Medical Rec #:  [id number]    Height:       69.0 in Accession #:    [id number]   Weight:       171.1 lb Date of Birth:  September 03, 1933    BSA:          1.933 m Patient Age:    75 years     BP:           119/74 mmHg Patient Gender: M            HR:           76 bpm. Exam Location:  Inpatient Procedure: Limited Echo, Cardiac Doppler and Color Doppler Indications:    Aortic stenosis I35.0  History:  Patient has prior history of Echocardiogram examinations, most                 recent 03/12/2021. CAD, Arrythmias:Atrial Fibrillation,                 Signs/Symptoms:Chest Pain and Shortness of Breath; Risk                 Factors:Hypertension, Dyslipidemia and Diabetes. CKD.                 Aortic Valve: 29 mm stented (TAVR) valve is present in the                 aortic position. Procedure Date: 04/28/2021.  Sonographer:    Vickie Epley RDCS Referring Phys: Edie  1. RWMA;s hard to evaluate on limited echo no 2  chamber view septal, apica and inferior lateral hypokinesis . Left ventricular ejection fraction, by estimation, is 40 to 45%. The left ventricle has mildly decreased function. The left ventricular internal cavity size was mildly dilated. There is moderate left ventricular hypertrophy.  2. Right ventricular systolic function is normal. The right ventricular size is normal.  3. Left atrial size was moderately dilated.  4. Right atrial size was mildly dilated.  5. The mitral valve is abnormal. Trivial mitral valve regurgitation. Moderate mitral annular calcification.  6. Pre TAVR: calcified tri leaflet with fusion of right and left cusps low flow severe AS supine in OR mean gradient 17 peak 29 mmHg AVA 0.77 cm2         Post TAVR: well positioned 29 mm Medtronic Evolut Pro valve : trivial PVL seen best on PSSX images at 2:00 mean gradient 3 peak 6 mmHg AVA 2.7 cm2. There is a 29 mm stented (TAVR) valve present in the aortic position. Procedure Date: 04/28/2021. FINDINGS  Left Ventricle: RWMA;s hard to evaluate on limited echo no 2 chamber view septal, apica and inferior lateral hypokinesis. Left ventricular ejection fraction, by estimation, is 40 to 45%. The left ventricle has mildly decreased function. The left ventricular internal cavity size was mildly dilated. There is moderate left ventricular hypertrophy. Right Ventricle: The right ventricular size is normal. No increase in right ventricular wall thickness. Right ventricular systolic function is normal. Left Atrium: Left atrial size was moderately dilated. Right Atrium: Right atrial size was mildly dilated. Pericardium: There is no evidence of pericardial effusion. Mitral Valve: The mitral valve is abnormal. There is mild thickening of the mitral valve leaflet(s). There is mild calcification of the mitral valve leaflet(s). Moderate mitral annular calcification. Trivial mitral valve regurgitation. Aortic Valve: Pre TAVR: calcified tri leaflet with fusion of right  and left cusps low flow severe AS supine in OR mean gradient 17 peak 29 mmHg AVA 0.77 cm2 Post TAVR: well positioned 29 mm Medtronic Evolut Pro valve : trivial PVL seen best on PSSX images at 2:00 mean gradient 3 peak 6 mmHg AVA 2.7 cm2. Aortic valve mean gradient measures 3.0 mmHg. Aortic valve peak gradient measures 5.9 mmHg. Aortic valve area, by VTI measures 2.69 cm. There is a 29 mm stented (TAVR) valve present in the aortic position. Procedure Date: 04/28/2021. LEFT VENTRICLE PLAX 2D LVOT diam:     2.10 cm LV SV:         69 LV SV Index:   35 LVOT Area:     3.46 cm  AORTIC VALVE AV Area (Vmax):    2.65 cm AV Area (Vmean):   2.77  cm AV Area (VTI):     2.69 cm AV Vmax:           121.00 cm/s AV Vmean:          81.000 cm/s AV VTI:            0.255 m AV Peak Grad:      5.9 mmHg AV Mean Grad:      3.0 mmHg LVOT Vmax:         92.70 cm/s LVOT Vmean:        64.700 cm/s LVOT VTI:          0.198 m LVOT/AV VTI ratio: 0.78  SHUNTS Systemic VTI:  0.20 m Systemic Diam: 2.10 cm Jenkins Rouge MD Electronically signed by Jenkins Rouge MD Signature Date/Time: 04/28/2021/1:07:25 PM    Final    Structural Heart Procedure  Result Date: 04/28/2021 See surgical note for result.  Disposition   Pt is being discharged home today in good condition.  Follow-up Plans & Appointments    Follow-up Information     Eileen Stanford, PA-C Follow up on 05/13/2021.   Specialties: Cardiology, Radiology Why: at 3:30pm Contact information: [address] [place] [address] [phone]                Discharge Instructions     Call MD for:  difficulty breathing, headache or visual disturbances   Complete by: As directed    Call MD for:  extreme fatigue   Complete by: As directed    Call MD for:  hives   Complete by: As directed    Call MD for:  persistant dizziness or light-headedness   Complete by: As directed    Call MD for:  persistant nausea and vomiting   Complete by: As directed     Call MD for:  redness, tenderness, or signs of infection (pain, swelling, redness, odor or green/yellow discharge around incision site)   Complete by: As directed    Call MD for:  severe uncontrolled pain   Complete by: As directed    Call MD for:  temperature >100.4   Complete by: As directed    Diet - low sodium heart healthy   Complete by: As directed    Diet - low sodium heart healthy   Complete by: As directed    Discharge instructions   Complete by: As directed    ACTIVITY AND EXERCISE  Daily activity and exercise are an important part of your recovery. People recover at different rates depending on their general health and type of valve procedure.  Most people recovering from TAVR feel better relatively quickly   No lifting, pushing, pulling more than 10 pounds (examples to avoid: groceries, vacuuming, gardening, golfing):             - For one week with a procedure through the groin.             - For six weeks for procedures through the chest wall or neck. NOTE: You will typically see one of our providers 7-14 days after your procedure to discuss [place] the above activities.      DRIVING  Do not drive until you are seen for follow up and cleared by a provider. Generally, we ask patient to not drive for 1 week after their procedure.  If you have been told by your doctor in the past that you may not drive, you must talk with him/her before you begin  driving again.   DRESSING  Groin site: you may leave the clear dressing over the site for up to one week or until it falls off.   HYGIENE  If you had a femoral (leg) procedure, you may take a shower when you return home. After the shower, pat the site dry. Do NOT use powder, oils or lotions in your groin area until the site has completely healed.  If you had a chest procedure, you may shower when you return home unless specifically instructed not to by your discharging practitioner.             - DO NOT scrub  incision; pat dry with a towel.             - DO NOT apply any lotions, oils, powders to the incision.             - No tub baths / swimming for at least 2 weeks.  If you notice any fevers, chills, increased pain, swelling, bleeding or pus, please contact your doctor.   ADDITIONAL INFORMATION  If you are going to have an upcoming dental procedure, please contact our office as you will require antibiotics ahead of time to prevent infection on your heart valve.    If you have any questions or concerns you can call the structural heart phone during normal business hours 8am-4pm. If you have an urgent need after hours or weekends please call [phone] to talk to the on call provider for general cardiology. If you have an emergency that requires immediate attention, please call 911.    After TAVR Checklist  Check  Test Description  Follow up appointment in 1-2 weeks  You will see our structural heart physician assistant, Nell Range. Your incision sites will be checked and you will be cleared to drive and resume all normal activities if you are doing well.    1 month echo and follow up  You will have an echo to check on your new heart valve and be seen back in the office by Nell Range. Many times the echo is not read by your appointment time, but Joellen Jersey will call you later that day or the following day to report your results.  Follow up with your primary cardiologist You will need to be seen by your primary cardiologist in the following 3-6 months after your 1 month appointment in the valve clinic. Often times your Plavix or Aspirin will be discontinued during this time, but this is decided on a case by case basis.   1 year echo and follow up You will have another echo to check on your heart valve after 1 year and be seen back in the office by Nell Range. This your last structural heart visit.  Bacterial endocarditis prophylaxis  You will have to take antibiotics for the rest of your  life before all dental procedures (even teeth cleanings) to protect your heart valve. Antibiotics are also required before some surgeries. Please check with your cardiologist before scheduling any surgeries. Also, please make sure to tell us if you have a penicillin allergy as you will require an alternative antibiotic.   Discharge instructions   Complete by: As directed    ACTIVITY AND EXERCISE  Daily activity and exercise are an important part of your recovery. People recover at different rates depending on their general health and type of valve procedure.  Most people recovering from TAVR feel better relatively quickly   No lifting, pushing, pulling more than  10 pounds (examples to avoid: groceries, vacuuming, gardening, golfing):             - For one week with a procedure through the groin.             - For six weeks for procedures through the chest wall or neck. NOTE: You will typically see one of our providers 7-14 days after your procedure to discuss Forrest the above activities.      DRIVING  Do not drive until you are seen for follow up and cleared by a provider. Generally, we ask patient to not drive for 1 week after their procedure.  If you have been told by your doctor in the past that you may not drive, you must talk with him/her before you begin driving again.   DRESSING  Groin site: you may leave the clear dressing over the site for up to one week or until it falls off.   HYGIENE  If you had a femoral (leg) procedure, you may take a shower when you return home. After the shower, pat the site dry. Do NOT use powder, oils or lotions in your groin area until the site has completely healed.  If you had a chest procedure, you may shower when you return home unless specifically instructed not to by your discharging practitioner.             - DO NOT scrub incision; pat dry with a towel.             - DO NOT apply any lotions, oils, powders to the incision.              - No tub baths / swimming for at least 2 weeks.  If you notice any fevers, chills, increased pain, swelling, bleeding or pus, please contact your doctor.   ADDITIONAL INFORMATION  If you are going to have an upcoming dental procedure, please contact our office as you will require antibiotics ahead of time to prevent infection on your heart valve.    If you have any questions or concerns you can call the structural heart phone during normal business hours 8am-4pm. If you have an urgent need after hours or weekends please call [phone] to talk to the on call provider for general cardiology. If you have an emergency that requires immediate attention, please call 911.    After TAVR Checklist  Check  Test Description  Follow up appointment in 1-2 weeks  You will see our structural heart physician assistant, Nell Range. Your incision sites will be checked and you will be cleared to drive and resume all normal activities if you are doing well.    1 month echo and follow up  You will have an echo to check on your new heart valve and be seen back in the office by Nell Range. Many times the echo is not read by your appointment time, but Joellen Jersey will call you later that day or the following day to report your results.  Follow up with your primary cardiologist You will need to be seen by your primary cardiologist in the following 3-6 months after your 1 month appointment in the valve clinic. Often times your Plavix or Aspirin will be discontinued during this time, but this is decided on a case by case basis.   1 year echo and follow up You will have another echo to check on your heart valve after 1 year and be seen back in the  office by Nell Range. This your last structural heart visit.  Bacterial endocarditis prophylaxis  You will have to take antibiotics for the rest of your life before all dental procedures (even teeth cleanings) to protect your heart valve. Antibiotics are also required  before some surgeries. Please check with your cardiologist before scheduling any surgeries. Also, please make sure to tell us if you have a penicillin allergy as you will require an alternative antibiotic.   If the dressing is still on your incision site when you go home, remove it on the third day after your surgery date. Remove dressing if it begins to fall off, or if it is dirty or damaged before the third day.   Complete by: As directed    If the dressing is still on your incision site when you go home, remove it on the third day after your surgery date. Remove dressing if it begins to fall off, or if it is dirty or damaged before the third day.   Complete by: As directed    Increase activity slowly   Complete by: As directed    Increase activity slowly   Complete by: As directed        Discharge Medications   Allergies as of 04/29/2021       Reactions   Codeine Other (See Comments)   Makes him feel goofy   Morphine Other (See Comments)   Nightmares and felt bad        Medication List     STOP taking these medications    acetaminophen 500 MG tablet Commonly known as: TYLENOL   clopidogrel 75 MG tablet Commonly known as: PLAVIX       TAKE these medications    amLODipine 5 MG tablet Commonly known as: NORVASC Take 1 tablet (5 mg total) by mouth daily.   apixaban 2.5 MG Tabs tablet Commonly known as: Eliquis Take 1 tablet (2.5 mg total) by mouth 2 (two) times daily.   aspirin 81 MG chewable tablet Chew 1 tablet (81 mg total) by mouth daily. Start taking on: April 30, 2021   blood glucose meter kit and supplies Kit Dispense based on patient and insurance preference. Use up to four times daily as directed. (FOR ICD-9 250.00, 250.01).   carvedilol 6.25 MG tablet Commonly known as: COREG TAKE 1 TABLET (6.25 MG TOTAL) BY MOUTH 2 (TWO) TIMES DAILY WITH A MEAL.   furosemide 40 MG tablet Commonly known as: LASIX Take 1 tablet (40 mg total) by mouth in the morning  and at bedtime.   hydrocortisone 2.5 % cream Apply 1 application topically daily as needed (facial breakouts).   ICAPS AREDS 2 PO Take 1 capsule by mouth daily.   LUBRICATING EYE DROPS OP Place 1 drop into both eyes daily as needed (dry eyes).   nitroGLYCERIN 0.4 MG SL tablet Commonly known as: NITROSTAT Place 1 tablet (0.4 mg total) under the tongue every 5 (five) minutes x 3 doses as needed for chest pain (if no relief after 3rd dose, proceed to the ED for an evaluation).   spironolactone 25 MG tablet Commonly known as: ALDACTONE Take 1 tablet (25 mg total) by mouth daily.   THERAWORX RELIEF EX Apply 1 application topically daily as needed (knee pain).       ASK your doctor about these medications    albuterol 108 (90 Base) MCG/ACT inhaler Commonly known as: VENTOLIN HFA Inhale 2 puffs into the lungs every 6 (six) hours as needed.   budesonide-formoterol  160-4.5 MCG/ACT inhaler Commonly known as: Symbicort Inhale 2 puffs into the lungs in the morning and at bedtime.   docusate sodium 100 MG capsule Commonly known as: Stool Softener Take 1 capsule (100 mg total) by mouth 2 (two) times daily as needed for mild constipation.   FLUoxetine 10 MG capsule Commonly known as: PROZAC TAKE 1 CAPSULE BY MOUTH EVERY DAY   Januvia 50 MG tablet Generic drug: sitaGLIPtin TAKE 1 TABLET BY MOUTH EVERY DAY   pantoprazole 40 MG tablet Commonly known as: PROTONIX TAKE 1 TABLET BY MOUTH EVERY DAY   rosuvastatin 20 MG tablet Commonly known as: CRESTOR TAKE 1 TABLET BY MOUTH EVERY DAY               Discharge Care Instructions  (From admission, onward)           Start     Ordered   04/29/21 0000  If the dressing is still on your incision site when you go home, remove it on the third day after your surgery date. Remove dressing if it begins to fall off, or if it is dirty or damaged before the third day.        04/29/21 1206   04/29/21 0000  If the dressing is still on  your incision site when you go home, remove it on the third day after your surgery date. Remove dressing if it begins to fall off, or if it is dirty or damaged before the third day.        04/29/21 1210            Outstanding Labs/Studies   None   Duration of Discharge Encounter   Greater than 30 minutes including physician time.  Signed, Kathyrn Drown, NP 04/29/2021, 12:10 PM   I have personally seen and examined this patient. I agree with the assessment and plan as outlined above.  See my full note from this am. Discharge home today.   Lauree Chandler 04/29/2021 12:21 PM

## 2021-04-30 ENCOUNTER — Telehealth: Payer: Self-pay

## 2021-04-30 MED FILL — Potassium Chloride Inj 2 mEq/ML: INTRAVENOUS | Qty: 40 | Status: AC

## 2021-04-30 MED FILL — Magnesium Sulfate Inj 50%: INTRAMUSCULAR | Qty: 10 | Status: AC

## 2021-04-30 MED FILL — Heparin Sodium (Porcine) Inj 1000 Unit/ML: INTRAMUSCULAR | Qty: 30 | Status: AC

## 2021-04-30 NOTE — Telephone Encounter (Signed)
TOC call attempted.  The pt's son answered the phone due to the pt having company.  He states that the pt is doing well and he will have the pt return my call.

## 2021-05-01 NOTE — Telephone Encounter (Signed)
Patient contacted regarding discharge from [facility] on 04/29/2021.  Patient understands to follow up with provider Nell Range PA-C on 05/13/2021 at 3:30 PM at [facility] office. Patient understands discharge instructions? yes Patient understands medications and regiment? yes Patient understands to bring all medications to this visit? Yes  The pt feels tired and his wife notes that he seems "down or depressed". She wonders if this could be related to the anesthesia and medications that the pt received during surgery. I advised her that the medications could be contributing to fatigue and feelings of depression.  At this time the pt is 3 days post TAVR and that we should continue with observation and see if the pt has improvement over time with these symptoms.  [name] agreed with plan.

## 2021-05-05 ENCOUNTER — Telehealth: Payer: Self-pay

## 2021-05-05 NOTE — Telephone Encounter (Signed)
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 The pt is unsure what he can take since having TAVR. Prior to surgery the pt took Aleve as needed and this gave him relief.  I advised that the pt could take Aleve but to use this sparingly and not on a regular basis.  The pt can also use Voltaren gel if needed.  Discussed with Nell Range PA-C and she agreed with plan.

## 2021-05-12 NOTE — Progress Notes (Signed)
HEART AND [place]                                     Cardiology Office Note:    Date:  05/14/2021   ID:  [name], DOB [birth year], MRN [id number]  PCP:  [name], MD  [facility] Cardiologist:  [name], MD / Dr. Angelena Form & Dr. [name]  (TAVR) [place] Electrophysiologist:  None   Referring MD: [name], MD   [facility] s/p TAVR  History of Present Illness:    [name] is a 87 y.o. male with a hx of arthritis, COPD, LBBB, CAD s/p 4V CABG in 2009 by PVT and multiple subsequent PCIs, CKD stage 3-4, diabetes mellitus, GERD, HTN, HLD, carotid artery disease, chronic diastolic CHF with most recent admission in 09/2020, persistent atrial fibrillation on Eliquis, prostate cancer and severe LFLG aortic stenosis s/p TAVR (04/28/21) who presents to clinic for follow up.   Patient's cardiac history dates back to 2008 when he underwent multivessel coronary artery bypass grafting by Dr. Prescott Gum.  He has been followed intermittently ever since by Dr. Percival Spanish.  He has undergone multiple previous stent procedures for vein graft disease most recently in December 2021 when he underwent PCI of saphenous vein graft to the obtuse marginal branch of the left circumflex coronary artery.  Catheterization at that time revealed that all of the other bypass grafts remained widely patent and free of significant disease.  Patient has had aortic stenosis that has gradually progressed in severity. Recent follow-up echocardiogram performed Mar 12, 2021 revealed further progression of disease.  Left ventricular ejection fraction was estimated 45 to 50%.  The aortic valve is calcified with severe calcification, thickening, and restricted leaflet mobility involving all 3 leaflets.  Peak velocity across the aortic valve measured 2.7 m/s corresponding to mean transvalvular gradient estimated 21 mmHg but aortic valve area calculated only 0.63 cm with  DVI notably quite low at 0.20 and stroke-volume index only 21.   He was evaluated by the multidisciplinary valve team and underwent successful TAVR with a 29 mm Medtronic Evolut Pro + THV via the TF approach on 04/28/21. Post operative echo showed EF 40%, normally functioning TAVR with a mean gradient of 4 mmHg and trivial PVL. Plavix was discontinued and he was started on aspirin 81 mg daily and resumed on Eliquis. Of note, he was admitted the day before his case due to chest pain while being evaluated at preadmission testing.   Today the patient presents to clinic for follow up. He is here with his wife. He is feeling terrible. Has had worsening chest pain as well as weight gain, LE edema, orthopnea and PND. He also has had ongoing fevers that seem to be worse in the PM hours. The chest pain is typical of his angina but more frequent than previous. It is relieved by SL NTG. He almost called EMS for one episode but it resolved with SL NTG. He does note a slight cough but not s/s of UTI.    Past Medical History:  Diagnosis Date   Aortic stenosis, moderate 09/20/2020   Arthritis    CAD (coronary artery disease)    a. Cath September 2015 LIMA to the LAD patent, SVG to PDA patent, SVG to posterior lateral patent, SVG to OM with a 90% in-stent restenosis at an anastomotic lesion.  This was treated with angioplasty. b. cath 04/01/2015 95% ISR in SVG to OM treated with 2.75x24 Synergy DES postdilated to 3.61m, all other grafts patent   Cataract    surgery,B/L   CHF (congestive heart failure) (HCC)    Chronic kidney disease    nephrolithiasis   Diabetes mellitus    TYPE 2   GERD (gastroesophageal reflux disease)    Hernia    Hypercholesterolemia    Hypertension    Incontinence    hx  over 1 year,leaks without awareness   Other and unspecified diseases of appendix    PAF (paroxysmal atrial fibrillation) ([place]    in the setting of ischemia on 03/31/2015   Pancreatitis    [place]    prostate bx=Adenocarcinoam,gleason 3+4=7,PSA=6.75   Shortness of breath dyspnea    Ulcer    hx gastric    Past Surgical History:  Procedure Laterality Date   APPENDECTOMY     BACK SURGERY     CARDIAC CATHETERIZATION N/A 04/01/2015   Procedure: Left Heart Cath and Cors/Grafts Angiography;  Surgeon: JJettie Booze MD;  [facility];  Service: Cardiovascular;  Laterality: N/A;   CARDIAC CATHETERIZATION N/A 04/01/2015   Procedure: Coronary Stent Intervention;  Surgeon: JJettie Booze MD;  [place] [facility];  Service: Cardiovascular;  Laterality: N/A;   CARDIOVERSION N/A 11/01/2019   Procedure: CARDIOVERSION;  Surgeon: OGeralynn Rile MD;  [facility];  Service: Cardiovascular;  Laterality: N/A;   CARPAL TUNNEL RELEASE  2004   both hands   CORONARY ARTERY BYPASS GRAFT     CORONARY STENT INTERVENTION N/A 09/23/2020   Procedure: CORONARY STENT INTERVENTION;  Surgeon: CSherren Mocha MD;  [place];  Service: Cardiovascular;  Laterality: N/A;   CYSTOSCOPY  08/23/12   incomplete emoptying bladder   HERNIA REPAIR     incision and drainage of right chest abscess     INTRAOPERATIVE TRANSTHORACIC ECHOCARDIOGRAM Left 04/28/2021   Procedure: INTRAOPERATIVE TRANSTHORACIC ECHOCARDIOGRAM;  Surgeon: MBurnell Blanks MD;  [facility] [place] [place]  Service: Open Heart Surgery;  Laterality: Left;   LAPAROSCOPIC CHOLECYSTECTOMY  09/2009   LEFT HEART CATHETERIZATION WITH CORONARY ANGIOGRAM N/A 07/19/2014   Procedure: LEFT HEART CATHETERIZATION WITH CORONARY ANGIOGRAM;  Surgeon: DLeonie Man MD;  [facility];  Service: Cardiovascular;  Laterality: N/A;   RECTAL SURGERY     RIGHT/LEFT HEART CATH AND CORONARY/GRAFT ANGIOGRAPHY N/A 09/23/2020   Procedure: RIGHT/LEFT HEART CATH AND CORONARY/GRAFT ANGIOGRAPHY;  Surgeon: CSherren Mocha MD;  [place];  Service: Cardiovascular;  Laterality: N/A;   ROBOT ASSISTED  LAPAROSCOPIC RADICAL PROSTATECTOMY  2005   TRANSCATHETER AORTIC VALVE REPLACEMENT, TRANSFEMORAL Bilateral 04/28/2021   Procedure: TRANSCATHETER AORTIC VALVE REPLACEMENT, TRANSFEMORAL;  Surgeon: MBurnell Blanks MD;  [place]  Service: Open Heart Surgery;  Laterality: Bilateral;   ULTRASOUND GUIDANCE FOR VASCULAR ACCESS Bilateral 04/28/2021   Procedure: ULTRASOUND GUIDANCE FOR VASCULAR ACCESS;  Surgeon: MBurnell Blanks MD;  [facility] [place]  Service: Open Heart Surgery;  Laterality: Bilateral;    Current Medications: Current Meds  Medication Sig   albuterol (VENTOLIN HFA) 108 (90 Base) MCG/ACT inhaler Inhale 2 puffs into the lungs every 6 (six) hours as needed.   amLODipine (NORVASC) 5 MG tablet Take 1 tablet (5 mg total) by mouth daily.   apixaban (ELIQUIS) 2.5 MG TABS tablet Take 1 tablet (2.5 mg total) by mouth 2 (two) times daily.   aspirin 81  MG chewable tablet Chew 1 tablet (81 mg total) by mouth daily.   blood glucose meter kit and supplies KIT Dispense based on patient and insurance preference. Use up to four times daily as directed. (FOR ICD-9 250.00, 250.01).   budesonide-formoterol (SYMBICORT) 160-4.5 MCG/ACT inhaler Inhale 2 puffs into the lungs in the morning and at bedtime. (Patient taking differently: Inhale 2 puffs into the lungs as needed.)   Carboxymethylcellul-Glycerin (LUBRICATING EYE DROPS OP) Place 1 drop into both eyes daily as needed (dry eyes).   carvedilol (COREG) 6.25 MG tablet TAKE 1 TABLET (6.25 MG TOTAL) BY MOUTH 2 (TWO) TIMES DAILY WITH A MEAL.   docusate sodium (STOOL SOFTENER) 100 MG capsule Take 1 capsule (100 mg total) by mouth 2 (two) times daily as needed for mild constipation. (Patient taking differently: Take 100 mg by mouth daily.)   FLUoxetine (PROZAC) 10 MG capsule TAKE 1 CAPSULE BY MOUTH EVERY DAY   furosemide (LASIX) 40 MG tablet Take 1 tablet (40 mg total) by mouth in the morning and at bedtime.   furosemide (LASIX) 40 MG tablet  Take 2 tablets (80 mg total) by mouth 2 (two) times daily for 4 days. Then return to previous dose.   Homeopathic Products [facility] RELIEF EX) Apply 1 application topically daily as needed (knee pain).   hydrocortisone 2.5 % cream Apply 1 application topically daily as needed (facial breakouts).   isosorbide mononitrate (IMDUR) 30 MG 24 hr tablet Take 1 tablet (30 mg total) by mouth daily.   JANUVIA 50 MG tablet TAKE 1 TABLET BY MOUTH EVERY DAY   Multiple Vitamins-Minerals (ICAPS AREDS 2 PO) Take 1 capsule by mouth daily.   nitroGLYCERIN (NITROSTAT) 0.4 MG SL tablet Place 1 tablet (0.4 mg total) under the tongue every 5 (five) minutes x 3 doses as needed for chest pain (if no relief after 3rd dose, proceed to the ED for an evaluation).   pantoprazole (PROTONIX) 40 MG tablet TAKE 1 TABLET BY MOUTH EVERY DAY   potassium chloride SA (KLOR-CON) 20 MEQ tablet Take as directed. Take one tablet daily for 4 days.   rosuvastatin (CRESTOR) 20 MG tablet TAKE 1 TABLET BY MOUTH EVERY DAY   spironolactone (ALDACTONE) 25 MG tablet Take 1 tablet (25 mg total) by mouth daily.     Allergies:   Codeine and Morphine   Social History   Socioeconomic History   Marital status: Married    Spouse name: Not on file   Number of children: 2   Years of education: Not on file   Highest education level: Not on file  Occupational History    Employer: RETIRED    Comment: Retired  Tobacco Use   Smoking status: Former    Packs/day: 0.50    Years: 5.00    Pack years: 2.50    Types: Cigarettes    Quit date: 07/20/1972    Years since quitting: 48.8   Smokeless tobacco: Never   Tobacco comments:    quit s  Vaping Use   Vaping Use: Never used  Substance and Sexual Activity   Alcohol use: No   Drug use: No    Comment: quit smoking 40 years ago   Sexual activity: Never  Other Topics Concern   Not on file  Social History Narrative   Retired   Married      Social Determinants of Radio broadcast assistant  Strain: Low Risk    Difficulty of Paying Living Expenses: Not hard at [employer]  Insecurity: No Food Insecurity   Worried About Charity fundraiser in the Last Year: Never true   Ran Out of Food in the Last Year: Never true  Transportation Needs: No Transportation Needs   Lack of Transportation (Medical): No   Lack of Transportation (Non-Medical): No  Physical Activity: Insufficiently Active   Days of Exercise per Week: 3 days   Minutes of Exercise per Session: 30 min  Stress: No Stress Concern Present   Feeling of Stress : Not at all  Social Connections: Socially Integrated   Frequency of Communication with Friends and Family: More than three times a week   Frequency of Social Gatherings with Friends and Family: More than three times a week   Attends Religious Services: More than 4 times per year   Active Member of [employer] or Organizations: Yes   Attends Archivist Meetings: 1 to 4 times per year   Marital Status: Married     Family History: The patient's family history includes Cancer in his mother; Heart attack in his father.  ROS:   Please see the history of present illness.    All other systems reviewed and are negative.  EKGs/Labs/Other Studies Reviewed:    The following studies were reviewed today:    TAVR OPERATIVE NOTE     Date of Procedure:                04/28/2021   Preoperative Diagnosis:      Severe Aortic Stenosis   Postoperative Diagnosis:    Same   Procedure:        Transcatheter Aortic Valve Replacement - Percutaneous Right Transfemoral Approach             Medtronic CoreValve Evolut Pro Plus (size 29 mm, serial # [id number])              Co-Surgeons:                        Valentina Gu. [name], MD and Lauree Chandler, MD   Anesthesiologist:                  Myrtie Soman, MD   Echocardiographer:              Jenkins Rouge, MD   Pre-operative Echo Findings: Severe aortic stenosis Low normal left ventricular systolic function    Post-operative Echo Findings: Trivial paravalvular leak Unchanged left ventricular systolic function   _______________   Echo 05/12/21 IMPRESSIONS   1. Septal , apical and inferior wall hypokinesis . Left ventricular  ejection fraction, by estimation, is 40 to 45%. The left ventricle has  mildly decreased function. The left ventricle demonstrates regional wall  motion abnormalities (see scoring  diagram/findings for description). The left ventricular internal cavity  size was mildly dilated. There is moderate left ventricular hypertrophy.  Left ventricular diastolic parameters are indeterminate.   2. Right ventricular systolic function is normal. The right ventricular  size is normal. There is normal pulmonary artery systolic pressure.   3. Left atrial size was moderately dilated.   4. Right atrial size was mildly dilated.   5. The mitral valve is abnormal. Mild mitral valve regurgitation. No  evidence of mitral stenosis. Moderate mitral annular calcification.   6. Post TAVR 04/28/21 with 29 mm Medtronic Evolut Pro valve Trivial PVL  seen best on PSSA images 6:00 mean gradient 4 peak 6 mmHg AVR 2.8 cm2. The  aortic valve has been repaired/replaced. Aortic  valve regurgitation is not  visualized. No aortic stenosis  is present. Procedure Date: 0712/2022.   7. The inferior vena cava is normal in size with greater than 50%  respiratory variability, suggesting right atrial pressure of 3 mmHg.   EKG:  EKG is ordered today.  The ekg ordered today demonstrates afib with LBBB HR 74bpm  Recent Labs: 09/21/2020: TSH 2.809 04/27/2021: ALT 19; B Natriuretic Peptide 695.0 04/29/2021: Magnesium 1.8 05/13/2021: BUN 24; Creatinine, Ser 1.73; Hemoglobin 12.1; NT-Pro BNP 10,919; Platelets 233; Potassium 4.6; Sodium 137  Recent Lipid Panel    Component Value Date/Time   CHOL 97 (L) 01/01/2021 0848   TRIG 92 01/01/2021 0848   TRIG 306 (HH) 09/27/2006 1154   HDL 33 (L) 01/01/2021 0848   CHOLHDL 2.9  01/01/2021 0848   CHOLHDL 4 01/14/2020 0854   VLDL 26.4 01/14/2020 0854   LDLCALC 46 01/01/2021 0848   LDLDIRECT 143.3 11/23/2007 0000     Risk Assessment/Calculations:    CHA2DS2-VASc Score = 5  This indicates a 7.2% annual risk of stroke. The patient's score is based upon: CHF History: Yes HTN History: Yes Diabetes History: Yes Stroke History: No Vascular Disease History: No Age Score: 2 Gender Score: 0    Physical Exam:    VS:  BP 140/60   Pulse 68   Ht 5' 9" (1.753 m)   Wt 178 lb (80.7 kg)   SpO2 98%   BMI 26.29 kg/m     Wt Readings from Last 3 Encounters:  05/13/21 178 lb (80.7 kg)  04/29/21 171 lb 1.2 oz (77.6 kg)  04/27/21 176 lb 8 oz (80.1 kg)     GEN:  Well nourished, well developed in no acute distress HEENT: Normal NECK: No JVD; No carotid bruits LYMPHATICS: No lymphadenopathy CARDIAC: irreg irreg, no murmurs, rubs, gallops RESPIRATORY: decreased breath sounds at bases ABDOMEN: Soft, non-tender, non-distended MUSCULOSKELETAL:  No edema; No deformity  SKIN: Warm and dry.  Groin sites clear without hematoma or ecchymosis   NEUROLOGIC:  Alert and oriented x 3 PSYCHIATRIC:  Normal affect   ASSESSMENT:    1. S/P TAVR (transcatheter aortic valve replacement)   2. Essential hypertension   3. Stage 3 chronic kidney disease, unspecified whether stage 3a or 3b CKD ([place])   4. Coronary artery disease involving native heart with angina pectoris, unspecified vessel or lesion type ([place])   5. Persistent atrial fibrillation ([place])   6. Acute on chronic combined systolic and diastolic CHF (congestive heart failure) (HCC)    PLAN:    In order of problems listed above:  Severe AS s/p TAVR: doing okay 2 weeks out from TAVR. Groin sites healing well. ECG with no HAVB. Continue on Eliquis and aspirin. SBE prophylaxis discussed; he would like to hold off on any abx right now as he does not visit the dentist often.   COPD: stable.  CKD stage III: creat 1.75 at  discharge. Repeat BMET today showed creat 1.73.  CAD s/p CABG and subsequent PCIs: most recent cath showed continued patency of the LIMA to LAD, saphenous vein graft to left posterior lateral branch, and saphenous vein graft to right PDA. He underwent successful POBA to SVG--> OM for ISR of 2 layers of stent.  He completed 1 month of triple therapy and 6 months of plavix and eliquis. Switched to aspirin and eliquis during most recent admission. Continue medical therapy. Has had worsening chest pain which may be related to volume overload. ECG with no acute ischemic changes.  Plan to diurese and start imdur 31m daily.   Persistent atrial fibrillation: rate well controlled. Continue Coreg 3.255mBID and Eliquis 2.5 mg BID.    Acute on chronic diastolic CHF: weight up 8 lbs and has worsening LE edema and orthopnea. BNP today extremely elevated ~11K. Increase lasix from 40103mID to 72m27mD and add 20 meq Kdur x 4 days, then go back to normal dosing. I will see him back in the office Friday to assess his volume status before the weekend and make sure he does not require admission  Fevers: I think this is likely related to a viral syndrome. Has had 6 days of fevers. Afebrile in the office today. CBC today shows mildly elevated white counts, which looks to be around his baseline. He does have a mild productive cough but wants to defer CXR for now. If he continues to have fever and cough will check a CXR. Also will consider blood cultures if CXR negative. Close follow up in the office Friday.   Total time spent with patient was over 40 minutes which included evaluating patient, reviewing record and coordinating care. Face to face time >50%.    Medication Adjustments/Labs and Tests Ordered: Current medicines are reviewed at length with the patient today.  Concerns regarding medicines are outlined above.  Orders Placed This Encounter  Procedures   Basic metabolic panel   CBC   Pro b natriuretic peptide  (BNP)   EKG 12-Lead    Meds ordered this encounter  Medications   furosemide (LASIX) 40 MG tablet    Sig: Take 2 tablets (80 mg total) by mouth 2 (two) times daily for 4 days. Then return to previous dose.    Dispense:  16 tablet    Refill:  0   potassium chloride SA (KLOR-CON) 20 MEQ tablet    Sig: Take as directed. Take one tablet daily for 4 days.    Dispense:  10 tablet    Refill:  1   isosorbide mononitrate (IMDUR) 30 MG 24 hr tablet    Sig: Take 1 tablet (30 mg total) by mouth daily.    Dispense:  90 tablet    Refill:  3     Patient Instructions  Medication Instructions:  Your physician has recommended you make the following change in your medication:   1.) increase furosemide (Lasix) to 80 mg twice a day for 4 days, then return to previous dose of 40 mg twice a day 2.) start potassium chloride 20 meq - one tablet daily for 4 days while taking higher dose of Lasix 3.) start isosorbide (IMDUR) 30 mg daily  *If you need a refill on your cardiac medications before your next appointment, please call your pharmacy*   Lab Work: Today: cbc, bmet, bnp   Testing/Procedures: none   Follow-Up: As planned   Other Instructions     Signed, KathAngelena Form-C  05/14/2021 9:58 AM    [facility]

## 2021-05-13 ENCOUNTER — Encounter: Payer: Self-pay | Admitting: Physician Assistant

## 2021-05-13 ENCOUNTER — Other Ambulatory Visit: Payer: Self-pay

## 2021-05-13 ENCOUNTER — Ambulatory Visit: Payer: Medicare PPO | Admitting: Physician Assistant

## 2021-05-13 VITALS — BP 140/60 | HR 68 | Ht 69.0 in | Wt 178.0 lb

## 2021-05-13 DIAGNOSIS — I2510 Atherosclerotic heart disease of native coronary artery without angina pectoris: Secondary | ICD-10-CM | POA: Diagnosis not present

## 2021-05-13 DIAGNOSIS — I25119 Atherosclerotic heart disease of native coronary artery with unspecified angina pectoris: Secondary | ICD-10-CM | POA: Diagnosis not present

## 2021-05-13 DIAGNOSIS — I4819 Other persistent atrial fibrillation: Secondary | ICD-10-CM

## 2021-05-13 DIAGNOSIS — N183 Chronic kidney disease, stage 3 unspecified: Secondary | ICD-10-CM | POA: Diagnosis not present

## 2021-05-13 DIAGNOSIS — I5043 Acute on chronic combined systolic (congestive) and diastolic (congestive) heart failure: Secondary | ICD-10-CM

## 2021-05-13 DIAGNOSIS — I10 Essential (primary) hypertension: Secondary | ICD-10-CM | POA: Diagnosis not present

## 2021-05-13 DIAGNOSIS — Z952 Presence of prosthetic heart valve: Secondary | ICD-10-CM | POA: Diagnosis not present

## 2021-05-13 DIAGNOSIS — I5032 Chronic diastolic (congestive) heart failure: Secondary | ICD-10-CM | POA: Diagnosis not present

## 2021-05-13 MED ORDER — FUROSEMIDE 40 MG PO TABS: 80.0000 mg | ORAL_TABLET | Freq: Two times a day (BID) | ORAL | 0 refills | Status: DC

## 2021-05-13 MED ORDER — ISOSORBIDE MONONITRATE ER 30 MG PO TB24: 30.0000 mg | ORAL_TABLET | Freq: Every day | ORAL | 3 refills | Status: DC

## 2021-05-13 MED ORDER — POTASSIUM CHLORIDE CRYS ER 20 MEQ PO TBCR: EXTENDED_RELEASE_TABLET | ORAL | 1 refills | Status: DC

## 2021-05-13 NOTE — Patient Instructions (Signed)
Medication Instructions:  Your physician has recommended you make the following change in your medication:   1.) increase furosemide (Lasix) to 80 mg twice a day for 4 days, then return to previous dose of 40 mg twice a day 2.) start potassium chloride 20 meq - one tablet daily for 4 days while taking higher dose of Lasix 3.) start isosorbide (IMDUR) 30 mg daily  *If you need a refill on your cardiac medications before your next appointment, please call your pharmacy*   Lab Work: Today: cbc, bmet, bnp   Testing/Procedures: none   Follow-Up: As planned   Other Instructions

## 2021-05-14 LAB — CBC
Hematocrit: 36.2 % — ABNORMAL LOW (ref 37.5–51.0)
Hemoglobin: 12.1 g/dL — ABNORMAL LOW (ref 13.0–17.7)
MCH: 27.7 pg (ref 26.6–33.0)
MCHC: 33.4 g/dL (ref 31.5–35.7)
MCV: 83 fL (ref 79–97)
Platelets: 233 10*3/uL (ref 150–450)
RBC: 4.37 x10E6/uL (ref 4.14–5.80)
RDW: 15 % (ref 11.6–15.4)
WBC: 11.2 10*3/uL — ABNORMAL HIGH (ref 3.4–10.8)

## 2021-05-14 LAB — BASIC METABOLIC PANEL WITH GFR
BUN/Creatinine Ratio: 14 (ref 10–24)
BUN: 24 mg/dL (ref 8–27)
CO2: 24 mmol/L (ref 20–29)
Calcium: 9.7 mg/dL (ref 8.6–10.2)
Chloride: 98 mmol/L (ref 96–106)
Creatinine, Ser: 1.73 mg/dL — ABNORMAL HIGH (ref 0.76–1.27)
Glucose: 188 mg/dL — ABNORMAL HIGH (ref 65–99)
Potassium: 4.6 mmol/L (ref 3.5–5.2)
Sodium: 137 mmol/L (ref 134–144)
eGFR: 38 mL/min/{1.73_m2} — ABNORMAL LOW

## 2021-05-14 LAB — PRO B NATRIURETIC PEPTIDE: NT-Pro BNP: 10919 pg/mL — ABNORMAL HIGH (ref 0–486)

## 2021-05-14 NOTE — Progress Notes (Signed)
HEART AND [place]                                     Cardiology Office Note:    Date:  05/15/2021   ID:  [name], DOB 29-Jun-1933, MRN [id number]  PCP:  Shawn Post, [name]  [facility] Cardiologist:  Shawn Breeding, [name] / Dr. Angelena [name] & Dr. Roxy [name]  (TAVR) [place] Electrophysiologist:  None   Referring [name]: Shawn Post, [name]   Close follow up of CHF and chest pain.   History of Present Illness:    [name] is a 87 y.o. male with a hx of arthritis, COPD, LBBB, CAD s/p 4V CABG in 2009 by PVT and multiple subsequent PCIs, CKD stage 3-4, diabetes mellitus, GERD, HTN, HLD, carotid artery disease, chronic diastolic CHF with most recent admission in 09/2020, persistent atrial fibrillation on Eliquis, prostate cancer and severe LFLG aortic stenosis s/p TAVR (04/28/21) who presents to clinic for follow up.   Patient's cardiac history dates back to 2008 when he underwent multivessel coronary artery bypass grafting by Dr. Prescott [name].  He has been followed intermittently ever since by [name].  He has undergone multiple previous stent procedures for vein graft disease most recently in December 2021 when he underwent PCI of saphenous vein graft to the obtuse marginal branch of the left circumflex coronary artery.  Catheterization at that time revealed that all of the other bypass grafts remained widely patent and free of significant disease.  Patient has had aortic stenosis that has gradually progressed in severity. Recent follow-up echocardiogram performed Mar 12, 2021 revealed further progression of disease.  Left ventricular ejection fraction was estimated 45 to 50%.  The aortic valve is calcified with severe calcification, thickening, and restricted leaflet mobility involving all 3 leaflets.  Peak velocity across the aortic valve measured 2.7 m/s corresponding to mean transvalvular gradient estimated 21 mmHg but aortic valve area  calculated only 0.63 cm with DVI notably quite low at 0.20 and stroke-volume index only 21.   He was evaluated by the multidisciplinary valve team and underwent successful TAVR with a 29 mm Medtronic Evolut Pro + THV via the TF approach on 04/28/21. [name] operative echo showed EF 40%, normally functioning TAVR with a mean gradient of 4 mmHg and trivial PVL. Plavix was discontinued and he was started on aspirin 81 mg daily and resumed on Eliquis. Of note, he was admitted the day before his case due to chest pain while being evaluated at preadmission testing.   I saw him for [name] hospital follow up on 7/27 and he complained of worsening chest pain as well as fevers, weight gain, LE edema, orthopnea, cough and PND. He was noted to be volume overloaded on exam and up 8 lbs. Follow up labs showed creat 1.7, nt BNP ~11K, WBC 11.2, Hg 12.1. I increased his lasix from $RemoveBe'40mg'tsLCWcCQj$  BID to 80 mg BID and started him on Imdur. He was afebrile in the office. 02 sats were 98%.  Today he presents to clinic for close follow up. Here with wife. Doing  a littler better. Weight has gone down. Still having shortness of breath and chest pain as well as orthopnea and PND. Mild dizziness but no syncope. This is chronic and sounds orthostatic.   Past Medical History:  Diagnosis Date   Aortic stenosis, moderate 09/20/2020  Arthritis    CAD (coronary artery disease)    a. Cath September 2015 LIMA to the LAD patent, SVG to PDA patent, SVG to posterior lateral patent, SVG to OM with a 90% in-stent restenosis at an anastomotic lesion. This was treated with angioplasty. b. cath 04/01/2015 95% ISR in SVG to OM treated with 2.75x24 Synergy DES postdilated to 3.46mm, all other grafts patent   Cataract    surgery,B/L   CHF (congestive heart failure) (HCC)    Chronic kidney disease    nephrolithiasis   Diabetes mellitus    TYPE 2   GERD (gastroesophageal reflux disease)    Hernia    Hypercholesterolemia    Hypertension    Incontinence     hx  over 1 year,leaks without awareness   Other and unspecified diseases of appendix    PAF (paroxysmal atrial fibrillation) ([place])    in the setting of ischemia on 03/31/2015   Pancreatitis    Prostate CA ([place]) 03/01/04   prostate bx=Adenocarcinoam,gleason 3+4=7,PSA=6.75   Shortness of breath dyspnea    Ulcer    hx gastric    Past Surgical History:  Procedure Laterality Date   APPENDECTOMY     BACK SURGERY     CARDIAC CATHETERIZATION N/A 04/01/2015   Procedure: Left Heart Cath and Cors/Grafts Angiography;  Surgeon: [name];  Location: [place] [facility];  Service: Cardiovascular;  Laterality: N/A;   CARDIAC CATHETERIZATION N/A 04/01/2015   Procedure: Coronary Stent Intervention;  Surgeon: [name];  Location: [place] [facility];  Service: Cardiovascular;  Laterality: N/A;   CARDIOVERSION N/A 11/01/2019   Procedure: CARDIOVERSION;  Surgeon: Shawn Rile, [name];  Location: [facility] ENDOSCOPY;  Service: Cardiovascular;  Laterality: N/A;   CARPAL TUNNEL RELEASE  2004   both hands   CORONARY ARTERY BYPASS GRAFT     CORONARY STENT INTERVENTION N/A 09/23/2020   Procedure: CORONARY STENT INTERVENTION;  Surgeon: Shawn Mocha, [name];  Location: [place] [facility];  Service: Cardiovascular;  Laterality: N/A;   CYSTOSCOPY  08/23/12   incomplete emoptying bladder   HERNIA REPAIR     incision and drainage of right chest abscess     INTRAOPERATIVE TRANSTHORACIC ECHOCARDIOGRAM Left 04/28/2021   Procedure: INTRAOPERATIVE TRANSTHORACIC ECHOCARDIOGRAM;  Surgeon: Shawn Blanks, [name];  Location: [place];  Service: Open Heart Surgery;  Laterality: Left;   LAPAROSCOPIC CHOLECYSTECTOMY  09/2009   LEFT HEART CATHETERIZATION WITH CORONARY ANGIOGRAM N/A 07/19/2014   Procedure: LEFT HEART CATHETERIZATION WITH CORONARY ANGIOGRAM;  Surgeon: Shawn Man, [name];  Location: [facility] CATH LAB;  Service: Cardiovascular;  Laterality: N/A;   RECTAL SURGERY     RIGHT/LEFT HEART CATH AND  CORONARY/GRAFT ANGIOGRAPHY N/A 09/23/2020   Procedure: RIGHT/LEFT HEART CATH AND CORONARY/GRAFT ANGIOGRAPHY;  Surgeon: Shawn Mocha, [name];  Location: [place] [facility];  Service: Cardiovascular;  Laterality: N/A;   ROBOT ASSISTED LAPAROSCOPIC RADICAL PROSTATECTOMY  2005   TRANSCATHETER AORTIC VALVE REPLACEMENT, TRANSFEMORAL Bilateral 04/28/2021   Procedure: TRANSCATHETER AORTIC VALVE REPLACEMENT, TRANSFEMORAL;  Surgeon: Shawn Blanks, [name];  Location: [place];  Service: Open Heart Surgery;  Laterality: Bilateral;   ULTRASOUND GUIDANCE FOR VASCULAR ACCESS Bilateral 04/28/2021   Procedure: ULTRASOUND GUIDANCE FOR VASCULAR ACCESS;  Surgeon: Shawn Blanks, [name];  Location: [place];  Service: Open Heart Surgery;  Laterality: Bilateral;    Current Medications: Current Meds  Medication Sig   albuterol (VENTOLIN HFA) 108 (90 Base) MCG/ACT inhaler Inhale 2 puffs into the lungs every 6 (six) hours as  needed.   amLODipine (NORVASC) 5 MG tablet Take 1 tablet (5 mg total) by mouth daily.   apixaban (ELIQUIS) 2.5 MG TABS tablet Take 1 tablet (2.5 mg total) by mouth 2 (two) times daily.   aspirin 81 MG chewable tablet Chew 1 tablet (81 mg total) by mouth daily.   blood glucose meter kit and supplies KIT Dispense based on patient and insurance preference. Use up to four times daily as directed. (FOR ICD-9 250.00, 250.01).   budesonide-formoterol (SYMBICORT) 160-4.5 MCG/ACT inhaler Inhale 2 puffs into the lungs in the morning and at bedtime.   Carboxymethylcellul-Glycerin (LUBRICATING EYE DROPS OP) Place 1 drop into both eyes daily as needed (dry eyes).   carvedilol (COREG) 6.25 MG tablet TAKE 1 TABLET (6.25 MG TOTAL) BY MOUTH 2 (TWO) TIMES DAILY WITH A MEAL.   docusate sodium (STOOL SOFTENER) 100 MG capsule Take 1 capsule (100 mg total) by mouth 2 (two) times daily as needed for mild constipation.   FLUoxetine (PROZAC) 10 MG capsule TAKE 1 CAPSULE BY MOUTH EVERY DAY   furosemide (LASIX) 40 MG tablet  Take 1 tablet (40 mg total) by mouth in the morning and at bedtime.   furosemide (LASIX) 40 MG tablet Take 2 tablets (80 mg total) by mouth 2 (two) times daily for 4 days. Then return to previous dose.   Homeopathic Products [facility] RELIEF EX) Apply 1 application topically daily as needed (knee pain).   hydrocortisone 2.5 % cream Apply 1 application topically daily as needed (facial breakouts).   isosorbide mononitrate (IMDUR) 60 MG 24 hr tablet Take 1 tablet (60 mg total) by mouth daily.   JANUVIA 50 MG tablet TAKE 1 TABLET BY MOUTH EVERY DAY   Multiple Vitamins-Minerals (ICAPS AREDS 2 PO) Take 1 capsule by mouth daily.   nitroGLYCERIN (NITROSTAT) 0.4 MG SL tablet Place 1 tablet (0.4 mg total) under the tongue every 5 (five) minutes x 3 doses as needed for chest pain (if no relief after 3rd dose, proceed to the ED for an evaluation).   pantoprazole (PROTONIX) 40 MG tablet TAKE 1 TABLET BY MOUTH EVERY DAY   potassium chloride SA (KLOR-CON) 20 MEQ tablet Take as directed. Take one tablet daily for 4 days.   rosuvastatin (CRESTOR) 20 MG tablet TAKE 1 TABLET BY MOUTH EVERY DAY   spironolactone (ALDACTONE) 25 MG tablet Take 1 tablet (25 mg total) by mouth daily.   [DISCONTINUED] isosorbide mononitrate (IMDUR) 30 MG 24 hr tablet Take 1 tablet (30 mg total) by mouth daily.     Allergies:   Codeine and Morphine   Social History   Socioeconomic History   Marital status: Married    Spouse name: Not on file   Number of children: 2   Years of education: Not on file   Highest education level: Not on file  Occupational History    Employer: RETIRED    Comment: Retired  Tobacco Use   Smoking status: Former    Packs/day: 0.50    Years: 5.00    Pack years: 2.50    Types: Cigarettes    Quit date: 07/20/1972    Years since quitting: 48.8   Smokeless tobacco: Never   Tobacco comments:    quit s  Vaping Use   Vaping Use: Never used  Substance and Sexual Activity   Alcohol use: No   Drug use:  No    Comment: quit smoking 40 years ago   Sexual activity: Never  Other Topics Concern   Not on  file  Social History Narrative   Retired   Married      Investment banker, operational of Radio broadcast assistant Strain: Low Risk    Difficulty of Paying Living Expenses: Not hard at all  Food Insecurity: No Food Insecurity   Worried About Charity fundraiser in the Last Year: Never true   Arboriculturist in the Last Year: Never true  Transportation Needs: No Transportation Needs   Lack of Transportation (Medical): No   Lack of Transportation (Non-Medical): No  Physical Activity: Insufficiently Active   Days of Exercise per Week: 3 days   Minutes of Exercise per Session: 30 min  Stress: No Stress Concern Present   Feeling of Stress : Not at all  Social Connections: Socially Integrated   Frequency of Communication with Friends and Family: More than three times a week   Frequency of Social Gatherings with Friends and Family: More than three times a week   Attends Religious Services: More than 4 times per year   Active Member of [employer] or Organizations: Yes   Attends Archivist Meetings: 1 to 4 times per year   Marital Status: Married     Family History: The patient's family history includes Cancer in his mother; Heart attack in his father.  ROS:   Please see the history of present illness.    All other systems reviewed and are negative.  EKGs/Labs/Other Studies Reviewed:    The following studies were reviewed today:    TAVR OPERATIVE NOTE     Date of Procedure:                04/28/2021   Preoperative Diagnosis:      Severe Aortic Stenosis   Postoperative Diagnosis:    Same   Procedure:        Transcatheter Aortic Valve Replacement - Percutaneous Right Transfemoral Approach             Medtronic CoreValve Evolut Pro Plus (size 29 mm, serial # [id number])              Co-Surgeons:                        Valentina Gu. Shawn Manns, [name] and Lauree Chandler, [name]    Anesthesiologist:                  Myrtie Soman, [name]   Echocardiographer:              Jenkins Rouge, [name]   Pre-operative Echo Findings: Severe aortic stenosis Low normal left ventricular systolic function   [name]-operative Echo Findings: Trivial paravalvular leak Unchanged left ventricular systolic function   _______________   Echo 05/12/21 IMPRESSIONS   1. Septal , apical and inferior wall hypokinesis . Left ventricular  ejection fraction, by estimation, is 40 to 45%. The left ventricle has  mildly decreased function. The left ventricle demonstrates regional wall  motion abnormalities (see scoring  diagram/findings for description). The left ventricular internal cavity  size was mildly dilated. There is moderate left ventricular hypertrophy.  Left ventricular diastolic parameters are indeterminate.   2. Right ventricular systolic function is normal. The right ventricular  size is normal. There is normal pulmonary artery systolic pressure.   3. Left atrial size was moderately dilated.   4. Right atrial size was mildly dilated.   5. The mitral valve is abnormal. Mild mitral valve regurgitation. No  evidence of mitral stenosis. Moderate mitral  annular calcification.   6. [name] TAVR 04/28/21 with 29 mm Medtronic Evolut Pro valve Trivial PVL  seen best on PSSA images 6:00 mean gradient 4 peak 6 mmHg AVR 2.8 cm2. The  aortic valve has been repaired/replaced. Aortic valve regurgitation is not  visualized. No aortic stenosis  is present. Procedure Date: 0712/2022.   7. The inferior vena cava is normal in size with greater than 50%  respiratory variability, suggesting right atrial pressure of 3 mmHg.   EKG:  EKG is NOT ordered today.  Recent Labs: 09/21/2020: TSH 2.809 04/27/2021: ALT 19; B Natriuretic Peptide 695.0 04/29/2021: Magnesium 1.8 05/13/2021: BUN 24; Creatinine, Ser 1.73; Hemoglobin 12.1; NT-Pro BNP 10,919; Platelets 233; Potassium 4.6; Sodium 137  Recent Lipid Panel     Component Value Date/Time   CHOL 97 (L) 01/01/2021 0848   TRIG 92 01/01/2021 0848   TRIG 306 (HH) 09/27/2006 1154   HDL 33 (L) 01/01/2021 0848   CHOLHDL 2.9 01/01/2021 0848   CHOLHDL 4 01/14/2020 0854   VLDL 26.4 01/14/2020 0854   LDLCALC 46 01/01/2021 0848   LDLDIRECT 143.3 11/23/2007 0000     Risk Assessment/Calculations:    CHA2DS2-VASc Score = 5  This indicates a 7.2% annual risk of stroke. The patient's score is based upon: CHF History: Yes HTN History: Yes Diabetes History: Yes Stroke History: No Vascular Disease History: No Age Score: 2 Gender Score: 0    Physical Exam:    VS:  BP (!) 122/54   Pulse 68   Ht $R'5\' 9"'ER$  (1.753 m)   Wt 174 lb (78.9 kg)   SpO2 95%   BMI 25.70 kg/m     Wt Readings from Last 3 Encounters:  05/15/21 174 lb (78.9 kg)  05/13/21 178 lb (80.7 kg)  04/29/21 171 lb 1.2 oz (77.6 kg)     GEN:  Well nourished, well developed in no acute distress HEENT: Normal NECK: mild JVD LYMPHATICS: No lymphadenopathy CARDIAC: irreg irreg, no murmurs, rubs, gallops RESPIRATORY: decreased breath sounds at bases, crackles in all lung fields ABDOMEN: Soft, non-tender, non-distended MUSCULOSKELETAL:  1+ bilateral LE edema improved from Wednesday.  SKIN: Warm and dry.  NEUROLOGIC:  Alert and oriented x 3 PSYCHIATRIC:  Normal affect   ASSESSMENT:    1. Medication management   2. CAD in native artery   3. Chest pain, unspecified type   4. Stage 3 chronic kidney disease, unspecified whether stage 3a or 3b CKD ([place])   5. Acute on chronic combined systolic and diastolic CHF (congestive heart failure) ([place])   6. Fever, unspecified fever cause     PLAN:    In order of problems listed above:  Chest pain: still having chest pain which is chronic but mildly worse from baseline. ECG from Wednesday non ischemic and started on imdur 30 mg daily. He has not had significant improvement so I will increase Imdur to $Remove'60mg'fBEivya$  daily.   CKD stage III: creat has been  stable ~1.7. Will repeat BMET today with increase in lasix.   Acute on chronic diastolic CHF: still overloaded but improving. Weight down 4 lbs and LE edema better. Still has crackles in lungs and has dyspnea and orthopnea. Continue on lasix 80 mg BID through the weekend. I will check on him Monday to see how his symptoms are progressing.   Fevers: no fever currently but had a 99.6 deg F last night. If fevers continue into next week, I will get a CXR and blood cultures.    Medication Adjustments/Labs  and Tests Ordered: Current medicines are reviewed at length with the patient today.  Concerns regarding medicines are outlined above.  Orders Placed This Encounter  Procedures   Basic Metabolic Panel (BMET)     Meds ordered this encounter  Medications   isosorbide mononitrate (IMDUR) 60 MG 24 hr tablet    Sig: Take 1 tablet (60 mg total) by mouth daily.    Dispense:  90 tablet    Refill:  3      Patient Instructions  Medication Instructions:  Stay on the lasix 80 mg through Sunday and Valetta Fuller will call you Monday for an update   Increase your Imdur to 60 mg daily  *If you need a refill on your cardiac medications before your next appointment, please call your pharmacy*   Lab Work: BMET  If you have labs (blood work) drawn today and your tests are completely normal, you will receive your results only by: [place] (if you have MyChart) OR A paper copy in the mail If you have any lab test that is abnormal or we need to change your treatment, we will call you to review the results.   Testing/Procedures: none   Follow-Up:  With [facility] Wednesday May 20, 2021 at 1 pm  At [facility], you and your health needs are our priority.  As part of our continuing mission to provide you with exceptional heart care, we have created designated Provider Care Teams.  These Care Teams include your primary Cardiologist (physician) and Advanced Practice Providers (APPs -  Physician  Assistants and Nurse Practitioners) who all work together to provide you with the care you need, when you need it.  We recommend signing up for the patient portal called "MyChart".  Sign up information is provided on this After Visit Summary.  MyChart is used to connect with patients for Virtual Visits (Telemedicine).  Patients are able to view lab/test results, encounter notes, upcoming appointments, etc.  Non-urgent messages can be sent to your provider as well.   To learn more about what you can do with MyChart, go to [email or web address].    Your next appointment:      Signed, Shawn Form, PA-C  05/15/2021 11:55 AM    [place]

## 2021-05-14 NOTE — Addendum Note (Signed)
Addended by: Angelena Form R on: 05/14/2021 10:30 AM   Modules accepted: Level of Service

## 2021-05-15 ENCOUNTER — Ambulatory Visit: Payer: Medicare PPO | Admitting: Physician Assistant

## 2021-05-15 ENCOUNTER — Other Ambulatory Visit: Payer: Self-pay

## 2021-05-15 ENCOUNTER — Encounter: Payer: Self-pay | Admitting: Physician Assistant

## 2021-05-15 VITALS — BP 122/54 | HR 68 | Ht 69.0 in | Wt 174.0 lb

## 2021-05-15 DIAGNOSIS — R079 Chest pain, unspecified: Secondary | ICD-10-CM | POA: Diagnosis not present

## 2021-05-15 DIAGNOSIS — I2510 Atherosclerotic heart disease of native coronary artery without angina pectoris: Secondary | ICD-10-CM

## 2021-05-15 DIAGNOSIS — R509 Fever, unspecified: Secondary | ICD-10-CM | POA: Diagnosis not present

## 2021-05-15 DIAGNOSIS — I5043 Acute on chronic combined systolic (congestive) and diastolic (congestive) heart failure: Secondary | ICD-10-CM | POA: Diagnosis not present

## 2021-05-15 DIAGNOSIS — N183 Chronic kidney disease, stage 3 unspecified: Secondary | ICD-10-CM

## 2021-05-15 DIAGNOSIS — Z79899 Other long term (current) drug therapy: Secondary | ICD-10-CM

## 2021-05-15 MED ORDER — ISOSORBIDE MONONITRATE ER 60 MG PO TB24: 60.0000 mg | ORAL_TABLET | Freq: Every day | ORAL | 3 refills | Status: DC

## 2021-05-15 NOTE — Patient Instructions (Addendum)
Medication Instructions:  Stay on the lasix 80 mg through Sunday and Valetta Fuller will call you Monday for an update   Increase your Imdur to 60 mg daily  *If you need a refill on your cardiac medications before your next appointment, please call your pharmacy*   Lab Work: BMET  If you have labs (blood work) drawn today and your tests are completely normal, you will receive your results only by: [place] (if you have MyChart) OR A paper copy in the mail If you have any lab test that is abnormal or we need to change your treatment, we will call you to review the results.   Testing/Procedures: none   Follow-Up:  With [facility] Wednesday May 20, 2021 at 1 pm  At [facility], you and your health needs are our priority.  As part of our continuing mission to provide you with exceptional heart care, we have created designated Provider Care Teams.  These Care Teams include your primary Cardiologist (physician) and Advanced Practice Providers (APPs -  Physician Assistants and Nurse Practitioners) who all work together to provide you with the care you need, when you need it.  We recommend signing up for the patient portal called "MyChart".  Sign up information is provided on this After Visit Summary.  MyChart is used to connect with patients for Virtual Visits (Telemedicine).  Patients are able to view lab/test results, encounter notes, upcoming appointments, etc.  Non-urgent messages can be sent to your provider as well.   To learn more about what you can do with MyChart, go to [email or web address].    Your next appointment:

## 2021-05-16 LAB — BASIC METABOLIC PANEL
BUN/Creatinine Ratio: 12 (ref 10–24)
BUN: 24 mg/dL (ref 8–27)
CO2: 25 mmol/L (ref 20–29)
Calcium: 9.4 mg/dL (ref 8.6–10.2)
Chloride: 96 mmol/L (ref 96–106)
Creatinine, Ser: 2.05 mg/dL — ABNORMAL HIGH (ref 0.76–1.27)
Glucose: 249 mg/dL — ABNORMAL HIGH (ref 65–99)
Potassium: 4.6 mmol/L (ref 3.5–5.2)
Sodium: 137 mmol/L (ref 134–144)
eGFR: 31 mL/min/{1.73_m2} — ABNORMAL LOW (ref >59)

## 2021-05-18 ENCOUNTER — Ambulatory Visit
Admission: RE | Admit: 2021-05-18 | Discharge: 2021-05-18 | Disposition: A | Discharge status: Home / Self Care | Payer: Medicare PPO | Source: Ambulatory Visit | Attending: Physician Assistant | Admitting: Physician Assistant

## 2021-05-18 ENCOUNTER — Other Ambulatory Visit: Payer: Self-pay | Admitting: Physician Assistant

## 2021-05-18 ENCOUNTER — Other Ambulatory Visit: Payer: Self-pay

## 2021-05-18 ENCOUNTER — Telehealth: Payer: Self-pay | Admitting: Physician Assistant

## 2021-05-18 DIAGNOSIS — R509 Fever, unspecified: Secondary | ICD-10-CM

## 2021-05-18 DIAGNOSIS — Z952 Presence of prosthetic heart valve: Secondary | ICD-10-CM

## 2021-05-18 DIAGNOSIS — R059 Cough, unspecified: Secondary | ICD-10-CM

## 2021-05-18 LAB — CBC WITH DIFFERENTIAL/PLATELET
Basophils Absolute: 0.1 10*3/uL (ref 0.0–0.2)
Basos: 1 %
EOS (ABSOLUTE): 0.7 10*3/uL — ABNORMAL HIGH (ref 0.0–0.4)
Eos: 8 %
Hematocrit: 39.4 % (ref 37.5–51.0)
Hemoglobin: 13 g/dL (ref 13.0–17.7)
Immature Grans (Abs): 0.1 10*3/uL (ref 0.0–0.1)
Immature Granulocytes: 1 %
Lymphocytes Absolute: 1 10*3/uL (ref 0.7–3.1)
Lymphs: 11 %
MCH: 27.5 pg (ref 26.6–33.0)
MCHC: 33 g/dL (ref 31.5–35.7)
MCV: 83 fL (ref 79–97)
Monocytes Absolute: 0.9 10*3/uL (ref 0.1–0.9)
Monocytes: 10 %
Neutrophils Absolute: 6.6 10*3/uL (ref 1.4–7.0)
Neutrophils: 69 %
Platelets: 225 10*3/uL (ref 150–450)
RBC: 4.73 x10E6/uL (ref 4.14–5.80)
RDW: 14.9 % (ref 11.6–15.4)
WBC: 9.4 10*3/uL (ref 3.4–10.8)

## 2021-05-18 NOTE — Telephone Encounter (Signed)
  [facility]   MULTIDISCIPLINARY HEART VALVE TEAM   Called to check in on patient this morning. He is doing better in terms of breathing but still not back to baseline. His weight is down 178--> 169 lbs. I have ask him to hold his lasix for now given increase in creat on last labs. Will get repeat BMET today. In addition I will get another CBC with diff, CXR and blood cultures as he has had ongoing low grade fevers going on 10 days. He still feels crummy, especially when he gets fevers at night. Reviewed vitals from this AM which are all WNL. Also afebrile at this time. He will see me back in the office on Wednesday.   Angelena Form PA-C  [facility]

## 2021-05-18 NOTE — Addendum Note (Signed)
Addended by: Eileen Stanford on: 05/18/2021 09:28 AM   Modules accepted: Orders

## 2021-05-19 LAB — BASIC METABOLIC PANEL
BUN/Creatinine Ratio: 13 (ref 10–24)
BUN: 26 mg/dL (ref 8–27)
CO2: 25 mmol/L (ref 20–29)
Calcium: 10 mg/dL (ref 8.6–10.2)
Chloride: 93 mmol/L — ABNORMAL LOW (ref 96–106)
Creatinine, Ser: 1.98 mg/dL — ABNORMAL HIGH (ref 0.76–1.27)
Glucose: 180 mg/dL — ABNORMAL HIGH (ref 65–99)
Potassium: 4.8 mmol/L (ref 3.5–5.2)
Sodium: 134 mmol/L (ref 134–144)
eGFR: 32 mL/min/{1.73_m2} — ABNORMAL LOW (ref >59)

## 2021-05-20 ENCOUNTER — Encounter: Payer: Self-pay | Admitting: Physician Assistant

## 2021-05-20 ENCOUNTER — Ambulatory Visit: Payer: Medicare PPO | Admitting: Physician Assistant

## 2021-05-20 ENCOUNTER — Other Ambulatory Visit: Payer: Self-pay

## 2021-05-20 VITALS — BP 114/62 | HR 75 | Ht 69.0 in | Wt 169.0 lb

## 2021-05-20 DIAGNOSIS — I5032 Chronic diastolic (congestive) heart failure: Secondary | ICD-10-CM | POA: Diagnosis not present

## 2021-05-20 DIAGNOSIS — N183 Chronic kidney disease, stage 3 unspecified: Secondary | ICD-10-CM | POA: Diagnosis not present

## 2021-05-20 DIAGNOSIS — Z952 Presence of prosthetic heart valve: Secondary | ICD-10-CM | POA: Diagnosis not present

## 2021-05-20 DIAGNOSIS — I25118 Atherosclerotic heart disease of native coronary artery with other forms of angina pectoris: Secondary | ICD-10-CM

## 2021-05-20 DIAGNOSIS — R509 Fever, unspecified: Secondary | ICD-10-CM

## 2021-05-20 NOTE — Progress Notes (Signed)
[facility] [place]                                     Cardiology Office Note:    Date:  05/20/2021   ID:  [name], DOB [birth year], MRN [id number]  PCP:  [name], MD  [facility] Cardiologist:  [name], MD / Dr. Angelena Form & Dr. [name]  (TAVR) [place] Electrophysiologist:  None   Referring MD: [name], MD   Close follow up of CHF and chest pain.   History of Present Illness:    [name] is a 87 y.o. male with a hx of arthritis, COPD, LBBB, CAD s/p 4V CABG in 2009 by PVT and multiple subsequent PCIs, CKD stage 3-4, diabetes mellitus, GERD, HTN, HLD, carotid artery disease, chronic diastolic CHF with most recent admission in 09/2020, persistent atrial fibrillation on Eliquis, prostate cancer and severe LFLG aortic stenosis s/p TAVR (04/28/21) who presents to clinic for follow up.   Patient's cardiac history dates back to 2008 when he underwent multivessel coronary artery bypass grafting by Dr. Prescott Gum.  He has been followed intermittently ever since by Dr. Percival Spanish.  He has undergone multiple previous stent procedures for vein graft disease most recently in December 2021 when he underwent PCI of saphenous vein graft to the obtuse marginal branch of the left circumflex coronary artery.  Catheterization at that time revealed that all of the other bypass grafts remained widely patent and free of significant disease.  Patient has had aortic stenosis that has gradually progressed in severity. Recent follow-up echocardiogram performed Mar 12, 2021 revealed further progression of disease.  Left ventricular ejection fraction was estimated 45 to 50%.  The aortic valve is calcified with severe calcification, thickening, and restricted leaflet mobility involving all 3 leaflets.  Peak velocity across the aortic valve measured 2.7 m/s corresponding to mean transvalvular gradient estimated 21 mmHg but aortic valve area  calculated only 0.63 cm with DVI notably quite low at 0.20 and stroke-volume index only 21.   He was evaluated by the multidisciplinary valve team and underwent successful TAVR with a 29 mm Medtronic Evolut Pro + THV via the TF approach on 04/28/21. Post operative echo showed EF 40%, normally functioning TAVR with a mean gradient of 4 mmHg and trivial PVL. Plavix was discontinued and he was started on aspirin 81 mg daily and resumed on Eliquis. Of note, he was admitted the day before his case due to chest pain while being evaluated at preadmission testing.   I saw him for post hospital follow up on 7/27 and he complained of worsening chest pain as well as fevers, weight gain, LE edema, orthopnea, cough and PND. He was noted to be volume overloaded on exam and up 8 lbs. Follow up labs showed creat 1.7, nt BNP ~11K, WBC 11.2, Hg 12.1. I increased his lasix from $RemoveBe'40mg'ZSzyrzPTT$  BID to 80 mg BID and started him on Imdur 30 mg daily. He was afebrile in the office. 02 sats were 98%.  I saw him for close follow-up on 7/29.  He was still quite short of breath with chest pain but weight was down 4 pounds. We continued him on increased Lasix and repeated labs.  I also increased Imdur to 60 mg daily.  Follow-up labs showed creatinine had increased to 2.05 but then decreased to 1.98.  Per  discussion on phone yesterday (8/2) his weight was down to 162 lbs. I had him hold his Lasix on 8/2 and resume on 8/3 at 40 mg daily.  Of note, given ongoing fevers CXR and blood cultures were obtained. CXR was unremarkable and blood cultures have shown NGTD.  Today he presents to clinic for follow-up. Here with wife. Doing much better. Still having shortness of breath but improved. Chest pain resolved. LE edema and orthopnea resolved. Wants to go to the beach. Did have another mild temp last night. No dizziness or syncope. No blood in stool or urine.    Past Medical History:  Diagnosis Date   Aortic stenosis, moderate 09/20/2020   Arthritis     CAD (coronary artery disease)    a. Cath September 2015 LIMA to the LAD patent, SVG to PDA patent, SVG to posterior lateral patent, SVG to OM with a 90% in-stent restenosis at an anastomotic lesion. This was treated with angioplasty. b. cath 04/01/2015 95% ISR in SVG to OM treated with 2.75x24 Synergy DES postdilated to 3.78mm, all other grafts patent   Cataract    surgery,B/L   CHF (congestive heart failure) (HCC)    Chronic kidney disease    nephrolithiasis   Diabetes mellitus    TYPE 2   GERD (gastroesophageal reflux disease)    Hernia    Hypercholesterolemia    Hypertension    Incontinence    hx  over 1 year,leaks without awareness   Other and unspecified diseases of appendix    PAF (paroxysmal atrial fibrillation) ([place])    in the setting of ischemia on 03/31/2015   Pancreatitis    Prostate CA ([place]) 03/01/04   prostate bx=Adenocarcinoam,gleason 3+4=7,PSA=6.75   Shortness of breath dyspnea    Ulcer    hx gastric    Past Surgical History:  Procedure Laterality Date   APPENDECTOMY     BACK SURGERY     CARDIAC CATHETERIZATION N/A 04/01/2015   Procedure: Left Heart Cath and Cors/Grafts Angiography;  Surgeon: Jettie Booze, MD;  Location: [place] [facility];  Service: Cardiovascular;  Laterality: N/A;   CARDIAC CATHETERIZATION N/A 04/01/2015   Procedure: Coronary Stent Intervention;  Surgeon: Jettie Booze, MD;  Location: [place] [facility];  Service: Cardiovascular;  Laterality: N/A;   CARDIOVERSION N/A 11/01/2019   Procedure: CARDIOVERSION;  Surgeon: Geralynn Rile, MD;  Location: [facility] ENDOSCOPY;  Service: Cardiovascular;  Laterality: N/A;   CARPAL TUNNEL RELEASE  2004   both hands   CORONARY ARTERY BYPASS GRAFT     CORONARY STENT INTERVENTION N/A 09/23/2020   Procedure: CORONARY STENT INTERVENTION;  Surgeon: Sherren Mocha, MD;  Location: [place] [facility];  Service: Cardiovascular;  Laterality: N/A;   CYSTOSCOPY  08/23/12   incomplete emoptying bladder    HERNIA REPAIR     incision and drainage of right chest abscess     INTRAOPERATIVE TRANSTHORACIC ECHOCARDIOGRAM Left 04/28/2021   Procedure: INTRAOPERATIVE TRANSTHORACIC ECHOCARDIOGRAM;  Surgeon: Burnell Blanks, MD;  Location: [place];  Service: Open Heart Surgery;  Laterality: Left;   LAPAROSCOPIC CHOLECYSTECTOMY  09/2009   LEFT HEART CATHETERIZATION WITH CORONARY ANGIOGRAM N/A 07/19/2014   Procedure: LEFT HEART CATHETERIZATION WITH CORONARY ANGIOGRAM;  Surgeon: Leonie Man, MD;  Location[facility] CATH LAB;  Service: Cardiovascular;  Laterality: N/A;   RECTAL SURGERY     RIGHT/LEFT HEART CATH AND CORONARY/GRAFT ANGIOGRAPHY N/A 09/23/2020   Procedure: RIGHT/LEFT HEART CATH AND CORONARY/GRAFT ANGIOGRAPHY;  Surgeon: Sherren Mocha, MD;  Location: [place]  CV LAB;  Service: Cardiovascular;  Laterality: N/A;   ROBOT ASSISTED LAPAROSCOPIC RADICAL PROSTATECTOMY  2005   TRANSCATHETER AORTIC VALVE REPLACEMENT, TRANSFEMORAL Bilateral 04/28/2021   Procedure: TRANSCATHETER AORTIC VALVE REPLACEMENT, TRANSFEMORAL;  Surgeon: Kathleene Hazel, MD;  Location: [facility] OR;  Service: Open Heart Surgery;  Laterality: Bilateral;   ULTRASOUND GUIDANCE FOR VASCULAR ACCESS Bilateral 04/28/2021   Procedure: ULTRASOUND GUIDANCE FOR VASCULAR ACCESS;  Surgeon: Kathleene Hazel, MD;  [facility];  Service: Open Heart Surgery;  Laterality: Bilateral;    Current Medications: Current Meds  Medication Sig   albuterol (VENTOLIN HFA) 108 (90 Base) MCG/ACT inhaler Inhale 2 puffs into the lungs every 6 (six) hours as needed.   amLODipine (NORVASC) 5 MG tablet Take 1 tablet (5 mg total) by mouth daily.   apixaban (ELIQUIS) 2.5 MG TABS tablet Take 1 tablet (2.5 mg total) by mouth 2 (two) times daily.   aspirin 81 MG chewable tablet Chew 1 tablet (81 mg total) by mouth daily.   blood glucose meter kit and supplies KIT Dispense based on patient and insurance preference. Use up to four times daily as directed. (FOR  ICD-9 250.00, 250.01).   budesonide-formoterol (SYMBICORT) 160-4.5 MCG/ACT inhaler Inhale 2 puffs into the lungs in the morning and at bedtime.   Carboxymethylcellul-Glycerin (LUBRICATING EYE DROPS OP) Place 1 drop into both eyes daily as needed (dry eyes).   carvedilol (COREG) 6.25 MG tablet TAKE 1 TABLET (6.25 MG TOTAL) BY MOUTH 2 (TWO) TIMES DAILY WITH A MEAL.   docusate sodium (STOOL SOFTENER) 100 MG capsule Take 1 capsule (100 mg total) by mouth 2 (two) times daily as needed for mild constipation.   FLUoxetine (PROZAC) 10 MG capsule TAKE 1 CAPSULE BY MOUTH EVERY DAY   furosemide (LASIX) 40 MG tablet Take 1 tablet (40 mg total) by mouth in the morning and at bedtime.   Homeopathic Products [facility] RELIEF EX) Apply 1 application topically daily as needed (knee pain).   hydrocortisone 2.5 % cream Apply 1 application topically daily as needed (facial breakouts).   isosorbide mononitrate (IMDUR) 60 MG 24 hr tablet Take 1 tablet (60 mg total) by mouth daily.   JANUVIA 50 MG tablet TAKE 1 TABLET BY MOUTH EVERY DAY   Multiple Vitamins-Minerals (ICAPS AREDS 2 PO) Take 1 capsule by mouth daily.   nitroGLYCERIN (NITROSTAT) 0.4 MG SL tablet Place 1 tablet (0.4 mg total) under the tongue every 5 (five) minutes x 3 doses as needed for chest pain (if no relief after 3rd dose, proceed to the ED for an evaluation).   pantoprazole (PROTONIX) 40 MG tablet TAKE 1 TABLET BY MOUTH EVERY DAY   potassium chloride SA (KLOR-CON) 20 MEQ tablet Take as directed. Take one tablet daily for 4 days.   rosuvastatin (CRESTOR) 20 MG tablet TAKE 1 TABLET BY MOUTH EVERY DAY   spironolactone (ALDACTONE) 25 MG tablet Take 1 tablet (25 mg total) by mouth daily.     Allergies:   Codeine and Morphine   Social History   Socioeconomic History   Marital status: Married    Spouse name: Not on file   Number of children: 2   Years of education: Not on file   Highest education level: Not on file  Occupational History     Employer: RETIRED    Comment: Retired  Tobacco Use   Smoking status: Former    Packs/day: 0.50    Years: 5.00    Pack years: 2.50    Types: Cigarettes  Quit date: 07/20/1972    Years since quitting: 48.8   Smokeless tobacco: Never   Tobacco comments:    quit s  Vaping Use   Vaping Use: Never used  Substance and Sexual Activity   Alcohol use: No   Drug use: No    Comment: quit smoking 40 years ago   Sexual activity: Never  Other Topics Concern   Not on file  Social History Narrative   Retired   Married      Social Determinants of Radio broadcast assistant Strain: Low Risk    Difficulty of Paying Living Expenses: Not hard at all  Food Insecurity: No Food Insecurity   Worried About Charity fundraiser in the Last Year: Never true   Arboriculturist in the Last Year: Never true  Transportation Needs: No Transportation Needs   Lack of Transportation (Medical): No   Lack of Transportation (Non-Medical): No  Physical Activity: Insufficiently Active   Days of Exercise per Week: 3 days   Minutes of Exercise per Session: 30 min  Stress: No Stress Concern Present   Feeling of Stress : Not at all  Social Connections: Socially Integrated   Frequency of Communication with Friends and Family: More than three times a week   Frequency of Social Gatherings with Friends and Family: More than three times a week   Attends Religious Services: More than 4 times per year   Active Member of [employer] or Organizations: Yes   Attends Archivist Meetings: 1 to 4 times per year   Marital Status: Married     Family History: The patient's family history includes Cancer in his mother; Heart attack in his father.  ROS:   Please see the history of present illness.    All other systems reviewed and are negative.  EKGs/Labs/Other Studies Reviewed:    The following studies were reviewed today:    TAVR OPERATIVE NOTE     Date of Procedure:                04/28/2021   Preoperative  Diagnosis:      Severe Aortic Stenosis   Postoperative Diagnosis:    Same   Procedure:        Transcatheter Aortic Valve Replacement - Percutaneous Right Transfemoral Approach             Medtronic CoreValve Evolut Pro Plus (size 29 mm, serial # [id number])              Co-Surgeons:                        Valentina Gu. [name], MD and Lauree Chandler, MD   Anesthesiologist:                  Myrtie Soman, MD   Echocardiographer:              Jenkins Rouge, MD   Pre-operative Echo Findings: Severe aortic stenosis Low normal left ventricular systolic function   Post-operative Echo Findings: Trivial paravalvular leak Unchanged left ventricular systolic function   _______________   Echo 05/12/21 IMPRESSIONS   1. Septal , apical and inferior wall hypokinesis . Left ventricular  ejection fraction, by estimation, is 40 to 45%. The left ventricle has  mildly decreased function. The left ventricle demonstrates regional wall  motion abnormalities (see scoring  diagram/findings for description). The left ventricular internal cavity  size was mildly dilated. There is moderate left  ventricular hypertrophy.  Left ventricular diastolic parameters are indeterminate.   2. Right ventricular systolic function is normal. The right ventricular  size is normal. There is normal pulmonary artery systolic pressure.   3. Left atrial size was moderately dilated.   4. Right atrial size was mildly dilated.   5. The mitral valve is abnormal. Mild mitral valve regurgitation. No  evidence of mitral stenosis. Moderate mitral annular calcification.   6. Post TAVR 04/28/21 with 29 mm Medtronic Evolut Pro valve Trivial PVL  seen best on PSSA images 6:00 mean gradient 4 peak 6 mmHg AVR 2.8 cm2. The  aortic valve has been repaired/replaced. Aortic valve regurgitation is not  visualized. No aortic stenosis  is present. Procedure Date: 0712/2022.   7. The inferior vena cava is normal in size with greater than 50%   respiratory variability, suggesting right atrial pressure of 3 mmHg.   EKG:  EKG is NOT ordered today.  Recent Labs: 09/21/2020: TSH 2.809 04/27/2021: ALT 19; B Natriuretic Peptide 695.0 04/29/2021: Magnesium 1.8 05/13/2021: NT-Pro BNP 10,919 05/18/2021: BUN 26; Creatinine, Ser 1.98; Hemoglobin 13.0; Platelets 225; Potassium 4.8; Sodium 134  Recent Lipid Panel    Component Value Date/Time   CHOL 97 (L) 01/01/2021 0848   TRIG 92 01/01/2021 0848   TRIG 306 (HH) 09/27/2006 1154   HDL 33 (L) 01/01/2021 0848   CHOLHDL 2.9 01/01/2021 0848   CHOLHDL 4 01/14/2020 0854   VLDL 26.4 01/14/2020 0854   LDLCALC 46 01/01/2021 0848   LDLDIRECT 143.3 11/23/2007 0000     Risk Assessment/Calculations:    CHA2DS2-VASc Score = 5  This indicates a 7.2% annual risk of stroke. The patient's score is based upon: CHF History: Yes HTN History: Yes Diabetes History: Yes Stroke History: No Vascular Disease History: No Age Score: 2 Gender Score: 0    Physical Exam:    VS:  BP 114/62   Pulse 75   Ht $R'5\' 9"'cR$  (1.753 m)   Wt 169 lb (76.7 kg)   SpO2 98%   BMI 24.96 kg/m     Wt Readings from Last 3 Encounters:  05/20/21 169 lb (76.7 kg)  05/15/21 174 lb (78.9 kg)  05/13/21 178 lb (80.7 kg)     GEN:  Well nourished, well developed in no acute distress HEENT: Normal NECK: mild JVD LYMPHATICS: No lymphadenopathy CARDIAC: irreg irreg, no murmurs, rubs, gallops RESPIRATORY:clear  ABDOMEN: Soft, non-tender, non-distended MUSCULOSKELETAL: no LE edema SKIN: Warm and dry.  NEUROLOGIC:  Alert and oriented x 3 PSYCHIATRIC:  Normal affect   ASSESSMENT:    1. S/P TAVR (transcatheter aortic valve replacement)   2. Coronary artery disease involving native heart with other form of angina pectoris, unspecified vessel or lesion type (HCC)   3. Stage 3 chronic kidney disease, unspecified whether stage 3a or 3b CKD ([place])   4. Chronic diastolic (congestive) heart failure ([place])   5. Fever, unspecified fever  cause      PLAN:    In order of problems listed above:  Severe AS s/p TAVR: plan for regular TAVR follow up echo 8/18  CAD with chest pain: chest pain much improved on imdur 60 and diruresis  CKD stage III: creat ~1.7--> 2.05--> 1.9. Will repeat BMET today given adjustments of dieretics   Chronic diastolic CHF: appears euvolemic. Weight 169. Will have him restart normal dose of lasix 40 mg BID starting tomorrow.   Fevers: CXR unremarkable and blood cultures NGTD   Medication Adjustments/Labs and Tests Ordered: Current medicines are  reviewed at length with the patient today.  Concerns regarding medicines are outlined above.  Orders Placed This Encounter  Procedures   Basic metabolic panel      No orders of the defined types were placed in this encounter.     Patient Instructions  Medication Instructions:  Your physician has recommended you make the following change in your medication:  1-Take Lasix (Furosemide) 40 mg by mouth twice daily.  *If you need a refill on your cardiac medications before your next appointment, please call your pharmacy*  Lab Work: Your physician recommends that you have lab work today- BMET  If you have labs (blood work) drawn today and your tests are completely normal, you will receive your results only by: MyChart Message (if you have MyChart) OR A paper copy in the mail If you have any lab test that is abnormal or we need to change your treatment, we will call you to review the results.  Follow-Up: At [facility], you and your health needs are our priority.  As part of our continuing mission to provide you with exceptional heart care, we have created designated Provider Care Teams.  These Care Teams include your primary Cardiologist (physician) and Advanced Practice Providers (APPs -  Physician Assistants and Nurse Practitioners) who all work together to provide you with the care you need, when you need it.  We recommend signing up for  the patient portal called "MyChart".  Sign up information is provided on this After Visit Summary.  MyChart is used to connect with patients for Virtual Visits (Telemedicine).  Patients are able to view lab/test results, encounter notes, upcoming appointments, etc.  Non-urgent messages can be sent to your provider as well.   To learn more about what you can do with MyChart, go to [email or web address].    Your next appointment:   Is already scheduled.   Signed, Angelena Form, PA-C  05/20/2021 3:18 PM    [facility]

## 2021-05-20 NOTE — Patient Instructions (Addendum)
Medication Instructions:  Your physician has recommended you make the following change in your medication:  1-Take Lasix (Furosemide) 40 mg by mouth twice daily.  *If you need a refill on your cardiac medications before your next appointment, please call your pharmacy*  Lab Work: Your physician recommends that you have lab work today- BMET  If you have labs (blood work) drawn today and your tests are completely normal, you will receive your results only by: MyChart Message (if you have MyChart) OR A paper copy in the mail If you have any lab test that is abnormal or we need to change your treatment, we will call you to review the results.  Follow-Up: At [facility], you and your health needs are our priority.  As part of our continuing mission to provide you with exceptional heart care, we have created designated Provider Care Teams.  These Care Teams include your primary Cardiologist (physician) and Advanced Practice Providers (APPs -  Physician Assistants and Nurse Practitioners) who all work together to provide you with the care you need, when you need it.  We recommend signing up for the patient portal called "MyChart".  Sign up information is provided on this After Visit Summary.  MyChart is used to connect with patients for Virtual Visits (Telemedicine).  Patients are able to view lab/test results, encounter notes, upcoming appointments, etc.  Non-urgent messages can be sent to your provider as well.   To learn more about what you can do with MyChart, go to [email or web address].    Your next appointment:   Is already scheduled.

## 2021-05-21 ENCOUNTER — Telehealth: Payer: Self-pay

## 2021-05-21 LAB — BASIC METABOLIC PANEL
BUN/Creatinine Ratio: 14 (ref 10–24)
BUN: 29 mg/dL — ABNORMAL HIGH (ref 8–27)
CO2: 22 mmol/L (ref 20–29)
Calcium: 9.5 mg/dL (ref 8.6–10.2)
Chloride: 94 mmol/L — ABNORMAL LOW (ref 96–106)
Creatinine, Ser: 2.07 mg/dL — ABNORMAL HIGH (ref 0.76–1.27)
Glucose: 216 mg/dL — ABNORMAL HIGH (ref 65–99)
Potassium: 4.3 mmol/L (ref 3.5–5.2)
Sodium: 133 mmol/L — ABNORMAL LOW (ref 134–144)
eGFR: 31 mL/min/{1.73_m2} — ABNORMAL LOW (ref >59)

## 2021-05-21 MED ORDER — FUROSEMIDE 40 MG PO TABS: ORAL_TABLET | ORAL | 3 refills | Status: DC

## 2021-05-21 NOTE — Telephone Encounter (Signed)
The patient's wife has been notified of the result and verbalized understanding.  All questions (if any) were answered. Antonieta Iba, RN 05/21/2021 8:55 AM

## 2021-05-21 NOTE — Telephone Encounter (Signed)
-----   Message from Eileen Stanford, PA-C sent at 05/21/2021  2:01 AM EDT ----- Creat a little more elevated but stable. Lets change his maintenance dose to lasix 40mg  in the AM and 20mg  in the PM. We will repeat labs at follow up apt.

## 2021-05-24 LAB — CULTURE, BLOOD (SINGLE)

## 2021-05-31 ENCOUNTER — Other Ambulatory Visit: Payer: Self-pay | Admitting: Cardiology

## 2021-06-04 ENCOUNTER — Ambulatory Visit (HOSPITAL_COMMUNITY): Payer: Medicare PPO | Attending: Cardiovascular Disease

## 2021-06-04 ENCOUNTER — Encounter (INDEPENDENT_AMBULATORY_CARE_PROVIDER_SITE_OTHER): Payer: Self-pay

## 2021-06-04 ENCOUNTER — Ambulatory Visit: Payer: Medicare PPO | Admitting: Physician Assistant

## 2021-06-04 ENCOUNTER — Encounter: Payer: Self-pay | Admitting: Physician Assistant

## 2021-06-04 ENCOUNTER — Other Ambulatory Visit: Payer: Self-pay

## 2021-06-04 ENCOUNTER — Other Ambulatory Visit: Payer: Self-pay | Admitting: Family Medicine

## 2021-06-04 ENCOUNTER — Other Ambulatory Visit: Payer: Self-pay | Admitting: Cardiology

## 2021-06-04 VITALS — BP 140/60 | HR 78 | Ht 69.0 in | Wt 173.0 lb

## 2021-06-04 DIAGNOSIS — I350 Nonrheumatic aortic (valve) stenosis: Secondary | ICD-10-CM | POA: Diagnosis not present

## 2021-06-04 DIAGNOSIS — N183 Chronic kidney disease, stage 3 unspecified: Secondary | ICD-10-CM | POA: Diagnosis not present

## 2021-06-04 DIAGNOSIS — I25118 Atherosclerotic heart disease of native coronary artery with other forms of angina pectoris: Secondary | ICD-10-CM | POA: Diagnosis not present

## 2021-06-04 DIAGNOSIS — Z952 Presence of prosthetic heart valve: Secondary | ICD-10-CM | POA: Insufficient documentation

## 2021-06-04 DIAGNOSIS — R509 Fever, unspecified: Secondary | ICD-10-CM

## 2021-06-04 DIAGNOSIS — I6523 Occlusion and stenosis of bilateral carotid arteries: Secondary | ICD-10-CM

## 2021-06-04 DIAGNOSIS — I5032 Chronic diastolic (congestive) heart failure: Secondary | ICD-10-CM | POA: Diagnosis not present

## 2021-06-04 LAB — ECHOCARDIOGRAM COMPLETE
AR max vel: 2.92 cm2
AV Area VTI: 3.33 cm2
AV Area mean vel: 3.05 cm2
AV Mean grad: 3 mmHg
AV Peak grad: 5.7 mmHg
Ao pk vel: 1.19 m/s
Area-P 1/2: 3.38 cm2
Height: 69 in
P 1/2 time: 638 msec
S' Lateral: 3.5 cm

## 2021-06-04 MED ORDER — FUROSEMIDE 40 MG PO TABS
60.0000 mg | ORAL_TABLET | Freq: Two times a day (BID) | ORAL | 6 refills | Status: DC
Start: 2021-06-04 — End: 2021-07-12

## 2021-06-04 NOTE — Patient Instructions (Addendum)
Medication Instructions:  Your physician has recommended you make the following change in your medication:  1.) stop IMDUR  2.) increase lasix (furosemide) to 60 mg (ONE AND HALF TABLETS) twice a day   *If you need a refill on your cardiac medications before your next appointment, please call your pharmacy*   Lab Work: BMET today   Testing/Procedures: none   Follow-Up: As planned   Other Instructions

## 2021-06-04 NOTE — Progress Notes (Addendum)
[place]                                     Cardiology Office Note:    Date:  06/04/2021   ID:  [name], DOB September 07, 1933, MRN [id number]  PCP:  [name], MD  [facility] Cardiologist:  [name], MD / Dr. Angelena Form & Dr. [name]  (TAVR) [facility] Electrophysiologist:  None   Referring MD: [name], MD   1 month s/p TAVR  History of Present Illness:    [name] is a 87 y.o. male with a hx of arthritis, COPD, LBBB, CAD s/p 4V CABG in 2009 by PVT and multiple subsequent PCIs, CKD stage 3-4, diabetes mellitus, GERD, HTN, HLD, carotid artery disease, chronic diastolic CHF with most recent admission in 09/2020, persistent atrial fibrillation on Eliquis, prostate cancer and severe LFLG aortic stenosis s/p TAVR (04/28/21) who presents to clinic for follow up.   Patient's cardiac history dates back to 2008 when he underwent multivessel coronary artery bypass grafting by Dr. Prescott Gum.  He has been followed intermittently ever since by Dr. Percival Spanish.  He has undergone multiple previous stent procedures for vein graft disease most recently in December 2021 when he underwent PCI of saphenous vein graft to the obtuse marginal branch of the left circumflex coronary artery.  Catheterization at that time revealed that all of the other bypass grafts remained widely patent and free of significant disease.  Patient has had aortic stenosis that has gradually progressed in severity. Recent follow-up echocardiogram performed Mar 12, 2021 revealed further progression of disease.  Left ventricular ejection fraction was estimated 45 to 50%.  The aortic valve is calcified with severe calcification, thickening, and restricted leaflet mobility involving all 3 leaflets.  Peak velocity across the aortic valve measured 2.7 m/s corresponding to mean transvalvular gradient estimated 21 mmHg but aortic valve area calculated only 0.63 cm  with DVI notably quite low at 0.20 and stroke-volume index only 21.   He was evaluated by the multidisciplinary valve team and underwent successful TAVR with a 29 mm Medtronic Evolut Pro + THV via the TF approach on 04/28/21. Post operative echo showed EF 40%, normally functioning TAVR with a mean gradient of 4 mmHg and trivial PVL. Plavix was discontinued and he was started on aspirin 81 mg daily and resumed on Eliquis. Of note, he was admitted the day before his case due to chest pain while being evaluated at preadmission testing.   I saw him for post hospital follow up on 7/27 and he complained of worsening chest pain as well as fevers, weight gain, LE edema, orthopnea, cough and PND. He was noted to be volume overloaded on exam and up 8 lbs. Follow up labs showed creat 1.7, nt BNP ~11K, WBC 11.2, Hg 12.1. I increased his lasix from 49m BID to 80 mg BID and started him on Imdur 30 mg daily. He was afebrile in the office. 02 sats were 98%.  I saw him for close follow-up on 7/29.  He was still quite short of breath with chest pain but weight was down 4 pounds. We continued him on increased Lasix and repeated labs.  I also increased Imdur to 60 mg daily.  Follow-up labs showed creatinine had increased to 2.05 but then decreased to 1.98.  Per discussion on phone (05/19/21) his  weight was down to 162 lbs. I had him hold his Lasix on 8/2 and resume on 8/3 at 40 mg BID.  Of note, given ongoing fevers CXR and blood cultures were obtained. CXR was unremarkable and blood cultures have shown NGTD. Follow up creat was ~2 and Lasix changed to 11m in AM and 227min PM.  Today he presents to clinic for follow-up. Here with wife. He felt good when his weight was down to 162 but kidney function bumped and lasix was cut back down. He also feels dizzy and thinks its related to the imdur. He feels extremely short of breath and just "can't live this way anymore." Occasionally gets chest pain. Has orthopnea and PND.    Past  Medical History:  Diagnosis Date   Aortic stenosis, moderate 09/20/2020   Arthritis    CAD (coronary artery disease)    a. Cath September 2015 LIMA to the LAD patent, SVG to PDA patent, SVG to posterior lateral patent, SVG to OM with a 90% in-stent restenosis at an anastomotic lesion. This was treated with angioplasty. b. cath 04/01/2015 95% ISR in SVG to OM treated with 2.75x24 Synergy DES postdilated to 3.71m45mall other grafts patent   Cataract    surgery,B/L   CHF (congestive heart failure) (HCC)    Chronic kidney disease    nephrolithiasis   Diabetes mellitus    TYPE 2   GERD (gastroesophageal reflux disease)    Hernia    Hypercholesterolemia    Hypertension    Incontinence    hx  over 1 year,leaks without awareness   Other and unspecified diseases of appendix    [place]mal [place]) [place]  in the setting of ischemia on 03/31/2015   Pancreatitis    [place]15[place]   prostate bx=Adenocarcinoam,gleason 3+4=7,PSA=6.75   Shortness of breath dyspnea    Ulcer    hx gastric    Past Surgical History:  Procedure Laterality Date   APPENDECTOMY     BACK SURGERY     CARDIAC CATHETERIZATION N/A 04/01/2015   Procedure: Left Heart Cath and Cors/Grafts Angiography;  Surgeon: JayJettie BoozeD;  [facility];  Service: Cardiovascular;  Laterality: N/A;   CARDIAC CATHETERIZATION N/A 04/01/2015   Procedure: Coronary Stent Intervention;  Surgeon: JayJettie BoozeD;  Location: [facility] [place] [facility];  Service: Cardiovascular;  Laterality: N/A;   CARDIOVERSION N/A 11/01/2019   Procedure: CARDIOVERSION;  Surgeon: O'NGeralynn RileD;  [facility];  Service: Cardiovascular;  Laterality: N/A;   CARPAL TUNNEL RELEASE  2004   both hands   CORONARY ARTERY BYPASS GRAFT     CORONARY STENT INTERVENTION N/A 09/23/2020   Procedure: CORONARY STENT INTERVENTION;  Surgeon: CooSherren MochaD;  Location: [facility] [place] [facility];  Service:  Cardiovascular;  Laterality: N/A;   CYSTOSCOPY  08/23/12   incomplete emoptying bladder   HERNIA REPAIR     incision and drainage of right chest abscess     INTRAOPERATIVE TRANSTHORACIC ECHOCARDIOGRAM Left 04/28/2021   Procedure: INTRAOPERATIVE TRANSTHORACIC ECHOCARDIOGRAM;  Surgeon: McABurnell BlanksD;  [facility];  Laterality: Left;   LAPAROSCOPIC CHOLECYSTECTOMY  09/2009   LEFT HEART CATHETERIZATION WITH CORONARY ANGIOGRAM N/A 07/19/2014   Procedure: LEFT HEART CATHETERIZATION WITH CORONARY ANGIOGRAM;  Surgeon: DavLeonie ManD;  [facility];  Service: Cardiovascular;  Laterality: N/A;   RECTAL SURGERY     RIGHT/LEFT HEART CATH AND CORONARY/GRAFT  ANGIOGRAPHY N/A 09/23/2020   Procedure: RIGHT/LEFT HEART CATH AND CORONARY/GRAFT ANGIOGRAPHY;  Surgeon: Sherren Mocha, MD;  Location: [place] [facility];  Service: Cardiovascular;  Laterality: N/A;   ROBOT ASSISTED LAPAROSCOPIC RADICAL PROSTATECTOMY  2005   TRANSCATHETER AORTIC VALVE REPLACEMENT, TRANSFEMORAL Bilateral 04/28/2021   Procedure: TRANSCATHETER AORTIC VALVE REPLACEMENT, TRANSFEMORAL;  Surgeon: Burnell Blanks, MD;  Location: [place];  Service: Open Heart Surgery;  Laterality: Bilateral;   ULTRASOUND GUIDANCE FOR VASCULAR ACCESS Bilateral 04/28/2021   Procedure: ULTRASOUND GUIDANCE FOR VASCULAR ACCESS;  Surgeon: Burnell Blanks, MD;  Location: [place];  Service: Open Heart Surgery;  Laterality: Bilateral;    Current Medications: Current Meds  Medication Sig   albuterol (VENTOLIN HFA) 108 (90 Base) MCG/ACT inhaler Inhale 2 puffs into the lungs every 6 (six) hours as needed.   amLODipine (NORVASC) 5 MG tablet Take 1 tablet (5 mg total) by mouth daily.   apixaban (ELIQUIS) 2.5 MG TABS tablet Take 1 tablet (2.5 mg total) by mouth 2 (two) times daily.   aspirin 81 MG chewable tablet Chew 1 tablet (81 mg total) by mouth daily.   blood glucose meter kit and supplies KIT  Dispense based on patient and insurance preference. Use up to four times daily as directed. (FOR ICD-9 250.00, 250.01).   budesonide-formoterol (SYMBICORT) 160-4.5 MCG/ACT inhaler Inhale 2 puffs into the lungs in the morning and at bedtime.   Carboxymethylcellul-Glycerin (LUBRICATING EYE DROPS OP) Place 1 drop into both eyes daily as needed (dry eyes).   carvedilol (COREG) 6.25 MG tablet TAKE 1 TABLET (6.25 MG TOTAL) BY MOUTH 2 (TWO) TIMES DAILY WITH A MEAL.   docusate sodium (STOOL SOFTENER) 100 MG capsule Take 1 capsule (100 mg total) by mouth 2 (two) times daily as needed for mild constipation.   FLUoxetine (PROZAC) 10 MG capsule TAKE 1 CAPSULE BY MOUTH EVERY DAY   furosemide (LASIX) 40 MG tablet Take 1.5 tablets (60 mg total) by mouth 2 (two) times daily.   Homeopathic Products [facility] RELIEF EX) Apply 1 application topically daily as needed (knee pain).   hydrocortisone 2.5 % cream Apply 1 application topically daily as needed (facial breakouts).   JANUVIA 50 MG tablet TAKE 1 TABLET BY MOUTH EVERY DAY   Multiple Vitamins-Minerals (ICAPS AREDS 2 PO) Take 1 capsule by mouth daily.   nitroGLYCERIN (NITROSTAT) 0.4 MG SL tablet PLACE 1 TABLET UNDER THE TONGUE EVERY 5 MINUTES X 3 DOSES AS NEEDED FOR CHEST PAIN   pantoprazole (PROTONIX) 40 MG tablet TAKE 1 TABLET BY MOUTH EVERY DAY   potassium chloride SA (KLOR-CON) 20 MEQ tablet Take as directed. Take one tablet daily for 4 days.   rosuvastatin (CRESTOR) 20 MG tablet TAKE 1 TABLET BY MOUTH EVERY DAY   spironolactone (ALDACTONE) 25 MG tablet Take 1 tablet (25 mg total) by mouth daily.   [DISCONTINUED] furosemide (LASIX) 40 MG tablet Take 1 tablet (40 mg total) every morning and 1/2 tablet (20 mg total) every evening.   [DISCONTINUED] isosorbide mononitrate (IMDUR) 60 MG 24 hr tablet Take 1 tablet (60 mg total) by mouth daily.     Allergies:   Codeine and Morphine   Social History   Socioeconomic History   Marital status: Married    Spouse  name: Not on file   Number of children: 2   Years of education: Not on file   Highest education level: Not on file  Occupational History    Employer: RETIRED    Comment: Retired  Tobacco Use  Smoking status: Former    Packs/day: 0.50    Years: 5.00    Pack years: 2.50    Types: Cigarettes    Quit date: 07/20/1972    Years since quitting: 48.9   Smokeless tobacco: Never   Tobacco comments:    quit s  Vaping Use   Vaping Use: Never used  Substance and Sexual Activity   Alcohol use: No   Drug use: No    Comment: quit smoking 40 years ago   Sexual activity: Never  Other Topics Concern   Not on file  Social History Narrative   Retired   Married      Social Determinants of Radio broadcast assistant Strain: Low Risk    Difficulty of Paying Living Expenses: Not hard at all  Food Insecurity: No Food Insecurity   Worried About Charity fundraiser in the Last Year: Never true   Arboriculturist in the Last Year: Never true  Transportation Needs: No Transportation Needs   Lack of Transportation (Medical): No   Lack of Transportation (Non-Medical): No  Physical Activity: Insufficiently Active   Days of Exercise per Week: 3 days   Minutes of Exercise per Session: 30 min  Stress: No Stress Concern Present   Feeling of Stress : Not at all  Social Connections: Socially Integrated   Frequency of Communication with Friends and Family: More than three times a week   Frequency of Social Gatherings with Friends and Family: More than three times a week   Attends Religious Services: More than 4 times per year   Active Member of [employer] or Organizations: Yes   Attends Archivist Meetings: 1 to 4 times per year   Marital Status: Married     Family History: The patient's family history includes Cancer in his mother; Heart attack in his father.  ROS:   Please see the history of present illness.    All other systems reviewed and are negative.  EKGs/Labs/Other Studies  Reviewed:    The following studies were reviewed today:    TAVR OPERATIVE NOTE     Date of Procedure:                04/28/2021   Preoperative Diagnosis:      Severe Aortic Stenosis   Postoperative Diagnosis:    Same   Procedure:        Transcatheter Aortic Valve Replacement - Percutaneous Right Transfemoral Approach             Medtronic CoreValve Evolut Pro Plus (size 29 mm, serial # [id number])              Co-Surgeons:                        Valentina Gu. [name], MD and Lauree Chandler, MD   Anesthesiologist:                  Myrtie Soman, MD   Echocardiographer:              Jenkins Rouge, MD   Pre-operative Echo Findings: Severe aortic stenosis Low normal left ventricular systolic function   Post-operative Echo Findings: Trivial paravalvular leak Unchanged left ventricular systolic function   _______________   Echo 05/12/21 IMPRESSIONS   1. Septal , apical and inferior wall hypokinesis . Left ventricular  ejection fraction, by estimation, is 40 to 45%. The left ventricle has  mildly decreased function. The left  ventricle demonstrates regional wall  motion abnormalities (see scoring  diagram/findings for description). The left ventricular internal cavity  size was mildly dilated. There is moderate left ventricular hypertrophy.  Left ventricular diastolic parameters are indeterminate.   2. Right ventricular systolic function is normal. The right ventricular  size is normal. There is normal pulmonary artery systolic pressure.   3. Left atrial size was moderately dilated.   4. Right atrial size was mildly dilated.   5. The mitral valve is abnormal. Mild mitral valve regurgitation. No  evidence of mitral stenosis. Moderate mitral annular calcification.   6. Post TAVR 04/28/21 with 29 mm Medtronic Evolut Pro valve Trivial PVL  seen best on PSSA images 6:00 mean gradient 4 peak 6 mmHg AVR 2.8 cm2. The  aortic valve has been repaired/replaced. Aortic valve regurgitation is  not  visualized. No aortic stenosis  is present. Procedure Date: 0712/2022.   7. The inferior vena cava is normal in size with greater than 50%  respiratory variability, suggesting right atrial pressure of 3 mmHg.   ___________________  Echo 06/04/21 IMPRESSIONS     1. 29 mm Corevalve TAVR (04/28/2021). Vmax 1.2 m/s, MG 3 mmHG, EOA 3.33  cm2, DI 0.74. Trivial paravalvular leak noted. Normal prosthesis . The  aortic valve has been repaired/replaced. Aortic valve regurgitation is  trivial. There is a 29 mm Medtronic  Evolut Pro( TAVR) valve present in the aortic position. Procedure Date:  04/28/2021.   2. Left ventricular ejection fraction, by estimation, is 40 to 45%. The  left ventricle has mildly decreased function. The left ventricle  demonstrates global hypokinesis. There is moderate asymmetric left  ventricular hypertrophy of the basal-septal  segment. Left ventricular diastolic function could not be evaluated.   3. Right ventricular systolic function is mildly reduced. The right  ventricular size is mildly enlarged. There is mildly elevated pulmonary  artery systolic pressure. The estimated right ventricular systolic  pressure is 02.7 mmHg.   4. Left atrial size was mildly dilated.   5. Right atrial size was mildly dilated.   6. The mitral valve is grossly normal. Mild mitral valve regurgitation.  No evidence of mitral stenosis.   7. The inferior vena cava is normal in size with <50% respiratory  variability, suggesting right atrial pressure of 8 mmHg.   Comparison(s): No significant change from prior study.   EKG:  EKG is NOT ordered today.  Recent Labs: 09/21/2020: TSH 2.809 04/27/2021: ALT 19; B Natriuretic Peptide 695.0 04/29/2021: Magnesium 1.8 05/13/2021: NT-Pro BNP 10,919 05/18/2021: Hemoglobin 13.0; Platelets 225 05/20/2021: BUN 29; Creatinine, Ser 2.07; Potassium 4.3; Sodium 133  Recent Lipid Panel    Component Value Date/Time   CHOL 97 (L) 01/01/2021 0848   TRIG  92 01/01/2021 0848   TRIG 306 (HH) 09/27/2006 1154   HDL 33 (L) 01/01/2021 0848   CHOLHDL 2.9 01/01/2021 0848   CHOLHDL 4 01/14/2020 0854   VLDL 26.4 01/14/2020 0854   LDLCALC 46 01/01/2021 0848   LDLDIRECT 143.3 11/23/2007 0000     Risk Assessment/Calculations:    CHA2DS2-VASc Score = 5  This indicates a 7.2% annual risk of stroke. The patient's score is based upon: CHF History: Yes HTN History: Yes Diabetes History: Yes Stroke History: No Vascular Disease History: No Age Score: 2 Gender Score: 0    Physical Exam:    VS:  BP 140/60 (BP Location: Left Arm, Patient Position: Sitting, Cuff Size: Normal)   Pulse 78   Ht '5\' 9"'  (1.753 m)  Wt 173 lb (78.5 kg)   SpO2 98%   BMI 25.55 kg/m     Wt Readings from Last 3 Encounters:  06/04/21 173 lb (78.5 kg)  05/20/21 169 lb (76.7 kg)  05/15/21 174 lb (78.9 kg)     GEN:  Well nourished, well developed in no acute distress HEENT: Normal NECK: mild JVD LYMPHATICS: No lymphadenopathy CARDIAC: irreg irreg, no murmurs, rubs, gallops RESPIRATORY:clear  ABDOMEN: Soft, non-tender, non-distended MUSCULOSKELETAL: trace pretibial edema.  SKIN: Warm and dry.  NEUROLOGIC:  Alert and oriented x 3 PSYCHIATRIC:  Normal affect   ASSESSMENT:    1. S/P TAVR (transcatheter aortic valve replacement)   2. Coronary artery disease involving native heart with other form of angina pectoris, unspecified vessel or lesion type (HCC)   3. Stage 3 chronic kidney disease, unspecified whether stage 3a or 3b CKD ([place])   4. Chronic diastolic (congestive) heart failure ([place])   5. Fever, unspecified fever cause   6. Bilateral carotid artery stenosis     PLAN:    In order of problems listed above:  Severe AS s/p TAVR: echo today shows EF 40-45%, normally functioning TAVR with a mean gradient of 2 mm hg and trivial PVL. He has NYHA class III symptoms but is volume overloaded currently. Continue on aspirin and Eliquis. SBE prophylaxis discussed; he  would like to hold off on any abx right now as he does not visit the dentist often. I will see him back next week for close follow up.   CAD with chest pain: most recent cath showed continued patency of the LIMA to LAD, saphenous vein graft to left posterior lateral branch, and saphenous vein graft to right PDA. He underwent successful POBA to SVG--> OM for ISR of 2 layers of stent on 09/23/20. He completed 1 month of triple therapy and 6 months of plavix and eliquis. Switched to aspirin and eliquis during most recent admission. Continue medical therapy. He had worsening chest pain and was started on imdur; however, he thinks the Imdur is causing worsening dyspnea and dizziness and would like to stop. Will stop this now.   CKD stage IIIB: creat ~1.7--> 2.05--> 1.9.--> 2.07. Recheck BMET today. We may have to accept worsening of his renal function to get him breathing better.   Acute on chronic diastolic CHF: it appears that he feels his best around a weight of 162 lbs. The problem is worsening of his renal function. I have increased his lasix from 40 mg in AM and 5m in PM to 635mBID. Goal weight will be around 165 lbs. I will see him back next week for follow up. If he is breathing better and creat remains stable with a GFR >30, I will consider adding Jardiance.  Fevers: CXR unremarkable and blood cultures NGTD. This has resolved.   Carortid arterial disease:  6064-68%ICA stenosis. Repeat dopplers due in 03/2022. Continue medical therapy.    Medication Adjustments/Labs and Tests Ordered: Current medicines are reviewed at length with the patient today.  Concerns regarding medicines are outlined above.  Orders Placed This Encounter  Procedures   Basic Metabolic Panel (BMET)    Meds ordered this encounter  Medications   furosemide (LASIX) 40 MG tablet    Sig: Take 1.5 tablets (60 mg total) by mouth 2 (two) times daily.    Dispense:  120 tablet    Refill:  6    Dose adjustment, please do not  fill at this time.     Patient  Instructions  Medication Instructions:  Your physician has recommended you make the following change in your medication:  1.) stop IMDUR  2.) increase lasix (furosemide) to 60 mg (ONE AND HALF TABLETS) twice a day   *If you need a refill on your cardiac medications before your next appointment, please call your pharmacy*   Lab Work: BMET today   Testing/Procedures: none   Follow-Up: As planned   Other Instructions     Signed, Angelena Form, PA-C  06/04/2021 3:50 PM    [place]

## 2021-06-05 LAB — BASIC METABOLIC PANEL
BUN/Creatinine Ratio: 14 (ref 10–24)
BUN: 25 mg/dL (ref 8–27)
CO2: 21 mmol/L (ref 20–29)
Calcium: 9.8 mg/dL (ref 8.6–10.2)
Chloride: 101 mmol/L (ref 96–106)
Creatinine, Ser: 1.79 mg/dL — ABNORMAL HIGH (ref 0.76–1.27)
Glucose: 154 mg/dL — ABNORMAL HIGH (ref 65–99)
Potassium: 4.4 mmol/L (ref 3.5–5.2)
Sodium: 140 mmol/L (ref 134–144)
eGFR: 36 mL/min/{1.73_m2} — ABNORMAL LOW (ref >59)

## 2021-06-10 NOTE — Progress Notes (Addendum)
[facility] [place]                                     Cardiology Office Note:    Date:  06/11/2021   ID:  [name], DOB [birth year], MRN [id number]  PCP:  [name], MD  [facility] Cardiologist:  [name], MD / Dr. Angelena Form & Dr. [name]  (TAVR) [place] Electrophysiologist:  None   Referring MD: [name], MD   Follow up CHF.  History of Present Illness:    [name] is a 87 y.o. male with a hx of arthritis, COPD, LBBB, CAD s/p 4V CABG in 2009 by PVT and multiple subsequent PCIs, CKD stage 3-4, diabetes mellitus, GERD, HTN, HLD, carotid artery disease, chronic diastolic CHF with most recent admission in 09/2020, persistent atrial fibrillation on Eliquis, prostate cancer and severe LFLG aortic stenosis s/p TAVR (04/28/21) who presents to clinic for follow up.   Patient's cardiac history dates back to 2008 when he underwent multivessel coronary artery bypass grafting by Dr. Prescott Gum.  He has been followed intermittently ever since by Dr. Percival Spanish.   Patient has a long history of CAD s/p CABG x4 with subsequent PCIs. Cardiopulmonary exercise test in 2020 showed mixed restrictive/obstructive pattern but did have submaximal effort during exercise. Heart monitor in 09/2019 showed 100% atrial fibrillation with controlled ventricular rate and ventricular bigeminy/trigeminy. After he was adequately anticoagulated, he underwent DCCV in 10/2019. Immediately following DCCV, he remained in atrial fibrillation. However, post-procedure ECG did show normal sinus rhythm. Unfortunately, he was back in atrial fibrillation at follow-up visit in 12/2019. He continued to complain of dyspnea at this time and was referred to Pulmonology.  He was felt to have Moderate obstruction with moderate restriction; positive bronchodilator response; decreased diffusion capacity by PFTs and started on inhalers.   He has undergone multiple  previous stent procedures for vein graft disease most recently in December 2021 when he underwent PCI of saphenous vein graft to the obtuse marginal branch of the left circumflex coronary artery.  Catheterization at that time revealed that all of the other bypass grafts remained widely patent and free of significant disease.  Patient has had aortic stenosis that has gradually progressed in severity. Recent follow-up echocardiogram performed Mar 12, 2021 revealed further progression of disease.  Left ventricular ejection fraction was estimated 45 to 50%.  The aortic valve is calcified with severe calcification, thickening, and restricted leaflet mobility involving all 3 leaflets.  Peak velocity across the aortic valve measured 2.7 m/s corresponding to mean transvalvular gradient estimated 21 mmHg but aortic valve area calculated only 0.63 cm with DVI notably quite low at 0.20 and stroke-volume index only 21.   He was evaluated by the multidisciplinary valve team and underwent successful TAVR with a 29 mm Medtronic Evolut Pro + THV via the TF approach on 04/28/21. Post operative echo showed EF 40%, normally functioning TAVR with a mean gradient of 4 mmHg and trivial PVL. Plavix was discontinued and he was started on aspirin 81 mg daily and resumed on Eliquis. Of note, he was admitted the day before his case due to chest pain while being evaluated at preadmission testing.   I saw him for post hospital follow up on 7/27 and he complained of worsening chest pain as well as fevers, weight gain, LE edema, orthopnea, cough and  PND. He was noted to be volume overloaded on exam and up 8 lbs with a weight of 178 lbs. Follow up labs showed creat 1.7, nt BNP ~11K, WBC 11.2, Hg 12.1. I increased his lasix from 17m BID to 80 mg BID and started him on Imdur. Follow-up labs showed creatinine had increased to 2.05 and he had gotten down to 162 lbs with a big improvement in breathing. Lasix changed to 423min AM and 2047mn  PM.  I saw him back on 8/18 for follow up and feeling "terrible again." Weight up to 173 lbs. We stopped imdur as patient felt like this was contributing to shortness of breath and dizziness.  One month echo s/p TAVR showed EF 40-45%, normally functioning TAVR with a mean gradient of 2 mm hg and trivial PVL. His lasix was increased to 41m59mD.   Today he presents to clinic for follow-up. Doing better. Cannot tell a big difference in UOP but weight is slowly coming down. Breathing is still labored. Has felt a little better since last week. Has had some chest pain but not as bad. Sometimes shoulder and arm hurts. Not sure is it is related to a shoulder injury. Sometimes feels his heart racing with palpitations. Has had excessive bruising on arms which bothers him   Past Medical History:  Diagnosis Date   Aortic stenosis, moderate 09/20/2020   Arthritis    CAD (coronary artery disease)    a. Cath September 2015 LIMA to the LAD patent, SVG to PDA patent, SVG to posterior lateral patent, SVG to OM with a 90% in-stent restenosis at an anastomotic lesion. This was treated with angioplasty. b. cath 04/01/2015 95% ISR in SVG to OM treated with 2.75x24 Synergy DES postdilated to 3.3mm,62ml other grafts patent   Cataract    surgery,B/L   CHF (congestive heart failure) (HCC)    Chronic kidney disease    nephrolithiasis   Diabetes mellitus    TYPE 2   GERD (gastroesophageal reflux disease)    Hernia    Hypercholesterolemia    Hypertension    Incontinence    hx  over 1 year,leaks without awareness   Other and unspecified diseases of appendix    PAF (paroxysmal atrial fibrillation) (HCC) [place] the setting of ischemia on 03/31/2015   Pancreatitis    Prostate CA (HCC) [place]5/05   prostate bx=Adenocarcinoam,gleason 3+4=7,PSA=6.75   Shortness of breath dyspnea    Ulcer    hx gastric    Past Surgical History:  Procedure Laterality Date   APPENDECTOMY     BACK SURGERY     CARDIAC CATHETERIZATION N/A  04/01/2015   Procedure: Left Heart Cath and Cors/Grafts Angiography;  Surgeon: JayadJettie Booze  [facility];  Service: Cardiovascular;  Laterality: N/A;   CARDIAC CATHETERIZATION N/A 04/01/2015   Procedure: Coronary Stent Intervention;  Surgeon: JayadJettie Booze  [facility];  Service: Cardiovascular;  Laterality: N/A;   CARDIOVERSION N/A 11/01/2019   Procedure: CARDIOVERSION;  Surgeon: O'NeaGeralynn Rile  [facility];  Service: Cardiovascular;  Laterality: N/A;   CARPAL TUNNEL RELEASE  2004   both hands   CORONARY ARTERY BYPASS GRAFT     CORONARY STENT INTERVENTION N/A 09/23/2020   Procedure: CORONARY STENT INTERVENTION;  Surgeon: CoopeSherren Mocha  [facility];  Service: Cardiovascular;  Laterality: N/A;   CYSTOSCOPY  08/23/12   incomplete emoptying bladder   HERNIA REPAIR  incision and drainage of right chest abscess     INTRAOPERATIVE TRANSTHORACIC ECHOCARDIOGRAM Left 04/28/2021   Procedure: INTRAOPERATIVE TRANSTHORACIC ECHOCARDIOGRAM;  Surgeon: Burnell Blanks, MD;  Location: [facility];  Service: Open Heart Surgery;  Laterality: Left;   LAPAROSCOPIC CHOLECYSTECTOMY  09/2009   LEFT HEART CATHETERIZATION WITH CORONARY ANGIOGRAM N/A 07/19/2014   Procedure: LEFT HEART CATHETERIZATION WITH CORONARY ANGIOGRAM;  Surgeon: Leonie Man, MD;  Location: [facility] CATH LAB;  Service: Cardiovascular;  Laterality: N/A;   RECTAL SURGERY     RIGHT/LEFT HEART CATH AND CORONARY/GRAFT ANGIOGRAPHY N/A 09/23/2020   Procedure: RIGHT/LEFT HEART CATH AND CORONARY/GRAFT ANGIOGRAPHY;  Surgeon: Sherren Mocha, MD;  Location: [place] CV LAB;  Service: Cardiovascular;  Laterality: N/A;   ROBOT ASSISTED LAPAROSCOPIC RADICAL PROSTATECTOMY  2005   TRANSCATHETER AORTIC VALVE REPLACEMENT, TRANSFEMORAL Bilateral 04/28/2021   Procedure: TRANSCATHETER AORTIC VALVE REPLACEMENT, TRANSFEMORAL;  Surgeon: Burnell Blanks, MD;   Location: [place];  Service: Open Heart Surgery;  Laterality: Bilateral;   ULTRASOUND GUIDANCE FOR VASCULAR ACCESS Bilateral 04/28/2021   Procedure: ULTRASOUND GUIDANCE FOR VASCULAR ACCESS;  Surgeon: Burnell Blanks, MD;  Location: [place];  Service: Open Heart Surgery;  Laterality: Bilateral;    Current Medications: Current Meds  Medication Sig   albuterol (VENTOLIN HFA) 108 (90 Base) MCG/ACT inhaler Inhale 2 puffs into the lungs every 6 (six) hours as needed.   amLODipine (NORVASC) 5 MG tablet Take 1 tablet (5 mg total) by mouth daily.   apixaban (ELIQUIS) 2.5 MG TABS tablet Take 1 tablet (2.5 mg total) by mouth 2 (two) times daily.   blood glucose meter kit and supplies KIT Dispense based on patient and insurance preference. Use up to four times daily as directed. (FOR ICD-9 250.00, 250.01).   budesonide-formoterol (SYMBICORT) 160-4.5 MCG/ACT inhaler Inhale 2 puffs into the lungs in the morning and at bedtime.   Carboxymethylcellul-Glycerin (LUBRICATING EYE DROPS OP) Place 1 drop into both eyes daily as needed (dry eyes).   carvedilol (COREG) 6.25 MG tablet TAKE 1 TABLET (6.25 MG TOTAL) BY MOUTH 2 (TWO) TIMES DAILY WITH A MEAL.   docusate sodium (STOOL SOFTENER) 100 MG capsule Take 1 capsule (100 mg total) by mouth 2 (two) times daily as needed for mild constipation.   FLUoxetine (PROZAC) 10 MG capsule TAKE 1 CAPSULE BY MOUTH EVERY DAY   furosemide (LASIX) 40 MG tablet Take 1.5 tablets (60 mg total) by mouth 2 (two) times daily.   Homeopathic Products [facility] RELIEF EX) Apply 1 application topically daily as needed (knee pain).   hydrocortisone 2.5 % cream Apply 1 application topically daily as needed (facial breakouts).   JANUVIA 50 MG tablet TAKE 1 TABLET BY MOUTH EVERY DAY   Multiple Vitamins-Minerals (ICAPS AREDS 2 PO) Take 1 capsule by mouth daily.   nitroGLYCERIN (NITROSTAT) 0.4 MG SL tablet PLACE 1 TABLET UNDER THE TONGUE EVERY 5 MINUTES X 3 DOSES AS NEEDED FOR CHEST PAIN    pantoprazole (PROTONIX) 40 MG tablet TAKE 1 TABLET BY MOUTH EVERY DAY   rosuvastatin (CRESTOR) 20 MG tablet TAKE 1 TABLET BY MOUTH EVERY DAY   spironolactone (ALDACTONE) 25 MG tablet Take 1 tablet (25 mg total) by mouth daily.   [DISCONTINUED] aspirin 81 MG chewable tablet Chew 1 tablet (81 mg total) by mouth daily.     Allergies:   Codeine and Morphine   Social History   Socioeconomic History   Marital status: Married    Spouse name: Not on file   Number of children: 2  Years of education: Not on file   Highest education level: Not on file  Occupational History    Employer: RETIRED    Comment: Retired  Tobacco Use   Smoking status: Former    Packs/day: 0.50    Years: 5.00    Pack years: 2.50    Types: Cigarettes    Quit date: 07/20/1972    Years since quitting: 48.9   Smokeless tobacco: Never   Tobacco comments:    quit s  Vaping Use   Vaping Use: Never used  Substance and Sexual Activity   Alcohol use: No   Drug use: No    Comment: quit smoking 40 years ago   Sexual activity: Never  Other Topics Concern   Not on file  Social History Narrative   Retired   Married      Social Determinants of Radio broadcast assistant Strain: Low Risk    Difficulty of Paying Living Expenses: Not hard at all  Food Insecurity: No Food Insecurity   Worried About Charity fundraiser in the Last Year: Never true   Arboriculturist in the Last Year: Never true  Transportation Needs: No Transportation Needs   Lack of Transportation (Medical): No   Lack of Transportation (Non-Medical): No  Physical Activity: Insufficiently Active   Days of Exercise per Week: 3 days   Minutes of Exercise per Session: 30 min  Stress: No Stress Concern Present   Feeling of Stress : Not at all  Social Connections: Socially Integrated   Frequency of Communication with Friends and Family: More than three times a week   Frequency of Social Gatherings with Friends and Family: More than three times a week    Attends Religious Services: More than 4 times per year   Active Member of [employer] or Organizations: Yes   Attends Archivist Meetings: 1 to 4 times per year   Marital Status: Married     Family History: The patient's family history includes Cancer in his mother; Heart attack in his father.  ROS:   Please see the history of present illness.    All other systems reviewed and are negative.  EKGs/Labs/Other Studies Reviewed:    The following studies were reviewed today:    TAVR OPERATIVE NOTE     Date of Procedure:                04/28/2021   Preoperative Diagnosis:      Severe Aortic Stenosis   Postoperative Diagnosis:    Same   Procedure:        Transcatheter Aortic Valve Replacement - Percutaneous Right Transfemoral Approach             Medtronic CoreValve Evolut Pro Plus (size 29 mm, serial # [id number])              Co-Surgeons:                        Valentina Gu. [name], MD and Lauree Chandler, MD   Anesthesiologist:                  Myrtie Soman, MD   Echocardiographer:              Jenkins Rouge, MD   Pre-operative Echo Findings: Severe aortic stenosis Low normal left ventricular systolic function   Post-operative Echo Findings: Trivial paravalvular leak Unchanged left ventricular systolic function   _______________   Echo 05/12/21 IMPRESSIONS  1. Septal , apical and inferior wall hypokinesis . Left ventricular  ejection fraction, by estimation, is 40 to 45%. The left ventricle has  mildly decreased function. The left ventricle demonstrates regional wall  motion abnormalities (see scoring  diagram/findings for description). The left ventricular internal cavity  size was mildly dilated. There is moderate left ventricular hypertrophy.  Left ventricular diastolic parameters are indeterminate.   2. Right ventricular systolic function is normal. The right ventricular  size is normal. There is normal pulmonary artery systolic pressure.   3. Left atrial  size was moderately dilated.   4. Right atrial size was mildly dilated.   5. The mitral valve is abnormal. Mild mitral valve regurgitation. No  evidence of mitral stenosis. Moderate mitral annular calcification.   6. Post TAVR 04/28/21 with 29 mm Medtronic Evolut Pro valve Trivial PVL  seen best on PSSA images 6:00 mean gradient 4 peak 6 mmHg AVR 2.8 cm2. The  aortic valve has been repaired/replaced. Aortic valve regurgitation is not  visualized. No aortic stenosis  is present. Procedure Date: 0712/2022.   7. The inferior vena cava is normal in size with greater than 50%  respiratory variability, suggesting right atrial pressure of 3 mmHg.   ___________________  Echo 06/04/21 IMPRESSIONS  1. 29 mm Corevalve TAVR (04/28/2021). Vmax 1.2 m/s, MG 3 mmHG, EOA 3.33  cm2, DI 0.74. Trivial paravalvular leak noted. Normal prosthesis . The  aortic valve has been repaired/replaced. Aortic valve regurgitation is  trivial. There is a 29 mm Medtronic  Evolut Pro( TAVR) valve present in the aortic position. Procedure Date:  04/28/2021.   2. Left ventricular ejection fraction, by estimation, is 40 to 45%. The  left ventricle has mildly decreased function. The left ventricle  demonstrates global hypokinesis. There is moderate asymmetric left  ventricular hypertrophy of the basal-septal  segment. Left ventricular diastolic function could not be evaluated.   3. Right ventricular systolic function is mildly reduced. The right  ventricular size is mildly enlarged. There is mildly elevated pulmonary  artery systolic pressure. The estimated right ventricular systolic  pressure is 16.1 mmHg.   4. Left atrial size was mildly dilated.   5. Right atrial size was mildly dilated.   6. The mitral valve is grossly normal. Mild mitral valve regurgitation.  No evidence of mitral stenosis.   7. The inferior vena cava is normal in size with <50% respiratory  variability, suggesting right atrial pressure of 8 mmHg.    Comparison(s): No significant change from prior study.   EKG:  EKG is NOT ordered today.  Recent Labs: 09/21/2020: TSH 2.809 04/27/2021: ALT 19; B Natriuretic Peptide 695.0 04/29/2021: Magnesium 1.8 05/13/2021: NT-Pro BNP 10,919 05/18/2021: Hemoglobin 13.0; Platelets 225 06/04/2021: BUN 25; Creatinine, Ser 1.79; Potassium 4.4; Sodium 140  Recent Lipid Panel    Component Value Date/Time   CHOL 97 (L) 01/01/2021 0848   TRIG 92 01/01/2021 0848   TRIG 306 (HH) 09/27/2006 1154   HDL 33 (L) 01/01/2021 0848   CHOLHDL 2.9 01/01/2021 0848   CHOLHDL 4 01/14/2020 0854   VLDL 26.4 01/14/2020 0854   LDLCALC 46 01/01/2021 0848   LDLDIRECT 143.3 11/23/2007 0000     Risk Assessment/Calculations:    CHA2DS2-VASc Score = 5  This indicates a 7.2% annual risk of stroke. The patient's score is based upon: CHF History: Yes HTN History: Yes Diabetes History: Yes Stroke History: No Vascular Disease History: No Age Score: 2 Gender Score: 0    Physical Exam:  VS:  BP 130/68   Pulse 67   Ht _0  (1.753 m)   Wt 171 lb 9.6 oz (77.8 kg)   SpO2 97%   BMI 25.34 kg/m     Wt Readings from Last 3 Encounters:  06/11/21 171 lb 9.6 oz (77.8 kg)  06/04/21 173 lb (78.5 kg)  05/20/21 169 lb (76.7 kg)     GEN:  Well nourished, well developed in no acute distress HEENT: Normal NECK: mild JVD LYMPHATICS: No lymphadenopathy CARDIAC: irreg irreg, no murmurs, rubs, gallops RESPIRATORY:clear  ABDOMEN: Soft, non-tender, non-distended MUSCULOSKELETAL: trace pretibial edema.  SKIN: Warm and dry.  NEUROLOGIC:  Alert and oriented x 3 PSYCHIATRIC:  Normal affect   ASSESSMENT:    1. S/P TAVR (transcatheter aortic valve replacement)   2. Coronary artery disease involving native heart with other form of angina pectoris, unspecified vessel or lesion type (HCC)   3. Stage 3 chronic kidney disease, unspecified whether stage 3a or 3b CKD ([place])   4. Chronic diastolic (congestive) heart failure ([place])   5.  Bilateral carotid artery stenosis     PLAN:    In order of problems listed above:   Severe AS s/p TAVR: echo 8/18 showed EF 40-45%, normally functioning TAVR with a mean gradient of 2 mm hg and trivial PVL. He has been on eliquis and aspirin with excessive bruising so will plan to stop aspirin now.   CAD with chest pain: most recent cath showed continued patency of the LIMA to LAD, saphenous vein graft to left posterior lateral branch, and saphenous vein graft to right PDA. He underwent successful POBA to SVG--> OM for ISR of 2 layers of stent on 09/23/20. He completed 1 month of triple therapy and 6 months of plavix and eliquis. Switched to aspirin and eliquis during most recent admission. He has been having excessive bruising on aspirin and this has been discontinued as it has been ~ 9 months since POBA.  CKD stage IIIB: creat ~1.7--> 2.05--> 1.9.--> 2.07--> 1.79. Lasix increased last visit. Recheck BMET today. We may have to accept worsening of his renal function to get him breathing better.   Acute on chronic diastolic CHF: Goal weight will be around 165 lbs at home. Weight in high 160s at home and feeling better. Will check BMET today and if GFR >30, I will consider adding Jardiance.  Carotid arterial disease: 09-60% LICA stenosis. Repeat dopplers due in 03/2022. Continue medical therapy.   Persistent atrial fibrillation: continue Eliquis. I wonder if his symptoms if dyspnea and fatigue are related to afib. He thinks he felt better after DCCV in 10/2019, which has early ERAF. Echo only show mild LAE. I wonder if it would be of utility to try amiodarone and cardioversion to see getting back into sinus would help his symptoms. Discussed with Dr. Percival Spanish who didn't think this would make a big difference in his symptoms.  Medication Adjustments/Labs and Tests Ordered: Current medicines are reviewed at length with the patient today.  Concerns regarding medicines are outlined above.  Orders Placed  This Encounter  Procedures   Basic Metabolic Panel (BMET)     No orders of the defined types were placed in this encounter.    Patient Instructions  Medication Instructions:  Your physician has recommended you make the following change in your medication:  1-STOP Aspirin  *If you need a refill on your cardiac medications before your next appointment, please call your pharmacy*  Lab Work: Your physician recommends that you have  lab work today- BMET  If you have labs (blood work) drawn today and your tests are completely normal, you will receive your results only by: [employer] (if you have MyChart) OR A paper copy in the mail If you have any lab test that is abnormal or we need to change your treatment, we will call you to review the results.  Testing/Procedures: None ordered today.  Follow-Up: At [facility], you and your health needs are our priority.  As part of our continuing mission to provide you with exceptional heart care, we have created designated Provider Care Teams.  These Care Teams include your primary Cardiologist (physician) and Advanced Practice Providers (APPs -  Physician Assistants and Nurse Practitioners) who all work together to provide you with the care you need, when you need it.  We recommend signing up for the patient portal called "MyChart".  Sign up information is provided on this After Visit Summary.  MyChart is used to connect with patients for Virtual Visits (Telemedicine).  Patients are able to view lab/test results, encounter notes, upcoming appointments, etc.  Non-urgent messages can be sent to your provider as well.   To learn more about what you can do with MyChart, go to [email or web address].    Your next appointment:   Next available   The format for your next appointment:   In Person  Provider:   You may see [name], MD or one of the following Advanced Practice Providers on your designated Care Team:   Rosaria Ferries,  PA-C Caron Presume, PA-C Jory Sims, DNP, ANP   Signed, Angelena Form, PA-C  06/11/2021 2:13 PM    [place]

## 2021-06-11 ENCOUNTER — Encounter: Payer: Self-pay | Admitting: Physician Assistant

## 2021-06-11 ENCOUNTER — Other Ambulatory Visit: Payer: Self-pay

## 2021-06-11 ENCOUNTER — Ambulatory Visit: Payer: Medicare PPO | Admitting: Physician Assistant

## 2021-06-11 VITALS — BP 130/68 | HR 67 | Ht 69.0 in | Wt 171.6 lb

## 2021-06-11 DIAGNOSIS — I25118 Atherosclerotic heart disease of native coronary artery with other forms of angina pectoris: Secondary | ICD-10-CM

## 2021-06-11 DIAGNOSIS — I6523 Occlusion and stenosis of bilateral carotid arteries: Secondary | ICD-10-CM

## 2021-06-11 DIAGNOSIS — N183 Chronic kidney disease, stage 3 unspecified: Secondary | ICD-10-CM

## 2021-06-11 DIAGNOSIS — I5032 Chronic diastolic (congestive) heart failure: Secondary | ICD-10-CM | POA: Diagnosis not present

## 2021-06-11 DIAGNOSIS — Z952 Presence of prosthetic heart valve: Secondary | ICD-10-CM | POA: Diagnosis not present

## 2021-06-11 NOTE — Patient Instructions (Addendum)
Medication Instructions:  Your physician has recommended you make the following change in your medication:  1-STOP Aspirin  *If you need a refill on your cardiac medications before your next appointment, please call your pharmacy*  Lab Work: Your physician recommends that you have lab work today- BMET  If you have labs (blood work) drawn today and your tests are completely normal, you will receive your results only by: [employer] (if you have MyChart) OR A paper copy in the mail If you have any lab test that is abnormal or we need to change your treatment, we will call you to review the results.  Testing/Procedures: None ordered today.  Follow-Up: At [facility], you and your health needs are our priority.  As part of our continuing mission to provide you with exceptional heart care, we have created designated Provider Care Teams.  These Care Teams include your primary Cardiologist (physician) and Advanced Practice Providers (APPs -  Physician Assistants and Nurse Practitioners) who all work together to provide you with the care you need, when you need it.  We recommend signing up for the patient portal called "MyChart".  Sign up information is provided on this After Visit Summary.  MyChart is used to connect with patients for Virtual Visits (Telemedicine).  Patients are able to view lab/test results, encounter notes, upcoming appointments, etc.  Non-urgent messages can be sent to your provider as well.   To learn more about what you can do with MyChart, go to [email or web address].    Your next appointment:   Next available   The format for your next appointment:   In Person  Provider:   You may see [name], MD or one of the following Advanced Practice Providers on your designated Care Team:   Rosaria Ferries, PA-C Caron Presume, PA-C Jory Sims, DNP, ANP

## 2021-06-12 ENCOUNTER — Other Ambulatory Visit: Payer: Self-pay | Admitting: Physician Assistant

## 2021-06-12 DIAGNOSIS — N183 Chronic kidney disease, stage 3 unspecified: Secondary | ICD-10-CM

## 2021-06-12 LAB — BASIC METABOLIC PANEL
BUN/Creatinine Ratio: 19 (ref 10–24)
BUN: 37 mg/dL — ABNORMAL HIGH (ref 8–27)
CO2: 24 mmol/L (ref 20–29)
Calcium: 9.9 mg/dL (ref 8.6–10.2)
Chloride: 99 mmol/L (ref 96–106)
Creatinine, Ser: 1.95 mg/dL — ABNORMAL HIGH (ref 0.76–1.27)
Glucose: 141 mg/dL — ABNORMAL HIGH (ref 65–99)
Potassium: 4.3 mmol/L (ref 3.5–5.2)
Sodium: 140 mmol/L (ref 134–144)
eGFR: 33 mL/min/{1.73_m2} — ABNORMAL LOW (ref >59)

## 2021-06-12 MED ORDER — EMPAGLIFLOZIN 10 MG PO TABS: 10.0000 mg | ORAL_TABLET | Freq: Every day | ORAL | 3 refills | Status: DC

## 2021-07-09 ENCOUNTER — Other Ambulatory Visit: Payer: Medicare PPO | Admitting: *Deleted

## 2021-07-09 ENCOUNTER — Other Ambulatory Visit: Payer: Self-pay

## 2021-07-09 DIAGNOSIS — N183 Chronic kidney disease, stage 3 unspecified: Secondary | ICD-10-CM | POA: Diagnosis not present

## 2021-07-09 LAB — BASIC METABOLIC PANEL
BUN/Creatinine Ratio: 14 (ref 10–24)
BUN: 31 mg/dL — ABNORMAL HIGH (ref 8–27)
CO2: 23 mmol/L (ref 20–29)
Calcium: 9.3 mg/dL (ref 8.6–10.2)
Chloride: 102 mmol/L (ref 96–106)
Creatinine, Ser: 2.2 mg/dL — ABNORMAL HIGH (ref 0.76–1.27)
Glucose: 144 mg/dL — ABNORMAL HIGH (ref 65–99)
Potassium: 3.1 mmol/L — ABNORMAL LOW (ref 3.5–5.2)
Sodium: 141 mmol/L (ref 134–144)
eGFR: 28 mL/min/{1.73_m2} — ABNORMAL LOW (ref >59)

## 2021-07-10 ENCOUNTER — Other Ambulatory Visit: Payer: Self-pay

## 2021-07-10 DIAGNOSIS — I5030 Unspecified diastolic (congestive) heart failure: Secondary | ICD-10-CM

## 2021-07-10 DIAGNOSIS — I350 Nonrheumatic aortic (valve) stenosis: Secondary | ICD-10-CM

## 2021-07-12 ENCOUNTER — Other Ambulatory Visit: Payer: Self-pay

## 2021-07-12 MED ORDER — FUROSEMIDE 40 MG PO TABS: 40.0000 mg | ORAL_TABLET | Freq: Two times a day (BID) | ORAL | 6 refills | Status: DC

## 2021-07-20 DIAGNOSIS — C61 Malignant neoplasm of prostate: Secondary | ICD-10-CM | POA: Diagnosis not present

## 2021-07-23 ENCOUNTER — Other Ambulatory Visit: Payer: Self-pay

## 2021-07-23 ENCOUNTER — Other Ambulatory Visit: Payer: Medicare PPO

## 2021-07-23 DIAGNOSIS — I5030 Unspecified diastolic (congestive) heart failure: Secondary | ICD-10-CM

## 2021-07-23 DIAGNOSIS — I350 Nonrheumatic aortic (valve) stenosis: Secondary | ICD-10-CM | POA: Diagnosis not present

## 2021-07-23 LAB — BASIC METABOLIC PANEL
BUN/Creatinine Ratio: 10 (ref 10–24)
BUN: 23 mg/dL (ref 8–27)
CO2: 23 mmol/L (ref 20–29)
Calcium: 9.4 mg/dL (ref 8.6–10.2)
Chloride: 101 mmol/L (ref 96–106)
Creatinine, Ser: 2.22 mg/dL — ABNORMAL HIGH (ref 0.76–1.27)
Glucose: 141 mg/dL — ABNORMAL HIGH (ref 70–99)
Potassium: 3.5 mmol/L (ref 3.5–5.2)
Sodium: 141 mmol/L (ref 134–144)
eGFR: 28 mL/min/{1.73_m2} — ABNORMAL LOW (ref >59)

## 2021-07-24 ENCOUNTER — Other Ambulatory Visit: Payer: Self-pay | Admitting: Cardiology

## 2021-07-27 DIAGNOSIS — C61 Malignant neoplasm of prostate: Secondary | ICD-10-CM | POA: Diagnosis not present

## 2021-07-27 DIAGNOSIS — R9721 Rising PSA following treatment for malignant neoplasm of prostate: Secondary | ICD-10-CM | POA: Diagnosis not present

## 2021-07-27 DIAGNOSIS — N3946 Mixed incontinence: Secondary | ICD-10-CM | POA: Diagnosis not present

## 2021-08-16 ENCOUNTER — Other Ambulatory Visit: Payer: Self-pay | Admitting: Family Medicine

## 2021-08-16 ENCOUNTER — Other Ambulatory Visit: Payer: Self-pay | Admitting: Internal Medicine

## 2021-08-16 DIAGNOSIS — J4540 Moderate persistent asthma, uncomplicated: Secondary | ICD-10-CM

## 2021-08-18 ENCOUNTER — Other Ambulatory Visit: Payer: Self-pay

## 2021-08-18 ENCOUNTER — Emergency Department (HOSPITAL_COMMUNITY): Payer: Medicare PPO

## 2021-08-18 ENCOUNTER — Inpatient Hospital Stay (HOSPITAL_COMMUNITY)
Admission: EM | Admit: 2021-08-18 | Discharge: 2021-08-20 | DRG: 251 | Disposition: A | Discharge status: Home / Self Care | Payer: Medicare PPO | Attending: Cardiology | Admitting: Cardiology

## 2021-08-18 ENCOUNTER — Telehealth: Payer: Self-pay | Admitting: Cardiology

## 2021-08-18 DIAGNOSIS — I130 Hypertensive heart and chronic kidney disease with heart failure and stage 1 through stage 4 chronic kidney disease, or unspecified chronic kidney disease: Secondary | ICD-10-CM | POA: Diagnosis present

## 2021-08-18 DIAGNOSIS — R079 Chest pain, unspecified: Secondary | ICD-10-CM | POA: Diagnosis not present

## 2021-08-18 DIAGNOSIS — E1121 Type 2 diabetes mellitus with diabetic nephropathy: Secondary | ICD-10-CM | POA: Diagnosis present

## 2021-08-18 DIAGNOSIS — I4819 Other persistent atrial fibrillation: Secondary | ICD-10-CM | POA: Diagnosis present

## 2021-08-18 DIAGNOSIS — Z87891 Personal history of nicotine dependence: Secondary | ICD-10-CM

## 2021-08-18 DIAGNOSIS — Z7901 Long term (current) use of anticoagulants: Secondary | ICD-10-CM

## 2021-08-18 DIAGNOSIS — G4700 Insomnia, unspecified: Secondary | ICD-10-CM | POA: Diagnosis present

## 2021-08-18 DIAGNOSIS — I25810 Atherosclerosis of coronary artery bypass graft(s) without angina pectoris: Secondary | ICD-10-CM | POA: Diagnosis not present

## 2021-08-18 DIAGNOSIS — K219 Gastro-esophageal reflux disease without esophagitis: Secondary | ICD-10-CM | POA: Diagnosis present

## 2021-08-18 DIAGNOSIS — Z7984 Long term (current) use of oral hypoglycemic drugs: Secondary | ICD-10-CM | POA: Diagnosis not present

## 2021-08-18 DIAGNOSIS — Z951 Presence of aortocoronary bypass graft: Secondary | ICD-10-CM

## 2021-08-18 DIAGNOSIS — I214 Non-ST elevation (NSTEMI) myocardial infarction: Principal | ICD-10-CM | POA: Diagnosis present

## 2021-08-18 DIAGNOSIS — Z952 Presence of prosthetic heart valve: Secondary | ICD-10-CM

## 2021-08-18 DIAGNOSIS — Z8249 Family history of ischemic heart disease and other diseases of the circulatory system: Secondary | ICD-10-CM

## 2021-08-18 DIAGNOSIS — Z8546 Personal history of malignant neoplasm of prostate: Secondary | ICD-10-CM

## 2021-08-18 DIAGNOSIS — I2510 Atherosclerotic heart disease of native coronary artery without angina pectoris: Secondary | ICD-10-CM | POA: Diagnosis present

## 2021-08-18 DIAGNOSIS — I25110 Atherosclerotic heart disease of native coronary artery with unstable angina pectoris: Secondary | ICD-10-CM | POA: Diagnosis not present

## 2021-08-18 DIAGNOSIS — E1122 Type 2 diabetes mellitus with diabetic chronic kidney disease: Secondary | ICD-10-CM | POA: Diagnosis not present

## 2021-08-18 DIAGNOSIS — Z955 Presence of coronary angioplasty implant and graft: Secondary | ICD-10-CM

## 2021-08-18 DIAGNOSIS — R634 Abnormal weight loss: Secondary | ICD-10-CM | POA: Diagnosis not present

## 2021-08-18 DIAGNOSIS — Z23 Encounter for immunization: Secondary | ICD-10-CM | POA: Diagnosis not present

## 2021-08-18 DIAGNOSIS — Z79899 Other long term (current) drug therapy: Secondary | ICD-10-CM

## 2021-08-18 DIAGNOSIS — I447 Left bundle-branch block, unspecified: Secondary | ICD-10-CM | POA: Diagnosis present

## 2021-08-18 DIAGNOSIS — J449 Chronic obstructive pulmonary disease, unspecified: Secondary | ICD-10-CM | POA: Diagnosis not present

## 2021-08-18 DIAGNOSIS — I200 Unstable angina: Secondary | ICD-10-CM | POA: Diagnosis present

## 2021-08-18 DIAGNOSIS — I25700 Atherosclerosis of coronary artery bypass graft(s), unspecified, with unstable angina pectoris: Secondary | ICD-10-CM | POA: Diagnosis not present

## 2021-08-18 DIAGNOSIS — I4811 Longstanding persistent atrial fibrillation: Secondary | ICD-10-CM | POA: Diagnosis not present

## 2021-08-18 DIAGNOSIS — Z7951 Long term (current) use of inhaled steroids: Secondary | ICD-10-CM

## 2021-08-18 DIAGNOSIS — I5032 Chronic diastolic (congestive) heart failure: Secondary | ICD-10-CM | POA: Diagnosis not present

## 2021-08-18 DIAGNOSIS — Z20822 Contact with and (suspected) exposure to covid-19: Secondary | ICD-10-CM | POA: Diagnosis not present

## 2021-08-18 DIAGNOSIS — Z9861 Coronary angioplasty status: Secondary | ICD-10-CM

## 2021-08-18 DIAGNOSIS — N1832 Chronic kidney disease, stage 3b: Secondary | ICD-10-CM | POA: Diagnosis present

## 2021-08-18 DIAGNOSIS — E7800 Pure hypercholesterolemia, unspecified: Secondary | ICD-10-CM | POA: Diagnosis present

## 2021-08-18 LAB — CBG MONITORING, ED: Glucose-Capillary: 232 mg/dL — ABNORMAL HIGH (ref 70–99)

## 2021-08-18 LAB — BASIC METABOLIC PANEL
Anion gap: 11 (ref 5–15)
BUN: 37 mg/dL — ABNORMAL HIGH (ref 8–23)
CO2: 23 mmol/L (ref 22–32)
Calcium: 9.8 mg/dL (ref 8.9–10.3)
Chloride: 95 mmol/L — ABNORMAL LOW (ref 98–111)
Creatinine, Ser: 2.06 mg/dL — ABNORMAL HIGH (ref 0.61–1.24)
GFR, Estimated: 31 mL/min — ABNORMAL LOW (ref >60)
Glucose, Bld: 260 mg/dL — ABNORMAL HIGH (ref 70–99)
Potassium: 3.7 mmol/L (ref 3.5–5.1)
Sodium: 129 mmol/L — ABNORMAL LOW (ref 135–145)

## 2021-08-18 LAB — T4, FREE: Free T4: 1.02 ng/dL (ref 0.61–1.12)

## 2021-08-18 LAB — CBC
HCT: 44.1 % (ref 39.0–52.0)
Hemoglobin: 14.1 g/dL (ref 13.0–17.0)
MCH: 25.5 pg — ABNORMAL LOW (ref 26.0–34.0)
MCHC: 32 g/dL (ref 30.0–36.0)
MCV: 79.9 fL — ABNORMAL LOW (ref 80.0–100.0)
Platelets: 147 10*3/uL — ABNORMAL LOW (ref 150–400)
RBC: 5.52 MIL/uL (ref 4.22–5.81)
RDW: 15.8 % — ABNORMAL HIGH (ref 11.5–15.5)
WBC: 14.8 10*3/uL — ABNORMAL HIGH (ref 4.0–10.5)
nRBC: 0 % (ref 0.0–0.2)

## 2021-08-18 LAB — TSH: TSH: 2.079 u[IU]/mL (ref 0.350–4.500)

## 2021-08-18 LAB — TROPONIN I (HIGH SENSITIVITY)
Troponin I (High Sensitivity): 78 ng/L — ABNORMAL HIGH (ref <18)
Troponin I (High Sensitivity): 90 ng/L — ABNORMAL HIGH (ref <18)

## 2021-08-18 LAB — BRAIN NATRIURETIC PEPTIDE: B Natriuretic Peptide: 590.2 pg/mL — ABNORMAL HIGH (ref 0.0–100.0)

## 2021-08-18 LAB — HEMOGLOBIN A1C
Hgb A1c MFr Bld: 8.6 % — ABNORMAL HIGH (ref 4.8–5.6)
Mean Plasma Glucose: 200.12 mg/dL

## 2021-08-18 LAB — SARS CORONAVIRUS 2 (TAT 6-24 HRS): SARS Coronavirus 2: NEGATIVE

## 2021-08-18 LAB — MAGNESIUM: Magnesium: 2.4 mg/dL (ref 1.7–2.4)

## 2021-08-18 MED ORDER — SODIUM CHLORIDE 0.9% FLUSH
3.0000 mL | Freq: Two times a day (BID) | INTRAVENOUS | Status: DC
  Administered 2021-08-19 (×2): 3 mL via INTRAVENOUS

## 2021-08-18 MED ORDER — ACETAMINOPHEN 325 MG PO TABS: 650.0000 mg | ORAL_TABLET | ORAL | Status: DC | PRN

## 2021-08-18 MED ORDER — ASPIRIN 81 MG PO CHEW
324.0000 mg | CHEWABLE_TABLET | ORAL | Status: AC
  Administered 2021-08-18: 324 mg via ORAL
  Filled 2021-08-18: qty 4

## 2021-08-18 MED ORDER — ALPRAZOLAM 0.25 MG PO TABS: 0.2500 mg | ORAL_TABLET | Freq: Two times a day (BID) | ORAL | Status: DC | PRN

## 2021-08-18 MED ORDER — NITROGLYCERIN 0.4 MG SL SUBL: 0.4000 mg | SUBLINGUAL_TABLET | SUBLINGUAL | Status: DC | PRN

## 2021-08-18 MED ORDER — FLUOXETINE HCL 10 MG PO CAPS
10.0000 mg | ORAL_CAPSULE | Freq: Every day | ORAL | Status: DC
  Administered 2021-08-19 – 2021-08-20 (×2): 10 mg via ORAL
  Filled 2021-08-18 (×2): qty 1

## 2021-08-18 MED ORDER — ASPIRIN EC 81 MG PO TBEC
81.0000 mg | DELAYED_RELEASE_TABLET | Freq: Every day | ORAL | Status: DC
  Administered 2021-08-19 – 2021-08-20 (×2): 81 mg via ORAL
  Filled 2021-08-18 (×2): qty 1

## 2021-08-18 MED ORDER — DOCUSATE SODIUM 100 MG PO CAPS: 100.0000 mg | ORAL_CAPSULE | Freq: Two times a day (BID) | ORAL | Status: DC | PRN

## 2021-08-18 MED ORDER — DIPHENHYDRAMINE HCL 25 MG PO CAPS
25.0000 mg | ORAL_CAPSULE | Freq: Every evening | ORAL | Status: DC | PRN
  Administered 2021-08-18 – 2021-08-19 (×2): 25 mg via ORAL
  Filled 2021-08-18 (×2): qty 1

## 2021-08-18 MED ORDER — EMPAGLIFLOZIN 10 MG PO TABS
10.0000 mg | ORAL_TABLET | Freq: Every day | ORAL | Status: DC
  Administered 2021-08-19 – 2021-08-20 (×2): 10 mg via ORAL
  Filled 2021-08-18 (×3): qty 1

## 2021-08-18 MED ORDER — AMLODIPINE BESYLATE 5 MG PO TABS
5.0000 mg | ORAL_TABLET | Freq: Every day | ORAL | Status: DC
  Administered 2021-08-19 – 2021-08-20 (×2): 5 mg via ORAL
  Filled 2021-08-18 (×2): qty 1

## 2021-08-18 MED ORDER — PANTOPRAZOLE SODIUM 40 MG PO TBEC
40.0000 mg | DELAYED_RELEASE_TABLET | Freq: Every day | ORAL | Status: DC
  Administered 2021-08-19 – 2021-08-20 (×2): 40 mg via ORAL
  Filled 2021-08-18 (×2): qty 1

## 2021-08-18 MED ORDER — SPIRONOLACTONE 25 MG PO TABS
25.0000 mg | ORAL_TABLET | Freq: Every day | ORAL | Status: DC
  Administered 2021-08-19 – 2021-08-20 (×2): 25 mg via ORAL
  Filled 2021-08-18 (×2): qty 1

## 2021-08-18 MED ORDER — ONDANSETRON HCL 4 MG/2ML IJ SOLN: 4.0000 mg | Freq: Four times a day (QID) | INTRAMUSCULAR | Status: DC | PRN

## 2021-08-18 MED ORDER — INSULIN ASPART 100 UNIT/ML IJ SOLN
0.0000 [IU] | Freq: Three times a day (TID) | INTRAMUSCULAR | Status: DC
Start: 2021-08-19 — End: 2021-08-20
  Administered 2021-08-19: 2 [IU] via SUBCUTANEOUS

## 2021-08-18 MED ORDER — CARVEDILOL 6.25 MG PO TABS
6.2500 mg | ORAL_TABLET | Freq: Two times a day (BID) | ORAL | Status: DC
  Administered 2021-08-19 – 2021-08-20 (×3): 6.25 mg via ORAL
  Filled 2021-08-18 (×2): qty 1
  Filled 2021-08-18: qty 2

## 2021-08-18 MED ORDER — SODIUM CHLORIDE 0.9 % WEIGHT BASED INFUSION
1.0000 mL/kg/h | INTRAVENOUS | Status: DC
  Administered 2021-08-18: 1 mL/kg/h via INTRAVENOUS

## 2021-08-18 MED ORDER — ASPIRIN 81 MG PO CHEW
81.0000 mg | CHEWABLE_TABLET | ORAL | Status: AC
  Administered 2021-08-19: 81 mg via ORAL
  Filled 2021-08-18: qty 1

## 2021-08-18 MED ORDER — INSULIN ASPART 100 UNIT/ML IJ SOLN
0.0000 [IU] | Freq: Every day | INTRAMUSCULAR | Status: DC
Start: 2021-08-18 — End: 2021-08-20
  Administered 2021-08-18: 2 [IU] via SUBCUTANEOUS

## 2021-08-18 MED ORDER — MOMETASONE FURO-FORMOTEROL FUM 200-5 MCG/ACT IN AERO
2.0000 | INHALATION_SPRAY | Freq: Two times a day (BID) | RESPIRATORY_TRACT | Status: DC
  Administered 2021-08-19 (×2): 2 via RESPIRATORY_TRACT
  Filled 2021-08-18 (×2): qty 8.8

## 2021-08-18 MED ORDER — SODIUM CHLORIDE 0.9 % IV SOLN: INTRAVENOUS | Status: DC

## 2021-08-18 MED ORDER — VITAMIN B-12 100 MCG PO TABS
250.0000 ug | ORAL_TABLET | Freq: Every day | ORAL | Status: DC
  Administered 2021-08-19 – 2021-08-20 (×2): 250 ug via ORAL
  Filled 2021-08-18: qty 3
  Filled 2021-08-18: qty 1

## 2021-08-18 MED ORDER — HEPARIN (PORCINE) 25000 UT/250ML-% IV SOLN
850.0000 [IU]/h | INTRAVENOUS | Status: DC
  Administered 2021-08-18: 850 [IU]/h via INTRAVENOUS
  Filled 2021-08-18: qty 250

## 2021-08-18 MED ORDER — ALBUTEROL SULFATE (2.5 MG/3ML) 0.083% IN NEBU: 3.0000 mL | INHALATION_SOLUTION | Freq: Four times a day (QID) | RESPIRATORY_TRACT | Status: DC | PRN

## 2021-08-18 MED ORDER — ROSUVASTATIN CALCIUM 20 MG PO TABS
20.0000 mg | ORAL_TABLET | Freq: Every day | ORAL | Status: DC
  Administered 2021-08-19 – 2021-08-20 (×2): 20 mg via ORAL
  Filled 2021-08-18 (×2): qty 1

## 2021-08-18 MED ORDER — ASPIRIN 300 MG RE SUPP: 300.0000 mg | RECTAL | Status: AC

## 2021-08-18 NOTE — Telephone Encounter (Signed)
Pt c/o of Chest Pain: STAT if CP now or developed within 24 hours  1. Are you having CP right now? No  2. Are you experiencing any other symptoms (ex. SOB, nausea, vomiting, sweating)? SOB, feels like he needs to vomit, lightheadedness when getting up  3. How long have you been experiencing CP? 1 week  4. Is your CP continuous or coming and going? Coming and going  5. Have you taken Nitroglycerin? Yes  ?

## 2021-08-18 NOTE — Progress Notes (Signed)
ANTICOAGULATION CONSULT NOTE - Follow Up Consult  Pharmacy Consult for IV Heparin Indication: chest pain/ACS  Allergies  Allergen Reactions   Codeine Other (See Comments)    Makes him feel goofy   Morphine Other (See Comments)    Nightmares and felt bad    Patient Measurements: Height: 5\' 9"  (175.3 cm) Weight: 70.3 kg (155 lb) IBW/kg (Calculated) : 70.7 Heparin Dosing Weight: 70.3  Vital Signs: Temp: 98.4 F (36.9 C) (11/01 1600) Temp Source: Oral (11/01 1600) BP: 157/74 (11/01 1800) Pulse Rate: 77 (11/01 1800)  Labs: Recent Labs    08/18/21 1358 08/18/21 1558  HGB 14.1  --   HCT 44.1  --   PLT 147*  --   CREATININE 2.06*  --   TROPONINIHS 78* 90*    Estimated Creatinine Clearance: 25.6 mL/min (A) (by C-G formula based on SCr of 2.06 mg/dL (H)).   Assessment: 87 yo M presents with chest pain x past week relieved by nitroglycerin. Has a history significant for NSTEMI and CABG 2009. Last stent 09/2020. PTA eliquis 2.5mg  BID with LKD 11/1 @ 0600. Pharmacy consulted to start IV heparin.   Goal of Therapy:  Heparin level 0.3-0.7 units/ml aPTT 66-102 seconds Monitor platelets by anticoagulation protocol: Yes   Plan:  Start heparin IV 850 units/hr (no bolus)  8 hour aPTT and heparin level Daily aPTT, heparin level, and CBC  Will monitor aPTT and heparin levels with known recent dose of DOAC until labs correlate    Thank you for allowing pharmacy to participate in this patient's care.  Levonne Spiller, PharmD PGY1 Acute Care Resident  08/18/2021,6:48 PM

## 2021-08-18 NOTE — ED Provider Notes (Signed)
[facility] EMERGENCY DEPARTMENT Provider Note   CSN: [id number] Arrival date & time: 08/18/21  1256     History Chief Complaint  Patient presents with   Chest Pain    [name] is a 87 y.o. male.arthritis, COPD, LBBB, CAD s/p 4V CABG in 2009 by PVT and multiple subsequent PCIs, CKD stage 3-4, diabetes mellitus, GERD, HTN, HLD, carotid artery disease, chronic diastolic CHF   Presents to ER with concern for chest pain.  Patient has been having intermittent chest pain episodes over the past week.  Pain described as pressure, up to 7 or 8 in severity.  Pain improved with nitroglycerin.  Has had associated dyspnea with exertion.  Also feeling generally fatigued.  Also concerned about weight loss, lost 10 to 15 pounds over the past couple weeks.  Currently pain-free.  HPI     Past Medical History:  Diagnosis Date   Aortic stenosis, moderate 09/20/2020   Arthritis    CAD (coronary artery disease)    a. Cath September 2015 LIMA to the LAD patent, SVG to PDA patent, SVG to posterior lateral patent, SVG to OM with a 90% in-stent restenosis at an anastomotic lesion. This was treated with angioplasty. b. cath 04/01/2015 95% ISR in SVG to OM treated with 2.75x24 Synergy DES postdilated to 3.71mm, all other grafts patent   Cataract    surgery,B/L   CHF (congestive heart failure) (HCC)    Chronic kidney disease    nephrolithiasis   Diabetes mellitus    TYPE 2   GERD (gastroesophageal reflux disease)    Hernia    Hypercholesterolemia    Hypertension    Incontinence    hx  over 1 year,leaks without awareness   Other and unspecified diseases of appendix    PAF (paroxysmal atrial fibrillation) ([place])    in the setting of ischemia on 03/31/2015   Pancreatitis    Prostate CA ([place]) 03/01/04   prostate bx=Adenocarcinoam,gleason 3+4=7,PSA=6.75   Shortness of breath dyspnea    Ulcer    hx gastric    Patient Active Problem List   Diagnosis Date Noted   S/P TAVR  (transcatheter aortic valve replacement) 08/18/2021   Unstable angina ([place]) 08/18/2021   Severe aortic stenosis 04/29/2021   Aortic stenosis, severe 04/27/2021   Angina at rest [facility]) 09/20/2020   Chronic diastolic (congestive) heart failure ([place]) 09/20/2020   COPD with acute bronchitis ([place]) 09/08/2020   Acute exacerbation of CHF (congestive heart failure) ([place]) 09/07/2020   COPD mixed type ([place]) 48/88/9169   Chronic systolic HF (heart failure) ([place]) 45/12/8880   Systolic dysfunction with acute on chronic heart failure ([place]) 10/23/2019   Educated about COVID-19 virus infection 10/23/2019   Persistent atrial fibrillation ([place]) 08/29/2019   Nonrheumatic aortic valve stenosis 08/29/2019   Elevated troponin 07/27/2019   HLD (hyperlipidemia) 07/27/2019   GERD (gastroesophageal reflux disease) 07/27/2019   Congestive heart failure (CHF) ([place]) 07/26/2019   Pure hypercholesterolemia    Acute on chronic diastolic CHF (congestive heart failure) (HCC)    SOB (shortness of breath) 06/04/2019   CKD (chronic kidney disease), stage III ([place]) 06/04/2019   Coronary artery disease of native artery of native heart with stable angina pectoris ([place]) 03/24/2018   Bilateral carotid artery stenosis 07/23/2017   Palpitation 07/23/2017   Urinary urgency 07/28/2016   PAF (paroxysmal atrial fibrillation) (HCC)    DOE (dyspnea on exertion) 07/17/2014   Acute on chronic renal insufficiency- ACE and diuretics held 80/12/4915   Diastolic dysfunction-  moderate on echo 2013 07/17/2014   Aortic stenosis 07/17/2014   LBBB (left bundle branch block) 07/17/2014   Chest pain 07/17/2014   Crescendo angina ([place]) 07/16/2014   CKD (chronic kidney disease) stage 4, GFR 15-29 ml/min (HCC) 12/03/2013   Squamous cell cancer of skin of hand 05/31/2013   HEAD TRAUMA, CLOSED 09/24/2010   Ischemic cardiomyopathy- last EF Normal Myoview Jan 2015 04/02/2010   INSOMNIA 04/02/2010   Moderate bilateral ICA disease by doppler June  2015 01/08/2010   DYSPHAGIA UNSPECIFIED 09/05/2009   Hx GERD/PUD 09/03/2009   CHOLELITHIASIS, WITH CHOLECYSTITIS 09/03/2009   ARTHRITIS 07/16/2009   HYPOKALEMIA 11/06/2008   CAD in native artery 11/06/2008   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 ([place]) 11/23/2007   Dyslipidemia 11/23/2007   ROSACEA 11/23/2007   DYSPNEA ON EXERTION 11/23/2007   UNS ADVRS EFF OTH RX MEDICINAL&BIOLOGICAL SBSTNC 11/23/2007   HTN (hypertension) 11/22/2007   Hx of Prostate ca 03/01/2004    Past Surgical History:  Procedure Laterality Date   APPENDECTOMY     BACK SURGERY     CARDIAC CATHETERIZATION N/A 04/01/2015   Procedure: Left Heart Cath and Cors/Grafts Angiography;  Surgeon: Jettie Booze, MD;  Location: [facility] CV LAB;  Service: Cardiovascular;  Laterality: N/A;   CARDIAC CATHETERIZATION N/A 04/01/2015   Procedure: Coronary Stent Intervention;  Surgeon: Jettie Booze, MD;  Location: [facility] CV LAB;  Service: Cardiovascular;  Laterality: N/A;   CARDIOVERSION N/A 11/01/2019   Procedure: CARDIOVERSION;  Surgeon: Geralynn Rile, MD;  Location: [facility] ENDOSCOPY;  Service: Cardiovascular;  Laterality: N/A;   CARPAL TUNNEL RELEASE  2004   both hands   CORONARY ARTERY BYPASS GRAFT     CORONARY STENT INTERVENTION N/A 09/23/2020   Procedure: CORONARY STENT INTERVENTION;  Surgeon: Sherren Mocha, MD;  Location: [facility] LAB;  Service: Cardiovascular;  Laterality: N/A;   CYSTOSCOPY  08/23/12   incomplete emoptying bladder   HERNIA REPAIR     incision and drainage of right chest abscess     INTRAOPERATIVE TRANSTHORACIC ECHOCARDIOGRAM Left 04/28/2021   Procedure: INTRAOPERATIVE TRANSTHORACIC ECHOCARDIOGRAM;  Surgeon: Burnell Blanks, MD;  Location: [facility];  Service: Open Heart Surgery;  Laterality: Left;   LAPAROSCOPIC CHOLECYSTECTOMY  09/2009   LEFT HEART CATHETERIZATION WITH CORONARY ANGIOGRAM N/A 07/19/2014   Procedure: LEFT HEART CATHETERIZATION WITH  CORONARY ANGIOGRAM;  Surgeon: Leonie Man, MD;  Location: [facility] CATH LAB;  Service: Cardiovascular;  Laterality: N/A;   RECTAL SURGERY     RIGHT/LEFT HEART CATH AND CORONARY/GRAFT ANGIOGRAPHY N/A 09/23/2020   Procedure: RIGHT/LEFT HEART CATH AND CORONARY/GRAFT ANGIOGRAPHY;  Surgeon: Sherren Mocha, MD;  Location: [facility] CV LAB;  Service: Cardiovascular;  Laterality: N/A;   ROBOT ASSISTED LAPAROSCOPIC RADICAL PROSTATECTOMY  2005   TRANSCATHETER AORTIC VALVE REPLACEMENT, TRANSFEMORAL Bilateral 04/28/2021   Procedure: TRANSCATHETER AORTIC VALVE REPLACEMENT, TRANSFEMORAL;  Surgeon: Burnell Blanks, MD;  Location: [facility];  Service: Open Heart Surgery;  Laterality: Bilateral;   ULTRASOUND GUIDANCE FOR VASCULAR ACCESS Bilateral 04/28/2021   Procedure: ULTRASOUND GUIDANCE FOR VASCULAR ACCESS;  Surgeon: Burnell Blanks, MD;  Location: [facility];  Service: Open Heart Surgery;  Laterality: Bilateral;       Family History  Problem Relation Age of Onset   Cancer Mother        stomach   Heart attack Father     Social History   Tobacco Use   Smoking status: Former    Packs/day: 0.50    Years: 5.00  Pack years: 2.50    Types: Cigarettes    Quit date: 07/20/1972    Years since quitting: 49.1   Smokeless tobacco: Never   Tobacco comments:    quit s  Vaping Use   Vaping Use: Never used  Substance Use Topics   Alcohol use: No   Drug use: No    Comment: quit smoking 40 years ago    Home Medications Prior to Admission medications   Medication Sig Start Date End Date Taking? Authorizing Provider  albuterol (VENTOLIN HFA) 108 (90 Base) MCG/ACT inhaler Inhale 2 puffs into the lungs every 6 (six) hours as needed. Patient taking differently: Inhale 2 puffs into the lungs every 6 (six) hours as needed for wheezing or shortness of breath. 08/12/20  Yes Spero Geralds, MD  amLODipine (NORVASC) 5 MG tablet TAKE 1 TABLET BY MOUTH EVERY DAY 08/17/21  Yes Burchette, Alinda Sierras, MD   apixaban (ELIQUIS) 2.5 MG TABS tablet Take 1 tablet (2.5 mg total) by mouth 2 (two) times daily. 04/29/21  Yes Kathyrn Drown D, NP  budesonide-formoterol [facility]) 160-4.5 MCG/ACT inhaler Inhale 2 puffs into the lungs in the morning and at bedtime. Patient taking differently: Inhale 2 puffs into the lungs 2 (two) times daily as needed (sob/wheezing). 08/07/20  Yes Spero Geralds, MD  Carboxymethylcellul-Glycerin (LUBRICATING EYE DROPS OP) Place 1 drop into both eyes daily as needed (dry eyes).   Yes [provider]  carvedilol (COREG) 6.25 MG tablet TAKE 1 TABLET (6.25 MG TOTAL) BY MOUTH 2 (TWO) TIMES DAILY WITH A MEAL. 06/04/21  Yes [name], MD  Cyanocobalamin (VITAMIN B-12 PO) Take 1 tablet by mouth daily.   Yes [provider]  docusate sodium (STOOL SOFTENER) 100 MG capsule Take 1 capsule (100 mg total) by mouth 2 (two) times daily as needed for mild constipation. 07/28/19  Yes Ivor Costa, MD  empagliflozin (JARDIANCE) 10 MG TABS tablet Take 1 tablet (10 mg total) by mouth daily before breakfast. 06/12/21  Yes Eileen Stanford, PA-C  FLUoxetine (PROZAC) 10 MG capsule TAKE 1 CAPSULE BY MOUTH EVERY DAY 06/04/21  Yes Burchette, Alinda Sierras, MD  furosemide (LASIX) 40 MG tablet Take 1 tablet (40 mg total) by mouth 2 (two) times daily. 07/12/21  Yes Eileen Stanford, PA-C  Homeopathic Products [facility] RELIEF EX) Apply 1 application topically daily as needed (knee pain).   Yes [provider]  hydrocortisone 2.5 % cream Apply 1 application topically daily as needed (facial breakouts).   Yes [provider]  JANUVIA 50 MG tablet TAKE 1 TABLET BY MOUTH EVERY DAY 08/17/21  Yes Burchette, Alinda Sierras, MD  Multiple Vitamins-Minerals (ICAPS AREDS 2 PO) Take 1 capsule by mouth daily.   Yes [provider]  nitroGLYCERIN (NITROSTAT) 0.4 MG SL tablet PLACE 1 TABLET UNDER THE TONGUE EVERY 5 MINUTES X 3 DOSES AS NEEDED FOR CHEST PAIN 07/24/21  Yes [name], MD  pantoprazole (PROTONIX) 40 MG tablet TAKE 1 TABLET BY MOUTH EVERY DAY 06/04/21  Yes Burchette, Alinda Sierras, MD  rosuvastatin (CRESTOR) 20 MG tablet TAKE 1 TABLET BY MOUTH EVERY DAY 08/17/21  Yes Burchette, Alinda Sierras, MD  spironolactone (ALDACTONE) 25 MG tablet Take 1 tablet (25 mg total) by mouth daily. 03/13/21  Yes [name], MD  blood glucose meter kit and supplies KIT Dispense based on patient and insurance preference. Use up to four times daily as directed. (FOR ICD-9 250.00, 250.01). 09/08/20   [place] Shi, MD    Allergies  Codeine and Morphine  Review of Systems   Review of Systems  Constitutional:  Negative for chills and fever.  HENT:  Negative for ear pain and sore throat.   Eyes:  Negative for pain and visual disturbance.  Respiratory:  Negative for cough and shortness of breath.   Cardiovascular:  Positive for chest pain. Negative for palpitations.  Gastrointestinal:  Negative for abdominal pain and vomiting.  Genitourinary:  Negative for dysuria and hematuria.  Musculoskeletal:  Negative for arthralgias and back pain.  Skin:  Negative for color change and rash.  Neurological:  Negative for seizures and syncope.  All other systems reviewed and are negative.  Physical Exam Updated Vital Signs BP 101/74   Pulse 64   Temp 98.4 F (36.9 C) (Oral)   Resp 18   Ht $R'5\' 9"'gh$  (1.753 m)   Wt 70.3 kg   SpO2 98%   BMI 22.89 kg/m   Physical Exam Vitals and nursing note reviewed.  Constitutional:      Appearance: He is well-developed.  HENT:     Head: Normocephalic and atraumatic.  Eyes:     Conjunctiva/sclera: Conjunctivae normal.  Cardiovascular:     Rate and Rhythm: Normal rate and regular rhythm.     Heart sounds: No murmur heard. Pulmonary:     Effort: Pulmonary effort is normal. No respiratory distress.     Breath sounds: Normal breath sounds.  Abdominal:     Palpations: Abdomen is soft.     Tenderness: There is no abdominal tenderness.   Musculoskeletal:     Cervical back: Neck supple.  Skin:    General: Skin is warm and dry.  Neurological:     General: No focal deficit present.     Mental Status: He is alert.  Psychiatric:        Mood and Affect: Mood normal.    ED Results / Procedures / Treatments   Labs (all labs ordered are listed, but only abnormal results are displayed) Labs Reviewed  BASIC METABOLIC PANEL - Abnormal; Notable for the following components:      Result Value   Sodium 129 (*)    Chloride 95 (*)    Glucose, Bld 260 (*)    BUN 37 (*)    Creatinine, Ser 2.06 (*)    GFR, Estimated 31 (*)    All other components within normal limits  CBC - Abnormal; Notable for the following components:   WBC 14.8 (*)    MCV 79.9 (*)    MCH 25.5 (*)    RDW 15.8 (*)    Platelets 147 (*)    All other components within normal limits  BRAIN NATRIURETIC PEPTIDE - Abnormal; Notable for the following components:   B Natriuretic Peptide 590.2 (*)    All other components within normal limits  TROPONIN I (HIGH SENSITIVITY) - Abnormal; Notable for the following components:   Troponin I (High Sensitivity) 78 (*)    All other components within normal limits  TROPONIN I (HIGH SENSITIVITY) - Abnormal; Notable for the following components:   Troponin I (High Sensitivity) 90 (*)    All other components within normal limits  SARS CORONAVIRUS 2 (TAT 6-24 HRS)  URINALYSIS, ROUTINE W REFLEX MICROSCOPIC  HEPARIN LEVEL (UNFRACTIONATED)  APTT  CBC  HEMOGLOBIN A1C    EKG None  Radiology DG Chest 2 View  Result Date: 08/18/2021 CLINICAL DATA:  Chest pain EXAM: CHEST - 2 VIEW COMPARISON:  CT chest 03/26/2021, chest x-ray 05/27/2021, chest x-ray 07/26/2019 FINDINGS:  The heart and mediastinal contours are unchanged. Aortic valve replacement. Aortic calcification. Coronary artery calcification and possible stent. Cardiac surgical changes overlie the mediastinum. Redemonstration of an approximately 2 cm left lower lobe  nodular-like density. No focal consolidation. No pulmonary edema. No pleural effusion. No pneumothorax. No acute osseous abnormality. IMPRESSION: Redemonstration of an approximately 2 cm left lower lobe nodular-like density. Recommend CT chest with intravenous contrast for further evaluation. Electronically Signed   By: Iven Finn M.D.   On: 08/18/2021 15:43    Procedures .Critical Care Performed by: Lucrezia Starch, MD Authorized by: Lucrezia Starch, MD   Critical care provider statement:    Critical care time (minutes):  38   Critical care time was exclusive of:  Separately billable procedures and treating other patients   Critical care was necessary to treat or prevent imminent or life-threatening deterioration of the following conditions:  Cardiac failure   Critical care was time spent personally by me on the following activities:  Discussions with consultants, discussions with primary provider, evaluation of patient's response to treatment, examination of patient, ordering and review of laboratory studies, ordering and review of radiographic studies, review of old charts, obtaining history from patient or surrogate and development of treatment plan with patient or surrogate   I assumed direction of critical care for this patient from another provider in my specialty: no     Care discussed with: admitting provider     Medications Ordered in ED Medications  heparin ADULT infusion 100 units/mL (25000 units/228mL) (850 Units/hr Intravenous New Bag/Given 08/18/21 1932)  sodium chloride flush (NS) 0.9 % injection 3 mL (has no administration in time range)  0.9% sodium chloride infusion (1 mL/kg/hr  70.3 kg Intravenous New Bag/Given 08/18/21 1935)  insulin aspart (novoLOG) injection 0-9 Units (has no administration in time range)  insulin aspart (novoLOG) injection 0-5 Units (has no administration in time range)  diphenhydrAMINE (BENADRYL) capsule 25 mg (has no administration in time  range)    ED Course  I have reviewed the triage vital signs and the nursing notes.  Pertinent labs & imaging results that were available during my care of the patient were reviewed by me and considered in my medical decision making (see chart for details).    MDM Rules/Calculators/A&P                           87 year old gentleman with quite extensive history of coronary artery disease presents to ER with concern for onset of new chest pain episodes.  History highly suspicious for worsening of his coronary artery disease.  His EKG does not have any clear acute ischemic change but his troponin is mildly elevated. Overal picture concerning for ACS/NSTEMI.  I consulted cardiology who came to bedside and recommended admission.  We will start on heparin, likely cardiac cath tomorrow.  Final Clinical Impression(s) / ED Diagnoses Final diagnoses:  NSTEMI (non-ST elevated myocardial infarction) [facility])    Rx / DC Orders ED Discharge Orders     None        Lucrezia Starch, MD 08/18/21 2027

## 2021-08-18 NOTE — ED Notes (Signed)
MSEd   Myna Bright [place], [place] 08/18/21 1621

## 2021-08-18 NOTE — Progress Notes (Deleted)
Cardiology Office Note   Date:  08/18/2021   ID:  [name], DOB [birth year], MRN [id number]  PCP:  Shawn Post, MD  Cardiologist:   [name], MD  No chief complaint on file.     History of Present Illness: [name] is a 87 y.o. male who presents for followup of CAD.   He had chest pain and had a cardiac catheterization on 04/01/2015 which showed a 95% in-stent restenosis in SVG to OM, treated with 2.75 x 24 mm Synergy DES postdilated to 3.3 mm. He had patent LIMA to LAD, patent SVG to posterolateral, patent SVG to PDA.    Of note he did have atrial fibrillation with rate control on presentation but he converted spontaneously. He was not started on warfarin because he was taking aspirin and Plavix.   He was having SOB in Dec 2018.  On [place] he had a fixed inferior defect.  CPX testing suggested a mixed restrictive/obstructive pattern of PFTs.  He was not thought to have a cardiovascular limitation.   He has been in atrial fib.  I had him wear a Holter and he had 100% atrial fib on this monitor.  He had a cardioversion but had recurrent atrial fib.  He had continued dyspnea.  I sent him to pulmonary and he was found to have moderate COPD.   The Structural Heart Team was consulted and recommended work-up for possible TAVR. Patient underwent right/left heart cath on 09/23/2020 which showed severe native vessel CAD with patent LIMA to LAD, SVG to left PLA, and SVG to right PDA grafts. He had severe in-stent restenosis of SVG to OM graft which was treated with noncompliant balloon angioplasty. Moderate AS was noted with mean gradient of 14 mmHg but a component of paradoxical low flow low gradient AS was suspected. Triple therapy with Aspirin, Plavix, and Eliquis was recommended for 1 month at which time Aspirin can be discontinued. Plavix recommended for 6 months. Lasix was reduced to 80m once daily and Spironolactone was continued as well as Coreg and Amlodipine.  He has  continued to have chronic chest pain.  He had a TAVR.   Unfortunately, he has continued to have chest pain.  He was in the ED yesterday.   ***       However, he was back in atrial fibrillation .   that woke him from sleep with associated shortness of breath as well as orthopnea/PND and fever. High-sensitivity troponin was again mildly elevated. He was started on IV Lasix but this ultimately had to be stopped due to worsening renal function. Initially, there was concern that his CHF exacerbation may be secondary to atrial fibrillation. Therefore, he was started on Amiodarone with plans for possible TEE/DCCV. It was then felt that his aortic stenosis was his biggest cardiovascular problem. Amiodarone was discontinued and    Unfortunately he continued to have chest pain at the follow up visit in mid December.   Since I last saw him he has done relatively okay.  He has not yet started his garden.  Is not been doing any more.  And is trying to avoid doing things like bending over.  However, he plans to do tingling in his yard.  He has had some occasional chest pain but cannot distinguish GI discomfort.  He has not taken any nitroglycerin since we saw him.  He does not think any of his different that he had before his PCI.  He thinks his  breathing might be slightly worse.  However, he is not having any resting shortness of breath, PND or orthopnea.  Has had no new palpitations, presyncope or syncope.  He has had no weight gain or edema.  Past Medical History:  Diagnosis Date   Aortic stenosis, moderate 09/20/2020   Arthritis    CAD (coronary artery disease)    a. Cath September 2015 LIMA to the LAD patent, SVG to PDA patent, SVG to posterior lateral patent, SVG to OM with a 90% in-stent restenosis at an anastomotic lesion. This was treated with angioplasty. b. cath 04/01/2015 95% ISR in SVG to OM treated with 2.75x24 Synergy DES postdilated to 3.12m, all other grafts patent   Cataract    surgery,B/L   CHF  (congestive heart failure) (HCC)    Chronic kidney disease    nephrolithiasis   Diabetes mellitus    TYPE 2   GERD (gastroesophageal reflux disease)    Hernia    Hypercholesterolemia    Hypertension    Incontinence    hx  over 1 year,leaks without awareness   Other and unspecified diseases of appendix    [place]    in the setting of ischemia on 03/31/2015   Pancreatitis    [place]   prostate bx=Adenocarcinoam,gleason 3+4=7,PSA=6.75   Shortness of breath dyspnea    Ulcer    hx gastric    Past Surgical History:  Procedure Laterality Date   APPENDECTOMY     BACK SURGERY     CARDIAC CATHETERIZATION N/A 04/01/2015   Procedure: Left Heart Cath and Cors/Grafts Angiography;  Surgeon: JJettie Booze MD;  [facility];  Service: Cardiovascular;  Laterality: N/A;   CARDIAC CATHETERIZATION N/A 04/01/2015   Procedure: Coronary Stent Intervention;  Surgeon: JJettie Booze MD;  [facility];  Service: Cardiovascular;  Laterality: N/A;   CARDIOVERSION N/A 11/01/2019   Procedure: CARDIOVERSION;  Surgeon: OGeralynn Rile MD;  [facility];  Service: Cardiovascular;  Laterality: N/A;   CARPAL TUNNEL RELEASE  2004   both hands   CORONARY ARTERY BYPASS GRAFT     CORONARY STENT INTERVENTION N/A 09/23/2020   Procedure: CORONARY STENT INTERVENTION;  Surgeon: CSherren Mocha MD;  [facility];  Service: Cardiovascular;  Laterality: N/A;   CYSTOSCOPY  08/23/12   incomplete emoptying bladder   HERNIA REPAIR     incision and drainage of right chest abscess     INTRAOPERATIVE TRANSTHORACIC ECHOCARDIOGRAM Left 04/28/2021   Procedure: INTRAOPERATIVE TRANSTHORACIC ECHOCARDIOGRAM;  Surgeon: MBurnell Blanks MD;  [facility]  Service: Open Heart Surgery;  Laterality: Left;   LAPAROSCOPIC CHOLECYSTECTOMY  09/2009   LEFT HEART CATHETERIZATION WITH CORONARY ANGIOGRAM N/A  07/19/2014   Procedure: LEFT HEART CATHETERIZATION WITH CORONARY ANGIOGRAM;  Surgeon: DLeonie Man MD;  [facility];  Service: Cardiovascular;  Laterality: N/A;   RECTAL SURGERY     RIGHT/LEFT HEART CATH AND CORONARY/GRAFT ANGIOGRAPHY N/A 09/23/2020   Procedure: RIGHT/LEFT HEART CATH AND CORONARY/GRAFT ANGIOGRAPHY;  Surgeon: CSherren Mocha MD;  [facility];  Service: Cardiovascular;  Laterality: N/A;   ROBOT ASSISTED LAPAROSCOPIC RADICAL PROSTATECTOMY  2005   TRANSCATHETER AORTIC VALVE REPLACEMENT, TRANSFEMORAL Bilateral 04/28/2021   Procedure: TRANSCATHETER AORTIC VALVE REPLACEMENT, TRANSFEMORAL;  Surgeon: MBurnell Blanks MD;  [facility]  Service: Open Heart Surgery;  Laterality: Bilateral;   ULTRASOUND GUIDANCE FOR VASCULAR ACCESS Bilateral 04/28/2021   Procedure: ULTRASOUND GUIDANCE  FOR VASCULAR ACCESS;  Surgeon: Burnell Blanks, MD;  Location: [place];  Service: Open Heart Surgery;  Laterality: Bilateral;     Current Outpatient Medications  Medication Sig Dispense Refill   albuterol (VENTOLIN HFA) 108 (90 Base) MCG/ACT inhaler Inhale 2 puffs into the lungs every 6 (six) hours as needed. 18 g 5   amLODipine (NORVASC) 5 MG tablet TAKE 1 TABLET BY MOUTH EVERY DAY 90 tablet 3   apixaban (ELIQUIS) 2.5 MG TABS tablet Take 1 tablet (2.5 mg total) by mouth 2 (two) times daily. 180 tablet 1   blood glucose meter kit and supplies KIT Dispense based on patient and insurance preference. Use up to four times daily as directed. (FOR ICD-9 250.00, 250.01). 1 each 0   budesonide-formoterol (SYMBICORT) 160-4.5 MCG/ACT inhaler Inhale 2 puffs into the lungs in the morning and at bedtime. 1 each 12   Carboxymethylcellul-Glycerin (LUBRICATING EYE DROPS OP) Place 1 drop into both eyes daily as needed (dry eyes).     carvedilol (COREG) 6.25 MG tablet TAKE 1 TABLET (6.25 MG TOTAL) BY MOUTH 2 (TWO) TIMES DAILY WITH A MEAL. 180 tablet 3   docusate sodium (STOOL  SOFTENER) 100 MG capsule Take 1 capsule (100 mg total) by mouth 2 (two) times daily as needed for mild constipation. 30 capsule 0   empagliflozin (JARDIANCE) 10 MG TABS tablet Take 1 tablet (10 mg total) by mouth daily before breakfast. 30 tablet 3   FLUoxetine (PROZAC) 10 MG capsule TAKE 1 CAPSULE BY MOUTH EVERY DAY 90 capsule 2   furosemide (LASIX) 40 MG tablet Take 1 tablet (40 mg total) by mouth 2 (two) times daily. 120 tablet 6   Homeopathic Products (THERAWORX RELIEF EX) Apply 1 application topically daily as needed (knee pain).     hydrocortisone 2.5 % cream Apply 1 application topically daily as needed (facial breakouts).     JANUVIA 50 MG tablet TAKE 1 TABLET BY MOUTH EVERY DAY 90 tablet 1   Multiple Vitamins-Minerals (ICAPS AREDS 2 PO) Take 1 capsule by mouth daily.     nitroGLYCERIN (NITROSTAT) 0.4 MG SL tablet PLACE 1 TABLET UNDER THE TONGUE EVERY 5 MINUTES X 3 DOSES AS NEEDED FOR CHEST PAIN 75 tablet 1   pantoprazole (PROTONIX) 40 MG tablet TAKE 1 TABLET BY MOUTH EVERY DAY 90 tablet 1   rosuvastatin (CRESTOR) 20 MG tablet TAKE 1 TABLET BY MOUTH EVERY DAY 90 tablet 0   spironolactone (ALDACTONE) 25 MG tablet Take 1 tablet (25 mg total) by mouth daily. 90 tablet 3   No current facility-administered medications for this visit.    Allergies:   Codeine and Morphine    ROS:  Please see the history of present illness.   Otherwise, review of systems are positive for *** .   All other systems are reviewed and negative.    PHYSICAL EXAM: VS:  There were no vitals taken for this visit. , BMI There is no height or weight on file to calculate BMI. GENERAL:  Well appearing NECK:  No jugular venous distention, waveform within normal limits, carotid upstroke brisk and symmetric, no bruits, no thyromegaly LUNGS:  Clear to auscultation bilaterally CHEST:  Well healed sternotomy scar. HEART:  PMI not displaced or sustained,S1 and S2 within normal limits, no S3, no S4, no clicks, no rubs, ***  murmurs ABD:  Flat, positive bowel sounds normal in frequency in pitch, no bruits, no rebound, no guarding, no midline pulsatile mass, no hepatomegaly, no splenomegaly EXT:  2 plus  pulses throughout, no edema, no cyanosis no clubbing     ***GENERAL:  Well appearing NECK:  No jugular venous distention, waveform within normal limits, carotid upstroke brisk and symmetric, no bruits, no thyromegaly LUNGS:  Clear to auscultation bilaterally CHEST:  Well healed sternotomy scar. HEART:  PMI not displaced or sustained,S1 and S2 within normal limits, no S3, no clicks, no rubs, 2 out of 6 apical systolic murmur early mid peaking, no diastolic murmurs, iiregular ABD:  Flat, positive bowel sounds normal in frequency in pitch, no bruits, no rebound, no guarding, no midline pulsatile mass, no hepatomegaly, no splenomegaly EXT:  2 plus pulses throughout, no edema, no cyanosis no clubbing    EKG:  EKG is *** ordered today. ***  Recent Labs: 09/21/2020: TSH 2.809 04/27/2021: ALT 19 04/29/2021: Magnesium 1.8 05/13/2021: NT-Pro BNP 10,919 08/18/2021: B Natriuretic Peptide 590.2; BUN 37; Creatinine, Ser 2.06; Hemoglobin 14.1; Platelets 147; Potassium 3.7; Sodium 129    Lipid Panel    Component Value Date/Time   CHOL 97 (L) 01/01/2021 0848   TRIG 92 01/01/2021 0848   TRIG 306 (HH) 09/27/2006 1154   HDL 33 (L) 01/01/2021 0848   CHOLHDL 2.9 01/01/2021 0848   CHOLHDL 4 01/14/2020 0854   VLDL 26.4 01/14/2020 0854   LDLCALC 46 01/01/2021 0848   LDLDIRECT 143.3 11/23/2007 0000      Wt Readings from Last 3 Encounters:  08/18/21 155 lb (70.3 kg)  06/11/21 171 lb 9.6 oz (77.8 kg)  06/04/21 173 lb (78.5 kg)     CARDIAC CATH    Intervention       Other studies Reviewed: Additional studies/ records that were reviewed today include:  ED records Review of the above records demonstrates: See elsewhere  ASSESSMENT AND PLAN:  CAD s/p CABG with Recent PCI He continues to have chest pain.  ***   He has disease as above and has had ongoing pain.  He does have small vessel disease.  At this point he will continue with medication management.  He can stop his aspirin.  When he comes back to be seen in the [facility] clinic he can have his Plavix stopped as per suggestions on his cath late last year.   Status [name] TAVR ***   is going have a follow-up echo in May and follow-up in the [facility].  It is not clear that he has moderate gradient is a low flow gradient.  He does have multiple reasons to have dyspnea.  See below  Chronic Combined CHF ***  seems to be euvolemic at this point.  I am and have him come back for some blood work to include a basic metabolic profile.  Persistent Atrial Fibrillation He tolerates anticoagulation.  *** Discontinue the aspirin as above.   Carotid Artery Disease *** He is to have follow up Doppler in June.    Hypertension The blood pressure is ***at target. No change in medications is indicated. We will continue with therapeutic lifestyle changes (TLC).   Hyperlipidemia LDL was *** 56.  Continue current therapy.   Type 2 Diabetes Mellitus I will check an A1C.    CKD Stage III-IV The creatinine was *** mildly elevated at 1.95 in mid December. I will follow-up as above.  Current medicines are reviewed at length with the patient today.  The patient does not have concerns regarding medicines.  The following changes have been made:   ***  Labs/ tests ordered today include: ***  No orders of the  defined types were placed in this encounter.    Disposition:   FU with me in ***  Signed, [name], MD  08/18/2021 4:37 PM    [facility]

## 2021-08-18 NOTE — H&P (Signed)
Cardiology Admission History and Physical:   Patient ID: [name] MRN: [id number]; DOB: 1933-09-02   Admission date: 08/18/2021  PCP:  [name], MD   [facility] Providers Cardiologist:  [name], MD        Chief Complaint:  chest pain  Patient Profile:   [name] is a 87 y.o. 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  who is being seen 08/18/2021 for the evaluation of chest pain.  History of Present Illness:   [name] with above hx and CABG X 4, LIMA to LAD, VG to PDA, VG to 1st OM, SVG to PLA..  multiple PCIs since.  Last cath 09/23/20 with severe native 3 vessel disease,  patency of the LIMA to LAD, saphenous vein graft to left posterior lateral branch, and saphenous vein graft to right PDA, severe in-stent restenosis in the saphenous vein graft to obtuse marginal (2 layers of stent), treated successfully with noncompliant balloon angioplasty (3.5 mm noncompliant balloon to 22 atm) Moderate aortic stenosis with mean gradient 14 mmHg, calculated aortic valve area 1.37 cm, moderate amount of difficulty crossing the valve with a straight wire, suspect component of paradoxical low flow low gradient aortic stenosis.  He went on to have TAVR A Medtronic CoreValve Evolut ProPlus transcatheter heart valve (size29 mm, serial #[id number]) 04/28/21.   Last echo 06/04/21 with EF 40-45%, normally functioning TAVR, a mean gradient of 2 mmHg and trivial PVL, mild pulm HTN, mild LAE.    Now presents lt sided chest pain that has been intermittent does radiate to lt shoulder and neck at times.  NTG with relief. Has also had generalized weakness, dyspnea on exertion and 10 pound wt loss. No cold symptoms no diarrhea, no fever.  He has had some nausea.    EKG:  The ECG that was done today with atrial fib rate controlled LBBB LVH was personally  reviewed.   Hs troponin 78 and 90  BNP 590 Na 129, K+ 3.7 glucose 260 BUN 37, Cr 2.06  WBC 14.8, Ua ordered, Hgb 14 plts 147  BP 157/74 P 60s   Past Medical History:  Diagnosis Date   Aortic stenosis, moderate 09/20/2020   Arthritis    CAD (coronary artery disease)    a. Cath September 2015 LIMA to the LAD patent, SVG to PDA patent, SVG to posterior lateral patent, SVG to OM with a 90% in-stent restenosis at an anastomotic lesion. This was treated with angioplasty. b. cath 04/01/2015 95% ISR in SVG to OM treated with 2.75x24 Synergy DES postdilated to 3.69m, all other grafts patent   Cataract    surgery,B/L   CHF (congestive heart failure) (HCC)    Chronic kidney disease    nephrolithiasis   Diabetes mellitus    TYPE 2   GERD (gastroesophageal reflux disease)    Hernia    Hypercholesterolemia    Hypertension    Incontinence    hx  over 1 year,leaks without awareness   Other and unspecified diseases of appendix    PAF (paroxysmal atrial fibrillation) ([place]    in the setting of ischemia on 03/31/2015   Pancreatitis    Prostate CA ([place] 03/01/04   prostate bx=Adenocarcinoam,gleason 3+4=7,PSA=6.75   Shortness of breath dyspnea    Ulcer    hx gastric    Past Surgical History:  Procedure Laterality  Date   APPENDECTOMY     BACK SURGERY     CARDIAC CATHETERIZATION N/A 04/01/2015   Procedure: Left Heart Cath and Cors/Grafts Angiography;  Surgeon: Jettie Booze, MD;  Location: [place] [facility];  Service: Cardiovascular;  Laterality: N/A;   CARDIAC CATHETERIZATION N/A 04/01/2015   Procedure: Coronary Stent Intervention;  Surgeon: Jettie Booze, MD;  Location: [place] [facility];  Service: Cardiovascular;  Laterality: N/A;   CARDIOVERSION N/A 11/01/2019   Procedure: CARDIOVERSION;  Surgeon: Geralynn Rile, MD;  Location: [facility] ENDOSCOPY;  Service: Cardiovascular;  Laterality: N/A;   CARPAL TUNNEL RELEASE  2004   both hands   CORONARY ARTERY BYPASS GRAFT     CORONARY  STENT INTERVENTION N/A 09/23/2020   Procedure: CORONARY STENT INTERVENTION;  Surgeon: Sherren Mocha, MD;  Location: [place] [facility];  Service: Cardiovascular;  Laterality: N/A;   CYSTOSCOPY  08/23/12   incomplete emoptying bladder   HERNIA REPAIR     incision and drainage of right chest abscess     INTRAOPERATIVE TRANSTHORACIC ECHOCARDIOGRAM Left 04/28/2021   Procedure: INTRAOPERATIVE TRANSTHORACIC ECHOCARDIOGRAM;  Surgeon: Burnell Blanks, MD;  Location: [place];  Service: Open Heart Surgery;  Laterality: Left;   LAPAROSCOPIC CHOLECYSTECTOMY  09/2009   LEFT HEART CATHETERIZATION WITH CORONARY ANGIOGRAM N/A 07/19/2014   Procedure: LEFT HEART CATHETERIZATION WITH CORONARY ANGIOGRAM;  Surgeon: Leonie Man, MD;  Location[facility] CATH LAB;  Service: Cardiovascular;  Laterality: N/A;   RECTAL SURGERY     RIGHT/LEFT HEART CATH AND CORONARY/GRAFT ANGIOGRAPHY N/A 09/23/2020   Procedure: RIGHT/LEFT HEART CATH AND CORONARY/GRAFT ANGIOGRAPHY;  Surgeon: Sherren Mocha, MD;  Location: [place] [facility];  Service: Cardiovascular;  Laterality: N/A;   ROBOT ASSISTED LAPAROSCOPIC RADICAL PROSTATECTOMY  2005   TRANSCATHETER AORTIC VALVE REPLACEMENT, TRANSFEMORAL Bilateral 04/28/2021   Procedure: TRANSCATHETER AORTIC VALVE REPLACEMENT, TRANSFEMORAL;  Surgeon: Burnell Blanks, MD;  Location: [place];  Service: Open Heart Surgery;  Laterality: Bilateral;   ULTRASOUND GUIDANCE FOR VASCULAR ACCESS Bilateral 04/28/2021   Procedure: ULTRASOUND GUIDANCE FOR VASCULAR ACCESS;  Surgeon: Burnell Blanks, MD;  Location: [place];  Service: Open Heart Surgery;  Laterality: Bilateral;     Medications Prior to Admission: Prior to Admission medications   Medication Sig Start Date End Date Taking? Authorizing Provider  albuterol (VENTOLIN HFA) 108 (90 Base) MCG/ACT inhaler Inhale 2 puffs into the lungs every 6 (six) hours as needed. Patient taking differently: Inhale 2 puffs into the lungs every 6 (six) hours  as needed for wheezing or shortness of breath. 08/12/20  Yes Spero Geralds, MD  amLODipine (NORVASC) 5 MG tablet TAKE 1 TABLET BY MOUTH EVERY DAY 08/17/21  Yes Burchette, Alinda Sierras, MD  apixaban (ELIQUIS) 2.5 MG TABS tablet Take 1 tablet (2.5 mg total) by mouth 2 (two) times daily. 04/29/21  Yes Kathyrn Drown D, NP  budesonide-formoterol [facility]) 160-4.5 MCG/ACT inhaler Inhale 2 puffs into the lungs in the morning and at bedtime. Patient taking differently: Inhale 2 puffs into the lungs 2 (two) times daily as needed (sob/wheezing). 08/07/20  Yes Spero Geralds, MD  Carboxymethylcellul-Glycerin (LUBRICATING EYE DROPS OP) Place 1 drop into both eyes daily as needed (dry eyes).   Yes [provider]  carvedilol (COREG) 6.25 MG tablet TAKE 1 TABLET (6.25 MG TOTAL) BY MOUTH 2 (TWO) TIMES DAILY WITH A MEAL. 06/04/21  Yes [name], MD  Cyanocobalamin (VITAMIN B-12 PO) Take 1 tablet by mouth daily.   Yes [provider]  docusate  sodium (STOOL SOFTENER) 100 MG capsule Take 1 capsule (100 mg total) by mouth 2 (two) times daily as needed for mild constipation. 07/28/19  Yes Ivor Costa, MD  empagliflozin (JARDIANCE) 10 MG TABS tablet Take 1 tablet (10 mg total) by mouth daily before breakfast. 06/12/21  Yes Eileen Stanford, PA-C  FLUoxetine (PROZAC) 10 MG capsule TAKE 1 CAPSULE BY MOUTH EVERY DAY 06/04/21  Yes Burchette, Alinda Sierras, MD  furosemide (LASIX) 40 MG tablet Take 1 tablet (40 mg total) by mouth 2 (two) times daily. 07/12/21  Yes Eileen Stanford, PA-C  Homeopathic Products [facility] RELIEF EX) Apply 1 application topically daily as needed (knee pain).   Yes [provider]  hydrocortisone 2.5 % cream Apply 1 application topically daily as needed (facial breakouts).   Yes [provider]  JANUVIA 50 MG tablet TAKE 1 TABLET BY MOUTH EVERY DAY 08/17/21  Yes Burchette, Alinda Sierras, MD  Multiple Vitamins-Minerals (ICAPS AREDS 2 PO) Take 1 capsule by mouth daily.    Yes [provider]  nitroGLYCERIN (NITROSTAT) 0.4 MG SL tablet PLACE 1 TABLET UNDER THE TONGUE EVERY 5 MINUTES X 3 DOSES AS NEEDED FOR CHEST PAIN 07/24/21  Yes [name], MD  pantoprazole (PROTONIX) 40 MG tablet TAKE 1 TABLET BY MOUTH EVERY DAY 06/04/21  Yes Burchette, Alinda Sierras, MD  rosuvastatin (CRESTOR) 20 MG tablet TAKE 1 TABLET BY MOUTH EVERY DAY 08/17/21  Yes Burchette, Alinda Sierras, MD  spironolactone (ALDACTONE) 25 MG tablet Take 1 tablet (25 mg total) by mouth daily. 03/13/21  Yes [name], MD  blood glucose meter kit and supplies KIT Dispense based on patient and insurance preference. Use up to four times daily as directed. (FOR ICD-9 250.00, 250.01). 09/08/20   Guilford Shi, MD     Allergies:    Allergies  Allergen Reactions   Codeine Other (See Comments)    Makes him feel goofy   Morphine Other (See Comments)    Nightmares and felt bad    Social History:   Social History   Socioeconomic History   Marital status: Married    Spouse name: Not on file   Number of children: 2   Years of education: Not on file   Highest education level: Not on file  Occupational History    Employer: RETIRED    Comment: Retired  Tobacco Use   Smoking status: Former    Packs/day: 0.50    Years: 5.00    Pack years: 2.50    Types: Cigarettes    Quit date: 07/20/1972    Years since quitting: 49.1   Smokeless tobacco: Never   Tobacco comments:    quit s  Vaping Use   Vaping Use: Never used  Substance and Sexual Activity   Alcohol use: No   Drug use: No    Comment: quit smoking 40 years ago   Sexual activity: Never  Other Topics Concern   Not on file  Social History Narrative   Retired   Married      Social Determinants of Radio broadcast assistant Strain: Low Risk    Difficulty of Paying Living Expenses: Not hard at all  Food Insecurity: No Food Insecurity   Worried About Charity fundraiser in the Last Year: Never true   Arboriculturist in the Last Year:  Never true  Transportation Needs: No Transportation Needs   Lack of Transportation (Medical): No   Lack of Transportation (Non-Medical): No  Physical Activity: Insufficiently  Active   Days of Exercise per Week: 3 days   Minutes of Exercise per Session: 30 min  Stress: No Stress Concern Present   Feeling of Stress : Not at all  Social Connections: Socially Integrated   Frequency of Communication with Friends and Family: More than three times a week   Frequency of Social Gatherings with Friends and Family: More than three times a week   Attends Religious Services: More than 4 times per year   Active Member of [employer] or Organizations: Yes   Attends Archivist Meetings: 1 to 4 times per year   Marital Status: Married  Human resources officer Violence: Not At Risk   Fear of Current or Ex-Partner: No   Emotionally Abused: No   Physically Abused: No   Sexually Abused: No    Family History:   The patient's family history includes Cancer in his mother; Heart attack in his father.    ROS:  Please see the history of present illness.  General:no colds or fevers, no weight changes Skin:no rashes or ulcers HEENT:no blurred vision, no congestion CV:see HPI PUL:see HPI GI:no diarrhea constipation or melena, no indigestion GU:no hematuria, no dysuria MS:no joint pain, no claudication Neuro:no syncope, no lightheadedness Endo:no diabetes, no thyroid disease All other ROS reviewed and negative.     Physical Exam/Data:   Vitals:   08/18/21 1402 08/18/21 1600 08/18/21 1630 08/18/21 1700  BP:  (!) 154/62 (!) 146/102 (!) 144/128  Pulse:  62 (!) 35 (!) 38  Resp:  (!) '22 19 16  ' Temp:  98.4 F (36.9 C)    TempSrc:  Oral    SpO2:  98% 100% 100%  Weight: 70.3 kg     Height: '5\' 9"'  (1.753 m)      No intake or output data in the 24 hours ending 08/18/21 1749 Last 3 Weights 08/18/2021 06/11/2021 06/04/2021  Weight (lbs) 155 lb 171 lb 9.6 oz 173 lb  Weight (kg) 70.308 kg 77.837 kg 78.472 kg      Body mass index is 22.89 kg/m.   EXAM Per Dr. Harrell Gave     Relevant CV Studies: Echo 06/04/21 IMPRESSIONS     1. 29 mm Corevalve TAVR (04/28/2021). Vmax 1.2 m/s, MG 3 mmHG, EOA 3.33  cm2, DI 0.74. Trivial paravalvular leak noted. Normal prosthesis . The  aortic valve has been repaired/replaced. Aortic valve regurgitation is  trivial. There is a 29 mm Medtronic  Evolut Pro( TAVR) valve present in the aortic position. Procedure Date:  04/28/2021.   2. Left ventricular ejection fraction, by estimation, is 40 to 45%. The  left ventricle has mildly decreased function. The left ventricle  demonstrates global hypokinesis. There is moderate asymmetric left  ventricular hypertrophy of the basal-septal  segment. Left ventricular diastolic function could not be evaluated.   3. Right ventricular systolic function is mildly reduced. The right  ventricular size is mildly enlarged. There is mildly elevated pulmonary  artery systolic pressure. The estimated right ventricular systolic  pressure is 60.6 mmHg.   4. Left atrial size was mildly dilated.   5. Right atrial size was mildly dilated.   6. The mitral valve is grossly normal. Mild mitral valve regurgitation.  No evidence of mitral stenosis.   7. The inferior vena cava is normal in size with <50% respiratory  variability, suggesting right atrial pressure of 8 mmHg.   Comparison(s): No significant change from prior study.   FINDINGS   Left Ventricle: Left ventricular ejection fraction, by estimation,  is 40  to 45%. The left ventricle has mildly decreased function. The left  ventricle demonstrates global hypokinesis. The left ventricular internal  cavity size was normal in size. There is   moderate asymmetric left ventricular hypertrophy of the basal-septal  segment. Left ventricular diastolic function could not be evaluated due to  atrial fibrillation. Left ventricular diastolic function could not be  evaluated.   Right Ventricle:  The right ventricular size is mildly enlarged. No  increase in right ventricular wall thickness. Right ventricular systolic  function is mildly reduced. There is mildly elevated pulmonary artery  systolic pressure. The tricuspid regurgitant   velocity is 2.96 m/s, and with an assumed right atrial pressure of 8  mmHg, the estimated right ventricular systolic pressure is 27.0 mmHg.   Left Atrium: Left atrial size was mildly dilated.   Right Atrium: Right atrial size was mildly dilated.   Pericardium: Trivial pericardial effusion is present. Presence of  pericardial fat pad.   Mitral Valve: The mitral valve is grossly normal. Mild mitral valve  regurgitation. No evidence of mitral valve stenosis.   Tricuspid Valve: The tricuspid valve is grossly normal. Tricuspid valve  regurgitation is trivial. No evidence of tricuspid stenosis.   Aortic Valve: 29 mm Corevalve TAVR (04/28/2021). Vmax 1.2 m/s, MG 3 mmHG,  EOA 3.33 cm2, DI 0.74. Trivial paravalvular leak noted. Normal prosthesis.  The aortic valve has been repaired/replaced. Aortic valve regurgitation is  trivial. Aortic regurgitation  PHT measures 638 msec. Aortic valve mean gradient measures 3.0 mmHg.  Aortic valve peak gradient measures 5.7 mmHg. Aortic valve area, by VTI  measures 3.33 cm. There is a 29 mm Medtronic Evolut Pro( TAVR) valve  present in the aortic position. Procedure  Date: 04/28/2021.   Pulmonic Valve: The pulmonic valve was grossly normal. Pulmonic valve  regurgitation is trivial. No evidence of pulmonic stenosis.   Aorta: The aortic root and ascending aorta are structurally normal, with  no evidence of dilitation.   Venous: The inferior vena cava is normal in size with less than 50%  respiratory variability, suggesting right atrial pressure of 8 mmHg.   IAS/Shunts: The atrial septum is grossly normal.   Cardiac cath 09/23/20   Balloon angioplasty was performed using a BALLOON SAPPHIRE [place] 3.5X12. Post  intervention, there is a 20% residual stenosis.   1.  Severe native three-vessel coronary artery disease 2.  Status post aortocoronary bypass surgery with continued patency of the LIMA to LAD, saphenous vein graft to left posterior lateral branch, and saphenous vein graft to right PDA 3.  Severe in-stent restenosis in the saphenous vein graft to obtuse marginal (2 layers of stent), treated successfully with noncompliant balloon angioplasty (3.5 mm noncompliant balloon to 22 atm) 4.  Moderate aortic stenosis with mean gradient 14 mmHg, calculated aortic valve area 1.37 cm, moderate amount of difficulty crossing the valve with a straight wire, suspect component of paradoxical low flow low gradient aortic stenosis 5.  Normal diastolic filling pressures in the right and left heart   Recommendations: Aspirin x30 days, clopidogrel 75 mg at least 1 month favor 6 months if tolerated, resume apixaban tomorrow   Total contrast: 40 cc Diagnostic Dominance: Co-dominant Intervention   Laboratory Data:  High Sensitivity Troponin:   Recent Labs  Lab 08/18/21 1358 08/18/21 1558  TROPONINIHS 78* 90*      Chemistry Recent Labs  Lab 08/18/21 1358  NA 129*  K 3.7  CL 95*  CO2 23  GLUCOSE 260*  BUN 37*  CREATININE 2.06*  CALCIUM 9.8  GFRNONAA 31*  ANIONGAP 11    No results for input(s): PROT, ALBUMIN, AST, ALT, ALKPHOS, BILITOT in the last 168 hours. Lipids No results for input(s): CHOL, TRIG, HDL, LABVLDL, LDLCALC, CHOLHDL in the last 168 hours. Hematology Recent Labs  Lab 08/18/21 1358  WBC 14.8*  RBC 5.52  HGB 14.1  HCT 44.1  MCV 79.9*  MCH 25.5*  MCHC 32.0  RDW 15.8*  PLT 147*   Thyroid No results for input(s): TSH, FREET4 in the last 168 hours. BNP Recent Labs  Lab 08/18/21 1420  BNP 590.2*    DDimer No results for input(s): DDIMER in the last 168 hours.   Radiology/Studies:  DG Chest 2 View  Result Date: 08/18/2021 CLINICAL DATA:  Chest pain EXAM: CHEST - 2 VIEW  COMPARISON:  CT chest 03/26/2021, chest x-ray 05/27/2021, chest x-ray 07/26/2019 FINDINGS: The heart and mediastinal contours are unchanged. Aortic valve replacement. Aortic calcification. Coronary artery calcification and possible stent. Cardiac surgical changes overlie the mediastinum. Redemonstration of an approximately 2 cm left lower lobe nodular-like density. No focal consolidation. No pulmonary edema. No pleural effusion. No pneumothorax. No acute osseous abnormality. IMPRESSION: Redemonstration of an approximately 2 cm left lower lobe nodular-like density. Recommend CT chest with intravenous contrast for further evaluation. Electronically Signed   By: Iven Finn M.D.   On: 08/18/2021 15:43     Assessment and Plan:   [place]/NSTEMI with significant CAD with CABG in 2009 and PCIs since though last cath grafts patent.  Last stent 09/2020 - will hold eliquis and begin IV heparin.  Will give IV fluids overnight and plan for cath tomorrow.  On lasix bid wil hold continue BB and amlodipine.  CAD with CABG and stents since CABG - see above. TAVR in 04/2021 Persistent atrial fib on eliquis and rate controlled.  DM-2 on [place] will cover with SSI  HLD on statin continue    Risk Assessment/Risk Scores:    TIMI Risk Score for Unstable Angina or Non-ST Elevation MI:   The patient's TIMI risk score is  , which indicates a  % risk of all cause mortality, new or recurrent myocardial infarction or need for urgent revascularization in the next 14 days.    CHA2DS2-VASc Score = 5   This indicates a 7.2% annual risk of stroke. The patient's score is based upon: CHF History: 1 HTN History: 1 Diabetes History: 1 Stroke History: 0 Vascular Disease History: 0 Age Score: 2 Gender Score: 0      Severity of Illness: The appropriate patient status for this patient is INPATIENT. Inpatient status is judged to be reasonable and necessary in order to provide the required intensity of service  to ensure the patient's safety. The patient's presenting symptoms, physical exam findings, and initial radiographic and laboratory data in the context of their chronic comorbidities is felt to place them at high risk for further clinical deterioration. Furthermore, it is not anticipated that the patient will be medically stable for discharge from the hospital within 2 midnights of admission.   * I certify that at the point of admission it is my clinical judgment that the patient will require inpatient hospital care spanning beyond 2 midnights from the point of admission due to high intensity of service, high risk for further deterioration and high frequency of surveillance required.*   For questions or updates, please contact [place] Please consult [email or web address] for contact info under     Signed, Cecilie Kicks,  NP  08/18/2021 5:49 PM

## 2021-08-18 NOTE — ED Triage Notes (Signed)
Pt here POV with c/o reoccurrence chest pain over several weeks. Increased frequency. Pt takes nitro at home with relief. Within hours, pain returns. Endorses nausea and weight loss of 15lb in 10lbs. Has not taken nitro today. 4/10 pain. Cardiologist told pt to come to ED and follow up tomorrow with cards.

## 2021-08-18 NOTE — ED Provider Notes (Signed)
Emergency Medicine Provider Triage Evaluation Note  [name] , a 87 y.o. male  was evaluated in triage.  Pt complains of left-sided chest pain that has been intermittent over the last week.  Does radiate to the left shoulder and neck at times.  Relieved with nitroglycerin.  He also reports associated generalized weakness, dyspnea on exertion, and 10 pound weight loss over the last week.  He denies any leg pain, leg swelling, abdominal pain, diarrhea, fever, chills.  Reports associated nausea. History of aortic valve replacement.  Review of Systems  Positive:  Negative: See above   Physical Exam  BP (!) 132/94 (BP Location: Left Arm)   Pulse 72   Temp 98.5 F (36.9 C) (Oral)   Resp 16   Ht 5\' 9"  (1.753 m)   Wt 70.3 kg   SpO2 98%   BMI 22.89 kg/m  Gen:   Awake, no distress   Resp:  Normal effort  MSK:   Moves extremities without difficulty  Other:    Medical Decision Making  Medically screening exam initiated at 2:19 PM.  Appropriate orders placed.  [name] was informed that the remainder of the evaluation will be completed by another provider, this initial triage assessment does not replace that evaluation, and the importance of remaining in the ED until their evaluation is complete.     Myna Bright Farner, PA-C 08/18/21 1421    Truddie Hidden, MD 08/18/21 [phone]

## 2021-08-18 NOTE — Telephone Encounter (Signed)
Received stat call into triage from patients wife. Patient's wife states that patient is experiencing chest pain in the center of his chest that has been going on for a little over a week. Patient's wife states that per patient the chest pain is better with rest and nitroglycerin and is worse with activity. Patient's wife states that patient is also very weak and is unable to hardly get up and move around. Patient's wife states that patient does not have any swelling at this time. Patient's wife states that patient has also lost 15 pounds in a week, is short of breath and dizzy with changing positions. Per patient's wife he has just progressively gotten worse over the last week. Advised patient's wife that due to patients symptoms and history he should report to the ER to be seen to rule out any acute issues. Also made patient an appointment to see Dr. Percival Spanish tomorrow on 11/2 at 8:30 am. Patient and patient's wife both agreeable to plan and verbalized understanding. She will take patient to [facility] to be evaluated. Advised her I would forward message to Dr. Percival Spanish to make him aware. Patient's wife verbalized understanding.

## 2021-08-19 ENCOUNTER — Encounter (HOSPITAL_COMMUNITY): Payer: Self-pay | Admitting: Cardiovascular Disease

## 2021-08-19 ENCOUNTER — Encounter (HOSPITAL_COMMUNITY)
Admission: EM | Disposition: A | Discharge status: Home / Self Care | Payer: Self-pay | Source: Home / Self Care | Attending: Cardiology

## 2021-08-19 ENCOUNTER — Ambulatory Visit: Payer: Medicare PPO | Admitting: Cardiology

## 2021-08-19 DIAGNOSIS — I10 Essential (primary) hypertension: Secondary | ICD-10-CM

## 2021-08-19 DIAGNOSIS — Z951 Presence of aortocoronary bypass graft: Secondary | ICD-10-CM

## 2021-08-19 DIAGNOSIS — I4819 Other persistent atrial fibrillation: Secondary | ICD-10-CM

## 2021-08-19 DIAGNOSIS — E785 Hyperlipidemia, unspecified: Secondary | ICD-10-CM

## 2021-08-19 DIAGNOSIS — I4811 Longstanding persistent atrial fibrillation: Secondary | ICD-10-CM

## 2021-08-19 DIAGNOSIS — Z952 Presence of prosthetic heart valve: Secondary | ICD-10-CM

## 2021-08-19 DIAGNOSIS — R072 Precordial pain: Secondary | ICD-10-CM

## 2021-08-19 DIAGNOSIS — I5042 Chronic combined systolic (congestive) and diastolic (congestive) heart failure: Secondary | ICD-10-CM

## 2021-08-19 DIAGNOSIS — Z955 Presence of coronary angioplasty implant and graft: Secondary | ICD-10-CM

## 2021-08-19 DIAGNOSIS — I25810 Atherosclerosis of coronary artery bypass graft(s) without angina pectoris: Secondary | ICD-10-CM

## 2021-08-19 DIAGNOSIS — I214 Non-ST elevation (NSTEMI) myocardial infarction: Secondary | ICD-10-CM

## 2021-08-19 HISTORY — PX: CORONARY BALLOON ANGIOPLASTY: CATH118233

## 2021-08-19 HISTORY — PX: LEFT HEART CATH AND CORS/GRAFTS ANGIOGRAPHY: CATH118250

## 2021-08-19 LAB — URINALYSIS, ROUTINE W REFLEX MICROSCOPIC
Bacteria, UA: NONE SEEN
Bilirubin Urine: NEGATIVE
Glucose, UA: >=500 mg/dL — AB
Hgb urine dipstick: NEGATIVE
Ketones, ur: NEGATIVE mg/dL
Leukocytes,Ua: NEGATIVE
Nitrite: NEGATIVE
Protein, ur: 30 mg/dL — AB
Specific Gravity, Urine: 1.016 (ref 1.005–1.030)
pH: 5 (ref 5.0–8.0)

## 2021-08-19 LAB — CBC
HCT: 40.4 % (ref 39.0–52.0)
Hemoglobin: 13.3 g/dL (ref 13.0–17.0)
MCH: 25.8 pg — ABNORMAL LOW (ref 26.0–34.0)
MCHC: 32.9 g/dL (ref 30.0–36.0)
MCV: 78.4 fL — ABNORMAL LOW (ref 80.0–100.0)
Platelets: 124 10*3/uL — ABNORMAL LOW (ref 150–400)
RBC: 5.15 MIL/uL (ref 4.22–5.81)
RDW: 15.5 % (ref 11.5–15.5)
WBC: 13.8 10*3/uL — ABNORMAL HIGH (ref 4.0–10.5)
nRBC: 0 % (ref 0.0–0.2)

## 2021-08-19 LAB — TROPONIN I (HIGH SENSITIVITY): Troponin I (High Sensitivity): 89 ng/L — ABNORMAL HIGH (ref <18)

## 2021-08-19 LAB — BASIC METABOLIC PANEL
Anion gap: 9 (ref 5–15)
BUN: 32 mg/dL — ABNORMAL HIGH (ref 8–23)
CO2: 21 mmol/L — ABNORMAL LOW (ref 22–32)
Calcium: 9.4 mg/dL (ref 8.9–10.3)
Chloride: 102 mmol/L (ref 98–111)
Creatinine, Ser: 1.73 mg/dL — ABNORMAL HIGH (ref 0.61–1.24)
GFR, Estimated: 38 mL/min — ABNORMAL LOW (ref >60)
Glucose, Bld: 147 mg/dL — ABNORMAL HIGH (ref 70–99)
Potassium: 4 mmol/L (ref 3.5–5.1)
Sodium: 132 mmol/L — ABNORMAL LOW (ref 135–145)

## 2021-08-19 LAB — POCT ACTIVATED CLOTTING TIME
Activated Clotting Time: 335 seconds
Activated Clotting Time: 248 seconds
Activated Clotting Time: 190 seconds
Activated Clotting Time: 161 seconds

## 2021-08-19 LAB — LIPID PANEL
Cholesterol: 122 mg/dL (ref 0–200)
HDL: 35 mg/dL — ABNORMAL LOW (ref >40)
LDL Cholesterol: 73 mg/dL (ref 0–99)
Total CHOL/HDL Ratio: 3.5 RATIO
Triglycerides: 68 mg/dL (ref <150)
VLDL: 14 mg/dL (ref 0–40)

## 2021-08-19 LAB — APTT
aPTT: 72 seconds — ABNORMAL HIGH (ref 24–36)
aPTT: >200 seconds (ref 24–36)

## 2021-08-19 LAB — GLUCOSE, CAPILLARY
Glucose-Capillary: 166 mg/dL — ABNORMAL HIGH (ref 70–99)
Glucose-Capillary: 155 mg/dL — ABNORMAL HIGH (ref 70–99)

## 2021-08-19 LAB — CBG MONITORING, ED: Glucose-Capillary: 116 mg/dL — ABNORMAL HIGH (ref 70–99)

## 2021-08-19 LAB — HEPARIN LEVEL (UNFRACTIONATED): Heparin Unfractionated: >1.1 IU/mL — ABNORMAL HIGH (ref 0.30–0.70)

## 2021-08-19 SURGERY — LEFT HEART CATH AND CORS/GRAFTS ANGIOGRAPHY
Anesthesia: LOCAL

## 2021-08-19 MED ORDER — SODIUM CHLORIDE 0.9 % IV SOLN: 250.0000 mL | INTRAVENOUS | Status: DC | PRN

## 2021-08-19 MED ORDER — LIDOCAINE HCL (PF) 1 % IJ SOLN
INTRAMUSCULAR | Status: AC
  Filled 2021-08-19: qty 30

## 2021-08-19 MED ORDER — SODIUM CHLORIDE 0.9 % IV SOLN
INTRAVENOUS | Status: AC
  Administered 2021-08-19: 300 mL via INTRAVENOUS

## 2021-08-19 MED ORDER — HEPARIN (PORCINE) IN NACL 1000-0.9 UT/500ML-% IV SOLN
INTRAVENOUS | Status: DC | PRN
  Administered 2021-08-19 (×2): 500 mL

## 2021-08-19 MED ORDER — HYDRALAZINE HCL 20 MG/ML IJ SOLN: 10.0000 mg | INTRAMUSCULAR | Status: AC | PRN

## 2021-08-19 MED ORDER — MIDAZOLAM HCL 2 MG/2ML IJ SOLN
INTRAMUSCULAR | Status: DC | PRN
  Administered 2021-08-19 (×3): 1 mg via INTRAVENOUS

## 2021-08-19 MED ORDER — FENTANYL CITRATE (PF) 100 MCG/2ML IJ SOLN
INTRAMUSCULAR | Status: AC
  Filled 2021-08-19: qty 2

## 2021-08-19 MED ORDER — CLOPIDOGREL BISULFATE 75 MG PO TABS
75.0000 mg | ORAL_TABLET | Freq: Every day | ORAL | Status: DC
  Administered 2021-08-20: 75 mg via ORAL
  Filled 2021-08-19: qty 1

## 2021-08-19 MED ORDER — SODIUM CHLORIDE 0.9% FLUSH: 3.0000 mL | Freq: Two times a day (BID) | INTRAVENOUS | Status: DC

## 2021-08-19 MED ORDER — CLOPIDOGREL BISULFATE 300 MG PO TABS
ORAL_TABLET | ORAL | Status: DC | PRN
  Administered 2021-08-19: 600 mg via ORAL

## 2021-08-19 MED ORDER — INFLUENZA VAC A&B SA ADJ QUAD 0.5 ML IM PRSY
0.5000 mL | PREFILLED_SYRINGE | INTRAMUSCULAR | Status: AC
  Administered 2021-08-20: 0.5 mL via INTRAMUSCULAR
  Filled 2021-08-19: qty 0.5

## 2021-08-19 MED ORDER — SODIUM CHLORIDE 0.9% FLUSH: 3.0000 mL | INTRAVENOUS | Status: DC | PRN

## 2021-08-19 MED ORDER — MIDAZOLAM HCL 2 MG/2ML IJ SOLN
INTRAMUSCULAR | Status: AC
  Filled 2021-08-19: qty 2

## 2021-08-19 MED ORDER — HEPARIN (PORCINE) IN NACL 1000-0.9 UT/500ML-% IV SOLN
INTRAVENOUS | Status: AC
  Filled 2021-08-19: qty 500

## 2021-08-19 MED ORDER — IOHEXOL 350 MG/ML SOLN
INTRAVENOUS | Status: DC | PRN
  Administered 2021-08-19: 105 mL

## 2021-08-19 MED ORDER — HEPARIN SODIUM (PORCINE) 1000 UNIT/ML IJ SOLN
INTRAMUSCULAR | Status: DC | PRN
  Administered 2021-08-19: 7000 [IU] via INTRAVENOUS

## 2021-08-19 MED ORDER — FENTANYL CITRATE (PF) 100 MCG/2ML IJ SOLN
INTRAMUSCULAR | Status: DC | PRN
  Administered 2021-08-19 (×3): 25 ug via INTRAVENOUS

## 2021-08-19 MED ORDER — CLOPIDOGREL BISULFATE 300 MG PO TABS
ORAL_TABLET | ORAL | Status: AC
  Filled 2021-08-19: qty 2

## 2021-08-19 MED ORDER — VERAPAMIL HCL 2.5 MG/ML IV SOLN
INTRAVENOUS | Status: AC
  Filled 2021-08-19: qty 2

## 2021-08-19 MED ORDER — LABETALOL HCL 5 MG/ML IV SOLN: 10.0000 mg | INTRAVENOUS | Status: AC | PRN

## 2021-08-19 MED ORDER — LIDOCAINE HCL (PF) 1 % IJ SOLN
INTRAMUSCULAR | Status: DC | PRN
  Administered 2021-08-19: 10 mL

## 2021-08-19 SURGICAL SUPPLY — 19 items
BALLN ~~LOC~~ EMERGE MR 3.5X12 (BALLOONS) ×2
BALLOON ~~LOC~~ EMERGE MR 3.5X12 (BALLOONS) IMPLANT
CATH INFINITI 5 FR MPA2 (CATHETERS) ×1 IMPLANT
CATH INFINITI 5FR MULTPACK ANG (CATHETERS) ×1 IMPLANT
CATH VISTA GUIDE 6FR AL1 (CATHETERS) ×1 IMPLANT
CATH VISTA GUIDE 6FR JR4 (CATHETERS) ×1 IMPLANT
CATH VISTA GUIDE 6FR MPA1 (CATHETERS) ×1 IMPLANT
GUIDELINER 6F (CATHETERS) ×1 IMPLANT
KIT ENCORE 26 ADVANTAGE (KITS) ×1 IMPLANT
KIT HEART LEFT (KITS) ×3 IMPLANT
KIT MICROPUNCTURE NIT STIFF (SHEATH) ×1 IMPLANT
PACK CARDIAC CATHETERIZATION (CUSTOM PROCEDURE TRAY) ×3 IMPLANT
SHEATH PINNACLE 5F 10CM (SHEATH) ×1 IMPLANT
SHEATH PINNACLE 6F 10CM (SHEATH) ×1 IMPLANT
SHEATH PROBE COVER 6X72 (BAG) ×1 IMPLANT
TRANSDUCER W/STOPCOCK (MISCELLANEOUS) ×3 IMPLANT
TUBING CIL FLEX 10 FLL-RA (TUBING) ×3 IMPLANT
WIRE COUGAR XT STRL 300CM (WIRE) ×1 IMPLANT
WIRE EMERALD 3MM-J .035X150CM (WIRE) ×1 IMPLANT

## 2021-08-19 NOTE — Telephone Encounter (Signed)
Patient is currently admitted

## 2021-08-19 NOTE — H&P (View-Only) (Signed)
Progress Note  Patient Name: [name] Date of Encounter: 08/19/2021  [facility] Cardiologist: [name], MD   Subjective   Had three brief episodes of chest pain that resolved spontaneously this AM. None currently. Amenable to cath this AM.  Inpatient Medications    Scheduled Meds:  amLODipine  5 mg Oral Daily   aspirin EC  81 mg Oral Daily   carvedilol  6.25 mg Oral BID WC   empagliflozin  10 mg Oral QAC breakfast   FLUoxetine  10 mg Oral Daily   insulin aspart  0-5 Units Subcutaneous QHS   insulin aspart  0-9 Units Subcutaneous TID WC   mometasone-formoterol  2 puff Inhalation BID   pantoprazole  40 mg Oral Daily   rosuvastatin  20 mg Oral Daily   sodium chloride flush  3 mL Intravenous Q12H   spironolactone  25 mg Oral Daily   vitamin B-12  250 mcg Oral Daily   Continuous Infusions:  sodium chloride 10 mL/hr at 08/18/21 2152   sodium chloride 1 mL/kg/hr (08/19/21 0612)   heparin 850 Units/hr (08/19/21 0406)   PRN Meds: acetaminophen, albuterol, ALPRAZolam, diphenhydrAMINE, docusate sodium, nitroGLYCERIN, ondansetron (ZOFRAN) IV   Vital Signs    Vitals:   08/19/21 0400 08/19/21 0500 08/19/21 0611 08/19/21 0730  BP: (!) 127/54 (!) 122/56  (!) 147/104  Pulse: (!) 43 82  78  Resp: '13 13  16  ' Temp:   97.7 F (36.5 C)   TempSrc:   Oral   SpO2: 97% 95%  98%  Weight:      Height:        Intake/Output Summary (Last 24 hours) at 08/19/2021 1003 Last data filed at 08/19/2021 0600 Gross per 24 hour  Intake 72.67 ml  Output 550 ml  Net -477.33 ml   Last 3 Weights 08/18/2021 06/11/2021 06/04/2021  Weight (lbs) 155 lb 171 lb 9.6 oz 173 lb  Weight (kg) 70.308 kg 77.837 kg 78.472 kg      Telemetry    Afib, rate controlled - Personally Reviewed  ECG    No new - Personally Reviewed  Physical Exam  GEN: Well nourished, well developed in no acute distress HEENT: Normal, moist mucous membranes NECK: No JVD CARDIAC: irregularly irregular rhythm,  normal S1 and S2, no rubs or gallops. 1/6 systolic murmur. VASCULAR: Radial and DP pulses 2+ bilaterally. No carotid bruits RESPIRATORY:  Clear to auscultation without rales, wheezing or rhonchi  ABDOMEN: Soft, non-tender, non-distended MUSCULOSKELETAL:  Ambulates independently SKIN: Warm and dry, no edema NEUROLOGIC:  Alert and oriented x 3. No focal neuro deficits noted. PSYCHIATRIC:  Normal affect    Labs    High Sensitivity Troponin:   Recent Labs  Lab 08/18/21 1358 08/18/21 1558 08/19/21 0505  TROPONINIHS 78* 90* 89*     Chemistry Recent Labs  Lab 08/18/21 1358 08/18/21 2123 08/19/21 0233  NA 129*  --  132*  K 3.7  --  4.0  CL 95*  --  102  CO2 23  --  21*  GLUCOSE 260*  --  147*  BUN 37*  --  32*  CREATININE 2.06*  --  1.73*  CALCIUM 9.8  --  9.4  MG  --  2.4  --   GFRNONAA 31*  --  38*  ANIONGAP 11  --  9    Lipids  Recent Labs  Lab 08/19/21 0234  CHOL 122  TRIG 68  HDL 35*  LDLCALC 73  CHOLHDL 3.5  Hematology Recent Labs  Lab 08/18/21 1358 08/19/21 0233  WBC 14.8* 13.8*  RBC 5.52 5.15  HGB 14.1 13.3  HCT 44.1 40.4  MCV 79.9* 78.4*  MCH 25.5* 25.8*  MCHC 32.0 32.9  RDW 15.8* 15.5  PLT 147* 124*   Thyroid  Recent Labs  Lab 08/18/21 2123  TSH 2.079  FREET4 1.02    BNP Recent Labs  Lab 08/18/21 1420  BNP 590.2*    DDimer No results for input(s): DDIMER in the last 168 hours.   Radiology    DG Chest 2 View  Result Date: 08/18/2021 CLINICAL DATA:  Chest pain EXAM: CHEST - 2 VIEW COMPARISON:  CT chest 03/26/2021, chest x-ray 05/27/2021, chest x-ray 07/26/2019 FINDINGS: The heart and mediastinal contours are unchanged. Aortic valve replacement. Aortic calcification. Coronary artery calcification and possible stent. Cardiac surgical changes overlie the mediastinum. Redemonstration of an approximately 2 cm left lower lobe nodular-like density. No focal consolidation. No pulmonary edema. No pleural effusion. No pneumothorax. No acute  osseous abnormality. IMPRESSION: Redemonstration of an approximately 2 cm left lower lobe nodular-like density. Recommend CT chest with intravenous contrast for further evaluation. Electronically Signed   By: Iven Finn M.D.   On: 08/18/2021 15:43    Cardiac Studies   Echo 06/04/21 IMPRESSIONS     1. 29 mm Corevalve TAVR (04/28/2021). Vmax 1.2 m/s, MG 3 mmHG, EOA 3.33  cm2, DI 0.74. Trivial paravalvular leak noted. Normal prosthesis . The  aortic valve has been repaired/replaced. Aortic valve regurgitation is  trivial. There is a 29 mm Medtronic  Evolut Pro( TAVR) valve present in the aortic position. Procedure Date:  04/28/2021.   2. Left ventricular ejection fraction, by estimation, is 40 to 45%. The  left ventricle has mildly decreased function. The left ventricle  demonstrates global hypokinesis. There is moderate asymmetric left  ventricular hypertrophy of the basal-septal  segment. Left ventricular diastolic function could not be evaluated.   3. Right ventricular systolic function is mildly reduced. The right  ventricular size is mildly enlarged. There is mildly elevated pulmonary  artery systolic pressure. The estimated right ventricular systolic  pressure is 66.0 mmHg.   4. Left atrial size was mildly dilated.   5. Right atrial size was mildly dilated.   6. The mitral valve is grossly normal. Mild mitral valve regurgitation.  No evidence of mitral stenosis.   7. The inferior vena cava is normal in size with <50% respiratory  variability, suggesting right atrial pressure of 8 mmHg.   Comparison(s): No significant change from prior study.   FINDINGS   Left Ventricle: Left ventricular ejection fraction, by estimation, is 40  to 45%. The left ventricle has mildly decreased function. The left  ventricle demonstrates global hypokinesis. The left ventricular internal  cavity size was normal in size. There is   moderate asymmetric left ventricular hypertrophy of the  basal-septal  segment. Left ventricular diastolic function could not be evaluated due to  atrial fibrillation. Left ventricular diastolic function could not be  evaluated.   Right Ventricle: The right ventricular size is mildly enlarged. No  increase in right ventricular wall thickness. Right ventricular systolic  function is mildly reduced. There is mildly elevated pulmonary artery  systolic pressure. The tricuspid regurgitant   velocity is 2.96 m/s, and with an assumed right atrial pressure of 8  mmHg, the estimated right ventricular systolic pressure is 63.0 mmHg.   Left Atrium: Left atrial size was mildly dilated.   Right Atrium: Right atrial size was  mildly dilated.   Pericardium: Trivial pericardial effusion is present. Presence of  pericardial fat pad.   Mitral Valve: The mitral valve is grossly normal. Mild mitral valve  regurgitation. No evidence of mitral valve stenosis.   Tricuspid Valve: The tricuspid valve is grossly normal. Tricuspid valve  regurgitation is trivial. No evidence of tricuspid stenosis.   Aortic Valve: 29 mm Corevalve TAVR (04/28/2021). Vmax 1.2 m/s, MG 3 mmHG,  EOA 3.33 cm2, DI 0.74. Trivial paravalvular leak noted. Normal prosthesis.  The aortic valve has been repaired/replaced. Aortic valve regurgitation is  trivial. Aortic regurgitation  PHT measures 638 msec. Aortic valve mean gradient measures 3.0 mmHg.  Aortic valve peak gradient measures 5.7 mmHg. Aortic valve area, by VTI  measures 3.33 cm. There is a 29 mm Medtronic Evolut Pro( TAVR) valve  present in the aortic position. Procedure  Date: 04/28/2021.   Pulmonic Valve: The pulmonic valve was grossly normal. Pulmonic valve  regurgitation is trivial. No evidence of pulmonic stenosis.   Aorta: The aortic root and ascending aorta are structurally normal, with  no evidence of dilitation.   Venous: The inferior vena cava is normal in size with less than 50%  respiratory variability, suggesting  right atrial pressure of 8 mmHg.   IAS/Shunts: The atrial septum is grossly normal.   Cardiac cath 09/23/20   Balloon angioplasty was performed using a BALLOON SAPPHIRE [place] 3.5X12. Post intervention, there is a 20% residual stenosis.   1.  Severe native three-vessel coronary artery disease 2.  Status post aortocoronary bypass surgery with continued patency of the LIMA to LAD, saphenous vein graft to left posterior lateral branch, and saphenous vein graft to right PDA 3.  Severe in-stent restenosis in the saphenous vein graft to obtuse marginal (2 layers of stent), treated successfully with noncompliant balloon angioplasty (3.5 mm noncompliant balloon to 22 atm) 4.  Moderate aortic stenosis with mean gradient 14 mmHg, calculated aortic valve area 1.37 cm, moderate amount of difficulty crossing the valve with a straight wire, suspect component of paradoxical low flow low gradient aortic stenosis 5.  Normal diastolic filling pressures in the right and left heart   Recommendations: Aspirin x30 days, clopidogrel 75 mg at least 1 month favor 6 months if tolerated, resume apixaban tomorrow   Total contrast: 40 cc Diagnostic Dominance: Co-dominant Intervention     Patient Profile     87 y.o. male with complex past cardiac history, including CAD s/p CABG and multiple PCI, aortic stenosis s/p TAVR, chronic kidney disease stage 304, persistent atrial fibrillation who presented with chest pain.  Assessment & Plan    NSTEMI CAD s/p prior CABG and PCI Hyperlipidemia -hsTnI 78 > 90 > 89. Prior was 103 in 12/21 when he received stent -holding apixaban, last dose 2.5 mg 11/1 AM. Now on heparin -Cr improved with gentle hydration, down to 1.73 today -continue rosuvastatin -continue carvedilol -has not been on aspirin or other antiplatelet as he was on apixaban. Will start aspirin, long term antiplatelet based on results of cath   TAVR: No concern for heart failure on exam. Will consider repeating  echo based on results of cath.   Atrial fibrillation, persistent vs. Permanent: holding apixaban, renally dosed, last dose AM 11/1   Insomnia: uses tylenol PM PRN at home, will order   Type II diabetes: use SSI while admitted. Continuing Jardiance   Unintentional weight loss: concerning. Will need further evaluation, likely as an outpatient if cardiac workup unremarkable.  For questions or updates, please contact  [facility] Please consult [email or web address] for contact info under     Signed, Buford Dresser, MD  08/19/2021, 10:03 AM

## 2021-08-19 NOTE — Progress Notes (Signed)
Critical Value of PTT greater than 200 called to RN staff.  Writer made Virgel Gess and Dr. Harrell Gave of this expected finding after Cath lab.    Richardean Canal RN, BSN, CCRN

## 2021-08-19 NOTE — Interval H&P Note (Signed)
History and Physical Interval Note:  08/19/2021 10:55 AM  [name]  has presented today for surgery, with the diagnosis of unstable angina.  The various methods of treatment have been discussed with the patient and family. After consideration of risks, benefits and other options for treatment, the patient has consented to  Procedure(s): LEFT HEART CATH AND CORS/GRAFTS ANGIOGRAPHY (N/A) as a surgical intervention.  The patient's history has been reviewed, patient examined, no change in status, stable for surgery.  I have reviewed the patient's chart and labs.  Questions were answered to the patient's satisfaction.    Cath Lab Visit (complete for each Cath Lab visit)  Clinical Evaluation Leading to the Procedure:   ACS: Yes.    Non-ACS:    Anginal Classification: CCS IV  Anti-ischemic medical therapy: Maximal Therapy (2 or more classes of medications)  Non-Invasive Test Results: No non-invasive testing performed  Prior CABG: Previous CABG        Lauree Chandler

## 2021-08-19 NOTE — Progress Notes (Signed)
ANTICOAGULATION CONSULT NOTE - Follow Up Consult  Pharmacy Consult for IV Heparin Indication: chest pain/ACS  Allergies  Allergen Reactions   Codeine Other (See Comments)    Makes him feel goofy   Morphine Other (See Comments)    Nightmares and felt bad    Patient Measurements: Height: 5\' 9"  (175.3 cm) Weight: 70.3 kg (155 lb) IBW/kg (Calculated) : 70.7 Heparin Dosing Weight: 70.3  Vital Signs: Temp: 98.4 F (36.9 C) (11/01 1600) Temp Source: Oral (11/01 1600) BP: 152/89 (11/02 0200) Pulse Rate: 42 (11/02 0200)  Labs: Recent Labs    08/18/21 1358 08/18/21 1558 08/19/21 0233 08/19/21 0234  HGB 14.1  --  13.3  --   HCT 44.1  --  40.4  --   PLT 147*  --  124*  --   APTT  --   --  72*  --   HEPARINUNFRC  --   --   --  >1.10*  CREATININE 2.06*  --   --   --   TROPONINIHS 78* 90*  --   --      Estimated Creatinine Clearance: 25.6 mL/min (A) (by C-G formula based on SCr of 2.06 mg/dL (H)).   Assessment: 87 yo M presents with chest pain x past week relieved by nitroglycerin. Has a history significant for NSTEMI and CABG 2009. Last stent 09/2020. PTA eliquis 2.5mg  BID with LKD 11/1 @ 0600. Pharmacy consulted to start IV heparin. Heparin level affected by Eliquis, will utilize aPTT for monitoring until levels correlating.   Heparin level >1.1 (affected by Eliquis), aPTT 72 sec (therapeutic) on gtt at 850 units/hr. No bleeding noted.  Goal of Therapy:  Heparin level 0.3-0.7 units/ml aPTT 66-102 seconds Monitor platelets by anticoagulation protocol: Yes   Plan:  Continue heparin IV 850 units/hr  F/u 6hr aPTT to confirm therapeutic  Thank you for allowing pharmacy to participate in this patient's care.  Sherlon Handing, PharmD, BCPS Please see amion for complete clinical pharmacist phone list 08/19/2021,3:14 AM

## 2021-08-19 NOTE — Plan of Care (Signed)

## 2021-08-19 NOTE — Progress Notes (Signed)
SITE AREA: right groin  SITE PRIOR TO REMOVAL:  LEVEL 0  PRESSURE APPLIED FOR: approximately 20 minutes  MANUAL: yes  PATIENT STATUS DURING PULL: stable  POST PULL SITE:  LEVEL 0  POST PULL INSTRUCTIONS GIVEN: yes  POST PULL PULSES PRESENT: bilateral pedal pulses palpable at +1  DRESSING APPLIED: gauze with tegaderm  BEDREST BEGINS @ 1608  COMMENTS:  wife at bedside

## 2021-08-19 NOTE — Plan of Care (Signed)
  Problem: Cardiovascular: Goal: Ability to achieve and maintain adequate cardiovascular perfusion will improve Outcome: Progressing   Problem: Cardiovascular: Goal: Vascular access site(s) Level 0-1 will be maintained Outcome: Progressing   Problem: Health Behavior/Discharge Planning: Goal: Ability to manage health-related needs will improve Outcome: Progressing   Problem: Clinical Measurements: Goal: Ability to maintain clinical measurements within normal limits will improve Outcome: Progressing   Problem: Clinical Measurements: Goal: Cardiovascular complication will be avoided Outcome: Progressing   Problem: Safety: Goal: Ability to remain free from injury will improve Outcome: Progressing   Problem: Skin Integrity: Goal: Risk for impaired skin integrity will decrease Outcome: Progressing

## 2021-08-19 NOTE — Progress Notes (Signed)
 Progress Note  Patient Name: [name] Date of Encounter: 08/19/2021  [facility] Cardiologist: James Hochrein, MD   Subjective   Had three brief episodes of chest pain that resolved spontaneously this AM. None currently. Amenable to cath this AM.  Inpatient Medications    Scheduled Meds:  amLODipine  5 mg Oral Daily   aspirin EC  81 mg Oral Daily   carvedilol  6.25 mg Oral BID WC   empagliflozin  10 mg Oral QAC breakfast   FLUoxetine  10 mg Oral Daily   insulin aspart  0-5 Units Subcutaneous QHS   insulin aspart  0-9 Units Subcutaneous TID WC   mometasone-formoterol  2 puff Inhalation BID   pantoprazole  40 mg Oral Daily   rosuvastatin  20 mg Oral Daily   sodium chloride flush  3 mL Intravenous Q12H   spironolactone  25 mg Oral Daily   vitamin B-12  250 mcg Oral Daily   Continuous Infusions:  sodium chloride 10 mL/hr at 08/18/21 2152   sodium chloride 1 mL/kg/hr (08/19/21 0612)   heparin 850 Units/hr (08/19/21 0406)   PRN Meds: acetaminophen, albuterol, ALPRAZolam, diphenhydrAMINE, docusate sodium, nitroGLYCERIN, ondansetron (ZOFRAN) IV   Vital Signs    Vitals:   08/19/21 0400 08/19/21 0500 08/19/21 0611 08/19/21 0730  BP: (!) 127/54 (!) 122/56  (!) 147/104  Pulse: (!) 43 82  78  Resp: 13 13  16  Temp:   97.7 F (36.5 C)   TempSrc:   Oral   SpO2: 97% 95%  98%  Weight:      Height:        Intake/Output Summary (Last 24 hours) at 08/19/2021 1003 Last data filed at 08/19/2021 0600 Gross per 24 hour  Intake 72.67 ml  Output 550 ml  Net -477.33 ml   Last 3 Weights 08/18/2021 06/11/2021 06/04/2021  Weight (lbs) 155 lb 171 lb 9.6 oz 173 lb  Weight (kg) 70.308 kg 77.837 kg 78.472 kg      Telemetry    Afib, rate controlled - Personally Reviewed  ECG    No new - Personally Reviewed  Physical Exam  GEN: Well nourished, well developed in no acute distress HEENT: Normal, moist mucous membranes NECK: No JVD CARDIAC: irregularly irregular rhythm,  normal S1 and S2, no rubs or gallops. 1/6 systolic murmur. VASCULAR: Radial and DP pulses 2+ bilaterally. No carotid bruits RESPIRATORY:  Clear to auscultation without rales, wheezing or rhonchi  ABDOMEN: Soft, non-tender, non-distended MUSCULOSKELETAL:  Ambulates independently SKIN: Warm and dry, no edema NEUROLOGIC:  Alert and oriented x 3. No focal neuro deficits noted. PSYCHIATRIC:  Normal affect    Labs    High Sensitivity Troponin:   Recent Labs  Lab 08/18/21 1358 08/18/21 1558 08/19/21 0505  TROPONINIHS 78* 90* 89*     Chemistry Recent Labs  Lab 08/18/21 1358 08/18/21 2123 08/19/21 0233  NA 129*  --  132*  K 3.7  --  4.0  CL 95*  --  102  CO2 23  --  21*  GLUCOSE 260*  --  147*  BUN 37*  --  32*  CREATININE 2.06*  --  1.73*  CALCIUM 9.8  --  9.4  MG  --  2.4  --   GFRNONAA 31*  --  38*  ANIONGAP 11  --  9    Lipids  Recent Labs  Lab 08/19/21 0234  CHOL 122  TRIG 68  HDL 35*  LDLCALC 73  CHOLHDL 3.5      Hematology Recent Labs  Lab 08/18/21 1358 08/19/21 0233  WBC 14.8* 13.8*  RBC 5.52 5.15  HGB 14.1 13.3  HCT 44.1 40.4  MCV 79.9* 78.4*  MCH 25.5* 25.8*  MCHC 32.0 32.9  RDW 15.8* 15.5  PLT 147* 124*   Thyroid  Recent Labs  Lab 08/18/21 2123  TSH 2.079  FREET4 1.02    BNP Recent Labs  Lab 08/18/21 1420  BNP 590.2*    DDimer No results for input(s): DDIMER in the last 168 hours.   Radiology    DG Chest 2 View  Result Date: 08/18/2021 CLINICAL DATA:  Chest pain EXAM: CHEST - 2 VIEW COMPARISON:  CT chest 03/26/2021, chest x-ray 05/27/2021, chest x-ray 07/26/2019 FINDINGS: The heart and mediastinal contours are unchanged. Aortic valve replacement. Aortic calcification. Coronary artery calcification and possible stent. Cardiac surgical changes overlie the mediastinum. Redemonstration of an approximately 2 cm left lower lobe nodular-like density. No focal consolidation. No pulmonary edema. No pleural effusion. No pneumothorax. No acute  osseous abnormality. IMPRESSION: Redemonstration of an approximately 2 cm left lower lobe nodular-like density. Recommend CT chest with intravenous contrast for further evaluation. Electronically Signed   By: Morgane  Naveau M.D.   On: 08/18/2021 15:43    Cardiac Studies   Echo 06/04/21 IMPRESSIONS     1. 29 mm Corevalve TAVR (04/28/2021). Vmax 1.2 m/s, MG 3 mmHG, EOA 3.33  cm2, DI 0.74. Trivial paravalvular leak noted. Normal prosthesis . The  aortic valve has been repaired/replaced. Aortic valve regurgitation is  trivial. There is a 29 mm Medtronic  Evolut Pro( TAVR) valve present in the aortic position. Procedure Date:  04/28/2021.   2. Left ventricular ejection fraction, by estimation, is 40 to 45%. The  left ventricle has mildly decreased function. The left ventricle  demonstrates global hypokinesis. There is moderate asymmetric left  ventricular hypertrophy of the basal-septal  segment. Left ventricular diastolic function could not be evaluated.   3. Right ventricular systolic function is mildly reduced. The right  ventricular size is mildly enlarged. There is mildly elevated pulmonary  artery systolic pressure. The estimated right ventricular systolic  pressure is 43.0 mmHg.   4. Left atrial size was mildly dilated.   5. Right atrial size was mildly dilated.   6. The mitral valve is grossly normal. Mild mitral valve regurgitation.  No evidence of mitral stenosis.   7. The inferior vena cava is normal in size with <50% respiratory  variability, suggesting right atrial pressure of 8 mmHg.   Comparison(s): No significant change from prior study.   FINDINGS   Left Ventricle: Left ventricular ejection fraction, by estimation, is 40  to 45%. The left ventricle has mildly decreased function. The left  ventricle demonstrates global hypokinesis. The left ventricular internal  cavity size was normal in size. There is   moderate asymmetric left ventricular hypertrophy of the  basal-septal  segment. Left ventricular diastolic function could not be evaluated due to  atrial fibrillation. Left ventricular diastolic function could not be  evaluated.   Right Ventricle: The right ventricular size is mildly enlarged. No  increase in right ventricular wall thickness. Right ventricular systolic  function is mildly reduced. There is mildly elevated pulmonary artery  systolic pressure. The tricuspid regurgitant   velocity is 2.96 m/s, and with an assumed right atrial pressure of 8  mmHg, the estimated right ventricular systolic pressure is 43.0 mmHg.   Left Atrium: Left atrial size was mildly dilated.   Right Atrium: Right atrial size was   mildly dilated.   Pericardium: Trivial pericardial effusion is present. Presence of  pericardial fat pad.   Mitral Valve: The mitral valve is grossly normal. Mild mitral valve  regurgitation. No evidence of mitral valve stenosis.   Tricuspid Valve: The tricuspid valve is grossly normal. Tricuspid valve  regurgitation is trivial. No evidence of tricuspid stenosis.   Aortic Valve: 29 mm Corevalve TAVR (04/28/2021). Vmax 1.2 m/s, MG 3 mmHG,  EOA 3.33 cm2, DI 0.74. Trivial paravalvular leak noted. Normal prosthesis.  The aortic valve has been repaired/replaced. Aortic valve regurgitation is  trivial. Aortic regurgitation  PHT measures 638 msec. Aortic valve mean gradient measures 3.0 mmHg.  Aortic valve peak gradient measures 5.7 mmHg. Aortic valve area, by VTI  measures 3.33 cm. There is a 29 mm Medtronic Evolut Pro( TAVR) valve  present in the aortic position. Procedure  Date: 04/28/2021.   Pulmonic Valve: The pulmonic valve was grossly normal. Pulmonic valve  regurgitation is trivial. No evidence of pulmonic stenosis.   Aorta: The aortic root and ascending aorta are structurally normal, with  no evidence of dilitation.   Venous: The inferior vena cava is normal in size with less than 50%  respiratory variability, suggesting  right atrial pressure of 8 mmHg.   IAS/Shunts: The atrial septum is grossly normal.   Cardiac cath 09/23/20   Balloon angioplasty was performed using a BALLOON SAPPHIRE [place] 3.5X12. Post intervention, there is a 20% residual stenosis.   1.  Severe native three-vessel coronary artery disease 2.  Status post aortocoronary bypass surgery with continued patency of the LIMA to LAD, saphenous vein graft to left posterior lateral branch, and saphenous vein graft to right PDA 3.  Severe in-stent restenosis in the saphenous vein graft to obtuse marginal (2 layers of stent), treated successfully with noncompliant balloon angioplasty (3.5 mm noncompliant balloon to 22 atm) 4.  Moderate aortic stenosis with mean gradient 14 mmHg, calculated aortic valve area 1.37 cm, moderate amount of difficulty crossing the valve with a straight wire, suspect component of paradoxical low flow low gradient aortic stenosis 5.  Normal diastolic filling pressures in the right and left heart   Recommendations: Aspirin x30 days, clopidogrel 75 mg at least 1 month favor 6 months if tolerated, resume apixaban tomorrow   Total contrast: 40 cc Diagnostic Dominance: Co-dominant Intervention     Patient Profile     88 y.o. male with complex past cardiac history, including CAD s/p CABG and multiple PCI, aortic stenosis s/p TAVR, chronic kidney disease stage 304, persistent atrial fibrillation who presented with chest pain.  Assessment & Plan    NSTEMI CAD s/p prior CABG and PCI Hyperlipidemia -hsTnI 78 > 90 > 89. Prior was 103 in 12/21 when he received stent -holding apixaban, last dose 2.5 mg 11/1 AM. Now on heparin -Cr improved with gentle hydration, down to 1.73 today -continue rosuvastatin -continue carvedilol -has not been on aspirin or other antiplatelet as he was on apixaban. Will start aspirin, long term antiplatelet based on results of cath   TAVR: No concern for heart failure on exam. Will consider repeating  echo based on results of cath.   Atrial fibrillation, persistent vs. Permanent: holding apixaban, renally dosed, last dose AM 11/1   Insomnia: uses tylenol PM PRN at home, will order   Type II diabetes: use SSI while admitted. Continuing Jardiance   Unintentional weight loss: concerning. Will need further evaluation, likely as an outpatient if cardiac workup unremarkable.  For questions or updates, please contact   [facility] Please consult [email or web address] for contact info under     Signed, Duana Benedict, MD  08/19/2021, 10:03 AM    

## 2021-08-20 DIAGNOSIS — I25700 Atherosclerosis of coronary artery bypass graft(s), unspecified, with unstable angina pectoris: Secondary | ICD-10-CM

## 2021-08-20 DIAGNOSIS — R634 Abnormal weight loss: Secondary | ICD-10-CM

## 2021-08-20 LAB — BASIC METABOLIC PANEL
Anion gap: 8 (ref 5–15)
BUN: 23 mg/dL (ref 8–23)
CO2: 20 mmol/L — ABNORMAL LOW (ref 22–32)
Calcium: 9.6 mg/dL (ref 8.9–10.3)
Chloride: 105 mmol/L (ref 98–111)
Creatinine, Ser: 1.46 mg/dL — ABNORMAL HIGH (ref 0.61–1.24)
GFR, Estimated: 47 mL/min — ABNORMAL LOW (ref >60)
Glucose, Bld: 100 mg/dL — ABNORMAL HIGH (ref 70–99)
Potassium: 4.6 mmol/L (ref 3.5–5.1)
Sodium: 133 mmol/L — ABNORMAL LOW (ref 135–145)

## 2021-08-20 LAB — CBC
HCT: 41.1 % (ref 39.0–52.0)
Hemoglobin: 13.5 g/dL (ref 13.0–17.0)
MCH: 26 pg (ref 26.0–34.0)
MCHC: 32.8 g/dL (ref 30.0–36.0)
MCV: 79.2 fL — ABNORMAL LOW (ref 80.0–100.0)
Platelets: 123 10*3/uL — ABNORMAL LOW (ref 150–400)
RBC: 5.19 MIL/uL (ref 4.22–5.81)
RDW: 16.1 % — ABNORMAL HIGH (ref 11.5–15.5)
WBC: 12.9 10*3/uL — ABNORMAL HIGH (ref 4.0–10.5)
nRBC: 0 % (ref 0.0–0.2)

## 2021-08-20 LAB — GLUCOSE, CAPILLARY
Glucose-Capillary: 105 mg/dL — ABNORMAL HIGH (ref 70–99)
Glucose-Capillary: 201 mg/dL — ABNORMAL HIGH (ref 70–99)

## 2021-08-20 MED ORDER — CLOPIDOGREL BISULFATE 75 MG PO TABS: 75.0000 mg | ORAL_TABLET | Freq: Every day | ORAL | 2 refills | Status: DC

## 2021-08-20 MED ORDER — SODIUM CHLORIDE 0.9% FLUSH: 3.0000 mL | INTRAVENOUS | Status: DC | PRN

## 2021-08-20 MED ORDER — SODIUM CHLORIDE 0.9 % IV SOLN: 250.0000 mL | INTRAVENOUS | Status: DC | PRN

## 2021-08-20 MED ORDER — FUROSEMIDE 40 MG PO TABS: 40.0000 mg | ORAL_TABLET | Freq: Every day | ORAL | 3 refills | Status: DC

## 2021-08-20 NOTE — Progress Notes (Signed)
Discharge instructions (including medications) discussed with and copy provided to patient/caregiver 

## 2021-08-20 NOTE — Progress Notes (Signed)
CARDIAC REHAB PHASE I   Offered to walk with pt. Pt declines at this time as he is saving his energy for home. Has been OOB to BR, and felt steady on feet. Pt educated on importance of Plavix. Reviewed site care, restrictions, and exercise guidelines with emphasis on safety. Pt declines paperwork as he has received it many times in the past. Will refer to CRP [facility] [place] to meet the requirements, pt not interested in attending.  [id number] Rufina Falco, RN BSN 08/20/2021 9:56 AM

## 2021-08-20 NOTE — Plan of Care (Signed)

## 2021-08-20 NOTE — Discharge Summary (Signed)
Discharge Summary    Patient ID: [name] MRN: [id number]; DOB: 15-Jan-1933  Admit date: 08/18/2021 Discharge date: 08/20/2021  PCP:  [name], MD   [facility] Providers Cardiologist:  [name], MD   {  Discharge Diagnoses    Active Problems:   Unstable angina [facility])   NSTEMI (non-ST elevated myocardial infarction) ([place]) Acute on chronic kidney disease stage IIIb CAD Unintentional weight loss Chronic diastolic heart failure Hypertension Hyperlipidemia  Diagnostic Studies/Procedures    CORONARY BALLOON ANGIOPLASTY 08/19/2021  LEFT HEART CATH AND CORS/GRAFTS ANGIOGRAPHY   Conclusion      LM lesion is 25% stenosed.   Ost LM to Mid LM lesion is 50% stenosed.   Mid LAD lesion is 80% stenosed.   Mid Cx to Dist Cx lesion is 80% stenosed.   Ost RCA to Prox RCA lesion is 100% stenosed.   Origin lesion is 40% stenosed.   RPDA lesion is 30% stenosed.   SVG to OM1 is patent. Dist Graft to Insertion lesion is 95% stenosed.   Balloon angioplasty was performed using a BALLN [place] EMERGE MR 3.5X12.   Post intervention, there is a 30% residual stenosis.   LIMA to LAD is patent   SVG to PDA is patent   SVG to OM2 is patent   Severe triple vessel CAD s/p 4V CABG with 4/4 patent grafts Unable to selectively engage the left main artery due to obstruction of the ostium by the TAVR valve stent cage. Based on non-selective angiography the LAD and Circumflex are patent with anatomy likely unchanged from cath in 2022 but cannot definitively say this. All three grafts to the left system are patent.  Chronic occlusion proximal RCA Patent LIMA to LAD, SVG to OM1, SVG to OM2 and SVG to PDA Severe restenosis in the distal body of SVG to OM1 in the previously stented segment Successful PTCA with balloon angioplasty only of the restenosis within the distal body of the vein graft stented segment   Recommendations: I would plan to d/c home tomorrow with plans for Plavix and  Eliquis for 6 months if possible. Stop ASA before discharge.   Diagnostic Dominance: Co-dominant Intervention     History of Present Illness     [name] is a 87 y.o. male with CAD s/p CABG and multiple PCI, aortic stenosis s/p TAVR, chronic kidney disease stage 3-4, persistent atrial fibrillation, COPD, HTN, DM, chronic diastolic CHF, and LBBB presented for chest pain evaluation.   Hx of CABG X 4, LIMA to LAD, VG to PDA, VG to 1st OM, SVG to PLA. multiple PCIs since.  Last cath 09/23/20 with severe native 3 vessel disease,  patency of the LIMA to LAD, saphenous vein graft to left posterior lateral branch, and saphenous vein graft to right PDA, severe in-stent restenosis in the saphenous vein graft to obtuse marginal (2 layers of stent), treated successfully with noncompliant balloon angioplasty (3.5 mm noncompliant balloon to 22 atm) Moderate aortic stenosis with mean gradient 14 mmHg, calculated aortic valve area 1.37 cm, moderate amount of difficulty crossing the valve with a straight wire, suspect component of paradoxical low flow low gradient aortic stenosis.  He went on to have TAVR A Medtronic CoreValve Evolut ProPlus transcatheter heart valve (size29 mm, serial #[id number]) 04/28/21.   Last echo 06/04/21 with EF 40-45%, normally functioning TAVR, a mean gradient of 2 mmHg and trivial PVL, mild pulm HTN, mild LAE.     Presented with L sided chest pain that has  been intermittent does radiate to lt shoulder and neck at times.  NTG with relief. Has also had generalized weakness, dyspnea on exertion and 10 pound wt loss. No cold symptoms no diarrhea, no fever.  He has had some nausea.     EKG with atrial fib rate controlled LBBB LVH   Hs troponin 78 and 90 BNP 590 Na 129, K+ 3.7 glucose 260 BUN 37, Cr 2.06  WBC 14.8, Ua ordered, Hgb 14 plts 147 BP 157/74 P 60s     Hospital Course     Consultants: None   NSTEMI/CAD His apixaban held and started on heparin per pharmacy with plan for  cardiac catheterization next day.  He was given gentle fluids.  Started on aspirin 81 mg daily.  Had brief episode of chest pain overnight with spontaneous resolution. hsTnI 78 > 90 > 89.  Cardiac catheterization as noted above showing patent 4 out of 4 grafts. Severe restenosis in the distal body of SVG to OM1 in the previously stented segment s/p successful PTCA with balloon angioplasty only of the restenosis within the distal body of the vein graft stented segment.  Plan to treat with Plavix and Eliquis for 6 months.  Discontinued aspirin.  Patient ambulated well without any problem. CRP II as outpatient.   2.  Atrial fibrillation, persistent versus permanent -Remains in atrial fibrillation with controlled ventricular rate -Treated with Heparin while off anticoagulation -Resume Eliquis at discharge  3.  Unintentional weight loss -Recommended outpatient work-up with primary care provider  4.  Diabetes mellitus -Scale insulin while admitted -Continue Jardiance  5.  Acute on chronic kidney disease stage IIIb 6. Chronic diastolic CHF - Home lasix 77m BID held on admit. Treated with gentle fluids for cath.  - Creatinine this admission 2.06>>>1.73>>1.46 - Lasix resumed at lower dose at discharge at 456mdaily   7. HTN -Blood pressure was relatively stable on amlodipine 5 mg daily, spironolactone 25 mg daily and carvedilol 6.25 mg twice daily  Did the patient have an acute coronary syndrome (MI, NSTEMI, STEMI, etc) this admission?:  Yes                               AHA/ACC Clinical Performance & Quality Measures: Aspirin prescribed? - No - Only angioplasty, on  anticoagulation  ADP Receptor Inhibitor (Plavix/Clopidogrel, Brilinta/Ticagrelor or Effient/Prasugrel) prescribed (includes medically managed patients)? - Yes Beta Blocker prescribed? - Yes High Intensity Statin (Lipitor 40-808mr Crestor 20-44m73mrescribed? - Yes EF assessed during THIS hospitalization? - No- recently had Echo  06/04/2021 For EF <40%, was ACEI/ARB prescribed? - Not Applicable (EF >/= 40%)41%r EF <40%, Aldosterone Antagonist (Spironolactone or Eplerenone) prescribed? - Yes Cardiac Rehab Phase II ordered (including medically managed patients)? - Yes   The patient will be scheduled for a TOC follow up appointment on 11/29 (First available). A message has been sent to the TOC [facility] Scheduling Pool at the office where the patient should be seen for follow up.   Discharge Vitals Blood pressure (!) 145/72, pulse 80, temperature 98.1 F (36.7 C), temperature source Oral, resp. rate 15, height _0  (1.753 m), weight 70.5 kg, SpO2 97 %.  Filed Weights   08/18/21 1402 08/20/21 0415  Weight: 70.3 kg 70.5 kg    Labs & Radiologic Studies    CBC Recent Labs    08/19/21 0233 08/20/21 0338  WBC 13.8* 12.9*  HGB 13.3 13.5  HCT 40.4 41.1  MCV 78.4* 79.2*  PLT 124* 426*   Basic Metabolic Panel Recent Labs    08/18/21 2123 08/19/21 0233 08/20/21 0338  NA  --  132* 133*  K  --  4.0 4.6  CL  --  102 105  CO2  --  21* 20*  GLUCOSE  --  147* 100*  BUN  --  32* 23  CREATININE  --  1.73* 1.46*  CALCIUM  --  9.4 9.6  MG 2.4  --   --     High Sensitivity Troponin:   Recent Labs  Lab 08/18/21 1358 08/18/21 1558 08/19/21 0505  TROPONINIHS 78* 90* 89*     Hemoglobin A1C Recent Labs    08/18/21 2123  HGBA1C 8.6*   Fasting Lipid Panel Recent Labs    08/19/21 0234  CHOL 122  HDL 35*  LDLCALC 73  TRIG 68  CHOLHDL 3.5   Thyroid Function Tests Recent Labs    08/18/21 2123  TSH 2.079   _____________  DG Chest 2 View  Result Date: 08/18/2021 CLINICAL DATA:  Chest pain EXAM: CHEST - 2 VIEW COMPARISON:  CT chest 03/26/2021, chest x-ray 05/27/2021, chest x-ray 07/26/2019 FINDINGS: The heart and mediastinal contours are unchanged. Aortic valve replacement. Aortic calcification. Coronary artery calcification and possible stent. Cardiac surgical changes overlie the mediastinum.  Redemonstration of an approximately 2 cm left lower lobe nodular-like density. No focal consolidation. No pulmonary edema. No pleural effusion. No pneumothorax. No acute osseous abnormality. IMPRESSION: Redemonstration of an approximately 2 cm left lower lobe nodular-like density. Recommend CT chest with intravenous contrast for further evaluation. Electronically Signed   By: Iven Finn M.D.   On: 08/18/2021 15:43   CARDIAC CATHETERIZATION  Result Date: 08/19/2021   LM lesion is 25% stenosed.   Ost LM to Mid LM lesion is 50% stenosed.   Mid LAD lesion is 80% stenosed.   Mid Cx to Dist Cx lesion is 80% stenosed.   Ost RCA to Prox RCA lesion is 100% stenosed.   Origin lesion is 40% stenosed.   RPDA lesion is 30% stenosed.   SVG to OM1 is patent. Dist Graft to Insertion lesion is 95% stenosed.   Balloon angioplasty was performed using a BALLN [place] EMERGE MR 3.5X12.   Post intervention, there is a 30% residual stenosis.   LIMA to LAD is patent   SVG to PDA is patent   SVG to OM2 is patent Severe triple vessel CAD s/p 4V CABG with 4/4 patent grafts Unable to selectively engage the left main artery due to obstruction of the ostium by the TAVR valve stent cage. Based on non-selective angiography the LAD and Circumflex are patent with anatomy likely unchanged from cath in 2022 but cannot definitively say this. All three grafts to the left system are patent. Chronic occlusion proximal RCA Patent LIMA to LAD, SVG to OM1, SVG to OM2 and SVG to PDA Severe restenosis in the distal body of SVG to OM1 in the previously stented segment Successful PTCA with balloon angioplasty only of the restenosis within the distal body of the vein graft stented segment Recommendations: I would plan to d/c home tomorrow with plans for Plavix and Eliquis for 6 months if possible. Stop ASA before discharge.   Disposition   Pt is being discharged home today in good condition.  Follow-up Plans & Appointments     Follow-up Information      Darreld Mclean, PA-C Follow up on 09/15/2021.   Specialties: Librarian, academic, Cardiology  Why: _0 :05 for hospital follow up Contact information: [address] [place] [address] [phone]                Discharge Instructions     Amb Referral to Cardiac Rehabilitation   Complete by: As directed    Diagnosis:  PTCA NSTEMI     After initial evaluation and assessments completed: Virtual Based Care may be provided alone or in conjunction with Phase 2 Cardiac Rehab based on patient barriers.: Yes   Diet - low sodium heart healthy   Complete by: As directed    Discharge instructions   Complete by: As directed    No driving for 48 hours. No lifting over 5 lbs for 1 week. No sexual activity for 1 week. Keep procedure site clean & dry. If you notice increased pain, swelling, bleeding or pus, call/return!  You may shower, but no soaking baths/hot tubs/pools for 1 week.   Increase activity slowly   Complete by: As directed        Discharge Medications   Allergies as of 08/20/2021       Reactions   Codeine Other (See Comments)   Makes him feel goofy   Morphine Other (See Comments)   Nightmares and felt bad        Medication List     TAKE these medications    albuterol 108 (90 Base) MCG/ACT inhaler Commonly known as: VENTOLIN HFA Inhale 2 puffs into the lungs every 6 (six) hours as needed. What changed: reasons to take this   amLODipine 5 MG tablet Commonly known as: NORVASC TAKE 1 TABLET BY MOUTH EVERY DAY   apixaban 2.5 MG Tabs tablet Commonly known as: Eliquis Take 1 tablet (2.5 mg total) by mouth 2 (two) times daily.   blood glucose meter kit and supplies Kit Dispense based on patient and insurance preference. Use up to four times daily as directed. (FOR ICD-9 250.00, 250.01).   budesonide-formoterol 160-4.5 MCG/ACT inhaler Commonly known as: Symbicort Inhale 2 puffs into the lungs in the morning and at bedtime. What  changed:  when to take this reasons to take this   carvedilol 6.25 MG tablet Commonly known as: COREG TAKE 1 TABLET (6.25 MG TOTAL) BY MOUTH 2 (TWO) TIMES DAILY WITH A MEAL.   clopidogrel 75 MG tablet Commonly known as: PLAVIX Take 1 tablet (75 mg total) by mouth daily with breakfast. Start taking on: August 21, 2021   docusate sodium 100 MG capsule Commonly known as: Stool Softener Take 1 capsule (100 mg total) by mouth 2 (two) times daily as needed for mild constipation.   empagliflozin 10 MG Tabs tablet Commonly known as: Jardiance Take 1 tablet (10 mg total) by mouth daily before breakfast.   FLUoxetine 10 MG capsule Commonly known as: PROZAC TAKE 1 CAPSULE BY MOUTH EVERY DAY   furosemide 40 MG tablet Commonly known as: LASIX Take 1 tablet (40 mg total) by mouth daily. What changed: when to take this   hydrocortisone 2.5 % cream Apply 1 application topically daily as needed (facial breakouts).   ICAPS AREDS 2 PO Take 1 capsule by mouth daily.   Januvia 50 MG tablet Generic drug: sitaGLIPtin TAKE 1 TABLET BY MOUTH EVERY DAY   LUBRICATING EYE DROPS OP Place 1 drop into both eyes daily as needed (dry eyes).   nitroGLYCERIN 0.4 MG SL tablet Commonly known as: NITROSTAT PLACE 1 TABLET UNDER THE TONGUE EVERY 5 MINUTES X 3 DOSES AS NEEDED FOR CHEST  PAIN   pantoprazole 40 MG tablet Commonly known as: PROTONIX TAKE 1 TABLET BY MOUTH EVERY DAY   rosuvastatin 20 MG tablet Commonly known as: CRESTOR TAKE 1 TABLET BY MOUTH EVERY DAY   spironolactone 25 MG tablet Commonly known as: ALDACTONE Take 1 tablet (25 mg total) by mouth daily.   THERAWORX RELIEF EX Apply 1 application topically daily as needed (knee pain).   VITAMIN B-12 PO Take 1 tablet by mouth daily.           Outstanding Labs/Studies   None  Duration of Discharge Encounter   Greater than 30 minutes including physician time.  Jarrett Soho, PA 08/20/2021, 10:29 AM

## 2021-08-24 ENCOUNTER — Telehealth (HOSPITAL_COMMUNITY): Payer: Self-pay

## 2021-08-24 NOTE — Telephone Encounter (Signed)
Per phase I cardiac rehab, dont call pt. Closed referral.

## 2021-08-26 ENCOUNTER — Ambulatory Visit: Payer: Medicare PPO | Admitting: Family Medicine

## 2021-08-26 ENCOUNTER — Other Ambulatory Visit: Payer: Self-pay

## 2021-08-26 VITALS — BP 122/60 | HR 80 | Temp 97.6°F | Wt 157.2 lb

## 2021-08-26 DIAGNOSIS — R911 Solitary pulmonary nodule: Secondary | ICD-10-CM

## 2021-08-26 DIAGNOSIS — E1121 Type 2 diabetes mellitus with diabetic nephropathy: Secondary | ICD-10-CM

## 2021-08-26 DIAGNOSIS — R634 Abnormal weight loss: Secondary | ICD-10-CM | POA: Diagnosis not present

## 2021-08-26 DIAGNOSIS — R1310 Dysphagia, unspecified: Secondary | ICD-10-CM | POA: Diagnosis not present

## 2021-08-26 LAB — POCT GLYCOSYLATED HEMOGLOBIN (HGB A1C): Hemoglobin A1C: 8.5 % — AB (ref 4.0–5.6)

## 2021-08-26 NOTE — Progress Notes (Signed)
Established Patient Office Visit  Subjective:  Patient ID: [name], male    DOB: 1933/06/29  Age: 87 y.o. MRN: [id number]  CC:  Chief Complaint  Patient presents with   Hospitalization Follow-up    Concerned with rapid weight loss. 19 lbs down in 2 weeks    HPI [name] presents for recent hospital follow-up.  He had recent admission for NSTEMI.  His past medical history is significant for CAD, history of aortic stenosis, atrial fibrillation, type 2 diabetes, hypertension, chronic systolic heart failure, chronic diastolic heart failure, peripheral vascular disease, COPD, history of prostate cancer, chronic kidney disease, history of TAVR, hyperlipidemia  Recent discharge summary from 08-20-2021 reviewed.  Major concern for outpatient follow-up 19 pound unintentional weight loss past couple weeks.  He states his appetite is fair.  This is slightly diminished but he is having some dysphagia mostly solids but to some extent liquids.  He reportedly had esophageal stricture years ago.  Recent thyroid functions normal.  We are looking over imaging and he had chest x-ray on 08-18-2021 which showed approximately 2 cm left lower lobe nodule like density with recommended CT chest with IV contrast for further evaluation.  Patient denies any abdominal pain.  He has been trying some boost for nutritional supplement.  Denies any nausea, vomiting, or diarrhea.  No localizing abdominal pain.  Quit smoking about 60 years ago after about 5 to 6-year history.  Denies any recent cough.  No hemoptysis.  Does have type 2 diabetes.  Recently started on Jardiance.  Last A1c was 7.7%.  Past Medical History:  Diagnosis Date   Aortic stenosis, moderate 09/20/2020   Arthritis    CAD (coronary artery disease)    a. Cath September 2015 LIMA to the LAD patent, SVG to PDA patent, SVG to posterior lateral patent, SVG to OM with a 90% in-stent restenosis at an anastomotic lesion. This was treated with angioplasty.  b. cath 04/01/2015 95% ISR in SVG to OM treated with 2.75x24 Synergy DES postdilated to 3.30m, all other grafts patent   Cataract    surgery,B/L   CHF (congestive heart failure) (HCC)    Chronic kidney disease    nephrolithiasis   Diabetes mellitus    TYPE 2   GERD (gastroesophageal reflux disease)    Hernia    Hypercholesterolemia    Hypertension    Incontinence    hx  over 1 year,leaks without awareness   Other and unspecified diseases of appendix    PAF (paroxysmal atrial fibrillation) ([facility]    in the setting of ischemia on 03/31/2015   Pancreatitis    [place]state [place] 03/[place]   prostate bx=Adenocarcinoam,gleason 3+4=7,PSA=6.75   Shortness of breath dyspnea    Ulcer    hx gastric    Past Surgical History:  Procedure Laterality Date   APPENDECTOMY     BACK SURGERY     CARDIAC CATHETERIZATION N/A 04/01/2015   Procedure: Left Heart Cath and Cors/Grafts Angiography;  Surgeon: JJettie Booze MD;  [facility];  Service: Cardiovascular;  Laterality: N/A;   CARDIAC CATHETERIZATION N/A 04/01/2015   Procedure: Coronary Stent Intervention;  Surgeon: JJettie Booze MD;  [facility];  Service: Cardiovascular;  Laterality: N/A;   CARDIOVERSION N/A 11/01/2019   Procedure: CARDIOVERSION;  Surgeon: OGeralynn Rile MD;  [facility]  Service: Cardiovascular;  Laterality: N/A;   CARPAL TUNNEL RELEASE  2004   both hands   CORONARY ARTERY BYPASS  GRAFT     CORONARY BALLOON ANGIOPLASTY N/A 08/19/2021   Procedure: CORONARY BALLOON ANGIOPLASTY;  Surgeon: Burnell Blanks, MD;  Location: [facility];  Service: Cardiovascular;  Laterality: N/A;   CORONARY STENT INTERVENTION N/A 09/23/2020   Procedure: CORONARY STENT INTERVENTION;  Surgeon: Sherren Mocha, MD;  Location: [facility];  Service: Cardiovascular;  Laterality: N/A;   CYSTOSCOPY  08/23/12   incomplete emoptying bladder   HERNIA REPAIR     incision and  drainage of right chest abscess     INTRAOPERATIVE TRANSTHORACIC ECHOCARDIOGRAM Left 04/28/2021   Procedure: INTRAOPERATIVE TRANSTHORACIC ECHOCARDIOGRAM;  Surgeon: Burnell Blanks, MD;  Location: [facility];  Service: Open Heart Surgery;  Laterality: Left;   LAPAROSCOPIC CHOLECYSTECTOMY  09/2009   LEFT HEART CATH AND CORS/GRAFTS ANGIOGRAPHY N/A 08/19/2021   Procedure: LEFT HEART CATH AND CORS/GRAFTS ANGIOGRAPHY;  Surgeon: Burnell Blanks, MD;  Location: [facility];  Service: Cardiovascular;  Laterality: N/A;   LEFT HEART CATHETERIZATION WITH CORONARY ANGIOGRAM N/A 07/19/2014   Procedure: LEFT HEART CATHETERIZATION WITH CORONARY ANGIOGRAM;  Surgeon: Leonie Man, MD;  Location: [facility] C[facility];  Service: Cardiovascular;  Laterality: N/A;   RECTAL SURGERY     RIGHT/LEFT HEART CATH AND CORONARY/GRAFT ANGIOGRAPHY N/A 09/23/2020   Procedure: RIGHT/LEFT HEART CATH AND CORONARY/GRAFT ANGIOGRAPHY;  Surgeon: Sherren Mocha, MD;  Location: [place] [facility];  Service: Cardiovascular;  Laterality: N/A;   ROBOT ASSISTED LAPAROSCOPIC RADICAL PROSTATECTOMY  2005   TRANSCATHETER AORTIC VALVE REPLACEMENT, TRANSFEMORAL Bilateral 04/28/2021   Procedure: TRANSCATHETER AORTIC VALVE REPLACEMENT, TRANSFEMORAL;  Surgeon: Burnell Blanks, MD;  Location: [place];  Service: Open Heart Surgery;  Laterality: Bilateral;   ULTRASOUND GUIDANCE FOR VASCULAR ACCESS Bilateral 04/28/2021   Procedure: ULTRASOUND GUIDANCE FOR VASCULAR ACCESS;  Surgeon: Burnell Blanks, MD;  Location: [place];  Service: Open Heart Surgery;  Laterality: Bilateral;    Family History  Problem Relation Age of Onset   Cancer Mother        stomach   Heart attack Father     Social History   Socioeconomic History   Marital status: Married    Spouse name: Not on file   Number of children: 2   Years of education: Not on file   Highest education level: Not on file  Occupational History    Employer: RETIRED    Comment:  Retired  Tobacco Use   Smoking status: Former    Packs/day: 0.50    Years: 5.00    Pack years: 2.50    Types: Cigarettes    Quit date: 07/20/1972    Years since quitting: 49.1   Smokeless tobacco: Never   Tobacco comments:    quit s  Vaping Use   Vaping Use: Never used  Substance and Sexual Activity   Alcohol use: No   Drug use: No    Comment: quit smoking 40 years ago   Sexual activity: Never  Other Topics Concern   Not on file  Social History Narrative   Retired   Married      Social Determinants of Radio broadcast assistant Strain: Low Risk    Difficulty of Paying Living Expenses: Not hard at all  Food Insecurity: No Food Insecurity   Worried About Charity fundraiser in the Last Year: Never true   Arboriculturist in the Last Year: Never true  Transportation Needs: No Transportation Needs   Lack of Transportation (Medical): No   Lack of  Transportation (Non-Medical): No  Physical Activity: Insufficiently Active   Days of Exercise per Week: 3 days   Minutes of Exercise per Session: 30 min  Stress: No Stress Concern Present   Feeling of Stress : Not at all  Social Connections: Socially Integrated   Frequency of Communication with Friends and Family: More than three times a week   Frequency of Social Gatherings with Friends and Family: More than three times a week   Attends Religious Services: More than 4 times per year   Active Member of [employer] or Organizations: Yes   Attends Archivist Meetings: 1 to 4 times per year   Marital Status: Married  Human resources officer Violence: Not At Risk   Fear of Current or Ex-Partner: No   Emotionally Abused: No   Physically Abused: No   Sexually Abused: No    Outpatient Medications Prior to Visit  Medication Sig Dispense Refill   albuterol (VENTOLIN HFA) 108 (90 Base) MCG/ACT inhaler Inhale 2 puffs into the lungs every 6 (six) hours as needed. (Patient taking differently: Inhale 2 puffs into the lungs every 6 (six)  hours as needed for wheezing or shortness of breath.) 18 g 5   amLODipine (NORVASC) 5 MG tablet TAKE 1 TABLET BY MOUTH EVERY DAY 90 tablet 3   apixaban (ELIQUIS) 2.5 MG TABS tablet Take 1 tablet (2.5 mg total) by mouth 2 (two) times daily. 180 tablet 1   blood glucose meter kit and supplies KIT Dispense based on patient and insurance preference. Use up to four times daily as directed. (FOR ICD-9 250.00, 250.01). 1 each 0   budesonide-formoterol (SYMBICORT) 160-4.5 MCG/ACT inhaler Inhale 2 puffs into the lungs in the morning and at bedtime. (Patient taking differently: Inhale 2 puffs into the lungs 2 (two) times daily as needed (sob/wheezing).) 1 each 12   Carboxymethylcellul-Glycerin (LUBRICATING EYE DROPS OP) Place 1 drop into both eyes daily as needed (dry eyes).     carvedilol (COREG) 6.25 MG tablet TAKE 1 TABLET (6.25 MG TOTAL) BY MOUTH 2 (TWO) TIMES DAILY WITH A MEAL. 180 tablet 3   clopidogrel (PLAVIX) 75 MG tablet Take 1 tablet (75 mg total) by mouth daily with breakfast. 90 tablet 2   Cyanocobalamin (VITAMIN B-12 PO) Take 1 tablet by mouth daily.     docusate sodium (STOOL SOFTENER) 100 MG capsule Take 1 capsule (100 mg total) by mouth 2 (two) times daily as needed for mild constipation. 30 capsule 0   empagliflozin (JARDIANCE) 10 MG TABS tablet Take 1 tablet (10 mg total) by mouth daily before breakfast. 30 tablet 3   FLUoxetine (PROZAC) 10 MG capsule TAKE 1 CAPSULE BY MOUTH EVERY DAY 90 capsule 2   furosemide (LASIX) 40 MG tablet Take 1 tablet (40 mg total) by mouth daily. 90 tablet 3   Homeopathic Products (THERAWORX RELIEF EX) Apply 1 application topically daily as needed (knee pain).     hydrocortisone 2.5 % cream Apply 1 application topically daily as needed (facial breakouts).     JANUVIA 50 MG tablet TAKE 1 TABLET BY MOUTH EVERY DAY 90 tablet 1   Multiple Vitamins-Minerals (ICAPS AREDS 2 PO) Take 1 capsule by mouth daily.     nitroGLYCERIN (NITROSTAT) 0.4 MG SL tablet PLACE 1 TABLET  UNDER THE TONGUE EVERY 5 MINUTES X 3 DOSES AS NEEDED FOR CHEST PAIN 75 tablet 1   pantoprazole (PROTONIX) 40 MG tablet TAKE 1 TABLET BY MOUTH EVERY DAY 90 tablet 1   rosuvastatin (CRESTOR) 20 MG  tablet TAKE 1 TABLET BY MOUTH EVERY DAY 90 tablet 0   spironolactone (ALDACTONE) 25 MG tablet Take 1 tablet (25 mg total) by mouth daily. 90 tablet 3   No facility-administered medications prior to visit.    Allergies  Allergen Reactions   Codeine Other (See Comments)    Makes him feel goofy   Morphine Other (See Comments)    Nightmares and felt bad    ROS Review of Systems  Constitutional:  Positive for unexpected weight change. Negative for chills and fever.  HENT:  Positive for trouble swallowing.   Respiratory:  Positive for shortness of breath. Negative for cough.   Cardiovascular:  Negative for chest pain, palpitations and leg swelling.  Gastrointestinal:  Negative for abdominal pain, blood in stool, diarrhea, nausea and vomiting.  Genitourinary:  Negative for dysuria.  Neurological:  Negative for headaches.  Hematological:  Negative for adenopathy.     Objective:    Physical Exam Vitals reviewed.  Cardiovascular:     Rate and Rhythm: Normal rate.  Pulmonary:     Effort: Pulmonary effort is normal.     Breath sounds: No wheezing.  Abdominal:     Palpations: Abdomen is soft.     Tenderness: There is no abdominal tenderness.  Musculoskeletal:     Cervical back: Neck supple.     Right lower leg: No edema.     Left lower leg: No edema.  Lymphadenopathy:     Cervical: No cervical adenopathy.  Neurological:     Mental Status: He is alert.    BP 122/60 (BP Location: Left Arm, Patient Position: Sitting, Cuff Size: Normal)   Pulse 80   Temp 97.6 F (36.4 C) (Oral)   Wt 157 lb 3.2 oz (71.3 kg)   SpO2 99%   BMI 23.21 kg/m  Wt Readings from Last 3 Encounters:  08/26/21 157 lb 3.2 oz (71.3 kg)  08/20/21 155 lb 8 oz (70.5 kg)  06/11/21 171 lb 9.6 oz (77.8 kg)      Health Maintenance Due  Topic Date Due   Zoster Vaccines- Shingrix (1 of 2) Never done   FOOT EXAM  02/08/2018   COVID-19 Vaccine (3 - Pfizer risk series) 01/12/2020   URINE MICROALBUMIN  07/29/2021    There are no preventive care reminders to display for this patient.  Lab Results  Component Value Date   TSH 2.079 08/18/2021   Lab Results  Component Value Date   WBC 12.9 (H) 08/20/2021   HGB 13.5 08/20/2021   HCT 41.1 08/20/2021   MCV 79.2 (L) 08/20/2021   PLT 123 (L) 08/20/2021   Lab Results  Component Value Date   NA 133 (L) 08/20/2021   K 4.6 08/20/2021   CO2 20 (L) 08/20/2021   GLUCOSE 100 (H) 08/20/2021   BUN 23 08/20/2021   CREATININE 1.46 (H) 08/20/2021   BILITOT 1.5 (H) 04/27/2021   ALKPHOS 97 04/27/2021   AST 14 (L) 04/27/2021   ALT 19 04/27/2021   PROT 6.8 04/27/2021   ALBUMIN 3.4 (L) 04/27/2021   CALCIUM 9.6 08/20/2021   ANIONGAP 8 08/20/2021   EGFR 28 (L) 07/23/2021   GFR 32.30 (L) 01/14/2020   Lab Results  Component Value Date   CHOL 122 08/19/2021   Lab Results  Component Value Date   HDL 35 (L) 08/19/2021   Lab Results  Component Value Date   LDLCALC 73 08/19/2021   Lab Results  Component Value Date   TRIG 68 08/19/2021   Lab  Results  Component Value Date   CHOLHDL 3.5 08/19/2021   Lab Results  Component Value Date   HGBA1C 8.5 (A) 08/26/2021      Assessment & Plan:   Problem List Items Addressed This Visit       Unprioritized   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  Presents now with unintentional weight loss of about 19 pounds over the past couple weeks.  2 cm nodular density left lower lobe noted on recent chest x-ray.  Patient also relates some dysphagia past few weeks to solids and to a lesser extent liquids.  He also has type 2 diabetes suboptimally controlled with A1c 8.5%.  Recent initiation of Jardiance.  Difficult to sort out how much of this is related to his dysphagia, poorly controlled diabetes, versus other.  Needs further evaluation.  Recommend the following  -Continue to monitor blood sugars closely.  With his very poor appetite would be careful not to get too aggressive with medication such as insulin at this time.  Monitor fastings and if consistently getting over 200 we may need to add once daily long-acting insulin  -CT chest with IV contrast to evaluate 2 cm left lower lobe nodule  -CT abdomen pelvis  -Barium swallow study to evaluate his dysphagia.  -We suggested that he switch from Boost to Glucerna to reduce glucose content  -2-week office follow-up  Over 40 minutes spent evaluating and reviewing recent hospitalization and discussing his recent weight loss and formulating a plan of action to investigate  No orders of the defined types were placed in this encounter.   Follow-up: Return in about 2 weeks (around 09/09/2021).    Carolann Littler, MD

## 2021-08-26 NOTE — Patient Instructions (Signed)
I will set up CT chest (to evaluate lung nodule), CT abdomen and pelvis, and barium swallow study to evaluate  swallowing difficulty.

## 2021-09-02 ENCOUNTER — Other Ambulatory Visit: Payer: Medicare PPO

## 2021-09-02 ENCOUNTER — Other Ambulatory Visit: Payer: Self-pay

## 2021-09-02 ENCOUNTER — Ambulatory Visit
Admission: RE | Admit: 2021-09-02 | Discharge: 2021-09-02 | Disposition: A | Discharge status: Home / Self Care | Payer: Medicare PPO | Source: Ambulatory Visit | Attending: Family Medicine | Admitting: Family Medicine

## 2021-09-02 DIAGNOSIS — R911 Solitary pulmonary nodule: Secondary | ICD-10-CM

## 2021-09-02 DIAGNOSIS — I517 Cardiomegaly: Secondary | ICD-10-CM | POA: Diagnosis not present

## 2021-09-02 DIAGNOSIS — R634 Abnormal weight loss: Secondary | ICD-10-CM | POA: Diagnosis not present

## 2021-09-02 DIAGNOSIS — D3502 Benign neoplasm of left adrenal gland: Secondary | ICD-10-CM | POA: Diagnosis not present

## 2021-09-02 DIAGNOSIS — I700 Atherosclerosis of aorta: Secondary | ICD-10-CM | POA: Diagnosis not present

## 2021-09-02 MED ORDER — IOPAMIDOL (ISOVUE-300) INJECTION 61%
100.0000 mL | Freq: Once | INTRAVENOUS | Status: AC | PRN
  Administered 2021-09-02: 100 mL via INTRAVENOUS

## 2021-09-03 ENCOUNTER — Telehealth: Payer: Self-pay | Admitting: Physician Assistant

## 2021-09-03 NOTE — Telephone Encounter (Signed)
Patient's wife (DPR) wants Dr. Percival Spanish to advise on if patient can discontinue taking Januvia. Patient complaining of feeling weak and having trouble swallowing food at times. Patient would like to stop taking medication. Patient's wife stated they called his PCP and he referred them to talk to their cardiologist. Will forward to Dr. Percival Spanish for advisement.

## 2021-09-03 NOTE — Telephone Encounter (Signed)
Pt c/o medication issue:  1. Name of Medication:  empagliflozin (JARDIANCE) 10 MG TABS tablet  2. How are you currently taking this medication (dosage and times per day)? 1 tablet by mouth daily at breakfast   3. Are you having a reaction (difficulty breathing--STAT)? Yes   4. What is your medication issue? Brydon's wife is calling stating Cash has lost 20 lbs since being on this medication, he is also weaker, and is having issues swallowing his food at times. They are wanting to know if he can stop this medication.

## 2021-09-09 ENCOUNTER — Other Ambulatory Visit: Payer: Self-pay | Admitting: Student

## 2021-09-09 ENCOUNTER — Ambulatory Visit: Payer: Medicare PPO | Admitting: Family Medicine

## 2021-09-09 VITALS — BP 108/58 | HR 70 | Temp 98.4°F | Wt 154.5 lb

## 2021-09-09 DIAGNOSIS — R634 Abnormal weight loss: Secondary | ICD-10-CM | POA: Diagnosis not present

## 2021-09-09 DIAGNOSIS — F339 Major depressive disorder, recurrent, unspecified: Secondary | ICD-10-CM | POA: Diagnosis not present

## 2021-09-09 MED ORDER — SERTRALINE HCL 50 MG PO TABS: 50.0000 mg | ORAL_TABLET | Freq: Every day | ORAL | 1 refills | Status: DC

## 2021-09-09 NOTE — Progress Notes (Signed)
 Established Patient Office Visit  Subjective:  Patient ID: [name], male    DOB: 10/16/1933  Age: 88 y.o. MRN: [id number]  CC:  Chief Complaint  Patient presents with   Follow-up    HPI [name] presents for follow-up regarding severe weight loss and fatigue.  Refer to recent note from 08/26/2021 for details regarding recent hospitalization:  Effrey E Grizzell presents for recent hospital follow-up.  He had recent admission for NSTEMI.  His past medical history is significant for CAD, history of aortic stenosis, atrial fibrillation, type 2 diabetes, hypertension, chronic systolic heart failure, chronic diastolic heart failure, peripheral vascular disease, COPD, history of prostate cancer, chronic kidney disease, history of TAVR, hyperlipidemia   Recent discharge summary from 08-20-2021 reviewed.  Major concern for outpatient follow-up 19 pound unintentional weight loss past couple weeks.  He states his appetite is fair.  This is slightly diminished but he is having some dysphagia mostly solids but to some extent liquids.  He reportedly had esophageal stricture years ago.  Recent thyroid functions normal.   We are looking over imaging and he had chest x-ray on 08-18-2021 which showed approximately 2 cm left lower lobe nodule like density with recommended CT chest with IV contrast for further evaluation.  Patient denies any abdominal pain.  He has been trying some boost for nutritional supplement.  Denies any nausea, vomiting, or diarrhea.  No localizing abdominal pain.  Quit smoking about 60 years ago after about 5 to 6-year history.  Denies any recent cough.  No hemoptysis.  We obtained CT chest which did not show any concerning nodules or lung mass.  CT abdomen pelvis did not show any acute findings.  Stable left adrenal adenoma.  Patient has some continued substernal pains with swallowing but no severe dysphagia.  Barium swallow study pending for next Wednesday.  He does take Protonix 40  mg daily.  He had called cardiology with concerns about possible side effects from Jardiance.  Apparently there was some confusion and they thought he was calling about Januvia.  His wife had looked up side effects from Jardiance and they are fairly convinced some of his side effects may be related to that.  He stopped Jardiance a few days ago.  He states today that his greatest concern is depression.  He is currently on Prozac 10 mg daily.  Has had history of recurrent depression in the past.  He states his symptoms are severe at this time.  PHQ-9 equals 22 today.  No active suicidal ideation.  He has had some fleeting thoughts.  Past Medical History:  Diagnosis Date   Aortic stenosis, moderate 09/20/2020   Arthritis    CAD (coronary artery disease)    a. Cath September 2015 LIMA to the LAD patent, SVG to PDA patent, SVG to posterior lateral patent, SVG to OM with a 90% in-stent restenosis at an anastomotic lesion. This was treated with angioplasty. b. cath 04/01/2015 95% ISR in SVG to OM treated with 2.75x24 Synergy DES postdilated to 3.3mm, all other grafts patent   Cataract    surgery,B/L   CHF (congestive heart failure) (HCC)    Chronic kidney disease    nephrolithiasis   Diabetes mellitus    TYPE 2   GERD (gastroesophageal reflux disease)    Hernia    Hypercholesterolemia    Hypertension    Incontinence    hx  over 1 year,leaks without awareness   Other and unspecified diseases of appendix      PAF (paroxysmal atrial fibrillation) (HCC)    in the setting of ischemia on 03/31/2015   Pancreatitis    Prostate CA (HCC) 03/01/04   prostate bx=Adenocarcinoam,gleason 3+4=7,PSA=6.75   Shortness of breath dyspnea    Ulcer    hx gastric    Past Surgical History:  Procedure Laterality Date   APPENDECTOMY     BACK SURGERY     CARDIAC CATHETERIZATION N/A 04/01/2015   Procedure: Left Heart Cath and Cors/Grafts Angiography;  Surgeon: Jayadeep S Varanasi, MD;  Location: [facility];   Service: Cardiovascular;  Laterality: N/A;   CARDIAC CATHETERIZATION N/A 04/01/2015   Procedure: Coronary Stent Intervention;  Surgeon: Jayadeep S Varanasi, MD;  Location: [facility];  Service: Cardiovascular;  Laterality: N/A;   CARDIOVERSION N/A 11/01/2019   Procedure: CARDIOVERSION;  Surgeon: O'Neal, Marlow Thomas, MD;  Location: [facility];  Service: Cardiovascular;  Laterality: N/A;   CARPAL TUNNEL RELEASE  2004   both hands   CORONARY ARTERY BYPASS GRAFT     CORONARY BALLOON ANGIOPLASTY N/A 08/19/2021   Procedure: CORONARY BALLOON ANGIOPLASTY;  Surgeon: McAlhany, Christopher D, MD;  Location: [facility];  Service: Cardiovascular;  Laterality: N/A;   CORONARY STENT INTERVENTION N/A 09/23/2020   Procedure: CORONARY STENT INTERVENTION;  Surgeon: Cooper, Michael, MD;  Location: [facility];  Service: Cardiovascular;  Laterality: N/A;   CYSTOSCOPY  08/23/12   incomplete emoptying bladder   HERNIA REPAIR     incision and drainage of right chest abscess     INTRAOPERATIVE TRANSTHORACIC ECHOCARDIOGRAM Left 04/28/2021   Procedure: INTRAOPERATIVE TRANSTHORACIC ECHOCARDIOGRAM;  Surgeon: McAlhany, Christopher D, MD;  Location: [facility];  Service: Open Heart Surgery;  Laterality: Left;   LAPAROSCOPIC CHOLECYSTECTOMY  09/2009   LEFT HEART CATH AND CORS/GRAFTS ANGIOGRAPHY N/A 08/19/2021   Procedure: LEFT HEART CATH AND CORS/GRAFTS ANGIOGRAPHY;  Surgeon: McAlhany, Christopher D, MD;  Location: [facility];  Service: Cardiovascular;  Laterality: N/A;   LEFT HEART CATHETERIZATION WITH CORONARY ANGIOGRAM N/A 07/19/2014   Procedure: LEFT HEART CATHETERIZATION WITH CORONARY ANGIOGRAM;  Surgeon: David W Harding, MD;  Location: [facility];  Service: Cardiovascular;  Laterality: N/A;   RECTAL SURGERY     RIGHT/LEFT HEART CATH AND CORONARY/GRAFT ANGIOGRAPHY N/A 09/23/2020   Procedure: RIGHT/LEFT HEART CATH AND CORONARY/GRAFT ANGIOGRAPHY;  Surgeon: Cooper, Michael, MD;  [facility]: [facility];  Service: Cardiovascular;  Laterality: N/A;   ROBOT ASSISTED LAPAROSCOPIC RADICAL PROSTATECTOMY  2005   TRANSCATHETER AORTIC VALVE REPLACEMENT, TRANSFEMORAL Bilateral 04/28/2021   Procedure: TRANSCATHETER AORTIC VALVE REPLACEMENT, TRANSFEMORAL;  Surgeon: McAlhany, Christopher D, MD;  Location: [facility];  Service: Open Heart Surgery;  Laterality: Bilateral;   ULTRASOUND GUIDANCE FOR VASCULAR ACCESS Bilateral 04/28/2021   Procedure: ULTRASOUND GUIDANCE FOR VASCULAR ACCESS;  Surgeon: McAlhany, Christopher D, MD;  Location: [facility];  Service: Open Heart Surgery;  Laterality: Bilateral;    Family History  Problem Relation Age of Onset   Cancer Mother        stomach   Heart attack Father     Social History   Socioeconomic History   Marital status: Married    Spouse name: Not on file   Number of children: 2   Years of education: Not on file   Highest education level: Not on file  Occupational History    Employer: RETIRED    Comment: Retired  Tobacco Use   Smoking status: Former    Packs/day: 0.50    Years: 5.00      Pack years: 2.50    Types: Cigarettes    Quit date: 07/20/1972    Years since quitting: 49.1   Smokeless tobacco: Never   Tobacco comments:    quit s  Vaping Use   Vaping Use: Never used  Substance and Sexual Activity   Alcohol use: No   Drug use: No    Comment: quit smoking 40 years ago   Sexual activity: Never  Other Topics Concern   Not on file  Social History Narrative   Retired   Married      Social Determinants of Health   Financial Resource Strain: Low Risk    Difficulty of Paying Living Expenses: Not hard at all  Food Insecurity: No Food Insecurity   Worried About Running Out of Food in the Last Year: Never true   Ran Out of Food in the Last Year: Never true  Transportation Needs: No Transportation Needs   Lack of Transportation (Medical): No   Lack of Transportation (Non-Medical): No  Physical Activity: Insufficiently Active   Days of  Exercise per Week: 3 days   Minutes of Exercise per Session: 30 min  Stress: No Stress Concern Present   Feeling of Stress : Not at all  Social Connections: Socially Integrated   Frequency of Communication with Friends and Family: More than three times a week   Frequency of Social Gatherings with Friends and Family: More than three times a week   Attends Religious Services: More than 4 times per year   Active Member of Clubs or Organizations: Yes   Attends Club or Organization Meetings: 1 to 4 times per year   Marital Status: Married  Intimate Partner Violence: Not At Risk   Fear of Current or Ex-Partner: No   Emotionally Abused: No   Physically Abused: No   Sexually Abused: No    Outpatient Medications Prior to Visit  Medication Sig Dispense Refill   albuterol (VENTOLIN HFA) 108 (90 Base) MCG/ACT inhaler Inhale 2 puffs into the lungs every 6 (six) hours as needed. (Patient taking differently: Inhale 2 puffs into the lungs every 6 (six) hours as needed for wheezing or shortness of breath.) 18 g 5   amLODipine (NORVASC) 5 MG tablet TAKE 1 TABLET BY MOUTH EVERY DAY 90 tablet 3   apixaban (ELIQUIS) 2.5 MG TABS tablet Take 1 tablet (2.5 mg total) by mouth 2 (two) times daily. 180 tablet 1   blood glucose meter kit and supplies KIT Dispense based on patient and insurance preference. Use up to four times daily as directed. (FOR ICD-9 250.00, 250.01). 1 each 0   budesonide-formoterol (SYMBICORT) 160-4.5 MCG/ACT inhaler Inhale 2 puffs into the lungs in the morning and at bedtime. (Patient taking differently: Inhale 2 puffs into the lungs 2 (two) times daily as needed (sob/wheezing).) 1 each 12   Carboxymethylcellul-Glycerin (LUBRICATING EYE DROPS OP) Place 1 drop into both eyes daily as needed (dry eyes).     carvedilol (COREG) 6.25 MG tablet TAKE 1 TABLET (6.25 MG TOTAL) BY MOUTH 2 (TWO) TIMES DAILY WITH A MEAL. 180 tablet 3   clopidogrel (PLAVIX) 75 MG tablet Take 1 tablet (75 mg total) by mouth  daily with breakfast. 90 tablet 2   Cyanocobalamin (VITAMIN B-12 PO) Take 1 tablet by mouth daily.     docusate sodium (STOOL SOFTENER) 100 MG capsule Take 1 capsule (100 mg total) by mouth 2 (two) times daily as needed for mild constipation. 30 capsule 0   empagliflozin (JARDIANCE) 10 MG TABS   tablet Take 1 tablet (10 mg total) by mouth daily before breakfast. 30 tablet 3   furosemide (LASIX) 40 MG tablet Take 1 tablet (40 mg total) by mouth daily. 90 tablet 3   Homeopathic Products (THERAWORX RELIEF EX) Apply 1 application topically daily as needed (knee pain).     hydrocortisone 2.5 % cream Apply 1 application topically daily as needed (facial breakouts).     JANUVIA 50 MG tablet TAKE 1 TABLET BY MOUTH EVERY DAY 90 tablet 1   Multiple Vitamins-Minerals (ICAPS AREDS 2 PO) Take 1 capsule by mouth daily.     nitroGLYCERIN (NITROSTAT) 0.4 MG SL tablet PLACE 1 TABLET UNDER THE TONGUE EVERY 5 MINUTES X 3 DOSES AS NEEDED FOR CHEST PAIN 75 tablet 1   pantoprazole (PROTONIX) 40 MG tablet TAKE 1 TABLET BY MOUTH EVERY DAY 90 tablet 1   rosuvastatin (CRESTOR) 20 MG tablet TAKE 1 TABLET BY MOUTH EVERY DAY 90 tablet 0   spironolactone (ALDACTONE) 25 MG tablet Take 1 tablet (25 mg total) by mouth daily. 90 tablet 3   FLUoxetine (PROZAC) 10 MG capsule TAKE 1 CAPSULE BY MOUTH EVERY DAY 90 capsule 2   No facility-administered medications prior to visit.    Allergies  Allergen Reactions   Codeine Other (See Comments)    Makes him feel goofy   Morphine Other (See Comments)    Nightmares and felt bad    ROS Review of Systems  Constitutional:  Positive for appetite change, fatigue and unexpected weight change.  Respiratory:  Negative for cough.   Gastrointestinal:  Negative for abdominal pain.  Psychiatric/Behavioral:  Positive for dysphoric mood and sleep disturbance.      Objective:    Physical Exam Vitals reviewed.  Cardiovascular:     Rate and Rhythm: Normal rate.  Pulmonary:     Effort:  Pulmonary effort is normal.     Breath sounds: Normal breath sounds.  Musculoskeletal:     Right lower leg: No edema.     Left lower leg: No edema.  Psychiatric:     Comments: Depressed mood.  PHQ-9 equals 22    BP (!) 108/58 (BP Location: Left Arm, Patient Position: Sitting, Cuff Size: Normal)   Pulse 70   Temp 98.4 F (36.9 C) (Oral)   Wt 154 lb 8 oz (70.1 kg)   SpO2 98%   BMI 22.82 kg/m  Wt Readings from Last 3 Encounters:  09/09/21 154 lb 8 oz (70.1 kg)  08/26/21 157 lb 3.2 oz (71.3 kg)  08/20/21 155 lb 8 oz (70.5 kg)     Health Maintenance Due  Topic Date Due   Zoster Vaccines- Shingrix (1 of 2) Never done   FOOT EXAM  02/08/2018   COVID-19 Vaccine (3 - Pfizer risk series) 01/12/2020   URINE MICROALBUMIN  07/29/2021    There are no preventive care reminders to display for this patient.  Lab Results  Component Value Date   TSH 2.079 08/18/2021   Lab Results  Component Value Date   WBC 12.9 (H) 08/20/2021   HGB 13.5 08/20/2021   HCT 41.1 08/20/2021   MCV 79.2 (L) 08/20/2021   PLT 123 (L) 08/20/2021   Lab Results  Component Value Date   NA 133 (L) 08/20/2021   K 4.6 08/20/2021   CO2 20 (L) 08/20/2021   GLUCOSE 100 (H) 08/20/2021   BUN 23 08/20/2021   CREATININE 1.46 (H) 08/20/2021   BILITOT 1.5 (H) 04/27/2021   ALKPHOS 97 04/27/2021   AST 14 (  L) 04/27/2021   ALT 19 04/27/2021   PROT 6.8 04/27/2021   ALBUMIN 3.4 (L) 04/27/2021   CALCIUM 9.6 08/20/2021   ANIONGAP 8 08/20/2021   EGFR 28 (L) 07/23/2021   GFR 32.30 (L) 01/14/2020   Lab Results  Component Value Date   CHOL 122 08/19/2021   Lab Results  Component Value Date   HDL 35 (L) 08/19/2021   Lab Results  Component Value Date   LDLCALC 73 08/19/2021   Lab Results  Component Value Date   TRIG 68 08/19/2021   Lab Results  Component Value Date   CHOLHDL 3.5 08/19/2021   Lab Results  Component Value Date   HGBA1C 8.5 (A) 08/26/2021      Assessment & Plan:    #1 weight loss.   Additional 3 pounds weight loss since last visit.  CT chest and abdomen reassuring.  Barium swallow pending.  Patient recently stopped Jardiance on his own.  Again, this may be multifactorial.  He does have some severe depression as below and we are going to address some changes there. -We strongly recommend he try to increase his calorie intake and perhaps more frequent smaller meals -Barium swallow as above pending for next Wednesday.  If this shows any significant abnormalities consider GI referral  #2 severe depression/recurrent.  PHQ-9 equals 22.  We discussed possible titration of Prozac.  He is not interested in doing so.  He would like to consider change of therapy.  Stop Prozac.  No need to taper since this has a long half-life.  Start sertraline 25 mg daily and after 4 to 5 days if tolerating well titrate further to 50 mg and set up 3-week follow-up   Meds ordered this encounter  Medications   sertraline (ZOLOFT) 50 MG tablet    Sig: Take 1 tablet (50 mg total) by mouth daily.    Dispense:  30 tablet    Refill:  1    Follow-up: Return in about 3 weeks (around 09/30/2021).    Bruce Burchette, MD 

## 2021-09-09 NOTE — Telephone Encounter (Signed)
Please clarify dose

## 2021-09-09 NOTE — Patient Instructions (Signed)
STOP the Prozac.    Start Zoloft 50 mg- take one half tablet daily for 3-4 days and then increase to one daily if tolerating well.

## 2021-09-13 NOTE — Progress Notes (Signed)
Cardiology Office Note   Date:  09/15/2021   ID:  [name], DOB [birth year], MRN [id number]  PCP:  Shawn Post, [name]  Cardiologist:   Shawn Breeding, [name]  Chief Complaint  Patient presents with   Fatigue       History of Present Illness: [name] is a 87 y.o. male who presents for followup of CAD.   He had chest pain and had a cardiac catheterization on 04/01/2015 which showed a 95% in-stent restenosis in SVG to OM, treated with 2.75 x 24 mm Synergy DES postdilated to 3.3 mm. He had patent LIMA to LAD, patent SVG to posterolateral, patent SVG to PDA.    Of note he did have atrial fibrillation with rate control on presentation but he converted spontaneously. He was not started on warfarin because he was taking aspirin and Plavix.   He was having SOB in Dec 2018.  On [place] he had a fixed inferior defect.  CPX testing suggested a mixed restrictive/obstructive pattern of PFTs.  He was not thought to have a cardiovascular limitation.   He has been in atrial fib.  I had him wear a Holter and he had 100% atrial fib on this monitor.  He had a cardioversion but had recurrent atrial fib.  He had continued dyspnea.  I sent him to pulmonary and he was found to have moderate COPD.   The Structural Heart Team was consulted and recommended work-up for possible TAVR. Patient underwent right/left heart cath on 09/23/2020 which showed severe native vessel CAD with patent LIMA to LAD, SVG to left PLA, and SVG to right PDA grafts. He had severe in-stent restenosis of SVG to OM graft which was treated with noncompliant balloon angioplasty. Moderate AS was noted with mean gradient of 14 mmHg but a component of paradoxical low flow low gradient AS was suspected. Triple therapy with Aspirin, Plavix, and Eliquis was recommended for 1 month at which time Aspirin can be discontinued. Plavix recommended for 6 months. Lasix was reduced to 89m once daily and Spironolactone was continued as well as Coreg  and Amlodipine.  He has continued to have chronic chest pain.  He had a TAVR.   Unfortunately, he has continued to have chest pain.  He was most recently in the hospital with chest pain earlier this month.    Cardiac catheterization showed patent 3 grafts. There was severe restenosis in the distal body of SVG to OM1 in the previously stented segment s/p successful PTCA with balloon angioplasty only of the restenosis within the distal body of the vein graft stented segment.  The plan to treat with Plavix and Eliquis for 6 months.  Discontinued aspirin.   Unfortunately he has not felt well since.  He comes today in a wheelchair and says he has been getting around very slowly in the house.  It looks like he is lost about 20 pounds.  He said he is not able to swallow foods.  He has had a little slight bleeding when he wipes after a bowel movement.  He reports that he is having some right-sided chest discomfort.  He said all of this has been ongoing since before the valve and really has not changed and also is not changed since his angioplasty.  He is scheduled for barium swallow.  I went back and read [name] notes and there is a suggestion of depression as a least a compounding factor.  I do see that  he had a chest abdominal CT which was essentially unremarkable ordered to evaluate his weight loss.  Of note he did think a lot of his symptoms were related to [place].  He stopped taking this about 4 to 5 days ago.  He really has not felt any better.   Past Medical History:  Diagnosis Date   Aortic stenosis, moderate 09/20/2020   Arthritis    CAD (coronary artery disease)    a. Cath September 2015 LIMA to the LAD patent, SVG to PDA patent, SVG to posterior lateral patent, SVG to OM with a 90% in-stent restenosis at an anastomotic lesion. This was treated with angioplasty. b. cath 04/01/2015 95% ISR in SVG to OM treated with 2.75x24 Synergy DES postdilated to 3.85m, all other grafts patent    Cataract    surgery,B/L   CHF (congestive heart failure) (HCC)    Chronic kidney disease    nephrolithiasis   Diabetes mellitus    TYPE 2   GERD (gastroesophageal reflux disease)    Hernia    Hypercholesterolemia    Hypertension    Incontinence    hx  over 1 year,leaks without awareness   Other and unspecified diseases of appendix    PA[place]oxysmal [place] fibrillation) [place]    in the setting of ischemia on 03/31/2015   Pancreatitis    [place]   prostate bx=Adenocarcinoam,gleason 3+4=7,PSA=6.75   Shortness of breath dyspnea    Ulcer    hx gastric    Past Surgical History:  Procedure Laterality Date   APPENDECTOMY     BACK SURGERY     CARDIAC CATHETERIZATION N/A 04/01/2015   Procedure: Left Heart Cath and Cors/Grafts Angiography;  Surgeon: JJettie Booze [name];  [facility];  Service: Cardiovascular;  Laterality: N/A;   CARDIAC CATHETERIZATION N/A 04/01/2015   Procedure: Coronary Stent Intervention;  Surgeon: JJettie Booze [name];  [facility];  Service: Cardiovascular;  Laterality: N/A;   CARDIOVERSION N/A 11/01/2019   Procedure: CARDIOVERSION;  Surgeon: OGeralynn Rile [name];  [facility]  Service: Cardiovascular;  Laterality: N/A;   CARPAL TUNNEL RELEASE  2004   both hands   CORONARY ARTERY BYPASS GRAFT     CORONARY BALLOON ANGIOPLASTY N/A 08/19/2021   Procedure: CORONARY BALLOON ANGIOPLASTY;  Surgeon: MBurnell Blanks [name];  [facility];  Service: Cardiovascular;  Laterality: N/A;   CORONARY STENT INTERVENTION N/A 09/23/2020   Procedure: CORONARY STENT INTERVENTION;  Surgeon: CSherren Mocha [name];  [facility];  Service: Cardiovascular;  Laterality: N/A;   CYSTOSCOPY  08/23/12   incomplete emoptying bladder   HERNIA REPAIR     incision and drainage of right chest abscess     INTRAOPERATIVE TRANSTHORACIC ECHOCARDIOGRAM Left 04/28/2021   Procedure: INTRAOPERATIVE  TRANSTHORACIC ECHOCARDIOGRAM;  Surgeon: MBurnell Blanks [name];  [facility]  Service: Open Heart Surgery;  Laterality: Left;   LAPAROSCOPIC CHOLECYSTECTOMY  09/2009   LEFT HEART CATH AND CORS/GRAFTS ANGIOGRAPHY N/A 08/19/2021   Procedure: LEFT HEART CATH AND CORS/GRAFTS ANGIOGRAPHY;  Surgeon: MBurnell Blanks [name];  [facility];  Service: Cardiovascular;  Laterality: N/A;   LEFT HEART CATHETERIZATION WITH CORONARY ANGIOGRAM N/A 07/19/2014   Procedure: LEFT HEART CATHETERIZATION WITH CORONARY ANGIOGRAM;  Surgeon: DLeonie Man [name];  [facility];  Service: Cardiovascular;  Laterality: N/A;   RECTAL SURGERY     RIGHT/LEFT HEART CATH AND CORONARY/GRAFT ANGIOGRAPHY N/A 09/23/2020  Procedure: RIGHT/LEFT HEART CATH AND CORONARY/GRAFT ANGIOGRAPHY;  Surgeon: Sherren Mocha, [name];  Location: [place] [facility];  Service: Cardiovascular;  Laterality: N/A;   ROBOT ASSISTED LAPAROSCOPIC RADICAL PROSTATECTOMY  2005   TRANSCATHETER AORTIC VALVE REPLACEMENT, TRANSFEMORAL Bilateral 04/28/2021   Procedure: TRANSCATHETER AORTIC VALVE REPLACEMENT, TRANSFEMORAL;  Surgeon: Burnell Blanks, [name];  Location: [place];  Service: Open Heart Surgery;  Laterality: Bilateral;   ULTRASOUND GUIDANCE FOR VASCULAR ACCESS Bilateral 04/28/2021   Procedure: ULTRASOUND GUIDANCE FOR VASCULAR ACCESS;  Surgeon: Burnell Blanks, [name];  Location: [place];  Service: Open Heart Surgery;  Laterality: Bilateral;     Current Outpatient Medications  Medication Sig Dispense Refill   albuterol (VENTOLIN HFA) 108 (90 Base) MCG/ACT inhaler Inhale 2 puffs into the lungs every 6 (six) hours as needed. (Patient taking differently: Inhale 2 puffs into the lungs every 6 (six) hours as needed for wheezing or shortness of breath.) 18 g 5   amLODipine (NORVASC) 5 MG tablet TAKE 1 TABLET BY MOUTH EVERY DAY 90 tablet 3   apixaban (ELIQUIS) 2.5 MG TABS tablet Take 1 tablet (2.5 mg total) by mouth 2 (two) times  daily. 180 tablet 1   blood glucose meter kit and supplies KIT Dispense based on patient and insurance preference. Use up to four times daily as directed. (FOR ICD-9 250.00, 250.01). 1 each 0   budesonide-formoterol (SYMBICORT) 160-4.5 MCG/ACT inhaler Inhale 2 puffs into the lungs in the morning and at bedtime. (Patient taking differently: Inhale 2 puffs into the lungs 2 (two) times daily as needed (sob/wheezing).) 1 each 12   Carboxymethylcellul-Glycerin (LUBRICATING EYE DROPS OP) Place 1 drop into both eyes daily as needed (dry eyes).     carvedilol (COREG) 6.25 MG tablet TAKE 1 TABLET (6.25 MG TOTAL) BY MOUTH 2 (TWO) TIMES DAILY WITH A MEAL. 180 tablet 3   clopidogrel (PLAVIX) 75 MG tablet Take 1 tablet (75 mg total) by mouth daily with breakfast. 90 tablet 2   Cyanocobalamin (VITAMIN B-12 PO) Take 1 tablet by mouth daily.     docusate sodium (STOOL SOFTENER) 100 MG capsule Take 1 capsule (100 mg total) by mouth 2 (two) times daily as needed for mild constipation. 30 capsule 0   furosemide (LASIX) 40 MG tablet Take 1 tablet (40 mg total) by mouth daily. 90 tablet 2   Homeopathic Products (THERAWORX RELIEF EX) Apply 1 application topically daily as needed (knee pain).     hydrocortisone 2.5 % cream Apply 1 application topically daily as needed (facial breakouts).     JANUVIA 50 MG tablet TAKE 1 TABLET BY MOUTH EVERY DAY 90 tablet 1   Multiple Vitamins-Minerals (ICAPS AREDS 2 PO) Take 1 capsule by mouth daily.     nitroGLYCERIN (NITROSTAT) 0.4 MG SL tablet PLACE 1 TABLET UNDER THE TONGUE EVERY 5 MINUTES X 3 DOSES AS NEEDED FOR CHEST PAIN 75 tablet 1   pantoprazole (PROTONIX) 40 MG tablet TAKE 1 TABLET BY MOUTH EVERY DAY 90 tablet 1   rosuvastatin (CRESTOR) 20 MG tablet TAKE 1 TABLET BY MOUTH EVERY DAY 90 tablet 0   sertraline (ZOLOFT) 50 MG tablet Take 1 tablet (50 mg total) by mouth daily. 30 tablet 1   spironolactone (ALDACTONE) 25 MG tablet Take 1 tablet (25 mg total) by mouth daily. 90 tablet 3    empagliflozin (JARDIANCE) 10 MG TABS tablet Take 1 tablet (10 mg total) by mouth daily before breakfast. (Patient not taking: Reported on 09/15/2021) 30 tablet 3   No current facility-administered  medications for this visit.    Allergies:   Codeine and Morphine    ROS:  Please see the history of present illness.   Otherwise, review of systems are positive for headaches, pain in his eyes, bleeding..   All other systems are reviewed and negative.    PHYSICAL EXAM: VS:  BP (!) 135/56   Pulse 70   Ht '5\' 9"'  (1.753 m)   Wt 156 lb 12.8 oz (71.1 kg)   SpO2 96%   BMI 23.16 kg/m  , BMI Body mass index is 23.16 kg/m. GEN:  No distress, frail appearing  NECK:  No jugular venous distention at 90 degrees, waveform within normal limits, carotid upstroke brisk and symmetric, no bruits, no thyromegaly LYMPHATICS:  No cervical adenopathy LUNGS:  Clear to auscultation bilaterally BACK:  No CVA tenderness CHEST: Well-healed sternotomy scar HEART:  S1 and S2 within normal limits, no S3, no S4, no clicks, no rubs, 2 out of 6 apical systolic murmur radiating slightly up aortic outflow tract, no diastolic murmurs ABD:  Positive bowel sounds normal in frequency in pitch, no bruits, no rebound, no guarding, unable to assess midline mass or bruit with the patient seated. EXT:  2 plus pulses throughout, no edema, no cyanosis no clubbing SKIN:  No rashes no nodules NEURO:  Cranial nerves II through XII grossly intact, motor grossly intact throughout PSYCH:  Cognitively intact, oriented to person place and time  EKG:  EKG is  ordered today. Atrial fibrillation, rate 70, premature ectopic complexes, left bundle branch block, right axis deviation  Recent Labs: 04/27/2021: ALT 19 05/13/2021: NT-Pro BNP 10,919 08/18/2021: B Natriuretic Peptide 590.2; Magnesium 2.4; TSH 2.079 08/20/2021: BUN 23; Creatinine, Ser 1.46; Hemoglobin 13.5; Platelets 123; Potassium 4.6; Sodium 133    Lipid Panel    Component Value  Date/Time   CHOL 122 08/19/2021 0234   CHOL 97 (L) 01/01/2021 0848   TRIG 68 08/19/2021 0234   TRIG 306 (HH) 09/27/2006 1154   HDL 35 (L) 08/19/2021 0234   HDL 33 (L) 01/01/2021 0848   CHOLHDL 3.5 08/19/2021 0234   VLDL 14 08/19/2021 0234   LDLCALC 73 08/19/2021 0234   LDLCALC 46 01/01/2021 0848   LDLDIRECT 143.3 11/23/2007 0000      Wt Readings from Last 3 Encounters:  09/15/21 156 lb 12.8 oz (71.1 kg)  09/09/21 154 lb 8 oz (70.1 kg)  08/26/21 157 lb 3.2 oz (71.3 kg)     CARDIAC CATH  Diagnostic Dominance: Co-dominant Intervention    Other studies Reviewed: Additional studies/ records that were reviewed today include:  Hospital records Review of the above records demonstrates:   NA  ASSESSMENT AND PLAN:  CAD s/p CABG with Recent PCI He is status post PCI as above.  Unfortunately nothing any of his symptoms are necessarily related to his coronary disease as he did not improve post PCI.  He will continue with meds as above.  I will check a CBC today.   Status post TAVR He has had normal structure and function.  No change in therapy is indicated.  This was stable on follow-up echo.  Chronic Combined CHF He seems to be euvolemic.  No change in therapy.  Persistent Atrial Fibrillation He tolerates anticoagulation.  No change in therapy.   Carotid Artery Disease He is to have follow up Doppler in June 2023.    Hypertension The blood pressure is at target.  No change in therapy.   Hyperlipidemia LDL was 73.  No change  in therapy.  56.   Type 2 Diabetes Mellitus I will check an A1C.  He can stay off the Jardiance although I do not think this was causing his symptoms.    CKD Stage III-IV I will follow-up basic metabolic profile today.  Current medicines are reviewed at length with the patient today.  The patient does not have concerns regarding medicines.  The following changes have been made:   None  Labs/ tests ordered today include:   Orders Placed This  Encounter  Procedures   CBC   Basic metabolic panel   EKG 83-MHDQ      Disposition:   FU with me APP in 3 months.   Signed, Shawn Breeding, [name]  09/15/2021 5:26 PM    [place]

## 2021-09-15 ENCOUNTER — Ambulatory Visit: Payer: Medicare PPO | Admitting: Student

## 2021-09-15 ENCOUNTER — Other Ambulatory Visit: Payer: Self-pay

## 2021-09-15 ENCOUNTER — Encounter: Payer: Self-pay | Admitting: Cardiology

## 2021-09-15 ENCOUNTER — Ambulatory Visit: Payer: Medicare PPO | Admitting: Cardiology

## 2021-09-15 VITALS — BP 135/56 | HR 70 | Ht 69.0 in | Wt 156.8 lb

## 2021-09-15 DIAGNOSIS — I480 Paroxysmal atrial fibrillation: Secondary | ICD-10-CM | POA: Diagnosis not present

## 2021-09-15 DIAGNOSIS — I350 Nonrheumatic aortic (valve) stenosis: Secondary | ICD-10-CM | POA: Diagnosis not present

## 2021-09-15 DIAGNOSIS — I2510 Atherosclerotic heart disease of native coronary artery without angina pectoris: Secondary | ICD-10-CM | POA: Diagnosis not present

## 2021-09-15 DIAGNOSIS — I5042 Chronic combined systolic (congestive) and diastolic (congestive) heart failure: Secondary | ICD-10-CM

## 2021-09-15 NOTE — Patient Instructions (Signed)
Medication Instructions:  Your Physician recommend you continue on your current medication as directed.    *If you need a refill on your cardiac medications before your next appointment, please call your pharmacy*   Lab Work: Return for blood work cbc and bmet If you have labs (blood work) drawn today and your tests are completely normal, you will receive your results only by: [place] (if you have MyChart) OR A paper copy in the mail If you have any lab test that is abnormal or we need to change your treatment, we will call you to review the results.   Testing/Procedures: None   Follow-Up: At [facility], you and your health needs are our priority.  As part of our continuing mission to provide you with exceptional heart care, we have created designated Provider Care Teams.  These Care Teams include your primary Cardiologist (physician) and Advanced Practice Providers (APPs -  Physician Assistants and Nurse Practitioners) who all work together to provide you with the care you need, when you need it.  We recommend signing up for the patient portal called "MyChart".  Sign up information is provided on this After Visit Summary.  MyChart is used to connect with patients for Virtual Visits (Telemedicine).  Patients are able to view lab/test results, encounter notes, upcoming appointments, etc.  Non-urgent messages can be sent to your provider as well.   To learn more about what you can do with MyChart, go to [email or web address].    Your next appointment:   2 month(s)  The format for your next appointment:   In Person  Provider:   APP

## 2021-09-16 ENCOUNTER — Ambulatory Visit (HOSPITAL_COMMUNITY)
Admission: RE | Admit: 2021-09-16 | Discharge: 2021-09-16 | Disposition: A | Discharge status: Home / Self Care | Payer: Medicare PPO | Source: Ambulatory Visit | Attending: Family Medicine | Admitting: Family Medicine

## 2021-09-16 DIAGNOSIS — K224 Dyskinesia of esophagus: Secondary | ICD-10-CM | POA: Diagnosis not present

## 2021-09-16 DIAGNOSIS — R1310 Dysphagia, unspecified: Secondary | ICD-10-CM | POA: Insufficient documentation

## 2021-09-16 DIAGNOSIS — R634 Abnormal weight loss: Secondary | ICD-10-CM | POA: Insufficient documentation

## 2021-09-16 LAB — BASIC METABOLIC PANEL
BUN/Creatinine Ratio: 20 (ref 10–24)
BUN: 33 mg/dL — ABNORMAL HIGH (ref 8–27)
CO2: 23 mmol/L (ref 20–29)
Calcium: 10.1 mg/dL (ref 8.6–10.2)
Chloride: 90 mmol/L — ABNORMAL LOW (ref 96–106)
Creatinine, Ser: 1.61 mg/dL — ABNORMAL HIGH (ref 0.76–1.27)
Glucose: 253 mg/dL — ABNORMAL HIGH (ref 70–99)
Potassium: 4.5 mmol/L (ref 3.5–5.2)
Sodium: 126 mmol/L — ABNORMAL LOW (ref 134–144)
eGFR: 41 mL/min/{1.73_m2} — ABNORMAL LOW (ref >59)

## 2021-09-16 LAB — CBC
Hematocrit: 41.8 % (ref 37.5–51.0)
Hemoglobin: 13.7 g/dL (ref 13.0–17.7)
MCH: 26.5 pg — ABNORMAL LOW (ref 26.6–33.0)
MCHC: 32.8 g/dL (ref 31.5–35.7)
MCV: 81 fL (ref 79–97)
Platelets: 133 10*3/uL — ABNORMAL LOW (ref 150–450)
RBC: 5.17 x10E6/uL (ref 4.14–5.80)
RDW: 17 % — ABNORMAL HIGH (ref 11.6–15.4)
WBC: 9.1 10*3/uL (ref 3.4–10.8)

## 2021-09-17 ENCOUNTER — Telehealth: Payer: Self-pay | Admitting: *Deleted

## 2021-09-17 DIAGNOSIS — E871 Hypo-osmolality and hyponatremia: Secondary | ICD-10-CM

## 2021-09-17 NOTE — Telephone Encounter (Signed)
Spoke with pt wife, aware of results and dr hochrein's recommendations. Lab orders mailed to the pt

## 2021-09-17 NOTE — Telephone Encounter (Signed)
-----   Message from [name], MD sent at 09/17/2021  8:12 AM EST ----- Na is low which could make him fatigued.  Make sure his free water intake is reduced and please reduce his Lasix to 20 mg daily for the next week.  Repeat a BMET in one week. Call Mr. Bassett with the results and send results to [name], MD

## 2021-09-23 DIAGNOSIS — E871 Hypo-osmolality and hyponatremia: Secondary | ICD-10-CM | POA: Diagnosis not present

## 2021-09-23 LAB — BASIC METABOLIC PANEL
BUN/Creatinine Ratio: 13 (ref 10–24)
BUN: 19 mg/dL (ref 8–27)
CO2: 21 mmol/L (ref 20–29)
Calcium: 9.6 mg/dL (ref 8.6–10.2)
Chloride: 97 mmol/L (ref 96–106)
Creatinine, Ser: 1.51 mg/dL — ABNORMAL HIGH (ref 0.76–1.27)
Glucose: 180 mg/dL — ABNORMAL HIGH (ref 70–99)
Potassium: 4.3 mmol/L (ref 3.5–5.2)
Sodium: 134 mmol/L (ref 134–144)
eGFR: 45 mL/min/{1.73_m2} — ABNORMAL LOW (ref >59)

## 2021-09-23 NOTE — Telephone Encounter (Signed)
[name], MD  Michaelyn Barter, RN 2 weeks ago   Dr. Elease Hashimoto prescribes the [facility]

## 2021-09-23 NOTE — Telephone Encounter (Signed)
Called patient's wife -- apologized for delayed reply. He was seen by MD last week and he was advised OK to stop JARDIANCE (prescribed by our team) -- not [place] as previously mentioned. He is doing better since he has stopped this medication.

## 2021-09-24 ENCOUNTER — Encounter: Payer: Self-pay | Admitting: *Deleted

## 2021-09-30 ENCOUNTER — Ambulatory Visit: Payer: Medicare PPO | Admitting: Family Medicine

## 2021-09-30 VITALS — BP 120/60 | HR 79 | Temp 97.6°F | Wt 158.6 lb

## 2021-09-30 DIAGNOSIS — F339 Major depressive disorder, recurrent, unspecified: Secondary | ICD-10-CM | POA: Insufficient documentation

## 2021-09-30 DIAGNOSIS — I5022 Chronic systolic (congestive) heart failure: Secondary | ICD-10-CM | POA: Diagnosis not present

## 2021-09-30 DIAGNOSIS — E1121 Type 2 diabetes mellitus with diabetic nephropathy: Secondary | ICD-10-CM

## 2021-09-30 DIAGNOSIS — R634 Abnormal weight loss: Secondary | ICD-10-CM

## 2021-09-30 MED ORDER — INSULIN DEGLUDEC 100 UNIT/ML ~~LOC~~ SOLN: 10.0000 [IU] | Freq: Every day | SUBCUTANEOUS | 0 refills | Status: DC

## 2021-09-30 MED ORDER — INSULIN PEN NEEDLE 32G X 4 MM MISC: 1 refills | Status: DC

## 2021-09-30 MED ORDER — INSULIN DEGLUDEC 100 UNIT/ML ~~LOC~~ SOLN: 10.0000 [IU] | Freq: Every day | SUBCUTANEOUS | 1 refills | Status: DC

## 2021-09-30 NOTE — Progress Notes (Signed)
Established Patient Office Visit  Subjective:  Patient ID: [name], male    DOB: 1933/04/15  Age: 87 y.o. MRN: [id number]  CC:  Chief Complaint  Patient presents with   Follow-up    HPI [name] presents for follow-up regarding recent weight loss, fatigue, depression.  Refer to recent note for details.  He had NSTEMI and has history of chronic systolic heart failure.  Was placed recently on Jardiance and patient stopped this on his own.  He had recent labs through cardiology with sodium of 126.  He was not eating well and had lost substantial amount of weight of about 20 pounds.  His Lasix was reduced from twice daily to once daily.  Follow-up repeat labs through cardiology revealed sodium 134.  Patient has gained back about 4 pounds.  We had concerns that he was having weight loss possibly related to Jardiance in combination with depression.  We started sertraline and he is currently on 50 mg daily and he does feel like his mood has improved some.  Because of the rapid weight loss we had obtained CT abdomen and pelvis which showed no acute abnormalities.  Barium swallow did show some esophageal dysmotility and distal esophageal narrowing but 13 mm tablet did pass.  Patient feels like he is able to eat foods without difficulty at this point but he chews very slowly and tries to avoid things like steak.  He has type 2 diabetes.  Recent A1c 8.5%.  He has chronic kidney disease and cannot take metformin.  Is on low-dose Januvia.  Has taken long-acting insulin in the past.  Recent fasting blood sugars fairly consistently upper 150.  Past Medical History:  Diagnosis Date   Aortic stenosis, moderate 09/20/2020   Arthritis    CAD (coronary artery disease)    a. Cath September 2015 LIMA to the LAD patent, SVG to PDA patent, SVG to posterior lateral patent, SVG to OM with a 90% in-stent restenosis at an anastomotic lesion. This was treated with angioplasty. b. cath 04/01/2015 95% ISR in SVG to  OM treated with 2.75x24 Synergy DES postdilated to 3.48m, all other grafts patent   Cataract    surgery,B/L   CHF (congestive heart failure) (HCC)    Chronic kidney disease    nephrolithiasis   Diabetes mellitus    TYPE 2   GERD (gastroesophageal reflux disease)    Hernia    Hypercholesterolemia    Hypertension    Incontinence    hx  over 1 year,leaks without awareness   Other and unspecified diseases of appendix    [place]oxysmal [place] fibrillation) [place]    in the setting of ischemia on 03/31/2015   Pancreatitis    [place]   prostate bx=Adenocarcinoam,gleason 3+4=7,PSA=6.75   Shortness of breath dyspnea    Ulcer    hx gastric    Past Surgical History:  Procedure Laterality Date   APPENDECTOMY     BACK SURGERY     CARDIAC CATHETERIZATION N/A 04/01/2015   Procedure: Left Heart Cath and Cors/Grafts Angiography;  Surgeon: JJettie Booze MD;  [facility];  Service: Cardiovascular;  Laterality: N/A;   CARDIAC CATHETERIZATION N/A 04/01/2015   Procedure: Coronary Stent Intervention;  Surgeon: JJettie Booze MD;  [facility];  Service: Cardiovascular;  Laterality: N/A;   CARDIOVERSION N/A 11/01/2019   Procedure: CARDIOVERSION;  Surgeon: OGeralynn Rile MD;  [place] [facility]  Service: Cardiovascular;  Laterality: N/A;  CARPAL TUNNEL RELEASE  2004   both hands   CORONARY ARTERY BYPASS GRAFT     CORONARY BALLOON ANGIOPLASTY N/A 08/19/2021   Procedure: CORONARY BALLOON ANGIOPLASTY;  Surgeon: Burnell Blanks, MD;  Location: [place] [facility];  Service: Cardiovascular;  Laterality: N/A;   CORONARY STENT INTERVENTION N/A 09/23/2020   Procedure: CORONARY STENT INTERVENTION;  Surgeon: Sherren Mocha, MD;  Location: [place] [facility];  Service: Cardiovascular;  Laterality: N/A;   CYSTOSCOPY  08/23/12   incomplete emoptying bladder   HERNIA REPAIR     incision and drainage of right chest abscess      INTRAOPERATIVE TRANSTHORACIC ECHOCARDIOGRAM Left 04/28/2021   Procedure: INTRAOPERATIVE TRANSTHORACIC ECHOCARDIOGRAM;  Surgeon: Burnell Blanks, MD;  Location: [place];  Service: Open Heart Surgery;  Laterality: Left;   LAPAROSCOPIC CHOLECYSTECTOMY  09/2009   LEFT HEART CATH AND CORS/GRAFTS ANGIOGRAPHY N/A 08/19/2021   Procedure: LEFT HEART CATH AND CORS/GRAFTS ANGIOGRAPHY;  Surgeon: Burnell Blanks, MD;  Location: [place] [facility];  Service: Cardiovascular;  Laterality: N/A;   LEFT HEART CATHETERIZATION WITH CORONARY ANGIOGRAM N/A 07/19/2014   Procedure: LEFT HEART CATHETERIZATION WITH CORONARY ANGIOGRAM;  Surgeon: Leonie Man, MD;  [facility];  Service: Cardiovascular;  Laterality: N/A;   RECTAL SURGERY     RIGHT/LEFT HEART CATH AND CORONARY/GRAFT ANGIOGRAPHY N/A 09/23/2020   Procedure: RIGHT/LEFT HEART CATH AND CORONARY/GRAFT ANGIOGRAPHY;  Surgeon: Sherren Mocha, MD;  Location: [place] [facility];  Service: Cardiovascular;  Laterality: N/A;   ROBOT ASSISTED LAPAROSCOPIC RADICAL PROSTATECTOMY  2005   TRANSCATHETER AORTIC VALVE REPLACEMENT, TRANSFEMORAL Bilateral 04/28/2021   Procedure: TRANSCATHETER AORTIC VALVE REPLACEMENT, TRANSFEMORAL;  Surgeon: Burnell Blanks, MD;  Location: [place];  Service: Open Heart Surgery;  Laterality: Bilateral;   ULTRASOUND GUIDANCE FOR VASCULAR ACCESS Bilateral 04/28/2021   Procedure: ULTRASOUND GUIDANCE FOR VASCULAR ACCESS;  Surgeon: Burnell Blanks, MD;  Location: [place];  Service: Open Heart Surgery;  Laterality: Bilateral;    Family History  Problem Relation Age of Onset   Cancer Mother        stomach   Heart attack Father     Social History   Socioeconomic History   Marital status: Married    Spouse name: Not on file   Number of children: 2   Years of education: Not on file   Highest education level: Not on file  Occupational History    Employer: RETIRED    Comment: Retired  Tobacco Use   Smoking  status: Former    Packs/day: 0.50    Years: 5.00    Pack years: 2.50    Types: Cigarettes    Quit date: 07/20/1972    Years since quitting: 49.2   Smokeless tobacco: Never   Tobacco comments:    quit s  Vaping Use   Vaping Use: Never used  Substance and Sexual Activity   Alcohol use: No   Drug use: No    Comment: quit smoking 40 years ago   Sexual activity: Never  Other Topics Concern   Not on file  Social History Narrative   Retired   Married      Social Determinants of Radio broadcast assistant Strain: Low Risk    Difficulty of Paying Living Expenses: Not hard at all  Food Insecurity: No Food Insecurity   Worried About Charity fundraiser in the Last Year: Never true   Arboriculturist in the Last Year: Never true  Transportation Needs:  No Transportation Needs   Lack of Transportation (Medical): No   Lack of Transportation (Non-Medical): No  Physical Activity: Insufficiently Active   Days of Exercise per Week: 3 days   Minutes of Exercise per Session: 30 min  Stress: No Stress Concern Present   Feeling of Stress : Not at all  Social Connections: Socially Integrated   Frequency of Communication with Friends and Family: More than three times a week   Frequency of Social Gatherings with Friends and Family: More than three times a week   Attends Religious Services: More than 4 times per year   Active Member of [employer] or Organizations: Yes   Attends Archivist Meetings: 1 to 4 times per year   Marital Status: Married  Human resources officer Violence: Not At Risk   Fear of Current or Ex-Partner: No   Emotionally Abused: No   Physically Abused: No   Sexually Abused: No    Outpatient Medications Prior to Visit  Medication Sig Dispense Refill   albuterol (VENTOLIN HFA) 108 (90 Base) MCG/ACT inhaler Inhale 2 puffs into the lungs every 6 (six) hours as needed. (Patient taking differently: Inhale 2 puffs into the lungs every 6 (six) hours as needed for wheezing or  shortness of breath.) 18 g 5   amLODipine (NORVASC) 5 MG tablet TAKE 1 TABLET BY MOUTH EVERY DAY 90 tablet 3   apixaban (ELIQUIS) 2.5 MG TABS tablet Take 1 tablet (2.5 mg total) by mouth 2 (two) times daily. 180 tablet 1   blood glucose meter kit and supplies KIT Dispense based on patient and insurance preference. Use up to four times daily as directed. (FOR ICD-9 250.00, 250.01). 1 each 0   budesonide-formoterol (SYMBICORT) 160-4.5 MCG/ACT inhaler Inhale 2 puffs into the lungs in the morning and at bedtime. (Patient taking differently: Inhale 2 puffs into the lungs 2 (two) times daily as needed (sob/wheezing).) 1 each 12   Carboxymethylcellul-Glycerin (LUBRICATING EYE DROPS OP) Place 1 drop into both eyes daily as needed (dry eyes).     carvedilol (COREG) 6.25 MG tablet TAKE 1 TABLET (6.25 MG TOTAL) BY MOUTH 2 (TWO) TIMES DAILY WITH A MEAL. 180 tablet 3   clopidogrel (PLAVIX) 75 MG tablet Take 1 tablet (75 mg total) by mouth daily with breakfast. 90 tablet 2   Cyanocobalamin (VITAMIN B-12 PO) Take 1 tablet by mouth daily.     docusate sodium (STOOL SOFTENER) 100 MG capsule Take 1 capsule (100 mg total) by mouth 2 (two) times daily as needed for mild constipation. 30 capsule 0   furosemide (LASIX) 40 MG tablet Take 1 tablet (40 mg total) by mouth daily. 90 tablet 2   Homeopathic Products (THERAWORX RELIEF EX) Apply 1 application topically daily as needed (knee pain).     hydrocortisone 2.5 % cream Apply 1 application topically daily as needed (facial breakouts).     JANUVIA 50 MG tablet TAKE 1 TABLET BY MOUTH EVERY DAY 90 tablet 1   Multiple Vitamins-Minerals (ICAPS AREDS 2 PO) Take 1 capsule by mouth daily.     nitroGLYCERIN (NITROSTAT) 0.4 MG SL tablet PLACE 1 TABLET UNDER THE TONGUE EVERY 5 MINUTES X 3 DOSES AS NEEDED FOR CHEST PAIN 75 tablet 1   pantoprazole (PROTONIX) 40 MG tablet TAKE 1 TABLET BY MOUTH EVERY DAY 90 tablet 1   rosuvastatin (CRESTOR) 20 MG tablet TAKE 1 TABLET BY MOUTH EVERY DAY  90 tablet 0   sertraline (ZOLOFT) 50 MG tablet Take 1 tablet (50 mg total) by  mouth daily. 30 tablet 1   spironolactone (ALDACTONE) 25 MG tablet Take 1 tablet (25 mg total) by mouth daily. 90 tablet 3   No facility-administered medications prior to visit.    Allergies  Allergen Reactions   Codeine Other (See Comments)    Makes him feel goofy   Morphine Other (See Comments)    Nightmares and felt bad    ROS Review of Systems  Constitutional:  Negative for chills and fever.  Respiratory:  Negative for cough.        He has chronic dyspnea which is unchanged.  Gastrointestinal:  Negative for abdominal pain.  Genitourinary:  Negative for dysuria.  Neurological:  Negative for dizziness.  Psychiatric/Behavioral:  Negative for agitation and suicidal ideas.      Objective:    Physical Exam Constitutional:      Appearance: He is well-developed.  HENT:     Right Ear: External ear normal.     Left Ear: External ear normal.  Eyes:     Pupils: Pupils are equal, round, and reactive to light.  Neck:     Thyroid: No thyromegaly.  Cardiovascular:     Rate and Rhythm: Normal rate and regular rhythm.  Pulmonary:     Effort: Pulmonary effort is normal. No respiratory distress.     Breath sounds: Normal breath sounds. No wheezing or rales.  Musculoskeletal:     Cervical back: Neck supple.     Right lower leg: No edema.     Left lower leg: No edema.  Neurological:     Mental Status: He is alert and oriented to person, place, and time.  Psychiatric:     Comments: Mood much improved compared with last visit.    BP 120/60 (BP Location: Left Arm, Patient Position: Sitting, Cuff Size: Normal)    Pulse 79    Temp 97.6 F (36.4 C) (Oral)    Wt 158 lb 9.6 oz (71.9 kg)    SpO2 98%    BMI 23.42 kg/m  Wt Readings from Last 3 Encounters:  09/30/21 158 lb 9.6 oz (71.9 kg)  09/15/21 156 lb 12.8 oz (71.1 kg)  09/09/21 154 lb 8 oz (70.1 kg)     Health Maintenance Due  Topic Date Due    Zoster Vaccines- Shingrix (1 of 2) Never done   FOOT EXAM  02/08/2018   COVID-19 Vaccine (3 - Pfizer risk series) 01/12/2020   URINE MICROALBUMIN  07/29/2021    There are no preventive care reminders to display for this patient.  Lab Results  Component Value Date   TSH 2.079 08/18/2021   Lab Results  Component Value Date   WBC 9.1 09/15/2021   HGB 13.7 09/15/2021   HCT 41.8 09/15/2021   MCV 81 09/15/2021   PLT 133 (L) 09/15/2021   Lab Results  Component Value Date   NA 134 09/23/2021   K 4.3 09/23/2021   CO2 21 09/23/2021   GLUCOSE 180 (H) 09/23/2021   BUN 19 09/23/2021   CREATININE 1.51 (H) 09/23/2021   BILITOT 1.5 (H) 04/27/2021   ALKPHOS 97 04/27/2021   AST 14 (L) 04/27/2021   ALT 19 04/27/2021   PROT 6.8 04/27/2021   ALBUMIN 3.4 (L) 04/27/2021   CALCIUM 9.6 09/23/2021   ANIONGAP 8 08/20/2021   EGFR 45 (L) 09/23/2021   GFR 32.30 (L) 01/14/2020   Lab Results  Component Value Date   CHOL 122 08/19/2021   Lab Results  Component Value Date   HDL 35 (L)  08/19/2021   Lab Results  Component Value Date   LDLCALC 73 08/19/2021   Lab Results  Component Value Date   TRIG 68 08/19/2021   Lab Results  Component Value Date   CHOLHDL 3.5 08/19/2021   Lab Results  Component Value Date   HGBA1C 8.5 (A) 08/26/2021      Assessment & Plan:   #1 recent weight loss and fatigue.  Suspect he had combination of issues including Jardiance and severe depression.  This has improved with weight gain of almost 5 pounds since last visit.  This started to go back up after discontinuation of Jardiance.  We also started sertraline.  He feels much better overall  #2 depression, recurrent.  Patient improved at this time.  Currently on sertraline 50 mg daily.  He thinks some of this at least is related to improvement in physical health We have recommended at least minimum of 4 to 9 months of therapy and then consider tapering off  #3 type 2 diabetes suboptimally controlled with  recent A1c 8.5%.  He is not a candidate for metformin because of chronic kidney disease.  Cannot tolerate Jardiance.  Currently on low-dose Januvia.  Start back [place] 10 units once daily with sample given.  We will plan 81-monthfollow-up and recheck A1c then Nurse went over instructions for how to give Tresiba appropriately and answered their questions.  He has given insulin once before  #4 recent hyponatremia probably related to poor eating along with diuretic therapy and at least some component of pseudohyponatremia from his hyperglycemia-improved by recent labs from cardiology  #5 history of combined systolic and diastolic heart failure.  Appears to be euvolemic currently.  #6 recent dysphagia.  Barium swallow revealed no masses.  Does have some esophageal dysmotility.  We discussed possible referral to GI but at this point he wishes to wait.  We recommend he eat very slowly and chew foods well and avoid hard to chew foods such as steak.  Over 40 minutes spent with patient reviewing multiple issues above, going over new medication with TTyler Aas reviewing multiple recent labs and x-rays  Meds ordered this encounter  Medications   Insulin Degludec (TRESIBA) 100 UNIT/ML SOLN    Sig: Inject 10 Units into the skin daily.    Dispense:  3 mL    Refill:  0    Follow-up: Return in about 2 months (around 12/01/2021).    BCarolann Littler MD

## 2021-09-30 NOTE — Patient Instructions (Signed)
Continue the Sertraline 50 mg daily  START Tresiba 10 units [place] once daily  Set up 2 month follow up.

## 2021-10-01 ENCOUNTER — Other Ambulatory Visit: Payer: Self-pay | Admitting: Family Medicine

## 2021-10-06 ENCOUNTER — Other Ambulatory Visit: Payer: Self-pay | Admitting: Family Medicine

## 2021-10-20 ENCOUNTER — Telehealth: Payer: Self-pay | Admitting: Family Medicine

## 2021-10-20 NOTE — Telephone Encounter (Signed)
Patient spouse called in to schedule an appointment for spouse. Spouse had Aortic surgery 1 month ago, fell last week, and patient is having swollen feet and ankles.

## 2021-10-21 ENCOUNTER — Ambulatory Visit (INDEPENDENT_AMBULATORY_CARE_PROVIDER_SITE_OTHER): Payer: Medicare PPO

## 2021-10-21 ENCOUNTER — Other Ambulatory Visit: Payer: Self-pay

## 2021-10-21 ENCOUNTER — Ambulatory Visit: Payer: Medicare PPO | Admitting: Family Medicine

## 2021-10-21 ENCOUNTER — Ambulatory Visit (INDEPENDENT_AMBULATORY_CARE_PROVIDER_SITE_OTHER)
Admission: RE | Admit: 2021-10-21 | Discharge: 2021-10-21 | Disposition: A | Discharge status: Home / Self Care | Payer: Medicare PPO | Source: Ambulatory Visit | Attending: Family Medicine | Admitting: Family Medicine

## 2021-10-21 DIAGNOSIS — M79621 Pain in right upper arm: Secondary | ICD-10-CM | POA: Diagnosis not present

## 2021-10-21 DIAGNOSIS — S0990XA Unspecified injury of head, initial encounter: Secondary | ICD-10-CM | POA: Diagnosis not present

## 2021-10-21 DIAGNOSIS — R600 Localized edema: Secondary | ICD-10-CM | POA: Diagnosis not present

## 2021-10-21 DIAGNOSIS — M25511 Pain in right shoulder: Secondary | ICD-10-CM

## 2021-10-21 DIAGNOSIS — R519 Headache, unspecified: Secondary | ICD-10-CM | POA: Diagnosis not present

## 2021-10-21 LAB — BRAIN NATRIURETIC PEPTIDE: Pro B Natriuretic peptide (BNP): 1109 pg/mL — ABNORMAL HIGH (ref 0.0–100.0)

## 2021-10-21 LAB — BASIC METABOLIC PANEL
BUN: 26 mg/dL — ABNORMAL HIGH (ref 6–23)
CO2: 27 mEq/L (ref 19–32)
Calcium: 9.9 mg/dL (ref 8.4–10.5)
Chloride: 98 mEq/L (ref 96–112)
Creatinine, Ser: 1.56 mg/dL — ABNORMAL HIGH (ref 0.40–1.50)
GFR: 39.94 mL/min — ABNORMAL LOW (ref >60.00)
Glucose, Bld: 147 mg/dL — ABNORMAL HIGH (ref 70–99)
Potassium: 4 mEq/L (ref 3.5–5.1)
Sodium: 135 mEq/L (ref 135–145)

## 2021-10-21 NOTE — Progress Notes (Signed)
Established Patient Office Visit  Subjective:  Patient ID: [name], male    DOB: 1932-12-29  Age: 88 y.o. MRN: [id number]  CC:  Chief Complaint  Patient presents with   [name] x 1 week ago. Pt tripped over branches & fell into tree, top of head hit tree. States has h/a since then    Foot Swelling    Bilateral x "couple weeks"    HPI [name] presents for assessment following fall.  He has multiple problems had recent aortic valve surgery and following surgery had some significant weight loss and depression issues.  He seemed to be stabilizing but then on 09-30-2021 who was outside walking and states that he tripped on some "brush ".  He apparently landed forward striking a tree with the parietal aspect of his skull.  There was no reported loss of consciousness.  He apparently fell to the ground and had difficulty getting up and family did not discover him until about 2 hours later.  He noticed some right shoulder pain and right mid humerus pain afterwards.  Mild right knee pain.  No hip pain.  Never sought any medical care.  He has significant shoulder pain at this time and difficulty with abduction secondary to pain and possibly some weakness.  He also relates daily headache parietal area radiating frontally.  No nausea or vomiting.  No confusion.  Pain is relatively constant.  No history of chronic headaches.  He does take Eliquis.  Other issue is progressive bilateral leg edema over the past couple of weeks.  His weight is up 9 pounds from December 14.  He currently takes furosemide 40 mg daily.  He does have history of chronic heart failure.  Denies any orthopnea.  Past Medical History:  Diagnosis Date   Aortic stenosis, moderate 09/20/2020   Arthritis    CAD (coronary artery disease)    a. Cath September 2015 LIMA to the LAD patent, SVG to PDA patent, SVG to posterior lateral patent, SVG to OM with a 90% in-stent restenosis at an anastomotic lesion. This was treated  with angioplasty. b. cath 04/01/2015 95% ISR in SVG to OM treated with 2.75x24 Synergy DES postdilated to 3.73m, all other grafts patent   Cataract    surgery,B/L   CHF (congestive heart failure) (HCC)    Chronic kidney disease    nephrolithiasis   Diabetes mellitus    TYPE 2   GERD (gastroesophageal reflux disease)    Hernia    Hypercholesterolemia    Hypertension    Incontinence    hx  over 1 year,leaks without awareness   Other and unspecified diseases of appendix    PAF (paroxysmal atrial fibrillation) ([facility]    in the setting of ischemia on 03/31/2015   Pancreatitis    [place]   prostate bx=Adenocarcinoam,gleason 3+4=7,PSA=6.75   Shortness of breath dyspnea    Ulcer    hx gastric    Past Surgical History:  Procedure Laterality Date   APPENDECTOMY     BACK SURGERY     CARDIAC CATHETERIZATION N/A 04/01/2015   Procedure: Left Heart Cath and Cors/Grafts Angiography;  Surgeon: JJettie Booze MD;  [facility];  Service: Cardiovascular;  Laterality: N/A;   CARDIAC CATHETERIZATION N/A 04/01/2015   Procedure: Coronary Stent Intervention;  Surgeon: JJettie Booze MD;  [facility];  Service: Cardiovascular;  Laterality: N/A;   CARDIOVERSION N/A 11/01/2019   Procedure:  CARDIOVERSION;  Surgeon: Geralynn Rile, MD;  Location: [facility];  Service: Cardiovascular;  Laterality: N/A;   CARPAL TUNNEL RELEASE  2004   both hands   CORONARY ARTERY BYPASS GRAFT     CORONARY BALLOON ANGIOPLASTY N/A 08/19/2021   Procedure: CORONARY BALLOON ANGIOPLASTY;  Surgeon: Burnell Blanks, MD;  Location: [facility];  Service: Cardiovascular;  Laterality: N/A;   CORONARY STENT INTERVENTION N/A 09/23/2020   Procedure: CORONARY STENT INTERVENTION;  Surgeon: Sherren Mocha, MD;  Location: [facility];  Service: Cardiovascular;  Laterality: N/A;   CYSTOSCOPY  08/23/12   incomplete emoptying bladder   HERNIA REPAIR      incision and drainage of right chest abscess     INTRAOPERATIVE TRANSTHORACIC ECHOCARDIOGRAM Left 04/28/2021   Procedure: INTRAOPERATIVE TRANSTHORACIC ECHOCARDIOGRAM;  Surgeon: Burnell Blanks, MD;  Location: [facility];  Service: Open Heart Surgery;  Laterality: Left;   LAPAROSCOPIC CHOLECYSTECTOMY  09/2009   LEFT HEART CATH AND CORS/GRAFTS ANGIOGRAPHY N/A 08/19/2021   Procedure: LEFT HEART CATH AND CORS/GRAFTS ANGIOGRAPHY;  Surgeon: Burnell Blanks, MD;  Location: [facility];  Service: Cardiovascular;  Laterality: N/A;   LEFT HEART CATHETERIZATION WITH CORONARY ANGIOGRAM N/A 07/19/2014   Procedure: LEFT HEART CATHETERIZATION WITH CORONARY ANGIOGRAM;  Surgeon: Leonie Man, MD;  Location: [facility];  Service: Cardiovascular;  Laterality: N/A;   RECTAL SURGERY     RIGHT/LEFT HEART CATH AND CORONARY/GRAFT ANGIOGRAPHY N/A 09/23/2020   Procedure: RIGHT/LEFT HEART CATH AND CORONARY/GRAFT ANGIOGRAPHY;  Surgeon: Sherren Mocha, MD;  Location: [facility];  Service: Cardiovascular;  Laterality: N/A;   ROBOT ASSISTED LAPAROSCOPIC RADICAL PROSTATECTOMY  2005   TRANSCATHETER AORTIC VALVE REPLACEMENT, TRANSFEMORAL Bilateral 04/28/2021   Procedure: TRANSCATHETER AORTIC VALVE REPLACEMENT, TRANSFEMORAL;  Surgeon: Burnell Blanks, MD;  Location: [facility];  Service: Open Heart Surgery;  Laterality: Bilateral;   ULTRASOUND GUIDANCE FOR VASCULAR ACCESS Bilateral 04/28/2021   Procedure: ULTRASOUND GUIDANCE FOR VASCULAR ACCESS;  Surgeon: Burnell Blanks, MD;  Location: [facility];  Service: Open Heart Surgery;  Laterality: Bilateral;    Family History  Problem Relation Age of Onset   Cancer Mother        stomach   Heart attack Father     Social History   Socioeconomic History   Marital status: Married    Spouse name: Not on file   Number of children: 2   Years of education: Not on file   Highest education level: Not on file  Occupational History    Employer: RETIRED     Comment: Retired  Tobacco Use   Smoking status: Former    Packs/day: 0.50    Years: 5.00    Pack years: 2.50    Types: Cigarettes    Quit date: 07/20/1972    Years since quitting: 49.2   Smokeless tobacco: Never   Tobacco comments:    quit s  Vaping Use   Vaping Use: Never used  Substance and Sexual Activity   Alcohol use: No   Drug use: No    Comment: quit smoking 40 years ago   Sexual activity: Never  Other Topics Concern   Not on file  Social History Narrative   Retired   Married      Social Determinants of Radio broadcast assistant Strain: Low Risk    Difficulty of Paying Living Expenses: Not hard at all  Food Insecurity: No Food Insecurity   Worried About Charity fundraiser in the  Last Year: Never true   Ran Out of Food in the Last Year: Never true  Transportation Needs: No Transportation Needs   Lack of Transportation (Medical): No   Lack of Transportation (Non-Medical): No  Physical Activity: Insufficiently Active   Days of Exercise per Week: 3 days   Minutes of Exercise per Session: 30 min  Stress: No Stress Concern Present   Feeling of Stress : Not at all  Social Connections: Socially Integrated   Frequency of Communication with Friends and Family: More than three times a week   Frequency of Social Gatherings with Friends and Family: More than three times a week   Attends Religious Services: More than 4 times per year   Active Member of [employer] or Organizations: Yes   Attends Archivist Meetings: 1 to 4 times per year   Marital Status: Married  Human resources officer Violence: Not At Risk   Fear of Current or Ex-Partner: No   Emotionally Abused: No   Physically Abused: No   Sexually Abused: No    Outpatient Medications Prior to Visit  Medication Sig Dispense Refill   albuterol (VENTOLIN HFA) 108 (90 Base) MCG/ACT inhaler Inhale 2 puffs into the lungs every 6 (six) hours as needed. (Patient taking differently: Inhale 2 puffs into the lungs every 6  (six) hours as needed for wheezing or shortness of breath.) 18 g 5   amLODipine (NORVASC) 5 MG tablet TAKE 1 TABLET BY MOUTH EVERY DAY 90 tablet 3   apixaban (ELIQUIS) 2.5 MG TABS tablet Take 1 tablet (2.5 mg total) by mouth 2 (two) times daily. 180 tablet 1   blood glucose meter kit and supplies KIT Dispense based on patient and insurance preference. Use up to four times daily as directed. (FOR ICD-9 250.00, 250.01). 1 each 0   budesonide-formoterol (SYMBICORT) 160-4.5 MCG/ACT inhaler Inhale 2 puffs into the lungs in the morning and at bedtime. (Patient taking differently: Inhale 2 puffs into the lungs 2 (two) times daily as needed (sob/wheezing).) 1 each 12   Carboxymethylcellul-Glycerin (LUBRICATING EYE DROPS OP) Place 1 drop into both eyes daily as needed (dry eyes).     carvedilol (COREG) 6.25 MG tablet TAKE 1 TABLET (6.25 MG TOTAL) BY MOUTH 2 (TWO) TIMES DAILY WITH A MEAL. 180 tablet 3   clopidogrel (PLAVIX) 75 MG tablet Take 1 tablet (75 mg total) by mouth daily with breakfast. 90 tablet 2   Cyanocobalamin (VITAMIN B-12 PO) Take 1 tablet by mouth daily.     docusate sodium (STOOL SOFTENER) 100 MG capsule Take 1 capsule (100 mg total) by mouth 2 (two) times daily as needed for mild constipation. 30 capsule 0   furosemide (LASIX) 40 MG tablet Take 1 tablet (40 mg total) by mouth daily. 90 tablet 2   Homeopathic Products (THERAWORX RELIEF EX) Apply 1 application topically daily as needed (knee pain).     hydrocortisone 2.5 % cream Apply 1 application topically daily as needed (facial breakouts).     Insulin Degludec (TRESIBA) 100 UNIT/ML SOLN Inject 10 Units into the skin daily. 3 mL 0   Insulin Pen Needle 32G X 4 MM MISC Use as directed. 30 each 1   JANUVIA 50 MG tablet TAKE 1 TABLET BY MOUTH EVERY DAY 90 tablet 1   Multiple Vitamins-Minerals (ICAPS AREDS 2 PO) Take 1 capsule by mouth daily.     nitroGLYCERIN (NITROSTAT) 0.4 MG SL tablet PLACE 1 TABLET UNDER THE TONGUE EVERY 5 MINUTES X 3 DOSES  AS NEEDED FOR  CHEST PAIN 75 tablet 1   pantoprazole (PROTONIX) 40 MG tablet TAKE 1 TABLET BY MOUTH EVERY DAY 90 tablet 1   rosuvastatin (CRESTOR) 20 MG tablet TAKE 1 TABLET BY MOUTH EVERY DAY 90 tablet 0   sertraline (ZOLOFT) 50 MG tablet TAKE 1 TABLET BY MOUTH EVERY DAY 90 tablet 1   spironolactone (ALDACTONE) 25 MG tablet Take 1 tablet (25 mg total) by mouth daily. 90 tablet 3   Insulin Degludec (TRESIBA) 100 UNIT/ML SOLN Inject 10 Units into the skin daily. 3 mL 1   No facility-administered medications prior to visit.    Allergies  Allergen Reactions   Codeine Other (See Comments)    Makes him feel goofy   Morphine Other (See Comments)    Nightmares and felt bad    ROS Review of Systems  Constitutional:  Positive for fatigue.  Respiratory:  Negative for cough and wheezing.   Cardiovascular:  Negative for chest pain.  Gastrointestinal:  Negative for nausea and vomiting.  Neurological:  Positive for headaches. Negative for syncope and speech difficulty.  Psychiatric/Behavioral:  Negative for confusion.      Objective:    Physical Exam Vitals reviewed.  Constitutional:      Appearance: Normal appearance.  HENT:     Head: Normocephalic and atraumatic.  Eyes:     Pupils: Pupils are equal, round, and reactive to light.  Neck:     Comments: No cervical spinal tenderness. Cardiovascular:     Rate and Rhythm: Normal rate.  Pulmonary:     Effort: Pulmonary effort is normal.     Breath sounds: Normal breath sounds.  Musculoskeletal:     Cervical back: Neck supple.     Right lower leg: Edema present.     Left lower leg: Edema present.     Comments: Right clavicle tenderness.  No acute bony tenderness.  Does have some proximal right humerus tenderness.  He has difficulty abducting right shoulder secondary to pain  He has 1+ to 2+ pitting edema lower legs bilaterally  Neurological:     General: No focal deficit present.     Mental Status: He is alert and oriented to person,  place, and time.     Cranial Nerves: No cranial nerve deficit.    There were no vitals taken for this visit. Wt Readings from Last 3 Encounters:  09/30/21 158 lb 9.6 oz (71.9 kg)  09/15/21 156 lb 12.8 oz (71.1 kg)  09/09/21 154 lb 8 oz (70.1 kg)     Health Maintenance Due  Topic Date Due   Zoster Vaccines- Shingrix (1 of 2) Never done   FOOT EXAM  02/08/2018   COVID-19 Vaccine (3 - Pfizer risk series) 01/12/2020   URINE MICROALBUMIN  07/29/2021    There are no preventive care reminders to display for this patient.  Lab Results  Component Value Date   TSH 2.079 08/18/2021   Lab Results  Component Value Date   WBC 9.1 09/15/2021   HGB 13.7 09/15/2021   HCT 41.8 09/15/2021   MCV 81 09/15/2021   PLT 133 (L) 09/15/2021   Lab Results  Component Value Date   NA 134 09/23/2021   K 4.3 09/23/2021   CO2 21 09/23/2021   GLUCOSE 180 (H) 09/23/2021   BUN 19 09/23/2021   CREATININE 1.51 (H) 09/23/2021   BILITOT 1.5 (H) 04/27/2021   ALKPHOS 97 04/27/2021   AST 14 (L) 04/27/2021   ALT 19 04/27/2021   PROT 6.8 04/27/2021   ALBUMIN  3.4 (L) 04/27/2021   CALCIUM 9.6 09/23/2021   ANIONGAP 8 08/20/2021   EGFR 45 (L) 09/23/2021   GFR 32.30 (L) 01/14/2020   Lab Results  Component Value Date   CHOL 122 08/19/2021   Lab Results  Component Value Date   HDL 35 (L) 08/19/2021   Lab Results  Component Value Date   LDLCALC 73 08/19/2021   Lab Results  Component Value Date   TRIG 68 08/19/2021   Lab Results  Component Value Date   CHOLHDL 3.5 08/19/2021   Lab Results  Component Value Date   HGBA1C 8.5 (A) 08/26/2021      Assessment & Plan:   Problem List Items Addressed This Visit   None Visit Diagnoses     Traumatic injury of head, initial encounter    -  Primary   Relevant Orders   CT HEAD WO CONTRAST (5MM)   Acute pain of right shoulder       Relevant Orders   DG Shoulder Right   DG Humerus Right   Bilateral leg edema       Relevant Orders   Basic  metabolic panel   Brain Natriuretic Peptide     Patient presents with recent head trauma.  He is on chronic Eliquis.  Nonfocal neuro exam but does relate 13-day history of daily headache.  High risk for subdural bleed.  -Check CT head without contrast.  This will be ordered stat  Regarding his right shoulder pains obtain x-rays in office of right shoulder and right humerus.  May have rotator cuff tear but is a poor surgical candidate with his heart history  -Check BNP and basic metabolic panel.  Increase furosemide to 40 mg twice daily for the next 5 days and monitor weights closely.  Set up 1 week follow-up.  No orders of the defined types were placed in this encounter.   Follow-up: Return in about 1 week (around 10/28/2021).    Carolann Littler, MD

## 2021-10-21 NOTE — Patient Instructions (Signed)
Increase the Furosemide 40 mg to twice daily for 5 days and then drop back to once daily

## 2021-10-28 ENCOUNTER — Ambulatory Visit: Payer: Medicare PPO | Admitting: Family Medicine

## 2021-10-28 VITALS — BP 120/60 | HR 88 | Temp 98.2°F | Wt 168.8 lb

## 2021-10-28 DIAGNOSIS — I5023 Acute on chronic systolic (congestive) heart failure: Secondary | ICD-10-CM | POA: Diagnosis not present

## 2021-10-28 DIAGNOSIS — R519 Headache, unspecified: Secondary | ICD-10-CM

## 2021-10-28 DIAGNOSIS — T691XXA Chilblains, initial encounter: Secondary | ICD-10-CM | POA: Diagnosis not present

## 2021-10-28 DIAGNOSIS — R600 Localized edema: Secondary | ICD-10-CM

## 2021-10-28 MED ORDER — TORSEMIDE 20 MG PO TABS: 20.0000 mg | ORAL_TABLET | Freq: Every day | ORAL | 5 refills | Status: DC

## 2021-10-28 NOTE — Patient Instructions (Signed)
Stop the Furosemide  Start the Torsemide 20 mg once daily  Set up 2 week follow up;.

## 2021-10-28 NOTE — Progress Notes (Signed)
Established Patient Office Visit  Subjective:  Patient ID: [name], male    DOB: Apr 16, 1933  Age: 88 y.o. MRN: [id number]  CC:  Chief Complaint  Patient presents with   Follow-up    HPI [name] presents for follow-up from recent fall.  Refer to previous note for details.  He fell on 09-30-2021 and actually tripped on some brush and fell forward hitting a tree with his head.Marland Kitchen  He was outside approximately 2 hours before his family discovered and he was unable to get up without assistance.  He had some headache when he presented here last visit and we obtained CT head which fortunately showed no signs of bleed.  He still has occasional parietal and fairly diffuse headaches.  Interestingly, he has noted when he wears a toboggan and keeps his head warm his headaches seem to go away.  He is not using regular analgesics.  No confusion.  No nausea or vomiting.  He developed blistery rash on his hands very likely related to chilblains from rapid warming after being in the cold for couple hours.  X-rays right shoulder and humerus reveal no fracture.  His right shoulder pain is slowly improving.  He had increased edema last visit.  BNP level was over 1100.  We increase his furosemide to 40 mg twice daily.  He is not seeing much difference in his weight and is essentially the same.  He has some chronic dyspnea unchanged.  Does have history of heart failure.  Past Medical History:  Diagnosis Date   Aortic stenosis, moderate 09/20/2020   Arthritis    CAD (coronary artery disease)    a. Cath September 2015 LIMA to the LAD patent, SVG to PDA patent, SVG to posterior lateral patent, SVG to OM with a 90% in-stent restenosis at an anastomotic lesion. This was treated with angioplasty. b. cath 04/01/2015 95% ISR in SVG to OM treated with 2.75x24 Synergy DES postdilated to 3.54m, all other grafts patent   Cataract    surgery,B/L   CHF (congestive heart failure) (HCC)    Chronic kidney disease     nephrolithiasis   Diabetes mellitus    TYPE 2   GERD (gastroesophageal reflux disease)    Hernia    Hypercholesterolemia    Hypertension    Incontinence    hx  over 1 year,leaks without awareness   Other and unspecified diseases of appendix    PAF (paroxysmal atrial fibrillation) ([facility]    in the setting of ischemia on 03/31/2015   Pancreatitis    Prostate CA ([facility] 03/01/04   prostate bx=Adenocarcinoam,gleason 3+4=7,PSA=6.75   Shortness of breath dyspnea    Ulcer    hx gastric    Past Surgical History:  Procedure Laterality Date   APPENDECTOMY     BACK SURGERY     CARDIAC CATHETERIZATION N/A 04/01/2015   Procedure: Left Heart Cath and Cors/Grafts Angiography;  Surgeon: JJettie Booze MD;  [facility];  Service: Cardiovascular;  Laterality: N/A;   CARDIAC CATHETERIZATION N/A 04/01/2015   Procedure: Coronary Stent Intervention;  Surgeon: JJettie Booze MD;  [facility];  Service: Cardiovascular;  Laterality: N/A;   CARDIOVERSION N/A 11/01/2019   Procedure: CARDIOVERSION;  Surgeon: OGeralynn Rile MD;  [facility]  Service: Cardiovascular;  Laterality: N/A;   CARPAL TUNNEL RELEASE  2004   both hands   CORONARY ARTERY BYPASS GRAFT     CORONARY BALLOON ANGIOPLASTY N/A 08/19/2021  Procedure: CORONARY BALLOON ANGIOPLASTY;  Surgeon: Burnell Blanks, MD;  Location: [facility];  Service: Cardiovascular;  Laterality: N/A;   CORONARY STENT INTERVENTION N/A 09/23/2020   Procedure: CORONARY STENT INTERVENTION;  Surgeon: Sherren Mocha, MD;  Location: [facility];  Service: Cardiovascular;  Laterality: N/A;   CYSTOSCOPY  08/23/12   incomplete emoptying bladder   HERNIA REPAIR     incision and drainage of right chest abscess     INTRAOPERATIVE TRANSTHORACIC ECHOCARDIOGRAM Left 04/28/2021   Procedure: INTRAOPERATIVE TRANSTHORACIC ECHOCARDIOGRAM;  Surgeon: Burnell Blanks, MD;  Location: [facility];  Service:  Open Heart Surgery;  Laterality: Left;   LAPAROSCOPIC CHOLECYSTECTOMY  09/2009   LEFT HEART CATH AND CORS/GRAFTS ANGIOGRAPHY N/A 08/19/2021   Procedure: LEFT HEART CATH AND CORS/GRAFTS ANGIOGRAPHY;  Surgeon: Burnell Blanks, MD;  Location: [facility];  Service: Cardiovascular;  Laterality: N/A;   LEFT HEART CATHETERIZATION WITH CORONARY ANGIOGRAM N/A 07/19/2014   Procedure: LEFT HEART CATHETERIZATION WITH CORONARY ANGIOGRAM;  Surgeon: Leonie Man, MD;  Location: [facility];  Service: Cardiovascular;  Laterality: N/A;   RECTAL SURGERY     RIGHT/LEFT HEART CATH AND CORONARY/GRAFT ANGIOGRAPHY N/A 09/23/2020   Procedure: RIGHT/LEFT HEART CATH AND CORONARY/GRAFT ANGIOGRAPHY;  Surgeon: Sherren Mocha, MD;  Location: [place] [facility];  Service: Cardiovascular;  Laterality: N/A;   ROBOT ASSISTED LAPAROSCOPIC RADICAL PROSTATECTOMY  2005   TRANSCATHETER AORTIC VALVE REPLACEMENT, TRANSFEMORAL Bilateral 04/28/2021   Procedure: TRANSCATHETER AORTIC VALVE REPLACEMENT, TRANSFEMORAL;  Surgeon: Burnell Blanks, MD;  Location: [place];  Service: Open Heart Surgery;  Laterality: Bilateral;   ULTRASOUND GUIDANCE FOR VASCULAR ACCESS Bilateral 04/28/2021   Procedure: ULTRASOUND GUIDANCE FOR VASCULAR ACCESS;  Surgeon: Burnell Blanks, MD;  Location: [place];  Service: Open Heart Surgery;  Laterality: Bilateral;    Family History  Problem Relation Age of Onset   Cancer Mother        stomach   Heart attack Father     Social History   Socioeconomic History   Marital status: Married    Spouse name: Not on file   Number of children: 2   Years of education: Not on file   Highest education level: Not on file  Occupational History    Employer: RETIRED    Comment: Retired  Tobacco Use   Smoking status: Former    Packs/day: 0.50    Years: 5.00    Pack years: 2.50    Types: Cigarettes    Quit date: 07/20/1972    Years since quitting: 49.3   Smokeless tobacco: Never   Tobacco  comments:    quit s  Vaping Use   Vaping Use: Never used  Substance and Sexual Activity   Alcohol use: No   Drug use: No    Comment: quit smoking 40 years ago   Sexual activity: Never  Other Topics Concern   Not on file  Social History Narrative   Retired   Married      Social Determinants of Radio broadcast assistant Strain: Low Risk    Difficulty of Paying Living Expenses: Not hard at all  Food Insecurity: No Food Insecurity   Worried About Charity fundraiser in the Last Year: Never true   Arboriculturist in the Last Year: Never true  Transportation Needs: No Transportation Needs   Lack of Transportation (Medical): No   Lack of Transportation (Non-Medical): No  Physical Activity: Insufficiently Active   Days of  Exercise per Week: 3 days   Minutes of Exercise per Session: 30 min  Stress: No Stress Concern Present   Feeling of Stress : Not at all  Social Connections: Socially Integrated   Frequency of Communication with Friends and Family: More than three times a week   Frequency of Social Gatherings with Friends and Family: More than three times a week   Attends Religious Services: More than 4 times per year   Active Member of [employer] or Organizations: Yes   Attends Archivist Meetings: 1 to 4 times per year   Marital Status: Married  Human resources officer Violence: Not At Risk   Fear of Current or Ex-Partner: No   Emotionally Abused: No   Physically Abused: No   Sexually Abused: No    Outpatient Medications Prior to Visit  Medication Sig Dispense Refill   albuterol (VENTOLIN HFA) 108 (90 Base) MCG/ACT inhaler Inhale 2 puffs into the lungs every 6 (six) hours as needed. (Patient taking differently: Inhale 2 puffs into the lungs every 6 (six) hours as needed for wheezing or shortness of breath.) 18 g 5   amLODipine (NORVASC) 5 MG tablet TAKE 1 TABLET BY MOUTH EVERY DAY 90 tablet 3   apixaban (ELIQUIS) 2.5 MG TABS tablet Take 1 tablet (2.5 mg total) by mouth 2  (two) times daily. 180 tablet 1   blood glucose meter kit and supplies KIT Dispense based on patient and insurance preference. Use up to four times daily as directed. (FOR ICD-9 250.00, 250.01). 1 each 0   budesonide-formoterol (SYMBICORT) 160-4.5 MCG/ACT inhaler Inhale 2 puffs into the lungs in the morning and at bedtime. (Patient taking differently: Inhale 2 puffs into the lungs 2 (two) times daily as needed (sob/wheezing).) 1 each 12   Carboxymethylcellul-Glycerin (LUBRICATING EYE DROPS OP) Place 1 drop into both eyes daily as needed (dry eyes).     carvedilol (COREG) 6.25 MG tablet TAKE 1 TABLET (6.25 MG TOTAL) BY MOUTH 2 (TWO) TIMES DAILY WITH A MEAL. 180 tablet 3   clopidogrel (PLAVIX) 75 MG tablet Take 1 tablet (75 mg total) by mouth daily with breakfast. 90 tablet 2   Cyanocobalamin (VITAMIN B-12 PO) Take 1 tablet by mouth daily.     docusate sodium (STOOL SOFTENER) 100 MG capsule Take 1 capsule (100 mg total) by mouth 2 (two) times daily as needed for mild constipation. 30 capsule 0   Homeopathic Products (THERAWORX RELIEF EX) Apply 1 application topically daily as needed (knee pain).     hydrocortisone 2.5 % cream Apply 1 application topically daily as needed (facial breakouts).     Insulin Degludec (TRESIBA) 100 UNIT/ML SOLN Inject 10 Units into the skin daily. 3 mL 0   Insulin Pen Needle 32G X 4 MM MISC Use as directed. 30 each 1   JANUVIA 50 MG tablet TAKE 1 TABLET BY MOUTH EVERY DAY 90 tablet 1   Multiple Vitamins-Minerals (ICAPS AREDS 2 PO) Take 1 capsule by mouth daily.     nitroGLYCERIN (NITROSTAT) 0.4 MG SL tablet PLACE 1 TABLET UNDER THE TONGUE EVERY 5 MINUTES X 3 DOSES AS NEEDED FOR CHEST PAIN 75 tablet 1   pantoprazole (PROTONIX) 40 MG tablet TAKE 1 TABLET BY MOUTH EVERY DAY 90 tablet 1   rosuvastatin (CRESTOR) 20 MG tablet TAKE 1 TABLET BY MOUTH EVERY DAY 90 tablet 0   sertraline (ZOLOFT) 50 MG tablet TAKE 1 TABLET BY MOUTH EVERY DAY 90 tablet 1   spironolactone (ALDACTONE) 25  MG tablet Take  1 tablet (25 mg total) by mouth daily. 90 tablet 3   furosemide (LASIX) 40 MG tablet Take 1 tablet (40 mg total) by mouth daily. 90 tablet 2   No facility-administered medications prior to visit.    Allergies  Allergen Reactions   Codeine Other (See Comments)    Makes him feel goofy   Morphine Other (See Comments)    Nightmares and felt bad    ROS Review of Systems  Constitutional:  Negative for fatigue.  Eyes:  Negative for visual disturbance.  Respiratory:  Negative for cough, chest tightness and shortness of breath.   Cardiovascular:  Positive for leg swelling. Negative for chest pain and palpitations.  Neurological:  Positive for headaches. Negative for dizziness, syncope, weakness and light-headedness.  Psychiatric/Behavioral:  Negative for confusion.      Objective:    Physical Exam Constitutional:      Appearance: Normal appearance. He is well-developed.  HENT:     Head: Normocephalic and atraumatic.     Right Ear: External ear normal.     Left Ear: External ear normal.  Eyes:     Pupils: Pupils are equal, round, and reactive to light.  Neck:     Thyroid: No thyromegaly.  Cardiovascular:     Rate and Rhythm: Normal rate and regular rhythm.  Pulmonary:     Effort: Pulmonary effort is normal. No respiratory distress.     Breath sounds: Normal breath sounds. No wheezing or rales.  Musculoskeletal:     Cervical back: Neck supple.     Comments: He has 1+ pitting edema lower legs feet and ankles bilaterally  Skin:    Comments: Vesicular rash on both hands which is drying up.  Neurological:     Mental Status: He is alert and oriented to person, place, and time.     Cranial Nerves: No cranial nerve deficit.    BP 120/60 (BP Location: Left Arm, Patient Position: Sitting, Cuff Size: Normal)    Pulse 88    Temp 98.2 F (36.8 C) (Oral)    Wt 168 lb 12.8 oz (76.6 kg)    SpO2 95%    BMI 24.93 kg/m  Wt Readings from Last 3 Encounters:  10/28/21 168 lb  12.8 oz (76.6 kg)  09/30/21 158 lb 9.6 oz (71.9 kg)  09/15/21 156 lb 12.8 oz (71.1 kg)     Health Maintenance Due  Topic Date Due   Zoster Vaccines- Shingrix (1 of 2) Never done   FOOT EXAM  02/08/2018   COVID-19 Vaccine (3 - Pfizer risk series) 01/12/2020   URINE MICROALBUMIN  07/29/2021   OPHTHALMOLOGY EXAM  10/22/2021    There are no preventive care reminders to display for this patient.  Lab Results  Component Value Date   TSH 2.079 08/18/2021   Lab Results  Component Value Date   WBC 9.1 09/15/2021   HGB 13.7 09/15/2021   HCT 41.8 09/15/2021   MCV 81 09/15/2021   PLT 133 (L) 09/15/2021   Lab Results  Component Value Date   NA 135 10/21/2021   K 4.0 10/21/2021   CO2 27 10/21/2021   GLUCOSE 147 (H) 10/21/2021   BUN 26 (H) 10/21/2021   CREATININE 1.56 (H) 10/21/2021   BILITOT 1.5 (H) 04/27/2021   ALKPHOS 97 04/27/2021   AST 14 (L) 04/27/2021   ALT 19 04/27/2021   PROT 6.8 04/27/2021   ALBUMIN 3.4 (L) 04/27/2021   CALCIUM 9.9 10/21/2021   ANIONGAP 8 08/20/2021   EGFR 45 (  L) 09/23/2021   GFR 39.94 (L) 10/21/2021   Lab Results  Component Value Date   CHOL 122 08/19/2021   Lab Results  Component Value Date   HDL 35 (L) 08/19/2021   Lab Results  Component Value Date   LDLCALC 73 08/19/2021   Lab Results  Component Value Date   TRIG 68 08/19/2021   Lab Results  Component Value Date   CHOLHDL 3.5 08/19/2021   Lab Results  Component Value Date   HGBA1C 8.5 (A) 08/26/2021      Assessment & Plan:   #1 recent closed head injury.  He has had some residual headaches but overall slightly improved.  Recent CT head without contrast revealed no bleed.  Avoid daily use of analgesics.  Suspect these will improve over the next couple weeks.  #2 history of congestive heart failure.  Patient is about 10 pounds over his relatively dry weight.  Increased peripheral edema.  Recent BNP level over 1100.  He is currently in no respiratory distress.  He states that  even when he has gone to 60 mg twice daily of furosemide in the past has not generally responded well.  -We discussed stopping his furosemide and try torsemide 20 mg daily. -Continue daily weights -Set up 2-week follow-up.  Check basic metabolic panel at follow-up  #3 chilblains involving both hands.  Appears to be healing uneventfully    Follow-up: Return in about 2 weeks (around 11/11/2021).    Carolann Littler, MD

## 2021-10-29 ENCOUNTER — Other Ambulatory Visit: Payer: Self-pay | Admitting: Internal Medicine

## 2021-10-29 ENCOUNTER — Other Ambulatory Visit: Payer: Self-pay | Admitting: Family Medicine

## 2021-11-09 NOTE — Progress Notes (Signed)
Cardiology Office Note:    Date:  11/17/2021   ID:  [name], DOB March 21, 1933, MRN [id number]  PCP:  Shawn Post, MD   [facility] Providers Cardiologist:  [name], MD Cardiology APP:  Ledora Bottcher, [place] { Cardiology APP: Fabian Sharp, PA-C  Referring MD: Shawn Post, MD   Chief Complaint  Patient presents with   Follow-up    CAB, PAF    History of Present Illness:    [name] is a 88 y.o. male with a hx of CAD s/p CABG and subsequent PCI, PAF, COPD, and AS. He underwent TAVR workup in 2021 with right and left heart catheterization that showed patent LIMA-LAD, SVG-left PLA, and SVG-PDA. He had severe in-stent restenosis of the SVG-OM treated with POBA. He was treated with triple therapy ASA, plavix, and eliquis x 1 month, then plavix and eliquis. He underwent TAVR, but has continued to have chronic chest pain.    He has a history of chronic systolic and diastolic heart failure. Echo 06/04/21 with LVEF 40-45% with LVH, mildly reduced RV fx, mild MR and good TAVR valve function.   He was last seen in clinic by Dr. Percival Spanish 09/15/21 and reportedly was in a wheelchair, he is not able to swallow foods and reported what sounded like hemorrhoids. He reported right sided chest discomfort which has not changed with PCI or TAVR. To this end, he self-discontinued jardiance without improvement in his symptoms. Labs checked with hyponatremia and Dr. Percival Spanish instructed to reduce free water intake and reduce lasix to 20 mg daily for 1 week with repeat BMP, which showed improved Na.   Unfortunately, he gained weight on the reduced lasix regimen. Dr. Elease [name] felt his prior weight loss may have been due to depression. He fell into some brush and his his head and shoulder on a tree. He  could not get up and was on the ground for 2 hrs. Sounds like a mechanical fall, although he does state he is unsteady on his feet from time to time.  He did not seek help but went to PCP  1 week later and was sent for head CT, which was unrevealing. Films ordered by PCP. Due to dyspnea, lasix changed to torsemide on 10/28/21. Labs showed BNP was elevated above 1100 and he increased his torsemide to 20 mg BID x 3 days. He is now back on 20 mg torsemide.   He presents today for follow up. He states he has gone down 3-4 lbs in the last week. He is now taking 20 mg torsemide daily and feels well. I do not think he's volume up. He had been taking torsemide for over 1 week when he had labs on 11/11/21 with stable renal function.    Past Medical History:  Diagnosis Date   Aortic stenosis, moderate 09/20/2020   Arthritis    CAD (coronary artery disease)    a. Cath September 2015 LIMA to the LAD patent, SVG to PDA patent, SVG to posterior lateral patent, SVG to OM with a 90% in-stent restenosis at an anastomotic lesion. This was treated with angioplasty. b. cath 04/01/2015 95% ISR in SVG to OM treated with 2.75x24 Synergy DES postdilated to 3.64m, all other grafts patent   Cataract    surgery,B/L   CHF (congestive heart failure) (HCC)    Chronic kidney disease    nephrolithiasis   Diabetes mellitus    TYPE 2   GERD (gastroesophageal reflux disease)    Hernia  Hypercholesterolemia    Hypertension    Incontinence    hx  over 1 year,leaks without awareness   Other and unspecified diseases of appendix    PAF (paroxysmal atrial fibrillation) ([place])    in the setting of ischemia on 03/31/2015   Pancreatitis    Prostate CA ([place]) 03/01/04   prostate bx=Adenocarcinoam,gleason 3+4=7,PSA=6.75   Shortness of breath dyspnea    Ulcer    hx gastric    Past Surgical History:  Procedure Laterality Date   APPENDECTOMY     BACK SURGERY     CARDIAC CATHETERIZATION N/A 04/01/2015   Procedure: Left Heart Cath and Cors/Grafts Angiography;  Surgeon: Jettie Booze, MD;  Location: [place] [facility];  Service: Cardiovascular;  Laterality: N/A;   CARDIAC CATHETERIZATION N/A 04/01/2015    Procedure: Coronary Stent Intervention;  Surgeon: Jettie Booze, MD;  Location: [place] [facility];  Service: Cardiovascular;  Laterality: N/A;   CARDIOVERSION N/A 11/01/2019   Procedure: CARDIOVERSION;  Surgeon: Geralynn Rile, MD;  Location: [place];  Service: Cardiovascular;  Laterality: N/A;   CARPAL TUNNEL RELEASE  2004   both hands   CORONARY ARTERY BYPASS GRAFT     CORONARY BALLOON ANGIOPLASTY N/A 08/19/2021   Procedure: CORONARY BALLOON ANGIOPLASTY;  Surgeon: Burnell Blanks, MD;  Location: [place] [facility];  Service: Cardiovascular;  Laterality: N/A;   CORONARY STENT INTERVENTION N/A 09/23/2020   Procedure: CORONARY STENT INTERVENTION;  Surgeon: Sherren Mocha, MD;  Location: [place] [facility];  Service: Cardiovascular;  Laterality: N/A;   CYSTOSCOPY  08/23/12   incomplete emoptying bladder   HERNIA REPAIR     incision and drainage of right chest abscess     INTRAOPERATIVE TRANSTHORACIC ECHOCARDIOGRAM Left 04/28/2021   Procedure: INTRAOPERATIVE TRANSTHORACIC ECHOCARDIOGRAM;  Surgeon: Burnell Blanks, MD;  Location: [place];  Service: Open Heart Surgery;  Laterality: Left;   LAPAROSCOPIC CHOLECYSTECTOMY  09/2009   LEFT HEART CATH AND CORS/GRAFTS ANGIOGRAPHY N/A 08/19/2021   Procedure: LEFT HEART CATH AND CORS/GRAFTS ANGIOGRAPHY;  Surgeon: Burnell Blanks, MD;  Location: [place] [facility];  Service: Cardiovascular;  Laterality: N/A;   LEFT HEART CATHETERIZATION WITH CORONARY ANGIOGRAM N/A 07/19/2014   Procedure: LEFT HEART CATHETERIZATION WITH CORONARY ANGIOGRAM;  Surgeon: Leonie Man, MD;  Location: [facility];  Service: Cardiovascular;  Laterality: N/A;   RECTAL SURGERY     RIGHT/LEFT HEART CATH AND CORONARY/GRAFT ANGIOGRAPHY N/A 09/23/2020   Procedure: RIGHT/LEFT HEART CATH AND CORONARY/GRAFT ANGIOGRAPHY;  Surgeon: Sherren Mocha, MD;  Location: [place] [facility];  Service: Cardiovascular;  Laterality: N/A;   ROBOT ASSISTED LAPAROSCOPIC  RADICAL PROSTATECTOMY  2005   TRANSCATHETER AORTIC VALVE REPLACEMENT, TRANSFEMORAL Bilateral 04/28/2021   Procedure: TRANSCATHETER AORTIC VALVE REPLACEMENT, TRANSFEMORAL;  Surgeon: Burnell Blanks, MD;  Location: [place];  Service: Open Heart Surgery;  Laterality: Bilateral;   ULTRASOUND GUIDANCE FOR VASCULAR ACCESS Bilateral 04/28/2021   Procedure: ULTRASOUND GUIDANCE FOR VASCULAR ACCESS;  Surgeon: Burnell Blanks, MD;  Location: [place];  Service: Open Heart Surgery;  Laterality: Bilateral;    Current Medications: Current Meds  Medication Sig   albuterol (VENTOLIN HFA) 108 (90 Base) MCG/ACT inhaler Inhale 2 puffs into the lungs every 6 (six) hours as needed. (Patient taking differently: Inhale 2 puffs into the lungs every 6 (six) hours as needed for wheezing or shortness of breath.)   amLODipine (NORVASC) 5 MG tablet TAKE 1 TABLET BY MOUTH EVERY DAY   apixaban (ELIQUIS) 2.5 MG TABS tablet Take  1 tablet (2.5 mg total) by mouth 2 (two) times daily.   blood glucose meter kit and supplies KIT Dispense based on patient and insurance preference. Use up to four times daily as directed. (FOR ICD-9 250.00, 250.01).   budesonide-formoterol (SYMBICORT) 160-4.5 MCG/ACT inhaler Inhale 2 puffs into the lungs in the morning and at bedtime. (Patient taking differently: Inhale 2 puffs into the lungs 2 (two) times daily as needed (sob/wheezing).)   Carboxymethylcellul-Glycerin (LUBRICATING EYE DROPS OP) Place 1 drop into both eyes daily as needed (dry eyes).   carvedilol (COREG) 6.25 MG tablet TAKE 1 TABLET (6.25 MG TOTAL) BY MOUTH 2 (TWO) TIMES DAILY WITH A MEAL.   clopidogrel (PLAVIX) 75 MG tablet Take 1 tablet (75 mg total) by mouth daily with breakfast.   Cyanocobalamin (VITAMIN B-12 PO) Take 1 tablet by mouth daily.   docusate sodium (STOOL SOFTENER) 100 MG capsule Take 1 capsule (100 mg total) by mouth 2 (two) times daily as needed for mild constipation.   Homeopathic Products [facility] RELIEF  EX) Apply 1 application topically daily as needed (knee pain).   hydrocortisone 2.5 % cream Apply 1 application topically daily as needed (facial breakouts).   Insulin Degludec (TRESIBA) 100 UNIT/ML SOLN Inject 10 Units into the skin daily.   Insulin Pen Needle 32G X 4 MM MISC Use as directed.   JANUVIA 50 MG tablet TAKE 1 TABLET BY MOUTH EVERY DAY   Multiple Vitamins-Minerals (ICAPS AREDS 2 PO) Take 1 capsule by mouth daily.   nitroGLYCERIN (NITROSTAT) 0.4 MG SL tablet PLACE 1 TABLET UNDER THE TONGUE EVERY 5 MINUTES X 3 DOSES AS NEEDED FOR CHEST PAIN   pantoprazole (PROTONIX) 40 MG tablet TAKE 1 TABLET BY MOUTH EVERY DAY   rosuvastatin (CRESTOR) 20 MG tablet TAKE 1 TABLET BY MOUTH EVERY DAY   sertraline (ZOLOFT) 50 MG tablet TAKE 1 TABLET BY MOUTH EVERY DAY   spironolactone (ALDACTONE) 25 MG tablet Take 1 tablet (25 mg total) by mouth daily.   torsemide (DEMADEX) 20 MG tablet Take 1 tablet (20 mg total) by mouth daily.     Allergies:   Codeine and Morphine   Social History   Socioeconomic History   Marital status: Married    Spouse name: Not on file   Number of children: 2   Years of education: Not on file   Highest education level: Not on file  Occupational History    Employer: RETIRED    Comment: Retired  Tobacco Use   Smoking status: Former    Packs/day: 0.50    Years: 5.00    Pack years: 2.50    Types: Cigarettes    Quit date: 07/20/1972    Years since quitting: 49.3   Smokeless tobacco: Never   Tobacco comments:    quit s  Vaping Use   Vaping Use: Never used  Substance and Sexual Activity   Alcohol use: No   Drug use: No    Comment: quit smoking 40 years ago   Sexual activity: Never  Other Topics Concern   Not on file  Social History Narrative   Retired   Married      Social Determinants of Radio broadcast assistant Strain: Low Risk    Difficulty of Paying Living Expenses: Not hard at all  Food Insecurity: No Food Insecurity   Worried About Ship broker in the Last Year: Never true   Arboriculturist in the Last Year: Never true  Transportation Needs: No  Transportation Needs   Lack of Transportation (Medical): No   Lack of Transportation (Non-Medical): No  Physical Activity: Insufficiently Active   Days of Exercise per Week: 3 days   Minutes of Exercise per Session: 30 min  Stress: No Stress Concern Present   Feeling of Stress : Not at all  Social Connections: Socially Integrated   Frequency of Communication with Friends and Family: More than three times a week   Frequency of Social Gatherings with Friends and Family: More than three times a week   Attends Religious Services: More than 4 times per year   Active Member of [employer] or Organizations: Yes   Attends Archivist Meetings: 1 to 4 times per year   Marital Status: Married     Family History: The patient's family history includes Cancer in his mother; Heart attack in his father.  ROS:   Please see the history of present illness.     All other systems reviewed and are negative.  EKGs/Labs/Other Studies Reviewed:    The following studies were reviewed today:  Left heart cath 08/19/21:   LM lesion is 25% stenosed.   Ost LM to Mid LM lesion is 50% stenosed.   Mid LAD lesion is 80% stenosed.   Mid Cx to Dist Cx lesion is 80% stenosed.   Ost RCA to Prox RCA lesion is 100% stenosed.   Origin lesion is 40% stenosed.   RPDA lesion is 30% stenosed.   SVG to OM1 is patent. Dist Graft to Insertion lesion is 95% stenosed.   Balloon angioplasty was performed using a BALLN  EMERGE MR 3.5X12.   [name] intervention, there is a 30% residual stenosis.   LIMA to LAD is patent   SVG to PDA is patent   SVG to OM2 is patent   Severe triple vessel CAD s/p 4V CABG with 4/4 patent grafts Unable to selectively engage the left main artery due to obstruction of the ostium by the TAVR valve stent cage. Based on non-selective angiography the LAD and Circumflex are patent with  anatomy likely unchanged from cath in 2022 but cannot definitively say this. All three grafts to the left system are patent.  Chronic occlusion proximal RCA Patent LIMA to LAD, SVG to OM1, SVG to OM2 and SVG to PDA Severe restenosis in the distal body of SVG to OM1 in the previously stented segment Successful PTCA with balloon angioplasty only of the restenosis within the distal body of the vein graft stented segment   Recommendations: I would plan to d/c home tomorrow with plans for Plavix and Eliquis for 6 months if possible. Stop ASA before discharge.   EKG:  EKG is not ordered today.   Recent Labs: 04/27/2021: ALT 19 08/18/2021: B Natriuretic Peptide 590.2; Magnesium 2.4; TSH 2.079 09/15/2021: Hemoglobin 13.7; Platelets 133 11/11/2021: BUN 21; Creatinine, Ser 1.59; Potassium 4.5; Pro B Natriuretic peptide (BNP) 1,541.0; Sodium 138  Recent Lipid Panel    Component Value Date/Time   CHOL 122 08/19/2021 0234   CHOL 97 (L) 01/01/2021 0848   TRIG 68 08/19/2021 0234   TRIG 306 (HH) 09/27/2006 1154   HDL 35 (L) 08/19/2021 0234   HDL 33 (L) 01/01/2021 0848   CHOLHDL 3.5 08/19/2021 0234   VLDL 14 08/19/2021 0234   LDLCALC 73 08/19/2021 0234   LDLCALC 46 01/01/2021 0848   LDLDIRECT 143.3 11/23/2007 0000     Risk Assessment/Calculations:    CHA2DS2-VASc Score = 6   This indicates a 9.7% annual risk  of stroke. The patient's score is based upon: CHF History: 1 HTN History: 1 Diabetes History: 1 Stroke History: 0 Vascular Disease History: 1 Age Score: 2 Gender Score: 0          Physical Exam:    VS:  BP (!) 128/52    Pulse 75    Ht _0  (1.753 m)    Wt 163 lb 9.6 oz (74.2 kg)    SpO2 95%    BMI 24.16 kg/m     Wt Readings from Last 3 Encounters:  11/17/21 163 lb 9.6 oz (74.2 kg)  11/11/21 164 lb 11.2 oz (74.7 kg)  10/28/21 168 lb 12.8 oz (76.6 kg)     GEN:  Well nourished, well developed in no acute distress HEENT: Normal NECK: No JVD; No carotid bruits LYMPHATICS:  No lymphadenopathy CARDIAC: RRR, no murmurs, rubs, gallops RESPIRATORY:  Clear to auscultation without rales, wheezing or rhonchi  ABDOMEN: Soft, non-tender, non-distended MUSCULOSKELETAL:  No edema; No deformity  SKIN: Warm and dry NEUROLOGIC:  Alert and oriented x 3 PSYCHIATRIC:  Normal affect   ASSESSMENT:    1. Coronary artery disease involving native heart without angina pectoris, unspecified vessel or lesion type   2. Nonrheumatic aortic valve stenosis   3. S/P TAVR (transcatheter aortic valve replacement)   4. Chronic combined systolic and diastolic heart failure (HCC)   5. Persistent atrial fibrillation ([place])   6. Chronic anticoagulation   7. Primary hypertension   8. Stage 3 chronic kidney disease, unspecified whether stage 3a or 3b CKD ([place])   9. Pure hypercholesterolemia    PLAN:    In order of problems listed above:   CAD s/p CABG with most recent PCI in 2021 - continue plavix and eliquis, no ASA - chronic right sided chest pain, does not sound typical for angina    AS s/p TAVR - stable on follow up echo   Chronic combined systolic and diastolic heart failure - LVEF 40-45%, mild RV dysfunction - diruetic regimen complicated by hyponatremia, although low sodium may have been the result of poor PO intake - he has been taking 20 mg torsemide   Persistent atrial fibrillation Chronic anticoagulation - no bleeding problems   Carotid artery disease - needs follow up dopplers in June 2023   Hypertension Continue amlodipine, coreg, spiro   Hyperlipidemia with LDL < 70 08/19/2021: Cholesterol 122; HDL 35; LDL Cholesterol 73; Triglycerides 68; VLDL 14   DM2 A1c has trended up to 8.6% as of 08/2021   CKD stage II-III Has been stable on torsemide.   Follow up in three months with me or Dr. Percival Spanish.    Medication Adjustments/Labs and Tests Ordered: Current medicines are reviewed at length with the patient today.  Concerns regarding medicines are  outlined above.  No orders of the defined types were placed in this encounter.  No orders of the defined types were placed in this encounter.   Patient Instructions  Medication Instructions:  No Changes  *If you need a refill on your cardiac medications before your next appointment, please call your pharmacy*   Lab Work: No Labs If you have labs (blood work) drawn today and your tests are completely normal, you will receive your results only by: [place] (if you have MyChart) OR A paper copy in the mail If you have any lab test that is abnormal or we need to change your treatment, we will call you to review the results.   Testing/Procedures: No Testing  Follow-Up: At [facility], you and your health needs are our priority.  As part of our continuing mission to provide you with exceptional heart care, we have created designated Provider Care Teams.  These Care Teams include your primary Cardiologist (physician) and Advanced Practice Providers (APPs -  Physician Assistants and Nurse Practitioners) who all work together to provide you with the care you need, when you need it.  We recommend signing up for the patient portal called "MyChart".  Sign up information is provided on this After Visit Summary.  MyChart is used to connect with patients for Virtual Visits (Telemedicine).  Patients are able to view lab/test results, encounter notes, upcoming appointments, etc.  Non-urgent messages can be sent to your provider as well.   To learn more about what you can do with MyChart, go to [email or web address].    Your next appointment:   3 month(s)  The format for your next appointment:   In Person  Provider:   Fabian Sharp, PA-C    Then, [name], MD will plan to see you again in 3 month(s).       Signed, Ledora Bottcher, PA  11/17/2021 1:50 PM    [facility]

## 2021-11-11 ENCOUNTER — Ambulatory Visit: Payer: Medicare PPO | Admitting: Family Medicine

## 2021-11-11 VITALS — BP 120/60 | HR 83 | Temp 97.7°F | Wt 164.7 lb

## 2021-11-11 DIAGNOSIS — I5023 Acute on chronic systolic (congestive) heart failure: Secondary | ICD-10-CM | POA: Diagnosis not present

## 2021-11-11 DIAGNOSIS — I10 Essential (primary) hypertension: Secondary | ICD-10-CM

## 2021-11-11 DIAGNOSIS — E1121 Type 2 diabetes mellitus with diabetic nephropathy: Secondary | ICD-10-CM

## 2021-11-11 LAB — BASIC METABOLIC PANEL
BUN: 21 mg/dL (ref 6–23)
CO2: 28 mEq/L (ref 19–32)
Calcium: 10.1 mg/dL (ref 8.4–10.5)
Chloride: 103 mEq/L (ref 96–112)
Creatinine, Ser: 1.59 mg/dL — ABNORMAL HIGH (ref 0.40–1.50)
GFR: 39.02 mL/min — ABNORMAL LOW (ref >60.00)
Glucose, Bld: 153 mg/dL — ABNORMAL HIGH (ref 70–99)
Potassium: 4.5 mEq/L (ref 3.5–5.1)
Sodium: 138 mEq/L (ref 135–145)

## 2021-11-11 LAB — BRAIN NATRIURETIC PEPTIDE: Pro B Natriuretic peptide (BNP): 1541 pg/mL — ABNORMAL HIGH (ref 0.0–100.0)

## 2021-11-11 LAB — HEMOGLOBIN A1C: Hgb A1c MFr Bld: 7.2 % — ABNORMAL HIGH (ref 4.6–6.5)

## 2021-11-11 NOTE — Progress Notes (Signed)
Established Patient Office Visit  Subjective:  Patient ID: [name], male    DOB: [birth year]  Age: 88 y.o. MRN: [id number]  CC:  Chief Complaint  Patient presents with   Follow-up    HPI [name] presents for follow-up regarding recent acute exacerbation of systolic heart failure.  He had a BNP level over 1100.  He had increased peripheral edema and some dyspnea.  He was on fairly high-dose Lasix and we transitioned to torsemide.  He has done well and has lost about 5 pounds.  Much less leg edema.  Still has some chronic dyspnea with activity.  No orthopnea.  He does complain of recent balance issues.  He feels like he has gotten a lot weaker in his lower extremities.  Very sedentary.  Currently not exercising.  We discussed physical therapy and he declines.  Recent fall with head injury.  CAT scan showed no bleed.  He did have some headaches for several weeks but these have finally resolved.  CBGs have been stable.  He is on low-dose insulin.  No hypoglycemia.  CBGs consistently low 100s fasting.  Wt Readings from Last 3 Encounters:  11/11/21 164 lb 11.2 oz (74.7 kg)  10/28/21 168 lb 12.8 oz (76.6 kg)  09/30/21 158 lb 9.6 oz (71.9 kg)     Past Medical History:  Diagnosis Date   Aortic stenosis, moderate 09/20/2020   Arthritis    CAD (coronary artery disease)    a. Cath September 2015 LIMA to the LAD patent, SVG to PDA patent, SVG to posterior lateral patent, SVG to OM with a 90% in-stent restenosis at an anastomotic lesion. This was treated with angioplasty. b. cath 04/01/2015 95% ISR in SVG to OM treated with 2.75x24 Synergy DES postdilated to 3.56mm, all other grafts patent   Cataract    surgery,B/L   CHF (congestive heart failure) (HCC)    Chronic kidney disease    nephrolithiasis   Diabetes mellitus    TYPE 2   GERD (gastroesophageal reflux disease)    Hernia    Hypercholesterolemia    Hypertension    Incontinence    hx  over 1 year,leaks without awareness    Other and unspecified diseases of appendix    PAF (paroxysmal atrial fibrillation) ([place])    in the setting of ischemia on 03/31/2015   Pancreatitis    Prostate CA ([place]) 03/01/04   prostate bx=Adenocarcinoam,gleason 3+4=7,PSA=6.75   Shortness of breath dyspnea    Ulcer    hx gastric    Past Surgical History:  Procedure Laterality Date   APPENDECTOMY     BACK SURGERY     CARDIAC CATHETERIZATION N/A 04/01/2015   Procedure: Left Heart Cath and Cors/Grafts Angiography;  Surgeon: Jettie Booze, MD;  Location: [place] [facility];  Service: Cardiovascular;  Laterality: N/A;   CARDIAC CATHETERIZATION N/A 04/01/2015   Procedure: Coronary Stent Intervention;  Surgeon: Jettie Booze, MD;  Location: [place] [facility];  Service: Cardiovascular;  Laterality: N/A;   CARDIOVERSION N/A 11/01/2019   Procedure: CARDIOVERSION;  Surgeon: Geralynn Rile, MD;  Location: [place];  Service: Cardiovascular;  Laterality: N/A;   CARPAL TUNNEL RELEASE  2004   both hands   CORONARY ARTERY BYPASS GRAFT     CORONARY BALLOON ANGIOPLASTY N/A 08/19/2021   Procedure: CORONARY BALLOON ANGIOPLASTY;  Surgeon: Burnell Blanks, MD;  Location: [place] [facility];  Service: Cardiovascular;  Laterality: N/A;   CORONARY STENT INTERVENTION N/A 09/23/2020  Procedure: CORONARY STENT INTERVENTION;  Surgeon: Sherren Mocha, MD;  Location: [place] [facility];  Service: Cardiovascular;  Laterality: N/A;   CYSTOSCOPY  08/23/12   incomplete emoptying bladder   HERNIA REPAIR     incision and drainage of right chest abscess     INTRAOPERATIVE TRANSTHORACIC ECHOCARDIOGRAM Left 04/28/2021   Procedure: INTRAOPERATIVE TRANSTHORACIC ECHOCARDIOGRAM;  Surgeon: Burnell Blanks, MD;  Location: [place];  Service: Open Heart Surgery;  Laterality: Left;   LAPAROSCOPIC CHOLECYSTECTOMY  09/2009   LEFT HEART CATH AND CORS/GRAFTS ANGIOGRAPHY N/A 08/19/2021   Procedure: LEFT HEART CATH AND CORS/GRAFTS ANGIOGRAPHY;   Surgeon: Burnell Blanks, MD;  Location: [place] [facility];  Service: Cardiovascular;  Laterality: N/A;   LEFT HEART CATHETERIZATION WITH CORONARY ANGIOGRAM N/A 07/19/2014   Procedure: LEFT HEART CATHETERIZATION WITH CORONARY ANGIOGRAM;  Surgeon: Leonie Man, MD;  Location: [facility];  Service: Cardiovascular;  Laterality: N/A;   RECTAL SURGERY     RIGHT/LEFT HEART CATH AND CORONARY/GRAFT ANGIOGRAPHY N/A 09/23/2020   Procedure: RIGHT/LEFT HEART CATH AND CORONARY/GRAFT ANGIOGRAPHY;  Surgeon: Sherren Mocha, MD;  Location: [place] [facility];  Service: Cardiovascular;  Laterality: N/A;   ROBOT ASSISTED LAPAROSCOPIC RADICAL PROSTATECTOMY  2005   TRANSCATHETER AORTIC VALVE REPLACEMENT, TRANSFEMORAL Bilateral 04/28/2021   Procedure: TRANSCATHETER AORTIC VALVE REPLACEMENT, TRANSFEMORAL;  Surgeon: Burnell Blanks, MD;  Location: [place];  Service: Open Heart Surgery;  Laterality: Bilateral;   ULTRASOUND GUIDANCE FOR VASCULAR ACCESS Bilateral 04/28/2021   Procedure: ULTRASOUND GUIDANCE FOR VASCULAR ACCESS;  Surgeon: Burnell Blanks, MD;  Location: [place];  Service: Open Heart Surgery;  Laterality: Bilateral;    Family History  Problem Relation Age of Onset   Cancer Mother        stomach   Heart attack Father     Social History   Socioeconomic History   Marital status: Married    Spouse name: Not on file   Number of children: 2   Years of education: Not on file   Highest education level: Not on file  Occupational History    Employer: RETIRED    Comment: Retired  Tobacco Use   Smoking status: Former    Packs/day: 0.50    Years: 5.00    Pack years: 2.50    Types: Cigarettes    Quit date: 07/20/1972    Years since quitting: 49.3   Smokeless tobacco: Never   Tobacco comments:    quit s  Vaping Use   Vaping Use: Never used  Substance and Sexual Activity   Alcohol use: No   Drug use: No    Comment: quit smoking 40 years ago   Sexual activity: Never  Other  Topics Concern   Not on file  Social History Narrative   Retired   Married      Social Determinants of Radio broadcast assistant Strain: Low Risk    Difficulty of Paying Living Expenses: Not hard at all  Food Insecurity: No Food Insecurity   Worried About Charity fundraiser in the Last Year: Never true   Arboriculturist in the Last Year: Never true  Transportation Needs: No Transportation Needs   Lack of Transportation (Medical): No   Lack of Transportation (Non-Medical): No  Physical Activity: Insufficiently Active   Days of Exercise per Week: 3 days   Minutes of Exercise per Session: 30 min  Stress: No Stress Concern Present   Feeling of Stress : Not at all  Social  Connections: Socially Integrated   Frequency of Communication with Friends and Family: More than three times a week   Frequency of Social Gatherings with Friends and Family: More than three times a week   Attends Religious Services: More than 4 times per year   Active Member of [employer] or Organizations: Yes   Attends Banker Meetings: 1 to 4 times per year   Marital Status: Married  Catering manager Violence: Not At Risk   Fear of Current or Ex-Partner: No   Emotionally Abused: No   Physically Abused: No   Sexually Abused: No    Outpatient Medications Prior to Visit  Medication Sig Dispense Refill   albuterol (VENTOLIN HFA) 108 (90 Base) MCG/ACT inhaler Inhale 2 puffs into the lungs every 6 (six) hours as needed. (Patient taking differently: Inhale 2 puffs into the lungs every 6 (six) hours as needed for wheezing or shortness of breath.) 18 g 5   amLODipine (NORVASC) 5 MG tablet TAKE 1 TABLET BY MOUTH EVERY DAY 90 tablet 3   apixaban (ELIQUIS) 2.5 MG TABS tablet Take 1 tablet (2.5 mg total) by mouth 2 (two) times daily. 180 tablet 1   blood glucose meter kit and supplies KIT Dispense based on patient and insurance preference. Use up to four times daily as directed. (FOR ICD-9 250.00, 250.01). 1 each  0   budesonide-formoterol (SYMBICORT) 160-4.5 MCG/ACT inhaler Inhale 2 puffs into the lungs in the morning and at bedtime. (Patient taking differently: Inhale 2 puffs into the lungs 2 (two) times daily as needed (sob/wheezing).) 1 each 12   Carboxymethylcellul-Glycerin (LUBRICATING EYE DROPS OP) Place 1 drop into both eyes daily as needed (dry eyes).     carvedilol (COREG) 6.25 MG tablet TAKE 1 TABLET (6.25 MG TOTAL) BY MOUTH 2 (TWO) TIMES DAILY WITH A MEAL. 180 tablet 3   clopidogrel (PLAVIX) 75 MG tablet Take 1 tablet (75 mg total) by mouth daily with breakfast. 90 tablet 2   Cyanocobalamin (VITAMIN B-12 PO) Take 1 tablet by mouth daily.     docusate sodium (STOOL SOFTENER) 100 MG capsule Take 1 capsule (100 mg total) by mouth 2 (two) times daily as needed for mild constipation. 30 capsule 0   Homeopathic Products (THERAWORX RELIEF EX) Apply 1 application topically daily as needed (knee pain).     hydrocortisone 2.5 % cream Apply 1 application topically daily as needed (facial breakouts).     Insulin Degludec (TRESIBA) 100 UNIT/ML SOLN Inject 10 Units into the skin daily. 3 mL 0   Insulin Pen Needle 32G X 4 MM MISC Use as directed. 30 each 1   JANUVIA 50 MG tablet TAKE 1 TABLET BY MOUTH EVERY DAY 90 tablet 1   Multiple Vitamins-Minerals (ICAPS AREDS 2 PO) Take 1 capsule by mouth daily.     nitroGLYCERIN (NITROSTAT) 0.4 MG SL tablet PLACE 1 TABLET UNDER THE TONGUE EVERY 5 MINUTES X 3 DOSES AS NEEDED FOR CHEST PAIN 75 tablet 1   pantoprazole (PROTONIX) 40 MG tablet TAKE 1 TABLET BY MOUTH EVERY DAY 90 tablet 1   rosuvastatin (CRESTOR) 20 MG tablet TAKE 1 TABLET BY MOUTH EVERY DAY 90 tablet 0   sertraline (ZOLOFT) 50 MG tablet TAKE 1 TABLET BY MOUTH EVERY DAY 90 tablet 1   spironolactone (ALDACTONE) 25 MG tablet Take 1 tablet (25 mg total) by mouth daily. 90 tablet 3   torsemide (DEMADEX) 20 MG tablet Take 1 tablet (20 mg total) by mouth daily. 30 tablet 5  No facility-administered medications  prior to visit.    Allergies  Allergen Reactions   Codeine Other (See Comments)    Makes him feel goofy   Morphine Other (See Comments)    Nightmares and felt bad    ROS Review of Systems  Constitutional:  Negative for fatigue and unexpected weight change.  Eyes:  Negative for visual disturbance.  Respiratory:  Positive for shortness of breath. Negative for cough, chest tightness and wheezing.   Cardiovascular:  Negative for chest pain, palpitations and leg swelling.  Neurological:  Negative for dizziness, syncope, weakness, light-headedness and headaches.     Objective:    Physical Exam Vitals reviewed.  Constitutional:      Appearance: Normal appearance.  Cardiovascular:     Rate and Rhythm: Normal rate.     Comments: Irregular rhythm consistent with his atrial fibrillation Pulmonary:     Effort: Pulmonary effort is normal.     Comments: Rales bilaterally which were noted last visit as well. Musculoskeletal:     Comments: Lateral leg edema much improved.  He only has trace edema today  Neurological:     General: No focal deficit present.     Mental Status: He is alert.    BP 120/60 (BP Location: Left Arm, Patient Position: Sitting, Cuff Size: Normal)    Pulse 83    Temp 97.7 F (36.5 C) (Oral)    Wt 164 lb 11.2 oz (74.7 kg)    SpO2 94%    BMI 24.32 kg/m  Wt Readings from Last 3 Encounters:  11/11/21 164 lb 11.2 oz (74.7 kg)  10/28/21 168 lb 12.8 oz (76.6 kg)  09/30/21 158 lb 9.6 oz (71.9 kg)     Health Maintenance Due  Topic Date Due   Zoster Vaccines- Shingrix (1 of 2) Never done   FOOT EXAM  02/08/2018   COVID-19 Vaccine (3 - Pfizer risk series) 01/12/2020   URINE MICROALBUMIN  07/29/2021   OPHTHALMOLOGY EXAM  10/22/2021    There are no preventive care reminders to display for this patient.  Lab Results  Component Value Date   TSH 2.079 08/18/2021   Lab Results  Component Value Date   WBC 9.1 09/15/2021   HGB 13.7 09/15/2021   HCT 41.8  09/15/2021   MCV 81 09/15/2021   PLT 133 (L) 09/15/2021   Lab Results  Component Value Date   NA 135 10/21/2021   K 4.0 10/21/2021   CO2 27 10/21/2021   GLUCOSE 147 (H) 10/21/2021   BUN 26 (H) 10/21/2021   CREATININE 1.56 (H) 10/21/2021   BILITOT 1.5 (H) 04/27/2021   ALKPHOS 97 04/27/2021   AST 14 (L) 04/27/2021   ALT 19 04/27/2021   PROT 6.8 04/27/2021   ALBUMIN 3.4 (L) 04/27/2021   CALCIUM 9.9 10/21/2021   ANIONGAP 8 08/20/2021   EGFR 45 (L) 09/23/2021   GFR 39.94 (L) 10/21/2021   Lab Results  Component Value Date   CHOL 122 08/19/2021   Lab Results  Component Value Date   HDL 35 (L) 08/19/2021   Lab Results  Component Value Date   LDLCALC 73 08/19/2021   Lab Results  Component Value Date   TRIG 68 08/19/2021   Lab Results  Component Value Date   CHOLHDL 3.5 08/19/2021   Lab Results  Component Value Date   HGBA1C 8.5 (A) 08/26/2021      Assessment & Plan:   #1 chronic systolic heart failure with recent exacerbation.  We transitioned from furosemide to  torsemide because of increasing edema and weight gain and elevated BNP level over 1100.  He has done well with torsemide.  Leg edema improved and weight down almost 5 pounds. -Continue torsemide 20 mg daily -Continue close monitoring of weights -Recheck BNP and basic metabolic panel today  #2 type 2 diabetes.  Recent poor control.  Home glucose has improved with low-dose insulin.  Recheck A1c today  #3 balance problems with increased risk of falls.  He has generally increased weakness lower extremities especially quadriceps muscles.  We strongly recommend he consider physical therapy but he declines this time.  He will consider alternatives such as home recumbent bike   No orders of the defined types were placed in this encounter.   Follow-up: Return in about 3 months (around 02/09/2022).    Carolann Littler, MD

## 2021-11-17 ENCOUNTER — Ambulatory Visit: Payer: Medicare PPO | Admitting: Physician Assistant

## 2021-11-17 ENCOUNTER — Encounter: Payer: Self-pay | Admitting: Physician Assistant

## 2021-11-17 ENCOUNTER — Other Ambulatory Visit: Payer: Self-pay

## 2021-11-17 VITALS — BP 128/52 | HR 75 | Ht 69.0 in | Wt 163.6 lb

## 2021-11-17 DIAGNOSIS — Z952 Presence of prosthetic heart valve: Secondary | ICD-10-CM

## 2021-11-17 DIAGNOSIS — Z7901 Long term (current) use of anticoagulants: Secondary | ICD-10-CM | POA: Diagnosis not present

## 2021-11-17 DIAGNOSIS — I2510 Atherosclerotic heart disease of native coronary artery without angina pectoris: Secondary | ICD-10-CM | POA: Diagnosis not present

## 2021-11-17 DIAGNOSIS — E7800 Pure hypercholesterolemia, unspecified: Secondary | ICD-10-CM

## 2021-11-17 DIAGNOSIS — I10 Essential (primary) hypertension: Secondary | ICD-10-CM

## 2021-11-17 DIAGNOSIS — I5042 Chronic combined systolic (congestive) and diastolic (congestive) heart failure: Secondary | ICD-10-CM | POA: Diagnosis not present

## 2021-11-17 DIAGNOSIS — N183 Chronic kidney disease, stage 3 unspecified: Secondary | ICD-10-CM | POA: Diagnosis not present

## 2021-11-17 DIAGNOSIS — I350 Nonrheumatic aortic (valve) stenosis: Secondary | ICD-10-CM | POA: Diagnosis not present

## 2021-11-17 DIAGNOSIS — I4819 Other persistent atrial fibrillation: Secondary | ICD-10-CM | POA: Diagnosis not present

## 2021-11-17 NOTE — Patient Instructions (Signed)
Medication Instructions:  No Changes  *If you need a refill on your cardiac medications before your next appointment, please call your pharmacy*   Lab Work: No Labs If you have labs (blood work) drawn today and your tests are completely normal, you will receive your results only by: [place] (if you have MyChart) OR A paper copy in the mail If you have any lab test that is abnormal or we need to change your treatment, we will call you to review the results.   Testing/Procedures: No Testing    Follow-Up: At [facility], you and your health needs are our priority.  As part of our continuing mission to provide you with exceptional heart care, we have created designated Provider Care Teams.  These Care Teams include your primary Cardiologist (physician) and Advanced Practice Providers (APPs -  Physician Assistants and Nurse Practitioners) who all work together to provide you with the care you need, when you need it.  We recommend signing up for the patient portal called "MyChart".  Sign up information is provided on this After Visit Summary.  MyChart is used to connect with patients for Virtual Visits (Telemedicine).  Patients are able to view lab/test results, encounter notes, upcoming appointments, etc.  Non-urgent messages can be sent to your provider as well.   To learn more about what you can do with MyChart, go to [email or web address].    Your next appointment:   3 month(s)  The format for your next appointment:   In Person  Provider:   Fabian Sharp, PA-C    Then, [name], MD will plan to see you again in 3 month(s).

## 2021-11-28 ENCOUNTER — Other Ambulatory Visit: Payer: Self-pay | Admitting: Cardiology

## 2021-11-30 ENCOUNTER — Other Ambulatory Visit: Payer: Self-pay | Admitting: Family Medicine

## 2021-11-30 NOTE — Telephone Encounter (Signed)
Prescription refill request for Eliquis received. Indication: afib  Last office visit: [facility], 11/17/2021 Scr: 1.59, 11/11/2021 Age: 88 yo  Weight: 74.2 kg   Refill sent

## 2021-12-01 ENCOUNTER — Ambulatory Visit: Payer: Medicare PPO | Admitting: Family Medicine

## 2021-12-03 IMAGING — CR DG CHEST 2V
2 series · 2 of 2 positions shown · non-contrast
Comparison: 09/20/2020

CLINICAL DATA: Severe aortic stenosis, preoperative assessment,
history coronary artery disease, CHF, hypertension, former smoker

EXAM:
CHEST - 2 VIEW

[chest lat]
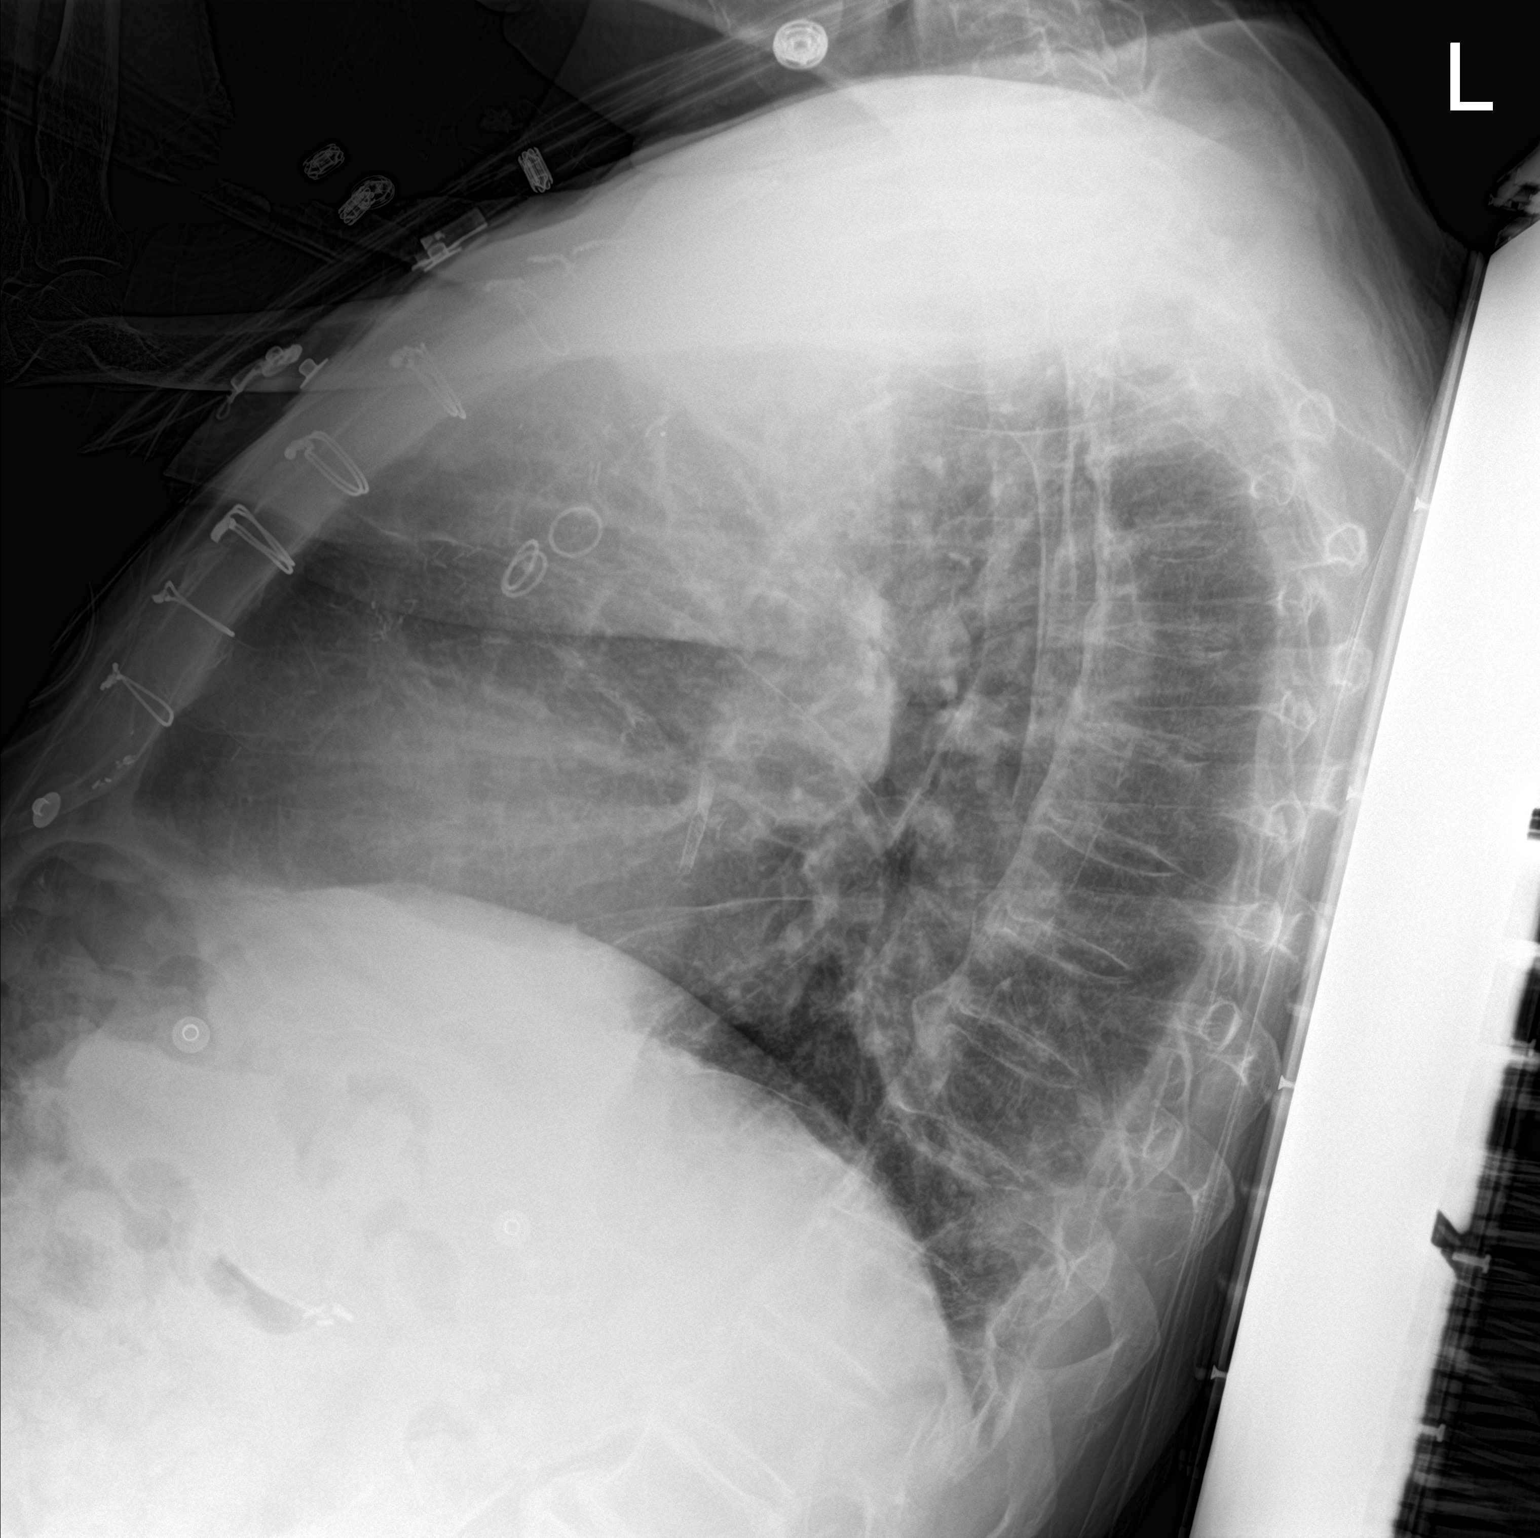

[chest ap]
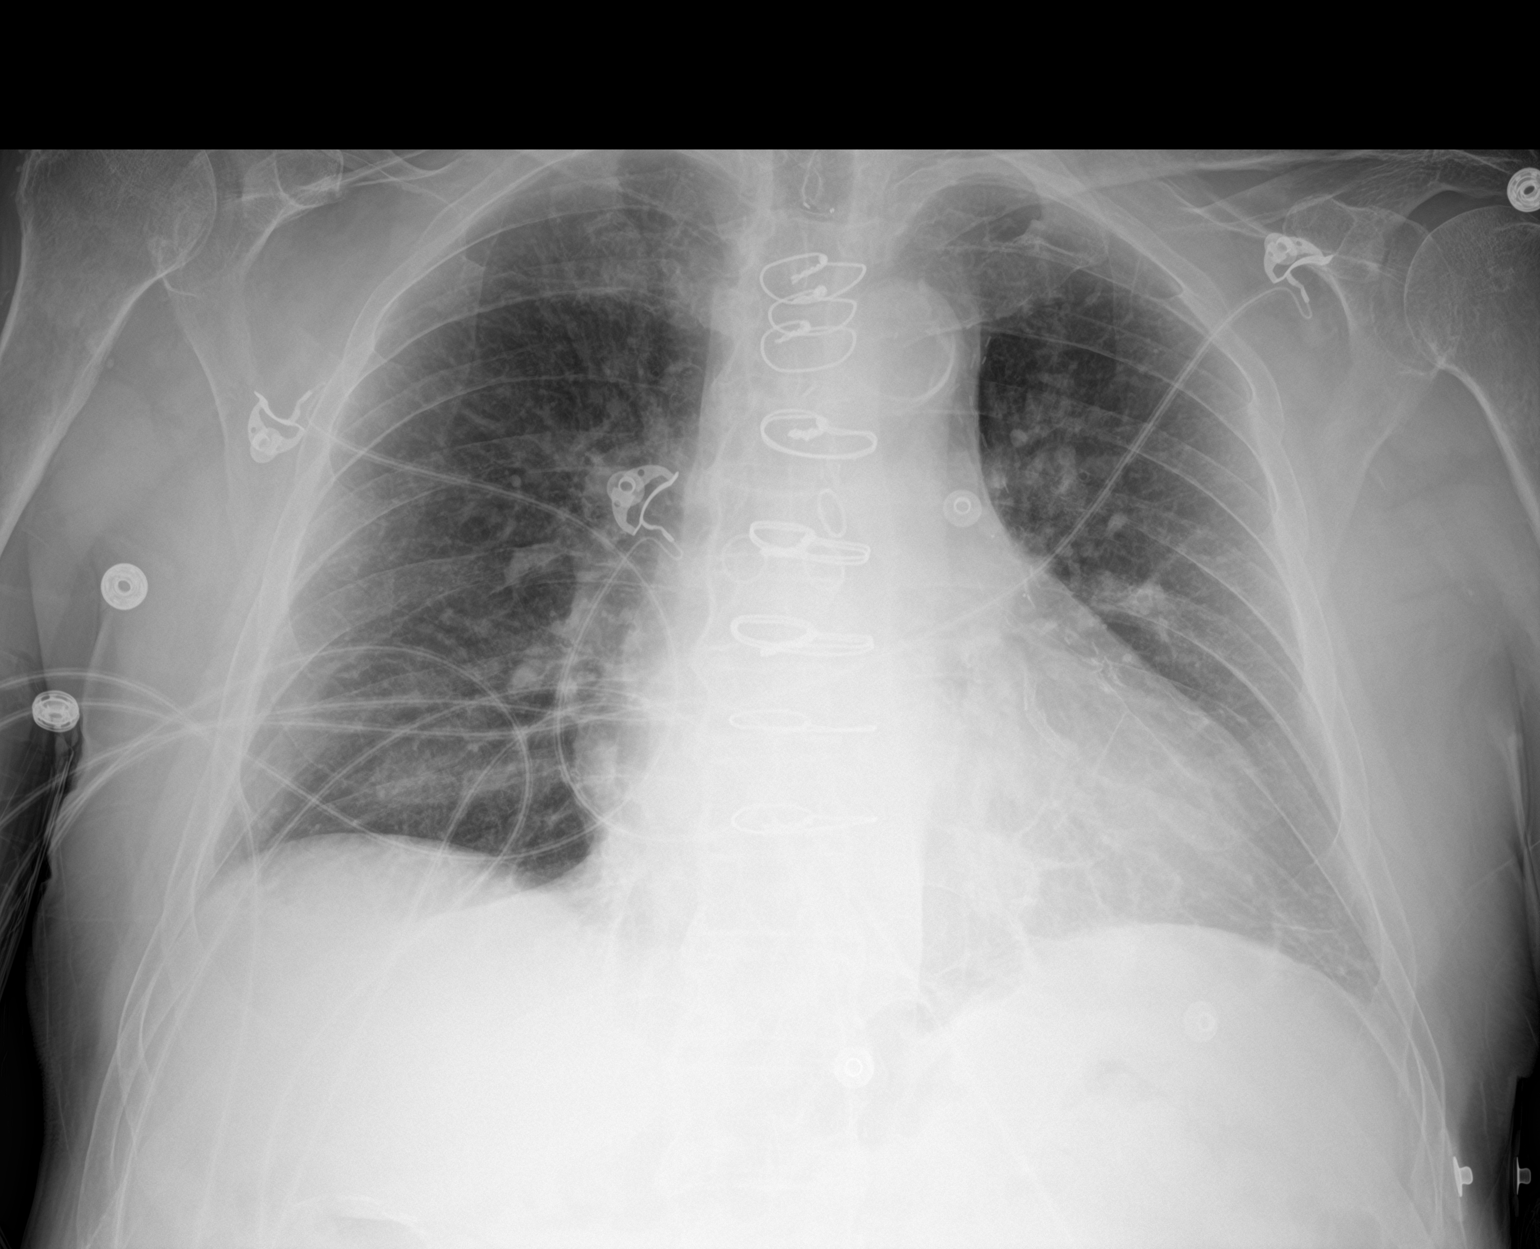

[2 of 2 positions shown; findings below may reference images not displayed]

FINDINGS: Enlargement of cardiac silhouette post CABG and coronary stenting.

Mediastinal contours and pulmonary vascularity normal.

Atherosclerotic calcification aorta.

Chronic LEFT basilar atelectasis and scarring, including an area of
nodular opacity at the medial LEFT lung base, unchanged since CT
exam of 03/26/2021 as well as an earlier CT of 02/20/2019.

No acute infiltrate, pleural effusion or pneumothorax.

Bones demineralized.
IMPRESSION: Enlargement of cardiac silhouette post CABG and coronary stenting.

LEFT basilar scarring including an area of chronic nodularity at
LEFT lower lobe unchanged since 1515.

No acute abnormalities.

Aortic Atherosclerosis ([id number]).

## 2021-12-04 IMAGING — DX DG CHEST 1V PORT
1 series · 1 of 1 positions shown · non-contrast
Comparison: April 27, 2021.

CLINICAL DATA: Atelectasis.

EXAM:
PORTABLE CHEST 1 VIEW

[chest]
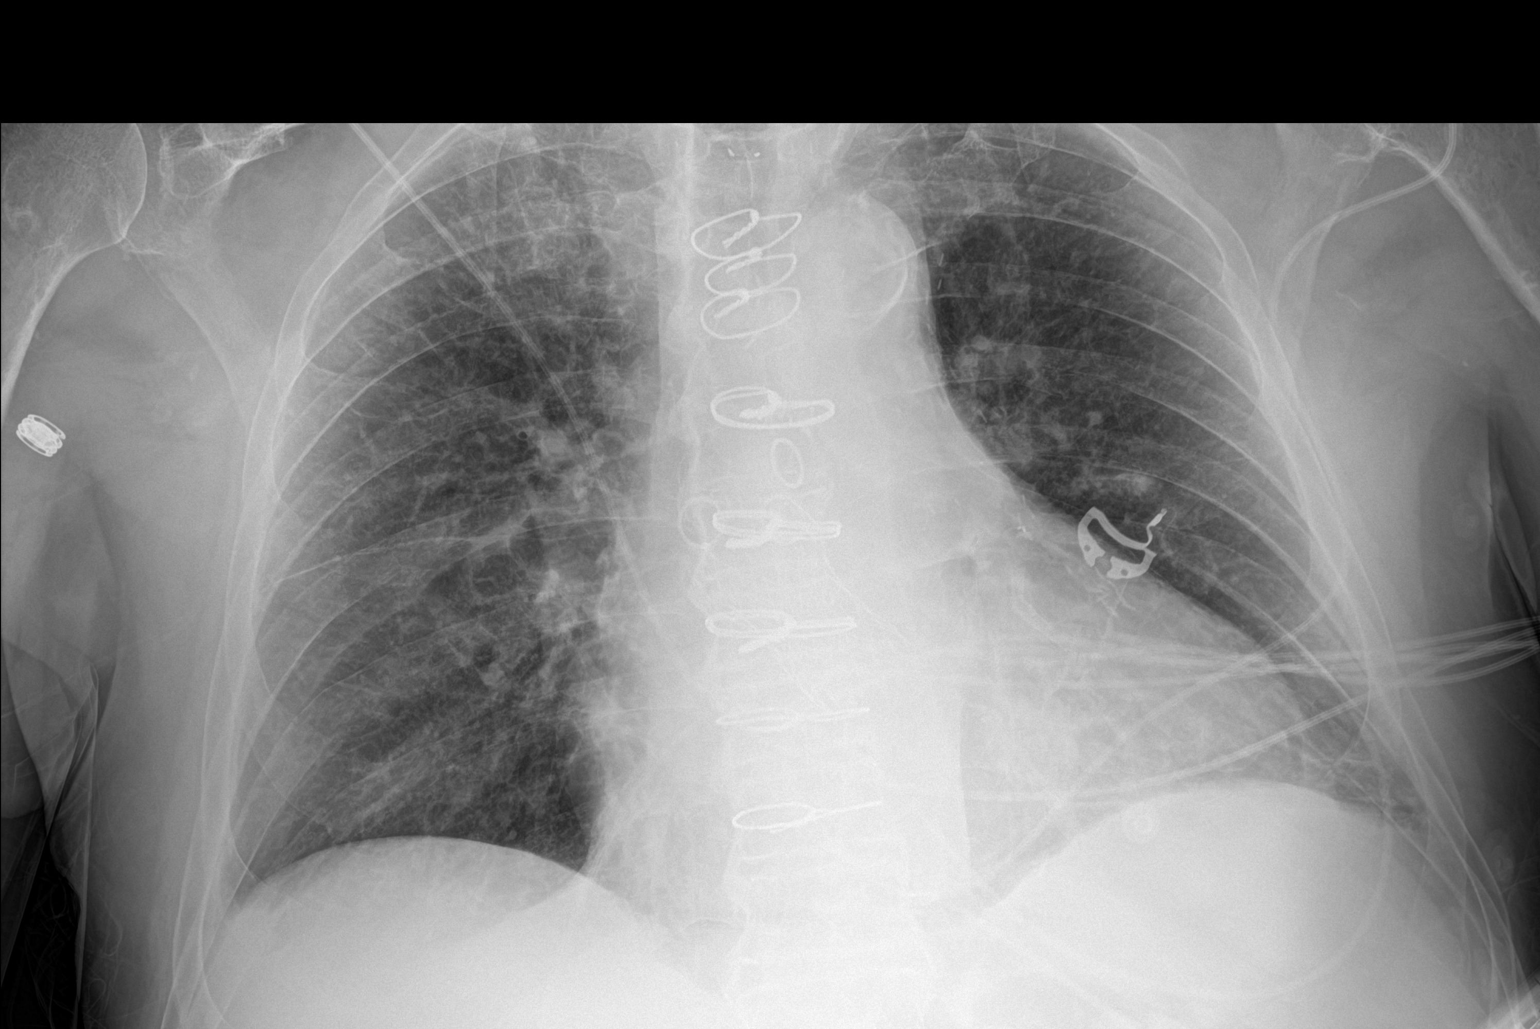

[1 of 1 positions shown; findings below may reference images not displayed]

FINDINGS: Similar enlarged cardiac silhouette with postsurgical changes of
CABG and coronary artery stenting. Pulmonary vascular congestion.
Median sternotomy. Calcific atherosclerosis of the aorta. No
substantial change in bibasilar opacities, including nodular opacity
at the left lung base.
IMPRESSION: 1. No substantial change in suspected bibasilar
atelectasis/scarring, including an area chronic nodularity at the
left lung base that was better characterized on recent CT chest.
2. Similar cardiomegaly and pulmonary vascular congestion.

## 2021-12-28 ENCOUNTER — Other Ambulatory Visit: Payer: Self-pay | Admitting: Physician Assistant

## 2021-12-28 DIAGNOSIS — Z952 Presence of prosthetic heart valve: Secondary | ICD-10-CM

## 2021-12-28 DIAGNOSIS — I5042 Chronic combined systolic (congestive) and diastolic (congestive) heart failure: Secondary | ICD-10-CM

## 2021-12-30 ENCOUNTER — Other Ambulatory Visit: Payer: Self-pay | Admitting: Family Medicine

## 2021-12-30 ENCOUNTER — Other Ambulatory Visit: Payer: Self-pay | Admitting: Cardiology

## 2022-01-11 ENCOUNTER — Other Ambulatory Visit: Payer: Self-pay | Admitting: Family Medicine

## 2022-01-11 ENCOUNTER — Telehealth: Payer: Self-pay | Admitting: Family Medicine

## 2022-01-11 MED ORDER — TORSEMIDE 20 MG PO TABS: 20.0000 mg | ORAL_TABLET | Freq: Every day | ORAL | 2 refills | Status: DC

## 2022-01-11 NOTE — Telephone Encounter (Signed)
Rx sent and pt's spouse aware ?

## 2022-01-11 NOTE — Telephone Encounter (Signed)
Patient spouse [name] is calling in requesting a medication refill for torsemide (DEMADEX) 20 MG tablet [[id number]] to be sent to the pharmacy. ? ?[name] would like to be contacted at [phone]. ? ?Please advise. ?

## 2022-01-27 ENCOUNTER — Ambulatory Visit: Payer: Medicare PPO | Admitting: Family Medicine

## 2022-01-29 ENCOUNTER — Other Ambulatory Visit: Payer: Self-pay | Admitting: Cardiology

## 2022-02-01 ENCOUNTER — Ambulatory Visit: Payer: Medicare PPO | Admitting: Family Medicine

## 2022-02-01 ENCOUNTER — Encounter: Payer: Self-pay | Admitting: Family Medicine

## 2022-02-01 ENCOUNTER — Ambulatory Visit (INDEPENDENT_AMBULATORY_CARE_PROVIDER_SITE_OTHER): Payer: Medicare PPO

## 2022-02-01 VITALS — BP 132/66 | HR 78 | Temp 97.5°F | Ht 69.0 in | Wt 175.8 lb

## 2022-02-01 DIAGNOSIS — R202 Paresthesia of skin: Secondary | ICD-10-CM

## 2022-02-01 DIAGNOSIS — E1121 Type 2 diabetes mellitus with diabetic nephropathy: Secondary | ICD-10-CM | POA: Diagnosis not present

## 2022-02-01 DIAGNOSIS — R0600 Dyspnea, unspecified: Secondary | ICD-10-CM

## 2022-02-01 DIAGNOSIS — I5023 Acute on chronic systolic (congestive) heart failure: Secondary | ICD-10-CM

## 2022-02-01 DIAGNOSIS — J90 Pleural effusion, not elsewhere classified: Secondary | ICD-10-CM | POA: Diagnosis not present

## 2022-02-01 DIAGNOSIS — I509 Heart failure, unspecified: Secondary | ICD-10-CM | POA: Diagnosis not present

## 2022-02-01 LAB — VITAMIN B12: Vitamin B-12: >1504 pg/mL — ABNORMAL HIGH (ref 211–911)

## 2022-02-01 LAB — BASIC METABOLIC PANEL
BUN: 23 mg/dL (ref 6–23)
CO2: 27 mEq/L (ref 19–32)
Calcium: 9.9 mg/dL (ref 8.4–10.5)
Chloride: 103 mEq/L (ref 96–112)
Creatinine, Ser: 1.58 mg/dL — ABNORMAL HIGH (ref 0.40–1.50)
GFR: 39.25 mL/min — ABNORMAL LOW (ref >60.00)
Glucose, Bld: 144 mg/dL — ABNORMAL HIGH (ref 70–99)
Potassium: 4.5 mEq/L (ref 3.5–5.1)
Sodium: 138 mEq/L (ref 135–145)

## 2022-02-01 LAB — BRAIN NATRIURETIC PEPTIDE: Pro B Natriuretic peptide (BNP): 1846 pg/mL — ABNORMAL HIGH (ref 0.0–100.0)

## 2022-02-01 MED ORDER — POTASSIUM CHLORIDE ER 10 MEQ PO TBCR: 10.0000 meq | EXTENDED_RELEASE_TABLET | Freq: Every day | ORAL | 5 refills | Status: DC

## 2022-02-01 NOTE — Progress Notes (Signed)
? ?Established Patient Office Visit ? ?Subjective:  ?Patient ID: [name], male    DOB: 1933-10-11  Age: 88 y.o. MRN: [id number] ? ?CC:  ?Chief Complaint  ?Patient presents with  ? Follow-up  ? ? ?HPI ?[name] presents for medical follow-up.  He has multiple medical problems include history of CAD, history of aortic stenosis, systolic heart failure, history of A-fib, hypertension, COPD, GERD, type 2 diabetes with neuropathy, chronic kidney disease, history of prostate cancer, history of transcatheter aortic valve replacement.  Seen today with chief complaint of increased dyspnea especially over the past month.  He has had some increased dyspnea really for couple years at least now.  Was not clear initially whether this was predominantly pulmonary versus cardiac.  He did have elevated BNP level over 1500 back in January.  Was changed to torsemide and improved symptomatically.  His weight is up roughly 12 pounds from late January.  Recent increase in orthopnea and increased dyspnea with minimal activity. ? ?Does bring in some recent labs from the [place] done in early March.  Kidney function stable with creatinine 1.64.  Potassium and sodium are normal.  A1c improved to 6.4%.  Hemoglobin stable 11.4.  Increase microalbumin creatinine 522 ? ?Echocardiogram 8/22 ejection fraction 40 to 45%.  History of borderline low B12 in the past.  Not checked in a couple years.  He has some paresthesias which may be related to his diabetes.  Does take chronic PPI.  Is on oral B12 replacement. ? ?Medications reviewed.  Currently on torsemide 20 mg daily and also takes Aldactone 25 mg daily. ? ?Diabetes has been well controlled with low-dose insulin.  He is currently on Tresiba 10 units daily.  No recent hypoglycemic episodes. ? ?Past Medical History:  ?Diagnosis Date  ? Aortic stenosis, moderate 09/20/2020  ? Arthritis   ? CAD (coronary artery disease)   ? a. Cath September 2015 LIMA to the LAD patent, SVG to PDA patent, SVG to  posterior lateral patent, SVG to OM with a 90% in-stent restenosis at an anastomotic lesion. This was treated with angioplasty. b. cath 04/01/2015 95% ISR in SVG to OM treated with 2.75x24 Synergy DES postdilated to 3.107m, all other grafts patent  ? Cataract   ? surgery,B/L  ? CHF (congestive heart failure) ([facility]   ? Chronic kidney disease   ? nephrolithiasis  ? Diabetes mellitus   ? TYPE 2  ? GERD (gastroesophageal reflux disease)   ? Hernia   ? Hypercholesterolemia   ? Hypertension   ? Incontinence   ? hx  over 1 year,leaks without awareness  ? Other and unspecified diseases of appendix   ? PAF (paroxysmal atrial fibrillation) ([facility]   ? in the setting of ischemia on 03/31/2015  ? Pancreatitis   ? Prostate [place] ([facility] 03/01/04  ? prostate bx=Adenocarcinoam,gleason 3+4=7,PSA=6.75  ? Shortness of breath dyspnea   ? Ulcer   ? hx gastric  ? ? ?Past Surgical History:  ?Procedure Laterality Date  ? APPENDECTOMY    ? BACK SURGERY    ? CARDIAC CATHETERIZATION N/A 04/01/2015  ? Procedure: Left Heart Cath and Cors/Grafts Angiography;  Surgeon: JJettie Booze MD;  [facility];  Service: Cardiovascular;  Laterality: N/A;  ? CARDIAC CATHETERIZATION N/A 04/01/2015  ? Procedure: Coronary Stent Intervention;  Surgeon: JJettie Booze MD;  [facility];  Service: Cardiovascular;  Laterality: N/A;  ? CARDIOVERSION N/A 11/01/2019  ? Procedure: CARDIOVERSION;  Surgeon: OEleonore Chiquito  Marcello Moores, MD;  Location: [place];  Service: Cardiovascular;  Laterality: N/A;  ? CARPAL TUNNEL RELEASE  2004  ? both hands  ? CORONARY ARTERY BYPASS GRAFT    ? CORONARY BALLOON ANGIOPLASTY N/A 08/19/2021  ? Procedure: CORONARY BALLOON ANGIOPLASTY;  Surgeon: Burnell Blanks, MD;  Location: [place] [facility];  Service: Cardiovascular;  Laterality: N/A;  ? CORONARY STENT INTERVENTION N/A 09/23/2020  ? Procedure: CORONARY STENT INTERVENTION;  Surgeon: Sherren Mocha, MD;  Location: [place] [facility];  Service:  Cardiovascular;  Laterality: N/A;  ? CYSTOSCOPY  08/23/12  ? incomplete emoptying bladder  ? HERNIA REPAIR    ? incision and drainage of right chest abscess    ? INTRAOPERATIVE TRANSTHORACIC ECHOCARDIOGRAM Left 04/28/2021  ? Procedure: INTRAOPERATIVE TRANSTHORACIC ECHOCARDIOGRAM;  Surgeon: Burnell Blanks, MD;  Location: [place];  Service: Open Heart Surgery;  Laterality: Left;  ? LAPAROSCOPIC CHOLECYSTECTOMY  09/2009  ? LEFT HEART CATH AND CORS/GRAFTS ANGIOGRAPHY N/A 08/19/2021  ? Procedure: LEFT HEART CATH AND CORS/GRAFTS ANGIOGRAPHY;  Surgeon: Burnell Blanks, MD;  Location: [place] [facility];  Service: Cardiovascular;  Laterality: N/A;  ? LEFT HEART CATHETERIZATION WITH CORONARY ANGIOGRAM N/A 07/19/2014  ? Procedure: LEFT HEART CATHETERIZATION WITH CORONARY ANGIOGRAM;  Surgeon: Leonie Man, MD;  Location[facility];  Service: Cardiovascular;  Laterality: N/A;  ? RECTAL SURGERY    ? RIGHT/LEFT HEART CATH AND CORONARY/GRAFT ANGIOGRAPHY N/A 09/23/2020  ? Procedure: RIGHT/LEFT HEART CATH AND CORONARY/GRAFT ANGIOGRAPHY;  Surgeon: Sherren Mocha, MD;  Location: [place] [facility];  Service: Cardiovascular;  Laterality: N/A;  ? ROBOT ASSISTED LAPAROSCOPIC RADICAL PROSTATECTOMY  2005  ? TRANSCATHETER AORTIC VALVE REPLACEMENT, TRANSFEMORAL Bilateral 04/28/2021  ? Procedure: TRANSCATHETER AORTIC VALVE REPLACEMENT, TRANSFEMORAL;  Surgeon: Burnell Blanks, MD;  Location: [place];  Service: Open Heart Surgery;  Laterality: Bilateral;  ? ULTRASOUND GUIDANCE FOR VASCULAR ACCESS Bilateral 04/28/2021  ? Procedure: ULTRASOUND GUIDANCE FOR VASCULAR ACCESS;  Surgeon: Burnell Blanks, MD;  Location: [place];  Service: Open Heart Surgery;  Laterality: Bilateral;  ? ? ?Family History  ?Problem Relation Age of Onset  ? Cancer Mother   ?     stomach  ? Heart attack Father   ? ? ?Social History  ? ?Socioeconomic History  ? Marital status: Married  ?  Spouse name: Not on file  ? Number of children: 2  ? Years of  education: Not on file  ? Highest education level: Not on file  ?Occupational History  ?  Employer: RETIRED  ?  Comment: Retired  ?Tobacco Use  ? Smoking status: Former  ?  Packs/day: 0.50  ?  Years: 5.00  ?  Pack years: 2.50  ?  Types: Cigarettes  ?  Quit date: 07/20/1972  ?  Years since quitting: 49.5  ? Smokeless tobacco: Never  ? Tobacco comments:  ?  quit s  ?Vaping Use  ? Vaping Use: Never used  ?Substance and Sexual Activity  ? Alcohol use: No  ? Drug use: No  ?  Comment: quit smoking 40 years ago  ? Sexual activity: Never  ?Other Topics Concern  ? Not on file  ?Social History Narrative  ? Retired  ? Married  ?   ? ?Social Determinants of Health  ? ?Financial Resource Strain: Low Risk   ? Difficulty of Paying Living Expenses: Not hard at all  ?Food Insecurity: No Food Insecurity  ? Worried About Charity fundraiser in the Last Year: Never true  ?  Ran Out of Food in the Last Year: Never true  ?Transportation Needs: No Transportation Needs  ? Lack of Transportation (Medical): No  ? Lack of Transportation (Non-Medical): No  ?Physical Activity: Insufficiently Active  ? Days of Exercise per Week: 3 days  ? Minutes of Exercise per Session: 30 min  ?Stress: No Stress Concern Present  ? Feeling of Stress : Not at all  ?Social Connections: Socially Integrated  ? Frequency of Communication with Friends and Family: More than three times a week  ? Frequency of Social Gatherings with Friends and Family: More than three times a week  ? Attends Religious Services: More than 4 times per year  ? Active Member of Clubs or Organizations: Yes  ? Attends Archivist Meetings: 1 to 4 times per year  ? Marital Status: Married  ?Intimate Partner Violence: Not At Risk  ? Fear of Current or Ex-Partner: No  ? Emotionally Abused: No  ? Physically Abused: No  ? Sexually Abused: No  ? ? ?Outpatient Medications Prior to Visit  ?Medication Sig Dispense Refill  ? albuterol (VENTOLIN HFA) 108 (90 Base) MCG/ACT inhaler Inhale 2 puffs  into the lungs every 6 (six) hours as needed. (Patient taking differently: Inhale 2 puffs into the lungs every 6 (six) hours as needed for wheezing or shortness of breath.) 18 g 5  ? amLODipine (NORVASC) 5

## 2022-02-01 NOTE — Patient Instructions (Signed)
INCREASE THE TORSEMIDE TO TWO TABLETS ONCE DAILY ?

## 2022-02-05 ENCOUNTER — Inpatient Hospital Stay (HOSPITAL_COMMUNITY)
Admission: EM | Admit: 2022-02-05 | Discharge: 2022-02-08 | DRG: 291 | Disposition: A | Discharge status: Home Health | Payer: Medicare PPO | Attending: Internal Medicine | Admitting: Internal Medicine

## 2022-02-05 ENCOUNTER — Telehealth: Payer: Self-pay | Admitting: Family Medicine

## 2022-02-05 ENCOUNTER — Emergency Department (HOSPITAL_COMMUNITY): Payer: Medicare PPO

## 2022-02-05 ENCOUNTER — Other Ambulatory Visit: Payer: Self-pay

## 2022-02-05 ENCOUNTER — Encounter (HOSPITAL_COMMUNITY): Payer: Self-pay

## 2022-02-05 DIAGNOSIS — E1142 Type 2 diabetes mellitus with diabetic polyneuropathy: Secondary | ICD-10-CM | POA: Diagnosis present

## 2022-02-05 DIAGNOSIS — E7800 Pure hypercholesterolemia, unspecified: Secondary | ICD-10-CM | POA: Diagnosis present

## 2022-02-05 DIAGNOSIS — D631 Anemia in chronic kidney disease: Secondary | ICD-10-CM | POA: Diagnosis present

## 2022-02-05 DIAGNOSIS — Z7902 Long term (current) use of antithrombotics/antiplatelets: Secondary | ICD-10-CM | POA: Diagnosis not present

## 2022-02-05 DIAGNOSIS — Z794 Long term (current) use of insulin: Secondary | ICD-10-CM | POA: Diagnosis not present

## 2022-02-05 DIAGNOSIS — I110 Hypertensive heart disease with heart failure: Secondary | ICD-10-CM | POA: Diagnosis not present

## 2022-02-05 DIAGNOSIS — I4821 Permanent atrial fibrillation: Secondary | ICD-10-CM | POA: Diagnosis present

## 2022-02-05 DIAGNOSIS — J9811 Atelectasis: Secondary | ICD-10-CM | POA: Diagnosis not present

## 2022-02-05 DIAGNOSIS — E871 Hypo-osmolality and hyponatremia: Secondary | ICD-10-CM | POA: Diagnosis not present

## 2022-02-05 DIAGNOSIS — F32A Depression, unspecified: Secondary | ICD-10-CM | POA: Diagnosis present

## 2022-02-05 DIAGNOSIS — Y712 Prosthetic and other implants, materials and accessory cardiovascular devices associated with adverse incidents: Secondary | ICD-10-CM | POA: Diagnosis present

## 2022-02-05 DIAGNOSIS — I2510 Atherosclerotic heart disease of native coronary artery without angina pectoris: Secondary | ICD-10-CM | POA: Diagnosis present

## 2022-02-05 DIAGNOSIS — I5021 Acute systolic (congestive) heart failure: Secondary | ICD-10-CM | POA: Diagnosis not present

## 2022-02-05 DIAGNOSIS — Z87891 Personal history of nicotine dependence: Secondary | ICD-10-CM

## 2022-02-05 DIAGNOSIS — I5023 Acute on chronic systolic (congestive) heart failure: Secondary | ICD-10-CM | POA: Diagnosis not present

## 2022-02-05 DIAGNOSIS — F419 Anxiety disorder, unspecified: Secondary | ICD-10-CM | POA: Diagnosis present

## 2022-02-05 DIAGNOSIS — Z7951 Long term (current) use of inhaled steroids: Secondary | ICD-10-CM

## 2022-02-05 DIAGNOSIS — M1990 Unspecified osteoarthritis, unspecified site: Secondary | ICD-10-CM | POA: Diagnosis present

## 2022-02-05 DIAGNOSIS — E785 Hyperlipidemia, unspecified: Secondary | ICD-10-CM | POA: Diagnosis not present

## 2022-02-05 DIAGNOSIS — Z9221 Personal history of antineoplastic chemotherapy: Secondary | ICD-10-CM

## 2022-02-05 DIAGNOSIS — Z7901 Long term (current) use of anticoagulants: Secondary | ICD-10-CM | POA: Diagnosis not present

## 2022-02-05 DIAGNOSIS — I10 Essential (primary) hypertension: Secondary | ICD-10-CM | POA: Diagnosis not present

## 2022-02-05 DIAGNOSIS — D509 Iron deficiency anemia, unspecified: Secondary | ICD-10-CM | POA: Diagnosis not present

## 2022-02-05 DIAGNOSIS — Z955 Presence of coronary angioplasty implant and graft: Secondary | ICD-10-CM | POA: Diagnosis not present

## 2022-02-05 DIAGNOSIS — R0602 Shortness of breath: Secondary | ICD-10-CM | POA: Diagnosis not present

## 2022-02-05 DIAGNOSIS — E1169 Type 2 diabetes mellitus with other specified complication: Secondary | ICD-10-CM | POA: Diagnosis present

## 2022-02-05 DIAGNOSIS — J449 Chronic obstructive pulmonary disease, unspecified: Secondary | ICD-10-CM | POA: Diagnosis present

## 2022-02-05 DIAGNOSIS — I130 Hypertensive heart and chronic kidney disease with heart failure and stage 1 through stage 4 chronic kidney disease, or unspecified chronic kidney disease: Secondary | ICD-10-CM | POA: Diagnosis not present

## 2022-02-05 DIAGNOSIS — D649 Anemia, unspecified: Secondary | ICD-10-CM | POA: Diagnosis present

## 2022-02-05 DIAGNOSIS — I5043 Acute on chronic combined systolic (congestive) and diastolic (congestive) heart failure: Secondary | ICD-10-CM | POA: Diagnosis not present

## 2022-02-05 DIAGNOSIS — Z952 Presence of prosthetic heart valve: Secondary | ICD-10-CM | POA: Diagnosis not present

## 2022-02-05 DIAGNOSIS — I447 Left bundle-branch block, unspecified: Secondary | ICD-10-CM | POA: Diagnosis present

## 2022-02-05 DIAGNOSIS — Z951 Presence of aortocoronary bypass graft: Secondary | ICD-10-CM

## 2022-02-05 DIAGNOSIS — Z79899 Other long term (current) drug therapy: Secondary | ICD-10-CM | POA: Diagnosis not present

## 2022-02-05 DIAGNOSIS — E1165 Type 2 diabetes mellitus with hyperglycemia: Secondary | ICD-10-CM | POA: Diagnosis present

## 2022-02-05 DIAGNOSIS — Z885 Allergy status to narcotic agent status: Secondary | ICD-10-CM | POA: Diagnosis not present

## 2022-02-05 DIAGNOSIS — N1832 Chronic kidney disease, stage 3b: Secondary | ICD-10-CM | POA: Diagnosis present

## 2022-02-05 DIAGNOSIS — R7989 Other specified abnormal findings of blood chemistry: Secondary | ICD-10-CM | POA: Diagnosis not present

## 2022-02-05 DIAGNOSIS — Z8249 Family history of ischemic heart disease and other diseases of the circulatory system: Secondary | ICD-10-CM

## 2022-02-05 DIAGNOSIS — K2100 Gastro-esophageal reflux disease with esophagitis, without bleeding: Secondary | ICD-10-CM | POA: Diagnosis present

## 2022-02-05 DIAGNOSIS — T8203XA Leakage of heart valve prosthesis, initial encounter: Secondary | ICD-10-CM | POA: Diagnosis present

## 2022-02-05 DIAGNOSIS — E1122 Type 2 diabetes mellitus with diabetic chronic kidney disease: Secondary | ICD-10-CM | POA: Diagnosis not present

## 2022-02-05 DIAGNOSIS — I5022 Chronic systolic (congestive) heart failure: Secondary | ICD-10-CM

## 2022-02-05 DIAGNOSIS — J90 Pleural effusion, not elsewhere classified: Secondary | ICD-10-CM | POA: Diagnosis not present

## 2022-02-05 DIAGNOSIS — Z8546 Personal history of malignant neoplasm of prostate: Secondary | ICD-10-CM

## 2022-02-05 DIAGNOSIS — Z923 Personal history of irradiation: Secondary | ICD-10-CM

## 2022-02-05 DIAGNOSIS — Z800 Family history of malignant neoplasm of digestive organs: Secondary | ICD-10-CM

## 2022-02-05 DIAGNOSIS — R600 Localized edema: Secondary | ICD-10-CM | POA: Diagnosis not present

## 2022-02-05 DIAGNOSIS — I482 Chronic atrial fibrillation, unspecified: Secondary | ICD-10-CM | POA: Diagnosis present

## 2022-02-05 LAB — CBC
HCT: 35.3 % — ABNORMAL LOW (ref 39.0–52.0)
Hemoglobin: 11.2 g/dL — ABNORMAL LOW (ref 13.0–17.0)
MCH: 23.6 pg — ABNORMAL LOW (ref 26.0–34.0)
MCHC: 31.7 g/dL (ref 30.0–36.0)
MCV: 74.3 fL — ABNORMAL LOW (ref 80.0–100.0)
Platelets: 154 10*3/uL (ref 150–400)
RBC: 4.75 MIL/uL (ref 4.22–5.81)
RDW: 19.1 % — ABNORMAL HIGH (ref 11.5–15.5)
WBC: 8.9 10*3/uL (ref 4.0–10.5)
nRBC: 0 % (ref 0.0–0.2)

## 2022-02-05 LAB — BASIC METABOLIC PANEL
Anion gap: 10 (ref 5–15)
BUN: 23 mg/dL (ref 8–23)
CO2: 26 mmol/L (ref 22–32)
Calcium: 9.4 mg/dL (ref 8.9–10.3)
Chloride: 100 mmol/L (ref 98–111)
Creatinine, Ser: 1.76 mg/dL — ABNORMAL HIGH (ref 0.61–1.24)
GFR, Estimated: 37 mL/min — ABNORMAL LOW (ref >60)
Glucose, Bld: 157 mg/dL — ABNORMAL HIGH (ref 70–99)
Potassium: 3.5 mmol/L (ref 3.5–5.1)
Sodium: 136 mmol/L (ref 135–145)

## 2022-02-05 LAB — MAGNESIUM: Magnesium: 1.8 mg/dL (ref 1.7–2.4)

## 2022-02-05 LAB — TROPONIN I (HIGH SENSITIVITY)
Troponin I (High Sensitivity): 50 ng/L — ABNORMAL HIGH (ref <18)
Troponin I (High Sensitivity): 52 ng/L — ABNORMAL HIGH (ref <18)

## 2022-02-05 LAB — BRAIN NATRIURETIC PEPTIDE: B Natriuretic Peptide: 1573 pg/mL — ABNORMAL HIGH (ref 0.0–100.0)

## 2022-02-05 MED ORDER — ALBUTEROL SULFATE HFA 108 (90 BASE) MCG/ACT IN AERS: 2.0000 | INHALATION_SPRAY | RESPIRATORY_TRACT | Status: DC | PRN

## 2022-02-05 MED ORDER — FUROSEMIDE 10 MG/ML IJ SOLN
60.0000 mg | Freq: Once | INTRAMUSCULAR | Status: AC
  Administered 2022-02-05: 60 mg via INTRAVENOUS
  Filled 2022-02-05: qty 6

## 2022-02-05 MED ORDER — MAGNESIUM OXIDE -MG SUPPLEMENT 400 (240 MG) MG PO TABS
800.0000 mg | ORAL_TABLET | Freq: Once | ORAL | Status: AC
  Administered 2022-02-05: 800 mg via ORAL
  Filled 2022-02-05: qty 2

## 2022-02-05 MED ORDER — POTASSIUM CHLORIDE CRYS ER 20 MEQ PO TBCR
40.0000 meq | EXTENDED_RELEASE_TABLET | Freq: Once | ORAL | Status: AC
  Administered 2022-02-05: 40 meq via ORAL
  Filled 2022-02-05: qty 2

## 2022-02-05 MED ORDER — POTASSIUM CHLORIDE 10 MEQ/100ML IV SOLN
10.0000 meq | Freq: Once | INTRAVENOUS | Status: AC
Start: 2022-02-05 — End: 2022-02-06
  Administered 2022-02-05: 10 meq via INTRAVENOUS
  Filled 2022-02-05: qty 100

## 2022-02-05 NOTE — ED Provider Notes (Signed)
?[place] ?Provider Note ? ? ?CSN: [id number] ?Arrival date & time: 02/05/22  1817 ? ?  ? ?History ? ?Chief Complaint  ?Patient presents with  ? Shortness of Breath  ? ? ?[name] is a 88 y.o. male. ? ?88 yo M with a cc of sob.  Going on for the past few weeks. Patient with leg swelling, orthopnea.  Seen multiple times by PCP and tried different diuretics without improvement.  With worsening sent here for admission.  ? ? ?Shortness of Breath ? ?  ? ?Home Medications ?Prior to Admission medications   ?Medication Sig Start Date End Date Taking? Authorizing Provider  ?albuterol (VENTOLIN HFA) 108 (90 Base) MCG/ACT inhaler Inhale 2 puffs into the lungs every 6 (six) hours as needed. ?Patient taking differently: Inhale 2 puffs into the lungs every 6 (six) hours as needed for wheezing or shortness of breath. 08/12/20   Spero Geralds, MD  ?amLODipine (NORVASC) 5 MG tablet TAKE 1 TABLET BY MOUTH EVERY DAY 08/17/21   Burchette, Alinda Sierras, MD  ?BD PEN NEEDLE NANO 2ND GEN 32G X 4 MM MISC USE AS DIRECTED. 11/30/21   Burchette, Alinda Sierras, MD  ?blood glucose meter kit and supplies KIT Dispense based on patient and insurance preference. Use up to four times daily as directed. (FOR ICD-9 250.00, 250.01). 09/08/20   Guilford Shi, MD  ?budesonide-formoterol (SYMBICORT) 160-4.5 MCG/ACT inhaler Inhale 2 puffs into the lungs in the morning and at bedtime. ?Patient taking differently: Inhale 2 puffs into the lungs 2 (two) times daily as needed (sob/wheezing). 08/07/20   Spero Geralds, MD  ?Carboxymethylcellul-Glycerin (LUBRICATING EYE DROPS OP) Place 1 drop into both eyes daily as needed (dry eyes).    [provider]  ?carvedilol (COREG) 6.25 MG tablet TAKE 1 TABLET (6.25 MG TOTAL) BY MOUTH 2 (TWO) TIMES DAILY WITH A MEAL. 06/04/21   [name], MD  ?clopidogrel (PLAVIX) 75 MG tablet Take 1 tablet (75 mg total) by mouth daily with breakfast. 08/21/21   Leanor Kail, PA   ?Cyanocobalamin (VITAMIN B-12 PO) Take 1 tablet by mouth daily.    [provider]  ?docusate sodium (STOOL SOFTENER) 100 MG capsule Take 1 capsule (100 mg total) by mouth 2 (two) times daily as needed for mild constipation. 07/28/19   Ivor Costa, MD  ?Arne [place] 2.5 MG TABS tablet TAKE 1 TABLET BY MOUTH TWICE A DAY 11/30/21   Tommie Raymond, NP  ?Homeopathic Products [facility] RELIEF EX) Apply 1 application topically daily as needed (knee pain).    [provider]  ?hydrocortisone 2.5 % cream Apply 1 application topically daily as needed (facial breakouts).    [provider]  ?Insulin Degludec (TRESIBA) 100 UNIT/ML SOLN Inject 10 Units into the skin daily. 09/30/21   Burchette, Alinda Sierras, MD  ?JANUVIA 50 MG tablet TAKE 1 TABLET BY MOUTH EVERY DAY 08/17/21   Burchette, Alinda Sierras, MD  ?Multiple Vitamins-Minerals (ICAPS AREDS 2 PO) Take 1 capsule by mouth daily.    [provider]  ?nitroGLYCERIN (NITROSTAT) 0.4 MG SL tablet PLACE 1 TABLET UNDER THE TONGUE EVERY 5 MINUTES X 3 DOSES AS NEEDED FOR CHEST PAIN 01/29/22   [name], MD  ?pantoprazole (PROTONIX) 40 MG tablet TAKE 1 TABLET BY MOUTH EVERY DAY 10/29/21   Burchette, Alinda Sierras, MD  ?potassium chloride (KLOR-CON 10) 10 MEQ tablet Take 1 tablet (10 mEq total) by mouth daily. 02/01/22   Burchette, Alinda Sierras, MD  ?rosuvastatin (CRESTOR)  20 MG tablet TAKE 1 TABLET BY MOUTH EVERY DAY 12/31/21   Burchette, Alinda Sierras, MD  ?sertraline (ZOLOFT) 50 MG tablet TAKE 1 TABLET BY MOUTH EVERY DAY 01/11/22   Burchette, Alinda Sierras, MD  ?spironolactone (ALDACTONE) 25 MG tablet TAKE 1 TABLET (25 MG TOTAL) BY MOUTH DAILY. 12/31/21   [name], MD  ?torsemide (DEMADEX) 20 MG tablet Take 20 mg by mouth daily. TAKE ONE TO TWO TABLETS BY MOUTH ONCE DAILY    [provider]  ?   ? ?Allergies    ?Codeine and Morphine   ? ?Review of Systems   ?Review of Systems  ?Respiratory:  Positive for shortness of breath.   ? ?Physical Exam ?Updated Vital  Signs ?BP 119/81 (BP Location: Right Arm)   Pulse 90   Temp 98.1 ?F (36.7 ?C) (Oral)   Resp 19   Ht '5\' 9"'  (1.753 m)   Wt 75.8 kg   SpO2 100%   BMI 24.66 kg/m?  ?Physical Exam ?Vitals and nursing note reviewed.  ?Constitutional:   ?   Appearance: He is well-developed.  ?HENT:  ?   Head: Normocephalic and atraumatic.  ?Eyes:  ?   Pupils: Pupils are equal, round, and reactive to light.  ?Neck:  ?   Vascular: No JVD.  ?Cardiovascular:  ?   Rate and Rhythm: Normal rate and regular rhythm.  ?   Heart sounds: No murmur heard. ?  No friction rub. No gallop.  ?Pulmonary:  ?   Effort: No respiratory distress.  ?   Breath sounds: Rales (bases) present. No wheezing.  ?Abdominal:  ?   General: There is no distension.  ?   Tenderness: There is no abdominal tenderness. There is no guarding or rebound.  ?Musculoskeletal:     ?   General: Normal range of motion.  ?   Cervical back: Normal range of motion and neck supple.  ?   Right lower leg: Edema present.  ?   Left lower leg: Edema present.  ?Skin: ?   Coloration: Skin is not pale.  ?   Findings: No rash.  ?Neurological:  ?   Mental Status: He is alert and oriented to person, place, and time.  ?Psychiatric:     ?   Behavior: Behavior normal.  ? ? ?ED Results / Procedures / Treatments   ?Labs ?(all labs ordered are listed, but only abnormal results are displayed) ?Labs Reviewed  ?BASIC METABOLIC PANEL - Abnormal; Notable for the following components:  ?    Result Value  ? Glucose, Bld 157 (*)   ? Creatinine, Ser 1.76 (*)   ? GFR, Estimated 37 (*)   ? All other components within normal limits  ?CBC - Abnormal; Notable for the following components:  ? Hemoglobin 11.2 (*)   ? HCT 35.3 (*)   ? MCV 74.3 (*)   ? MCH 23.6 (*)   ? RDW 19.1 (*)   ? All other components within normal limits  ?BRAIN NATRIURETIC PEPTIDE - Abnormal; Notable for the following components:  ? B Natriuretic Peptide 1,573.0 (*)   ? All other components within normal limits  ?TROPONIN I (HIGH SENSITIVITY) -  Abnormal; Notable for the following components:  ? Troponin I (High Sensitivity) 50 (*)   ? All other components within normal limits  ?TROPONIN I (HIGH SENSITIVITY) - Abnormal; Notable for the following components:  ? Troponin I (High Sensitivity) 52 (*)   ? All other components within normal limits  ?MAGNESIUM  ?CBC  WITH DIFFERENTIAL/PLATELET  ?COMPREHENSIVE METABOLIC PANEL  ?MAGNESIUM  ?PHOSPHORUS  ? ? ?EKG ?EKG Interpretation ? ?Date/Time:  Friday February 05 2022 18:49:43 EDT ?Ventricular Rate:  86 ?PR Interval:    ?QRS Duration: 154 ?QT Interval:  414 ?QTC Calculation: 495 ?R Axis:   128 ?Text Interpretation: Atrial fibrillation with a competing junctional pacemaker Right axis deviation Left ventricular hypertrophy with QRS widening and repolarization abnormality ( Cornell product ) Abnormal ECG with QRS widening Otherwise no significant change Confirmed by Deno Etienne [phone]) on 02/05/2022 10:21:37 PM ? ?Radiology ?DG Chest 2 View ? ?Result Date: 02/05/2022 ?CLINICAL DATA:  Shortness of breath and worsening bilateral leg edema x1 month. EXAM: CHEST - 2 VIEW COMPARISON:  February 01, 2022 FINDINGS: Multiple sternal wires and vascular clips are seen. The cardiac silhouette is mildly enlarged and unchanged in size. An artificial aortic valve is present. Mild, diffuse, chronic appearing increased lung markings are seen. Very mild atelectatic changes are noted within the bilateral lung bases and lateral aspect of the mid right lung. There is a small left pleural effusion. No pneumothorax is identified. Multilevel degenerative changes seen throughout the thoracic spine. IMPRESSION: 1. Chronic appearing increased lung markings with mild bibasilar and mid right lung atelectasis. 2. Small left pleural effusion. Electronically Signed   By: Virgina Norfolk M.D.   On: 02/05/2022 19:36   ? ?Procedures ?Procedures  ? ? ?Medications Ordered in ED ?Medications  ?albuterol (VENTOLIN HFA) 108 (90 Base) MCG/ACT inhaler 2 puff (has no  administration in time range)  ?furosemide (LASIX) injection 60 mg (has no administration in time range)  ?potassium chloride 10 mEq in 100 mL IVPB (has no administration in time range)  ?magnesium oxide (MAG-OX) table

## 2022-02-05 NOTE — Telephone Encounter (Signed)
Noted  

## 2022-02-05 NOTE — Telephone Encounter (Signed)
[name] called and stated that she recommended the pt to go to the hospital since he discussed that the water pills that were prescribed to him wasn't working and he wasn't getting any better however the pt declined and would rather get advice from Dr. Elease Hashimoto on what he should do. Pt is aware he has an upcoming appt on 4/26. Pt would like a call from Dr. [phone] ? ?Please advise.  ?

## 2022-02-05 NOTE — Telephone Encounter (Signed)
Pt states provider told them to call if swelling in legs did not improve. Pt has appointment scheduled for 02/10/22 at 1130. Sent patient to speak with triage nurse ?

## 2022-02-05 NOTE — ED Triage Notes (Signed)
Pt arrived POV from home c/o [facility] and worsening bilateral leg edema x1 month.  ?

## 2022-02-05 NOTE — Telephone Encounter (Signed)
Please advise 

## 2022-02-05 NOTE — H&P (Addendum)
?History and Physical ? ?[name] JJH:[id number] DOB: 09-Mar-1933 DOA: 02/05/2022 ? ?Referring physician: Dr. Tyrone Nine, [place]  ?PCP: [name], MD  ?Outpatient Specialists: Cardiology. ?Patient coming from: Home. ? ?Chief Complaint: Shortness of breath and fluid overload ? ?HPI: [name] is a 88 y.o. male with medical history significant for coronary artery disease, aortic stenosis, HFrEF 40-45%, permanent A-fib on Eliquis, hypertension, COPD, GERD, type 2 diabetes with polyneuropathy, CKD 3B, history of gastritis and esophagitis in 2012, history of prostate cancer with prostatectomy, post transcatheter aortic valve replacement, who presented to [facility] ED at the recommendation of his primary care provider due to worsening shortness of breath and volume overload.  For the past 2 to 3 weeks he has been having complaints of orthopnea, dyspnea with minimal exertion and intermittent sharp chest pain lasting a few seconds and resolving spontaneously.  Associated with nonproductive cough.  Afebrile.  He has had multiple visits with his primary care provider and tried on different diuretics without significant improvement.  Due to worsening symptomatology and in an effort to control his volume overload, he was advised by his primary care provider to come to the ED for further evaluation.  In the ED, patient appears volume overload with mild diffuse rales noted on lung auscultation, 3+ pitting edema in lower extremities all the way up to his knees.  The patient was started on IV diuretics by EDP.  TRH, hospitalist team, was asked to admit. ? ?ED Course: Tmax 98.1.  BP 128/81, pulse 81, respiration rate 22, with saturation 96% on room air.  Lab studies remarkable for creatinine 1.76 with GFR of 37, from baseline of 1.59 and GFR of 39, glucose 157, hemoglobin of 11.2, MCV 74, ? ?Review of Systems: ?Review of systems as noted in the HPI. All other systems reviewed and are negative. ? ? ?Past Medical History:  ?Diagnosis  Date  ? Aortic stenosis, moderate 09/20/2020  ? Arthritis   ? CAD (coronary artery disease)   ? a. Cath September 2015 LIMA to the LAD patent, SVG to PDA patent, SVG to posterior lateral patent, SVG to OM with a 90% in-stent restenosis at an anastomotic lesion. This was treated with angioplasty. b. cath 04/01/2015 95% ISR in SVG to OM treated with 2.75x24 Synergy DES postdilated to 3.29mm, all other grafts patent  ? Cataract   ? surgery,B/L  ? CHF (congestive heart failure) ([place])   ? Chronic kidney disease   ? nephrolithiasis  ? Diabetes mellitus   ? TYPE 2  ? GERD (gastroesophageal reflux disease)   ? Hernia   ? Hypercholesterolemia   ? Hypertension   ? Incontinence   ? hx  over 1 year,leaks without awareness  ? Other and unspecified diseases of appendix   ? PAF (paroxysmal atrial fibrillation) ([place])   ? in the setting of ischemia on 03/31/2015  ? Pancreatitis   ? Prostate CA ([place]) 03/01/04  ? prostate bx=Adenocarcinoam,gleason 3+4=7,PSA=6.75  ? Shortness of breath dyspnea   ? Ulcer   ? hx gastric  ? ?Past Surgical History:  ?Procedure Laterality Date  ? APPENDECTOMY    ? BACK SURGERY    ? CARDIAC CATHETERIZATION N/A 04/01/2015  ? Procedure: Left Heart Cath and Cors/Grafts Angiography;  Surgeon: Jettie Booze, MD;  Location: [place] [facility];  Service: Cardiovascular;  Laterality: N/A;  ? CARDIAC CATHETERIZATION N/A 04/01/2015  ? Procedure: Coronary Stent Intervention;  Surgeon: Jettie Booze, MD;  Location: [place] [facility];  Service: Cardiovascular;  Laterality: N/A;  ? CARDIOVERSION N/A 11/01/2019  ? Procedure: CARDIOVERSION;  Surgeon: Geralynn Rile, MD;  Location: [place];  Service: Cardiovascular;  Laterality: N/A;  ? [place]  2004  ? both hands  ? CORONARY ARTERY BYPASS GRAFT    ? CORONARY BALLOON ANGIOPLASTY N/A 08/19/2021  ? Procedure: CORONARY BALLOON ANGIOPLASTY;  Surgeon: Burnell Blanks, MD;  Location: [place] [facility];  Service: Cardiovascular;  Laterality:  N/A;  ? CORONARY STENT INTERVENTION N/A 09/23/2020  ? Procedure: CORONARY STENT INTERVENTION;  Surgeon: Sherren Mocha, MD;  Location: [place] [facility];  Service: Cardiovascular;  Laterality: N/A;  ? CYSTOSCOPY  08/23/12  ? incomplete emoptying bladder  ? HERNIA REPAIR    ? incision and drainage of right chest abscess    ? INTRAOPERATIVE TRANSTHORACIC ECHOCARDIOGRAM Left 04/28/2021  ? Procedure: INTRAOPERATIVE TRANSTHORACIC ECHOCARDIOGRAM;  Surgeon: Burnell Blanks, MD;  Location: [place];  Service: Open Heart Surgery;  Laterality: Left;  ? LAPAROSCOPIC CHOLECYSTECTOMY  09/2009  ? LEFT HEART CATH AND CORS/GRAFTS ANGIOGRAPHY N/A 08/19/2021  ? Procedure: LEFT HEART CATH AND CORS/GRAFTS ANGIOGRAPHY;  Surgeon: Burnell Blanks, MD;  Location: [place] [facility];  Service: Cardiovascular;  Laterality: N/A;  ? LEFT HEART CATHETERIZATION WITH CORONARY ANGIOGRAM N/A 07/19/2014  ? Procedure: LEFT HEART CATHETERIZATION WITH CORONARY ANGIOGRAM;  Surgeon: Leonie Man, MD;  Location: [facility] CATH LAB;  Service: Cardiovascular;  Laterality: N/A;  ? RECTAL SURGERY    ? RIGHT/LEFT HEART CATH AND CORONARY/GRAFT ANGIOGRAPHY N/A 09/23/2020  ? Procedure: RIGHT/LEFT HEART CATH AND CORONARY/GRAFT ANGIOGRAPHY;  Surgeon: Sherren Mocha, MD;  Location: [place] [facility];  Service: Cardiovascular;  Laterality: N/A;  ? ROBOT ASSISTED LAPAROSCOPIC RADICAL PROSTATECTOMY  2005  ? TRANSCATHETER AORTIC VALVE REPLACEMENT, TRANSFEMORAL Bilateral 04/28/2021  ? Procedure: TRANSCATHETER AORTIC VALVE REPLACEMENT, TRANSFEMORAL;  Surgeon: Burnell Blanks, MD;  Location: [place];  Service: Open Heart Surgery;  Laterality: Bilateral;  ? ULTRASOUND GUIDANCE FOR VASCULAR ACCESS Bilateral 04/28/2021  ? Procedure: ULTRASOUND GUIDANCE FOR VASCULAR ACCESS;  Surgeon: Burnell Blanks, MD;  Location: [place];  Service: Open Heart Surgery;  Laterality: Bilateral;  ? ? ?Social History:  reports that he quit smoking about 49 years ago. His smoking  use included cigarettes. He has a 2.50 pack-year smoking history. He has never used smokeless tobacco. He reports that he does not drink alcohol and does not use drugs. ? ? ?Allergies  ?Allergen Reactions  ? Codeine Other (See Comments)  ?  Makes him feel goofy  ? Morphine Other (See Comments)  ?  Nightmares and felt bad  ? ? ?Family History  ?Problem Relation Age of Onset  ? Cancer Mother   ?     stomach  ? Heart attack Father   ?  ? ? ?Prior to Admission medications   ?Medication Sig Start Date End Date Taking? Authorizing Provider  ?albuterol (VENTOLIN HFA) 108 (90 Base) MCG/ACT inhaler Inhale 2 puffs into the lungs every 6 (six) hours as needed. ?Patient taking differently: Inhale 2 puffs into the lungs every 6 (six) hours as needed for wheezing or shortness of breath. 08/12/20   Spero Geralds, MD  ?amLODipine (NORVASC) 5 MG tablet TAKE 1 TABLET BY MOUTH EVERY DAY 08/17/21   Burchette, Alinda Sierras, MD  ?BD PEN NEEDLE NANO 2ND GEN 32G X 4 MM MISC USE AS DIRECTED. 11/30/21   Burchette, Alinda Sierras, MD  ?blood glucose meter kit and supplies KIT Dispense based on patient and insurance preference.  Use up to four times daily as directed. (FOR ICD-9 250.00, 250.01). 09/08/20   Guilford Shi, MD  ?budesonide-formoterol (SYMBICORT) 160-4.5 MCG/ACT inhaler Inhale 2 puffs into the lungs in the morning and at bedtime. ?Patient taking differently: Inhale 2 puffs into the lungs 2 (two) times daily as needed (sob/wheezing). 08/07/20   Spero Geralds, MD  ?Carboxymethylcellul-Glycerin (LUBRICATING EYE DROPS OP) Place 1 drop into both eyes daily as needed (dry eyes).    [provider]  ?carvedilol (COREG) 6.25 MG tablet TAKE 1 TABLET (6.25 MG TOTAL) BY MOUTH 2 (TWO) TIMES DAILY WITH A MEAL. 06/04/21   [name], MD  ?clopidogrel (PLAVIX) 75 MG tablet Take 1 tablet (75 mg total) by mouth daily with breakfast. 08/21/21   Leanor Kail, PA  ?Cyanocobalamin (VITAMIN B-12 PO) Take 1 tablet by mouth daily.     [provider]  ?docusate sodium (STOOL SOFTENER) 100 MG capsule Take 1 capsule (100 mg total) by mouth 2 (two) times daily as needed for mild constipation. 07/28/19   Ivor Costa, MD  ?Arne [place] 2.5 MG

## 2022-02-06 ENCOUNTER — Inpatient Hospital Stay (HOSPITAL_COMMUNITY): Payer: Medicare PPO

## 2022-02-06 DIAGNOSIS — I10 Essential (primary) hypertension: Secondary | ICD-10-CM | POA: Diagnosis not present

## 2022-02-06 DIAGNOSIS — N1832 Chronic kidney disease, stage 3b: Secondary | ICD-10-CM | POA: Diagnosis present

## 2022-02-06 DIAGNOSIS — E785 Hyperlipidemia, unspecified: Secondary | ICD-10-CM

## 2022-02-06 DIAGNOSIS — D649 Anemia, unspecified: Secondary | ICD-10-CM | POA: Diagnosis present

## 2022-02-06 DIAGNOSIS — I5021 Acute systolic (congestive) heart failure: Secondary | ICD-10-CM | POA: Diagnosis not present

## 2022-02-06 DIAGNOSIS — I482 Chronic atrial fibrillation, unspecified: Secondary | ICD-10-CM | POA: Diagnosis present

## 2022-02-06 DIAGNOSIS — E1169 Type 2 diabetes mellitus with other specified complication: Secondary | ICD-10-CM | POA: Diagnosis present

## 2022-02-06 DIAGNOSIS — R7989 Other specified abnormal findings of blood chemistry: Secondary | ICD-10-CM

## 2022-02-06 DIAGNOSIS — I5023 Acute on chronic systolic (congestive) heart failure: Secondary | ICD-10-CM

## 2022-02-06 LAB — COMPREHENSIVE METABOLIC PANEL
ALT: 17 U/L (ref 0–44)
AST: 15 U/L (ref 15–41)
Albumin: 3.6 g/dL (ref 3.5–5.0)
Alkaline Phosphatase: 146 U/L — ABNORMAL HIGH (ref 38–126)
Anion gap: 9 (ref 5–15)
BUN: 21 mg/dL (ref 8–23)
CO2: 28 mmol/L (ref 22–32)
Calcium: 9.9 mg/dL (ref 8.9–10.3)
Chloride: 102 mmol/L (ref 98–111)
Creatinine, Ser: 1.77 mg/dL — ABNORMAL HIGH (ref 0.61–1.24)
GFR, Estimated: 37 mL/min — ABNORMAL LOW (ref >60)
Glucose, Bld: 121 mg/dL — ABNORMAL HIGH (ref 70–99)
Potassium: 4.1 mmol/L (ref 3.5–5.1)
Sodium: 139 mmol/L (ref 135–145)
Total Bilirubin: 1.7 mg/dL — ABNORMAL HIGH (ref 0.3–1.2)
Total Protein: 7.4 g/dL (ref 6.5–8.1)

## 2022-02-06 LAB — CBC WITH DIFFERENTIAL/PLATELET
Abs Immature Granulocytes: 0.04 10*3/uL (ref 0.00–0.07)
Basophils Absolute: 0 10*3/uL (ref 0.0–0.1)
Basophils Relative: 0 %
Eosinophils Absolute: 0.1 10*3/uL (ref 0.0–0.5)
Eosinophils Relative: 1 %
HCT: 37.4 % — ABNORMAL LOW (ref 39.0–52.0)
Hemoglobin: 11.6 g/dL — ABNORMAL LOW (ref 13.0–17.0)
Immature Granulocytes: 0 %
Lymphocytes Relative: 9 %
Lymphs Abs: 1 10*3/uL (ref 0.7–4.0)
MCH: 23 pg — ABNORMAL LOW (ref 26.0–34.0)
MCHC: 31 g/dL (ref 30.0–36.0)
MCV: 74.1 fL — ABNORMAL LOW (ref 80.0–100.0)
Monocytes Absolute: 0.8 10*3/uL (ref 0.1–1.0)
Monocytes Relative: 8 %
Neutro Abs: 8.4 10*3/uL — ABNORMAL HIGH (ref 1.7–7.7)
Neutrophils Relative %: 82 %
Platelets: 142 10*3/uL — ABNORMAL LOW (ref 150–400)
RBC: 5.05 MIL/uL (ref 4.22–5.81)
RDW: 19.3 % — ABNORMAL HIGH (ref 11.5–15.5)
WBC: 10.3 10*3/uL (ref 4.0–10.5)
nRBC: 0 % (ref 0.0–0.2)

## 2022-02-06 LAB — ECHOCARDIOGRAM COMPLETE
AR max vel: 2.53 cm2
AV Area VTI: 2.77 cm2
AV Area mean vel: 2.6 cm2
AV Mean grad: 2.3 mmHg
AV Peak grad: 5.1 mmHg
Ao pk vel: 1.13 m/s
Area-P 1/2: 5.75 cm2
Calc EF: 35 %
Height: 69 in
P 1/2 time: 696 msec
S' Lateral: 4.1 cm
Single Plane A2C EF: 37.2 %
Single Plane A4C EF: 32.7 %
Weight: 2672 oz

## 2022-02-06 LAB — PHOSPHORUS: Phosphorus: 3 mg/dL (ref 2.5–4.6)

## 2022-02-06 LAB — IRON AND TIBC
Iron: 25 ug/dL — ABNORMAL LOW (ref 45–182)
Saturation Ratios: 6 % — ABNORMAL LOW (ref 17.9–39.5)
TIBC: 407 ug/dL (ref 250–450)
UIBC: 382 ug/dL

## 2022-02-06 LAB — CBG MONITORING, ED
Glucose-Capillary: 120 mg/dL — ABNORMAL HIGH (ref 70–99)
Glucose-Capillary: 109 mg/dL — ABNORMAL HIGH (ref 70–99)
Glucose-Capillary: 141 mg/dL — ABNORMAL HIGH (ref 70–99)

## 2022-02-06 LAB — GLUCOSE, CAPILLARY
Glucose-Capillary: 165 mg/dL — ABNORMAL HIGH (ref 70–99)
Glucose-Capillary: 127 mg/dL — ABNORMAL HIGH (ref 70–99)

## 2022-02-06 LAB — FERRITIN: Ferritin: 49 ng/mL (ref 24–336)

## 2022-02-06 LAB — MAGNESIUM: Magnesium: 1.9 mg/dL (ref 1.7–2.4)

## 2022-02-06 MED ORDER — INSULIN ASPART 100 UNIT/ML IJ SOLN
0.0000 [IU] | Freq: Three times a day (TID) | INTRAMUSCULAR | Status: DC
  Administered 2022-02-06: 1 [IU] via SUBCUTANEOUS
  Administered 2022-02-06 – 2022-02-07 (×2): 2 [IU] via SUBCUTANEOUS
  Administered 2022-02-07 (×2): 1 [IU] via SUBCUTANEOUS
  Administered 2022-02-08: 2 [IU] via SUBCUTANEOUS
  Administered 2022-02-08: 1 [IU] via SUBCUTANEOUS

## 2022-02-06 MED ORDER — CARVEDILOL 3.125 MG PO TABS
3.1250 mg | ORAL_TABLET | Freq: Two times a day (BID) | ORAL | Status: DC
  Administered 2022-02-06 – 2022-02-08 (×6): 3.125 mg via ORAL
  Filled 2022-02-06 (×6): qty 1

## 2022-02-06 MED ORDER — ASPIRIN EC 81 MG PO TBEC
81.0000 mg | DELAYED_RELEASE_TABLET | Freq: Every day | ORAL | Status: DC
Start: 2022-02-06 — End: 2022-02-08
  Administered 2022-02-06 – 2022-02-08 (×3): 81 mg via ORAL
  Filled 2022-02-06 (×3): qty 1

## 2022-02-06 MED ORDER — PANTOPRAZOLE SODIUM 40 MG PO TBEC
40.0000 mg | DELAYED_RELEASE_TABLET | Freq: Every day | ORAL | Status: DC
  Administered 2022-02-06 – 2022-02-08 (×3): 40 mg via ORAL
  Filled 2022-02-06 (×3): qty 1

## 2022-02-06 MED ORDER — PERFLUTREN LIPID MICROSPHERE
1.0000 mL | INTRAVENOUS | Status: AC | PRN
  Administered 2022-02-06: 2.5 mL via INTRAVENOUS
  Filled 2022-02-06: qty 10

## 2022-02-06 MED ORDER — ROSUVASTATIN CALCIUM 20 MG PO TABS
20.0000 mg | ORAL_TABLET | Freq: Every day | ORAL | Status: DC
  Administered 2022-02-06 – 2022-02-08 (×3): 20 mg via ORAL
  Filled 2022-02-06 (×3): qty 1

## 2022-02-06 MED ORDER — FUROSEMIDE 10 MG/ML IJ SOLN
40.0000 mg | Freq: Two times a day (BID) | INTRAMUSCULAR | Status: DC
  Administered 2022-02-06 (×2): 40 mg via INTRAVENOUS
  Filled 2022-02-06 (×2): qty 4

## 2022-02-06 MED ORDER — INSULIN ASPART 100 UNIT/ML IJ SOLN: 0.0000 [IU] | Freq: Every day | INTRAMUSCULAR | Status: DC

## 2022-02-06 MED ORDER — SPIRONOLACTONE 12.5 MG HALF TABLET
12.5000 mg | ORAL_TABLET | Freq: Every day | ORAL | Status: DC
  Administered 2022-02-06 – 2022-02-08 (×3): 12.5 mg via ORAL
  Filled 2022-02-06 (×3): qty 1

## 2022-02-06 MED ORDER — FUROSEMIDE 10 MG/ML IJ SOLN
60.0000 mg | Freq: Two times a day (BID) | INTRAMUSCULAR | Status: DC
  Administered 2022-02-07 – 2022-02-08 (×3): 60 mg via INTRAVENOUS
  Filled 2022-02-06 (×3): qty 6

## 2022-02-06 MED ORDER — SERTRALINE HCL 50 MG PO TABS
50.0000 mg | ORAL_TABLET | Freq: Every day | ORAL | Status: DC
  Administered 2022-02-06 – 2022-02-08 (×3): 50 mg via ORAL
  Filled 2022-02-06 (×3): qty 1

## 2022-02-06 MED ORDER — POTASSIUM CHLORIDE CRYS ER 10 MEQ PO TBCR
10.0000 meq | EXTENDED_RELEASE_TABLET | Freq: Every day | ORAL | Status: DC
  Administered 2022-02-06: 10 meq via ORAL
  Filled 2022-02-06: qty 1

## 2022-02-06 MED ORDER — APIXABAN 2.5 MG PO TABS
2.5000 mg | ORAL_TABLET | Freq: Two times a day (BID) | ORAL | Status: DC
  Administered 2022-02-06 – 2022-02-08 (×6): 2.5 mg via ORAL
  Filled 2022-02-06 (×6): qty 1

## 2022-02-06 MED ORDER — MOMETASONE FURO-FORMOTEROL FUM 200-5 MCG/ACT IN AERO
2.0000 | INHALATION_SPRAY | Freq: Two times a day (BID) | RESPIRATORY_TRACT | Status: DC
  Administered 2022-02-06 – 2022-02-08 (×4): 2 via RESPIRATORY_TRACT
  Filled 2022-02-06: qty 8.8

## 2022-02-06 MED ORDER — FUROSEMIDE 10 MG/ML IJ SOLN
20.0000 mg | Freq: Two times a day (BID) | INTRAMUSCULAR | Status: DC
  Administered 2022-02-06: 20 mg via INTRAVENOUS
  Filled 2022-02-06: qty 2

## 2022-02-06 NOTE — ED Notes (Signed)
ED TO INPATIENT HANDOFF REPORT ? ?ED Nurse Name and Phone #: Baxter Flattery, RN ? ?S ?Name/Age/Gender ?[name] ?88 y.o. ?male ?Room/Bed: 038C/038C ? ?Code Status ?  Code Status: Full Code ? ?Home/SNF/Other ?Home ?Patient oriented to: self, place, time, and situation ?Is this baseline? Yes  ? ?Triage Complete: Triage complete  ?Chief Complaint ?Acute on chronic systolic CHF (congestive heart failure) ([place]) [I50.23] ? ?Triage Note ?Pt arrived POV from home c/o [facility] and worsening bilateral leg edema x1 month.   ? ?Allergies ?Allergies  ?Allergen Reactions  ? Codeine Other (See Comments)  ?  Makes him feel goofy  ? Morphine Other (See Comments)  ?  Nightmares and felt bad  ? ? ?Level of Care/Admitting Diagnosis ?ED Disposition   ? ? ED Disposition  ?Admit  ? Condition  ?--  ? Comment  ?Hospital Area: [facility] [[id number]] ? Level of Care: Telemetry Cardiac [103] ? May admit patient to [name] or [facility] if equivalent level of care is available:: No ? Covid Evaluation: Asymptomatic - no recent exposure (last 10 days) testing not required ? Diagnosis: Acute on chronic systolic CHF (congestive heart failure) ([place]) [846962] ? Admitting Physician: Kayleen Memos [[id number]] ? Attending Physician: Kayleen Memos [[id number]] ? Estimated length of stay: past midnight tomorrow ? Certification:: I certify this patient will need inpatient services for at least 2 midnights ?  ?  ? ?  ? ? ?B ?Medical/Surgery History ?Past Medical History:  ?Diagnosis Date  ? Aortic stenosis, moderate 09/20/2020  ? Arthritis   ? CAD (coronary artery disease)   ? a. Cath September 2015 LIMA to the LAD patent, SVG to PDA patent, SVG to posterior lateral patent, SVG to OM with a 90% in-stent restenosis at an anastomotic lesion. This was treated with angioplasty. b. cath 04/01/2015 95% ISR in SVG to OM treated with 2.75x24 Synergy DES postdilated to 3.11m, all other grafts patent  ? Cataract   ? surgery,B/L  ? CHF (cong[place]ive heart failure)  [place]   ? Chronic kidney disease   ? nephrolithiasis  ? Diabetes mellitus   ? TYPE 2  ? GERD (gastroesophageal reflux disease)   ? Hernia   ? Hypercholesterolemia   ? Hypertension   ? Incontinence   ? hx  over 1 year,leaks without awareness  ? Other and unspecified diseases of appendix   ? [place]   ? in the setting of ischemia on 03/31/2015  ? Pancreatitis   ? [place] 03/01/04  ? prostate bx=Adenocarcinoam,gleason 3+4=7,PSA=6.75  ? Shortness of breath dyspnea   ? Ulcer   ? hx gastric  ? ?Past Surgical History:  ?Procedure Laterality Date  ? APPENDECTOMY    ? BACK SURGERY    ? CARDIAC CATHETERIZATION N/A 04/01/2015  ? Procedure: Left Heart Cath and Cors/Grafts Angiography;  Surgeon: JJettie Booze MD;  [facility] [place];  Service: Cardiovascular;  Laterality: N/A;  ? CARDIAC CATHETERIZATION N/A 04/01/2015  ? Procedure: Coronary Stent Intervention;  Surgeon: JJettie Booze MD;  [place] [place];  Service: Cardiovascular;  Laterality: N/A;  ? CARDIOVERSION N/A 11/01/2019  ? Procedure: CARDIOVERSION;  Surgeon: OGeralynn Rile MD;  [place]  Service: Cardiovascular;  Laterality: N/A;  ? [place] 2004  ? both hands  ? CORONARY ARTERY BYPASS GRAFT    ? CORONARY BALLOON ANGIOPLASTY N/A 08/19/2021  ? Procedure: CORONARY BALLOON ANGIOPLASTY;  Surgeon: MBurnell Blanks  MD;  Location: [place] [facility];  Service: Cardiovascular;  Laterality: N/A;  ? CORONARY STENT INTERVENTION N/A 09/23/2020  ? Procedure: CORONARY STENT INTERVENTION;  Surgeon: Sherren Mocha, MD;  Location: [place] [facility];  Service: Cardiovascular;  Laterality: N/A;  ? CYSTOSCOPY  08/23/12  ? incomplete emoptying bladder  ? HERNIA REPAIR    ? incision and drainage of right chest abscess    ? INTRAOPERATIVE TRANSTHORACIC ECHOCARDIOGRAM Left 04/28/2021  ? Procedure: INTRAOPERATIVE TRANSTHORACIC ECHOCARDIOGRAM;  Surgeon: Burnell Blanks, MD;  Location: [place];  Service: Open Heart Surgery;  Laterality: Left;  ? LAPAROSCOPIC CHOLECYSTECTOMY  09/2009  ? LEFT HEART CATH AND CORS/GRAFTS ANGIOGRAPHY N/A 08/19/2021  ? Procedure: LEFT HEART CATH AND CORS/GRAFTS ANGIOGRAPHY;  Surgeon: Burnell Blanks, MD;  Location: [place] [facility];  Service: Cardiovascular;  Laterality: N/A;  ? LEFT HEART CATHETERIZATION WITH CORONARY ANGIOGRAM N/A 07/19/2014  ? Procedure: LEFT HEART CATHETERIZATION WITH CORONARY ANGIOGRAM;  Surgeon: Leonie Man, MD;  Location: [facility] CATH LAB;  Service: Cardiovascular;  Laterality: N/A;  ? RECTAL SURGERY    ? RIGHT/LEFT HEART CATH AND CORONARY/GRAFT ANGIOGRAPHY N/A 09/23/2020  ? Procedure: RIGHT/LEFT HEART CATH AND CORONARY/GRAFT ANGIOGRAPHY;  Surgeon: Sherren Mocha, MD;  Location: [place] [facility];  Service: Cardiovascular;  Laterality: N/A;  ? ROBOT ASSISTED LAPAROSCOPIC RADICAL PROSTATECTOMY  2005  ? TRANSCATHETER AORTIC VALVE REPLACEMENT, TRANSFEMORAL Bilateral 04/28/2021  ? Procedure: TRANSCATHETER AORTIC VALVE REPLACEMENT, TRANSFEMORAL;  Surgeon: Burnell Blanks, MD;  Location: [place];  Service: Open Heart Surgery;  Laterality: Bilateral;  ? ULTRASOUND GUIDANCE FOR VASCULAR ACCESS Bilateral 04/28/2021  ? Procedure: ULTRASOUND GUIDANCE FOR VASCULAR ACCESS;  Surgeon: Burnell Blanks, MD;  Location: [place];  Service: Open Heart Surgery;  Laterality: Bilateral;  ?  ? ?A ?IV Location/Drains/Wounds ?Patient Lines/Drains/Airways Status   ? ? Active Line/Drains/Airways   ? ? Name Placement date Placement time Site Days  ? Peripheral IV 22 G Posterior;Right Wrist --  --  Wrist  --  ? Incision (Closed) 04/28/21 Groin Left 04/28/21  1221  -- 284  ? Incision (Closed) 04/28/21 Groin Right 04/28/21  1221  -- 284  ? ?  ?  ? ?  ? ? ?Intake/Output Last 24 hours ? ?Intake/Output Summary (Last 24 hours) at 02/06/2022 1352 ?Last data filed at 02/06/2022 1138 ?Gross per 24 hour  ?Intake 98 ml  ?Output 1650 ml  ?Net -1552 ml   ? ? ?Labs/Imaging ?Results for orders placed or performed during the hospital encounter of 02/05/22 (from the past 48 hour(s))  ?Basic metabolic panel     Status: Abnormal  ? Collection Time: 02/05/22  7:33 PM  ?Result Value Ref Range  ? Sodium 136 135 - 145 mmol/L  ? Potassium 3.5 3.5 - 5.1 mmol/L  ? Chloride 100 98 - 111 mmol/L  ? CO2 26 22 - 32 mmol/L  ? Glucose, Bld 157 (H) 70 - 99 mg/dL  ?  Comment: Glucose reference range applies only to samples taken after fasting for at least 8 hours.  ? BUN 23 8 - 23 mg/dL  ? Creatinine, Ser 1.76 (H) 0.61 - 1.24 mg/dL  ? Calcium 9.4 8.9 - 10.3 mg/dL  ? GFR, Estimated 37 (L) >60 mL/min  ?  Comment: (NOTE) ?Calculated using the CKD-EPI Creatinine Equation (2021) ?  ? Anion gap 10 5 - 15  ?  Comment: Performed at [facility], [place] [address], [place], [place] [address]  ?CBC  Status: Abnormal  ? Collection Time: 02/05/22  7:33 PM  ?Result Value Ref Range  ? WBC 8.9 4.0 - 10.5 K/uL  ? RBC 4.75 4.22 - 5.81 MIL/uL  ? Hemoglobin 11.2 (L) 13.0 - 17.0 g/dL  ? HCT 35.3 (L) 39.0 - 52.0 %  ? MCV 74.3 (L) 80.0 - 100.0 fL  ? MCH 23.6 (L) 26.0 - 34.0 pg  ? MCHC 31.7 30.0 - 36.0 g/dL  ? RDW 19.1 (H) 11.5 - 15.5 %  ? Platelets 154 150 - 400 K/uL  ? nRBC 0.0 0.0 - 0.2 %  ?  Comment: Performed at [facility], [place] [address] [place], [place] [address]  ?Troponin I (High Sensitivity)     Status: Abnormal  ? Collection Time: 02/05/22  7:33 PM  ?Result Value Ref Range  ? Troponin I (High Sensitivity) 50 (H) <18 ng/L  ?  Comment: (NOTE) ?Elevated high sensitivity troponin I (hsTnI) values and significant  ?changes across serial measurements may suggest ACS but many other  ?chronic and acute conditions are known to elevate hsTnI results.  ?Refer to the "Links" section for chest pain algorithms and additional  ?guidance. ?Performed at [facility], [place] [address] [place], [place] ?[address] ?  ?Brain natriuretic peptide     Status: Abnormal  ? Collection Time: 02/05/22  7:33  PM  ?Result Value Ref Range  ? B Natriuretic Peptide 1,573.0 (H) 0.0 - 100.0 pg/mL  ?  Comment: Performed at [facility], [place] [address], [place], [place] [address]  ?Troponin I (High Sensitivity)

## 2022-02-06 NOTE — Assessment & Plan Note (Addendum)
Patient remained rate controlled with carvedilol for rate control and anticoagulation with apixaban.  ?

## 2022-02-06 NOTE — Consult Note (Signed)
?Cardiology Consultation:  ? ?Patient ID: [name] ?MRN: [id number]; DOB: 07-03-1933 ? ?Admit date: 02/05/2022 ?Date of Consult: 02/06/2022 ? ?PCP:  [name], MD ?  ?[facility] Providers ?Cardiologist:  [name], MD  ?Cardiology APP:  Ledora Bottcher, [place]     ? ? ?Patient Profile:  ? ?[name] is a 88 y.o. male with a hx of CAD s/p CABG and subsequent PCI, PAF, COPD, and AS s/p TAVR (100m evolut) 2021 with right and left heart catheterization that showed patent LIMA-LAD, SVG-left PLA, and SVG-PDA. He had severe in-stent restenosis of the SVG-OM treated with POBA, Dm02, Htn, HHLD, CKD 3b, h/o prostate cancer s/p rx, gastritis who is being seen 02/06/2022 for the evaluation of CHF exacerbation, chest pain at the request of Dr HNevada Crane ? ?History of Present Illness:  ? ?Shawn RENTERIAis a 88y.o. male with a hx of CAD s/p CABG and subsequent PCI, PAF, COPD, and AS s/p TAVR (258mevolut) 2021 with right and left heart catheterization that showed patent LIMA-LAD, SVG-left PLA, and SVG-PDA. He had severe in-stent restenosis of the SVG-OM treated with POBA, Dm02, Htn, HHLD, CKD 3b, h/o prostate cancer s/p rx, gastritis who is being seen 02/06/2022 for the evaluation of CHF exacerbation, chest pain. ? ?He reports worsening Sob, orthopnea, PND as well as exertional dyspnea (NYHA class IV) symptoms since last few days. Symptoms started from a month ago, went to PCP and got increased torsemide but still not helping. Has on and off sharp chest pain as well- sometimes brief episodes, sometimes more intense needing nitro, took 2 nitro in the last month. Denies any dizziness, syncope, falls,  ? ?ER eval: Cr 1.76, trop 50/52, BNP 1,573 (prior pro BNP 1,846) ?EKG: afib with LBBB (present in the past) ?Cxr shows pleural effusion and vascular congestion ? ?Cardiac work up: ?Left heart cath 08/19/21: ?  LM lesion is 25% stenosed. ?  Ost LM to Mid LM lesion is 50% stenosed. ?  Mid LAD lesion is 80% stenosed. ?  Mid  Cx to Dist Cx lesion is 80% stenosed. ?  Ost RCA to Prox RCA lesion is 100% stenosed. ?  Origin lesion is 40% stenosed. ?  RPDA lesion is 30% stenosed. ?  SVG to OM1 is patent. Dist Graft to Insertion lesion is 95% stenosed. ?  Balloon angioplasty was performed using a BALLN Hissop EMERGE MR 3.5X12. ?  Post intervention, there is a 30% residual stenosis. ?  LIMA to LAD is patent ?  SVG to PDA is patent ?  SVG to OM2 is patent ?  ?Severe triple vessel CAD s/p 4V CABG with 4/4 patent grafts ?Unable to selectively engage the left main artery due to obstruction of the ostium by the TAVR valve stent cage. Based on non-selective angiography the LAD and Circumflex are patent with anatomy likely unchanged from cath in 2022 but cannot definitively say this. All three grafts to the left system are patent.  ?Chronic occlusion proximal RCA ?Patent LIMA to LAD, SVG to OM1, SVG to OM2 and SVG to PDA ?Severe restenosis in the distal body of SVG to OM1 in the previously stented segment ?Successful PTCA with balloon angioplasty only of the restenosis within the distal body of the vein graft stented segment ? ?ECHO: 05/2021 ?IMPRESSIONS  ? ? ? 1. 29 mm Corevalve TAVR (04/28/2021). Vmax 1.2 m/s, MG 3 mmHG, EOA 3.33  ?cm2, DI 0.74. Trivial paravalvular leak noted. Normal prosthesis . The  ?aortic  valve has been repaired/replaced. Aortic valve regurgitation is  ?trivial. There is a 29 mm Medtronic  ?Evolut Pro( TAVR) valve present in the aortic position. Procedure Date:  ?04/28/2021.  ? 2. Left ventricular ejection fraction, by estimation, is 40 to 45%. The  ?left ventricle has mildly decreased function. The left ventricle  ?demonstrates global hypokinesis. There is moderate asymmetric left  ?ventricular hypertrophy of the basal-septal  ?segment. Left ventricular diastolic function could not be evaluated.  ? 3. Right ventricular systolic function is mildly reduced. The right  ?ventricular size is mildly enlarged. There is mildly elevated  pulmonary  ?artery systolic pressure. The estimated right ventricular systolic  ?pressure is 94.8 mmHg.  ? 4. Left atrial size was mildly dilated.  ? 5. Right atrial size was mildly dilated.  ? 6. The mitral valve is grossly normal. Mild mitral valve regurgitation.  ?No evidence of mitral stenosis.  ? 7. The inferior vena cava is normal in size with <50% respiratory  ?variability, suggesting right atrial pressure of 8 mmHg. ?  ? ?Past Medical History:  ?Diagnosis Date  ? Aortic stenosis, moderate 09/20/2020  ? Arthritis   ? CAD (coronary artery disease)   ? a. Cath September 2015 LIMA to the LAD patent, SVG to PDA patent, SVG to posterior lateral patent, SVG to OM with a 90% in-stent restenosis at an anastomotic lesion. This was treated with angioplasty. b. cath 04/01/2015 95% ISR in SVG to OM treated with 2.75x24 Synergy DES postdilated to 3.36m, all other grafts patent  ? Cataract   ? surgery,B/L  ? CHF (congestive heart failure) ([place]   ? Chronic kidney disease   ? nephrolithiasis  ? Diabetes mellitus   ? TYPE 2  ? GERD (gastroesophageal reflux disease)   ? Hernia   ? Hypercholesterolemia   ? Hypertension   ? Incontinence   ? hx  over 1 year,leaks without awareness  ? Other and unspecified diseases of appendix   ? PAF (paroxysmal atrial fibrillation) ([place]   ? in the setting of ischemia on 03/31/2015  ? Pancreatitis   ? Prostate CA ([place] 03/01/04  ? prostate bx=Adenocarcinoam,gleason 3+4=7,PSA=6.75  ? Shortness of breath dyspnea   ? Ulcer   ? hx gastric  ? ? ?Past Surgical History:  ?Procedure Laterality Date  ? APPENDECTOMY    ? BACK SURGERY    ? CARDIAC CATHETERIZATION N/A 04/01/2015  ? Procedure: Left Heart Cath and Cors/Grafts Angiography;  Surgeon: JJettie Booze MD;  [facility];  Service: Cardiovascular;  Laterality: N/A;  ? CARDIAC CATHETERIZATION N/A 04/01/2015  ? Procedure: Coronary Stent Intervention;  Surgeon: JJettie Booze MD;  [facility];  Service:  Cardiovascular;  Laterality: N/A;  ? CARDIOVERSION N/A 11/01/2019  ? Procedure: CARDIOVERSION;  Surgeon: OGeralynn Rile MD;  [facility]  Service: Cardiovascular;  Laterality: N/A;  ? [place] 2004  ? both hands  ? CORONARY ARTERY BYPASS GRAFT    ? CORONARY BALLOON ANGIOPLASTY N/A 08/19/2021  ? Procedure: CORONARY BALLOON ANGIOPLASTY;  Surgeon: MBurnell Blanks MD;  [facility];  Service: Cardiovascular;  Laterality: N/A;  ? CORONARY STENT INTERVENTION N/A 09/23/2020  ? Procedure: CORONARY STENT INTERVENTION;  Surgeon: CSherren Mocha MD;  [facility];  Service: Cardiovascular;  Laterality: N/A;  ? CYSTOSCOPY  08/23/12  ? incomplete emoptying bladder  ? HERNIA REPAIR    ? incision and drainage of right chest abscess    ?  INTRAOPERATIVE TRANSTHORACIC ECHOCARDIOGRAM Left 04/28/2021  ? Procedure: INTRAOPERATIVE TRANSTHORACIC ECHOCARDIOGRAM;  Surgeon: Burnell Blanks, MD;  Location: [facility];  Service: Open Heart Surgery;  Laterality: Left;  ? LAPAROSCOPIC CHOLECYSTECTOMY  09/2009  ? LEFT HEART CATH AND CORS/GRAFTS ANGIOGRAPHY N/A 08/19/2021  ? Procedure: LEFT HEART CATH AND CORS/GRAFTS ANGIOGRAPHY;  Surgeon: Burnell Blanks, MD;  [facility];  Service: Cardiovascular;  Laterality: N/A;  ? LEFT HEART CATHETERIZATION WITH CORONARY ANGIOGRAM N/A 07/19/2014  ? Procedure: LEFT HEART CATHETERIZATION WITH CORONARY ANGIOGRAM;  Surgeon: Leonie Man, MD;  [facility];  Service: Cardiovascular;  Laterality: N/A;  ? RECTAL SURGERY    ? RIGHT/LEFT HEART CATH AND CORONARY/GRAFT ANGIOGRAPHY N/A 09/23/2020  ? Procedure: RIGHT/LEFT HEART CATH AND CORONARY/GRAFT ANGIOGRAPHY;  Surgeon: Sherren Mocha, MD;  Location: [facility];  Service: Cardiovascular;  Laterality: N/A;  ? ROBOT ASSISTED LAPAROSCOPIC RADICAL PROSTATECTOMY  2005  ? TRANSCATHETER AORTIC VALVE REPLACEMENT, TRANSFEMORAL Bilateral 04/28/2021  ? Procedure:  TRANSCATHETER AORTIC VALVE REPLACEMENT, TRANSFEMORAL;  Surgeon: Burnell Blanks, MD;  Location: [facility];  Service: Open Heart Surgery;  Laterality: Bilateral;  ? ULTRASOUND GUIDANCE FOR VASCULAR ACCESS [place]

## 2022-02-06 NOTE — Assessment & Plan Note (Addendum)
Patient was admitted to the cardiac ward and he was placed on aggressive diuresis with IV furosemide, negative fluid balance was achieved, with significant improvement in his symptoms.  ? ?Echocardiogram with reduced LV systolic function with EF 30 to 35%, with global hypokinesis, severe reduction in RV systolic function. Mild enlargement of RV cavity, RVSP 41.9 mmHg. Positive perivalvular leak of the aortic prosthesis. (post TAVR).  ? ?Elevated troponin due to heart failure decompensation, ruled out acute coronary syndrome.  ? ?Patient will continue medical management with carvedilol and spironolactone.  ?Holding on ARB or entresto due to risk of worsening renal function.  ?SGLT2 when renal function more stable.  ? ?Periprosthetic aortic valve leak not new, continue medical therapy per cardiology recommendations.  ? ? ?

## 2022-02-06 NOTE — Assessment & Plan Note (Addendum)
Serum iron is 25, tibc 393, transferrin saturation is 6 and ferritin 281, consistent with iron deficiency combined with anemia of chronic diease. ? ?Patient received 2 doses of IV iron before his discharge and instruction to follow up iron panel as outpatient.  ?

## 2022-02-06 NOTE — Assessment & Plan Note (Addendum)
Baseline renal function with serum cr at 1,6  ?Hyponatremia.  ? ?Patient with improvement in volume status.  ?Patient will continue diuresis with torsemide 80 mg po daily and follow up renal function this week as outpatient with cardiology.  ?

## 2022-02-06 NOTE — Progress Notes (Signed)
?Progress Note ? ? ?Patient: [name] PZW:[id number] DOB: 1932/11/30 DOA: 02/05/2022     1 ?DOS: the patient was seen and examined on 02/06/2022 ?  ?Brief hospital course: ?Mr. Brandner was admitted to the hospital with the working diagnosis of decompensated heart failure.  ? ?88 yo male with the past medical history of coronary artery disease, heart failure, atrial fibrillation, hypertension, type 2 diabetes mellitus and chronic kidney disease who presented with dyspnea. Reported 2 to 3 weeks of worsening dyspnea, associated with orthopnea. Patient was evaluated by his primary care provider and his diuretic regimen was adjusted, unfortunately he continue to have worsening symptoms and he was referred to the ED for further evaluation. On his initial physical examination his blood pressure was 128/81, HR 81, RR 22 and 02 saturation 96% on room air. Lungs with diffuse rales bilaterally, no wheezing, poor inspiratory effort, heart with S1 and S2 present irregularly irregular, no JVD, abdomen not distended, positive pitting bilateral lower extremity +++.  ? ?Na 136, K 3,5 Cl 100, bicarbonate 25, glucose 157 bun 23 cr 1,76  ?Mg 1,8 ?BNP 1,573 ?High sensitive troponin 50 and 52 ?Wbc 8,9 hgb 11.2 plt 154  ? ?Chest radiograph with mild cardiomegaly, mild hilar vascular congestion, fluid in the right fissure and small left pleural effusion.  ? ?EKG 86 bpm, right axis deviation, left bundle branch block, atrial fibrillation,  J point elevation V2 to V3, negative T wave lead III and AvF.  ? ?Assessment and Plan: ?* Acute on chronic systolic CHF (congestive heart failure) ([place]) ?Echocardiogram with reduced LV systolic function with EF 30 to 35%, with global hypokinesis, severe reduction in RV systolic function. Mild enlargement of RV cavity, RVSP 41.9 mmHg. Positive perivalvular leak of the aortic prosthesis. (post TAVR).  ? ?Elevated troponin due to heart failure decompensation, ruled out acute coronary syndrome.  ? ?Urine output  documented at 300 cc ?Patient continue to have edema ?Blood pressure systolic is 423 to 536 mmHg. ? ?Plan to continue diuresis with furosemide, will increase dose to 60 mg IV q12 hrs  ?Continue with carvedilol and spironolactone.  ? ?Follow up on cardiology recommendations in regards of perivalvular (prosthetic) leaf, may need TEE for further evaluation.  ? ? ? ? ?Stage 3b chronic kidney disease (CKD) ([place]) ?Renal function with serum cr at 1,7 with K at 4,1 and serum bicarbonate at 28 ? ?Plan to continue diuresis with furosemide, increase dose to 60 mg and follow up renal function in am, avoid hypotension and nephrotoxic medications.  ? ?Atrial fibrillation, chronic ([place]) ?Continue rate control with carvedilol and anticoagulation with apixaban.  ? ?Hypertension ?Continue close blood pressure monitoring ?Continue diuresis furosemide ?On carvedilol.  ? ? ?Type 2 diabetes mellitus with hyperlipidemia ([place]) ?Continue glucose cover and monitoring with insulin sliding scale ?Patient is tolerating po well.  ? ?Continue with statin therapy.  ? ?Anemia ?Anemia of chronic disease. ?hgb has been stable ?Check iron panel  ? ? ? ? ?  ? ?Subjective: Patient continue to have dyspnea and edema, no chest pain  ? ?Physical Exam: ?Vitals:  ? 02/06/22 1400 02/06/22 1411 02/06/22 1437 02/06/22 1729  ?BP: 135/90  116/63 121/82  ?Pulse: 76  75 79  ?Resp: '17  14 14  '$ ?Temp:  97.9 ?F (36.6 ?C) 97.7 ?F (36.5 ?C)   ?TempSrc:  Oral Oral   ?SpO2: 97%  99%   ?Weight:   71.6 kg   ?Height:   '5\' 9"'$  (1.753 m)   ? ?Neurology  awake and alert ?ENT with mild pallor ?Cardiovascular with S1 and S2 present, irregularly irregular with positive murmur systolic at the right sternal border, 3/6, no gallops ?Moderate JVD ?Positive lower extremity edema pitting +++ ?Respiratory with rales bilaterally, no wheezing or rhonchi ?Abdomen not distended ? ?Data Reviewed: ? ? ? ?Family Communication: I spoke with patient's wife at the bedside, we talked in detail about  patient's condition, plan of care and prognosis and all questions were addressed. ? ? ?Disposition: ?Status is: Inpatient ?Remains inpatient appropriate because: heart failure  ? Planned Discharge Destination: Home ? ? ? ?Author: ?Tawni Millers, MD ?02/06/2022 5:33 PM ? ?For on call review [email or web address].  ?

## 2022-02-06 NOTE — ED Notes (Signed)
Breakfast order placed ?

## 2022-02-06 NOTE — Assessment & Plan Note (Addendum)
His glucose remained well controlled during his hospitalization.  ?He was placed on insulin sliding scale for glucose cover and monitoring. ?His fasting glucose at discharge is 145 mg/dl ?At discharge continue with Januvia  ? ?Continue with statin therapy.  ?

## 2022-02-06 NOTE — Progress Notes (Signed)
?  Echocardiogram ?2D Echocardiogram has been performed. ? ?Fidel Levy ?02/06/2022, 11:47 AM ?

## 2022-02-06 NOTE — Assessment & Plan Note (Addendum)
Blood pressure has been stable with systolic in the 182 to 993 mmHg range.  ?Continue with carvedilol and diuresis with torsemide.  ?

## 2022-02-06 NOTE — Progress Notes (Signed)
? ?  Patient seen earlier today consult by on call cardiologists.  Agree with that note and will see again in the AM 02/07/22.  Call with any questions.  ?

## 2022-02-06 NOTE — Hospital Course (Addendum)
Mr. Kirwan was admitted to the hospital with the working diagnosis of decompensated heart failure.  ? ?88 yo male with the past medical history of coronary artery disease, heart failure, atrial fibrillation, aortic stenosis sp TAVR, hypertension, type 2 diabetes mellitus and chronic kidney disease who presented with dyspnea. Reported 2 to 3 weeks of worsening dyspnea, associated with orthopnea. Patient was evaluated by his primary care provider and his diuretic regimen was adjusted, unfortunately he continue to have worsening symptoms and he was referred to the ED for further evaluation. On his initial physical examination his blood pressure was 128/81, HR 81, RR 22 and 02 saturation 96% on room air. Lungs with diffuse rales bilaterally, no wheezing, poor inspiratory effort, heart with S1 and S2 present irregularly irregular, no JVD, abdomen not distended, positive pitting bilateral lower extremity +++.  ? ?Na 136, K 3,5 Cl 100, bicarbonate 25, glucose 157 bun 23 cr 1,76  ?Mg 1,8 ?BNP 1,573 ?High sensitive troponin 50 and 52 ?Wbc 8,9 hgb 11.2 plt 154  ? ?Chest radiograph with mild cardiomegaly, mild hilar vascular congestion, fluid in the right fissure and small left pleural effusion.  ? ?EKG 86 bpm, right axis deviation, left bundle branch block, atrial fibrillation,  J point elevation V2 to V3, negative T wave lead III and AvF.  ? ?Patient was placed on aggressive diuresis with furosemide with improvement in his volume status. ?Plan to continue diuresis with torsemide at home, increased dose, and follow up renal function and electrolytes this week.  ?

## 2022-02-07 DIAGNOSIS — N1832 Chronic kidney disease, stage 3b: Secondary | ICD-10-CM | POA: Diagnosis not present

## 2022-02-07 DIAGNOSIS — I10 Essential (primary) hypertension: Secondary | ICD-10-CM | POA: Diagnosis not present

## 2022-02-07 DIAGNOSIS — I482 Chronic atrial fibrillation, unspecified: Secondary | ICD-10-CM | POA: Diagnosis not present

## 2022-02-07 DIAGNOSIS — I5023 Acute on chronic systolic (congestive) heart failure: Secondary | ICD-10-CM | POA: Diagnosis not present

## 2022-02-07 LAB — BASIC METABOLIC PANEL
Anion gap: 12 (ref 5–15)
BUN: 24 mg/dL — ABNORMAL HIGH (ref 8–23)
CO2: 24 mmol/L (ref 22–32)
Calcium: 9.8 mg/dL (ref 8.9–10.3)
Chloride: 100 mmol/L (ref 98–111)
Creatinine, Ser: 1.69 mg/dL — ABNORMAL HIGH (ref 0.61–1.24)
GFR, Estimated: 39 mL/min — ABNORMAL LOW (ref >60)
Glucose, Bld: 113 mg/dL — ABNORMAL HIGH (ref 70–99)
Potassium: 3.7 mmol/L (ref 3.5–5.1)
Sodium: 136 mmol/L (ref 135–145)

## 2022-02-07 LAB — IRON AND TIBC
Iron: 25 ug/dL — ABNORMAL LOW (ref 45–182)
Saturation Ratios: 6 % — ABNORMAL LOW (ref 17.9–39.5)
TIBC: 393 ug/dL (ref 250–450)
UIBC: 368 ug/dL

## 2022-02-07 LAB — GLUCOSE, CAPILLARY
Glucose-Capillary: 123 mg/dL — ABNORMAL HIGH (ref 70–99)
Glucose-Capillary: 155 mg/dL — ABNORMAL HIGH (ref 70–99)
Glucose-Capillary: 125 mg/dL — ABNORMAL HIGH (ref 70–99)
Glucose-Capillary: 163 mg/dL — ABNORMAL HIGH (ref 70–99)

## 2022-02-07 LAB — FERRITIN: Ferritin: 45 ng/mL (ref 24–336)

## 2022-02-07 LAB — TRANSFERRIN: Transferrin: 281 mg/dL (ref 180–329)

## 2022-02-07 MED ORDER — POTASSIUM CHLORIDE CRYS ER 20 MEQ PO TBCR
40.0000 meq | EXTENDED_RELEASE_TABLET | Freq: Once | ORAL | Status: AC
  Administered 2022-02-07: 40 meq via ORAL
  Filled 2022-02-07: qty 2

## 2022-02-07 MED ORDER — NA FERRIC GLUC CPLX IN SUCROSE 12.5 MG/ML IV SOLN
250.0000 mg | Freq: Every day | INTRAVENOUS | Status: AC
  Administered 2022-02-07 – 2022-02-08 (×2): 250 mg via INTRAVENOUS
  Filled 2022-02-07 (×2): qty 20

## 2022-02-07 NOTE — Progress Notes (Signed)
?Progress Note ? ? ?Patient: [name] GYI:[id number] DOB: March 28, 1933 DOA: 02/05/2022     2 ?DOS: the patient was seen and examined on 02/07/2022 ?  ?Brief hospital course: ?Mr. Votta was admitted to the hospital with the working diagnosis of decompensated heart failure.  ? ?88 yo male with the past medical history of coronary artery disease, heart failure, atrial fibrillation, aortic stenosis sp TAVR, hypertension, type 2 diabetes mellitus and chronic kidney disease who presented with dyspnea. Reported 2 to 3 weeks of worsening dyspnea, associated with orthopnea. Patient was evaluated by his primary care provider and his diuretic regimen was adjusted, unfortunately he continue to have worsening symptoms and he was referred to the ED for further evaluation. On his initial physical examination his blood pressure was 128/81, HR 81, RR 22 and 02 saturation 96% on room air. Lungs with diffuse rales bilaterally, no wheezing, poor inspiratory effort, heart with S1 and S2 present irregularly irregular, no JVD, abdomen not distended, positive pitting bilateral lower extremity +++.  ? ?Na 136, K 3,5 Cl 100, bicarbonate 25, glucose 157 bun 23 cr 1,76  ?Mg 1,8 ?BNP 1,573 ?High sensitive troponin 50 and 52 ?Wbc 8,9 hgb 11.2 plt 154  ? ?Chest radiograph with mild cardiomegaly, mild hilar vascular congestion, fluid in the right fissure and small left pleural effusion.  ? ?EKG 86 bpm, right axis deviation, left bundle branch block, atrial fibrillation,  J point elevation V2 to V3, negative T wave lead III and AvF.  ? ?Assessment and Plan: ?* Acute on chronic systolic CHF (congestive heart failure) ([place]) ?Echocardiogram with reduced LV systolic function with EF 30 to 35%, with global hypokinesis, severe reduction in RV systolic function. Mild enlargement of RV cavity, RVSP 41.9 mmHg. Positive perivalvular leak of the aortic prosthesis. (post TAVR).  ? ?Elevated troponin due to heart failure decompensation, ruled out acute coronary  syndrome.  ? ?Urine output documented at 2,275 cc ?Blood pressure systolic 350 to 093 mmHg.  ? ?Diuresis with furosemide at  60 mg IV q12 hrs  ?Continue with carvedilol and spironolactone.  ?Periprosthetic aortic valve leak not new, continue medical therapy per cardiology recommendations.  ? ? ? ?Stage 3b chronic kidney disease (CKD) ([place]) ?Patient with improvement in volume status.  ? ?Renal function today with serum cr at 1,69 with K at 3,7 and serum bicarbonate at 24.  ?Continue diuresis with furosemide 60 mg IV q12 hrs.  ?Spironolactone 12.5 mg daily.  ?Possible transition to oral diuretic therapy in am.  ? ?Atrial fibrillation, chronic ([place]) ?On Carvedilol for rate control and anticoagulation with apixaban.  ? ?Hypertension ?Continue close blood pressure monitoring ?Continue diuresis furosemide ?On carvedilol.  ? ? ?Type 2 diabetes mellitus with hyperlipidemia ([place]) ?Fasting glucose toady is 113.  ?Continue glucose cover and monitoring with insulin sliding scale ?Patient is tolerating po well.  ? ?Continue with statin therapy.  ? ?Anemia ?Serum iron is 25, tibc 393, transferrin saturation is 6 and ferritin 281, consistent with iron deficiency combined with anemia of chronic diease. ? ?Plan for IV iron x 2 doses.  ? ? ? ? ?  ? ?Subjective: patient with improvement in exertional dyspnea and edema, not back to his baseline yet.  ? ?Physical Exam: ?Vitals:  ? 02/07/22 0300 02/07/22 0400 02/07/22 0802 02/07/22 0830  ?BP: 107/68   117/77  ?Pulse:    77  ?Resp: 18   16  ?Temp: 98.2 ?F (36.8 ?C)   98.7 ?F (37.1 ?C)  ?TempSrc: Oral  Oral  ?SpO2: 97%  97% 98%  ?Weight:  70.8 kg    ?Height:  '5\' 9"'$  (1.753 m)    ? ?Neurology awake and alert ?ENT with mild pallor ?Cardiovascular with S1 and S2 present with no gallops or rubs, positive systolic murmur at the right sternal border, 3/6.  ?Respiratory with scattered rales bilaterally, no wheezing or rhonchi ?Abdomen not distended  ?Data Reviewed: ? ? ? ?Family Communication: I  spoke with patient's wife  at the bedside, we talked in detail about patient's condition, plan of care and prognosis and all questions were addressed. ? ? ?Disposition: ?Status is: Inpatient ?Remains inpatient appropriate because: heart failure  ? Planned Discharge Destination: Home ? ?Author: ?Tawni Millers, MD ?02/07/2022 12:01 PM ? ?For on call review [email or web address].  ?

## 2022-02-07 NOTE — Evaluation (Signed)
Occupational Therapy Evaluation and Discharge ?Patient Details ?Name: [name] ?MRN: [id number] ?DOB: [birth year] ?Today's Date: 02/07/2022 ? ? ?History of Present Illness [name] is an 88 yo male admitted 02/05/22 with worsening dyspnea, associated with orthopnea, dx with decompensated heart failure; CXR mild cardiomegaly, mild hilar vascular congestion, fluid in the right fissure and small left pleural effusion. PMH includes CAD/CABG,  HF, atrial fibrillation, HTN, type 2 diabetes mellitus and CKD  ? ?Clinical Impression ?  ?Pt is typically independent in ADL and mobility - enjoys wood working and getting on his tractor to [name], [name]. Today he is able to demonstrate UB, LB ADL, standing grooming, and transfers at supervision level (due to hospital setting more than need for assist) he is at his baseline functioning for ADL. 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   ?   ? ?Recommendations for follow up therapy are one component of a multi-disciplinary discharge planning process, led by the attending physician.  Recommendations may be updated based on patient status, additional functional criteria and insurance authorization.  ? ?Follow Up Recommendations ? No OT follow up  ?  ?Assistance Recommended at Discharge PRN  ?Patient can return home with the following Assistance with cooking/housework ? ?  ?Functional Status Assessment ? Patient has had a recent decline in their functional status and demonstrates the ability to make significant improvements in function in a reasonable and predictable amount of time.  ?Equipment Recommendations ? None recommended by OT  ?  ?Recommendations for Other Services PT consult ? ? ?  ?Precautions / Restrictions Precautions ?Precautions: Fall ?Restrictions ?Weight Bearing Restrictions: No  ? ?  ? ?Mobility Bed Mobility ?Overal bed mobility: Modified  Independent ?  ?  ?  ?  ?  ?  ?General bed mobility comments: HOB elevated slightly. ?  ? ?Transfers ?Overall transfer level: Needs assistance ?Equipment used: None ?Transfers: Sit to/from Stand ?Sit to Stand: Supervision ?  ?  ?  ?  ?  ?  ?  ? ?  ?Balance Overall balance assessment: Needs assistance ?Sitting-balance support: Feet supported, No upper extremity supported ?Sitting balance-Leahy Scale: Good ?Sitting balance - Comments: ABle to reach outside to donn socks without difficulty. ?  ?Standing balance support: During functional activity ?Standing balance-Leahy Scale: Fair ?  ?  ?  ?  ?  ?  ?  ?  ?  ?  ?  ?  ?   ? ?ADL either performed or assessed with clinical judgement  ? ?ADL Overall ADL's : At baseline ?  ?  ?  ?  ?  ?  ?  ?  ?  ?  ?  ?  ?  ?  ?  ?  ?  ?  ?  ?General ADL Comments: Pt able to perform standing grooming, don/doff socks EOB, transfers at supervision level. Pt with no questions or concerns about being able to perform ADL in home setting  ? ? ? ?Vision Baseline Vision/History: 1 Wears glasses ?Ability to See in Adequate Light: 0 Adequate ?Patient Visual Report: No change from baseline ?Vision Assessment?: No apparent visual deficits  ?   ?Perception   ?  ?Praxis   ?  ? ?Pertinent Vitals/Pain    ? ? ? ?Hand Dominance Right ?  ?Extremity/Trunk Assessment Upper Extremity Assessment ?Upper Extremity Assessment: Overall WFL for tasks assessed ?  ?  Lower Extremity Assessment ?Lower Extremity Assessment: Defer to PT evaluation ?  ?Cervical / Trunk Assessment ?Cervical / Trunk Assessment: Kyphotic ?  ?Communication Communication ?Communication: No difficulties ?  ?Cognition Arousal/Alertness: Awake/alert ?Behavior During Therapy: [facility] for tasks assessed/performed ?Overall Cognitive Status: Within Functional Limits for tasks assessed ?  ?  ?  ?  ?  ?  ?  ?  ?  ?  ?  ?  ?  ?  ?  ?  ?General Comments: tangential and enjoys conversation ?  ?  ?General Comments  VSS on RA ? ?  ?Exercises   ?  ?Shoulder  Instructions    ? ? ?Home Living Family/patient expects to be discharged to:: Private residence ?Living Arrangements: Spouse/significant other ?Available Help at Discharge: Family;Available 24 hours/day ?Type of Home: House ?Home Access: Stairs to enter ?Entrance Stairs-Number of Steps: 2 vs none in the back ?Entrance Stairs-Rails: None ?Home Layout: Two level;Able to live on main level with bedroom/bathroom ?  ?  ?Bathroom Shower/Tub: Walk-in shower ?  ?Bathroom Toilet: Handicapped height ?  ?  ?Home Equipment: Cane - single Barista (2 wheels);BSC/3in1 ?  ?  ?  ? ?  ?Prior Functioning/Environment Prior Level of Function : Independent/Modified Independent ?  ?  ?  ?  ?  ?  ?Mobility Comments: Uses SPC as needed. Drives. Does not do IADLs. 1 bad fall in last 6 months ?ADLs Comments: Wife helps with dressing. ?  ? ?  ?  ?OT Problem List: Decreased activity tolerance;Cardiopulmonary status limiting activity ?  ?   ?OT Treatment/Interventions:    ?  ?OT Goals(Current goals can be found in the care plan section) Acute Rehab OT Goals ?Patient Stated Goal: for my wife to get here ?OT Goal Formulation: With patient ?Time For Goal Achievement: 02/21/22 ?Potential to Achieve Goals: Good  ?OT Frequency:   ?  ? ?Co-evaluation   ?  ?  ?  ?  ? ?  ?AM-PAC OT "6 Clicks" Daily Activity     ?Outcome Measure Help from another person eating meals?: None ?Help from another person taking care of personal grooming?: None ?Help from another person toileting, which includes using toliet, bedpan, or urinal?: None ?Help from another person bathing (including washing, rinsing, drying)?: None ?Help from another person to put on and taking off regular upper body clothing?: None ?Help from another person to put on and taking off regular lower body clothing?: None ?6 Click Score: 24 ?  ?End of Session Equipment Utilized During Treatment: Gait belt ?Nurse Communication: Mobility status ? ?Activity Tolerance: Patient tolerated treatment  well ?Patient left: in bed;with call bell/phone within reach;with bed alarm set ? ?OT Visit Diagnosis: Muscle weakness (generalized) (M62.81)  ?              ?Time: 9373-4287 ?OT Time Calculation (min): 25 min ?Charges:  OT General Charges ?$OT Visit: 1 Visit ?OT Evaluation ?$OT Eval Low Complexity: 1 Low ? ?[name] ?Acute Rehabilitation Services ?Pager: [phone] ?Office: [phone] ? ?[name] ?02/07/2022, 1:12 PM ?

## 2022-02-07 NOTE — Progress Notes (Signed)
? ?Progress Note ? ?Patient Name: [name] ?Date of Encounter: 02/07/2022 ? ?Primary Cardiologist:   [name], MD ? ? ?Subjective  ? ?He is breathing better since admission.  He says his shortness of breath seems to come on relatively quickly.  He is not really describing chest discomfort this admission ? ?Inpatient Medications  ?  ?Scheduled Meds: ? apixaban  2.5 mg Oral BID  ? aspirin EC  81 mg Oral Daily  ? carvedilol  3.125 mg Oral BID WC  ? furosemide  60 mg Intravenous BID  ? insulin aspart  0-5 Units Subcutaneous QHS  ? insulin aspart  0-9 Units Subcutaneous TID WC  ? mometasone-formoterol  2 puff Inhalation BID  ? pantoprazole  40 mg Oral Daily  ? rosuvastatin  20 mg Oral Daily  ? sertraline  50 mg Oral Daily  ? spironolactone  12.5 mg Oral Daily  ? ?Continuous Infusions: ? ?PRN Meds: ?albuterol  ? ?Vital Signs  ?  ?Vitals:  ? 02/07/22 0300 02/07/22 0400 02/07/22 0802 02/07/22 0830  ?BP: 107/68   117/77  ?Pulse:    77  ?Resp: 18   16  ?Temp: 98.2 ?F (36.8 ?C)   98.7 ?F (37.1 ?C)  ?TempSrc: Oral   Oral  ?SpO2: 97%  97% 98%  ?Weight:  70.8 kg    ?Height:  '5\' 9"'$  (1.753 m)    ? ? ?Intake/Output Summary (Last 24 hours) at 02/07/2022 0935 ?Last data filed at 02/07/2022 0808 ?Gross per 24 hour  ?Intake 240 ml  ?Output 1600 ml  ?Net -1360 ml  ? ?Filed Weights  ? 02/05/22 1854 02/06/22 1437 02/07/22 0400  ?Weight: 75.8 kg 71.6 kg 70.8 kg  ? ? ?Telemetry  ?  ?Atrial fibrillation with controlled ventricular rate- Personally Reviewed ? ?ECG  ?  ?NA - Personally Reviewed ? ?Physical Exam  ? ?GEN: No acute distress.   ?Neck: No  JVD ?Cardiac: Irregular RR, soft apical systolic murmur, no diastolic murmurs, rubs, or gallops.  ?Respiratory:   Mildly decreased breath sounds bilaterally  ?GI: Soft, nontender, non-distended  ?MS:    Mild lower extremity edema; No deformity. ?Neuro:  Nonfocal  ?Psych: Normal affect  ? ?Labs  ?  ?Chemistry ?Recent Labs  ?Lab 02/05/22 ?1933 02/06/22 ?6144 02/07/22 ?0422  ?NA 136 139 136   ?K 3.5 4.1 3.7  ?CL 100 102 100  ?CO2 '26 28 24  '$ ?GLUCOSE 157* 121* 113*  ?BUN 23 21 24*  ?CREATININE 1.76* 1.77* 1.69*  ?CALCIUM 9.4 9.9 9.8  ?PROT  --  7.4  --   ?ALBUMIN  --  3.6  --   ?AST  --  15  --   ?ALT  --  17  --   ?ALKPHOS  --  146*  --   ?BILITOT  --  1.7*  --   ?GFRNONAA 37* 37* 39*  ?ANIONGAP '10 9 12  '$ ?  ? ?Hematology ?Recent Labs  ?Lab 02/05/22 ?1933 02/06/22 ?3154  ?WBC 8.9 10.3  ?RBC 4.75 5.05  ?HGB 11.2* 11.6*  ?HCT 35.3* 37.4*  ?MCV 74.3* 74.1*  ?MCH 23.6* 23.0*  ?MCHC 31.7 31.0  ?RDW 19.1* 19.3*  ?PLT 154 142*  ? ? ?Cardiac EnzymesNo results for input(s): TROPONINI in the last 168 hours. No results for input(s): TROPIPOC in the last 168 hours.  ? ?BNP ?Recent Labs  ?Lab 02/01/22 ?1003 02/05/22 ?1933  ?BNP  --  1,573.0*  ?PROBNP 1,846.0*  --   ?  ? ?DDimer No  results for input(s): DDIMER in the last 168 hours.  ? ?Radiology  ?  ?DG Chest 2 View ? ?Result Date: 02/05/2022 ?CLINICAL DATA:  Shortness of breath and worsening bilateral leg edema x1 month. EXAM: CHEST - 2 VIEW COMPARISON:  February 01, 2022 FINDINGS: Multiple sternal wires and vascular clips are seen. The cardiac silhouette is mildly enlarged and unchanged in size. An artificial aortic valve is present. Mild, diffuse, chronic appearing increased lung markings are seen. Very mild atelectatic changes are noted within the bilateral lung bases and lateral aspect of the mid right lung. There is a small left pleural effusion. No pneumothorax is identified. Multilevel degenerative changes seen throughout the thoracic spine. IMPRESSION: 1. Chronic appearing increased lung markings with mild bibasilar and mid right lung atelectasis. 2. Small left pleural effusion. Electronically Signed   By: Virgina Norfolk M.D.   On: 02/05/2022 19:36  ? ?ECHOCARDIOGRAM COMPLETE ? ?Result Date: 02/06/2022 ?   ECHOCARDIOGRAM REPORT   Patient Name:   [name] Date of Exam: 02/06/2022 Medical Rec #:  [id number]    Height:       69.0 in Accession #:    [id number]    Weight:       167.0 lb Date of Birth:  16-Oct-1933    BSA:          1.914 m? Patient Age:    88 years     BP:           128/83 mmHg Patient Gender: M            HR:           74 bpm. Exam Location:  Inpatient Procedure: 2D Echo, Cardiac Doppler, Color Doppler and Intracardiac            Opacification Agent Indications:    CHF-Acute Systolic R44.31  History:        Patient has prior history of Echocardiogram examinations, most                 recent 06/04/2021. CHF, CAD, Arrythmias:Atrial Fibrillation,                 Signs/Symptoms:Shortness of Breath and Dyspnea; Risk                 Factors:Hypertension and Diabetes.                 Aortic Valve: 29 mm Medtronic Evolut Pro( TAVR) valve is present                 in the aortic position. Procedure Date: 04/28/21.  Sonographer:    Bernadene Person RDCS Referring Phys: [id number] [place]  1. Left ventricular ejection fraction, by estimation, is 30 to 35%. Left ventricular ejection fraction by 2D MOD biplane is 35.0 %. The left ventricle has moderately decreased function. The left ventricle demonstrates global hypokinesis. There is mild left ventricular hypertrophy. Left ventricular diastolic function could not be evaluated. Elevated left ventricular end-diastolic pressure.  2. Right ventricular systolic function is severely reduced. The right ventricular size is mildly enlarged. There is mildly elevated pulmonary artery systolic pressure. The estimated right ventricular systolic pressure is 61.9 mmHg.  3. Left atrial size was moderately dilated.  4. The mitral valve is grossly normal. Trivial mitral valve regurgitation.  5. The aortic valve has been repaired/replaced. Aortic valve regurgitation is trivial. There is a 29 mm Medtronic Evolut Pro( TAVR) valve present in the aortic position. Procedure Date:  04/28/21. Echo findings are consistent with perivalvular leak of the  aortic prosthesis. Aortic regurgitation PHT measures 696 msec. Aortic valve area, by VTI  measures 2.77 cm?Marland Kitchen Aortic valve mean gradient measures 2.3 mmHg. Aortic valve Vmax measures 1.13 m/s.  6. Aortic dilatation noted. There is mild dilatation of the ascending aorta, measuring 40 mm.  7. The inferior vena cava is normal in size with <50% respiratory variability, suggesting right atrial pressure of 8 mmHg. Comparison(s): Changes from prior study are noted. 06/04/2021: LVEF 40-45%. FINDINGS  Left Ventricle: Left ventricular ejection fraction, by estimation, is 30 to 35%. Left ventricular ejection fraction by 2D MOD biplane is 35.0 %. The left ventricle has moderately decreased function. The left ventricle demonstrates global hypokinesis. Definity contrast agent was given IV to delineate the left ventricular endocardial borders. The left ventricular internal cavity size was normal in size. There is mild left ventricular hypertrophy. Left ventricular diastolic function could not be evaluated due to atrial fibrillation. Left ventricular diastolic function could not be evaluated. Elevated left ventricular end-diastolic pressure. Right Ventricle: The right ventricular size is mildly enlarged. No increase in right ventricular wall thickness. Right ventricular systolic function is severely reduced. There is mildly elevated pulmonary artery systolic pressure. The tricuspid regurgitant velocity is 2.91 m/s, and with an assumed right atrial pressure of 8 mmHg, the estimated right ventricular systolic pressure is 13.0 mmHg. Left Atrium: Left atrial size was moderately dilated. Right Atrium: Right atrial size was normal in size. Pericardium: There is no evidence of pericardial effusion. Mitral Valve: The mitral valve is grossly normal. Trivial mitral valve regurgitation. Tricuspid Valve: The tricuspid valve is grossly normal. Tricuspid valve regurgitation is mild. Aortic Valve: The aortic valve has been repaired/replaced. Aortic valve regurgitation is trivial. Aortic regurgitation PHT measures 696 msec. Aortic valve  mean gradient measures 2.3 mmHg. Aortic valve peak gradient measures 5.1 mmHg. Aortic valve area, by VTI measures 2.77 cm?Marland Kitchen There is a 29 mm Medtronic Evolut Pro( TAVR) valve present in the aortic posi

## 2022-02-07 NOTE — Plan of Care (Signed)

## 2022-02-07 NOTE — Evaluation (Signed)
Physical Therapy Evaluation ?Patient Details ?Name: [name] ?MRN: [id number] ?DOB: [birth year] ?Today's Date: 02/07/2022 ? ?History of Present Illness ? [name] is an 88 yo male admitted 02/05/22 with worsening dyspnea, associated with orthopnea, dx with decompensated heart failure; CXR mild cardiomegaly, mild hilar vascular congestion, fluid in the right fissure and small left pleural effusion. PMH includes CAD/CABG,  HF, atrial fibrillation, HTN, type 2 diabetes mellitus and CKD  ?Clinical Impression ? Patient presents with mild balance deficits, dyspnea on exertion, impaired cardiovascular endurance and impaired mobility s/p above. Pt reports needing some mild assist with ADLs in the last week from wife and uses SPC PRN. A few falls reported. Pt tolerated transfers and gait training with supervision for safety with 1 standing rest break due to SOB. VSS on RA. LIkely will progress well with increased activity. Will refer to mobility team. Will follow acutely to maximize independence and mobility prior to return home. ?   ? ?Recommendations for follow up therapy are one component of a multi-disciplinary discharge planning process, led by the attending physician.  Recommendations may be updated based on patient status, additional functional criteria and insurance authorization. ? ?Follow Up Recommendations No PT follow up ? ?  ?Assistance Recommended at Discharge PRN  ?Patient can return home with the following ? Help with stairs or ramp for entrance;Assistance with cooking/housework ? ?  ?Equipment Recommendations None recommended by PT  ?Recommendations for Other Services ?    ?  ?Functional Status Assessment Patient has had a recent decline in their functional status and demonstrates the ability to make significant improvements in function in a reasonable and predictable amount of time.  ? ?  ?Precautions / Restrictions Precautions ?Precautions: Fall ?Restrictions ?Weight Bearing Restrictions: No  ? ?   ? ?Mobility ? Bed Mobility ?Overal bed mobility: Modified Independent ?  ?  ?  ?  ?  ?  ?General bed mobility comments: HOB elevated slightly. ?  ? ?Transfers ?Overall transfer level: Needs assistance ?Equipment used: None ?Transfers: Sit to/from Stand ?Sit to Stand: Supervision ?  ?  ?  ?  ?  ?General transfer comment: SUpervision for safety. Stood from [employer]. ?  ? ?Ambulation/Gait ?Ambulation/Gait assistance: Supervision ?Gait Distance (Feet): 175 Feet ?Assistive device: None ?Gait Pattern/deviations: Step-through pattern, Decreased stride length, Drifts right/left ?Gait velocity: decreased ?Gait velocity interpretation: <1.31 ft/sec, indicative of household ambulator ?  ?General Gait Details: Slow, mostly steady and guarded gait with mild balance deficits, but no overt LOB. 1/4 DOE. 1 standing rest break. VSS on RA. ? ?Stairs ?  ?  ?  ?  ?  ? ?Wheelchair Mobility ?  ? ?Modified Rankin (Stroke Patients Only) ?  ? ?  ? ?Balance Overall balance assessment: Needs assistance ?Sitting-balance support: Feet supported, No upper extremity supported ?Sitting balance-Leahy Scale: Good ?Sitting balance - Comments: ABle to reach outside to donn socks without difficulty. ?  ?Standing balance support: During functional activity ?Standing balance-Leahy Scale: Fair ?  ?  ?  ?  ?  ?  ?  ?  ?  ?  ?  ?  ?   ? ? ? ?Pertinent Vitals/Pain Pain Assessment ?Pain Assessment: No/denies pain  ? ? ?Home Living Family/patient expects to be discharged to:: Private residence ?Living Arrangements: Spouse/significant other ?Available Help at Discharge: Family;Available 24 hours/day ?Type of Home: House ?Home Access: Stairs to enter ?Entrance Stairs-Rails: None ?Entrance Stairs-Number of Steps: 2 vs none in the back ?  ?Home Layout: Two level;Able to  live on main level with bedroom/bathroom ?Home Equipment: Cane - single Barista (2 wheels);BSC/3in1 ?   ?  ?Prior Function Prior Level of Function : Independent/Modified Independent ?   ?  ?  ?  ?  ?  ?Mobility Comments: Uses SPC as needed. Drives. Does not do IADLs. 1 bad fall in last 6 months ?ADLs Comments: Wife helps with dressing. ?  ? ? ?Hand Dominance  ? Dominant Hand: Right ? ?  ?Extremity/Trunk Assessment  ? Upper Extremity Assessment ?Upper Extremity Assessment: Defer to OT evaluation ?  ? ?Lower Extremity Assessment ?Lower Extremity Assessment: Overall WFL for tasks assessed ?  ? ?Cervical / Trunk Assessment ?Cervical / Trunk Assessment: Kyphotic  ?Communication  ? Communication: No difficulties  ?Cognition Arousal/Alertness: Awake/alert ?Behavior During Therapy: [facility] for tasks assessed/performed ?Overall Cognitive Status: Within Functional Limits for tasks assessed ?  ?  ?  ?  ?  ?  ?  ?  ?  ?  ?  ?  ?  ?  ?  ?  ?  ?  ?  ? ?  ?General Comments General comments (skin integrity, edema, etc.): VSS on RA ? ?  ?Exercises    ? ?Assessment/Plan  ?  ?PT Assessment Patient needs continued PT services  ?PT Problem List Decreased strength;Decreased mobility;Decreased balance;Cardiopulmonary status limiting activity ? ?   ?  ?PT Treatment Interventions Therapeutic exercise;Patient/family education;Therapeutic activities;Functional mobility training;Stair training;Balance training;Gait training   ? ?PT Goals (Current goals can be found in the Care Plan section)  ?Acute Rehab PT Goals ?Patient Stated Goal: to go home ?PT Goal Formulation: With patient ?Time For Goal Achievement: 02/21/22 ?Potential to Achieve Goals: Good ? ?  ?Frequency Min 3X/week ?  ? ? ?Co-evaluation   ?  ?  ?  ?  ? ? ?  ?AM-PAC PT "6 Clicks" Mobility  ?Outcome Measure Help needed turning from your back to your side while in a flat bed without using bedrails?: None ?Help needed moving from lying on your back to sitting on the side of a flat bed without using bedrails?: None ?Help needed moving to and from a bed to a chair (including a wheelchair)?: A Little ?Help needed standing up from a chair using your arms (e.g., wheelchair or  bedside chair)?: A Little ?Help needed to walk in hospital room?: A Little ?Help needed climbing 3-5 steps with a railing? : A Little ?6 Click Score: 20 ? ?  ?End of Session Equipment Utilized During Treatment: Gait belt ?Activity Tolerance: Patient tolerated treatment well ?Patient left: Other (comment) (standing in room with OT present) ?Nurse Communication: Mobility status ?PT Visit Diagnosis: Muscle weakness (generalized) (M62.81);Unsteadiness on feet (R26.81) ?  ? ?Time: 6222-9798 ?PT Time Calculation (min) (ACUTE ONLY): 18 min ? ? ?Charges:   PT Evaluation ?$PT Eval Moderate Complexity: 1 Mod ?  ?  ?   ? ? ?Marisa Severin, PT, DPT ?Acute Rehabilitation Services ?Secure chat preferred ?Office [phone] ? ? ? ? ?[place] ?02/07/2022, 10:50 AM ? ?

## 2022-02-08 ENCOUNTER — Other Ambulatory Visit (HOSPITAL_COMMUNITY): Payer: Self-pay

## 2022-02-08 DIAGNOSIS — I482 Chronic atrial fibrillation, unspecified: Secondary | ICD-10-CM | POA: Diagnosis not present

## 2022-02-08 DIAGNOSIS — N1832 Chronic kidney disease, stage 3b: Secondary | ICD-10-CM | POA: Diagnosis not present

## 2022-02-08 DIAGNOSIS — I5023 Acute on chronic systolic (congestive) heart failure: Secondary | ICD-10-CM | POA: Diagnosis not present

## 2022-02-08 DIAGNOSIS — I10 Essential (primary) hypertension: Secondary | ICD-10-CM | POA: Diagnosis not present

## 2022-02-08 LAB — BASIC METABOLIC PANEL
Anion gap: 9 (ref 5–15)
BUN: 24 mg/dL — ABNORMAL HIGH (ref 8–23)
CO2: 23 mmol/L (ref 22–32)
Calcium: 9.5 mg/dL (ref 8.9–10.3)
Chloride: 101 mmol/L (ref 98–111)
Creatinine, Ser: 1.88 mg/dL — ABNORMAL HIGH (ref 0.61–1.24)
GFR, Estimated: 34 mL/min — ABNORMAL LOW (ref >60)
Glucose, Bld: 145 mg/dL — ABNORMAL HIGH (ref 70–99)
Potassium: 3.7 mmol/L (ref 3.5–5.1)
Sodium: 133 mmol/L — ABNORMAL LOW (ref 135–145)

## 2022-02-08 LAB — GLUCOSE, CAPILLARY
Glucose-Capillary: 132 mg/dL — ABNORMAL HIGH (ref 70–99)
Glucose-Capillary: 161 mg/dL — ABNORMAL HIGH (ref 70–99)

## 2022-02-08 LAB — MAGNESIUM: Magnesium: 2 mg/dL (ref 1.7–2.4)

## 2022-02-08 MED ORDER — TORSEMIDE 20 MG PO TABS: 80.0000 mg | ORAL_TABLET | Freq: Every day | ORAL | Status: DC

## 2022-02-08 MED ORDER — TORSEMIDE 20 MG PO TABS
80.0000 mg | ORAL_TABLET | Freq: Every day | ORAL | 0 refills | Status: DC
Start: 2022-02-09 — End: 2022-02-15
  Filled 2022-02-08: qty 120, 30d supply, fill #0

## 2022-02-08 NOTE — TOC Initial Note (Signed)
Transition of Care (TOC) - Initial/Assessment Note  ? ? ?Patient Details  ?Name: [name] ?MRN: [id number] ?Date of Birth: 1933/10/01 ? ?Transition of Care (TOC) CM/SW Contact:    ?Zenon Mayo, RN ?Phone Number: ?02/08/2022, 10:46 AM ? ?Clinical Narrative:                 ?Patient is for dc today, NCM offered choice for [facility] , wife chose [place] .  NCM made referral to  [facility] with [facility]. He is able to take referral.  Soc will begin 24 to 48 hrs post dc.  His wife is at bedside, he has iron infusing and the Nurse states it will take 2 hrs to infuse.  ?  ? ?Expected Discharge Plan: [place] ?Barriers to Discharge: No Barriers Identified ? ? ?Patient Goals and CMS Choice ?Patient states their goals for this hospitalization and ongoing recovery are:: return home ?CMS Medicare.gov Compare Post Acute Care list provided to:: Patient Represenative (must comment) ?Choice offered to / list presented to : Spouse ? ?Expected Discharge Plan and Services ?Expected Discharge Plan: [place] ?  ?Discharge Planning Services: CM Consult ?Post Acute Care Choice: Home Health ?Living arrangements for the past 2 months: [place] ?Expected Discharge Date: 02/08/22               ?  ?DME Agency: NA ?  ?  ?  ?HH Arranged: RN ?[place] Agency: [place] ?Date HH Agency Contacted: 02/08/22 ?Time [place]: 8546 ?Representative spoke with at [place]: Tommi Rumps ? ?Prior Living Arrangements/Services ?Living arrangements for the past 2 months: [place] ?Lives with:: Spouse ?Patient language and need for interpreter reviewed:: Yes ?Do you feel safe going back to the place where you live?: Yes      ?Need for Family Participation in Patient Care: Yes (Comment) ?Care giver support system in place?: Yes (comment) ?  ?Criminal Activity/Legal Involvement Pertinent to Current Situation/Hospitalization: Yes - Comment as needed ? ?Activities of Daily Living ?  ?   ? ?Permission Sought/Granted ?  ?  ?   ?   ?   ?   ? ?Emotional Assessment ?Appearance:: Appears stated age ?Attitude/Demeanor/Rapport: Engaged ?Affect (typically observed): Appropriate ?Orientation: : Oriented to Self, Oriented to Place, Oriented to  Time, Oriented to Situation ?Alcohol / Substance Use: Not Applicable ?Psych Involvement: No (comment) ? ?Admission diagnosis:  Acute on chronic systolic congestive heart failure ([place]) [I50.23] ?Acute on chronic systolic CHF (congestive heart failure) ([place]) [I50.23] ?Patient Active Problem List  ? Diagnosis Date Noted  ? Stage 3b chronic kidney disease (CKD) ([place]) 02/06/2022  ? Type 2 diabetes mellitus with hyperlipidemia ([place])   ? Anemia   ? Atrial fibrillation, chronic ([place])   ? Acute on chronic systolic CHF (congestive heart failure) ([place]) 02/05/2022  ? Depression, recurrent ([place]) 09/30/2021  ? NSTEMI (non-ST elevated myocardial infarction) ([place])   ? S/P TAVR (transcatheter aortic valve replacement) 08/18/2021  ? Unstable angina ([place]) 08/18/2021  ? Severe aortic stenosis 04/29/2021  ? Aortic stenosis, severe 04/27/2021  ? Angina at rest [facility]) 09/20/2020  ? Chronic diastolic (congestive) heart failure ([place]) 09/20/2020  ? COPD with acute bronchitis ([place]) 09/08/2020  ? Acute exacerbation of CHF (congestive heart failure) ([place]) 09/07/2020  ? COPD mixed type ([place]) 04/03/2020  ? Chronic systolic HF (heart failure) ([place]) 12/26/2019  ? Systolic dysfunction with acute on chronic heart failure ([place]) 10/23/2019  ? Educated about COVID-19  virus infection 10/23/2019  ? Persistent atrial fibrillation ([place]) 08/29/2019  ? Nonrheumatic aortic valve stenosis 08/29/2019  ? Elevated troponin 07/27/2019  ? HLD (hyperlipidemia) 07/27/2019  ? GERD (gastroesophageal reflux disease) 07/27/2019  ? Congestive heart failure (CHF) ([place]) 07/26/2019  ? Pure hypercholesterolemia   ? Acute on chronic diastolic CHF (congestive heart failure) ([place])   ? SOB (shortness of breath) 06/04/2019  ? CKD  (chronic kidney disease), stage III ([place]) 06/04/2019  ? Coronary artery disease of native artery of native heart with stable angina pectoris ([place]) 03/24/2018  ? Bilateral carotid artery stenosis 07/23/2017  ? Palpitation 07/23/2017  ? Urinary urgency 07/28/2016  ? PAF (paroxysmal atrial fibrillation) ([place])   ? DOE (dyspnea on exertion) 07/17/2014  ? Acute on chronic renal insufficiency- ACE and diuretics held 07/17/2014  ? Diastolic dysfunction- moderate on echo 2013 07/17/2014  ? Aortic stenosis 07/17/2014  ? LBBB (left bundle branch block) 07/17/2014  ? Chest pain 07/17/2014  ? Crescendo angina ([place]) 07/16/2014  ? CKD (chronic kidney disease) stage 4, GFR 15-29 ml/min (HCC) 12/03/2013  ? Squamous cell cancer of skin of hand 05/31/2013  ? HEAD TRAUMA, CLOSED 09/24/2010  ? Ischemic cardiomyopathy- last EF Normal Myoview Jan 2015 04/02/2010  ? INSOMNIA 04/02/2010  ? Moderate bilateral ICA disease by doppler June 2015 01/08/2010  ? DYSPHAGIA UNSPECIFIED 09/05/2009  ? Hx GERD/PUD 09/03/2009  ? CHOLELITHIASIS, WITH CHOLECYSTITIS 09/03/2009  ? ARTHRITIS 07/16/2009  ? HYPOKALEMIA 11/06/2008  ? CAD in native artery 11/06/2008  ? 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 ([place]) 11/23/2007  ? Dyslipidemia 11/23/2007  ? ROSACEA 11/23/2007  ? DYSPNEA ON EXERTION 11/23/2007  ? UNS ADVRS EFF OTH RX MEDICINAL&BIOLOGICAL SBSTNC 11/23/2007  ? Hypertension 11/22/2007  ? Hx of Prostate ca 03/01/2004  ? ?PCP:  [name], MD ?Pharmacy:   ?[id number] [place] [place]?Phone: [phone]: [phone]? ?[facility]?Phone: [phone]: [phone] ? ? ? ? ?Social Determinants of Health (SDOH) Interventions ?  ? ?Readmission Risk Interventions ? ?  02/08/2022  ? 10:44 AM  ?Readmission Risk Prevention Plan  ?Transportation Screening Complete  ?PCP or Specialist Appt  within 3-5 Days Complete  ?[place] Home Care Consult Complete  ?Social Work Consult for [place]anning/Counseling Complete  ?Palliative Care Screening Not Applicable  ?Medication Review (Press photographer Complete  ? ? ? ?

## 2022-02-08 NOTE — Consult Note (Addendum)
? ?  THN CM Inpatient Consult ? ? ?02/08/2022 ? ?[name] ?03/01/1933 ?[id number] ? ?[facility] [[facility]]  Accountable Care Organization [ACO] Patient: [employer] Medicare ? ?Primary Care Provider:  Burchette, Bruce W, MD, [facility]  is Embedded with a team and program for Chronic Care Management  - this provider office is listed for the TOC calls and follow up ? ?Patient screened for hospitalization with noted high risk score for unplanned readmission risk and to assess for potential [facility]  [[facility]] Care Management service needs for post hospital transition.  Review of patient's medical record reveals patient is for home with home health nursing. ? ?Met with patient [Hard off [place] noted] and wife at the bedside for potential post hospital follow up needs from Embedded team for needs. Wife expressed cost of Eliquis would be an interest to see if it can be decreased.  She also had concerns for Aleve PM and Tylenol PM.  ? ?12:45 pm  Patient finishing up IV treatment. Met with patient and wife regarding [facility].  They verbalized understanding. ? ?Plan:  Will make a referral request for the Embedded team to review for Chronic Care Management vs Coordination of care needs for post hospital. ? ?For questions contact:  ? ?Victoria Brewer, RN BSN CCM ?[facility] Hospital Liaison ? [phone] business mobile phone ?Toll free office [phone]  ?Fax number: [phone] ?[email or web address] ?[email or web address]  ? ? ? ?

## 2022-02-08 NOTE — Progress Notes (Signed)
Physical Therapy Treatment ?Patient Details ?Name: [name] ?MRN: [id number] ?DOB: 1932/11/11 ?Today's Date: 02/08/2022 ? ? ?History of Present Illness [name] is an 88 yo male admitted 02/05/22 with worsening dyspnea, associated with orthopnea, dx with decompensated heart failure; CXR mild cardiomegaly, mild hilar vascular congestion, fluid in the right fissure and small left pleural effusion. PMH includes CAD/CABG,  HF, atrial fibrillation, HTN, type 2 diabetes mellitus and CKD ? ?  ?PT Comments  ? ? Pt received in supine, agreeable to therapy session with encouragement, with emphasis on safety with gait and transfers. Pt able to progress gait distance to ~147f with no AD and up to min guard for safety due to minor instability while turning and x2 episodes of scissoring gait. Pt able to self-correct without AD or and given min guard for safety but did not need any external physical support. Discussion on gradually increased activity/walking program at home to build strength/endurance and self-monitoring for activity tolerance. Pt continues to benefit from PT services to progress toward functional mobility goals.   ?Recommendations for follow up therapy are one component of a multi-disciplinary discharge planning process, led by the attending physician.  Recommendations may be updated based on patient status, additional functional criteria and insurance authorization. ? ?Follow Up Recommendations ? No PT follow up ?  ?  ?Assistance Recommended at Discharge PRN  ?Patient can return home with the following Help with stairs or ramp for entrance;Assistance with cooking/housework ?  ?Equipment Recommendations ? None recommended by PT  ?  ?Recommendations for Other Services   ? ? ?  ?Precautions / Restrictions Precautions ?Precautions: Fall ?Restrictions ?Weight Bearing Restrictions: No  ?  ? ?Mobility ? Bed Mobility ?Overal bed mobility: Modified Independent ?  ?  ?  ?  ?  ?  ?General bed mobility comments: pt  reliant on bed rails to raise trunk, he reports he sleeps in lift chair "after the first 2 hours in bed" every night. ?  ? ?Transfers ?Overall transfer level: Needs assistance ?Equipment used: None ?Transfers: Sit to/from Stand ?Sit to Stand: Supervision ?  ?  ?  ?  ?  ?General transfer comment: from EOB and to chair heights ?  ? ?Ambulation/Gait ?Ambulation/Gait assistance: Min guard ?Gait Distance (Feet): 190 Feet ?Assistive device: None ?Gait Pattern/deviations: Step-through pattern, Decreased stride length, Drifts right/left, Scissoring, Narrow base of support ?  ?  ?  ?General Gait Details: pt with x2 episodes of mild scissoring and consistent narrow BOS requiring up to min guard for safety, no overt LOB and pt able to self-correct balance without reaching out for furniture. SpO2/HR [facility]. ? ? ?Stairs ?  ?  ?  ?  ?  ? ? ?Wheelchair Mobility ?  ? ?Modified Rankin (Stroke Patients Only) ?  ? ? ?  ?Balance Overall balance assessment: Needs assistance ?Sitting-balance support: Feet supported, No upper extremity supported ?Sitting balance-Leahy Scale: Good ?  ?  ?Standing balance support: During functional activity ?Standing balance-Leahy Scale: Fair ?Standing balance comment: x2 mild LOB but able to correct with min guard at most; supervision to min guard without AD ?  ?  ?  ?  ?  ?  ?  ?  ?  ?  ?  ?  ? ?  ?Cognition Arousal/Alertness: Awake/alert ?Behavior During Therapy: [facility] tasks assessed/performed ?Overall Cognitive Status: Within Functional Limits for tasks assessed ?  ?  ?  ?  ?  ?  ?  ?  ?  ?  ?  ?  ?  ?  ?  ?  ?  General Comments: tangential and enjoys conversation, pt making jokes at time with sarcastic humor so can be difficult to tell if he is being serious or not. ?  ?  ? ?  ?Exercises Other Exercises ?Other Exercises: seated BLE AROM: "bicycle" x15 reps (reviewed hip flexion/LAQ prior and pt demostrates these in combination as he has LE bike at home) ?Other Exercises: discussed gradually  increased gait distances for walking program at home within tolerance ? ?  ?General Comments General comments (skin integrity, edema, etc.): HR 88-95 bpm with transfers, SpO2 96% on RA ?  ?  ? ?Pertinent Vitals/Pain Pain Assessment ?Pain Assessment: No/denies pain  ? ? ?   ?   ? ?PT Goals (current goals can now be found in the care plan section) Acute Rehab PT Goals ?Patient Stated Goal: to go home ?PT Goal Formulation: With patient ?Time For Goal Achievement: 02/21/22 ?Potential to Achieve Goals: Good ?Progress towards PT goals: Progressing toward goals ? ?  ?Frequency ? ? ? Min 3X/week ? ? ? ?  ?PT Plan Current plan remains appropriate  ? ? ?   ?AM-PAC PT "6 Clicks" Mobility   ?Outcome Measure ? Help needed turning from your back to your side while in a flat bed without using bedrails?: None ?Help needed moving from lying on your back to sitting on the side of a flat bed without using bedrails?: A Little ?Help needed moving to and from a bed to a chair (including a wheelchair)?: A Little ?Help needed standing up from a chair using your arms (e.g., wheelchair or bedside chair)?: A Little ?Help needed to walk in hospital room?: A Little ?Help needed climbing 3-5 steps with a railing? : A Little ?6 Click Score: 19 ? ?  ?End of Session Equipment Utilized During Treatment: Gait belt ?Activity Tolerance: Patient tolerated treatment well ?Patient left: in chair;with call bell/phone within reach;with family/visitor present ?Nurse Communication: Mobility status ?PT Visit Diagnosis: Muscle weakness (generalized) (M62.81);Unsteadiness on feet (R26.81) ?  ? ? ?Time: 1610-9604 ?PT Time Calculation (min) (ACUTE ONLY): 14 min ? ?Charges:  $Gait Training: 8-22 mins          ?          ? ?Shawn Leece P., PTA ?Acute Rehabilitation Services ?Secure Chat Preferred 9a-5:30pm ?Office: [phone]  ? ? ?[name] ?02/08/2022, 1:13 PM ? ?

## 2022-02-08 NOTE — TOC Transition Note (Signed)
Transition of Care (TOC) - CM/SW Discharge Note ? ? ?Patient Details  ?Name: [name] ?MRN: [id number] ?Date of Birth: 20-Dec-1932 ? ?Transition of Care (TOC) CM/SW Contact:  ?Zenon Mayo, RN ?Phone Number: ?02/08/2022, 10:14 AM ? ? ?Clinical Narrative:    ?Patient is for dc today, NCM offered choice for [facility] , wife chose [place] .  NCM made referral to  [facility]. He is able to take referral.  Soc will begin 24 to 48 hrs post dc.  His wife is at bedside, he has iron infusing and the Nurse states it will take 2 hrs to infuse.  ? ? ?Final next level of care: [place] ?Barriers to Discharge: No Barriers Identified ? ? ?Patient Goals and CMS Choice ?Patient states their goals for this hospitalization and ongoing recovery are:: return home ?CMS Medicare.gov Compare Post Acute Care list provided to:: Patient Represenative (must comment) ?Choice offered to / list presented to : Spouse ? ?Discharge Placement ?  ?           ?  ?  ?  ?  ? ?Discharge Plan and Services ?  ?  ?           ?  ?DME Agency: NA ?  ?  ?  ?HH Arranged: RN, Disease Management ?[place] Agency: [place] ?Date HH Agency Contacted: 02/08/22 ?Time [place]: 0037 ?Representative spoke with at [place]: Tommi Rumps ? ?Social Determinants of Health (SDOH) Interventions ?  ? ? ?Readmission Risk Interventions ?   ? View : No data to display.  ?  ?  ?  ? ? ? ? ? ?

## 2022-02-08 NOTE — Progress Notes (Signed)
? ?Progress Note ? ?Patient Name: [name] ?Date of Encounter: 02/08/2022 ? ?Primary Cardiologist:   [name], MD ? ? ?Subjective  ? ?Breathing has improved- net negative 1.6L overnight, now 3.8L negative. Creatinine is trending up to 1.88 today.  ? ?Inpatient Medications  ?  ?Scheduled Meds: ? apixaban  2.5 mg Oral BID  ? aspirin EC  81 mg Oral Daily  ? carvedilol  3.125 mg Oral BID WC  ? furosemide  60 mg Intravenous BID  ? insulin aspart  0-5 Units Subcutaneous QHS  ? insulin aspart  0-9 Units Subcutaneous TID WC  ? mometasone-formoterol  2 puff Inhalation BID  ? pantoprazole  40 mg Oral Daily  ? rosuvastatin  20 mg Oral Daily  ? sertraline  50 mg Oral Daily  ? spironolactone  12.5 mg Oral Daily  ? ?Continuous Infusions: ? ferric gluconate (FERRLECIT) IVPB Stopped (02/07/22 1801)  ? ?PRN Meds: ?albuterol  ? ?Vital Signs  ?  ?Vitals:  ? 02/07/22 1429 02/07/22 2047 02/07/22 2158 02/08/22 0325  ?BP: 118/84  106/81 118/80  ?Pulse: 75 81 80 81  ?Resp: '18 18 18 19  '$ ?Temp: (!) 97.5 ?F (36.4 ?C)  97.9 ?F (36.6 ?C) 98.2 ?F (36.8 ?C)  ?TempSrc: Oral  Oral Oral  ?SpO2: 99% 98% 99% 98%  ?Weight:    69.9 kg  ?Height:      ? ? ?Intake/Output Summary (Last 24 hours) at 02/08/2022 0856 ?Last data filed at 02/08/2022 [phone] ?Gross per 24 hour  ?Intake 990 ml  ?Output 2250 ml  ?Net -1260 ml  ? ?Filed Weights  ? 02/06/22 1437 02/07/22 0400 02/08/22 0325  ?Weight: 71.6 kg 70.8 kg 69.9 kg  ? ? ?Telemetry  ?  ?Atrial fibrillation with controlled ventricular rate- Personally Reviewed ? ?ECG  ?  ?NA - Personally Reviewed ? ?Physical Exam  ? ?GEN: No acute distress.   ?Neck: No  JVD ?Cardiac: Irregular RR, soft apical systolic murmur, no diastolic murmurs, rubs, or gallops.  ?Respiratory:   Mildly decreased breath sounds bilaterally  ?GI: Soft, nontender, non-distended  ?MS:    Mild lower extremity edema; No deformity. ?Neuro:  Nonfocal  ?Psych: Normal affect  ? ?Labs  ?  ?Chemistry ?Recent Labs  ?Lab 02/06/22 ?3007 02/07/22 ?0422  02/08/22 ?0406  ?NA 139 136 133*  ?K 4.1 3.7 3.7  ?CL 102 100 101  ?CO2 '28 24 23  '$ ?GLUCOSE 121* 113* 145*  ?BUN 21 24* 24*  ?CREATININE 1.77* 1.69* 1.88*  ?CALCIUM 9.9 9.8 9.5  ?PROT 7.4  --   --   ?ALBUMIN 3.6  --   --   ?AST 15  --   --   ?ALT 17  --   --   ?ALKPHOS 146*  --   --   ?BILITOT 1.7*  --   --   ?GFRNONAA 37* 39* 34*  ?ANIONGAP '9 12 9  '$ ?  ? ?Hematology ?Recent Labs  ?Lab 02/05/22 ?1933 02/06/22 ?6226  ?WBC 8.9 10.3  ?RBC 4.75 5.05  ?HGB 11.2* 11.6*  ?HCT 35.3* 37.4*  ?MCV 74.3* 74.1*  ?MCH 23.6* 23.0*  ?MCHC 31.7 31.0  ?RDW 19.1* 19.3*  ?PLT 154 142*  ? ? ?Cardiac EnzymesNo results for input(s): TROPONINI in the last 168 hours. No results for input(s): TROPIPOC in the last 168 hours.  ? ?BNP ?Recent Labs  ?Lab 02/01/22 ?1003 02/05/22 ?1933  ?BNP  --  1,573.0*  ?PROBNP 1,846.0*  --   ?  ? ?DDimer No results  for input(s): DDIMER in the last 168 hours.  ? ?Radiology  ?  ?ECHOCARDIOGRAM COMPLETE ? ?Result Date: 02/06/2022 ?   ECHOCARDIOGRAM REPORT   Patient Name:   [name] Date of Exam: 02/06/2022 Medical Rec #:  [id number]    Height:       69.0 in Accession #:    [id number]   Weight:       167.0 lb Date of Birth:  [birth year]    BSA:          1.914 m? Patient Age:    88 years     BP:           128/83 mmHg Patient Gender: M            HR:           74 bpm. Exam Location:  Inpatient Procedure: 2D Echo, Cardiac Doppler, Color Doppler and Intracardiac            Opacification Agent Indications:    CHF-Acute Systolic M19.62  History:        Patient has prior history of Echocardiogram examinations, most                 recent 06/04/2021. CHF, CAD, Arrythmias:Atrial Fibrillation,                 Signs/Symptoms:Shortness of Breath and Dyspnea; Risk                 Factors:Hypertension and Diabetes.                 Aortic Valve: 29 mm Medtronic Evolut Pro( TAVR) valve is present                 in the aortic position. Procedure Date: 04/28/21.  Sonographer:    Bernadene Person RDCS Referring Phys: [id number] [place]  1. Left ventricular ejection fraction, by estimation, is 30 to 35%. Left ventricular ejection fraction by 2D MOD biplane is 35.0 %. The left ventricle has moderately decreased function. The left ventricle demonstrates global hypokinesis. There is mild left ventricular hypertrophy. Left ventricular diastolic function could not be evaluated. Elevated left ventricular end-diastolic pressure.  2. Right ventricular systolic function is severely reduced. The right ventricular size is mildly enlarged. There is mildly elevated pulmonary artery systolic pressure. The estimated right ventricular systolic pressure is 21.1 mmHg.  3. Left atrial size was moderately dilated.  4. The mitral valve is grossly normal. Trivial mitral valve regurgitation.  5. The aortic valve has been repaired/replaced. Aortic valve regurgitation is trivial. There is a 29 mm Medtronic Evolut Pro( TAVR) valve present in the aortic position. Procedure Date: 04/28/21. Echo findings are consistent with perivalvular leak of the  aortic prosthesis. Aortic regurgitation PHT measures 696 msec. Aortic valve area, by VTI measures 2.77 cm?Marland Kitchen Aortic valve mean gradient measures 2.3 mmHg. Aortic valve Vmax measures 1.13 m/s.  6. Aortic dilatation noted. There is mild dilatation of the ascending aorta, measuring 40 mm.  7. The inferior vena cava is normal in size with <50% respiratory variability, suggesting right atrial pressure of 8 mmHg. Comparison(s): Changes from prior study are noted. 06/04/2021: LVEF 40-45%. FINDINGS  Left Ventricle: Left ventricular ejection fraction, by estimation, is 30 to 35%. Left ventricular ejection fraction by 2D MOD biplane is 35.0 %. The left ventricle has moderately decreased function. The left ventricle demonstrates global hypokinesis. Definity contrast agent was given IV to delineate the left ventricular endocardial borders. The left  ventricular internal cavity size was normal in size. There is mild left ventricular  hypertrophy. Left ventricular diastolic function could not be evaluated due to atrial fibrillation. Left ventricular diastolic function could not be evaluated. Elevated left ventricular end-diastolic pressure. Right Ventricle: The right ventricular size is mildly enlarged. No increase in right ventricular wall thickness. Right ventricular systolic function is severely reduced. There is mildly elevated pulmonary artery systolic pressure. The tricuspid regurgitant velocity is 2.91 m/s, and with an assumed right atrial pressure of 8 mmHg, the estimated right ventricular systolic pressure is 30.1 mmHg. Left Atrium: Left atrial size was moderately dilated. Right Atrium: Right atrial size was normal in size. Pericardium: There is no evidence of pericardial effusion. Mitral Valve: The mitral valve is grossly normal. Trivial mitral valve regurgitation. Tricuspid Valve: The tricuspid valve is grossly normal. Tricuspid valve regurgitation is mild. Aortic Valve: The aortic valve has been repaired/replaced. Aortic valve regurgitation is trivial. Aortic regurgitation PHT measures 696 msec. Aortic valve mean gradient measures 2.3 mmHg. Aortic valve peak gradient measures 5.1 mmHg. Aortic valve area, by VTI measures 2.77 cm?Marland Kitchen There is a 29 mm Medtronic Evolut Pro( TAVR) valve present in the aortic position. Procedure Date: 04/28/21. Pulmonic Valve: The pulmonic valve was grossly normal. Pulmonic valve regurgitation is mild. Aorta: Aortic dilatation noted. There is mild dilatation of the ascending aorta, measuring 40 mm. Venous: The inferior vena cava is normal in size with less than 50% respiratory variability, suggesting right atrial pressure of 8 mmHg. IAS/Shunts: No atrial level shunt detected by color flow Doppler.  LEFT VENTRICLE PLAX 2D                        Biplane EF (MOD) LVIDd:         4.60 cm         LV Biplane EF:   Left LVIDs:         4.10 cm                          ventricular LV PW:         1.20 cm                           ejection LV IVS:        1.40 cm                          fraction by LVOT diam:     2.10 cm                          2D MOD LV SV:         50                               biplane is LV SV Inde

## 2022-02-08 NOTE — Progress Notes (Signed)
Mobility Specialist Progress Note: ? ? 02/08/22 1040  ?Mobility  ?Activity Ambulated with assistance in hallway  ?Level of Assistance Standby assist, set-up cues, supervision of patient - no hands on  ?Assistive Device Other (Comment) ?(IV Pole)  ?Distance Ambulated (ft) 200 ft  ?Activity Response Tolerated well  ?$Mobility charge 1 Mobility  ? ?Pt eager for mobility session. Required minG throughout ambulation, pushing IV Pole. No SOB noted, pt pleased with improvement. Pt back in bed with visitors present.  ? ?Nelta Numbers ?Acute Rehab ?Phone: [phone] ?Office Phone: [phone] ? ?

## 2022-02-08 NOTE — Discharge Summary (Signed)
?Physician Discharge Summary ?  ?Patient: [name] MRN: [id number] DOB: July 16, 1933  ?Admit date:     02/05/2022  ?Discharge date: 02/08/22  ?Discharge Physician: Jimmy Picket Ira Busbin  ? ?PCP: [name], MD  ? ?Recommendations at discharge:  ? ? Patient was advised to decrease salt consumption and maintain fluid restriction 1500 ml per days. ?Increase dose of torsemide to 80 mg daily ?Follow up renal function this Friday with cardiology outpatient or earlier at primary care follow up visit.  ? ?Discharge Diagnoses: ?Principal Problem: ?  Acute on chronic systolic CHF (congestive heart failure) ([place]) ?Active Problems: ?  Stage 3b chronic kidney disease (CKD) ([place]) ?  Atrial fibrillation, chronic ([place]) ?  Hypertension ?  Type 2 diabetes mellitus with hyperlipidemia ([place]) ?  Anemia ? ?Resolved Problems: ?  * No resolved hospital problems. * ? ?Hospital Course: ?Mr. Matthews was admitted to the hospital with the working diagnosis of decompensated heart failure.  ? ?88 yo male with the past medical history of coronary artery disease, heart failure, atrial fibrillation, aortic stenosis sp TAVR, hypertension, type 2 diabetes mellitus and chronic kidney disease who presented with dyspnea. Reported 2 to 3 weeks of worsening dyspnea, associated with orthopnea. Patient was evaluated by his primary care provider and his diuretic regimen was adjusted, unfortunately he continue to have worsening symptoms and he was referred to the ED for further evaluation. On his initial physical examination his blood pressure was 128/81, HR 81, RR 22 and 02 saturation 96% on room air. Lungs with diffuse rales bilaterally, no wheezing, poor inspiratory effort, heart with S1 and S2 present irregularly irregular, no JVD, abdomen not distended, positive pitting bilateral lower extremity +++.  ? ?Na 136, K 3,5 Cl 100, bicarbonate 25, glucose 157 bun 23 cr 1,76  ?Mg 1,8 ?BNP 1,573 ?High sensitive troponin 50 and 52 ?Wbc 8,9 hgb 11.2 plt 154   ? ?Chest radiograph with mild cardiomegaly, mild hilar vascular congestion, fluid in the right fissure and small left pleural effusion.  ? ?EKG 86 bpm, right axis deviation, left bundle branch block, atrial fibrillation,  J point elevation V2 to V3, negative T wave lead III and AvF.  ? ?Patient was placed on aggressive diuresis with furosemide with improvement in his volume status. ?Plan to continue diuresis with torsemide at home, increased dose, and follow up renal function and electrolytes this week.  ? ?Assessment and Plan: ?* Acute on chronic systolic CHF (congestive heart failure) ([place]) ?Patient was admitted to the cardiac ward and he was placed on aggressive diuresis with IV furosemide, negative fluid balance was achieved, with significant improvement in his symptoms.  ? ?Echocardiogram with reduced LV systolic function with EF 30 to 35%, with global hypokinesis, severe reduction in RV systolic function. Mild enlargement of RV cavity, RVSP 41.9 mmHg. Positive perivalvular leak of the aortic prosthesis. (post TAVR).  ? ?Elevated troponin due to heart failure decompensation, ruled out acute coronary syndrome.  ? ?Patient will continue medical management with carvedilol and spironolactone.  ?Holding on ARB or entresto due to risk of worsening renal function.  ?SGLT2 when renal function more stable.  ? ?Periprosthetic aortic valve leak not new, continue medical therapy per cardiology recommendations.  ? ? ? ?Stage 3b chronic kidney disease (CKD) ([place]) ?Baseline renal function with serum cr at 1,6  ?Hyponatremia.  ? ?Patient with improvement in volume status.  ?Patient will continue diuresis with torsemide 80 mg po daily and follow up renal function this week as outpatient with  cardiology.  ? ?Atrial fibrillation, chronic ([place]) ?Patient remained rate controlled with carvedilol for rate control and anticoagulation with apixaban.  ? ?Hypertension ?Blood pressure has been stable with systolic in the 275 to 170 mmHg  range.  ?Continue with carvedilol and diuresis with torsemide.  ? ?Type 2 diabetes mellitus with hyperlipidemia ([place]) ?His glucose remained well controlled during his hospitalization.  ?He was placed on insulin sliding scale for glucose cover and monitoring. ?His fasting glucose at discharge is 145 mg/dl ?At discharge continue with Januvia  ? ?Continue with statin therapy.  ? ?Anemia ?Serum iron is 25, tibc 393, transferrin saturation is 6 and ferritin 281, consistent with iron deficiency combined with anemia of chronic diease. ? ?Patient received 2 doses of IV iron before his discharge and instruction to follow up iron panel as outpatient.  ? ? ? ? ?  ? ? ?Consultants: cardiology  ?Procedures performed: none   ?Disposition: Home ?Diet recommendation:  ?Cardiac diet ?DISCHARGE MEDICATION: ?Allergies as of 02/08/2022   ? ?   Reactions  ? Codeine Other (See Comments)  ? Makes him feel goofy  ? Morphine Other (See Comments)  ? Nightmares and felt bad  ? ?  ? ?  ?Medication List  ?  ? ?TAKE these medications   ? ?albuterol 108 (90 Base) MCG/ACT inhaler ?Commonly known as: VENTOLIN HFA ?Inhale 2 puffs into the lungs every 6 (six) hours as needed. ?What changed: reasons to take this ?  ?amLODipine 5 MG tablet ?Commonly known as: NORVASC ?TAKE 1 TABLET BY MOUTH EVERY DAY ?  ?BD Pen Needle Nano 2nd Gen 32G X 4 MM Misc ?Generic drug: Insulin Pen Needle ?USE AS DIRECTED. ?  ?blood glucose meter kit and supplies Kit ?Dispense based on patient and insurance preference. Use up to four times daily as directed. (FOR ICD-9 250.00, 250.01). ?  ?budesonide-formoterol 160-4.5 MCG/ACT inhaler ?Commonly known as: Symbicort ?Inhale 2 puffs into the lungs in the morning and at bedtime. ?What changed:  ?when to take this ?reasons to take this ?  ?carvedilol 6.25 MG tablet ?Commonly known as: COREG ?TAKE 1 TABLET (6.25 MG TOTAL) BY MOUTH 2 (TWO) TIMES DAILY WITH A MEAL. ?  ?clopidogrel 75 MG tablet ?Commonly known as: PLAVIX ?Take 1 tablet  (75 mg total) by mouth daily with breakfast. ?  ?docusate sodium 100 MG capsule ?Commonly known as: Stool Softener ?Take 1 capsule (100 mg total) by mouth 2 (two) times daily as needed for mild constipation. ?  ?Eliquis 2.5 MG Tabs tablet ?Generic drug: apixaban ?TAKE 1 TABLET BY MOUTH TWICE A DAY ?What changed: how much to take ?  ?hydrocortisone 2.5 % cream ?Apply 1 application topically daily as needed (facial breakouts). ?  ?ICAPS AREDS 2 PO ?Take 1 capsule by mouth daily. ?  ?Insulin Degludec 100 UNIT/ML Soln ?Commonly known as: [place] ?Inject 10 Units into the skin daily. ?  ?Januvia 50 MG tablet ?Generic drug: sitaGLIPtin ?TAKE 1 TABLET BY MOUTH EVERY DAY ?What changed: how much to take ?  ?LUBRICATING EYE DROPS OP ?Place 1 drop into both eyes daily as needed (dry eyes). ?  ?nitroGLYCERIN 0.4 MG SL tablet ?Commonly known as: NITROSTAT ?PLACE 1 TABLET UNDER THE TONGUE EVERY 5 MINUTES X 3 DOSES AS NEEDED FOR CHEST PAIN ?  ?pantoprazole 40 MG tablet ?Commonly known as: PROTONIX ?TAKE 1 TABLET BY MOUTH EVERY DAY ?  ?potassium chloride 10 MEQ tablet ?Commonly known as: [place]-Con 10 ?Take 1 tablet (10 mEq total) by mouth daily. ?  ?  rosuvastatin 20 MG tablet ?Commonly known as: CRESTOR ?TAKE 1 TABLET BY MOUTH EVERY DAY ?  ?sertraline 50 MG tablet ?Commonly known as: ZOLOFT ?TAKE 1 TABLET BY MOUTH EVERY DAY ?  ?spironolactone 25 MG tablet ?Commonly known as: ALDACTONE ?TAKE 1 TABLET (25 MG TOTAL) BY MOUTH DAILY. ?What changed: how much to take ?  ?[place] ?Apply 1 application topically daily as needed (knee pain). ?  ?Torsemide 40 MG Tabs ?Take 80 mg by mouth daily. ?Start taking on: February 09, 2022 ?What changed:  ?medication strength ?how much to take ?additional instructions ?  ?VITAMIN B-12 PO ?Take 1 tablet by mouth daily. ?  ? ?  ? ? Follow-up Information   ? ? [name], MD. Go on 02/10/2022.   ?Specialty: Family Medicine ?Why: _0 :30am ?Contact information: ?[address] ?[place] [address] ?[phone] ? ? ?  ?  ? ? [name], MD .   ?Specialty: Cardiology ?Contact information: ?[address] ?[address] ?[place] [address] ?[phone] ? ? ?  ?  ? ?  ?  ? ?  ? ?Dis

## 2022-02-08 NOTE — Progress Notes (Signed)
[name] to be D/C'd home with home health per MD order. Discussed with the patient and wife and all questions fully answered.  ?Skin clean and dry. ?IV catheter discontinued intact. Site without signs and symptoms of complications. Dressing and pressure applied.  ?An After Visit Summary was printed and given to the patient. Medication delivered from [facility]. ?Patient escorted via [place], and D/C home via private auto.  ?[name]  ?02/08/2022 1:43 PM ?  ?   ?

## 2022-02-09 ENCOUNTER — Telehealth: Payer: Self-pay

## 2022-02-09 NOTE — Telephone Encounter (Signed)
Transition Care Management Follow-up Telephone Call ?Date of discharge and from where: [facility] 02-09-22 Dx: acute on chronic CHF  ?How have you been since you were released from the hospital? Doing good  ?Any questions or concerns? no ? ?Items Reviewed: ?Did the pt receive and understand the discharge instructions provided? Yes  ?Medications obtained and verified? Yes  ?Other? No  ?Any new allergies since your discharge? No  ?Dietary orders reviewed? Yes ?Do you have support at home? Yes  ? ?Home Care and Equipment/Supplies: ?Were home health services ordered? yes ?If so, what is the name of the agency? [facility] ?Has the agency set up a time to come to the patient's home? yes ?Were any new equipment or medical supplies ordered?  No ?What is the name of the medical supply agency? na ?Were you able to get the supplies/equipment? not applicable ?Do you have any questions related to the use of the equipment or supplies? No ? ?Functional Questionnaire: (I = Independent and D = Dependent) ?ADLs: I ? ?Bathing/Dressing- I ? ?Meal Prep- I ? ?Eating- I ? ?Maintaining continence- I ? ?Transferring/Ambulation- I ? ?Managing Meds- D ? ?Follow up appointments reviewed: ? ?PCP Hospital f/u appt confirmed? Yes  Scheduled to see Dr Elease Hashimoto  on 02-10-22 @ 1130am. ?[facility] f/u appt confirmed? Yes  Scheduled to see Dr Percival Spanish on 02-15-22 @ 940am. ?Are transportation arrangements needed? No  ?If their condition worsens, is the pt aware to call PCP or go to the Emergency Dept.? Yes ?Was the patient provided with contact information for the PCP's office or ED? Yes ?Was to pt encouraged to call back with questions or concerns? Yes  ?

## 2022-02-10 ENCOUNTER — Ambulatory Visit: Payer: Medicare PPO | Admitting: Family Medicine

## 2022-02-10 ENCOUNTER — Encounter: Payer: Self-pay | Admitting: Family Medicine

## 2022-02-10 VITALS — BP 124/68 | HR 84 | Temp 97.5°F | Ht 69.0 in | Wt 156.2 lb

## 2022-02-10 DIAGNOSIS — E1121 Type 2 diabetes mellitus with diabetic nephropathy: Secondary | ICD-10-CM | POA: Diagnosis not present

## 2022-02-10 DIAGNOSIS — N189 Chronic kidney disease, unspecified: Secondary | ICD-10-CM | POA: Diagnosis not present

## 2022-02-10 DIAGNOSIS — I4819 Other persistent atrial fibrillation: Secondary | ICD-10-CM

## 2022-02-10 DIAGNOSIS — D509 Iron deficiency anemia, unspecified: Secondary | ICD-10-CM

## 2022-02-10 DIAGNOSIS — Z79899 Other long term (current) drug therapy: Secondary | ICD-10-CM | POA: Diagnosis not present

## 2022-02-10 DIAGNOSIS — I5023 Acute on chronic systolic (congestive) heart failure: Secondary | ICD-10-CM

## 2022-02-10 DIAGNOSIS — Z5181 Encounter for therapeutic drug level monitoring: Secondary | ICD-10-CM | POA: Diagnosis not present

## 2022-02-10 DIAGNOSIS — N289 Disorder of kidney and ureter, unspecified: Secondary | ICD-10-CM

## 2022-02-10 LAB — BASIC METABOLIC PANEL
BUN: 26 mg/dL — ABNORMAL HIGH (ref 6–23)
CO2: 24 mEq/L (ref 19–32)
Calcium: 9.6 mg/dL (ref 8.4–10.5)
Chloride: 99 mEq/L (ref 96–112)
Creatinine, Ser: 1.55 mg/dL — ABNORMAL HIGH (ref 0.40–1.50)
GFR: 40.16 mL/min — ABNORMAL LOW (ref >60.00)
Glucose, Bld: 178 mg/dL — ABNORMAL HIGH (ref 70–99)
Potassium: 4 mEq/L (ref 3.5–5.1)
Sodium: 133 mEq/L — ABNORMAL LOW (ref 135–145)

## 2022-02-10 NOTE — Patient Instructions (Signed)
KEEP DAILY SODIUM < 1,500 MG  ? ?DAILY WEIGHTS AND FOLLOW UP FOR WEIGHT GAIN > 5 POUNDS IN ONE WEEK OR TWO POUNDS IN ONE DAY ? ? ?

## 2022-02-10 NOTE — Progress Notes (Signed)
? ?Established Patient Office Visit ? ?Subjective   ?Patient ID: [name], male    DOB: Dec 04, 1932  Age: 88 y.o. MRN: [id number] ? ?Chief Complaint  ?Patient presents with  ? Hospitalization Follow-up  ? ? ?HPI ? ? ?[name] is seen for hospital follow-up.  He has multiple chronic medical problems including history of CAD, chronic combined systolic and diastolic heart failure, history of aortic stenosis with previous TAVR, hypertension, type 2 diabetes, atrial fibrillation on anticoagulation with Eliquis, chronic kidney disease.  Had been seen here recently with some weight gain and increased dyspnea.  We increased his torsemide up to 60 mg daily but he did not see much improvement.  He was advised to go to the ER on the 21st after speaking with patient and his wife and he was admitted.  His O2 sats were 96% and vital signs were stable.  Chest x-ray shows small pleural effusion.  BNP level 1573.  Hemoglobin 11.2 ? ?Patient received aggressive IV diuresis with furosemide and achieve negative fluid balance.  He lost almost 20 pounds.  Echocardiogram showed EF 30 to 35% with global hypokinesis.  Also noted was some perivalvular leak of the aortic prosthesis.  He was continued on carvedilol and Aldactone. ? ?He had normal ferritin but low iron.  Hemoglobin usually around 13 range and this is down to 11.6.  He did receive iron infusion x2. ? ?Type 2 diabetes history.  A1c in January 7.2.  He is on low-dose Januvia.  Would be a good candidate for SGLT2 if renal function improves. ? ?Patient overall much improved.  No orthopnea.  No dyspnea at rest.  No chest pains.  Peripheral edema has resolved.  Home weights have been stable since discharge. ? ?Past Medical History:  ?Diagnosis Date  ? Aortic stenosis, moderate 09/20/2020  ? Arthritis   ? CAD (coronary artery disease)   ? a. Cath September 2015 LIMA to the LAD patent, SVG to PDA patent, SVG to posterior lateral patent, SVG to OM with a 90% in-stent restenosis at an  anastomotic lesion. This was treated with angioplasty. b. cath 04/01/2015 95% ISR in SVG to OM treated with 2.75x24 Synergy DES postdilated to 3.3m, all other grafts patent  ? Cataract   ? surgery,B/L  ? CHF (congestive heart failure) ([facility]   ? Chronic kidney disease   ? nephrolithiasis  ? Diabetes mellitus   ? TYPE 2  ? GERD (gastroesophageal reflux disease)   ? Hernia   ? Hypercholesterolemia   ? Hypertension   ? Incontinence   ? hx  over 1 year,leaks without awareness  ? Other and unspecified diseases of appendix   ? PAF (paroxysmal atrial fibrillation) ([facility]   ? in the setting of ischemia on 03/31/2015  ? Pancreatitis   ? Prostate CA ([facility] 03/01/04  ? prostate bx=Adenocarcinoam,gleason 3+4=7,PSA=6.75  ? Shortness of breath dyspnea   ? Ulcer   ? hx gastric  ? ?Past Surgical History:  ?Procedure Laterality Date  ? APPENDECTOMY    ? BACK SURGERY    ? CARDIAC CATHETERIZATION N/A 04/01/2015  ? Procedure: Left Heart Cath and Cors/Grafts Angiography;  Surgeon: JJettie Booze MD;  [facility];  Service: Cardiovascular;  Laterality: N/A;  ? CARDIAC CATHETERIZATION N/A 04/01/2015  ? Procedure: Coronary Stent Intervention;  Surgeon: JJettie Booze MD;  [facility];  Service: Cardiovascular;  Laterality: N/A;  ? CARDIOVERSION N/A 11/01/2019  ? Procedure: CARDIOVERSION;  Surgeon: OGeralynn Rile  MD;  Location: [facility];  Service: Cardiovascular;  Laterality: N/A;  ? [place]  2004  ? both hands  ? CORONARY ARTERY BYPASS GRAFT    ? CORONARY BALLOON ANGIOPLASTY N/A 08/19/2021  ? Procedure: CORONARY BALLOON ANGIOPLASTY;  Surgeon: Burnell Blanks, MD;  Location: [facility];  Service: Cardiovascular;  Laterality: N/A;  ? CORONARY STENT INTERVENTION N/A 09/23/2020  ? Procedure: CORONARY STENT INTERVENTION;  Surgeon: Sherren Mocha, MD;  Location: [facility];  Service: Cardiovascular;  Laterality: N/A;  ? CYSTOSCOPY  08/23/12  ? incomplete  emoptying bladder  ? HERNIA REPAIR    ? incision and drainage of right chest abscess    ? INTRAOPERATIVE TRANSTHORACIC ECHOCARDIOGRAM Left 04/28/2021  ? Procedure: INTRAOPERATIVE TRANSTHORACIC ECHOCARDIOGRAM;  Surgeon: Burnell Blanks, MD;  Location: [facility];  Service: Open Heart Surgery;  Laterality: Left;  ? LAPAROSCOPIC CHOLECYSTECTOMY  09/2009  ? LEFT HEART CATH AND CORS/GRAFTS ANGIOGRAPHY N/A 08/19/2021  ? Procedure: LEFT HEART CATH AND CORS/GRAFTS ANGIOGRAPHY;  Surgeon: Burnell Blanks, MD;  Location: [facility];  Service: Cardiovascular;  Laterality: N/A;  ? LEFT HEART CATHETERIZATION WITH CORONARY ANGIOGRAM N/A 07/19/2014  ? Procedure: LEFT HEART CATHETERIZATION WITH CORONARY ANGIOGRAM;  Surgeon: Leonie Man, MD;  [facility];  Service: Cardiovascular;  Laterality: N/A;  ? RECTAL SURGERY    ? RIGHT/LEFT HEART CATH AND CORONARY/GRAFT ANGIOGRAPHY N/A 09/23/2020  ? Procedure: RIGHT/LEFT HEART CATH AND CORONARY/GRAFT ANGIOGRAPHY;  Surgeon: Sherren Mocha, MD;  Location: [facility];  Service: Cardiovascular;  Laterality: N/A;  ? ROBOT ASSISTED LAPAROSCOPIC RADICAL PROSTATECTOMY  2005  ? TRANSCATHETER AORTIC VALVE REPLACEMENT, TRANSFEMORAL Bilateral 04/28/2021  ? Procedure: TRANSCATHETER AORTIC VALVE REPLACEMENT, TRANSFEMORAL;  Surgeon: Burnell Blanks, MD;  Location: [facility];  Service: Open Heart Surgery;  Laterality: Bilateral;  ? ULTRASOUND GUIDANCE FOR VASCULAR ACCESS Bilateral 04/28/2021  ? Procedure: ULTRASOUND GUIDANCE FOR VASCULAR ACCESS;  Surgeon: Burnell Blanks, MD;  Location: [facility];  Service: Open Heart Surgery;  Laterality: Bilateral;  ? ? reports that he quit smoking about 49 years ago. His smoking use included cigarettes. He has a 2.50 pack-year smoking history. He has never used smokeless tobacco. He reports that he does not drink alcohol and does not use drugs. ?family history includes Cancer in his mother; Heart attack in his father. ?Allergies   ?Allergen Reactions  ? Codeine Other (See Comments)  ?  Makes him feel goofy  ? Morphine Other (See Comments)  ?  Nightmares and felt bad  ? ? ?Review of Systems  ?Constitutional:  Negative for fever.  ?Respiratory:  Negative for cough and shortness of breath.   ?Cardiovascular:  Negative for chest pain and leg swelling.  ?Gastrointestinal:  Negative for diarrhea.  ?Neurological:  Negative for dizziness.  ? ?  ?Objective:  ?  ? ?BP 124/68 (BP Location: Left Arm, Patient Position: Sitting, Cuff Size: Normal)   Pulse 84   Temp (!) 97.5 ?F (36.4 ?C) (Oral)   Ht '5\' 9"'$  (1.753 m)   Wt 156 lb 3.2 oz (70.9 kg)   SpO2 98%   BMI 23.07 kg/m?  ?Wt Readings from Last 3 Encounters:  ?02/10/22 156 lb 3.2 oz (70.9 kg)  ?02/08/22 154 lb 1.6 oz (69.9 kg)  ?02/01/22 175 lb 12.8 oz (79.7 kg)  ? ?  ? ?Physical Exam ?Vitals reviewed.  ?Constitutional:   ?   Appearance: Normal appearance.  ?Cardiovascular:  ?   Rate and Rhythm: Normal  rate.  ?Pulmonary:  ?   Effort: Pulmonary effort is normal.  ?   Comments: Still has a few faint crackles in both bases.  No wheezes. ?Musculoskeletal:  ?   Right lower leg: No edema.  ?   Left lower leg: No edema.  ?Neurological:  ?   Mental Status: He is alert.  ? ? ? ?No results found for any visits on 02/10/22. ? ? ? ?The ASCVD Risk score (Arnett DK, et al., 2019) failed to calculate for the following reasons: ?  The 2019 ASCVD risk score is only valid for ages 58 to 10 ?  The patient has a prior MI or stroke diagnosis ? ?  ?Assessment & Plan:  ? ?#1 recent acute on chronic systolic heart failure.  Patient had substantial weight gain with increasing BNP level in spite of increased outpatient dose of torsemide.  Improved with IV diuresis.  Currently on oral torsemide 80 mg daily and stable ? ?-Continue daily weights and be in touch if weight gain 2 pounds or more in 1 day or 5 pounds 1 week ?-Try to keep daily sodium intake less than 1500 mg ?-Consider low-dose Jardiance if renal function  stabilizes ? ?#2 type 2 diabetes.  History of fairly good control on low-dose insulin.  Also takes low-dose Januvia.  Would favor low-dose Jardiance over Januvia if renal function improves as above ? ?#3 chronic kidney dise

## 2022-02-11 ENCOUNTER — Telehealth: Payer: Self-pay | Admitting: *Deleted

## 2022-02-11 ENCOUNTER — Telehealth: Payer: Medicare PPO

## 2022-02-11 NOTE — Chronic Care Management (AMB) (Signed)
?  Care Management  ? ?Note ? ?02/11/2022 ?Name: NICOLI NARDOZZI MRN: [id number] DOB: 08-01-1933 ? ?AKASH WINSKI is a 88 y.o. year old male who is a primary care patient of Burchette, Alinda Sierras, MD. I reached out to [facility] by phone today offer care coordination services.  ? ?Mr. Bonenfant was given information about care management services today including:  ?Care management services include personalized support from designated clinical staff supervised by his physician, including individualized plan of care and coordination with other care providers ?24/7 contact phone numbers for assistance for urgent and routine care needs. ?The patient may stop care management services at any time by phone call to the office staff. ? ?Patient agreed to services and verbal consent obtained.  ? ?Follow up plan: ?Telephone appointment with care management team member scheduled for:02/12/22 ? ?Laverda Sorenson  ?Care Guide, Embedded Care Coordination ?[facility]  Care Management  ?Direct Dial: [phone] ? ?

## 2022-02-12 ENCOUNTER — Ambulatory Visit: Payer: Medicare PPO

## 2022-02-12 DIAGNOSIS — I5023 Acute on chronic systolic (congestive) heart failure: Secondary | ICD-10-CM

## 2022-02-12 NOTE — Chronic Care Management (AMB) (Signed)
? Care Management ?  ? RN Visit Note ? ?02/12/2022 ?Name: [name] MRN: [id number] DOB: 1933-03-09 ? ?Subjective: ?[name] is a 88 y.o. year old male who is a primary care patient of Burchette, Alinda Sierras, MD. The care management team was consulted for assistance with disease management and care coordination needs.   ? ?Engaged with patient by telephone for initial visit in response to provider referral for case management and/or care coordination services.  ? ?Consent to Services:  ? [name] was given information about Care Management services today including:  ?Care Management services includes personalized support from designated clinical staff supervised by his physician, including individualized plan of care and coordination with other care providers ?24/7 contact phone numbers for assistance for urgent and routine care needs. ?The patient may stop case management services at any time by phone call to the office staff. ? ?Patient agreed to services and consent obtained.  ? ?Assessment: Review of patient past medical history, allergies, medications, health status, including review of consultants reports, laboratory and other test data, was performed as part of comprehensive evaluation and provision of chronic care management services.  ? ?SDOH (Social Determinants of Health) assessments and interventions performed:  ?SDOH Interventions   ? ?Flowsheet Row Most Recent Value  ?SDOH Interventions   ?Food Insecurity Interventions Intervention Not Indicated  ?Transportation Interventions Intervention Not Indicated  ? ?  ?  ? ?Care Plan ? ?Allergies  ?Allergen Reactions  ? Codeine Other (See Comments)  ?  Makes him feel goofy  ? Morphine Other (See Comments)  ?  Nightmares and felt bad  ? ? ?Outpatient Encounter Medications as of 02/12/2022  ?Medication Sig Note  ? albuterol (VENTOLIN HFA) 108 (90 Base) MCG/ACT inhaler Inhale 2 puffs into the lungs every 6 (six) hours as needed for wheezing or shortness of breath.    ? amLODipine (NORVASC) 5 MG tablet TAKE 1 TABLET BY MOUTH EVERY DAY (Patient taking differently: Take 5 mg by mouth daily.)   ? budesonide-formoterol (SYMBICORT) 160-4.5 MCG/ACT inhaler Inhale 2 puffs into the lungs in the morning and at bedtime. (Patient taking differently: Inhale 2 puffs into the lungs 2 (two) times daily as needed (sob/wheezing).)   ? Carboxymethylcellul-Glycerin (LUBRICATING EYE DROPS OP) Place 1 drop into both eyes daily as needed (dry eyes).   ? carvedilol (COREG) 6.25 MG tablet TAKE 1 TABLET (6.25 MG TOTAL) BY MOUTH 2 (TWO) TIMES DAILY WITH A MEAL.   ? clopidogrel (PLAVIX) 75 MG tablet Take 1 tablet (75 mg total) by mouth daily with breakfast.   ? Cyanocobalamin (VITAMIN B-12 PO) Take 1 tablet by mouth daily.   ? docusate sodium (STOOL SOFTENER) 100 MG capsule Take 1 capsule (100 mg total) by mouth 2 (two) times daily as needed for mild constipation.   ? ELIQUIS 2.5 MG TABS tablet TAKE 1 TABLET BY MOUTH TWICE A DAY (Patient taking differently: Take 2.5 mg by mouth 2 (two) times daily.)   ? Homeopathic Products [facility] RELIEF EX) Apply 1 application topically daily as needed (knee pain).   ? hydrocortisone 2.5 % cream Apply 1 application topically daily as needed (facial breakouts).   ? Insulin Degludec (TRESIBA) 100 UNIT/ML SOLN Inject 10 Units into the skin daily.   ? JANUVIA 50 MG tablet TAKE 1 TABLET BY MOUTH EVERY DAY (Patient taking differently: Take 50 mg by mouth daily.)   ? Multiple Vitamins-Minerals (ICAPS AREDS 2 PO) Take 1 capsule by mouth daily.   ? nitroGLYCERIN (NITROSTAT)  0.4 MG SL tablet PLACE 1 TABLET UNDER THE TONGUE EVERY 5 MINUTES X 3 DOSES AS NEEDED FOR CHEST PAIN 02/09/2022: PRN only  ?  ? pantoprazole (PROTONIX) 40 MG tablet TAKE 1 TABLET BY MOUTH EVERY DAY (Patient taking differently: Take 40 mg by mouth daily.)   ? potassium chloride (KLOR-CON 10) 10 MEQ tablet Take 1 tablet (10 mEq total) by mouth daily.   ? rosuvastatin (CRESTOR) 20 MG tablet TAKE 1 TABLET BY  MOUTH EVERY DAY (Patient taking differently: Take 20 mg by mouth daily.)   ? sertraline (ZOLOFT) 50 MG tablet TAKE 1 TABLET BY MOUTH EVERY DAY (Patient taking differently: Take 50 mg by mouth daily.)   ? spironolactone (ALDACTONE) 25 MG tablet TAKE 1 TABLET (25 MG TOTAL) BY MOUTH DAILY. (Patient taking differently: Take 25 mg by mouth daily.)   ? torsemide (DEMADEX) 20 MG tablet Take 4 tablets (80 mg total) by mouth daily.   ? BD PEN NEEDLE NANO 2ND GEN 32G X 4 MM MISC USE AS DIRECTED.   ? blood glucose meter kit and supplies KIT Dispense based on patient and insurance preference. Use up to four times daily as directed. (FOR ICD-9 250.00, 250.01).   ? ?No facility-administered encounter medications on file as of 02/12/2022.  ? ? ?Patient Active Problem List  ? Diagnosis Date Noted  ? Stage 3b chronic kidney disease (CKD) ([place]) 02/06/2022  ? Type 2 diabetes mellitus with hyperlipidemia ([place])   ? Anemia   ? Atrial fibrillation, chronic ([place])   ? Acute on chronic systolic CHF (congestive heart failure) ([place]) 02/05/2022  ? Depression, recurrent ([place]) 09/30/2021  ? NSTEMI (non-ST elevated myocardial infarction) ([place])   ? S/P TAVR (transcatheter aortic valve replacement) 08/18/2021  ? Unstable angina ([place]) 08/18/2021  ? Severe aortic stenosis 04/29/2021  ? Aortic stenosis, severe 04/27/2021  ? Angina at rest [facility]) 09/20/2020  ? Chronic diastolic (congestive) heart failure ([place]) 09/20/2020  ? COPD with acute bronchitis ([place]) 09/08/2020  ? Acute exacerbation of CHF (congestive heart failure) ([place]) 09/07/2020  ? COPD mixed type ([place]) 04/03/2020  ? Chronic systolic HF (heart failure) ([place]) 12/26/2019  ? Systolic dysfunction with acute on chronic heart failure ([place]) 10/23/2019  ? Educated about COVID-19 virus infection 10/23/2019  ? Persistent atrial fibrillation ([place]) 08/29/2019  ? Nonrheumatic aortic valve stenosis 08/29/2019  ? Elevated troponin 07/27/2019  ? HLD (hyperlipidemia) 07/27/2019  ? GERD (gastroesophageal reflux  disease) 07/27/2019  ? Congestive heart failure (CHF) ([place]) 07/26/2019  ? Pure hypercholesterolemia   ? Acute on chronic diastolic CHF (congestive heart failure) ([place])   ? SOB (shortness of breath) 06/04/2019  ? CKD (chronic kidney disease), stage III ([place]) 06/04/2019  ? Coronary artery disease of native artery of native heart with stable angina pectoris ([place]) 03/24/2018  ? Bilateral carotid artery stenosis 07/23/2017  ? Palpitation 07/23/2017  ? Urinary urgency 07/28/2016  ? PAF (paroxysmal atrial fibrillation) ([place])   ? DOE (dyspnea on exertion) 07/17/2014  ? Acute on chronic renal insufficiency- ACE and diuretics held 07/17/2014  ? Diastolic dysfunction- moderate on echo 2013 07/17/2014  ? Aortic stenosis 07/17/2014  ? LBBB (left bundle branch block) 07/17/2014  ? Chest pain 07/17/2014  ? Crescendo angina ([place]) 07/16/2014  ? CKD (chronic kidney disease) stage 4, GFR 15-29 ml/min (HCC) 12/03/2013  ? Squamous cell cancer of skin of hand 05/31/2013  ? HEAD TRAUMA, CLOSED 09/24/2010  ? Ischemic cardiomyopathy- last EF Normal Myoview Jan 2015 04/02/2010  ? INSOMNIA 04/02/2010  ? Moderate  bilateral ICA disease by doppler June 2015 01/08/2010  ? DYSPHAGIA UNSPECIFIED 09/05/2009  ? Hx GERD/PUD 09/03/2009  ? CHOLELITHIASIS, WITH CHOLECYSTITIS 09/03/2009  ? ARTHRITIS 07/16/2009  ? HYPOKALEMIA 11/06/2008  ? CAD in native artery 11/06/2008  ? 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 ([place]) 11/23/2007  ? Dyslipidemia 11/23/2007  ? ROSACEA 11/23/2007  ? DYSPNEA ON EXERTION 11/23/2007  ? UNS ADVRS EFF OTH RX MEDICINAL&BIOLOGICAL SBSTNC 11/23/2007  ? Hypertension 11/22/2007  ? Hx of Prostate ca 03/01/2004  ? ? ?Conditions to be addressed/monitored: CHF and Fall Prevention ? ?Care Plan : RN Care Manager Plan of Care  ?Updates made by Luretha Rued, RN since 02/12/2022 12:00 AM  ?  ? ?Problem: Chronic disease management education and/or care coordination needs   ?Priority: High  ?  ? ?Long-Range Goal:  Development of Plan of Care for Chronic Disease Management and/or Care Coordination Needs   ?Start Date: 02/12/2022  ?Expected End Date: 05/14/2022  ?Priority: High  ?Note:   ?Current Barriers: 88 yr old admitted

## 2022-02-12 NOTE — Patient Instructions (Signed)
Visit Information ? ?Thank you for taking time to visit with me today. Please don't hesitate to contact me if I can be of assistance to you before our next scheduled telephone appointment. ? ?Following are the goals we discussed today:  ?Patient Goals/Self-Care Activities: ?Take medications as prescribed   ?Attend all scheduled provider appointments ?Call pharmacy for medication refills 3-7 days in advance of running out of medications ?call office if I gain more than 2 pounds in one day or 5 pounds in one week ?use salt in moderation ?watch for swelling in feet, ankles and legs every day ?weigh myself daily ?follow rescue plan if symptoms flare-up ?Review provided information: CHF and Fall prevention strategies ? ?Our next appointment is by telephone on 02/22/22 at 10:00 am ? ?Please call the care guide team at [phone] if you need to cancel or reschedule your appointment.  ? ?If you are experiencing a Mental Health or [place] or need someone to talk to, please call the Suicide and Crisis Lifeline: 988 ?call 1-800-273-TALK (toll free, 24 hour hotline)  ? ?Following is a copy of your full plan of care:  ?Care Plan : [place] of Care  ?Updates made by Luretha Rued, RN since 02/12/2022 12:00 AM  ?  ? ?Problem: Chronic disease management education and/or care coordination needs   ?Priority: High  ?  ? ?Long-Range Goal: Development of Plan of Care for Chronic Disease Management and/or Care Coordination Needs   ?Start Date: 02/12/2022  ?Expected End Date: 05/14/2022  ?Priority: High  ?Note:   ?Current Barriers: 88 yr old admitted to hospital 02/06/22-02/07/22. RNCM spoke with patient through [place], wife due to patient [facility]. Mr. Carew reports he is feeling better since hospitalization. [facility] health has been in contact with patient and will start care next week. Mr. Borjon has had follow up with PCP on 02/10/22. Upcoming appointment with cardiologist 02/15/22. Mr. Halladay is weighing and  recording weights daily. Weight today 149 pounds. Discharge weight 154 pounds. Reports some dizziness with increase in Torsemide. Mrs. Winward states Mr. Haste is adjusting to increased dose of Torsemide. She states, if does not improve or gets worse will contact provider. Had not been checking blood pressure-Blood pressure checked during telephone assessment-121/61 HR 75. Mr. Placencia reports he was leaning over to pick up something from floor and leaned over to far for a fall. Denies any injury.   ?Knowledge Deficits related to plan of care for management of CHF  ?Chronic Disease Management support and education needs related to CHF ? ?RNCM Clinical Goal(s):  ?Patient will verbalize understanding of plan for management of CHF as evidenced by self report of attending provider visits, taking medications as prescribed, calling provider as needed, notation in chart ?take all medications exactly as prescribed and will call provider for medication related questions as evidenced by self report, chart notation    through collaboration with RN Care manager, provider, and care team.  ? ?Interventions: ?1:1 collaboration with primary care provider regarding development and update of comprehensive plan of care as evidenced by provider attestation and co-signature ?Inter-disciplinary care team collaboration (see longitudinal plan of care) ?Evaluation of current treatment plan related to  self management and patient's adherence to plan as established by provider ? ?Systolic CHF  (Status:  New goal.)  Long Term Goal ?Evaluation of current treatment plan related to CHF, self-management and patient's adherence to plan as established by provider. ?Discussed plans with patient for ongoing care management follow  up and provided patient with direct contact information for care management team ?Advised patient to per provider recommendation: contact provider for weight gain 2 pounds in a day or 5 pounds in a week, increased SOB, swelling hands,  feet, stomach ?Reviewed medications with patient and discussed importance of taking as prescribed. Encouraged to contact provider with any questions or concerns ?Provided patient with   educational materials related to CHF ?Discussed plans with patient for ongoing care management follow up and provided patient with direct contact information for care management team ? ?Falls Interventions:  (Status:  New goal.) Long Term Goal ?Provided written and verbal education re: potential causes of falls and Fall prevention strategies ?Reviewed medications and discussed potential side effects of medications such as dizziness and frequent urination ?Advised patient of importance of notifying provider of falls  ? ?Patient Goals/Self-Care Activities: ?Take medications as prescribed   ?Attend all scheduled provider appointments ?Call pharmacy for medication refills 3-7 days in advance of running out of medications ?call office if I gain more than 2 pounds in one day or 5 pounds in one week ?use salt in moderation ?watch for swelling in feet, ankles and legs every day ?weigh myself daily ?follow rescue plan if symptoms flare-up ?Review provided information: CHF and Fall prevention strategies ?  ? ?  ? ? ?Mr. Teasdale was given information about Care Management services by the embedded care coordination team including:  ?Care Management services include personalized support from designated clinical staff supervised by his physician, including individualized plan of care and coordination with other care providers ?24/7 contact phone numbers for assistance for urgent and routine care needs. ?The patient may stop CCM services at any time (effective at the end of the month) by phone call to the office staff. ? ?Patient agreed to services and verbal consent obtained.  ? ?The patient verbalized understanding of instructions, educational materials, and care plan provided today and agreed to receive a mailed copy of patient instructions,  educational materials, and care plan.  ? ?Telephone follow up appointment with care management team member scheduled for: 02/22/22 ?The patient has been provided with contact information for the care management team and has been advised to call with any health related questions or concerns.  ? ?Thea Silversmith, RN, MSN, BSN, CCM ?Care Management Coordinator ?[phone]  ? ?Understanding Your Risk for Falls ?Each year, millions of people have serious injuries from falls. It is important to understand your risk for falling. Talk with your health care provider about your risk and what you can do to lower it. There are actions you can take at home to lower your risk and prevent falls. ?If you do have a serious fall, make sure to tell your health care provider. Falling once raises your risk of falling again. ?How can falls affect me? ?Serious injuries from falls are common. These include: ?Broken bones, such as hip fractures. ?Head injuries, such as traumatic brain injuries (TBI) or concussion. ?A fear of falling can cause you to avoid activities and stay at home. This can make your muscles weaker and actually raise your risk for a fall. ?What can increase my risk? ?There are a number of risk factors that increase your risk for falling. The more risk factors you have, the higher your risk of falling. Serious injuries from a fall happen most often to people older than age 44. Children and young adults ages 21-29 are also at higher risk. ?Common risk factors include: ?Weakness in the lower body. ?Lack (deficiency)  of vitamin D. ?Being generally weak or confused due to long-term (chronic) illness. ?Dizziness or balance problems. ?Poor vision. ?Medicines that cause dizziness or drowsiness. These can include medicines for your blood pressure, heart, anxiety, insomnia, or edema, as well as pain medicines and muscle relaxants. ?Other risk factors include: ?Drinking alcohol. ?Having had a fall in the past. ?Having  depression. ?Having foot pain or wearing improper footwear. ?Working at a dangerous job. ?Having any of the following in your home: ?Tripping hazards, such as floor clutter or loose rugs. ?Poor lighting. ?Pets. ?Having dementia or

## 2022-02-14 NOTE — Progress Notes (Signed)
?  ?Cardiology Office Note ? ? ?Date:  02/15/2022  ? ?ID:  [name], DOB [birth year], MRN [id number] ? ?PCP:  [name], MD  ?Cardiologist:   [name], MD ? ?Chief Complaint  ?Patient presents with  ? Coronary Artery Disease  ? ? ? ?  ?History of Present Illness: ?[name] is a 88 y.o. male who presents for followup of CAD.   He had chest pain and had a cardiac catheterization on 04/01/2015 which showed a 95% in-stent restenosis in SVG to OM, treated with 2.75 x 24 mm Synergy DES postdilated to 3.3 mm. He had patent LIMA to LAD, patent SVG to posterolateral, patent SVG to PDA.    Of note he did have atrial fibrillation with rate control on presentation but he converted spontaneously. He was not started on warfarin because he was taking aspirin and Plavix.   He was having SOB in Dec 2018.  On [place] he had a fixed inferior defect.  CPX testing suggested a mixed restrictive/obstructive pattern of PFTs.  He was not thought to have a cardiovascular limitation.   He has been in atrial fib.  I had him wear a Holter and he had 100% atrial fib on this monitor.  He had a cardioversion but had recurrent atrial fib.  He had continued dyspnea.  I sent him to pulmonary and he was found to have moderate COPD.   The Structural Heart Team was consulted and recommended work-up for possible TAVR. Patient underwent right/left heart cath on 09/23/2020 which showed severe native vessel CAD with patent LIMA to LAD, SVG to left PLA, and SVG to right PDA grafts. He had severe in-stent restenosis of SVG to OM graft which was treated with noncompliant balloon angioplasty. Moderate AS was noted with mean gradient of 14 mmHg but a component of paradoxical low flow low gradient AS was suspected. Triple therapy with Aspirin, Plavix, and Eliquis was recommended for 1 month at which time Aspirin can be discontinued. Plavix recommended for 6 months. Lasix was reduced to 29m once daily and Spironolactone was continued as  well as Coreg and Amlodipine.  He has continued to have chronic chest pain.  He had a TAVR.   Unfortunately, he has continued to have chest pain.  He was most recently in the hospital with chest pain earlier this month.   ? ?Cardiac catheterization showed patent 3 grafts. There was severe restenosis in the distal body of SVG to OM1 in the previously stented segment s/p successful PTCA with balloon angioplasty only of the restenosis within the distal body of the vein graft stented segment.  The plan to treat with Plavix and Eliquis for 6 months.  Discontinued aspirin.  He was sent home on double the dose of Torsemide.  Echo demonstrated stable perivalvular leak around the aortic valve.   ? ?He says he has been dizzy since he got home.  He thinks he is getting too much torsemide and they are taking it all at once.  He actually did have a fall when he was leaning over to pick up an electrical cord but this was a mechanical fall.  He has not had any loss of consciousness.  He denies any chest pressure, neck or arm discomfort.  He has not had any new palpitations, presyncope or syncope.  He has had no weight gain and in fact he is down to his lowest that I have seen.  He has only trace ankle edema.  He  is not having any new PND or orthopnea.  Overall he is breathing better and not having any new chest discomfort. ? ? ?Past Medical History:  ?Diagnosis Date  ? Aortic stenosis, moderate 09/20/2020  ? Arthritis   ? CAD (coronary artery disease)   ? a. Cath September 2015 LIMA to the LAD patent, SVG to PDA patent, SVG to posterior lateral patent, SVG to OM with a 90% in-stent restenosis at an anastomotic lesion. This was treated with angioplasty. b. cath 04/01/2015 95% ISR in SVG to OM treated with 2.75x24 Synergy DES postdilated to 3.60m, all other grafts patent  ? Cataract   ? surgery,B/L  ? CHF (congestive heart failure) [place]   ? Chronic kidney disease   ? nephrolithiasis  ? Diabetes mellitus   ? TYPE 2  ? GERD  (gastroesophageal reflux disease)   ? Hernia   ? Hypercholesterolemia   ? Hypertension   ? Incontinence   ? hx  over 1 year,leaks without awareness  ? Other and unspecified diseases of appendix   ? PAF (paroxysmal atrial fibrillation) ([place]   ? in the setting of ischemia on 03/31/2015  ? Pancreatitis   ? Prostate CA ([place] 03/01/04  ? prostate bx=Adenocarcinoam,gleason 3+4=7,PSA=6.75  ? Shortness of breath dyspnea   ? Ulcer   ? hx gastric  ? ? ?Past Surgical History:  ?Procedure Laterality Date  ? APPENDECTOMY    ? BACK SURGERY    ? CARDIAC CATHETERIZATION N/A 04/01/2015  ? Procedure: Left Heart Cath and Cors/Grafts Angiography;  Surgeon: JJettie Booze MD;  [facility];  Service: Cardiovascular;  Laterality: N/A;  ? CARDIAC CATHETERIZATION N/A 04/01/2015  ? Procedure: Coronary Stent Intervention;  Surgeon: JJettie Booze MD;  [facility];  Service: Cardiovascular;  Laterality: N/A;  ? CARDIOVERSION N/A 11/01/2019  ? Procedure: CARDIOVERSION;  Surgeon: OGeralynn Rile MD;  [facility]  Service: Cardiovascular;  Laterality: N/A;  ? [place]  ? both hands  ? CORONARY ARTERY BYPASS GRAFT    ? CORONARY BALLOON ANGIOPLASTY N/A 08/19/2021  ? Procedure: CORONARY BALLOON ANGIOPLASTY;  Surgeon: MBurnell Blanks MD;  [facility];  Service: Cardiovascular;  Laterality: N/A;  ? CORONARY STENT INTERVENTION N/A 09/23/2020  ? Procedure: CORONARY STENT INTERVENTION;  Surgeon: CSherren Mocha MD;  [facility];  Service: Cardiovascular;  Laterality: N/A;  ? CYSTOSCOPY  08/23/12  ? incomplete emoptying bladder  ? HERNIA REPAIR    ? incision and drainage of right chest abscess    ? INTRAOPERATIVE TRANSTHORACIC ECHOCARDIOGRAM Left 04/28/2021  ? Procedure: INTRAOPERATIVE TRANSTHORACIC ECHOCARDIOGRAM;  Surgeon: MBurnell Blanks MD;  [facility]  Service: Open Heart Surgery;  Laterality: Left;  ? LAPAROSCOPIC  CHOLECYSTECTOMY  09/2009  ? LEFT HEART CATH AND CORS/GRAFTS ANGIOGRAPHY N/A 08/19/2021  ? Procedure: LEFT HEART CATH AND CORS/GRAFTS ANGIOGRAPHY;  Surgeon: MBurnell Blanks MD;  [facility];  Service: Cardiovascular;  Laterality: N/A;  ? LEFT HEART CATHETERIZATION WITH CORONARY ANGIOGRAM N/A 07/19/2014  ? Procedure: LEFT HEART CATHETERIZATION WITH CORONARY ANGIOGRAM;  Surgeon: DLeonie Man MD;  [facility];  Service: Cardiovascular;  Laterality: N/A;  ? RECTAL SURGERY    ? RIGHT/LEFT HEART CATH AND CORONARY/GRAFT ANGIOGRAPHY N/A 09/23/2020  ? Procedure: RIGHT/LEFT HEART CATH AND CORONARY/GRAFT ANGIOGRAPHY;  Surgeon: CSherren Mocha MD;  [facility];  Service: Cardiovascular;  Laterality: N/A;  ? ROBOT ASSISTED LAPAROSCOPIC  RADICAL PROSTATECTOMY  2005  ? TRANSCATHETER AORTIC VALVE REPLACEMENT, TRANSFEMORAL Bilateral 04/28/2021  ? Procedure: TRANSCATHETER AORTIC VALVE REPLACEMENT, TRANSFEMORAL;  Surgeon: Burnell Blanks, MD;  Location: [place];  Service: Open Heart Surgery;  Laterality: Bilateral;  ? ULTRASOUND GUIDANCE FOR VASCULAR ACCESS Bilateral 04/28/2021  ? Procedure: ULTRASOUND GUIDANCE FOR VASCULAR ACCESS;  Surgeon: Burnell Blanks, MD;  Location: [place] [facility];  Service: Open Heart Surgery;  Laterality: Bilateral;  ? ? ? ?Current Outpatient Medications  ?Medication Sig Dispense Refill  ? albuterol (VENTOLIN HFA) 108 (90 Base) MCG/ACT inhaler Inhale 2 puffs into the lungs every 6 (six) hours as needed for wheezing or shortness of breath.    ? amLODipine (NORVASC) 5 MG tablet TAKE 1 TABLET BY MOUTH EVERY DAY (Patient taking differently: Take 5 mg by mouth daily.) 90 tablet 3  ? BD PEN NEEDLE NANO 2ND GEN 32G X 4 MM MISC USE AS DIRECTED. 30 each 1  ? blood glucose meter kit and supplies KIT Dispense based on patient and insurance preference. Use up to four times daily as directed. (FOR ICD-9 250.00, 250.01). 1 each 0  ? budesonide-formoterol (SYMBICORT)  160-4.5 MCG/ACT inhaler Inhale 2 puffs into the lungs in the morning and at bedtime. (Patient taking differently: Inhale 2 puffs into the lungs 2 (two) times daily as needed (sob/wheezing).) 1 each 12  ? Carboxyme

## 2022-02-15 ENCOUNTER — Ambulatory Visit: Payer: Medicare PPO | Admitting: Cardiology

## 2022-02-15 ENCOUNTER — Encounter: Payer: Self-pay | Admitting: Cardiology

## 2022-02-15 VITALS — BP 120/60 | HR 73 | Ht 69.0 in | Wt 151.6 lb

## 2022-02-15 DIAGNOSIS — I5043 Acute on chronic combined systolic (congestive) and diastolic (congestive) heart failure: Secondary | ICD-10-CM

## 2022-02-15 DIAGNOSIS — I4819 Other persistent atrial fibrillation: Secondary | ICD-10-CM | POA: Diagnosis not present

## 2022-02-15 DIAGNOSIS — E785 Hyperlipidemia, unspecified: Secondary | ICD-10-CM | POA: Diagnosis not present

## 2022-02-15 DIAGNOSIS — I10 Essential (primary) hypertension: Secondary | ICD-10-CM

## 2022-02-15 DIAGNOSIS — I2510 Atherosclerotic heart disease of native coronary artery without angina pectoris: Secondary | ICD-10-CM | POA: Diagnosis not present

## 2022-02-15 DIAGNOSIS — N184 Chronic kidney disease, stage 4 (severe): Secondary | ICD-10-CM

## 2022-02-15 LAB — BASIC METABOLIC PANEL
BUN/Creatinine Ratio: 17 (ref 10–24)
BUN: 29 mg/dL — ABNORMAL HIGH (ref 8–27)
CO2: 25 mmol/L (ref 20–29)
Calcium: 10 mg/dL (ref 8.6–10.2)
Chloride: 98 mmol/L (ref 96–106)
Creatinine, Ser: 1.68 mg/dL — ABNORMAL HIGH (ref 0.76–1.27)
Glucose: 155 mg/dL — ABNORMAL HIGH (ref 70–99)
Potassium: 4.1 mmol/L (ref 3.5–5.2)
Sodium: 136 mmol/L (ref 134–144)
eGFR: 39 mL/min/{1.73_m2} — ABNORMAL LOW (ref >59)

## 2022-02-15 MED ORDER — TORSEMIDE 20 MG PO TABS: ORAL_TABLET | ORAL | 0 refills | Status: DC

## 2022-02-15 NOTE — Patient Instructions (Signed)
Medication Instructions:  ? ?TAKE TORSEMIDE 40 MG IN THE MORNING AND 20 MG 6 HOURS LATER= 2 TABLETS IN THE MORNING AND 1 TABLET 6 HOURS LATER ? ?*If you need a refill on your cardiac medications before your next appointment, please call your pharmacy* ? ? ?Follow-Up: ?At [facility], you and your health needs are our priority.  As part of our continuing mission to provide you with exceptional heart care, we have created designated Provider Care Teams.  These Care Teams include your primary Cardiologist (physician) and Advanced Practice Providers (APPs -  Physician Assistants and Nurse Practitioners) who all work together to provide you with the care you need, when you need it. ? ?We recommend signing up for the patient portal called "MyChart".  Sign up information is provided on this After Visit Summary.  MyChart is used to connect with patients for Virtual Visits (Telemedicine).  Patients are able to view lab/test results, encounter notes, upcoming appointments, etc.  Non-urgent messages can be sent to your provider as well.   ?To learn more about what you can do with MyChart, go to [email or web address].   ? ?Your next appointment:   ?1 month(s) ? ?The format for your next appointment:   ?In Person ? ?Provider:   ?Diona Browner NP ? ? ? ?Important Information About Sugar ? ? ? ? ?  ?

## 2022-02-16 ENCOUNTER — Encounter: Payer: Self-pay | Admitting: *Deleted

## 2022-02-22 ENCOUNTER — Ambulatory Visit: Payer: Medicare PPO

## 2022-02-22 NOTE — Patient Instructions (Addendum)
Visit Information ? ?Thank you for taking time to visit with me today. Please don't hesitate to contact me if I can be of assistance to you before our next scheduled telephone appointment. ? ?Following are the goals we discussed today:  ?Patient Goals/Self-Care Activities: ?Take medications as prescribed   ?Attend all scheduled provider appointments ?Call pharmacy for medication refills 3-7 days in advance of running out of medications ?use salt in moderation ?watch for swelling in feet, ankles and legs every day ?weigh myself daily ?follow rescue plan if symptoms flare-up ?Review provided information: Heart failure ? ?Your next appointment with Peter Garter, Care Management Coordinator is by telephone on 03/29/22 at 2:00 pm ? ?Please call the care guide team at [phone] if you need to cancel or reschedule your appointment.  ? ?If you are experiencing a Mental Health or [place] or need someone to talk to, please call the Suicide and Crisis Lifeline: 988 ?call 1-800-273-TALK (toll free, 24 hour hotline)  ? ?Patient verbalizes understanding of instructions and care plan provided today and agrees to view in [place]. Active MyChart status confirmed with patient.   ? ?Telephone follow up appointment with care management team member scheduled for: ?The patient has been provided with contact information for the care management team and has been advised to call with any health related questions or concerns.  ? ?Thea Silversmith, RN, MSN, BSN, CCM ?Care Management Coordinator ?[phone]  ? ? ?Heart Failure Eating Plan ?Heart failure, also called congestive heart failure, occurs when your heart does not pump blood well enough to meet your body's needs for oxygen-rich blood. Heart failure is a long-term (chronic) condition. Living with heart failure can be challenging. Following your health care provider's instructions about a healthy lifestyle and working with a dietitian to choose the right foods may  help to improve your symptoms. An eating plan for someone with heart failure will include changes that limit the intake of salt (sodium) and unhealthy fat. ?What are tips for following this plan? ?Reading food labels ?Check food labels for the amount of sodium per serving. Choose foods that have less than 140 mg (milligrams) of sodium in each serving. ?Check food labels for the number of calories per serving. This is important if you need to limit your daily calorie intake to lose weight. ?Check food labels for the serving size. If you eat more than one serving, you will be eating more sodium and calories than what is listed on the label. ?Look for foods that are labeled as "sodium-free," "very low sodium," or "low sodium." ?Foods labeled as "reduced sodium" or "lightly salted" may still have more sodium than what is recommended for you. ?Cooking ?Avoid adding salt when cooking. Ask your health care provider or dietitian before using salt substitutes. ?Season food with salt-free seasonings, spices, or herbs. Check the label of seasoning mixes to make sure they do not contain salt. ?Cook with heart-healthy oils, such as olive, canola, soybean, or sunflower oil. ?Do not fry foods. Cook foods using low-fat methods, such as baking, boiling, grilling, and broiling. ?Limit unhealthy fats when cooking by: ?Removing the skin from poultry, such as chicken. ?Removing all visible fats from meats. ?Skimming the fat off from stews, soups, and gravies before serving them. ?Meal planning ? ?Limit your intake of: ?Processed, canned, or prepackaged foods. ?Foods that are high in trans fat, such as fried foods. ?Sweets, desserts, sugary drinks, and other foods with added sugar. ?Full-fat dairy products, such as whole milk. ?Eat a  balanced diet. This may include: ?4-5 servings of fruit each day and 4-5 servings of vegetables each day. At each meal, try to fill one-half of your plate with fruits and vegetables. ?Up to 6-8 servings of  whole grains each day. ?Up to 2 servings of lean meat, poultry, or fish each day. One serving of meat is equal to 3 oz (85 g). This is about the same size as a deck of cards. ?2 servings of low-fat dairy each day. ?Heart-healthy fats. Healthy fats called omega-3 fatty acids are found in foods such as flaxseed and cold-water fish like sardines, salmon, and mackerel. ?Aim to eat 25-35 g (grams) of fiber a day. Foods that are high in fiber include apples, broccoli, carrots, beans, peas, and whole grains. ?Do not add salt or condiments that contain salt (such as soy sauce) to foods before eating. ?When eating at a restaurant, ask that your food be prepared with less salt or no salt, if possible. ?Try to eat 2 or more vegetarian meals each week. ?Eat more home-cooked food and eat less restaurant, buffet, and fast food. ?General information ?Do not eat more than 2,300 mg of sodium a day. The amount of sodium that is recommended for you may be lower, depending on your condition. ?Maintain a healthy body weight as directed. Ask your health care provider what a healthy weight is for you. ?Check your weight every day. ?Work with your health care provider and dietitian to make a plan that is right for you to lose weight or maintain your current weight. ?Limit how much fluid you drink. Ask your health care provider or dietitian how much fluid you can have each day. ?Limit or avoid alcohol as told by your health care provider or dietitian. ?Recommended foods ?Fruits ?All fresh, frozen, and canned fruits. Dried fruits, such as raisins, prunes, and cranberries. ?Vegetables ?All fresh vegetables. Vegetables that are frozen without sauce or added salt. Low-sodium or sodium-free canned vegetables. ?Grains ?Bread with less than 80 mg of sodium per slice. Whole-wheat pasta, quinoa, and brown rice. Oats and oatmeal. Barley. [place]. Grits and cream of wheat. Whole-grain and whole-wheat cold cereal. ?Meats and other protein foods ?Lean  cuts of meat. Skinless chicken and [place]. Fish with high omega-3 fatty acids, such as salmon, sardines, and other cold-water fishes. Eggs. Dried beans, peas, and edamame. Unsalted nuts and nut butters. ?Dairy ?Low-fat or nonfat (skim) milk and dried milk. Rice milk, soy milk, and almond milk. Low-fat or nonfat yogurt. Small amounts of reduced-sodium block cheese. Low-sodium cottage cheese. ?Fats and oils ?Olive, canola, soybean, flaxseed, avocado, or sunflower oil. ?Sweets and desserts ?Applesauce. Granola bars. Sugar-free pudding and gelatin. Frozen fruit bars. ?Seasoning and other foods ?Fresh and dried herbs. Lemon or lime juice. Vinegar. Low-sodium ketchup. Salt-free marinades, salad dressings, sauces, and seasonings. ?The items listed above may not be a complete list of foods and beverages you can eat. Contact a dietitian for more information. ?Foods to avoid ?Fruits ?Fruits that are dried with sodium-containing preservatives. ?Vegetables ?Canned vegetables. Frozen vegetables with sauce or seasonings. Creamed vegetables. [place] fries. Onion rings. Pickled vegetables and sauerkraut. ?Grains ?Bread with more than 80 mg of sodium per slice. Hot or cold cereal with more than 140 mg sodium per serving. Salted pretzels and crackers. Prepackaged breadcrumbs. Bagels, croissants, and biscuits. ?Meats and other protein foods ?Ribs and chicken wings. Bacon, ham, pepperoni, bologna, salami, and packaged luncheon meats. Hot dogs, bratwurst, and sausage. Canned meat. Smoked meat and fish. Salted nuts and  seeds. ?Dairy ?Whole milk, half-and-half, and cream. Buttermilk. Processed cheese, cheese spreads, and cheese curds. Regular cottage cheese. Feta cheese. Shredded cheese. String cheese. ?Fats and oils ?Butter, lard, shortening, ghee, and bacon fat. Canned and packaged gravies. ?Seasoning and other foods ?Onion salt, garlic salt, table salt, and sea salt. Marinades. Regular salad dressings. Relishes, pickles, and olives.  Meat flavorings and tenderizers, and bouillon cubes. Horseradish, ketchup, and mustard. Worcestershire sauce. Teriyaki sauce, soy sauce (including reduced sodium). Hot sauce and Tabasco sauce. Steak sauce, f

## 2022-02-22 NOTE — Chronic Care Management (AMB) (Signed)
? Care Management ?  ? RN Visit Note ? ?02/22/2022 ?Name: [name] MRN: [id number] DOB: 1933/01/19 ? ?Subjective: ?[name] is a 88 y.o. year old male who is a primary care patient of [name], Alinda Sierras, MD. The care management team was consulted for assistance with disease management and care coordination needs.   ? ?Engaged with patient by telephone for follow up visit in response to provider referral for case management and/or care coordination services.  ? ?Consent to Services:  ? [name] was given information about Care Management services today including:  ?Care Management services includes personalized support from designated clinical staff supervised by his physician, including individualized plan of care and coordination with other care providers ?24/7 contact phone numbers for assistance for urgent and routine care needs. ?The patient may stop case management services at any time by phone call to the office staff. ? ?Patient agreed to services and consent obtained.  ? ?Assessment: Review of patient past medical history, allergies, medications, health status, including review of consultants reports, laboratory and other test data, was performed as part of comprehensive evaluation and provision of chronic care management services.  ? ?SDOH (Social Determinants of Health) assessments and interventions performed:   ? ?Care Plan ? ?Allergies  ?Allergen Reactions  ? Codeine Other (See Comments)  ?  Makes him feel goofy  ? Morphine Other (See Comments)  ?  Nightmares and felt bad  ? ? ?Outpatient Encounter Medications as of 02/22/2022  ?Medication Sig Note  ? albuterol (VENTOLIN HFA) 108 (90 Base) MCG/ACT inhaler Inhale 2 puffs into the lungs every 6 (six) hours as needed for wheezing or shortness of breath.   ? amLODipine (NORVASC) 5 MG tablet TAKE 1 TABLET BY MOUTH EVERY DAY (Patient taking differently: Take 5 mg by mouth daily.)   ? budesonide-formoterol (SYMBICORT) 160-4.5 MCG/ACT inhaler Inhale 2 puffs  into the lungs in the morning and at bedtime. (Patient taking differently: Inhale 2 puffs into the lungs 2 (two) times daily as needed (sob/wheezing).)   ? Carboxymethylcellul-Glycerin (LUBRICATING EYE DROPS OP) Place 1 drop into both eyes daily as needed (dry eyes).   ? carvedilol (COREG) 6.25 MG tablet TAKE 1 TABLET (6.25 MG TOTAL) BY MOUTH 2 (TWO) TIMES DAILY WITH A MEAL.   ? clopidogrel (PLAVIX) 75 MG tablet Take 1 tablet (75 mg total) by mouth daily with breakfast.   ? Cyanocobalamin (VITAMIN B-12 PO) Take 1 tablet by mouth daily.   ? docusate sodium (STOOL SOFTENER) 100 MG capsule Take 1 capsule (100 mg total) by mouth 2 (two) times daily as needed for mild constipation.   ? ELIQUIS 2.5 MG TABS tablet TAKE 1 TABLET BY MOUTH TWICE A DAY (Patient taking differently: Take 2.5 mg by mouth 2 (two) times daily.)   ? Insulin Degludec (TRESIBA) 100 UNIT/ML SOLN Inject 10 Units into the skin daily.   ? JANUVIA 50 MG tablet TAKE 1 TABLET BY MOUTH EVERY DAY (Patient taking differently: Take 50 mg by mouth daily.)   ? Multiple Vitamins-Minerals (ICAPS AREDS 2 PO) Take 1 capsule by mouth daily.   ? nitroGLYCERIN (NITROSTAT) 0.4 MG SL tablet PLACE 1 TABLET UNDER THE TONGUE EVERY 5 MINUTES X 3 DOSES AS NEEDED FOR CHEST PAIN 02/09/2022: PRN only  ?  ? pantoprazole (PROTONIX) 40 MG tablet TAKE 1 TABLET BY MOUTH EVERY DAY (Patient taking differently: Take 40 mg by mouth daily.)   ? potassium chloride (KLOR-CON 10) 10 MEQ tablet Take 1 tablet (10 mEq  total) by mouth daily.   ? rosuvastatin (CRESTOR) 20 MG tablet TAKE 1 TABLET BY MOUTH EVERY DAY (Patient taking differently: Take 20 mg by mouth daily.)   ? sertraline (ZOLOFT) 50 MG tablet TAKE 1 TABLET BY MOUTH EVERY DAY (Patient taking differently: Take 50 mg by mouth daily.)   ? spironolactone (ALDACTONE) 25 MG tablet TAKE 1 TABLET (25 MG TOTAL) BY MOUTH DAILY. (Patient taking differently: Take 25 mg by mouth daily.)   ? torsemide (DEMADEX) 20 MG tablet Take 2 tablet in the  morning and 1 tablet in the evening   ? BD PEN NEEDLE NANO 2ND GEN 32G X 4 MM MISC USE AS DIRECTED.   ? blood glucose meter kit and supplies KIT Dispense based on patient and insurance preference. Use up to four times daily as directed. (FOR ICD-9 250.00, 250.01).   ? Homeopathic Products [facility] RELIEF EX) Apply 1 application topically daily as needed (knee pain).   ? hydrocortisone 2.5 % cream Apply 1 application topically daily as needed (facial breakouts).   ? ?No facility-administered encounter medications on file as of 02/22/2022.  ? ? ?Patient Active Problem List  ? Diagnosis Date Noted  ? Stage 3b chronic kidney disease (CKD) ([place]) 02/06/2022  ? Type 2 diabetes mellitus with hyperlipidemia ([place])   ? Anemia   ? Atrial fibrillation, chronic ([place])   ? Acute on chronic systolic CHF (congestive heart failure) ([place]) 02/05/2022  ? Depression, recurrent ([place]) 09/30/2021  ? NSTEMI (non-ST elevated myocardial infarction) ([place])   ? S/P TAVR (transcatheter aortic valve replacement) 08/18/2021  ? Unstable angina ([place]) 08/18/2021  ? Severe aortic stenosis 04/29/2021  ? Aortic stenosis, severe 04/27/2021  ? Angina at rest [facility]) 09/20/2020  ? Chronic diastolic (congestive) heart failure ([place]) 09/20/2020  ? COPD with acute bronchitis ([place]) 09/08/2020  ? Acute exacerbation of CHF (congestive heart failure) ([place]) 09/07/2020  ? COPD mixed type ([place]) 04/03/2020  ? Chronic systolic HF (heart failure) ([place]) 12/26/2019  ? Systolic dysfunction with acute on chronic heart failure ([place]) 10/23/2019  ? Educated about COVID-19 virus infection 10/23/2019  ? Persistent atrial fibrillation ([place]) 08/29/2019  ? Nonrheumatic aortic valve stenosis 08/29/2019  ? Elevated troponin 07/27/2019  ? HLD (hyperlipidemia) 07/27/2019  ? GERD (gastroesophageal reflux disease) 07/27/2019  ? Congestive heart failure (CHF) ([place]) 07/26/2019  ? Pure hypercholesterolemia   ? Acute on chronic diastolic CHF (congestive heart failure) ([place])   ? SOB (shortness of  breath) 06/04/2019  ? CKD (chronic kidney disease), stage III ([place]) 06/04/2019  ? Coronary artery disease of native artery of native heart with stable angina pectoris ([place]) 03/24/2018  ? Bilateral carotid artery stenosis 07/23/2017  ? Palpitation 07/23/2017  ? Urinary urgency 07/28/2016  ? PAF (paroxysmal atrial fibrillation) ([place])   ? DOE (dyspnea on exertion) 07/17/2014  ? Acute on chronic renal insufficiency- ACE and diuretics held 07/17/2014  ? Diastolic dysfunction- moderate on echo 2013 07/17/2014  ? Aortic stenosis 07/17/2014  ? LBBB (left bundle branch block) 07/17/2014  ? Chest pain 07/17/2014  ? Crescendo angina ([place]) 07/16/2014  ? CKD (chronic kidney disease) stage 4, GFR 15-29 ml/min (HCC) 12/03/2013  ? Squamous cell cancer of skin of hand 05/31/2013  ? HEAD TRAUMA, CLOSED 09/24/2010  ? Ischemic cardiomyopathy- last EF Normal Myoview Jan 2015 04/02/2010  ? INSOMNIA 04/02/2010  ? Moderate bilateral ICA disease by doppler June 2015 01/08/2010  ? DYSPHAGIA UNSPECIFIED 09/05/2009  ? Hx GERD/PUD 09/03/2009  ? CHOLELITHIASIS, WITH CHOLECYSTITIS 09/03/2009  ? ARTHRITIS 07/16/2009  ?  HYPOKALEMIA 11/06/2008  ? CAD in native artery 11/06/2008  ? 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 Dyslipidemia 11/23/2007  ? ROSACEA 11/23/2007  ? DYSPNEA ON EXERTION 11/23/2007  ? UNS ADVRS EFF OTH RX MEDICINAL&BIOLOGICAL SBSTNC 11/23/2007  ? Hypertension 11/22/2007  ? Hx of Prostate ca 03/01/2004  ? ? ?Conditions to be addressed/monitored: CHF and Fall Risk ? ?Care Plan : RN Care Manager Plan of Care  ?Updates made by Luretha Rued, RN since 02/22/2022 12:00 AM  ?  ? ?Problem: Chronic disease management education and/or care coordination needs   ?Priority: High  ?  ? ?Long-Range Goal: Development of Plan of Care for Chronic Disease Management and/or Care Coordination Needs   ?Start Date: 02/12/2022  ?Expected End Date: 05/14/2022  ?Priority: High  ?Note:   ?Current Barriers: 88 yr old  admitted to hospital 02/06/22-02/07/22 acute on chronic CHF.  ?02/12/22 88 yr old admitted to hospital 02/06/22-02/07/22. RNCM spoke with patient through [place], wife due to patient [facility]. Mr. Fury reports he is

## 2022-03-08 ENCOUNTER — Other Ambulatory Visit: Payer: Self-pay | Admitting: Family Medicine

## 2022-03-12 ENCOUNTER — Ambulatory Visit: Payer: Medicare PPO | Admitting: Family Medicine

## 2022-03-17 ENCOUNTER — Encounter: Payer: Self-pay | Admitting: Family Medicine

## 2022-03-17 ENCOUNTER — Ambulatory Visit (INDEPENDENT_AMBULATORY_CARE_PROVIDER_SITE_OTHER): Payer: Medicare PPO | Admitting: Family Medicine

## 2022-03-17 VITALS — BP 116/60 | HR 61 | Temp 97.5°F | Ht 69.0 in | Wt 150.1 lb

## 2022-03-17 DIAGNOSIS — I10 Essential (primary) hypertension: Secondary | ICD-10-CM | POA: Diagnosis not present

## 2022-03-17 DIAGNOSIS — I5030 Unspecified diastolic (congestive) heart failure: Secondary | ICD-10-CM | POA: Diagnosis not present

## 2022-03-17 DIAGNOSIS — D509 Iron deficiency anemia, unspecified: Secondary | ICD-10-CM | POA: Diagnosis not present

## 2022-03-17 LAB — CBC WITH DIFFERENTIAL/PLATELET
Basophils Absolute: 0 10*3/uL (ref 0.0–0.1)
Basophils Relative: 0.6 % (ref 0.0–3.0)
Eosinophils Absolute: 0.1 10*3/uL (ref 0.0–0.7)
Eosinophils Relative: 1.6 % (ref 0.0–5.0)
HCT: 38.9 % — ABNORMAL LOW (ref 39.0–52.0)
Hemoglobin: 12.7 g/dL — ABNORMAL LOW (ref 13.0–17.0)
Lymphocytes Relative: 9.3 % — ABNORMAL LOW (ref 12.0–46.0)
Lymphs Abs: 0.8 10*3/uL (ref 0.7–4.0)
MCHC: 32.7 g/dL (ref 30.0–36.0)
MCV: 79.3 fl (ref 78.0–100.0)
Monocytes Absolute: 0.6 10*3/uL (ref 0.1–1.0)
Monocytes Relative: 6.9 % (ref 3.0–12.0)
Neutro Abs: 7.1 10*3/uL (ref 1.4–7.7)
Neutrophils Relative %: 81.6 % — ABNORMAL HIGH (ref 43.0–77.0)
Platelets: 118 10*3/uL — ABNORMAL LOW (ref 150.0–400.0)
RBC: 4.9 Mil/uL (ref 4.22–5.81)
RDW: 26.6 % — ABNORMAL HIGH (ref 11.5–15.5)
WBC: 8.7 10*3/uL (ref 4.0–10.5)

## 2022-03-17 NOTE — Patient Instructions (Signed)
Continue the Torsemide at one twice daily for now  Change positions slowly  Continue with daily weights.    Let's plan on one month follow up

## 2022-03-17 NOTE — Progress Notes (Signed)
Established Patient Office Visit  Subjective   Patient ID: [name], male    DOB: 1932/12/24  Age: 88 y.o. MRN: [id number]  Chief Complaint  Patient presents with   Follow-up    HPI   Seen for medical follow-up.  Refer to last note for details.  He has history of chronic combined systolic and diastolic heart failure, aortic stenosis with previous TAVR, hypertension, type 2 diabetes, atrial fibrillation on chronic anticoagulation with Eliquis, chronic kidney disease.  Recent problem with weight gain and required hospitalization.  He was discharged on torsemide 80 mg daily and has been gradually reducing this.  Was reduced to two 20 mg tablets in the morning and 20 mg at night by cardiology and patient has further reduce this himself to 20 mg twice daily secondary to some dizziness.  So far, no peripheral edema.  His weight is ranging between 147 and 150 at home.  Dyspnea is stable.  Type 2 diabetes fairly well controlled.  Last A1c 7.2%.  Currently on low-dose Januvia.  Apparently did not tolerate Jardiance previously.  Iron deficiency and received iron infusion with recent admission.  We discussed getting follow-up CBC and iron studies today.  No obvious bleeding.  Does remain on low-dose Eliquis 2.5 mg.  Overall stable.  Wife currently battling with some severe back pain.  Patient denies any chest pains or recent increased dyspnea.  Past Medical History:  Diagnosis Date   Aortic stenosis, moderate 09/20/2020   Arthritis    CAD (coronary artery disease)    a. Cath September 2015 LIMA to the LAD patent, SVG to PDA patent, SVG to posterior lateral patent, SVG to OM with a 90% in-stent restenosis at an anastomotic lesion. This was treated with angioplasty. b. cath 04/01/2015 95% ISR in SVG to OM treated with 2.75x24 Synergy DES postdilated to 3.64m, all other grafts patent   Cataract    surgery,B/L   CHF (congestive heart failure) (HCC)    Chronic kidney disease    nephrolithiasis    Diabetes mellitus    TYPE 2   GERD (gastroesophageal reflux disease)    Hernia    Hypercholesterolemia    Hypertension    Incontinence    hx  over 1 year,leaks without awareness   Other and unspecified diseases of appendix    PAF (paroxysmal atrial fibrillation) ([place]    in the setting of ischemia on 03/31/2015   Pancreatitis    [place]   prostate bx=Adenocarcinoam,gleason 3+4=7,PSA=6.75   Shortness of breath dyspnea    Ulcer    hx gastric   Past Surgical History:  Procedure Laterality Date   APPENDECTOMY     BACK SURGERY     CARDIAC CATHETERIZATION N/A 04/01/2015   Procedure: Left Heart Cath and Cors/Grafts Angiography;  Surgeon: JJettie Booze MD;  [facility];  Service: Cardiovascular;  Laterality: N/A;   CARDIAC CATHETERIZATION N/A 04/01/2015   Procedure: Coronary Stent Intervention;  Surgeon: JJettie Booze MD;  [facility];  Service: Cardiovascular;  Laterality: N/A;   CARDIOVERSION N/A 11/01/2019   Procedure: CARDIOVERSION;  Surgeon: OGeralynn Rile MD;  [place]  Service: Cardiovascular;  Laterality: N/A;   CARPAL TUNNEL RELEASE  2004   both hands   CORONARY ARTERY BYPASS GRAFT     CORONARY BALLOON ANGIOPLASTY N/A 08/19/2021   Procedure: CORONARY BALLOON ANGIOPLASTY;  Surgeon: MBurnell Blanks MD;  [facility];  Service: Cardiovascular;  Laterality: N/A;   CORONARY STENT INTERVENTION N/A 09/23/2020   Procedure: CORONARY STENT INTERVENTION;  Surgeon: Sherren Mocha, MD;  Location: [place] [facility];  Service: Cardiovascular;  Laterality: N/A;   CYSTOSCOPY  08/23/12   incomplete emoptying bladder   HERNIA REPAIR     incision and drainage of right chest abscess     INTRAOPERATIVE TRANSTHORACIC ECHOCARDIOGRAM Left 04/28/2021   Procedure: INTRAOPERATIVE TRANSTHORACIC ECHOCARDIOGRAM;  Surgeon: Burnell Blanks, MD;  Location: [place];  Service: Open Heart Surgery;   Laterality: Left;   LAPAROSCOPIC CHOLECYSTECTOMY  09/2009   LEFT HEART CATH AND CORS/GRAFTS ANGIOGRAPHY N/A 08/19/2021   Procedure: LEFT HEART CATH AND CORS/GRAFTS ANGIOGRAPHY;  Surgeon: Burnell Blanks, MD;  Location: [place] [facility];  Service: Cardiovascular;  Laterality: N/A;   LEFT HEART CATHETERIZATION WITH CORONARY ANGIOGRAM N/A 07/19/2014   Procedure: LEFT HEART CATHETERIZATION WITH CORONARY ANGIOGRAM;  Surgeon: Leonie Man, MD;  [facility];  Service: Cardiovascular;  Laterality: N/A;   RECTAL SURGERY     RIGHT/LEFT HEART CATH AND CORONARY/GRAFT ANGIOGRAPHY N/A 09/23/2020   Procedure: RIGHT/LEFT HEART CATH AND CORONARY/GRAFT ANGIOGRAPHY;  Surgeon: Sherren Mocha, MD;  Location: [place] [facility] LAB;  Service: Cardiovascular;  Laterality: N/A;   ROBOT ASSISTED LAPAROSCOPIC RADICAL PROSTATECTOMY  2005   TRANSCATHETER AORTIC VALVE REPLACEMENT, TRANSFEMORAL Bilateral 04/28/2021   Procedure: TRANSCATHETER AORTIC VALVE REPLACEMENT, TRANSFEMORAL;  Surgeon: Burnell Blanks, MD;  Location: [place];  Service: Open Heart Surgery;  Laterality: Bilateral;   ULTRASOUND GUIDANCE FOR VASCULAR ACCESS Bilateral 04/28/2021   Procedure: ULTRASOUND GUIDANCE FOR VASCULAR ACCESS;  Surgeon: Burnell Blanks, MD;  Location: [place];  Service: Open Heart Surgery;  Laterality: Bilateral;    reports that he quit smoking about 49 years ago. His smoking use included cigarettes. He has a 2.50 pack-year smoking history. He has never used smokeless tobacco. He reports that he does not drink alcohol and does not use drugs. family history includes Cancer in his mother; Heart attack in his father. Allergies  Allergen Reactions   Codeine Other (See Comments)    Makes him feel goofy   Morphine Other (See Comments)    Nightmares and felt bad    Review of Systems  Constitutional:  Negative for chills and fever.  Respiratory:  Negative for cough, hemoptysis, shortness of breath and wheezing.    Cardiovascular:  Negative for chest pain and orthopnea.  Gastrointestinal:  Negative for nausea and vomiting.  Neurological:  Positive for dizziness.     Objective:     BP 116/60 (BP Location: Left Arm, Patient Position: Sitting, Cuff Size: Normal)   Pulse 61   Temp (!) 97.5 F (36.4 C) (Oral)   Ht '5\' 9"'$  (1.753 m)   Wt 150 lb 1.6 oz (68.1 kg)   SpO2 96%   BMI 22.17 kg/m    Physical Exam Vitals reviewed.  Constitutional:      Appearance: Normal appearance.  Cardiovascular:     Rate and Rhythm: Normal rate.  Pulmonary:     Effort: Pulmonary effort is normal.     Breath sounds: Normal breath sounds.  Musculoskeletal:     Right lower leg: No edema.     Left lower leg: No edema.     Comments: He has bandage on right lower leg secondary to recent abrasion.  Neurological:     General: No focal deficit present.     Mental Status: He is alert.     Results for orders placed or performed  in visit on 03/17/22  CBC with Differential/Platelet  Result Value Ref Range   WBC 8.7 4.0 - 10.5 K/uL   RBC 4.90 4.22 - 5.81 Mil/uL   Hemoglobin 12.7 (L) 13.0 - 17.0 g/dL   HCT 38.9 (L) 39.0 - 52.0 %   MCV 79.3 78.0 - 100.0 fl   MCHC 32.7 30.0 - 36.0 g/dL   RDW 26.6 (H) 11.5 - 15.5 %   Platelets 118.0 (L) 150.0 - 400.0 K/uL   Neutrophils Relative % 81.6 (H) 43.0 - 77.0 %   Lymphocytes Relative 9.3 (L) 12.0 - 46.0 %   Monocytes Relative 6.9 3.0 - 12.0 %   Eosinophils Relative 1.6 0.0 - 5.0 %   Basophils Relative 0.6 0.0 - 3.0 %   Neutro Abs 7.1 1.4 - 7.7 K/uL   Lymphs Abs 0.8 0.7 - 4.0 K/uL   Monocytes Absolute 0.6 0.1 - 1.0 K/uL   Eosinophils Absolute 0.1 0.0 - 0.7 K/uL   Basophils Absolute 0.0 0.0 - 0.1 K/uL      The ASCVD Risk score (Arnett DK, et al., 2019) failed to calculate for the following reasons:   The 2019 ASCVD risk score is only valid for ages 75 to 64   The patient has a prior MI or stroke diagnosis    Assessment & Plan:   Problem List Items Addressed This  Visit       Unprioritized   Anemia   Relevant Orders   CBC with Differential/Platelet (Completed)   Iron and TIBC   Hypertension - Primary   Congestive heart failure (CHF) ([place])  Weight has declined from 156 pounds last visit to current weight of 150 pounds.  Weight has been stable by home readings.  Patient currently down to torsemide 20 mg twice daily.  Recent basic metabolic panel per cardiology and electrolytes stable. -Continue close home monitoring of weights and be in touch for any weight gain of more than 2 pounds in 1 day or 5 pounds 1 week -He is complaining of some lightheadedness with standing but blood pressure today seated and standing unchanged with no orthostatic change.  -Check follow-up CBC along with iron studies.  History of iron deficiency anemia and recent iron infusion.  -Set up 1 month follow-up.  Reassess A1c then.  Return in about 1 month (around 04/16/2022).    Carolann Littler, MD

## 2022-03-17 NOTE — Progress Notes (Unsigned)
HEART AND [place]                                     Cardiology Office Note:    Date:  03/19/2022   ID:  [name], DOB 11-23-1932, MRN [id number]  PCP:  [name], MD  [facility] Cardiologist:  [name], MD / Dr. Angelena Form & Dr. [name]  (TAVR) [facility] Electrophysiologist:  None   Referring MD: [name], MD   1 year s/p TAVR  History of Present Illness:    [name] is a 88 y.o. male with a hx of arthritis, COPD, LBBB, CAD s/p 4V CABG in 2009 by PVT and multiple subsequent PCIs, CKD stage 3-4, diabetes mellitus, GERD, HTN, HLD, carotid artery disease, chronic diastolic CHF, persistent atrial fibrillation on Eliquis, prostate cancer and severe LFLG aortic stenosis s/p TAVR (04/28/21) who presents to clinic for follow up.   Patient's cardiac history dates back to 2008 when he underwent multivessel coronary artery bypass grafting by Dr. Prescott Gum.  He has been followed intermittently ever since by Dr. Percival Spanish.   Patient has a long history of CAD s/p CABG x4 with subsequent PCIs. Cardiopulmonary exercise test in 2020 showed mixed restrictive/obstructive pattern but did have submaximal effort during exercise. Heart monitor in 09/2019 showed 100% atrial fibrillation with controlled ventricular rate and ventricular bigeminy/trigeminy. After he was adequately anticoagulated, he underwent DCCV in 10/2019. Immediately following DCCV, he remained in atrial fibrillation. However, post-procedure ECG did show normal sinus rhythm. Unfortunately, he was back in atrial fibrillation at follow-up visit in 12/2019. He continued to complain of dyspnea at this time and was referred to Pulmonology.  He was felt to have moderate obstruction with moderate restriction; positive bronchodilator response; decreased diffusion capacity by PFTs and started on inhalers.   He has undergone multiple previous stent procedures for vein graft  disease most recently in December 2021 when he underwent PCI of saphenous vein graft to the obtuse marginal branch of the left circumflex coronary artery.  Catheterization at that time revealed that all of the other bypass grafts remained widely patent and free of significant disease.  Patient has had aortic stenosis that has gradually progressed in severity. Follow-up echocardiogram performed Mar 12, 2021 revealed further progression of disease.  Left ventricular ejection fraction was estimated 45 to 50%.  The aortic valve is calcified with severe calcification, thickening, and restricted leaflet mobility involving all 3 leaflets.  Peak velocity across the aortic valve measured 2.7 m/s corresponding to mean transvalvular gradient estimated 21 mmHg but aortic valve area calculated only 0.63 cm with DVI notably quite low at 0.20 and stroke-volume index only 21.   He was evaluated by the multidisciplinary valve team and underwent successful TAVR with a 29 mm Medtronic Evolut Pro + THV via the TF approach on 04/28/21. Post operative echo showed EF 40%, normally functioning TAVR with a mean gradient of 4 mmHg and trivial PVL. Plavix was discontinued and he was started on aspirin 81 mg daily and resumed on Eliquis. One month echo s/p TAVR showed EF 40-45%, normally functioning TAVR with a mean gradient of 2 mm hg and trivial PVL.  He continued to have issues with volume overload and chest pain. He underwent repeat cath on 08/19/21 which showed severe restenosis in the distal body of SVG to OM1 in the previously stented  segment and underwent successful PTCA with balloon angioplasty only of the restenosis within the distal body of the vein graft stented segment. He was discharged home on Plavix and Eliquis for 6 months. He was sent home on double the dose of Torsemide.   He was admitted in 01/2022 with acute on chronic CHF. He was also found to have iron deficiency anemia and was transfused with IV iron. Echo showed EF  30-35%, severe RV dysfunction with normally functioning TAVR with a mean gradient of 2.3 mmHg and trivial PVL.  He was last seen by Dr. Percival Spanish in May 2023 and doing relatively well. Torsemide decreased to $RemoveBefo'40mg'kNWtezNehUq$  in AM and $Remo'20mg'OLvbN$  in PM due to dehydration. Follow up creat 1.68 which was within previous baseline. Later PCP decreased torsemide down to $Remov'20mg'SXTYYQ$  in AM and $Remo'20mg'tZUfF$  in PM. This was later changed to $RemoveBe'20mg'bVSSphnfk$  in AM and $Remo'20mg'vYfgR$  in PM by Dr. Elease Hashimoto.   Today he presents to clinic for follow-up. He is doing well today. Echo was cancelled due to recently having one in April. He is feeling much improved since his recent admission. No chest pain today. Breathing is better. Swelling is well controlled. He has gait unsteadiness which he attributes to Plavix.    Past Medical History:  Diagnosis Date   Aortic stenosis, moderate 09/20/2020   Arthritis    CAD (coronary artery disease)    a. Cath September 2015 LIMA to the LAD patent, SVG to PDA patent, SVG to posterior lateral patent, SVG to OM with a 90% in-stent restenosis at an anastomotic lesion. This was treated with angioplasty. b. cath 04/01/2015 95% ISR in SVG to OM treated with 2.75x24 Synergy DES postdilated to 3.68mm, all other grafts patent   Cataract    surgery,B/L   CHF (congestive heart failure) (HCC)    Chronic kidney disease    nephrolithiasis   Diabetes mellitus    TYPE 2   GERD (gastroesophageal reflux disease)    Hernia    Hypercholesterolemia    Hypertension    Incontinence    hx  over 1 year,leaks without awareness   Other and unspecified diseases of appendix    PAF (paroxysmal atrial fibrillation) ([place])    in the setting of ischemia on 03/31/2015   Pancreatitis    Prostate CA ([place]) 03/01/04   prostate bx=Adenocarcinoam,gleason 3+4=7,PSA=6.75   Shortness of breath dyspnea    Ulcer    hx gastric    Past Surgical History:  Procedure Laterality Date   APPENDECTOMY     BACK SURGERY     CARDIAC CATHETERIZATION N/A 04/01/2015    Procedure: Left Heart Cath and Cors/Grafts Angiography;  Surgeon: Jettie Booze, MD;  Location: [place] [facility];  Service: Cardiovascular;  Laterality: N/A;   CARDIAC CATHETERIZATION N/A 04/01/2015   Procedure: Coronary Stent Intervention;  Surgeon: Jettie Booze, MD;  Location: [place] [facility];  Service: Cardiovascular;  Laterality: N/A;   CARDIOVERSION N/A 11/01/2019   Procedure: CARDIOVERSION;  Surgeon: Geralynn Rile, MD;  Location: [place];  Service: Cardiovascular;  Laterality: N/A;   CARPAL TUNNEL RELEASE  2004   both hands   CORONARY ARTERY BYPASS GRAFT     CORONARY BALLOON ANGIOPLASTY N/A 08/19/2021   Procedure: CORONARY BALLOON ANGIOPLASTY;  Surgeon: Burnell Blanks, MD;  Location: [place] [facility];  Service: Cardiovascular;  Laterality: N/A;   CORONARY STENT INTERVENTION N/A 09/23/2020   Procedure: CORONARY STENT INTERVENTION;  Surgeon: Sherren Mocha, MD;  Location: [place] [facility];  Service: Cardiovascular;  Laterality: N/A;   CYSTOSCOPY  08/23/12   incomplete emoptying bladder   HERNIA REPAIR     incision and drainage of right chest abscess     INTRAOPERATIVE TRANSTHORACIC ECHOCARDIOGRAM Left 04/28/2021   Procedure: INTRAOPERATIVE TRANSTHORACIC ECHOCARDIOGRAM;  Surgeon: Burnell Blanks, MD;  Location: [place];  Service: Open Heart Surgery;  Laterality: Left;   LAPAROSCOPIC CHOLECYSTECTOMY  09/2009   LEFT HEART CATH AND CORS/GRAFTS ANGIOGRAPHY N/A 08/19/2021   Procedure: LEFT HEART CATH AND CORS/GRAFTS ANGIOGRAPHY;  Surgeon: Burnell Blanks, MD;  Location: [place] [facility];  Service: Cardiovascular;  Laterality: N/A;   LEFT HEART CATHETERIZATION WITH CORONARY ANGIOGRAM N/A 07/19/2014   Procedure: LEFT HEART CATHETERIZATION WITH CORONARY ANGIOGRAM;  Surgeon: Leonie Man, MD;  Location: [facility];  Service: Cardiovascular;  Laterality: N/A;   RECTAL SURGERY     RIGHT/LEFT HEART CATH AND CORONARY/GRAFT ANGIOGRAPHY N/A  09/23/2020   Procedure: RIGHT/LEFT HEART CATH AND CORONARY/GRAFT ANGIOGRAPHY;  Surgeon: Sherren Mocha, MD;  Location: [place] [facility];  Service: Cardiovascular;  Laterality: N/A;   ROBOT ASSISTED LAPAROSCOPIC RADICAL PROSTATECTOMY  2005   TRANSCATHETER AORTIC VALVE REPLACEMENT, TRANSFEMORAL Bilateral 04/28/2021   Procedure: TRANSCATHETER AORTIC VALVE REPLACEMENT, TRANSFEMORAL;  Surgeon: Burnell Blanks, MD;  Location: [place];  Service: Open Heart Surgery;  Laterality: Bilateral;   ULTRASOUND GUIDANCE FOR VASCULAR ACCESS Bilateral 04/28/2021   Procedure: ULTRASOUND GUIDANCE FOR VASCULAR ACCESS;  Surgeon: Burnell Blanks, MD;  Location: [place];  Service: Open Heart Surgery;  Laterality: Bilateral;    Current Medications: Current Meds  Medication Sig   albuterol (VENTOLIN HFA) 108 (90 Base) MCG/ACT inhaler Inhale 2 puffs into the lungs every 6 (six) hours as needed for wheezing or shortness of breath.   amLODipine (NORVASC) 5 MG tablet TAKE 1 TABLET BY MOUTH EVERY DAY   BD PEN NEEDLE NANO 2ND GEN 32G X 4 MM MISC USE AS DIRECTED.   blood glucose meter kit and supplies KIT Dispense based on patient and insurance preference. Use up to four times daily as directed. (FOR ICD-9 250.00, 250.01).   budesonide-formoterol (SYMBICORT) 160-4.5 MCG/ACT inhaler Inhale 2 puffs into the lungs in the morning and at bedtime. (Patient taking differently: Inhale 2 puffs into the lungs 2 (two) times daily as needed (sob/wheezing).)   Carboxymethylcellul-Glycerin (LUBRICATING EYE DROPS OP) Place 1 drop into both eyes daily as needed (dry eyes).   carvedilol (COREG) 6.25 MG tablet TAKE 1 TABLET (6.25 MG TOTAL) BY MOUTH 2 (TWO) TIMES DAILY WITH A MEAL.   clopidogrel (PLAVIX) 75 MG tablet Take 1 tablet (75 mg total) by mouth daily with breakfast.   Cyanocobalamin (VITAMIN B-12 PO) Take 1 tablet by mouth daily.   docusate sodium (STOOL SOFTENER) 100 MG capsule Take 1 capsule (100 mg total) by mouth 2 (two)  times daily as needed for mild constipation.   ELIQUIS 2.5 MG TABS tablet TAKE 1 TABLET BY MOUTH TWICE A DAY   FLUoxetine (PROZAC) 10 MG capsule Take by mouth daily.   Homeopathic Products [facility] RELIEF EX) Apply 1 application topically daily as needed (knee pain).   hydrocortisone 2.5 % cream Apply 1 application topically daily as needed (facial breakouts).   Insulin Degludec (TRESIBA) 100 UNIT/ML SOLN Inject 10 Units into the skin daily.   JANUVIA 50 MG tablet TAKE 1 TABLET BY MOUTH EVERY DAY   Multiple Vitamins-Minerals (ICAPS AREDS 2 PO) Take 1 capsule by mouth daily.   nitroGLYCERIN (NITROSTAT) 0.4 MG SL tablet PLACE  1 TABLET UNDER THE TONGUE EVERY 5 MINUTES X 3 DOSES AS NEEDED FOR CHEST PAIN   pantoprazole (PROTONIX) 40 MG tablet TAKE 1 TABLET BY MOUTH EVERY DAY   potassium chloride (KLOR-CON 10) 10 MEQ tablet Take 1 tablet (10 mEq total) by mouth daily.   rosuvastatin (CRESTOR) 20 MG tablet TAKE 1 TABLET BY MOUTH EVERY DAY   spironolactone (ALDACTONE) 25 MG tablet TAKE 1 TABLET (25 MG TOTAL) BY MOUTH DAILY.   torsemide (DEMADEX) 20 MG tablet Take 20 mg by mouth 2 (two) times daily. 1 tablet in the morning and 1 tablet in the evening   TRESIBA FLEXTOUCH 100 UNIT/ML FlexTouch Pen INJECT 10 UNITS INTO THE SKIN DAILY   [DISCONTINUED] sertraline (ZOLOFT) 50 MG tablet TAKE 1 TABLET BY MOUTH EVERY DAY   [DISCONTINUED] torsemide (DEMADEX) 20 MG tablet Take 2 tablet in the morning and 1 tablet in the evening (Patient taking differently: Take 1 tablet in the morning and 1 tablet in the evening)     Allergies:   Codeine and Morphine   Social History   Socioeconomic History   Marital status: Married    Spouse name: Not on file   Number of children: 2   Years of education: Not on file   Highest education level: Not on file  Occupational History    Employer: RETIRED    Comment: Retired  Tobacco Use   Smoking status: Former    Packs/day: 0.50    Years: 5.00    Pack years: 2.50     Types: Cigarettes    Quit date: 07/20/1972    Years since quitting: 49.6   Smokeless tobacco: Never   Tobacco comments:    quit s  Vaping Use   Vaping Use: Never used  Substance and Sexual Activity   Alcohol use: No   Drug use: No    Comment: quit smoking 40 years ago   Sexual activity: Never  Other Topics Concern   Not on file  Social History Narrative   Retired   Married      Social Determinants of Radio broadcast assistant Strain: Not on Art therapist Insecurity: No Food Insecurity   Worried About Charity fundraiser in the Last Year: Never true   Arboriculturist in the Last Year: Never true  Transportation Needs: Public librarian (Medical): Yes   Lack of Transportation (Non-Medical): Yes  Physical Activity: Not on file  Stress: Not on file  Social Connections: Not on file     Family History: The patient's family history includes Cancer in his mother; Heart attack in his father.  ROS:   Please see the history of present illness.    All other systems reviewed and are negative.  EKGs/Labs/Other Studies Reviewed:    The following studies were reviewed today:    TAVR OPERATIVE NOTE     Date of Procedure:                04/28/2021   Preoperative Diagnosis:      Severe Aortic Stenosis   Postoperative Diagnosis:    Same   Procedure:        Transcatheter Aortic Valve Replacement - Percutaneous Right Transfemoral Approach             Medtronic CoreValve Evolut Pro Plus (size 29 mm, serial # [id number])              Co-Surgeons:  Valentina Gu. [name], MD and Lauree Chandler, MD   Anesthesiologist:                  Myrtie Soman, MD   Echocardiographer:              Jenkins Rouge, MD   Pre-operative Echo Findings: Severe aortic stenosis Low normal left ventricular systolic function   Post-operative Echo Findings: Trivial paravalvular leak Unchanged left ventricular systolic function    _______________   Echo 05/12/21 IMPRESSIONS   1. Septal , apical and inferior wall hypokinesis . Left ventricular  ejection fraction, by estimation, is 40 to 45%. The left ventricle has  mildly decreased function. The left ventricle demonstrates regional wall  motion abnormalities (see scoring  diagram/findings for description). The left ventricular internal cavity  size was mildly dilated. There is moderate left ventricular hypertrophy.  Left ventricular diastolic parameters are indeterminate.   2. Right ventricular systolic function is normal. The right ventricular  size is normal. There is normal pulmonary artery systolic pressure.   3. Left atrial size was moderately dilated.   4. Right atrial size was mildly dilated.   5. The mitral valve is abnormal. Mild mitral valve regurgitation. No  evidence of mitral stenosis. Moderate mitral annular calcification.   6. Post TAVR 04/28/21 with 29 mm Medtronic Evolut Pro valve Trivial PVL  seen best on PSSA images 6:00 mean gradient 4 peak 6 mmHg AVR 2.8 cm2. The  aortic valve has been repaired/replaced. Aortic valve regurgitation is not  visualized. No aortic stenosis  is present. Procedure Date: 0712/2022.   7. The inferior vena cava is normal in size with greater than 50%  respiratory variability, suggesting right atrial pressure of 3 mmHg.   ___________________  Echo 06/04/21 IMPRESSIONS  1. 29 mm Corevalve TAVR (04/28/2021). Vmax 1.2 m/s, MG 3 mmHG, EOA 3.33  cm2, DI 0.74. Trivial paravalvular leak noted. Normal prosthesis . The  aortic valve has been repaired/replaced. Aortic valve regurgitation is  trivial. There is a 29 mm Medtronic  Evolut Pro( TAVR) valve present in the aortic position. Procedure Date:  04/28/2021.   2. Left ventricular ejection fraction, by estimation, is 40 to 45%. The  left ventricle has mildly decreased function. The left ventricle  demonstrates global hypokinesis. There is moderate asymmetric left   ventricular hypertrophy of the basal-septal  segment. Left ventricular diastolic function could not be evaluated.   3. Right ventricular systolic function is mildly reduced. The right  ventricular size is mildly enlarged. There is mildly elevated pulmonary  artery systolic pressure. The estimated right ventricular systolic  pressure is 90.2 mmHg.   4. Left atrial size was mildly dilated.   5. Right atrial size was mildly dilated.   6. The mitral valve is grossly normal. Mild mitral valve regurgitation.  No evidence of mitral stenosis.   7. The inferior vena cava is normal in size with <50% respiratory  variability, suggesting right atrial pressure of 8 mmHg.   Comparison(s): No significant change from prior study.   ___________________  LHC 08/19/21  CORONARY BALLOON ANGIOPLASTY  LEFT HEART CATH AND CORS/GRAFTS ANGIOGRAPHY   Conclusion      LM lesion is 25% stenosed.   Ost LM to Mid LM lesion is 50% stenosed.   Mid LAD lesion is 80% stenosed.   Mid Cx to Dist Cx lesion is 80% stenosed.   Ost RCA to Prox RCA lesion is 100% stenosed.   Origin lesion is 40% stenosed.   RPDA lesion  is 30% stenosed.   SVG to OM1 is patent. Dist Graft to Insertion lesion is 95% stenosed.   Balloon angioplasty was performed using a BALLN [place] EMERGE MR [id number].   Post intervention, there is a 30% residual stenosis.   LIMA to LAD is patent   SVG to PDA is patent   SVG to OM2 is patent   Severe triple vessel CAD s/p 4V CABG with 4/4 patent grafts Unable to selectively engage the left main artery due to obstruction of the ostium by the TAVR valve stent cage. Based on non-selective angiography the LAD and Circumflex are patent with anatomy likely unchanged from cath in 2022 but cannot definitively say this. All three grafts to the left system are patent.  Chronic occlusion proximal RCA Patent LIMA to LAD, SVG to OM1, SVG to OM2 and SVG to PDA Severe restenosis in the distal body of SVG to OM1 in the  previously stented segment Successful PTCA with balloon angioplasty only of the restenosis within the distal body of the vein graft stented segment   Recommendations: I would plan to d/c home tomorrow with plans for Plavix and Eliquis for 6 months if possible. Stop ASA before discharge.   _________________________    Echo 02/06/22 IMPRESSIONS   1. Left ventricular ejection fraction, by estimation, is 30 to 35%. Left  ventricular ejection fraction by 2D MOD biplane is 35.0 %. The left  ventricle has moderately decreased function. The left ventricle  demonstrates global hypokinesis. There is mild  left ventricular hypertrophy. Left ventricular diastolic function could  not be evaluated. Elevated left ventricular end-diastolic pressure.   2. Right ventricular systolic function is severely reduced. The right  ventricular size is mildly enlarged. There is mildly elevated pulmonary  artery systolic pressure. The estimated right ventricular systolic  pressure is 52.8 mmHg.   3. Left atrial size was moderately dilated.   4. The mitral valve is grossly normal. Trivial mitral valve  regurgitation.   5. The aortic valve has been repaired/replaced. Aortic valve  regurgitation is trivial. There is a 29 mm Medtronic Evolut Pro( TAVR)  valve present in the aortic position. Procedure Date: 04/28/21. Echo  findings are consistent with perivalvular leak of the   aortic prosthesis. Aortic regurgitation PHT measures 696 msec. Aortic  valve area, by VTI measures 2.77 cm. Aortic valve mean gradient measures  2.3 mmHg. Aortic valve Vmax measures 1.13 m/s.   6. Aortic dilatation noted. There is mild dilatation of the ascending  aorta, measuring 40 mm.   7. The inferior vena cava is normal in size with <50% respiratory  variability, suggesting right atrial pressure of 8 mmHg.   Comparison(s): Changes from prior study are noted. 06/04/2021: LVEF 40-45%.   EKG:  EKG is NOT ordered today.  Recent  Labs: 08/18/2021: TSH 2.079 02/01/2022: Pro B Natriuretic peptide (BNP) 1,846.0 02/05/2022: B Natriuretic Peptide 1,573.0 02/06/2022: ALT 17 02/08/2022: Magnesium 2.0 02/15/2022: BUN 29; Creatinine, Ser 1.68; Potassium 4.1; Sodium 136 03/17/2022: Hemoglobin 12.7; Platelets 118.0  Recent Lipid Panel    Component Value Date/Time   CHOL 122 08/19/2021 0234   CHOL 97 (L) 01/01/2021 0848   TRIG 68 08/19/2021 0234   TRIG 306 (HH) 09/27/2006 1154   HDL 35 (L) 08/19/2021 0234   HDL 33 (L) 01/01/2021 0848   CHOLHDL 3.5 08/19/2021 0234   VLDL 14 08/19/2021 0234   LDLCALC 73 08/19/2021 0234   LDLCALC 46 01/01/2021 0848   LDLDIRECT 143.3 11/23/2007 0000     Risk  Assessment/Calculations:    CHA2DS2-VASc Score = 6  This indicates a 9.7% annual risk of stroke. The patient's score is based upon: CHF History: 1 HTN History: 1 Diabetes History: 1 Stroke History: 0 Vascular Disease History: 1 Age Score: 2 Gender Score: 0     Physical Exam:    VS:  BP (!) 118/58   Pulse 70   Ht $R'5\' 9"'Rt$  (1.753 m)   Wt 151 lb (68.5 kg)   SpO2 99%   BMI 22.30 kg/m     Wt Readings from Last 3 Encounters:  03/19/22 151 lb (68.5 kg)  03/17/22 150 lb 1.6 oz (68.1 kg)  02/15/22 151 lb 9.6 oz (68.8 kg)     GEN:  Well nourished, well developed in no acute distress HEENT: Normal NECK: mild JVD LYMPHATICS: No lymphadenopathy CARDIAC: irreg irreg, no murmurs, rubs, gallops RESPIRATORY:clear  ABDOMEN: Soft, non-tender, non-distended MUSCULOSKELETAL: trace pretibial edema.  SKIN: Warm and dry.  NEUROLOGIC:  Alert and oriented x 3 PSYCHIATRIC:  Normal affect   ASSESSMENT:    1. S/P TAVR (transcatheter aortic valve replacement)   2. CAD in native artery   3. Chronic kidney disease (CKD), stage IV (severe) ([place])   4. Chronic combined systolic (congestive) and diastolic (congestive) heart failure ([place])   5. Bilateral carotid artery stenosis   6. Atrial fibrillation, chronic (HCC)      PLAN:    In  order of problems listed above:   Severe AS s/p TAVR: echo done 02/06/22 during an admission for CHF showed EF 30-35%, severe RV dysfunction with normally functioning TAVR with a mean gradient of 2.3 mmHg and trivial PVL. He is currently doing much better with NYHA class II symptoms. Continue on Plavix and Eliquis. Unfortunately, his most recent echo will not count for the TAVR registry, so I have r/s this for August.   CAD with chest pain: he underwent repeat cath on 08/19/21 which showed severe restenosis in the distal body of SVG to OM1 in the previously stented segment and underwent successful PTCA with balloon angioplasty only of the restenosis within the distal body of the vein graft stented segment. He was discharged home on Plavix and Eliquis. He has been tolerating this well and would like to continue on this vs changing plavix to aspirin.   CKD stage IIIB: creat ~1.68 on most recent check. Baseline creat 1.5-1.9.  Chronic combined S/D CHF: weight 151 lbs today which is his new baseline. Continue torsemide 20 in am and $Remo'20mg'NSYoi$  in PM.   Carotid arterial disease: 36-14% LICA stenosis. Repeat dopplers due in 03/2022. Will have that set up. Continue medical therapy.   Persistent atrial fibrillation: continue Eliquis. Rate well controlled.   Medication Adjustments/Labs and Tests Ordered: Current medicines are reviewed at length with the patient today.  Concerns regarding medicines are outlined above.  Orders Placed This Encounter  Procedures   ECHOCARDIOGRAM COMPLETE     No orders of the defined types were placed in this encounter.     Patient Instructions  Medication Instructions:  Your physician recommends that you continue on your current medications as directed. Please refer to the Current Medication list given to you today.  *If you need a refill on your cardiac medications before your next appointment, please call your pharmacy*   Lab Work: None ordered   If you have labs  (blood work) drawn today and your tests are completely normal, you will receive your results only by: [place] (if you have MyChart) OR A  paper copy in the mail If you have any lab test that is abnormal or we need to change your treatment, we will call you to review the results.   Testing/Procedures: Your physician has requested that you have an echocardiogram in August. Echocardiography is a painless test that uses sound waves to create images of your heart. It provides your doctor with information about the size and shape of your heart and how well your heart's chambers and valves are working. This procedure takes approximately one hour. There are no restrictions for this procedure.    Follow-Up: Follow up as scheduled    Other Instructions   Important Information About Sugar         Signed, Angelena Form, PA-C  03/19/2022 11:31 AM    [place]

## 2022-03-18 LAB — IRON AND TIBC
Iron Saturation: 17 % (ref 15–55)
Iron: 56 ug/dL (ref 38–169)
Total Iron Binding Capacity: 322 ug/dL (ref 250–450)
UIBC: 266 ug/dL (ref 111–343)

## 2022-03-19 ENCOUNTER — Ambulatory Visit: Payer: Medicare PPO | Admitting: Physician Assistant

## 2022-03-19 ENCOUNTER — Other Ambulatory Visit (HOSPITAL_COMMUNITY): Payer: Medicare PPO

## 2022-03-19 VITALS — BP 118/58 | HR 70 | Ht 69.0 in | Wt 151.0 lb

## 2022-03-19 DIAGNOSIS — I2510 Atherosclerotic heart disease of native coronary artery without angina pectoris: Secondary | ICD-10-CM

## 2022-03-19 DIAGNOSIS — I482 Chronic atrial fibrillation, unspecified: Secondary | ICD-10-CM

## 2022-03-19 DIAGNOSIS — I5042 Chronic combined systolic (congestive) and diastolic (congestive) heart failure: Secondary | ICD-10-CM

## 2022-03-19 DIAGNOSIS — Z952 Presence of prosthetic heart valve: Secondary | ICD-10-CM | POA: Diagnosis not present

## 2022-03-19 DIAGNOSIS — I6523 Occlusion and stenosis of bilateral carotid arteries: Secondary | ICD-10-CM

## 2022-03-19 DIAGNOSIS — N184 Chronic kidney disease, stage 4 (severe): Secondary | ICD-10-CM

## 2022-03-19 NOTE — Patient Instructions (Signed)
Medication Instructions:  Your physician recommends that you continue on your current medications as directed. Please refer to the Current Medication list given to you today.  *If you need a refill on your cardiac medications before your next appointment, please call your pharmacy*   Lab Work: None ordered   If you have labs (blood work) drawn today and your tests are completely normal, you will receive your results only by: [place] (if you have MyChart) OR A paper copy in the mail If you have any lab test that is abnormal or we need to change your treatment, we will call you to review the results.   Testing/Procedures: Your physician has requested that you have an echocardiogram in August. Echocardiography is a painless test that uses sound waves to create images of your heart. It provides your doctor with information about the size and shape of your heart and how well your heart's chambers and valves are working. This procedure takes approximately one hour. There are no restrictions for this procedure.    Follow-Up: Follow up as scheduled    Other Instructions   Important Information About Sugar

## 2022-03-29 ENCOUNTER — Ambulatory Visit: Payer: Medicare PPO

## 2022-03-29 ENCOUNTER — Ambulatory Visit (INDEPENDENT_AMBULATORY_CARE_PROVIDER_SITE_OTHER): Payer: Medicare PPO

## 2022-03-29 VITALS — Ht 69.0 in | Wt 145.0 lb

## 2022-03-29 DIAGNOSIS — Z0000 Encounter for general adult medical examination without abnormal findings: Secondary | ICD-10-CM | POA: Diagnosis not present

## 2022-03-29 DIAGNOSIS — I5030 Unspecified diastolic (congestive) heart failure: Secondary | ICD-10-CM

## 2022-03-29 DIAGNOSIS — E1121 Type 2 diabetes mellitus with diabetic nephropathy: Secondary | ICD-10-CM

## 2022-03-29 NOTE — Progress Notes (Signed)
I connected with [name] today by telephone and verified that I am speaking with the correct person using two identifiers. Location patient: home Location provider: work Persons participating in the virtual visit: [name], Mrs. Agustus, Mane LPN.   I discussed the limitations, risks, security and privacy concerns of performing an evaluation and management service by telephone and the availability of in person appointments. I also discussed with the patient that there may be a patient responsible charge related to this service. The patient expressed understanding and verbally consented to this telephonic visit.    Interactive audio and video telecommunications were attempted between this provider and patient, however failed, due to patient having technical difficulties OR patient did not have access to video capability.  We continued and completed visit with audio only.     Vital signs may be patient reported or missing.  Subjective:   [name] is a 88 y.o. male who presents for Medicare Annual/Subsequent preventive examination.  Review of Systems     Cardiac Risk Factors include: advanced age (>65mn, >>89women);diabetes mellitus;dyslipidemia;hypertension;male gender     Objective:    Today's Vitals   03/29/22 1006  Weight: 145 lb (65.8 kg)  Height: 5' 9" (1.753 m)   Body mass index is 21.41 kg/m.     03/29/2022   10:12 AM 08/18/2021    2:02 PM 04/27/2021   11:15 PM 04/27/2021   12:07 PM 04/07/2021    3:06 PM 03/02/2021    8:24 AM 09/20/2020   11:47 AM  Advanced Directives  Does Patient Have a Medical Advance Directive? _0  Yes No  Type of AVisual merchandiserof ACastleLiving will   Does patient want to make changes to medical advance directive?      No - Patient declined   [place] of [place]?      No - copy requested   Would patient like information on creating a medical advance directive?  No -  Guardian declined No - Patient declined  No - Patient declined  No - Patient declined    Current Medications (verified) Outpatient Encounter Medications as of 03/29/2022  Medication Sig   albuterol (VENTOLIN HFA) 108 (90 Base) MCG/ACT inhaler Inhale 2 puffs into the lungs every 6 (six) hours as needed for wheezing or shortness of breath.   amLODipine (NORVASC) 5 MG tablet TAKE 1 TABLET BY MOUTH EVERY DAY   BD PEN NEEDLE NANO 2ND GEN 32G X 4 MM MISC USE AS DIRECTED.   blood glucose meter kit and supplies KIT Dispense based on patient and insurance preference. Use up to four times daily as directed. (FOR ICD-9 250.00, 250.01).   budesonide-formoterol (SYMBICORT) 160-4.5 MCG/ACT inhaler Inhale 2 puffs into the lungs in the morning and at bedtime. (Patient taking differently: Inhale 2 puffs into the lungs 2 (two) times daily as needed (sob/wheezing).)   Carboxymethylcellul-Glycerin (LUBRICATING EYE DROPS OP) Place 1 drop into both eyes daily as needed (dry eyes).   carvedilol (COREG) 6.25 MG tablet TAKE 1 TABLET (6.25 MG TOTAL) BY MOUTH 2 (TWO) TIMES DAILY WITH A MEAL.   clopidogrel (PLAVIX) 75 MG tablet Take 1 tablet (75 mg total) by mouth daily with breakfast.   Cyanocobalamin (VITAMIN B-12 PO) Take 1 tablet by mouth daily.   docusate sodium (STOOL SOFTENER) 100 MG capsule Take 1 capsule (100 mg total) by mouth 2 (two) times daily as needed for mild constipation.  ELIQUIS 2.5 MG TABS tablet TAKE 1 TABLET BY MOUTH TWICE A DAY   FLUoxetine (PROZAC) 10 MG capsule Take by mouth daily.   Homeopathic Products [employer] RELIEF EX) Apply 1 application topically daily as needed (knee pain).   hydrocortisone 2.5 % cream Apply 1 application topically daily as needed (facial breakouts).   Insulin Degludec (TRESIBA) 100 UNIT/ML SOLN Inject 10 Units into the skin daily.   JANUVIA 50 MG tablet TAKE 1 TABLET BY MOUTH EVERY DAY   Multiple Vitamins-Minerals (ICAPS AREDS 2 PO) Take 1 capsule by mouth daily.    nitroGLYCERIN (NITROSTAT) 0.4 MG SL tablet PLACE 1 TABLET UNDER THE TONGUE EVERY 5 MINUTES X 3 DOSES AS NEEDED FOR CHEST PAIN   pantoprazole (PROTONIX) 40 MG tablet TAKE 1 TABLET BY MOUTH EVERY DAY   potassium chloride (KLOR-CON 10) 10 MEQ tablet Take 1 tablet (10 mEq total) by mouth daily.   rosuvastatin (CRESTOR) 20 MG tablet TAKE 1 TABLET BY MOUTH EVERY DAY   spironolactone (ALDACTONE) 25 MG tablet TAKE 1 TABLET (25 MG TOTAL) BY MOUTH DAILY.   torsemide (DEMADEX) 20 MG tablet Take 20 mg by mouth 2 (two) times daily. 1 tablet in the morning and 1 tablet in the evening   TRESIBA FLEXTOUCH 100 UNIT/ML FlexTouch Pen INJECT 10 UNITS INTO THE SKIN DAILY   No facility-administered encounter medications on file as of 03/29/2022.    Allergies (verified) Codeine and Morphine   History: Past Medical History:  Diagnosis Date   Aortic stenosis, moderate 09/20/2020   Arthritis    CAD (coronary artery disease)    a. Cath September 2015 LIMA to the LAD patent, SVG to PDA patent, SVG to posterior lateral patent, SVG to OM with a 90% in-stent restenosis at an anastomotic lesion. This was treated with angioplasty. b. cath 04/01/2015 95% ISR in SVG to OM treated with 2.75x24 Synergy DES postdilated to 3.42m, all other grafts patent   Cataract    surgery,B/L   CHF (congestive heart failure) (HCC)    Chronic kidney disease    nephrolithiasis   Diabetes mellitus    TYPE 2   GERD (gastroesophageal reflux disease)    Hernia    Hypercholesterolemia    Hypertension    Incontinence    hx  over 1 year,leaks without awareness   Other and unspecified diseases of appendix    PAF (paroxysmal atrial fibrillation) ([place]    in the setting of ischemia on 03/31/2015   Pancreatitis    Prostate CA ([place] 03/01/04   prostate bx=Adenocarcinoam,gleason 3+4=7,PSA=6.75   Shortness of breath dyspnea    Ulcer    hx gastric   Past Surgical History:  Procedure Laterality Date   APPENDECTOMY     BACK SURGERY     CARDIAC  CATHETERIZATION N/A 04/01/2015   Procedure: Left Heart Cath and Cors/Grafts Angiography;  Surgeon: JJettie Booze MD;  [facility];  Service: Cardiovascular;  Laterality: N/A;   CARDIAC CATHETERIZATION N/A 04/01/2015   Procedure: Coronary Stent Intervention;  Surgeon: JJettie Booze MD;  [facility];  Service: Cardiovascular;  Laterality: N/A;   CARDIOVERSION N/A 11/01/2019   Procedure: CARDIOVERSION;  Surgeon: OGeralynn Rile MD;  [facility]  Service: Cardiovascular;  Laterality: N/A;   CARPAL TUNNEL RELEASE  2004   both hands   CORONARY ARTERY BYPASS GRAFT     CORONARY BALLOON ANGIOPLASTY N/A 08/19/2021   Procedure: CORONARY BALLOON ANGIOPLASTY;  Surgeon: MBurnell Blanks MD;  Location:  [facility];  Service: Cardiovascular;  Laterality: N/A;   CORONARY STENT INTERVENTION N/A 09/23/2020   Procedure: CORONARY STENT INTERVENTION;  Surgeon: Sherren Mocha, MD;  Location: [facility];  Service: Cardiovascular;  Laterality: N/A;   CYSTOSCOPY  08/23/12   incomplete emoptying bladder   HERNIA REPAIR     incision and drainage of right chest abscess     INTRAOPERATIVE TRANSTHORACIC ECHOCARDIOGRAM Left 04/28/2021   Procedure: INTRAOPERATIVE TRANSTHORACIC ECHOCARDIOGRAM;  Surgeon: Burnell Blanks, MD;  Location: [facility];  Service: Open Heart Surgery;  Laterality: Left;   LAPAROSCOPIC CHOLECYSTECTOMY  09/2009   LEFT HEART CATH AND CORS/GRAFTS ANGIOGRAPHY N/A 08/19/2021   Procedure: LEFT HEART CATH AND CORS/GRAFTS ANGIOGRAPHY;  Surgeon: Burnell Blanks, MD;  Location: [place];  Service: Cardiovascular;  Laterality: N/A;   LEFT HEART CATHETERIZATION WITH CORONARY ANGIOGRAM N/A 07/19/2014   Procedure: LEFT HEART CATHETERIZATION WITH CORONARY ANGIOGRAM;  Surgeon: Leonie Man, MD;  Location: [facility];  Service: Cardiovascular;  Laterality: N/A;   RECTAL SURGERY     RIGHT/LEFT HEART CATH AND  CORONARY/GRAFT ANGIOGRAPHY N/A 09/23/2020   Procedure: RIGHT/LEFT HEART CATH AND CORONARY/GRAFT ANGIOGRAPHY;  Surgeon: Sherren Mocha, MD;  Location: [place] [facility];  Service: Cardiovascular;  Laterality: N/A;   ROBOT ASSISTED LAPAROSCOPIC RADICAL PROSTATECTOMY  2005   TRANSCATHETER AORTIC VALVE REPLACEMENT, TRANSFEMORAL Bilateral 04/28/2021   Procedure: TRANSCATHETER AORTIC VALVE REPLACEMENT, TRANSFEMORAL;  Surgeon: Burnell Blanks, MD;  Location: [place];  Service: Open Heart Surgery;  Laterality: Bilateral;   ULTRASOUND GUIDANCE FOR VASCULAR ACCESS Bilateral 04/28/2021   Procedure: ULTRASOUND GUIDANCE FOR VASCULAR ACCESS;  Surgeon: Burnell Blanks, MD;  Location: [facility];  Service: Open Heart Surgery;  Laterality: Bilateral;   Family History  Problem Relation Age of Onset   Cancer Mother        stomach   Heart attack Father    Social History   Socioeconomic History   Marital status: Married    Spouse name: Not on file   Number of children: 2   Years of education: Not on file   Highest education level: Not on file  Occupational History    Employer: RETIRED    Comment: Retired  Tobacco Use   Smoking status: Former    Packs/day: 0.50    Years: 5.00    Total pack years: 2.50    Types: Cigarettes    Quit date: 07/20/1972    Years since quitting: 49.7   Smokeless tobacco: Never   Tobacco comments:    quit s  Vaping Use   Vaping Use: Never used  Substance and Sexual Activity   Alcohol use: No   Drug use: No    Comment: quit smoking 40 years ago   Sexual activity: Not Currently  Other Topics Concern   Not on file  Social History Narrative   Retired   Married      Social Determinants of Health   Financial Resource Strain: [place]  (03/29/2022)   Overall Financial Resource Strain (CARDIA)    Difficulty of Paying Living Expenses: Not hard at all  Food Insecurity: No Food Insecurity (03/29/2022)   Hunger Vital Sign    Worried About Running Out of Food in  the Last Year: Never true    [place] in the Last Year: Never true  Transportation Needs: No Transportation Needs (03/29/2022)   PRAPARE - Transportation    Lack of Transportation (Medical): No    Lack  of Transportation (Non-Medical): No  Recent Concern: Transportation Needs - Unmet Transportation Needs (02/12/2022)   PRAPARE - Transportation    Lack of Transportation (Medical): Yes    Lack of Transportation (Non-Medical): Yes  Physical Activity: Inactive (03/29/2022)   Exercise Vital Sign    Days of Exercise per Week: 0 days    Minutes of Exercise per Session: 0 min  Stress: No Stress Concern Present (03/29/2022)   [place]    Feeling of Stress : Not at all  Social Connections: [place] (03/02/2021)   Social Connection and Isolation Panel [NHANES]    Frequency of Communication with Friends and Family: More than three times a week    Frequency of Social Gatherings with Friends and Family: More than three times a week    Attends Religious Services: More than 4 times per year    Active Member of [employer] or Organizations: Yes    Attends Archivist Meetings: 1 to 4 times per year    Marital Status: Married    Tobacco Counseling Counseling given: Not Answered Tobacco comments: quit s   Clinical Intake:  Pre-visit preparation completed: Yes  Pain : No/denies pain     Nutritional Status: BMI of 19-24  Normal Nutritional Risks: None Diabetes: Yes  How often do you need to have someone help you when you read instructions, pamphlets, or other written materials from your doctor or pharmacy?: 1 - Never  Diabetic? Yes Nutrition Risk Assessment:  Has the patient had any N/V/D within the last 2 months?  No  Does the patient have any non-healing wounds?  No  Has the patient had any unintentional weight loss or weight gain?  No   Diabetes:  Is the patient diabetic?  Yes  If diabetic, was a  CBG obtained today?  No  Did the patient bring in their glucometer from home?  No  How often do you monitor your CBG's? daily.   Financial Strains and Diabetes Management:  Are you having any financial strains with the device, your supplies or your medication? No .  Does the patient want to be seen by Chronic Care Management for management of their diabetes?  No  Would the patient like to be referred to a Nutritionist or for Diabetic Management?  No   Diabetic Exams:  Diabetic Eye Exam: Overdue for diabetic eye exam. Pt has been advised about the importance in completing this exam. Patient advised to call and schedule an eye exam. Diabetic Foot Exam: Overdue, Pt has been advised about the importance in completing this exam. Pt is scheduled for diabetic foot exam on next appointment.   Interpreter Needed?: No  Information entered by :: NAllen LPN   Activities of Daily Living    03/29/2022   10:15 AM 08/19/2021    3:00 PM  In your present state of health, do you have any difficulty performing the following activities:  Hearing? 1 0  Vision? 0 0  Difficulty concentrating or making decisions? 1 0  Walking or climbing stairs? 1 0  Dressing or bathing? 0 0  Doing errands, shopping? 1 0  Preparing Food and eating ? Y   Using the Toilet? N   In the past six months, have you accidently leaked urine? Y   Do you have problems with loss of bowel control? N   Managing your Medications? Y   Managing your Finances? N   Housekeeping or managing your Housekeeping? N  Patient Care Team: [name], MD as PCP - General (Family Medicine) [name], MD as PCP - Cardiology (Cardiology) [place], Tami Lin, [place] as Physician Assistant (Cardiology) Dimitri Ped, RN as Case Manager  Indicate any recent Medical Services you may have received from other than [facility] providers in the past year (date may be approximate).     Assessment:   This is a routine wellness examination  for [facility].  Hearing/Vision screen Vision Screening - Comments:: [facility] exams, [facility]  Dietary issues and exercise activities discussed: Current Exercise Habits: The patient does not participate in regular exercise at present   Goals Addressed             This Visit's Progress    Patient Stated       03/29/2022, no goals       Depression Screen    03/29/2022   10:15 AM 02/10/2022   11:06 AM 10/28/2021    9:40 AM 09/09/2021   10:02 AM 03/02/2021    8:25 AM 03/02/2021    8:22 AM 10/15/2019    8:56 AM  PHQ 2/9 Scores  PHQ - 2 Score 0 2 0 4 0 0 0  PHQ- 9 Score  10  22   0    Fall Risk    03/29/2022   10:13 AM 02/12/2022    9:52 AM 10/28/2021    9:40 AM 10/21/2021    8:39 AM 09/09/2021   10:02 AM  Fall Risk   Falls in the past year? _0 0  Comment tripped      Number falls in past yr: 1 0 0 1   Injury with Fall? 1 0 1 1   Comment hurt shoulder      Risk for fall due to : Medication side effect;Impaired balance/gait   Impaired mobility   Follow up Falls evaluation completed;Education provided;Falls prevention discussed   Education provided;Falls evaluation completed;Falls prevention discussed     FALL RISK PREVENTION PERTAINING TO THE HOME:  Any stairs in or around the home? Yes  If so, are there any without handrails? No  Home free of loose throw rugs in walkways, pet beds, electrical cords, etc? Yes  Adequate lighting in your home to reduce risk of falls? Yes   ASSISTIVE DEVICES UTILIZED TO PREVENT FALLS:  Life alert? No  Use of a cane, walker or w/c? Yes  Grab bars in the bathroom? No  Shower chair or bench in shower? No  Elevated toilet seat or a handicapped toilet? Yes   TIMED UP AND GO:  Was the test performed? No .      Cognitive Function:    06/18/2016   11:49 AM  MMSE - Mini Mental State Exam  Orientation to time 4  Orientation to Place 5  Registration 3  Attention/ Calculation 1  Recall 3  Language- name 2 objects 2  Language-  repeat 1  Language- follow 3 step command 3  Language- read & follow direction 1  Write a sentence 1  Copy design 1  Total score 25        Immunizations Immunization History  Administered Date(s) Administered   Fluad Quad(high Dose 65+) 07/29/2020, 08/20/2021   Influenza Split 10/04/2011   Influenza Whole 07/16/2009   Influenza, High Dose Seasonal PF 06/18/2016, 08/19/2017, 09/13/2018, 07/19/2019   Influenza,inj,Quad PF,6+ Mos 08/13/2014   Influenza-Unspecified 08/13/2017   PFIZER(Purple Top)SARS-COV-2 Vaccination 11/24/2019, 12/15/2019   Pneumococcal Conjugate-13 05/31/2008, 06/03/2014   Pneumococcal Polysaccharide-23  11/17/2006   Td 11/23/2007, 10/18/2012   Zoster, Live 10/18/2013    TDAP status: Up to date  Flu Vaccine status: Up to date  Pneumococcal vaccine status: Up to date  Covid-19 vaccine status: Completed vaccines  Qualifies for Shingles Vaccine? Yes   Zostavax completed Yes   Shingrix Completed?: No.    Education has been provided regarding the importance of this vaccine. Patient has been advised to call insurance company to determine out of pocket expense if they have not yet received this vaccine. Advised may also receive vaccine at local pharmacy or Health Dept. Verbalized acceptance and understanding.  Screening Tests Health Maintenance  Topic Date Due   Zoster Vaccines- Shingrix (1 of 2) Never done   FOOT EXAM  02/08/2018   COVID-19 Vaccine (3 - Pfizer risk series) 01/12/2020   URINE MICROALBUMIN  07/29/2021   OPHTHALMOLOGY EXAM  10/22/2021   HEMOGLOBIN A1C  05/11/2022   INFLUENZA VACCINE  05/18/2022   TETANUS/TDAP  10/18/2022   Pneumonia Vaccine 38+ Years old  Completed   HPV VACCINES  Aged Out    Health Maintenance  Health Maintenance Due  Topic Date Due   Zoster Vaccines- Shingrix (1 of 2) Never done   FOOT EXAM  02/08/2018   COVID-19 Vaccine (3 - Pfizer risk series) 01/12/2020   URINE MICROALBUMIN  07/29/2021   OPHTHALMOLOGY EXAM   10/22/2021    Colorectal cancer screening: No longer required.   Lung Cancer Screening: (Low Dose CT Chest recommended if Age 26-80 years, 30 pack-year currently smoking OR have quit w/in 15years.) does not qualify.   Lung Cancer Screening Referral: no  Additional Screening:  Hepatitis C Screening: does not qualify;   Vision Screening: Recommended annual ophthalmology exams for early detection of glaucoma and other disorders of the eye. Is the patient up to date with their annual eye exam?  No  Who is the provider or what is the name of the office in which the patient attends annual eye exams? [facility] If pt is not established with a provider, would they like to be referred to a provider to establish care? No .   Dental Screening: Recommended annual dental exams for proper oral hygiene  Community Resource Referral / Chronic Care Management: CRR required this visit?  No   CCM required this visit?  No      Plan:     I have personally reviewed and noted the following in the patient's chart:   Medical and social history Use of alcohol, tobacco or illicit drugs  Current medications and supplements including opioid prescriptions. Patient is not currently taking opioid prescriptions. Functional ability and status Nutritional status Physical activity Advanced directives List of other physicians Hospitalizations, surgeries, and ER visits in previous 12 months Vitals Screenings to include cognitive, depression, and falls Referrals and appointments  In addition, I have reviewed and discussed with patient certain preventive protocols, quality metrics, and best practice recommendations. A written personalized care plan for preventive services as well as general preventive health recommendations were provided to patient.     Kellie Simmering, LPN   [id number]   Nurse Notes: refused 6 CIT  Due to this being a virtual visit, the after visit summary with patients personalized plan was  offered to patient via mail or my-chart.  Patient preferred to pick up at office at next visit

## 2022-03-29 NOTE — Patient Instructions (Addendum)
Visit Information  Thank you for taking time to visit with me today. Please don't hesitate to contact me if I can be of assistance to you before our next scheduled telephone appointment.  Following are the goals we discussed today:  Take all medications as prescribed Attend all scheduled provider appointments Call pharmacy for medication refills 3-7 days in advance of running out of medications Call provider office for new concerns or questions  call office if I gain more than 2 pounds in one day or 5 pounds in one week keep legs up while sitting track weight in diary watch for swelling in feet, ankles and legs every day weigh myself daily follow rescue plan if symptoms flare-up check blood sugar at prescribed times: once daily and when you have symptoms of low or high blood sugar check feet daily for cuts, sores or redness take the blood sugar log to all doctor visits fill half of plate with vegetables manage portion size  Our next appointment is by telephone on 04/29/22 at 11:30 AM  Please call the care guide team at [phone] if you need to cancel or reschedule your appointment.   If you are experiencing a Mental Health or [place] or need someone to talk to, please call the Suicide and Crisis Lifeline: 988 call the [place] National Suicide Prevention Lifeline: 725 164 7537 or TTY: (970)603-2169 TTY 989-598-4374) to talk to a trained counselor call 1-800-273-TALK (toll free, 24 hour hotline) go to [facility] [address] [place] [phone]) call 911   The patient verbalized understanding of instructions, educational materials, and care plan provided today and agreed to receive a mailed copy of patient instructions, educational materials, and care plan.   Telephone follow up appointment with care management team member scheduled for: 04/29/22 at 11:30 AM  Peter Garter RN, BSN,CCM, CDE Care Management  Coordinator [facility] [phone]    Living With Diabetes Diabetes (type 1 diabetes mellitus or type 2 diabetes mellitus) is a condition in which the body does not have enough of a hormone called insulin, or the body does not respond properly to insulin. Normally, insulin allows sugars (glucose) to enter cells in the body. With diabetes, extra glucose builds up in the blood instead of going into cells. This results in high blood glucose (hyperglycemia). How to manage lifestyle changes Managing diabetes includes medical treatments as well as lifestyle changes. If diabetes is not managed well, serious physical and emotional complications can occur. Taking good care of yourself means that you are responsible for: Monitoring glucose regularly. Eating a healthy diet. Exercising regularly. Meeting with health care providers. Taking medicines as directed. Most people feel some stress about managing their diabetes. When this stress becomes too much, it is known as diabetes-related distress. This is very common. Living with diabetes can place you at risk for diabetes distress, depression, or anxiety. These disorders can make diabetes more difficult to manage. How to recognize stress You may have diabetes distress if you: Avoid or ignore your daily diabetes care. This includes glucose testing, following a meal plan, and taking medications. Feel overwhelmed by your daily diabetes care. Experience emotional reactions such as anger, sadness, or fear related to your daily diabetes care. Feel fear or shame about not doing everything perfectly that you have been told to do. Emotional distress Symptoms of diabetes distress include: Anger about having a diagnosis of diabetes. Fear or frustration about your diagnosis and the changes you need to make to manage the  condition. Being overly worried about the care that you need or the cost of the care that you need. Feeling like you caused  your condition by doing something wrong. Fear about unpredictable fluctuations in your blood glucose, like low or high blood glucose. Feeling judged by your health care providers. Feeling very alone with the disease. Depression Having diabetes means that you are at a higher risk for depression. Your health care provider may test (screen) you for symptoms of depression. It is important to recognize symptoms and to start treatment for depression soon after it is diagnosed. The following are some symptoms of depression: Loss of interest in things that you used to enjoy. Feeling depressed much or most of the time. A change in appetite. Trouble getting to sleep or staying asleep. Feeling tired most of the day. Feeling nervous and anxious. Feeling guilty and worrying that you are a burden to others. Having thoughts of hurting yourself or feeling that you want to die. If you have any of these symptoms, more days than not, for 2 weeks or longer, you may have depression. This would be a good time to contact your health care provider. Follow these instructions at home: Managing diabetes distress The following are some ways to manage emotional distress: Learn as much as you can about diabetes and its treatment. Take one step at a time to improve your management. Meet with a certified diabetes care and education specialist. Take a class to learn how to manage your condition. Consider working with a counselor or therapist. Keep a journal of your thoughts and concerns. Accept that some things are out of your control. Talk with other people who have diabetes. It can help to talk about the distress that you feel. Find ways to manage stress that work for you. These may include art or music therapy, exercise, meditation, and hobbies. Seek support from spiritual leaders, family, and friends.  General instructions Do your best to follow your diabetes management plan. If you are struggling to follow your  plan, talk with a certified diabetes care and education specialist, or with someone else who has diabetes. They may have ideas that will help. Forgive yourself for not being perfect. Almost everyone struggles with the tasks of diabetes. Keep all follow-up visits. This is important. Where to find support Search for information and support from the American Diabetes Association: www.diabetes.org Find a certified diabetes education and care specialist. Make an appointment through the Association of Diabetes Care & Education Specialists: www.diabeteseducator.org Contact a health care provider if: You believe your diabetes is getting out of control. You are concerned you may be depressed. You think your medications are not helping control your diabetes. You are feeling overwhelmed with your diabetes. Get help right away if: You have thoughts about hurting yourself or others. If you ever feel like you may hurt yourself or others, or have thoughts about taking your own life, get help right away. You can go to your nearest emergency department or call: Your local emergency services (911 in the [place] A suicide crisis helpline, such as the [facility] at 905 552 4474 or 988 in the [place]. This is open 24 hours a day. Summary Diabetes (type 1 diabetes mellitus or type 2 diabetes mellitus) is a condition in which the body does not have enough of a hormone called insulin, or the body does not respond properly to insulin. Living with diabetes puts you at risk for medical and emotional issues, such as diabetes distress, depression, and  anxiety. Recognizing the symptoms of diabetes distress and depression may help you avoid problems with your diabetes control. If you experience symptoms, it is important to discuss this with your health care provider, certified diabetes care and education specialist, or therapist. It is important to start treatment for diabetes distress and depression  soon after diagnosis. Ask your health care provider to recommend a therapist who understands both depression and diabetes. This information is not intended to replace advice given to you by your health care provider. Make sure you discuss any questions you have with your health care provider. Document Revised: 04/29/2021 Document Reviewed: 02/14/2020 Elsevier Patient Education  [place].

## 2022-03-29 NOTE — Patient Instructions (Signed)
[name] , Thank you for taking time to come for your Medicare Wellness Visit. I appreciate your ongoing commitment to your health goals. Please review the following plan we discussed and let me know if I can assist you in the future.   Screening recommendations/referrals: Colonoscopy: not required Recommended yearly ophthalmology/optometry visit for glaucoma screening and checkup Recommended yearly dental visit for hygiene and checkup  Vaccinations: Influenza vaccine: due 05/18/2022 Pneumococcal vaccine: completed 06/03/2014 Tdap vaccine: completed 10/18/2012, due 10/18/2022 Shingles vaccine: discussed   Covid-19:  12/15/2019, 11/24/2019  Advanced directives: Advance directive discussed with you today.   Conditions/risks identified: none  Next appointment: Follow up in one year for your annual wellness visit.   Preventive Care 58 Years and Older, Male Preventive care refers to lifestyle choices and visits with your health care provider that can promote health and wellness. What does preventive care include? A yearly physical exam. This is also called an annual well check. Dental exams once or twice a year. Routine eye exams. Ask your health care provider how often you should have your eyes checked. Personal lifestyle choices, including: Daily care of your teeth and gums. Regular physical activity. Eating a healthy diet. Avoiding tobacco and drug use. Limiting alcohol use. Practicing safe sex. Taking low doses of aspirin every day. Taking vitamin and mineral supplements as recommended by your health care provider. What happens during an annual well check? The services and screenings done by your health care provider during your annual well check will depend on your age, overall health, lifestyle risk factors, and family history of disease. Counseling  Your health care provider may ask you questions about your: Alcohol use. Tobacco use. Drug use. Emotional well-being. Home and  relationship well-being. Sexual activity. Eating habits. History of falls. Memory and ability to understand (cognition). Work and work Statistician. Screening  You may have the following tests or measurements: Height, weight, and BMI. Blood pressure. Lipid and cholesterol levels. These may be checked every 5 years, or more frequently if you are over 46 years old. Skin check. Lung cancer screening. You may have this screening every year starting at age 68 if you have a 30-pack-year history of smoking and currently smoke or have quit within the past 15 years. Fecal occult blood test (FOBT) of the stool. You may have this test every year starting at age 51. Flexible sigmoidoscopy or colonoscopy. You may have a sigmoidoscopy every 5 years or a colonoscopy every 10 years starting at age 46. Prostate cancer screening. Recommendations will vary depending on your family history and other risks. Hepatitis C blood test. Hepatitis B blood test. Sexually transmitted disease (STD) testing. Diabetes screening. This is done by checking your blood sugar (glucose) after you have not eaten for a while (fasting). You may have this done every 1-3 years. Abdominal aortic aneurysm (AAA) screening. You may need this if you are a current or former smoker. Osteoporosis. You may be screened starting at age 58 if you are at high risk. Talk with your health care provider about your test results, treatment options, and if necessary, the need for more tests. Vaccines  Your health care provider may recommend certain vaccines, such as: Influenza vaccine. This is recommended every year. Tetanus, diphtheria, and acellular pertussis (Tdap, Td) vaccine. You may need a Td booster every 10 years. Zoster vaccine. You may need this after age 13. Pneumococcal 13-valent conjugate (PCV13) vaccine. One dose is recommended after age 48. Pneumococcal polysaccharide (PPSV23) vaccine. One dose is recommended after  age 2. Talk to your  health care provider about which screenings and vaccines you need and how often you need them. This information is not intended to replace advice given to you by your health care provider. Make sure you discuss any questions you have with your health care provider. Document Released: 10/31/2015 Document Revised: 06/23/2016 Document Reviewed: 08/05/2015 Elsevier Interactive Patient Education  2017 [place] Prevention in the Home Falls can cause injuries. They can happen to people of all ages. There are many things you can do to make your home safe and to help prevent falls. What can I do on the outside of my home? Regularly fix the edges of walkways and driveways and fix any cracks. Remove anything that might make you trip as you walk through a door, such as a raised step or threshold. Trim any bushes or trees on the path to your home. Use bright outdoor lighting. Clear any walking paths of anything that might make someone trip, such as rocks or tools. Regularly check to see if handrails are loose or broken. Make sure that both sides of any steps have handrails. Any raised decks and porches should have guardrails on the edges. Have any leaves, snow, or ice cleared regularly. Use sand or salt on walking paths during winter. Clean up any spills in your garage right away. This includes oil or grease spills. What can I do in the bathroom? Use night lights. Install grab bars by the toilet and in the tub and shower. Do not use towel bars as grab bars. Use non-skid mats or decals in the tub or shower. If you need to sit down in the shower, use a plastic, non-slip stool. Keep the floor dry. Clean up any water that spills on the floor as soon as it happens. Remove soap buildup in the tub or shower regularly. Attach bath mats securely with double-sided non-slip rug tape. Do not have throw rugs and other things on the floor that can make you trip. What can I do in the bedroom? Use night  lights. Make sure that you have a light by your bed that is easy to reach. Do not use any sheets or blankets that are too big for your bed. They should not hang down onto the floor. Have a firm chair that has side arms. You can use this for support while you get dressed. Do not have throw rugs and other things on the floor that can make you trip. What can I do in the kitchen? Clean up any spills right away. Avoid walking on wet floors. Keep items that you use a lot in easy-to-reach places. If you need to reach something above you, use a strong step stool that has a grab bar. Keep electrical cords out of the way. Do not use floor polish or wax that makes floors slippery. If you must use wax, use non-skid floor wax. Do not have throw rugs and other things on the floor that can make you trip. What can I do with my stairs? Do not leave any items on the stairs. Make sure that there are handrails on both sides of the stairs and use them. Fix handrails that are broken or loose. Make sure that handrails are as long as the stairways. Check any carpeting to make sure that it is firmly attached to the stairs. Fix any carpet that is loose or worn. Avoid having throw rugs at the top or bottom of the stairs. If you do  have throw rugs, attach them to the floor with carpet tape. Make sure that you have a light switch at the top of the stairs and the bottom of the stairs. If you do not have them, ask someone to add them for you. What else can I do to help prevent falls? Wear shoes that: Do not have high heels. Have rubber bottoms. Are comfortable and fit you well. Are closed at the toe. Do not wear sandals. If you use a stepladder: Make sure that it is fully opened. Do not climb a closed stepladder. Make sure that both sides of the stepladder are locked into place. Ask someone to hold it for you, if possible. Clearly mark and make sure that you can see: Any grab bars or handrails. First and last  steps. Where the edge of each step is. Use tools that help you move around (mobility aids) if they are needed. These include: Canes. Walkers. Scooters. Crutches. Turn on the lights when you go into a dark area. Replace any light bulbs as soon as they burn out. Set up your furniture so you have a clear path. Avoid moving your furniture around. If any of your floors are uneven, fix them. If there are any pets around you, be aware of where they are. Review your medicines with your doctor. Some medicines can make you feel dizzy. This can increase your chance of falling. Ask your doctor what other things that you can do to help prevent falls. This information is not intended to replace advice given to you by your health care provider. Make sure you discuss any questions you have with your health care provider. Document Released: 07/31/2009 Document Revised: 03/11/2016 Document Reviewed: 11/08/2014 Elsevier Interactive Patient Education  2017 [employer].

## 2022-03-29 NOTE — Chronic Care Management (AMB) (Signed)
Care Management    RN Visit Note  03/29/2022 Name: [name] MRN: [id number] DOB: [birth year]  Subjective: [name] is a 88 y.o. year old male who is a primary care patient of Burchette, Alinda Sierras, MD. The care management team was consulted for assistance with disease management and care coordination needs.    Engaged with patient by telephone for follow up visit in response to provider referral for case management and/or care coordination services.   Consent to Services:   [name] was given information about Care Management services today including:  Care Management services includes personalized support from designated clinical staff supervised by his physician, including individualized plan of care and coordination with other care providers 24/7 contact phone numbers for assistance for urgent and routine care needs. The patient may stop case management services at any time by phone call to the office staff.  Patient agreed to services and consent obtained.   Assessment: Review of patient past medical history, allergies, medications, health status, including review of consultants reports, laboratory and other test data, was performed as part of comprehensive evaluation and provision of chronic care management services.   SDOH (Social Determinants of Health) assessments and interventions performed:    Care Plan  Allergies  Allergen Reactions   Codeine Other (See Comments)    Makes him feel goofy   Morphine Other (See Comments)    Nightmares and felt bad    Outpatient Encounter Medications as of 03/29/2022  Medication Sig   albuterol (VENTOLIN HFA) 108 (90 Base) MCG/ACT inhaler Inhale 2 puffs into the lungs every 6 (six) hours as needed for wheezing or shortness of breath.   amLODipine (NORVASC) 5 MG tablet TAKE 1 TABLET BY MOUTH EVERY DAY   BD PEN NEEDLE NANO 2ND GEN 32G X 4 MM MISC USE AS DIRECTED.   blood glucose meter kit and supplies KIT Dispense based on patient and  insurance preference. Use up to four times daily as directed. (FOR ICD-9 250.00, 250.01).   budesonide-formoterol (SYMBICORT) 160-4.5 MCG/ACT inhaler Inhale 2 puffs into the lungs in the morning and at bedtime. (Patient taking differently: Inhale 2 puffs into the lungs 2 (two) times daily as needed (sob/wheezing).)   Carboxymethylcellul-Glycerin (LUBRICATING EYE DROPS OP) Place 1 drop into both eyes daily as needed (dry eyes).   carvedilol (COREG) 6.25 MG tablet TAKE 1 TABLET (6.25 MG TOTAL) BY MOUTH 2 (TWO) TIMES DAILY WITH A MEAL.   clopidogrel (PLAVIX) 75 MG tablet Take 1 tablet (75 mg total) by mouth daily with breakfast.   Cyanocobalamin (VITAMIN B-12 PO) Take 1 tablet by mouth daily.   docusate sodium (STOOL SOFTENER) 100 MG capsule Take 1 capsule (100 mg total) by mouth 2 (two) times daily as needed for mild constipation.   ELIQUIS 2.5 MG TABS tablet TAKE 1 TABLET BY MOUTH TWICE A DAY   FLUoxetine (PROZAC) 10 MG capsule Take by mouth daily.   Homeopathic Products [facility] RELIEF EX) Apply 1 application topically daily as needed (knee pain).   hydrocortisone 2.5 % cream Apply 1 application topically daily as needed (facial breakouts).   Insulin Degludec (TRESIBA) 100 UNIT/ML SOLN Inject 10 Units into the skin daily.   JANUVIA 50 MG tablet TAKE 1 TABLET BY MOUTH EVERY DAY   Multiple Vitamins-Minerals (ICAPS AREDS 2 PO) Take 1 capsule by mouth daily.   nitroGLYCERIN (NITROSTAT) 0.4 MG SL tablet PLACE 1 TABLET UNDER THE TONGUE EVERY 5 MINUTES X 3 DOSES AS NEEDED FOR CHEST PAIN  pantoprazole (PROTONIX) 40 MG tablet TAKE 1 TABLET BY MOUTH EVERY DAY   potassium chloride (KLOR-CON 10) 10 MEQ tablet Take 1 tablet (10 mEq total) by mouth daily.   rosuvastatin (CRESTOR) 20 MG tablet TAKE 1 TABLET BY MOUTH EVERY DAY   spironolactone (ALDACTONE) 25 MG tablet TAKE 1 TABLET (25 MG TOTAL) BY MOUTH DAILY.   torsemide (DEMADEX) 20 MG tablet Take 20 mg by mouth 2 (two) times daily. 1 tablet in the morning  and 1 tablet in the evening   TRESIBA FLEXTOUCH 100 UNIT/ML FlexTouch Pen INJECT 10 UNITS INTO THE SKIN DAILY   No facility-administered encounter medications on file as of 03/29/2022.    Patient Active Problem List   Diagnosis Date Noted   Stage 3b chronic kidney disease (CKD) ([place]) 02/06/2022   Type 2 diabetes mellitus with hyperlipidemia (HCC)    Anemia    Atrial fibrillation, chronic (HCC)    Acute on chronic systolic CHF (congestive heart failure) ([place]) 02/05/2022   Depression, recurrent ([place]) 09/30/2021   NSTEMI (non-ST elevated myocardial infarction) ([place])    S/P TAVR (transcatheter aortic valve replacement) 08/18/2021   Unstable angina ([place]) 08/18/2021   Severe aortic stenosis 04/29/2021   Aortic stenosis, severe 04/27/2021   Angina at rest [facility]) 09/20/2020   Chronic diastolic (congestive) heart failure ([place]) 09/20/2020   COPD with acute bronchitis ([place]) 09/08/2020   Acute exacerbation of CHF (congestive heart failure) ([place]) 09/07/2020   COPD mixed type ([place]) 76/80/8811   Chronic systolic HF (heart failure) ([place]) 12/30/9456   Systolic dysfunction with acute on chronic heart failure ([place]) 10/23/2019   Educated about COVID-19 virus infection 10/23/2019   Persistent atrial fibrillation ([place]) 08/29/2019   Nonrheumatic aortic valve stenosis 08/29/2019   Elevated troponin 07/27/2019   HLD (hyperlipidemia) 07/27/2019   GERD (gastroesophageal reflux disease) 07/27/2019   Congestive heart failure (CHF) ([place]) 07/26/2019   Pure hypercholesterolemia    Acute on chronic diastolic CHF (congestive heart failure) (HCC)    SOB (shortness of breath) 06/04/2019   CKD (chronic kidney disease), stage III ([place]) 06/04/2019   Coronary artery disease of native artery of native heart with stable angina pectoris ([place]) 03/24/2018   Bilateral carotid artery stenosis 07/23/2017   Palpitation 07/23/2017   Urinary urgency 07/28/2016   PAF (paroxysmal atrial fibrillation) (HCC)    DOE (dyspnea on  exertion) 07/17/2014   Acute on chronic renal insufficiency- ACE and diuretics held 59/29/2446   Diastolic dysfunction- moderate on echo 2013 07/17/2014   Aortic stenosis 07/17/2014   LBBB (left bundle branch block) 07/17/2014   Chest pain 07/17/2014   Crescendo angina ([place]) 07/16/2014   CKD (chronic kidney disease) stage 4, GFR 15-29 ml/min ([place]) 12/03/2013   Squamous cell cancer of skin of hand 05/31/2013   HEAD TRAUMA, CLOSED 09/24/2010   Ischemic cardiomyopathy- last EF Normal Myoview Jan 2015 04/02/2010   INSOMNIA 04/02/2010   Moderate bilateral ICA disease by doppler June 2015 01/08/2010   DYSPHAGIA UNSPECIFIED 09/05/2009   Hx GERD/PUD 09/03/2009   CHOLELITHIASIS, WITH CHOLECYSTITIS 09/03/2009   ARTHRITIS 07/16/2009   HYPOKALEMIA 11/06/2008   CAD in native artery 11/06/2008   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 ([place]) 11/23/2007   Dyslipidemia 11/23/2007   ROSACEA 11/23/2007   DYSPNEA ON EXERTION 11/23/2007   UNS ADVRS EFF OTH RX MEDICINAL&BIOLOGICAL SBSTNC 11/23/2007   Hypertension 11/22/2007   Hx of Prostate ca 03/01/2004    Conditions to be addressed/monitored: CHF, DMII, and falls  Care Plan : RN Care Manager  Plan of Care  Updates made by Dimitri Ped, RN since 03/29/2022 12:00 AM     Problem: Chronic disease management education and/or care coordination needs   Priority: High     Long-Range Goal: Development of Plan of Care for Chronic Disease Management and/or Care Coordination Needs   Start Date: 02/12/2022  Expected End Date: 09/25/2022  Priority: High  Note:    Current Barriers:  Knowledge Deficits related to plan of care for management of CHF and DMII  Chronic Disease Management support and education needs related to CHF and DMII  Hard of Hearing   Spoke with wife [name] Sozio due pt's request due to being hard of hearing.  States pt is getting stronger and he is no longer using his cane.  Denies any falls since  hospitalization.  States he is weighting daily and his weight was 143 this morning.  Denies any recent chest pains, swelling or increase in shortness of breath. States he has not been checking his B/P regularly but it his good when he does check. States he is following a low sodium diet and he is watching his sweets.  States he has been eating more fruit but not too much.  States he checks his CBG daily and it ranges 100-112.  Denies any hypoglycemia. States he likes to be active working outside in his garage and doing yard work   [employer] Clinical Goal(s):  Patient will verbalize understanding of plan for management of CHF, DMII, and falls as evidenced by voiced adherence to plan of care verbalize basic understanding of  CHF, DMII, and falls disease process and self health management plan as evidenced by voiced understanding and teach back  take all medications exactly as prescribed and will call provider for medication related questions as evidenced by dispense report and pt verbalization  attend all scheduled medical appointments: Dr. Elease Hashimoto 04/14/22, cardiology 04/13/22 as evidenced by medical records continue to work with RN Care Manager to address care management and care coordination needs related to  CHF, DMII, and falls as evidenced by adherence to CM Team Scheduled appointments through collaboration with RN Care manager, provider, and care team.   Interventions: 1:1 collaboration with primary care provider regarding development and update of comprehensive plan of care as evidenced by provider attestation and co-signature Inter-disciplinary care team collaboration (see longitudinal plan of care) Evaluation of current treatment plan related to  self management and patient's adherence to plan as established by provider   Heart Failure Interventions:  (Status:  Goal on track:  Yes.) Long Term Goal Basic overview and discussion of pathophysiology of Heart Failure reviewed Provided education on low  sodium diet Discussed importance of daily weight and advised patient to weigh and record daily Reviewed role of diuretics in prevention of fluid overload and management of heart failure; Reviewed importance of daily weight.  Reviewed heart failure zones and when to call provider  Diabetes Interventions:  (Status:  New goal.) Long Term Goal Assessed patient's understanding of A1c goal:  7.5% Provided education to patient about basic DM disease process Reviewed medications with patient and discussed importance of medication adherence Provided patient with written educational materials related to hypo and hyperglycemia and importance of correct treatment Advised patient, providing education and rationale, to check cbg daily and record, calling provider for findings outside established parameters Review of patient status, including review of consultants reports, relevant laboratory and other test results, and medications completed Lab Results  Component Value Date   HGBA1C 7.2 (H)  11/11/2021   Falls Interventions:  (Status:  Goal on track:  Yes.) Long Term Goal Reviewed medications and discussed potential side effects of medications such as dizziness and frequent urination Advised patient of importance of notifying provider of falls Assessed for falls since last encounter Assessed patients knowledge of fall risk prevention secondary to previously provided education Reviewed to use cane if starts to have dizziness   Patient Goals/Self-Care Activities: Take all medications as prescribed Attend all scheduled provider appointments Call pharmacy for medication refills 3-7 days in advance of running out of medications Call provider office for new concerns or questions  call office if I gain more than 2 pounds in one day or 5 pounds in one week keep legs up while sitting track weight in diary watch for swelling in feet, ankles and legs every day weigh myself daily follow rescue plan if symptoms  flare-up check blood sugar at prescribed times: once daily and when you have symptoms of low or high blood sugar check feet daily for cuts, sores or redness take the blood sugar log to all doctor visits fill half of plate with vegetables manage portion size  Follow Up Plan:  Telephone follow up appointment with care management team member scheduled for:  04/29/22 The patient has been provided with contact information for the care management team and has been advised to call with any health related questions or concerns.       Plan: Telephone follow up appointment with care management team member scheduled for:  04/29/22 The patient has been provided with contact information for the care management team and has been advised to call with any health related questions or concerns.   Peter Garter RN, Jackquline Denmark, CDE Care Management Coordinator [facility] [phone]

## 2022-03-30 DIAGNOSIS — R9721 Rising PSA following treatment for malignant neoplasm of prostate: Secondary | ICD-10-CM | POA: Diagnosis not present

## 2022-03-31 NOTE — Progress Notes (Unsigned)
Cardiology Office Note:    Date:  03/31/2022   ID:  [name], DOB [birth year], MRN [id number]  PCP:  [name], MD   [facility] Providers Cardiologist:  [name], MD Cardiology APP:  Ledora Bottcher, PA { Referring MD: [name], MD   No chief complaint on file. ***  History of Present Illness:    [name] is a 88 y.o. male with a hx of CAD s/p CABG and subsequent PCI, PAF, COPD, LBBB, CKD stage 3-4, DM, HTN, HLD, carotid artery disease, HFpEF, prostate cancer, and low flow low gradient AS. He underwent TAVR workup in 2021 with right and left heart catheterization that showed patent LIMA-LAD, SVG-left PLA, and SVG-PDA. He had severe in-stent restenosis of the SVG-OM treated with POBA. He was treated with triple therapy ASA, plavix, and eliquis x 1 month, then plavix and eliquis. He underwent TAVR, but has continued to have chronic chest pain.    Heart monitor showed 100% Afib with attempted DCCV that was unsuccessful in 2021.   He has a history of chronic systolic and diastolic heart failure. Echo 06/04/21 with LVEF 40-45% with LVH, mildly reduced RV fx, mild MR and good TAVR valve function. Repeat heart catheterization 08/2021 for chest pain with severe triple vessel disease with 4/4 patent grafts. Severe restenosis in the distal body of the SVG-OM1 previously stented and treated with PTCA.  He was seen in clinic by Dr. Percival Spanish 09/15/21 and reportedly was in a wheelchair, he is not able to swallow foods and reported what sounded like hemorrhoids. He reported right sided chest discomfort which has not changed with PCI or TAVR. To this end, he self-discontinued jardiance without improvement in his symptoms. Labs checked with hyponatremia and Dr. Percival Spanish instructed to reduce free water intake and reduce lasix to 20 mg daily for 1 week with repeat BMP, which showed improved Na. Unfortunately, he gained weight on the reduced lasix regimen. Dr. Elease Hashimoto felt  his prior weight loss may have been due to depression.   He presents today for follow up.     CAD s/p CABG with most recent PCI in 2022 - continue plavix and eliquis, no ASA - chronic right sided chest pain   AS s/p TAVR - stable on follow up echo, mild perivalvular leak - no change in therapy   Chronic combined systolic and diastolic heart failure - LVEF 40-45%, mild RV dysfunction - diruetic regimen complicated by hyponatremia, although low sodium may have been the result of poor PO intake - he has been taking ______   Persistent atrial fibrillation Chronic anticoagulation - no bleeding problems   Carotid artery disease - needs follow up dopplers in June 2023   Hypertension Continue amlodipine, coreg, spiro   Hyperlipidemia with LDL < 70 08/19/2021: Cholesterol 122; HDL 35; LDL Cholesterol 73; Triglycerides 68; VLDL 14   DM2 A1c has trended up to 8.6% as of 08/2021   CKD stage II-III sCr 1.56 on 10/21/21 - baseline          Past Medical History:  Diagnosis Date   Aortic stenosis, moderate 09/20/2020   Arthritis    CAD (coronary artery disease)    a. Cath September 2015 LIMA to the LAD patent, SVG to PDA patent, SVG to posterior lateral patent, SVG to OM with a 90% in-stent restenosis at an anastomotic lesion. This was treated with angioplasty. b. cath 04/01/2015 95% ISR in SVG to OM treated with 2.75x24 Synergy DES postdilated to  3.10m, all other grafts patent   Cataract    surgery,B/L   CHF (congestive heart failure) (HCC)    Chronic kidney disease    nephrolithiasis   Diabetes mellitus    TYPE 2   GERD (gastroesophageal reflux disease)    Hernia    Hypercholesterolemia    Hypertension    Incontinence    hx  over 1 year,leaks without awareness   Other and unspecified diseases of appendix    PAF ([place]oxysmal [place] fibrillation) ([place]    in the setting of ischemia on 03/31/2015   Pancreatitis    [place]   prostate  bx=Adenocarcinoam,gleason 3+4=7,PSA=6.75   Shortness of breath dyspnea    Ulcer    hx gastric    Past Surgical History:  Procedure Laterality Date   APPENDECTOMY     BACK SURGERY     CARDIAC CATHETERIZATION N/A 04/01/2015   Procedure: Left Heart Cath and Cors/Grafts Angiography;  Surgeon: JJettie Booze MD;  [place];  Service: Cardiovascular;  Laterality: N/A;   CARDIAC CATHETERIZATION N/A 04/01/2015   Procedure: Coronary Stent Intervention;  Surgeon: JJettie Booze MD;  [place];  Service: Cardiovascular;  Laterality: N/A;   CARDIOVERSION N/A 11/01/2019   Procedure: CARDIOVERSION;  Surgeon: OGeralynn Rile MD;  [place]  Service: Cardiovascular;  Laterality: N/A;   CARPAL TUNNEL RELEASE  2004   both hands   CORONARY ARTERY BYPASS GRAFT     CORONARY BALLOON ANGIOPLASTY N/A 08/19/2021   Procedure: CORONARY BALLOON ANGIOPLASTY;  Surgeon: MBurnell Blanks MD;  [place];  Service: Cardiovascular;  Laterality: N/A;   CORONARY STENT INTERVENTION N/A 09/23/2020   Procedure: CORONARY STENT INTERVENTION;  Surgeon: CSherren Mocha MD;  [place] [facility];  Service: Cardiovascular;  Laterality: N/A;   CYSTOSCOPY  08/23/12   incomplete emoptying bladder   HERNIA REPAIR     incision and drainage of right chest abscess     INTRAOPERATIVE TRANSTHORACIC ECHOCARDIOGRAM Left 04/28/2021   Procedure: INTRAOPERATIVE TRANSTHORACIC ECHOCARDIOGRAM;  Surgeon: MBurnell Blanks MD;  [place]  Service: Open Heart Surgery;  Laterality: Left;   LAPAROSCOPIC CHOLECYSTECTOMY  09/2009   LEFT HEART CATH AND CORS/GRAFTS ANGIOGRAPHY N/A 08/19/2021   Procedure: LEFT HEART CATH AND CORS/GRAFTS ANGIOGRAPHY;  Surgeon: MBurnell Blanks MD;  [place] [facility];  Service: Cardiovascular;  Laterality: N/A;   LEFT HEART CATHETERIZATION WITH CORONARY ANGIOGRAM N/A 07/19/2014   Procedure: LEFT  HEART CATHETERIZATION WITH CORONARY ANGIOGRAM;  Surgeon: DLeonie Man MD;  [facility];  Service: Cardiovascular;  Laterality: N/A;   RECTAL SURGERY     RIGHT/LEFT HEART CATH AND CORONARY/GRAFT ANGIOGRAPHY N/A 09/23/2020   Procedure: RIGHT/LEFT HEART CATH AND CORONARY/GRAFT ANGIOGRAPHY;  Surgeon: CSherren Mocha MD;  [place];  Service: Cardiovascular;  Laterality: N/A;   ROBOT ASSISTED LAPAROSCOPIC RADICAL PROSTATECTOMY  2005   TRANSCATHETER AORTIC VALVE REPLACEMENT, TRANSFEMORAL Bilateral 04/28/2021   Procedure: TRANSCATHETER AORTIC VALVE REPLACEMENT, TRANSFEMORAL;  Surgeon: MBurnell Blanks MD;  [place]  Service: Open Heart Surgery;  Laterality: Bilateral;   ULTRASOUND GUIDANCE FOR VASCULAR ACCESS Bilateral 04/28/2021   Procedure: ULTRASOUND GUIDANCE FOR VASCULAR ACCESS;  Surgeon: MBurnell Blanks MD;  [place]  Service: Open Heart Surgery;  Laterality: Bilateral;    Current Medications: No outpatient medications have been marked as taking for the 04/13/22 encounter (Appointment) with [place]edora Bottcher [place]  Allergies:   Codeine and Morphine   Social History   Socioeconomic History   Marital status: Married    Spouse name: Not on file   Number of children: 2   Years of education: Not on file   Highest education level: Not on file  Occupational History    Employer: RETIRED    Comment: Retired  Tobacco Use   Smoking status: Former    Packs/day: 0.50    Years: 5.00    Total pack years: 2.50    Types: Cigarettes    Quit date: 07/20/1972    Years since quitting: 49.7   Smokeless tobacco: Never   Tobacco comments:    quit s  Vaping Use   Vaping Use: Never used  Substance and Sexual Activity   Alcohol use: No   Drug use: No    Comment: quit smoking 40 years ago   Sexual activity: Not Currently  Other Topics Concern   Not on file  Social History Narrative   Retired   Married      Social Determinants of  Health   Financial Resource Strain: [place]  (03/29/2022)   Overall Financial Resource Strain (CARDIA)    Difficulty of Paying Living Expenses: Not hard at all  Food Insecurity: No Food Insecurity (03/29/2022)   Hunger Vital Sign    Worried About Running Out of Food in the Last Year: Never true    [place] in the Last Year: Never true  Transportation Needs: No Transportation Needs (03/29/2022)   PRAPARE - Hydrologist (Medical): No    Lack of Transportation (Non-Medical): No  Recent Concern: Transportation Needs - Unmet Transportation Needs (02/12/2022)   PRAPARE - Transportation    Lack of Transportation (Medical): Yes    Lack of Transportation (Non-Medical): Yes  Physical Activity: Inactive (03/29/2022)   Exercise Vital Sign    Days of Exercise per Week: 0 days    Minutes of Exercise per Session: 0 min  Stress: No Stress Concern Present (03/29/2022)   [place]    Feeling of Stress : Not at all  Social Connections: [place] (03/02/2021)   Social Connection and Isolation Panel [NHANES]    Frequency of Communication with Friends and Family: More than three times a week    Frequency of Social Gatherings with Friends and Family: More than three times a week    Attends Religious Services: More than 4 times per year    Active Member of [employer] or Organizations: Yes    Attends Archivist Meetings: 1 to 4 times per year    Marital Status: Married     Family History: The patient's ***family history includes Cancer in his mother; Heart attack in his father.  ROS:   Please see the history of present illness.    *** All other systems reviewed and are negative.  EKGs/Labs/Other Studies Reviewed:    The following studies were reviewed today: ***  EKG:  EKG is *** ordered today.  The ekg ordered today demonstrates ***  Recent Labs: 08/18/2021: TSH 2.079 02/01/2022: Pro  B Natriuretic peptide (BNP) 1,846.0 02/05/2022: B Natriuretic Peptide 1,573.0 02/06/2022: ALT 17 02/08/2022: Magnesium 2.0 02/15/2022: BUN 29; Creatinine, Ser 1.68; Potassium 4.1; Sodium 136 03/17/2022: Hemoglobin 12.7; Platelets 118.0  Recent Lipid Panel    Component Value Date/Time   CHOL 122 08/19/2021 0234   CHOL 97 (L) 01/01/2021 0848   TRIG 68 08/19/2021  0234   TRIG 306 (HH) 09/27/2006 1154   HDL 35 (L) 08/19/2021 0234   HDL 33 (L) 01/01/2021 0848   CHOLHDL 3.5 08/19/2021 0234   VLDL 14 08/19/2021 0234   LDLCALC 73 08/19/2021 0234   LDLCALC 46 01/01/2021 0848   LDLDIRECT 143.3 11/23/2007 0000     Risk Assessment/Calculations:   {Does this patient have ATRIAL FIBRILLATION?:[phone]}       Physical Exam:    VS:  There were no vitals taken for this visit.    Wt Readings from Last 3 Encounters:  03/29/22 145 lb (65.8 kg)  03/19/22 151 lb (68.5 kg)  03/17/22 150 lb 1.6 oz (68.1 kg)     GEN: *** Well nourished, well developed in no acute distress HEENT: Normal NECK: No JVD; No carotid bruits LYMPHATICS: No lymphadenopathy CARDIAC: ***RRR, no murmurs, rubs, gallops RESPIRATORY:  Clear to auscultation without rales, wheezing or rhonchi  ABDOMEN: Soft, non-tender, non-distended MUSCULOSKELETAL:  No edema; No deformity  SKIN: Warm and dry NEUROLOGIC:  Alert and oriented x 3 PSYCHIATRIC:  Normal affect   ASSESSMENT:    No diagnosis found. PLAN:    In order of problems listed above:  ***      {Are you ordering a CV Procedure (e.g. stress test, cath, DCCV, TEE, etc)?   Press F2        :[id number]}    Medication Adjustments/Labs and Tests Ordered: Current medicines are reviewed at length with the patient today.  Concerns regarding medicines are outlined above.  No orders of the defined types were placed in this encounter.  No orders of the defined types were placed in this encounter.   There are no Patient Instructions on file for this visit.    Signed, Ledora Bottcher, [place]  03/31/2022 3:07 PM    [facility]

## 2022-04-06 ENCOUNTER — Other Ambulatory Visit: Payer: Self-pay | Admitting: *Deleted

## 2022-04-06 DIAGNOSIS — I6523 Occlusion and stenosis of bilateral carotid arteries: Secondary | ICD-10-CM

## 2022-04-08 ENCOUNTER — Ambulatory Visit (HOSPITAL_COMMUNITY)
Admission: RE | Admit: 2022-04-08 | Discharge: 2022-04-08 | Disposition: A | Discharge status: Home / Self Care | Payer: Medicare PPO | Source: Ambulatory Visit | Attending: Cardiology | Admitting: Cardiology

## 2022-04-08 DIAGNOSIS — I6523 Occlusion and stenosis of bilateral carotid arteries: Secondary | ICD-10-CM

## 2022-04-09 DIAGNOSIS — R9721 Rising PSA following treatment for malignant neoplasm of prostate: Secondary | ICD-10-CM | POA: Diagnosis not present

## 2022-04-09 DIAGNOSIS — C61 Malignant neoplasm of prostate: Secondary | ICD-10-CM | POA: Diagnosis not present

## 2022-04-09 DIAGNOSIS — N5201 Erectile dysfunction due to arterial insufficiency: Secondary | ICD-10-CM | POA: Diagnosis not present

## 2022-04-12 ENCOUNTER — Encounter: Payer: Self-pay | Admitting: *Deleted

## 2022-04-13 ENCOUNTER — Encounter: Payer: Self-pay | Admitting: Physician Assistant

## 2022-04-13 ENCOUNTER — Ambulatory Visit: Payer: Medicare PPO | Admitting: Physician Assistant

## 2022-04-13 VITALS — BP 90/60 | HR 58 | Ht 69.0 in | Wt 151.6 lb

## 2022-04-13 DIAGNOSIS — R531 Weakness: Secondary | ICD-10-CM | POA: Diagnosis not present

## 2022-04-13 DIAGNOSIS — Z951 Presence of aortocoronary bypass graft: Secondary | ICD-10-CM | POA: Diagnosis not present

## 2022-04-13 DIAGNOSIS — I5022 Chronic systolic (congestive) heart failure: Secondary | ICD-10-CM | POA: Diagnosis not present

## 2022-04-13 DIAGNOSIS — I6523 Occlusion and stenosis of bilateral carotid arteries: Secondary | ICD-10-CM | POA: Diagnosis not present

## 2022-04-13 DIAGNOSIS — I10 Essential (primary) hypertension: Secondary | ICD-10-CM | POA: Diagnosis not present

## 2022-04-13 DIAGNOSIS — Z7901 Long term (current) use of anticoagulants: Secondary | ICD-10-CM

## 2022-04-13 DIAGNOSIS — I482 Chronic atrial fibrillation, unspecified: Secondary | ICD-10-CM

## 2022-04-13 DIAGNOSIS — N184 Chronic kidney disease, stage 4 (severe): Secondary | ICD-10-CM

## 2022-04-13 DIAGNOSIS — I2510 Atherosclerotic heart disease of native coronary artery without angina pectoris: Secondary | ICD-10-CM | POA: Diagnosis not present

## 2022-04-13 DIAGNOSIS — Z952 Presence of prosthetic heart valve: Secondary | ICD-10-CM

## 2022-04-13 DIAGNOSIS — E785 Hyperlipidemia, unspecified: Secondary | ICD-10-CM

## 2022-04-13 DIAGNOSIS — E1169 Type 2 diabetes mellitus with other specified complication: Secondary | ICD-10-CM

## 2022-04-14 ENCOUNTER — Ambulatory Visit: Payer: Medicare PPO | Admitting: Family Medicine

## 2022-04-19 ENCOUNTER — Other Ambulatory Visit: Payer: Self-pay | Admitting: Family Medicine

## 2022-04-19 DIAGNOSIS — E1121 Type 2 diabetes mellitus with diabetic nephropathy: Secondary | ICD-10-CM

## 2022-04-23 ENCOUNTER — Other Ambulatory Visit: Payer: Self-pay | Admitting: Family Medicine

## 2022-04-23 ENCOUNTER — Other Ambulatory Visit: Payer: Self-pay | Admitting: Physician Assistant

## 2022-04-23 NOTE — Telephone Encounter (Signed)
This is Dr. Hochrein's pt. °

## 2022-04-26 ENCOUNTER — Other Ambulatory Visit: Payer: Self-pay

## 2022-04-26 DIAGNOSIS — E785 Hyperlipidemia, unspecified: Secondary | ICD-10-CM

## 2022-04-26 DIAGNOSIS — K2211 Ulcer of esophagus with bleeding: Secondary | ICD-10-CM

## 2022-04-26 MED ORDER — PANTOPRAZOLE SODIUM 40 MG PO TBEC: 40.0000 mg | DELAYED_RELEASE_TABLET | Freq: Every day | ORAL | 0 refills | Status: DC

## 2022-04-26 MED ORDER — ROSUVASTATIN CALCIUM 20 MG PO TABS: 20.0000 mg | ORAL_TABLET | Freq: Every day | ORAL | 0 refills | Status: DC

## 2022-04-27 ENCOUNTER — Encounter: Payer: Self-pay | Admitting: Family Medicine

## 2022-04-27 ENCOUNTER — Ambulatory Visit: Payer: Medicare PPO | Admitting: Family Medicine

## 2022-04-27 VITALS — BP 106/60 | HR 56 | Temp 97.5°F | Ht 69.0 in | Wt 151.6 lb

## 2022-04-27 DIAGNOSIS — I5022 Chronic systolic (congestive) heart failure: Secondary | ICD-10-CM

## 2022-04-27 DIAGNOSIS — E1121 Type 2 diabetes mellitus with diabetic nephropathy: Secondary | ICD-10-CM | POA: Diagnosis not present

## 2022-04-27 DIAGNOSIS — R233 Spontaneous ecchymoses: Secondary | ICD-10-CM | POA: Diagnosis not present

## 2022-04-27 DIAGNOSIS — R5383 Other fatigue: Secondary | ICD-10-CM

## 2022-04-27 LAB — POCT GLYCOSYLATED HEMOGLOBIN (HGB A1C): Hemoglobin A1C: 6.3 % — AB (ref 4.0–5.6)

## 2022-04-27 NOTE — Progress Notes (Signed)
Established Patient Office Visit  Subjective   Patient ID: [name], male    DOB: 1933/06/06  Age: 88 y.o. MRN: [id number]  Chief Complaint  Patient presents with   Follow-up    HPI    He has history of chronic combined systolic and diastolic heart failure, aortic stenosis with previous TAVR, hypertension, type 2 diabetes, atrial fibrillation on chronic anticoagulation with Eliquis, chronic kidney disease.  Here for medical follow up.   Generally doing well.  He continues to spend much of his morning time outdoors doing light duties around the home.  He typically takes about a 2-hour nap in the afternoon.  He does have some ongoing chronic fatigue which is unchanged.  Respiratory status stable.  No recent peripheral edema.  Home weights have been very stable.  He remains on torsemide 20 mg twice daily.  History of volume overload multiple times in the past.  Chronic dyspnea with exertion unchanged.  Remains on Eliquis and Plavix.  Significant bruising on both forearms which is chronic.  Blood sugars have been very well controlled.  Fasting blood sugar this morning 104 and past 2 days 113 and 95.  Denies any recent hypoglycemic symptoms.  Last A1c was 7.2%.  This has been steadily improving.  Denies any recent falls.  No dizziness.  No chest pains.  Past Medical History:  Diagnosis Date   Aortic stenosis, moderate 09/20/2020   Arthritis    CAD (coronary artery disease)    a. Cath September 2015 LIMA to the LAD patent, SVG to PDA patent, SVG to posterior lateral patent, SVG to OM with a 90% in-stent restenosis at an anastomotic lesion. This was treated with angioplasty. b. cath 04/01/2015 95% ISR in SVG to OM treated with 2.75x24 Synergy DES postdilated to 3.7m, all other grafts patent   Cataract    surgery,B/L   CHF (congestive heart failure) (HCC)    Chronic kidney disease    nephrolithiasis   Diabetes mellitus    TYPE 2   GERD (gastroesophageal reflux disease)    Hernia     Hypercholesterolemia    Hypertension    Incontinence    hx  over 1 year,leaks without awareness   Other and unspecified diseases of appendix    PAF (paroxysmal atrial fibrillation) ([place]    in the setting of ischemia on 03/31/2015   Pancreatitis    [place]state [place] ([place] 03/01[place]04   prostate bx=Adenocarcinoam,gleason 3+4=7,PSA=6.75   Shortness of breath dyspnea    Ulcer    hx gastric   Past Surgical History:  Procedure Laterality Date   APPENDECTOMY     BACK SURGERY     CARDIAC CATHETERIZATION N/A 04/01/2015   Procedure: Left Heart Cath and Cors/Grafts Angiography;  Surgeon: JJettie Booze MD;  [place] [facility];  Service: Cardiovascular;  Laterality: N/A;   CARDIAC CATHETERIZATION N/A 04/01/2015   Procedure: Coronary Stent Intervention;  Surgeon: JJettie Booze MD;  [place] [facility];  Service: Cardiovascular;  Laterality: N/A;   CARDIOVERSION N/A 11/01/2019   Procedure: CARDIOVERSION;  Surgeon: OGeralynn Rile MD;  [place]  Service: Cardiovascular;  Laterality: N/A;   CARPAL TUNNEL RELEASE  2004   both hands   CORONARY ARTERY BYPASS GRAFT     CORONARY BALLOON ANGIOPLASTY N/A 08/19/2021   Procedure: CORONARY BALLOON ANGIOPLASTY;  Surgeon: MBurnell Blanks MD;  [place];  Service: Cardiovascular;  Laterality: N/A;   CORONARY STENT INTERVENTION N/A 09/23/2020  Procedure: CORONARY STENT INTERVENTION;  Surgeon: Sherren Mocha, MD;  Location: [place] [facility];  Service: Cardiovascular;  Laterality: N/A;   CYSTOSCOPY  08/23/12   incomplete emoptying bladder   HERNIA REPAIR     incision and drainage of right chest abscess     INTRAOPERATIVE TRANSTHORACIC ECHOCARDIOGRAM Left 04/28/2021   Procedure: INTRAOPERATIVE TRANSTHORACIC ECHOCARDIOGRAM;  Surgeon: Burnell Blanks, MD;  Location: [place];  Service: Open Heart Surgery;  Laterality: Left;   LAPAROSCOPIC CHOLECYSTECTOMY  09/2009   LEFT HEART CATH AND  CORS/GRAFTS ANGIOGRAPHY N/A 08/19/2021   Procedure: LEFT HEART CATH AND CORS/GRAFTS ANGIOGRAPHY;  Surgeon: Burnell Blanks, MD;  Location: [place] [facility];  Service: Cardiovascular;  Laterality: N/A;   LEFT HEART CATHETERIZATION WITH CORONARY ANGIOGRAM N/A 07/19/2014   Procedure: LEFT HEART CATHETERIZATION WITH CORONARY ANGIOGRAM;  Surgeon: Leonie Man, MD;  Location[facility];  Service: Cardiovascular;  Laterality: N/A;   RECTAL SURGERY     RIGHT/LEFT HEART CATH AND CORONARY/GRAFT ANGIOGRAPHY N/A 09/23/2020   Procedure: RIGHT/LEFT HEART CATH AND CORONARY/GRAFT ANGIOGRAPHY;  Surgeon: Sherren Mocha, MD;  Location: [place] [facility];  Service: Cardiovascular;  Laterality: N/A;   ROBOT ASSISTED LAPAROSCOPIC RADICAL PROSTATECTOMY  2005   TRANSCATHETER AORTIC VALVE REPLACEMENT, TRANSFEMORAL Bilateral 04/28/2021   Procedure: TRANSCATHETER AORTIC VALVE REPLACEMENT, TRANSFEMORAL;  Surgeon: Burnell Blanks, MD;  Location: [place];  Service: Open Heart Surgery;  Laterality: Bilateral;   ULTRASOUND GUIDANCE FOR VASCULAR ACCESS Bilateral 04/28/2021   Procedure: ULTRASOUND GUIDANCE FOR VASCULAR ACCESS;  Surgeon: Burnell Blanks, MD;  Location: [place];  Service: Open Heart Surgery;  Laterality: Bilateral;    reports that he quit smoking about 49 years ago. His smoking use included cigarettes. He has a 2.50 pack-year smoking history. He has never used smokeless tobacco. He reports that he does not drink alcohol and does not use drugs. family history includes Cancer in his mother; Heart attack in his father. Allergies  Allergen Reactions   Codeine Other (See Comments)    Makes him feel goofy   Morphine Other (See Comments)    Nightmares and felt bad   ' Review of Systems  Constitutional:  Positive for malaise/fatigue. Negative for chills and fever.  Cardiovascular:  Negative for chest pain, leg swelling and PND.  Gastrointestinal:  Negative for abdominal pain.  Genitourinary:   Negative for dysuria.      Objective:     There were no vitals taken for this visit. BP Readings from Last 3 Encounters:  04/13/22 90/60  03/19/22 (!) 118/58  03/17/22 116/60   Wt Readings from Last 3 Encounters:  04/13/22 151 lb 9.6 oz (68.8 kg)  03/29/22 145 lb (65.8 kg)  03/19/22 151 lb (68.5 kg)      Physical Exam Vitals reviewed.  Constitutional:      Appearance: Normal appearance.  Cardiovascular:     Rate and Rhythm: Normal rate.  Pulmonary:     Effort: Pulmonary effort is normal.     Breath sounds: Normal breath sounds.  Musculoskeletal:     Right lower leg: No edema.     Left lower leg: No edema.  Skin:    Comments: Fairly extensive bruising on both forearms  Neurological:     Mental Status: He is alert.      Results for orders placed or performed in visit on 04/27/22  POCT glycosylated hemoglobin (Hb A1C)  Result Value Ref Range   Hemoglobin A1C 6.3 (A) 4.0 - 5.6 %  HbA1c POC (<> result, manual entry)     HbA1c, POC (prediabetic range)     HbA1c, POC (controlled diabetic range)        The ASCVD Risk score (Arnett DK, et al., 2019) failed to calculate for the following reasons:   The 2019 ASCVD risk score is only valid for ages 63 to 22   The patient has a prior MI or stroke diagnosis    Assessment & Plan:   #1 type 2 diabetes well controlled with A1c today 6.3%.  He is currently on Tresiba 10 units nightly and takes Januvia 50 mg daily.  We discussed signs and symptoms of hypoglycemia.  Handout given.  We did discuss possible slight scaling back of Tresiba to 8 units at night.  May consider dropping Januvia if blood sugars remain well controlled at 37-monthfollow-up  #2 fatigue.  Likely multifactorial.  This is chronic and somewhat unchanged.  Volume status appears to be stable.  Recent hemoglobin stable.  #3 easy bruising both forearms.  Related to age, Plavix, and Eliquis therapy  #4 chronic systolic heart failure.  Currently stable.  Home  weights have been very stable and no significant peripheral edema.  Seems to be doing well on current dose of torsemide 20 mg twice daily.   Return in about 3 months (around 07/28/2022).    BCarolann Littler MD

## 2022-04-29 ENCOUNTER — Ambulatory Visit: Payer: Medicare PPO

## 2022-04-29 DIAGNOSIS — I10 Essential (primary) hypertension: Secondary | ICD-10-CM

## 2022-04-29 DIAGNOSIS — E1121 Type 2 diabetes mellitus with diabetic nephropathy: Secondary | ICD-10-CM

## 2022-04-29 DIAGNOSIS — I5022 Chronic systolic (congestive) heart failure: Secondary | ICD-10-CM

## 2022-04-29 NOTE — Chronic Care Management (AMB) (Signed)
Care Management    RN Visit Note  04/29/2022 Name: [name] MRN: [id number] DOB: 24-Dec-1932  Subjective: [name] is a 88 y.o. year old male who is a primary care patient of Burchette, Alinda Sierras, MD. The care management team was consulted for assistance with disease management and care coordination needs.    Engaged with patient by telephone for follow up visit in response to provider referral for case management and/or care coordination services.   Consent to Services:   Mr. Cuny was given information about Care Management services today including:  Care Management services includes personalized support from designated clinical staff supervised by his physician, including individualized plan of care and coordination with other care providers 24/7 contact phone numbers for assistance for urgent and routine care needs. The patient may stop case management services at any time by phone call to the office staff.  Patient agreed to services and consent obtained.   Assessment: Review of patient past medical history, allergies, medications, health status, including review of consultants reports, laboratory and other test data, was performed as part of comprehensive evaluation and provision of chronic care management services.   SDOH (Social Determinants of Health) assessments and interventions performed:  SDOH Interventions    Flowsheet Row Most Recent Value  SDOH Interventions   Transportation Interventions Intervention Not Indicated        Care Plan  Allergies  Allergen Reactions   Codeine Other (See Comments)    Makes him feel goofy   Morphine Other (See Comments)    Nightmares and felt bad    Outpatient Encounter Medications as of 04/29/2022  Medication Sig   albuterol (VENTOLIN HFA) 108 (90 Base) MCG/ACT inhaler Inhale 2 puffs into the lungs every 6 (six) hours as needed for wheezing or shortness of breath.   amLODipine (NORVASC) 5 MG tablet TAKE 1 TABLET BY MOUTH  EVERY DAY   BD PEN NEEDLE NANO 2ND GEN 32G X 4 MM MISC USE AS DIRECTED.   blood glucose meter kit and supplies KIT Dispense based on patient and insurance preference. Use up to four times daily as directed. (FOR ICD-9 250.00, 250.01).   budesonide-formoterol (SYMBICORT) 160-4.5 MCG/ACT inhaler Inhale 2 puffs into the lungs in the morning and at bedtime. (Patient taking differently: Inhale 2 puffs into the lungs 2 (two) times daily as needed (sob/wheezing).)   Carboxymethylcellul-Glycerin (LUBRICATING EYE DROPS OP) Place 1 drop into both eyes daily as needed (dry eyes).   carvedilol (COREG) 6.25 MG tablet TAKE 1 TABLET (6.25 MG TOTAL) BY MOUTH 2 (TWO) TIMES DAILY WITH A MEAL.   clopidogrel (PLAVIX) 75 MG tablet TAKE 1 TABLET BY MOUTH DAILY WITH BREAKFAST.   Cyanocobalamin (VITAMIN B-12 PO) Take 1 tablet by mouth daily.   docusate sodium (STOOL SOFTENER) 100 MG capsule Take 1 capsule (100 mg total) by mouth 2 (two) times daily as needed for mild constipation.   ELIQUIS 2.5 MG TABS tablet TAKE 1 TABLET BY MOUTH TWICE A DAY   FLUoxetine (PROZAC) 10 MG capsule Take by mouth daily.   Homeopathic Products [facility] RELIEF EX) Apply 1 application topically daily as needed (knee pain).   hydrocortisone 2.5 % cream Apply 1 application topically daily as needed (facial breakouts).   Insulin Degludec (TRESIBA) 100 UNIT/ML SOLN Inject 10 Units into the skin daily.   JANUVIA 50 MG tablet TAKE 1 TABLET BY MOUTH EVERY DAY   Multiple Vitamins-Minerals (ICAPS AREDS 2 PO) Take 1 capsule by mouth daily.   nitroGLYCERIN (  NITROSTAT) 0.4 MG SL tablet PLACE 1 TABLET UNDER THE TONGUE EVERY 5 MINUTES X 3 DOSES AS NEEDED FOR CHEST PAIN   pantoprazole (PROTONIX) 40 MG tablet Take 1 tablet (40 mg total) by mouth daily.   potassium chloride (KLOR-CON 10) 10 MEQ tablet Take 1 tablet (10 mEq total) by mouth daily.   rosuvastatin (CRESTOR) 20 MG tablet Take 1 tablet (20 mg total) by mouth daily.   spironolactone (ALDACTONE) 25  MG tablet TAKE 1 TABLET (25 MG TOTAL) BY MOUTH DAILY.   torsemide (DEMADEX) 20 MG tablet Take 20 mg by mouth 2 (two) times daily. 1 tablet in the morning and 1 tablet in the evening   TRESIBA FLEXTOUCH 100 UNIT/ML FlexTouch Pen INJECT 10 UNITS INTO THE SKIN DAILY   No facility-administered encounter medications on file as of 04/29/2022.    Patient Active Problem List   Diagnosis Date Noted   Stage 3b chronic kidney disease (CKD) ([place]) 02/06/2022   Type 2 diabetes mellitus with hyperlipidemia (HCC)    Anemia    Atrial fibrillation, chronic (HCC)    Acute on chronic systolic CHF (congestive heart failure) ([place]) 02/05/2022   Depression, recurrent ([place]) 09/30/2021   NSTEMI (non-ST elevated myocardial infarction) ([place])    S/P TAVR (transcatheter aortic valve replacement) 08/18/2021   Unstable angina ([place]) 08/18/2021   Severe aortic stenosis 04/29/2021   Aortic stenosis, severe 04/27/2021   Angina at rest [facility]) 09/20/2020   Chronic diastolic (congestive) heart failure ([place]) 09/20/2020   COPD with acute bronchitis ([place]) 09/08/2020   Acute exacerbation of CHF (congestive heart failure) ([place]) 09/07/2020   COPD mixed type ([place]) 47/65/4650   Chronic systolic HF (heart failure) ([place]) 35/46/5681   Systolic dysfunction with acute on chronic heart failure ([place]) 10/23/2019   Educated about COVID-19 virus infection 10/23/2019   Persistent atrial fibrillation ([place]) 08/29/2019   Nonrheumatic aortic valve stenosis 08/29/2019   Elevated troponin 07/27/2019   HLD (hyperlipidemia) 07/27/2019   GERD (gastroesophageal reflux disease) 07/27/2019   Congestive heart failure (CHF) ([place]) 07/26/2019   Pure hypercholesterolemia    Acute on chronic diastolic CHF (congestive heart failure) (HCC)    SOB (shortness of breath) 06/04/2019   CKD (chronic kidney disease), stage III ([place]) 06/04/2019   Coronary artery disease of native artery of native heart with stable angina pectoris ([place]) 03/24/2018   Bilateral  carotid artery stenosis 07/23/2017   Palpitation 07/23/2017   Urinary urgency 07/28/2016   PAF (paroxysmal atrial fibrillation) (HCC)    DOE (dyspnea on exertion) 07/17/2014   Acute on chronic renal insufficiency- ACE and diuretics held 27/51/7001   Diastolic dysfunction- moderate on echo 2013 07/17/2014   Aortic stenosis 07/17/2014   LBBB (left bundle branch block) 07/17/2014   Chest pain 07/17/2014   Crescendo angina ([place]) 07/16/2014   CKD (chronic kidney disease) stage 4, GFR 15-29 ml/min ([place]) 12/03/2013   Squamous cell cancer of skin of hand 05/31/2013   HEAD TRAUMA, CLOSED 09/24/2010   Ischemic cardiomyopathy- last EF Normal Myoview Jan 2015 04/02/2010   INSOMNIA 04/02/2010   Moderate bilateral ICA disease by doppler June 2015 01/08/2010   DYSPHAGIA UNSPECIFIED 09/05/2009   Hx GERD/PUD 09/03/2009   CHOLELITHIASIS, WITH CHOLECYSTITIS 09/03/2009   ARTHRITIS 07/16/2009   HYPOKALEMIA 11/06/2008   CAD in native artery 11/06/2008   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 ([place]) 11/23/2007   Dyslipidemia 11/23/2007   ROSACEA 11/23/2007   DYSPNEA ON EXERTION 11/23/2007   UNS ADVRS EFF OTH RX MEDICINAL&BIOLOGICAL SBSTNC 11/23/2007  Hypertension 11/22/2007   Hx of Prostate ca 03/01/2004    Conditions to be addressed/monitored: CHF, HTN, and DMII  Care Plan : RN Care Manager Plan of Care  Updates made by Dimitri Ped, RN since 04/29/2022 12:00 AM  Completed 04/29/2022   Problem: Chronic disease management education and/or care coordination needs Resolved 04/29/2022  Priority: High     Long-Range Goal: Development of Plan of Care for Chronic Disease Management and/or Care Coordination Needs Completed 04/29/2022  Start Date: 02/12/2022  Expected End Date: 09/25/2022  Priority: High  Note:   case closed goals met Current Barriers:  Knowledge Deficits related to plan of care for management of CHF and DMII  Chronic Disease Management support and  education needs related to CHF and DMII  Hard of Hearing   Spoke with wife [name] Lengacher due pt's request due to being hard of hearing.  States pt is feeling better than he has for a long time  Denies any falls. States he is weighting daily and his weight was 141 this morning.  Denies any recent chest pains, swelling or increase in shortness of breath. States he has not been checking his B/P regularly but it his good when he does check. States he is following a low sodium diet and he is watching his sweets.  States he has been eating more fruit but not too much.  States he checks his CBG daily and it ranges 100-122.  Denies any hypoglycemia. States he likes to be active working outside in his garage and doing yard work daily  [place]):  Patient will verbalize understanding of plan for management of CHF, DMII, and falls as evidenced by voiced adherence to plan of care verbalize basic understanding of  CHF, DMII, and falls disease process and self health management plan as evidenced by voiced understanding and teach back  take all medications exactly as prescribed and will call provider for medication related questions as evidenced by dispense report and pt verbalization  attend all scheduled medical appointments: Dr. Elease Hashimoto 07/28/22, cardiology 05/28/22 as evidenced by medical records continue to work with RN Care Manager to address care management and care coordination needs related to  CHF, DMII, and falls as evidenced by adherence to CM Team Scheduled appointments through collaboration with RN Care manager, provider, and care team.  Last Annual wellness visit 03/29/22  Interventions: 1:1 collaboration with primary care provider regarding development and update of comprehensive plan of care as evidenced by provider attestation and co-signature Inter-disciplinary care team collaboration (see longitudinal plan of care) Evaluation of current treatment plan related to  self management and  patient's adherence to plan as established by provider   Heart Failure Interventions:  (Status:  Goal Met.) Long Term Goal Basic overview and discussion of pathophysiology of Heart Failure reviewed Provided education on low sodium diet Discussed importance of daily weight and advised patient to weigh and record daily Reviewed role of diuretics in prevention of fluid overload and management of heart failure; Reinforced importance of daily weight.  Reinforced heart failure zones and when to call provider  Diabetes Interventions:  (Status:  New goal. and Goal Met.) Long Term Goal Assessed patient's understanding of A1c goal:  7.5% Provided education to patient about basic DM disease process Reviewed medications with patient and discussed importance of medication adherence Provided patient with written educational materials related to hypo and hyperglycemia and importance of correct treatment Advised patient, providing education and rationale, to check cbg daily and record, calling provider  for findings outside established parameters Review of patient status, including review of consultants reports, relevant laboratory and other test results, and medications completed Reviewed s/sx of hypoglycemia and how to treat Lab Results  Component Value Date   HGBA1C 6.3 (A) 04/27/2022   Falls Interventions:  (Status:  Goal Met.) Long Term Goal Reviewed medications and discussed potential side effects of medications such as dizziness and frequent urination Advised patient of importance of notifying provider of falls Assessed for falls since last encounter Assessed patients knowledge of fall risk prevention secondary to previously provided education Reinforced to use cane if starts to have dizziness   Patient Goals/Self-Care Activities: Take all medications as prescribed Attend all scheduled provider appointments Call pharmacy for medication refills 3-7 days in advance of running out of  medications Call provider office for new concerns or questions  call office if I gain more than 2 pounds in one day or 5 pounds in one week keep legs up while sitting track weight in diary watch for swelling in feet, ankles and legs every day weigh myself daily follow rescue plan if symptoms flare-up check blood sugar at prescribed times: once daily and when you have symptoms of low or high blood sugar check feet daily for cuts, sores or redness take the blood sugar log to all doctor visits fill half of plate with vegetables manage portion size  Follow Up Plan:  The patient has been provided with contact information for the care management team and has been advised to call with any health related questions or concerns.  No further follow up required: case closed goals met       Plan: The patient has been provided with contact information for the care management team and has been advised to call with any health related questions or concerns.  No further follow up required: case closed goals met  Peter Garter RN, [facility], CDE Care Management Coordinator [facility] [phone]

## 2022-04-29 NOTE — Patient Instructions (Signed)
Visit Information case closed goals met Thank you for allowing me to share the care management and care coordination services that are available to you as part of your health plan and services through your primary care provider and medical home. Please reach out to me at [phone] if the care management/care coordination team may be of assistance to you in the future.   Peter Garter RN, Jackquline [place], CDE Care Management Coordinator [facility] [phone]

## 2022-05-27 NOTE — Progress Notes (Unsigned)
[place]                                     Cardiology Office Note:    Date:  05/28/2022   ID:  [name], DOB [birth year], MRN [id number]  PCP:  [name], MD  [facility] Cardiologist:  [name], MD / Dr. Angelena Form & Dr. [name]  (TAVR) [facility] Electrophysiologist:  None   Referring MD: [name], MD   1 year s/p TAVR  History of Present Illness:    TIGE MEAS is a 88 y.o. male with a hx of arthritis, COPD, LBBB, CAD s/p 4V CABG in 2009 by PVT and multiple subsequent PCIs, CKD stage 3-4, diabetes mellitus, GERD, HTN, HLD, carotid artery disease, chronic diastolic CHF, persistent atrial fibrillation on Eliquis, prostate cancer and severe LFLG aortic stenosis s/p TAVR (04/28/21) who presents to clinic for follow up.   Patient's cardiac history dates back to 2008 when he underwent multivessel coronary artery bypass grafting by Dr. Prescott Gum.  He has been followed intermittently ever since by Dr. Percival Spanish.   Patient has a long history of CAD s/p CABG x4 with subsequent PCIs. Cardiopulmonary exercise test in 2020 showed mixed restrictive/obstructive pattern but did have submaximal effort during exercise. Heart monitor in 09/2019 showed 100% atrial fibrillation with controlled ventricular rate and ventricular bigeminy/trigeminy. After he was adequately anticoagulated, he underwent DCCV in 10/2019. Immediately following DCCV, he remained in atrial fibrillation. However, post-procedure ECG did show normal sinus rhythm. Unfortunately, he was back in atrial fibrillation at follow-up visit in 12/2019. He continued to complain of dyspnea at this time and was referred to Pulmonology.  He was felt to have moderate obstruction with moderate restriction; positive bronchodilator response; decreased diffusion capacity by PFTs and started on inhalers.   He has undergone multiple previous stent procedures for vein graft  disease most recently in December 2021 when he underwent PCI of saphenous vein graft to the obtuse marginal branch of the left circumflex coronary artery.  Catheterization at that time revealed that all of the other bypass grafts remained widely patent and free of significant disease.  Patient has had aortic stenosis that has gradually progressed in severity. Follow-up echocardiogram performed Mar 12, 2021 revealed further progression of disease.  Left ventricular ejection fraction was estimated 45 to 50%.  The aortic valve is calcified with severe calcification, thickening, and restricted leaflet mobility involving all 3 leaflets.  Peak velocity across the aortic valve measured 2.7 m/s corresponding to mean transvalvular gradient estimated 21 mmHg but aortic valve area calculated only 0.63 cm with DVI notably quite low at 0.20 and stroke-volume index only 21.   He was evaluated by the multidisciplinary valve team and underwent successful TAVR with a 29 mm Medtronic Evolut Pro + THV via the TF approach on 04/28/21. Post operative echo showed EF 40%, normally functioning TAVR with a mean gradient of 4 mmHg and trivial PVL. Plavix was discontinued and he was started on aspirin 81 mg daily and resumed on Eliquis. One month echo s/p TAVR showed EF 40-45%, normally functioning TAVR with a mean gradient of 2 mm hg and trivial PVL.  He continued to have issues with volume overload and chest pain. He underwent repeat cath on 08/19/21 which showed severe restenosis in the distal body of SVG to OM1 in the previously stented  segment and underwent successful PTCA with balloon angioplasty only of the restenosis within the distal body of the vein graft stented segment. He was discharged home on Plavix and Eliquis for 6 months. He was sent home on double the dose of Torsemide.   He was admitted in 01/2022 with acute on chronic CHF. He was also found to have iron deficiency anemia and was transfused with IV iron. Echo showed EF  30-35%, severe RV dysfunction with normally functioning TAVR with a mean gradient of 2.3 mmHg and trivial PVL.  He was last seen by Dr. Percival Spanish in May 2023 and doing relatively well. Torsemide decreased to $RemoveBefo'40mg'kfVROVinEEH$  in AM and $Remo'20mg'YyDby$  in PM due to dehydration. Follow up creat 1.68 which was within previous baseline. Later PCP decreased torsemide down to $Remov'20mg'wqluBu$  in AM and $Remo'20mg'RpoiN$  in PM. This was later changed to $RemoveBe'20mg'LSTEapFuO$  in AM and $Remo'20mg'cneUv$  in PM by Dr. Elease Hashimoto.   Today he presents to clinic for follow-up. He and his wife played gin rummy evening. Last week had sudden syncope. No prodrome. He was out for 1-2 minutes and then felt poorly all day. His breathing is okay but still not good. He piddles around but isnt able to do what he wants.    Past Medical History:  Diagnosis Date   Aortic stenosis, moderate 09/20/2020   Arthritis    CAD (coronary artery disease)    a. Cath September 2015 LIMA to the LAD patent, SVG to PDA patent, SVG to posterior lateral patent, SVG to OM with a 90% in-stent restenosis at an anastomotic lesion. This was treated with angioplasty. b. cath 04/01/2015 95% ISR in SVG to OM treated with 2.75x24 Synergy DES postdilated to 3.40mm, all other grafts patent   Cataract    surgery,B/L   CHF (congestive heart failure) (HCC)    Chronic kidney disease    nephrolithiasis   Diabetes mellitus    TYPE 2   GERD (gastroesophageal reflux disease)    Hernia    Hypercholesterolemia    Hypertension    Incontinence    hx  over 1 year,leaks without awareness   Other and unspecified diseases of appendix    PAF (paroxysmal atrial fibrillation) ([place])    in the setting of ischemia on 03/31/2015   Pancreatitis    Prostate CA ([place]) 03/01/04   prostate bx=Adenocarcinoam,gleason 3+4=7,PSA=6.75   Shortness of breath dyspnea    Ulcer    hx gastric    Past Surgical History:  Procedure Laterality Date   APPENDECTOMY     BACK SURGERY     CARDIAC CATHETERIZATION N/A 04/01/2015   Procedure: Left Heart Cath and  Cors/Grafts Angiography;  Surgeon: Jettie Booze, MD;  Location: [place] [facility];  Service: Cardiovascular;  Laterality: N/A;   CARDIAC CATHETERIZATION N/A 04/01/2015   Procedure: Coronary Stent Intervention;  Surgeon: Jettie Booze, MD;  Location: [place] [facility];  Service: Cardiovascular;  Laterality: N/A;   CARDIOVERSION N/A 11/01/2019   Procedure: CARDIOVERSION;  Surgeon: Geralynn Rile, MD;  Location: [place];  Service: Cardiovascular;  Laterality: N/A;   CARPAL TUNNEL RELEASE  2004   both hands   CORONARY ARTERY BYPASS GRAFT     CORONARY BALLOON ANGIOPLASTY N/A 08/19/2021   Procedure: CORONARY BALLOON ANGIOPLASTY;  Surgeon: Burnell Blanks, MD;  Location: [place] [facility];  Service: Cardiovascular;  Laterality: N/A;   CORONARY STENT INTERVENTION N/A 09/23/2020   Procedure: CORONARY STENT INTERVENTION;  Surgeon: Sherren Mocha, MD;  Location: [place] [facility]  [facility];  Service: Cardiovascular;  Laterality: N/A;   CYSTOSCOPY  08/23/12   incomplete emoptying bladder   HERNIA REPAIR     incision and drainage of right chest abscess     INTRAOPERATIVE TRANSTHORACIC ECHOCARDIOGRAM Left 04/28/2021   Procedure: INTRAOPERATIVE TRANSTHORACIC ECHOCARDIOGRAM;  Surgeon: Burnell Blanks, MD;  Location: [place];  Service: Open Heart Surgery;  Laterality: Left;   LAPAROSCOPIC CHOLECYSTECTOMY  09/2009   LEFT HEART CATH AND CORS/GRAFTS ANGIOGRAPHY N/A 08/19/2021   Procedure: LEFT HEART CATH AND CORS/GRAFTS ANGIOGRAPHY;  Surgeon: Burnell Blanks, MD;  Location: [place] [facility];  Service: Cardiovascular;  Laterality: N/A;   LEFT HEART CATHETERIZATION WITH CORONARY ANGIOGRAM N/A 07/19/2014   Procedure: LEFT HEART CATHETERIZATION WITH CORONARY ANGIOGRAM;  Surgeon: Leonie Man, MD;  Location: [facility];  Service: Cardiovascular;  Laterality: N/A;   RECTAL SURGERY     RIGHT/LEFT HEART CATH AND CORONARY/GRAFT ANGIOGRAPHY N/A 09/23/2020   Procedure: RIGHT/LEFT  HEART CATH AND CORONARY/GRAFT ANGIOGRAPHY;  Surgeon: Sherren Mocha, MD;  Location: [place] [facility];  Service: Cardiovascular;  Laterality: N/A;   ROBOT ASSISTED LAPAROSCOPIC RADICAL PROSTATECTOMY  2005   TRANSCATHETER AORTIC VALVE REPLACEMENT, TRANSFEMORAL Bilateral 04/28/2021   Procedure: TRANSCATHETER AORTIC VALVE REPLACEMENT, TRANSFEMORAL;  Surgeon: Burnell Blanks, MD;  Location: [place];  Service: Open Heart Surgery;  Laterality: Bilateral;   ULTRASOUND GUIDANCE FOR VASCULAR ACCESS Bilateral 04/28/2021   Procedure: ULTRASOUND GUIDANCE FOR VASCULAR ACCESS;  Surgeon: Burnell Blanks, MD;  Location: [place];  Service: Open Heart Surgery;  Laterality: Bilateral;    Current Medications: Current Meds  Medication Sig   albuterol (VENTOLIN HFA) 108 (90 Base) MCG/ACT inhaler Inhale 2 puffs into the lungs every 6 (six) hours as needed for wheezing or shortness of breath.   amLODipine (NORVASC) 5 MG tablet TAKE 1 TABLET BY MOUTH EVERY DAY   BD PEN NEEDLE NANO 2ND GEN 32G X 4 MM MISC USE AS DIRECTED.   blood glucose meter kit and supplies KIT Dispense based on patient and insurance preference. Use up to four times daily as directed. (FOR ICD-9 250.00, 250.01).   budesonide-formoterol (SYMBICORT) 160-4.5 MCG/ACT inhaler Inhale 2 puffs into the lungs in the morning and at bedtime. (Patient taking differently: Inhale 2 puffs into the lungs 2 (two) times daily as needed (sob/wheezing).)   Carboxymethylcellul-Glycerin (LUBRICATING EYE DROPS OP) Place 1 drop into both eyes daily as needed (dry eyes).   carvedilol (COREG) 6.25 MG tablet TAKE 1 TABLET (6.25 MG TOTAL) BY MOUTH 2 (TWO) TIMES DAILY WITH A MEAL.   clopidogrel (PLAVIX) 75 MG tablet TAKE 1 TABLET BY MOUTH DAILY WITH BREAKFAST.   Cyanocobalamin (VITAMIN B-12 PO) Take 1 tablet by mouth daily.   docusate sodium (STOOL SOFTENER) 100 MG capsule Take 1 capsule (100 mg total) by mouth 2 (two) times daily as needed for mild constipation.    ELIQUIS 2.5 MG TABS tablet TAKE 1 TABLET BY MOUTH TWICE A DAY   FLUoxetine (PROZAC) 10 MG capsule Take by mouth daily.   Homeopathic Products [facility] RELIEF EX) Apply 1 application topically daily as needed (knee pain).   hydrocortisone 2.5 % cream Apply 1 application topically daily as needed (facial breakouts).   Insulin Degludec (TRESIBA) 100 UNIT/ML SOLN Inject 10 Units into the skin daily.   JANUVIA 50 MG tablet TAKE 1 TABLET BY MOUTH EVERY DAY   Multiple Vitamins-Minerals (ICAPS AREDS 2 PO) Take 1 capsule by mouth daily.   nitroGLYCERIN (NITROSTAT) 0.4 MG SL tablet PLACE 1  TABLET UNDER THE TONGUE EVERY 5 MINUTES X 3 DOSES AS NEEDED FOR CHEST PAIN   pantoprazole (PROTONIX) 40 MG tablet Take 1 tablet (40 mg total) by mouth daily.   potassium chloride (KLOR-CON 10) 10 MEQ tablet Take 1 tablet (10 mEq total) by mouth daily.   rosuvastatin (CRESTOR) 20 MG tablet Take 1 tablet (20 mg total) by mouth daily.   spironolactone (ALDACTONE) 25 MG tablet TAKE 1 TABLET (25 MG TOTAL) BY MOUTH DAILY.   torsemide (DEMADEX) 20 MG tablet Take 20 mg by mouth 2 (two) times daily. 1 tablet in the morning and 1 tablet in the evening   TRESIBA FLEXTOUCH 100 UNIT/ML FlexTouch Pen INJECT 10 UNITS INTO THE SKIN DAILY     Allergies:   Codeine and Morphine   Social History   Socioeconomic History   Marital status: Married    Spouse name: Not on file   Number of children: 2   Years of education: Not on file   Highest education level: Not on file  Occupational History    Employer: RETIRED    Comment: Retired  Tobacco Use   Smoking status: Former    Packs/day: 0.50    Years: 5.00    Total pack years: 2.50    Types: Cigarettes    Quit date: 07/20/1972    Years since quitting: 49.8   Smokeless tobacco: Never   Tobacco comments:    quit s  Vaping Use   Vaping Use: Never used  Substance and Sexual Activity   Alcohol use: No   Drug use: No    Comment: quit smoking 40 years ago   Sexual activity: Not  Currently  Other Topics Concern   Not on file  Social History Narrative   Retired   Married      Social Determinants of Health   Financial Resource Strain: [place]  (03/29/2022)   Overall Financial Resource Strain (CARDIA)    Difficulty of Paying Living Expenses: Not hard at all  Food Insecurity: No Food Insecurity (03/29/2022)   Hunger Vital Sign    Worried About Running Out of Food in the Last Year: Never true    Ran Out of Food in the Last Year: Never true  Transportation Needs: No Transportation Needs (04/29/2022)   PRAPARE - Hydrologist (Medical): No    Lack of Transportation (Non-Medical): No  Recent Concern: Transportation Needs - Unmet Transportation Needs (02/12/2022)   PRAPARE - Transportation    Lack of Transportation (Medical): Yes    Lack of Transportation (Non-Medical): Yes  Physical Activity: Inactive (03/29/2022)   Exercise Vital Sign    Days of Exercise per Week: 0 days    Minutes of Exercise per Session: 0 min  Stress: No Stress Concern Present (03/29/2022)   [place]    Feeling of Stress : Not at all  Social Connections: [place] (03/02/2021)   Social Connection and Isolation Panel [NHANES]    Frequency of Communication with Friends and Family: More than three times a week    Frequency of Social Gatherings with Friends and Family: More than three times a week    Attends Religious Services: More than 4 times per year    Active Member of [employer] or Organizations: Yes    Attends Archivist Meetings: 1 to 4 times per year    Marital Status: Married     Family History: The patient's family history includes Cancer  in his mother; Heart attack in his father.  ROS:   Please see the history of present illness.    All other systems reviewed and are negative.  EKGs/Labs/Other Studies Reviewed:    The following studies were reviewed today:    TAVR  OPERATIVE NOTE     Date of Procedure:                04/28/2021   Preoperative Diagnosis:      Severe Aortic Stenosis   Postoperative Diagnosis:    Same   Procedure:        Transcatheter Aortic Valve Replacement - Percutaneous Right Transfemoral Approach             Medtronic CoreValve Evolut Pro Plus (size 29 mm, serial # [id number])              Co-Surgeons:                        Valentina Gu. [name], MD and Lauree Chandler, MD   Anesthesiologist:                  Myrtie Soman, MD   Echocardiographer:              Jenkins Rouge, MD   Pre-operative Echo Findings: Severe aortic stenosis Low normal left ventricular systolic function   Post-operative Echo Findings: Trivial paravalvular leak Unchanged left ventricular systolic function   _______________   Echo 05/12/21 IMPRESSIONS   1. Septal , apical and inferior wall hypokinesis . Left ventricular  ejection fraction, by estimation, is 40 to 45%. The left ventricle has  mildly decreased function. The left ventricle demonstrates regional wall  motion abnormalities (see scoring  diagram/findings for description). The left ventricular internal cavity  size was mildly dilated. There is moderate left ventricular hypertrophy.  Left ventricular diastolic parameters are indeterminate.   2. Right ventricular systolic function is normal. The right ventricular  size is normal. There is normal pulmonary artery systolic pressure.   3. Left atrial size was moderately dilated.   4. Right atrial size was mildly dilated.   5. The mitral valve is abnormal. Mild mitral valve regurgitation. No  evidence of mitral stenosis. Moderate mitral annular calcification.   6. Post TAVR 04/28/21 with 29 mm Medtronic Evolut Pro valve Trivial PVL  seen best on PSSA images 6:00 mean gradient 4 peak 6 mmHg AVR 2.8 cm2. The  aortic valve has been repaired/replaced. Aortic valve regurgitation is not  visualized. No aortic stenosis  is present. Procedure Date:  0712/2022.   7. The inferior vena cava is normal in size with greater than 50%  respiratory variability, suggesting right atrial pressure of 3 mmHg.   ___________________  Echo 06/04/21 IMPRESSIONS  1. 29 mm Corevalve TAVR (04/28/2021). Vmax 1.2 m/s, MG 3 mmHG, EOA 3.33  cm2, DI 0.74. Trivial paravalvular leak noted. Normal prosthesis . The  aortic valve has been repaired/replaced. Aortic valve regurgitation is  trivial. There is a 29 mm Medtronic  Evolut Pro( TAVR) valve present in the aortic position. Procedure Date:  04/28/2021.   2. Left ventricular ejection fraction, by estimation, is 40 to 45%. The  left ventricle has mildly decreased function. The left ventricle  demonstrates global hypokinesis. There is moderate asymmetric left  ventricular hypertrophy of the basal-septal  segment. Left ventricular diastolic function could not be evaluated.   3. Right ventricular systolic function is mildly reduced. The right  ventricular size is mildly enlarged.  There is mildly elevated pulmonary  artery systolic pressure. The estimated right ventricular systolic  pressure is 45.4 mmHg.   4. Left atrial size was mildly dilated.   5. Right atrial size was mildly dilated.   6. The mitral valve is grossly normal. Mild mitral valve regurgitation.  No evidence of mitral stenosis.   7. The inferior vena cava is normal in size with <50% respiratory  variability, suggesting right atrial pressure of 8 mmHg.   Comparison(s): No significant change from prior study.   ___________________  LHC 08/19/21  CORONARY BALLOON ANGIOPLASTY  LEFT HEART CATH AND CORS/GRAFTS ANGIOGRAPHY   Conclusion      LM lesion is 25% stenosed.   Ost LM to Mid LM lesion is 50% stenosed.   Mid LAD lesion is 80% stenosed.   Mid Cx to Dist Cx lesion is 80% stenosed.   Ost RCA to Prox RCA lesion is 100% stenosed.   Origin lesion is 40% stenosed.   RPDA lesion is 30% stenosed.   SVG to OM1 is patent. Dist Graft to  Insertion lesion is 95% stenosed.   Balloon angioplasty was performed using a BALLN [place] EMERGE MR 3.5X12.   Post intervention, there is a 30% residual stenosis.   LIMA to LAD is patent   SVG to PDA is patent   SVG to OM2 is patent   Severe triple vessel CAD s/p 4V CABG with 4/4 patent grafts Unable to selectively engage the left main artery due to obstruction of the ostium by the TAVR valve stent cage. Based on non-selective angiography the LAD and Circumflex are patent with anatomy likely unchanged from cath in 2022 but cannot definitively say this. All three grafts to the left system are patent.  Chronic occlusion proximal RCA Patent LIMA to LAD, SVG to OM1, SVG to OM2 and SVG to PDA Severe restenosis in the distal body of SVG to OM1 in the previously stented segment Successful PTCA with balloon angioplasty only of the restenosis within the distal body of the vein graft stented segment   Recommendations: I would plan to d/c home tomorrow with plans for Plavix and Eliquis for 6 months if possible. Stop ASA before discharge.   _________________________    Echo 02/06/22 IMPRESSIONS   1. Left ventricular ejection fraction, by estimation, is 30 to 35%. Left  ventricular ejection fraction by 2D MOD biplane is 35.0 %. The left  ventricle has moderately decreased function. The left ventricle  demonstrates global hypokinesis. There is mild  left ventricular hypertrophy. Left ventricular diastolic function could  not be evaluated. Elevated left ventricular end-diastolic pressure.   2. Right ventricular systolic function is severely reduced. The right  ventricular size is mildly enlarged. There is mildly elevated pulmonary  artery systolic pressure. The estimated right ventricular systolic  pressure is 09.8 mmHg.   3. Left atrial size was moderately dilated.   4. The mitral valve is grossly normal. Trivial mitral valve  regurgitation.   5. The aortic valve has been repaired/replaced. Aortic  valve  regurgitation is trivial. There is a 29 mm Medtronic Evolut Pro( TAVR)  valve present in the aortic position. Procedure Date: 04/28/21. Echo  findings are consistent with perivalvular leak of the   aortic prosthesis. Aortic regurgitation PHT measures 696 msec. Aortic  valve area, by VTI measures 2.77 cm. Aortic valve mean gradient measures  2.3 mmHg. Aortic valve Vmax measures 1.13 m/s.   6. Aortic dilatation noted. There is mild dilatation of the ascending  aorta, measuring  40 mm.   7. The inferior vena cava is normal in size with <50% respiratory  variability, suggesting right atrial pressure of 8 mmHg.   Comparison(s): Changes from prior study are noted. 06/04/2021: LVEF 40-45%.   __________________   Echo 05/28/22 IMPRESSIONS     1. Left ventricular ejection fraction, by estimation, is 35 to 40%. The  left ventricle has moderately decreased function. The left ventricle  demonstrates global hypokinesis. There is mild concentric left ventricular  hypertrophy. Left ventricular  diastolic function could not be evaluated. There is moderate hypokinesis  of the left ventricular, basal-mid inferior wall. There is moderate  hypokinesis of the left ventricular, basal-mid anteroseptal wall and  inferoseptal wall. There is  disproportionately severe hypokinesis/akinesis of the basal mid anterior  septum with preserved apical contractility (suggests occluded native  artery with patent bypass to the distal LAD) and hypokinesis of the  inferior andinferoseptal walls.   2. Right ventricular systolic function is moderately reduced. The right  ventricular size is normal. There is moderately elevated pulmonary artery  systolic pressure. The estimated right ventricular systolic pressure is  16.1 mmHg.   3. Left atrial size was moderately dilated.   4. Right atrial size was moderately dilated.   5. The mitral valve is normal in structure. Mild to moderate mitral valve  regurgitation.    6. The aortic valve has been repaired/replaced. Aortic valve  regurgitation is mild. No aortic stenosis is present. There is a 29 mm  CoreValve-EvolutR prosthetic (TAVR) valve present in the aortic position.  Aortic valve mean gradient measures 2.0 mmHg.  Aortic valve Vmax measures 0.96 m/s. Aortic valve acceleration time  measures 60 msec.   7. The inferior vena cava is normal in size with greater than 50%  respiratory variability, suggesting right atrial pressure of 3 mmHg.   Comparison(s): No significant change from prior study. Prior images  reviewed side by side. The left ventricular function is unchanged. The  left ventricular wall motion abnormalities are unchanged. Unchanged  prosthetic aortic valve function and mild  pulmonary HTN.   EKG:  EKG is NOT ordered today.  Recent Labs: 08/18/2021: TSH 2.079 02/01/2022: Pro B Natriuretic peptide (BNP) 1,846.0 02/05/2022: B Natriuretic Peptide 1,573.0 02/06/2022: ALT 17 02/08/2022: Magnesium 2.0 02/15/2022: BUN 29; Creatinine, Ser 1.68; Potassium 4.1; Sodium 136 03/17/2022: Hemoglobin 12.7; Platelets 118.0  Recent Lipid Panel    Component Value Date/Time   CHOL 122 08/19/2021 0234   CHOL 97 (L) 01/01/2021 0848   TRIG 68 08/19/2021 0234   TRIG 306 (HH) 09/27/2006 1154   HDL 35 (L) 08/19/2021 0234   HDL 33 (L) 01/01/2021 0848   CHOLHDL 3.5 08/19/2021 0234   VLDL 14 08/19/2021 0234   LDLCALC 73 08/19/2021 0234   LDLCALC 46 01/01/2021 0848   LDLDIRECT 143.3 11/23/2007 0000     Risk Assessment/Calculations:    CHA2DS2-VASc Score = 6  This indicates a 9.7% annual risk of stroke. The patient's score is based upon: CHF History: 1 HTN History: 1 Diabetes History: 1 Stroke History: 0 Vascular Disease History: 1 Age Score: 2 Gender Score: 0     Physical Exam:    VS:  BP 112/60   Pulse 60   Ht $R'5\' 9"'Ms$  (1.753 m)   Wt 152 lb 9.6 oz (69.2 kg)   SpO2 99%   BMI 22.54 kg/m     Wt Readings from Last 3 Encounters:  05/28/22 152  lb 9.6 oz (69.2 kg)  04/27/22 151 lb 9.6 oz (68.8  kg)  04/13/22 151 lb 9.6 oz (68.8 kg)     GEN:  Well nourished, well developed in no acute distress HEENT: Normal NECK: mild JVD LYMPHATICS: No lymphadenopathy CARDIAC: irreg irreg, no murmurs, rubs, gallops RESPIRATORY:clear  ABDOMEN: Soft, non-tender, non-distended MUSCULOSKELETAL: trace pretibial edema.  SKIN: Warm and dry.  NEUROLOGIC:  Alert and oriented x 3 PSYCHIATRIC:  Normal affect   ASSESSMENT:    1. S/P TAVR (transcatheter aortic valve replacement)   2. CAD in native artery   3. Chronic kidney disease, unspecified CKD stage   4. Chronic systolic HF (heart failure) ([place])   5. Bilateral carotid artery stenosis   6. Atrial fibrillation, chronic ([place])   7. Syncope and collapse       PLAN:    In order of problems listed above:   Severe AS s/p TAVR: echo today shows EF 35-40%, moderate RV dysfunction, normally functioning TAVR with a mean gradient of 2 mm hg and mild AR as well as moderate pulm HTN and mild to mod MR.  He is currently doing much better with NYHA class II symptoms but mostly limited by weakness. Continue on Plavix and Eliquis. Continue regular follow up with Dr. Percival Spanish.   CAD with chest pain: he underwent repeat cath on 08/19/21 which showed severe restenosis in the distal body of SVG to OM1 in the previously stented segment and underwent successful PTCA with balloon angioplasty only of the restenosis within the distal body of the vein graft stented segment. He was discharged home on Plavix and Eliquis. He has been tolerating this well and would like to continue on this vs changing plavix to aspirin.   CKD stage IIIB: creat ~1.68 on most recent check. Baseline creat 1.5-1.9.  Chronic combined S/D CHF: weight 152 lbs today which is his near baseline. Continue torsemide 20 in am and $Remo'20mg'QsYlG$  in PM.   Carotid arterial disease: 75-64% LICA stenosis. Repeat dopplers due in 03/2022. These have been  ordered  Persistent atrial fibrillation: continue Eliquis. Rate well controlled.   Syncope: sounds cardiogenic will place a Zio monitor to rule out heart block or arrhythmia.   Medication Adjustments/Labs and Tests Ordered: Current medicines are reviewed at length with the patient today.  Concerns regarding medicines are outlined above.  Orders Placed This Encounter  Procedures   LONG TERM MONITOR (3-14 DAYS)     No orders of the defined types were placed in this encounter.     Patient Instructions  Medication Instructions:  Your physician recommends that you continue on your current medications as directed. Please refer to the Current Medication list given to you today.  *If you need a refill on your cardiac medications before your next appointment, please call your pharmacy*   Lab Work: None ordered   If you have labs (blood work) drawn today and your tests are completely normal, you will receive your results only by: [place] (if you have MyChart) OR A paper copy in the mail If you have any lab test that is abnormal or we need to change your treatment, we will call you to review the results.   Testing/Procedures: A zio monitor was ordered today. It will remain on for 14 days. You will then return monitor and event diary in provided box. It takes 1-2 weeks for report to be downloaded and returned to [place]. We will call you with the results. If monitor falls off or has orange flashing light, please call Zio for further instructions.  Follow-Up: Follow up as scheduled   Other Instructions ZIO XT- Long Term Monitor Instructions  Your physician has requested you wear a ZIO patch monitor for 14 days.  This is a single patch monitor. [employer] supplies one patch monitor per enrollment. Additional stickers are not available. Please do not apply patch if you will be having a Nuclear Stress Test,  Echocardiogram, Cardiac CT, MRI, or Chest Xray during the period you  would be wearing the  monitor. The patch cannot be worn during these tests. You cannot remove and re-apply the  ZIO XT patch monitor.  Your ZIO patch monitor will be mailed 3 day USPS to your address on file. It may take 3-5 days  to receive your monitor after you have been enrolled.  Once you have received your monitor, please review the enclosed instructions. Your monitor  has already been registered assigning a specific monitor serial # to you.  Billing and Patient Assistance Program Information  We have supplied [employer] with any of your insurance information on file for billing purposes. [employer] offers a sliding scale Patient Assistance Program for patients that do not have  insurance, or whose insurance does not completely cover the cost of the ZIO monitor.  You must apply for the Patient Assistance Program to qualify for this discounted rate.  To apply, please call [employer] at [phone], select option 4, select option 2, ask to apply for  Patient Assistance Program. Theodore Demark will ask your household income, and how many people  are in your household. They will quote your out-of-pocket cost based on that information.  [employer] will also be able to set up a 45-month, interest-free payment plan if needed.  Applying the monitor   Shave hair from upper left chest.  Hold abrader disc by orange tab. Rub abrader in 40 strokes over the upper left chest as  indicated in your monitor instructions.  Clean area with 4 enclosed alcohol pads. Let dry.  Apply patch as indicated in monitor instructions. Patch will be placed under collarbone on left  side of chest with arrow pointing upward.  Rub patch adhesive wings for 2 minutes. Remove white label marked "1". Remove the white  label marked "2". Rub patch adhesive wings for 2 additional minutes.  While looking in a mirror, press and release button in center of patch. A small green light will  flash 3-4 times. This will be your only indicator that  the monitor has been turned on.  Do not shower for the first 24 hours. You may shower after the first 24 hours.  Press the button if you feel a symptom. You will hear a small click. Record Date, Time and  Symptom in the Patient Logbook.  When you are ready to remove the patch, follow instructions on the last 2 pages of Patient  Logbook. Stick patch monitor onto the last page of Patient Logbook.  Place Patient Logbook in the blue and white box. Use locking tab on box and tape box closed  securely. The blue and white box has prepaid postage on it. Please place it in the mailbox as  soon as possible. Your physician should have your test results approximately 7 days after the  monitor has been mailed back to [facility].  Call [place] at [phone] if you have questions regarding  your ZIO XT patch monitor. Call them immediately if you see an orange light blinking on your  monitor.  If your monitor falls off in less than 4  days, contact our Monitor department at [phone].  If your monitor becomes loose or falls off after 4 days call [employer] at [phone] for  suggestions on securing your monitor            Signed, Angelena Form, Hershal Coria  05/28/2022 11:12 AM    [place]

## 2022-05-28 ENCOUNTER — Ambulatory Visit (HOSPITAL_COMMUNITY): Payer: Medicare PPO | Attending: Cardiovascular Disease

## 2022-05-28 ENCOUNTER — Ambulatory Visit: Payer: Medicare PPO | Admitting: Physician Assistant

## 2022-05-28 ENCOUNTER — Ambulatory Visit (INDEPENDENT_AMBULATORY_CARE_PROVIDER_SITE_OTHER): Payer: Medicare PPO

## 2022-05-28 VITALS — BP 112/60 | HR 60 | Ht 69.0 in | Wt 152.6 lb

## 2022-05-28 DIAGNOSIS — Z952 Presence of prosthetic heart valve: Secondary | ICD-10-CM

## 2022-05-28 DIAGNOSIS — I6523 Occlusion and stenosis of bilateral carotid arteries: Secondary | ICD-10-CM

## 2022-05-28 DIAGNOSIS — I2510 Atherosclerotic heart disease of native coronary artery without angina pectoris: Secondary | ICD-10-CM | POA: Diagnosis not present

## 2022-05-28 DIAGNOSIS — I5022 Chronic systolic (congestive) heart failure: Secondary | ICD-10-CM | POA: Diagnosis not present

## 2022-05-28 DIAGNOSIS — R55 Syncope and collapse: Secondary | ICD-10-CM

## 2022-05-28 DIAGNOSIS — I482 Chronic atrial fibrillation, unspecified: Secondary | ICD-10-CM | POA: Diagnosis not present

## 2022-05-28 DIAGNOSIS — N189 Chronic kidney disease, unspecified: Secondary | ICD-10-CM | POA: Diagnosis not present

## 2022-05-28 LAB — ECHOCARDIOGRAM COMPLETE
AV Mean grad: 2 mmHg
AV Peak grad: 3.7 mmHg
Ao pk vel: 0.96 m/s
P 1/2 time: 383 msec
S' Lateral: 3.8 cm

## 2022-05-28 NOTE — Patient Instructions (Addendum)
Medication Instructions:  Your physician recommends that you continue on your current medications as directed. Please refer to the Current Medication list given to you today.  *If you need a refill on your cardiac medications before your next appointment, please call your pharmacy*   Lab Work: None ordered   If you have labs (blood work) drawn today and your tests are completely normal, you will receive your results only by: [place] (if you have MyChart) OR A paper copy in the mail If you have any lab test that is abnormal or we need to change your treatment, we will call you to review the results.   Testing/Procedures: A zio monitor was ordered today. It will remain on for 14 days. You will then return monitor and event diary in provided box. It takes 1-2 weeks for report to be downloaded and returned to [place]. We will call you with the results. If monitor falls off or has orange flashing light, please call Zio for further instructions.     Follow-Up: Follow up as scheduled   Other Instructions ZIO XT- Long Term Monitor Instructions  Your physician has requested you wear a ZIO patch monitor for 14 days.  This is a single patch monitor. [employer] supplies one patch monitor per enrollment. Additional stickers are not available. Please do not apply patch if you will be having a Nuclear Stress Test,  Echocardiogram, Cardiac CT, MRI, or Chest Xray during the period you would be wearing the  monitor. The patch cannot be worn during these tests. You cannot remove and re-apply the  ZIO XT patch monitor.  Your ZIO patch monitor will be mailed 3 day USPS to your address on file. It may take 3-5 days  to receive your monitor after you have been enrolled.  Once you have received your monitor, please review the enclosed instructions. Your monitor  has already been registered assigning a specific monitor serial # to you.  Billing and Patient Assistance Program Information  We have  supplied [employer] with any of your insurance information on file for billing purposes. [employer] offers a sliding scale Patient Assistance Program for patients that do not have  insurance, or whose insurance does not completely cover the cost of the ZIO monitor.  You must apply for the Patient Assistance Program to qualify for this discounted rate.  To apply, please call [employer] at [phone], select option 4, select option 2, ask to apply for  Patient Assistance Program. Theodore Demark will ask your household income, and how many people  are in your household. They will quote your out-of-pocket cost based on that information.  [employer] will also be able to set up a 91-month interest-free payment plan if needed.  Applying the monitor   Shave hair from upper left chest.  Hold abrader disc by orange tab. Rub abrader in 40 strokes over the upper left chest as  indicated in your monitor instructions.  Clean area with 4 enclosed alcohol pads. Let dry.  Apply patch as indicated in monitor instructions. Patch will be placed under collarbone on left  side of chest with arrow pointing upward.  Rub patch adhesive wings for 2 minutes. Remove white label marked "1". Remove the white  label marked "2". Rub patch adhesive wings for 2 additional minutes.  While looking in a mirror, press and release button in center of patch. A small green light will  flash 3-4 times. This will be your only indicator that the monitor has been turned on.  Do  not shower for the first 24 hours. You may shower after the first 24 hours.  Press the button if you feel a symptom. You will hear a small click. Record Date, Time and  Symptom in the Patient Logbook.  When you are ready to remove the patch, follow instructions on the last 2 pages of Patient  Logbook. Stick patch monitor onto the last page of Patient Logbook.  Place Patient Logbook in the blue and white box. Use locking tab on box and tape box closed  securely. The blue and  white box has prepaid postage on it. Please place it in the mailbox as  soon as possible. Your physician should have your test results approximately 7 days after the  monitor has been mailed back to [facility].  Call [place] at [phone] if you have questions regarding  your ZIO XT patch monitor. Call them immediately if you see an orange light blinking on your  monitor.  If your monitor falls off in less than 4 days, contact our Monitor department at [phone].  If your monitor becomes loose or falls off after 4 days call [employer] at [phone] for  suggestions on securing your monitor

## 2022-05-28 NOTE — Progress Notes (Unsigned)
ZIO XT mailed to pt's home address. 

## 2022-05-29 IMAGING — DX DG SHOULDER 2+V*R*
3 series · 3 of 3 positions shown · non-contrast
Comparison: None.

CLINICAL DATA: Right shoulder pain since a fall 3 days ago. Initial
encounter.

EXAM:
RIGHT SHOULDER - 2+ VIEW

[shoulder internal rotation ap]
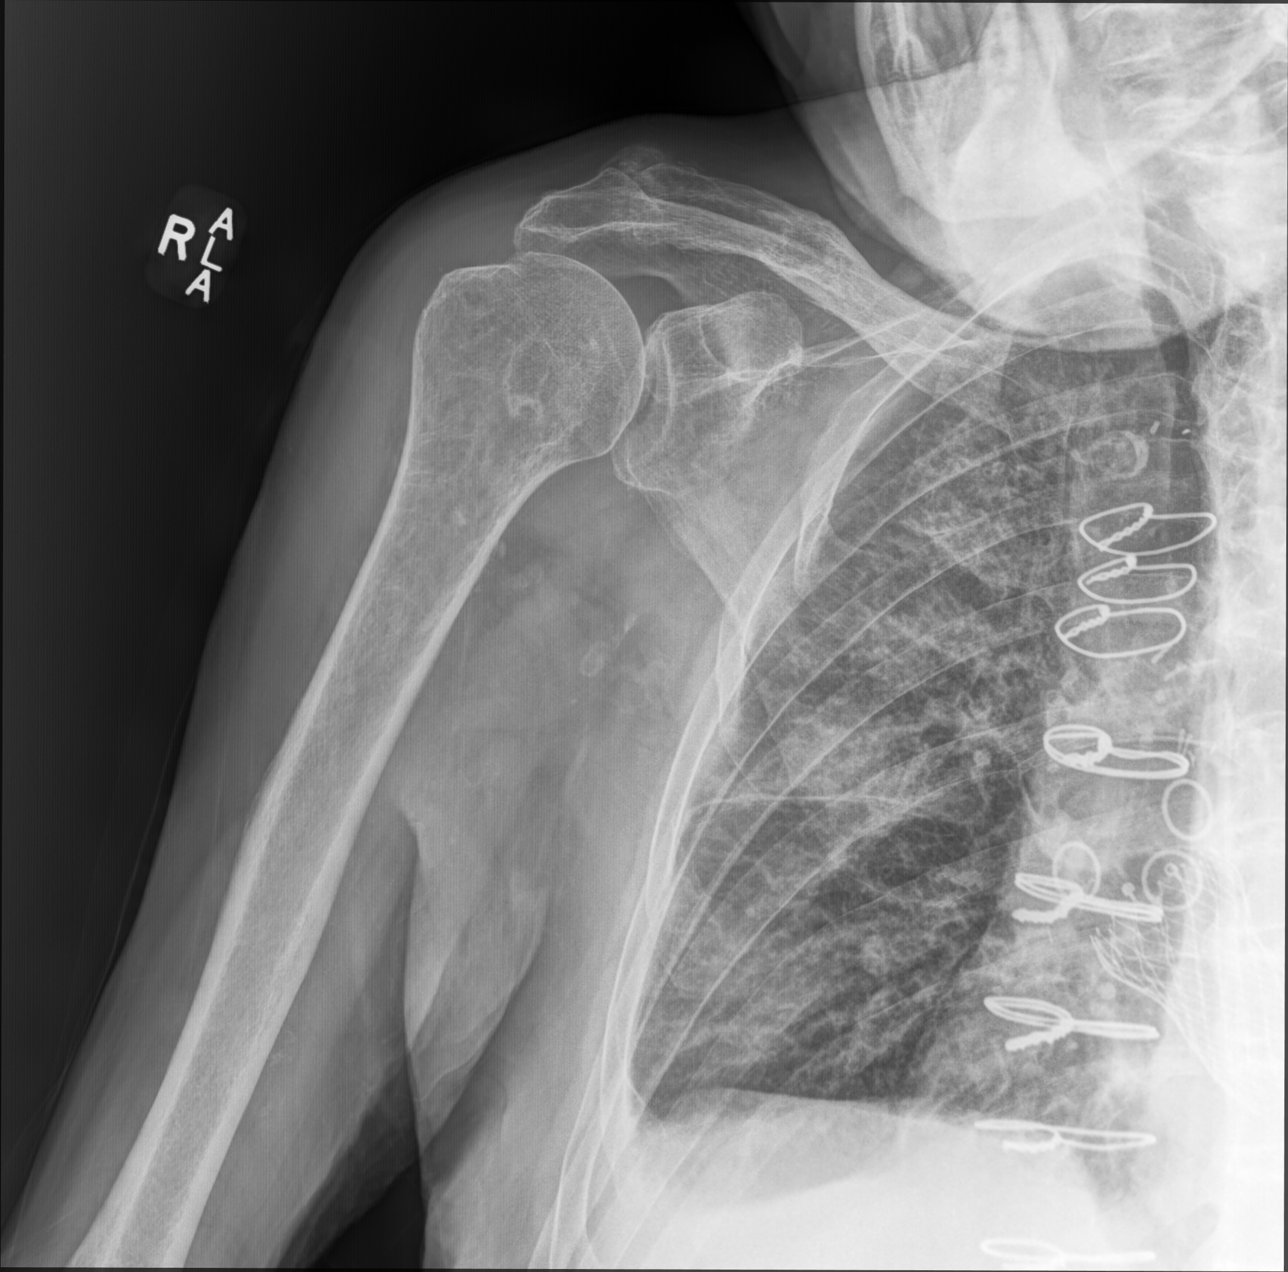

[shoulder (y view)]
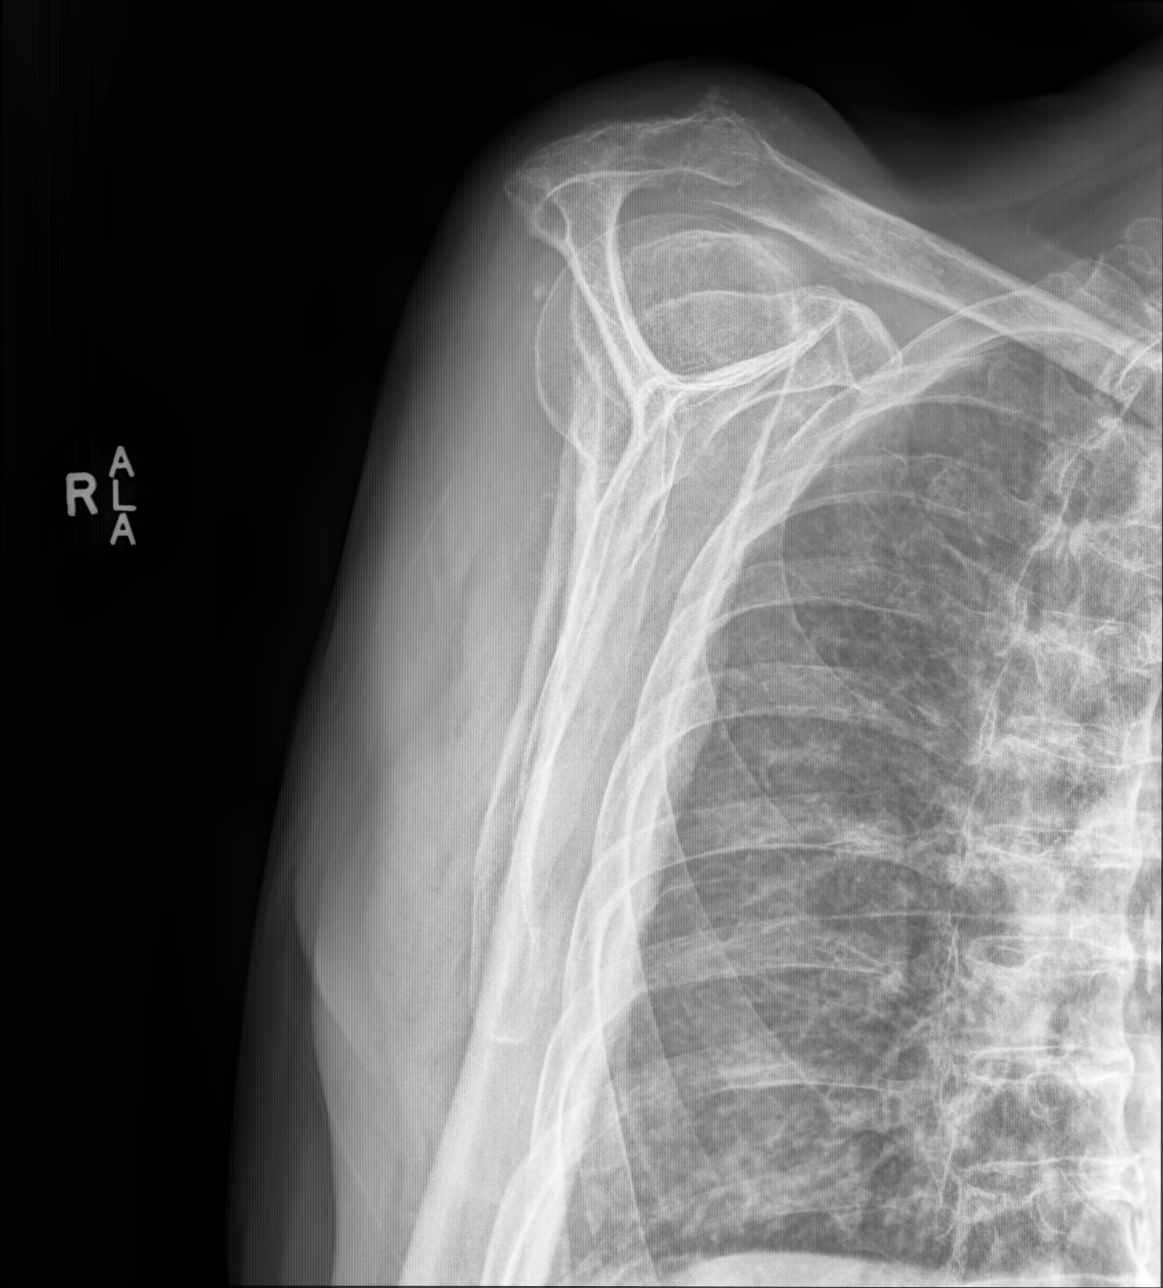

[shoulder (axial)]
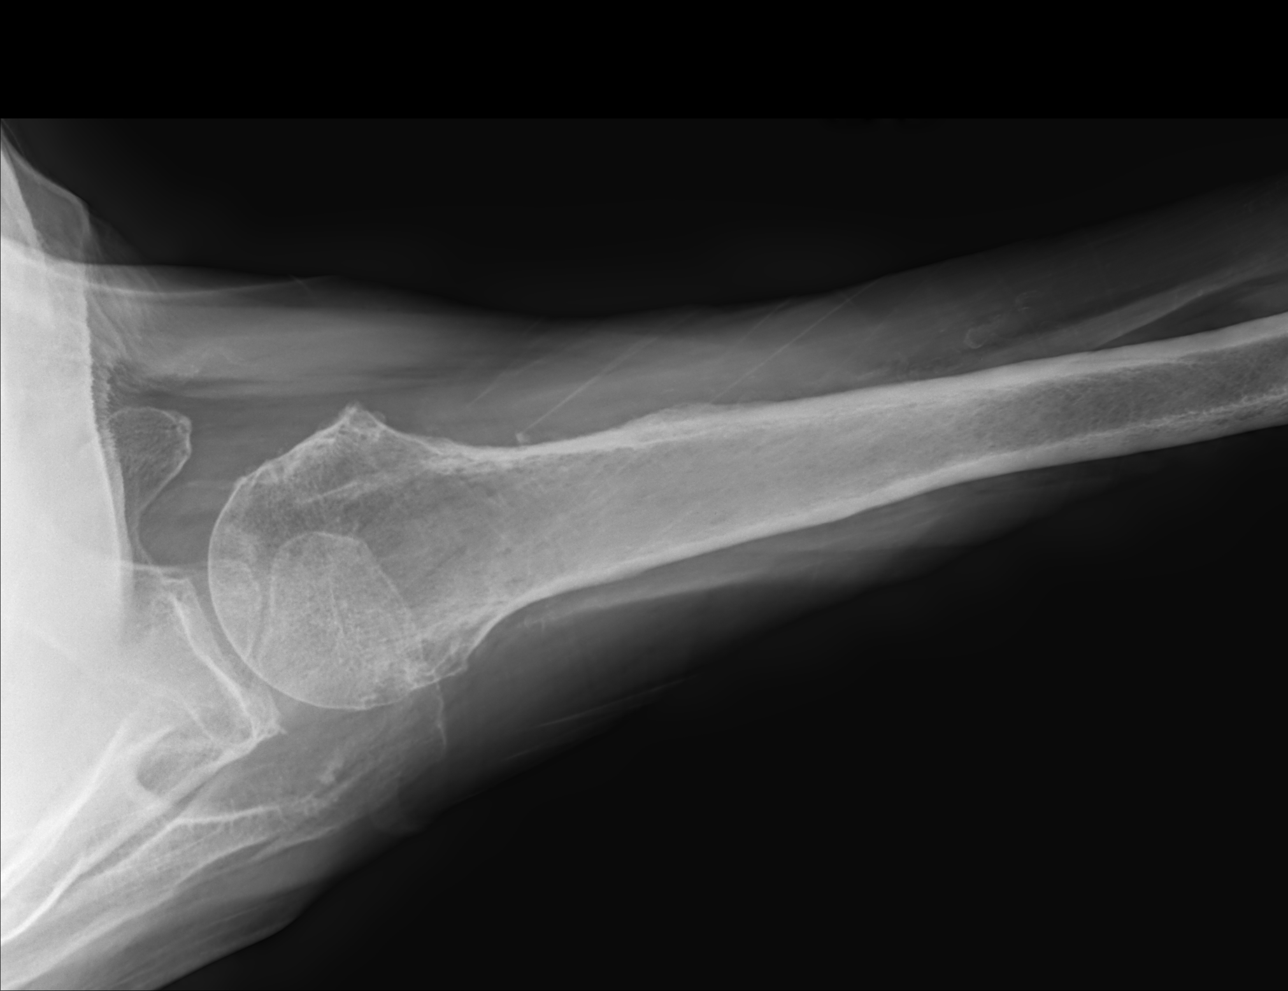

[3 of 3 positions shown; findings below may reference images not displayed]

FINDINGS: Three views of the right shoulder are provided. No acute bony or
joint abnormality is seen. The patient has moderate
acromioclavicular osteoarthritis. Soft tissues are negative.
IMPRESSION: No acute abnormality.

## 2022-06-02 ENCOUNTER — Other Ambulatory Visit: Payer: Self-pay | Admitting: Family Medicine

## 2022-06-03 DIAGNOSIS — R55 Syncope and collapse: Secondary | ICD-10-CM

## 2022-06-25 DIAGNOSIS — R55 Syncope and collapse: Secondary | ICD-10-CM | POA: Diagnosis not present

## 2022-07-05 ENCOUNTER — Telehealth: Payer: Self-pay | Admitting: *Deleted

## 2022-07-05 DIAGNOSIS — R9431 Abnormal electrocardiogram [ECG] [EKG]: Secondary | ICD-10-CM

## 2022-07-05 DIAGNOSIS — I4819 Other persistent atrial fibrillation: Secondary | ICD-10-CM

## 2022-07-05 DIAGNOSIS — I472 Ventricular tachycardia, unspecified: Secondary | ICD-10-CM

## 2022-07-05 DIAGNOSIS — Z952 Presence of prosthetic heart valve: Secondary | ICD-10-CM

## 2022-07-05 DIAGNOSIS — R55 Syncope and collapse: Secondary | ICD-10-CM

## 2022-07-05 NOTE — Telephone Encounter (Signed)
The patients wife [name] (on [place] and takes calls for pt due to hard of hearing) has been notified of the result and verbalized understanding.  All questions (if any) were answered.   Pts wife is aware that I will place the referral to EP Dr. Quentin Ore in the system, and send a message to our EP [facility], to call them back and arrange this appt.  Wife states to let Doylene Canning know that she will be unable to take her call tomorrow between the hours of 10 am to 1 pm.  Wife aware I will endorse this information to [place]. Wife verbalized understanding and agrees with this plan.

## 2022-07-05 NOTE — Telephone Encounter (Signed)
-----   Message from Eileen Stanford, PA-C sent at 07/05/2022 12:39 PM EDT ----- Given evidence of pauses and VT runs in the setting of syncope, can we get him in with EP just for evaluation? Thanks

## 2022-07-20 ENCOUNTER — Other Ambulatory Visit: Payer: Self-pay | Admitting: Family Medicine

## 2022-07-20 NOTE — Telephone Encounter (Signed)
Last refill-by historical provider Last Ov-04/27/22  Next OV-07/28/22.     Okay for refill?

## 2022-07-28 ENCOUNTER — Ambulatory Visit: Payer: Medicare PPO | Admitting: Family Medicine

## 2022-07-28 ENCOUNTER — Encounter: Payer: Self-pay | Admitting: Family Medicine

## 2022-07-28 VITALS — BP 122/58 | HR 69 | Temp 97.9°F | Ht 69.0 in | Wt 158.1 lb

## 2022-07-28 DIAGNOSIS — F339 Major depressive disorder, recurrent, unspecified: Secondary | ICD-10-CM | POA: Diagnosis not present

## 2022-07-28 DIAGNOSIS — I5023 Acute on chronic systolic (congestive) heart failure: Secondary | ICD-10-CM | POA: Diagnosis not present

## 2022-07-28 DIAGNOSIS — R42 Dizziness and giddiness: Secondary | ICD-10-CM

## 2022-07-28 DIAGNOSIS — E1121 Type 2 diabetes mellitus with diabetic nephropathy: Secondary | ICD-10-CM | POA: Diagnosis not present

## 2022-07-28 DIAGNOSIS — I5030 Unspecified diastolic (congestive) heart failure: Secondary | ICD-10-CM

## 2022-07-28 LAB — BASIC METABOLIC PANEL
BUN: 29 mg/dL — ABNORMAL HIGH (ref 6–23)
CO2: 29 mEq/L (ref 19–32)
Calcium: 10 mg/dL (ref 8.4–10.5)
Chloride: 103 mEq/L (ref 96–112)
Creatinine, Ser: 1.98 mg/dL — ABNORMAL HIGH (ref 0.40–1.50)
GFR: 29.84 mL/min — ABNORMAL LOW (ref >60.00)
Glucose, Bld: 104 mg/dL — ABNORMAL HIGH (ref 70–99)
Potassium: 4.7 mEq/L (ref 3.5–5.1)
Sodium: 138 mEq/L (ref 135–145)

## 2022-07-28 LAB — POCT GLYCOSYLATED HEMOGLOBIN (HGB A1C): Hemoglobin A1C: 6.9 % — AB (ref 4.0–5.6)

## 2022-07-28 MED ORDER — FLUOXETINE HCL 20 MG PO CAPS: 20.0000 mg | ORAL_CAPSULE | Freq: Every day | ORAL | 3 refills | Status: DC

## 2022-07-28 NOTE — Patient Instructions (Signed)
Continue with daily weights  Consider increasing the Torsemide to twice daily for 3-4 days and then drop back to one daily.

## 2022-07-28 NOTE — Progress Notes (Signed)
Established Patient Office Visit  Subjective   Patient ID: [name], male    DOB: 1933-08-29  Age: 88 y.o. MRN: [id number]  Chief Complaint  Patient presents with   Follow-up   Cough    Productive with tan sputum x2-3 days    HPI   For medical follow-up with his wife.  His wife has been battling a respiratory illness now for a few weeks.  Demorris has had just couple days of occasional cough but no fever.  Occasionally productive and sometimes clear.  He has history of CAD, systolic heart failure, aortic stenosis, atrial fibrillation, hypertension, COPD, type 2 diabetes, chronic kidney disease, hyperlipidemia.  History of TAVR.  Continues to complain of fatigue and dizziness.  Frequent falls.  SPECT of cardiogenic syncope.  Followed closely by cardiology.  Last echo in August with EF 35 to 40% with normal functioning TAVR with a mean gradient of 2 mm.  Had a recent cardiac event monitor which showed some pauses and ventricular tachycardia runs and he has been set up to see EP  Denies any chest pains.  He continues to enjoy gardening and outdoor activities though he has been more limited because of his fatigue.  Currently on torsemide 20 mg daily.  His baseline weight is around 149-150 pounds and weight today of 158 pounds.  This has been slowly inching up  He has history of recurrent depression.  Has been on Prozac 10 mg daily.  Initially felt like there was some benefit.  He feels like he has not seen as much lately.  He would like to consider possible titration to 20 mg.  No suicidal ideation.  Other chronic medications include amlodipine, carvedilol, Plavix, Eliquis, Tresiba, Januvia, Protonix, K-Lor 10 mill equivalents, Crestor, Aldactone, torsemide.  Any recent hypoglycemic symptoms.  CBG fasting this morning 116.  Past Medical History:  Diagnosis Date   Aortic stenosis, moderate 09/20/2020   Arthritis    CAD (coronary artery disease)    a. Cath September 2015 [place] to the LAD  patent, SVG to PDA patent, SVG to posterior lateral patent, SVG to OM with a 90% in-stent restenosis at an anastomotic lesion. This was treated with angioplasty. b. cath 04/01/2015 95% ISR in SVG to OM treated with 2.75x24 Synergy DES postdilated to 3.32m, all other grafts patent   Cataract    surgery,B/L   CHF (congestive heart failure) (HCC)    Chronic kidney disease    nephrolithiasis   Diabetes mellitus    TYPE 2   GERD (gastroesophageal reflux disease)    Hernia    Hypercholesterolemia    Hypertension    Incontinence    hx  over 1 year,leaks without awareness   Other and unspecified diseases of appendix    PAF (paroxysmal atrial fibrillation) ([place]    in the setting of ischemia on 03/31/2015   Pancreatitis    [place]   prostate bx=Adenocarcinoam,gleason 3+4=7,PSA=6.75   Shortness of breath dyspnea    Ulcer    hx gastric   Past Surgical History:  Procedure Laterality Date   APPENDECTOMY     BACK SURGERY     CARDIAC CATHETERIZATION N/A 04/01/2015   Procedure: Left Heart Cath and Cors/Grafts Angiography;  Surgeon: JJettie Booze MD;  [facility];  Service: Cardiovascular;  Laterality: N/A;   CARDIAC CATHETERIZATION N/A 04/01/2015   Procedure: Coronary Stent Intervention;  Surgeon: JJettie Booze MD;  [facility];  Service: Cardiovascular;  Laterality: N/A;   CARDIOVERSION N/A 11/01/2019   Procedure: CARDIOVERSION;  Surgeon: Geralynn Rile, MD;  Location: [place];  Service: Cardiovascular;  Laterality: N/A;   CARPAL TUNNEL RELEASE  2004   both hands   CORONARY ARTERY BYPASS GRAFT     CORONARY BALLOON ANGIOPLASTY N/A 08/19/2021   Procedure: CORONARY BALLOON ANGIOPLASTY;  Surgeon: Burnell Blanks, MD;  Location: [place] [facility];  Service: Cardiovascular;  Laterality: N/A;   CORONARY STENT INTERVENTION N/A 09/23/2020   Procedure: CORONARY STENT INTERVENTION;  Surgeon: Sherren Mocha, MD;  Location:  [place] [facility];  Service: Cardiovascular;  Laterality: N/A;   CYSTOSCOPY  08/23/12   incomplete emoptying bladder   HERNIA REPAIR     incision and drainage of right chest abscess     INTRAOPERATIVE TRANSTHORACIC ECHOCARDIOGRAM Left 04/28/2021   Procedure: INTRAOPERATIVE TRANSTHORACIC ECHOCARDIOGRAM;  Surgeon: Burnell Blanks, MD;  Location: [place];  Service: Open Heart Surgery;  Laterality: Left;   LAPAROSCOPIC CHOLECYSTECTOMY  09/2009   LEFT HEART CATH AND CORS/GRAFTS ANGIOGRAPHY N/A 08/19/2021   Procedure: LEFT HEART CATH AND CORS/GRAFTS ANGIOGRAPHY;  Surgeon: Burnell Blanks, MD;  Location: [place] [facility];  Service: Cardiovascular;  Laterality: N/A;   LEFT HEART CATHETERIZATION WITH CORONARY ANGIOGRAM N/A 07/19/2014   Procedure: LEFT HEART CATHETERIZATION WITH CORONARY ANGIOGRAM;  Surgeon: Leonie Man, MD;  Location: [facility] LAB;  Service: Cardiovascular;  Laterality: N/A;   RECTAL SURGERY     RIGHT/LEFT HEART CATH AND CORONARY/GRAFT ANGIOGRAPHY N/A 09/23/2020   Procedure: RIGHT/LEFT HEART CATH AND CORONARY/GRAFT ANGIOGRAPHY;  Surgeon: Sherren Mocha, MD;  Location: [place] [facility];  Service: Cardiovascular;  Laterality: N/A;   ROBOT ASSISTED LAPAROSCOPIC RADICAL PROSTATECTOMY  2005   TRANSCATHETER AORTIC VALVE REPLACEMENT, TRANSFEMORAL Bilateral 04/28/2021   Procedure: TRANSCATHETER AORTIC VALVE REPLACEMENT, TRANSFEMORAL;  Surgeon: Burnell Blanks, MD;  Location: [place];  Service: Open Heart Surgery;  Laterality: Bilateral;   ULTRASOUND GUIDANCE FOR VASCULAR ACCESS Bilateral 04/28/2021   Procedure: ULTRASOUND GUIDANCE FOR VASCULAR ACCESS;  Surgeon: Burnell Blanks, MD;  Location: [facility];  Service: Open Heart Surgery;  Laterality: Bilateral;    reports that he quit smoking about 50 years ago. His smoking use included cigarettes. He has a 2.50 pack-year smoking history. He has never used smokeless tobacco. He reports that he does not drink alcohol and does  not use drugs. family history includes Cancer in his mother; Heart attack in his father. Allergies  Allergen Reactions   Codeine Other (See Comments)    Makes him feel goofy   Morphine Other (See Comments)    Nightmares and felt bad    Review of Systems  Constitutional:  Positive for malaise/fatigue. Negative for chills and fever.  Respiratory:  Positive for shortness of breath. Negative for cough.   Cardiovascular:  Negative for chest pain.  Neurological:  Positive for dizziness.      Objective:     BP (!) 122/58 (BP Location: Left Arm, Patient Position: Sitting, Cuff Size: Normal)   Pulse 69   Temp 97.9 F (36.6 C) (Oral)   Ht '5\' 9"'$  (1.753 m)   Wt 158 lb 1.6 oz (71.7 kg)   SpO2 96%   BMI 23.35 kg/m  BP Readings from Last 3 Encounters:  07/28/22 (!) 122/58  05/28/22 112/60  04/27/22 106/60   Wt Readings from Last 3 Encounters:  07/28/22 158 lb 1.6 oz (71.7 kg)  05/28/22 152 lb 9.6 oz (69.2 kg)  04/27/22 151 lb 9.6  oz (68.8 kg)      Physical Exam Vitals reviewed.  Constitutional:      Appearance: Normal appearance.  Cardiovascular:     Rate and Rhythm: Normal rate.  Pulmonary:     Effort: Pulmonary effort is normal.     Breath sounds: Normal breath sounds. No wheezing or rales.  Musculoskeletal:     Comments: No pitting edema at this time.  Neurological:     Mental Status: He is alert.      Results for orders placed or performed in visit on 09/98/33  Basic metabolic panel  Result Value Ref Range   Sodium 138 135 - 145 mEq/L   Potassium 4.7 3.5 - 5.1 mEq/L   Chloride 103 96 - 112 mEq/L   CO2 29 19 - 32 mEq/L   Glucose, Bld 104 (H) 70 - 99 mg/dL   BUN 29 (H) 6 - 23 mg/dL   Creatinine, Ser 1.98 (H) 0.40 - 1.50 mg/dL   GFR 29.84 (L) >60.00 mL/min   Calcium 10.0 8.4 - 10.5 mg/dL  POC HgB A1c  Result Value Ref Range   Hemoglobin A1C 6.9 (A) 4.0 - 5.6 %   HbA1c POC (<> result, manual entry)     HbA1c, POC (prediabetic range)     HbA1c, POC  (controlled diabetic range)        The ASCVD Risk score (Arnett DK, et al., 2019) failed to calculate for the following reasons:   The 2019 ASCVD risk score is only valid for ages 28 to 38   The patient has a prior MI or stroke diagnosis    Assessment & Plan:   #1 type 2 diabetes stable with A1c 6.9%.  Continue Tresiba 10 units once daily and low-dose Januvia 50 mg daily.  #2 history of systolic heart failure.  Patient has chronic dyspnea.  His weight has been inching up some lately.  No respiratory distress currently.  Consider titrating torsemide up to 20 mg twice daily for 3 to 4 days then dropping back to once daily.  Recommend continued daily weights to help monitor -Recheck basic metabolic panel  #3 history of recurrent depression.  Patient still has some frequent depression symptoms.  Low motivation.  We will try bumping his Prozac up to 20 mg daily.  #4 history of recurrent dizziness and syncope.  Suspected cardiogenic etiology.  Recent cardiac work-up as above.  EP consult pending.   No follow-ups on file.    Carolann Littler, MD

## 2022-07-30 ENCOUNTER — Other Ambulatory Visit: Payer: Self-pay | Admitting: Family Medicine

## 2022-08-09 ENCOUNTER — Other Ambulatory Visit: Payer: Self-pay | Admitting: Family Medicine

## 2022-08-09 ENCOUNTER — Other Ambulatory Visit: Payer: Self-pay | Admitting: Cardiology

## 2022-08-09 NOTE — Progress Notes (Deleted)
Electrophysiology Office Note:    Date:  08/09/2022   ID:  [name], DOB [birth year], MRN [id number]  PCP:  Shawn Post, MD  [facility] Cardiologist:  Shawn Breeding, MD  [facility] Electrophysiologist:  Shawn Epley, MD   Referring MD: [name], [place]*   Chief Complaint: Atrial fibrillation  History of Present Illness:    [name] is a 88 y.o. male who presents for an evaluation of atrial fibrillation at the request of Shawn Range, PA-C. Their medical history includes COPD, left bundle branch block, since coronary artery disease [name] four-vessel bypass surgery in 2009 with subsequent PCI's, CKD 3-4, diabetes, GERD, hypertension, hyperlipidemia, carotid artery disease, chronic diastolic heart failure, persistent atrial fibrillation on Eliquis, prostate cancer and severe aortic stenosis [name] TAVR in July 2022.  The patient saw [name] in clinic on May 28, 2022.  [name] reported a sudden syncopal episode while playing cards.  There is no prodrome.  He was unconscious for 1 to 2 minutes and then subsequently felt poorly all day.     Past Medical History:  Diagnosis Date   Aortic stenosis, moderate 09/20/2020   Arthritis    CAD (coronary artery disease)    a. Cath September 2015 LIMA to the LAD patent, SVG to PDA patent, SVG to posterior lateral patent, SVG to OM with a 90% in-stent restenosis at an anastomotic lesion. This was treated with angioplasty. b. cath 04/01/2015 95% ISR in SVG to OM treated with 2.75x24 Synergy DES postdilated to 3.54m, all other grafts patent   Cataract    surgery,B/L   CHF (congestive heart failure) (HCC)    Chronic kidney disease    nephrolithiasis   Diabetes mellitus    TYPE 2   GERD (gastroesophageal reflux disease)    Hernia    Hypercholesterolemia    Hypertension    Incontinence    hx  over 1 year,leaks without awareness   Other and unspecified diseases of appendix    PAF (paroxysmal atrial  fibrillation) ([facility]    in the setting of ischemia on 03/31/2015   Pancreatitis    [place]   prostate bx=Adenocarcinoam,gleason 3+4=7,PSA=6.75   Shortness of breath dyspnea    Ulcer    hx gastric    Past Surgical History:  Procedure Laterality Date   APPENDECTOMY     BACK SURGERY     CARDIAC CATHETERIZATION N/A 04/01/2015   Procedure: Left Heart Cath and Cors/Grafts Angiography;  Surgeon: JJettie Booze MD;  [facility];  Service: Cardiovascular;  Laterality: N/A;   CARDIAC CATHETERIZATION N/A 04/01/2015   Procedure: Coronary Stent Intervention;  Surgeon: JJettie Booze MD;  [facility];  Service: Cardiovascular;  Laterality: N/A;   CARDIOVERSION N/A 11/01/2019   Procedure: CARDIOVERSION;  Surgeon: OGeralynn Rile MD;  [place] [facility]  Service: Cardiovascular;  Laterality: N/A;   CARPAL TUNNEL RELEASE  2004   both hands   CORONARY ARTERY BYPASS GRAFT     CORONARY BALLOON ANGIOPLASTY N/A 08/19/2021   Procedure: CORONARY BALLOON ANGIOPLASTY;  Surgeon: MBurnell Blanks MD;  [place] [facility];  Service: Cardiovascular;  Laterality: N/A;   CORONARY STENT INTERVENTION N/A 09/23/2020   Procedure: CORONARY STENT INTERVENTION;  Surgeon: CSherren Mocha MD;  [facility];  Service: Cardiovascular;  Laterality: N/A;   CYSTOSCOPY  08/23/12   incomplete emoptying bladder   HERNIA REPAIR     incision  and drainage of right chest abscess     INTRAOPERATIVE TRANSTHORACIC ECHOCARDIOGRAM Left 04/28/2021   Procedure: INTRAOPERATIVE TRANSTHORACIC ECHOCARDIOGRAM;  Surgeon: Burnell Blanks, MD;  Location: [place];  Service: Open Heart Surgery;  Laterality: Left;   LAPAROSCOPIC CHOLECYSTECTOMY  09/2009   LEFT HEART CATH AND CORS/GRAFTS ANGIOGRAPHY N/A 08/19/2021   Procedure: LEFT HEART CATH AND CORS/GRAFTS ANGIOGRAPHY;  Surgeon: Burnell Blanks, MD;  Location: [place] [facility];   Service: Cardiovascular;  Laterality: N/A;   LEFT HEART CATHETERIZATION WITH CORONARY ANGIOGRAM N/A 07/19/2014   Procedure: LEFT HEART CATHETERIZATION WITH CORONARY ANGIOGRAM;  Surgeon: Leonie Man, MD;  Location: [facility];  Service: Cardiovascular;  Laterality: N/A;   RECTAL SURGERY     RIGHT/LEFT HEART CATH AND CORONARY/GRAFT ANGIOGRAPHY N/A 09/23/2020   Procedure: RIGHT/LEFT HEART CATH AND CORONARY/GRAFT ANGIOGRAPHY;  Surgeon: Sherren Mocha, MD;  Location: [place] [facility];  Service: Cardiovascular;  Laterality: N/A;   ROBOT ASSISTED LAPAROSCOPIC RADICAL PROSTATECTOMY  2005   TRANSCATHETER AORTIC VALVE REPLACEMENT, TRANSFEMORAL Bilateral 04/28/2021   Procedure: TRANSCATHETER AORTIC VALVE REPLACEMENT, TRANSFEMORAL;  Surgeon: Burnell Blanks, MD;  Location: [place];  Service: Open Heart Surgery;  Laterality: Bilateral;   ULTRASOUND GUIDANCE FOR VASCULAR ACCESS Bilateral 04/28/2021   Procedure: ULTRASOUND GUIDANCE FOR VASCULAR ACCESS;  Surgeon: Burnell Blanks, MD;  Location: [place];  Service: Open Heart Surgery;  Laterality: Bilateral;    Current Medications: No outpatient medications have been marked as taking for the 08/10/22 encounter (Appointment) with Shawn Epley, MD.     Allergies:   Codeine and Morphine   Social History   Socioeconomic History   Marital status: Married    Spouse name: Not on file   Number of children: 2   Years of education: Not on file   Highest education level: Not on file  Occupational History    Employer: RETIRED    Comment: Retired  Tobacco Use   Smoking status: Former    Packs/day: 0.50    Years: 5.00    Total pack years: 2.50    Types: Cigarettes    Quit date: 07/20/1972    Years since quitting: 50.0   Smokeless tobacco: Never   Tobacco comments:    quit s  Vaping Use   Vaping Use: Never used  Substance and Sexual Activity   Alcohol use: No   Drug use: No    Comment: quit smoking 40 years ago   Sexual activity:  Not Currently  Other Topics Concern   Not on file  Social History Narrative   Retired   Married      Social Determinants of Health   Financial Resource Strain: [place]  (03/29/2022)   Overall Financial Resource Strain (CARDIA)    Difficulty of Paying Living Expenses: Not hard at all  Food Insecurity: No Food Insecurity (03/29/2022)   Hunger Vital Sign    Worried About Running Out of Food in the Last Year: Never true    [place] in the Last Year: Never true  Transportation Needs: No Transportation Needs (04/29/2022)   PRAPARE - Hydrologist (Medical): No    Lack of Transportation (Non-Medical): No  Recent Concern: Transportation Needs - Unmet Transportation Needs (02/12/2022)   PRAPARE - Transportation    Lack of Transportation (Medical): Yes    Lack of Transportation (Non-Medical): Yes  Physical Activity: Inactive (03/29/2022)   Exercise Vital Sign    Days of Exercise per Week: 0  days    Minutes of Exercise per Session: 0 min  Stress: No Stress Concern Present (03/29/2022)   [name]    Feeling of Stress : Not at all  Social Connections: [place] (03/02/2021)   Social Connection and Isolation Panel [NHANES]    Frequency of Communication with Friends and Family: More than three times a week    Frequency of Social Gatherings with Friends and Family: More than three times a week    Attends Religious Services: More than 4 times per year    Active Member of [employer] or Organizations: Yes    Attends Archivist Meetings: 1 to 4 times per year    Marital Status: Married     Family History: The patient's family history includes Cancer in his mother; Heart attack in his father.  ROS:   Please see the history of present illness.    All other systems reviewed and are negative.  EKGs/Labs/Other Studies Reviewed:    The following studies were reviewed  today:  July 01, 2022 ZIO monitor personally reviewed Heart rate 40-1 97, average 70 Occasional ventricular ectopy, 1.6% 24 VT episodes, longest 15.5 seconds with an average rate of 154 bpm 100% A-fib, average rate 70 One 4.8-second pause occurring just before noon   EKG on February 08, 2022 shows atrial fibrillation, left bundle type QRS widening  May 28, 2022 echo EF 35 to 40%.  RV function moderately reduced.  Dilated atria.  Moderate MR.    Recent Labs: 08/18/2021: TSH 2.079 02/01/2022: Pro B Natriuretic peptide (BNP) 1,846.0 02/05/2022: B Natriuretic Peptide 1,573.0 02/06/2022: ALT 17 02/08/2022: Magnesium 2.0 03/17/2022: Hemoglobin 12.7; Platelets 118.0 07/28/2022: BUN 29; Creatinine, Ser 1.98; Potassium 4.7; Sodium 138  Recent Lipid Panel    Component Value Date/Time   CHOL 122 08/19/2021 0234   CHOL 97 (L) 01/01/2021 0848   TRIG 68 08/19/2021 0234   TRIG 306 (HH) 09/27/2006 1154   HDL 35 (L) 08/19/2021 0234   HDL 33 (L) 01/01/2021 0848   CHOLHDL 3.5 08/19/2021 0234   VLDL 14 08/19/2021 0234   LDLCALC 73 08/19/2021 0234   LDLCALC 46 01/01/2021 0848   LDLDIRECT 143.3 11/23/2007 0000    Physical Exam:    VS:  There were no vitals taken for this visit.    Wt Readings from Last 3 Encounters:  07/28/22 158 lb 1.6 oz (71.7 kg)  05/28/22 152 lb 9.6 oz (69.2 kg)  04/27/22 151 lb 9.6 oz (68.8 kg)     GEN: *** Well nourished, well developed in no acute distress HEENT: Normal NECK: No JVD; No carotid bruits LYMPHATICS: No lymphadenopathy CARDIAC: ***RRR, no murmurs, rubs, gallops RESPIRATORY:  Clear to auscultation without rales, wheezing or rhonchi  ABDOMEN: Soft, non-tender, non-distended MUSCULOSKELETAL:  No edema; No deformity  SKIN: Warm and dry NEUROLOGIC:  Alert and oriented x 3 PSYCHIATRIC:  Normal affect       ASSESSMENT:    1. Syncope and collapse   2. Ventricular tachycardia ([place])   3. Tachycardia-bradycardia syndrome ([place])   4. Chronic  systolic HF (heart failure) (HCC)   5. Persistent atrial fibrillation ([place])   6. Nonrheumatic aortic valve stenosis    PLAN:    In order of problems listed above:  #Syncope Unclear exact cause but highly suspicious for an arrhythmic event.  The patient has a fairly long episode of ventricular tachycardia on his monitor.  Treatment of this has been limited due to his  bradycardia also seen on the monitor.  Another possibility is the cause would be a bradycardic episode given the 4.8-second pause noted on the ZIO monitor.  I have recommended permanent pacing tell prevent bradycardia and also facilitate treatment of the tachycardia/VT with nodal blockers.  Risks, benefits, alternatives to PPM implantation were discussed in detail with the patient today. The patient understands that the risks include but are not limited to bleeding, infection, pneumothorax, perforation, tamponade, vascular damage, renal failure, MI, stroke, death, and lead dislodgement and wishes to proceed.  We will therefore schedule device implantation at the next available time.  Plan for STJ CRT-P/LBB-area lead with no atrial lead.  Hold Eliquis 48 hours prior to pacemaker implant  #VT Continue Coreg with plans for up titration after pacemaker implanted.  #Chronic systolic heart failure #Severe aortic stenosis Management per general cardiology/structural teams.    Medication Adjustments/Labs and Tests Ordered: Current medicines are reviewed at length with the patient today.  Concerns regarding medicines are outlined above.  No orders of the defined types were placed in this encounter.  No orders of the defined types were placed in this encounter.    Signed, Hilton Cork. Quentin Ore, MD, [facility], [facility] 08/09/2022 8:15 PM    Electrophysiology [facility]

## 2022-08-10 ENCOUNTER — Ambulatory Visit: Payer: Medicare PPO | Attending: Cardiology | Admitting: Cardiology

## 2022-08-10 ENCOUNTER — Encounter: Payer: Self-pay | Admitting: Cardiology

## 2022-08-10 VITALS — BP 112/64 | HR 78 | Ht 69.0 in | Wt 161.0 lb

## 2022-08-10 DIAGNOSIS — I472 Ventricular tachycardia, unspecified: Secondary | ICD-10-CM

## 2022-08-10 DIAGNOSIS — R55 Syncope and collapse: Secondary | ICD-10-CM

## 2022-08-10 DIAGNOSIS — I495 Sick sinus syndrome: Secondary | ICD-10-CM | POA: Diagnosis not present

## 2022-08-10 DIAGNOSIS — I4819 Other persistent atrial fibrillation: Secondary | ICD-10-CM

## 2022-08-10 DIAGNOSIS — I350 Nonrheumatic aortic (valve) stenosis: Secondary | ICD-10-CM

## 2022-08-10 DIAGNOSIS — I5022 Chronic systolic (congestive) heart failure: Secondary | ICD-10-CM

## 2022-08-10 NOTE — Progress Notes (Signed)
Electrophysiology Office Note:    Date:  08/10/2022   ID:  [name], DOB [birth year], MRN [id number]  PCP:  [name], MD  [facility] Cardiologist:  [name], MD  [facility] Electrophysiologist:  Vickie Epley, MD   Referring MD: Eileen Stanford, [place]*   Chief Complaint: Atrial fibrillation  History of Present Illness:    [name] is a 88 y.o. male who presents for an evaluation of atrial fibrillation at the request of Nell Range, PA-C. Their medical history includes COPD, left bundle branch block, since coronary artery disease post four-vessel bypass surgery in 2009 with subsequent PCI's, CKD 3-4, diabetes, GERD, hypertension, hyperlipidemia, carotid artery disease, chronic diastolic heart failure, persistent atrial fibrillation on Eliquis, prostate cancer and severe aortic stenosis post TAVR in July 2022.  [name] in clinic on May 28, 2022.  [name] reported a sudden syncopal episode while playing cards.  There is no prodrome.  He was unconscious for 1 to 2 minutes and then subsequently felt poorly all day.  Today, the patient states that he is doing fine, he presents with his wife. He states that he had a syncopal episode while playing his card game, and fell over the table. He states that he wasn't out long but he fell, but felt really bad just beforehand.   He is concerned with falling, he has fallen 8 to 10 times in the last two weeks. He believes his falling is due to balance issues and doesn't think he passes out. He to clinic with an abrasion on his left wrist due to a fall which bled during the visit and required a bandage.  He states that when he gets small abrasions on his extremities it takes them from one to three days to stop bleeding.  He denies any chest pain, shortness of breath, or peripheral edema. No lightheadedness, headaches, orthopnea, or PND.    Past Medical History:  Diagnosis Date    Aortic stenosis, moderate 09/20/2020   Arthritis    CAD (coronary artery disease)    a. Cath September 2015 LIMA to the LAD patent, SVG to PDA patent, SVG to posterior lateral patent, SVG to OM with a 90% in-stent restenosis at an anastomotic lesion. This was treated with angioplasty. b. cath 04/01/2015 95% ISR in SVG to OM treated with 2.75x24 Synergy DES postdilated to 3.63m, all other grafts patent   Cataract    surgery,B/L   CHF (congestive heart failure) (HCC)    Chronic kidney disease    nephrolithiasis   Diabetes mellitus    TYPE 2   GERD (gastroesophageal reflux disease)    Hernia    Hypercholesterolemia    Hypertension    Incontinence    hx  over 1 year,leaks without awareness   Other and unspecified diseases of appendix    PAF (paroxysmal atrial fibrillation) ([place]    in the setting of ischemia on 03/31/2015   Pancreatitis    [place]   prostate bx=Adenocarcinoam,gleason 3+4=7,PSA=6.75   Shortness of breath dyspnea    Ulcer    hx gastric    Past Surgical History:  Procedure Laterality Date   APPENDECTOMY     BACK SURGERY     CARDIAC CATHETERIZATION N/A 04/01/2015   Procedure: Left Heart Cath and Cors/Grafts Angiography;  Surgeon: JJettie Booze MD;  [place] [place] [facility];  Service: Cardiovascular;  Laterality: N/A;   CARDIAC CATHETERIZATION N/A 04/01/2015  Procedure: Coronary Stent Intervention;  Surgeon: Jettie Booze, MD;  Location: [place] [facility];  Service: Cardiovascular;  Laterality: N/A;   CARDIOVERSION N/A 11/01/2019   Procedure: CARDIOVERSION;  Surgeon: Geralynn Rile, MD;  Location: [place];  Service: Cardiovascular;  Laterality: N/A;   CARPAL TUNNEL RELEASE  2004   both hands   CORONARY ARTERY BYPASS GRAFT     CORONARY BALLOON ANGIOPLASTY N/A 08/19/2021   Procedure: CORONARY BALLOON ANGIOPLASTY;  Surgeon: Burnell Blanks, MD;  Location: [place] [facility];  Service: Cardiovascular;  Laterality: N/A;    CORONARY STENT INTERVENTION N/A 09/23/2020   Procedure: CORONARY STENT INTERVENTION;  Surgeon: Sherren Mocha, MD;  Location: [place] [facility];  Service: Cardiovascular;  Laterality: N/A;   CYSTOSCOPY  08/23/12   incomplete emoptying bladder   HERNIA REPAIR     incision and drainage of right chest abscess     INTRAOPERATIVE TRANSTHORACIC ECHOCARDIOGRAM Left 04/28/2021   Procedure: INTRAOPERATIVE TRANSTHORACIC ECHOCARDIOGRAM;  Surgeon: Burnell Blanks, MD;  Location: [place];  Service: Open Heart Surgery;  Laterality: Left;   LAPAROSCOPIC CHOLECYSTECTOMY  09/2009   LEFT HEART CATH AND CORS/GRAFTS ANGIOGRAPHY N/A 08/19/2021   Procedure: LEFT HEART CATH AND CORS/GRAFTS ANGIOGRAPHY;  Surgeon: Burnell Blanks, MD;  Location: [place] [facility];  Service: Cardiovascular;  Laterality: N/A;   LEFT HEART CATHETERIZATION WITH CORONARY ANGIOGRAM N/A 07/19/2014   Procedure: LEFT HEART CATHETERIZATION WITH CORONARY ANGIOGRAM;  Surgeon: Leonie Man, MD;  Location[facility];  Service: Cardiovascular;  Laterality: N/A;   RECTAL SURGERY     RIGHT/LEFT HEART CATH AND CORONARY/GRAFT ANGIOGRAPHY N/A 09/23/2020   Procedure: RIGHT/LEFT HEART CATH AND CORONARY/GRAFT ANGIOGRAPHY;  Surgeon: Sherren Mocha, MD;  Location: [place] [facility];  Service: Cardiovascular;  Laterality: N/A;   ROBOT ASSISTED LAPAROSCOPIC RADICAL PROSTATECTOMY  2005   TRANSCATHETER AORTIC VALVE REPLACEMENT, TRANSFEMORAL Bilateral 04/28/2021   Procedure: TRANSCATHETER AORTIC VALVE REPLACEMENT, TRANSFEMORAL;  Surgeon: Burnell Blanks, MD;  Location: [place];  Service: Open Heart Surgery;  Laterality: Bilateral;   ULTRASOUND GUIDANCE FOR VASCULAR ACCESS Bilateral 04/28/2021   Procedure: ULTRASOUND GUIDANCE FOR VASCULAR ACCESS;  Surgeon: Burnell Blanks, MD;  Location: [place];  Service: Open Heart Surgery;  Laterality: Bilateral;    Current Medications: Current Meds  Medication Sig   albuterol (VENTOLIN HFA) 108  (90 Base) MCG/ACT inhaler Inhale 2 puffs into the lungs every 6 (six) hours as needed for wheezing or shortness of breath.   amLODipine (NORVASC) 5 MG tablet TAKE 1 TABLET BY MOUTH EVERY DAY   BD PEN NEEDLE NANO 2ND GEN 32G X 4 MM MISC USE AS DIRECTED.   blood glucose meter kit and supplies KIT Dispense based on patient and insurance preference. Use up to four times daily as directed. (FOR ICD-9 250.00, 250.01).   budesonide-formoterol (SYMBICORT) 160-4.5 MCG/ACT inhaler Inhale 2 puffs into the lungs in the morning and at bedtime. (Patient taking differently: Inhale 2 puffs into the lungs 2 (two) times daily as needed (sob/wheezing).)   Carboxymethylcellul-Glycerin (LUBRICATING EYE DROPS OP) Place 1 drop into both eyes daily as needed (dry eyes).   clopidogrel (PLAVIX) 75 MG tablet TAKE 1 TABLET BY MOUTH DAILY WITH BREAKFAST.   Cyanocobalamin (VITAMIN B-12 PO) Take 1 tablet by mouth daily.   docusate sodium (STOOL SOFTENER) 100 MG capsule Take 1 capsule (100 mg total) by mouth 2 (two) times daily as needed for mild constipation.   ELIQUIS 2.5 MG TABS tablet TAKE 1 TABLET BY  MOUTH TWICE A DAY   FLUoxetine (PROZAC) 20 MG capsule Take 1 capsule (20 mg total) by mouth daily.   Homeopathic Products [facility] RELIEF EX) Apply 1 application topically daily as needed (knee pain).   hydrocortisone 2.5 % cream Apply 1 application topically daily as needed (facial breakouts).   Insulin Degludec (TRESIBA) 100 UNIT/ML SOLN Inject 10 Units into the skin daily.   JANUVIA 50 MG tablet TAKE 1 TABLET BY MOUTH EVERY DAY   Multiple Vitamins-Minerals (ICAPS AREDS 2 PO) Take 1 capsule by mouth daily.   nitroGLYCERIN (NITROSTAT) 0.4 MG SL tablet PLACE 1 TABLET UNDER THE TONGUE EVERY 5 MINUTES X 3 DOSES AS NEEDED FOR CHEST PAIN   pantoprazole (PROTONIX) 40 MG tablet Take 1 tablet (40 mg total) by mouth daily.   potassium chloride (KLOR-CON) 10 MEQ tablet TAKE 1 TABLET BY MOUTH EVERY DAY   rosuvastatin (CRESTOR) 20 MG  tablet Take 1 tablet (20 mg total) by mouth daily.   spironolactone (ALDACTONE) 25 MG tablet TAKE 1 TABLET (25 MG TOTAL) BY MOUTH DAILY.   torsemide (DEMADEX) 20 MG tablet TAKE 1 TABLET BY MOUTH EVERY DAY   TRESIBA FLEXTOUCH 100 UNIT/ML FlexTouch Pen INJECT 10 UNITS INTO THE SKIN DAILY   [DISCONTINUED] carvedilol (COREG) 6.25 MG tablet TAKE 1 TABLET (6.25 MG TOTAL) BY MOUTH 2 (TWO) TIMES DAILY WITH A MEAL.     Allergies:   Codeine and Morphine   Social History   Socioeconomic History   Marital status: Married    Spouse name: Not on file   Number of children: 2   Years of education: Not on file   Highest education level: Not on file  Occupational History    Employer: RETIRED    Comment: Retired  Tobacco Use   Smoking status: Former    Packs/day: 0.50    Years: 5.00    Total pack years: 2.50    Types: Cigarettes    Quit date: 07/20/1972    Years since quitting: 50.0   Smokeless tobacco: Never   Tobacco comments:    quit s  Vaping Use   Vaping Use: Never used  Substance and Sexual Activity   Alcohol use: No   Drug use: No    Comment: quit smoking 40 years ago   Sexual activity: Not Currently  Other Topics Concern   Not on file  Social History Narrative   Retired   Married      Social Determinants of Health   Financial Resource Strain: [place]  (03/29/2022)   Overall Financial Resource Strain (CARDIA)    Difficulty of Paying Living Expenses: Not hard at all  Food Insecurity: No Food Insecurity (03/29/2022)   Hunger Vital Sign    Worried About Running Out of Food in the Last Year: Never true    [place] in the Last Year: Never true  Transportation Needs: No Transportation Needs (04/29/2022)   PRAPARE - Hydrologist (Medical): No    Lack of Transportation (Non-Medical): No  Recent Concern: Transportation Needs - Unmet Transportation Needs (02/12/2022)   PRAPARE - Transportation    Lack of Transportation (Medical): Yes    Lack of  Transportation (Non-Medical): Yes  Physical Activity: Inactive (03/29/2022)   Exercise Vital Sign    Days of Exercise per Week: 0 days    Minutes of Exercise per Session: 0 min  Stress: No Stress Concern Present (03/29/2022)   [place]  Feeling of Stress : Not at all  Social Connections: Socially Integrated (03/02/2021)   Social Connection and Isolation Panel [NHANES]    Frequency of Communication with Friends and Family: More than three times a week    Frequency of Social Gatherings with Friends and Family: More than three times a week    Attends Religious Services: More than 4 times per year    Active Member of [employer] or Organizations: Yes    Attends Archivist Meetings: 1 to 4 times per year    Marital Status: Married     Family History: The patient's family history includes Cancer in his mother; Heart attack in his father.  ROS:   Please see the history of present illness.    (+)Syncope (+)Loss of balance (+)Palpitation (+) Easy bleeding (+) Mechanical Falls (+) Dizziness All other systems reviewed and are negative.  EKGs/Labs/Other Studies Reviewed:    The following studies were reviewed today:  July 01, 2022 ZIO monitor personally reviewed Heart rate 40-1 97, average 70 Occasional ventricular ectopy, 1.6% 24 VT episodes, longest 15.5 seconds with an average rate of 154 bpm 100% A-fib, average rate 70 One 4.8-second pause occurring just before noon  EKG on February 08, 2022 shows atrial fibrillation, left bundle type QRS widening  May 28, 2022 echo EF 35 to 40%.  RV function moderately reduced.  Dilated atria.  Moderate MR.  Recent Labs: 08/18/2021: TSH 2.079 02/01/2022: Pro B Natriuretic peptide (BNP) 1,846.0 02/05/2022: B Natriuretic Peptide 1,573.0 02/06/2022: ALT 17 02/08/2022: Magnesium 2.0 03/17/2022: Hemoglobin 12.7; Platelets 118.0 07/28/2022: BUN 29; Creatinine, Ser 1.98;  Potassium 4.7; Sodium 138  Recent Lipid Panel    Component Value Date/Time   CHOL 122 08/19/2021 0234   CHOL 97 (L) 01/01/2021 0848   TRIG 68 08/19/2021 0234   TRIG 306 (HH) 09/27/2006 1154   HDL 35 (L) 08/19/2021 0234   HDL 33 (L) 01/01/2021 0848   CHOLHDL 3.5 08/19/2021 0234   VLDL 14 08/19/2021 0234   LDLCALC 73 08/19/2021 0234   LDLCALC 46 01/01/2021 0848   LDLDIRECT 143.3 11/23/2007 0000    Physical Exam:    VS:  BP 112/64   Pulse 78   Ht _0  (1.753 m)   Wt 161 lb (73 kg)   SpO2 99%   BMI 23.78 kg/m     Wt Readings from Last 3 Encounters:  08/10/22 161 lb (73 kg)  07/28/22 158 lb 1.6 oz (71.7 kg)  05/28/22 152 lb 9.6 oz (69.2 kg)     GEN: Well nourished, well developed in no acute distress HEENT: Normal NECK: No JVD; No carotid bruits LYMPHATICS: No lymphadenopathy CARDIAC: irregularly irregular, no murmurs, rubs, gallops RESPIRATORY:  Clear to auscultation without rales, wheezing or rhonchi  ABDOMEN: Soft, non-tender, non-distended MUSCULOSKELETAL:  No edema; No deformity  SKIN: Warm and dry, abrasion on left wrist NEUROLOGIC:  Alert and oriented x 3 PSYCHIATRIC:  Normal affect    ASSESSMENT:    1. Syncope and collapse   2. Ventricular tachycardia ([place])   3. Tachycardia-bradycardia syndrome ([place])   4. Chronic systolic HF (heart failure) (HCC)   5. Persistent atrial fibrillation ([place])   6. Nonrheumatic aortic valve stenosis    PLAN:    In order of problems listed above:  #Syncope Unclear exact cause but highly suspicious for an arrhythmic event.  The patient has a fairly long episode of ventricular tachycardia on his monitor.  Treatment of this has been limited due to his bradycardia also  seen on the monitor.  Another possibility is the cause would be a bradycardic episode given the 4.8-second pause noted on the ZIO monitor.    I do not think we have enough evidence yet to proceed with pacing/ICD. I would like to implant a loop recorder for ongoing  rhythm monitoring. I have discussed the procedure and he wishes to proceed. Will plan for a MDT loop.  #NSVT Continue Coreg. Loop as above.  #Chronic systolic heart failure #Severe aortic stenosis Management per general cardiology/structural teams.     Medication Adjustments/Labs and Tests Ordered: Current medicines are reviewed at length with the patient today.  Concerns regarding medicines are outlined above.  No orders of the defined types were placed in this encounter.  No orders of the defined types were placed in this encounter.  I,Coren O'Brien,acting as a Education administrator for Vickie Epley, MD.,have documented all relevant documentation on the behalf of Vickie Epley, MD,as directed by  Vickie Epley, MD while in the presence of Vickie Epley, MD.  I, Vickie Epley, MD, have reviewed all documentation for this visit. The documentation on 08/10/22 for the exam, diagnosis, procedures, and orders are all accurate and complete.   Signed, Hilton Cork. Quentin Ore, MD, [facility], [facility] 08/10/2022 9:21 AM    Electrophysiology [facility]

## 2022-08-10 NOTE — Patient Instructions (Signed)
Medication Instructions:  none *If you need a refill on your cardiac medications before your next appointment, please call your pharmacy*   Lab Work: none If you have labs (blood work) drawn today and your tests are completely normal, you will receive your results only by: [place] (if you have MyChart) OR A paper copy in the mail If you have any lab test that is abnormal or we need to change your treatment, we will call you to review the results.   Testing/Procedures: none   Follow-Up: At [facility], you and your health needs are our priority.  As part of our continuing mission to provide you with exceptional heart care, we have created designated Provider Care Teams.  These Care Teams include your primary Cardiologist (physician) and Advanced Practice Providers (APPs -  Physician Assistants and Nurse Practitioners) who all work together to provide you with the care you need, when you need it.  We recommend signing up for the patient portal called "MyChart".  Sign up information is provided on this After Visit Summary.  MyChart is used to connect with patients for Virtual Visits (Telemedicine).  Patients are able to view lab/test results, encounter notes, upcoming appointments, etc.  Non-urgent messages can be sent to your provider as well.   To learn more about what you can do with MyChart, go to [email or web address].    Your next appointment:   Next available for a loop implant in office (MDT device)  The format for your next appointment:   In Person  Provider:   Lars Mage, MD    Other Instructions   Important Information About Sugar

## 2022-08-23 ENCOUNTER — Emergency Department (HOSPITAL_COMMUNITY): Payer: Medicare PPO

## 2022-08-23 ENCOUNTER — Emergency Department (HOSPITAL_COMMUNITY)
Admission: EM | Admit: 2022-08-23 | Discharge: 2022-08-23 | Disposition: A | Discharge status: Home / Self Care | Payer: Medicare PPO | Attending: Emergency Medicine | Admitting: Emergency Medicine

## 2022-08-23 ENCOUNTER — Other Ambulatory Visit: Payer: Self-pay

## 2022-08-23 DIAGNOSIS — N189 Chronic kidney disease, unspecified: Secondary | ICD-10-CM | POA: Diagnosis not present

## 2022-08-23 DIAGNOSIS — Z79899 Other long term (current) drug therapy: Secondary | ICD-10-CM | POA: Diagnosis not present

## 2022-08-23 DIAGNOSIS — Z20822 Contact with and (suspected) exposure to covid-19: Secondary | ICD-10-CM | POA: Diagnosis not present

## 2022-08-23 DIAGNOSIS — I130 Hypertensive heart and chronic kidney disease with heart failure and stage 1 through stage 4 chronic kidney disease, or unspecified chronic kidney disease: Secondary | ICD-10-CM | POA: Diagnosis not present

## 2022-08-23 DIAGNOSIS — R079 Chest pain, unspecified: Secondary | ICD-10-CM | POA: Diagnosis not present

## 2022-08-23 DIAGNOSIS — I2510 Atherosclerotic heart disease of native coronary artery without angina pectoris: Secondary | ICD-10-CM | POA: Insufficient documentation

## 2022-08-23 DIAGNOSIS — R0602 Shortness of breath: Secondary | ICD-10-CM | POA: Diagnosis not present

## 2022-08-23 DIAGNOSIS — I509 Heart failure, unspecified: Secondary | ICD-10-CM | POA: Diagnosis not present

## 2022-08-23 DIAGNOSIS — I50813 Acute on chronic right heart failure: Secondary | ICD-10-CM | POA: Diagnosis not present

## 2022-08-23 DIAGNOSIS — R059 Cough, unspecified: Secondary | ICD-10-CM | POA: Diagnosis not present

## 2022-08-23 DIAGNOSIS — Z794 Long term (current) use of insulin: Secondary | ICD-10-CM | POA: Diagnosis not present

## 2022-08-23 DIAGNOSIS — Z7901 Long term (current) use of anticoagulants: Secondary | ICD-10-CM | POA: Insufficient documentation

## 2022-08-23 DIAGNOSIS — I110 Hypertensive heart disease with heart failure: Secondary | ICD-10-CM | POA: Diagnosis not present

## 2022-08-23 LAB — TROPONIN I (HIGH SENSITIVITY)
Troponin I (High Sensitivity): 57 ng/L — ABNORMAL HIGH (ref <18)
Troponin I (High Sensitivity): 56 ng/L — ABNORMAL HIGH (ref <18)

## 2022-08-23 LAB — CBC
HCT: 37.3 % — ABNORMAL LOW (ref 39.0–52.0)
Hemoglobin: 11.9 g/dL — ABNORMAL LOW (ref 13.0–17.0)
MCH: 26.7 pg (ref 26.0–34.0)
MCHC: 31.9 g/dL (ref 30.0–36.0)
MCV: 83.8 fL (ref 80.0–100.0)
Platelets: 163 10*3/uL (ref 150–400)
RBC: 4.45 MIL/uL (ref 4.22–5.81)
RDW: 15.2 % (ref 11.5–15.5)
WBC: 10.1 10*3/uL (ref 4.0–10.5)
nRBC: 0 % (ref 0.0–0.2)

## 2022-08-23 LAB — BASIC METABOLIC PANEL
Anion gap: 13 (ref 5–15)
BUN: 22 mg/dL (ref 8–23)
CO2: 21 mmol/L — ABNORMAL LOW (ref 22–32)
Calcium: 9.4 mg/dL (ref 8.9–10.3)
Chloride: 100 mmol/L (ref 98–111)
Creatinine, Ser: 1.73 mg/dL — ABNORMAL HIGH (ref 0.61–1.24)
GFR, Estimated: 38 mL/min — ABNORMAL LOW (ref >60)
Glucose, Bld: 206 mg/dL — ABNORMAL HIGH (ref 70–99)
Potassium: 4.3 mmol/L (ref 3.5–5.1)
Sodium: 134 mmol/L — ABNORMAL LOW (ref 135–145)

## 2022-08-23 LAB — RESP PANEL BY RT-PCR (FLU A&B, COVID) ARPGX2
Influenza A by PCR: NEGATIVE
Influenza B by PCR: NEGATIVE
SARS Coronavirus 2 by RT PCR: NEGATIVE

## 2022-08-23 LAB — BRAIN NATRIURETIC PEPTIDE: B Natriuretic Peptide: 1375.4 pg/mL — ABNORMAL HIGH (ref 0.0–100.0)

## 2022-08-23 MED ORDER — FUROSEMIDE 10 MG/ML IJ SOLN
40.0000 mg | Freq: Once | INTRAMUSCULAR | Status: AC
  Administered 2022-08-23: 40 mg via INTRAVENOUS
  Filled 2022-08-23: qty 4

## 2022-08-23 MED ORDER — BENZONATATE 100 MG PO CAPS: 100.0000 mg | ORAL_CAPSULE | Freq: Three times a day (TID) | ORAL | 0 refills | Status: DC

## 2022-08-23 NOTE — ED Provider Notes (Signed)
[facility] EMERGENCY DEPARTMENT Provider Note   CSN: [id number] Arrival date & time: 08/23/22  6568     History  Chief Complaint  Patient presents with   Chest Pain   Shortness of Breath   Cough    [name] is a 88 y.o. male.  Patient here with cough, shortness of breath, chest pain.  Pain in his chest is only when he is coughing.  He has shortness of breath with exertion.  Feels like he is coughing up some phlegm.  Denies any fevers or chills.  Denies any weakness or numbness or tingling.  Denies any vision changes or vision loss.  Patient with history of heart failure, hypertension, chronic kidney disease, atrial fibrillation on Eliquis.  The history is provided by the patient.       Home Medications Prior to Admission medications   Medication Sig Start Date End Date Taking? Authorizing Provider  benzonatate (TESSALON) 100 MG capsule Take 1 capsule (100 mg total) by mouth every 8 (eight) hours. 08/23/22  Yes Kemoni Ortega, DO  albuterol (VENTOLIN HFA) 108 (90 Base) MCG/ACT inhaler Inhale 2 puffs into the lungs every 6 (six) hours as needed for wheezing or shortness of breath.    [provider]  amLODipine (NORVASC) 5 MG tablet TAKE 1 TABLET BY MOUTH EVERY DAY 08/09/22   Burchette, Alinda Sierras, MD  BD PEN NEEDLE NANO 2ND GEN 32G X 4 MM MISC USE AS DIRECTED. 06/02/22   Burchette, Alinda Sierras, MD  blood glucose meter kit and supplies KIT Dispense based on patient and insurance preference. Use up to four times daily as directed. (FOR ICD-9 250.00, 250.01). 09/08/20   Guilford Shi, MD  budesonide-formoterol (SYMBICORT) 160-4.5 MCG/ACT inhaler Inhale 2 puffs into the lungs in the morning and at bedtime. Patient taking differently: Inhale 2 puffs into the lungs 2 (two) times daily as needed (sob/wheezing). 08/07/20   Spero Geralds, MD  Carboxymethylcellul-Glycerin (LUBRICATING EYE DROPS OP) Place 1 drop into both eyes daily as needed (dry eyes).     [provider]  carvedilol (COREG) 6.25 MG tablet TAKE 1 TABLET BY MOUTH 2 TIMES DAILY WITH A MEAL. 08/10/22   [name], MD  clopidogrel (PLAVIX) 75 MG tablet TAKE 1 TABLET BY MOUTH DAILY WITH BREAKFAST. 04/23/22   [name], MD  Cyanocobalamin (VITAMIN B-12 PO) Take 1 tablet by mouth daily.    [provider]  docusate sodium (STOOL SOFTENER) 100 MG capsule Take 1 capsule (100 mg total) by mouth 2 (two) times daily as needed for mild constipation. 07/28/19   Ivor Costa, MD  ELIQUIS 2.5 MG TABS tablet TAKE 1 TABLET BY MOUTH TWICE A DAY 11/30/21   Kathyrn Drown D, NP  FLUoxetine (PROZAC) 20 MG capsule Take 1 capsule (20 mg total) by mouth daily. 07/28/22   Burchette, Alinda Sierras, MD  Homeopathic Products [facility] RELIEF EX) Apply 1 application topically daily as needed (knee pain).    [provider]  hydrocortisone 2.5 % cream Apply 1 application topically daily as needed (facial breakouts).    [provider]  Insulin Degludec (TRESIBA) 100 UNIT/ML SOLN Inject 10 Units into the skin daily. 09/30/21   Burchette, Alinda Sierras, MD  JANUVIA 50 MG tablet TAKE 1 TABLET BY MOUTH EVERY DAY 04/19/22   Burchette, Alinda Sierras, MD  Multiple Vitamins-Minerals (ICAPS AREDS 2 PO) Take 1 capsule by mouth daily.    [provider]  nitroGLYCERIN (NITROSTAT) 0.4 MG SL tablet PLACE 1  TABLET UNDER THE TONGUE EVERY 5 MINUTES X 3 DOSES AS NEEDED FOR CHEST PAIN 01/29/22   [name], MD  pantoprazole (PROTONIX) 40 MG tablet Take 1 tablet (40 mg total) by mouth daily. 04/26/22   Burchette, Alinda Sierras, MD  potassium chloride (KLOR-CON) 10 MEQ tablet TAKE 1 TABLET BY MOUTH EVERY DAY 07/30/22   Burchette, Alinda Sierras, MD  rosuvastatin (CRESTOR) 20 MG tablet Take 1 tablet (20 mg total) by mouth daily. 04/26/22   Burchette, Alinda Sierras, MD  spironolactone (ALDACTONE) 25 MG tablet TAKE 1 TABLET (25 MG TOTAL) BY MOUTH DAILY. 12/31/21   [name], MD  torsemide (DEMADEX) 20 MG tablet TAKE 1  TABLET BY MOUTH EVERY DAY 07/20/22   Burchette, Alinda Sierras, MD  TRESIBA FLEXTOUCH 100 UNIT/ML FlexTouch Pen INJECT 10 UNITS INTO THE SKIN DAILY 03/08/22   Burchette, Alinda Sierras, MD      Allergies    Codeine and Morphine    Review of Systems   Review of Systems  Physical Exam Updated Vital Signs BP (!) 140/111   Pulse (!) 43   Temp 98.5 F (36.9 C)   Resp (!) 24   Ht _0  (1.753 m)   Wt 73.9 kg   SpO2 99%   BMI 24.07 kg/m  Physical Exam Vitals and nursing note reviewed.  Constitutional:      General: He is not in acute distress.    Appearance: He is well-developed. He is not ill-appearing.  HENT:     Head: Normocephalic and atraumatic.  Eyes:     Extraocular Movements: Extraocular movements intact.     Conjunctiva/sclera: Conjunctivae normal.     Pupils: Pupils are equal, round, and reactive to light.  Cardiovascular:     Rate and Rhythm: Normal rate and regular rhythm.     Pulses:          Radial pulses are 2+ on the right side and 2+ on the left side.     Heart sounds: Normal heart sounds. No murmur heard. Pulmonary:     Effort: Pulmonary effort is normal. No respiratory distress.     Breath sounds: Normal breath sounds. No decreased breath sounds or wheezing.  Abdominal:     Palpations: Abdomen is soft.     Tenderness: There is no abdominal tenderness.  Musculoskeletal:        General: No swelling.     Cervical back: Normal range of motion and neck supple.     Right lower leg: Edema present.     Left lower leg: Edema present.     Comments: Trace edema in his legs bilaterally  Skin:    General: Skin is warm and dry.     Capillary Refill: Capillary refill takes less than 2 seconds.  Neurological:     General: No focal deficit present.     Mental Status: He is alert.  Psychiatric:        Mood and Affect: Mood normal.     ED Results / Procedures / Treatments   Labs (all labs ordered are listed, but only abnormal results are displayed) Labs Reviewed  BASIC  METABOLIC PANEL - Abnormal; Notable for the following components:      Result Value   Sodium 134 (*)    CO2 21 (*)    Glucose, Bld 206 (*)    Creatinine, Ser 1.73 (*)    GFR, Estimated 38 (*)    All other components within normal limits  CBC - Abnormal; Notable for the  following components:   Hemoglobin 11.9 (*)    HCT 37.3 (*)    All other components within normal limits  BRAIN NATRIURETIC PEPTIDE - Abnormal; Notable for the following components:   B Natriuretic Peptide 1,375.4 (*)    All other components within normal limits  TROPONIN I (HIGH SENSITIVITY) - Abnormal; Notable for the following components:   Troponin I (High Sensitivity) 57 (*)    All other components within normal limits  TROPONIN I (HIGH SENSITIVITY) - Abnormal; Notable for the following components:   Troponin I (High Sensitivity) 56 (*)    All other components within normal limits  RESP PANEL BY RT-PCR (FLU A&B, COVID) [id number]    EKG EKG Interpretation  Date/Time:  Monday August 23 2022 11:19:17 EST Ventricular Rate:  82 PR Interval:    QRS Duration: 150 QT Interval:  404 QTC Calculation: 472 R Axis:   112 Text Interpretation: Atrial fibrillation with premature ventricular or aberrantly conducted complexes Right axis deviation Left ventricular hypertrophy with QRS widening and repolarization abnormality ( Cornell product ) Abnormal ECG When compared with ECG of 05-Feb-2022 18:49, PREVIOUS ECG IS PRESENT Similar to April 2023 tracing Confirmed by Nanda Quinton [phone]) on 08/23/2022 11:26:20 AM  Radiology DG Chest 2 View  Result Date: 08/23/2022 CLINICAL DATA:  Productive cough, chest pain, shortness of breath for several days. EXAM: CHEST - 2 VIEW COMPARISON:  Chest radiograph 02/05/2022 FINDINGS: Median sternotomy wires and mediastinal surgical clips as well as a valve prosthesis are stable. The heart is mildly enlarged, unchanged. The upper mediastinal contours are normal. Reticular opacities in the left  base may reflect atelectasis or scar, overall improved compared to the study from 02/05/2022. There is no other focal airspace disease. There is no pulmonary edema. There is no pleural effusion or pneumothorax There is no acute osseous abnormality. IMPRESSION: Probable left basilar atelectasis/scar, overall improved aeration since the study from 02/05/2022. No other focal airspace disease or pleural effusion. Electronically Signed   By: Valetta Mole M.D.   On: 08/23/2022 11:42    Procedures Procedures    Medications Ordered in ED Medications  furosemide (LASIX) injection 40 mg (40 mg Intravenous Given 08/23/22 1330)    ED Course/ Medical Decision Making/ A&P                           Medical Decision Making Amount and/or Complexity of Data Reviewed Labs: ordered. Radiology: ordered.  Risk Prescription drug management.   [name] is here with shortness of breath.  History of heart failure, atrial fibrillation on Eliquis, aortic stenosis, CAD, CKD.  Patient with exertional shortness of breath last few days.  Has not noticed a lot of leg swelling.  He has been coughing and having chest pain with that.  He has been having some sputum or phlegm.  Differential diagnosis is heart failure exacerbation versus pneumonia versus viral process versus less likely ACS.  We will check CBC, BMP, troponin, BNP, chest x-ray, COVID test.  EKG per my review interpretation shows atrial fibrillation rate controlled.  No obvious ischemic changes.  Per my review and interpretation of labs, troponin at baseline at 55/56.  BNP mildly elevated from baseline to 1300.  Otherwise lab work is unremarkable.  No significant anemia or electrolyte abnormality.  Chest x-ray with no obvious volume overload or pneumonia or pleural effusion.  Overall chest x-ray looks improved from x-ray from several months ago per radiology report.  He does  not have a lot of fluid on his legs.  He has no obvious respiratory distress.  Normal  room air oxygenation both at rest and with ambulation.  He was given a dose of IV Lasix in the ED with good response.  Shared decision was made to double his torsemide dose for the next 3 days and follow-up with his primary care doctor or cardiologist outpatient.  He understands return precautions.  Discharged in good condition.  Overall suspect some mild volume overload causing his symptoms.  He feels comfortable with outpatient plan which I think is reasonable.  This chart was dictated using voice recognition software.  Despite best efforts to proofread,  errors can occur which can change the documentation meaning.         Final Clinical Impression(s) / ED Diagnoses Final diagnoses:  Acute on chronic right-sided congestive heart failure ([place])    Rx / DC Orders ED Discharge Orders          Ordered    benzonatate (TESSALON) 100 MG capsule  Every 8 hours        08/23/22 1513              Lennice Sites, DO 08/23/22 1516

## 2022-08-23 NOTE — ED Triage Notes (Signed)
Pt reports two week hx of productive cough with yellow phlegm, central chest pain, and constant sob. Called PCP this morning who advised him to come to the ED.

## 2022-08-23 NOTE — ED Provider Triage Note (Addendum)
Emergency Medicine Provider Triage Evaluation Note  [name] , a 88 y.o. male  was evaluated in triage.  Pt complains of he complains of chest pain and shortness of breath.  Both of been present for about 2 weeks.  Progressively worsening.  He has been coughing and is pain is worse with coughing.  Cough is per day to have of phlegm. shortness of breath is worsening.  No peripheral edema history of CHF, COPD, CKD, A-fib anticoagulated on Eliquis.  Extensive cardiac history including open heart surgery.  Review of Systems  Positive: Cough Negative: fever  Physical Exam  BP 133/66   Pulse 91   Temp 98.5 F (36.9 C)   Resp 16   Ht '5\' 9"'$  (1.753 m)   Wt 73.9 kg   SpO2 97%   BMI 24.07 kg/m  Gen:   Awake, no distress   Resp:  Normal effort extensive inspiratory and expiratory rhonchi, small amount of wheezing MSK:   Moves extremities without difficulty  Other:  Well-healing midline chest scar  Medical Decision Making  Medically screening exam initiated at 11:19 AM.  Appropriate orders placed.  [name] was informed that the remainder of the evaluation will be completed by another provider, this initial triage assessment does not replace that evaluation, and the importance of remaining in the ED until their evaluation is complete.  Work-up initiated   Margarita Mail, PA-C 08/23/22 1121    Margarita Mail, PA-C 08/23/22 1122

## 2022-08-23 NOTE — Discharge Instructions (Signed)
Recommend taking torsemide 20 mg twice a day for the next 3 days.  Follow-up with your primary care doctor or cardiologist.  Please return if your symptoms worsen.  I have called you in [facility] to help with cough.  Do not see any obvious pneumonia on your chest x-ray today.  Your viral testing was negative.

## 2022-08-26 ENCOUNTER — Ambulatory Visit: Payer: Medicare PPO | Admitting: Family Medicine

## 2022-08-26 ENCOUNTER — Other Ambulatory Visit: Payer: Self-pay | Admitting: Family Medicine

## 2022-08-26 ENCOUNTER — Telehealth: Payer: Self-pay | Admitting: *Deleted

## 2022-08-26 ENCOUNTER — Encounter: Payer: Self-pay | Admitting: Family Medicine

## 2022-08-26 ENCOUNTER — Other Ambulatory Visit: Payer: Self-pay | Admitting: Cardiology

## 2022-08-26 ENCOUNTER — Encounter: Payer: Self-pay | Admitting: *Deleted

## 2022-08-26 VITALS — BP 135/71 | HR 91 | Temp 98.0°F | Ht 69.0 in | Wt 159.8 lb

## 2022-08-26 DIAGNOSIS — N183 Chronic kidney disease, stage 3 unspecified: Secondary | ICD-10-CM

## 2022-08-26 DIAGNOSIS — E785 Hyperlipidemia, unspecified: Secondary | ICD-10-CM

## 2022-08-26 DIAGNOSIS — I255 Ischemic cardiomyopathy: Secondary | ICD-10-CM | POA: Diagnosis not present

## 2022-08-26 DIAGNOSIS — J180 Bronchopneumonia, unspecified organism: Secondary | ICD-10-CM | POA: Diagnosis not present

## 2022-08-26 DIAGNOSIS — I480 Paroxysmal atrial fibrillation: Secondary | ICD-10-CM

## 2022-08-26 DIAGNOSIS — R0602 Shortness of breath: Secondary | ICD-10-CM

## 2022-08-26 MED ORDER — CEFDINIR 300 MG PO CAPS: 300.0000 mg | ORAL_CAPSULE | Freq: Two times a day (BID) | ORAL | 0 refills | Status: DC

## 2022-08-26 NOTE — Patient Outreach (Signed)
  Care Coordination [facility] Note Transition Care Management Follow-up Telephone Call Date of discharge and from where: Monday 08/23/22 [name]- Acute on chronic CHF exacerbation; EMMI Red Alert: "No scheduled follow up" How have you been since you were released from the hospital? Per spouse [name], on [place] and patient while phone on speaker mode: "Not doing any better at all.  Feels awful, still coughing even though I am taking the cough medicine they gave me and have been taking the extra fluid pill like they told me to.  I feel like I might be getting an ear infection.  I am weighing myself every day and my weights are staying right at 155 lbs, it was 154 lbs today.  We are considering calling the doctor, but we weren't sure if we should or not"  Any questions or concerns? Yes- patient not getting better after recent ED visit; strongly encouraged patient to schedule prompt/ urgent office visit with PCP to address post-ED visit clinical concerns; spouse denies need for assistance with scheduling-- states she will call to schedule as soon as we hang up  Items Reviewed: Did the pt receive and understand the discharge instructions provided? Yes  Medications obtained and verified? Yes  Other? No  Any new allergies since your discharge? No  Dietary orders reviewed? Yes Do you have support at home? Yes  spouse [name] assisiting as indicated/ needed with ADL's and iADL's- reports patient is essentially independent in all self-care  Home Care and Equipment/Supplies: Were home health services ordered? no If so, what is the name of the agency? N/A  Has the agency set up a time to come to the patient's home? not applicable Were any new equipment or medical supplies ordered?  No What is the name of the medical supply agency? N/A Were you able to get the supplies/equipment? not applicable Do you have any questions related to the use of the equipment or supplies? No N/A  Functional Questionnaire: (I =  Independent and D = Dependent) ADLs: I  spouse [name] assisiting as indicated/ needed  Bathing/Dressing- I  Meal Prep- I  spouse [name] assisiting as indicated/ needed  Eating- I  Maintaining continence- I  Transferring/Ambulation- I  Managing Meds- I  spouse [name] assisiting as indicated/ needed  Follow up appointments reviewed:  PCP Hospital f/u appt confirmed? No  Scheduled to see - on - @ [facility] f/u appt confirmed? Yes  Scheduled to see cardiology provider on Monday 09/27/22 @ 4:00 pm: encouraged patient to schedule sooner appointment given recent ED visit Are transportation arrangements needed? No  If their condition worsens, is the pt aware to call PCP or go to the Emergency Dept.? No Was the patient provided with contact information for the PCP's office or ED? Yes Was to pt encouraged to call back with questions or concerns? Yes  SDOH assessments and interventions completed:   Yes  Care Coordination Interventions Activated:  Yes   Care Coordination Interventions:  Reviewed daily weight ranges at home; reviewed action plan for CHF yellow zone; encouraged patient to schedule prompt ED follow up visit with PCP and/ or cardiology; encouraged patient to seek urgent/ emergent care if he does not begin to improve or if he worsens     Encounter Outcome:  Pt. Visit Completed    Oneta Rack, RN, BSN, CCRN Alumnus RN CM Care Coordination/ Transition of [facility] [phone]: direct office

## 2022-08-26 NOTE — Patient Instructions (Signed)
Take torsemide 20 mg once a day

## 2022-08-26 NOTE — Progress Notes (Signed)
OFFICE VISIT  08/26/2022  CC:  Chief Complaint  Patient presents with   Follow-up    ED visit, cough    Patient is a 88 y.o. male who presents accompanied by his wife for follow-up SOB/cough/ED visit. Patient was seen in the emergency department on 08/23/2022.  I have reviewed the encounter in detail today.  Patient presented to the ED with cough and dyspnea 3 days ago. He has history of ischemic cardiomyopathy. Evaluation showed BNP elevated over his baseline but otherwise labs normal, chest x-ray without pulmonary edema or pneumonia. Last echo about 3 mo ago showed EF 35 to 40%.  Patient was given 80 mg IV Lasix in the emergency department and instructed to double his torsemide dose for the next few days. He was prescribed Tessalon for cough.  UPDATE: Patient states nothing is changed.  States he has not noticed any increased urine output over baseline.   Upon further interview today he seems to have had a baseline level of shortness of breath for about 6 months or so, with recent worsening of shortness of breath the last few weeks gradually along with productive cough. He has chronic orthopnea. No recent lower extremity swelling.  He has atrial fibrillation, history of aortic valve replacement, is taking Eliquis 2.5 mg twice a day.  He has chronic renal insufficiency with a GFR around 30.  This was stable in the ED a few days ago.  He takes one 10 mEq potassium tab daily.  ROS as above, plus--> + chronic generalized weakness. No fevers, no CP, no wheezing, no HAs, no melena/hematochezia.      No recent changes in lower legs. No n/v/d or abd pain.  No palpitations.     Past Medical History:  Diagnosis Date   Aortic stenosis, moderate 09/20/2020   Arthritis    CAD (coronary artery disease)    a. Cath September 2015 LIMA to the LAD patent, SVG to PDA patent, SVG to posterior lateral patent, SVG to OM with a 90% in-stent restenosis at an anastomotic lesion. This was treated with  angioplasty. b. cath 04/01/2015 95% ISR in SVG to OM treated with 2.75x24 Synergy DES postdilated to 3.90m, all other grafts patent   Cataract    surgery,B/L   CHF (congestive heart failure) (HCC)    Chronic kidney disease    nephrolithiasis   Diabetes mellitus    TYPE 2   GERD (gastroesophageal reflux disease)    Hernia    Hypercholesterolemia    Hypertension    Incontinence    hx  over 1 year,leaks without awareness   Other and unspecified diseases of appendix    PAF (paroxysmal atrial fibrillation) ([facility]    in the setting of ischemia on 03/31/2015   Pancreatitis    [place]   prostate bx=Adenocarcinoam,gleason 3+4=7,PSA=6.75   Shortness of breath dyspnea    Ulcer    hx gastric    Past Surgical History:  Procedure Laterality Date   APPENDECTOMY     BACK SURGERY     CARDIAC CATHETERIZATION N/A 04/01/2015   Procedure: Left Heart Cath and Cors/Grafts Angiography;  Surgeon: JJettie Booze MD;  [facility];  Service: Cardiovascular;  Laterality: N/A;   CARDIAC CATHETERIZATION N/A 04/01/2015   Procedure: Coronary Stent Intervention;  Surgeon: JJettie Booze MD;  [facility];  Service: Cardiovascular;  Laterality: N/A;   CARDIOVERSION N/A 11/01/2019   Procedure: CARDIOVERSION;  Surgeon: OGeralynn Rile MD;  Location:  [place] [facility];  Service: Cardiovascular;  Laterality: N/A;   CARPAL TUNNEL RELEASE  2004   both hands   CORONARY ARTERY BYPASS GRAFT     CORONARY BALLOON ANGIOPLASTY N/A 08/19/2021   Procedure: CORONARY BALLOON ANGIOPLASTY;  Surgeon: Burnell Blanks, MD;  Location: [place] [facility];  Service: Cardiovascular;  Laterality: N/A;   CORONARY STENT INTERVENTION N/A 09/23/2020   Procedure: CORONARY STENT INTERVENTION;  Surgeon: Sherren Mocha, MD;  Location: [place] [facility];  Service: Cardiovascular;  Laterality: N/A;   CYSTOSCOPY  08/23/12   incomplete emoptying bladder   HERNIA REPAIR      incision and drainage of right chest abscess     INTRAOPERATIVE TRANSTHORACIC ECHOCARDIOGRAM Left 04/28/2021   Procedure: INTRAOPERATIVE TRANSTHORACIC ECHOCARDIOGRAM;  Surgeon: Burnell Blanks, MD;  Location: [place];  Service: Open Heart Surgery;  Laterality: Left;   LAPAROSCOPIC CHOLECYSTECTOMY  09/2009   LEFT HEART CATH AND CORS/GRAFTS ANGIOGRAPHY N/A 08/19/2021   Procedure: LEFT HEART CATH AND CORS/GRAFTS ANGIOGRAPHY;  Surgeon: Burnell Blanks, MD;  Location: [place] [facility];  Service: Cardiovascular;  Laterality: N/A;   LEFT HEART CATHETERIZATION WITH CORONARY ANGIOGRAM N/A 07/19/2014   Procedure: LEFT HEART CATHETERIZATION WITH CORONARY ANGIOGRAM;  Surgeon: Leonie Man, MD;  Location: [facility];  Service: Cardiovascular;  Laterality: N/A;   RECTAL SURGERY     RIGHT/LEFT HEART CATH AND CORONARY/GRAFT ANGIOGRAPHY N/A 09/23/2020   Procedure: RIGHT/LEFT HEART CATH AND CORONARY/GRAFT ANGIOGRAPHY;  Surgeon: Sherren Mocha, MD;  Location: [place] [facility];  Service: Cardiovascular;  Laterality: N/A;   ROBOT ASSISTED LAPAROSCOPIC RADICAL PROSTATECTOMY  2005   TRANSCATHETER AORTIC VALVE REPLACEMENT, TRANSFEMORAL Bilateral 04/28/2021   Procedure: TRANSCATHETER AORTIC VALVE REPLACEMENT, TRANSFEMORAL;  Surgeon: Burnell Blanks, MD;  Location: [place];  Service: Open Heart Surgery;  Laterality: Bilateral;   ULTRASOUND GUIDANCE FOR VASCULAR ACCESS Bilateral 04/28/2021   Procedure: ULTRASOUND GUIDANCE FOR VASCULAR ACCESS;  Surgeon: Burnell Blanks, MD;  Location: [place];  Service: Open Heart Surgery;  Laterality: Bilateral;    Outpatient Medications Prior to Visit  Medication Sig Dispense Refill   albuterol (VENTOLIN HFA) 108 (90 Base) MCG/ACT inhaler Inhale 2 puffs into the lungs every 6 (six) hours as needed for wheezing or shortness of breath.     amLODipine (NORVASC) 5 MG tablet TAKE 1 TABLET BY MOUTH EVERY DAY 90 tablet 1   BD PEN NEEDLE NANO 2ND GEN 32G X 4 MM MISC  USE AS DIRECTED. 100 each 1   benzonatate (TESSALON) 100 MG capsule Take 1 capsule (100 mg total) by mouth every 8 (eight) hours. 21 capsule 0   blood glucose meter kit and supplies KIT Dispense based on patient and insurance preference. Use up to four times daily as directed. (FOR ICD-9 250.00, 250.01). 1 each 0   budesonide-formoterol (SYMBICORT) 160-4.5 MCG/ACT inhaler Inhale 2 puffs into the lungs in the morning and at bedtime. (Patient taking differently: Inhale 2 puffs into the lungs 2 (two) times daily as needed (sob/wheezing).) 1 each 12   Carboxymethylcellul-Glycerin (LUBRICATING EYE DROPS OP) Place 1 drop into both eyes daily as needed (dry eyes).     carvedilol (COREG) 6.25 MG tablet TAKE 1 TABLET BY MOUTH 2 TIMES DAILY WITH A MEAL. 180 tablet 3   clopidogrel (PLAVIX) 75 MG tablet TAKE 1 TABLET BY MOUTH DAILY WITH BREAKFAST. 90 tablet 2   Cyanocobalamin (VITAMIN B-12 PO) Take 1 tablet by mouth daily.     docusate sodium (STOOL SOFTENER) 100 MG capsule  Take 1 capsule (100 mg total) by mouth 2 (two) times daily as needed for mild constipation. 30 capsule 0   FLUoxetine (PROZAC) 20 MG capsule Take 1 capsule (20 mg total) by mouth daily. 90 capsule 3   Homeopathic Products (THERAWORX RELIEF EX) Apply 1 application topically daily as needed (knee pain).     hydrocortisone 2.5 % cream Apply 1 application topically daily as needed (facial breakouts).     Insulin Degludec (TRESIBA) 100 UNIT/ML SOLN Inject 10 Units into the skin daily. 3 mL 0   JANUVIA 50 MG tablet TAKE 1 TABLET BY MOUTH EVERY DAY 90 tablet 1   Multiple Vitamins-Minerals (ICAPS AREDS 2 PO) Take 1 capsule by mouth daily.     nitroGLYCERIN (NITROSTAT) 0.4 MG SL tablet PLACE 1 TABLET UNDER THE TONGUE EVERY 5 MINUTES X 3 DOSES AS NEEDED FOR CHEST PAIN 25 tablet 1   pantoprazole (PROTONIX) 40 MG tablet Take 1 tablet (40 mg total) by mouth daily. 90 tablet 0   potassium chloride (KLOR-CON) 10 MEQ tablet TAKE 1 TABLET BY MOUTH EVERY DAY  90 tablet 0   rosuvastatin (CRESTOR) 20 MG tablet TAKE 1 TABLET BY MOUTH EVERY DAY 90 tablet 0   spironolactone (ALDACTONE) 25 MG tablet TAKE 1 TABLET (25 MG TOTAL) BY MOUTH DAILY. 90 tablet 3   torsemide (DEMADEX) 20 MG tablet TAKE 1 TABLET BY MOUTH EVERY DAY 90 tablet 1   TRESIBA FLEXTOUCH 100 UNIT/ML FlexTouch Pen INJECT 10 UNITS INTO THE SKIN DAILY 3 mL 2   ELIQUIS 2.5 MG TABS tablet TAKE 1 TABLET BY MOUTH TWICE A DAY 180 tablet 1   No facility-administered medications prior to visit.    Allergies  Allergen Reactions   Codeine Other (See Comments)    Makes him feel goofy   Morphine Other (See Comments)    Nightmares and felt bad    ROS As per HPI  PE:    08/26/2022    2:06 PM 08/23/2022    3:42 PM 08/23/2022    1:20 PM  Vitals with BMI  Height _0     Weight 159 lbs 13 oz    BMI 96.75    Systolic 916 384 665  Diastolic 71 73 993  Pulse 91 80 43  02 sat 97% RA today  Physical Exam General: He is alert and chronically ill-appearing.  No acute distress. Affect is a bit anxious. TTS:VXBL: no injection, icteris, swelling, or exudate.  EOMI, PERRLA. Mouth: lips without lesion/swelling.  Oral mucosa pink and moist. Oropharynx without erythema, exudate, or swelling.  CV: irreg, soft syst murmur, no diastolic murmur. No rub Lungs are clear to auscultation bilaterally.  Respiratory rate is approximately 25/min but his breathing is nonlabored. Extremities: Trace right lower extremity pitting edema, no pitting on the left.  LABS:  Last CBC Lab Results  Component Value Date   WBC 10.1 08/23/2022   HGB 11.9 (L) 08/23/2022   HCT 37.3 (L) 08/23/2022   MCV 83.8 08/23/2022   MCH 26.7 08/23/2022   RDW 15.2 08/23/2022   PLT 163 39/12/90   Last metabolic panel Lab Results  Component Value Date   GLUCOSE 206 (H) 08/23/2022   NA 134 (L) 08/23/2022   K 4.3 08/23/2022   CL 100 08/23/2022   CO2 21 (L) 08/23/2022   BUN 22 08/23/2022   CREATININE 1.73 (H) 08/23/2022    GFRNONAA 38 (L) 08/23/2022   CALCIUM 9.4 08/23/2022   PHOS 3.0 02/06/2022   PROT 7.4 02/06/2022  ALBUMIN 3.6 02/06/2022   LABGLOB 3.0 01/01/2021   AGRATIO 1.6 01/01/2021   BILITOT 1.7 (H) 02/06/2022   ALKPHOS 146 (H) 02/06/2022   AST 15 02/06/2022   ALT 17 02/06/2022   ANIONGAP 13 08/23/2022   BNP    Component Value Date/Time   BNP 1,375.4 (H) 08/23/2022 1120    ProBNP    Component Value Date/Time   PROBNP 1,846.0 (H) 02/01/2022 1003   IMPRESSION AND PLAN:  #1 chronic shortness of breath. Subacute, progressive cough and mucus production.  It is hard to tell how much his shortness of breath is above his baseline. His weight seems to have leveled out at 152 pounds the last few months.  His current weight of 160 pounds is 3 pounds less than when he was in the ED 3 days ago. Uncontrolled/decompensated congestive heart failure versus pulmonary infection.  PE low likelihood given he is on anticoagulant currently. We will treat for bronchopneumonia with cefdinir 300 mg twice daily.  For now the plan is to cut back to his usual torsemide 20 mg daily dose. We will check electrolytes and creatinine today.  An After Visit Summary was printed and given to the patient.  FOLLOW UP: Return for 4-5 d f/u SOB.  Signed:  Crissie Sickles, MD           08/26/2022

## 2022-08-26 NOTE — Telephone Encounter (Signed)
Prescription refill request for Eliquis received. Indication: Afib  Last office visit: 08/10/22 [name]) Scr: 1.73 (08/23/22)  Age: 88 Weight: 72.5kg  Appropriate dose and refill sent to requested pharmacy.

## 2022-08-27 LAB — BASIC METABOLIC PANEL
BUN: 24 mg/dL — ABNORMAL HIGH (ref 6–23)
CO2: 25 mEq/L (ref 19–32)
Calcium: 9 mg/dL (ref 8.4–10.5)
Chloride: 97 mEq/L (ref 96–112)
Creatinine, Ser: 1.71 mg/dL — ABNORMAL HIGH (ref 0.40–1.50)
GFR: 35.56 mL/min — ABNORMAL LOW (ref >60.00)
Glucose, Bld: 206 mg/dL — ABNORMAL HIGH (ref 70–99)
Potassium: 4.1 mEq/L (ref 3.5–5.1)
Sodium: 131 mEq/L — ABNORMAL LOW (ref 135–145)

## 2022-08-30 ENCOUNTER — Telehealth: Payer: Self-pay | Admitting: Cardiology

## 2022-08-30 ENCOUNTER — Telehealth: Payer: Self-pay

## 2022-08-30 NOTE — Telephone Encounter (Signed)
6 months with APP.   Please remind patient of driving restrictions of 6 months after passing out episode.   Thanks!   Lysbeth Galas T. Quentin Ore, MD, [facility], [facility]  Cardiac Electrophysiology

## 2022-08-30 NOTE — Patient Outreach (Signed)
  Care Coordination TOC Note Transition Care Management Follow-up Telephone Call Date of discharge and from where: 08/25/22- Red on EMMI-ED Discharge Alert: Lost interest things they used to enjoy. Sad,hopeless,anxious or empty How have you been since you were released from the hospital? Call completed with spouse. She shares that patient continues to not feel well. He has been having some nausea and coughing spells. Spouse voices that he remains very weak-using DME to assist with ambulation. Addressed red alert. Spouse voices that patient is HOH and may have not understood questions. She feels that patient is not at his baseline so therefore has not been able to do the things he was previously during prior to being sick. As a result, it has made him feel a little down but they are coping and hopeful he improves Any questions or concerns? Yes-as noted above. They are hopeful that they will get answers to what is going with patient at MD appt tomorrow.  Items Reviewed: Did the pt receive and understand the discharge instructions provided? Yes  Medications obtained and verified? Yes  Other? Yes  Any new allergies since your discharge? No  Dietary orders reviewed? Yes Do you have support at home? Yes   Home Care and Equipment/Supplies: Were home health services ordered? not applicable If so, what is the name of the agency? N/A  Has the agency set up a time to come to the patient's home? not applicable Were any new equipment or medical supplies ordered?  No What is the name of the medical supply agency? N/A Were you able to get the supplies/equipment? not applicable Do you have any questions related to the use of the equipment or supplies? No  Functional Questionnaire: (I = Independent and D = Dependent) ADLs: A  Bathing/Dressing- A  Meal Prep- A  Eating- I  Maintaining continence- A  Transferring/Ambulation- A  Managing Meds- A  Follow up appointments reviewed:  PCP  Hospital f/u appt confirmed? Yes  Patient has follow up appt with PCP on tomorrow. He went to see covering provider last week while PCP was out of the office and lab work done. They will get results tomorrow.  [facility] f/u appt confirmed?  N/A  . Are transportation arrangements needed? No  If their condition worsens, is the pt aware to call PCP or go to the Emergency Dept.? Yes Was the patient provided with contact information for the PCP's office or ED? Yes Was to pt encouraged to call back with questions or concerns? Yes  SDOH assessments and interventions completed:   No-assessment just completed on 08/26/22  Care Coordination Interventions Activated:  Yes   Care Coordination Interventions:  Education provided    Encounter Outcome:  Pt. Visit Completed    Enzo Montgomery, RN,BSN,CCM [employer] [facility] Telephonic Care Management Coordinator Direct Phone: [phone] Toll Free: [phone] Fax: [phone]

## 2022-08-30 NOTE — Telephone Encounter (Signed)
New Message:     Patient's wife called. She said patient have decided not have the [employer]. He wants to cancel his 09-27-22 appointment.

## 2022-08-30 NOTE — Telephone Encounter (Signed)
Reminded the patient and his wife about driving restrictions. Will send letter in the mail to remind them when it is time to schedule.   Verbalized understanding and they will call back if they change their mind, some what undecided still.

## 2022-08-31 ENCOUNTER — Inpatient Hospital Stay: Payer: Medicare PPO | Admitting: Family Medicine

## 2022-08-31 ENCOUNTER — Ambulatory Visit (INDEPENDENT_AMBULATORY_CARE_PROVIDER_SITE_OTHER): Payer: Medicare PPO | Admitting: Family Medicine

## 2022-08-31 ENCOUNTER — Ambulatory Visit: Payer: Medicare PPO | Admitting: Family Medicine

## 2022-08-31 ENCOUNTER — Encounter: Payer: Self-pay | Admitting: Family Medicine

## 2022-08-31 VITALS — BP 145/84 | HR 84 | Temp 97.3°F | Ht 69.0 in | Wt 160.4 lb

## 2022-08-31 DIAGNOSIS — I5023 Acute on chronic systolic (congestive) heart failure: Secondary | ICD-10-CM

## 2022-08-31 DIAGNOSIS — H6122 Impacted cerumen, left ear: Secondary | ICD-10-CM

## 2022-08-31 DIAGNOSIS — R0602 Shortness of breath: Secondary | ICD-10-CM | POA: Diagnosis not present

## 2022-08-31 DIAGNOSIS — J449 Chronic obstructive pulmonary disease, unspecified: Secondary | ICD-10-CM

## 2022-08-31 DIAGNOSIS — R058 Other specified cough: Secondary | ICD-10-CM | POA: Diagnosis not present

## 2022-08-31 MED ORDER — PREDNISONE 20 MG PO TABS: ORAL_TABLET | ORAL | 0 refills | Status: DC

## 2022-08-31 NOTE — Patient Instructions (Signed)
Increase torsemide to '20mg'$  TWICE a day Do albuterol nebulizer breathing treatment twice a day I am sending in prednisone to your pharmacy to start today. I have ordered a referral to ear, nose, and throat specialist to help get the wax out of your ear.

## 2022-08-31 NOTE — Progress Notes (Signed)
OFFICE VISIT  08/31/2022  CC:  Chief Complaint  Patient presents with   Follow-up    Shortness of breath    Patient is a 88 y.o. male who presents accompanied by his wife for 5d f/u acute-on-chronic cough/sob. A/P as of last visit: "chronic shortness of breath. Subacute, progressive cough and mucus production.  It is hard to tell how much his shortness of breath is above his baseline. His weight seems to have leveled out at 152 pounds the last few months.  His current weight of 160 pounds is 3 pounds less than when he was in the ED 3 days ago. Uncontrolled/decompensated congestive heart failure versus pulmonary infection.  PE low likelihood given he is on anticoagulant currently. We will treat for bronchopneumonia with cefdinir 300 mg twice daily.   For now the plan is to cut back to his usual torsemide 20 mg daily dose. We will check electrolytes and creatinine today."  INTERIM HX: BMET last visit: Na 131, fairly stable.  Other lytes normal, sCr stable CRI borderline stage 4.  He states his symptoms are unchanged. His weight is stable.  He has had no fever. The biggest symptom he still is rattly cough and shortness of breath.  Past Medical History:  Diagnosis Date   Aortic stenosis, moderate 09/20/2020   Arthritis    CAD (coronary artery disease)    a. Cath September 2015 LIMA to the LAD patent, SVG to PDA patent, SVG to posterior lateral patent, SVG to OM with a 90% in-stent restenosis at an anastomotic lesion. This was treated with angioplasty. b. cath 04/01/2015 95% ISR in SVG to OM treated with 2.75x24 Synergy DES postdilated to 3.19m, all other grafts patent   Cataract    surgery,B/L   CHF (congestive heart failure) (HCC)    Chronic kidney disease    nephrolithiasis   Diabetes mellitus    TYPE 2   GERD (gastroesophageal reflux disease)    Hernia    Hypercholesterolemia    Hypertension    Incontinence    hx  over 1 year,leaks without awareness   Other and  unspecified diseases of appendix    [place]F [place]    in the setting of ischemia on 03/31/2015   Pancreatitis    Prostate CA ([facility] 03/01/04   prostate bx=Adenocarcinoam,gleason 3+4=7,PSA=6.75   Shortness of breath dyspnea    Ulcer    hx gastric    Past Surgical History:  Procedure Laterality Date   APPENDECTOMY     BACK SURGERY     CARDIAC CATHETERIZATION N/A 04/01/2015   Procedure: Left Heart Cath and Cors/Grafts Angiography;  Surgeon: JJettie Booze MD;  [facility];  Service: Cardiovascular;  Laterality: N/A;   CARDIAC CATHETERIZATION N/A 04/01/2015   Procedure: Coronary Stent Intervention;  Surgeon: JJettie Booze MD;  [facility];  Service: Cardiovascular;  Laterality: N/A;   CARDIOVERSION N/A 11/01/2019   Procedure: CARDIOVERSION;  Surgeon: OGeralynn Rile MD;  [place] [facility]  Service: Cardiovascular;  Laterality: N/A;   CARPAL TUNNEL RELEASE  2004   both hands   CORONARY ARTERY BYPASS GRAFT     CORONARY BALLOON ANGIOPLASTY N/A 08/19/2021   Procedure: CORONARY BALLOON ANGIOPLASTY;  Surgeon: MBurnell Blanks MD;  [facility];  Service: Cardiovascular;  Laterality: N/A;   CORONARY STENT INTERVENTION N/A 09/23/2020   Procedure: CORONARY STENT INTERVENTION;  Surgeon: CSherren Mocha MD;  [facility];  Service: Cardiovascular;  Laterality: N/A;   CYSTOSCOPY  08/23/12   incomplete emoptying bladder   HERNIA REPAIR     incision and drainage of right chest abscess     INTRAOPERATIVE TRANSTHORACIC ECHOCARDIOGRAM Left 04/28/2021   Procedure: INTRAOPERATIVE TRANSTHORACIC ECHOCARDIOGRAM;  Surgeon: Burnell Blanks, MD;  Location: [place];  Service: Open Heart Surgery;  Laterality: Left;   LAPAROSCOPIC CHOLECYSTECTOMY  09/2009   LEFT HEART CATH AND CORS/GRAFTS ANGIOGRAPHY N/A 08/19/2021   Procedure: LEFT HEART CATH AND CORS/GRAFTS ANGIOGRAPHY;  Surgeon:  Burnell Blanks, MD;  Location: [place] [facility];  Service: Cardiovascular;  Laterality: N/A;   LEFT HEART CATHETERIZATION WITH CORONARY ANGIOGRAM N/A 07/19/2014   Procedure: LEFT HEART CATHETERIZATION WITH CORONARY ANGIOGRAM;  Surgeon: Leonie Man, MD;  Location: [facility] CATH LAB;  Service: Cardiovascular;  Laterality: N/A;   RECTAL SURGERY     RIGHT/LEFT HEART CATH AND CORONARY/GRAFT ANGIOGRAPHY N/A 09/23/2020   Procedure: RIGHT/LEFT HEART CATH AND CORONARY/GRAFT ANGIOGRAPHY;  Surgeon: Sherren Mocha, MD;  Location: [place] [facility] LAB;  Service: Cardiovascular;  Laterality: N/A;   ROBOT ASSISTED LAPAROSCOPIC RADICAL PROSTATECTOMY  2005   TRANSCATHETER AORTIC VALVE REPLACEMENT, TRANSFEMORAL Bilateral 04/28/2021   Procedure: TRANSCATHETER AORTIC VALVE REPLACEMENT, TRANSFEMORAL;  Surgeon: Burnell Blanks, MD;  Location: [place];  Service: Open Heart Surgery;  Laterality: Bilateral;   ULTRASOUND GUIDANCE FOR VASCULAR ACCESS Bilateral 04/28/2021   Procedure: ULTRASOUND GUIDANCE FOR VASCULAR ACCESS;  Surgeon: Burnell Blanks, MD;  Location: [place];  Service: Open Heart Surgery;  Laterality: Bilateral;    Outpatient Medications Prior to Visit  Medication Sig Dispense Refill   albuterol (VENTOLIN HFA) 108 (90 Base) MCG/ACT inhaler Inhale 2 puffs into the lungs every 6 (six) hours as needed for wheezing or shortness of breath.     amLODipine (NORVASC) 5 MG tablet TAKE 1 TABLET BY MOUTH EVERY DAY 90 tablet 1   BD PEN NEEDLE NANO 2ND GEN 32G X 4 MM MISC USE AS DIRECTED. 100 each 1   benzonatate (TESSALON) 100 MG capsule Take 1 capsule (100 mg total) by mouth every 8 (eight) hours. 21 capsule 0   blood glucose meter kit and supplies KIT Dispense based on patient and insurance preference. Use up to four times daily as directed. (FOR ICD-9 250.00, 250.01). 1 each 0   budesonide-formoterol (SYMBICORT) 160-4.5 MCG/ACT inhaler Inhale 2 puffs into the lungs in the morning and at bedtime.  (Patient taking differently: Inhale 2 puffs into the lungs 2 (two) times daily as needed (sob/wheezing).) 1 each 12   Carboxymethylcellul-Glycerin (LUBRICATING EYE DROPS OP) Place 1 drop into both eyes daily as needed (dry eyes).     carvedilol (COREG) 6.25 MG tablet TAKE 1 TABLET BY MOUTH 2 TIMES DAILY WITH A MEAL. 180 tablet 3   cefdinir (OMNICEF) 300 MG capsule Take 1 capsule (300 mg total) by mouth 2 (two) times daily. 20 capsule 0   clopidogrel (PLAVIX) 75 MG tablet TAKE 1 TABLET BY MOUTH DAILY WITH BREAKFAST. 90 tablet 2   Cyanocobalamin (VITAMIN B-12 PO) Take 1 tablet by mouth daily.     docusate sodium (STOOL SOFTENER) 100 MG capsule Take 1 capsule (100 mg total) by mouth 2 (two) times daily as needed for mild constipation. 30 capsule 0   ELIQUIS 2.5 MG TABS tablet TAKE 1 TABLET BY MOUTH TWICE A DAY 180 tablet 1   FLUoxetine (PROZAC) 20 MG capsule Take 1 capsule (20 mg total) by mouth daily. 90 capsule 3   Homeopathic Products ([place])  Apply 1 application topically daily as needed (knee pain).     hydrocortisone 2.5 % cream Apply 1 application topically daily as needed (facial breakouts).     Insulin Degludec (TRESIBA) 100 UNIT/ML SOLN Inject 10 Units into the skin daily. 3 mL 0   JANUVIA 50 MG tablet TAKE 1 TABLET BY MOUTH EVERY DAY 90 tablet 1   Multiple Vitamins-Minerals (ICAPS AREDS 2 PO) Take 1 capsule by mouth daily.     pantoprazole (PROTONIX) 40 MG tablet Take 1 tablet (40 mg total) by mouth daily. 90 tablet 0   potassium chloride (KLOR-CON) 10 MEQ tablet TAKE 1 TABLET BY MOUTH EVERY DAY 90 tablet 0   rosuvastatin (CRESTOR) 20 MG tablet TAKE 1 TABLET BY MOUTH EVERY DAY 90 tablet 0   spironolactone (ALDACTONE) 25 MG tablet TAKE 1 TABLET (25 MG TOTAL) BY MOUTH DAILY. 90 tablet 3   torsemide (DEMADEX) 20 MG tablet TAKE 1 TABLET BY MOUTH EVERY DAY 90 tablet 1   TRESIBA FLEXTOUCH 100 UNIT/ML FlexTouch Pen INJECT 10 UNITS INTO THE SKIN DAILY 3 mL 2   nitroGLYCERIN  (NITROSTAT) 0.4 MG SL tablet PLACE 1 TABLET UNDER THE TONGUE EVERY 5 MINUTES X 3 DOSES AS NEEDED FOR CHEST PAIN (Patient not taking: Reported on 08/31/2022) 25 tablet 1   No facility-administered medications prior to visit.    Allergies  Allergen Reactions   Codeine Other (See Comments)    Makes him feel goofy   Morphine Other (See Comments)    Nightmares and felt bad    ROS As per HPI  PE:    08/31/2022   10:50 AM 08/26/2022    2:06 PM 08/23/2022    3:42 PM  Vitals with BMI  Height _0  _1    Weight 160 lbs 6 oz 159 lbs 13 oz   BMI 41.93 79.02   Systolic 409 735 329  Diastolic 84 71 73  Pulse 84 91 80  02 sat 94% on RA today  Physical Exam  Gen: alert, his degree of SOB waxes and wanes.  Doesn't appear acutely ill. Left ear with 100% cerumen impaction.  Right ear is clear. CV: irreg irreg, rate 85, no murmur LUNGS: CTA bilat, mild dec aeration in both bases EXT: 1+ bilat LL pitting edema, L is a bit more than R  LABS:  Last CBC Lab Results  Component Value Date   WBC 10.1 08/23/2022   HGB 11.9 (L) 08/23/2022   HCT 37.3 (L) 08/23/2022   MCV 83.8 08/23/2022   MCH 26.7 08/23/2022   RDW 15.2 08/23/2022   PLT 163 92/42/6834   Last metabolic panel Lab Results  Component Value Date   GLUCOSE 206 (H) 08/26/2022   NA 131 (L) 08/26/2022   K 4.1 08/26/2022   CL 97 08/26/2022   CO2 25 08/26/2022   BUN 24 (H) 08/26/2022   CREATININE 1.71 (H) 08/26/2022   GFRNONAA 38 (L) 08/23/2022   CALCIUM 9.0 08/26/2022   PHOS 3.0 02/06/2022   PROT 7.4 02/06/2022   ALBUMIN 3.6 02/06/2022   LABGLOB 3.0 01/01/2021   AGRATIO 1.6 01/01/2021   BILITOT 1.7 (H) 02/06/2022   ALKPHOS 146 (H) 02/06/2022   AST 15 02/06/2022   ALT 17 02/06/2022   ANIONGAP 13 08/23/2022   IMPRESSION AND PLAN:  #1 acute-on-chronic productive cough and shortness of breath/dyspnea on exertion. Suspect multifactorial: Congestive heart failure, chronic bronchitis/copd exacerbation.  Looking back in  chart he does have documented obstructive lung disease with bronchodilator response on  PFTs.  History of Symbicort and albuterol in the past but he really cannot recall how long he tried this before abandoning it. Will do trial of prednisone 40 mg a day x5 days. Additionally we will do slight increase in his diuretic: Increase from 20 mg torsemide daily to 20 mg twice daily. Finish cefdinir.  #2 chronic renal insufficiency stage IIIb/IV. This was stable a few days ago after he had had increased diuretic in the ED shortly before. Since then he has been back on his usual torsemide dose.  I do not feel like we need to recheck these labs today.  #3 cerumen impaction, left ear. Attempted irrigation briefly today but had to abort due to patient reporting pain. Obstruction remained in place, prevents visualization of TM. Refer to ENT.  An After Visit Summary was printed and given to the patient.  FOLLOW UP: Return for keep f/u with Dr. Elease Hashimoto set for 11/17.  Signed:  Crissie Sickles, MD           08/31/2022

## 2022-09-03 ENCOUNTER — Ambulatory Visit: Payer: Medicare PPO | Admitting: Family Medicine

## 2022-09-03 VITALS — BP 124/50 | HR 62 | Temp 97.6°F | Wt 159.2 lb

## 2022-09-03 DIAGNOSIS — R059 Cough, unspecified: Secondary | ICD-10-CM

## 2022-09-03 DIAGNOSIS — E1121 Type 2 diabetes mellitus with diabetic nephropathy: Secondary | ICD-10-CM

## 2022-09-03 DIAGNOSIS — R0600 Dyspnea, unspecified: Secondary | ICD-10-CM | POA: Diagnosis not present

## 2022-09-03 MED ORDER — PREDNISONE 20 MG PO TABS: ORAL_TABLET | ORAL | 0 refills | Status: DC

## 2022-09-03 NOTE — Progress Notes (Signed)
Established Patient Office Visit  Subjective   Patient ID: [name], male    DOB: 1932-12-27  Age: 88 y.o. MRN: [id number]  Chief Complaint  Patient presents with   Follow-up    Patient is present with daughter following from emergency room for congestion, fever, cough. Pt reports he is getting a little bit better. Still feeling fatigue, a little bit SOB and cough. Negative for covid test on 11/6.  Taking mucinex, prednisone, antibiotic. Would like to discuss to provider on eliquis and plavix.     HPI   [name] is here today accompanied by daughter.  He has multiple chronic problems as previously outlined and chronic dyspnea and has had multiple recent visits both the other physicians as well as ER for dyspnea and cough.  Daughter thinks this may have been exacerbated by getting up leaves in dusty/dry conditions weeks ago.  He went to the ER on the sixth and had BNP level of 1375.  This is chronically elevated.  Chest x-ray did not show any obvious pulmonary edema and did not seem to be volume overloaded on exam.  Labs were fairly stable otherwise.  He was then seen in follow-up on the ninth at another primary care office and started on cefdinir 300 mg twice daily.  He was seen in follow-up on the 14th and prednisone was added for some reactive airway issues.  He feels like the prednisone and antibiotic have helped substantially.  Still has some chronic dyspnea but overall improved.  He has been on Symbicort in the past and has but is not using.  They had a question today about driving.  He had brief syncopal episode while seated at rest recently and is seeing cardiologist and loop recorder recommended with suspicion for possible arrhythmia.  He canceled the loop recorder but is willing to reconsider.  No history of seizure.  Denies any recent chest pains.  Past Medical History:  Diagnosis Date   Aortic stenosis, moderate 09/20/2020   Arthritis    CAD (coronary artery disease)    a.  Cath September 2015 LIMA to the LAD patent, SVG to PDA patent, SVG to posterior lateral patent, SVG to OM with a 90% in-stent restenosis at an anastomotic lesion. This was treated with angioplasty. b. cath 04/01/2015 95% ISR in SVG to OM treated with 2.75x24 Synergy DES postdilated to 3.67mm, all other grafts patent   Cataract    surgery,B/L   CHF (congestive heart failure) (HCC)    Chronic kidney disease    nephrolithiasis   Diabetes mellitus    TYPE 2   GERD (gastroesophageal reflux disease)    Hernia    Hypercholesterolemia    Hypertension    Incontinence    hx  over 1 year,leaks without awareness   Other and unspecified diseases of appendix    PAF (paroxysmal atrial fibrillation) (HCC)    in the setting of ischemia on 03/31/2015   Pancreatitis    Prostate CA (HCC) 03/01/04   prostate bx=Adenocarcinoam,gleason 3+4=7,PSA=6.75   Shortness of breath dyspnea    Ulcer    hx gastric   Past Surgical History:  Procedure Laterality Date   APPENDECTOMY     BACK SURGERY     CARDIAC CATHETERIZATION N/A 04/01/2015   Procedure: Left Heart Cath and Cors/Grafts Angiography;  Surgeon: Corky Crafts, MD;  Location: [facility] INVASIVE CV LAB;  Service: Cardiovascular;  Laterality: N/A;   CARDIAC CATHETERIZATION N/A 04/01/2015   Procedure: Coronary Stent Intervention;  Surgeon: Renelda Loma  Hoyle Barr, MD;  Location: [facility] IN[facility]AS[facility] CV LAB;  Service: Cardiovascular;  Laterality: N/A;   CARDIOVERSION N/A 11/01/2019   Procedure: CARDIOVERSION;  Surgeon: Sande Rives, MD;  Location: [facility] ENDOSCOPY;  Service: Cardiovascular;  Laterality: N/A;   CARPAL TUNNEL RELEASE  2004   both hands   CORONARY ARTERY BYPASS GRAFT     CORONARY BALLOON ANGIOPLASTY N/A 08/19/2021   Procedure: CORONARY BALLOON ANGIOPLASTY;  Surgeon: Kathleene Hazel, MD;  Location: [facility]VAS[facility] CV LAB;  Service: Cardiovascular;  Laterality: N/A;   CORONARY STENT INTERVENTION N/A 09/23/2020   Procedure: CORONARY STENT INTERVENTION;   Surgeon: Tonny Bollman, MD;  Location: [facility] INVASIVE CV LAB;  Service: Cardiovascular;  Laterality: N/A;   CYSTOSCOPY  08/23/12   incomplete emoptying bladder   HERNIA REPAIR     incision and drainage of right chest abscess     INTRAOPERATIVE TRANSTHORACIC ECHOCARDIOGRAM Left 04/28/2021   Procedure: INTRAOPERATIVE TRANSTHORACIC ECHOCARDIOGRAM;  Surgeon: Kathleene Hazel, MD;  Location: [facility] OR;  Service: Open Heart Surgery;  Laterality: Left;   LAPAROSCOPIC CHOLECYSTECTOMY  09/2009   LEFT HEART CATH AND CORS/GRAFTS ANGIOGRAPHY N/A 08/19/2021   Procedure: LEFT HEART CATH AND CORS/GRAFTS ANGIOGRAPHY;  Surgeon: Kathleene Hazel, MD;  [facility];  Service: Cardiovascular;  Laterality: N/A;   LEFT HEART CATHETERIZATION WITH CORONARY ANGIOGRAM N/A 07/19/2014   Procedure: LEFT HEART CATHETERIZATION WITH CORONARY ANGIOGRAM;  Surgeon: Marykay Lex, MD;  Location: [facility] CATH LAB;  Service: Cardiovascular;  Laterality: N/A;   RECTAL SURGERY     RIGHT/LEFT HEART CATH AND CORONARY/GRAFT ANGIOGRAPHY N/A 09/23/2020   Procedure: RIGHT/LEFT HEART CATH AND CORONARY/GRAFT ANGIOGRAPHY;  Surgeon: Tonny Bollman, MD;  Location: [facility] INVASIVE CV LAB;  Service: Cardiovascular;  Laterality: N/A;   ROBOT ASSISTED LAPAROSCOPIC RADICAL PROSTATECTOMY  2005   TRANSCATHETER AORTIC VALVE REPLACEMENT, TRANSFEMORAL Bilateral 04/28/2021   Procedure: TRANSCATHETER AORTIC VALVE REPLACEMENT, TRANSFEMORAL;  Surgeon: Kathleene Hazel, MD;  [facility];  Service: Open Heart Surgery;  Laterality: Bilateral;   ULTRASOUND GUIDANCE FOR VASCULAR ACCESS Bilateral 04/28/2021   Procedure: ULTRASOUND GUIDANCE FOR VASCULAR ACCESS;  Surgeon: Kathleene Hazel, MD;  Location: [facility] OR;  Service: Open Heart Surgery;  Laterality: Bilateral;    reports that he quit smoking about 50 years ago. His smoking use included cigarettes. He has a 2.50 pack-year smoking history. He has never used smokeless tobacco. He  reports that he does not drink alcohol and does not use drugs. family history includes Cancer in his mother; Heart attack in his father. Allergies  Allergen Reactions   Codeine Other (See Comments)    Makes him feel goofy   Morphine Other (See Comments)    Nightmares and felt bad    Review of Systems  Constitutional:  Negative for chills, fever and malaise/fatigue.  Eyes:  Negative for blurred vision.  Respiratory:  Positive for cough, sputum production and shortness of breath. Negative for hemoptysis.   Cardiovascular:  Negative for chest pain.  Neurological:  Negative for dizziness, weakness and headaches.      Objective:     BP (!) 124/50 (BP Location: Left Arm, Patient Position: Sitting, Cuff Size: Normal)   Pulse 62   Temp 97.6 F (36.4 C) (Oral)   Wt 159 lb 3.2 oz (72.2 kg)   SpO2 96%   BMI 23.51 kg/m    Physical Exam Vitals reviewed.  Constitutional:      Appearance: Normal appearance.  Cardiovascular:     Rate and Rhythm: Normal rate  and regular rhythm.  Pulmonary:     Effort: Pulmonary effort is normal.     Comments: Few faint scattered wheezes.  No rales. Musculoskeletal:     Cervical back: Neck supple.     Right lower leg: No edema.     Left lower leg: No edema.  Neurological:     Mental Status: He is alert.      No results found for any visits on 09/03/22.    The ASCVD Risk score (Arnett DK, et al., 2019) failed to calculate for the following reasons:   The 2019 ASCVD risk score is only valid for ages 27 to 76   The patient has a prior MI or stroke diagnosis    Assessment & Plan:   #1 persistent cough.  He has chronic dyspnea.  Question component of reactive airways.  He did improve some with prednisone.  Is in no respiratory distress today with pulse oximetry 96%. -We did encourage him to get back on Symbicort -Agreed to extend his prednisone for a few more days but monitor blood sugars closely  #2 history of type 2 diabetes-control.   Recent A1c 6.9% in October  #3 chronic dyspnea.  Likely multifactorial.  Does not appear to be volume overloaded at this time.  Continue torsemide 20 mg daily  Return in about 1 month (around 10/03/2022).    [name], MD

## 2022-09-03 NOTE — Patient Instructions (Signed)
Consider getting back on Symbicort for the cough/wheezing.

## 2022-09-16 ENCOUNTER — Other Ambulatory Visit: Payer: Self-pay | Admitting: Family Medicine

## 2022-09-16 DIAGNOSIS — K2211 Ulcer of esophagus with bleeding: Secondary | ICD-10-CM

## 2022-09-20 ENCOUNTER — Encounter (HOSPITAL_COMMUNITY): Payer: Self-pay | Admitting: Emergency Medicine

## 2022-09-20 ENCOUNTER — Emergency Department (HOSPITAL_COMMUNITY): Payer: Medicare PPO

## 2022-09-20 ENCOUNTER — Inpatient Hospital Stay (HOSPITAL_COMMUNITY)
Admission: EM | Admit: 2022-09-20 | Discharge: 2022-09-24 | DRG: 177 | Disposition: A | Discharge status: Home Health | Payer: Medicare PPO | Attending: Internal Medicine | Admitting: Internal Medicine

## 2022-09-20 ENCOUNTER — Other Ambulatory Visit: Payer: Self-pay

## 2022-09-20 DIAGNOSIS — K219 Gastro-esophageal reflux disease without esophagitis: Secondary | ICD-10-CM | POA: Diagnosis not present

## 2022-09-20 DIAGNOSIS — E1122 Type 2 diabetes mellitus with diabetic chronic kidney disease: Secondary | ICD-10-CM | POA: Diagnosis not present

## 2022-09-20 DIAGNOSIS — Z8546 Personal history of malignant neoplasm of prostate: Secondary | ICD-10-CM | POA: Diagnosis not present

## 2022-09-20 DIAGNOSIS — Z955 Presence of coronary angioplasty implant and graft: Secondary | ICD-10-CM | POA: Diagnosis not present

## 2022-09-20 DIAGNOSIS — I2510 Atherosclerotic heart disease of native coronary artery without angina pectoris: Secondary | ICD-10-CM | POA: Diagnosis present

## 2022-09-20 DIAGNOSIS — N189 Chronic kidney disease, unspecified: Secondary | ICD-10-CM

## 2022-09-20 DIAGNOSIS — Z885 Allergy status to narcotic agent status: Secondary | ICD-10-CM

## 2022-09-20 DIAGNOSIS — J1282 Pneumonia due to coronavirus disease 2019: Secondary | ICD-10-CM | POA: Diagnosis not present

## 2022-09-20 DIAGNOSIS — Z7984 Long term (current) use of oral hypoglycemic drugs: Secondary | ICD-10-CM

## 2022-09-20 DIAGNOSIS — I130 Hypertensive heart and chronic kidney disease with heart failure and stage 1 through stage 4 chronic kidney disease, or unspecified chronic kidney disease: Secondary | ICD-10-CM | POA: Diagnosis present

## 2022-09-20 DIAGNOSIS — I482 Chronic atrial fibrillation, unspecified: Secondary | ICD-10-CM | POA: Diagnosis present

## 2022-09-20 DIAGNOSIS — R079 Chest pain, unspecified: Secondary | ICD-10-CM | POA: Diagnosis present

## 2022-09-20 DIAGNOSIS — U071 COVID-19: Secondary | ICD-10-CM | POA: Diagnosis not present

## 2022-09-20 DIAGNOSIS — N1832 Chronic kidney disease, stage 3b: Secondary | ICD-10-CM | POA: Diagnosis present

## 2022-09-20 DIAGNOSIS — E871 Hypo-osmolality and hyponatremia: Secondary | ICD-10-CM | POA: Diagnosis not present

## 2022-09-20 DIAGNOSIS — Z87891 Personal history of nicotine dependence: Secondary | ICD-10-CM

## 2022-09-20 DIAGNOSIS — Z951 Presence of aortocoronary bypass graft: Secondary | ICD-10-CM | POA: Diagnosis not present

## 2022-09-20 DIAGNOSIS — J40 Bronchitis, not specified as acute or chronic: Secondary | ICD-10-CM | POA: Diagnosis present

## 2022-09-20 DIAGNOSIS — R059 Cough, unspecified: Secondary | ICD-10-CM | POA: Diagnosis not present

## 2022-09-20 DIAGNOSIS — E8770 Fluid overload, unspecified: Secondary | ICD-10-CM

## 2022-09-20 DIAGNOSIS — E1169 Type 2 diabetes mellitus with other specified complication: Secondary | ICD-10-CM | POA: Diagnosis not present

## 2022-09-20 DIAGNOSIS — E785 Hyperlipidemia, unspecified: Secondary | ICD-10-CM | POA: Diagnosis not present

## 2022-09-20 DIAGNOSIS — Z794 Long term (current) use of insulin: Secondary | ICD-10-CM | POA: Diagnosis not present

## 2022-09-20 DIAGNOSIS — R7989 Other specified abnormal findings of blood chemistry: Secondary | ICD-10-CM | POA: Diagnosis not present

## 2022-09-20 DIAGNOSIS — R0602 Shortness of breath: Secondary | ICD-10-CM | POA: Diagnosis not present

## 2022-09-20 DIAGNOSIS — I5022 Chronic systolic (congestive) heart failure: Secondary | ICD-10-CM | POA: Diagnosis present

## 2022-09-20 DIAGNOSIS — N179 Acute kidney failure, unspecified: Secondary | ICD-10-CM | POA: Diagnosis not present

## 2022-09-20 DIAGNOSIS — Z952 Presence of prosthetic heart valve: Secondary | ICD-10-CM | POA: Diagnosis not present

## 2022-09-20 DIAGNOSIS — E7800 Pure hypercholesterolemia, unspecified: Secondary | ICD-10-CM | POA: Diagnosis present

## 2022-09-20 DIAGNOSIS — E860 Dehydration: Secondary | ICD-10-CM | POA: Diagnosis not present

## 2022-09-20 DIAGNOSIS — D72829 Elevated white blood cell count, unspecified: Secondary | ICD-10-CM

## 2022-09-20 DIAGNOSIS — Z8249 Family history of ischemic heart disease and other diseases of the circulatory system: Secondary | ICD-10-CM | POA: Diagnosis not present

## 2022-09-20 DIAGNOSIS — Z7951 Long term (current) use of inhaled steroids: Secondary | ICD-10-CM

## 2022-09-20 DIAGNOSIS — Z79899 Other long term (current) drug therapy: Secondary | ICD-10-CM

## 2022-09-20 DIAGNOSIS — E875 Hyperkalemia: Secondary | ICD-10-CM | POA: Diagnosis not present

## 2022-09-20 DIAGNOSIS — Z809 Family history of malignant neoplasm, unspecified: Secondary | ICD-10-CM | POA: Diagnosis not present

## 2022-09-20 DIAGNOSIS — Z7901 Long term (current) use of anticoagulants: Secondary | ICD-10-CM

## 2022-09-20 DIAGNOSIS — Z7902 Long term (current) use of antithrombotics/antiplatelets: Secondary | ICD-10-CM

## 2022-09-20 LAB — CBC WITH DIFFERENTIAL/PLATELET
Abs Immature Granulocytes: 0.16 10*3/uL — ABNORMAL HIGH (ref 0.00–0.07)
Basophils Absolute: 0.1 10*3/uL (ref 0.0–0.1)
Basophils Relative: 0 %
Eosinophils Absolute: 0.5 10*3/uL (ref 0.0–0.5)
Eosinophils Relative: 3 %
HCT: 41.9 % (ref 39.0–52.0)
Hemoglobin: 13.5 g/dL (ref 13.0–17.0)
Immature Granulocytes: 1 %
Lymphocytes Relative: 4 %
Lymphs Abs: 0.6 10*3/uL — ABNORMAL LOW (ref 0.7–4.0)
MCH: 26.5 pg (ref 26.0–34.0)
MCHC: 32.2 g/dL (ref 30.0–36.0)
MCV: 82.2 fL (ref 80.0–100.0)
Monocytes Absolute: 1.1 10*3/uL — ABNORMAL HIGH (ref 0.1–1.0)
Monocytes Relative: 8 %
Neutro Abs: 11.6 10*3/uL — ABNORMAL HIGH (ref 1.7–7.7)
Neutrophils Relative %: 84 %
Platelets: 174 10*3/uL (ref 150–400)
RBC: 5.1 MIL/uL (ref 4.22–5.81)
RDW: 17.3 % — ABNORMAL HIGH (ref 11.5–15.5)
WBC: 14 10*3/uL — ABNORMAL HIGH (ref 4.0–10.5)
nRBC: 0 % (ref 0.0–0.2)

## 2022-09-20 LAB — BASIC METABOLIC PANEL
Anion gap: 11 (ref 5–15)
BUN: 47 mg/dL — ABNORMAL HIGH (ref 8–23)
CO2: 19 mmol/L — ABNORMAL LOW (ref 22–32)
Calcium: 9.5 mg/dL (ref 8.9–10.3)
Chloride: 99 mmol/L (ref 98–111)
Creatinine, Ser: 2.42 mg/dL — ABNORMAL HIGH (ref 0.61–1.24)
GFR, Estimated: 25 mL/min — ABNORMAL LOW (ref >60)
Glucose, Bld: 178 mg/dL — ABNORMAL HIGH (ref 70–99)
Potassium: 5.6 mmol/L — ABNORMAL HIGH (ref 3.5–5.1)
Sodium: 129 mmol/L — ABNORMAL LOW (ref 135–145)

## 2022-09-20 LAB — RESP PANEL BY RT-PCR (FLU A&B, COVID) ARPGX2
Influenza A by PCR: NEGATIVE
Influenza B by PCR: NEGATIVE
SARS Coronavirus 2 by RT PCR: POSITIVE — AB

## 2022-09-20 LAB — CBG MONITORING, ED: Glucose-Capillary: 182 mg/dL — ABNORMAL HIGH (ref 70–99)

## 2022-09-20 LAB — FERRITIN: Ferritin: 84 ng/mL (ref 24–336)

## 2022-09-20 LAB — PROCALCITONIN: Procalcitonin: 0.13 ng/mL

## 2022-09-20 LAB — LACTATE DEHYDROGENASE: LDH: 252 U/L — ABNORMAL HIGH (ref 98–192)

## 2022-09-20 LAB — FIBRINOGEN: Fibrinogen: 789 mg/dL — ABNORMAL HIGH (ref 210–475)

## 2022-09-20 LAB — TROPONIN I (HIGH SENSITIVITY)
Troponin I (High Sensitivity): 59 ng/L — ABNORMAL HIGH (ref <18)
Troponin I (High Sensitivity): 54 ng/L — ABNORMAL HIGH (ref <18)

## 2022-09-20 LAB — BRAIN NATRIURETIC PEPTIDE: B Natriuretic Peptide: 612.5 pg/mL — ABNORMAL HIGH (ref 0.0–100.0)

## 2022-09-20 LAB — D-DIMER, QUANTITATIVE: D-Dimer, Quant: 0.58 ug/mL-FEU — ABNORMAL HIGH (ref 0.00–0.50)

## 2022-09-20 LAB — C-REACTIVE PROTEIN: CRP: 13.5 mg/dL — ABNORMAL HIGH (ref <1.0)

## 2022-09-20 MED ORDER — ACETAMINOPHEN 650 MG RE SUPP: 650.0000 mg | Freq: Four times a day (QID) | RECTAL | Status: DC | PRN

## 2022-09-20 MED ORDER — CARBOXYMETHYLCELLUL-GLYCERIN 0.5-0.9 % OP SOLN: 1.0000 [drp] | Freq: Every day | OPHTHALMIC | Status: DC | PRN

## 2022-09-20 MED ORDER — APIXABAN 2.5 MG PO TABS
2.5000 mg | ORAL_TABLET | Freq: Two times a day (BID) | ORAL | Status: DC
  Administered 2022-09-20 – 2022-09-24 (×8): 2.5 mg via ORAL
  Filled 2022-09-20 (×10): qty 1

## 2022-09-20 MED ORDER — SODIUM CHLORIDE 0.9 % IV SOLN
1.0000 g | Freq: Once | INTRAVENOUS | Status: AC
  Administered 2022-09-20: 1 g via INTRAVENOUS
  Filled 2022-09-20: qty 10

## 2022-09-20 MED ORDER — ONDANSETRON HCL 4 MG/2ML IJ SOLN: 4.0000 mg | Freq: Four times a day (QID) | INTRAMUSCULAR | Status: DC | PRN

## 2022-09-20 MED ORDER — BENZONATATE 100 MG PO CAPS: 200.0000 mg | ORAL_CAPSULE | Freq: Three times a day (TID) | ORAL | Status: DC | PRN

## 2022-09-20 MED ORDER — INSULIN GLARGINE-YFGN 100 UNIT/ML ~~LOC~~ SOLN
7.0000 [IU] | Freq: Every day | SUBCUTANEOUS | Status: DC
  Administered 2022-09-20: 7 [IU] via SUBCUTANEOUS
  Filled 2022-09-20: qty 0.07

## 2022-09-20 MED ORDER — INSULIN ASPART 100 UNIT/ML IJ SOLN: 0.0000 [IU] | Freq: Three times a day (TID) | INTRAMUSCULAR | Status: DC

## 2022-09-20 MED ORDER — FLUOXETINE HCL 10 MG PO CAPS
10.0000 mg | ORAL_CAPSULE | Freq: Every day | ORAL | Status: DC
  Administered 2022-09-21 – 2022-09-24 (×4): 10 mg via ORAL
  Filled 2022-09-20 (×4): qty 1

## 2022-09-20 MED ORDER — GUAIFENESIN ER 600 MG PO TB12
600.0000 mg | ORAL_TABLET | Freq: Two times a day (BID) | ORAL | Status: DC
  Administered 2022-09-20 – 2022-09-24 (×9): 600 mg via ORAL
  Filled 2022-09-20 (×9): qty 1

## 2022-09-20 MED ORDER — PANTOPRAZOLE SODIUM 40 MG PO TBEC
40.0000 mg | DELAYED_RELEASE_TABLET | Freq: Every day | ORAL | Status: DC
  Administered 2022-09-21 – 2022-09-24 (×4): 40 mg via ORAL
  Filled 2022-09-20 (×4): qty 1

## 2022-09-20 MED ORDER — FUROSEMIDE 10 MG/ML IJ SOLN
40.0000 mg | Freq: Once | INTRAMUSCULAR | Status: AC
  Administered 2022-09-20: 40 mg via INTRAVENOUS
  Filled 2022-09-20: qty 4

## 2022-09-20 MED ORDER — HEPARIN SODIUM (PORCINE) 5000 UNIT/ML IJ SOLN: 5000.0000 [IU] | Freq: Three times a day (TID) | INTRAMUSCULAR | Status: DC

## 2022-09-20 MED ORDER — FAMOTIDINE 20 MG PO TABS: 20.0000 mg | ORAL_TABLET | Freq: Every day | ORAL | Status: DC

## 2022-09-20 MED ORDER — SODIUM CHLORIDE 0.9 % IV SOLN: 2.0000 g | INTRAVENOUS | Status: DC

## 2022-09-20 MED ORDER — AMLODIPINE BESYLATE 5 MG PO TABS: 5.0000 mg | ORAL_TABLET | Freq: Every day | ORAL | Status: DC

## 2022-09-20 MED ORDER — ONDANSETRON HCL 4 MG PO TABS
4.0000 mg | ORAL_TABLET | Freq: Four times a day (QID) | ORAL | Status: DC | PRN
  Administered 2022-09-24: 4 mg via ORAL
  Filled 2022-09-20: qty 1

## 2022-09-20 MED ORDER — MOLNUPIRAVIR EUA 200MG CAPSULE
4.0000 | ORAL_CAPSULE | Freq: Two times a day (BID) | ORAL | Status: DC
  Administered 2022-09-20 – 2022-09-24 (×8): 800 mg via ORAL
  Filled 2022-09-20 (×2): qty 4

## 2022-09-20 MED ORDER — SODIUM CHLORIDE 0.9 % IV SOLN
500.0000 mg | Freq: Once | INTRAVENOUS | Status: AC
  Administered 2022-09-20: 500 mg via INTRAVENOUS
  Filled 2022-09-20: qty 5

## 2022-09-20 MED ORDER — ALBUTEROL SULFATE HFA 108 (90 BASE) MCG/ACT IN AERS: 2.0000 | INHALATION_SPRAY | Freq: Four times a day (QID) | RESPIRATORY_TRACT | Status: DC

## 2022-09-20 MED ORDER — DOCUSATE SODIUM 100 MG PO CAPS: 100.0000 mg | ORAL_CAPSULE | Freq: Two times a day (BID) | ORAL | Status: DC | PRN

## 2022-09-20 MED ORDER — ALBUTEROL SULFATE (2.5 MG/3ML) 0.083% IN NEBU
2.5000 mg | INHALATION_SOLUTION | Freq: Four times a day (QID) | RESPIRATORY_TRACT | Status: DC
  Administered 2022-09-20 – 2022-09-22 (×6): 2.5 mg via RESPIRATORY_TRACT
  Filled 2022-09-20 (×5): qty 3

## 2022-09-20 MED ORDER — SODIUM CHLORIDE 0.9 % IV SOLN: 500.0000 mg | INTRAVENOUS | Status: DC

## 2022-09-20 MED ORDER — ACETAMINOPHEN 325 MG PO TABS
650.0000 mg | ORAL_TABLET | Freq: Four times a day (QID) | ORAL | Status: DC | PRN
  Administered 2022-09-20 – 2022-09-23 (×5): 650 mg via ORAL
  Filled 2022-09-20 (×5): qty 2

## 2022-09-20 MED ORDER — SODIUM CHLORIDE 0.9 % IV SOLN: INTRAVENOUS | Status: DC

## 2022-09-20 MED ORDER — ROSUVASTATIN CALCIUM 20 MG PO TABS
20.0000 mg | ORAL_TABLET | Freq: Every day | ORAL | Status: DC
  Administered 2022-09-21 – 2022-09-24 (×4): 20 mg via ORAL
  Filled 2022-09-20 (×4): qty 1

## 2022-09-20 MED ORDER — CARVEDILOL 6.25 MG PO TABS
6.2500 mg | ORAL_TABLET | Freq: Two times a day (BID) | ORAL | Status: DC
  Administered 2022-09-21 – 2022-09-24 (×7): 6.25 mg via ORAL
  Filled 2022-09-20: qty 1
  Filled 2022-09-20: qty 2
  Filled 2022-09-20 (×5): qty 1

## 2022-09-20 MED ORDER — MOMETASONE FURO-FORMOTEROL FUM 200-5 MCG/ACT IN AERO
2.0000 | INHALATION_SPRAY | Freq: Two times a day (BID) | RESPIRATORY_TRACT | Status: DC
  Administered 2022-09-21 – 2022-09-24 (×6): 2 via RESPIRATORY_TRACT
  Filled 2022-09-20: qty 8.8

## 2022-09-20 MED ORDER — SODIUM CHLORIDE 0.9% FLUSH
3.0000 mL | Freq: Two times a day (BID) | INTRAVENOUS | Status: DC
  Administered 2022-09-20 – 2022-09-24 (×9): 3 mL via INTRAVENOUS

## 2022-09-20 NOTE — H&P (Addendum)
History and Physical    Patient: [name] TDD:[id number] DOB: August 19, 1933 DOA: 09/20/2022 DOS: the patient was seen and examined on 09/20/2022 PCP: [name], MD  Patient coming from: Home  Chief Complaint:  Chief Complaint  Patient presents with   Chest Pain   Shortness of Breath   HPI: [name] is a 88 y.o. male with medical history significant of HTN, HLD, PAF on Eliquis, CHF, CAD s/pCABG, DM type II, prostate cancer who presents with worsening cough and shortness of breath over the last 2-3 days.  Patient reports that the cough is productive and he has been significantly short of breath with any kind of exertion.  Reports substernal chest discomfort with coughing.  Associated symptoms includes congestion, sore throat, and generalized weakness.  He has been taking all of his home meds.  He had been seen seen by his primary care provider last month for the cough and have been treated with a course of prednisone as well as given a course of Omnicef.  His wife notes he was tested approximately 4 times and had previously been negative for COVID.  Emergency department patient was noted to be afebrile with tachypnea and all other vital signs maintained.  Labs significant for WBC 14, sodium 129, potassium 5.6, BUN 47, creatinine 2.42,   glucose 178, BNP 612.5, high-sensitivity troponin 59.  2 view chest x-ray noted focal basilar consolidative opacity seen in the lateral view concerning for atelectasis but suspicious for infection.  He had given the patient Rocephin, azithromycin, and 40 mg of Lasix IV.  Review of Systems: As mentioned in the history of present illness. All other systems reviewed and are negative. Past Medical History:  Diagnosis Date   Aortic stenosis, moderate 09/20/2020   Arthritis    CAD (coronary artery disease)    a. Cath September 2015 LIMA to the LAD patent, SVG to PDA patent, SVG to posterior lateral patent, SVG to OM with a 90% in-stent restenosis at an  anastomotic lesion. This was treated with angioplasty. b. cath 04/01/2015 95% ISR in SVG to OM treated with 2.75x24 Synergy DES postdilated to 3.40m, all other grafts patent   Cataract    surgery,B/L   CHF (congestive heart failure) (HCC)    Chronic kidney disease    nephrolithiasis   Diabetes mellitus    TYPE 2   GERD (gastroesophageal reflux disease)    Hernia    Hypercholesterolemia    Hypertension    Incontinence    hx  over 1 year,leaks without awareness   Other and unspecified diseases of appendix    PAF ([facility]oxysmal [facility] fibrillation) ([facility]    in the setting of ischemia on 03/31/2015   Pancreatitis    [place] [place]   prostate bx=Adenocarcinoam,gleason 3+4=7,PSA=6.75   Shortness of breath dyspnea    Ulcer    hx gastric   Past Surgical History:  Procedure Laterality Date   APPENDECTOMY     BACK SURGERY     CARDIAC CATHETERIZATION N/A 04/01/2015   Procedure: Left Heart Cath and Cors/Grafts Angiography;  Surgeon: JJettie Booze MD;  [facility];  Service: Cardiovascular;  Laterality: N/A;   CARDIAC CATHETERIZATION N/A 04/01/2015   Procedure: Coronary Stent Intervention;  Surgeon: JJettie Booze MD;  [facility];  Service: Cardiovascular;  Laterality: N/A;   CARDIOVERSION N/A 11/01/2019   Procedure: CARDIOVERSION;  Surgeon: OGeralynn Rile MD;  [facility]  Service: Cardiovascular;  Laterality: N/A;  CARPAL TUNNEL RELEASE  2004   both hands   CORONARY ARTERY BYPASS GRAFT     CORONARY BALLOON ANGIOPLASTY N/A 08/19/2021   Procedure: CORONARY BALLOON ANGIOPLASTY;  Surgeon: Burnell Blanks, MD;  Location: [place] [facility];  Service: Cardiovascular;  Laterality: N/A;   CORONARY STENT INTERVENTION N/A 09/23/2020   Procedure: CORONARY STENT INTERVENTION;  Surgeon: Sherren Mocha, MD;  Location: [place] [facility];  Service: Cardiovascular;  Laterality: N/A;   CYSTOSCOPY  08/23/12   incomplete  emoptying bladder   HERNIA REPAIR     incision and drainage of right chest abscess     INTRAOPERATIVE TRANSTHORACIC ECHOCARDIOGRAM Left 04/28/2021   Procedure: INTRAOPERATIVE TRANSTHORACIC ECHOCARDIOGRAM;  Surgeon: Burnell Blanks, MD;  Location: [place];  Service: Open Heart Surgery;  Laterality: Left;   LAPAROSCOPIC CHOLECYSTECTOMY  09/2009   LEFT HEART CATH AND CORS/GRAFTS ANGIOGRAPHY N/A 08/19/2021   Procedure: LEFT HEART CATH AND CORS/GRAFTS ANGIOGRAPHY;  Surgeon: Burnell Blanks, MD;  Location: [place] [facility];  Service: Cardiovascular;  Laterality: N/A;   LEFT HEART CATHETERIZATION WITH CORONARY ANGIOGRAM N/A 07/19/2014   Procedure: LEFT HEART CATHETERIZATION WITH CORONARY ANGIOGRAM;  Surgeon: Leonie Man, MD;  Location: [facility];  Service: Cardiovascular;  Laterality: N/A;   RECTAL SURGERY     RIGHT/LEFT HEART CATH AND CORONARY/GRAFT ANGIOGRAPHY N/A 09/23/2020   Procedure: RIGHT/LEFT HEART CATH AND CORONARY/GRAFT ANGIOGRAPHY;  Surgeon: Sherren Mocha, MD;  Location: [place] [facility];  Service: Cardiovascular;  Laterality: N/A;   ROBOT ASSISTED LAPAROSCOPIC RADICAL PROSTATECTOMY  2005   TRANSCATHETER AORTIC VALVE REPLACEMENT, TRANSFEMORAL Bilateral 04/28/2021   Procedure: TRANSCATHETER AORTIC VALVE REPLACEMENT, TRANSFEMORAL;  Surgeon: Burnell Blanks, MD;  Location: [place];  Service: Open Heart Surgery;  Laterality: Bilateral;   ULTRASOUND GUIDANCE FOR VASCULAR ACCESS Bilateral 04/28/2021   Procedure: ULTRASOUND GUIDANCE FOR VASCULAR ACCESS;  Surgeon: Burnell Blanks, MD;  Location: [place];  Service: Open Heart Surgery;  Laterality: Bilateral;   Social History:  reports that he quit smoking about 50 years ago. His smoking use included cigarettes. He has a 2.50 pack-year smoking history. He has never used smokeless tobacco. He reports that he does not drink alcohol and does not use drugs.  Allergies  Allergen Reactions   Codeine Other (See Comments)     Makes him feel goofy   Morphine Other (See Comments)    Nightmares and felt bad    Family History  Problem Relation Age of Onset   Cancer Mother        stomach   Heart attack Father     Prior to Admission medications   Medication Sig Start Date End Date Taking? Authorizing Provider  albuterol (VENTOLIN HFA) 108 (90 Base) MCG/ACT inhaler Inhale 2 puffs into the lungs every 6 (six) hours as needed for wheezing or shortness of breath.    [provider]  amLODipine (NORVASC) 5 MG tablet TAKE 1 TABLET BY MOUTH EVERY DAY 08/09/22   Burchette, Alinda Sierras, MD  BD PEN NEEDLE NANO 2ND GEN 32G X 4 MM MISC USE AS DIRECTED. 06/02/22   Burchette, Alinda Sierras, MD  benzonatate (TESSALON) 100 MG capsule Take 1 capsule (100 mg total) by mouth every 8 (eight) hours. 08/23/22   Curatolo, Adam, DO  blood glucose meter kit and supplies KIT Dispense based on patient and insurance preference. Use up to four times daily as directed. (FOR ICD-9 250.00, 250.01). 09/08/20   Guilford Shi, MD  budesonide-formoterol (SYMBICORT) 160-4.5 MCG/ACT inhaler Inhale  2 puffs into the lungs in the morning and at bedtime. Patient taking differently: Inhale 2 puffs into the lungs 2 (two) times daily as needed (sob/wheezing). 08/07/20   Spero Geralds, MD  Carboxymethylcellul-Glycerin (LUBRICATING EYE DROPS OP) Place 1 drop into both eyes daily as needed (dry eyes).    [provider]  carvedilol (COREG) 6.25 MG tablet TAKE 1 TABLET BY MOUTH 2 TIMES DAILY WITH A MEAL. 08/10/22   [name], MD  cefdinir (OMNICEF) 300 MG capsule Take 1 capsule (300 mg total) by mouth 2 (two) times daily. 08/26/22   McGowen, Adrian Blackwater, MD  clopidogrel (PLAVIX) 75 MG tablet TAKE 1 TABLET BY MOUTH DAILY WITH BREAKFAST. 04/23/22   [name], MD  Cyanocobalamin (VITAMIN B-12 PO) Take 1 tablet by mouth daily.    [provider]  docusate sodium (STOOL SOFTENER) 100 MG capsule Take 1 capsule (100 mg total) by mouth 2 (two)  times daily as needed for mild constipation. 07/28/19   Ivor Costa, MD  ELIQUIS 2.5 MG TABS tablet TAKE 1 TABLET BY MOUTH TWICE A DAY 08/26/22   Vickie Epley, MD  FLUoxetine (PROZAC) 20 MG capsule Take 1 capsule (20 mg total) by mouth daily. 07/28/22   Burchette, Alinda Sierras, MD  Homeopathic Products [facility] RELIEF EX) Apply 1 application topically daily as needed (knee pain).    [provider]  hydrocortisone 2.5 % cream Apply 1 application topically daily as needed (facial breakouts).    [provider]  Insulin Degludec (TRESIBA) 100 UNIT/ML SOLN Inject 10 Units into the skin daily. 09/30/21   Burchette, Alinda Sierras, MD  JANUVIA 50 MG tablet TAKE 1 TABLET BY MOUTH EVERY DAY 04/19/22   Burchette, Alinda Sierras, MD  Multiple Vitamins-Minerals (ICAPS AREDS 2 PO) Take 1 capsule by mouth daily.    [provider]  nitroGLYCERIN (NITROSTAT) 0.4 MG SL tablet PLACE 1 TABLET UNDER THE TONGUE EVERY 5 MINUTES X 3 DOSES AS NEEDED FOR CHEST PAIN 01/29/22   [name], MD  pantoprazole (PROTONIX) 40 MG tablet TAKE 1 TABLET BY MOUTH EVERY DAY 09/17/22   Burchette, Alinda Sierras, MD  potassium chloride (KLOR-CON) 10 MEQ tablet TAKE 1 TABLET BY MOUTH EVERY DAY 07/30/22   Burchette, Alinda Sierras, MD  predniSONE (DELTASONE) 20 MG tablet 2 tabs po qd x 5d 09/03/22   Burchette, Alinda Sierras, MD  rosuvastatin (CRESTOR) 20 MG tablet TAKE 1 TABLET BY MOUTH EVERY DAY 08/26/22   Burchette, Alinda Sierras, MD  spironolactone (ALDACTONE) 25 MG tablet TAKE 1 TABLET (25 MG TOTAL) BY MOUTH DAILY. 12/31/21   [name], MD  torsemide (DEMADEX) 20 MG tablet TAKE 1 TABLET BY MOUTH EVERY DAY 07/20/22   Burchette, Alinda Sierras, MD  TRESIBA FLEXTOUCH 100 UNIT/ML FlexTouch Pen INJECT 10 UNITS INTO THE SKIN DAILY 03/08/22   [name], MD    Physical Exam: Vitals:   09/20/22 1245 09/20/22 1315 09/20/22 1330 09/20/22 1345  BP: 132/81 121/73 (!) 140/63 129/75  Pulse: 73 71 64 77  Resp: (!) 21 16 (!) 27 17  Temp:       TempSrc:      SpO2: 98% 98% 97% 97%  Weight:      Height:       Exam  Constitutional: Elderly male who appears ill but in no acute distress Eyes: PERRL, normal lids and conjunctiva ENMT: Mucous membranes are moist.   Hard of hearing. Neck: normal, supple.  No JVD appreciated. Respiratory: Normal respiratory effort without  significant wheezes or rhonchi appreciated.  O2 saturation currently maintained on room air Cardiovascular: Irregular irregular, no murmurs / rubs / gallops. No extremity edema.   Abdomen: no tenderness, no masses palpated. No hepatosplenomegaly. Bowel sounds positive.  Musculoskeletal: no clubbing / cyanosis. No joint deformity upper and lower extremities. Good ROM, no contractures. Normal muscle tone.  Skin: no rashes, lesions, ulcers. No induration Neurologic: CN 2-12 grossly intact. Sensation intact, DTR normal. Strength 5/5 in all 4.  Psychiatric: Normal judgment and insight. Alert and oriented x 3. Normal mood.   Data Reviewed:  EKG revealed atrial fibrillation 84 bpm.  Reviewed labs, imaging, and pertinent records as noted above in HPI.  Assessment and Plan: COVID-19 pneumonia Acute.  Patient presents with complaints of progressive worsening cough over the last 2 to 3 days, but has been feeling short of breath for quite some time.  Chest x-ray concerning for the possibility of pneumonia.  COVID-19 screening was positive.  Patient has been started on empiric antibiotics of Rocephin and azithromycin. -Admit to a telemetry bed -COVID-19 order set utilized -Maintain O2 saturation greater than 92% -Incentive spirometry and flutter valve -Check inflammatory markers -Albuterol inhaler -Pharmacy substitution of Dulera -Molnupiravir -Continue Rocephin and azithromycin and de-escalate if medically appropriate -Vitamin C and zinc -Antitussives as needed  Leukocytosis Acute.  WBC elevated at 14.  Suspect secondary to above. -Recheck CBC tomorrow morning  Chest  pain elevated troponin Chronic.  Patient reports having substernal chest pain.  High-sensitivity troponins 59-> 54.  EKG similar to prior without significant ischemic changes.  Suspect secondary to demand in the setting of acute respiratory infection.  Hyperkalemia Acute.  Potassium elevated at 5.6. -Hold spironolactone and potassium supplementation -Normal saline IV fluids at 50 mL/h -Recheck potassium in a.m.  Acute kidney injury superimposed on chronic kidney disease stage IIIb Creatinine elevated at 2.42 with BUN 42.  Baseline creatinine previously noted to be around 1.71 last month.  Suspect prerenal cause patient symptoms. -Check urinalysis -Check urine urea and urine creatinine -Hold diuretics at this time -Normal saline IV fluids at 50 mL/h x 1 L -Recheck kidney function in a.m.  Systolic congestive heart failure Chronic.  Patient appears to be euvolemic at this time.  Last echocardiogram noted EF to be 35 -40% with global hypokinesis of the left ventricle.  BNP 612.5 which is lower than prior. -Held diuretics  Chronic atrial fibrillation on chronic anticoagulation Patient appears to be in atrial fibrillation at this time, but appears rate controlled. -Continue Eliquis  Controlled diabetes mellitus type 2, with hyperlipidemia Last hemoglobin A1c 6.9 on 07/28/2022.  Home medication regimen includes Januvia 50 mg daily and Tresiba 10 units q. Evening -Hypoglycemia protocols -Held Januvia -Continue Crestor -Pharmacy substitution of Semglee 7 units nightly -CBGs before every meal with sensitive SSI  Hyponatremia Acute.  Sodium 129 on admission.  Suspect secondary to patient being possibly over diuresed. -Recheck sodium levels in a.m.  Essential hypertension -Held diuretics  GERD -Continue Protonix  DVT prophylaxis: Eliquis Advance Care Planning:   Code Status: Full Code   Consults:  Family Communication: Wife updated at bedside  Severity of Illness: The  appropriate patient status for this patient is INPATIENT. Inpatient status is judged to be reasonable and necessary in order to provide the required intensity of service to ensure the patient's safety. The patient's presenting symptoms, physical exam findings, and initial radiographic and laboratory data in the context of their chronic comorbidities is felt to place them at high risk for further  clinical deterioration. Furthermore, it is not anticipated that the patient will be medically stable for discharge from the hospital within 2 midnights of admission.   * I certify that at the point of admission it is my clinical judgment that the patient will require inpatient hospital care spanning beyond 2 midnights from the point of admission due to high intensity of service, high risk for further deterioration and high frequency of surveillance required.*  Author: Norval Morton, MD 09/20/2022 2:04 PM  For on call review [email or web address].

## 2022-09-20 NOTE — ED Provider Notes (Signed)
[facility] EMERGENCY DEPARTMENT Provider Note  CSN: [id number] Arrival date & time: 09/20/22 4098  Chief Complaint(s) Chest Pain and Shortness of Breath  HPI [name] is a 88 y.o. male with history of CAD, CHF, diabetes, hypertension, presenting to the emergency department with dyspnea.  Patient reports shortness of breath for months, worse in the past 2 to 3 days.  He reports that it is severe with any exertion.  He also reports some vague chest pain.  He denies any nausea, vomiting.  He reports generalized weakness and fatigue.  He also reports over the past few days he has had a runny nose, sore throat, productive cough.  He reports compliance with his home medications.   Past Medical History Past Medical History:  Diagnosis Date   Aortic stenosis, moderate 09/20/2020   Arthritis    CAD (coronary artery disease)    a. Cath September 2015 LIMA to the LAD patent, SVG to PDA patent, SVG to posterior lateral patent, SVG to OM with a 90% in-stent restenosis at an anastomotic lesion. This was treated with angioplasty. b. cath 04/01/2015 95% ISR in SVG to OM treated with 2.75x24 Synergy DES postdilated to 3.35m, all other grafts patent   Cataract    surgery,B/L   CHF (congestive heart failure) (HCC)    Chronic kidney disease    nephrolithiasis   Diabetes mellitus    TYPE 2   GERD (gastroesophageal reflux disease)    Hernia    Hypercholesterolemia    Hypertension    Incontinence    hx  over 1 year,leaks without awareness   Other and unspecified diseases of appendix    PAF (paroxysmal atrial fibrillation) ([place]    in the setting of ischemia on 03/31/2015   Pancreatitis    Prostate CA ([facility] 03/01/04   prostate bx=Adenocarcinoam,gleason 3+4=7,PSA=6.75   Shortness of breath dyspnea    Ulcer    hx gastric   Patient Active Problem List   Diagnosis Date Noted   COVID-19 virus infection 09/20/2022   Stage 3b chronic kidney disease (CKD) (HD[place] 02/06/2022   Type 2  diabetes mellitus with hyperlipidemia (HCC)    Anemia    Atrial fibrillation, chronic (HCC)    Acute on chronic systolic CHF (congestive heart failure) ([place] 02/05/2022   Depression, recurrent (HFairplains 09/30/2021   NSTEMI (non-ST elevated myocardial infarction) ([place]    S/P TAVR (transcatheter aortic valve replacement) 08/18/2021   Unstable angina ([place] 08/18/2021   Severe aortic stenosis 04/29/2021   Aortic stenosis, severe 04/27/2021   Angina at rest 09/20/2020   Chronic diastolic (congestive) heart failure (HB[place] 09/20/2020   COPD with acute bronchitis (HB[place] 09/08/2020   Acute exacerbation of CHF (congestive heart failure) (HK[place] 09/07/2020   COPD mixed type (HT[place] 011/91/4782  Chronic systolic HF (heart failure) (H[place] 095/62/1308  Systolic dysfunction with acute on chronic heart failure (HD[place] 10/23/2019   Educated about COVID-19 virus infection 10/23/2019   Persistent atrial fibrillation (HR[place] 08/29/2019   Nonrheumatic aortic valve stenosis 08/29/2019   Elevated troponin 07/27/2019   HLD (hyperlipidemia) 07/27/2019   GERD (gastroesophageal reflux disease) 07/27/2019   Congestive heart failure (CHF) (HL[place] 07/26/2019   Pure hypercholesterolemia    Acute on chronic diastolic CHF (congestive heart failure) (HCC)    SOB (shortness of breath) 06/04/2019   CKD (chronic kidney disease), stage III (HM[place] 06/04/2019   Coronary artery disease of native artery of native heart with stable angina pectoris (HFallon 03/24/2018   Bilateral carotid artery stenosis  07/23/2017   Palpitation 07/23/2017   Urinary urgency 07/28/2016   PAF (paroxysmal atrial fibrillation) (HCC)    DOE (dyspnea on exertion) 07/17/2014   Acute on chronic renal insufficiency- ACE and diuretics held 45/12/8880   Diastolic dysfunction- moderate on echo 2013 07/17/2014   Aortic stenosis 07/17/2014   LBBB (left bundle branch block) 07/17/2014   Chest pain 07/17/2014   Crescendo angina ([place]) 07/16/2014   CKD (chronic kidney  disease) stage 4, GFR 15-29 ml/min ([place]) 12/03/2013   Squamous cell cancer of skin of hand 05/31/2013   HEAD TRAUMA, CLOSED 09/24/2010   Ischemic cardiomyopathy- last EF Normal Myoview Jan 2015 04/02/2010   INSOMNIA 04/02/2010   Moderate bilateral ICA disease by doppler June 2015 01/08/2010   DYSPHAGIA UNSPECIFIED 09/05/2009   Hx GERD/PUD 09/03/2009   CHOLELITHIASIS, WITH CHOLECYSTITIS 09/03/2009   ARTHRITIS 07/16/2009   HYPOKALEMIA 11/06/2008   CAD in native artery 11/06/2008   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 ([place]) 11/23/2007   Dyslipidemia 11/23/2007   ROSACEA 11/23/2007   DYSPNEA ON EXERTION 11/23/2007   UNS ADVRS EFF OTH RX MEDICINAL&BIOLOGICAL SBSTNC 11/23/2007   Hypertension 11/22/2007   Hx of Prostate ca 03/01/2004   Home Medication(s) Prior to Admission medications   Medication Sig Start Date End Date Taking? Authorizing Provider  acetaminophen (TYLENOL) 500 MG tablet Take 1,000 mg by mouth every 8 (eight) hours as needed for mild pain or moderate pain.   Yes [provider]  albuterol (VENTOLIN HFA) 108 (90 Base) MCG/ACT inhaler Inhale 2 puffs into the lungs every 6 (six) hours as needed for wheezing or shortness of breath.   Yes [provider]  amLODipine (NORVASC) 5 MG tablet TAKE 1 TABLET BY MOUTH EVERY DAY Patient taking differently: Take 5 mg by mouth daily. 08/09/22  Yes Burchette, Alinda Sierras, MD  budesonide-formoterol (SYMBICORT) 160-4.5 MCG/ACT inhaler Inhale 2 puffs into the lungs in the morning and at bedtime. Patient taking differently: Inhale 2 puffs into the lungs 2 (two) times daily as needed (sob/wheezing). 08/07/20  Yes Spero Geralds, MD  Carboxymethylcellul-Glycerin (LUBRICATING EYE DROPS OP) Place 1 drop into both eyes daily as needed (dry eyes).   Yes [provider]  carvedilol (COREG) 6.25 MG tablet TAKE 1 TABLET BY MOUTH 2 TIMES DAILY WITH A MEAL. 08/10/22  Yes [name], MD  clopidogrel  (PLAVIX) 75 MG tablet TAKE 1 TABLET BY MOUTH DAILY WITH BREAKFAST. Patient taking differently: Take 75 mg by mouth daily. 04/23/22  Yes [name], MD  Cyanocobalamin (VITAMIN B-12 PO) Take 1 tablet by mouth daily.   Yes [provider]  docusate sodium (STOOL SOFTENER) 100 MG capsule Take 1 capsule (100 mg total) by mouth 2 (two) times daily as needed for mild constipation. 07/28/19  Yes Ivor Costa, MD  ELIQUIS 2.5 MG TABS tablet TAKE 1 TABLET BY MOUTH TWICE A DAY Patient taking differently: Take 2.5 mg by mouth 2 (two) times daily. 08/26/22  Yes Vickie Epley, MD  FLUoxetine (PROZAC) 20 MG capsule Take 1 capsule (20 mg total) by mouth daily. Patient taking differently: Take 10 mg by mouth daily. 07/28/22  Yes Burchette, Alinda Sierras, MD  Homeopathic Products ([place] EX) Apply 1 application topically daily as needed (knee pain).   Yes [provider]  hydrocortisone 2.5 % cream Apply 1 application topically daily as needed (facial breakouts).   Yes [provider]  Insulin Degludec (TRESIBA) 100 UNIT/ML SOLN Inject 10 Units into the skin daily. 09/30/21  Yes Burchette, Alinda Sierras, MD  JANUVIA 50 MG tablet TAKE 1 TABLET BY MOUTH EVERY DAY 04/19/22  Yes Burchette, Alinda Sierras, MD  Multiple Vitamins-Minerals (ICAPS AREDS 2 PO) Take 1 capsule by mouth daily.   Yes [provider]  nitroGLYCERIN (NITROSTAT) 0.4 MG SL tablet PLACE 1 TABLET UNDER THE TONGUE EVERY 5 MINUTES X 3 DOSES AS NEEDED FOR CHEST PAIN Patient taking differently: Place 0.4 mg under the tongue every 5 (five) minutes as needed for chest pain. 01/29/22  Yes Hochrein, Jeneen Rinks, MD  pantoprazole (PROTONIX) 40 MG tablet TAKE 1 TABLET BY MOUTH EVERY DAY 09/17/22  Yes Burchette, Alinda Sierras, MD  potassium chloride (KLOR-CON) 10 MEQ tablet TAKE 1 TABLET BY MOUTH EVERY DAY 07/30/22  Yes Burchette, Alinda Sierras, MD  rosuvastatin (CRESTOR) 20 MG tablet TAKE 1 TABLET BY MOUTH EVERY DAY 08/26/22  Yes Burchette, Alinda Sierras, MD   spironolactone (ALDACTONE) 25 MG tablet TAKE 1 TABLET (25 MG TOTAL) BY MOUTH DAILY. Patient taking differently: Take 25 mg by mouth daily. 12/31/21  Yes Hochrein, Jeneen Rinks, MD  torsemide (DEMADEX) 20 MG tablet TAKE 1 TABLET BY MOUTH EVERY DAY 07/20/22  Yes Burchette, Alinda Sierras, MD  TRESIBA FLEXTOUCH 100 UNIT/ML FlexTouch Pen INJECT 10 UNITS INTO THE SKIN DAILY Patient taking differently: Inject 10 Units into the skin every evening. 03/08/22  Yes Burchette, Alinda Sierras, MD  BD PEN NEEDLE NANO 2ND GEN 32G X 4 MM MISC USE AS DIRECTED. 06/02/22   Burchette, Alinda Sierras, MD  benzonatate (TESSALON) 100 MG capsule Take 1 capsule (100 mg total) by mouth every 8 (eight) hours. Patient not taking: Reported on 09/20/2022 08/23/22   Lennice Sites, DO  blood glucose meter kit and supplies KIT Dispense based on patient and insurance preference. Use up to four times daily as directed. (FOR ICD-9 250.00, 250.01). 09/08/20   [place] Shi, MD  cefdinir (OMNICEF) 300 MG capsule Take 1 capsule (300 mg total) by mouth 2 (two) times daily. Patient not taking: Reported on 09/20/2022 08/26/22   Tammi Sou, MD  predniSONE (DELTASONE) 20 MG tablet 2 tabs po qd x 5d Patient not taking: Reported on 09/20/2022 09/03/22   [name], MD                                                                                                                                    Past Surgical History Past Surgical History:  Procedure Laterality Date   APPENDECTOMY     BACK SURGERY     CARDIAC CATHETERIZATION N/A 04/01/2015   Procedure: Left Heart Cath and Cors/Grafts Angiography;  Surgeon: Jettie Booze, MD;  Location: [facility];  Service: Cardiovascular;  Laterality: N/A;   CARDIAC CATHETERIZATION N/A 04/01/2015   Procedure: Coronary Stent Intervention;  Surgeon: Jettie Booze, MD;  Location: [facility];  Service: Cardiovascular;  Laterality: N/A;   CARDIOVERSION N/A 11/01/2019   Procedure: CARDIOVERSION;  Surgeon: Geralynn Rile, MD;  Location: [facility];  Service: Cardiovascular;  Laterality: N/A;   CARPAL TUNNEL RELEASE  2004   both hands   CORONARY ARTERY BYPASS GRAFT     CORONARY BALLOON ANGIOPLASTY N/A 08/19/2021   Procedure: CORONARY BALLOON ANGIOPLASTY;  Surgeon: Burnell Blanks, MD;  Location: [facility];  Service: Cardiovascular;  Laterality: N/A;   CORONARY STENT INTERVENTION N/A 09/23/2020   Procedure: CORONARY STENT INTERVENTION;  Surgeon: Sherren Mocha, MD;  Location: [facility];  Service: Cardiovascular;  Laterality: N/A;   CYSTOSCOPY  08/23/12   incomplete emoptying bladder   HERNIA REPAIR     incision and drainage of right chest abscess     INTRAOPERATIVE TRANSTHORACIC ECHOCARDIOGRAM Left 04/28/2021   Procedure: INTRAOPERATIVE TRANSTHORACIC ECHOCARDIOGRAM;  Surgeon: Burnell Blanks, MD;  Location: [facility];  Service: Open Heart Surgery;  Laterality: Left;   LAPAROSCOPIC CHOLECYSTECTOMY  09/2009   LEFT HEART CATH AND CORS/GRAFTS ANGIOGRAPHY N/A 08/19/2021   Procedure: LEFT HEART CATH AND CORS/GRAFTS ANGIOGRAPHY;  Surgeon: Burnell Blanks, MD;  Location: [facility];  Service: Cardiovascular;  Laterality: N/A;   LEFT HEART CATHETERIZATION WITH CORONARY ANGIOGRAM N/A 07/19/2014   Procedure: LEFT HEART CATHETERIZATION WITH CORONARY ANGIOGRAM;  Surgeon: Leonie Man, MD;  [facility];  Service: Cardiovascular;  Laterality: N/A;   RECTAL SURGERY     RIGHT/LEFT HEART CATH AND CORONARY/GRAFT ANGIOGRAPHY N/A 09/23/2020   Procedure: RIGHT/LEFT HEART CATH AND CORONARY/GRAFT ANGIOGRAPHY;  Surgeon: Sherren Mocha, MD;  Location: [facility] LAB;  Service: Cardiovascular;  Laterality: N/A;   ROBOT ASSISTED LAPAROSCOPIC RADICAL PROSTATECTOMY  2005   TRANSCATHETER AORTIC VALVE REPLACEMENT, TRANSFEMORAL Bilateral 04/28/2021   Procedure: TRANSCATHETER AORTIC VALVE REPLACEMENT, TRANSFEMORAL;  Surgeon: Burnell Blanks, MD;   Location: [facility];  Service: Open Heart Surgery;  Laterality: Bilateral;   ULTRASOUND GUIDANCE FOR VASCULAR ACCESS Bilateral 04/28/2021   Procedure: ULTRASOUND GUIDANCE FOR VASCULAR ACCESS;  Surgeon: Burnell Blanks, MD;  Location: [facility];  Service: Open Heart Surgery;  Laterality: Bilateral;   Family History Family History  Problem Relation Age of Onset   Cancer Mother        stomach   Heart attack Father     Social History Social History   Tobacco Use   Smoking status: Former    Packs/day: 0.50    Years: 5.00    Total pack years: 2.50    Types: Cigarettes    Quit date: 07/20/1972    Years since quitting: 50.2   Smokeless tobacco: Never   Tobacco comments:    quit s  Vaping Use   Vaping Use: Never used  Substance Use Topics   Alcohol use: No   Drug use: No    Comment: quit smoking 40 years ago   Allergies Codeine and Morphine  Review of Systems Review of Systems  All other systems reviewed and are negative.   Physical Exam Vital Signs  I have reviewed the triage vital signs BP 129/75   Pulse 77   Temp (!) 97.5 F (36.4 C) (Oral)   Resp 17   Ht _0  (1.753 m)   Wt 72 kg   SpO2 97%   BMI 23.44 kg/m  Physical Exam Vitals and nursing note reviewed.  Constitutional:      General: He is not in acute distress.    Appearance: Normal appearance. He is ill-appearing.  HENT:     Mouth/Throat:     Mouth: Mucous  membranes are moist.  Eyes:     Conjunctiva/sclera: Conjunctivae normal.  Neck:     Vascular: JVD present.  Cardiovascular:     Rate and Rhythm: Normal rate and regular rhythm.  Pulmonary:     Effort: Pulmonary effort is normal. No respiratory distress.     Breath sounds: Examination of the right-lower field reveals rales. Examination of the left-lower field reveals rales. Rales present.  Abdominal:     General: Abdomen is flat.     Palpations: Abdomen is soft.     Tenderness: There is no abdominal tenderness.  Musculoskeletal:     Right  lower leg: Edema present.     Left lower leg: Edema present.  Skin:    General: Skin is warm and dry.     Capillary Refill: Capillary refill takes less than 2 seconds.  Neurological:     Mental Status: He is alert and oriented to person, place, and time. Mental status is at baseline.  Psychiatric:        Mood and Affect: Mood normal.        Behavior: Behavior normal.     ED Results and Treatments Labs (all labs ordered are listed, but only abnormal results are displayed) Labs Reviewed  RESP PANEL BY RT-PCR (FLU A&B, COVID) ARPGX2 - Abnormal; Notable for the following components:      Result Value   SARS Coronavirus 2 by RT PCR POSITIVE (*)    All other components within normal limits  CBC WITH DIFFERENTIAL/PLATELET - Abnormal; Notable for the following components:   WBC 14.0 (*)    RDW 17.3 (*)    Neutro Abs 11.6 (*)    Lymphs Abs 0.6 (*)    Monocytes Absolute 1.1 (*)    Abs Immature Granulocytes 0.16 (*)    All other components within normal limits  BRAIN NATRIURETIC PEPTIDE - Abnormal; Notable for the following components:   B Natriuretic Peptide 612.5 (*)    All other components within normal limits  BASIC METABOLIC PANEL - Abnormal; Notable for the following components:   Sodium 129 (*)    Potassium 5.6 (*)    CO2 19 (*)    Glucose, Bld 178 (*)    BUN 47 (*)    Creatinine, Ser 2.42 (*)    GFR, Estimated 25 (*)    All other components within normal limits  TROPONIN I (HIGH SENSITIVITY) - Abnormal; Notable for the following components:   Troponin I (High Sensitivity) 59 (*)    All other components within normal limits  TROPONIN I (HIGH SENSITIVITY) - Abnormal; Notable for the following components:   Troponin I (High Sensitivity) 54 (*)    All other components within normal limits                                                                                                                          Radiology DG Chest 2 View  Result Date: 09/20/2022 CLINICAL DATA:   Cough EXAM:  CHEST - 2 VIEW COMPARISON:  CT chest 09/02/2021, chest radiograph 08/23/2022 FINDINGS: Status post median sternotomy and CABG. Postprocedural changes from TAVR. No pleural effusion. No pneumothorax. Linear opacities at the left lung base favored to represent atelectasis. There is a more consolidative opacity seen on the lateral view along the lung bases. Normal cardiac and mediastinal contours. Unchanged calcification along the aortic arch. Surgical clips in the right quadrant. Vertebral body heights are maintained. No displaced rib fractures. Degenerative changes at the right-greater-than-left [facility] joint. IMPRESSION: Focal basilar consolidative opacity seen on the lateral view may represent atelectasis, but is suspicious for infection. Electronically Signed   By: Marin Roberts M.D.   On: 09/20/2022 09:23    Pertinent labs & imaging results that were available during my care of the patient were reviewed by me and considered in my medical decision making (see MDM for details).  Medications Ordered in ED Medications  azithromycin (ZITHROMAX) 500 mg in sodium chloride 0.9 % 250 mL IVPB (500 mg Intravenous New Bag/Given 09/20/22 1355)  cefTRIAXone (ROCEPHIN) 1 g in sodium chloride 0.9 % 100 mL IVPB (has no administration in time range)  sodium chloride flush (NS) 0.9 % injection 3 mL (has no administration in time range)  acetaminophen (TYLENOL) tablet 650 mg (has no administration in time range)    Or  acetaminophen (TYLENOL) suppository 650 mg (has no administration in time range)  ondansetron (ZOFRAN) tablet 4 mg (has no administration in time range)    Or  ondansetron (ZOFRAN) injection 4 mg (has no administration in time range)  guaiFENesin (MUCINEX) 12 hr tablet 600 mg (has no administration in time range)  cefTRIAXone (ROCEPHIN) 1 g in sodium chloride 0.9 % 100 mL IVPB (0 g Intravenous Stopped 09/20/22 1355)  furosemide (LASIX) injection 40 mg (40 mg Intravenous Given 09/20/22 1321)                                                                                                                                      Procedures Procedures  (including critical care time)  Medical Decision Making / ED Course   MDM:  88 year old male presenting to the emergency department with shortness of breath.  On exam, patient has bibasilar crackles.  EKG appears similar to baseline.   Suspect COVID pneumonia is primary cause of weakness given positive COVID test.  Chest x-ray also shows possible superimposed bacterial pneumonia and patient does have an elevated WBC count, so we will treat with IV antibiotics.  Patient not hypoxic at rest but even sitting up in bed desaturated to 85%.  He has bibasilar crackles, and some lower extremity edema as well as a history of CHF, and his BNP is elevated currently, so we will also give small extra dose of Lasix.  Patient does have a small AKI, suspect more likely to be cardiorenal than hypovolemic related.  He does have an elevated troponin although this appears  elevated at baseline without significant change.  Doubt ACS, EKG appears similar to prior.  He endorses chest pain for months but no change in this pain.  Discussed with the hospitalist will admit the patient for further management.      Additional history obtained: -Additional history obtained from spouse -External records from outside source obtained and reviewed including: Chart review including previous notes, labs, imaging, consultation notes including ED visit 08/23/22   Lab Tests: -I ordered, reviewed, and interpreted labs.   The pertinent results include:   Labs Reviewed  RESP PANEL BY RT-PCR (FLU A&B, COVID) ARPGX2 - Abnormal; Notable for the following components:      Result Value   SARS Coronavirus 2 by RT PCR POSITIVE (*)    All other components within normal limits  CBC WITH DIFFERENTIAL/PLATELET - Abnormal; Notable for the following components:   WBC 14.0 (*)    RDW 17.3  (*)    Neutro Abs 11.6 (*)    Lymphs Abs 0.6 (*)    Monocytes Absolute 1.1 (*)    Abs Immature Granulocytes 0.16 (*)    All other components within normal limits  BRAIN NATRIURETIC PEPTIDE - Abnormal; Notable for the following components:   B Natriuretic Peptide 612.5 (*)    All other components within normal limits  BASIC METABOLIC PANEL - Abnormal; Notable for the following components:   Sodium 129 (*)    Potassium 5.6 (*)    CO2 19 (*)    Glucose, Bld 178 (*)    BUN 47 (*)    Creatinine, Ser 2.42 (*)    GFR, Estimated 25 (*)    All other components within normal limits  TROPONIN I (HIGH SENSITIVITY) - Abnormal; Notable for the following components:   Troponin I (High Sensitivity) 59 (*)    All other components within normal limits  TROPONIN I (HIGH SENSITIVITY) - Abnormal; Notable for the following components:   Troponin I (High Sensitivity) 54 (*)    All other components within normal limits    Notable for hyponatremia, mild AKI, elevated BNP, elevated troponin, positive COVID test, leukocytosis  EKG   EKG Interpretation  Date/Time:  Monday September 20 2022 08:22:21 EST Ventricular Rate:  84 PR Interval:    QRS Duration: 152 QT Interval:  406 QTC Calculation: 479 R Axis:   88 Text Interpretation: Atrial fibrillation with premature ventricular or aberrantly conducted complexes Left bundle branch block Abnormal ECG No significant change since last tracing Confirmed by Garnette Gunner [phone]) on 09/20/2022 1:03:23 PM         Imaging Studies ordered: I ordered imaging studies including chest x-ray On my interpretation imaging demonstrates trace suspicious for pneumonia I independently visualized and interpreted imaging. I agree with the radiologist interpretation   Medicines ordered and prescription drug management: Meds ordered this encounter  Medications   cefTRIAXone (ROCEPHIN) 1 g in sodium chloride 0.9 % 100 mL IVPB    Order Specific Question:   Antibiotic  Indication:    Answer:   CAP   azithromycin (ZITHROMAX) 500 mg in sodium chloride 0.9 % 250 mL IVPB   furosemide (LASIX) injection 40 mg   cefTRIAXone (ROCEPHIN) 1 g in sodium chloride 0.9 % 100 mL IVPB    Order Specific Question:   Antibiotic Indication:    Answer:   CAP   DISCONTD: heparin injection 5,000 Units   sodium chloride flush (NS) 0.9 % injection 3 mL   OR Linked Order Group    acetaminophen (TYLENOL) tablet  650 mg    acetaminophen (TYLENOL) suppository 650 mg   OR Linked Order Group    ondansetron (ZOFRAN) tablet 4 mg    ondansetron (ZOFRAN) injection 4 mg   guaiFENesin (MUCINEX) 12 hr tablet 600 mg    -I have reviewed the patients home medicines and have made adjustments as needed   Consultations Obtained: I requested consultation with the hospitalist,  and discussed lab and imaging findings as well as pertinent plan - they recommend: admission   Cardiac Monitoring: The patient was maintained on a cardiac monitor.  I personally viewed and interpreted the cardiac monitored which showed an underlying rhythm of: afib  Social Determinants of Health:  Diagnosis or treatment significantly limited by social determinants of health: former smoker   Reevaluation: After the interventions noted above, I reevaluated the patient and found that they have improved  Co morbidities that complicate the patient evaluation  Past Medical History:  Diagnosis Date   Aortic stenosis, moderate 09/20/2020   Arthritis    CAD (coronary artery disease)    a. Cath September 2015 LIMA to the LAD patent, SVG to PDA patent, SVG to posterior lateral patent, SVG to OM with a 90% in-stent restenosis at an anastomotic lesion. This was treated with angioplasty. b. cath 04/01/2015 95% ISR in SVG to OM treated with 2.75x24 Synergy DES postdilated to 3.35m, all other grafts patent   Cataract    surgery,B/L   CHF (congestive heart failure) (HCC)    Chronic kidney disease    nephrolithiasis   Diabetes  mellitus    TYPE 2   GERD (gastroesophageal reflux disease)    Hernia    Hypercholesterolemia    Hypertension    Incontinence    hx  over 1 year,leaks without awareness   Other and unspecified diseases of appendix    PAF (paroxysmal atrial fibrillation) ([place]    in the setting of ischemia on 03/31/2015   Pancreatitis    Prostate CA ([place] 03/01/04   prostate bx=Adenocarcinoam,gleason 3+4=7,PSA=6.75   Shortness of breath dyspnea    Ulcer    hx gastric      Dispostion: Disposition decision including need for hospitalization was considered, and patient admitted to the hospital.    Final Clinical Impression(s) / ED Diagnoses Final diagnoses:  Pneumonia due to COVID-19 virus  Hypervolemia, unspecified hypervolemia type     This chart was dictated using voice recognition software.  Despite best efforts to proofread,  errors can occur which can change the documentation meaning.    SCristie Hem MD 09/20/22 1435

## 2022-09-20 NOTE — ED Provider Triage Note (Signed)
Emergency Medicine Provider Triage Evaluation Note  [name] , a 88 y.o. male  was evaluated in triage.  Pt complains of chest pain and shortness of breath x 1 week.  Also notes increased dyspnea on exertion.  Review of Systems  Positive: Weakness Negative: No vomiting or diarrhea  Physical Exam  BP (!) 115/52   Pulse 63   Resp 18   Ht 1.753 m ('5\' 9"'$ )   Wt 72 kg   SpO2 97%   BMI 23.44 kg/m  Gen:   Awake, no distress   Resp:  Normal effort  MSK:   Moves extremities without difficulty  Other:    Medical Decision Making  Medically screening exam initiated at 8:41 AM.  Appropriate orders placed.  [name] was informed that the remainder of the evaluation will be completed by another provider, this initial triage assessment does not replace that evaluation, and the importance of remaining in the ED until their evaluation is complete.     Shawn Leigh, MD 09/20/22 [phone]

## 2022-09-20 NOTE — ED Triage Notes (Signed)
Pt endorses CP and SOB for months but worsened this past week. Pain and breathing worse with exertion. No leg swelling noticed. Unable to get temp in triage.

## 2022-09-20 NOTE — ED Notes (Signed)
Pt in room wit wife. Mepilex applied to sacrum. Updated on plan of care.

## 2022-09-21 ENCOUNTER — Inpatient Hospital Stay (HOSPITAL_COMMUNITY): Payer: Medicare PPO

## 2022-09-21 DIAGNOSIS — R7989 Other specified abnormal findings of blood chemistry: Secondary | ICD-10-CM | POA: Diagnosis not present

## 2022-09-21 DIAGNOSIS — U071 COVID-19: Secondary | ICD-10-CM

## 2022-09-21 DIAGNOSIS — J1282 Pneumonia due to coronavirus disease 2019: Secondary | ICD-10-CM | POA: Diagnosis not present

## 2022-09-21 LAB — CBC WITH DIFFERENTIAL/PLATELET
Abs Immature Granulocytes: 0.13 10*3/uL — ABNORMAL HIGH (ref 0.00–0.07)
Basophils Absolute: 0 10*3/uL (ref 0.0–0.1)
Basophils Relative: 0 %
Eosinophils Absolute: 0.5 10*3/uL (ref 0.0–0.5)
Eosinophils Relative: 4 %
HCT: 43.3 % (ref 39.0–52.0)
Hemoglobin: 14.2 g/dL (ref 13.0–17.0)
Immature Granulocytes: 1 %
Lymphocytes Relative: 5 %
Lymphs Abs: 0.5 10*3/uL — ABNORMAL LOW (ref 0.7–4.0)
MCH: 26.8 pg (ref 26.0–34.0)
MCHC: 32.8 g/dL (ref 30.0–36.0)
MCV: 81.7 fL (ref 80.0–100.0)
Monocytes Absolute: 0.8 10*3/uL (ref 0.1–1.0)
Monocytes Relative: 6 %
Neutro Abs: 10 10*3/uL — ABNORMAL HIGH (ref 1.7–7.7)
Neutrophils Relative %: 84 %
Platelets: 127 10*3/uL — ABNORMAL LOW (ref 150–400)
RBC: 5.3 MIL/uL (ref 4.22–5.81)
RDW: 17.3 % — ABNORMAL HIGH (ref 11.5–15.5)
WBC: 12 10*3/uL — ABNORMAL HIGH (ref 4.0–10.5)
nRBC: 0 % (ref 0.0–0.2)

## 2022-09-21 LAB — CREATININE, URINE, RANDOM: Creatinine, Urine: 79 mg/dL

## 2022-09-21 LAB — COMPREHENSIVE METABOLIC PANEL
ALT: 27 U/L (ref 0–44)
AST: 18 U/L (ref 15–41)
Albumin: 2.7 g/dL — ABNORMAL LOW (ref 3.5–5.0)
Alkaline Phosphatase: 116 U/L (ref 38–126)
Anion gap: 12 (ref 5–15)
BUN: 47 mg/dL — ABNORMAL HIGH (ref 8–23)
CO2: 17 mmol/L — ABNORMAL LOW (ref 22–32)
Calcium: 9.7 mg/dL (ref 8.9–10.3)
Chloride: 102 mmol/L (ref 98–111)
Creatinine, Ser: 2.35 mg/dL — ABNORMAL HIGH (ref 0.61–1.24)
GFR, Estimated: 26 mL/min — ABNORMAL LOW (ref >60)
Glucose, Bld: 103 mg/dL — ABNORMAL HIGH (ref 70–99)
Potassium: 5.1 mmol/L (ref 3.5–5.1)
Sodium: 131 mmol/L — ABNORMAL LOW (ref 135–145)
Total Bilirubin: 0.8 mg/dL (ref 0.3–1.2)
Total Protein: 7.5 g/dL (ref 6.5–8.1)

## 2022-09-21 LAB — CBG MONITORING, ED
Glucose-Capillary: 129 mg/dL — ABNORMAL HIGH (ref 70–99)
Glucose-Capillary: 297 mg/dL — ABNORMAL HIGH (ref 70–99)

## 2022-09-21 LAB — D-DIMER, QUANTITATIVE: D-Dimer, Quant: 0.47 ug/mL-FEU (ref 0.00–0.50)

## 2022-09-21 LAB — BRAIN NATRIURETIC PEPTIDE: B Natriuretic Peptide: 592.2 pg/mL — ABNORMAL HIGH (ref 0.0–100.0)

## 2022-09-21 LAB — URINALYSIS, ROUTINE W REFLEX MICROSCOPIC
Bacteria, UA: NONE SEEN
Bilirubin Urine: NEGATIVE
Glucose, UA: NEGATIVE mg/dL
Hgb urine dipstick: NEGATIVE
Ketones, ur: NEGATIVE mg/dL
Leukocytes,Ua: NEGATIVE
Nitrite: NEGATIVE
Protein, ur: 30 mg/dL — AB
Specific Gravity, Urine: 1.015 (ref 1.005–1.030)
pH: 5 (ref 5.0–8.0)

## 2022-09-21 LAB — GLUCOSE, CAPILLARY
Glucose-Capillary: 230 mg/dL — ABNORMAL HIGH (ref 70–99)
Glucose-Capillary: 231 mg/dL — ABNORMAL HIGH (ref 70–99)

## 2022-09-21 LAB — MAGNESIUM: Magnesium: 2 mg/dL (ref 1.7–2.4)

## 2022-09-21 LAB — PHOSPHORUS: Phosphorus: 3.8 mg/dL (ref 2.5–4.6)

## 2022-09-21 LAB — C-REACTIVE PROTEIN: CRP: 12.5 mg/dL — ABNORMAL HIGH (ref <1.0)

## 2022-09-21 MED ORDER — METHYLPREDNISOLONE SODIUM SUCC 125 MG IJ SOLR
60.0000 mg | Freq: Every day | INTRAMUSCULAR | Status: DC
  Administered 2022-09-21 – 2022-09-22 (×2): 60 mg via INTRAVENOUS
  Filled 2022-09-21 (×2): qty 2

## 2022-09-21 MED ORDER — INSULIN GLARGINE-YFGN 100 UNIT/ML ~~LOC~~ SOLN
15.0000 [IU] | Freq: Every day | SUBCUTANEOUS | Status: DC
  Administered 2022-09-21 – 2022-09-24 (×4): 15 [IU] via SUBCUTANEOUS
  Filled 2022-09-21 (×4): qty 0.15

## 2022-09-21 MED ORDER — AZITHROMYCIN 500 MG PO TABS
500.0000 mg | ORAL_TABLET | Freq: Every day | ORAL | Status: AC
  Administered 2022-09-21 – 2022-09-23 (×3): 500 mg via ORAL
  Filled 2022-09-21 (×2): qty 1
  Filled 2022-09-21: qty 2

## 2022-09-21 MED ORDER — SODIUM ZIRCONIUM CYCLOSILICATE 10 G PO PACK
10.0000 g | PACK | Freq: Once | ORAL | Status: AC
  Administered 2022-09-21: 10 g via ORAL
  Filled 2022-09-21: qty 1

## 2022-09-21 MED ORDER — INSULIN ASPART 100 UNIT/ML IJ SOLN
0.0000 [IU] | Freq: Three times a day (TID) | INTRAMUSCULAR | Status: DC
  Administered 2022-09-21: 5 [IU] via SUBCUTANEOUS
  Administered 2022-09-21: 2 [IU] via SUBCUTANEOUS
  Administered 2022-09-21 – 2022-09-22 (×2): 8 [IU] via SUBCUTANEOUS
  Administered 2022-09-22: 3 [IU] via SUBCUTANEOUS
  Administered 2022-09-22: 5 [IU] via SUBCUTANEOUS
  Administered 2022-09-23 (×2): 3 [IU] via SUBCUTANEOUS
  Administered 2022-09-23: 8 [IU] via SUBCUTANEOUS
  Administered 2022-09-24: 2 [IU] via SUBCUTANEOUS

## 2022-09-21 MED ORDER — SODIUM CHLORIDE 0.9 % IV SOLN: INTRAVENOUS | Status: AC

## 2022-09-21 NOTE — Progress Notes (Signed)
BLE venous duplex has been completed.   Results can be found under chart review under CV PROC. 09/21/2022 3:42 PM Bethania Schlotzhauer RVT, RDMS

## 2022-09-21 NOTE — Progress Notes (Signed)
TRH night cross cover note:   I was notified by RN that patient continuing to c/o pleuritic chest discomfort associated with ongoing cough in setting of being admitted today for covid pna. He is receiving prn acetaminophen at this time, and I have also ordered prn Tessalon Perles for his cough.     Babs Bertin, DO Hospitalist

## 2022-09-21 NOTE — ED Notes (Signed)
Pt resting comfortably in bed, respirations are even and non-labored. NAD. Call bell is within reach.

## 2022-09-21 NOTE — Progress Notes (Signed)
PROGRESS NOTE                                                                                                                                                                                                             Patient Demographics:    [name], is a 88 y.o. male, DOB - May 20, 1933, LAG:[id number]  Outpatient Primary MD for the patient is [name], MD    LOS - 1  Admit date - 09/20/2022    Chief Complaint  Patient presents with   Chest Pain   Shortness of Breath       Brief Narrative (HPI from H&P)   88 y.o. male with medical history significant of HTN, HLD, PAF on Eliquis, CHF, CAD s/pCABG, DM type II, prostate cancer who presents with worsening cough and shortness of breath over the last 2-3 days.    Was diagnosed with COVID-19 infection/early pneumonia along with dehydration and AKI and admitted to the hospital.   Subjective:    [name] today has, No headache, No chest pain, No abdominal pain - No Nausea, No new weakness tingling or numbness, improved SOB   Assessment  & Plan :   COVID-19 pneumonia with possible superimposed bacterial bronchitis.  He is weak and frail with underlying CKD stage IIIb and chronic systolic heart failure, agree with antiviral that was started in the ER will continue, since CRP is elevated will add IV steroids, continue azithromycin for bacterial bronchitis.      Chest pain elevated troponin - Chronic.  Patient reports having substernal chest pain.  High-sensitivity troponins 59-> 54.  EKG similar to prior without significant ischemic changes.  Suspect secondary to demand in the setting of acute respiratory infection.  Continue combination of Coreg, statin and Eliquis for secondary prevention.  Pain improved now.   Acute kidney injury superimposed on chronic kidney disease stage IIIb - dehydrated, Baseline creatinine previously noted to be around 1.71 last month.   Hydrated.   Chr. Systolic congestive heart failure - Chronic.  Appears dehydrated currently, gentle hydration, last echocardiogram noted EF to be 35 -40% with global hypokinesis of the left ventricle.  Tolerate ACE/ARB due to underlying CKD stage IIIb.   Chronic atrial fibrillation on chronic anticoagulation - CHA2DS2-VASc 2 score of greater than 3.  On Coreg and Eliquis.   Hyponatremia - Due  to dehydration hydrate and monitor hold diuretics   Essential hypertension - Held diuretics, continue Coreg and monitor.   GERD -Continue Protonix  Controlled diabetes mellitus type 2, with hyperlipidemia - Lantus and sliding scale, since he is on steroids will monitor closely.  Januvia held.  [name] held.  Lab Results  Component Value Date   HGBA1C 6.9 (A) 07/28/2022   CBG (last 3)  Recent Labs    09/20/22 2125 09/21/22 0756  GLUCAP 182* 129*         Condition - Extremely Guarded  Family Communication  : Called wife [name] [phone] on 09/21/2022    Code Status :  Full  Consults  :  None  PUD Prophylaxis :    Procedures  :     Leg [place]      Disposition Plan  :    Status is: Inpatient  DVT Prophylaxis  :    apixaban (ELIQUIS) tablet 2.5 mg Start: 09/20/22 2200 apixaban (ELIQUIS) tablet 2.5 mg     Lab Results  Component Value Date   PLT 127 (L) 09/21/2022    Diet :  Diet Order             Diet heart healthy/carb modified Room service appropriate? Yes; Fluid consistency: Thin  Diet effective now                    Inpatient Medications  Scheduled Meds:  albuterol  2.5 mg Nebulization Q6H   apixaban  2.5 mg Oral BID   carvedilol  6.25 mg Oral BID WC   FLUoxetine  10 mg Oral Daily   guaiFENesin  600 mg Oral BID   insulin aspart  0-15 Units Subcutaneous TID WC   insulin glargine-yfgn  15 Units Subcutaneous Daily   methylPREDNISolone (SOLU-MEDROL) injection  60 mg Intravenous Daily   molnupiravir EUA  4 capsule Oral BID   mometasone-formoterol  2  puff Inhalation BID   pantoprazole  40 mg Oral Daily   rosuvastatin  20 mg Oral Daily   sodium chloride flush  3 mL Intravenous Q12H   sodium zirconium cyclosilicate  10 g Oral Once   Continuous Infusions:  sodium chloride 100 mL/hr at 09/21/22 0825   azithromycin (ZITHROMAX) 500 mg in sodium chloride 0.9 % 250 mL IVPB     cefTRIAXone (ROCEPHIN)  IV     PRN Meds:.acetaminophen **OR** acetaminophen, benzonatate, docusate sodium, ondansetron **OR** ondansetron (ZOFRAN) IV    Objective:   Vitals:   09/21/22 0400 09/21/22 0445 09/21/22 0700 09/21/22 0756  BP: 112/66 126/60 (!) 140/75 (!) 144/76  Pulse: 71 80 78 72  Resp: '13 20 16 '$ (!) 23  Temp: 97.8 F (36.6 C)   97.7 F (36.5 C)  TempSrc: Oral     SpO2: 94% 96% 96% 96%  Weight:      Height:        Wt Readings from Last 3 Encounters:  09/20/22 72 kg  09/03/22 72.2 kg  08/31/22 72.8 kg     Intake/Output Summary (Last 24 hours) at 09/21/2022 0839 Last data filed at 09/20/2022 2110 Gross per 24 hour  Intake 100 ml  Output 850 ml  Net -750 ml     Physical Exam  Awake Alert, No new F.N deficits, Normal affect [place].AT,PERRAL Supple Neck, No JVD,   Symmetrical Chest wall movement, Good air movement bilaterally, CTAB RRR,No Gallops,Rubs or new Murmurs,  +ve B.Sounds, Abd Soft, No tenderness,   No Cyanosis, Clubbing or edema  Data Review:    Recent Labs  Lab 09/20/22 0849 09/21/22 0441  WBC 14.0* 12.0*  HGB 13.5 14.2  HCT 41.9 43.3  PLT 174 127*  MCV 82.2 81.7  MCH 26.5 26.8  MCHC 32.2 32.8  RDW 17.3* 17.3*  LYMPHSABS 0.6* 0.5*  MONOABS 1.1* 0.8  EOSABS 0.5 0.5  BASOSABS 0.1 0.0    Recent Labs  Lab 09/20/22 0849 09/20/22 1302 09/20/22 1851 09/21/22 0441 09/21/22 0545  NA  --  129*  --  131*  --   K  --  5.6*  --  5.1  --   CL  --  99  --  102  --   CO2  --  19*  --  17*  --   GLUCOSE  --  178*  --  103*  --   BUN  --  47*  --  47*  --   CREATININE  --  2.42*  --  2.35*  --   AST  --   --    --  18  --   ALT  --   --   --  27  --   ALKPHOS  --   --   --  116  --   BILITOT  --   --   --  0.8  --   ALBUMIN  --   --   --  2.7*  --   CRP  --   --  13.5* 12.5*  --   DDIMER  --   --  0.58*  --  0.47  PROCALCITON  --   --  0.13  --   --   BNP 612.5*  --   --  592.2*  --   MG  --   --   --  2.0  --   CALCIUM  --  9.5  --  9.7  --       Recent Labs  Lab 09/20/22 0849 09/20/22 1302 09/20/22 1851 09/21/22 0441 09/21/22 0545  CRP  --   --  13.5* 12.5*  --   DDIMER  --   --  0.58*  --  0.47  PROCALCITON  --   --  0.13  --   --   BNP 612.5*  --   --  592.2*  --   MG  --   --   --  2.0  --   CALCIUM  --  9.5  --  9.7  --     Radiology Reports DG Chest Port 1 View  Result Date: 09/21/2022 CLINICAL DATA:  Shortness of breath.  COVID positive. EXAM: PORTABLE CHEST 1 VIEW COMPARISON:  09/20/2022 FINDINGS: The lungs are clear without focal pneumonia, edema, pneumothorax or pleural effusion. Chronic atelectasis or scarring at the left base is stable. The cardio pericardial silhouette is enlarged. Interstitial markings are diffusely coarsened with chronic features. Bones are diffusely demineralized. Status post CABG. Telemetry leads overlie the chest. IMPRESSION: Stable chronic atelectasis or scarring at the left base. Superimposed pneumonia cannot be excluded. Electronically Signed   By: Misty Stanley M.D.   On: 09/21/2022 06:27   DG Chest 2 View  Result Date: 09/20/2022 CLINICAL DATA:  Cough EXAM: CHEST - 2 VIEW COMPARISON:  CT chest 09/02/2021, chest radiograph 08/23/2022 FINDINGS: Status post median sternotomy and CABG. Postprocedural changes from TAVR. No pleural effusion. No pneumothorax. Linear opacities at the left lung base favored to represent atelectasis. There is a more consolidative opacity seen on the  lateral view along the lung bases. Normal cardiac and mediastinal contours. Unchanged calcification along the aortic arch. Surgical clips in the right quadrant. Vertebral body  heights are maintained. No displaced rib fractures. Degenerative changes at the right-greater-than-left [facility] joint. IMPRESSION: Focal basilar consolidative opacity seen on the lateral view may represent atelectasis, but is suspicious for infection. Electronically Signed   By: Marin Roberts M.D.   On: 09/20/2022 09:23      Signature  -   Lala Lund M.D on 09/21/2022 at 8:39 AM   -  To page go to [email or web address]

## 2022-09-22 DIAGNOSIS — U071 COVID-19: Secondary | ICD-10-CM | POA: Diagnosis not present

## 2022-09-22 DIAGNOSIS — J1282 Pneumonia due to coronavirus disease 2019: Secondary | ICD-10-CM | POA: Diagnosis not present

## 2022-09-22 LAB — COMPREHENSIVE METABOLIC PANEL
ALT: 23 U/L (ref 0–44)
AST: 15 U/L (ref 15–41)
Albumin: 2.4 g/dL — ABNORMAL LOW (ref 3.5–5.0)
Alkaline Phosphatase: 104 U/L (ref 38–126)
Anion gap: 9 (ref 5–15)
BUN: 38 mg/dL — ABNORMAL HIGH (ref 8–23)
CO2: 17 mmol/L — ABNORMAL LOW (ref 22–32)
Calcium: 9.1 mg/dL (ref 8.9–10.3)
Chloride: 101 mmol/L (ref 98–111)
Creatinine, Ser: 1.81 mg/dL — ABNORMAL HIGH (ref 0.61–1.24)
GFR, Estimated: 36 mL/min — ABNORMAL LOW (ref >60)
Glucose, Bld: 177 mg/dL — ABNORMAL HIGH (ref 70–99)
Potassium: 4.3 mmol/L (ref 3.5–5.1)
Sodium: 127 mmol/L — ABNORMAL LOW (ref 135–145)
Total Bilirubin: 0.6 mg/dL (ref 0.3–1.2)
Total Protein: 6.4 g/dL — ABNORMAL LOW (ref 6.5–8.1)

## 2022-09-22 LAB — CBC WITH DIFFERENTIAL/PLATELET
Abs Immature Granulocytes: 0.07 10*3/uL (ref 0.00–0.07)
Basophils Absolute: 0 10*3/uL (ref 0.0–0.1)
Basophils Relative: 0 %
Eosinophils Absolute: 0 10*3/uL (ref 0.0–0.5)
Eosinophils Relative: 0 %
HCT: 34.5 % — ABNORMAL LOW (ref 39.0–52.0)
Hemoglobin: 11.5 g/dL — ABNORMAL LOW (ref 13.0–17.0)
Immature Granulocytes: 1 %
Lymphocytes Relative: 3 %
Lymphs Abs: 0.4 10*3/uL — ABNORMAL LOW (ref 0.7–4.0)
MCH: 26.3 pg (ref 26.0–34.0)
MCHC: 33.3 g/dL (ref 30.0–36.0)
MCV: 78.8 fL — ABNORMAL LOW (ref 80.0–100.0)
Monocytes Absolute: 0.3 10*3/uL (ref 0.1–1.0)
Monocytes Relative: 3 %
Neutro Abs: 11.9 10*3/uL — ABNORMAL HIGH (ref 1.7–7.7)
Neutrophils Relative %: 93 %
Platelets: 108 10*3/uL — ABNORMAL LOW (ref 150–400)
RBC: 4.38 MIL/uL (ref 4.22–5.81)
RDW: 16.9 % — ABNORMAL HIGH (ref 11.5–15.5)
WBC: 12.7 10*3/uL — ABNORMAL HIGH (ref 4.0–10.5)
nRBC: 0 % (ref 0.0–0.2)

## 2022-09-22 LAB — OSMOLALITY: Osmolality: 289 mOsm/kg (ref 275–295)

## 2022-09-22 LAB — URIC ACID: Uric Acid, Serum: 6.3 mg/dL (ref 3.7–8.6)

## 2022-09-22 LAB — GLUCOSE, CAPILLARY
Glucose-Capillary: 201 mg/dL — ABNORMAL HIGH (ref 70–99)
Glucose-Capillary: 155 mg/dL — ABNORMAL HIGH (ref 70–99)
Glucose-Capillary: 270 mg/dL — ABNORMAL HIGH (ref 70–99)
Glucose-Capillary: 229 mg/dL — ABNORMAL HIGH (ref 70–99)

## 2022-09-22 LAB — CREATININE, URINE, RANDOM: Creatinine, Urine: 96 mg/dL

## 2022-09-22 LAB — UREA NITROGEN, URINE: Urea Nitrogen, Ur: 555 mg/dL

## 2022-09-22 LAB — SODIUM, URINE, RANDOM: Sodium, Ur: 33 mmol/L

## 2022-09-22 LAB — MAGNESIUM: Magnesium: 1.9 mg/dL (ref 1.7–2.4)

## 2022-09-22 LAB — OSMOLALITY, URINE: Osmolality, Ur: 597 mOsm/kg (ref 300–900)

## 2022-09-22 LAB — D-DIMER, QUANTITATIVE: D-Dimer, Quant: 0.53 ug/mL-FEU — ABNORMAL HIGH (ref 0.00–0.50)

## 2022-09-22 LAB — C-REACTIVE PROTEIN: CRP: 9.4 mg/dL — ABNORMAL HIGH (ref <1.0)

## 2022-09-22 MED ORDER — LACTATED RINGERS IV SOLN: INTRAVENOUS | Status: AC

## 2022-09-22 MED ORDER — METHYLPREDNISOLONE SODIUM SUCC 40 MG IJ SOLR
40.0000 mg | Freq: Every day | INTRAMUSCULAR | Status: DC
  Administered 2022-09-23 – 2022-09-24 (×2): 40 mg via INTRAVENOUS
  Filled 2022-09-22 (×2): qty 1

## 2022-09-22 MED ORDER — SODIUM CHLORIDE 1 G PO TABS
2.0000 g | ORAL_TABLET | Freq: Two times a day (BID) | ORAL | Status: AC
  Administered 2022-09-22 (×2): 2 g via ORAL
  Filled 2022-09-22 (×2): qty 2

## 2022-09-22 MED ORDER — ALBUTEROL SULFATE (2.5 MG/3ML) 0.083% IN NEBU: 2.5000 mg | INHALATION_SOLUTION | Freq: Four times a day (QID) | RESPIRATORY_TRACT | Status: DC | PRN

## 2022-09-22 NOTE — TOC Initial Note (Addendum)
Transition of Care [facility]) - Initial/Assessment Note    Patient Details  Name: [name] MRN: [id number] Date of Birth: 02-05-1933  [facility] CM/SW Contact:    Carles Collet, RN Phone Number: 09/22/2022, 2:24 PM  Clinical Narrative:                 Spoke w patient and wife over the phoe. They are agreeable to [facility] services, Discussed providers and they would like to try to [facility], and if possible his VA benefits. He last was assigned to the [place] in [place], [place] and has not transitioned to a local office, however he does have a local PCP.  Declined 3/1 Needs HH PT OT orders please   Expected Discharge Plan: [place] Barriers to Discharge: Continued Medical Work up   Patient Goals and CMS Choice Patient states their goals for this hospitalization and ongoing recovery are:: to go home w home health services CMS Medicare.gov Compare Post Acute Care list provided to:: Patient Choice offered to / list presented to : Patient  Expected Discharge Plan and Services Expected Discharge Plan: [place]   Discharge Planning Services: CM Consult Post Acute Care Choice: [place] arrangements for the past 2 months: Single Family Home                           HH Arranged: PT, OT HH Agency: [facility] Date [facility] Agency Contacted: 09/22/22 Time [place]: [id number] Representative spoke with at [place]: Kerry Dory  Prior Living Arrangements/Services Living arrangements for the past 2 months: [place] with:: Spouse   Do you feel safe going back to the place where you live?: Yes               Activities of Daily Living      Permission Sought/Granted                  Emotional Assessment              Admission diagnosis:  Hypervolemia, unspecified hypervolemia type [E87.70] COVID-19 virus infection [U07.1] Pneumonia due to COVID-19 virus [U07.1, J12.82] Patient Active Problem  List   Diagnosis Date Noted   Pneumonia due to COVID-19 virus 09/20/2022   Hyperkalemia 09/20/2022   Acute kidney injury superimposed on chronic kidney disease ([place]) 09/20/2022   Hyponatremia 09/20/2022   Leukocytosis 09/20/2022   Stage 3b chronic kidney disease (CKD) ([place]) 02/06/2022   Type 2 diabetes mellitus with hyperlipidemia (HCC)    Anemia    Atrial fibrillation, chronic (HCC)    Acute on chronic systolic CHF (congestive heart failure) ([place]) 02/05/2022   Depression, recurrent ([place]) 09/30/2021   NSTEMI (non-ST elevated myocardial infarction) ([place])    S/P TAVR (transcatheter aortic valve replacement) 08/18/2021   Unstable angina ([place]) 08/18/2021   Severe aortic stenosis 04/29/2021   Aortic stenosis, severe 04/27/2021   Angina at rest 09/20/2020   Chronic diastolic (congestive) heart failure ([place]) 09/20/2020   COPD with acute bronchitis ([place]) 09/08/2020   Acute exacerbation of CHF (congestive heart failure) ([place]) 09/07/2020   COPD mixed type ([place]) 62/70/3500   Chronic systolic CHF (congestive heart failure) ([place]) 93/81/8299   Systolic dysfunction with acute on chronic heart failure ([place]) 10/23/2019   Educated about COVID-19 virus infection 10/23/2019   Persistent atrial fibrillation ([place]) 08/29/2019   Nonrheumatic aortic valve stenosis 08/29/2019   Elevated troponin 07/27/2019  HLD (hyperlipidemia) 07/27/2019   GERD (gastroesophageal reflux disease) 07/27/2019   Congestive heart failure (CHF) ([place]) 07/26/2019   Pure hypercholesterolemia    Acute on chronic diastolic CHF (congestive heart failure) (HCC)    SOB (shortness of breath) 06/04/2019   CKD (chronic kidney disease), stage III ([place]) 06/04/2019   Coronary artery disease of native artery of native heart with stable angina pectoris ([place]) 03/24/2018   Bilateral carotid artery stenosis 07/23/2017   Palpitation 07/23/2017   Urinary urgency 07/28/2016   PAF (paroxysmal atrial fibrillation) (HCC)    DOE (dyspnea on  exertion) 07/17/2014   Acute on chronic renal insufficiency- ACE and diuretics held 08/65/7846   Diastolic dysfunction- moderate on echo 2013 07/17/2014   Aortic stenosis 07/17/2014   LBBB (left bundle branch block) 07/17/2014   Chest pain 07/17/2014   Crescendo angina ([place]) 07/16/2014   CKD (chronic kidney disease) stage 4, GFR 15-29 ml/min ([place]) 12/03/2013   Squamous cell cancer of skin of hand 05/31/2013   HEAD TRAUMA, CLOSED 09/24/2010   Ischemic cardiomyopathy- last EF Normal Myoview Jan 2015 04/02/2010   INSOMNIA 04/02/2010   Moderate bilateral ICA disease by doppler June 2015 01/08/2010   DYSPHAGIA UNSPECIFIED 09/05/2009   Hx GERD/PUD 09/03/2009   CHOLELITHIASIS, WITH CHOLECYSTITIS 09/03/2009   ARTHRITIS 07/16/2009   HYPOKALEMIA 11/06/2008   CAD in native artery 11/06/2008   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 ([place]) 11/23/2007   Dyslipidemia 11/23/2007   ROSACEA 11/23/2007   DYSPNEA ON EXERTION 11/23/2007   UNS ADVRS EFF OTH RX MEDICINAL&BIOLOGICAL SBSTNC 11/23/2007   Hypertension 11/22/2007   Hx of Prostate ca 03/01/2004   PCP:  [name], MD Pharmacy:   [id number] [place] [id number]: [phone]: [phone]    Social Determinants of Health (SDOH) Interventions    Readmission Risk Interventions    02/08/2022   10:44 AM  Readmission Risk Prevention Plan  Transportation Screening Complete  PCP or Specialist Appt within 3-5 Days Complete  [place] or [place]  Social Work Consult for [place]/Counseling Complete  Palliative Care Screening Not Applicable  Medication Review (Press photographer Complete

## 2022-09-22 NOTE — Plan of Care (Signed)
  Problem: Education: Goal: Knowledge of risk factors and measures for prevention of condition will improve Outcome: Progressing   Problem: Coping: Goal: Psychosocial and spiritual needs will be supported Outcome: Progressing   Problem: Respiratory: Goal: Will maintain a patent airway Outcome: Progressing Goal: Complications related to the disease process, condition or treatment will be avoided or minimized Outcome: Progressing   Problem: Education: Goal: Ability to describe self-care measures that may prevent or decrease complications (Diabetes Survival Skills Education) will improve Outcome: Progressing Goal: Individualized Educational Video(s) Outcome: Progressing   Problem: Coping: Goal: Ability to adjust to condition or change in health will improve Outcome: Progressing   Problem: Fluid Volume: Goal: Ability to maintain a balanced intake and output will improve Outcome: Progressing   Problem: Health Behavior/Discharge Planning: Goal: Ability to identify and utilize available resources and services will improve Outcome: Progressing Goal: Ability to manage health-related needs will improve Outcome: Progressing   Problem: Metabolic: Goal: Ability to maintain appropriate glucose levels will improve Outcome: Progressing   Problem: Nutritional: Goal: Maintenance of adequate nutrition will improve Outcome: Progressing Goal: Progress toward achieving an optimal weight will improve Outcome: Progressing   Problem: Skin Integrity: Goal: Risk for impaired skin integrity will decrease Outcome: Progressing   Problem: Tissue Perfusion: Goal: Adequacy of tissue perfusion will improve Outcome: Progressing   Problem: Education: Goal: Knowledge of General Education information will improve Description: Including pain rating scale, medication(s)/side effects and non-pharmacologic comfort measures Outcome: Progressing   Problem: Health Behavior/Discharge Planning: Goal:  Ability to manage health-related needs will improve Outcome: Progressing   Problem: Clinical Measurements: Goal: Ability to maintain clinical measurements within normal limits will improve Outcome: Progressing Goal: Will remain free from infection Outcome: Progressing Goal: Diagnostic test results will improve Outcome: Progressing Goal: Respiratory complications will improve Outcome: Progressing Goal: Cardiovascular complication will be avoided Outcome: Progressing   Problem: Activity: Goal: Risk for activity intolerance will decrease Outcome: Progressing   Problem: Nutrition: Goal: Adequate nutrition will be maintained Outcome: Progressing   Problem: Coping: Goal: Level of anxiety will decrease Outcome: Progressing   Problem: Elimination: Goal: Will not experience complications related to bowel motility Outcome: Progressing Goal: Will not experience complications related to urinary retention Outcome: Progressing   Problem: Pain Managment: Goal: General experience of comfort will improve Outcome: Progressing   Problem: Safety: Goal: Ability to remain free from injury will improve Outcome: Progressing   Problem: Skin Integrity: Goal: Risk for impaired skin integrity will decrease Outcome: Progressing

## 2022-09-22 NOTE — Evaluation (Signed)
Physical Therapy Evaluation Patient Details Name: [name] MRN: [id number] DOB: 1933/05/23 Today's Date: 09/22/2022  History of Present Illness  Patient 88 y.o. who presents with worsening cough and shortness of breath over the last 2-3 days.  Patient reports that the cough is productive and he has been significantly short of breath with any kind of exertion.PMH includes HTN, HLD, PAF on Eliquis, CHF, CAD s/p CABG, DM type II, prostate cancer.   Clinical Impression  [name] is 88 y.o. male admitted with above HPI and diagnosis. Patient is currently limited by functional impairments below (see PT problem list). Patient lives with his wife and is independent with occasional use of SPC at baseline. Currently he requires min guard/assist for transfers and gait with RW and is limited by generalized weakness and mild SOB with activity. SOB decreases with seated rest. Patient has good assist at home from spouse. Patient will benefit from continued skilled PT interventions to address impairments and progress independence with mobility, recommending HHPT. Acute PT will follow and progress as able.        Recommendations for follow up therapy are one component of a multi-disciplinary discharge planning process, led by the attending physician.  Recommendations may be updated based on patient status, additional functional criteria and insurance authorization.  Follow Up Recommendations Home health PT      Assistance Recommended at Discharge Frequent or constant Supervision/Assistance  Patient can return home with the following  A little help with walking and/or transfers;A little help with bathing/dressing/bathroom;Assistance with cooking/housework;Direct supervision/assist for medications management;Assist for transportation;Help with stairs or ramp for entrance    Equipment Recommendations Rolling walker (2 wheels)  Recommendations for Other Services       Functional Status Assessment  Patient has had a recent decline in their functional status and demonstrates the ability to make significant improvements in function in a reasonable and predictable amount of time.     Precautions / Restrictions Precautions Precautions: Fall Restrictions Weight Bearing Restrictions: No      Mobility  Bed Mobility               General bed mobility comments: OOB to recliner    Transfers Overall transfer level: Needs assistance Equipment used: Rolling walker (2 wheels) Transfers: Sit to/from Stand Sit to Stand: Min guard           General transfer comment: cues for safe hand placement for power up with use of bil UE from recliner. cues to reach back to control lowering to chair.    Ambulation/Gait Ambulation/Gait assistance: Min assist, Min guard Gait Distance (Feet): 25 Feet Assistive device: Rolling walker (2 wheels) Gait Pattern/deviations: Step-through pattern, Decreased stride length, Shuffle, Trunk flexed Gait velocity: decr     General Gait Details: pt overall steady, cues at start for safe position of RW, pt able to maintain throughout and progress to min guard for forward gait. Assist needed to turn to negotiate tight space of hospital room.  Stairs            Wheelchair Mobility    Modified Rankin (Stroke Patients Only)       Balance Overall balance assessment: Needs assistance Sitting-balance support: Feet supported Sitting balance-Leahy Scale: Good     Standing balance support: Bilateral upper extremity supported, During functional activity Standing balance-Leahy Scale: Fair  Pertinent Vitals/Pain Pain Assessment Pain Assessment: No/denies pain    Home Living Family/patient expects to be discharged to:: Private residence Living Arrangements: Spouse/significant other Available Help at Discharge: Family;Available 24 hours/day Type of Home: House Home Access: Stairs to enter Entrance  Stairs-Rails: None Entrance Stairs-Number of Steps: 2 in the front vs none in the back   Home Layout: Two level;Able to live on main level with bedroom/bathroom Home Equipment: Kasandra Knudsen - single point      Prior Function Prior Level of Function : Independent/Modified Independent             Mobility Comments: Uses SPC as needed. Hx of falls ADLs Comments: Wife helps with dressing, no longer drives, and does not participate with iADL     Hand Dominance   Dominant Hand: Right    Extremity/Trunk Assessment   Upper Extremity Assessment Upper Extremity Assessment: Defer to OT evaluation    Lower Extremity Assessment Lower Extremity Assessment: Generalized weakness (5xsit<>stand 28 seoncds, with bil UE use)    Cervical / Trunk Assessment Cervical / Trunk Assessment: Kyphotic  Communication   Communication: No difficulties  Cognition Arousal/Alertness: Awake/alert Behavior During Therapy: WFL for tasks assessed/performed Overall Cognitive Status: Within Functional Limits for tasks assessed                                          General Comments      Exercises     Assessment/Plan    PT Assessment Patient needs continued PT services  PT Problem List Decreased strength;Decreased activity tolerance;Decreased balance;Decreased mobility;Decreased knowledge of use of DME;Cardiopulmonary status limiting activity       PT Treatment Interventions DME instruction;Gait training;Stair training;Functional mobility training;Therapeutic activities;Therapeutic exercise;Balance training;Patient/family education    PT Goals (Current goals can be found in the Care Plan section)  Acute Rehab PT Goals Patient Stated Goal: recover and get home PT Goal Formulation: With patient/family Time For Goal Achievement: 10/06/22 Potential to Achieve Goals: Good    Frequency Min 3X/week     Co-evaluation               AM-PAC PT "6 Clicks" Mobility  Outcome Measure  Help needed turning from your back to your side while in a flat bed without using bedrails?: A Little Help needed moving from lying on your back to sitting on the side of a flat bed without using bedrails?: A Little Help needed moving to and from a bed to a chair (including a wheelchair)?: A Little Help needed standing up from a chair using your arms (e.g., wheelchair or bedside chair)?: A Little Help needed to walk in hospital room?: A Little Help needed climbing 3-5 steps with a railing? : A Little 6 Click Score: 18    End of Session Equipment Utilized During Treatment: Gait belt Activity Tolerance: Patient tolerated treatment well Patient left: in chair;with call bell/phone within reach;with family/visitor present Nurse Communication: Mobility status PT Visit Diagnosis: Muscle weakness (generalized) (M62.81);Difficulty in walking, not elsewhere classified (R26.2);Unsteadiness on feet (R26.81)    Time: 9622-2979 PT Time Calculation (min) (ACUTE ONLY): 24 min   Charges:   PT Evaluation $PT Eval Low Complexity: 1 Low PT Treatments $Gait Training: 8-22 mins        Verner Mould, DPT Acute Rehabilitation Services Office [phone]  09/22/22 2:28 PM

## 2022-09-22 NOTE — Progress Notes (Signed)
PROGRESS NOTE                                                                                                                                                                                                             Patient Demographics:    [name], is a 88 y.o. male, DOB - [birth year], HRC:[id number]  Outpatient Primary MD for the patient is [name], MD    LOS - 1  Admit date - 09/20/2022    Chief Complaint  Patient presents with   Chest Pain   Shortness of Breath       Brief Narrative (HPI from H&P)   88 y.o. male with medical history significant of HTN, HLD, PAF on Eliquis, CHF, CAD s/pCABG, DM type II, prostate cancer who presents with worsening cough and shortness of breath over the last 2-3 days.    Was diagnosed with COVID-19 infection/early pneumonia along with dehydration and AKI and admitted to the hospital.   Subjective:   Patient in bed, appears comfortable, denies any headache, no fever, no chest pain or pressure, no shortness of breath , no abdominal pain. No new focal weakness.    Assessment  & Plan :   COVID-19 pneumonia with possible superimposed bacterial bronchitis.  He is weak and frail with underlying CKD stage IIIb and chronic systolic heart failure, agree with antiviral that was started in the ER will continue, since CRP is elevated will add IV steroids, continue azithromycin for bacterial bronchitis.      Chest pain elevated troponin - Chronic.  Patient reports having substernal chest pain.  High-sensitivity troponins 59-> 54.  EKG similar to prior without significant ischemic changes.  Suspect secondary to demand in the setting of acute respiratory infection.  Continue combination of Coreg, statin and Eliquis for secondary prevention.  Pain improved now.   Acute kidney injury superimposed on chronic kidney disease stage IIIb - dehydrated, Baseline creatinine previously noted to be  around 1.71 last month, improving with IV fluids continue.   Chr. Systolic congestive heart failure - Chronic.  Appears dehydrated currently, gentle hydration, last echocardiogram noted EF to be 35 -40% with global hypokinesis of the left ventricle.  Tolerate ACE/ARB due to underlying CKD stage IIIb.   Chronic atrial fibrillation on chronic anticoagulation - CHA2DS2-VASc 2 score of greater than 3.  On Coreg  and Eliquis.   Hyponatremia - Due to dehydration hydrate and monitor hold diuretics   Essential hypertension - Held diuretics, continue Coreg and monitor.   GERD -Continue Protonix  Controlled diabetes mellitus type 2, with hyperlipidemia - Lantus and sliding scale, since he is on steroids will monitor closely.  Januvia held.  [name] held.  Lab Results  Component Value Date   HGBA1C 6.9 (A) 07/28/2022   CBG (last 3)  Recent Labs    09/20/22 2125 09/21/22 0756  GLUCAP 182* 129*         Condition - Extremely Guarded  Family Communication  : Called wife [name] [phone] on 09/21/2022    Code Status :  Full  Consults  :  None  PUD Prophylaxis :    Procedures  :     Leg [place]      Disposition Plan  :    Status is: Inpatient  DVT Prophylaxis  :    apixaban (ELIQUIS) tablet 2.5 mg Start: 09/20/22 2200 apixaban (ELIQUIS) tablet 2.5 mg     Lab Results  Component Value Date   PLT 127 (L) 09/21/2022    Diet :  Diet Order             Diet heart healthy/carb modified Room service appropriate? Yes; Fluid consistency: Thin  Diet effective now                    Inpatient Medications  Scheduled Meds:  albuterol  2.5 mg Nebulization Q6H   apixaban  2.5 mg Oral BID   carvedilol  6.25 mg Oral BID WC   FLUoxetine  10 mg Oral Daily   guaiFENesin  600 mg Oral BID   insulin aspart  0-15 Units Subcutaneous TID WC   insulin glargine-yfgn  15 Units Subcutaneous Daily   methylPREDNISolone (SOLU-MEDROL) injection  60 mg Intravenous Daily   molnupiravir EUA   4 capsule Oral BID   mometasone-formoterol  2 puff Inhalation BID   pantoprazole  40 mg Oral Daily   rosuvastatin  20 mg Oral Daily   sodium chloride flush  3 mL Intravenous Q12H   sodium zirconium cyclosilicate  10 g Oral Once   Continuous Infusions:  sodium chloride 100 mL/hr at 09/21/22 0825   azithromycin (ZITHROMAX) 500 mg in sodium chloride 0.9 % 250 mL IVPB     cefTRIAXone (ROCEPHIN)  IV     PRN Meds:.acetaminophen **OR** acetaminophen, benzonatate, docusate sodium, ondansetron **OR** ondansetron (ZOFRAN) IV    Objective:   Vitals:   09/21/22 0400 09/21/22 0445 09/21/22 0700 09/21/22 0756  BP: 112/66 126/60 (!) 140/75 (!) 144/76  Pulse: 71 80 78 72  Resp: '13 20 16 '$ (!) 23  Temp: 97.8 F (36.6 C)   97.7 F (36.5 C)  TempSrc: Oral     SpO2: 94% 96% 96% 96%  Weight:      Height:        Wt Readings from Last 3 Encounters:  09/20/22 72 kg  09/03/22 72.2 kg  08/31/22 72.8 kg     Intake/Output Summary (Last 24 hours) at 09/21/2022 0839 Last data filed at 09/20/2022 2110 Gross per 24 hour  Intake 100 ml  Output 850 ml  Net -750 ml     Physical Exam  Awake Alert, No new F.N deficits, Normal affect [place].AT,PERRAL Supple Neck, No JVD,   Symmetrical Chest wall movement, Good air movement bilaterally, CTAB RRR,No Gallops, Rubs or new Murmurs,  +ve B.Sounds, Abd Soft, No tenderness,  No Cyanosis, Clubbing or edema       Data Review:    Recent Labs  Lab 09/20/22 0849 09/21/22 0441  WBC 14.0* 12.0*  HGB 13.5 14.2  HCT 41.9 43.3  PLT 174 127*  MCV 82.2 81.7  MCH 26.5 26.8  MCHC 32.2 32.8  RDW 17.3* 17.3*  LYMPHSABS 0.6* 0.5*  MONOABS 1.1* 0.8  EOSABS 0.5 0.5  BASOSABS 0.1 0.0    Recent Labs  Lab 09/20/22 0849 09/20/22 1302 09/20/22 1851 09/21/22 0441 09/21/22 0545  NA  --  129*  --  131*  --   K  --  5.6*  --  5.1  --   CL  --  99  --  102  --   CO2  --  19*  --  17*  --   GLUCOSE  --  178*  --  103*  --   BUN  --  47*  --  47*  --    CREATININE  --  2.42*  --  2.35*  --   AST  --   --   --  18  --   ALT  --   --   --  27  --   ALKPHOS  --   --   --  116  --   BILITOT  --   --   --  0.8  --   ALBUMIN  --   --   --  2.7*  --   CRP  --   --  13.5* 12.5*  --   DDIMER  --   --  0.58*  --  0.47  PROCALCITON  --   --  0.13  --   --   BNP 612.5*  --   --  592.2*  --   MG  --   --   --  2.0  --   CALCIUM  --  9.5  --  9.7  --       Recent Labs  Lab 09/20/22 0849 09/20/22 1302 09/20/22 1851 09/21/22 0441 09/21/22 0545  CRP  --   --  13.5* 12.5*  --   DDIMER  --   --  0.58*  --  0.47  PROCALCITON  --   --  0.13  --   --   BNP 612.5*  --   --  592.2*  --   MG  --   --   --  2.0  --   CALCIUM  --  9.5  --  9.7  --     Radiology Reports DG Chest Port 1 View  Result Date: 09/21/2022 CLINICAL DATA:  Shortness of breath.  COVID positive. EXAM: PORTABLE CHEST 1 VIEW COMPARISON:  09/20/2022 FINDINGS: The lungs are clear without focal pneumonia, edema, pneumothorax or pleural effusion. Chronic atelectasis or scarring at the left base is stable. The cardio pericardial silhouette is enlarged. Interstitial markings are diffusely coarsened with chronic features. Bones are diffusely demineralized. Status post CABG. Telemetry leads overlie the chest. IMPRESSION: Stable chronic atelectasis or scarring at the left base. Superimposed pneumonia cannot be excluded. Electronically Signed   By: Misty Stanley M.D.   On: 09/21/2022 06:27   DG Chest 2 View  Result Date: 09/20/2022 CLINICAL DATA:  Cough EXAM: CHEST - 2 VIEW COMPARISON:  CT chest 09/02/2021, chest radiograph 08/23/2022 FINDINGS: Status post median sternotomy and CABG. Postprocedural changes from TAVR. No pleural effusion. No pneumothorax. Linear opacities at the left lung base favored to  represent atelectasis. There is a more consolidative opacity seen on the lateral view along the lung bases. Normal cardiac and mediastinal contours. Unchanged calcification along the aortic  arch. Surgical clips in the right quadrant. Vertebral body heights are maintained. No displaced rib fractures. Degenerative changes at the right-greater-than-left [facility] joint. IMPRESSION: Focal basilar consolidative opacity seen on the lateral view may represent atelectasis, but is suspicious for infection. Electronically Signed   By: Marin Roberts M.D.   On: 09/20/2022 09:23      Signature  -   Lala Lund M.D on 09/21/2022 at 8:39 AM   -  To page go to [email or web address]

## 2022-09-22 NOTE — Evaluation (Signed)
Occupational Therapy Evaluation Patient Details Name: [name] MRN: [id number] DOB: 11-19-1932 Today's Date: 09/22/2022   History of Present Illness 88 y.o. male with medical history significant of HTN, HLD, PAF on Eliquis, CHF, CAD s/pCABG, DM type II, prostate cancer who presents with worsening cough and shortness of breath over the last 2-3 days.  Patient reports that the cough is productive and he has been significantly short of breath with any kind of exertion.   Clinical Impression   Patient admitted for the diagnosis above.  PTA he lives at home with his spouse, who assists with community mobility, iADL and lower body ADL.  Generalized weakness and fair dynamic balance are the deficits.  Currently he is needing up to [facility] for basic mobility and upper body ADL seated.  OT will continue efforts in the acute setting, with [facility] OT recommended for post acute rehab.        Recommendations for follow up therapy are one component of a multi-disciplinary discharge planning process, led by the attending physician.  Recommendations may be updated based on patient status, additional functional criteria and insurance authorization.   Follow Up Recommendations  Home health OT     Assistance Recommended at Discharge Intermittent Supervision/Assistance  Patient can return home with the following A little help with walking and/or transfers;Assist for transportation;Assistance with cooking/housework;A little help with bathing/dressing/bathroom    Functional Status Assessment  Patient has had a recent decline in their functional status and demonstrates the ability to make significant improvements in function in a reasonable and predictable amount of time.  Equipment Recommendations  Tub/shower seat    Recommendations for Other Services       Precautions / Restrictions Precautions Precautions: Fall Restrictions Weight Bearing Restrictions: No      Mobility Bed Mobility Overal bed  mobility: Needs Assistance Bed Mobility: Supine to Sit     Supine to sit: Supervision          Transfers Overall transfer level: Needs assistance Equipment used: 1 person hand held assist Transfers: Sit to/from Stand, Bed to chair/wheelchair/BSC Sit to Stand: Min guard     Step pivot transfers: Min guard            Balance Overall balance assessment: Needs assistance Sitting-balance support: Feet supported Sitting balance-Leahy Scale: Good     Standing balance support: Single extremity supported Standing balance-Leahy Scale: Fair                             ADL either performed or assessed with clinical judgement   ADL       Grooming: Wash/dry hands;Wash/dry face;Supervision/safety;Sitting   Upper Body Bathing: Supervision/ safety;Sitting       Upper Body Dressing : Set up;Sitting       Toilet Transfer: Min guard;Stand-pivot;BSC/3in1   Toileting- Clothing Manipulation and Hygiene: Supervision/safety;Sit to/from stand               Vision Baseline Vision/History: 1 Wears glasses Patient Visual Report: No change from baseline       Perception     Praxis      Pertinent Vitals/Pain Pain Assessment Pain Assessment: No/denies pain     Hand Dominance Right   Extremity/Trunk Assessment Upper Extremity Assessment Upper Extremity Assessment: LUE deficits/detail LUE Deficits / Details: chronic shoulder dysfunction LUE Sensation: WNL LUE Coordination: WNL   Lower Extremity Assessment Lower Extremity Assessment: Defer to PT evaluation   Cervical / Trunk Assessment  Cervical / Trunk Assessment: Kyphotic   Communication Communication Communication: No difficulties   Cognition Arousal/Alertness: Awake/alert Behavior During Therapy: WFL for tasks assessed/performed Overall Cognitive Status: Within Functional Limits for tasks assessed                                 General Comments: HOH     General Comments    VSS on RA    Exercises     Shoulder Instructions      Home Living Family/patient expects to be discharged to:: Private residence Living Arrangements: Spouse/significant other Available Help at Discharge: Family;Available 24 hours/day Type of Home: House Home Access: Stairs to enter [employer] of Steps: 2 in the front vs none in the back Entrance Stairs-Rails: None Home Layout: Two level;Able to live on main level with bedroom/bathroom     Bathroom Shower/Tub: Occupational psychologist: Handicapped height Bathroom Accessibility: Yes How Accessible: Accessible via walker Home Equipment: [place] - single point;Rolling Walker (2 wheels);BSC/3in1          Prior Functioning/Environment Prior Level of Function : Independent/Modified Independent             Mobility Comments: Uses SPC as needed. Hx of falls ADLs Comments: Wife helps with dressing, no longer drives, and does not participate with iADL        OT Problem List: Decreased strength;Decreased activity tolerance;Impaired balance (sitting and/or standing)      OT Treatment/Interventions: Self-care/ADL training;Therapeutic activities;Patient/family education;Balance training    OT Goals(Current goals can be found in the care plan section) Acute Rehab OT Goals Patient Stated Goal: Return home OT Goal Formulation: With patient Time For Goal Achievement: 10/06/22 Potential to Achieve Goals: Good ADL Goals Pt Will Perform Grooming: with modified independence;standing Pt Will Perform Upper Body Bathing: with modified independence;sitting Pt Will Perform Upper Body Dressing: with modified independence;sitting Pt Will Transfer to Toilet: with modified independence;ambulating;regular height toilet Pt Will Perform Toileting - Clothing Manipulation and hygiene: with modified independence;sit to/from stand  OT Frequency: Min 2X/week    Co-evaluation              AM-PAC OT "6 Clicks" Daily  Activity     Outcome Measure Help from another person eating meals?: None Help from another person taking care of personal grooming?: None Help from another person toileting, which includes using toliet, bedpan, or urinal?: A Little Help from another person bathing (including washing, rinsing, drying)?: A Lot Help from another person to put on and taking off regular upper body clothing?: A Little Help from another person to put on and taking off regular lower body clothing?: A Lot 6 Click Score: 18   End of Session Equipment Utilized During Treatment: Gait belt Nurse Communication: Mobility status  Activity Tolerance: Patient tolerated treatment well Patient left: in chair;with call bell/phone within reach;with family/visitor present  OT Visit Diagnosis: Unsteadiness on feet (R26.81);Muscle weakness (generalized) (M62.81)                Time: 1000-1020 OT Time Calculation (min): 20 min Charges:  OT General Charges $OT Visit: 1 Visit OT Evaluation $OT Eval Moderate Complexity: 1 Mod  09/22/2022  RP, OTR/L  Acute Rehabilitation Services  Office:  [phone]   Metta Clines 09/22/2022, 10:28 AM

## 2022-09-22 NOTE — Consult Note (Signed)
   [facility] CM Inpatient Consult   09/22/2022  [name] 20-Mar-1933 [id number]  [place] [facility] [ACO] Patient: [name]  Primary Care Provider:  [name], MD with [facility] [facility]  Patient screened for history of previously active with [facility], for hospitalization with noted high risk score for unplanned readmission risk to assess for potential [place] Management service needs for post hospital transition for care coordination.  Review of patient's electronic medical record reveals patient has been outreached by [place] Coordinator in the past month but not in an active CC program. Currently, admitted with COVID, on airborne precautions.  Patient's electronic medical record was reviewed for progress and potential needs.  Plan:  Continue to follow progress and disposition to assess for post hospital community care coordination/management needs.  Referral request for community care coordination: no needs anticipated at this time, will follow.  Of note, [facility] Care Management/Population Health does not replace or interfere with any arrangements made by the Inpatient Transition of Care team.  For questions contact:   Natividad Brood, RN BSN [place]  [phone] business mobile phone Toll free office [phone]  *[place]  [phone] Fax number: [phone] [email or web address]

## 2022-09-23 DIAGNOSIS — J1282 Pneumonia due to coronavirus disease 2019: Secondary | ICD-10-CM | POA: Diagnosis not present

## 2022-09-23 DIAGNOSIS — U071 COVID-19: Secondary | ICD-10-CM | POA: Diagnosis not present

## 2022-09-23 LAB — COMPREHENSIVE METABOLIC PANEL
ALT: 24 U/L (ref 0–44)
AST: 17 U/L (ref 15–41)
Albumin: 2.5 g/dL — ABNORMAL LOW (ref 3.5–5.0)
Alkaline Phosphatase: 102 U/L (ref 38–126)
Anion gap: 7 (ref 5–15)
BUN: 26 mg/dL — ABNORMAL HIGH (ref 8–23)
CO2: 19 mmol/L — ABNORMAL LOW (ref 22–32)
Calcium: 9.9 mg/dL (ref 8.9–10.3)
Chloride: 107 mmol/L (ref 98–111)
Creatinine, Ser: 1.54 mg/dL — ABNORMAL HIGH (ref 0.61–1.24)
GFR, Estimated: 43 mL/min — ABNORMAL LOW (ref >60)
Glucose, Bld: 209 mg/dL — ABNORMAL HIGH (ref 70–99)
Potassium: 4.6 mmol/L (ref 3.5–5.1)
Sodium: 133 mmol/L — ABNORMAL LOW (ref 135–145)
Total Bilirubin: 0.6 mg/dL (ref 0.3–1.2)
Total Protein: 6.5 g/dL (ref 6.5–8.1)

## 2022-09-23 LAB — GLUCOSE, CAPILLARY
Glucose-Capillary: 190 mg/dL — ABNORMAL HIGH (ref 70–99)
Glucose-Capillary: 265 mg/dL — ABNORMAL HIGH (ref 70–99)
Glucose-Capillary: 157 mg/dL — ABNORMAL HIGH (ref 70–99)

## 2022-09-23 LAB — CBC WITH DIFFERENTIAL/PLATELET
Abs Immature Granulocytes: 0.07 10*3/uL (ref 0.00–0.07)
Basophils Absolute: 0 10*3/uL (ref 0.0–0.1)
Basophils Relative: 0 %
Eosinophils Absolute: 0 10*3/uL (ref 0.0–0.5)
Eosinophils Relative: 0 %
HCT: 35.5 % — ABNORMAL LOW (ref 39.0–52.0)
Hemoglobin: 12 g/dL — ABNORMAL LOW (ref 13.0–17.0)
Immature Granulocytes: 1 %
Lymphocytes Relative: 3 %
Lymphs Abs: 0.4 10*3/uL — ABNORMAL LOW (ref 0.7–4.0)
MCH: 27.3 pg (ref 26.0–34.0)
MCHC: 33.8 g/dL (ref 30.0–36.0)
MCV: 80.7 fL (ref 80.0–100.0)
Monocytes Absolute: 0.5 10*3/uL (ref 0.1–1.0)
Monocytes Relative: 4 %
Neutro Abs: 12.4 10*3/uL — ABNORMAL HIGH (ref 1.7–7.7)
Neutrophils Relative %: 92 %
Platelets: 110 10*3/uL — ABNORMAL LOW (ref 150–400)
RBC: 4.4 MIL/uL (ref 4.22–5.81)
RDW: 17.1 % — ABNORMAL HIGH (ref 11.5–15.5)
WBC: 13.4 10*3/uL — ABNORMAL HIGH (ref 4.0–10.5)
nRBC: 0 % (ref 0.0–0.2)

## 2022-09-23 LAB — D-DIMER, QUANTITATIVE: D-Dimer, Quant: 0.47 ug/mL-FEU (ref 0.00–0.50)

## 2022-09-23 LAB — C-REACTIVE PROTEIN: CRP: 3.1 mg/dL — ABNORMAL HIGH (ref <1.0)

## 2022-09-23 LAB — MAGNESIUM: Magnesium: 2 mg/dL (ref 1.7–2.4)

## 2022-09-23 MED ORDER — LACTATED RINGERS IV SOLN: INTRAVENOUS | Status: AC

## 2022-09-23 NOTE — TOC Progression Note (Addendum)
Transition of Care [facility]) - Progression Note    Patient Details  Name: [name] MRN: [id number] Date of Birth: 02-Jun-1933  Transition of Care [facility]) CM/SW [place], RN Phone Number: 09/23/2022, 12:12 PM  Clinical Narrative:     RW ordered via adapt.Home Health orders in, set up with [facility] previously, Expected DC in AM Expected Discharge Plan: [place] Barriers to Discharge: No Barriers Identified  Expected Discharge Plan and Services Expected Discharge Plan: [place]   Discharge Planning Services: CM Consult Post Acute Care Choice: [place] arrangements for the past 2 months: Single Family Home                 DME Arranged: Walker rolling DME Agency: [employer] Date DME Agency Contacted: 09/23/22 Time DME Agency Contacted: 1212 Representative spoke with at DME Agency: Cyril Mourning HH Arranged: PT, OT [place] Agency: [facility] Date [place]: 09/22/22 Time [place]: 1422 Representative spoke with at [place]: [place]) Interventions    Readmission Risk Interventions    02/08/2022   10:44 AM  Readmission Risk Prevention Plan  Transportation Screening Complete  PCP or Specialist Appt within 3-5 Days Complete  HRI or [place] Complete  Social Work Consult for [place] Planning/Counseling Complete  Palliative Care Screening Not Applicable  Medication Review Press photographer) Complete

## 2022-09-23 NOTE — Progress Notes (Signed)
PROGRESS NOTE                                                                                                                                                                                                             Patient Demographics:    [name], is a 88 y.o. male, DOB - 09/14/1933, BSW:[id number]  Outpatient Primary MD for the patient is [name], MD    LOS - 3  Admit date - 09/20/2022    Chief Complaint  Patient presents with   Chest Pain   Shortness of Breath       Brief Narrative (HPI from H&P)   88 y.o. male with medical history significant of HTN, HLD, PAF on Eliquis, CHF, CAD s/pCABG, DM type II, prostate cancer who presents with worsening cough and shortness of breath over the last 2-3 days.    Was diagnosed with COVID-19 infection/early pneumonia along with dehydration and AKI and admitted to the hospital.   Subjective:   Patient in bed, appears comfortable, denies any headache, no fever, no chest pain or pressure, no shortness of breath , no abdominal pain. No new focal weakness.   Assessment  & Plan :   COVID-19 pneumonia with possible superimposed bacterial bronchitis.  He is weak and frail with underlying CKD stage IIIb and chronic systolic heart failure, agree with antiviral that was started in the ER will continue, since CRP is elevated will add IV steroids, continue azithromycin for bacterial bronchitis.   Clinically he is much improved, continue to advance activity likely discharge home with home health on 09/24/2022.   Chest pain elevated troponin - Chronic.  Patient reports having substernal chest pain.  High-sensitivity troponins 59-> 54.  EKG similar to prior without significant ischemic changes.  Suspect secondary to demand in the setting of acute respiratory infection.  Continue combination of Coreg, statin and Eliquis for secondary prevention.  Pain improved now.   Acute kidney  injury superimposed on chronic kidney disease stage IIIb - dehydrated, Baseline creatinine previously noted to be around 1.71 last month, improving with IV fluids continue.   Chr. Systolic congestive heart failure - Chronic.  Appears dehydrated currently, gentle hydration, last echocardiogram noted EF to be 35 -40% with global hypokinesis of the left ventricle.  Tolerate ACE/ARB due to underlying CKD stage IIIb.   Chronic atrial  fibrillation on chronic anticoagulation - CHA2DS2-VASc 2 score of greater than 3.  On Coreg and Eliquis.   Hyponatremia - Due to dehydration hydrate and monitor hold diuretics   Essential hypertension - Held diuretics, continue Coreg and monitor.   GERD -Continue Protonix  Controlled diabetes mellitus type 2, with hyperlipidemia - Lantus and sliding scale, since he is on steroids will monitor closely.  Januvia held.  [name] held.  Lab Results  Component Value Date   HGBA1C 6.9 (A) 07/28/2022   CBG (last 3)  Recent Labs    09/22/22 1543 09/22/22 2057 09/23/22 0810  GLUCAP 270* 229* 190*         Condition - Extremely Guarded  Family Communication  : Called wife [name] [phone] on 09/21/2022, wife bedside 09/23/2022  Code Status :  Full  Consults  :  None  PUD Prophylaxis :    Procedures  :     Leg [place] - No DVT      Disposition Plan  :    Status is: Inpatient  DVT Prophylaxis  :    apixaban (ELIQUIS) tablet 2.5 mg Start: 09/20/22 2200 apixaban (ELIQUIS) tablet 2.5 mg     Lab Results  Component Value Date   PLT 110 (L) 09/23/2022    Diet :  Diet Order             Diet heart healthy/carb modified Room service appropriate? Yes; Fluid consistency: Thin; Fluid restriction: 1200 mL Fluid  Diet effective now                    Inpatient Medications  Scheduled Meds:  apixaban  2.5 mg Oral BID   carvedilol  6.25 mg Oral BID WC   FLUoxetine  10 mg Oral Daily   guaiFENesin  600 mg Oral BID   insulin aspart  0-15 Units  Subcutaneous TID WC   insulin glargine-yfgn  15 Units Subcutaneous Daily   methylPREDNISolone (SOLU-MEDROL) injection  40 mg Intravenous Daily   molnupiravir EUA  4 capsule Oral BID   mometasone-formoterol  2 puff Inhalation BID   pantoprazole  40 mg Oral Daily   rosuvastatin  20 mg Oral Daily   sodium chloride flush  3 mL Intravenous Q12H   Continuous Infusions:  lactated ringers     PRN Meds:.acetaminophen **OR** acetaminophen, albuterol, benzonatate, docusate sodium, ondansetron **OR** ondansetron (ZOFRAN) IV    Objective:   Vitals:   09/23/22 0000 09/23/22 0400 09/23/22 0800 09/23/22 0836  BP: (!) 125/58 (!) 133/58 (!) 144/58   Pulse: (!) 56 (!) 55 (!) 54   Resp: '12 15 20   '$ Temp: 98.2 F (36.8 C) 97.8 F (36.6 C) 97.9 F (36.6 C)   TempSrc: Oral Oral Oral   SpO2: 96% 94% 95% 96%  Weight:      Height:        Wt Readings from Last 3 Encounters:  09/20/22 72 kg  09/03/22 72.2 kg  08/31/22 72.8 kg     Intake/Output Summary (Last 24 hours) at 09/23/2022 0952 Last data filed at 09/22/2022 2000 Gross per 24 hour  Intake --  Output 450 ml  Net -450 ml     Physical Exam  Awake Alert, No new F.N deficits, Normal affect [place].AT,PERRAL Supple Neck, No JVD,   Symmetrical Chest wall movement, Good air movement bilaterally, CTAB RRR,No Gallops, Rubs or new Murmurs,  +ve B.Sounds, Abd Soft, No tenderness,   No Cyanosis, Clubbing or edema  Data Review:    Recent Labs  Lab 09/20/22 0849 09/21/22 0441 09/22/22 0401 09/23/22 0758  WBC 14.0* 12.0* 12.7* 13.4*  HGB 13.5 14.2 11.5* 12.0*  HCT 41.9 43.3 34.5* 35.5*  PLT 174 127* 108* 110*  MCV 82.2 81.7 78.8* 80.7  MCH 26.5 26.8 26.3 27.3  MCHC 32.2 32.8 33.3 33.8  RDW 17.3* 17.3* 16.9* 17.1*  LYMPHSABS 0.6* 0.5* 0.4* 0.4*  MONOABS 1.1* 0.8 0.3 0.5  EOSABS 0.5 0.5 0.0 0.0  BASOSABS 0.1 0.0 0.0 0.0    Recent Labs  Lab 09/20/22 0849 09/20/22 1302 09/20/22 1851 09/21/22 0441 09/21/22 0545  09/22/22 0401 09/23/22 0758  NA  --  129*  --  131*  --  127* 133*  K  --  5.6*  --  5.1  --  4.3 4.6  CL  --  99  --  102  --  101 107  CO2  --  19*  --  17*  --  17* 19*  GLUCOSE  --  178*  --  103*  --  177* 209*  BUN  --  47*  --  47*  --  38* 26*  CREATININE  --  2.42*  --  2.35*  --  1.81* 1.54*  AST  --   --   --  18  --  15 17  ALT  --   --   --  27  --  23 24  ALKPHOS  --   --   --  116  --  104 102  BILITOT  --   --   --  0.8  --  0.6 0.6  ALBUMIN  --   --   --  2.7*  --  2.4* 2.5*  CRP  --   --  13.5* 12.5*  --  9.4* 3.1*  DDIMER  --   --  0.58*  --  0.47 0.53* 0.47  PROCALCITON  --   --  0.13  --   --   --   --   BNP 612.5*  --   --  592.2*  --   --   --   MG  --   --   --  2.0  --  1.9 2.0  CALCIUM  --  9.5  --  9.7  --  9.1 9.9      Recent Labs  Lab 09/20/22 0849 09/20/22 1302 09/20/22 1851 09/21/22 0441 09/21/22 0545 09/22/22 0401 09/23/22 0758  CRP  --   --  13.5* 12.5*  --  9.4* 3.1*  DDIMER  --   --  0.58*  --  0.47 0.53* 0.47  PROCALCITON  --   --  0.13  --   --   --   --   BNP 612.5*  --   --  592.2*  --   --   --   MG  --   --   --  2.0  --  1.9 2.0  CALCIUM  --  9.5  --  9.7  --  9.1 9.9    Radiology Reports VAS [place] LOWER EXTREMITY VENOUS (DVT)  Result Date: 09/21/2022  Lower Venous DVT Study Patient Name:  KAIRYN OLMEDA Carstarphen  Date of Exam:   09/21/2022 Medical Rec #: [id number]     Accession #:    [id number] Date of Birth: [birth year]     Patient Gender: M Patient Age:   60 years Exam Location:  [facility] Procedure:  VAS [place] LOWER EXTREMITY VENOUS (DVT) Referring Phys: Deno Etienne [facility] --------------------------------------------------------------------------------  Indications: "Rapidly rising D-dimer" however D-dimer actually decreasing and within normal range. (09/20/22 0.58 & 09/21/22 0.47).  Risk Factors: Covid. Limitations: Poor ultrasound/tissue interface and calcific shadowing. Comparison Study: No previous exams Performing Technologist: Jody  Hill RVT, RDMS  Examination Guidelines: A complete evaluation includes B-mode imaging, spectral Doppler, color Doppler, and power Doppler as needed of all accessible portions of each vessel. Bilateral testing is considered an integral part of a complete examination. Limited examinations for reoccurring indications may be performed as noted. The reflux portion of the exam is performed with the patient in reverse Trendelenburg.  +---------+---------------+---------+-----------+----------+-------------------+ RIGHT    CompressibilityPhasicitySpontaneityPropertiesThrombus Aging      +---------+---------------+---------+-----------+----------+-------------------+ CFV      Full           No       Yes                  pulsatile flow      +---------+---------------+---------+-----------+----------+-------------------+ SFJ      Full                                                             +---------+---------------+---------+-----------+----------+-------------------+ FV Prox  Full           Yes      Yes                                      +---------+---------------+---------+-----------+----------+-------------------+ FV Mid   Full           Yes      Yes                                      +---------+---------------+---------+-----------+----------+-------------------+ FV DistalFull           Yes      Yes                                      +---------+---------------+---------+-----------+----------+-------------------+ PFV      Full                                                             +---------+---------------+---------+-----------+----------+-------------------+ POP      Full           Yes      Yes                                      +---------+---------------+---------+-----------+----------+-------------------+ PTV      Full                                         Not well visualized  +---------+---------------+---------+-----------+----------+-------------------+  PERO     Full                                                             +---------+---------------+---------+-----------+----------+-------------------+   +---------+---------------+---------+-----------+----------+-------------------+ LEFT     CompressibilityPhasicitySpontaneityPropertiesThrombus Aging      +---------+---------------+---------+-----------+----------+-------------------+ CFV      Full           No       Yes                  pulsatile flow      +---------+---------------+---------+-----------+----------+-------------------+ SFJ      Full                                                             +---------+---------------+---------+-----------+----------+-------------------+ FV Prox  Full           Yes      Yes                                      +---------+---------------+---------+-----------+----------+-------------------+ FV Mid   Full           Yes      Yes                                      +---------+---------------+---------+-----------+----------+-------------------+ FV DistalFull           Yes      Yes                                      +---------+---------------+---------+-----------+----------+-------------------+ PFV      Full                                                             +---------+---------------+---------+-----------+----------+-------------------+ POP      Full           Yes      Yes                                      +---------+---------------+---------+-----------+----------+-------------------+ PTV      Full                                         Not well visualized +---------+---------------+---------+-----------+----------+-------------------+ PERO     Full                                                             +---------+---------------+---------+-----------+----------+-------------------+  Summary: BILATERAL: - No evidence of deep vein thrombosis seen in the lower extremities, bilaterally. -No evidence of popliteal cyst, bilaterally.   *See table(s) above for measurements and observations. Electronically signed by Deitra Mayo MD on 09/21/2022 at 4:46:58 PM.    Final    DG Chest Port 1 View  Result Date: 09/21/2022 CLINICAL DATA:  Shortness of breath.  COVID positive. EXAM: PORTABLE CHEST 1 VIEW COMPARISON:  09/20/2022 FINDINGS: The lungs are clear without focal pneumonia, edema, pneumothorax or pleural effusion. Chronic atelectasis or scarring at the left base is stable. The cardio pericardial silhouette is enlarged. Interstitial markings are diffusely coarsened with chronic features. Bones are diffusely demineralized. Status post CABG. Telemetry leads overlie the chest. IMPRESSION: Stable chronic atelectasis or scarring at the left base. Superimposed pneumonia cannot be excluded. Electronically Signed   By: Misty Stanley M.D.   On: 09/21/2022 06:27   DG Chest 2 View  Result Date: 09/20/2022 CLINICAL DATA:  Cough EXAM: CHEST - 2 VIEW COMPARISON:  CT chest 09/02/2021, chest radiograph 08/23/2022 FINDINGS: Status post median sternotomy and CABG. Postprocedural changes from TAVR. No pleural effusion. No pneumothorax. Linear opacities at the left lung base favored to represent atelectasis. There is a more consolidative opacity seen on the lateral view along the lung bases. Normal cardiac and mediastinal contours. Unchanged calcification along the aortic arch. Surgical clips in the right quadrant. Vertebral body heights are maintained. No displaced rib fractures. Degenerative changes at the right-greater-than-left [facility] joint. IMPRESSION: Focal basilar consolidative opacity seen on the lateral view may represent atelectasis, but is suspicious for infection. Electronically Signed   By: Marin Roberts M.D.   On: 09/20/2022 09:23      Signature  -   Lala Lund M.D on 09/23/2022 at 9:52 AM   -   To page go to [email or web address]

## 2022-09-24 ENCOUNTER — Other Ambulatory Visit (HOSPITAL_COMMUNITY): Payer: Self-pay

## 2022-09-24 DIAGNOSIS — J1282 Pneumonia due to coronavirus disease 2019: Secondary | ICD-10-CM | POA: Diagnosis not present

## 2022-09-24 DIAGNOSIS — U071 COVID-19: Secondary | ICD-10-CM | POA: Diagnosis not present

## 2022-09-24 LAB — GLUCOSE, CAPILLARY: Glucose-Capillary: 135 mg/dL — ABNORMAL HIGH (ref 70–99)

## 2022-09-24 MED ORDER — METHYLPREDNISOLONE 4 MG PO TBPK
ORAL_TABLET | ORAL | 0 refills | Status: DC
  Filled 2022-09-24: qty 21, 6d supply, fill #0

## 2022-09-24 MED ORDER — BENZONATATE 100 MG PO CAPS
100.0000 mg | ORAL_CAPSULE | Freq: Three times a day (TID) | ORAL | 0 refills | Status: DC
  Filled 2022-09-24: qty 21, 7d supply, fill #0

## 2022-09-24 MED ORDER — METHYLPREDNISOLONE SODIUM SUCC 40 MG IJ SOLR: 20.0000 mg | Freq: Every day | INTRAMUSCULAR | Status: DC

## 2022-09-24 NOTE — Progress Notes (Signed)
Occupational Therapy Treatment Patient Details Name: [name] MRN: [id number] DOB: Nov 05, 1932 Today's Date: 09/24/2022   History of present illness Patient 88 y.o. who presents with worsening cough and shortness of breath over the last 2-3 days.  Patient reports that the cough is productive and he has been significantly short of breath with any kind of exertion.PMH includes HTN, HLD, PAF on Eliquis, CHF, CAD s/p CABG, DM type II, prostate cancer.   OT comments  Patient preparing to return home this date.  Patient is up and moving with generalized supervision, and is close to baseline for ADL completion from a sit to stand level.  Spouse assists as needed.  Continue OT as needed.     Recommendations for follow up therapy are one component of a multi-disciplinary discharge planning process, led by the attending physician.  Recommendations may be updated based on patient status, additional functional criteria and insurance authorization.    Follow Up Recommendations  No OT follow up     Assistance Recommended at Discharge Set up Supervision/Assistance  Patient can return home with the following  A little help with walking and/or transfers;Assist for transportation;Assistance with cooking/housework;A little help with bathing/dressing/bathroom   Equipment Recommendations  Tub/shower seat    Recommendations for Other Services      Precautions / Restrictions Precautions Precautions: Fall Restrictions Weight Bearing Restrictions: No       Mobility Bed Mobility Overal bed mobility: Modified Independent                  Transfers Overall transfer level: Needs assistance Equipment used: Rolling walker (2 wheels) Transfers: Sit to/from Stand Sit to Stand: Supervision                 Balance Overall balance assessment: Needs assistance Sitting-balance support: Feet supported Sitting balance-Leahy Scale: Good     Standing balance support: Reliant on assistive  device for balance Standing balance-Leahy Scale: Fair                             ADL either performed or assessed with clinical judgement   ADL       Grooming: Wash/dry hands;Wash/dry face;Supervision/safety;Standing                   Toilet Transfer: Supervision/safety;Rolling walker (2 wheels);Regular Toilet;Ambulation   Toileting- Clothing Manipulation and Hygiene: Supervision/safety;Sit to/from stand                                              Cognition Arousal/Alertness: Awake/alert Behavior During Therapy: WFL for tasks assessed/performed Overall Cognitive Status: Within Functional Limits for tasks assessed                                 General Comments: HOH                           Pertinent Vitals/ Pain       Pain Assessment Pain Assessment: No/denies pain  Frequency  Min 2X/week        Progress Toward Goals  OT Goals(current goals can now be found in the care plan section)  Progress towards OT goals: Progressing toward goals  Acute Rehab OT Goals OT Goal Formulation: With patient Time For Goal Achievement: 10/06/22 Potential to Achieve Goals: Good  Plan Discharge plan remains appropriate    Co-evaluation                 AM-PAC OT "6 Clicks" Daily Activity     Outcome Measure   Help from another person eating meals?: None Help from another person taking care of personal grooming?: None Help from another person toileting, which includes using toliet, bedpan, or urinal?: A Little Help from another person bathing (including washing, rinsing, drying)?: A Little Help from another person to put on and taking off regular upper body clothing?: None Help from another person to put on and taking off regular lower body clothing?: A Little 6 Click Score: 21    End of Session Equipment Utilized During  Treatment: Rolling walker (2 wheels)  OT Visit Diagnosis: Unsteadiness on feet (R26.81);Muscle weakness (generalized) (M62.81)   Activity Tolerance Patient tolerated treatment well   Patient Left with call bell/phone within reach;with family/visitor present;in bed   Nurse Communication Mobility status        Time: 1020-1041 OT Time Calculation (min): 21 min  Charges: OT General Charges $OT Visit: 1 Visit OT Treatments $Self Care/Home Management : 8-22 mins  09/24/2022  RP, OTR/L  Acute Rehabilitation Services  Office:  [phone]   [name] 09/24/2022, 10:47 AM

## 2022-09-24 NOTE — Discharge Instructions (Signed)
Follow with Primary MD [name], MD in 7 days   Get CBC, CMP, 2 view Chest X ray -  checked next visit with your primary MD   Activity: As tolerated with Full fall precautions use walker/cane & assistance as needed  Disposition Home    Diet: Heart Healthy Low Carb, 1.5 L fluid restriction per day  Special Instructions: If you have smoked or chewed Tobacco  in the last 2 yrs please stop smoking, stop any regular Alcohol  and or any Recreational drug use.  On your next visit with your primary care physician please Get Medicines reviewed and adjusted.  Please request your Prim.MD to go over all Hospital Tests and Procedure/Radiological results at the follow up, please get all Hospital records sent to your Prim MD by signing hospital release before you go home.  If you experience worsening of your admission symptoms, develop shortness of breath, life threatening emergency, suicidal or homicidal thoughts you must seek medical attention immediately by calling 911 or calling your MD immediately  if symptoms less severe.  You Must read complete instructions/literature along with all the possible adverse reactions/side effects for all the Medicines you take and that have been prescribed to you. Take any new Medicines after you have completely understood and accpet all the possible adverse reactions/side effects.

## 2022-09-24 NOTE — Discharge Summary (Signed)
                                                                                 [name] MRN:[id number] DOB: 09/24/1933 DOA: 09/20/2022  PCP: Burchette, Bruce W, MD  Admit date: 09/20/2022  Discharge date: 09/24/2022  Admitted From: Home   Disposition:  Home   Recommendations for Outpatient Follow-up:   Follow up with PCP in 1-2 weeks  PCP Please obtain BMP/CBC, 2 view CXR in 1week,  (see Discharge instructions)   PCP Please follow up on the following pending results:    Home Health: PT,OT   Equipment/Devices: as below  Consultations: None  Discharge Condition: Stable    CODE STATUS: Full    Diet Recommendation: Heart Healthy Low Carb    Chief Complaint  Patient presents with   Chest Pain   Shortness of Breath     Brief history of present illness from the day of admission and additional interim summary    88 y.o. male with medical history significant of HTN, HLD, PAF on Eliquis, CHF, CAD s/pCABG, DM type II, prostate cancer who presents with worsening cough and shortness of breath over the last 2-3 days.     Was diagnosed with COVID-19 infection/early pneumonia along with dehydration and AKI and admitted to the hospital.                                                                 Hospital Course   COVID-19 pneumonia with possible superimposed bacterial bronchitis.  He is weak and frail with underlying CKD stage IIIb and chronic systolic heart failure, agree with antiviral that was started in the ER will continue, since CRP is elevated he received IV steroids, much improved will be discharged on low-dose oral steroid taper, he also received a brief course of azithromycin for possible additional bacterial bronchitis. Clinically he is much improved relatively symptom-free on room air, continue to advance activity likely discharge home with home health on 09/24/2022.   Chest pain  elevated troponin - Chronic.  Patient reports having substernal chest pain.  High-sensitivity troponins 59-> 54.  EKG similar to prior without significant ischemic changes.  Suspect secondary to demand in the setting of acute respiratory infection.  Continue combination of Coreg, statin and Eliquis for secondary prevention.  Pain improved now.   Acute kidney injury superimposed on chronic kidney disease stage IIIb - dehydrated, Baseline creatinine previously noted to be around 1.71 last month, which improved with hydration, follow-up with PCP.   Chr. Systolic congestive heart failure - Chronic.  Appears dehydrated currently, gentle hydration, last echocardiogram noted EF to be 35 -40% with global hypokinesis of the left ventricle.  Cannot tolerate ACE/ARB due to underlying CKD stage IIIb.   Chronic atrial fibrillation on chronic anticoagulation - CHA2DS2-VASc 2 score of greater than 3.  On Coreg and Eliquis.   Hyponatremia - due to dehydration significantly improved after IV fluids.   Essential hypertension -   Held diuretics, continue Coreg and monitor.   GERD -Continue Protonix   Controlled diabetes mellitus type 2, with hyperlipidemia -continue home regimen and follow-up with PCP.  Discharge diagnosis     Principal Problem:   Pneumonia due to COVID-19 virus Active Problems:   Leukocytosis   Chest pain   Hyperkalemia   Acute kidney injury superimposed on chronic kidney disease (HCC)   Chronic systolic CHF (congestive heart failure) (HCC)   Atrial fibrillation, chronic (HCC)   Type 2 diabetes mellitus with hyperlipidemia (HCC)   Hyponatremia   GERD (gastroesophageal reflux disease)    Discharge instructions    Discharge Instructions     Discharge instructions   Complete by: As directed    Follow with Primary MD Burchette, Bruce W, MD in 7 days   Get CBC, CMP, 2 view Chest X ray -  checked next visit with your primary MD   Activity: As tolerated with Full fall precautions  use walker/cane & assistance as needed  Disposition Home    Diet: Heart Healthy Low Carb, 1.5 L fluid restriction per day  Special Instructions: If you have smoked or chewed Tobacco  in the last 2 yrs please stop smoking, stop any regular Alcohol  and or any Recreational drug use.  On your next visit with your primary care physician please Get Medicines reviewed and adjusted.  Please request your Prim.MD to go over all Hospital Tests and Procedure/Radiological results at the follow up, please get all Hospital records sent to your Prim MD by signing hospital release before you go home.  If you experience worsening of your admission symptoms, develop shortness of breath, life threatening emergency, suicidal or homicidal thoughts you must seek medical attention immediately by calling 911 or calling your MD immediately  if symptoms less severe.  You Must read complete instructions/literature along with all the possible adverse reactions/side effects for all the Medicines you take and that have been prescribed to you. Take any new Medicines after you have completely understood and accpet all the possible adverse reactions/side effects.   Increase activity slowly   Complete by: As directed    MyChart COVID-19 home monitoring program   Complete by: Sep 24, 2022    Is the patient willing to use the MyChart Mobile App for home monitoring?: Yes   Temperature monitoring   Complete by: Sep 24, 2022    After how many days would you like to receive a notification of this patient's flowsheet entries?: 1       Discharge Medications   Allergies as of 09/24/2022       Reactions   Codeine Other (See Comments)   Makes him feel goofy   Morphine Other (See Comments)   Nightmares and felt bad        Medication List     STOP taking these medications    cefdinir 300 MG capsule Commonly known as: OMNICEF   predniSONE 20 MG tablet Commonly known as: DELTASONE       TAKE these medications     acetaminophen 500 MG tablet Commonly known as: TYLENOL Take 1,000 mg by mouth every 8 (eight) hours as needed for mild pain or moderate pain.   albuterol 108 (90 Base) MCG/ACT inhaler Commonly known as: VENTOLIN HFA Inhale 2 puffs into the lungs every 6 (six) hours as needed for wheezing or shortness of breath.   amLODipine 5 MG tablet Commonly known as: NORVASC TAKE 1 TABLET BY MOUTH EVERY DAY   BD Pen Needle   Nano 2nd Gen 32G X 4 MM Misc Generic drug: Insulin Pen Needle USE AS DIRECTED.   benzonatate 100 MG capsule Commonly known as: TESSALON Take 1 capsule (100 mg total) by mouth every 8 (eight) hours.   blood glucose meter kit and supplies Kit Dispense based on patient and insurance preference. Use up to four times daily as directed. (FOR ICD-9 250.00, 250.01).   budesonide-formoterol 160-4.5 MCG/ACT inhaler Commonly known as: Symbicort Inhale 2 puffs into the lungs in the morning and at bedtime. What changed:  when to take this reasons to take this   carvedilol 6.25 MG tablet Commonly known as: COREG TAKE 1 TABLET BY MOUTH 2 TIMES DAILY WITH A MEAL.   clopidogrel 75 MG tablet Commonly known as: PLAVIX TAKE 1 TABLET BY MOUTH DAILY WITH BREAKFAST. What changed: when to take this   docusate sodium 100 MG capsule Commonly known as: Stool Softener Take 1 capsule (100 mg total) by mouth 2 (two) times daily as needed for mild constipation.   Eliquis 2.5 MG Tabs tablet Generic drug: apixaban TAKE 1 TABLET BY MOUTH TWICE A DAY What changed: how much to take   FLUoxetine 20 MG capsule Commonly known as: PROZAC Take 1 capsule (20 mg total) by mouth daily. What changed: how much to take   hydrocortisone 2.5 % cream Apply 1 application topically daily as needed (facial breakouts).   ICAPS AREDS 2 PO Take 1 capsule by mouth daily.   Januvia 50 MG tablet Generic drug: sitaGLIPtin TAKE 1 TABLET BY MOUTH EVERY DAY   LUBRICATING EYE DROPS OP Place 1 drop into  both eyes daily as needed (dry eyes).   methylPREDNISolone 4 MG Tbpk tablet Commonly known as: MEDROL DOSEPAK follow package directions   nitroGLYCERIN 0.4 MG SL tablet Commonly known as: NITROSTAT PLACE 1 TABLET UNDER THE TONGUE EVERY 5 MINUTES X 3 DOSES AS NEEDED FOR CHEST PAIN What changed: See the new instructions.   pantoprazole 40 MG tablet Commonly known as: PROTONIX TAKE 1 TABLET BY MOUTH EVERY DAY   potassium chloride 10 MEQ tablet Commonly known as: KLOR-CON TAKE 1 TABLET BY MOUTH EVERY DAY   rosuvastatin 20 MG tablet Commonly known as: CRESTOR TAKE 1 TABLET BY MOUTH EVERY DAY   spironolactone 25 MG tablet Commonly known as: ALDACTONE TAKE 1 TABLET (25 MG TOTAL) BY MOUTH DAILY. What changed: how much to take   [place] 1 application topically daily as needed (knee pain).   torsemide 20 MG tablet Commonly known as: DEMADEX TAKE 1 TABLET BY MOUTH EVERY DAY   Tresiba FlexTouch 100 UNIT/ML FlexTouch Pen Generic drug: insulin degludec INJECT 10 UNITS INTO THE SKIN DAILY What changed: when to take this   VITAMIN B-12 PO Take 1 tablet by mouth daily.               Durable Medical Equipment  (From admission, onward)           Start     Ordered   09/23/22 0953  For home use only DME Walker rolling  Once       Comments: 5 wheel  Question Answer Comment  Walker: With 5 Inch Wheels   Patient needs a walker to treat with the following condition Weakness      09/23/22 [place] Follow up.   Specialty: Home Health Services Why:  for home health services Contact information: [address] [place] [address] [phone]         [facility] Follow up.   Why: For durable medical - Rolling Walker Contact information: [address] [place] [address] [phone]         Burchette, Bruce W, MD.  Schedule an appointment as soon as possible for a visit in 1 week(s).   Specialty: Family Medicine Contact information: [address] [place] [address] [phone]                 Major procedures and Radiology Reports - PLEASE review detailed and final reports thoroughly  -      VAS US LOWER EXTREMITY VENOUS (DVT)  Result Date: 09/21/2022  Lower Venous DVT Study Patient Name:  Kristofor E Swartzlander  Date of Exam:   09/21/2022 Medical Rec #: [id number]     Accession #:    [id number] Date of Birth: 04/18/1933     Patient Gender: M Patient Age:   89 years Exam Location:  [facility] Procedure:      VAS US LOWER EXTREMITY VENOUS (DVT) Referring Phys: PRASHANT SINGH --------------------------------------------------------------------------------  Indications: "Rapidly rising D-dimer" however D-dimer actually decreasing and within normal range. (09/20/22 0.58 & 09/21/22 0.47).  Risk Factors: Covid. Limitations: Poor ultrasound/tissue interface and calcific shadowing. Comparison Study: No previous exams Performing Technologist: Jody Hill RVT, RDMS  Examination Guidelines: A complete evaluation includes B-mode imaging, spectral Doppler, color Doppler, and power Doppler as needed of all accessible portions of each vessel. Bilateral testing is considered an integral part of a complete examination. Limited examinations for reoccurring indications may be performed as noted. The reflux portion of the exam is performed with the patient in reverse Trendelenburg.  +---------+---------------+---------+-----------+----------+-------------------+ RIGHT    CompressibilityPhasicitySpontaneityPropertiesThrombus Aging      +---------+---------------+---------+-----------+----------+-------------------+ CFV      Full           No       Yes                  pulsatile flow      +---------+---------------+---------+-----------+----------+-------------------+ SFJ      Full                                                              +---------+---------------+---------+-----------+----------+-------------------+ FV Prox  Full           Yes      Yes                                      +---------+---------------+---------+-----------+----------+-------------------+ FV Mid   Full           Yes      Yes                                      +---------+---------------+---------+-----------+----------+-------------------+ FV DistalFull           Yes      Yes                                      +---------+---------------+---------+-----------+----------+-------------------+   PFV      Full                                                             +---------+---------------+---------+-----------+----------+-------------------+ POP      Full           Yes      Yes                                      +---------+---------------+---------+-----------+----------+-------------------+ PTV      Full                                         Not well visualized +---------+---------------+---------+-----------+----------+-------------------+ PERO     Full                                                             +---------+---------------+---------+-----------+----------+-------------------+   +---------+---------------+---------+-----------+----------+-------------------+ LEFT     CompressibilityPhasicitySpontaneityPropertiesThrombus Aging      +---------+---------------+---------+-----------+----------+-------------------+ CFV      Full           No       Yes                  pulsatile flow      +---------+---------------+---------+-----------+----------+-------------------+ SFJ      Full                                                             +---------+---------------+---------+-----------+----------+-------------------+ FV Prox  Full           Yes      Yes                                       +---------+---------------+---------+-----------+----------+-------------------+ FV Mid   Full           Yes      Yes                                      +---------+---------------+---------+-----------+----------+-------------------+ FV DistalFull           Yes      Yes                                      +---------+---------------+---------+-----------+----------+-------------------+ PFV      Full                                                             +---------+---------------+---------+-----------+----------+-------------------+   POP      Full           Yes      Yes                                      +---------+---------------+---------+-----------+----------+-------------------+ PTV      Full                                         Not well visualized +---------+---------------+---------+-----------+----------+-------------------+ PERO     Full                                                             +---------+---------------+---------+-----------+----------+-------------------+     Summary: BILATERAL: - No evidence of deep vein thrombosis seen in the lower extremities, bilaterally. -No evidence of popliteal cyst, bilaterally.   *See table(s) above for measurements and observations. Electronically signed by Christopher Dickson MD on 09/21/2022 at 4:46:58 PM.    Final    DG Chest Port 1 View  Result Date: 09/21/2022 CLINICAL DATA:  Shortness of breath.  COVID positive. EXAM: PORTABLE CHEST 1 VIEW COMPARISON:  09/20/2022 FINDINGS: The lungs are clear without focal pneumonia, edema, pneumothorax or pleural effusion. Chronic atelectasis or scarring at the left base is stable. The cardio pericardial silhouette is enlarged. Interstitial markings are diffusely coarsened with chronic features. Bones are diffusely demineralized. Status post CABG. Telemetry leads overlie the chest. IMPRESSION: Stable chronic atelectasis or scarring at the left base. Superimposed  pneumonia cannot be excluded. Electronically Signed   By: Eric  Mansell M.D.   On: 09/21/2022 06:27   DG Chest 2 View  Result Date: 09/20/2022 CLINICAL DATA:  Cough EXAM: CHEST - 2 VIEW COMPARISON:  CT chest 09/02/2021, chest radiograph 08/23/2022 FINDINGS: Status post median sternotomy and CABG. Postprocedural changes from TAVR. No pleural effusion. No pneumothorax. Linear opacities at the left lung base favored to represent atelectasis. There is a more consolidative opacity seen on the lateral view along the lung bases. Normal cardiac and mediastinal contours. Unchanged calcification along the aortic arch. Surgical clips in the right quadrant. Vertebral body heights are maintained. No displaced rib fractures. Degenerative changes at the right-greater-than-left AC joint. IMPRESSION: Focal basilar consolidative opacity seen on the lateral view may represent atelectasis, but is suspicious for infection. Electronically Signed   By: Hemant  Desai M.D.   On: 09/20/2022 09:23    Today   Subjective    Rajiv Rambo today has no headache,no chest abdominal pain,no new weakness tingling or numbness, feels much better wants to go home today.    Objective   Blood pressure (!) 143/65, pulse 63, temperature 98.2 F (36.8 C), temperature source Oral, resp. rate (!) 22, height 5' 9" (1.753 m), weight 72 kg, SpO2 96 %.   Intake/Output Summary (Last 24 hours) at 09/24/2022 1017 Last data filed at 09/24/2022 0600 Gross per 24 hour  Intake --  Output 800 ml  Net -800 ml    Exam  Awake Alert, No new F.N deficits,    [place].AT,PERRAL Supple Neck,   Symmetrical Chest wall movement, Good air movement bilaterally, CTAB RRR,No Gallops,   +  ve B.Sounds, Abd Soft, Non tender,  No Cyanosis, Clubbing or edema    Data Review   Recent Labs  Lab 09/20/22 0849 09/21/22 0441 09/22/22 0401 09/23/22 0758  WBC 14.0* 12.0* 12.7* 13.4*  HGB 13.5 14.2 11.5* 12.0*  HCT 41.9 43.3 34.5* 35.5*  PLT 174 127* 108* 110*   MCV 82.2 81.7 78.8* 80.7  MCH 26.5 26.8 26.3 27.3  MCHC 32.2 32.8 33.3 33.8  RDW 17.3* 17.3* 16.9* 17.1*  LYMPHSABS 0.6* 0.5* 0.4* 0.4*  MONOABS 1.1* 0.8 0.3 0.5  EOSABS 0.5 0.5 0.0 0.0  BASOSABS 0.1 0.0 0.0 0.0    Recent Labs  Lab 09/20/22 0849 09/20/22 1302 09/20/22 1851 09/21/22 0441 09/21/22 0545 09/22/22 0401 09/23/22 0758  NA  --  129*  --  131*  --  127* 133*  K  --  5.6*  --  5.1  --  4.3 4.6  CL  --  99  --  102  --  101 107  CO2  --  19*  --  17*  --  17* 19*  GLUCOSE  --  178*  --  103*  --  177* 209*  BUN  --  47*  --  47*  --  38* 26*  CREATININE  --  2.42*  --  2.35*  --  1.81* 1.54*  AST  --   --   --  18  --  15 17  ALT  --   --   --  27  --  23 24  ALKPHOS  --   --   --  116  --  104 102  BILITOT  --   --   --  0.8  --  0.6 0.6  ALBUMIN  --   --   --  2.7*  --  2.4* 2.5*  CRP  --   --  13.5* 12.5*  --  9.4* 3.1*  DDIMER  --   --  0.58*  --  0.47 0.53* 0.47  PROCALCITON  --   --  0.13  --   --   --   --   BNP 612.5*  --   --  592.2*  --   --   --   MG  --   --   --  2.0  --  1.9 2.0  CALCIUM  --  9.5  --  9.7  --  9.1 9.9     Total Time in preparing paper work, data evaluation and todays exam - 35 minutes  Signature  -    Prashant Singh M.D on 09/24/2022 at 10:17 AM   -  To page go to [email or web address]      

## 2022-09-27 ENCOUNTER — Ambulatory Visit: Payer: Medicare PPO | Admitting: Cardiology

## 2022-09-29 ENCOUNTER — Telehealth: Payer: Self-pay | Admitting: Family Medicine

## 2022-09-29 NOTE — Telephone Encounter (Signed)
SN eval order for condition and medication management. Pt also has 2 open wounds that need to be checked (buttocks and back). Also needs speech language pathology eval patient is at high risk of aspiration  pneumonia. And additional physical therapy visit

## 2022-09-30 ENCOUNTER — Other Ambulatory Visit: Payer: Self-pay | Admitting: Family Medicine

## 2022-09-30 ENCOUNTER — Other Ambulatory Visit: Payer: Self-pay | Admitting: Physician Assistant

## 2022-09-30 DIAGNOSIS — E1121 Type 2 diabetes mellitus with diabetic nephropathy: Secondary | ICD-10-CM

## 2022-09-30 DIAGNOSIS — K2211 Ulcer of esophagus with bleeding: Secondary | ICD-10-CM

## 2022-09-30 DIAGNOSIS — E785 Hyperlipidemia, unspecified: Secondary | ICD-10-CM

## 2022-09-30 DIAGNOSIS — J4540 Moderate persistent asthma, uncomplicated: Secondary | ICD-10-CM

## 2022-09-30 NOTE — Telephone Encounter (Signed)
Left a detailed message on [place] of message below

## 2022-10-01 ENCOUNTER — Ambulatory Visit: Payer: Medicare PPO | Admitting: Family Medicine

## 2022-10-01 ENCOUNTER — Encounter: Payer: Self-pay | Admitting: Family Medicine

## 2022-10-01 ENCOUNTER — Telehealth: Payer: Self-pay | Admitting: Family Medicine

## 2022-10-01 ENCOUNTER — Ambulatory Visit (INDEPENDENT_AMBULATORY_CARE_PROVIDER_SITE_OTHER): Payer: Medicare PPO

## 2022-10-01 VITALS — BP 110/60 | HR 81 | Temp 97.9°F | Ht 69.0 in | Wt 147.6 lb

## 2022-10-01 DIAGNOSIS — U071 COVID-19: Secondary | ICD-10-CM | POA: Diagnosis not present

## 2022-10-01 DIAGNOSIS — L89312 Pressure ulcer of right buttock, stage 2: Secondary | ICD-10-CM | POA: Diagnosis not present

## 2022-10-01 DIAGNOSIS — E1121 Type 2 diabetes mellitus with diabetic nephropathy: Secondary | ICD-10-CM

## 2022-10-01 DIAGNOSIS — B370 Candidal stomatitis: Secondary | ICD-10-CM | POA: Diagnosis not present

## 2022-10-01 DIAGNOSIS — R1310 Dysphagia, unspecified: Secondary | ICD-10-CM

## 2022-10-01 DIAGNOSIS — J189 Pneumonia, unspecified organism: Secondary | ICD-10-CM | POA: Diagnosis not present

## 2022-10-01 DIAGNOSIS — J1282 Pneumonia due to coronavirus disease 2019: Secondary | ICD-10-CM

## 2022-10-01 MED ORDER — FLUCONAZOLE 200 MG PO TABS: 200.0000 mg | ORAL_TABLET | Freq: Every day | ORAL | 0 refills | Status: DC

## 2022-10-01 NOTE — Telephone Encounter (Addendum)
Claiborne Billings with [facility] [phone] [facility] to leave a detailed message on this line)  PT Verbal Order for today  The PT Case manager wants to go see Pt today to FU -  *Pt is scheduled to be seen today at 3:15 pm, for a HFU.   Please advise.

## 2022-10-01 NOTE — Progress Notes (Unsigned)
Established Patient Office Visit  Subjective   Patient ID: [name], male    DOB: 22-Sep-1933  Age: 88 y.o. MRN: [id number]  Chief Complaint  Patient presents with   Hospitalization Follow-up    HPI  {History (Optional):23778} [name] is seen accompanied by his wife for hospital follow-up.  He was admitted on the fourth and discharged on the eighth.  He has history of multiple chronic translating hypertension, hyperlipidemia, A-fib, systolic heart failure, CAD, type 2 diabetes, prostate cancer.  He presented to the ED on the day of admission with increasing shortness of breath and worsening cough for 2 or 3 days.  Was diagnosed with COVID-19 infection with early pneumonia and dehydration.  He was very weak and frail and was started on antiviral and also given some IV steroids and fluids.  Also discharged on oral prednisone taper.  Expectantly, his blood sugars have been running somewhat higher since discharge.  Blood sugar this morning was slightly improved at 135.  He was also treated with Zithromax for possible superimposed bacterial bronchitis.  He had elevated troponin which is chronic.  He did have some substernal chest pain but this was mostly with swallowing.  Swallowing difficulties have worsened since discharge.  His heart failure was stable.  Last ejection fraction 35 to 40% with global hypokinesis.  He remains o no bulimic and n Coreg and Eliquis for his A-fib.  Hyponatremia appear to be hypoeuvolemic and improved with hydration  Their major concern right now is odontophagia.  Worsened since discharge.  Takes Protonix regularly and has not noticed any active reflux symptoms recently.  Does have poor appetite but is had poor appetite for some time.  Other issues he has sore on his buttocks.  We had a call from home health PT couple days ago requesting verbal order for home health nursing which we gave.  They have not been out yet.  Diabetes been fairly well-controlled.  Last A1c  was 6.9%.  Is on Tresiba 10 units once daily and also takes low-dose Januvia.  Past Medical History:  Diagnosis Date   Aortic stenosis, moderate 09/20/2020   Arthritis    CAD (coronary artery disease)    a. Cath September 2015 LIMA to the LAD patent, SVG to PDA patent, SVG to posterior lateral patent, SVG to OM with a 90% in-stent restenosis at an anastomotic lesion. This was treated with angioplasty. b. cath 04/01/2015 95% ISR in SVG to OM treated with 2.75x24 Synergy DES postdilated to 3.92m, all other grafts patent   Cataract    surgery,B/L   CHF (congestive heart failure) (HCC)    Chronic kidney disease    nephrolithiasis   Diabetes mellitus    TYPE 2   GERD (gastroesophageal reflux disease)    Hernia    Hypercholesterolemia    Hypertension    Incontinence    hx  over 1 year,leaks without awareness   Other and unspecified diseases of appendix    [place]    in the setting of ischemia on 03/31/2015   Pancreatitis    [place] 03/01[place]   prostate bx=Adenocarcinoam,gleason 3+4=7,PSA=6.75   Shortness of breath dyspnea    Ulcer    hx gastric   Past Surgical History:  Procedure Laterality Date   APPENDECTOMY     BACK SURGERY     CARDIAC CATHETERIZATION N/A 04/01/2015   Procedure: Left Heart Cath and Cors/Grafts Angiography;  Surgeon: JJettie Booze MD;  [facility];  Service: Cardiovascular;  Laterality: N/A;   CARDIAC CATHETERIZATION N/A 04/01/2015   Procedure: Coronary Stent Intervention;  Surgeon: Jettie Booze, MD;  Location: [place] [facility];  Service: Cardiovascular;  Laterality: N/A;   CARDIOVERSION N/A 11/01/2019   Procedure: CARDIOVERSION;  Surgeon: Geralynn Rile, MD;  Location: [facility];  Service: Cardiovascular;  Laterality: N/A;   CARPAL TUNNEL RELEASE  2004   both hands   CORONARY ARTERY BYPASS GRAFT     CORONARY BALLOON ANGIOPLASTY N/A 08/19/2021   Procedure: CORONARY BALLOON  ANGIOPLASTY;  Surgeon: Burnell Blanks, MD;  Location: [place] [facility];  Service: Cardiovascular;  Laterality: N/A;   CORONARY STENT INTERVENTION N/A 09/23/2020   Procedure: CORONARY STENT INTERVENTION;  Surgeon: Sherren Mocha, MD;  Location: [place] [facility];  Service: Cardiovascular;  Laterality: N/A;   CYSTOSCOPY  08/23/12   incomplete emoptying bladder   HERNIA REPAIR     incision and drainage of right chest abscess     INTRAOPERATIVE TRANSTHORACIC ECHOCARDIOGRAM Left 04/28/2021   Procedure: INTRAOPERATIVE TRANSTHORACIC ECHOCARDIOGRAM;  Surgeon: Burnell Blanks, MD;  Location: [facility];  Service: Open Heart Surgery;  Laterality: Left;   LAPAROSCOPIC CHOLECYSTECTOMY  09/2009   LEFT HEART CATH AND CORS/GRAFTS ANGIOGRAPHY N/A 08/19/2021   Procedure: LEFT HEART CATH AND CORS/GRAFTS ANGIOGRAPHY;  Surgeon: Burnell Blanks, MD;  Location: [place] [facility];  Service: Cardiovascular;  Laterality: N/A;   LEFT HEART CATHETERIZATION WITH CORONARY ANGIOGRAM N/A 07/19/2014   Procedure: LEFT HEART CATHETERIZATION WITH CORONARY ANGIOGRAM;  Surgeon: Leonie Man, MD;  Location: [facility];  Service: Cardiovascular;  Laterality: N/A;   RECTAL SURGERY     RIGHT/LEFT HEART CATH AND CORONARY/GRAFT ANGIOGRAPHY N/A 09/23/2020   Procedure: RIGHT/LEFT HEART CATH AND CORONARY/GRAFT ANGIOGRAPHY;  Surgeon: Sherren Mocha, MD;  Location: [place] [facility];  Service: Cardiovascular;  Laterality: N/A;   ROBOT ASSISTED LAPAROSCOPIC RADICAL PROSTATECTOMY  2005   TRANSCATHETER AORTIC VALVE REPLACEMENT, TRANSFEMORAL Bilateral 04/28/2021   Procedure: TRANSCATHETER AORTIC VALVE REPLACEMENT, TRANSFEMORAL;  Surgeon: Burnell Blanks, MD;  Location: [facility];  Service: Open Heart Surgery;  Laterality: Bilateral;   ULTRASOUND GUIDANCE FOR VASCULAR ACCESS Bilateral 04/28/2021   Procedure: ULTRASOUND GUIDANCE FOR VASCULAR ACCESS;  Surgeon: Burnell Blanks, MD;  Location: [place];  Service: Open  Heart Surgery;  Laterality: Bilateral;    reports that he quit smoking about 50 years ago. His smoking use included cigarettes. He has a 2.50 pack-year smoking history. He has never used smokeless tobacco. He reports that he does not drink alcohol and does not use drugs. family history includes Cancer in his mother; Heart attack in his father. Allergies  Allergen Reactions   Codeine Other (See Comments)    Makes him feel goofy   Morphine Other (See Comments)    Nightmares and felt bad    Review of Systems  Constitutional:  Positive for malaise/fatigue. Negative for chills and fever.  HENT:  Negative for sore throat.   Respiratory:  Positive for shortness of breath. Negative for hemoptysis and wheezing.   Cardiovascular:  Negative for chest pain.  Gastrointestinal:  Negative for heartburn, nausea and vomiting.  Genitourinary:  Negative for dysuria.      Objective:     BP 110/60 (BP Location: Left Arm, Patient Position: Sitting, Cuff Size: Normal)   Pulse 81   Temp 97.9 F (36.6 C) (Oral)   Ht '5\' 9"'$  (1.753 m)   Wt 147 lb 9.6 oz (67 kg)  SpO2 98%   BMI 21.80 kg/m  {Vitals History (Optional):23777}  Physical Exam Vitals reviewed.  Constitutional:      Comments: Somewhat frail-appearing 88 year old male  HENT:     Mouth/Throat:     Comments: Oropharynx reveals several whitish patches scattered along his soft and hard palate and posterior pharynx Cardiovascular:     Rate and Rhythm: Normal rate.  Pulmonary:     Effort: Pulmonary effort is normal.     Comments: Does have a few scattered faint wheezes and some faint rales in both bases. Musculoskeletal:     Right lower leg: No edema.     Left lower leg: No edema.  Skin:    Comments: Very small eschar left buttock.  Right buttock reveals proxy 1 x 1 cm grade 2 decubitus ulcer.  No signs of surrounding infection.  Neurological:     Mental Status: He is alert.      No results found for any visits on 10/01/22.  {Labs  (Optional):23779}  The ASCVD Risk score (Arnett DK, et al., 2019) failed to calculate for the following reasons:   The 2019 ASCVD risk score is only valid for ages 70 to 84   The patient has a prior MI or stroke diagnosis    Assessment & Plan:   #1 recent COVID-19 pneumonia.  Stable respiratory wise at this time with O2 sats 98% room air.  Repeat chest x-ray along with CBC and CMP  #2 odynophagia.  He does have evidence on exam today of thrush which may be related to Symbicort.  We recommend starting fluconazole 200 mg daily for 14 days.  Reassess in a couple weeks.  Consider GI referral if not improving significantly with the above.  He knows that this can be very serious and progressive and if not improving soon over the next few days may need to consider repeat hospitalization -Reminder to rinse mouth consistently after Symbicort use.  #3 right buttock decubitus ulcer.  Superficial stage II.  Referral has been placed and order given for home health nursing.  We recommend they apply some Vaseline for now to reduce friction and try to keep pressure off is much as possible  #4 type 2 diabetes.  Last A1c 6.9%.  Exacerbated by recent steroids.  Bump Tresiba up to 12 units daily and continue to monitor closely.  Should come back down after he comes off the steroid.   Return in about 2 weeks (around 10/15/2022).    Carolann Littler, MD

## 2022-10-01 NOTE — Telephone Encounter (Signed)
Claiborne Billings informed of verbal orders

## 2022-10-02 LAB — COMPREHENSIVE METABOLIC PANEL
AG Ratio: 1.2 (calc) (ref 1.0–2.5)
ALT: 33 U/L (ref 9–46)
AST: 15 U/L (ref 10–35)
Albumin: 3.8 g/dL (ref 3.6–5.1)
Alkaline phosphatase (APISO): 160 U/L — ABNORMAL HIGH (ref 35–144)
BUN/Creatinine Ratio: 22 (calc) (ref 6–22)
BUN: 42 mg/dL — ABNORMAL HIGH (ref 7–25)
CO2: 25 mmol/L (ref 20–32)
Calcium: 9.7 mg/dL (ref 8.6–10.3)
Chloride: 92 mmol/L — ABNORMAL LOW (ref 98–110)
Creat: 1.87 mg/dL — ABNORMAL HIGH (ref 0.70–1.22)
Globulin: 3.3 g/dL (calc) (ref 1.9–3.7)
Glucose, Bld: 355 mg/dL — ABNORMAL HIGH (ref 65–99)
Potassium: 4.9 mmol/L (ref 3.5–5.3)
Sodium: 129 mmol/L — ABNORMAL LOW (ref 135–146)
Total Bilirubin: 0.9 mg/dL (ref 0.2–1.2)
Total Protein: 7.1 g/dL (ref 6.1–8.1)

## 2022-10-02 LAB — CBC WITH DIFFERENTIAL/PLATELET
Absolute Monocytes: 1183 cells/uL — ABNORMAL HIGH (ref 200–950)
Basophils Absolute: 54 cells/uL (ref 0–200)
Basophils Relative: 0.4 %
Eosinophils Absolute: 41 cells/uL (ref 15–500)
Eosinophils Relative: 0.3 %
HCT: 42.5 % (ref 38.5–50.0)
Hemoglobin: 13.6 g/dL (ref 13.2–17.1)
Lymphs Abs: 571 cells/uL — ABNORMAL LOW (ref 850–3900)
MCH: 26.8 pg — ABNORMAL LOW (ref 27.0–33.0)
MCHC: 32 g/dL (ref 32.0–36.0)
MCV: 83.7 fL (ref 80.0–100.0)
MPV: 10.6 fL (ref 7.5–12.5)
Monocytes Relative: 8.7 %
Neutro Abs: 11750 cells/uL — ABNORMAL HIGH (ref 1500–7800)
Neutrophils Relative %: 86.4 %
Platelets: 158 10*3/uL (ref 140–400)
RBC: 5.08 10*6/uL (ref 4.20–5.80)
RDW: 14.7 % (ref 11.0–15.0)
Total Lymphocyte: 4.2 %
WBC: 13.6 10*3/uL — ABNORMAL HIGH (ref 3.8–10.8)

## 2022-10-07 ENCOUNTER — Other Ambulatory Visit: Payer: Self-pay | Admitting: Internal Medicine

## 2022-10-07 ENCOUNTER — Telehealth: Payer: Self-pay | Admitting: Family Medicine

## 2022-10-07 NOTE — Telephone Encounter (Signed)
Pt wife is calling and pt still has a cough and she would like to know if he can get another round of abx. Pt was seen on 10-01-2022 and does have follow up appt on 10-15-2022  [id number] Phone: [phone] Fax: [phone]

## 2022-10-07 NOTE — Telephone Encounter (Signed)
Spoke with the patient's wife and she stated she has not taken the patient's temperature.  Mrs Canizares stated the patient has had a productive cough with yellow sputum x2 weeks and feels the chronic shortness of breath he has daily is better at this point.  Message sent to PCP.

## 2022-10-08 MED ORDER — CEFDINIR 300 MG PO CAPS: 300.0000 mg | ORAL_CAPSULE | Freq: Two times a day (BID) | ORAL | 0 refills | Status: DC

## 2022-10-08 NOTE — Telephone Encounter (Signed)
Spoke with [name] and informed her of the message below.

## 2022-10-08 NOTE — Telephone Encounter (Signed)
Spoke with the patient's wife and informed her the Rx was sent in as below.  Message sent to PCP as she wanted to know if the medication given for thrush could be causing the cough and lack of appetite?

## 2022-10-12 ENCOUNTER — Other Ambulatory Visit: Payer: Self-pay | Admitting: Family Medicine

## 2022-10-13 ENCOUNTER — Observation Stay (HOSPITAL_COMMUNITY): Payer: Medicare PPO

## 2022-10-13 ENCOUNTER — Emergency Department (HOSPITAL_COMMUNITY): Payer: Medicare PPO

## 2022-10-13 ENCOUNTER — Encounter (HOSPITAL_COMMUNITY): Payer: Self-pay | Admitting: Internal Medicine

## 2022-10-13 ENCOUNTER — Inpatient Hospital Stay (HOSPITAL_COMMUNITY)
Admission: EM | Admit: 2022-10-13 | Discharge: 2022-10-25 | DRG: 193 | Disposition: A | Discharge status: Hospice | Payer: Medicare PPO | Attending: Internal Medicine | Admitting: Internal Medicine

## 2022-10-13 ENCOUNTER — Telehealth: Payer: Self-pay | Admitting: Family Medicine

## 2022-10-13 ENCOUNTER — Other Ambulatory Visit: Payer: Self-pay

## 2022-10-13 DIAGNOSIS — I130 Hypertensive heart and chronic kidney disease with heart failure and stage 1 through stage 4 chronic kidney disease, or unspecified chronic kidney disease: Secondary | ICD-10-CM | POA: Diagnosis present

## 2022-10-13 DIAGNOSIS — Z8249 Family history of ischemic heart disease and other diseases of the circulatory system: Secondary | ICD-10-CM

## 2022-10-13 DIAGNOSIS — D696 Thrombocytopenia, unspecified: Secondary | ICD-10-CM | POA: Diagnosis not present

## 2022-10-13 DIAGNOSIS — Z515 Encounter for palliative care: Secondary | ICD-10-CM | POA: Diagnosis not present

## 2022-10-13 DIAGNOSIS — R627 Adult failure to thrive: Secondary | ICD-10-CM

## 2022-10-13 DIAGNOSIS — R079 Chest pain, unspecified: Secondary | ICD-10-CM

## 2022-10-13 DIAGNOSIS — Z951 Presence of aortocoronary bypass graft: Secondary | ICD-10-CM

## 2022-10-13 DIAGNOSIS — N1832 Chronic kidney disease, stage 3b: Secondary | ICD-10-CM | POA: Diagnosis present

## 2022-10-13 DIAGNOSIS — I429 Cardiomyopathy, unspecified: Secondary | ICD-10-CM | POA: Diagnosis present

## 2022-10-13 DIAGNOSIS — I472 Ventricular tachycardia, unspecified: Secondary | ICD-10-CM | POA: Diagnosis not present

## 2022-10-13 DIAGNOSIS — E1122 Type 2 diabetes mellitus with diabetic chronic kidney disease: Secondary | ICD-10-CM | POA: Diagnosis present

## 2022-10-13 DIAGNOSIS — I4819 Other persistent atrial fibrillation: Secondary | ICD-10-CM | POA: Diagnosis present

## 2022-10-13 DIAGNOSIS — L89322 Pressure ulcer of left buttock, stage 2: Secondary | ICD-10-CM | POA: Diagnosis present

## 2022-10-13 DIAGNOSIS — I25118 Atherosclerotic heart disease of native coronary artery with other forms of angina pectoris: Secondary | ICD-10-CM | POA: Diagnosis not present

## 2022-10-13 DIAGNOSIS — R310 Gross hematuria: Secondary | ICD-10-CM | POA: Insufficient documentation

## 2022-10-13 DIAGNOSIS — R55 Syncope and collapse: Secondary | ICD-10-CM | POA: Diagnosis not present

## 2022-10-13 DIAGNOSIS — J101 Influenza due to other identified influenza virus with other respiratory manifestations: Secondary | ICD-10-CM | POA: Diagnosis present

## 2022-10-13 DIAGNOSIS — Z7901 Long term (current) use of anticoagulants: Secondary | ICD-10-CM

## 2022-10-13 DIAGNOSIS — I5023 Acute on chronic systolic (congestive) heart failure: Secondary | ICD-10-CM | POA: Diagnosis present

## 2022-10-13 DIAGNOSIS — E1169 Type 2 diabetes mellitus with other specified complication: Secondary | ICD-10-CM | POA: Diagnosis present

## 2022-10-13 DIAGNOSIS — I4729 Other ventricular tachycardia: Secondary | ICD-10-CM | POA: Insufficient documentation

## 2022-10-13 DIAGNOSIS — I10 Essential (primary) hypertension: Secondary | ICD-10-CM | POA: Diagnosis present

## 2022-10-13 DIAGNOSIS — E875 Hyperkalemia: Secondary | ICD-10-CM | POA: Diagnosis present

## 2022-10-13 DIAGNOSIS — E7800 Pure hypercholesterolemia, unspecified: Secondary | ICD-10-CM | POA: Diagnosis present

## 2022-10-13 DIAGNOSIS — D6959 Other secondary thrombocytopenia: Secondary | ICD-10-CM | POA: Diagnosis present

## 2022-10-13 DIAGNOSIS — R001 Bradycardia, unspecified: Secondary | ICD-10-CM | POA: Diagnosis present

## 2022-10-13 DIAGNOSIS — J1282 Pneumonia due to coronavirus disease 2019: Secondary | ICD-10-CM | POA: Diagnosis not present

## 2022-10-13 DIAGNOSIS — J189 Pneumonia, unspecified organism: Principal | ICD-10-CM

## 2022-10-13 DIAGNOSIS — R918 Other nonspecific abnormal finding of lung field: Secondary | ICD-10-CM | POA: Diagnosis not present

## 2022-10-13 DIAGNOSIS — U071 COVID-19: Secondary | ICD-10-CM | POA: Diagnosis not present

## 2022-10-13 DIAGNOSIS — I350 Nonrheumatic aortic (valve) stenosis: Secondary | ICD-10-CM | POA: Diagnosis present

## 2022-10-13 DIAGNOSIS — Z952 Presence of prosthetic heart valve: Secondary | ICD-10-CM | POA: Diagnosis not present

## 2022-10-13 DIAGNOSIS — J90 Pleural effusion, not elsewhere classified: Secondary | ICD-10-CM | POA: Diagnosis not present

## 2022-10-13 DIAGNOSIS — I447 Left bundle-branch block, unspecified: Secondary | ICD-10-CM | POA: Diagnosis present

## 2022-10-13 DIAGNOSIS — I272 Pulmonary hypertension, unspecified: Secondary | ICD-10-CM | POA: Diagnosis present

## 2022-10-13 DIAGNOSIS — G8929 Other chronic pain: Secondary | ICD-10-CM | POA: Diagnosis present

## 2022-10-13 DIAGNOSIS — Z79899 Other long term (current) drug therapy: Secondary | ICD-10-CM | POA: Diagnosis not present

## 2022-10-13 DIAGNOSIS — E871 Hypo-osmolality and hyponatremia: Secondary | ICD-10-CM | POA: Diagnosis present

## 2022-10-13 DIAGNOSIS — R5381 Other malaise: Secondary | ICD-10-CM | POA: Diagnosis present

## 2022-10-13 DIAGNOSIS — L8990 Pressure ulcer of unspecified site, unspecified stage: Secondary | ICD-10-CM | POA: Insufficient documentation

## 2022-10-13 DIAGNOSIS — D649 Anemia, unspecified: Secondary | ICD-10-CM | POA: Diagnosis present

## 2022-10-13 DIAGNOSIS — Z8507 Personal history of malignant neoplasm of pancreas: Secondary | ICD-10-CM

## 2022-10-13 DIAGNOSIS — J1008 Influenza due to other identified influenza virus with other specified pneumonia: Secondary | ICD-10-CM | POA: Diagnosis present

## 2022-10-13 DIAGNOSIS — Z8616 Personal history of COVID-19: Secondary | ICD-10-CM

## 2022-10-13 DIAGNOSIS — J440 Chronic obstructive pulmonary disease with acute lower respiratory infection: Secondary | ICD-10-CM | POA: Diagnosis present

## 2022-10-13 DIAGNOSIS — Z800 Family history of malignant neoplasm of digestive organs: Secondary | ICD-10-CM

## 2022-10-13 DIAGNOSIS — J811 Chronic pulmonary edema: Secondary | ICD-10-CM | POA: Diagnosis not present

## 2022-10-13 DIAGNOSIS — J984 Other disorders of lung: Secondary | ICD-10-CM | POA: Diagnosis not present

## 2022-10-13 DIAGNOSIS — I5021 Acute systolic (congestive) heart failure: Secondary | ICD-10-CM | POA: Diagnosis not present

## 2022-10-13 DIAGNOSIS — Z7401 Bed confinement status: Secondary | ICD-10-CM | POA: Diagnosis not present

## 2022-10-13 DIAGNOSIS — Z955 Presence of coronary angioplasty implant and graft: Secondary | ICD-10-CM

## 2022-10-13 DIAGNOSIS — Z66 Do not resuscitate: Secondary | ICD-10-CM | POA: Diagnosis present

## 2022-10-13 DIAGNOSIS — L89312 Pressure ulcer of right buttock, stage 2: Secondary | ICD-10-CM | POA: Diagnosis present

## 2022-10-13 DIAGNOSIS — F05 Delirium due to known physiological condition: Secondary | ICD-10-CM | POA: Diagnosis not present

## 2022-10-13 DIAGNOSIS — I493 Ventricular premature depolarization: Secondary | ICD-10-CM | POA: Diagnosis present

## 2022-10-13 DIAGNOSIS — R42 Dizziness and giddiness: Secondary | ICD-10-CM | POA: Diagnosis not present

## 2022-10-13 DIAGNOSIS — I5022 Chronic systolic (congestive) heart failure: Secondary | ICD-10-CM | POA: Diagnosis present

## 2022-10-13 DIAGNOSIS — I700 Atherosclerosis of aorta: Secondary | ICD-10-CM | POA: Diagnosis not present

## 2022-10-13 DIAGNOSIS — Z7189 Other specified counseling: Secondary | ICD-10-CM | POA: Diagnosis not present

## 2022-10-13 DIAGNOSIS — Z9049 Acquired absence of other specified parts of digestive tract: Secondary | ICD-10-CM

## 2022-10-13 DIAGNOSIS — Z8546 Personal history of malignant neoplasm of prostate: Secondary | ICD-10-CM

## 2022-10-13 DIAGNOSIS — E861 Hypovolemia: Secondary | ICD-10-CM | POA: Diagnosis present

## 2022-10-13 DIAGNOSIS — R531 Weakness: Secondary | ICD-10-CM

## 2022-10-13 DIAGNOSIS — J9811 Atelectasis: Secondary | ICD-10-CM | POA: Diagnosis not present

## 2022-10-13 DIAGNOSIS — K219 Gastro-esophageal reflux disease without esophagitis: Secondary | ICD-10-CM | POA: Diagnosis present

## 2022-10-13 DIAGNOSIS — I2510 Atherosclerotic heart disease of native coronary artery without angina pectoris: Secondary | ICD-10-CM | POA: Diagnosis present

## 2022-10-13 DIAGNOSIS — Z7951 Long term (current) use of inhaled steroids: Secondary | ICD-10-CM

## 2022-10-13 DIAGNOSIS — Z452 Encounter for adjustment and management of vascular access device: Secondary | ICD-10-CM | POA: Diagnosis not present

## 2022-10-13 DIAGNOSIS — R059 Cough, unspecified: Secondary | ICD-10-CM | POA: Diagnosis not present

## 2022-10-13 DIAGNOSIS — J439 Emphysema, unspecified: Secondary | ICD-10-CM | POA: Diagnosis not present

## 2022-10-13 DIAGNOSIS — Z9079 Acquired absence of other genital organ(s): Secondary | ICD-10-CM

## 2022-10-13 DIAGNOSIS — Z87891 Personal history of nicotine dependence: Secondary | ICD-10-CM

## 2022-10-13 DIAGNOSIS — I517 Cardiomegaly: Secondary | ICD-10-CM | POA: Diagnosis not present

## 2022-10-13 DIAGNOSIS — Z7902 Long term (current) use of antithrombotics/antiplatelets: Secondary | ICD-10-CM

## 2022-10-13 LAB — RESP PANEL BY RT-PCR (RSV, FLU A&B, COVID)  RVPGX2
Influenza A by PCR: POSITIVE — AB
Influenza B by PCR: NEGATIVE
Resp Syncytial Virus by PCR: NEGATIVE
SARS Coronavirus 2 by RT PCR: POSITIVE — AB

## 2022-10-13 LAB — CBC WITH DIFFERENTIAL/PLATELET
Abs Immature Granulocytes: 0.14 10*3/uL — ABNORMAL HIGH (ref 0.00–0.07)
Basophils Absolute: 0 10*3/uL (ref 0.0–0.1)
Basophils Relative: 0 %
Eosinophils Absolute: 0.1 10*3/uL (ref 0.0–0.5)
Eosinophils Relative: 1 %
HCT: 35.4 % — ABNORMAL LOW (ref 39.0–52.0)
Hemoglobin: 12.1 g/dL — ABNORMAL LOW (ref 13.0–17.0)
Immature Granulocytes: 2 %
Lymphocytes Relative: 7 %
Lymphs Abs: 0.6 10*3/uL — ABNORMAL LOW (ref 0.7–4.0)
MCH: 27.6 pg (ref 26.0–34.0)
MCHC: 34.2 g/dL (ref 30.0–36.0)
MCV: 80.6 fL (ref 80.0–100.0)
Monocytes Absolute: 0.5 10*3/uL (ref 0.1–1.0)
Monocytes Relative: 6 %
Neutro Abs: 7.7 10*3/uL (ref 1.7–7.7)
Neutrophils Relative %: 84 %
Platelets: 79 10*3/uL — ABNORMAL LOW (ref 150–400)
RBC: 4.39 MIL/uL (ref 4.22–5.81)
RDW: 18.1 % — ABNORMAL HIGH (ref 11.5–15.5)
WBC: 9.1 10*3/uL (ref 4.0–10.5)
nRBC: 0 % (ref 0.0–0.2)

## 2022-10-13 LAB — T4, FREE: Free T4: 1.16 ng/dL — ABNORMAL HIGH (ref 0.61–1.12)

## 2022-10-13 LAB — BASIC METABOLIC PANEL
Anion gap: 10 (ref 5–15)
BUN: 50 mg/dL — ABNORMAL HIGH (ref 8–23)
CO2: 21 mmol/L — ABNORMAL LOW (ref 22–32)
Calcium: 8.9 mg/dL (ref 8.9–10.3)
Chloride: 96 mmol/L — ABNORMAL LOW (ref 98–111)
Creatinine, Ser: 2.39 mg/dL — ABNORMAL HIGH (ref 0.61–1.24)
GFR, Estimated: 26 mL/min — ABNORMAL LOW (ref >60)
Glucose, Bld: 118 mg/dL — ABNORMAL HIGH (ref 70–99)
Potassium: 5.3 mmol/L — ABNORMAL HIGH (ref 3.5–5.1)
Sodium: 127 mmol/L — ABNORMAL LOW (ref 135–145)

## 2022-10-13 LAB — ECHOCARDIOGRAM COMPLETE
Area-P 1/2: 4.15 cm2
Calc EF: 35.2 %
S' Lateral: 3.3 cm
Single Plane A2C EF: 39.1 %
Single Plane A4C EF: 29.9 %

## 2022-10-13 LAB — TSH: TSH: 5.697 u[IU]/mL — ABNORMAL HIGH (ref 0.350–4.500)

## 2022-10-13 LAB — CBG MONITORING, ED: Glucose-Capillary: 106 mg/dL — ABNORMAL HIGH (ref 70–99)

## 2022-10-13 LAB — MAGNESIUM: Magnesium: 2.1 mg/dL (ref 1.7–2.4)

## 2022-10-13 LAB — BRAIN NATRIURETIC PEPTIDE: B Natriuretic Peptide: 1046.9 pg/mL — ABNORMAL HIGH (ref 0.0–100.0)

## 2022-10-13 LAB — LACTIC ACID, PLASMA: Lactic Acid, Venous: 1.2 mmol/L (ref 0.5–1.9)

## 2022-10-13 LAB — TROPONIN I (HIGH SENSITIVITY)
Troponin I (High Sensitivity): 43 ng/L — ABNORMAL HIGH (ref <18)
Troponin I (High Sensitivity): 45 ng/L — ABNORMAL HIGH (ref <18)
Troponin I (High Sensitivity): 53 ng/L — ABNORMAL HIGH (ref <18)

## 2022-10-13 LAB — GLUCOSE, CAPILLARY: Glucose-Capillary: 87 mg/dL (ref 70–99)

## 2022-10-13 LAB — PROCALCITONIN: Procalcitonin: <0.1 ng/mL

## 2022-10-13 MED ORDER — NICOTINE 14 MG/24HR TD PT24
14.0000 mg | MEDICATED_PATCH | Freq: Every day | TRANSDERMAL | Status: DC
  Administered 2022-10-13 – 2022-10-16 (×4): 14 mg via TRANSDERMAL
  Filled 2022-10-13 (×8): qty 1

## 2022-10-13 MED ORDER — SODIUM CHLORIDE 0.9 % IV SOLN: 1.0000 g | Freq: Once | INTRAVENOUS | Status: DC

## 2022-10-13 MED ORDER — ACETAMINOPHEN 650 MG RE SUPP: 650.0000 mg | Freq: Four times a day (QID) | RECTAL | Status: DC | PRN

## 2022-10-13 MED ORDER — BISACODYL 5 MG PO TBEC: 5.0000 mg | DELAYED_RELEASE_TABLET | Freq: Every day | ORAL | Status: DC | PRN

## 2022-10-13 MED ORDER — MOMETASONE FURO-FORMOTEROL FUM 200-5 MCG/ACT IN AERO
2.0000 | INHALATION_SPRAY | Freq: Two times a day (BID) | RESPIRATORY_TRACT | Status: DC
  Administered 2022-10-13 – 2022-10-25 (×24): 2 via RESPIRATORY_TRACT
  Filled 2022-10-13: qty 8.8

## 2022-10-13 MED ORDER — OXYCODONE HCL 5 MG PO TABS
5.0000 mg | ORAL_TABLET | ORAL | Status: DC | PRN
  Administered 2022-10-13 – 2022-10-18 (×2): 5 mg via ORAL
  Filled 2022-10-13 (×3): qty 1

## 2022-10-13 MED ORDER — CARBOXYMETHYLCELLUL-GLYCERIN 0.5-0.9 % OP SOLN: 1.0000 [drp] | Freq: Every day | OPHTHALMIC | Status: DC | PRN

## 2022-10-13 MED ORDER — PANTOPRAZOLE SODIUM 40 MG PO TBEC
40.0000 mg | DELAYED_RELEASE_TABLET | Freq: Every day | ORAL | Status: DC
  Administered 2022-10-13 – 2022-10-25 (×13): 40 mg via ORAL
  Filled 2022-10-13 (×13): qty 1

## 2022-10-13 MED ORDER — ALBUTEROL SULFATE (2.5 MG/3ML) 0.083% IN NEBU
2.5000 mg | INHALATION_SOLUTION | RESPIRATORY_TRACT | Status: DC | PRN
  Filled 2022-10-13: qty 3

## 2022-10-13 MED ORDER — HYDRALAZINE HCL 20 MG/ML IJ SOLN: 5.0000 mg | INTRAMUSCULAR | Status: DC | PRN

## 2022-10-13 MED ORDER — ROSUVASTATIN CALCIUM 20 MG PO TABS
20.0000 mg | ORAL_TABLET | Freq: Every day | ORAL | Status: DC
  Administered 2022-10-13 – 2022-10-25 (×13): 20 mg via ORAL
  Filled 2022-10-13 (×13): qty 1

## 2022-10-13 MED ORDER — APIXABAN 2.5 MG PO TABS
2.5000 mg | ORAL_TABLET | Freq: Two times a day (BID) | ORAL | Status: DC
  Administered 2022-10-13 – 2022-10-25 (×24): 2.5 mg via ORAL
  Filled 2022-10-13 (×24): qty 1

## 2022-10-13 MED ORDER — SPIRONOLACTONE 25 MG PO TABS
25.0000 mg | ORAL_TABLET | Freq: Every day | ORAL | Status: DC
  Administered 2022-10-13 – 2022-10-16 (×4): 25 mg via ORAL
  Filled 2022-10-13 (×4): qty 1

## 2022-10-13 MED ORDER — POLYETHYLENE GLYCOL 3350 17 G PO PACK: 17.0000 g | PACK | Freq: Every day | ORAL | Status: DC | PRN

## 2022-10-13 MED ORDER — ONDANSETRON HCL 4 MG PO TABS: 4.0000 mg | ORAL_TABLET | Freq: Four times a day (QID) | ORAL | Status: DC | PRN

## 2022-10-13 MED ORDER — SODIUM CHLORIDE 0.9 % IV SOLN: 500.0000 mg | INTRAVENOUS | Status: DC

## 2022-10-13 MED ORDER — INSULIN GLARGINE-YFGN 100 UNIT/ML ~~LOC~~ SOLN
10.0000 [IU] | Freq: Every evening | SUBCUTANEOUS | Status: DC
  Administered 2022-10-14 – 2022-10-24 (×11): 10 [IU] via SUBCUTANEOUS
  Filled 2022-10-13 (×14): qty 0.1

## 2022-10-13 MED ORDER — ACETAMINOPHEN 325 MG PO TABS
650.0000 mg | ORAL_TABLET | Freq: Four times a day (QID) | ORAL | Status: DC | PRN
  Administered 2022-10-16 – 2022-10-24 (×5): 650 mg via ORAL
  Filled 2022-10-13 (×6): qty 2

## 2022-10-13 MED ORDER — ALBUTEROL SULFATE HFA 108 (90 BASE) MCG/ACT IN AERS: 2.0000 | INHALATION_SPRAY | Freq: Four times a day (QID) | RESPIRATORY_TRACT | Status: DC | PRN

## 2022-10-13 MED ORDER — ONDANSETRON HCL 4 MG/2ML IJ SOLN
4.0000 mg | Freq: Four times a day (QID) | INTRAMUSCULAR | Status: DC | PRN
  Administered 2022-10-17: 4 mg via INTRAVENOUS
  Filled 2022-10-13: qty 2

## 2022-10-13 MED ORDER — DOCUSATE SODIUM 100 MG PO CAPS
100.0000 mg | ORAL_CAPSULE | Freq: Two times a day (BID) | ORAL | Status: DC
  Administered 2022-10-13 – 2022-10-25 (×9): 100 mg via ORAL
  Filled 2022-10-13 (×20): qty 1

## 2022-10-13 MED ORDER — FLUOXETINE HCL 20 MG PO CAPS
20.0000 mg | ORAL_CAPSULE | Freq: Every day | ORAL | Status: DC
  Administered 2022-10-13 – 2022-10-14 (×2): 20 mg via ORAL
  Filled 2022-10-13 (×2): qty 1

## 2022-10-13 MED ORDER — LACTATED RINGERS IV SOLN: INTRAVENOUS | Status: AC

## 2022-10-13 MED ORDER — INSULIN ASPART 100 UNIT/ML IJ SOLN
0.0000 [IU] | Freq: Three times a day (TID) | INTRAMUSCULAR | Status: DC
  Administered 2022-10-14 (×2): 2 [IU] via SUBCUTANEOUS
  Administered 2022-10-15 – 2022-10-16 (×3): 3 [IU] via SUBCUTANEOUS
  Administered 2022-10-16: 2 [IU] via SUBCUTANEOUS
  Administered 2022-10-17 (×2): 3 [IU] via SUBCUTANEOUS
  Administered 2022-10-17: 2 [IU] via SUBCUTANEOUS
  Administered 2022-10-18: 3 [IU] via SUBCUTANEOUS
  Administered 2022-10-18: 2 [IU] via SUBCUTANEOUS
  Administered 2022-10-18: 3 [IU] via SUBCUTANEOUS
  Administered 2022-10-19: 11 [IU] via SUBCUTANEOUS
  Administered 2022-10-19: 3 [IU] via SUBCUTANEOUS
  Administered 2022-10-19 – 2022-10-21 (×3): 2 [IU] via SUBCUTANEOUS
  Administered 2022-10-21: 3 [IU] via SUBCUTANEOUS
  Administered 2022-10-22: 2 [IU] via SUBCUTANEOUS
  Administered 2022-10-22: 5 [IU] via SUBCUTANEOUS
  Administered 2022-10-22 – 2022-10-23 (×2): 2 [IU] via SUBCUTANEOUS

## 2022-10-13 MED ORDER — SODIUM CHLORIDE 0.9% FLUSH
3.0000 mL | Freq: Two times a day (BID) | INTRAVENOUS | Status: DC
  Administered 2022-10-13 – 2022-10-24 (×24): 3 mL via INTRAVENOUS

## 2022-10-13 MED ORDER — CLOPIDOGREL BISULFATE 75 MG PO TABS
75.0000 mg | ORAL_TABLET | Freq: Every day | ORAL | Status: DC
  Administered 2022-10-13 – 2022-10-25 (×13): 75 mg via ORAL
  Filled 2022-10-13 (×13): qty 1

## 2022-10-13 MED ORDER — GUAIFENESIN ER 600 MG PO TB12
600.0000 mg | ORAL_TABLET | Freq: Two times a day (BID) | ORAL | Status: DC | PRN
  Administered 2022-10-18 – 2022-10-21 (×3): 600 mg via ORAL
  Filled 2022-10-13 (×5): qty 1

## 2022-10-13 NOTE — ED Provider Notes (Signed)
Emergency Department Provider Note   I have reviewed the triage vital signs and the nursing notes.   HISTORY  Chief Complaint Bradycardia and Dizziness   HPI [name] is a 88 y.o. male with PMH of CAD, AS, CHF, DM, HTN, HLD presents to the ED with weakness and bradycardia. Patient tells me he has felt weak for the last month but especially bad in the last week. No fever, chills, CP. He is compliant with his home meds and does not think he took too much. This AM, he felt particularly bad and tells me "I thought I was going to die." EMS was called and the patient was found to have intermittent bradycardia to the 30s with one episode en route of HR < 20. He received atropine and symptoms/HR improved.   Past Medical History:  Diagnosis Date   Aortic stenosis, moderate 09/20/2020   Arthritis    CAD (coronary artery disease)    a. Cath September 2015 LIMA to the LAD patent, SVG to PDA patent, SVG to posterior lateral patent, SVG to OM with a 90% in-stent restenosis at an anastomotic lesion. This was treated with angioplasty. b. cath 04/01/2015 95% ISR in SVG to OM treated with 2.75x24 Synergy DES postdilated to 3.72m, all other grafts patent   Cataract    surgery,B/L   CHF (congestive heart failure) (HCC)    Chronic kidney disease    nephrolithiasis   Diabetes mellitus    TYPE 2   GERD (gastroesophageal reflux disease)    Hernia    Hypercholesterolemia    Hypertension    Incontinence    hx  over 1 year,leaks without awareness   Other and unspecified diseases of appendix    [place]    in the setting of ischemia on 03/31/2015   Pancreatitis    [place] ([facility]   prostate bx=Adenocarcinoam,gleason 3+4=7,PSA=6.75   Shortness of breath dyspnea    Ulcer    hx gastric    Review of Systems  Constitutional: No fever/chills. Positive weakness.  Cardiovascular: Denies chest pain. Bradycardia.  Respiratory: Denies shortness of  breath. Gastrointestinal: No abdominal pain.   Genitourinary: Negative for dysuria. Musculoskeletal: Negative for back pain. Skin: Negative for rash. Neurological: Negative for headaches.  ____________________________________________   PHYSICAL EXAM:  VITAL SIGNS: Vitals:   10/13/22 0418 10/13/22 0530  BP:  120/73  Pulse:  65  Resp:  18  Temp: 97.9 F (36.6 C)   SpO2:  95%   Constitutional: Alert and oriented. Well appearing and in no acute distress. Eyes: Conjunctivae are normal.  Head: Atraumatic. Nose: No congestion/rhinnorhea. Mouth/Throat: Mucous membranes are moist.  Neck: No stridor.  Cardiovascular: A fib. Good peripheral circulation. Grossly normal heart sounds.   Respiratory: Normal respiratory effort.  No retractions. Lungs CTAB. Gastrointestinal: Soft and nontender. No distention.  Musculoskeletal: No lower extremity tenderness nor edema. No gross deformities of extremities. Neurologic:  Normal speech and language. No gross focal neurologic deficits are appreciated.  Skin:  Skin is warm, dry and intact. No rash noted.  ____________________________________________   LABS (all labs ordered are listed, but only abnormal results are displayed)  Labs Reviewed  CBC WITH DIFFERENTIAL/PLATELET - Abnormal; Notable for the following components:      Result Value   Hemoglobin 12.1 (*)    HCT 35.4 (*)    RDW 18.1 (*)    Platelets 79 (*)    Lymphs Abs 0.6 (*)    Abs Immature Granulocytes  0.14 (*)    All other components within normal limits  TSH - Abnormal; Notable for the following components:   TSH 5.697 (*)    All other components within normal limits  BASIC METABOLIC PANEL - Abnormal; Notable for the following components:   Sodium 127 (*)    Potassium 5.3 (*)    Chloride 96 (*)    CO2 21 (*)    Glucose, Bld 118 (*)    BUN 50 (*)    Creatinine, Ser 2.39 (*)    GFR, Estimated 26 (*)    All other components within normal limits  TROPONIN I (HIGH  SENSITIVITY) - Abnormal; Notable for the following components:   Troponin I (High Sensitivity) 43 (*)    All other components within normal limits  RESP PANEL BY RT-PCR (RSV, FLU A&B, COVID)  RVPGX2  CULTURE, BLOOD (ROUTINE X 2)  CULTURE, BLOOD (ROUTINE X 2)  MAGNESIUM  T4, FREE  T3  TROPONIN I (HIGH SENSITIVITY)   ____________________________________________  EKG   EKG Interpretation  Date/Time:  Wednesday October 13 2022 04:09:41 EST Ventricular Rate:  73 PR Interval:    QRS Duration: 168 QT Interval:  455 QTC Calculation: 502 R Axis:   99 Text Interpretation: Atrial fibrillation Ventricular premature complex Nonspecific intraventricular conduction delay Borderline repol abnormality, diffuse leads Similar to Dec 2023 tracing Confirmed by Nanda Quinton [phone]) on 10/13/2022 4:15:52 AM        ____________________________________________  RADIOLOGY  DG Chest Portable 1 View  Result Date: 10/13/2022 CLINICAL DATA:  88 year old male with history of shortness of breath. EXAM: PORTABLE CHEST 1 VIEW COMPARISON:  Chest x-ray 10/01/2022. FINDINGS: Lung volumes are low. Ill-defined airspace consolidation in the periphery of the lower right lung. Probable areas of subsegmental atelectasis in the base of the left lung. Mild blunting of the costophrenic sulci bilaterally suggesting trace bilateral pleural effusions. No pneumothorax. Cardiomegaly with cephalization of the pulmonary vasculature, but no frank pulmonary edema. Upper mediastinal contours are within normal limits. Atherosclerotic calcifications in the thoracic aorta. Status post median sternotomy for CABG. Patient is also status post TAVR. IMPRESSION: 1. New area of ill-defined airspace consolidation in the periphery of the right lung base either in the right middle or right lower lobe, concerning for developing pneumonia. Followup PA and lateral chest X-ray is recommended in 3-4 weeks following trial of antibiotic therapy to  ensure resolution and exclude underlying malignancy. 2. Additional left basilar subsegmental atelectasis or scarring. 3. Trace bilateral pleural effusions. 4. Cardiomegaly with pulmonary venous congestion, but no frank pulmonary edema. 5. Aortic atherosclerosis. Electronically Signed   By: Vinnie Langton M.D.   On: 10/13/2022 05:58    ____________________________________________   PROCEDURES  Procedure(s) performed:   Procedures  CRITICAL CARE Performed by: Margette Fast Total critical care time: 35 minutes Critical care time was exclusive of separately billable procedures and treating other patients. Critical care was necessary to treat or prevent imminent or life-threatening deterioration. Critical care was time spent personally by me on the following activities: development of treatment plan with patient and/or surrogate as well as nursing, discussions with consultants, evaluation of patient's response to treatment, examination of patient, obtaining history from patient or surrogate, ordering and performing treatments and interventions, ordering and review of laboratory studies, ordering and review of radiographic studies, pulse oximetry and re-evaluation of patient's condition.  Nanda Quinton, MD Emergency Medicine  ____________________________________________   INITIAL IMPRESSION / ASSESSMENT AND PLAN / ED COURSE  Pertinent labs & imaging results that  were available during my care of the patient were reviewed by me and considered in my medical decision making (see chart for details).   This patient is Presenting for Evaluation of weakness, which does require a range of treatment options, and is a complaint that involves a high risk of morbidity and mortality.  The Differential Diagnoses includes AKI, electrolyte abnormality, ACS, AS, arrhythmia, sepsis, COVID, Flu, etc.  Critical Interventions-    Medications  cefTRIAXone (ROCEPHIN) 1 g in sodium chloride 0.9 % 100 mL IVPB  (has no administration in time range)  azithromycin (ZITHROMAX) 500 mg in sodium chloride 0.9 % 250 mL IVPB (has no administration in time range)    Reassessment after intervention: Patient feeling well but additional dizzy episode in the ED with bradycardia that resolved.   I did obtain Additional Historical Information from EMS.  I decided to review pertinent External Data, and in summary patient's last admit was early December with COVID and bronchitis with AKI.    Clinical Laboratory Tests Ordered, included TSH mildly elevated. Have added T3/4. COVID likely still positive from earlier this month but now Flu A positive. Mild hyperkalemia at 5.3 with normal Mg. Troponin near baseline at 43.   Radiologic Tests Ordered, included CXR. I independently interpreted the images and agree with radiology interpretation.   Cardiac Monitor Tracing which shows A fib (rate 70s).   06:24 AM  Made aware that patient had a brief bradycardia episode with spontaneous resolution.     Social Determinants of Health Risk patient is a non-smoker.   Consult complete with Cardiology, Dr. Angelena Form. Hold AV blocking agents and will admit to medicine. They will consult.   Medical Decision Making: Summary:  Patient presents to the ED with weakness and bradycardia PTA. HR improved on my initial assessment but required atropine in the field. No history to support med overdose. No acute ischemic changes. Will obtain lyts, TSH, troponin. Do not see a high grade block requiring emergent pacer or Cardiology or consultation at this time.   Reevaluation with update and discussion with patient. Plan for admit. Will cover with abx as I am not sure in infiltrate on CXR is viral PNA or post-viral pneumonia.   Patient's presentation is most consistent with acute presentation with potential threat to life or bodily function.   Disposition: admit  ____________________________________________  FINAL CLINICAL IMPRESSION(S) /  ED DIAGNOSES  Final diagnoses:  Community acquired pneumonia of right middle lobe of lung  Bradycardia  Generalized weakness    Note:  This document was prepared using Dragon voice recognition software and may include unintentional dictation errors.  Nanda Quinton, MD, [facility] Emergency Medicine    Marrah Vanevery, [facility], MD 10/13/22 [phone]

## 2022-10-13 NOTE — Progress Notes (Signed)
  Echocardiogram 2D Echocardiogram has been performed.  [name] 10/13/2022, 3:17 PM

## 2022-10-13 NOTE — Telephone Encounter (Signed)
Verbal to move nursing and speech eval to next week.

## 2022-10-13 NOTE — Telephone Encounter (Signed)
Kelli informed of verbal orders

## 2022-10-13 NOTE — ED Triage Notes (Signed)
Bib [facility] Pt c/o dizziness and weakness , hx afib and heart failure. Pt hr of 30 with ems radial pulses weak and irregular. Pt c/o feeling weak and dizzy and will pass out ems gave atropine ('1mg'$ ) pt stated feeling better but still weak during transport on arrival to hospital ems stated pt was very pale pt stated didn't feel good hr was 15 for 30 seconds then it resolved on its own and color came back.   Ems Vital Bp- 115/82 Cbg- 163 Oxygen 94  Ems meds '1mg'$  atropine

## 2022-10-13 NOTE — H&P (Signed)
History and Physical    Patient: [name] HTD:[id number] DOB: [birth year] DOA: 10/13/2022 DOS: the patient was seen and examined on 10/13/2022 PCP: [name], MD  Patient coming from: Home - lives with wife; Donald Prose: Wife, [phone], [phone]   Chief Complaint: Weakness  HPI: [name] is a 88 y.o. male with medical history significant of chronic diastolic CHF, DM, HTN, HLD, afib, and pancreatic cancer presenting with weakness after COVID infection.   He has not been sleeping and eating well for several days  He is dizzy and has been talking in his sleep  He has been weak.  He hasn't been well recently - RSV -> PNA -> COVID -> flu.  He wore a monitor for a few weeks and had a few episodes of bradycardia.  He has had a time or two of presyncope.    He has had an antibiotic and a medication for thrush recently (cefdinir, fluconazole).    During the evaluation, he had about 1 minute of HR in the 30s that slowly resolved spontaneously.  He had some recurrence of symptoms during the episode.    ER Course:  Carryover, per Dr. Alcario Drought:  Pt with weakness, bradycardia.  Recently had COVID.  Not hypoxic.  Now Influenza A +.    Bradycardic with long pauses. Given atropine.  Cards going to see pt.  Says hold AV node blockers, thinking it might be coreg.      Review of Systems: As mentioned in the history of present illness. All other systems reviewed and are negative. Past Medical History:  Diagnosis Date   Aortic stenosis, moderate 09/20/2020   Arthritis    CAD (coronary artery disease)    a. Cath September 2015 LIMA to the LAD patent, SVG to PDA patent, SVG to posterior lateral patent, SVG to OM with a 90% in-stent restenosis at an anastomotic lesion. This was treated with angioplasty. b. cath 04/01/2015 95% ISR in SVG to OM treated with 2.75x24 Synergy DES postdilated to 3.54m, all other grafts patent   Cataract    surgery,B/L   CHF (congestive heart failure) (HCC)     Chronic kidney disease    nephrolithiasis   Diabetes mellitus    TYPE 2   GERD (gastroesophageal reflux disease)    Hernia    Hypercholesterolemia    Hypertension    Incontinence    hx  over 1 year,leaks without awareness   Other and unspecified diseases of appendix    PAF (paroxysmal atrial fibrillation) ([facility]    in the setting of ischemia on 03/31/2015   Pancreatitis    [place]   prostate bx=Adenocarcinoam,gleason 3+4=7,PSA=6.75   Shortness of breath dyspnea    Ulcer    hx gastric   Past Surgical History:  Procedure Laterality Date   APPENDECTOMY     BACK SURGERY     CARDIAC CATHETERIZATION N/A 04/01/2015   Procedure: Left Heart Cath and Cors/Grafts Angiography;  Surgeon: JJettie Booze MD;  [facility];  Service: Cardiovascular;  Laterality: N/A;   CARDIAC CATHETERIZATION N/A 04/01/2015   Procedure: Coronary Stent Intervention;  Surgeon: JJettie Booze MD;  [facility];  Service: Cardiovascular;  Laterality: N/A;   CARDIOVERSION N/A 11/01/2019   Procedure: CARDIOVERSION;  Surgeon: OGeralynn Rile MD;  [facility]  Service: Cardiovascular;  Laterality: N/A;   CARPAL TUNNEL RELEASE  2004   both hands   CORONARY ARTERY BYPASS GRAFT  CORONARY BALLOON ANGIOPLASTY N/A 08/19/2021   Procedure: CORONARY BALLOON ANGIOPLASTY;  Surgeon: Burnell Blanks, MD;  Location: [place] [facility];  Service: Cardiovascular;  Laterality: N/A;   CORONARY STENT INTERVENTION N/A 09/23/2020   Procedure: CORONARY STENT INTERVENTION;  Surgeon: Sherren Mocha, MD;  Location: [place] [facility];  Service: Cardiovascular;  Laterality: N/A;   CYSTOSCOPY  08/23/12   incomplete emoptying bladder   HERNIA REPAIR     incision and drainage of right chest abscess     INTRAOPERATIVE TRANSTHORACIC ECHOCARDIOGRAM Left 04/28/2021   Procedure: INTRAOPERATIVE TRANSTHORACIC ECHOCARDIOGRAM;  Surgeon: Burnell Blanks, MD;   Location: [place];  Service: Open Heart Surgery;  Laterality: Left;   LAPAROSCOPIC CHOLECYSTECTOMY  09/2009   LEFT HEART CATH AND CORS/GRAFTS ANGIOGRAPHY N/A 08/19/2021   Procedure: LEFT HEART CATH AND CORS/GRAFTS ANGIOGRAPHY;  Surgeon: Burnell Blanks, MD;  Location: [place] [facility];  Service: Cardiovascular;  Laterality: N/A;   LEFT HEART CATHETERIZATION WITH CORONARY ANGIOGRAM N/A 07/19/2014   Procedure: LEFT HEART CATHETERIZATION WITH CORONARY ANGIOGRAM;  Surgeon: Leonie Man, MD;  Location: [facility];  Service: Cardiovascular;  Laterality: N/A;   RECTAL SURGERY     RIGHT/LEFT HEART CATH AND CORONARY/GRAFT ANGIOGRAPHY N/A 09/23/2020   Procedure: RIGHT/LEFT HEART CATH AND CORONARY/GRAFT ANGIOGRAPHY;  Surgeon: Sherren Mocha, MD;  Location: [place] [facility];  Service: Cardiovascular;  Laterality: N/A;   ROBOT ASSISTED LAPAROSCOPIC RADICAL PROSTATECTOMY  2005   TRANSCATHETER AORTIC VALVE REPLACEMENT, TRANSFEMORAL Bilateral 04/28/2021   Procedure: TRANSCATHETER AORTIC VALVE REPLACEMENT, TRANSFEMORAL;  Surgeon: Burnell Blanks, MD;  Location: [place];  Service: Open Heart Surgery;  Laterality: Bilateral;   ULTRASOUND GUIDANCE FOR VASCULAR ACCESS Bilateral 04/28/2021   Procedure: ULTRASOUND GUIDANCE FOR VASCULAR ACCESS;  Surgeon: Burnell Blanks, MD;  Location: [place];  Service: Open Heart Surgery;  Laterality: Bilateral;   Social History:  reports that he quit smoking about 50 years ago. His smoking use included cigarettes. He has a 2.50 pack-year smoking history. He has never used smokeless tobacco. He reports that he does not drink alcohol and does not use drugs.  Allergies  Allergen Reactions   Codeine Other (See Comments)    Makes him feel goofy   Morphine Other (See Comments)    Nightmares and felt bad    Family History  Problem Relation Age of Onset   Cancer Mother        stomach   Heart attack Father     Prior to Admission medications   Medication Sig  Start Date End Date Taking? Authorizing Provider  acetaminophen (TYLENOL) 500 MG tablet Take 1,000 mg by mouth every 8 (eight) hours as needed for mild pain or moderate pain.   Yes [provider]  albuterol (VENTOLIN HFA) 108 (90 Base) MCG/ACT inhaler TAKE 2 PUFFS BY MOUTH EVERY 6 HOURS AS NEEDED Patient taking differently: Inhale 2 puffs into the lungs every 6 (six) hours as needed for wheezing or shortness of breath. 10/07/22  Yes Spero Geralds, MD  amLODipine (NORVASC) 5 MG tablet TAKE 1 TABLET BY MOUTH EVERY DAY Patient taking differently: Take 5 mg by mouth daily. 08/09/22  Yes Burchette, Alinda Sierras, MD  benzonatate (TESSALON) 100 MG capsule Take 1 capsule (100 mg total) by mouth every 8 (eight) hours. Patient taking differently: Take 100 mg by mouth 3 (three) times daily as needed for cough. 09/24/22  Yes Thurnell Lose, MD  Carboxymethylcellul-Glycerin (LUBRICATING EYE DROPS OP) Place 1 drop into both eyes  daily as needed (dry eyes).   Yes [provider]  carvedilol (COREG) 6.25 MG tablet TAKE 1 TABLET BY MOUTH 2 TIMES DAILY WITH A MEAL. 08/10/22  Yes [name], MD  cefdinir (OMNICEF) 300 MG capsule Take 1 capsule (300 mg total) by mouth 2 (two) times daily. 10/08/22  Yes Burchette, Alinda Sierras, MD  clopidogrel (PLAVIX) 75 MG tablet TAKE 1 TABLET BY MOUTH DAILY WITH BREAKFAST. Patient taking differently: Take 75 mg by mouth daily. 04/23/22  Yes [name], MD  Cyanocobalamin (VITAMIN B-12 PO) Take 1 tablet by mouth daily.   Yes [provider]  DM-Doxylamine-Acetaminophen (NYQUIL COLD & FLU PO) Take 30 mLs by mouth 2 (two) times daily as needed (for flu like symptoms).   Yes [provider]  docusate sodium (STOOL SOFTENER) 100 MG capsule Take 1 capsule (100 mg total) by mouth 2 (two) times daily as needed for mild constipation. 07/28/19  Yes Ivor Costa, MD  ELIQUIS 2.5 MG TABS tablet TAKE 1 TABLET BY MOUTH TWICE A DAY Patient taking differently:  Take 2.5 mg by mouth 2 (two) times daily. 08/26/22  Yes Vickie Epley, MD  fluconazole (DIFLUCAN) 200 MG tablet Take 1 tablet (200 mg total) by mouth daily. 10/01/22  Yes Burchette, Alinda Sierras, MD  FLUoxetine (PROZAC) 20 MG capsule Take 1 capsule (20 mg total) by mouth daily. 07/28/22  Yes Burchette, Alinda Sierras, MD  Homeopathic Products ([facility] EX) Apply 1 application topically daily as needed (knee pain).   Yes [provider]  hydrocortisone 2.5 % cream Apply 1 application topically daily as needed (facial breakouts).   Yes [provider]  JANUVIA 50 MG tablet TAKE 1 TABLET BY MOUTH EVERY DAY Patient taking differently: Take 50 mg by mouth daily. 10/01/22  Yes Burchette, Alinda Sierras, MD  Multiple Vitamins-Minerals (ICAPS AREDS 2 PO) Take 1 capsule by mouth daily.   Yes [provider]  nitroGLYCERIN (NITROSTAT) 0.4 MG SL tablet PLACE 1 TABLET UNDER THE TONGUE EVERY 5 MINUTES X 3 DOSES AS NEEDED FOR CHEST PAIN Patient taking differently: Place 0.4 mg under the tongue every 5 (five) minutes as needed for chest pain. 01/29/22  Yes Hochrein, Jeneen Rinks, MD  pantoprazole (PROTONIX) 40 MG tablet TAKE 1 TABLET BY MOUTH EVERY DAY 10/01/22  Yes Burchette, Alinda Sierras, MD  potassium chloride (KLOR-CON) 10 MEQ tablet TAKE 1 TABLET BY MOUTH EVERY DAY 10/01/22  Yes Burchette, Alinda Sierras, MD  rosuvastatin (CRESTOR) 20 MG tablet TAKE 1 TABLET BY MOUTH EVERY DAY 10/01/22  Yes Burchette, Alinda Sierras, MD  spironolactone (ALDACTONE) 25 MG tablet TAKE 1 TABLET (25 MG TOTAL) BY MOUTH DAILY. Patient taking differently: Take 25 mg by mouth daily. 12/31/21  Yes [name], MD  SYMBICORT 160-4.5 MCG/ACT inhaler INHALE 2 PUFFS INTO THE LUNGS IN THE MORNING AND AT BEDTIME. 10/01/22  Yes Burchette, Alinda Sierras, MD  torsemide (DEMADEX) 20 MG tablet TAKE 1 TABLET BY MOUTH EVERY DAY 07/20/22  Yes Burchette, Alinda Sierras, MD  TRESIBA FLEXTOUCH 100 UNIT/ML FlexTouch Pen INJECT 10 UNITS INTO THE SKIN DAILY Patient taking  differently: Inject 10 Units into the skin every evening. 03/08/22  Yes Burchette, Alinda Sierras, MD  BD PEN NEEDLE NANO 2ND GEN 32G X 4 MM MISC USE AS DIRECTED. 06/02/22   Burchette, Alinda Sierras, MD  blood glucose meter kit and supplies KIT Dispense based on patient and insurance preference. Use up to four times daily as directed. (FOR ICD-9 250.00, 250.01). 09/08/20   [place] Shi,  MD  methylPREDNISolone (MEDROL DOSEPAK) 4 MG TBPK tablet follow package directions Patient not taking: Reported on 10/13/2022 09/24/22   Thurnell Lose, MD    Physical Exam: Vitals:   10/13/22 0930 10/13/22 1000 10/13/22 1200 10/13/22 1526  BP: 111/64 110/72 111/77 (!) 120/56  Pulse: 95 82 80 69  Resp: 16 (!) 22 (!) 21 20  Temp:    97.6 F (36.4 C)  TempSrc:    Oral  SpO2: 97% 93% 93% 91%  Height:    5' 9" (1.753 m)   General:  Appears calm and comfortable and is in NAD, mildly ill, on 3L [place] O2 Eyes:   EOMI, normal lids, iris ENT:  grossly normal hearing, lips & tongue, mmm Neck:  no LAD, masses or thyromegaly Cardiovascular:  Irregularly irregular with rate control throughout evaluation but with an episode of witnessed symptomatic bradycardia during evaluation, no m/r/g. No LE edema.  Respiratory:   CTA bilaterally with no wheezes/rales/rhonchi.  Normal respiratory effort. Abdomen:  soft, NT, ND Skin:  no rash or induration seen on limited exam Musculoskeletal:  grossly normal tone BUE/BLE, good ROM, no bony abnormality Psychiatric:  grossly normal mood and affect, speech fluent and appropriate, AOx3 Neurologic:  CN 2-12 grossly intact, moves all extremities in coordinated fashion   Radiological Exams on Admission: Independently reviewed - see discussion in A/P where applicable  ECHOCARDIOGRAM COMPLETE  Result Date: 10/13/2022    ECHOCARDIOGRAM REPORT   Patient Name:   [name] Hand Date of Exam: 10/13/2022 Medical Rec #:  [id number]    Height:       69.0 in Accession #:    [id number]   Weight:        147.6 lb Date of Birth:  [birth year]    BSA:          1.816 m Patient Age:    15 years     BP:           121/67 mmHg Patient Gender: M            HR:           75 bpm. Exam Location:  Inpatient Procedure: 2D Echo, Cardiac Doppler and Color Doppler Indications:    Chest Pain R07.9  History:        Patient has prior history of Echocardiogram examinations, most                 recent 02/06/2022. Cardiomyopathy, Aortic Valve Disease; Risk                 Factors:Diabetes.                 Aortic Valve: 29 mm Medtronic valve is present in the aortic                 position. Procedure Date: 04/28/21.  Sonographer:    Greer Pickerel Referring Phys: [id number] Margie Billet  Sonographer Comments: No subcostal window. Image acquisition challenging due to patient body habitus and Image acquisition challenging due to respiratory motion. IMPRESSIONS  1. Left ventricular ejection fraction, by estimation, is 25 to 30%. The left ventricle has severely decreased function. The left ventricle demonstrates global hypokinesis that appears worse in the septal segments. There is moderate concentric left ventricular hypertrophy. Left ventricular diastolic parameters are indeterminate.  2. Right ventricular systolic function is severely reduced. The right ventricular size is normal. PASP is 39.44mHg +RAP.  3. Left atrial size was severely dilated.  4. Right atrial  size was severely dilated.  5. The mitral valve is degenerative. Mild mitral valve regurgitation. Moderate to severe mitral annular calcification.  6. The aortic valve has been repaired/replaced. Aortic valve regurgitation is trivial. There is a 29 mm Medtronic valve present in the aortic position. Procedure Date: 04/28/21. Mean gradient 1.73mHg with Vmax 0.95m in the setting of low output state. LVOT VTI 12.4.  7. Aortic dilatation noted. There is mild dilatation of the ascending aorta, measuring 40 mm. Comparison(s): Compared to prior TTE on 05/2022, the LVEF has decreased to 25-30% with  continued septal wall motion abnormalites. FINDINGS  Left Ventricle: Left ventricular ejection fraction, by estimation, is 25 to 30%. The left ventricle has severely decreased function. There is global hypokinesis that appears worse in the septal segments. The left ventricular internal cavity size was normal in size. There is moderate concentric left ventricular hypertrophy. Left ventricular diastolic parameters are indeterminate. Right Ventricle: The right ventricular size is normal. No increase in right ventricular wall thickness. Right ventricular systolic function is severely reduced. Left Atrium: Left atrial size was severely dilated. Right Atrium: Right atrial size was severely dilated. Pericardium: There is no evidence of pericardial effusion. Mitral Valve: The mitral valve is degenerative in appearance. There is moderate thickening of the mitral valve leaflet(s). There is mild calcification of the mitral valve leaflet(s). Moderate to severe mitral annular calcification. Mild mitral valve regurgitation. Tricuspid Valve: The tricuspid valve is normal in structure. Tricuspid valve regurgitation is trivial. Aortic Valve: The aortic valve has been repaired/replaced. Aortic valve regurgitation is trivial. There is a 29 mm Medtronic valve present in the aortic position. Procedure Date: 04/28/21. Pulmonic Valve: The pulmonic valve was normal in structure. Pulmonic valve regurgitation is trivial. Aorta: Aortic dilatation noted. There is mild dilatation of the ascending aorta, measuring 40 mm. IAS/Shunts: The atrial septum is grossly normal.  LEFT VENTRICLE PLAX 2D LVIDd:         4.20 cm      Diastology LVIDs:         3.30 cm      LV e' medial:  3.37 cm/s LV PW:         1.60 cm      LV e' lateral: 5.15 cm/s LV IVS:        1.30 cm LVOT diam:     2.20 cm LV SV:         37 LV SV Index:   20 LVOT Area:     3.80 cm  LV Volumes (MOD) LV vol d, MOD A2C: 88.8 ml LV vol d, MOD A4C: 102.0 ml LV vol s, MOD A2C: 54.1 ml LV vol s,  MOD A4C: 71.5 ml LV SV MOD A2C:     34.7 ml LV SV MOD A4C:     102.0 ml LV SV MOD BP:      34.6 ml RIGHT VENTRICLE RV S prime:     4.67 cm/s TAPSE (M-mode): 0.9 cm LEFT ATRIUM              Index        RIGHT ATRIUM           Index LA diam:        4.50 cm  2.48 cm/m   RA Area:     28.80 cm LA Vol (A2C):   115.0 ml 63.33 ml/m  RA Volume:   97.00 ml  53.42 ml/m LA Vol (A4C):   103.0 ml 56.73 ml/m LA Biplane Vol: 110.0 ml 60.58  ml/m  AORTIC VALVE             PULMONIC VALVE LVOT Vmax:   64.60 cm/s  PR End Diast Vel: 9.86 msec LVOT Vmean:  41.300 cm/s LVOT VTI:    0.098 m  AORTA Ao Root diam: 3.40 cm Ao Asc diam:  4.10 cm MITRAL VALVE             TRICUSPID VALVE MV Area (PHT): 4.15 cm  TR Peak grad:   39.4 mmHg MV Decel Time: 183 msec  TR Vmax:        314.00 cm/s                           SHUNTS                          Systemic VTI:  0.10 m                          Systemic Diam: 2.20 cm Gwyndolyn Kaufman MD Electronically signed by Gwyndolyn Kaufman MD Signature Date/Time: 10/13/2022/5:05:06 PM    Final    DG Chest Portable 1 View  Result Date: 10/13/2022 CLINICAL DATA:  88 year old male with history of shortness of breath. EXAM: PORTABLE CHEST 1 VIEW COMPARISON:  Chest x-ray 10/01/2022. FINDINGS: Lung volumes are low. Ill-defined airspace consolidation in the periphery of the lower right lung. Probable areas of subsegmental atelectasis in the base of the left lung. Mild blunting of the costophrenic sulci bilaterally suggesting trace bilateral pleural effusions. No pneumothorax. Cardiomegaly with cephalization of the pulmonary vasculature, but no frank pulmonary edema. Upper mediastinal contours are within normal limits. Atherosclerotic calcifications in the thoracic aorta. Status post median sternotomy for CABG. Patient is also status post TAVR. IMPRESSION: 1. New area of ill-defined airspace consolidation in the periphery of the right lung base either in the right middle or right lower lobe, concerning for  developing pneumonia. Followup PA and lateral chest X-ray is recommended in 3-4 weeks following trial of antibiotic therapy to ensure resolution and exclude underlying malignancy. 2. Additional left basilar subsegmental atelectasis or scarring. 3. Trace bilateral pleural effusions. 4. Cardiomegaly with pulmonary venous congestion, but no frank pulmonary edema. 5. Aortic atherosclerosis. Electronically Signed   By: Vinnie Langton M.D.   On: 10/13/2022 05:58    EKG: Independently reviewed. Afib with rate 73; prolonged QTc 502; IVCD; PVCs; no evidence of acute ischemia  Telemetry tracing with prolonged sinus pause:     Labs on Admission: I have personally reviewed the available labs and imaging studies at the time of the admission.  Pertinent labs:    Na++ 127 K+ 5.3 Glucose 118 BUN 50/Creatinine 2.39/GFR 26; 42/1.87/35 on 12/15 HS troponin 43, 45, 53 BNP 1046.9; 592/2 on 12/5 WBC 9.1 Hgb 12.1 TSH 5.697 Free T4 1.16 Lactate 1.2 Procalcitonin <0.10 Influenza A positive  COVID positive today and 12/4   Assessment and Plan: Principal Problem:   Influenza A Active Problems:   Stage 3b chronic kidney disease (CKD) (HCC)   Hypertension   Type 2 diabetes mellitus with hyperlipidemia (HCC)   Pure hypercholesterolemia   Persistent atrial fibrillation (HCC)   Systolic dysfunction with acute on chronic heart failure (HCC)   Symptomatic bradycardia    Influenza A -Patient with recent multiple infections, currently completing course of Cefdinir - although CXR progression does not apparently show PNA until today -Tested positive for  COVID on 12/4 as well as today - unlikely still infected with COVID -Also tested positive for flu today - although symptoms appear to have started on 12/22 and so he is at the very end of the window -It seems unlikely that he developed an acute bacterial PNA in the setting of documented flu infection, while on antibiotics - and procalcitonin is  negative -Will hold antibiotics for now and closely monitor -Supportive care -He is nearing the end of the window for Tamiflu so will not order at this time -IVF at 50 cc/hr x 10 hours -Observation on telemetry -Continue Dulera (for Symbicort), Albuterol  Symptomatic bradycardia with h/o afib -Afib with rate lability - clearly with intermittent pauses on telemetry and exam -TSH slightly elevated -Keep potassium >4, calcium >9, magnesium >2.  -Consult to cardiology/EP -Hold carvedilol -If he is having ongoing episodic symptomatic bradycardia without nodal therapy, he appears to need pacemaker -If symptoms improve off carvedilol, recommend consideration of loop recorder -Continue Eliquis  AKI on stage 3b CKD -Appears to be mildly hypovolemic in the setting of recurrent viral infections -Gentle hydration and then hold further IVF -Attempt to avoid nephrotoxic medications -Recheck BMP in AM   Chronic combined CHF -Echo today with EF 25-30% with global hypokinesis and LVH -BNP is elevated today but he appears to be euvolemic on exam -Will defer to cardiology -Continue Plavix -Continue spironolactone  DM -Will check A1c -hold Januvia -Continue Tresiba -Cover with moderate-scale SSI   HTN -Marginal BP -Hold amlodipine -Stop carvedilol  HLD -Continue atorvastatin    Advance Care Planning:   Code Status: Full Code - Code status was discussed with the patient and/or family at the time of admission.  The patient would want to receive full resuscitative measures at this time.   Consults: Cardiology; [facility] team; nutrition; PT/OT  DVT Prophylaxis: Eliquis  Family Communication: Wife and daughter were present throughout evaluation  Severity of Illness: The appropriate patient status for this patient is OBSERVATION. Observation status is judged to be reasonable and necessary in order to provide the required intensity of service to ensure the patient's safety. The patient's  presenting symptoms, physical exam findings, and initial radiographic and laboratory data in the context of their medical condition is felt to place them at decreased risk for further clinical deterioration. Furthermore, it is anticipated that the patient will be medically stable for discharge from the hospital within 2 midnights of admission.   Author: Karmen Bongo, MD 10/13/2022 6:14 PM  For on call review [email or web address].

## 2022-10-13 NOTE — ED Notes (Signed)
Pt had a bradycardic episode and a dizzy spell MD Long made aware

## 2022-10-13 NOTE — Consult Note (Signed)
Cardiology Consultation   Patient ID: ESTEBAN KOBASHIGAWA MRN: [id number]; DOB: 10/31/1932  Admit date: 10/13/2022 Date of Consult: 10/13/2022  PCP:  [name], MD   [place] Providers Cardiologist:  [name], MD  Cardiology APP:  Ledora Bottcher, [place]  Electrophysiologist:  Vickie Epley, MD  {    Patient Profile:   [name] is a 88 y.o. male with a complex PMH of CAD s/p CABG 2009 (LIMA to LAD, SVG to left posterior lateral branch, SVG to right PDA, SVG to OM) and multiple subsequent PCIs, LBBB, persistent atrial fibrillation on Eliquis, chronic combined CHF, low-flow low gradient aortic stenosis s/p TAVR 04/28/2021, carotid artery stenosis, hypertension, hyperlipidemia, type 2 diabetes, CKD stage III-IV, COPD with overlapping asthma, remote tobacco use, prostate cancer, who is being seen 10/13/2022 for evaluation of symptomatic bradycardia at the request of Dr. Lorin Mercy.    History of Present Illness:   Mr. Constante above complex medical history resented to the ER today complaining weakness and dizziness.  Patient states he has been feeling weak over the past month.  Over the past week he felt particularly worse.  This morning he thought he was going to die.  He felt significantly weak, dizzy, and thought he was going to pass out.  He called 911, EMS found patient very pale with irregular radial pulse at 30s.  He was given atropine 1 mg at the scene which improved his symptoms as well as bradycardia.  Per chart review:  He follows Dr Percival Spanish outpatient, had CABG in 2009, most recent [place] was from 08/19/21 showed severe triple vessel CAD s/p 4V CABG with 4/4 patent grafts including LIMA to LAD, SVG to left posterior lateral branch, SVG to OM1, and SVG to PDA, CTO of proximal RCA, 50% LM stenosis. Severe restenosis in the distal body of SVG to OM1 in the previously stented segment that was treated with successful PTCA. He was recommended 6 month of Plavix and  Eliquis.   He has persistent atrial fibrillation, monitor from 09/2021 showed 100% A-fib with ventricular bigeminy/trigeminy.  He failed DCCV in 10/2019.  He has been maintained on carvedilol and Eliquis historically.  He has systolic and diastolic heart failure, Echo from 2012-2021 with LVEF preserved 50-55%, RHC from 09/23/20 showed normal diastolic filling pressures in the right and left heart. Echo from 03/12/21 showed LVEF dropping to 45-50% with progression of aortic stenosis (see below). Echo from 02/06/22 with LVEF dropping to 30-35% (during acute CHF exacerbation hospitalization). Echo from 05/28/22 showed LVEF 35-40% (post TAVR follow up). His most recent hospitalization for CHF exacerbation was 01/2022 where he did require IV Lasix transition to p.o. torsemide at the time of discharge. He was last seen by APP Ms Duke on 04/13/22, has stable chronic right sided chest pain, volume status was stable on torsemide 20 mg twice daily, he was recommend continue current medical therapy including plavix, eliquis, PRN Nitro, torsemide, coreg, amlodipine, spironolactone crestor. BP was noted low at 90/60. He was reportedly not tolerating jardiance in the past.   He has had aortic stenosis that gradually progressed over the years.  Echo from 03/12/2021 revealed progression of aortic stenosis with LVEF 45 to 50%. The aortic valve is calcified with severe calcification, thickening, and restricted leaflet mobility involving all 3 leaflets.  Peak velocity across the aortic valve measured 2.7 m/s corresponding to mean transvalvular gradient estimated 21 mmHg but aortic valve area calculated only 0.63 cm with DVI notably quite low  at 0.20 and stroke-volume index only 21.  He subsequently underwent a successful TAVR with a 29 mm Medtronic Evolut Pro + THV via the TF approach on 04/28/21. Post operative echo showed EF 40%, normally functioning TAVR with a mean gradient of 4 mmHg and trivial PVL. Plavix was discontinued and he  was started on aspirin 81 mg daily and resumed on Eliquis. One month Echo s/p TAVR showed EF 40-45%, normally functioning TAVR with a mean gradient of 2 mm hg and trivial PVL.  Following that, he underwent LHC on 08/19/2021 due to chest pain, transition back to Plavix and Eliquis for 82-monthafter PTCA as mentioned above.  He last followed up with structural heart team on 05/28/2022, echo that day showed LVEF 35 to 40%, moderate RV dysfunction, normal functioning TAVR with mean gradient of 247mg, mild AI, moderate pulmonary hypertension, mild to moderate MR. He was felt doing better from CHF standpoint, activities are limited by generalized weakness,, neck continue Plavix and Eliquis.  Weight was noted to be 152 pounds that day.  He was advised to continue torsemide 20 mg a.m. and 20 mg p.m.  Due to report of syncope episode while playing cards without prodrome at appointment 05/28/2022, he was arranged for a event monitor which revealed atrial fibrillation, tachybradycardia episode with average heart rate 70s, 1 episode of 4.8-second pause, relatively frequent runs of ventricular tachycardia with longest being 15.5 seconds.  He was referred to EP Dr. LaQuentin Orey structural heart team, was seen on 08/10/2022, syncope episode was highly suspicious for arrhythmic event versus bradycardic episode with 4.8 pause, treatment for NSVTwas felt limited due to underlying bradycardia, he was recommended loop recorder implant for ongoing rhythm monitor, no PPM/ICD was felt needed at the time, and he was continued on carvedilol.  Noted he was recently discharged from hospital on 09/24/2022 after 4 days of hospitalization for COVID-19 pneumonia with bacterial bronchitis.  He had reported substernal chronic chest pain with high sensitive troponin 59>54, with no acute EKG changes, he was maintained on chronic medical management.  He also suffered AKI on CKD renal index had ultimately improved with hydration.   Upon encounter  today, patient states he has been feeling poor for 6 weeks. He had ongoing respiratory illness, believes he had RSV, then was hospitalized for COVID-19 pneumonia in the beginning of December 2023, and now has flu A.  He endorses shortness of breath with exertion and at rest, coughing with yellow sputum, generalized weakness, and dizziness. He felt dizzy all the time with position change, sometimes felt he could lose his balance or pass out, over the past week, he felt his dizziness is much worse.  He also noted ongoing midsternal chest pain, he is not clear if current chest pain is different from his chronic chest pain.  He reports midsternal chest pain with any exertional activity, episode seems more often than usual, sometimes associated with shortness of breath and coughing.  He has not been eating and drinking well over the past week, still taking all his cardiac medication as usual.  His family reports that he has a bedsore because he is mostly bedbound for the past 2 months.  He had an episode of syncope 2 months ago as mentioned above, saw EP, no syncope has reoccurred so far.   Admission diagnostic revealed worsening hyponatremia 127, hyperkalemia 5.3, bicarb 21, creatinine 2.39, BUN 50, GFR 26.  High sensitive troponin 43x1.  CBC revealed hemoglobin 12.1, platelet dropped to 79k.  TSH elevated  5.697.  FT4 elevated 1.16.  Influenza A and COVID-19 positive.  Blood culture x 2 NTD.  Chest x-ray showed new area of ill-defined airspace consolidation in the periphery of the right lung base either in the right middle or right lowerlobe, concerning for developing pneumonia. Left basilar subsegmental atelectasis or scarring. Trace bilateral pleural effusions and pulmonary venous congestion. EKG revealed atrial fibrillation with ventricular rate of 73, PVC, old LBBB.  He is afebrile, hemodynamically stable at ED.  He is admitted to hospital medicine service for AKI on CKD stage III with significant metabolic  derangement.  Cardiology is consulted for concern of symptomatic bradycardia.      Past Medical History:  Diagnosis Date   Aortic stenosis, moderate 09/20/2020   Arthritis    CAD (coronary artery disease)    a. Cath September 2015 LIMA to the LAD patent, SVG to PDA patent, SVG to posterior lateral patent, SVG to OM with a 90% in-stent restenosis at an anastomotic lesion. This was treated with angioplasty. b. cath 04/01/2015 95% ISR in SVG to OM treated with 2.75x24 Synergy DES postdilated to 3.31m, all other grafts patent   Cataract    surgery,B/L   CHF (congestive heart failure) (HCC)    Chronic kidney disease    nephrolithiasis   Diabetes mellitus    TYPE 2   GERD (gastroesophageal reflux disease)    Hernia    Hypercholesterolemia    Hypertension    Incontinence    hx  over 1 year,leaks without awareness   Other and unspecified diseases of appendix    [place]oxysmal [place] fibrillation) [place]    in the setting of ischemia on 03/31/2015   Pancreatitis    Prostate CA ([facility] 03/01/04   prostate bx=Adenocarcinoam,gleason 3+4=7,PSA=6.75   Shortness of breath dyspnea    Ulcer    hx gastric    Past Surgical History:  Procedure Laterality Date   APPENDECTOMY     BACK SURGERY     CARDIAC CATHETERIZATION N/A 04/01/2015   Procedure: Left Heart Cath and Cors/Grafts Angiography;  Surgeon: JJettie Booze MD;  [facility];  Service: Cardiovascular;  Laterality: N/A;   CARDIAC CATHETERIZATION N/A 04/01/2015   Procedure: Coronary Stent Intervention;  Surgeon: JJettie Booze MD;  [facility];  Service: Cardiovascular;  Laterality: N/A;   CARDIOVERSION N/A 11/01/2019   Procedure: CARDIOVERSION;  Surgeon: OGeralynn Rile MD;  [facility]  Service: Cardiovascular;  Laterality: N/A;   CARPAL TUNNEL RELEASE  2004   both hands   CORONARY ARTERY BYPASS GRAFT     CORONARY BALLOON ANGIOPLASTY N/A 08/19/2021   Procedure: CORONARY BALLOON  ANGIOPLASTY;  Surgeon: MBurnell Blanks MD;  [facility];  Service: Cardiovascular;  Laterality: N/A;   CORONARY STENT INTERVENTION N/A 09/23/2020   Procedure: CORONARY STENT INTERVENTION;  Surgeon: CSherren Mocha MD;  [facility];  Service: Cardiovascular;  Laterality: N/A;   CYSTOSCOPY  08/23/12   incomplete emoptying bladder   HERNIA REPAIR     incision and drainage of right chest abscess     INTRAOPERATIVE TRANSTHORACIC ECHOCARDIOGRAM Left 04/28/2021   Procedure: INTRAOPERATIVE TRANSTHORACIC ECHOCARDIOGRAM;  Surgeon: MBurnell Blanks MD;  [place] [facility]  Service: Open Heart Surgery;  Laterality: Left;   LAPAROSCOPIC CHOLECYSTECTOMY  09/2009   LEFT HEART CATH AND CORS/GRAFTS ANGIOGRAPHY N/A 08/19/2021   Procedure: LEFT HEART CATH AND CORS/GRAFTS ANGIOGRAPHY;  Surgeon: MBurnell Blanks MD;  [facility];  Service: Cardiovascular;  Laterality: N/A;   LEFT HEART CATHETERIZATION WITH CORONARY ANGIOGRAM N/A 07/19/2014   Procedure: LEFT HEART CATHETERIZATION WITH CORONARY ANGIOGRAM;  Surgeon: Leonie Man, MD;  Location: [facility] CATH LAB;  Service: Cardiovascular;  Laterality: N/A;   RECTAL SURGERY     RIGHT/LEFT HEART CATH AND CORONARY/GRAFT ANGIOGRAPHY N/A 09/23/2020   Procedure: RIGHT/LEFT HEART CATH AND CORONARY/GRAFT ANGIOGRAPHY;  Surgeon: Sherren Mocha, MD;  Location: [place] [facility];  Service: Cardiovascular;  Laterality: N/A;   ROBOT ASSISTED LAPAROSCOPIC RADICAL PROSTATECTOMY  2005   TRANSCATHETER AORTIC VALVE REPLACEMENT, TRANSFEMORAL Bilateral 04/28/2021   Procedure: TRANSCATHETER AORTIC VALVE REPLACEMENT, TRANSFEMORAL;  Surgeon: Burnell Blanks, MD;  Location: [place];  Service: Open Heart Surgery;  Laterality: Bilateral;   ULTRASOUND GUIDANCE FOR VASCULAR ACCESS Bilateral 04/28/2021   Procedure: ULTRASOUND GUIDANCE FOR VASCULAR ACCESS;  Surgeon: Burnell Blanks, MD;  Location: [place];  Service: Open  Heart Surgery;  Laterality: Bilateral;     Home Medications:  Prior to Admission medications   Medication Sig Start Date End Date Taking? Authorizing Provider  acetaminophen (TYLENOL) 500 MG tablet Take 1,000 mg by mouth every 8 (eight) hours as needed for mild pain or moderate pain.   Yes [provider]  albuterol (VENTOLIN HFA) 108 (90 Base) MCG/ACT inhaler TAKE 2 PUFFS BY MOUTH EVERY 6 HOURS AS NEEDED Patient taking differently: Inhale 2 puffs into the lungs every 6 (six) hours as needed for wheezing or shortness of breath. 10/07/22  Yes Spero Geralds, MD  amLODipine (NORVASC) 5 MG tablet TAKE 1 TABLET BY MOUTH EVERY DAY Patient taking differently: Take 5 mg by mouth daily. 08/09/22  Yes Burchette, Alinda Sierras, MD  benzonatate (TESSALON) 100 MG capsule Take 1 capsule (100 mg total) by mouth every 8 (eight) hours. Patient taking differently: Take 100 mg by mouth 3 (three) times daily as needed for cough. 09/24/22  Yes Thurnell Lose, MD  Carboxymethylcellul-Glycerin (LUBRICATING EYE DROPS OP) Place 1 drop into both eyes daily as needed (dry eyes).   Yes [provider]  carvedilol (COREG) 6.25 MG tablet TAKE 1 TABLET BY MOUTH 2 TIMES DAILY WITH A MEAL. 08/10/22  Yes [name], MD  cefdinir (OMNICEF) 300 MG capsule Take 1 capsule (300 mg total) by mouth 2 (two) times daily. 10/08/22  Yes Burchette, Alinda Sierras, MD  clopidogrel (PLAVIX) 75 MG tablet TAKE 1 TABLET BY MOUTH DAILY WITH BREAKFAST. Patient taking differently: Take 75 mg by mouth daily. 04/23/22  Yes [name], MD  Cyanocobalamin (VITAMIN B-12 PO) Take 1 tablet by mouth daily.   Yes [provider]  DM-Doxylamine-Acetaminophen (NYQUIL COLD & FLU PO) Take 30 mLs by mouth 2 (two) times daily as needed (for flu like symptoms).   Yes [provider]  docusate sodium (STOOL SOFTENER) 100 MG capsule Take 1 capsule (100 mg total) by mouth 2 (two) times daily as needed for mild constipation. 07/28/19   Yes Ivor Costa, MD  ELIQUIS 2.5 MG TABS tablet TAKE 1 TABLET BY MOUTH TWICE A DAY Patient taking differently: Take 2.5 mg by mouth 2 (two) times daily. 08/26/22  Yes Vickie Epley, MD  fluconazole (DIFLUCAN) 200 MG tablet Take 1 tablet (200 mg total) by mouth daily. 10/01/22  Yes Burchette, Alinda Sierras, MD  FLUoxetine (PROZAC) 20 MG capsule Take 1 capsule (20 mg total) by mouth daily. 07/28/22  Yes Burchette, Alinda Sierras, MD  Homeopathic Products [facility] RELIEF EX) Apply 1 application topically daily as needed (  knee pain).   Yes [provider]  hydrocortisone 2.5 % cream Apply 1 application topically daily as needed (facial breakouts).   Yes [provider]  JANUVIA 50 MG tablet TAKE 1 TABLET BY MOUTH EVERY DAY Patient taking differently: Take 50 mg by mouth daily. 10/01/22  Yes Burchette, Alinda Sierras, MD  Multiple Vitamins-Minerals (ICAPS AREDS 2 PO) Take 1 capsule by mouth daily.   Yes [provider]  nitroGLYCERIN (NITROSTAT) 0.4 MG SL tablet PLACE 1 TABLET UNDER THE TONGUE EVERY 5 MINUTES X 3 DOSES AS NEEDED FOR CHEST PAIN Patient taking differently: Place 0.4 mg under the tongue every 5 (five) minutes as needed for chest pain. 01/29/22  Yes Hochrein, Jeneen Rinks, MD  pantoprazole (PROTONIX) 40 MG tablet TAKE 1 TABLET BY MOUTH EVERY DAY 10/01/22  Yes Burchette, Alinda Sierras, MD  potassium chloride (KLOR-CON) 10 MEQ tablet TAKE 1 TABLET BY MOUTH EVERY DAY 10/01/22  Yes Burchette, Alinda Sierras, MD  rosuvastatin (CRESTOR) 20 MG tablet TAKE 1 TABLET BY MOUTH EVERY DAY 10/01/22  Yes Burchette, Alinda Sierras, MD  spironolactone (ALDACTONE) 25 MG tablet TAKE 1 TABLET (25 MG TOTAL) BY MOUTH DAILY. Patient taking differently: Take 25 mg by mouth daily. 12/31/21  Yes [name], MD  SYMBICORT 160-4.5 MCG/ACT inhaler INHALE 2 PUFFS INTO THE LUNGS IN THE MORNING AND AT BEDTIME. 10/01/22  Yes Burchette, Alinda Sierras, MD  torsemide (DEMADEX) 20 MG tablet TAKE 1 TABLET BY MOUTH EVERY DAY 07/20/22  Yes  Burchette, Alinda Sierras, MD  TRESIBA FLEXTOUCH 100 UNIT/ML FlexTouch Pen INJECT 10 UNITS INTO THE SKIN DAILY Patient taking differently: Inject 10 Units into the skin every evening. 03/08/22  Yes Burchette, Alinda Sierras, MD  BD PEN NEEDLE NANO 2ND GEN 32G X 4 MM MISC USE AS DIRECTED. 06/02/22   Burchette, Alinda Sierras, MD  blood glucose meter kit and supplies KIT Dispense based on patient and insurance preference. Use up to four times daily as directed. (FOR ICD-9 250.00, 250.01). 09/08/20   [place] Shi, MD  methylPREDNISolone (MEDROL DOSEPAK) 4 MG TBPK tablet follow package directions Patient not taking: Reported on 10/13/2022 09/24/22   Thurnell Lose, MD    Inpatient Medications: Scheduled Meds:  apixaban  2.5 mg Oral BID   clopidogrel  75 mg Oral Daily   docusate sodium  100 mg Oral BID   insulin aspart  0-15 Units Subcutaneous TID WC   insulin degludec  10 Units Subcutaneous QPM   mometasone-formoterol  2 puff Inhalation BID   nicotine  14 mg Transdermal Daily   pantoprazole  40 mg Oral Daily   rosuvastatin  20 mg Oral Daily   sodium chloride flush  3 mL Intravenous Q12H   spironolactone  25 mg Oral Daily   Continuous Infusions:  lactated ringers     PRN Meds:   Allergies:    Allergies  Allergen Reactions   Codeine Other (See Comments)    Makes him feel goofy   Morphine Other (See Comments)    Nightmares and felt bad    Social History:   Social History   Socioeconomic History   Marital status: Married    Spouse name: Not on file   Number of children: 2   Years of education: Not on file   Highest education level: Not on file  Occupational History    Employer: RETIRED    Comment: Retired  Tobacco Use   Smoking status: Former    Packs/day: 0.50    Years: 5.00  Total pack years: 2.50    Types: Cigarettes    Quit date: 07/20/1972    Years since quitting: 50.2   Smokeless tobacco: Never   Tobacco comments:    quit s  Vaping Use   Vaping Use: Never used  Substance  and Sexual Activity   Alcohol use: No   Drug use: No    Comment: quit smoking 40 years ago   Sexual activity: Not Currently  Other Topics Concern   Not on file  Social History Narrative   Retired   Married      Social Determinants of Health   Financial Resource Strain: [place]  (03/29/2022)   Overall Financial Resource Strain (CARDIA)    Difficulty of Paying Living Expenses: Not hard at all  Food Insecurity: No Food Insecurity (08/26/2022)   Hunger Vital Sign    Worried About Running Out of Food in the Last Year: Never true    Ran Out of Food in the Last Year: Never true  Transportation Needs: No Transportation Needs (08/26/2022)   PRAPARE - Hydrologist (Medical): No    Lack of Transportation (Non-Medical): No  Physical Activity: Inactive (03/29/2022)   Exercise Vital Sign    Days of Exercise per Week: 0 days    Minutes of Exercise per Session: 0 min  Stress: No Stress Concern Present (03/29/2022)   [place]    Feeling of Stress : Not at all  Social Connections: Socially Integrated (03/02/2021)   Social Connection and Isolation Panel [NHANES]    Frequency of Communication with Friends and Family: More than three times a week    Frequency of Social Gatherings with Friends and Family: More than three times a week    Attends Religious Services: More than 4 times per year    Active Member of [employer] or Organizations: Yes    Attends Archivist Meetings: 1 to 4 times per year    Marital Status: Married  Human resources officer Violence: Not At Risk (03/02/2021)   Humiliation, Afraid, Rape, and Kick questionnaire    Fear of Current or Ex-Partner: No    Emotionally Abused: No    Physically Abused: No    Sexually Abused: No    Family History:    Family History  Problem Relation Age of Onset   Cancer Mother        stomach   Heart attack Father      ROS:  Constitutional: Denied  fever, chills, malaise, night sweats Eyes: Denied vision change or loss Ears/Nose/Mouth/Throat: Denied ear ache, sore throat, coughing, sinus pain Cardiovascular: see HPI  Respiratory: see HPI Gastrointestinal: Denied nausea, vomiting, abdominal pain, diarrhea Genital/Urinary: Denied dysuria, hematuria, urinary frequency/urgency Musculoskeletal: Denied muscle ache, joint pain, weakness Skin: see HPI  Neuro: dizziness  Psych: Denied history of depression/anxiety  Endocrine: history of diabetes   Physical Exam/Data:   Vitals:   10/13/22 0630 10/13/22 0900 10/13/22 0930 10/13/22 1000  BP: 139/64 111/68 111/64 110/72  Pulse: (!) 59 70 95 82  Resp: _0 (!) 22  Temp:      SpO2: 96% 95% 97% 93%   No intake or output data in the 24 hours ending 10/13/22 1055    10/01/2022    3:27 PM 09/20/2022    8:39 AM 09/03/2022   10:08 AM  Last 3 Weights  Weight (lbs) 147 lb 9.6 oz 158 lb 11.7 oz 159 lb 3.2 oz  Weight (kg) 66.951 kg 72 kg 72.213 kg     There is no height or weight on file to calculate BMI.   Vitals:  Vitals:   10/13/22 0930 10/13/22 1000  BP: 111/64 110/72  Pulse: 95 82  Resp: 16 (!) 22  Temp:    SpO2: 97% 93%   General Appearance: In no apparent distress, laying in bed HEENT: Normocephalic, atraumatic.  Cardiovascular: Irregularly irregular, normal S1-S2,  no murmur Respiratory: Resting breathing unlabored, lungs sounds clear to auscultation bilaterally, no use of accessory muscles. On room air.  No wheezes, rales or rhonchi.   Gastrointestinal: Bowel sounds positive, abdomen soft, non-tender Extremities: Able to move all extremities in bed without difficulty, trace edema of BLE  Musculoskeletal: Normal muscle bulk and tone Skin: Intact, warm, dry. No rashes noted  Neurologic: Alert, oriented to person, place and time. No cognitive deficit Psychiatric: Normal affect. Mood is appropriate.     EKG:  The EKG was personally reviewed and demonstrates:    Atrial  fibrillation with ventricular rate of 72 bpm, PVC, old LBBB  Telemetry:  Telemetry was personally reviewed and demonstrates:    Atrial fibrillation with ventricular rate of 60-75s, occasional PVCs   Relevant CV Studies:  Event monitor from 05/28/2022:  Atrial fibrillation. Was tachy bradycardia rate but the average rate was 70. There was one 4.8-second pause There were relatively frequent runs of ventricular tachycardia with the longest being 15.5 seconds.   Echo from 05/28/22:  1. Left ventricular ejection fraction, by estimation, is 35 to 40%. The  left ventricle has moderately decreased function. The left ventricle  demonstrates global hypokinesis. There is mild concentric left ventricular  hypertrophy. Left ventricular  diastolic function could not be evaluated. There is moderate hypokinesis  of the left ventricular, basal-mid inferior wall. There is moderate  hypokinesis of the left ventricular, basal-mid anteroseptal wall and  inferoseptal wall. There is  disproportionately severe hypokinesis/akinesis of the basal mid anterior  septum with preserved apical contractility (suggests occluded native  artery with patent bypass to the distal LAD) and hypokinesis of the  inferior andinferoseptal walls.   2. Right ventricular systolic function is moderately reduced. The right  ventricular size is normal. There is moderately elevated pulmonary artery  systolic pressure. The estimated right ventricular systolic pressure is  76.1 mmHg.   3. Left atrial size was moderately dilated.   4. Right atrial size was moderately dilated.   5. The mitral valve is normal in structure. Mild to moderate mitral valve  regurgitation.   6. The aortic valve has been repaired/replaced. Aortic valve  regurgitation is mild. No aortic stenosis is present. There is a 29 mm  CoreValve-EvolutR prosthetic (TAVR) valve present in the aortic position.  Aortic valve mean gradient measures 2.0 mmHg.  Aortic  valve Vmax measures 0.96 m/s. Aortic valve acceleration time  measures 60 msec.   7. The inferior vena cava is normal in size with greater than 50%  respiratory variability, suggesting right atrial pressure of 3 mmHg.   Comparison(s): No significant change from prior study. Prior images  reviewed side by side. The left ventricular function is unchanged. The  left ventricular wall motion abnormalities are unchanged. Unchanged  prosthetic aortic valve function and mild  pulmonary HTN.    LHC on 08/19/21:    LM lesion is 25% stenosed.   Ost LM to Mid LM lesion is 50% stenosed.   Mid LAD lesion is 80% stenosed.   Mid Cx to Dist Cx lesion  is 80% stenosed.   Ost RCA to Prox RCA lesion is 100% stenosed.   Origin lesion is 40% stenosed.   RPDA lesion is 30% stenosed.   SVG to OM1 is patent. Dist Graft to Insertion lesion is 95% stenosed.   Balloon angioplasty was performed using a BALLN [place] EMERGE MR [id number].   Post intervention, there is a 30% residual stenosis.   LIMA to LAD is patent   SVG to PDA is patent   SVG to OM2 is patent   Severe triple vessel CAD s/p 4V CABG with 4/4 patent grafts Unable to selectively engage the left main artery due to obstruction of the ostium by the TAVR valve stent cage. Based on non-selective angiography the LAD and Circumflex are patent with anatomy likely unchanged from cath in 2022 but cannot definitively say this. All three grafts to the left system are patent.  Chronic occlusion proximal RCA Patent LIMA to LAD, SVG to OM1, SVG to OM2 and SVG to PDA Severe restenosis in the distal body of SVG to OM1 in the previously stented segment Successful PTCA with balloon angioplasty only of the restenosis within the distal body of the vein graft stented segment   Recommendations: I would plan to d/c home tomorrow with plans for Plavix and Eliquis for 6 months if possible. Stop ASA before discharge.    Laboratory Data:  High Sensitivity Troponin:   Recent Labs   Lab 09/20/22 0849 09/20/22 1302 10/13/22 0412 10/13/22 0645  TROPONINIHS 59* 54* 43* 45*     Chemistry Recent Labs  Lab 10/13/22 0535  NA 127*  K 5.3*  CL 96*  CO2 21*  GLUCOSE 118*  BUN 50*  CREATININE 2.39*  CALCIUM 8.9  MG 2.1  GFRNONAA 26*  ANIONGAP 10    No results for input(s): "PROT", "ALBUMIN", "AST", "ALT", "ALKPHOS", "BILITOT" in the last 168 hours. Lipids No results for input(s): "CHOL", "TRIG", "HDL", "LABVLDL", "LDLCALC", "CHOLHDL" in the last 168 hours.  Hematology Recent Labs  Lab 10/13/22 0412  WBC 9.1  RBC 4.39  HGB 12.1*  HCT 35.4*  MCV 80.6  MCH 27.6  MCHC 34.2  RDW 18.1*  PLT 79*   Thyroid  Recent Labs  Lab 10/13/22 0412 10/13/22 0503  TSH 5.697*  --   FREET4  --  1.16*    BNPNo results for input(s): "BNP", "PROBNP" in the last 168 hours.  DDimer No results for input(s): "DDIMER" in the last 168 hours.   Radiology/Studies:  DG Chest Portable 1 View  Result Date: 10/13/2022 CLINICAL DATA:  88 year old male with history of shortness of breath. EXAM: PORTABLE CHEST 1 VIEW COMPARISON:  Chest x-ray 10/01/2022. FINDINGS: Lung volumes are low. Ill-defined airspace consolidation in the periphery of the lower right lung. Probable areas of subsegmental atelectasis in the base of the left lung. Mild blunting of the costophrenic sulci bilaterally suggesting trace bilateral pleural effusions. No pneumothorax. Cardiomegaly with cephalization of the pulmonary vasculature, but no frank pulmonary edema. Upper mediastinal contours are within normal limits. Atherosclerotic calcifications in the thoracic aorta. Status post median sternotomy for CABG. Patient is also status post TAVR. IMPRESSION: 1. New area of ill-defined airspace consolidation in the periphery of the right lung base either in the right middle or right lower lobe, concerning for developing pneumonia. Followup PA and lateral chest X-ray is recommended in 3-4 weeks following trial of antibiotic  therapy to ensure resolution and exclude underlying malignancy. 2. Additional left basilar subsegmental atelectasis or scarring. 3. Trace bilateral  pleural effusions. 4. Cardiomegaly with pulmonary venous congestion, but no frank pulmonary edema. 5. Aortic atherosclerosis. Electronically Signed   By: Vinnie Langton M.D.   On: 10/13/2022 05:58     Assessment and Plan:   Weakness and dizziness History of atrial fibrillation with slowed ventricular response History of NSVT and sinus pause -Presented with weakness, dizziness, respiratory symptoms for 6 weeks, reportedly bradycardic with heart rate of 20-30s by EMS that improved with atropine 59m in the field  -Unclear symptomatic bradycardia is the etiology, AKI/dehydration/metabolic derangement including hyponatremia, hyperkalemia, elevated TSH should be corrected first before PPM evaluation, consider other etiologies such as CVA, vestibular dysfunction, orthostatic hypotension -Carvedilol has been held by primary team, reasonable for now, although if reversible cause corrected, use of beta-blocker may outweighs the risk given his cardiac comorbidity   CAD with history of CABG and PCI's Elevated troponin -Patient reports ongoing midsternal chest pain with exertional activity that has been happening more frequently at home, unclear if it is different than his chronic chest pain -High sensitive troponin flat 43 x1, may trend 2nd  -EKG without acute ischemic change noted -Suspecting symptom is associated with ongoing respiratory illness, will need recovery of AKI and pneumonia before further ischemic evaluation if indicated -Medical therapy: Continue PTA Crestor, Plavix, Eliquis, and amlodipine; may add Imdur for angina ; carvedilol held due to bradycardia  Chronic combined CHF -Last echocardiogram from 05/18/2022 LVEF 35 to 40%, global hypokinesis, mild concentric LVH, moderately reduced RV with RVSP 45.5 mmHg, mod LAE and RAE, mild to moderate MR,  and mild AI, no AS, mean gradient 2 mmHg, aortic valve Vmax measures 0.96 m/s  -CXR revealed new evidence of pneumonia, no significant pulmonary edema -Suspecting shortness of breath and cough are due to underlying infection, clinically appears hypovolemic -Will check BNP and lactic acid  - GDMT: Hold carvedilol due to episodes of bradycardia, hold torsemide and spironolactone due to AKI; historically not tolerating Jardiance -Will repeat echocardiogram today for further evaluation  History of aortic stenosis status post TAVR -Echocardiogram from 05/18/2022 with LVEF 35 to 40%, mild AI, mean gradient 2 mmHg, aortic valve Vmax measures 0.96 m/s  -Repeat echo  Hypertension -BP is well-controlled at this time  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 The patient's score is based upon: CHF History: 1 HTN History: 1 Diabetes History: 1 Stroke History: 0 Vascular Disease History: 1 Age Score: 2 Gender Score: 0    For questions or updates, please contact CLake QuiviraPlease consult [email or web address] for contact info under    Signed, XMargie Billet NP  10/13/2022 10:55 AM

## 2022-10-14 ENCOUNTER — Observation Stay (HOSPITAL_COMMUNITY): Payer: Medicare PPO

## 2022-10-14 DIAGNOSIS — I5021 Acute systolic (congestive) heart failure: Secondary | ICD-10-CM | POA: Diagnosis not present

## 2022-10-14 DIAGNOSIS — L89312 Pressure ulcer of right buttock, stage 2: Secondary | ICD-10-CM | POA: Diagnosis present

## 2022-10-14 DIAGNOSIS — R531 Weakness: Secondary | ICD-10-CM | POA: Diagnosis not present

## 2022-10-14 DIAGNOSIS — R001 Bradycardia, unspecified: Secondary | ICD-10-CM | POA: Diagnosis present

## 2022-10-14 DIAGNOSIS — D649 Anemia, unspecified: Secondary | ICD-10-CM | POA: Diagnosis present

## 2022-10-14 DIAGNOSIS — E871 Hypo-osmolality and hyponatremia: Secondary | ICD-10-CM

## 2022-10-14 DIAGNOSIS — Z66 Do not resuscitate: Secondary | ICD-10-CM | POA: Diagnosis present

## 2022-10-14 DIAGNOSIS — F05 Delirium due to known physiological condition: Secondary | ICD-10-CM | POA: Diagnosis not present

## 2022-10-14 DIAGNOSIS — J101 Influenza due to other identified influenza virus with other respiratory manifestations: Secondary | ICD-10-CM | POA: Diagnosis not present

## 2022-10-14 DIAGNOSIS — Z79899 Other long term (current) drug therapy: Secondary | ICD-10-CM | POA: Diagnosis not present

## 2022-10-14 DIAGNOSIS — I429 Cardiomyopathy, unspecified: Secondary | ICD-10-CM | POA: Diagnosis present

## 2022-10-14 DIAGNOSIS — E7800 Pure hypercholesterolemia, unspecified: Secondary | ICD-10-CM | POA: Diagnosis present

## 2022-10-14 DIAGNOSIS — N1832 Chronic kidney disease, stage 3b: Secondary | ICD-10-CM | POA: Diagnosis present

## 2022-10-14 DIAGNOSIS — R627 Adult failure to thrive: Secondary | ICD-10-CM | POA: Diagnosis present

## 2022-10-14 DIAGNOSIS — J9811 Atelectasis: Secondary | ICD-10-CM | POA: Diagnosis not present

## 2022-10-14 DIAGNOSIS — I272 Pulmonary hypertension, unspecified: Secondary | ICD-10-CM | POA: Diagnosis present

## 2022-10-14 DIAGNOSIS — E1169 Type 2 diabetes mellitus with other specified complication: Secondary | ICD-10-CM | POA: Diagnosis present

## 2022-10-14 DIAGNOSIS — I472 Ventricular tachycardia, unspecified: Secondary | ICD-10-CM | POA: Diagnosis not present

## 2022-10-14 DIAGNOSIS — I130 Hypertensive heart and chronic kidney disease with heart failure and stage 1 through stage 4 chronic kidney disease, or unspecified chronic kidney disease: Secondary | ICD-10-CM | POA: Diagnosis present

## 2022-10-14 DIAGNOSIS — Z8616 Personal history of COVID-19: Secondary | ICD-10-CM | POA: Diagnosis not present

## 2022-10-14 DIAGNOSIS — I5022 Chronic systolic (congestive) heart failure: Secondary | ICD-10-CM | POA: Diagnosis not present

## 2022-10-14 DIAGNOSIS — E1122 Type 2 diabetes mellitus with diabetic chronic kidney disease: Secondary | ICD-10-CM | POA: Diagnosis present

## 2022-10-14 DIAGNOSIS — I5023 Acute on chronic systolic (congestive) heart failure: Secondary | ICD-10-CM | POA: Diagnosis present

## 2022-10-14 DIAGNOSIS — E861 Hypovolemia: Secondary | ICD-10-CM | POA: Diagnosis present

## 2022-10-14 DIAGNOSIS — Z515 Encounter for palliative care: Secondary | ICD-10-CM | POA: Diagnosis not present

## 2022-10-14 DIAGNOSIS — I4819 Other persistent atrial fibrillation: Secondary | ICD-10-CM | POA: Diagnosis not present

## 2022-10-14 DIAGNOSIS — J189 Pneumonia, unspecified organism: Secondary | ICD-10-CM | POA: Diagnosis not present

## 2022-10-14 DIAGNOSIS — D6959 Other secondary thrombocytopenia: Secondary | ICD-10-CM | POA: Diagnosis present

## 2022-10-14 DIAGNOSIS — R310 Gross hematuria: Secondary | ICD-10-CM | POA: Insufficient documentation

## 2022-10-14 DIAGNOSIS — Z952 Presence of prosthetic heart valve: Secondary | ICD-10-CM | POA: Diagnosis not present

## 2022-10-14 DIAGNOSIS — J440 Chronic obstructive pulmonary disease with acute lower respiratory infection: Secondary | ICD-10-CM | POA: Diagnosis present

## 2022-10-14 DIAGNOSIS — I25118 Atherosclerotic heart disease of native coronary artery with other forms of angina pectoris: Secondary | ICD-10-CM | POA: Diagnosis not present

## 2022-10-14 DIAGNOSIS — Z7189 Other specified counseling: Secondary | ICD-10-CM | POA: Diagnosis not present

## 2022-10-14 DIAGNOSIS — J1008 Influenza due to other identified influenza virus with other specified pneumonia: Secondary | ICD-10-CM | POA: Diagnosis present

## 2022-10-14 DIAGNOSIS — E875 Hyperkalemia: Secondary | ICD-10-CM | POA: Diagnosis present

## 2022-10-14 DIAGNOSIS — D696 Thrombocytopenia, unspecified: Secondary | ICD-10-CM | POA: Insufficient documentation

## 2022-10-14 DIAGNOSIS — J90 Pleural effusion, not elsewhere classified: Secondary | ICD-10-CM | POA: Diagnosis not present

## 2022-10-14 DIAGNOSIS — L89322 Pressure ulcer of left buttock, stage 2: Secondary | ICD-10-CM | POA: Diagnosis present

## 2022-10-14 DIAGNOSIS — J439 Emphysema, unspecified: Secondary | ICD-10-CM | POA: Diagnosis not present

## 2022-10-14 LAB — URINALYSIS, ROUTINE W REFLEX MICROSCOPIC
Bacteria, UA: NONE SEEN
Bilirubin Urine: NEGATIVE
Glucose, UA: NEGATIVE mg/dL
Ketones, ur: NEGATIVE mg/dL
Leukocytes,Ua: NEGATIVE
Nitrite: NEGATIVE
Protein, ur: NEGATIVE mg/dL
Specific Gravity, Urine: 1.008 (ref 1.005–1.030)
pH: 6 (ref 5.0–8.0)

## 2022-10-14 LAB — GLUCOSE, CAPILLARY
Glucose-Capillary: 117 mg/dL — ABNORMAL HIGH (ref 70–99)
Glucose-Capillary: 126 mg/dL — ABNORMAL HIGH (ref 70–99)
Glucose-Capillary: 180 mg/dL — ABNORMAL HIGH (ref 70–99)
Glucose-Capillary: 81 mg/dL (ref 70–99)
Glucose-Capillary: 89 mg/dL (ref 70–99)

## 2022-10-14 LAB — BASIC METABOLIC PANEL
Anion gap: 9 (ref 5–15)
BUN: 34 mg/dL — ABNORMAL HIGH (ref 8–23)
CO2: 18 mmol/L — ABNORMAL LOW (ref 22–32)
Calcium: 9 mg/dL (ref 8.9–10.3)
Chloride: 99 mmol/L (ref 98–111)
Creatinine, Ser: 1.79 mg/dL — ABNORMAL HIGH (ref 0.61–1.24)
GFR, Estimated: 36 mL/min — ABNORMAL LOW (ref >60)
Glucose, Bld: 81 mg/dL (ref 70–99)
Potassium: 4.7 mmol/L (ref 3.5–5.1)
Sodium: 126 mmol/L — ABNORMAL LOW (ref 135–145)

## 2022-10-14 LAB — CBC
HCT: 35.6 % — ABNORMAL LOW (ref 39.0–52.0)
Hemoglobin: 11.6 g/dL — ABNORMAL LOW (ref 13.0–17.0)
MCH: 26.7 pg (ref 26.0–34.0)
MCHC: 32.6 g/dL (ref 30.0–36.0)
MCV: 81.8 fL (ref 80.0–100.0)
Platelets: 75 10*3/uL — ABNORMAL LOW (ref 150–400)
RBC: 4.35 MIL/uL (ref 4.22–5.81)
RDW: 18 % — ABNORMAL HIGH (ref 11.5–15.5)
WBC: 10.2 10*3/uL (ref 4.0–10.5)
nRBC: 0 % (ref 0.0–0.2)

## 2022-10-14 LAB — SODIUM, URINE, RANDOM: Sodium, Ur: 82 mmol/L

## 2022-10-14 MED ORDER — RENA-VITE PO TABS
1.0000 | ORAL_TABLET | Freq: Every day | ORAL | Status: DC
  Administered 2022-10-14 – 2022-10-20 (×7): 1 via ORAL
  Filled 2022-10-14 (×7): qty 1

## 2022-10-14 MED ORDER — OSELTAMIVIR PHOSPHATE 30 MG PO CAPS
30.0000 mg | ORAL_CAPSULE | Freq: Every day | ORAL | Status: AC
  Administered 2022-10-14 – 2022-10-18 (×5): 30 mg via ORAL
  Filled 2022-10-14 (×5): qty 1

## 2022-10-14 MED ORDER — MELATONIN 5 MG PO TABS
5.0000 mg | ORAL_TABLET | Freq: Every evening | ORAL | Status: DC | PRN
  Administered 2022-10-14 – 2022-10-24 (×7): 5 mg via ORAL
  Filled 2022-10-14 (×8): qty 1

## 2022-10-14 MED ORDER — FUROSEMIDE 10 MG/ML IJ SOLN
60.0000 mg | Freq: Two times a day (BID) | INTRAMUSCULAR | Status: DC
  Administered 2022-10-14 – 2022-10-15 (×4): 60 mg via INTRAVENOUS
  Filled 2022-10-14 (×4): qty 6

## 2022-10-14 MED ORDER — GLUCERNA SHAKE PO LIQD
237.0000 mL | Freq: Three times a day (TID) | ORAL | Status: DC
  Administered 2022-10-14 – 2022-10-24 (×26): 237 mL via ORAL

## 2022-10-14 NOTE — Progress Notes (Signed)
Initial Nutrition Assessment  DOCUMENTATION CODES:   Not applicable  INTERVENTION:  - Add Glucerna Shake po TID, each supplement provides 220 kcal and 10 grams of protein  - Add Renal MVI q day.   NUTRITION DIAGNOSIS:   Increased nutrient needs related to acute illness as evidenced by estimated needs.  GOAL:   Patient will meet greater than or equal to 90% of their needs  MONITOR:   PO intake, Supplement acceptance  REASON FOR ASSESSMENT:   Consult Assessment of nutrition requirement/status  ASSESSMENT:   88 y.o. male admits related to weakness. PMH includes: CHF, DM, HTN, HLD, afib, and pancreatic cancer. Pt is currently receiving medical management for Flu A.  Meds reviewed: colace, lasix, sliding scale insulin, semglee (10 units), aldactone. Labs reviewed: Na low, BUN/Creatinine elevated.   Pt out of room at time of assessment. Pt is currently on a soft diet. RN reports that the pt did well with his breakfast this am. Per record, pt also ate 100% of his dinner last night. RD will add Glucerna with meals. Will attempt to gather further nutrition hx at f/u.   NUTRITION - FOCUSED PHYSICAL EXAM:  Unable to assess, attempt at f/u.  Diet Order:   Diet Order             DIET SOFT Room service appropriate? Yes; Fluid consistency: Thin  Diet effective now                   EDUCATION NEEDS:   Not appropriate for education at this time  Skin:  Skin Assessment: Reviewed RN Assessment  Last BM:  10/14/22  Height:   Ht Readings from Last 1 Encounters:  10/13/22 '5\' 9"'$  (1.753 m)    Weight:   Wt Readings from Last 1 Encounters:  10/13/22 63.7 kg    Ideal Body Weight:     BMI:  Body mass index is 20.74 kg/m.  Estimated Nutritional Needs:   Kcal:  1590-1910 kcals  Protein:  80-95 gm  Fluid:  >/= 1.5 L   Thalia Bloodgood, RD, LDN, CNSC.

## 2022-10-14 NOTE — TOC Initial Note (Signed)
Transition of Care [facility]) - Initial/Assessment Note    Patient Details  Name: [name] MRN: [id number] Date of Birth: 1933-07-25  [facility] CM/SW Contact:    Beckey Rutter, MSW, [place] Phone Number: 10/14/2022, 12:13 PM  Clinical Narrative:  CSW met with pt at bedside along with family. CSW notified pt that therapy is recommending SNF. Pt was hesistant at first but agrreed to go to SNF to get better. Pt family is unfamiliar with facilities in [place] to go to. CSW completed fl2 and faxed out to several facilities in [place].                  Expected Discharge Plan: Skilled Nursing Facility Barriers to Discharge: Continued Medical Work up   Patient Goals and CMS Choice            Expected Discharge Plan and Services       Living arrangements for the past 2 months: Single Family Home                                      Prior Living Arrangements/Services Living arrangements for the past 2 months: Single Family Home Lives with:: Spouse Patient language and need for interpreter reviewed:: Yes        Need for Family Participation in Patient Care: Yes (Comment) Care giver support system in place?: Yes (comment)   Criminal Activity/Legal Involvement Pertinent to Current Situation/Hospitalization: No - Comment as needed  Activities of Daily Living      Permission Sought/Granted Permission sought to share information with : Family Supports                Emotional Assessment Appearance:: Appears stated age Attitude/Demeanor/Rapport: Engaged, Gracious Affect (typically observed): Accepting, Afraid/Fearful Orientation: : Oriented to Self, Oriented to  Time, Oriented to Place, Oriented to Situation   Psych Involvement: No (comment)  Admission diagnosis:  Bradycardia [R00.1] Sinus pause [I45.5] Generalized weakness [R53.1] Community acquired pneumonia of right middle lobe of lung [J18.9] Patient Active Problem List    Diagnosis Date Noted   Thrombocytopenia ([place]) 10/14/2022   Gross hematuria 10/14/2022   Symptomatic bradycardia 10/13/2022   Influenza A 10/13/2022   Pneumonia due to COVID-19 virus 09/20/2022   Hyperkalemia 09/20/2022   Acute kidney injury superimposed on chronic kidney disease ([place]) 09/20/2022   Hyponatremia 09/20/2022   Leukocytosis 09/20/2022   Stage 3b chronic kidney disease (CKD) ([place]) 02/06/2022   Type 2 diabetes mellitus with hyperlipidemia (HCC)    Anemia    Atrial fibrillation, chronic (HCC)    Acute on chronic systolic CHF (congestive heart failure) ([place]) 02/05/2022   Depression, recurrent ([place]) 09/30/2021   NSTEMI (non-ST elevated myocardial infarction) ([place])    S/P TAVR (transcatheter aortic valve replacement) 08/18/2021   Unstable angina ([place]) 08/18/2021   Severe aortic stenosis 04/29/2021   Aortic stenosis, severe 04/27/2021   Angina at rest 09/20/2020   Chronic diastolic (congestive) heart failure ([place]) 09/20/2020   COPD with acute bronchitis ([place]) 09/08/2020   Acute exacerbation of CHF (congestive heart failure) ([place]) 09/07/2020   COPD mixed type ([place]) 91/63/8466   Chronic systolic CHF (congestive heart failure) ([place]) 12/26/2019   Educated about COVID-19 virus infection 10/23/2019   Persistent atrial fibrillation ([place]) 08/29/2019   Nonrheumatic aortic valve stenosis 08/29/2019   Elevated troponin 07/27/2019   HLD (hyperlipidemia) 07/27/2019   GERD (gastroesophageal reflux disease) 07/27/2019  Congestive heart failure (CHF) ([place]) 07/26/2019   Pure hypercholesterolemia    Acute on chronic diastolic CHF (congestive heart failure) (HCC)    SOB (shortness of breath) 06/04/2019   CKD (chronic kidney disease), stage III ([place]) 06/04/2019   Coronary artery disease of native artery of native heart with stable angina pectoris ([place]) 03/24/2018   Bilateral carotid artery stenosis 07/23/2017   Palpitation 07/23/2017   Urinary urgency 07/28/2016   PAF (paroxysmal atrial  fibrillation) (HCC)    DOE (dyspnea on exertion) 07/17/2014   Acute on chronic renal insufficiency- ACE and diuretics held 93/96/8864   Diastolic dysfunction- moderate on echo 2013 07/17/2014   Aortic stenosis 07/17/2014   LBBB (left bundle branch block) 07/17/2014   Chest pain 07/17/2014   Crescendo angina ([place]) 07/16/2014   CKD (chronic kidney disease) stage 4, GFR 15-29 ml/min ([place]) 12/03/2013   Squamous cell cancer of skin of hand 05/31/2013   HEAD TRAUMA, CLOSED 09/24/2010   Ischemic cardiomyopathy- last EF Normal Myoview Jan 2015 04/02/2010   INSOMNIA 04/02/2010   Moderate bilateral ICA disease by doppler June 2015 01/08/2010   DYSPHAGIA UNSPECIFIED 09/05/2009   Hx GERD/PUD 09/03/2009   CHOLELITHIASIS, WITH CHOLECYSTITIS 09/03/2009   ARTHRITIS 07/16/2009   HYPOKALEMIA 11/06/2008   CAD in native artery 11/06/2008   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 ([place]) 11/23/2007   Dyslipidemia 11/23/2007   ROSACEA 11/23/2007   DYSPNEA ON EXERTION 11/23/2007   UNS ADVRS EFF OTH RX MEDICINAL&BIOLOGICAL SBSTNC 11/23/2007   Hypertension 11/22/2007   Hx of Prostate ca 03/01/2004   PCP:  [name], MD Pharmacy:   [id number] [place], [id number]: [phone]: [phone]    Social Determinants of Health (SDOH) Social History: SDOH Screenings   Food Insecurity: No Food Insecurity (08/26/2022)  Housing: Low Risk  (03/02/2021)  Transportation Needs: No Transportation Needs (08/26/2022)  Alcohol Screen: Low Risk  (03/02/2021)  Depression (PHQ2-9): High Risk (10/01/2022)  Financial Resource Strain: Low Risk  (03/29/2022)  Physical Activity: Inactive (03/29/2022)  Social Connections: Socially Integrated (03/02/2021)  Stress: No Stress Concern Present (03/29/2022)  Tobacco Use: Medium Risk (10/13/2022)   SDOH Interventions:     Readmission Risk Interventions    02/08/2022   10:44  AM  Readmission Risk Prevention Plan  Transportation Screening Complete  PCP or Specialist Appt within 3-5 Days Complete  [place] or [place]  Social Work Consult for [place]chard[place]anning/Counseling Complete  Palliative Care Screening Not Applicable  Medication Review (Press photographer Complete   JBeckey Rutter MSW, LCSWA, LCASA Transitions of Care  Clinical Social Worker I

## 2022-10-14 NOTE — Assessment & Plan Note (Addendum)
Volume status hard to judge.  Unclear if he is symptomatic from low sodium or age and influenza and renal failure.   We think this is multifactorial with SIADH, cardiorenal syndrome contributing.  His oral intake is minimal, so free water restriction is not useful. - Check cortisol - Continue salt tabs (started Monday) for water excretion - Hold SSRI - Trend Na  - Cardiology and I discussed milrinone and tolvaptan; we will place PICC today and Dr. Antoine Poche will direct one or the other to try to improve his serum sodium over the next few days

## 2022-10-14 NOTE — Progress Notes (Signed)
Physical Therapy Evaluation Patient Details Name: [name] MRN: [id number] DOB: 1932/11/03 Today's Date: 10/14/2022  History of Present Illness  Patient 88 y.o. who presented pm 12/27 with weakness and bradycardia, + flu. Pt with recent  PNA, RSV and COVID 19.PMH includes HTN, HLD, PAF on Eliquis, CHF, CAD s/p CABG, DM type II, prostate cancer.  Clinical Impression  Pt was seen for initial test of mobility, has good tolerance to move to side of bed in motivated effort given he had forgotten his cath was in place.  After, reclined on R side on elevated HOB, and declined to try to stand.  Very limited effort with SOB complaints but sats were in 97% range.  Noted his family were all in visiting, and wife encouraged him to try to do more.  Given his limited tolerances for mobility, his good baseline function and his expectations of going home with more independence eventually, recommend him to SNF to restore endurance and balance for better gait safety and fall avoidance.  Family is supportive of this plan, so will focus efforts acutely on this preparation to finish rehab as quickly as possible.       Recommendations for follow up therapy are one component of a multi-disciplinary discharge planning process, led by the attending physician.  Recommendations may be updated based on patient status, additional functional criteria and insurance authorization.  Follow Up Recommendations Skilled nursing-short term rehab (<3 hours/day) Can patient physically be transported by private vehicle: No    Assistance Recommended at Discharge Frequent or constant Supervision/Assistance  Patient can return home with the following  A lot of help with walking and/or transfers;A lot of help with bathing/dressing/bathroom;Assistance with cooking/housework;Direct supervision/assist for medications management;Direct supervision/assist for financial management;Assist for transportation;Help with stairs or ramp for  entrance    Equipment Recommendations None recommended by PT  Recommendations for Other Services       Functional Status Assessment Patient has had a recent decline in their functional status and demonstrates the ability to make significant improvements in function in a reasonable and predictable amount of time.     Precautions / Restrictions Precautions Precautions: Fall Restrictions Weight Bearing Restrictions: No      Mobility  Bed Mobility Overal bed mobility: Needs Assistance Bed Mobility: Supine to Sit, Sit to Supine     Supine to sit: Min guard Sit to supine: Min assist   General bed mobility comments: pt was motivated to sit up as he forgot he was wearing a catheter and thought he had to get OOB for use of urinal    Transfers Overall transfer level: Needs assistance Equipment used: 1 person hand held assist Transfers: Bed to chair/wheelchair/BSC            Lateral/Scoot Transfers: Mod assist General transfer comment: pt is unwilling to stand again after having done so much to get to side of bed, SOB but sats were 97%    Ambulation/Gait               General Gait Details: unable to attempt  Stairs            Wheelchair Mobility    Modified Rankin (Stroke Patients Only)       Balance Overall balance assessment: Needs assistance Sitting-balance support: Feet supported Sitting balance-Leahy Scale: Fair         Standing balance comment: declined to try  Pertinent Vitals/Pain Pain Assessment Pain Assessment: No/denies pain    Home Living Family/patient expects to be discharged to:: Skilled nursing facility Living Arrangements: Spouse/significant other Available Help at Discharge: Family;Available 24 hours/day Type of Home: House Home Access: Stairs to enter Entrance Stairs-Rails: None Entrance Stairs-Number of Steps: 2 in the front vs none in the back   Home Layout: Two level;Able to  live on main level with bedroom/bathroom Home Equipment: Kasandra Knudsen - single point;Rollator (4 wheels) Additional Comments: Wife and son are visiting unmasked    Prior Function Prior Level of Function : Independent/Modified Independent             Mobility Comments: SPC to walk at home ADLs Comments: Wife helps with dressing recently, no longer drives, and does not participate with iADL     Hand Dominance   Dominant Hand: Right    Extremity/Trunk Assessment   Upper Extremity Assessment Upper Extremity Assessment: Defer to OT evaluation    Lower Extremity Assessment Lower Extremity Assessment: Generalized weakness (stiffness in LE's)    Cervical / Trunk Assessment Cervical / Trunk Assessment: Kyphotic  Communication   Communication: HOH  Cognition Arousal/Alertness: Awake/alert Behavior During Therapy: WFL for tasks assessed/performed Overall Cognitive Status: Impaired/Different from baseline Area of Impairment: Memory, Following commands, Awareness, Problem solving                 Orientation Level: Time, Situation   Memory: Decreased short-term memory Following Commands: Follows one step commands inconsistently, Follows one step commands with increased time   Awareness: Intellectual Problem Solving: Slow processing, Requires verbal cues, Requires tactile cues, Decreased initiation General Comments: pt is not able to give all his history, as well as asking wife to answer for him        General Comments General comments (skin integrity, edema, etc.): pt is on bed with bedrail use and HOB elevated after trying to get OOB alone.  In room with wife who is trying to calm him.    Exercises     Assessment/Plan    PT Assessment Patient needs continued PT services  PT Problem List Decreased strength;Decreased range of motion;Decreased activity tolerance;Decreased balance;Decreased mobility;Decreased coordination;Cardiopulmonary status limiting activity       PT  Treatment Interventions DME instruction;Gait training;Stair training;Functional mobility training;Therapeutic activities;Therapeutic exercise;Balance training;Patient/family education    PT Goals (Current goals can be found in the Care Plan section)  Acute Rehab PT Goals Patient Stated Goal: get to breathe better and get home PT Goal Formulation: With patient/family Time For Goal Achievement: 10/21/22 Potential to Achieve Goals: Good    Frequency Min 3X/week     Co-evaluation               AM-PAC PT "6 Clicks" Mobility  Outcome Measure Help needed turning from your back to your side while in a flat bed without using bedrails?: A Little Help needed moving from lying on your back to sitting on the side of a flat bed without using bedrails?: A Little Help needed moving to and from a bed to a chair (including a wheelchair)?: A Little Help needed standing up from a chair using your arms (e.g., wheelchair or bedside chair)?: A Lot Help needed to walk in hospital room?: A Lot Help needed climbing 3-5 steps with a railing? : Total 6 Click Score: 14    End of Session Equipment Utilized During Treatment: Gait belt Activity Tolerance: Patient limited by fatigue;Treatment limited secondary to medical complications (Comment) Patient left: in bed;with call bell/phone  within reach;with bed alarm set;with family/visitor present Nurse Communication: Mobility status PT Visit Diagnosis: Muscle weakness (generalized) (M62.81);Difficulty in walking, not elsewhere classified (R26.2);Unsteadiness on feet (R26.81)    Time: 0034-9611 PT Time Calculation (min) (ACUTE ONLY): 31 min   Charges:   PT Evaluation $PT Eval Moderate Complexity: 1 Mod PT Treatments $Therapeutic Activity: 8-22 mins       Ramond Dial 10/14/2022, 12:41 PM  Mee Hives, PT PhD Acute Rehab Dept. Number: [place] [facility] [place]

## 2022-10-14 NOTE — Assessment & Plan Note (Signed)
Continue Crestor 

## 2022-10-14 NOTE — Assessment & Plan Note (Addendum)
Cardiology were consulted, BB stopped.  No further work up recommended. - Stop Coreg

## 2022-10-14 NOTE — Assessment & Plan Note (Addendum)
Glucose controlled - Continue glargine - Continue sliding scale corrections - Hold Januvia

## 2022-10-14 NOTE — Assessment & Plan Note (Signed)
Rate controlled - Continue Eliquis - Hold carvedilol

## 2022-10-14 NOTE — Assessment & Plan Note (Addendum)
No pitting edema or JVD but slightly more thirdspacing today. - Hold diuretics for now

## 2022-10-14 NOTE — Hospital Course (Addendum)
[name] is an 88 y.o. M with pAF on Eliquis, CAD s/p CABG and multiple PCI last >40yr DM, HTN, HLD, sdCHF EF now 25-30%, CKD IIIb baseline 1.7-2.0, COVID earlier this month, and prosCA who presented with weakness and fatigue.  In the ER, influenza A positive.   12/27: Admitted on Tamiflu, Cards consulted 12/28: CT chest ordered 12/29-12/31: Clinically improving 1/1: More delirious

## 2022-10-14 NOTE — Assessment & Plan Note (Addendum)
UA and culture negative. Observed by family not nursing.

## 2022-10-14 NOTE — Assessment & Plan Note (Addendum)
Platelets down to 75,000, due to influenza.  Now normalized.

## 2022-10-14 NOTE — Assessment & Plan Note (Addendum)
Hemoglobin stable 

## 2022-10-14 NOTE — NC FL2 (Signed)
[place] LEVEL OF CARE FORM     IDENTIFICATION  Patient Name: [name] Birthdate: 1933/07/29 Sex: male Admission Date (Current Location): 10/13/2022  [facility] and [place] Number:  Herbalist and Address:  [facility] [place]. [facility], [place] [address] [place], [place] [address]      Provider Number: [id number]  Attending Physician Name and Address:  Edwin Dada, *  Relative Name and Phone Number:       Current Level of Care: Hospital Recommended Level of Care: [place] Prior Approval Number:    Date Approved/Denied:   PASRR Number: [id number]  Discharge Plan: SNF    Current Diagnoses: Patient Active Problem List   Diagnosis Date Noted   Thrombocytopenia ([place]) 10/14/2022   Gross hematuria 10/14/2022   Symptomatic bradycardia 10/13/2022   Influenza A 10/13/2022   Pneumonia due to COVID-19 virus 09/20/2022   Hyperkalemia 09/20/2022   Acute kidney injury superimposed on chronic kidney disease ([place]) 09/20/2022   Hyponatremia 09/20/2022   Leukocytosis 09/20/2022   Stage 3b chronic kidney disease (CKD) ([place]) 02/06/2022   Type 2 diabetes mellitus with hyperlipidemia ([place])    Anemia    Atrial fibrillation, chronic (HCC)    Acute on chronic systolic CHF (congestive heart failure) ([place]) 02/05/2022   Depression, recurrent ([place]) 09/30/2021   NSTEMI (non-ST elevated myocardial infarction) ([place])    S/P TAVR (transcatheter aortic valve replacement) 08/18/2021   Unstable angina ([place]) 08/18/2021   Severe aortic stenosis 04/29/2021   Aortic stenosis, severe 04/27/2021   Angina at rest 09/20/2020   Chronic diastolic (congestive) heart failure ([place]) 09/20/2020   COPD with acute bronchitis ([place]) 09/08/2020   Acute exacerbation of CHF (congestive heart failure) ([place]) 09/07/2020   COPD mixed type ([place]) 48/10/6551   Chronic systolic CHF (congestive heart failure) ([place]) 12/26/2019   Educated about COVID-19 virus infection  10/23/2019   Persistent atrial fibrillation ([place]) 08/29/2019   Nonrheumatic aortic valve stenosis 08/29/2019   Elevated troponin 07/27/2019   HLD (hyperlipidemia) 07/27/2019   GERD (gastroesophageal reflux disease) 07/27/2019   Congestive heart failure (CHF) ([place]) 07/26/2019   Pure hypercholesterolemia    Acute on chronic diastolic CHF (congestive heart failure) (HCC)    SOB (shortness of breath) 06/04/2019   CKD (chronic kidney disease), stage III ([place]) 06/04/2019   Coronary artery disease of native artery of native heart with stable angina pectoris ([place]) 03/24/2018   Bilateral carotid artery stenosis 07/23/2017   Palpitation 07/23/2017   Urinary urgency 07/28/2016   PAF (paroxysmal atrial fibrillation) (HCC)    DOE (dyspnea on exertion) 07/17/2014   Acute on chronic renal insufficiency- ACE and diuretics held 74/82/7078   Diastolic dysfunction- moderate on echo 2013 07/17/2014   Aortic stenosis 07/17/2014   LBBB (left bundle branch block) 07/17/2014   Chest pain 07/17/2014   Crescendo angina ([place]) 07/16/2014   CKD (chronic kidney disease) stage 4, GFR 15-29 ml/min ([place]) 12/03/2013   Squamous cell cancer of skin of hand 05/31/2013   HEAD TRAUMA, CLOSED 09/24/2010   Ischemic cardiomyopathy- last EF Normal Myoview Jan 2015 04/02/2010   INSOMNIA 04/02/2010   Moderate bilateral ICA disease by doppler June 2015 01/08/2010   DYSPHAGIA UNSPECIFIED 09/05/2009   Hx GERD/PUD 09/03/2009   CHOLELITHIASIS, WITH CHOLECYSTITIS 09/03/2009   ARTHRITIS 07/16/2009   HYPOKALEMIA 11/06/2008   CAD in native artery 11/06/2008   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 ([place]) 11/23/2007   Dyslipidemia 11/23/2007   ROSACEA 11/23/2007   DYSPNEA  ON EXERTION 11/23/2007   UNS ADVRS EFF OTH RX MEDICINAL&BIOLOGICAL SBSTNC 11/23/2007   Hypertension 11/22/2007   Hx of Prostate ca 03/01/2004    Orientation RESPIRATION BLADDER Height & Weight     Self, Time, Situation, Place  O2 (2  liters) Continent, External catheter Weight: 140 lb 6.9 oz (63.7 kg) Height:  '5\' 9"'$  (175.3 cm)  BEHAVIORAL SYMPTOMS/MOOD NEUROLOGICAL BOWEL NUTRITION STATUS      Continent Diet (See dc summary)  AMBULATORY STATUS COMMUNICATION OF NEEDS Skin   Limited Assist Verbally Normal                       Personal Care Assistance Level of Assistance  Bathing, Feeding, Dressing Bathing Assistance: Maximum assistance Feeding assistance: Limited assistance Dressing Assistance: Maximum assistance     Functional Limitations Info  Sight, Hearing, Speech Sight Info: Adequate Hearing Info: Impaired Speech Info: Adequate    SPECIAL CARE FACTORS FREQUENCY                       Contractures Contractures Info: Not present    Additional Factors Info  Code Status, Allergies Code Status Info: Full Allergies Info: Codeine  Morphine           Current Medications (10/14/2022):  This is the current hospital active medication list Current Facility-Administered Medications  Medication Dose Route Frequency Provider Last Rate Last Admin   acetaminophen (TYLENOL) tablet 650 mg  650 mg Oral Q6H PRN Karmen Bongo, MD       Or   acetaminophen (TYLENOL) suppository 650 mg  650 mg Rectal Q6H PRN Karmen Bongo, MD       albuterol (PROVENTIL) (2.5 MG/3ML) 0.083% nebulizer solution 2.5 mg  2.5 mg Nebulization Q2H PRN Karmen Bongo, MD       apixaban Arne [place]) tablet 2.5 mg  2.5 mg Oral BID Karmen Bongo, MD   2.5 mg at 10/14/22 0940   bisacodyl (DULCOLAX) EC tablet 5 mg  5 mg Oral Daily PRN Karmen Bongo, MD       clopidogrel (PLAVIX) tablet 75 mg  75 mg Oral Daily Karmen Bongo, MD   75 mg at 10/14/22 0940   docusate sodium (COLACE) capsule 100 mg  100 mg Oral BID Karmen Bongo, MD   100 mg at 10/13/22 2123   FLUoxetine (PROZAC) capsule 20 mg  20 mg Oral Daily Karmen Bongo, MD   20 mg at 10/14/22 0940   furosemide (LASIX) injection 60 mg  60 mg Intravenous BID Geralynn Rile, MD   60 mg at 10/14/22 0932   guaiFENesin (MUCINEX) 12 hr tablet 600 mg  600 mg Oral BID PRN Karmen Bongo, MD       hydrALAZINE (APRESOLINE) injection 5 mg  5 mg Intravenous Q4H PRN Karmen Bongo, MD       insulin aspart (novoLOG) injection 0-15 Units  0-15 Units Subcutaneous TID WC Karmen Bongo, MD       insulin glargine-yfgn [facility]) injection 10 Units  10 Units Subcutaneous QPM Karmen Bongo, MD       melatonin tablet 5 mg  5 mg Oral QHS PRN Irene Pap N, DO   5 mg at 10/14/22 0132   mometasone-formoterol (DULERA) 200-5 MCG/ACT inhaler 2 puff  2 puff Inhalation BID Karmen Bongo, MD   2 puff at 10/14/22 0941   nicotine (NICODERM CQ - dosed in mg/24 hours) patch 14 mg  14 mg Transdermal Daily Karmen Bongo, MD   14 mg  at 10/14/22 0938   ondansetron (ZOFRAN) tablet 4 mg  4 mg Oral Q6H PRN Karmen Bongo, MD       Or   ondansetron [facility]) injection 4 mg  4 mg Intravenous Q6H PRN Karmen Bongo, MD       oseltamivir (TAMIFLU) capsule 30 mg  30 mg Oral Daily Danford, Suann Larry, MD       oxyCODONE (Oxy IR/ROXICODONE) immediate release tablet 5 mg  5 mg Oral Q4H PRN Karmen Bongo, MD   5 mg at 10/13/22 2124   pantoprazole (PROTONIX) EC tablet 40 mg  40 mg Oral Daily Karmen Bongo, MD   40 mg at 10/14/22 0940   polyethylene glycol (MIRALAX / GLYCOLAX) packet 17 g  17 g Oral Daily PRN Karmen Bongo, MD       rosuvastatin (CRESTOR) tablet 20 mg  20 mg Oral Daily Karmen Bongo, MD   20 mg at 10/14/22 0940   sodium chloride flush (NS) 0.9 % injection 3 mL  3 mL Intravenous Lillia Mountain, MD   3 mL at 10/14/22 0941   spironolactone (ALDACTONE) tablet 25 mg  25 mg Oral Daily Karmen Bongo, MD   25 mg at 10/14/22 0940     Discharge Medications: Please see discharge summary for a list of discharge medications.  Relevant Imaging Results:  Relevant Lab Results:   Additional Information SSN: [id number]  Devaughn Savant, MSW, LCSWA, LCASA Transitions of  Care  Clinical Social Worker I

## 2022-10-14 NOTE — Progress Notes (Signed)
Cardiology Progress Note  Patient ID: JULLIEN GRANQUIST MRN: [id number] DOB: [birth year] Date of Encounter: 10/14/2022  Primary Cardiologist: [name], MD  Subjective   Chief Complaint: Weakness/fatigue  HPI: BNP elevated from prior values.  Echo shows reduction in LVEF.  No further bradycardia.  Hyponatremia worsening.  With significant thrombocytopenia.  ROS:  All other ROS reviewed and negative. Pertinent positives noted in the HPI.     Inpatient Medications  Scheduled Meds:  apixaban  2.5 mg Oral BID   clopidogrel  75 mg Oral Daily   docusate sodium  100 mg Oral BID   FLUoxetine  20 mg Oral Daily   furosemide  60 mg Intravenous BID   insulin aspart  0-15 Units Subcutaneous TID WC   insulin glargine-yfgn  10 Units Subcutaneous QPM   mometasone-formoterol  2 puff Inhalation BID   nicotine  14 mg Transdermal Daily   oseltamivir  30 mg Oral Daily   pantoprazole  40 mg Oral Daily   rosuvastatin  20 mg Oral Daily   sodium chloride flush  3 mL Intravenous Q12H   spironolactone  25 mg Oral Daily   Continuous Infusions:  PRN Meds: acetaminophen **OR** acetaminophen, albuterol, bisacodyl, guaiFENesin, hydrALAZINE, melatonin, ondansetron **OR** ondansetron (ZOFRAN) IV, oxyCODONE, polyethylene glycol   Vital Signs   Vitals:   10/13/22 2000 10/13/22 2011 10/13/22 2300 10/14/22 0759  BP: (!) 112/57  121/67 (!) 107/59  Pulse: 69  70 73  Resp: '17  18 16  '$ Temp: 97.6 F (36.4 C)  97.6 F (36.4 C) (!) 97.4 F (36.3 C)  TempSrc: Oral  Oral Oral  SpO2: 95% 97% 98% 96%  Weight:      Height:        Intake/Output Summary (Last 24 hours) at 10/14/2022 0952 Last data filed at 10/14/2022 0800 Gross per 24 hour  Intake 480 ml  Output 800 ml  Net -320 ml      10/13/2022    3:26 PM 10/01/2022    3:27 PM 09/20/2022    8:39 AM  Last 3 Weights  Weight (lbs) 140 lb 6.9 oz 147 lb 9.6 oz 158 lb 11.7 oz  Weight (kg) 63.7 kg 66.951 kg 72 kg      Telemetry  Overnight telemetry  shows A-fib 40 to 70 bpm, which I personally reviewed.   Physical Exam   Vitals:   10/13/22 2000 10/13/22 2011 10/13/22 2300 10/14/22 0759  BP: (!) 112/57  121/67 (!) 107/59  Pulse: 69  70 73  Resp: '17  18 16  '$ Temp: 97.6 F (36.4 C)  97.6 F (36.4 C) (!) 97.4 F (36.3 C)  TempSrc: Oral  Oral Oral  SpO2: 95% 97% 98% 96%  Weight:      Height:        Intake/Output Summary (Last 24 hours) at 10/14/2022 0952 Last data filed at 10/14/2022 0800 Gross per 24 hour  Intake 480 ml  Output 800 ml  Net -320 ml       10/13/2022    3:26 PM 10/01/2022    3:27 PM 09/20/2022    8:39 AM  Last 3 Weights  Weight (lbs) 140 lb 6.9 oz 147 lb 9.6 oz 158 lb 11.7 oz  Weight (kg) 63.7 kg 66.951 kg 72 kg    Body mass index is 20.74 kg/m.  General: Ill-appearing Head: Atraumatic, normal size  Eyes: PEERLA, EOMI  Neck: Supple, no JVD Endocrine: No thryomegaly Cardiac: Normal S1, S2; irregular rhythm, no murmurs Lungs: Diminished breath  sounds bilaterally Abd: Soft, nontender, no hepatomegaly  Ext: No edema, pulses 2+ Musculoskeletal: No deformities, BUE and BLE strength normal and equal Skin: Warm and dry, no rashes   Neuro: Alert and oriented to person, place, time, and situation, CNII-XII grossly intact, no focal deficits  Psych: Normal mood and affect   Labs  High Sensitivity Troponin:   Recent Labs  Lab 09/20/22 0849 09/20/22 1302 10/13/22 0412 10/13/22 0645 10/13/22 1616  TROPONINIHS 59* 54* 43* 45* 53*     Cardiac EnzymesNo results for input(s): "TROPONINI" in the last 168 hours. No results for input(s): "TROPIPOC" in the last 168 hours.  Chemistry Recent Labs  Lab 10/13/22 0535 10/14/22 0303  NA 127* 126*  K 5.3* 4.7  CL 96* 99  CO2 21* 18*  GLUCOSE 118* 81  BUN 50* 34*  CREATININE 2.39* 1.79*  CALCIUM 8.9 9.0  GFRNONAA 26* 36*  ANIONGAP 10 9    Hematology Recent Labs  Lab 10/13/22 0412 10/14/22 0303  WBC 9.1 10.2  RBC 4.39 4.35  HGB 12.1* 11.6*  HCT 35.4*  35.6*  MCV 80.6 81.8  MCH 27.6 26.7  MCHC 34.2 32.6  RDW 18.1* 18.0*  PLT 79* 75*   BNP Recent Labs  Lab 10/13/22 1616  BNP 1,046.9*    DDimer No results for input(s): "DDIMER" in the last 168 hours.   Radiology  ECHOCARDIOGRAM COMPLETE  Result Date: 10/13/2022    ECHOCARDIOGRAM REPORT   Patient Name:   BARNABAS HENRIQUES Ruffins Date of Exam: 10/13/2022 Medical Rec #:  [id number]    Height:       69.0 in Accession #:    [id number]   Weight:       147.6 lb Date of Birth:  1932-12-25    BSA:          1.816 m Patient Age:    88 years     BP:           121/67 mmHg Patient Gender: M            HR:           75 bpm. Exam Location:  Inpatient Procedure: 2D Echo, Cardiac Doppler and Color Doppler Indications:    Chest Pain R07.9  History:        Patient has prior history of Echocardiogram examinations, most                 recent 02/06/2022. Cardiomyopathy, Aortic Valve Disease; Risk                 Factors:Diabetes.                 Aortic Valve: 29 mm Medtronic valve is present in the aortic                 position. Procedure Date: 04/28/21.  Sonographer:    Greer Pickerel Referring Phys: [id number] Margie Billet  Sonographer Comments: No subcostal window. Image acquisition challenging due to patient body habitus and Image acquisition challenging due to respiratory motion. IMPRESSIONS  1. Left ventricular ejection fraction, by estimation, is 25 to 30%. The left ventricle has severely decreased function. The left ventricle demonstrates global hypokinesis that appears worse in the septal segments. There is moderate concentric left ventricular hypertrophy. Left ventricular diastolic parameters are indeterminate.  2. Right ventricular systolic function is severely reduced. The right ventricular size is normal. PASP is 39.27mHg +RAP.  3. Left atrial size was severely dilated.  4. Right atrial  size was severely dilated.  5. The mitral valve is degenerative. Mild mitral valve regurgitation. Moderate to severe mitral annular  calcification.  6. The aortic valve has been repaired/replaced. Aortic valve regurgitation is trivial. There is a 29 mm Medtronic valve present in the aortic position. Procedure Date: 04/28/21. Mean gradient 1.48mHg with Vmax 0.949m in the setting of low output state. LVOT VTI 12.4.  7. Aortic dilatation noted. There is mild dilatation of the ascending aorta, measuring 40 mm. Comparison(s): Compared to prior TTE on 05/2022, the LVEF has decreased to 25-30% with continued septal wall motion abnormalites. FINDINGS  Left Ventricle: Left ventricular ejection fraction, by estimation, is 25 to 30%. The left ventricle has severely decreased function. There is global hypokinesis that appears worse in the septal segments. The left ventricular internal cavity size was normal in size. There is moderate concentric left ventricular hypertrophy. Left ventricular diastolic parameters are indeterminate. Right Ventricle: The right ventricular size is normal. No increase in right ventricular wall thickness. Right ventricular systolic function is severely reduced. Left Atrium: Left atrial size was severely dilated. Right Atrium: Right atrial size was severely dilated. Pericardium: There is no evidence of pericardial effusion. Mitral Valve: The mitral valve is degenerative in appearance. There is moderate thickening of the mitral valve leaflet(s). There is mild calcification of the mitral valve leaflet(s). Moderate to severe mitral annular calcification. Mild mitral valve regurgitation. Tricuspid Valve: The tricuspid valve is normal in structure. Tricuspid valve regurgitation is trivial. Aortic Valve: The aortic valve has been repaired/replaced. Aortic valve regurgitation is trivial. There is a 29 mm Medtronic valve present in the aortic position. Procedure Date: 04/28/21. Pulmonic Valve: The pulmonic valve was normal in structure. Pulmonic valve regurgitation is trivial. Aorta: Aortic dilatation noted. There is mild dilatation of the  ascending aorta, measuring 40 mm. IAS/Shunts: The atrial septum is grossly normal.  LEFT VENTRICLE PLAX 2D LVIDd:         4.20 cm      Diastology LVIDs:         3.30 cm      LV e' medial:  3.37 cm/s LV PW:         1.60 cm      LV e' lateral: 5.15 cm/s LV IVS:        1.30 cm LVOT diam:     2.20 cm LV SV:         37 LV SV Index:   20 LVOT Area:     3.80 cm  LV Volumes (MOD) LV vol d, MOD A2C: 88.8 ml LV vol d, MOD A4C: 102.0 ml LV vol s, MOD A2C: 54.1 ml LV vol s, MOD A4C: 71.5 ml LV SV MOD A2C:     34.7 ml LV SV MOD A4C:     102.0 ml LV SV MOD BP:      34.6 ml RIGHT VENTRICLE RV S prime:     4.67 cm/s TAPSE (M-mode): 0.9 cm LEFT ATRIUM              Index        RIGHT ATRIUM           Index LA diam:        4.50 cm  2.48 cm/m   RA Area:     28.80 cm LA Vol (A2C):   115.0 ml 63.33 ml/m  RA Volume:   97.00 ml  53.42 ml/m LA Vol (A4C):   103.0 ml 56.73 ml/m LA Biplane Vol: 110.0 ml 60.58  ml/m  AORTIC VALVE             PULMONIC VALVE LVOT Vmax:   64.60 cm/s  PR End Diast Vel: 9.86 msec LVOT Vmean:  41.300 cm/s LVOT VTI:    0.098 m  AORTA Ao Root diam: 3.40 cm Ao Asc diam:  4.10 cm MITRAL VALVE             TRICUSPID VALVE MV Area (PHT): 4.15 cm  TR Peak grad:   39.4 mmHg MV Decel Time: 183 msec  TR Vmax:        314.00 cm/s                           SHUNTS                          Systemic VTI:  0.10 m                          Systemic Diam: 2.20 cm Gwyndolyn Kaufman MD Electronically signed by Gwyndolyn Kaufman MD Signature Date/Time: 10/13/2022/5:05:06 PM    Final    DG Chest Portable 1 View  Result Date: 10/13/2022 CLINICAL DATA:  88 year old male with history of shortness of breath. EXAM: PORTABLE CHEST 1 VIEW COMPARISON:  Chest x-ray 10/01/2022. FINDINGS: Lung volumes are low. Ill-defined airspace consolidation in the periphery of the lower right lung. Probable areas of subsegmental atelectasis in the base of the left lung. Mild blunting of the costophrenic sulci bilaterally suggesting trace bilateral  pleural effusions. No pneumothorax. Cardiomegaly with cephalization of the pulmonary vasculature, but no frank pulmonary edema. Upper mediastinal contours are within normal limits. Atherosclerotic calcifications in the thoracic aorta. Status post median sternotomy for CABG. Patient is also status post TAVR. IMPRESSION: 1. New area of ill-defined airspace consolidation in the periphery of the right lung base either in the right middle or right lower lobe, concerning for developing pneumonia. Followup PA and lateral chest X-ray is recommended in 3-4 weeks following trial of antibiotic therapy to ensure resolution and exclude underlying malignancy. 2. Additional left basilar subsegmental atelectasis or scarring. 3. Trace bilateral pleural effusions. 4. Cardiomegaly with pulmonary venous congestion, but no frank pulmonary edema. 5. Aortic atherosclerosis. Electronically Signed   By: Vinnie Langton M.D.   On: 10/13/2022 05:58    Cardiac Studies  TTE 10/13/2022  1. Left ventricular ejection fraction, by estimation, is 25 to 30%. The  left ventricle has severely decreased function. The left ventricle  demonstrates global hypokinesis that appears worse in the septal segments.  There is moderate concentric left  ventricular hypertrophy. Left ventricular diastolic parameters are  indeterminate.   2. Right ventricular systolic function is severely reduced. The right  ventricular size is normal. PASP is 39.283mHg +RAP.   3. Left atrial size was severely dilated.   4. Right atrial size was severely dilated.   5. The mitral valve is degenerative. Mild mitral valve regurgitation.  Moderate to severe mitral annular calcification.   6. The aortic valve has been repaired/replaced. Aortic valve  regurgitation is trivial. There is a 29 mm Medtronic valve present in the  aortic position. Procedure Date: 04/28/21. Mean gradient 1.450mg with Vmax  0.83m52min the setting of low output state.  LVOT VTI 12.4.   7. Aortic  dilatation noted. There is mild dilatation of the ascending  aorta, measuring 40 mm.   Patient Profile  88 year old male with history of CAD status post CABG (multiple interventions to vein grafts and native CAD), left bundle branch block, persistent atrial fibrillation on Eliquis, systolic heart failure EF 35 to 40%, CKD, diabetes, hypertension, advanced age who was admitted on 10/13/2022 for weakness and fatigue. Cardiology was consulted for possible bradycardia.   Assessment & Plan   # Weakness/fatigue # Influenza A/COVID-19 infection # Pneumonia -Currently admitted with weakness and fatigue.  Concerns for bradycardia but really has had no further episodes of carvedilol.  I do not believe this is contributing to his overall picture. -Chest x-ray with possible developing pneumonia.  I would like to get a chest CT without contrast to determine what is going on. -Procalcitonin is negative.  Currently on influenza A treatment.  He may need antibiotics.  I will defer this to the hospital medicine team.  # Acute systolic heart failure, EF 25-30% -He does not appear that grossly volume overloaded.  His BNP is elevated from prior values.  His echo shows EF is now reduced. -He does not clinically appear volume overloaded.  We will give him diuresis today to see if this helps.  Kidney function is improving. -Start Lasix 60 mg IV twice daily. -No beta-blocker given bradycardia.  See discussion below. -Continue Aldactone 25 mg daily. -Not a candidate for ACE/ARB/ARNI given CKD. -We will see if we can add Imdur or hydralazine as we are able.  # Atrial fibrillation with SVR # Sinus pauses -Initially admitted with concerns for A-fib with SVR and sinus pauses.  He has had no further episodes.  He was on a beta-blocker.  Will keep him on telemetry.  No indications for pacing currently.  Close monitoring while here. -Okay to continue Eliquis. -He may need EP evaluation but we will let his beta-blocker  washout before we involve them in his care.  I am not entirely convinced bradycardia is his big issue here.  # Hyponatremia -Could be related to congestive heart failure.  We will give a trial of Lasix today to see if this helps.  # Thrombocytopenia -Unclear etiology.  I will defer this workup to hospital medicine.  Okay to continue Eliquis for now.  # CAD status post CABG -Troponins are minimally elevated and flat. -No further chest pain this morning. -Continue Plavix only as he is on Eliquis. -No beta-blocker. -Complex history of PCI to vein grafts and native CAD.  # AKI -Improving.    For questions or updates, please contact [place] Please consult [email or web address] for contact info under   Signed, [place] T. [name], MD, [place]  10/14/2022 9:52 AM

## 2022-10-14 NOTE — Evaluation (Signed)
Occupational Therapy Evaluation Patient Details Name: [name] MRN: [id number] DOB: 02-18-1933 Today's Date: 10/14/2022   History of Present Illness Patient 88 y.o. who presented pm 12/27 with weakness and bradycardia, + flu. Pt with recent  PNA, RSV and COVID 19.PMH includes HTN, HLD, PAF on Eliquis, CHF, CAD s/p CABG, DM type II, prostate cancer.   Clinical Impression   At his baseline, pt walks with a cane and is completes ADLs independently. He has recently needed help with dressing and could only walk a few feet before becoming fatigued and needing to sit per his wife. Pt presents with impaired cognition, generalized weakness, poor activity tolerance and impaired balance. He requires min to total assist for ADLs and moderate assistance for bed mobility and to stand and take side steps along EOB. Pt with stable VS on RA throughout session. Recommending ST rehab in SNF, family is in agreement.      Recommendations for follow up therapy are one component of a multi-disciplinary discharge planning process, led by the attending physician.  Recommendations may be updated based on patient status, additional functional criteria and insurance authorization.   Follow Up Recommendations  Skilled nursing-short term rehab (<3 hours/day)     Assistance Recommended at Discharge Frequent or constant Supervision/Assistance  Patient can return home with the following A lot of help with walking and/or transfers;A lot of help with bathing/dressing/bathroom;Assistance with cooking/housework;Assistance with feeding;Direct supervision/assist for medications management;Direct supervision/assist for financial management;Assist for transportation;Help with stairs or ramp for entrance    Functional Status Assessment  Patient has had a recent decline in their functional status and demonstrates the ability to make significant improvements in function in a reasonable and predictable amount of time.  Equipment  Recommendations  Other (comment) (defer to next venue)    Recommendations for Other Services       Precautions / Restrictions Precautions Precautions: Fall Restrictions Weight Bearing Restrictions: No      Mobility Bed Mobility Overal bed mobility: Needs Assistance Bed Mobility: Supine to Sit, Sit to Supine     Supine to sit: Mod assist Sit to supine: Mod assist   General bed mobility comments: assist to raise trunk and for hips to EOB, assist for LEs back into bed    Transfers Overall transfer level: Needs assistance Equipment used: 1 person hand held assist Transfers: Sit to/from Stand Sit to Stand: Mod assist           General transfer comment: posterior bias, stabilizing back of legs on bed initially, able to take side steps with mod assist along EOB      Balance Overall balance assessment: Needs assistance   Sitting balance-Leahy Scale: Fair Sitting balance - Comments: cues to keep feet on floor Postural control: Posterior lean   Standing balance-Leahy Scale: Poor                             ADL either performed or assessed with clinical judgement   ADL Overall ADL's : Needs assistance/impaired Eating/Feeding: Minimal assistance;Bed level   Grooming: Wash/dry hands;Sitting;Supervision/safety   Upper Body Bathing: Moderate assistance;Sitting   Lower Body Bathing: Total assistance;Sit to/from stand   Upper Body Dressing : Minimal assistance;Sitting   Lower Body Dressing: Total assistance;Sit to/from stand                 General ADL Comments: fatigues easily     Vision Baseline Vision/History: 1 Wears glasses Ability to See in  Adequate Light: 0 Adequate Patient Visual Report: No change from baseline       Perception     Praxis      Pertinent Vitals/Pain Pain Assessment Pain Assessment: No/denies pain     Hand Dominance Right   Extremity/Trunk Assessment Upper Extremity Assessment Upper Extremity Assessment:  Generalized weakness   Lower Extremity Assessment Lower Extremity Assessment: Defer to PT evaluation   Cervical / Trunk Assessment Cervical / Trunk Assessment: Kyphotic   Communication Communication Communication: HOH   Cognition Arousal/Alertness: Awake/alert Behavior During Therapy: WFL for tasks assessed/performed Overall Cognitive Status: Impaired/Different from baseline Area of Impairment: Memory, Following commands, Problem solving, Orientation                 Orientation Level: Time, Situation   Memory: Decreased short-term memory Following Commands: Follows one step commands with increased time     Problem Solving: Slow processing, Decreased initiation, Requires verbal cues General Comments: HOH     General Comments       Exercises     Shoulder Instructions      Home Living Family/patient expects to be discharged to:: Private residence Living Arrangements: Spouse/significant other Available Help at Discharge: Family;Available 24 hours/day Type of Home: House Home Access: Stairs to enter [employer] of Steps: 2 in the front vs none in the back Entrance Stairs-Rails: None Home Layout: Two level;Able to live on main level with bedroom/bathroom     Bathroom Shower/Tub: Occupational psychologist: Handicapped height     Home Equipment: [place] - single point          Prior Functioning/Environment Prior Level of Function : Independent/Modified Independent             Mobility Comments: Uses SPC as needed at his baseline. ADLs Comments: Wife helps with dressing recently, no longer drives, and does not participate with iADL        OT Problem List: Decreased strength;Decreased activity tolerance;Impaired balance (sitting and/or standing);Decreased cognition;Decreased knowledge of use of DME or AE      OT Treatment/Interventions: Self-care/ADL training;Therapeutic activities;Patient/family education;Balance training    OT  Goals(Current goals can be found in the care plan section) Acute Rehab OT Goals OT Goal Formulation: With patient Time For Goal Achievement: 10/28/22 Potential to Achieve Goals: Good ADL Goals Pt Will Perform Grooming: with min guard assist;standing Pt Will Perform Upper Body Bathing: with supervision;sitting Pt Will Perform Upper Body Dressing: with supervision;sitting Pt Will Transfer to Toilet: with min guard assist;ambulating;bedside commode Additional ADL Goal #1: Pt will perform bed mobility modified independently in preparation for ADLs. Additional ADL Goal #2: Pt will recall at least 3 energy conservation strategies as instructed.  OT Frequency: Min 2X/week    Co-evaluation              AM-PAC OT "6 Clicks" Daily Activity     Outcome Measure Help from another person eating meals?: A Little Help from another person taking care of personal grooming?: A Little Help from another person toileting, which includes using toliet, bedpan, or urinal?: Total Help from another person bathing (including washing, rinsing, drying)?: A Lot Help from another person to put on and taking off regular upper body clothing?: A Little Help from another person to put on and taking off regular lower body clothing?: Total 6 Click Score: 13   End of Session Equipment Utilized During Treatment: Gait belt;Rolling walker (2 wheels)  Activity Tolerance: Patient limited by fatigue Patient left: in bed;with call bell/phone  within reach;with bed alarm set;with family/visitor present  OT Visit Diagnosis: Unsteadiness on feet (R26.81);Other abnormalities of gait and mobility (R26.89);Muscle weakness (generalized) (M62.81);Other symptoms and signs involving cognitive function                Time: 2130-8657 OT Time Calculation (min): 30 min Charges:  OT General Charges $OT Visit: 1 Visit OT Evaluation $OT Eval Moderate Complexity: 1 Mod OT Treatments $Self Care/Home Management : 8-22 mins  Cleta Alberts,  OTR/L Acute Rehabilitation Services Office: [phone]   Malka So 10/14/2022, 11:13 AM

## 2022-10-14 NOTE — Progress Notes (Signed)
Progress Note   Patient: [name] KZS:[id number] DOB: 1932-12-17 DOA: 10/13/2022     0 DOS: the patient was seen and examined on 10/14/2022 at 8:09AM      Brief hospital course: Mr. Crowson is an 88 y.o. M with pAF on Eliquis, CAD s/p CABG and multiple PCI last >57yr DM, HTN, HLD, sdCHF EF now 25-30%, CKD IIIb baseline 1.7-2.0, COVID earlier this month, and prosCA who presented with weakness and fatigue.  In the ER, influenza A positive.   12/27: Admitted on Tamiflu, Cards consulted 12/28: CT chest ordered     Assessment and Plan: * Influenza A Given severity of illness, will start Tamiflu - Start oseltamivir, renally dosed - Pulmonary toilet - Follow CT chest - Trend procalcitonin   Symptomatic bradycardia - Consult Cardiology - Hold nodal blocking agents  Gross hematuria Family reported gross hematuria overnight, no bleeding observed by nursing.  In setting of plts 75K - Check urinalysis and urine culture  Thrombocytopenia (HCC) Platelets down to 75,000, no prior baseline, likely due to influenza.  Transient hematuria last night reported - Trend platelets  Hyponatremia Appears euvolemic to hypervolemic.  Antibiotic with weakness and slight confusion.  Cardiology continuing diuretics. - Check serum and urine osmolality, urine sodium - Trend sodium - Fluid restriction - Hold SSRI  Anemia Hemoglobin stable   Type 2 diabetes mellitus with hyperlipidemia (HCC) Glucose controlled - Continue glargine - Continue sliding scale corrections - Hold Januvia  Stage 3b chronic kidney disease (CKD) (HCC) Baseline 1.7-2, creatinine stable relative to baseline.  Acute kidney injury ruled out  Chronic systolic CHF (congestive heart failure) (HCC) Volume status hard to assess - Diuretics per cardiology - Strict ins and outs - Daily BMP - Consult cardiology  Persistent atrial fibrillation ([place] Rate controlled - Continue Eliquis - Hold carvedilol  Pure  hypercholesterolemia - Continue Crestor  Coronary artery disease of native artery of native heart with stable angina pectoris (HCC) - Continue Eliquis, Crestor, spironolactone - Hold carvedilol  Hypertension BP normal - Continue Lasix, spiro - Hold amlodipine Coreg torsemide          Subjective: Still weak and Confused.  States that his wife is his "mother", and his son is his "father".  Oriented to "hospital", but not not specifically where or why.  No chest pain, dyspnea.     Physical Exam: BP (!) 107/59 (BP Location: Left Arm)   Pulse 73   Temp (!) 97.4 F (36.3 C) (Oral)   Resp 16   Ht '5\' 9"'$  (1.753 m)   Wt 63.7 kg   SpO2 96%   BMI 20.74 kg/m   Elderly adult male, sitting up in recliner, appears comfortable but disoriented RRR, no murmurs, no significant lower extremity edema, no JVD Respiratory rate normal, lung sounds coarse bilaterally, no wheezing Abdomen soft without grimace to palpation Somewhat inattentive, attention diminished, disoriented to family, situation, oriented to "hospital", face symmetric, speech fluent, severe generalized weakness    Data Reviewed: Sodium down to 126 Creatinine down to 1.7 Patient metabolic panel otherwise normal BNP 1046 Troponin 40-50, flat and stable TSH minimally elevated at 5.6 Lactic acid normal Procalcitonin undetectable Hemoglobin 11, platelets 75,000    Family Communication: Wife and son at the bedside    Disposition: Status is: Inpatient The patient has symptomatic hyponatremia, will get IV Lasix, and has generalized weakness making it unsafe to discharge home        Author: CEdwin Dada MD 10/14/2022 1:48 PM  For on  call review [email or web address].

## 2022-10-14 NOTE — Assessment & Plan Note (Addendum)
-   Continue Eliquis, Crestor - Hold carvedilol, spironolactone

## 2022-10-14 NOTE — Assessment & Plan Note (Addendum)
Baseline 1.7-2, creatinine stable relative to baseline.  Acute kidney injury ruled out

## 2022-10-14 NOTE — Plan of Care (Signed)
  Problem: Clinical Measurements: Goal: Respiratory complications will improve Outcome: Progressing   Problem: Clinical Measurements: Goal: Cardiovascular complication will be avoided Outcome: Progressing   Problem: Activity: Goal: Risk for activity intolerance will decrease Outcome: Progressing   Problem: Nutrition: Goal: Adequate nutrition will be maintained Outcome: Progressing   Problem: Coping: Goal: Level of anxiety will decrease Outcome: Progressing   Problem: Pain Managment: Goal: General experience of comfort will improve Outcome: Progressing   Problem: Safety: Goal: Ability to remain free from injury will improve Outcome: Progressing   Problem: Skin Integrity: Goal: Risk for impaired skin integrity will decrease Outcome: Progressing

## 2022-10-14 NOTE — Assessment & Plan Note (Addendum)
BP normal - Continue amlodipine - Hold spironolactone, Coreg, torsemide

## 2022-10-14 NOTE — Assessment & Plan Note (Addendum)
Completed 5 days Tamiflu.  Procalcitonin low and stayed low.  Unfortunately, the patient has been quite debilitated and anergic/asthenic with his illness.  Oral intake low, mobility poor.  This is contributing to delirium and failure to thrive.    - Aggressive pulmonary toilet

## 2022-10-15 ENCOUNTER — Ambulatory Visit: Payer: Medicare PPO | Admitting: Family Medicine

## 2022-10-15 DIAGNOSIS — I4819 Other persistent atrial fibrillation: Secondary | ICD-10-CM | POA: Diagnosis not present

## 2022-10-15 DIAGNOSIS — I25118 Atherosclerotic heart disease of native coronary artery with other forms of angina pectoris: Secondary | ICD-10-CM

## 2022-10-15 DIAGNOSIS — D696 Thrombocytopenia, unspecified: Secondary | ICD-10-CM

## 2022-10-15 DIAGNOSIS — N1832 Chronic kidney disease, stage 3b: Secondary | ICD-10-CM

## 2022-10-15 DIAGNOSIS — I5022 Chronic systolic (congestive) heart failure: Secondary | ICD-10-CM | POA: Diagnosis not present

## 2022-10-15 LAB — BASIC METABOLIC PANEL
Anion gap: 13 (ref 5–15)
BUN: 33 mg/dL — ABNORMAL HIGH (ref 8–23)
CO2: 19 mmol/L — ABNORMAL LOW (ref 22–32)
Calcium: 9.5 mg/dL (ref 8.9–10.3)
Chloride: 96 mmol/L — ABNORMAL LOW (ref 98–111)
Creatinine, Ser: 1.7 mg/dL — ABNORMAL HIGH (ref 0.61–1.24)
GFR, Estimated: 39 mL/min — ABNORMAL LOW (ref >60)
Glucose, Bld: 132 mg/dL — ABNORMAL HIGH (ref 70–99)
Potassium: 5.1 mmol/L (ref 3.5–5.1)
Sodium: 128 mmol/L — ABNORMAL LOW (ref 135–145)

## 2022-10-15 LAB — URINE CULTURE: Culture: NO GROWTH

## 2022-10-15 LAB — CBC
HCT: 39.2 % (ref 39.0–52.0)
Hemoglobin: 13 g/dL (ref 13.0–17.0)
MCH: 27 pg (ref 26.0–34.0)
MCHC: 33.2 g/dL (ref 30.0–36.0)
MCV: 81.5 fL (ref 80.0–100.0)
Platelets: 82 10*3/uL — ABNORMAL LOW (ref 150–400)
RBC: 4.81 MIL/uL (ref 4.22–5.81)
RDW: 18.6 % — ABNORMAL HIGH (ref 11.5–15.5)
WBC: 9.7 10*3/uL (ref 4.0–10.5)
nRBC: 0 % (ref 0.0–0.2)

## 2022-10-15 LAB — PROCALCITONIN: Procalcitonin: 0.12 ng/mL

## 2022-10-15 LAB — T3: T3, Total: 89 ng/dL (ref 71–180)

## 2022-10-15 LAB — GLUCOSE, CAPILLARY
Glucose-Capillary: 109 mg/dL — ABNORMAL HIGH (ref 70–99)
Glucose-Capillary: 195 mg/dL — ABNORMAL HIGH (ref 70–99)
Glucose-Capillary: 130 mg/dL — ABNORMAL HIGH (ref 70–99)

## 2022-10-15 LAB — OSMOLALITY: Osmolality: 288 mOsm/kg (ref 275–295)

## 2022-10-15 NOTE — Progress Notes (Signed)
SOB after getting out of the bed to get the weight. Sp02= 91% on room air, RR = 24 bpm, Labored breathing, (+) rochi on BLF. PRN nebs given. Hooked to supplemental oxygen at 2 lpm. After interventions, problem resolved. Lying on bed with HOB at 30. Plan of care ongoing.  Kaylyn Lim, BSN, [employer]

## 2022-10-15 NOTE — Consult Note (Signed)
   [facility] CM Inpatient Consult   10/15/2022  Shawn BROADFOOT [birth year] [id number]  [employer] [[employer]O] Patient: [employer] Medicare PPO  *Patient is on droplet precaution  Primary Care Provider:  [name], MD, with [place] [facility] which is listed to provide the transition of care follow up  Patient screened for less than 30 days readmission hospitalization with noted high risk score for unplanned readmission risk to assess for potential [place] Management service needs for post hospital transition for care coordination.  Review of patient's electronic medical record reveals patient is being recommended for a skilled nursing facility level of care. However, patient is positive for Influenza A and COVID and after progression meeting the family is decided on home with [facility] [[facility]] when medically ready per son on conference call as discussed with the inpatient [facility] team.   Plan:  Continue to follow progress and disposition to assess for post hospital community care coordination/management needs.  Referral request for community care coordination: will request if disposition remains for home with [facility]  Of note, [facility] Care Management/Population Health does not replace or interfere with any arrangements made by the Inpatient Transition of Care team.  For questions contact:   Natividad Brood, RN BSN [place]  [phone] business mobile phone Toll free office [phone]  *[place]  [phone] Fax number: [phone] [email or web address]

## 2022-10-15 NOTE — Progress Notes (Signed)
Pharmacist Heart Failure Core Measure Documentation  Assessment: [name] has an EF documented as 25-30% on 10/13/22 by ECHO.  Rationale: Heart failure patients with left ventricular systolic dysfunction (LVSD) and an EF < 40% should be prescribed an angiotensin converting enzyme inhibitor (ACEI) or angiotensin receptor blocker (ARB) at discharge unless a contraindication is documented in the medical record.  This patient is not currently on an ACEI or ARB for HF.  This note is being placed in the record in order to provide documentation that a contraindication to the use of these agents is present for this encounter.  ACE Inhibitor or Angiotensin Receptor Blocker is contraindicated (specify all that apply)  '[]'$   ACEI allergy AND ARB allergy '[]'$   Angioedema '[]'$   Moderate or severe aortic stenosis '[]'$   Hyperkalemia '[]'$   Hypotension '[]'$   Renal artery stenosis '[x]'$   Worsening renal function, preexisting renal disease or dysfunction   Dimple Nanas, PharmD, BCPS 10/15/2022 7:53 PM

## 2022-10-15 NOTE — Progress Notes (Signed)
Cardiology Progress Note  Patient ID: [name] MRN: [id number] DOB: [birth year] Date of Encounter: 10/15/2022  Primary Cardiologist: [name], MD  Subjective   Chief Complaint: SOB  HPI: Creatinine stable.  Sodium improving with diuresis.  Chest CT with possible pneumonia.  ROS:  All other ROS reviewed and negative. Pertinent positives noted in the HPI.     Inpatient Medications  Scheduled Meds:  apixaban  2.5 mg Oral BID   clopidogrel  75 mg Oral Daily   docusate sodium  100 mg Oral BID   feeding supplement (GLUCERNA SHAKE)  237 mL Oral TID BM   furosemide  60 mg Intravenous BID   insulin aspart  0-15 Units Subcutaneous TID WC   insulin glargine-yfgn  10 Units Subcutaneous QPM   mometasone-formoterol  2 puff Inhalation BID   multivitamin  1 tablet Oral QHS   nicotine  14 mg Transdermal Daily   oseltamivir  30 mg Oral Daily   pantoprazole  40 mg Oral Daily   rosuvastatin  20 mg Oral Daily   sodium chloride flush  3 mL Intravenous Q12H   spironolactone  25 mg Oral Daily   Continuous Infusions:  PRN Meds: acetaminophen **OR** acetaminophen, albuterol, bisacodyl, guaiFENesin, hydrALAZINE, melatonin, ondansetron **OR** ondansetron (ZOFRAN) IV, oxyCODONE, polyethylene glycol   Vital Signs   Vitals:   10/14/22 2052 10/15/22 0449 10/15/22 0700 10/15/22 0800  BP: 107/64 (!) 105/59 (!) 104/53 (!) 110/54  Pulse: 74 73 60 73  Resp: '20 20 17 16  '$ Temp: (!) 97.4 F (36.3 C) 97.6 F (36.4 C) 97.6 F (36.4 C) (!) 97.5 F (36.4 C)  TempSrc: Oral Oral Oral Oral  SpO2: 95% 94% 99% 98%  Weight:  63.1 kg    Height:        Intake/Output Summary (Last 24 hours) at 10/15/2022 0953 Last data filed at 10/15/2022 0451 Gross per 24 hour  Intake 720 ml  Output 1300 ml  Net -580 ml      10/15/2022    4:49 AM 10/13/2022    3:26 PM 10/01/2022    3:27 PM  Last 3 Weights  Weight (lbs) 139 lb 1.6 oz 140 lb 6.9 oz 147 lb 9.6 oz  Weight (kg) 63.095 kg 63.7 kg 66.951 kg       Telemetry  Overnight telemetry shows A-fib heart rate 40 to 60 bpm, which I personally reviewed.   Physical Exam   Vitals:   10/14/22 2052 10/15/22 0449 10/15/22 0700 10/15/22 0800  BP: 107/64 (!) 105/59 (!) 104/53 (!) 110/54  Pulse: 74 73 60 73  Resp: '20 20 17 16  '$ Temp: (!) 97.4 F (36.3 C) 97.6 F (36.4 C) 97.6 F (36.4 C) (!) 97.5 F (36.4 C)  TempSrc: Oral Oral Oral Oral  SpO2: 95% 94% 99% 98%  Weight:  63.1 kg    Height:        Intake/Output Summary (Last 24 hours) at 10/15/2022 0953 Last data filed at 10/15/2022 0451 Gross per 24 hour  Intake 720 ml  Output 1300 ml  Net -580 ml       10/15/2022    4:49 AM 10/13/2022    3:26 PM 10/01/2022    3:27 PM  Last 3 Weights  Weight (lbs) 139 lb 1.6 oz 140 lb 6.9 oz 147 lb 9.6 oz  Weight (kg) 63.095 kg 63.7 kg 66.951 kg    Body mass index is 20.54 kg/m.  General: Well nourished, well developed, in no acute distress Head: Atraumatic, normal  size  Eyes: PEERLA, EOMI  Neck: Supple, no JVD Endocrine: No thryomegaly Cardiac: Normal S1, S2; irregular rhythm, no murmurs Lungs: Clear to auscultation bilaterally, no wheezing, rhonchi or rales  Abd: Soft, nontender, no hepatomegaly  Ext: No edema, pulses 2+ Musculoskeletal: No deformities, BUE and BLE strength normal and equal Skin: Warm and dry, no rashes   Neuro: Alert and oriented to person, place, time, and situation, CNII-XII grossly intact, no focal deficits  Psych: Normal mood and affect   Labs  High Sensitivity Troponin:   Recent Labs  Lab 09/20/22 0849 09/20/22 1302 10/13/22 0412 10/13/22 0645 10/13/22 1616  TROPONINIHS 59* 54* 43* 45* 53*     Cardiac EnzymesNo results for input(s): "TROPONINI" in the last 168 hours. No results for input(s): "TROPIPOC" in the last 168 hours.  Chemistry Recent Labs  Lab 10/13/22 0535 10/14/22 0303 10/15/22 0205  NA 127* 126* 128*  K 5.3* 4.7 5.1  CL 96* 99 96*  CO2 21* 18* 19*  GLUCOSE 118* 81 132*  BUN 50* 34*  33*  CREATININE 2.39* 1.79* 1.70*  CALCIUM 8.9 9.0 9.5  GFRNONAA 26* 36* 39*  ANIONGAP '10 9 13    '$ Hematology Recent Labs  Lab 10/13/22 0412 10/14/22 0303 10/15/22 0205  WBC 9.1 10.2 9.7  RBC 4.39 4.35 4.81  HGB 12.1* 11.6* 13.0  HCT 35.4* 35.6* 39.2  MCV 80.6 81.8 81.5  MCH 27.6 26.7 27.0  MCHC 34.2 32.6 33.2  RDW 18.1* 18.0* 18.6*  PLT 79* 75* 82*   BNP Recent Labs  Lab 10/13/22 1616  BNP 1,046.9*    DDimer No results for input(s): "DDIMER" in the last 168 hours.   Radiology  CT CHEST WO CONTRAST  Result Date: 10/14/2022 CLINICAL DATA:  Pneumonia. Pleural effusion. EXAM: CT CHEST WITHOUT CONTRAST TECHNIQUE: Multidetector CT imaging of the chest was performed following the standard protocol without IV contrast. RADIATION DOSE REDUCTION: This exam was performed according to the departmental dose-optimization program which includes automated exposure control, adjustment of the mA and/or kV according to patient size and/or use of iterative reconstruction technique. COMPARISON:  09/02/2021 FINDINGS: Cardiovascular: The heart is upper limits of normal in size for the patient's age. No pericardial effusion. Stable surgical changes from TAVR. Stable surgical changes from coronary artery bypass surgery. Stable aortic and extensive three-vessel coronary artery calcifications. Mediastinum/Nodes: Stable scattered calcified and noncalcified mediastinal and hilar lymph nodes likely related to remote granulomas disease. No mass or overt adenopathy the. The esophagus is grossly. Lungs/Pleura: Stable underlying emphysematous changes and areas of pulmonary scarring. Significant areas of ground-glass opacity most notably in the right middle lobe and right upper lobe. This is typically seen with partial airspace filling process such as edema, inflammations/alveolitis. It could also be seen with constrictive bronchiolitis, hypersensitivity pneumonitis or cryptogenic organizing pneumonia. Stable area of  rounded atelectasis at the left lung base. Moderate bibasilar atelectasis. No pleural effusions or pulmonary edema. Upper Abdomen: No significant upper abdominal findings. Stable advanced vascular disease. Musculoskeletal: No chest wall mass, supraclavicular or axillary adenopathy. The bony thorax is intact. IMPRESSION: 1. Stable emphysematous changes and areas of pulmonary scarring. 2. Significant areas of ground-glass opacity most notably in the right middle lobe and right upper lobe. This is typically seen with partial airspace filling process such as edema, inflammations/alveolitis. It could also be seen with constrictive bronchiolitis, hypersensitivity pneumonitis or cryptogenic organizing pneumonia. 3. Stable area of rounded atelectasis at the left lung base. 4. Stable advanced vascular disease. 5. No mediastinal or  hilar mass or adenopathy. Stable scattered calcified and noncalcified nodes. 6. Stable surgical changes from TAVR and coronary artery bypass surgery. Aortic Atherosclerosis (ICD10-I70.0) and Emphysema (ICD10-J43.9). Electronically Signed   By: Marijo Sanes M.D.   On: 10/14/2022 14:06   ECHOCARDIOGRAM COMPLETE  Result Date: 10/13/2022    ECHOCARDIOGRAM REPORT   Patient Name:   JHALIL SILVERA Skerritt Date of Exam: 10/13/2022 Medical Rec #:  [id number]    Height:       69.0 in Accession #:    [id number]   Weight:       147.6 lb Date of Birth:  November 27, 1932    BSA:          1.816 m Patient Age:    89 years     BP:           121/67 mmHg Patient Gender: M            HR:           75 bpm. Exam Location:  Inpatient Procedure: 2D Echo, Cardiac Doppler and Color Doppler Indications:    Chest Pain R07.9  History:        Patient has prior history of Echocardiogram examinations, most                 recent 02/06/2022. Cardiomyopathy, Aortic Valve Disease; Risk                 Factors:Diabetes.                 Aortic Valve: 29 mm Medtronic valve is present in the aortic                 position. Procedure Date: 04/28/21.   Sonographer:    Greer Pickerel Referring Phys: [id number] Margie Billet  Sonographer Comments: No subcostal window. Image acquisition challenging due to patient body habitus and Image acquisition challenging due to respiratory motion. IMPRESSIONS  1. Left ventricular ejection fraction, by estimation, is 25 to 30%. The left ventricle has severely decreased function. The left ventricle demonstrates global hypokinesis that appears worse in the septal segments. There is moderate concentric left ventricular hypertrophy. Left ventricular diastolic parameters are indeterminate.  2. Right ventricular systolic function is severely reduced. The right ventricular size is normal. PASP is 39.37mHg +RAP.  3. Left atrial size was severely dilated.  4. Right atrial size was severely dilated.  5. The mitral valve is degenerative. Mild mitral valve regurgitation. Moderate to severe mitral annular calcification.  6. The aortic valve has been repaired/replaced. Aortic valve regurgitation is trivial. There is a 29 mm Medtronic valve present in the aortic position. Procedure Date: 04/28/21. Mean gradient 1.466mg with Vmax 0.76m41min the setting of low output state. LVOT VTI 12.4.  7. Aortic dilatation noted. There is mild dilatation of the ascending aorta, measuring 40 mm. Comparison(s): Compared to prior TTE on 05/2022, the LVEF has decreased to 25-30% with continued septal wall motion abnormalites. FINDINGS  Left Ventricle: Left ventricular ejection fraction, by estimation, is 25 to 30%. The left ventricle has severely decreased function. There is global hypokinesis that appears worse in the septal segments. The left ventricular internal cavity size was normal in size. There is moderate concentric left ventricular hypertrophy. Left ventricular diastolic parameters are indeterminate. Right Ventricle: The right ventricular size is normal. No increase in right ventricular wall thickness. Right ventricular systolic function is severely reduced. Left  Atrium: Left atrial size was severely dilated. Right Atrium: Right atrial size was severely  dilated. Pericardium: There is no evidence of pericardial effusion. Mitral Valve: The mitral valve is degenerative in appearance. There is moderate thickening of the mitral valve leaflet(s). There is mild calcification of the mitral valve leaflet(s). Moderate to severe mitral annular calcification. Mild mitral valve regurgitation. Tricuspid Valve: The tricuspid valve is normal in structure. Tricuspid valve regurgitation is trivial. Aortic Valve: The aortic valve has been repaired/replaced. Aortic valve regurgitation is trivial. There is a 29 mm Medtronic valve present in the aortic position. Procedure Date: 04/28/21. Pulmonic Valve: The pulmonic valve was normal in structure. Pulmonic valve regurgitation is trivial. Aorta: Aortic dilatation noted. There is mild dilatation of the ascending aorta, measuring 40 mm. IAS/Shunts: The atrial septum is grossly normal.  LEFT VENTRICLE PLAX 2D LVIDd:         4.20 cm      Diastology LVIDs:         3.30 cm      LV e' medial:  3.37 cm/s LV PW:         1.60 cm      LV e' lateral: 5.15 cm/s LV IVS:        1.30 cm LVOT diam:     2.20 cm LV SV:         37 LV SV Index:   20 LVOT Area:     3.80 cm  LV Volumes (MOD) LV vol d, MOD A2C: 88.8 ml LV vol d, MOD A4C: 102.0 ml LV vol s, MOD A2C: 54.1 ml LV vol s, MOD A4C: 71.5 ml LV SV MOD A2C:     34.7 ml LV SV MOD A4C:     102.0 ml LV SV MOD BP:      34.6 ml RIGHT VENTRICLE RV S prime:     4.67 cm/s TAPSE (M-mode): 0.9 cm LEFT ATRIUM              Index        RIGHT ATRIUM           Index LA diam:        4.50 cm  2.48 cm/m   RA Area:     28.80 cm LA Vol (A2C):   115.0 ml 63.33 ml/m  RA Volume:   97.00 ml  53.42 ml/m LA Vol (A4C):   103.0 ml 56.73 ml/m LA Biplane Vol: 110.0 ml 60.58 ml/m  AORTIC VALVE             PULMONIC VALVE LVOT Vmax:   64.60 cm/s  PR End Diast Vel: 9.86 msec LVOT Vmean:  41.300 cm/s LVOT VTI:    0.098 m  AORTA Ao Root diam:  3.40 cm Ao Asc diam:  4.10 cm MITRAL VALVE             TRICUSPID VALVE MV Area (PHT): 4.15 cm  TR Peak grad:   39.4 mmHg MV Decel Time: 183 msec  TR Vmax:        314.00 cm/s                           SHUNTS                          Systemic VTI:  0.10 m                          Systemic Diam: 2.20 cm Gwyndolyn Kaufman MD Electronically signed  by Gwyndolyn Kaufman MD Signature Date/Time: 10/13/2022/5:05:06 PM    Final     Cardiac Studies  TTE 10/13/2022  1. Left ventricular ejection fraction, by estimation, is 25 to 30%. The  left ventricle has severely decreased function. The left ventricle  demonstrates global hypokinesis that appears worse in the septal segments.  There is moderate concentric left  ventricular hypertrophy. Left ventricular diastolic parameters are  indeterminate.   2. Right ventricular systolic function is severely reduced. The right  ventricular size is normal. PASP is 39.28mHg +RAP.   3. Left atrial size was severely dilated.   4. Right atrial size was severely dilated.   5. The mitral valve is degenerative. Mild mitral valve regurgitation.  Moderate to severe mitral annular calcification.   6. The aortic valve has been repaired/replaced. Aortic valve  regurgitation is trivial. There is a 29 mm Medtronic valve present in the  aortic position. Procedure Date: 04/28/21. Mean gradient 1.471mg with Vmax  0.17m20min the setting of low output state.  LVOT VTI 12.4.   7. Aortic dilatation noted. There is mild dilatation of the ascending  aorta, measuring 40 mm.   Chest CT 10/14/2022 IMPRESSION: 1. Stable emphysematous changes and areas of pulmonary scarring. 2. Significant areas of ground-glass opacity most notably in the right middle lobe and right upper lobe. This is typically seen with partial airspace filling process such as edema, inflammations/alveolitis. It could also be seen with constrictive bronchiolitis, hypersensitivity pneumonitis or cryptogenic organizing  pneumonia. 3. Stable area of rounded atelectasis at the left lung base. 4. Stable advanced vascular disease. 5. No mediastinal or hilar mass or adenopathy. Stable scattered calcified and noncalcified nodes. 6. Stable surgical changes from TAVR and coronary artery bypass surgery.  Patient Profile  [age]ar old male with history of CAD status post CABG (multiple interventions to vein grafts and native CAD), left bundle branch block, persistent atrial fibrillation on Eliquis, systolic heart failure EF 35 to 40%, CKD, diabetes, hypertension, advanced age who was admitted on 10/13/2022 for weakness and fatigue. Cardiology was consulted for bradycardia.    Assessment & Plan   # Acute systolic heart failure, EF 25 to 30% -Sodium improved with diuresis.  Does not appear that volume up but BNP is elevated. -Will continue with diuresis for now.  Lasix 60 mg IV twice daily. -No beta-blocker due to bradycardia. -Continue Aldactone 25 mg daily. -Not a candidate for ACE/ARB/ARNI given CKD. -Would benefit from Imdur and hydralazine.  Add as blood pressure tolerates.  We will hold this today given diuresis.  # Atrial fibrillation with slow ventricular response # Sinus pauses -Had significant sinus pauses on admission.  He was on a beta-blocker.  This is being held.  Telemetry shows stable A-fib rhythm.  No indications for pacing at this time. -Continue Eliquis. -If he has bradycardia or has difficulties with participation with PT can reevaluate need for pacemaker therapy.  For now we will continue with conservative approach.  #AKI #CKD IIIb -Kidney function has improved.  It is stable.  # Influenza A/COVID-19 infection #Pneumonia -Chest CT with possible constrictive bronchiolitis versus pneumonitis versus cryptogenic organizing pneumonia.  I will defer treatment of this to the hospital medicine team.  May benefit from pulmonary evaluation.  # Hyponatremia -Improving with diuresis.  #  Thrombocytopenia -Thought to be related to infection.  Okay to continue Eliquis for now.   For questions or updates, please contact [employer] consult [email or web address] for contact info under   Signed, WesLake Bells O'Neal,  MD, [place]  10/15/2022 9:53 AM

## 2022-10-15 NOTE — TOC Progression Note (Signed)
Transition of Care [facility]) - Progression Note    Patient Details  Name: [name] MRN: [id number] Date of Birth: 03/09/1933  Transition of Care [facility]) CM/SW Contact  Beckey Rutter, MSW, [place] Phone Number: 10/15/2022, 4:33 PM  Clinical Narrative:  CSW spoke with pt daughter and they would like to choose [facility]. However, pt stated if [facility] offeres a bed they would rather accept that one instead. CSW reached out to [facility] and is awaiting a response.     Expected Discharge Plan: [place] Barriers to Discharge: Continued Medical Work up  Expected Discharge Plan and Services       Living arrangements for the past 2 months: Single Family Home                                       Social Determinants of Health (SDOH) Interventions SDOH Screenings   Food Insecurity: No Food Insecurity (08/26/2022)  Housing: [place]  (03/02/2021)  Transportation Needs: No Transportation Needs (08/26/2022)  Alcohol Screen: Low Risk  (03/02/2021)  Depression (PHQ2-9): High Risk (10/01/2022)  Financial Resource Strain: Low Risk  (03/29/2022)  Physical Activity: Inactive (03/29/2022)  Social Connections: Socially Integrated (03/02/2021)  Stress: No Stress Concern Present (03/29/2022)  Tobacco Use: Medium Risk (10/13/2022)    Readmission Risk Interventions    02/08/2022   10:44 AM  Readmission Risk Prevention Plan  Transportation Screening Complete  PCP or Specialist Appt within 3-5 Days Complete  HRI or [place] Complete  Social Work Consult for [place] Planning/Counseling [place] Not Applicable  Medication Review Press photographer) Complete   Beckey Rutter, MSW, LCSWA, LCASA Transitions of Care  Clinical Social Worker I

## 2022-10-15 NOTE — TOC Progression Note (Signed)
Transition of Care [facility]) - Progression Note    Patient Details  Name: [name] MRN: [id number] Date of Birth: [birth year]  Transition of Care [facility]) CM/SW Contact  Beckey Rutter, MSW, [place] Phone Number: 10/15/2022, 12:00 PM  Clinical Narrative:  CSW provided pt with list of two SNF offers. Family had initially decided to go visit facility first before making a decision. CSW informed pt that insurance auth would be needed before dc and encouraged family to make a decision soon. Family has now decided to take pt home with home health PT.    Expected Discharge Plan: [place] Barriers to Discharge: Continued Medical Work up  Expected Discharge Plan and Services       Living arrangements for the past 2 months: Single Family Home                                       Social Determinants of Health (SDOH) Interventions SDOH Screenings   Food Insecurity: No Food Insecurity (08/26/2022)  Housing: [place]  (03/02/2021)  Transportation Needs: No Transportation Needs (08/26/2022)  Alcohol Screen: Low Risk  (03/02/2021)  Depression (PHQ2-9): High Risk (10/01/2022)  Financial Resource Strain: Low Risk  (03/29/2022)  Physical Activity: Inactive (03/29/2022)  Social Connections: Socially Integrated (03/02/2021)  Stress: No Stress Concern Present (03/29/2022)  Tobacco Use: Medium Risk (10/13/2022)    Readmission Risk Interventions    02/08/2022   10:44 AM  Readmission Risk Prevention Plan  Transportation Screening Complete  PCP or Specialist Appt within 3-5 Days Complete  HRI or [place] Complete  Social Work Consult for [place] Planning/Counseling [place] Not Applicable  Medication Review Press photographer) Complete   Beckey Rutter, MSW, LCSWA, LCASA Transitions of Care  Clinical Social Worker I

## 2022-10-15 NOTE — Progress Notes (Signed)
  Progress Note   Patient: [name] INO:[id number] DOB: Jan 28, 1933 DOA: 10/13/2022     1 DOS: the patient was seen and examined on 10/15/2022 at 12:09PM      Brief hospital course: [name] is an 88 y.o. M with pAF on Eliquis, CAD s/p CABG and multiple PCI last >80yr DM, HTN, HLD, sdCHF EF now 25-30%, CKD IIIb baseline 1.7-2.0, COVID earlier this month, and prosCA who presented with weakness and fatigue.  In the ER, influenza A positive.   12/27: Admitted on Tamiflu, Cards consulted 12/28: CT chest ordered     Assessment and Plan: * Influenza A Procalcitonin so far low CT chest appears to be consistent with recent COVID and influenza, with some possible superimposed pulmonary edema. - Continue Tamiflu - Continue pulmonary toilet - Trend procalcitonin   Symptomatic bradycardia   Gross hematuria Urinalysis no red cells, urine culture no growth.  Thrombocytopenia (HCC) Platelets trending up, likely due to influenza  Hyponatremia Sodium improving - Hold SSRI - Diuretics - Trend sodium  Anemia   Type 2 diabetes mellitus with hyperlipidemia (HCC) -Continue glargine and sliding scale corrections  Stage 3b chronic kidney disease (CKD) (HCC) Baseline 1.7-2, creatinine stable relative to baseline.  Acute kidney injury ruled out  Chronic systolic CHF (congestive heart failure) (HCC) -Continue diuretics per cardiology   Persistent atrial fibrillation (HCC) -Continue Eliquis - Hold carvedilol  Pure hypercholesterolemia - Continue Crestor  Coronary artery disease of native artery of native heart with stable angina pectoris (HCC) -Continue Eliquis and Plavix per cardiology - Continue Crestor - Continue spironolactone - Hold carvedilol  Hypertension Blood pressure controlled - Continue furosemide, spironolactone          Subjective: Still extremely weak, confusion is improved no respiratory symptoms, no swelling     Physical Exam: BP (!)  110/54   Pulse 73   Temp (!) 97.5 F (36.4 C) (Oral)   Resp 16   Ht '5\' 9"'$  (1.753 m)   Wt 63.1 kg   SpO2 98%   BMI 20.54 kg/m   Thin elderly male, lying in bed, appears severely debilitated RRR, no murmurs, no peripheral edema Respiratory rate normal, lungs clear without rales or wheezes Abdomen soft no tenderness palpation or guarding, no ascites or distention  Data Reviewed: Procalcitonin 0.12 Platelets 88 K White blood cell count normal Sodium up to 120 Creatinine down to 1.7  Family Communication: Wife and son at the bedside    Disposition: Status is: Inpatient         Author: CEdwin Dada MD 10/15/2022 6:53 PM  For on call review [email or web address]

## 2022-10-16 DIAGNOSIS — I4819 Other persistent atrial fibrillation: Secondary | ICD-10-CM | POA: Diagnosis not present

## 2022-10-16 DIAGNOSIS — I5022 Chronic systolic (congestive) heart failure: Secondary | ICD-10-CM | POA: Diagnosis not present

## 2022-10-16 DIAGNOSIS — I25118 Atherosclerotic heart disease of native coronary artery with other forms of angina pectoris: Secondary | ICD-10-CM | POA: Diagnosis not present

## 2022-10-16 DIAGNOSIS — N1832 Chronic kidney disease, stage 3b: Secondary | ICD-10-CM | POA: Diagnosis not present

## 2022-10-16 LAB — CBC
HCT: 38.2 % — ABNORMAL LOW (ref 39.0–52.0)
Hemoglobin: 12.6 g/dL — ABNORMAL LOW (ref 13.0–17.0)
MCH: 26.4 pg (ref 26.0–34.0)
MCHC: 33 g/dL (ref 30.0–36.0)
MCV: 80.1 fL (ref 80.0–100.0)
Platelets: 102 10*3/uL — ABNORMAL LOW (ref 150–400)
RBC: 4.77 MIL/uL (ref 4.22–5.81)
RDW: 18.5 % — ABNORMAL HIGH (ref 11.5–15.5)
WBC: 10.9 10*3/uL — ABNORMAL HIGH (ref 4.0–10.5)
nRBC: 0 % (ref 0.0–0.2)

## 2022-10-16 LAB — PROCALCITONIN: Procalcitonin: <0.1 ng/mL

## 2022-10-16 LAB — GLUCOSE, CAPILLARY
Glucose-Capillary: 196 mg/dL — ABNORMAL HIGH (ref 70–99)
Glucose-Capillary: 179 mg/dL — ABNORMAL HIGH (ref 70–99)
Glucose-Capillary: 146 mg/dL — ABNORMAL HIGH (ref 70–99)
Glucose-Capillary: 175 mg/dL — ABNORMAL HIGH (ref 70–99)

## 2022-10-16 LAB — BASIC METABOLIC PANEL
Anion gap: 11 (ref 5–15)
BUN: 35 mg/dL — ABNORMAL HIGH (ref 8–23)
CO2: 22 mmol/L (ref 22–32)
Calcium: 9.5 mg/dL (ref 8.9–10.3)
Chloride: 94 mmol/L — ABNORMAL LOW (ref 98–111)
Creatinine, Ser: 1.72 mg/dL — ABNORMAL HIGH (ref 0.61–1.24)
GFR, Estimated: 38 mL/min — ABNORMAL LOW (ref >60)
Glucose, Bld: 182 mg/dL — ABNORMAL HIGH (ref 70–99)
Potassium: 4.2 mmol/L (ref 3.5–5.1)
Sodium: 127 mmol/L — ABNORMAL LOW (ref 135–145)

## 2022-10-16 MED ORDER — GUAIFENESIN-DM 100-10 MG/5ML PO SYRP
5.0000 mL | ORAL_SOLUTION | ORAL | Status: DC | PRN
  Administered 2022-10-16 – 2022-10-25 (×12): 5 mL via ORAL
  Filled 2022-10-16 (×13): qty 5

## 2022-10-16 NOTE — Progress Notes (Addendum)
Cardiology Progress Note  Patient ID: [name] MRN: [id number] DOB: 29-Apr-1933 Date of Encounter: 10/16/2022  Primary Cardiologist: [name], MD  Subjective   Chief Complaint: SOB  HPI: Creatinine stable.  On O2 satting well. Laying in bed, weak  ROS:  All other ROS reviewed and negative. Pertinent positives noted in the HPI.     Inpatient Medications  Scheduled Meds:  apixaban  2.5 mg Oral BID   clopidogrel  75 mg Oral Daily   docusate sodium  100 mg Oral BID   feeding supplement (GLUCERNA SHAKE)  237 mL Oral TID BM   insulin aspart  0-15 Units Subcutaneous TID WC   insulin glargine-yfgn  10 Units Subcutaneous QPM   mometasone-formoterol  2 puff Inhalation BID   multivitamin  1 tablet Oral QHS   nicotine  14 mg Transdermal Daily   oseltamivir  30 mg Oral Daily   pantoprazole  40 mg Oral Daily   rosuvastatin  20 mg Oral Daily   sodium chloride flush  3 mL Intravenous Q12H   spironolactone  25 mg Oral Daily   Continuous Infusions:  PRN Meds: acetaminophen **OR** acetaminophen, albuterol, bisacodyl, guaiFENesin, hydrALAZINE, melatonin, ondansetron **OR** ondansetron (ZOFRAN) IV, oxyCODONE, polyethylene glycol   Vital Signs   Vitals:   10/16/22 0600 10/16/22 0803 10/16/22 0855 10/16/22 1138  BP:  111/74  131/61  Pulse: 68 70  67  Resp: 11 (!) 26  14  Temp:  98.3 F (36.8 C)  98 F (36.7 C)  TempSrc:  Oral  Oral  SpO2: 98% 97% 100% 98%  Weight:      Height:        Intake/Output Summary (Last 24 hours) at 10/16/2022 1201 Last data filed at 10/15/2022 2004 Gross per 24 hour  Intake --  Output 550 ml  Net -550 ml      10/16/2022    5:11 AM 10/15/2022    4:49 AM 10/13/2022    3:26 PM  Last 3 Weights  Weight (lbs) 145 lb 4.5 oz 139 lb 1.6 oz 140 lb 6.9 oz  Weight (kg) 65.9 kg 63.095 kg 63.7 kg      Telemetry  Afib, some PVCs  Physical Exam   Vitals:   10/16/22 0600 10/16/22 0803 10/16/22 0855 10/16/22 1138  BP:  111/74  131/61  Pulse:  68 70  67  Resp: 11 (!) 26  14  Temp:  98.3 F (36.8 C)  98 F (36.7 C)  TempSrc:  Oral  Oral  SpO2: 98% 97% 100% 98%  Weight:      Height:        Intake/Output Summary (Last 24 hours) at 10/16/2022 1201 Last data filed at 10/15/2022 2004 Gross per 24 hour  Intake --  Output 550 ml  Net -550 ml       10/16/2022    5:11 AM 10/15/2022    4:49 AM 10/13/2022    3:26 PM  Last 3 Weights  Weight (lbs) 145 lb 4.5 oz 139 lb 1.6 oz 140 lb 6.9 oz  Weight (kg) 65.9 kg 63.095 kg 63.7 kg    Body mass index is 21.45 kg/m.  General: Well nourished, well developed, in no acute distress. Dry mucous membranes Head: Atraumatic, normal size  Eyes: PEERLA, EOMI  Neck: Supple, no JVD Endocrine: No thryomegaly Cardiac: Normal S1, S2; irregular rhythm, no murmurs Lungs: coarse BS BL Abd: Soft, nontender, no hepatomegaly  Ext: No edema, pulses 2+ Musculoskeletal: No LE edema Skin: Warm and  dry, no rashes   Neuro: Alert and oriented to person, place, time, and situation, CNII-XII grossly intact, no focal deficits  Psych: Normal mood and affect   Labs  High Sensitivity Troponin:   Recent Labs  Lab 09/20/22 0849 09/20/22 1302 10/13/22 0412 10/13/22 0645 10/13/22 1616  TROPONINIHS 59* 54* 43* 45* 53*     Cardiac EnzymesNo results for input(s): "TROPONINI" in the last 168 hours. No results for input(s): "TROPIPOC" in the last 168 hours.  Chemistry Recent Labs  Lab 10/14/22 0303 10/15/22 0205 10/16/22 0043  NA 126* 128* 127*  K 4.7 5.1 4.2  CL 99 96* 94*  CO2 18* 19* 22  GLUCOSE 81 132* 182*  BUN 34* 33* 35*  CREATININE 1.79* 1.70* 1.72*  CALCIUM 9.0 9.5 9.5  GFRNONAA 36* 39* 38*  ANIONGAP '9 13 11    '$ Hematology Recent Labs  Lab 10/14/22 0303 10/15/22 0205 10/16/22 0043  WBC 10.2 9.7 10.9*  RBC 4.35 4.81 4.77  HGB 11.6* 13.0 12.6*  HCT 35.6* 39.2 38.2*  MCV 81.8 81.5 80.1  MCH 26.7 27.0 26.4  MCHC 32.6 33.2 33.0  RDW 18.0* 18.6* 18.5*  PLT 75* 82* 102*    BNP Recent Labs  Lab 10/13/22 1616  BNP 1,046.9*    DDimer No results for input(s): "DDIMER" in the last 168 hours.   Radiology  CT CHEST WO CONTRAST  Result Date: 10/14/2022 CLINICAL DATA:  Pneumonia. Pleural effusion. EXAM: CT CHEST WITHOUT CONTRAST TECHNIQUE: Multidetector CT imaging of the chest was performed following the standard protocol without IV contrast. RADIATION DOSE REDUCTION: This exam was performed according to the departmental dose-optimization program which includes automated exposure control, adjustment of the mA and/or kV according to patient size and/or use of iterative reconstruction technique. COMPARISON:  09/02/2021 FINDINGS: Cardiovascular: The heart is upper limits of normal in size for the patient's age. No pericardial effusion. Stable surgical changes from TAVR. Stable surgical changes from coronary artery bypass surgery. Stable aortic and extensive three-vessel coronary artery calcifications. Mediastinum/Nodes: Stable scattered calcified and noncalcified mediastinal and hilar lymph nodes likely related to remote granulomas disease. No mass or overt adenopathy the. The esophagus is grossly. Lungs/Pleura: Stable underlying emphysematous changes and areas of pulmonary scarring. Significant areas of ground-glass opacity most notably in the right middle lobe and right upper lobe. This is typically seen with partial airspace filling process such as edema, inflammations/alveolitis. It could also be seen with constrictive bronchiolitis, hypersensitivity pneumonitis or cryptogenic organizing pneumonia. Stable area of rounded atelectasis at the left lung base. Moderate bibasilar atelectasis. No pleural effusions or pulmonary edema. Upper Abdomen: No significant upper abdominal findings. Stable advanced vascular disease. Musculoskeletal: No chest wall mass, supraclavicular or axillary adenopathy. The bony thorax is intact. IMPRESSION: 1. Stable emphysematous changes and areas of  pulmonary scarring. 2. Significant areas of ground-glass opacity most notably in the right middle lobe and right upper lobe. This is typically seen with partial airspace filling process such as edema, inflammations/alveolitis. It could also be seen with constrictive bronchiolitis, hypersensitivity pneumonitis or cryptogenic organizing pneumonia. 3. Stable area of rounded atelectasis at the left lung base. 4. Stable advanced vascular disease. 5. No mediastinal or hilar mass or adenopathy. Stable scattered calcified and noncalcified nodes. 6. Stable surgical changes from TAVR and coronary artery bypass surgery. Aortic Atherosclerosis (ICD10-I70.0) and Emphysema (ICD10-J43.9). Electronically Signed   By: Marijo Sanes M.D.   On: 10/14/2022 14:06    Cardiac Studies  TTE 10/13/2022  1. Left ventricular ejection fraction, by  estimation, is 25 to 30%. The  left ventricle has severely decreased function. The left ventricle  demonstrates global hypokinesis that appears worse in the septal segments.  There is moderate concentric left  ventricular hypertrophy. Left ventricular diastolic parameters are  indeterminate.   2. Right ventricular systolic function is severely reduced. The right  ventricular size is normal. PASP is 39.54mHg +RAP.   3. Left atrial size was severely dilated.   4. Right atrial size was severely dilated.   5. The mitral valve is degenerative. Mild mitral valve regurgitation.  Moderate to severe mitral annular calcification.   6. The aortic valve has been repaired/replaced. Aortic valve  regurgitation is trivial. There is a 29 mm Medtronic valve present in the  aortic position. Procedure Date: 04/28/21. Mean gradient 1.4316mg with Vmax  0.16m32min the setting of low output state.  LVOT VTI 12.4.   7. Aortic dilatation noted. There is mild dilatation of the ascending  aorta, measuring 40 mm.   Chest CT 10/14/2022 IMPRESSION: 1. Stable emphysematous changes and areas of pulmonary  scarring. 2. Significant areas of ground-glass opacity most notably in the right middle lobe and right upper lobe. This is typically seen with partial airspace filling process such as edema, inflammations/alveolitis. It could also be seen with constrictive bronchiolitis, hypersensitivity pneumonitis or cryptogenic organizing pneumonia. 3. Stable area of rounded atelectasis at the left lung base. 4. Stable advanced vascular disease. 5. No mediastinal or hilar mass or adenopathy. Stable scattered calcified and noncalcified nodes. 6. Stable surgical changes from TAVR and coronary artery bypass surgery.  Patient Profile  89 30ar old male with history of CAD status post CABG (multiple interventions to vein grafts and native CAD), left bundle Meral Geissinger block, persistent atrial fibrillation on Eliquis, systolic heart failure EF 35 to 40%, CKD, diabetes, hypertension, advanced age who was admitted on 10/13/2022 for weakness and fatigue. Cardiology was consulted for bradycardia.    Assessment & Plan   # Acute systolic heart failure, EF 25 to 30% -Sodium improved with diuresis.  Does not appear that volume up but BNP is elevated. Na stable in the setting of chronic CHF.  - euvolemic to dry; will stop IV diuresis -s/p Lasix 60 mg IV twice daily. -stop  beta-blocker due to bradycardia. -Continue Aldactone 25 mg daily. -Not a candidate for ACE/ARB/ARNI given CKD. -Would benefit from Imdur and hydralazine.  Add as blood pressure tolerates.  We will hold this today given diuresis.  # Atrial fibrillation with slow ventricular response # Sinus pauses -Had significant sinus pauses on admission.  He was on a beta-blocker.  This is being held.  Telemetry shows stable A-fib rhythm.  No indications for pacing at this time. No recurrent significant sinus pauses -Continue Eliquis. -If he has bradycardia or has difficulties with participation with PT can reevaluate need for pacemaker therapy.  For now we will  continue with conservative approach.  #AKI #CKD IIIb -Kidney function has improved.  It is stable.  # Influenza A/COVID-19 infection #Pneumonia -Chest CT with possible constrictive bronchiolitis versus pneumonitis versus cryptogenic organizing pneumonia.  I will defer treatment of this to the hospital medicine team.  May benefit from pulmonary evaluation.  # Hyponatremia -stable  # Thrombocytopenia -Thought to be related to infection.  Okay to continue Eliquis for now.   Can follow peripherally over the weekend   Time Spent Directly with Patient:  I have spent a total of 35 minutes with the patient reviewing hospital notes, telemetry, EKGs, labs and examining the patient  as well as establishing an assessment and plan that was discussed personally with the patient.  > 50% of time was spent in direct patient care.   For questions or updates, please contact [place] Please consult [email or web address] for contact info under

## 2022-10-16 NOTE — Progress Notes (Signed)
Occupational Therapy Treatment Patient Details Name: [name] MRN: [id number] DOB: 06-02-1933 Today's Date: 10/16/2022   History of present illness Patient 88 y.o. who presented pm 12/27 with weakness and bradycardia, + flu. Pt with recent  PNA, RSV and COVID 19.PMH includes HTN, HLD, PAF on Eliquis, CHF, CAD s/p CABG, DM type II, prostate cancer.   OT comments  Pt with slow progressing towards goals, limited by dizziness and soft BP this session, RN notified. Pt needing set up -max A for seated/standing ADLs, min A for bed mobility and min A for sit to stand transfer with RW. Pt able to stand x2 for bed linen change and take 2-3 lateral steps to R side (HOB) before needing to sit. Pt presenting with impairments listed below, will follow acutely. Continue to recommend SNF at d/c.  BP seated EOB prior to standing: 104/47 (65) BP seated EOB after standing: 95/76 (84) BP supine (120/62 (78)   Recommendations for follow up therapy are one component of a multi-disciplinary discharge planning process, led by the attending physician.  Recommendations may be updated based on patient status, additional functional criteria and insurance authorization.    Follow Up Recommendations  Skilled nursing-short term rehab (<3 hours/day)     Assistance Recommended at Discharge Frequent or constant Supervision/Assistance  Patient can return home with the following  A lot of help with walking and/or transfers;A lot of help with bathing/dressing/bathroom;Assistance with cooking/housework;Assistance with feeding;Direct supervision/assist for medications management;Direct supervision/assist for financial management;Assist for transportation;Help with stairs or ramp for entrance   Equipment Recommendations  Other (comment) (defer)    Recommendations for Other Services      Precautions / Restrictions Precautions Precautions: Fall Precaution Comments: watch BP Restrictions Weight Bearing Restrictions: No        Mobility Bed Mobility Overal bed mobility: Needs Assistance Bed Mobility: Supine to Sit, Sit to Supine     Supine to sit: Min assist Sit to supine: Min assist        Transfers Overall transfer level: Needs assistance Equipment used: Rolling walker (2 wheels) Transfers: Sit to/from Stand Sit to Stand: Min assist           General transfer comment: stand x2, able to take some lateral side steps toward R side, unable to get to chair due to dizziness/needing to sit     Balance Overall balance assessment: Needs assistance Sitting-balance support: Feet supported Sitting balance-Leahy Scale: Fair     Standing balance support: Reliant on assistive device for balance Standing balance-Leahy Scale: Poor Standing balance comment: reliant on RW support                           ADL either performed or assessed with clinical judgement   ADL Overall ADL's : Needs assistance/impaired Eating/Feeding: Set up   Grooming: Set up   Upper Body Bathing: Moderate assistance   Lower Body Bathing: Maximal assistance   Upper Body Dressing : Moderate assistance   Lower Body Dressing: Maximal assistance   Toilet Transfer: Minimal assistance   Toileting- Clothing Manipulation and Hygiene: Maximal assistance       Functional mobility during ADLs: Minimal assistance;Rolling walker (2 wheels) General ADL Comments: fatigues easily    Extremity/Trunk Assessment Upper Extremity Assessment Upper Extremity Assessment: Generalized weakness   Lower Extremity Assessment Lower Extremity Assessment: Defer to PT evaluation        Vision   Vision Assessment?: No apparent visual deficits   Perception Perception  Perception: Not tested   Praxis Praxis Praxis: Not tested    Cognition Arousal/Alertness: Awake/alert Behavior During Therapy: WFL for tasks assessed/performed Overall Cognitive Status: Impaired/Different from baseline Area of Impairment: Memory,  Following commands, Awareness, Problem solving                     Memory: Decreased short-term memory Following Commands: Follows one step commands with increased time   Awareness: Emergent Problem Solving: Slow processing, Requires verbal cues, Requires tactile cues, Decreased initiation General Comments: decreased verbalization, questionable cognition vs HOH        Exercises      Shoulder Instructions       General Comments soft BP, see note. spouse present and supportive during session    Pertinent Vitals/ Pain       Pain Assessment Pain Assessment: No/denies pain  Home Living                                          Prior Functioning/Environment              Frequency  Min 2X/week        Progress Toward Goals  OT Goals(current goals can now be found in the care plan section)  Progress towards OT goals: Progressing toward goals  Acute Rehab OT Goals Patient Stated Goal: to get better OT Goal Formulation: With patient Time For Goal Achievement: 10/28/22 Potential to Achieve Goals: Good ADL Goals Pt Will Perform Grooming: with min guard assist;standing Pt Will Perform Upper Body Bathing: with supervision;sitting Pt Will Perform Upper Body Dressing: with supervision;sitting Pt Will Transfer to Toilet: with min guard assist;ambulating;bedside commode Additional ADL Goal #1: Pt will perform bed mobility modified independently in preparation for ADLs. Additional ADL Goal #2: Pt will recall at least 3 energy conservation strategies as instructed.  Plan Discharge plan remains appropriate    Co-evaluation                 AM-PAC OT "6 Clicks" Daily Activity     Outcome Measure   Help from another person eating meals?: A Little Help from another person taking care of personal grooming?: A Little Help from another person toileting, which includes using toliet, bedpan, or urinal?: A Lot Help from another person bathing  (including washing, rinsing, drying)?: A Lot Help from another person to put on and taking off regular upper body clothing?: A Lot Help from another person to put on and taking off regular lower body clothing?: A Lot 6 Click Score: 14    End of Session Equipment Utilized During Treatment: Rolling walker (2 wheels);Oxygen  OT Visit Diagnosis: Unsteadiness on feet (R26.81);Other abnormalities of gait and mobility (R26.89);Muscle weakness (generalized) (M62.81);Other symptoms and signs involving cognitive function   Activity Tolerance Patient limited by fatigue   Patient Left in bed;with call bell/phone within reach;with bed alarm set;with family/visitor present   Nurse Communication Mobility status;Other (comment) (soft BP, pt needs bed linen change)        Time: 3546-5681 OT Time Calculation (min): 30 min  Charges: OT General Charges $OT Visit: 1 Visit OT Treatments $Self Care/Home Management : 8-22 mins $Therapeutic Activity: 8-22 mins  Renaye Rakers, OTD, OTR/L SecureChat Preferred Acute Rehab [phone]   Renaye Rakers [place] 10/16/2022, 9:58 AM

## 2022-10-16 NOTE — Progress Notes (Signed)
   10/16/22 1543  Respiratory  Respiratory Interventions Incentive spirometry w/ teach back (& flutter valve.)  Incentive Spirometry  IS Goal (mL) (RN or RT) 750 mL  IS - Achieved (mL) (RN, NT, or RT) 750 mL  IS - # of Times (RN or NT) 10  IS Effort (RN) Satisfactory

## 2022-10-16 NOTE — Progress Notes (Signed)
  Progress Note   Patient: [name] [id number]:[id number] DOB: May 19, 1933 DOA: 10/13/2022     2 DOS: the patient was seen and examined on 10/16/2022        Brief hospital course: [name] is an 88 y.o. M with pAF on Eliquis, CAD s/p CABG and multiple PCI last >51yr DM, HTN, HLD, sdCHF EF now 25-30%, CKD IIIb baseline 1.7-2.0, COVID earlier this month, and prosCA who presented with weakness and fatigue.  In the ER, influenza A positive.   12/27: Admitted on Tamiflu, Cards consulted 12/28: CT chest ordered     Assessment and Plan: * Influenza A Procalcitonin so far low CT chest appears to be consistent with recent COVID and influenza, with some possible superimposed pulmonary edema. - Continue Tamiflu - Continue pulmonary toilet - Trend procalcitonin    Hyponatremia Unchanged - Fluid restriction - Stop diuretics    Type 2 diabetes mellitus with hyperlipidemia (HCC) -Continue glargine and sliding scale corrections    Chronic systolic CHF (congestive heart failure) (HCC) - Hold further diuretics   Persistent atrial fibrillation (HCC) -Continue Eliquis - Hold carvedilol    Coronary artery disease of native artery of native heart with stable angina pectoris (HCC) Hypertension -Continue Eliquis and Plavix per cardiology - Continue Crestor - Continue spironolactone - Hold carvedilol           Subjective: No change     Physical Exam: BP (!) 112/58 (BP Location: Right Arm)   Pulse 63   Temp (!) 97.5 F (36.4 C) (Oral)   Resp 18   Ht '5\' 9"'$  (1.753 m)   Wt 65.9 kg   SpO2 96%   BMI 21.45 kg/m   Thin elderly male, lying in bed, appears severely debilitated RRR, no murmurs, no peripheral edema Respiratory rate normal, lungs clear without rales or wheezes Abdomen soft no tenderness palpation or guarding, no ascites or distention  Data Reviewed: Procalcitonin 0.12 Platelets 88 K White blood cell count normal Sodium up to 120 Creatinine down to  1.7  Family Communication: Wife at the bedside    Disposition: Status is: Inpatient         Author: CEdwin Dada MD 10/16/2022 7:33 PM  For on call review [email or web address]

## 2022-10-17 DIAGNOSIS — I5022 Chronic systolic (congestive) heart failure: Secondary | ICD-10-CM | POA: Diagnosis not present

## 2022-10-17 DIAGNOSIS — I4819 Other persistent atrial fibrillation: Secondary | ICD-10-CM | POA: Diagnosis not present

## 2022-10-17 DIAGNOSIS — N1832 Chronic kidney disease, stage 3b: Secondary | ICD-10-CM | POA: Diagnosis not present

## 2022-10-17 DIAGNOSIS — I25118 Atherosclerotic heart disease of native coronary artery with other forms of angina pectoris: Secondary | ICD-10-CM | POA: Diagnosis not present

## 2022-10-17 LAB — BASIC METABOLIC PANEL
Anion gap: 9 (ref 5–15)
BUN: 38 mg/dL — ABNORMAL HIGH (ref 8–23)
CO2: 20 mmol/L — ABNORMAL LOW (ref 22–32)
Calcium: 9.3 mg/dL (ref 8.9–10.3)
Chloride: 96 mmol/L — ABNORMAL LOW (ref 98–111)
Creatinine, Ser: 1.78 mg/dL — ABNORMAL HIGH (ref 0.61–1.24)
GFR, Estimated: 36 mL/min — ABNORMAL LOW (ref >60)
Glucose, Bld: 149 mg/dL — ABNORMAL HIGH (ref 70–99)
Potassium: 4.7 mmol/L (ref 3.5–5.1)
Sodium: 125 mmol/L — ABNORMAL LOW (ref 135–145)

## 2022-10-17 LAB — CBC
HCT: 36.9 % — ABNORMAL LOW (ref 39.0–52.0)
Hemoglobin: 12.2 g/dL — ABNORMAL LOW (ref 13.0–17.0)
MCH: 26.7 pg (ref 26.0–34.0)
MCHC: 33.1 g/dL (ref 30.0–36.0)
MCV: 80.7 fL (ref 80.0–100.0)
Platelets: 126 10*3/uL — ABNORMAL LOW (ref 150–400)
RBC: 4.57 MIL/uL (ref 4.22–5.81)
RDW: 18.6 % — ABNORMAL HIGH (ref 11.5–15.5)
WBC: 11.1 10*3/uL — ABNORMAL HIGH (ref 4.0–10.5)
nRBC: 0 % (ref 0.0–0.2)

## 2022-10-17 LAB — GLUCOSE, CAPILLARY
Glucose-Capillary: 135 mg/dL — ABNORMAL HIGH (ref 70–99)
Glucose-Capillary: 164 mg/dL — ABNORMAL HIGH (ref 70–99)
Glucose-Capillary: 162 mg/dL — ABNORMAL HIGH (ref 70–99)
Glucose-Capillary: 171 mg/dL — ABNORMAL HIGH (ref 70–99)

## 2022-10-17 LAB — OSMOLALITY, URINE: Osmolality, Ur: 326 mOsm/kg (ref 300–900)

## 2022-10-17 NOTE — Progress Notes (Signed)
MD, pt's HR dropped to 33, no s/s, pt is in Afib and was on a Beta Blocker at home, On call MD notified, will continue to monitor, Thanks Arvella Nigh .

## 2022-10-17 NOTE — TOC Progression Note (Signed)
Transition of Care [facility]) - Progression Note    Patient Details  Name: [name] MRN: [id number] Date of Birth: [birth year]  Transition of Care [facility]) CM/SW Contact  Emeterio Reeve, [place] Phone Number: 10/17/2022, 12:59 PM  Clinical Narrative:     CSW called [facility] to inquire about bed. CSW was informed that there is not an admissions coordinator working today. LCSW left voice message asking for a return call to [place].   Expected Discharge Plan: [place] Barriers to Discharge: Continued Medical Work up  Expected Discharge Plan and Services       Living arrangements for the past 2 months: Single Family Home                                       Social Determinants of Health (SDOH) Interventions SDOH Screenings   Food Insecurity: No Food Insecurity (08/26/2022)  Housing: [place]  (03/02/2021)  Transportation Needs: No Transportation Needs (08/26/2022)  Alcohol Screen: Low Risk  (03/02/2021)  Depression (PHQ2-9): High Risk (10/01/2022)  Financial Resource Strain: Low Risk  (03/29/2022)  Physical Activity: Inactive (03/29/2022)  Social Connections: Socially Integrated (03/02/2021)  Stress: No Stress Concern Present (03/29/2022)  Tobacco Use: Medium Risk (10/13/2022)    Readmission Risk Interventions    02/08/2022   10:44 AM  Readmission Risk Prevention Plan  Transportation Screening Complete  PCP or Specialist Appt within 3-5 Days Complete  HRI or [place] Complete  Social Work Consult for [place] Planning/Counseling Complete  Palliative Care Screening Not Applicable  Medication Review [employer]) Complete

## 2022-10-17 NOTE — Progress Notes (Signed)
  Progress Note   Patient: [name] [id number]:[id number] DOB: 03-24-1933 DOA: 10/13/2022     3 DOS: the patient was seen and examined on 10/17/2022        Brief hospital course: [name] is an 88 y.o. M with pAF on Eliquis, CAD s/p CABG and multiple PCI last >36yr DM, HTN, HLD, sdCHF EF now 25-30%, CKD IIIb baseline 1.7-2.0, COVID earlier this month, and prosCA who presented with weakness and fatigue.  In the ER, influenza A positive.   12/27: Admitted on Tamiflu, Cards consulted 12/28: CT chest ordered     Assessment and Plan: * Influenza A - Continue tamiflu day 4 of 5 - Continue pulmonary toilet    Bradycardia Resolved  Hyponatremia No improvement, appears dry now  - Push fluids - Trend BMP  Type 2 diabetes mellitus with hyperlipidemia (HCC) Glucose normal -Continue glargine and sliding scale corrections    Chronic systolic CHF (congestive heart failure) (HCC) - Hold further diuretics   Persistent atrial fibrillation (HCC) -Continue Eliquis and Plavix - Hold carvedilol    Coronary artery disease of native artery of native heart with stable angina pectoris (HCC) Hypertension -Continue Eliquis and Plavix per cardiology - Continue Crestor - Continue spironolactone - Hold carvedilol           Subjective: Gradualy getting stronger, no new fever, no confusion, no new respiratory symptoms     Physical Exam: BP 116/72 (BP Location: Right Arm)   Pulse 82   Temp 97.8 F (36.6 C) (Oral)   Resp 20   Ht '5\' 9"'$  (1.753 m)   Wt 62.9 kg   SpO2 98%   BMI 20.48 kg/m   Thin elderly male, lying in bed, appears severely debilitated RRR, no murmurs, no peripheral edema Respiratory rate normal, lungs clear without rales or wheezes Abdomen soft no tenderness palpation or guarding, no ascites or distention  Data Reviewed: Na 125, Cr 1.7 no change CBC normal  Family Communication: Daughter  at the bedside    Disposition: Status is:  Inpatient         Author: CEdwin Dada MD 10/17/2022 7:29 PM  For on call review [email or web address]

## 2022-10-18 DIAGNOSIS — J101 Influenza due to other identified influenza virus with other respiratory manifestations: Secondary | ICD-10-CM | POA: Diagnosis not present

## 2022-10-18 LAB — CULTURE, BLOOD (ROUTINE X 2)
Culture: NO GROWTH
Culture: NO GROWTH
Special Requests: ADEQUATE

## 2022-10-18 LAB — OSMOLALITY: Osmolality: 533 mOsm/kg (ref 275–295)

## 2022-10-18 LAB — BASIC METABOLIC PANEL
Anion gap: 11 (ref 5–15)
BUN: 35 mg/dL — ABNORMAL HIGH (ref 8–23)
CO2: 19 mmol/L — ABNORMAL LOW (ref 22–32)
Calcium: 9.7 mg/dL (ref 8.9–10.3)
Chloride: 94 mmol/L — ABNORMAL LOW (ref 98–111)
Creatinine, Ser: 1.75 mg/dL — ABNORMAL HIGH (ref 0.61–1.24)
GFR, Estimated: 37 mL/min — ABNORMAL LOW (ref >60)
Glucose, Bld: 123 mg/dL — ABNORMAL HIGH (ref 70–99)
Potassium: 4.7 mmol/L (ref 3.5–5.1)
Sodium: 124 mmol/L — ABNORMAL LOW (ref 135–145)

## 2022-10-18 LAB — OSMOLALITY, URINE: Osmolality, Ur: 277 mOsm/kg — ABNORMAL LOW (ref 300–900)

## 2022-10-18 LAB — GLUCOSE, CAPILLARY
Glucose-Capillary: 126 mg/dL — ABNORMAL HIGH (ref 70–99)
Glucose-Capillary: 167 mg/dL — ABNORMAL HIGH (ref 70–99)
Glucose-Capillary: 175 mg/dL — ABNORMAL HIGH (ref 70–99)

## 2022-10-18 LAB — SODIUM, URINE, RANDOM: Sodium, Ur: 29 mmol/L

## 2022-10-18 MED ORDER — AMLODIPINE BESYLATE 5 MG PO TABS
5.0000 mg | ORAL_TABLET | Freq: Every day | ORAL | Status: DC
  Administered 2022-10-19 – 2022-10-25 (×7): 5 mg via ORAL
  Filled 2022-10-18 (×7): qty 1

## 2022-10-18 MED ORDER — SODIUM CHLORIDE 1 G PO TABS
1.0000 g | ORAL_TABLET | Freq: Two times a day (BID) | ORAL | Status: DC
  Administered 2022-10-18 – 2022-10-20 (×4): 1 g via ORAL
  Filled 2022-10-18 (×4): qty 1

## 2022-10-18 NOTE — Progress Notes (Signed)
Progress Note  Patient Name: [name] Date of Encounter: 10/18/2022  Primary Cardiologist:   [name], MD   Subjective   No acute complaints.  Difficulty hearing and somewhat confused.  Very weak.   Inpatient Medications    Scheduled Meds:  apixaban  2.5 mg Oral BID   clopidogrel  75 mg Oral Daily   docusate sodium  100 mg Oral BID   feeding supplement (GLUCERNA SHAKE)  237 mL Oral TID BM   insulin aspart  0-15 Units Subcutaneous TID WC   insulin glargine-yfgn  10 Units Subcutaneous QPM   mometasone-formoterol  2 puff Inhalation BID   multivitamin  1 tablet Oral QHS   nicotine  14 mg Transdermal Daily   oseltamivir  30 mg Oral Daily   pantoprazole  40 mg Oral Daily   rosuvastatin  20 mg Oral Daily   sodium chloride flush  3 mL Intravenous Q12H   Continuous Infusions:  PRN Meds: acetaminophen **OR** acetaminophen, albuterol, bisacodyl, guaiFENesin, guaiFENesin-dextromethorphan, hydrALAZINE, melatonin, ondansetron **OR** ondansetron (ZOFRAN) IV, oxyCODONE, polyethylene glycol   Vital Signs    Vitals:   10/18/22 0100 10/18/22 0447 10/18/22 0700 10/18/22 0856  BP:  134/83    Pulse:  62  63  Resp:  18  (!) 21  Temp:   99.1 F (37.3 C)   TempSrc:  Oral Rectal   SpO2:  91%  93%  Weight: 62.1 kg     Height:        Intake/Output Summary (Last 24 hours) at 10/18/2022 0858 Last data filed at 10/18/2022 0500 Gross per 24 hour  Intake 300 ml  Output 600 ml  Net -300 ml   Filed Weights   10/16/22 0511 10/17/22 0249 10/18/22 0100  Weight: 65.9 kg 62.9 kg 62.1 kg    Telemetry    Atrial fib with slow rate and PVCs - Personally Reviewed  ECG    NA - Personally Reviewed  Physical Exam   GEN:   Chronically ill appearing Neck: No  JVD Cardiac: Irregular RR, distant heart sounds Respiratory:     Decreased breath sounds bilaterally with coarse crackles GI: Soft, nontender, non-distended  MS: No  edema; No deformity. Neuro:  Nonfocal  Psych: Normal  affect   Labs    Chemistry Recent Labs  Lab 10/16/22 0043 10/17/22 0041 10/18/22 0050  NA 127* 125* 124*  K 4.2 4.7 4.7  CL 94* 96* 94*  CO2 22 20* 19*  GLUCOSE 182* 149* 123*  BUN 35* 38* 35*  CREATININE 1.72* 1.78* 1.75*  CALCIUM 9.5 9.3 9.7  GFRNONAA 38* 36* 37*  ANIONGAP '11 9 11     '$ Hematology Recent Labs  Lab 10/15/22 0205 10/16/22 0043 10/17/22 0041  WBC 9.7 10.9* 11.1*  RBC 4.81 4.77 4.57  HGB 13.0 12.6* 12.2*  HCT 39.2 38.2* 36.9*  MCV 81.5 80.1 80.7  MCH 27.0 26.4 26.7  MCHC 33.2 33.0 33.1  RDW 18.6* 18.5* 18.6*  PLT 82* 102* 126*    Cardiac EnzymesNo results for input(s): "TROPONINI" in the last 168 hours. No results for input(s): "TROPIPOC" in the last 168 hours.   BNP Recent Labs  Lab 10/13/22 1616  BNP 1,046.9*     DDimer No results for input(s): "DDIMER" in the last 168 hours.   Radiology    No results found.  Cardiac Studies   TTE 10/13/2022  1. Left ventricular ejection fraction, by estimation, is 25 to 30%. The  left ventricle has severely decreased  function. The left ventricle  demonstrates global hypokinesis that appears worse in the septal segments.  There is moderate concentric left  ventricular hypertrophy. Left ventricular diastolic parameters are  indeterminate.   2. Right ventricular systolic function is severely reduced. The right  ventricular size is normal. PASP is 39.71mHg +RAP.   3. Left atrial size was severely dilated.   4. Right atrial size was severely dilated.   5. The mitral valve is degenerative. Mild mitral valve regurgitation.  Moderate to severe mitral annular calcification.   6. The aortic valve has been repaired/replaced. Aortic valve  regurgitation is trivial. There is a 29 mm Medtronic valve present in the  aortic position. Procedure Date: 04/28/21. Mean gradient 1.422mg with Vmax  0.15m43min the setting of low output state.  LVOT VTI 12.4.   7. Aortic dilatation noted. There is mild dilatation of the  ascending  aorta, measuring 40 mm.    Chest CT 10/14/2022 IMPRESSION: 1. Stable emphysematous changes and areas of pulmonary scarring. 2. Significant areas of ground-glass opacity most notably in the right middle lobe and right upper lobe. This is typically seen with partial airspace filling process such as edema, inflammations/alveolitis. It could also be seen with constrictive bronchiolitis, hypersensitivity pneumonitis or cryptogenic organizing pneumonia. 3. Stable area of rounded atelectasis at the left lung base. 4. Stable advanced vascular disease. 5. No mediastinal or hilar mass or adenopathy. Stable scattered calcified and noncalcified nodes. 6. Stable surgical changes from TAVR and coronary artery bypass surgery.    Patient Profile     89 19o. male with history of CAD status post CABG (multiple interventions to vein grafts and native CAD), left bundle branch block, persistent atrial fibrillation on Eliquis, systolic heart failure EF 35 to 40%, CKD, diabetes, hypertension, advanced age who was admitted on 10/13/2022 for weakness and fatigue. Cardiology was consulted for bradycardia.     Assessment & Plan    Acute systolic HF:   Intake and output is incomplete.    Na continues to be very low but stable.  No evidence of volume overload.  Holding diuretics.     Atrial fib:  On Eliquis.    Holding beta blocker with bradycardia.    Hyponatremia:  Low and unchanged from previous.  Will follow.  Portends poor prognosis.  I am not sure that his goals of life or code status have been discussed with his wife.  I will confer with primary team.     CKD IIIb:  Creat is stable.      For questions or updates, please contact [facility] consult [email or web address] for contact info under Cardiology/STEMI.   Signed, JamMinus BreedingD  10/18/2022, 8:58 AM

## 2022-10-18 NOTE — Progress Notes (Signed)
Mobility Specialist Progress Note    10/18/22 1614  Mobility  Activity Stood at bedside  Level of Assistance Moderate assist, patient does 50-74%  Assistive Device Front wheel walker  Activity Response Tolerated fair  Mobility Referral Yes  $Mobility charge 1 Mobility   Pre-Mobility: 61 HR, 91% SpO2 During Mobility: 60 HR Post-Mobility: 53 HR, 137/63 (57) BP, 93% SpO2  Pt received in bed and agreeable. Pt modA for bed mobility. Once sitting EOB pt c/o dizziness. BP 131/53 (72). Took an extended seated rest break. Pt minA to stand but wanted to sit immediately stating he felt like he "was going to go out". Returned to sitting EOB and BP 116/52 (71). Returned to supine and left with call bell in reach. RN notified.   Hildred Alamin Mobility Specialist  Please Psychologist, sport and exercise or Rehab Office at [phone]

## 2022-10-18 NOTE — Progress Notes (Signed)
Progress Note   Patient: [name] BDZ:[id number] DOB: 09/05/1933 DOA: 10/13/2022     4 DOS: the patient was seen and examined on 10/18/2022 at 12:01PM      Brief hospital course: Mr. Carothers is an 89 y.o. M with pAF on Eliquis, CAD s/p CABG and multiple PCI last >81yr DM, HTN, HLD, sdCHF EF now 25-30%, CKD IIIb baseline 1.7-2.0, COVID earlier this month, and prosCA who presented with weakness and fatigue.  In the ER, influenza A positive.   12/27: Admitted on Tamiflu, Cards consulted 12/28: CT chest ordered 12/29-12/31: Clinically improving 1/1: More delirious     Assessment and Plan: * Influenza A Completed 5 days Tamiflu.  Procalcitonin low and stayed low.  Unfortunately, the patient has been quite debilitated and anergic/asthenic with his illness.  Oral intake low, mobility poor.  This is contributing to delirium and failure to thrive.    - Aggressive pulmonary toilet     Symptomatic bradycardia Cardiology were consulted, BB stopped.  No further work up recommended. - Stop Coreg  Gross hematuria UA and culture negative. Observed by family not nursing.  Thrombocytopenia ([place] down to 75,000, due to influenza.  Now normalized.  Hyponatremia Appears euvolemic to hypervolemic.  Symptomatic with weakness and slight confusion.  I think this is multifactorial with SIADH contributing.  Previous urine studies confounded by diuretics.   - Repeat serum and urine osmolality, urine sodium - Trend sodium - Hold SSRI - Start salt tabs for water excretion  Anemia Hemoglobin stable   Type 2 diabetes mellitus with hyperlipidemia (HCC) Glucose controlled - Continue glargine - Continue sliding scale corrections - Hold Januvia  Stage 3b chronic kidney disease (CKD) (HCC) Baseline 1.7-2, creatinine stable relative to baseline.  Acute kidney injury ruled out  Chronic systolic CHF (congestive heart failure) (HCC) No pitting edema or JVD but slightly more thirdspacing  today. - Hold diuretics for now   Persistent atrial fibrillation (HCC) Rate controlled - Continue Eliquis - Hold carvedilol  Pure hypercholesterolemia - Continue Crestor  Coronary artery disease of native artery of native heart with stable angina pectoris (HCC) - Continue Eliquis, Crestor - Hold carvedilol, spironolactone  Hypertension BP elevated - Resume amlodipine - Hold spironolactone, Coreg, torsemide          Subjective: More confused and weak today.  No pain complaints.  More puffy today.  Afebrile but patient's family reports he seems warm.     Physical Exam: BP (!) 166/53 (BP Location: Right Arm)   Pulse (!) 55   Temp (!) 97.5 F (36.4 C) (Oral)   Resp 16   Ht '5\' 9"'$  (1.753 m)   Wt 62.1 kg   SpO2 98%   BMI 20.22 kg/m   Elderly adult male, appears debilitated and weak Heart rate slow, regular, diffuse nonpitting edema, no JVD Respiratory rate normal, effort shallow, coarse crackles bilaterally, clear with further breathing Abdomen soft without tenderness palpation or guarding, no ascites or distention, no guarding in any quadrants Attention distracted, affect blunted, disoriented today, face metric, speech fluent, severe generalized weakness    Data Reviewed: Discussed with cardiology Creatinine stable at 1.7 Sodium down to 124 CBC normalized  Family Communication: Wife and daughter at the bedside.  Reviewed clinical course to this point, he is failing to thrive, I am concerned that his overall prognosis is poor.  We reviewed CODE STATUS, wife will discuss with daughter and son.  Then updated treatment team     Disposition: Status is: Inpatient Patient  is clinically failure to thrive.  Will start salt tabs subjective improved sodium, encourage mobilization and pulmonary toilet  If he does not have clinical improvement over the next few days, we will need to review the possibility of discharge with hospice versus to rehab with palliative  following        Author: Edwin Dada, MD 10/18/2022 5:22 PM  For on call review [email or web address].

## 2022-10-19 ENCOUNTER — Inpatient Hospital Stay (HOSPITAL_COMMUNITY): Payer: Medicare PPO

## 2022-10-19 ENCOUNTER — Inpatient Hospital Stay: Payer: Self-pay

## 2022-10-19 DIAGNOSIS — I4729 Other ventricular tachycardia: Secondary | ICD-10-CM | POA: Insufficient documentation

## 2022-10-19 DIAGNOSIS — J101 Influenza due to other identified influenza virus with other respiratory manifestations: Secondary | ICD-10-CM | POA: Diagnosis not present

## 2022-10-19 DIAGNOSIS — L8990 Pressure ulcer of unspecified site, unspecified stage: Secondary | ICD-10-CM | POA: Insufficient documentation

## 2022-10-19 DIAGNOSIS — R627 Adult failure to thrive: Secondary | ICD-10-CM

## 2022-10-19 LAB — CBC
HCT: 37.7 % — ABNORMAL LOW (ref 39.0–52.0)
Hemoglobin: 12.4 g/dL — ABNORMAL LOW (ref 13.0–17.0)
MCH: 27 pg (ref 26.0–34.0)
MCHC: 32.9 g/dL (ref 30.0–36.0)
MCV: 82 fL (ref 80.0–100.0)
Platelets: 155 10*3/uL (ref 150–400)
RBC: 4.6 MIL/uL (ref 4.22–5.81)
RDW: 18.8 % — ABNORMAL HIGH (ref 11.5–15.5)
WBC: 11.8 10*3/uL — ABNORMAL HIGH (ref 4.0–10.5)
nRBC: 0 % (ref 0.0–0.2)

## 2022-10-19 LAB — GLUCOSE, CAPILLARY
Glucose-Capillary: 122 mg/dL — ABNORMAL HIGH (ref 70–99)
Glucose-Capillary: 188 mg/dL — ABNORMAL HIGH (ref 70–99)
Glucose-Capillary: 302 mg/dL — ABNORMAL HIGH (ref 70–99)
Glucose-Capillary: 186 mg/dL — ABNORMAL HIGH (ref 70–99)

## 2022-10-19 LAB — MAGNESIUM: Magnesium: 2 mg/dL (ref 1.7–2.4)

## 2022-10-19 LAB — BASIC METABOLIC PANEL
Anion gap: 8 (ref 5–15)
Anion gap: 13 (ref 5–15)
BUN: 35 mg/dL — ABNORMAL HIGH (ref 8–23)
BUN: 36 mg/dL — ABNORMAL HIGH (ref 8–23)
CO2: 19 mmol/L — ABNORMAL LOW (ref 22–32)
CO2: 17 mmol/L — ABNORMAL LOW (ref 22–32)
Calcium: 9.9 mg/dL (ref 8.9–10.3)
Calcium: 10.1 mg/dL (ref 8.9–10.3)
Chloride: 95 mmol/L — ABNORMAL LOW (ref 98–111)
Chloride: 94 mmol/L — ABNORMAL LOW (ref 98–111)
Creatinine, Ser: 1.82 mg/dL — ABNORMAL HIGH (ref 0.61–1.24)
Creatinine, Ser: 1.9 mg/dL — ABNORMAL HIGH (ref 0.61–1.24)
GFR, Estimated: 36 mL/min — ABNORMAL LOW (ref >60)
GFR, Estimated: 34 mL/min — ABNORMAL LOW (ref >60)
Glucose, Bld: 136 mg/dL — ABNORMAL HIGH (ref 70–99)
Glucose, Bld: 139 mg/dL — ABNORMAL HIGH (ref 70–99)
Potassium: 5.1 mmol/L (ref 3.5–5.1)
Potassium: 5.3 mmol/L — ABNORMAL HIGH (ref 3.5–5.1)
Sodium: 122 mmol/L — ABNORMAL LOW (ref 135–145)
Sodium: 124 mmol/L — ABNORMAL LOW (ref 135–145)

## 2022-10-19 LAB — LIPID PANEL
Cholesterol: 66 mg/dL (ref 0–200)
HDL: 28 mg/dL — ABNORMAL LOW (ref >40)
LDL Cholesterol: 26 mg/dL (ref 0–99)
Total CHOL/HDL Ratio: 2.4 RATIO
Triglycerides: 61 mg/dL (ref <150)
VLDL: 12 mg/dL (ref 0–40)

## 2022-10-19 LAB — HEPATIC FUNCTION PANEL
ALT: 30 U/L (ref 0–44)
AST: 22 U/L (ref 15–41)
Albumin: 2.4 g/dL — ABNORMAL LOW (ref 3.5–5.0)
Alkaline Phosphatase: 132 U/L — ABNORMAL HIGH (ref 38–126)
Bilirubin, Direct: 0.3 mg/dL — ABNORMAL HIGH (ref 0.0–0.2)
Indirect Bilirubin: 0.7 mg/dL (ref 0.3–0.9)
Total Bilirubin: 1 mg/dL (ref 0.3–1.2)
Total Protein: 6.9 g/dL (ref 6.5–8.1)

## 2022-10-19 LAB — SODIUM, URINE, RANDOM: Sodium, Ur: 31 mmol/L

## 2022-10-19 MED ORDER — CHLORHEXIDINE GLUCONATE CLOTH 2 % EX PADS
6.0000 | MEDICATED_PAD | Freq: Every day | CUTANEOUS | Status: DC
  Administered 2022-10-19 – 2022-10-24 (×6): 6 via TOPICAL

## 2022-10-19 NOTE — Progress Notes (Signed)
Per clarification of PICC tip, recommended to pull back 1-2cm and re-xray.

## 2022-10-19 NOTE — Progress Notes (Signed)
Heart Failure Navigator Progress Note  Assessed for Heart & Vascular TOC clinic readiness.  Patient does not meet criteria due to EF % 25-30. , poor mobility, delirium, FTT. Per MD note if no improvement over the next couple of days will transition to Hospice care.   Navigator will sign off at this time.  Earnestine Leys, BSN, Clinical cytogeneticist Only

## 2022-10-19 NOTE — Assessment & Plan Note (Signed)
Brief run of 12 beats VT today, asymptomatic. - Check mag - Keep mag>2, K>4

## 2022-10-19 NOTE — Progress Notes (Signed)
PT Cancellation Note  Patient Details Name: [name] MRN: [id number] DOB: 03-May-1933   Cancelled Treatment:    Reason Eval/Treat Not Completed: Other (comment).   Tired after MT worked with him and declining PT.  Follow up as time and pt allow.   Ramond Dial 10/19/2022, 12:20 PM  Mee Hives, PT PhD Acute Rehab Dept. Number: [place]

## 2022-10-19 NOTE — Progress Notes (Signed)
Peripherally Inserted Central Catheter Placement  The IV Nurse has discussed with the patient and/or persons authorized to consent for the patient, the purpose of this procedure and the potential benefits and risks involved with this procedure.  The benefits include less needle sticks, lab draws from the catheter, and the patient may be discharged home with the catheter. Risks include, but not limited to, infection, bleeding, blood clot (thrombus formation), and puncture of an artery; nerve damage and irregular heartbeat and possibility to perform a PICC exchange if needed/ordered by physician.  Alternatives to this procedure were also discussed.  Bard Power PICC patient education guide, fact sheet on infection prevention and patient information card has been provided to patient /or left at bedside. Son at bedside and consented to PICC placement.   PICC Placement Documentation  PICC Double Lumen 10/19/22 Right Brachial 37 cm 0 cm (Active)  Indication for Insertion or Continuance of Line Vasoactive infusions 10/19/22 1458  Exposed Catheter (cm) 0 cm 10/19/22 1458  Site Assessment Clean, Dry, Intact 10/19/22 1458  Lumen #1 Status Flushed;Saline locked;Blood return noted 10/19/22 1458  Lumen #2 Status Flushed;Saline locked;Blood return noted 10/19/22 1458  Dressing Type Transparent;Securing device 10/19/22 1458  Dressing Status Antimicrobial disc in place;Clean, Dry, Intact 10/19/22 1458  Safety Lock Not Applicable 47/09/62 8366  Line Care Connections checked and tightened 10/19/22 1458  Line Adjustment (NICU/IV Team Only) No 10/19/22 1458  Dressing Intervention New dressing 10/19/22 1458  Dressing Change Due 10/26/22 10/19/22 [place] 10/19/2022, 2:59 PM

## 2022-10-19 NOTE — Progress Notes (Signed)
Occupational Therapy Treatment Patient Details Name: [name] MRN: [id number] DOB: 16-Feb-1933 Today's Date: 10/19/2022   History of present illness Patient 89 y.o. who presented pm 12/27 with weakness and bradycardia, + flu. Pt with recent  PNA, RSV and COVID 19.PMH includes HTN, HLD, PAF on Eliquis, CHF, CAD s/p CABG, DM type II, prostate cancer.   OT comments  Patient lethargic this visit. Patient required increased time to follow directions due to fatigue and lethargic. Patients BP monitored during visit with 159/142 in supine, 131/56 on EOB, 131/72 following transfer to recliner and 145/55 at end of session. Patient more alert at end of session. Acute OT to continue to follow and discharge recommendations continue to be appropriate.    Recommendations for follow up therapy are one component of a multi-disciplinary discharge planning process, led by the attending physician.  Recommendations may be updated based on patient status, additional functional criteria and insurance authorization.    Follow Up Recommendations  Skilled nursing-short term rehab (<3 hours/day)     Assistance Recommended at Discharge Frequent or constant Supervision/Assistance  Patient can return home with the following  A lot of help with walking and/or transfers;A lot of help with bathing/dressing/bathroom;Assistance with cooking/housework;Assistance with feeding;Direct supervision/assist for medications management;Direct supervision/assist for financial management;Assist for transportation;Help with stairs or ramp for entrance   Equipment Recommendations  Other (comment) (defer)    Recommendations for Other Services      Precautions / Restrictions Precautions Precautions: Fall Precaution Comments: watch BP Restrictions Weight Bearing Restrictions: No       Mobility Bed Mobility Overal bed mobility: Needs Assistance Bed Mobility: Supine to Sit     Supine to sit: Min assist     General bed  mobility comments: verbal cues for rail use and min assist to raise trunk    Transfers Overall transfer level: Needs assistance Equipment used: Rolling walker (2 wheels) Transfers: Sit to/from Stand, Bed to chair/wheelchair/BSC Sit to Stand: Min assist     Step pivot transfers: Min assist     General transfer comment: min assist due to feeling dizzy, though he was falling     Balance Overall balance assessment: Needs assistance Sitting-balance support: Feet supported Sitting balance-Leahy Scale: Fair Sitting balance - Comments: able to perform grooming seated on EOB   Standing balance support: Reliant on assistive device for balance Standing balance-Leahy Scale: Poor Standing balance comment: reliant on RW support                           ADL either performed or assessed with clinical judgement   ADL Overall ADL's : Needs assistance/impaired     Grooming: Wash/dry hands;Wash/dry face;Oral care;Supervision/safety Grooming Details (indicate cue type and reason): performed seated on EOB with increased time due to increased time to follow directions                               General ADL Comments: fatigues easily    Extremity/Trunk Assessment Upper Extremity Assessment LUE Deficits / Details: chronic shoulder dysfunction LUE Sensation: WNL LUE Coordination: WNL            Vision       Perception     Praxis      Cognition Arousal/Alertness: Awake/alert, Lethargic Behavior During Therapy: WFL for tasks assessed/performed Overall Cognitive Status: Impaired/Different from baseline Area of Impairment: Memory, Following commands, Awareness, Problem solving  Orientation Level: Time, Situation   Memory: Decreased short-term memory Following Commands: Follows one step commands with increased time   Awareness: Emergent Problem Solving: Slow processing, Requires verbal cues, Requires tactile cues, Decreased  initiation General Comments: increased time to follow directions due to lethargic, possible due to meds        Exercises      Shoulder Instructions       General Comments SpO2 91, BP supine 159/142, EOB 131/53, following transfer to recliner 131/72, and 145/55 at end of session in recliner    Pertinent Vitals/ Pain       Pain Assessment Pain Assessment: No/denies pain  Home Living                                          Prior Functioning/Environment              Frequency  Min 2X/week        Progress Toward Goals  OT Goals(current goals can now be found in the care plan section)  Progress towards OT goals: Progressing toward goals  Acute Rehab OT Goals Patient Stated Goal: get better OT Goal Formulation: With patient Time For Goal Achievement: 10/28/22 Potential to Achieve Goals: Good ADL Goals Pt Will Perform Grooming: with min guard assist;standing Pt Will Perform Upper Body Bathing: with supervision;sitting Pt Will Perform Upper Body Dressing: with supervision;sitting Pt Will Transfer to Toilet: with min guard assist;ambulating;bedside commode Pt Will Perform Toileting - Clothing Manipulation and hygiene: with modified independence;sit to/from stand Additional ADL Goal #1: Pt will perform bed mobility modified independently in preparation for ADLs. Additional ADL Goal #2: Pt will recall at least 3 energy conservation strategies as instructed.  Plan Discharge plan remains appropriate    Co-evaluation                 AM-PAC OT "6 Clicks" Daily Activity     Outcome Measure   Help from another person eating meals?: A Little Help from another person taking care of personal grooming?: A Little Help from another person toileting, which includes using toliet, bedpan, or urinal?: A Lot Help from another person bathing (including washing, rinsing, drying)?: A Lot Help from another person to put on and taking off regular upper body  clothing?: A Lot Help from another person to put on and taking off regular lower body clothing?: A Lot 6 Click Score: 14    End of Session Equipment Utilized During Treatment: Rolling walker (2 wheels);Oxygen  OT Visit Diagnosis: Unsteadiness on feet (R26.81);Other abnormalities of gait and mobility (R26.89);Muscle weakness (generalized) (M62.81);Other symptoms and signs involving cognitive function   Activity Tolerance Patient limited by fatigue   Patient Left in chair;with call bell/phone within reach;with chair alarm set;with family/visitor present   Nurse Communication Mobility status        Time: 7408-1448 OT Time Calculation (min): 24 min  Charges: OT General Charges $OT Visit: 1 Visit OT Treatments $Self Care/Home Management : 23-37 mins  Lodema Hong, [place]  Office [place] 10/19/2022, 12:38 PM

## 2022-10-19 NOTE — Care Management Important Message (Signed)
Important Message  Patient Details  Name: [name] MRN: [id number] Date of Birth: 10/28/1932   Medicare Important Message Given:  Yes     Shelda Altes 10/19/2022, 10:53 AM

## 2022-10-19 NOTE — Assessment & Plan Note (Signed)
Not present on admission

## 2022-10-19 NOTE — Progress Notes (Signed)
Progress Note   Patient: [name] TDS:[id number] DOB: Sep 08, 1933 DOA: 10/13/2022     5 DOS: the patient was seen and examined on 10/19/2022        Brief hospital course: Mr. Line is an 89 y.o. M with pAF on Eliquis, CAD s/p CABG and multiple PCI last >72yr DM, HTN, HLD, sdCHF EF now 25-30%, CKD IIIb baseline 1.7-2.0, COVID earlier this month, and prosCA who presented with weakness and fatigue.  In the ER, influenza A positive.   12/27: Admitted on Tamiflu, Cards consulted 12/28: CT chest ordered 12/29-12/31: Clinically improving 1/1: More delirious 1/2: Na no improvement     Assessment and Plan: * Influenza A Completed 5 days Tamiflu.  Procalcitonin low and stayed low.  Unfortunately, the patient has been quite debilitated and anergic/asthenic with his illness.  Oral intake low, mobility poor.  This is contributing to delirium and failure to thrive.    - Aggressive pulmonary toilet     Failure to thrive in adult Unclear if this is purely from influenza shortly [place] in an 89y.o[place] man  Certainly there may be some heart failure and renal failure contributing (not in overt heart failure, and Cr stable since admission, close to baseline, but CrCl is low, 25)  Hyponatremia not helping. - See below  Hyponatremia Volume status hard to judge.  Unclear if he is symptomatic from low sodium or age and influenza and renal failure.   We think this is multifactorial with SIADH, cardiorenal syndrome contributing.  His oral intake is minimal, so free water restriction is not useful. - Check cortisol - Continue salt tabs (started Monday) for water excretion - Hold SSRI - Trend Na  - Cardiology and I discussed milrinone and tolvaptan; we will place PICC today and Dr. HPercival Spanishwill direct one or the other to try to improve his serum sodium over the next few days  NSVT (nonsustained ventricular tachycardia) ([place] Brief run of 12 beats VT today,  asymptomatic. - Check mag - Keep mag>2, K>4  Pressure injury of skin Not present on admission  Gross hematuria UA and culture negative. Never observed by medical team.  None reported since admission  Thrombocytopenia ([place] Platelets down to 75,000, due to influenza.  Now normalized.  Symptomatic bradycardia Cardiology were consulted, BB stopped.  No further work up recommended. - Stop Coreg  Anemia Hemoglobin stable   Type 2 diabetes mellitus with hyperlipidemia (HCC) Glucose controlled - Continue glargine - Continue sliding scale corrections - Hold Januvia  Stage 3b chronic kidney disease (CKD) (HCC) Baseline 1.7-2, creatinine stable relative to baseline.  Acute kidney injury ruled out  Chronic systolic CHF (congestive heart failure) (HCC) No pitting edema or JVD but but he does appear to be thirdspacing - Hold diuretics for now   Persistent atrial fibrillation (HCC) Rate controlled - Continue Eliquis - Hold carvedilol  Pure hypercholesterolemia - Continue Crestor  Coronary artery disease of native artery of native heart with stable angina pectoris (HCC) - Continue Eliquis, Crestor - Hold carvedilol, spironolactone  Hypertension BP normal - Continue amlodipine - Hold spironolactone, Coreg, torsemide          Subjective: Less confused today, but still very debilitated and weak     Physical Exam: BP 128/74 (BP Location: Left Arm)   Pulse (!) 54   Temp 97.6 F (36.4 C) (Oral)   Resp 20   Ht '5\' 9"'$  (1.753 m)   Wt 62.2 kg   SpO2 94%  BMI 20.25 kg/m   Elderly adult male, lying bed, no acute distress Respiratory rate seems slow but shallow, coarse throughout RRR, no murmurs, no peripheral edema Abdomen soft without tenderness palpation or guarding Attention diminished, affect blunted, severe generalized weakness Does not awake long enough to answer questions for me     Data Reviewed: Discussed with cardiology Basic metabolic panel shows  worsening hyponatremia Creatinine slightly up to 1.8 Osmolality and serum is elevated, I think she is.    Family Communication: Wife, son at the bedside    Disposition: Status is: Inpatient  Patient was admitted with influenza and congestive heart failure.  Despite treatment, the patient appears to be failing to thrive, mentation and function are worsening, oral intake is minimal.  Hyponatremia has not improved.  Discussed with cardiology today, we will place PICC line, and try to collect therapy with milrinone and/or tolvaptan in the coming days.  I had another frank disusion with family.  These treatments will take a few days to see if they work, and they may not work.    We discussed CODE STATUS and son (who is CPR trained) felt that his father and mother would not want this particular treamtent.  Will continue with aggressive cares, continue with escalating cares if needed but if CODE BLUE, no CPR/intubation/defibrillation.            Author: Edwin Dada, MD 10/19/2022 3:43 PM  For on call review [email or web address].

## 2022-10-19 NOTE — Progress Notes (Signed)
   10/19/22 1100  Mobility  Activity Transferred from chair to bed  Level of Assistance Moderate assist, patient does 50-74%  Assistive Device Front wheel walker  Distance Ambulated (ft) 2 ft  Activity Response Tolerated well  Mobility Referral Yes  $Mobility charge 1 Mobility   Mobility Specialist Progress Note  During Mobility: 55 HR  Pt was in bed and agreeable. Had no c/o pain throughout. Left in bed w/ all needs met and call bell in reach.   Lucious Groves Mobility Specialist  Please contact via SecureChat or Rehab office at [phone]

## 2022-10-19 NOTE — Progress Notes (Signed)
PT Cancellation Note  Patient Details Name: [name] MRN: [id number] DOB: January 17, 1933   Cancelled Treatment:    Reason Eval/Treat Not Completed: Other (comment).  In a sterile procedure, will retry as time and pt allow.   Ramond Dial 10/19/2022, 2:28 PM  Mee Hives, PT PhD Acute Rehab Dept. Number: [place] [facility] [place]

## 2022-10-19 NOTE — Assessment & Plan Note (Signed)
Unclear if this is purely from influenza shortly after recovering from [place] this winter, in an 89 y.o. man  Certainly there may be some heart failure and renal failure contributing (not in overt heart failure, and Cr stable since admission, close to baseline, but CrCl is low, 25)  Hyponatremia not helping. - See below

## 2022-10-19 NOTE — Progress Notes (Signed)
Progress Note  Patient Name: [name] Date of Encounter: 10/19/2022  Primary Cardiologist:   [name], MD   Subjective   Somnolent but wakes up.  Denies acute SOB.    Inpatient Medications    Scheduled Meds:  amLODipine  5 mg Oral Daily   apixaban  2.5 mg Oral BID   clopidogrel  75 mg Oral Daily   docusate sodium  100 mg Oral BID   feeding supplement (GLUCERNA SHAKE)  237 mL Oral TID BM   insulin aspart  0-15 Units Subcutaneous TID WC   insulin glargine-yfgn  10 Units Subcutaneous QPM   mometasone-formoterol  2 puff Inhalation BID   multivitamin  1 tablet Oral QHS   nicotine  14 mg Transdermal Daily   pantoprazole  40 mg Oral Daily   rosuvastatin  20 mg Oral Daily   sodium chloride flush  3 mL Intravenous Q12H   sodium chloride  1 g Oral BID WC   Continuous Infusions:  PRN Meds: acetaminophen **OR** acetaminophen, albuterol, bisacodyl, guaiFENesin, guaiFENesin-dextromethorphan, hydrALAZINE, melatonin, ondansetron **OR** ondansetron (ZOFRAN) IV, oxyCODONE, polyethylene glycol   Vital Signs    Vitals:   10/18/22 1015 10/18/22 1936 10/18/22 2030 10/19/22 0515  BP: (!) 166/53 (!) 134/47  (!) 153/44  Pulse: (!) 55 (!) 48 (!) 57 83  Resp: '16 17 20 19  '$ Temp: (!) 97.5 F (36.4 C) 98.6 F (37 C)  97.6 F (36.4 C)  TempSrc: Oral Oral  Oral  SpO2: 98% 92% 91% 92%  Weight:    62.2 kg  Height:        Intake/Output Summary (Last 24 hours) at 10/19/2022 0740 Last data filed at 10/19/2022 0524 Gross per 24 hour  Intake --  Output 700 ml  Net -700 ml   Filed Weights   10/17/22 0249 10/18/22 0100 10/19/22 0515  Weight: 62.9 kg 62.1 kg 62.2 kg    Telemetry    Atrial fib with slow ventricular - Personally Reviewed  ECG    NA - Personally Reviewed  Physical Exam   GEN:   Chronically ill appearing.  Neck: No  JVD Cardiac: Irregular RR, no murmurs, rubs, or gallops.  Distant heart sounds Respiratory:      Decreased breath sounds at bilateral bases.   GI: Soft, nontender, non-distended, normal bowel sounds  MS:  No edema; No deformity. Neuro:   Nonfocal  Psych:    Confused   Labs    Chemistry Recent Labs  Lab 10/17/22 0041 10/18/22 0050 10/19/22 0112  NA 125* 124* 122*  K 4.7 4.7 5.1  CL 96* 94* 95*  CO2 20* 19* 19*  GLUCOSE 149* 123* 136*  BUN 38* 35* 35*  CREATININE 1.78* 1.75* 1.82*  CALCIUM 9.3 9.7 9.9  GFRNONAA 36* 37* 36*  ANIONGAP '9 11 8     '$ Hematology Recent Labs  Lab 10/16/22 0043 10/17/22 0041 10/19/22 0112  WBC 10.9* 11.1* 11.8*  RBC 4.77 4.57 4.60  HGB 12.6* 12.2* 12.4*  HCT 38.2* 36.9* 37.7*  MCV 80.1 80.7 82.0  MCH 26.4 26.7 27.0  MCHC 33.0 33.1 32.9  RDW 18.5* 18.6* 18.8*  PLT 102* 126* 155    Cardiac EnzymesNo results for input(s): "TROPONINI" in the last 168 hours. No results for input(s): "TROPIPOC" in the last 168 hours.   BNP Recent Labs  Lab 10/13/22 1616  BNP 1,046.9*     DDimer No results for input(s): "DDIMER" in the last 168 hours.   Radiology  No results found.  Cardiac Studies   TTE 10/13/2022  1. Left ventricular ejection fraction, by estimation, is 25 to 30%. The  left ventricle has severely decreased function. The left ventricle  demonstrates global hypokinesis that appears worse in the septal segments.  There is moderate concentric left  ventricular hypertrophy. Left ventricular diastolic parameters are  indeterminate.   2. Right ventricular systolic function is severely reduced. The right  ventricular size is normal. PASP is 39.107mHg +RAP.   3. Left atrial size was severely dilated.   4. Right atrial size was severely dilated.   5. The mitral valve is degenerative. Mild mitral valve regurgitation.  Moderate to severe mitral annular calcification.   6. The aortic valve has been repaired/replaced. Aortic valve  regurgitation is trivial. There is a 29 mm Medtronic valve present in the  aortic position. Procedure Date: 04/28/21. Mean gradient 1.475mg with  Vmax  0.74m76min the setting of low output state.  LVOT VTI 12.4.   7. Aortic dilatation noted. There is mild dilatation of the ascending  aorta, measuring 40 mm.    Chest CT 10/14/2022 IMPRESSION: 1. Stable emphysematous changes and areas of pulmonary scarring. 2. Significant areas of ground-glass opacity most notably in the right middle lobe and right upper lobe. This is typically seen with partial airspace filling process such as edema, inflammations/alveolitis. It could also be seen with constrictive bronchiolitis, hypersensitivity pneumonitis or cryptogenic organizing pneumonia. 3. Stable area of rounded atelectasis at the left lung base. 4. Stable advanced vascular disease. 5. No mediastinal or hilar mass or adenopathy. Stable scattered calcified and noncalcified nodes. 6. Stable surgical changes from TAVR and coronary artery bypass surgery.    Patient Profile     89 23o. male with history of CAD status post CABG (multiple interventions to vein grafts and native CAD), left bundle branch block, persistent atrial fibrillation on Eliquis, systolic heart failure EF 35 to 40%, CKD, diabetes, hypertension, advanced age who was admitted on 10/13/2022 for weakness and fatigue. Cardiology was consulted for bradycardia.     Assessment & Plan    Acute systolic HF:   Intake and output is incomplete.    However appears to be decreased intake and output.   Diuretics held.    Hyponatremia:    Likely secondary to HF.  Unclear the component of SIADH. Not likely taking in too much free water so limited benefit with continued free water restriction.  Possible offending agents held. (Unlikely to be spiro but discontinued.)   Likely component of cardiorenal syndrome. He is receiving Na tablets again today.  If his serum Na is low and he does not respond to this therapy we could consider a trial of inotrope followed by a dose of Tolvaptan.      Atrial fib:  On Eliquis.    Holding beta blocker with  bradycardia.    CAD:  Remains on Plavix.  No evidence of bleeding issues.  Not thought to have any acute ischemia.    HTN:    Systolic BP has been up slightly.  Spironolactone has been held.  Significant dizziness and orthostatic BP drop with standing yesterday.  No med titration.    Status post TAVR:  Normal function this admission.    Hyponatremia:  As above.  CKD IIIb:  Creat is mildly trending up.    This however is not far from his baseline.    Disposition:  Discussed code status.  They are still considering DNR/DNI.  I have suggested at  least DNI.     For questions or updates, please contact [place] Please consult [email or web address] for contact info under Cardiology/STEMI.   Signed, [name], MD  10/19/2022, 7:40 AM

## 2022-10-19 NOTE — Progress Notes (Signed)
   10/19/22 1306  ECG Monitoring  Cardiac Rhythm VT (8-10 sec run ~ Sharmon Leyden RN notified)  Provider Notification  Provider Name/Title Dr. Loleta Books Ellard Artis with Cards notified via page)  Date Provider Notified 10/19/22  Time Provider Notified 1321  Method of Notification Face-to-face  Notification Reason New onset of dysrhythmia (12 sec VT)  Type of New Onset of Dysrhythmia Ventricular tachycardia  Symptoms of New Onset of Dysrhythmia Asymptomatic  Provider response No new orders  Date of Provider Response 10/19/22  Time of Provider Response 1321

## 2022-10-20 DIAGNOSIS — R531 Weakness: Secondary | ICD-10-CM | POA: Diagnosis not present

## 2022-10-20 DIAGNOSIS — J189 Pneumonia, unspecified organism: Secondary | ICD-10-CM

## 2022-10-20 DIAGNOSIS — R627 Adult failure to thrive: Secondary | ICD-10-CM

## 2022-10-20 DIAGNOSIS — J101 Influenza due to other identified influenza virus with other respiratory manifestations: Secondary | ICD-10-CM | POA: Diagnosis not present

## 2022-10-20 DIAGNOSIS — Z515 Encounter for palliative care: Secondary | ICD-10-CM

## 2022-10-20 LAB — URINALYSIS, COMPLETE (UACMP) WITH MICROSCOPIC
Bilirubin Urine: NEGATIVE
Glucose, UA: NEGATIVE mg/dL
Hgb urine dipstick: NEGATIVE
Ketones, ur: NEGATIVE mg/dL
Leukocytes,Ua: NEGATIVE
Nitrite: NEGATIVE
Protein, ur: 100 mg/dL — AB
Specific Gravity, Urine: 1.015 (ref 1.005–1.030)
pH: 5 (ref 5.0–8.0)

## 2022-10-20 LAB — GLUCOSE, CAPILLARY
Glucose-Capillary: 87 mg/dL (ref 70–99)
Glucose-Capillary: 116 mg/dL — ABNORMAL HIGH (ref 70–99)
Glucose-Capillary: 121 mg/dL — ABNORMAL HIGH (ref 70–99)
Glucose-Capillary: 139 mg/dL — ABNORMAL HIGH (ref 70–99)

## 2022-10-20 LAB — CORTISOL-AM, BLOOD: Cortisol - AM: 21.8 ug/dL (ref 6.7–22.6)

## 2022-10-20 LAB — BASIC METABOLIC PANEL
Anion gap: 9 (ref 5–15)
BUN: 37 mg/dL — ABNORMAL HIGH (ref 8–23)
CO2: 23 mmol/L (ref 22–32)
Calcium: 10.5 mg/dL — ABNORMAL HIGH (ref 8.9–10.3)
Chloride: 95 mmol/L — ABNORMAL LOW (ref 98–111)
Creatinine, Ser: 1.73 mg/dL — ABNORMAL HIGH (ref 0.61–1.24)
GFR, Estimated: 38 mL/min — ABNORMAL LOW (ref >60)
Glucose, Bld: 84 mg/dL (ref 70–99)
Potassium: 4.8 mmol/L (ref 3.5–5.1)
Sodium: 127 mmol/L — ABNORMAL LOW (ref 135–145)

## 2022-10-20 LAB — OSMOLALITY: Osmolality: 283 mOsm/kg (ref 275–295)

## 2022-10-20 LAB — CBC
HCT: 35.3 % — ABNORMAL LOW (ref 39.0–52.0)
Hemoglobin: 11.6 g/dL — ABNORMAL LOW (ref 13.0–17.0)
MCH: 26.9 pg (ref 26.0–34.0)
MCHC: 32.9 g/dL (ref 30.0–36.0)
MCV: 81.9 fL (ref 80.0–100.0)
Platelets: 172 10*3/uL (ref 150–400)
RBC: 4.31 MIL/uL (ref 4.22–5.81)
RDW: 18.7 % — ABNORMAL HIGH (ref 11.5–15.5)
WBC: 11.9 10*3/uL — ABNORMAL HIGH (ref 4.0–10.5)
nRBC: 0 % (ref 0.0–0.2)

## 2022-10-20 NOTE — Plan of Care (Signed)
  Problem: Clinical Measurements: Goal: Diagnostic test results will improve Outcome: Progressing Goal: Respiratory complications will improve Outcome: Progressing   Problem: Coping: Goal: Level of anxiety will decrease Outcome: Progressing   Problem: Safety: Goal: Ability to remain free from injury will improve Outcome: Progressing   

## 2022-10-20 NOTE — Progress Notes (Signed)
Physical Therapy Treatment Patient Details Name: [name] MRN: [id number] DOB: [birth year] Today's Date: 10/20/2022   History of Present Illness Patient 89 y.o. who presented pm 12/27 with weakness and bradycardia, + flu. Pt with recent  PNA, RSV and COVID 19.  PMH includes HTN, HLD, PAF on Eliquis, CHF, CAD s/p CABG, DM type II, prostate cancer.    PT Comments    Pt was seen for mobility to chair after being in bed for several days.  Talked with him about his goals for PT and noted BP was down from supine of 138/61 to 125/53 sitting.  Pt is agreeable to get to chair and talked with he and family about his need to sit up toward recovery of hypotension.  Has legs elevated and is comfortable, and will anticipate SNF recommendation to still be important to manage his control of balance and BP and to return to independence with gait to go home with wife.  Follow acutely for goals of PT on POC.   Recommendations for follow up therapy are one component of a multi-disciplinary discharge planning process, led by the attending physician.  Recommendations may be updated based on patient status, additional functional criteria and insurance authorization.  Follow Up Recommendations  Skilled nursing-short term rehab (<3 hours/day) Can patient physically be transported by private vehicle: No   Assistance Recommended at Discharge Frequent or constant Supervision/Assistance  Patient can return home with the following A lot of help with walking and/or transfers;A lot of help with bathing/dressing/bathroom;Assistance with cooking/housework;Direct supervision/assist for medications management;Direct supervision/assist for financial management;Assist for transportation;Help with stairs or ramp for entrance   Equipment Recommendations  None recommended by PT    Recommendations for Other Services       Precautions / Restrictions Precautions Precautions: Fall Precaution Comments: watch  BP Restrictions Weight Bearing Restrictions: No     Mobility  Bed Mobility Overal bed mobility: Needs Assistance Bed Mobility: Supine to Sit     Supine to sit: Min assist     General bed mobility comments: Pt is given a minor support on trunk after initiating legs on bed to side of bed    Transfers Overall transfer level: Needs assistance Equipment used: 1 person hand held assist Transfers: Sit to/from Stand, Bed to chair/wheelchair/BSC Sit to Stand: Min assist   Step pivot transfers: Min assist       General transfer comment: light headed sitting so did a quicker step pivot to chair as diastolic is 53 sitting    Ambulation/Gait               General Gait Details: deferred due to light headed feelings   Stairs             Wheelchair Mobility    Modified Rankin (Stroke Patients Only)       Balance Overall balance assessment: Needs assistance Sitting-balance support: Feet supported Sitting balance-Leahy Scale: Fair   Postural control: Posterior lean Standing balance support: Bilateral upper extremity supported Standing balance-Leahy Scale: Poor                              Cognition Arousal/Alertness: Awake/alert Behavior During Therapy: Flat affect Overall Cognitive Status: Impaired/Different from baseline Area of Impairment: Problem solving, Awareness, Following commands, Attention                   Current Attention Level: Selective Memory: Decreased short-term memory Following Commands: Follows one step  commands with increased time   Awareness: Intellectual Problem Solving: Slow processing, Requires verbal cues, Requires tactile cues          Exercises      General Comments General comments (skin integrity, edema, etc.): pt was a bit hypotensive sitting as compared  to bed supine, but could tolerate a quick sidestep to sit in chair with legs then elevated.      Pertinent Vitals/Pain Pain Assessment Pain  Assessment: Faces Faces Pain Scale: Hurts a little bit Pain Location: generally to move Pain Descriptors / Indicators: Guarding Pain Intervention(s): Limited activity within patient's tolerance, Monitored during session, Repositioned    Home Living                          Prior Function            PT Goals (current goals can now be found in the care plan section) Acute Rehab PT Goals Patient Stated Goal: to feel better and go home Progress towards PT goals: Not progressing toward goals - comment    Frequency    Min 3X/week      PT Plan Current plan remains appropriate    Co-evaluation              AM-PAC PT "6 Clicks" Mobility   Outcome Measure  Help needed turning from your back to your side while in a flat bed without using bedrails?: A Little Help needed moving from lying on your back to sitting on the side of a flat bed without using bedrails?: A Little Help needed moving to and from a bed to a chair (including a wheelchair)?: A Little Help needed standing up from a chair using your arms (e.g., wheelchair or bedside chair)?: A Little Help needed to walk in hospital room?: A Lot Help needed climbing 3-5 steps with a railing? : Total 6 Click Score: 15    End of Session Equipment Utilized During Treatment: Gait belt Activity Tolerance: Patient limited by fatigue;Treatment limited secondary to medical complications (Comment) Patient left: in chair;with call bell/phone within reach;with chair alarm set;with family/visitor present Nurse Communication: Mobility status PT Visit Diagnosis: Muscle weakness (generalized) (M62.81);Difficulty in walking, not elsewhere classified (R26.2);Unsteadiness on feet (R26.81)     Time: 3570-1779 PT Time Calculation (min) (ACUTE ONLY): 24 min  Charges:  $Therapeutic Activity: 23-37 mins       Ramond Dial 10/20/2022, 12:52 PM  Mee Hives, PT PhD Acute Rehab Dept. Number: [place] [facility] [place]

## 2022-10-20 NOTE — Plan of Care (Signed)
Pt had BM x1. Pt having urinary retention and discomfort in groin area; bladder scan performed; in-and-out cath done to obtain urine sample. Pt not eating meals; pt drinking glucernas.  Problem: Clinical Measurements: Goal: Ability to maintain clinical measurements within normal limits will improve Outcome: Progressing Goal: Will remain free from infection Outcome: Progressing Goal: Diagnostic test results will improve Outcome: Progressing Goal: Respiratory complications will improve Outcome: Progressing Goal: Cardiovascular complication will be avoided Outcome: Progressing   Problem: Elimination: Goal: Will not experience complications related to bowel motility Outcome: Progressing   Problem: Health Behavior/Discharge Planning: Goal: Ability to manage health-related needs will improve Outcome: Not Progressing   Problem: Activity: Goal: Risk for activity intolerance will decrease Outcome: Not Progressing   Problem: Nutrition: Goal: Adequate nutrition will be maintained Outcome: Not Progressing   Problem: Elimination: Goal: Will not experience complications related to urinary retention Outcome: Not Progressing

## 2022-10-20 NOTE — TOC Progression Note (Addendum)
Transition of Care [facility]) - Progression Note    Patient Details  Name: [name] MRN: [id number] Date of Birth: [birth year]  Transition of Care [facility]) CM/SW [place], LCSW Phone Number: 10/20/2022, 12:22 PM  Clinical Narrative:   CSW reached to [facility] SNF. [facility] informed CSW that they have received referral and will respond in the [place].   13:32 [facility] cannot offer SNF bed. TOC will continue to follow to assist with DC once disposition is determined.    Expected Discharge Plan: [place] Barriers to Discharge: Continued Medical Work up  Expected Discharge Plan and Services       Living arrangements for the past 2 months: Single Family Home                                       Social Determinants of Health (SDOH) Interventions SDOH Screenings   Food Insecurity: No Food Insecurity (08/26/2022)  Housing: [place]  (03/02/2021)  Transportation Needs: No Transportation Needs (08/26/2022)  Alcohol Screen: Low Risk  (03/02/2021)  Depression (PHQ2-9): High Risk (10/01/2022)  Financial Resource Strain: Low Risk  (03/29/2022)  Physical Activity: Inactive (03/29/2022)  Social Connections: Socially Integrated (03/02/2021)  Stress: No Stress Concern Present (03/29/2022)  Tobacco Use: Medium Risk (10/13/2022)    Readmission Risk Interventions    02/08/2022   10:44 AM  Readmission Risk Prevention Plan  Transportation Screening Complete  PCP or Specialist Appt within 3-5 Days Complete  HRI or Center Line Complete  Social Work Consult for [place] Planning/Counseling [place] Not Applicable  Medication Review Press photographer) Complete   Beckey Rutter, MSW, LCSWA, LCASA Transitions of Care  Clinical Social Worker I

## 2022-10-20 NOTE — Progress Notes (Addendum)
PROGRESS NOTE    [name]  ZHY:[id number] DOB: [birth year] DOA: 10/13/2022 PCP: [name], MD   [name] is an 89 y.o. M with pAF on Eliquis, CAD s/p CABG and multiple PCI last >25yr DM, HTN, HLD, sdCHF EF now 25-30%, CKD IIIb baseline 1.7-2.0, COVID earlier this month, and prosCA who presented with weakness and fatigue.  -In the ER, influenza A positive. -Admitted on Tamiflu, CHF with worsening cardiomyopathy, echo noted EF of 25-30%, severely reduced RV function, symptomatic bradycardia,  cards consulted, Coreg discontinued -Treated with diuretics, complicated by hyponatremia, ongoing failure to thrive from well prior to this admission -1/2:  for PICC line placed -1/3, sodium improving, creatinine stable, palliative consulted  Subjective: -Reports difficulty urinating, oral intake remains very poor, feels weak and tired  Assessment and Plan:  Influenza A -Completed 5 days Tamiflu.   - Oral intake low, mobility poor.  Complicated by ongoing failure to thrive.    -PT following, encouraged increased intake  Failure to thrive in adult -Per wife had ongoing decline few months ago then contracted COVID, declined further, now with influenza A, worsening cardiomyopathy  -Appears euvolemic, oral intake is very poor, diuretics on hold now  -Sodium improving  -Palliative consulted for goals of care  Acute on chronic systolic CHF Severe BiV failure -Known cardiomyopathy, echo this admission with EF down further to 25-30%, severe RV failure -Diuresed with IV Lasix initially, now appears euvolemic, Lasix on hold, oral intake is very poor -Sodium improving  Hyponatremia -Multifactorial in the setting of CHF, diuretics, SSRI etc. -Improving, can hold off on tolvaptan -A.m. cortisol is normal, SSRI on hold  ?  Urinary retention -Check bladder scan today  NSVT (nonsustained ventricular tachycardia) (HCC) Brief run of 12 beats VT yesterday, asymptomatic. -Mag normal, K>  4  Pressure injury of skin Not present on admission  Thrombocytopenia ([place] Platelets down to 75,000, due to influenza.  Now normalized.  Symptomatic bradycardia Cardiology were consulted, BB stopped.  No further work up recommended. - Stop Coreg  Anemia Hemoglobin stable   Type 2 diabetes mellitus with hyperlipidemia (HCC) Glucose controlled - Continue glargine - Continue sliding scale corrections - Hold Januvia  Stage 3b chronic kidney disease (CKD) (HCC) Baseline 1.7-2, creatinine stable relative to baseline.  Acute kidney injury ruled out  Persistent atrial fibrillation (HCC) Rate controlled - Continue Eliquis - Hold carvedilol  Coronary artery disease of native artery of native heart with stable angina pectoris (HCC) - Continue Eliquis, Crestor - Hold carvedilol, spironolactone  Hypertension BP normal - Continue amlodipine - Hold spironolactone, Coreg, torsemide  DVT prophylaxis: Apixaban Code Status: DNR Family Communication: Wife and son at bedside Disposition Plan: To be determined  Consultants: Cardiology, palliative care care   Procedures:   Antimicrobials:    Objective: Vitals:   10/19/22 1546 10/19/22 1934 10/20/22 0413 10/20/22 1052  BP:  117/77 (!) 117/48 138/61  Pulse: 82 65 (!) 52 (!) 54  Resp: '17 17 19 20  '$ Temp:  (!) 97.5 F (36.4 C) (!) 97.5 F (36.4 C) (!) 97.3 F (36.3 C)  TempSrc:  Oral Oral Oral  SpO2: 97% 96% 97% 97%  Weight:   62.8 kg   Height:        Intake/Output Summary (Last 24 hours) at 10/20/2022 1123 Last data filed at 10/20/2022 0418 Gross per 24 hour  Intake 490 ml  Output 700 ml  Net -210 ml   Filed Weights   10/18/22 0100 10/19/22 0515 10/20/22 0413  Weight: 62.1 kg 62.2 kg 62.8 kg    Examination:  General exam: Chronically ill elderly male laying in bed, awake alert, oriented to self and place, flat affect HEENT: No JVD CVS: S1-S2, regular rhythm Lungs: Decreased breath sounds at the bases Abdomen:  Soft, nontender, bowel sounds present Extremities: No edema Psych: Flat affect   Data Reviewed:   CBC: Recent Labs  Lab 10/15/22 0205 10/16/22 0043 10/17/22 0041 10/19/22 0112 10/20/22 0605  WBC 9.7 10.9* 11.1* 11.8* 11.9*  HGB 13.0 12.6* 12.2* 12.4* 11.6*  HCT 39.2 38.2* 36.9* 37.7* 35.3*  MCV 81.5 80.1 80.7 82.0 81.9  PLT 82* 102* 126* 155 017   Basic Metabolic Panel: Recent Labs  Lab 10/17/22 0041 10/18/22 0050 10/19/22 0112 10/19/22 0856 10/19/22 1422 10/20/22 0605  NA 125* 124* 122* 124*  --  127*  K 4.7 4.7 5.1 5.3*  --  4.8  CL 96* 94* 95* 94*  --  95*  CO2 20* 19* 19* 17*  --  23  GLUCOSE 149* 123* 136* 139*  --  84  BUN 38* 35* 35* 36*  --  37*  CREATININE 1.78* 1.75* 1.82* 1.90*  --  1.73*  CALCIUM 9.3 9.7 9.9 10.1  --  10.5*  MG  --   --   --   --  2.0  --    GFR: Estimated Creatinine Clearance: 26.7 mL/min (A) (by C-G formula based on SCr of 1.73 mg/dL (H)). Liver Function Tests: Recent Labs  Lab 10/19/22 0856  AST 22  ALT 30  ALKPHOS 132*  BILITOT 1.0  PROT 6.9  ALBUMIN 2.4*   No results for input(s): "LIPASE", "AMYLASE" in the last 168 hours. No results for input(s): "AMMONIA" in the last 168 hours. Coagulation Profile: No results for input(s): "INR", "PROTIME" in the last 168 hours. Cardiac Enzymes: No results for input(s): "CKTOTAL", "CKMB", "CKMBINDEX", "TROPONINI" in the last 168 hours. BNP (last 3 results) Recent Labs    10/21/21 0939 11/11/21 1020 02/01/22 1003  PROBNP 1,109.0* 1,541.0* 1,846.0*   HbA1C: No results for input(s): "HGBA1C" in the last 72 hours. CBG: Recent Labs  Lab 10/19/22 0641 10/19/22 1154 10/19/22 1659 10/19/22 2122 10/20/22 0615  GLUCAP 122* 188* 302* 186* 87   Lipid Profile: Recent Labs    10/19/22 0856  CHOL 66  HDL 28*  LDLCALC 26  TRIG 61  CHOLHDL 2.4   Thyroid Function Tests: No results for input(s): "TSH", "T4TOTAL", "FREET4", "T3FREE", "THYROIDAB" in the last 72 hours. Anemia  Panel: No results for input(s): "VITAMINB12", "FOLATE", "FERRITIN", "TIBC", "IRON", "RETICCTPCT" in the last 72 hours. Urine analysis:    Component Value Date/Time   COLORURINE YELLOW 10/14/2022 1026   APPEARANCEUR CLEAR 10/14/2022 1026   LABSPEC 1.008 10/14/2022 1026   PHURINE 6.0 10/14/2022 1026   GLUCOSEU NEGATIVE 10/14/2022 1026   HGBUR MODERATE (A) 10/14/2022 1026   HGBUR trace-intact 11/23/2007 0000   BILIRUBINUR NEGATIVE 10/14/2022 1026   KETONESUR NEGATIVE 10/14/2022 1026   PROTEINUR NEGATIVE 10/14/2022 1026   UROBILINOGEN 0.2 02/16/2011 2229   NITRITE NEGATIVE 10/14/2022 1026   LEUKOCYTESUR NEGATIVE 10/14/2022 1026   Sepsis Labs: '@LABRCNTIP'$ (procalcitonin:4,lacticidven:4)  ) Recent Results (from the past 240 hour(s))  Resp panel by RT-PCR (RSV, Flu A&B, Covid) Anterior Nasal Swab     Status: Abnormal   Collection Time: 10/13/22  4:39 AM   Specimen: Anterior Nasal Swab  Result Value Ref Range Status   SARS Coronavirus 2 by RT PCR POSITIVE (A) NEGATIVE Final  Comment: (NOTE) SARS-CoV-2 target nucleic acids are DETECTED.  The SARS-CoV-2 RNA is generally detectable in upper respiratory specimens during the acute phase of infection. Positive results are indicative of the presence of the identified virus, but do not rule out bacterial infection or co-infection with other pathogens not detected by the test. Clinical correlation with patient history and other diagnostic information is necessary to determine patient infection status. The expected result is Negative.  Fact Sheet for Patients: EntrepreneurPulse.com.au  Fact Sheet for Healthcare Providers: IncredibleEmployment.be  This test is not yet approved or cleared by the [place] FDA and  has been authorized for detection and/or diagnosis of SARS-CoV-2 by FDA under an Emergency Use Authorization (EUA).  This EUA will remain in effect (meaning this test can be used) for the  duration of  the COVID-19 declaration under Section 564(b)(1) of the A ct, 21 U.S.C. section 360bbb-3(b)(1), unless the authorization is terminated or revoked sooner.     Influenza A by PCR POSITIVE (A) NEGATIVE Final   Influenza B by PCR NEGATIVE NEGATIVE Final    Comment: (NOTE) The Xpert Xpress SARS-CoV-2/FLU/RSV plus assay is intended as an aid in the diagnosis of influenza from Nasopharyngeal swab specimens and should not be used as a sole basis for treatment. Nasal washings and aspirates are unacceptable for Xpert Xpress SARS-CoV-2/FLU/RSV testing.  Fact Sheet for Patients: EntrepreneurPulse.com.au  Fact Sheet for Healthcare Providers: IncredibleEmployment.be  This test is not yet approved or cleared by the [place] FDA and has been authorized for detection and/or diagnosis of SARS-CoV-2 by FDA under an Emergency Use Authorization (EUA). This EUA will remain in effect (meaning this test can be used) for the duration of the COVID-19 declaration under Section 564(b)(1) of the Act, 21 U.S.C. section 360bbb-3(b)(1), unless the authorization is terminated or revoked.     Resp Syncytial Virus by PCR NEGATIVE NEGATIVE Final    Comment: (NOTE) Fact Sheet for Patients: EntrepreneurPulse.com.au  Fact Sheet for Healthcare Providers: IncredibleEmployment.be  This test is not yet approved or cleared by the [place] FDA and has been authorized for detection and/or diagnosis of SARS-CoV-2 by FDA under an Emergency Use Authorization (EUA). This EUA will remain in effect (meaning this test can be used) for the duration of the COVID-19 declaration under Section 564(b)(1) of the Act, 21 U.S.C. section 360bbb-3(b)(1), unless the authorization is terminated or revoked.  Performed at [facility], [place] [address] [place], [place] [address]   Culture, blood (routine x 2)     Status: None    Collection Time: 10/13/22  6:30 AM   Specimen: BLOOD LEFT FOREARM  Result Value Ref Range Status   Specimen Description BLOOD LEFT FOREARM  Final   Special Requests   Final    BOTTLES DRAWN AEROBIC AND ANAEROBIC Blood Culture results may not be optimal due to an inadequate volume of blood received in culture bottles   Culture   Final    NO GROWTH 5 DAYS Performed at [facility], [place] [address], [place],  [address]    Report Status 10/18/2022 FINAL  Final  Culture, blood (routine x 2)     Status: None   Collection Time: 10/13/22  6:39 AM   Specimen: BLOOD RIGHT WRIST  Result Value Ref Range Status   Specimen Description BLOOD RIGHT WRIST  Final   Special Requests   Final    BOTTLES DRAWN AEROBIC AND ANAEROBIC Blood Culture adequate volume   Culture   Final  NO GROWTH 5 DAYS Performed at [facility], [place] [address] [place], [place] [address]    Report Status 10/18/2022 FINAL  Final  Urine Culture     Status: None   Collection Time: 10/14/22 10:26 AM   Specimen: Urine, Clean Catch  Result Value Ref Range Status   Specimen Description URINE, CLEAN CATCH  Final   Special Requests NONE  Final   Culture   Final    NO GROWTH Performed at [facility], [place] [address] [place],  [address]    Report Status 10/15/2022 FINAL  Final     Radiology Studies: DG CHEST PORT 1 VIEW  Result Date: 10/19/2022 CLINICAL DATA:  PIC catheter adjustment EXAM: PORTABLE CHEST 1 VIEW COMPARISON:  10/19/2022 at 3:16 p.m. as well as many others. FINDINGS: Interval retraction of the right-sided PICC catheter with the tip near the SVC right atrial junction. Status post median sternotomy. Enlarged cardiopericardial silhouette with aTAVR device. Stable patchy bilateral lung opacities, right greater than left. No pneumothorax or effusion. Overlapping cardiac leads. Calcified aorta IMPRESSION: Slight retraction of the PICC catheter with the tip near the SVC right atrial junction  Electronically Signed   By: Jill Side M.D.   On: 10/19/2022 18:25   DG CHEST PORT 1 VIEW  Result Date: 10/19/2022 CLINICAL DATA:  PICC in place EXAM: PORTABLE CHEST 1 VIEW COMPARISON:  Radiograph 10/13/2022 FINDINGS: Unchanged cardiomediastinal silhouette with prior TAVR, CABG, and coronary stent. Right upper extremity PICC tip overlies the right atrium. There is increased right mid to lower lung airspace disease and similar left lower lung airspace opacities. No pleural effusion or evidence of pneumothorax. Bones are unchanged. IMPRESSION: Increased right mid to lower lung airspace disease and similar left lower lung airspace opacities, consistent with multifocal pneumonia. Right upper extremity PICC tip overlies the right atrium. Electronically Signed   By: Maurine Simmering M.D.   On: 10/19/2022 15:27   [place] EKG SITE RITE  Result Date: 10/19/2022 If Site Rite image not attached, placement could not be confirmed due to current cardiac rhythm.    Scheduled Meds:  amLODipine  5 mg Oral Daily   apixaban  2.5 mg Oral BID   Chlorhexidine Gluconate Cloth  6 each Topical Daily   clopidogrel  75 mg Oral Daily   docusate sodium  100 mg Oral BID   feeding supplement (GLUCERNA SHAKE)  237 mL Oral TID BM   insulin aspart  0-15 Units Subcutaneous TID WC   insulin glargine-yfgn  10 Units Subcutaneous QPM   mometasone-formoterol  2 puff Inhalation BID   multivitamin  1 tablet Oral QHS   nicotine  14 mg Transdermal Daily   pantoprazole  40 mg Oral Daily   rosuvastatin  20 mg Oral Daily   sodium chloride flush  3 mL Intravenous Q12H   Continuous Infusions:   LOS: 6 days    Time spent: 12mn    PDomenic Polite MD [facility]   10/20/2022, 11:23 AM

## 2022-10-20 NOTE — Progress Notes (Signed)
Progress Note  Patient Name: [name] Date of Encounter: 10/20/2022  Primary Cardiologist:   [name], MD   Subjective   Denies SOB.  Does report burning when he tries to urinate.    Inpatient Medications    Scheduled Meds:  amLODipine  5 mg Oral Daily   apixaban  2.5 mg Oral BID   Chlorhexidine Gluconate Cloth  6 each Topical Daily   clopidogrel  75 mg Oral Daily   docusate sodium  100 mg Oral BID   feeding supplement (GLUCERNA SHAKE)  237 mL Oral TID BM   insulin aspart  0-15 Units Subcutaneous TID WC   insulin glargine-yfgn  10 Units Subcutaneous QPM   mometasone-formoterol  2 puff Inhalation BID   multivitamin  1 tablet Oral QHS   nicotine  14 mg Transdermal Daily   pantoprazole  40 mg Oral Daily   rosuvastatin  20 mg Oral Daily   sodium chloride flush  3 mL Intravenous Q12H   sodium chloride  1 g Oral BID WC   Continuous Infusions:  PRN Meds: acetaminophen **OR** acetaminophen, albuterol, bisacodyl, guaiFENesin, guaiFENesin-dextromethorphan, hydrALAZINE, melatonin, ondansetron **OR** ondansetron (ZOFRAN) IV, oxyCODONE, polyethylene glycol   Vital Signs    Vitals:   10/19/22 1545 10/19/22 1546 10/19/22 1934 10/20/22 0413  BP: 125/71  117/77 (!) 117/48  Pulse:  82 65 (!) 52  Resp:  '17 17 19  '$ Temp:   (!) 97.5 F (36.4 C) (!) 97.5 F (36.4 C)  TempSrc:   Oral Oral  SpO2:  97% 96% 97%  Weight:    62.8 kg  Height:        Intake/Output Summary (Last 24 hours) at 10/20/2022 0956 Last data filed at 10/20/2022 0418 Gross per 24 hour  Intake 610 ml  Output 700 ml  Net -90 ml   Filed Weights   10/18/22 0100 10/19/22 0515 10/20/22 0413  Weight: 62.1 kg 62.2 kg 62.8 kg    Telemetry    Atrial fib with slow ventricular rate - Personally Reviewed  ECG    NA - Personally Reviewed  Physical Exam   GEN: No  acute distress.    Chronically ill Neck: No  JVD Cardiac: Irregular RR, no murmurs, rubs, or gallops.  Respiratory: Clear  to auscultation  bilaterally. GI: Soft, nontender, non-distended, normal bowel sounds  MS:  No edema; No deformity. Neuro:   Nonfocal  Psych: Oriented and appropriate     Labs    Chemistry Recent Labs  Lab 10/19/22 0112 10/19/22 0856 10/20/22 0605  NA 122* 124* 127*  K 5.1 5.3* 4.8  CL 95* 94* 95*  CO2 19* 17* 23  GLUCOSE 136* 139* 84  BUN 35* 36* 37*  CREATININE 1.82* 1.90* 1.73*  CALCIUM 9.9 10.1 10.5*  PROT  --  6.9  --   ALBUMIN  --  2.4*  --   AST  --  22  --   ALT  --  30  --   ALKPHOS  --  132*  --   BILITOT  --  1.0  --   GFRNONAA 36* 34* 38*  ANIONGAP '8 13 9     '$ Hematology Recent Labs  Lab 10/17/22 0041 10/19/22 0112 10/20/22 0605  WBC 11.1* 11.8* 11.9*  RBC 4.57 4.60 4.31  HGB 12.2* 12.4* 11.6*  HCT 36.9* 37.7* 35.3*  MCV 80.7 82.0 81.9  MCH 26.7 27.0 26.9  MCHC 33.1 32.9 32.9  RDW 18.6* 18.8* 18.7*  PLT 126* 155  172    Cardiac EnzymesNo results for input(s): "TROPONINI" in the last 168 hours. No results for input(s): "TROPIPOC" in the last 168 hours.   BNP Recent Labs  Lab 10/13/22 1616  BNP 1,046.9*     DDimer No results for input(s): "DDIMER" in the last 168 hours.   Radiology    DG CHEST PORT 1 VIEW  Result Date: 10/19/2022 CLINICAL DATA:  PIC catheter adjustment EXAM: PORTABLE CHEST 1 VIEW COMPARISON:  10/19/2022 at 3:16 p.m. as well as many others. FINDINGS: Interval retraction of the right-sided PICC catheter with the tip near the SVC right atrial junction. Status post median sternotomy. Enlarged cardiopericardial silhouette with aTAVR device. Stable patchy bilateral lung opacities, right greater than left. No pneumothorax or effusion. Overlapping cardiac leads. Calcified aorta IMPRESSION: Slight retraction of the PICC catheter with the tip near the SVC right atrial junction Electronically Signed   By: Jill Side M.D.   On: 10/19/2022 18:25   DG CHEST PORT 1 VIEW  Result Date: 10/19/2022 CLINICAL DATA:  PICC in place EXAM: PORTABLE CHEST 1 VIEW  COMPARISON:  Radiograph 10/13/2022 FINDINGS: Unchanged cardiomediastinal silhouette with prior TAVR, CABG, and coronary stent. Right upper extremity PICC tip overlies the right atrium. There is increased right mid to lower lung airspace disease and similar left lower lung airspace opacities. No pleural effusion or evidence of pneumothorax. Bones are unchanged. IMPRESSION: Increased right mid to lower lung airspace disease and similar left lower lung airspace opacities, consistent with multifocal pneumonia. Right upper extremity PICC tip overlies the right atrium. Electronically Signed   By: Maurine Simmering M.D.   On: 10/19/2022 15:27   [place] EKG SITE RITE  Result Date: 10/19/2022 If Site Rite image not attached, placement could not be confirmed due to current cardiac rhythm.   Cardiac Studies   TTE 10/13/2022  1. Left ventricular ejection fraction, by estimation, is 25 to 30%. The  left ventricle has severely decreased function. The left ventricle  demonstrates global hypokinesis that appears worse in the septal segments.  There is moderate concentric left  ventricular hypertrophy. Left ventricular diastolic parameters are  indeterminate.   2. Right ventricular systolic function is severely reduced. The right  ventricular size is normal. PASP is 39.5mHg +RAP.   3. Left atrial size was severely dilated.   4. Right atrial size was severely dilated.   5. The mitral valve is degenerative. Mild mitral valve regurgitation.  Moderate to severe mitral annular calcification.   6. The aortic valve has been repaired/replaced. Aortic valve  regurgitation is trivial. There is a 29 mm [employer] valve present in the  aortic position. Procedure Date: 04/28/21. Mean gradient 1.435mg with Vmax  0.46m1min the setting of low output state.  LVOT VTI 12.4.   7. Aortic dilatation noted. There is mild dilatation of the ascending  aorta, measuring 40 mm.    Chest CT 10/14/2022 IMPRESSION: 1. Stable emphysematous  changes and areas of pulmonary scarring. 2. Significant areas of ground-glass opacity most notably in the right middle lobe and right upper lobe. This is typically seen with partial airspace filling process such as edema, inflammations/alveolitis. It could also be seen with constrictive bronchiolitis, hypersensitivity pneumonitis or cryptogenic organizing pneumonia. 3. Stable area of rounded atelectasis at the left lung base. 4. Stable advanced vascular disease. 5. No mediastinal or hilar mass or adenopathy. Stable scattered calcified and noncalcified nodes. 6. Stable surgical changes from TAVR and coronary artery bypass surgery.    Patient Profile  89 y.o. male with history of CAD status post CABG (multiple interventions to vein grafts and native CAD), left bundle branch block, persistent atrial fibrillation on Eliquis, systolic heart failure EF 35 to 40%, CKD, diabetes, hypertension, advanced age who was admitted on 10/13/2022 for weakness and fatigue. Cardiology was consulted for bradycardia.     Assessment & Plan    Acute systolic HF:   Intake and output is incomplete.    Minimal urine output.     However appears to be decreased intake as well.  I will check a UA.    Hyponatremia:    Na is much improved after sodium chloride tablets x 4.     PICC line was placed in anticipation of possible inotrope to improve cardiorenal syndrome and hyponatremia.  However, with improvement in Na I I will hold off on inotropes.   Will discontinue Na supplements.    Atrial fib:    Continue Eliquis.  Holding beta blocker with bradycardia.     CAD:   Continue Plavix in addition to Eliquis.  No bleeding issues.    HTN:    BP is labile with orthostasis. No med titration.    Status post TAVR:  Normal function this admission.   See echo above.   CKD IIIb:  Creat is stable and not far from baseline.   Disposition:  Discussed code status. Patient is now DNR.    For questions or updates, please  contact [place] Please consult [email or web address] for contact info under Cardiology/STEMI.   Signed, [name], MD  10/20/2022, 9:56 AM

## 2022-10-20 NOTE — Consult Note (Signed)
Consultation Note Date: 10/20/2022 at 1145  Patient Name: [name]  DOB: Jun 03, 1933  MRN: [id number]  Age / Sex: 89 y.o., male  PCP: [name], MD Referring Physician: Domenic Polite, MD  Reason for Consultation: Establishing goals of care  HPI/Patient Profile: 89 y.o. male  with past medical history of PAF (Eliquis, CAD status post CABG and multiple PCI's, type 2 diabetes (last A1c 6.9), HTN, HLD, ST CHF (EF 25 to 30%), CKD (3B) COVID-positive earlier this month (12/4), and prostate cancer admitted on 10/13/2022 with weakness and fatigue.  On presentation to ED, patient found to be positive for influenza A.  Patient being treated for CHF with [place] ending cardiomyopathy, severely reduced RV function, symptomatic bradycardia, hyponatremia, and FTT.  PMT was consulted to discuss goals of care.  Clinical Assessment and Goals of Care: I have reviewed medical records including EPIC notes, labs and imaging, assessed the patient and then met with patient, his wife, his sister and her husband, and his son at bedside to discuss diagnosis prognosis, GOC, EOL wishes, disposition and options.  I introduced Palliative Medicine as specialized medical care for people living with serious illness. It focuses on providing relief from the symptoms and stress of a serious illness. The goal is to improve quality of life for both the patient and the family.  As far as functional and nutritional status family shares patient had a poor appetite for approximately the last month.  Wife shares he would eat small bites but never full meals and oftentimes would push food away saying he was not hungry.  We discussed patient's current illness and what it means in the larger context of patient's on-going co-morbidities.  We discussed patient's functional, nutritional, and cognitive status as indicators of prognosis.   I attempted  to elicit values and goals of care important to the patient.  His son shares he would like for patient to get stronger if possible so that he can go to rehab and eventually come home.  Wife is in agreement for patient to get stronger.  Discussed frailty and improving functional status given patient's current weakness and poor p.o. intake.  Family indicates they want to give patient more time to see if he can get stronger.  Patient states that he will do what ever the doctors and medical team at the hospital tell him he needs to do to get better.  We discussed potential for patient to discharge to SNF for rehab.  Patient was in agreement with what ever advice medical team gives him.  Reviewed that patient has agency over recommendations provided by medical team.  Education offered regarding concept specific to human mortality and the limitations of medical interventions to prolong life when the body begins to fail to thrive.  Also discussed that patient is influenza positive and that masking and proper precautions should be taken for all visitors.  Pain assessment completed.  Patient endorses pain increases when laying flat and in one position for an extended amount of time.  He  denies chest pain, SOB.  He endorses mild discomfort in abdominal abdomen.  However, abdominal assessment benign.  Reviewed use of pain medicines for work of breathing as well as pain.  Discussed with patient/family the importance of continued conversation with family and the medical providers regarding overall plan of care and treatment options, ensuring decisions are within the context of the patient's values and GOCs.    Time for outcomes.   Questions and concerns were addressed. The family was encouraged to call with questions or concerns.  PMT will continue to follow.  Primary Decision Maker PATIENT  Physical Exam Vitals reviewed.  Constitutional:      General: He is not in acute distress.    Appearance: He is normal  weight. He is not ill-appearing.  HENT:     Head: Normocephalic.     Mouth/Throat:     Mouth: Mucous membranes are moist.  Eyes:     Pupils: Pupils are equal, round, and reactive to light.  Cardiovascular:     Rate and Rhythm: Normal rate.     Pulses: Normal pulses.  Abdominal:     General: Bowel sounds are normal.     Palpations: Abdomen is soft.  Musculoskeletal:     Comments: Generalized weakness  Skin:    General: Skin is warm and dry.  Neurological:     Mental Status: He is alert and oriented to person, place, and time.  Psychiatric:        Mood and Affect: Mood normal.        Behavior: Behavior normal.        Thought Content: Thought content normal.        Judgment: Judgment normal.     Palliative Assessment/Data: 40%     Thank you for this consult. Palliative medicine will continue to follow and assist holistically.   Time Total: 75 minutes Greater than 50%  of this time was spent counseling and coordinating care related to the above assessment and plan.  Signed by: Jordan Hawks, DNP, FNP-BC Palliative Medicine    Please contact Palliative Medicine Team phone at [phone] for questions and concerns.  For individual provider: See Shea Evans

## 2022-10-21 ENCOUNTER — Inpatient Hospital Stay (HOSPITAL_COMMUNITY): Payer: Medicare PPO

## 2022-10-21 DIAGNOSIS — Z66 Do not resuscitate: Secondary | ICD-10-CM

## 2022-10-21 DIAGNOSIS — R531 Weakness: Secondary | ICD-10-CM | POA: Diagnosis not present

## 2022-10-21 DIAGNOSIS — I5022 Chronic systolic (congestive) heart failure: Secondary | ICD-10-CM | POA: Diagnosis not present

## 2022-10-21 DIAGNOSIS — J101 Influenza due to other identified influenza virus with other respiratory manifestations: Secondary | ICD-10-CM | POA: Diagnosis not present

## 2022-10-21 DIAGNOSIS — J189 Pneumonia, unspecified organism: Secondary | ICD-10-CM | POA: Diagnosis not present

## 2022-10-21 LAB — CBC
HCT: 33.6 % — ABNORMAL LOW (ref 39.0–52.0)
Hemoglobin: 11.3 g/dL — ABNORMAL LOW (ref 13.0–17.0)
MCH: 27.1 pg (ref 26.0–34.0)
MCHC: 33.6 g/dL (ref 30.0–36.0)
MCV: 80.6 fL (ref 80.0–100.0)
Platelets: 194 10*3/uL (ref 150–400)
RBC: 4.17 MIL/uL — ABNORMAL LOW (ref 4.22–5.81)
RDW: 18.4 % — ABNORMAL HIGH (ref 11.5–15.5)
WBC: 9.7 10*3/uL (ref 4.0–10.5)
nRBC: 0 % (ref 0.0–0.2)

## 2022-10-21 LAB — BASIC METABOLIC PANEL
Anion gap: 9 (ref 5–15)
BUN: 37 mg/dL — ABNORMAL HIGH (ref 8–23)
CO2: 21 mmol/L — ABNORMAL LOW (ref 22–32)
Calcium: 10 mg/dL (ref 8.9–10.3)
Chloride: 95 mmol/L — ABNORMAL LOW (ref 98–111)
Creatinine, Ser: 1.63 mg/dL — ABNORMAL HIGH (ref 0.61–1.24)
GFR, Estimated: 41 mL/min — ABNORMAL LOW (ref >60)
Glucose, Bld: 97 mg/dL (ref 70–99)
Potassium: 4.8 mmol/L (ref 3.5–5.1)
Sodium: 125 mmol/L — ABNORMAL LOW (ref 135–145)

## 2022-10-21 LAB — GLUCOSE, CAPILLARY
Glucose-Capillary: 94 mg/dL (ref 70–99)
Glucose-Capillary: 168 mg/dL — ABNORMAL HIGH (ref 70–99)
Glucose-Capillary: 147 mg/dL — ABNORMAL HIGH (ref 70–99)

## 2022-10-21 MED ORDER — ADULT MULTIVITAMIN W/MINERALS CH
1.0000 | ORAL_TABLET | Freq: Every day | ORAL | Status: DC
  Administered 2022-10-21 – 2022-10-25 (×5): 1 via ORAL
  Filled 2022-10-21 (×5): qty 1

## 2022-10-21 MED ORDER — SODIUM CHLORIDE 1 G PO TABS: 1.0000 g | ORAL_TABLET | Freq: Two times a day (BID) | ORAL | Status: DC

## 2022-10-21 MED ORDER — PROSOURCE PLUS PO LIQD
30.0000 mL | Freq: Two times a day (BID) | ORAL | Status: DC
  Administered 2022-10-22 – 2022-10-25 (×5): 30 mL via ORAL
  Filled 2022-10-21 (×5): qty 30

## 2022-10-21 MED ORDER — ORAL CARE MOUTH RINSE: 15.0000 mL | OROMUCOSAL | Status: DC | PRN

## 2022-10-21 MED ORDER — SODIUM CHLORIDE 1 G PO TABS
1.0000 g | ORAL_TABLET | Freq: Two times a day (BID) | ORAL | Status: AC
  Administered 2022-10-21 (×2): 1 g via ORAL
  Filled 2022-10-21 (×2): qty 1

## 2022-10-21 NOTE — TOC Progression Note (Signed)
Transition of Care [facility]) - Progression Note    Patient Details  Name: ALEXXANDER KURT MRN: [id number] Date of Birth: 1933-04-01  Transition of Care [facility]) CM/SW [place], LCSW Phone Number: 10/21/2022, 4:12 PM  Clinical Narrative:   CSW met with pt at bedside alongside daughter. CSW notified family that they didn't receive an offer from [place]. Daughter agreed that they will accept the SNF offer from [facility]. CSW reached out and confirmed with [facility] bed offer is still valid and they can take pt when dc in the next couple of days.     Expected Discharge Plan: [place] Barriers to Discharge: Continued Medical Work up  Expected Discharge Plan and Services       Living arrangements for the past 2 months: Single Family Home                                       Social Determinants of Health (SDOH) Interventions SDOH Screenings   Food Insecurity: No Food Insecurity (08/26/2022)  Housing: [place]  (03/02/2021)  Transportation Needs: No Transportation Needs (08/26/2022)  Alcohol Screen: Low Risk  (03/02/2021)  Depression (PHQ2-9): High Risk (10/01/2022)  Financial Resource Strain: Low Risk  (03/29/2022)  Physical Activity: Inactive (03/29/2022)  Social Connections: Socially Integrated (03/02/2021)  Stress: No Stress Concern Present (03/29/2022)  Tobacco Use: Medium Risk (10/13/2022)    Readmission Risk Interventions    02/08/2022   10:44 AM  Readmission Risk Prevention Plan  Transportation Screening Complete  PCP or Specialist Appt within 3-5 Days Complete  HRI or [place] Complete  Social Work Consult for [place] Planning/Counseling [place] Not Applicable  Medication Review Press photographer) Complete   Beckey Rutter, MSW, LCSWA, LCASA Transitions of Care  Clinical Social Worker I

## 2022-10-21 NOTE — Progress Notes (Signed)
Nutrition Follow-up  DOCUMENTATION CODES:   Not applicable  INTERVENTION:  Recommend diet liberalization from Soft to Regular versus consulting ST for swallow assessment Discontinue Glucerna Shakes Ensure Enlive po BID, each supplement provides 350 kcal and 20 grams of protein. (Strawberry) 30 ml ProSource Plus BID, each supplement provides 100 kcals and 15 grams protein.  MVI with minerals daily  NUTRITION DIAGNOSIS:   Increased nutrient needs related to acute illness as evidenced by estimated needs.  ongoing  GOAL:   Patient will meet greater than or equal to 90% of their needs  Goal unmet  MONITOR:   PO intake, Supplement acceptance  REASON FOR ASSESSMENT:   Consult Assessment of nutrition requirement/status  ASSESSMENT:   89 y.o. male admits related to weakness. PMH includes: CHF, DM, HTN, HLD, afib, and pancreatic cancer. Pt is currently receiving medical management for Flu A.  Pt's daughter, Sharee Pimple, at bedside. She reports that he had COVID in November which affected his appetite. He then regained his appetite and was eating really well up until catching the flu. She states that he is able to drink beverages greater than he eats. Sharee Pimple mentions that he previously had thrush which has been treated and resolved. He continues to only take bites of food but is concerned as she noticed that he is having to chew more than normal and requires cues to continue chewing and swallowing. Discussed diet liberalization d/t poor PO intake however suspect that he may also benefit from a ST evaluation to ensure pt is on most appropriate diet. Reached out to MD with recommendations.   Meal completions: 12/30: 50% lunch, 80% dinner 1/2: 10% breakfast 1/3: 0% breakfast, 0% lunch, 50% dinner 1/4: 15% lunch  Reviewed weights. Pt's weight remains fairly stable at 62 kg since 12/31.  Medications: colace (not given today), SSI 0-15 units TID, semglee 10 units daily, rena-vit  Labs: sodium  125, BUN 37, Cr 1.63, GFR 41, CBG's 94-168 x24 hours - Cardiology restarting salt tablets  Diet Order:   Diet Order             DIET SOFT Room service appropriate? Yes; Fluid consistency: Thin; Fluid restriction: 2000 mL Fluid  Diet effective now                   EDUCATION NEEDS:   Not appropriate for education at this time  Skin:  Skin Assessment: Skin Integrity Issues: Skin Integrity Issues:: Stage II, Other (Comment) Stage II: R buttock Other: PI: L buttock  Last BM:  1/3  Height:   Ht Readings from Last 1 Encounters:  10/13/22 '5\' 9"'$  (1.753 m)    Weight:   Wt Readings from Last 1 Encounters:  10/20/22 62.8 kg   BMI:  Body mass index is 20.45 kg/m.  Estimated Nutritional Needs:   Kcal:  1590-1910 kcals  Protein:  80-95 gm  Fluid:  >/= 1.5 L  Clayborne Dana, RDN, LDN Clinical Nutrition

## 2022-10-21 NOTE — Progress Notes (Signed)
PROGRESS NOTE    [name]  TXM:[id number] DOB: 11-16-1932 DOA: 10/13/2022 PCP: [name], MD   [name] is an 89 y.o. M with pAF on Eliquis, CAD s/p CABG and multiple PCI last >28yr DM, HTN, HLD, sdCHF EF now 25-30%, CKD IIIb baseline 1.7-2.0, COVID earlier this month, and prosCA who presented with weakness and fatigue.  -In the ER, influenza A positive. -Admitted on Tamiflu, CHF with worsening cardiomyopathy, echo noted EF of 25-30%, severely reduced RV function, symptomatic bradycardia,  cards consulted, Coreg discontinued -Treated with diuretics, complicated by hyponatremia, ongoing failure to thrive from well prior to this admission -1/2:  for PICC line placed -1/3, sodium improving, creatinine stable, palliative consulted -1/4: Sodium down marginally, cards restarting salt tabs  Subjective: -Tired, oral intake is poor, denies any dyspnea  Assessment and Plan:  Influenza A -Completed 5 days Tamiflu.   - Oral intake low, mobility poor.  Complicated by ongoing failure to thrive.    -PT following, encouraged increased intake  Failure to thrive in adult -Per wife had ongoing decline few months ago then contracted COVID, declined further, now with influenza A, worsening cardiomyopathy  -Appears euvolemic, oral intake is very poor, diuretics on hold now  -Sodium continues to fluctuate -Palliative consulted for goals of care, meeting today  Acute on chronic systolic CHF Severe BiV failure -Known cardiomyopathy, echo this admission with EF down further to 25-30%, severe RV failure -Diuresed with IV Lasix initially, now appears euvolemic, Lasix on hold, oral intake is very poor -Cards following -Sodium down marginally, restarting salt tabs  Hyponatremia -Multifactorial in the setting of CHF, diuretics, SSRI etc. -A.m. cortisol is normal, SSRI on hold -Sodium down slightly today, cards has ordered salt tabs  ?  Urinary retention -Required I/O cath yesterday, check  bladder scan intermittently  NSVT (nonsustained ventricular tachycardia) (HCC) Brief run of 12 beats VT yesterday, asymptomatic. -Mag normal, K> 4  Pressure injury of skin Not present on admission  Thrombocytopenia ([place] Platelets down to 75,000, due to influenza.  Now normalized.  Symptomatic bradycardia Cardiology were consulted, BB stopped.  No further work up recommended. - Stop Coreg  Anemia Hemoglobin stable   Type 2 diabetes mellitus with hyperlipidemia (HCC) Glucose controlled - Continue glargine - Continue sliding scale corrections - Hold Januvia  Stage 3b chronic kidney disease (CKD) (HCC) Baseline 1.7-2, creatinine stable relative to baseline.  Acute kidney injury ruled out  Persistent atrial fibrillation (HCC) Rate controlled - Continue Eliquis - Hold carvedilol  Coronary artery disease of native artery of native heart with stable angina pectoris (HCC) - Continue Eliquis, Crestor - Hold carvedilol, spironolactone  Hypertension BP normal - Continue amlodipine - Hold spironolactone, Coreg, torsemide  DVT prophylaxis: Apixaban Code Status: DNR Family Communication: Wife and daughter at bedside Disposition Plan: Home with hospice versus SNF with palliative care  Consultants: Cardiology, palliative care care   Procedures:   Antimicrobials:    Objective: Vitals:   10/20/22 1921 10/20/22 2055 10/21/22 0853 10/21/22 1116  BP: (!) 127/47   118/75  Pulse: 75   65  Resp: 15   18  Temp: (!) 97.4 F (36.3 C)   98.8 F (37.1 C)  TempSrc: Oral   Axillary  SpO2: 95% 96% 95% 97%  Weight:      Height:        Intake/Output Summary (Last 24 hours) at 10/21/2022 1147 Last data filed at 10/21/2022 1117 Gross per 24 hour  Intake 1440 ml  Output 1425 ml  Net 15 ml   Filed Weights   10/18/22 0100 10/19/22 0515 10/20/22 0413  Weight: 62.1 kg 62.2 kg 62.8 kg    Examination:  General exam: Chronically ill male laying in bed, AAO x 2, flat affect HEENT:  Positive JVD CVS: S1-S2, regular rhythm Lungs: Decreased breath sounds at bases Abdomen: Soft, nontender bowel sounds present Extremities: Trace edema Psych: Flat affect   Data Reviewed:   CBC: Recent Labs  Lab 10/16/22 0043 10/17/22 0041 10/19/22 0112 10/20/22 0605 10/21/22 0617  WBC 10.9* 11.1* 11.8* 11.9* 9.7  HGB 12.6* 12.2* 12.4* 11.6* 11.3*  HCT 38.2* 36.9* 37.7* 35.3* 33.6*  MCV 80.1 80.7 82.0 81.9 80.6  PLT 102* 126* 155 172 562   Basic Metabolic Panel: Recent Labs  Lab 10/18/22 0050 10/19/22 0112 10/19/22 0856 10/19/22 1422 10/20/22 0605 10/21/22 0617  NA 124* 122* 124*  --  127* 125*  K 4.7 5.1 5.3*  --  4.8 4.8  CL 94* 95* 94*  --  95* 95*  CO2 19* 19* 17*  --  23 21*  GLUCOSE 123* 136* 139*  --  84 97  BUN 35* 35* 36*  --  37* 37*  CREATININE 1.75* 1.82* 1.90*  --  1.73* 1.63*  CALCIUM 9.7 9.9 10.1  --  10.5* 10.0  MG  --   --   --  2.0  --   --    GFR: Estimated Creatinine Clearance: 28.4 mL/min (A) (by C-G formula based on SCr of 1.63 mg/dL (H)). Liver Function Tests: Recent Labs  Lab 10/19/22 0856  AST 22  ALT 30  ALKPHOS 132*  BILITOT 1.0  PROT 6.9  ALBUMIN 2.4*   No results for input(s): "LIPASE", "AMYLASE" in the last 168 hours. No results for input(s): "AMMONIA" in the last 168 hours. Coagulation Profile: No results for input(s): "INR", "PROTIME" in the last 168 hours. Cardiac Enzymes: No results for input(s): "CKTOTAL", "CKMB", "CKMBINDEX", "TROPONINI" in the last 168 hours. BNP (last 3 results) Recent Labs    11/11/21 1020 02/01/22 1003  PROBNP 1,541.0* 1,846.0*   HbA1C: No results for input(s): "HGBA1C" in the last 72 hours. CBG: Recent Labs  Lab 10/20/22 1129 10/20/22 1620 10/20/22 2113 10/21/22 0609 10/21/22 1113  GLUCAP 116* 121* 139* 94 168*   Lipid Profile: Recent Labs    10/19/22 0856  CHOL 66  HDL 28*  LDLCALC 26  TRIG 61  CHOLHDL 2.4   Thyroid Function Tests: No results for input(s): "TSH",  "T4TOTAL", "FREET4", "T3FREE", "THYROIDAB" in the last 72 hours. Anemia Panel: No results for input(s): "VITAMINB12", "FOLATE", "FERRITIN", "TIBC", "IRON", "RETICCTPCT" in the last 72 hours. Urine analysis:    Component Value Date/Time   COLORURINE YELLOW 10/20/2022 1112   APPEARANCEUR HAZY (A) 10/20/2022 1112   LABSPEC 1.015 10/20/2022 1112   PHURINE 5.0 10/20/2022 1112   GLUCOSEU NEGATIVE 10/20/2022 1112   HGBUR NEGATIVE 10/20/2022 1112   HGBUR trace-intact 11/23/2007 0000   BILIRUBINUR NEGATIVE 10/20/2022 1112   KETONESUR NEGATIVE 10/20/2022 1112   PROTEINUR 100 (A) 10/20/2022 1112   UROBILINOGEN 0.2 02/16/2011 2229   NITRITE NEGATIVE 10/20/2022 1112   LEUKOCYTESUR NEGATIVE 10/20/2022 1112   Sepsis Labs: '@LABRCNTIP'$ (procalcitonin:4,lacticidven:4)  ) Recent Results (from the past 240 hour(s))  Resp panel by RT-PCR (RSV, Flu A&B, Covid) Anterior Nasal Swab     Status: Abnormal   Collection Time: 10/13/22  4:39 AM   Specimen: Anterior Nasal Swab  Result Value Ref Range Status   SARS Coronavirus 2 by  RT PCR POSITIVE (A) NEGATIVE Final    Comment: (NOTE) SARS-CoV-2 target nucleic acids are DETECTED.  The SARS-CoV-2 RNA is generally detectable in upper respiratory specimens during the acute phase of infection. Positive results are indicative of the presence of the identified virus, but do not rule out bacterial infection or co-infection with other pathogens not detected by the test. Clinical correlation with patient history and other diagnostic information is necessary to determine patient infection status. The expected result is Negative.  Fact Sheet for Patients: EntrepreneurPulse.com.au  Fact Sheet for Healthcare Providers: IncredibleEmployment.be  This test is not yet approved or cleared by the [place] FDA and  has been authorized for detection and/or diagnosis of SARS-CoV-2 by FDA under an Emergency Use Authorization (EUA).   This EUA will remain in effect (meaning this test can be used) for the duration of  the COVID-19 declaration under Section 564(b)(1) of the A ct, 21 U.S.C. section 360bbb-3(b)(1), unless the authorization is terminated or revoked sooner.     Influenza A by PCR POSITIVE (A) NEGATIVE Final   Influenza B by PCR NEGATIVE NEGATIVE Final    Comment: (NOTE) The Xpert Xpress SARS-CoV-2/FLU/RSV plus assay is intended as an aid in the diagnosis of influenza from Nasopharyngeal swab specimens and should not be used as a sole basis for treatment. Nasal washings and aspirates are unacceptable for Xpert Xpress SARS-CoV-2/FLU/RSV testing.  Fact Sheet for Patients: EntrepreneurPulse.com.au  Fact Sheet for Healthcare Providers: IncredibleEmployment.be  This test is not yet approved or cleared by the [place] FDA and has been authorized for detection and/or diagnosis of SARS-CoV-2 by FDA under an Emergency Use Authorization (EUA). This EUA will remain in effect (meaning this test can be used) for the duration of the COVID-19 declaration under Section 564(b)(1) of the Act, 21 U.S.C. section 360bbb-3(b)(1), unless the authorization is terminated or revoked.     Resp Syncytial Virus by PCR NEGATIVE NEGATIVE Final    Comment: (NOTE) Fact Sheet for Patients: EntrepreneurPulse.com.au  Fact Sheet for Healthcare Providers: IncredibleEmployment.be  This test is not yet approved or cleared by the [place] FDA and has been authorized for detection and/or diagnosis of SARS-CoV-2 by FDA under an Emergency Use Authorization (EUA). This EUA will remain in effect (meaning this test can be used) for the duration of the COVID-19 declaration under Section 564(b)(1) of the Act, 21 U.S.C. section 360bbb-3(b)(1), unless the authorization is terminated or revoked.  Performed at [facility], [place] [address], [place],  [place] [address]   Culture, blood (routine x 2)     Status: None   Collection Time: 10/13/22  6:30 AM   Specimen: BLOOD LEFT FOREARM  Result Value Ref Range Status   Specimen Description BLOOD LEFT FOREARM  Final   Special Requests   Final    BOTTLES DRAWN AEROBIC AND ANAEROBIC Blood Culture results may not be optimal due to an inadequate volume of blood received in culture bottles   Culture   Final    NO GROWTH 5 DAYS Performed at [facility], [place] [address] [place], [place] [address]    Report Status 10/18/2022 FINAL  Final  Culture, blood (routine x 2)     Status: None   Collection Time: 10/13/22  6:39 AM   Specimen: BLOOD RIGHT WRIST  Result Value Ref Range Status   Specimen Description BLOOD RIGHT WRIST  Final   Special Requests   Final    BOTTLES DRAWN AEROBIC AND ANAEROBIC Blood Culture adequate  volume   Culture   Final    NO GROWTH 5 DAYS Performed at [facility], [place] [address], [place], [place] [address]    Report Status 10/18/2022 FINAL  Final  Urine Culture     Status: None   Collection Time: 10/14/22 10:26 AM   Specimen: Urine, Clean Catch  Result Value Ref Range Status   Specimen Description URINE, CLEAN CATCH  Final   Special Requests NONE  Final   Culture   Final    NO GROWTH Performed at [facility], [place] [address], [place], [place] [address]    Report Status 10/15/2022 FINAL  Final     Radiology Studies: DG CHEST PORT 1 VIEW  Result Date: 10/21/2022 CLINICAL DATA:  Cough and congestion.  Flu and COVID. EXAM: PORTABLE CHEST 1 VIEW COMPARISON:  10/19/2022 FINDINGS: Right-sided PICC line at superior caval/atrial junction. Patient rotated to the right with prior median sternotomy for CABG. Mild cardiomegaly. Atherosclerosis in the transverse aorta. No pleural effusion or pneumothorax. Mild interstitial edema. Similar appearance of right greater than left, lower and peripheral predominant airspace disease. IMPRESSION: No significant change since  10/19/2022. Multifocal peripheral and basilar predominant pulmonary opacities, favoring pneumonia. Cardiomegaly and mild interstitial edema. Aortic Atherosclerosis (ICD10-I70.0). Electronically Signed   By: Abigail Miyamoto M.D.   On: 10/21/2022 09:21   DG CHEST PORT 1 VIEW  Result Date: 10/19/2022 CLINICAL DATA:  PIC catheter adjustment EXAM: PORTABLE CHEST 1 VIEW COMPARISON:  10/19/2022 at 3:16 p.m. as well as many others. FINDINGS: Interval retraction of the right-sided PICC catheter with the tip near the SVC right atrial junction. Status post median sternotomy. Enlarged cardiopericardial silhouette with aTAVR device. Stable patchy bilateral lung opacities, right greater than left. No pneumothorax or effusion. Overlapping cardiac leads. Calcified aorta IMPRESSION: Slight retraction of the PICC catheter with the tip near the SVC right atrial junction Electronically Signed   By: Jill Side M.D.   On: 10/19/2022 18:25   DG CHEST PORT 1 VIEW  Result Date: 10/19/2022 CLINICAL DATA:  PICC in place EXAM: PORTABLE CHEST 1 VIEW COMPARISON:  Radiograph 10/13/2022 FINDINGS: Unchanged cardiomediastinal silhouette with prior TAVR, CABG, and coronary stent. Right upper extremity PICC tip overlies the right atrium. There is increased right mid to lower lung airspace disease and similar left lower lung airspace opacities. No pleural effusion or evidence of pneumothorax. Bones are unchanged. IMPRESSION: Increased right mid to lower lung airspace disease and similar left lower lung airspace opacities, consistent with multifocal pneumonia. Right upper extremity PICC tip overlies the right atrium. Electronically Signed   By: Maurine Simmering M.D.   On: 10/19/2022 15:27     Scheduled Meds:  amLODipine  5 mg Oral Daily   apixaban  2.5 mg Oral BID   Chlorhexidine Gluconate Cloth  6 each Topical Daily   clopidogrel  75 mg Oral Daily   docusate sodium  100 mg Oral BID   feeding supplement (GLUCERNA SHAKE)  237 mL Oral TID BM    insulin aspart  0-15 Units Subcutaneous TID WC   insulin glargine-yfgn  10 Units Subcutaneous QPM   mometasone-formoterol  2 puff Inhalation BID   multivitamin  1 tablet Oral QHS   pantoprazole  40 mg Oral Daily   rosuvastatin  20 mg Oral Daily   sodium chloride flush  3 mL Intravenous Q12H   sodium chloride  1 g Oral BID WC   Continuous Infusions:   LOS: 7 days    Time  spent: 53mn    PDomenic Polite MD [facility]   10/21/2022, 11:47 AM

## 2022-10-21 NOTE — Progress Notes (Signed)
Palliative Care Progress Note, Assessment & Plan   Patient Name: [name]       Date: 10/21/2022 DOB: Jan 16, 1933  Age: 89 y.o. MRN#: [id number] Attending Physician: Domenic Polite, MD Primary Care Physician: [name], MD Admit Date: 10/13/2022  Subjective: Patient is sitting up in bed in no apparent distress.  He acknowledges my presence and is able to make his wishes known.  His daughter is at bedside.  He has no acute complaints.  She is feeding him his lunch.  HPI: 89 y.o. male  with past medical history of PAF (Eliquis, CAD status post CABG and multiple PCI's, type 2 diabetes (last A1c 6.9), HTN, HLD, ST CHF (EF 25 to 30%), CKD (3B) COVID-positive earlier this month (12/4), and prostate cancer admitted on 10/13/2022 with weakness and fatigue.  On presentation to ED, patient found to be positive for influenza A.  Patient being treated for CHF with [place] ending cardiomyopathy, severely reduced RV function, symptomatic bradycardia, hyponatremia, and FTT.   PMT was consulted to discuss goals of care.  Summary of counseling/coordination of care: After reviewing the patient's chart and assessing the patient at bedside, I discussed plan of care and goals of treatment with patient and his daughter Sharee Pimple.  Discussed patient's current functional, nutritional, and cognitive status.    I attempted to elicit goals and values important to the patient and family.  Sharee Pimple shares that she wants to see patient to get stronger and be able to return home safely.  She says she has realistic expectations that he may never get back to the baseline that he was prior to having COVID in early December.  However, she is hopeful that he can gain some strength and continue to show improvement in p.o. intake.  Advance  care planning, anticipatory care needs, and CODE STATUS discussed.  Details of DNR outlined and Sharee Pimple asked appropriate questions.  DNR remains.  During our discussion, patient was able to eat several bites of various parts of his lunch.  He also requested fluids to which he drank an entire cup of milk an entire glass of water.  His p.o. intake and awareness seems to have increased in comparison to my visit with him yesterday.  He continues to have some confusion (asking about a pen while pointing to his bed sheets), but overall was more alert and interactive today than he was with me yesterday.  Family remains hopeful that patient will continue to get stronger and be able to participate in PT/OT.  Daughter asked questions regarding disposition.  I shared that TOC is following closely.    MOST form introduced and concepts discussed.  Copy left for Sharee Pimple and family to review.  After visit, I secure chatted with TOC SW and conveyed that family is interested in speaking with her about SNF for rehab options.  PMT will continue to follow.  Physical Exam Vitals reviewed.  Constitutional:      General: He is not in acute distress.    Appearance: He is normal weight. He is not ill-appearing.  HENT:     Head: Normocephalic.     Mouth/Throat:     Mouth: Mucous membranes are moist.  Eyes:  Pupils: Pupils are equal, round, and reactive to light.  Cardiovascular:     Rate and Rhythm: Normal rate. Rhythm irregular.     Pulses: Normal pulses.  Pulmonary:     Effort: Pulmonary effort is normal.  Abdominal:     Palpations: Abdomen is soft.  Musculoskeletal:     Comments: MAETC, generalized weakness  Neurological:     Mental Status: He is alert.     Comments: Oriented to self  Psychiatric:        Behavior: Behavior normal.             Palliative Assessment/Data: 40%    Total Time 50 minutes   Thank you for allowing the Palliative Medicine Team to assist in the care of this  patient.  [place] Ilsa Iha, FNP-BC Palliative Medicine Team Team Phone # [phone]

## 2022-10-21 NOTE — Progress Notes (Signed)
Progress Note  Patient Name: [name] Date of Encounter: 10/21/2022  Primary Cardiologist:   [name], MD   Subjective   Awake and confused.  No acute pain or SOB.   Inpatient Medications    Scheduled Meds:  amLODipine  5 mg Oral Daily   apixaban  2.5 mg Oral BID   Chlorhexidine Gluconate Cloth  6 each Topical Daily   clopidogrel  75 mg Oral Daily   docusate sodium  100 mg Oral BID   feeding supplement (GLUCERNA SHAKE)  237 mL Oral TID BM   insulin aspart  0-15 Units Subcutaneous TID WC   insulin glargine-yfgn  10 Units Subcutaneous QPM   mometasone-formoterol  2 puff Inhalation BID   multivitamin  1 tablet Oral QHS   pantoprazole  40 mg Oral Daily   rosuvastatin  20 mg Oral Daily   sodium chloride flush  3 mL Intravenous Q12H   Continuous Infusions:  PRN Meds: acetaminophen **OR** acetaminophen, albuterol, bisacodyl, guaiFENesin, guaiFENesin-dextromethorphan, hydrALAZINE, melatonin, ondansetron **OR** ondansetron (ZOFRAN) IV, mouth rinse, oxyCODONE, polyethylene glycol   Vital Signs    Vitals:   10/20/22 1517 10/20/22 1921 10/20/22 2055 10/21/22 0853  BP: (!) 126/48 (!) 127/47    Pulse: 83 75    Resp: (!) 22 15    Temp: (!) 97.3 F (36.3 C) (!) 97.4 F (36.3 C)    TempSrc: Oral Oral    SpO2: 97% 95% 96% 95%  Weight:      Height:        Intake/Output Summary (Last 24 hours) at 10/21/2022 0915 Last data filed at 10/20/2022 2300 Gross per 24 hour  Intake 1320 ml  Output 1175 ml  Net 145 ml   Filed Weights   10/18/22 0100 10/19/22 0515 10/20/22 0413  Weight: 62.1 kg 62.2 kg 62.8 kg    Telemetry    Atrial fib with slow ventricular rate - Personally Reviewed  ECG    NA - Personally Reviewed  Physical Exam   GEN: No  acute distress.   Frail appearing Neck: No  JVD Cardiac: Irregular RR, no murmurs, rubs, or gallops.  Respiratory: Decreased breath sounds bilaterally GI: Soft, nontender, non-distended, normal bowel sounds  MS:  no edema;  No deformity. Neuro:   Nonfocal  Psych:   Confused.  Oriented to person and place.     Labs    Chemistry Recent Labs  Lab 10/19/22 0856 10/20/22 0605 10/21/22 0617  NA 124* 127* 125*  K 5.3* 4.8 4.8  CL 94* 95* 95*  CO2 17* 23 21*  GLUCOSE 139* 84 97  BUN 36* 37* 37*  CREATININE 1.90* 1.73* 1.63*  CALCIUM 10.1 10.5* 10.0  PROT 6.9  --   --   ALBUMIN 2.4*  --   --   AST 22  --   --   ALT 30  --   --   ALKPHOS 132*  --   --   BILITOT 1.0  --   --   GFRNONAA 34* 38* 41*  ANIONGAP '13 9 9     '$ Hematology Recent Labs  Lab 10/19/22 0112 10/20/22 0605 10/21/22 0617  WBC 11.8* 11.9* 9.7  RBC 4.60 4.31 4.17*  HGB 12.4* 11.6* 11.3*  HCT 37.7* 35.3* 33.6*  MCV 82.0 81.9 80.6  MCH 27.0 26.9 27.1  MCHC 32.9 32.9 33.6  RDW 18.8* 18.7* 18.4*  PLT 155 172 194    Cardiac EnzymesNo results for input(s): "TROPONINI" in the last 168 hours.  No results for input(s): "TROPIPOC" in the last 168 hours.   BNP No results for input(s): "BNP", "PROBNP" in the last 168 hours.    DDimer No results for input(s): "DDIMER" in the last 168 hours.   Radiology    DG CHEST PORT 1 VIEW  Result Date: 10/19/2022 CLINICAL DATA:  PIC catheter adjustment EXAM: PORTABLE CHEST 1 VIEW COMPARISON:  10/19/2022 at 3:16 p.m. as well as many others. FINDINGS: Interval retraction of the right-sided PICC catheter with the tip near the SVC right atrial junction. Status post median sternotomy. Enlarged cardiopericardial silhouette with aTAVR device. Stable patchy bilateral lung opacities, right greater than left. No pneumothorax or effusion. Overlapping cardiac leads. Calcified aorta IMPRESSION: Slight retraction of the PICC catheter with the tip near the SVC right atrial junction Electronically Signed   By: Jill Side M.D.   On: 10/19/2022 18:25   DG CHEST PORT 1 VIEW  Result Date: 10/19/2022 CLINICAL DATA:  PICC in place EXAM: PORTABLE CHEST 1 VIEW COMPARISON:  Radiograph 10/13/2022 FINDINGS: Unchanged  cardiomediastinal silhouette with prior TAVR, CABG, and coronary stent. Right upper extremity PICC tip overlies the right atrium. There is increased right mid to lower lung airspace disease and similar left lower lung airspace opacities. No pleural effusion or evidence of pneumothorax. Bones are unchanged. IMPRESSION: Increased right mid to lower lung airspace disease and similar left lower lung airspace opacities, consistent with multifocal pneumonia. Right upper extremity PICC tip overlies the right atrium. Electronically Signed   By: Maurine Simmering M.D.   On: 10/19/2022 15:27   [place] EKG SITE RITE  Result Date: 10/19/2022 If Site Rite image not attached, placement could not be confirmed due to current cardiac rhythm.   Cardiac Studies   TTE 10/13/2022  1. Left ventricular ejection fraction, by estimation, is 25 to 30%. The  left ventricle has severely decreased function. The left ventricle  demonstrates global hypokinesis that appears worse in the septal segments.  There is moderate concentric left  ventricular hypertrophy. Left ventricular diastolic parameters are  indeterminate.   2. Right ventricular systolic function is severely reduced. The right  ventricular size is normal. PASP is 39.80mHg +RAP.   3. Left atrial size was severely dilated.   4. Right atrial size was severely dilated.   5. The mitral valve is degenerative. Mild mitral valve regurgitation.  Moderate to severe mitral annular calcification.   6. The aortic valve has been repaired/replaced. Aortic valve  regurgitation is trivial. There is a 29 mm Medtronic valve present in the  aortic position. Procedure Date: 04/28/21. Mean gradient 1.473mg with Vmax  0.24m74min the setting of low output state.  LVOT VTI 12.4.   7. Aortic dilatation noted. There is mild dilatation of the ascending  aorta, measuring 40 mm.    Chest CT 10/14/2022 IMPRESSION: 1. Stable emphysematous changes and areas of pulmonary scarring. 2. Significant  areas of ground-glass opacity most notably in the right middle lobe and right upper lobe. This is typically seen with partial airspace filling process such as edema, inflammations/alveolitis. It could also be seen with constrictive bronchiolitis, hypersensitivity pneumonitis or cryptogenic organizing pneumonia. 3. Stable area of rounded atelectasis at the left lung base. 4. Stable advanced vascular disease. 5. No mediastinal or hilar mass or adenopathy. Stable scattered calcified and noncalcified nodes. 6. Stable surgical changes from TAVR and coronary artery bypass surgery.    Patient Profile     89 98o. male with history of CAD status post CABG (multiple interventions to  vein grafts and native CAD), left bundle branch block, persistent atrial fibrillation on Eliquis, systolic heart failure EF 35 to 40%, CKD, diabetes, hypertension, advanced age who was admitted on 10/13/2022 for weakness and fatigue. Cardiology was consulted for bradycardia.     Assessment & Plan    Acute systolic HF:   Intake and output is incomplete.   Improved urine output.  Holding off  further diuretics.  CXR today without change in appearance of probable pneumonia.   Hyponatremia:    Na is back down.  Creat is improved.  Given his good response to NaCl for I will give another few doses.    Atrial fib:    Continue Eliquis, hold beta blocker with bradycardia    CAD:   Continue Plavix in addition to Eliquis.   HTN:    BP is labile with orthostasis. No med titration.    Status post TAVR:  Normal function this admission.   See echo above.  Stable function.    CKD IIIb:  Creat is slightly improved.   Disposition:  Discussed code status. Patient is now DNR.   Might not qualify for inpatient rehab.  Would need help at home with nursing/PT.  High risk for falls and readmission.     For questions or updates, please contact [place] Please consult [email or web address] for contact info under Cardiology/STEMI.    Signed, [name], MD  10/21/2022, 9:15 AM

## 2022-10-22 DIAGNOSIS — J101 Influenza due to other identified influenza virus with other respiratory manifestations: Secondary | ICD-10-CM | POA: Diagnosis not present

## 2022-10-22 LAB — CBC
HCT: 34.5 % — ABNORMAL LOW (ref 39.0–52.0)
Hemoglobin: 11.8 g/dL — ABNORMAL LOW (ref 13.0–17.0)
MCH: 27.3 pg (ref 26.0–34.0)
MCHC: 34.2 g/dL (ref 30.0–36.0)
MCV: 79.7 fL — ABNORMAL LOW (ref 80.0–100.0)
Platelets: 201 10*3/uL (ref 150–400)
RBC: 4.33 MIL/uL (ref 4.22–5.81)
RDW: 18.4 % — ABNORMAL HIGH (ref 11.5–15.5)
WBC: 10.7 10*3/uL — ABNORMAL HIGH (ref 4.0–10.5)
nRBC: 0 % (ref 0.0–0.2)

## 2022-10-22 LAB — GLUCOSE, CAPILLARY
Glucose-Capillary: 133 mg/dL — ABNORMAL HIGH (ref 70–99)
Glucose-Capillary: 148 mg/dL — ABNORMAL HIGH (ref 70–99)
Glucose-Capillary: 213 mg/dL — ABNORMAL HIGH (ref 70–99)
Glucose-Capillary: 94 mg/dL (ref 70–99)

## 2022-10-22 LAB — BASIC METABOLIC PANEL
Anion gap: 8 (ref 5–15)
BUN: 33 mg/dL — ABNORMAL HIGH (ref 8–23)
CO2: 21 mmol/L — ABNORMAL LOW (ref 22–32)
Calcium: 10.3 mg/dL (ref 8.9–10.3)
Chloride: 96 mmol/L — ABNORMAL LOW (ref 98–111)
Creatinine, Ser: 1.47 mg/dL — ABNORMAL HIGH (ref 0.61–1.24)
GFR, Estimated: 46 mL/min — ABNORMAL LOW (ref >60)
Glucose, Bld: 140 mg/dL — ABNORMAL HIGH (ref 70–99)
Potassium: 4.8 mmol/L (ref 3.5–5.1)
Sodium: 125 mmol/L — ABNORMAL LOW (ref 135–145)

## 2022-10-22 LAB — LACTIC ACID, PLASMA: Lactic Acid, Venous: 0.9 mmol/L (ref 0.5–1.9)

## 2022-10-22 MED ORDER — SODIUM CHLORIDE 1 G PO TABS
1.0000 g | ORAL_TABLET | Freq: Two times a day (BID) | ORAL | Status: DC
  Administered 2022-10-22 – 2022-10-25 (×3): 1 g via ORAL
  Filled 2022-10-22 (×5): qty 1

## 2022-10-22 NOTE — Plan of Care (Signed)
  Problem: Clinical Measurements: Goal: Will remain free from infection Outcome: Progressing   Problem: Skin Integrity: Goal: Risk for impaired skin integrity will decrease Outcome: Progressing

## 2022-10-22 NOTE — Progress Notes (Addendum)
Physical Therapy Treatment Patient Details Name: [name] MRN: [id number] DOB: 1933-01-18 Today's Date: 10/22/2022   History of Present Illness Patient 89 y.o. who presented pm 12/27 with weakness and bradycardia, + flu. Pt with recent  PNA, RSV and COVID 19.  PMH includes HTN, HLD, PAF on Eliquis, CHF, CAD s/p CABG, DM type II, prostate cancer.    PT Comments    Pt lethargic and inconsistently following simple commands today, thereby requiring increased assistance for all functional mobility. As pt may go home with hospice, focused session on educating pt's wife on preventing pressure ulcers, repositioning of pt, instructing and assisting pt with bed mobility, proper body mechanics for caregiver safety, and instructing pt on ROM exercises to reduce deconditioning. Provided her with handouts and written instructions as well. Will continue to follow acutely.     Recommendations for follow up therapy are one component of a multi-disciplinary discharge planning process, led by the attending physician.  Recommendations may be updated based on patient status, additional functional criteria and insurance authorization.  Follow Up Recommendations  Skilled nursing-short term rehab (<3 hours/day) (plan may be to go home with hospice instead) Can patient physically be transported by private vehicle: No   Assistance Recommended at Discharge Frequent or constant Supervision/Assistance  Patient can return home with the following A lot of help with walking and/or transfers;A lot of help with bathing/dressing/bathroom;Assistance with cooking/housework;Direct supervision/assist for medications management;Direct supervision/assist for financial management;Assist for transportation;Help with stairs or ramp for entrance;Assistance with feeding   Equipment Recommendations  Rolling walker (2 wheels);BSC/3in1;Wheelchair (measurements PT);Wheelchair cushion (measurements PT);Hospital bed;Other (comment) (hoyer;  air mattress)    Recommendations for Other Services       Precautions / Restrictions Precautions Precautions: Fall Precaution Comments: watch BP Restrictions Weight Bearing Restrictions: No     Mobility  Bed Mobility Overal bed mobility: Needs Assistance Bed Mobility: Supine to Sit, Sit to Supine     Supine to sit: Mod assist, HOB elevated Sit to supine: Mod assist, HOB elevated   General bed mobility comments: Cues to bring each legs off EOB with minA to manage them but modA to ascend trunk and scoot hips to EOB using bed pad. ModA to direct trunk and lift legs onto bed to return to supine. MaxA to roll, cuing pt to flex knee and reach ipsilateral UE to contralateral rail to roll, poor initiation by pt. Educated pt's wife throughout on how to facilitate and assist pt in all bed mobility and in positioning pt in bed and use of bed controls to assist.    Transfers Overall transfer level: Needs assistance Equipment used: 1 person hand held assist Transfers: Sit to/from Stand Sit to Stand: Mod assist           General transfer comment: Pt cued to hold onto therapist anterior to him and pull to stand, modA to complete and pivot hips towards [name]    Ambulation/Gait               General Gait Details: deferred   Stairs             Wheelchair Mobility    Modified Rankin (Stroke Patients Only)       Balance Overall balance assessment: Needs assistance Sitting-balance support: Feet supported, Single extremity supported, Bilateral upper extremity supported Sitting balance-Leahy Scale: Poor Sitting balance - Comments: R lateral lean, needing min guard-minA and UE support for static sitting balance Postural control: Right lateral lean Standing balance support: Bilateral upper extremity  supported Standing balance-Leahy Scale: Poor Standing balance comment: Reliant on UE support and modA                            Cognition Arousal/Alertness:  Lethargic Behavior During Therapy: Flat affect Overall Cognitive Status: Impaired/Different from baseline Area of Impairment: Problem solving, Awareness, Following commands, Attention                   Current Attention Level: Sustained Memory: Decreased short-term memory Following Commands: Follows one step commands with increased time, Follows one step commands inconsistently   Awareness: Intellectual Problem Solving: Slow processing, Requires verbal cues, Requires tactile cues, Decreased initiation, Difficulty sequencing General Comments: Pt lethargic, keeping eyes closed but not really sleeping as he would mumble and chew intermittently. Needs repeated simple multi-modal cues for all tasks. inconsistently follows commands with poor initiation. often reaches out to the sky but not sure what he is reaching for, which pt's wife says he has been doing all day.        Exercises      General Comments General comments (skin integrity, edema, etc.): monitor on standby upon arrival and would continually return to standby and even when PT would press it to remain on to monitor BP; Educated pt's wife on ROM exercises, repositioning for pressure relief and wound prevention (provided handouts), and on proper body mechanics to assist pt safely; requested RN to order prevalon boots for pt as his heels were noted to be red      Pertinent Vitals/Pain Pain Assessment Pain Assessment: Faces Faces Pain Scale: Hurts a little bit Pain Location: generally to move Pain Descriptors / Indicators: Guarding Pain Intervention(s): Limited activity within patient's tolerance, Monitored during session, Repositioned    Home Living                          Prior Function            PT Goals (current goals can now be found in the care plan section) Acute Rehab PT Goals Patient Stated Goal: to go home PT Goal Formulation: With patient/family Time For Goal Achievement: 10/28/22 Potential  to Achieve Goals: Fair Progress towards PT goals: Not progressing toward goals - comment (decline in function)    Frequency    Min 3X/week      PT Plan Equipment recommendations need to be updated;Discharge plan needs to be updated    Co-evaluation              AM-PAC PT "6 Clicks" Mobility   Outcome Measure  Help needed turning from your back to your side while in a flat bed without using bedrails?: A Lot Help needed moving from lying on your back to sitting on the side of a flat bed without using bedrails?: A Lot Help needed moving to and from a bed to a chair (including a wheelchair)?: A Lot Help needed standing up from a chair using your arms (e.g., wheelchair or bedside chair)?: A Lot Help needed to walk in hospital room?: Total Help needed climbing 3-5 steps with a railing? : Total 6 Click Score: 10    End of Session   Activity Tolerance: Patient limited by lethargy Patient left: with call bell/phone within reach;with family/visitor present;in bed;with bed alarm set;with nursing/sitter in room Nurse Communication: Mobility status;Other (comment) (need for prevalon boots) PT Visit Diagnosis: Muscle weakness (generalized) (M62.81);Difficulty in walking, not elsewhere classified (  R26.2);Unsteadiness on feet (R26.81)     Time: 1710-1756 PT Time Calculation (min) (ACUTE ONLY): 46 min  Charges:  $Therapeutic Activity: 38-52 mins                     Moishe Spice, PT, DPT Acute Rehabilitation Services  Office: [phone]    Orvan Falconer 10/22/2022, 6:17 PM

## 2022-10-22 NOTE — Progress Notes (Signed)
Progress Note  Patient Name: [name] Date of Encounter: 10/22/2022  Primary Cardiologist:   [name], MD   Subjective   No chest pain.  Very weak and confused.  No chest pain.   Inpatient Medications    Scheduled Meds:  (feeding supplement) PROSource Plus  30 mL Oral BID BM   amLODipine  5 mg Oral Daily   apixaban  2.5 mg Oral BID   Chlorhexidine Gluconate Cloth  6 each Topical Daily   clopidogrel  75 mg Oral Daily   docusate sodium  100 mg Oral BID   feeding supplement (GLUCERNA SHAKE)  237 mL Oral TID BM   insulin aspart  0-15 Units Subcutaneous TID WC   insulin glargine-yfgn  10 Units Subcutaneous QPM   mometasone-formoterol  2 puff Inhalation BID   multivitamin with minerals  1 tablet Oral Daily   pantoprazole  40 mg Oral Daily   rosuvastatin  20 mg Oral Daily   sodium chloride flush  3 mL Intravenous Q12H   Continuous Infusions:  PRN Meds: acetaminophen **OR** acetaminophen, albuterol, bisacodyl, guaiFENesin, guaiFENesin-dextromethorphan, hydrALAZINE, melatonin, ondansetron **OR** ondansetron (ZOFRAN) IV, mouth rinse, oxyCODONE, polyethylene glycol   Vital Signs    Vitals:   10/21/22 1116 10/21/22 2006 10/21/22 2118 10/22/22 0503  BP: 118/75  118/60 122/75  Pulse: 65   85  Resp: '18  17 18  '$ Temp: 98.8 F (37.1 C)  98.3 F (36.8 C) 98.8 F (37.1 C)  TempSrc: Axillary  Axillary Oral  SpO2: 97% 97%  98%  Weight:    61.1 kg  Height:        Intake/Output Summary (Last 24 hours) at 10/22/2022 0800 Last data filed at 10/22/2022 0515 Gross per 24 hour  Intake 577 ml  Output 950 ml  Net -373 ml   Filed Weights   10/19/22 0515 10/20/22 0413 10/22/22 0503  Weight: 62.2 kg 62.8 kg 61.1 kg    Telemetry    Off tele currently - Personally Reviewed  ECG    NA - Personally Reviewed  Physical Exam   GEN: No  acute distress.   Neck: No  JVD Cardiac: Irregular RR, systolic murmur at the apex, no diastolic Respiratory:     Decreased breath  sounds, diffuse coarse crackles.  GI: Soft, nontender, non-distended, normal bowel sounds  MS:  No edema; No deformity. Neuro:   Nonfocal  Psych: Oriented and appropriate      Labs    Chemistry Recent Labs  Lab 10/19/22 0856 10/20/22 0605 10/21/22 0617 10/22/22 0530  NA 124* 127* 125* 125*  K 5.3* 4.8 4.8 4.8  CL 94* 95* 95* 96*  CO2 17* 23 21* 21*  GLUCOSE 139* 84 97 140*  BUN 36* 37* 37* 33*  CREATININE 1.90* 1.73* 1.63* 1.47*  CALCIUM 10.1 10.5* 10.0 10.3  PROT 6.9  --   --   --   ALBUMIN 2.4*  --   --   --   AST 22  --   --   --   ALT 30  --   --   --   ALKPHOS 132*  --   --   --   BILITOT 1.0  --   --   --   GFRNONAA 34* 38* 41* 46*  ANIONGAP '13 9 9 8     '$ Hematology Recent Labs  Lab 10/20/22 0605 10/21/22 0617 10/22/22 0530  WBC 11.9* 9.7 10.7*  RBC 4.31 4.17* 4.33  HGB 11.6* 11.3* 11.8*  HCT 35.3* 33.6* 34.5*  MCV 81.9 80.6 79.7*  MCH 26.9 27.1 27.3  MCHC 32.9 33.6 34.2  RDW 18.7* 18.4* 18.4*  PLT 172 194 201    Cardiac EnzymesNo results for input(s): "TROPONINI" in the last 168 hours. No results for input(s): "TROPIPOC" in the last 168 hours.   BNP No results for input(s): "BNP", "PROBNP" in the last 168 hours.    DDimer No results for input(s): "DDIMER" in the last 168 hours.   Radiology    DG CHEST PORT 1 VIEW  Result Date: 10/21/2022 CLINICAL DATA:  Cough and congestion.  Flu and COVID. EXAM: PORTABLE CHEST 1 VIEW COMPARISON:  10/19/2022 FINDINGS: Right-sided PICC line at superior caval/atrial junction. Patient rotated to the right with prior median sternotomy for CABG. Mild cardiomegaly. Atherosclerosis in the transverse aorta. No pleural effusion or pneumothorax. Mild interstitial edema. Similar appearance of right greater than left, lower and peripheral predominant airspace disease. IMPRESSION: No significant change since 10/19/2022. Multifocal peripheral and basilar predominant pulmonary opacities, favoring pneumonia. Cardiomegaly and mild  interstitial edema. Aortic Atherosclerosis (ICD10-I70.0). Electronically Signed   By: Abigail Miyamoto M.D.   On: 10/21/2022 09:21    Cardiac Studies   TTE 10/13/2022  1. Left ventricular ejection fraction, by estimation, is 25 to 30%. The  left ventricle has severely decreased function. The left ventricle  demonstrates global hypokinesis that appears worse in the septal segments.  There is moderate concentric left  ventricular hypertrophy. Left ventricular diastolic parameters are  indeterminate.   2. Right ventricular systolic function is severely reduced. The right  ventricular size is normal. PASP is 39.57mHg +RAP.   3. Left atrial size was severely dilated.   4. Right atrial size was severely dilated.   5. The mitral valve is degenerative. Mild mitral valve regurgitation.  Moderate to severe mitral annular calcification.   6. The aortic valve has been repaired/replaced. Aortic valve  regurgitation is trivial. There is a 29 mm Medtronic valve present in the  aortic position. Procedure Date: 04/28/21. Mean gradient 1.425mg with Vmax  0.28m12min the setting of low output state.  LVOT VTI 12.4.   7. Aortic dilatation noted. There is mild dilatation of the ascending  aorta, measuring 40 mm.    Chest CT 10/14/2022 IMPRESSION: 1. Stable emphysematous changes and areas of pulmonary scarring. 2. Significant areas of ground-glass opacity most notably in the right middle lobe and right upper lobe. This is typically seen with partial airspace filling process such as edema, inflammations/alveolitis. It could also be seen with constrictive bronchiolitis, hypersensitivity pneumonitis or cryptogenic organizing pneumonia. 3. Stable area of rounded atelectasis at the left lung base. 4. Stable advanced vascular disease. 5. No mediastinal or hilar mass or adenopathy. Stable scattered calcified and noncalcified nodes. 6. Stable surgical changes from TAVR and coronary artery bypass surgery.     Patient Profile     89 108o. male with history of CAD status post CABG (multiple interventions to vein grafts and native CAD), left bundle branch block, persistent atrial fibrillation on Eliquis, systolic heart failure EF 35 to 40%, CKD, diabetes, hypertension, advanced age who was admitted on 10/13/2022 for weakness and fatigue. Cardiology was consulted for bradycardia.     Assessment & Plan    Acute systolic HF:   Intake and output is incomplete.   Minimal output but he is not taking a great deal of PO.  Avoiding diuretic for now as he is taking in little PO and BPs have been labile as well  as creat increased.  Seems to be euvolemic to hypovolemic.       Hyponatremia:    Na is stable low.  Creat is continuing to improve.  Given his good response to NaCl I will continue it for now and follow closely as an out patient.   Atrial fib:    Continue Eliquis, holding beta blocker  CAD:   Continue Plavix and Eliquis  HTN:    BP is labile with orthostasis.  We have been holding off on most of his meds for now and I will titrate those later as an out patient.   Status post TAVR:  Normal function this admission.   See echo above.  Stable function.    CKD IIIb:  Creat is slightly improved.   Disposition:  Discussed code status. Patient is now DNR.   Appreciate palliative involvement.   Overall he is declining and hospice care would also be appropriate.  I spoke with his wife about this today.    For questions or updates, please contact [place] Please consult [email or web address] for contact info under Cardiology/STEMI.   Signed, [name], MD  10/22/2022, 8:00 AM

## 2022-10-22 NOTE — Evaluation (Signed)
Clinical/Bedside Swallow Evaluation Patient Details  Name: [name] MRN: [id number] Date of Birth: January 10, 1933  Today's Date: 10/22/2022 Time: SLP Start Time (ACUTE ONLY): 1026 SLP Stop Time (ACUTE ONLY): 1040 SLP Time Calculation (min) (ACUTE ONLY): 14 min  Past Medical History:  Past Medical History:  Diagnosis Date   Aortic stenosis, moderate 09/20/2020   Arthritis    CAD (coronary artery disease)    a. Cath September 2015 LIMA to the LAD patent, SVG to PDA patent, SVG to posterior lateral patent, SVG to OM with a 90% in-stent restenosis at an anastomotic lesion. This was treated with angioplasty. b. cath 04/01/2015 95% ISR in SVG to OM treated with 2.75x24 Synergy DES postdilated to 3.60m, all other grafts patent   Cataract    surgery,B/L   CHF (congestive heart failure) (HCC)    Chronic kidney disease    nephrolithiasis   Diabetes mellitus    TYPE 2   GERD (gastroesophageal reflux disease)    Hernia    Hypercholesterolemia    Hypertension    Incontinence    hx  over 1 year,leaks without awareness   Other and unspecified diseases of appendix    PAF (paroxysmal atrial fibrillation) ([place]    in the setting of ischemia on 03/31/2015   Pancreatitis    Prostate [place] ([place] 03/01/04   prostate bx=Adenocarcinoam,gleason 3+4=7,PSA=6.75   Shortness of breath dyspnea    Ulcer    hx gastric   Past Surgical History:  Past Surgical History:  Procedure Laterality Date   APPENDECTOMY     BACK SURGERY     CARDIAC CATHETERIZATION N/A 04/01/2015   Procedure: Left Heart Cath and Cors/Grafts Angiography;  Surgeon: JJettie Booze MD;  [facility];  Service: Cardiovascular;  Laterality: N/A;   CARDIAC CATHETERIZATION N/A 04/01/2015   Procedure: Coronary Stent Intervention;  Surgeon: JJettie Booze MD;  [place] [facility];  Service: Cardiovascular;  Laterality: N/A;   CARDIOVERSION N/A 11/01/2019   Procedure: CARDIOVERSION;  Surgeon: OGeralynn Rile  MD;  [place]  Service: Cardiovascular;  Laterality: N/A;   CARPAL TUNNEL RELEASE  2004   both hands   CORONARY ARTERY BYPASS GRAFT     CORONARY BALLOON ANGIOPLASTY N/A 08/19/2021   Procedure: CORONARY BALLOON ANGIOPLASTY;  Surgeon: MBurnell Blanks MD;  [facility];  Service: Cardiovascular;  Laterality: N/A;   CORONARY STENT INTERVENTION N/A 09/23/2020   Procedure: CORONARY STENT INTERVENTION;  Surgeon: CSherren Mocha MD;  [place];  Service: Cardiovascular;  Laterality: N/A;   CYSTOSCOPY  08/23/12   incomplete emoptying bladder   HERNIA REPAIR     incision and drainage of right chest abscess     INTRAOPERATIVE TRANSTHORACIC ECHOCARDIOGRAM Left 04/28/2021   Procedure: INTRAOPERATIVE TRANSTHORACIC ECHOCARDIOGRAM;  Surgeon: MBurnell Blanks MD;  [place]  Service: Open Heart Surgery;  Laterality: Left;   LAPAROSCOPIC CHOLECYSTECTOMY  09/2009   LEFT HEART CATH AND CORS/GRAFTS ANGIOGRAPHY N/A 08/19/2021   Procedure: LEFT HEART CATH AND CORS/GRAFTS ANGIOGRAPHY;  Surgeon: MBurnell Blanks MD;  [facility];  Service: Cardiovascular;  Laterality: N/A;   LEFT HEART CATHETERIZATION WITH CORONARY ANGIOGRAM N/A 07/19/2014   Procedure: LEFT HEART CATHETERIZATION WITH CORONARY ANGIOGRAM;  Surgeon: DLeonie Man MD;  [facility];  Service: Cardiovascular;  Laterality: N/A;   RECTAL SURGERY     RIGHT/LEFT HEART CATH AND CORONARY/GRAFT ANGIOGRAPHY N/A 09/23/2020   Procedure: RIGHT/LEFT HEART CATH  AND CORONARY/GRAFT ANGIOGRAPHY;  Surgeon: Sherren Mocha, MD;  Location: [place] [facility];  Service: Cardiovascular;  Laterality: N/A;   ROBOT ASSISTED LAPAROSCOPIC RADICAL PROSTATECTOMY  2005   TRANSCATHETER AORTIC VALVE REPLACEMENT, TRANSFEMORAL Bilateral 04/28/2021   Procedure: TRANSCATHETER AORTIC VALVE REPLACEMENT, TRANSFEMORAL;  Surgeon: Burnell Blanks, MD;  Location: [place];  Service: Open  Heart Surgery;  Laterality: Bilateral;   ULTRASOUND GUIDANCE FOR VASCULAR ACCESS Bilateral 04/28/2021   Procedure: ULTRASOUND GUIDANCE FOR VASCULAR ACCESS;  Surgeon: Burnell Blanks, MD;  Location: [place];  Service: Open Heart Surgery;  Laterality: Bilateral;   HPI:  Patient 89 y.o. who presented pm 12/27 with weakness and bradycardia, + flu. Pt with recent  PNA, RSV and COVID 19.  PMH includes HTN, HLD, PAF on Eliquis, CHF, CAD s/p CABG, DM type II, prostate cancer.    Assessment / Plan / Recommendation  Clinical Impression  Wife at bedside and states prolonged mastication with mild pocketing recently. Pt alert but appears overall weak, follows commands with suspect slower processing. He had a congested cough prior to giving po's as therapist repositioning. Most dentition is intact missing some upper and posterior with functional facial/lingual ROM and strength. Pt continued with intermittent coughs throughout evaluation that did not coorelate to po swallows. Cough did not increase in intensity and his respirations remained stable. Oral prep and transit appeared slower with delayed mastication with cracker but able to clear. Discussed option of chopped meats with wife and she was agreeable. SLP downgraded to Dys 3, continue thin, pills whole in puree and educated re: general strategies to promote safety. Pt does not require ST follow up at this time. He can be upgraded via MD. SLP Visit Diagnosis: Dysphagia, unspecified (R13.10)    Aspiration Risk  Mild aspiration risk    Diet Recommendation Dysphagia 3 (Mech soft);Thin liquid   Liquid Administration via: Cup;Straw Medication Administration: Whole meds with puree Supervision: Full supervision/cueing for compensatory strategies;Staff to assist with self feeding Compensations: Slow rate;Small sips/bites Postural Changes: Seated upright at 90 degrees    Other  Recommendations Oral Care Recommendations: Oral care BID    Recommendations  for follow up therapy are one component of a multi-disciplinary discharge planning process, led by the attending physician.  Recommendations may be updated based on patient status, additional functional criteria and insurance authorization.  Follow up Recommendations No SLP follow up      Assistance Recommended at Discharge    Functional Status Assessment Patient has not had a recent decline in their functional status  Frequency and Duration            Prognosis        Swallow Study   General Date of Onset: 10/13/22 HPI: Patient 89 y.o. who presented pm 12/27 with weakness and bradycardia, + flu. Pt with recent  PNA, RSV and COVID 19.  PMH includes HTN, HLD, PAF on Eliquis, CHF, CAD s/p CABG, DM type II, prostate cancer. Type of Study: Bedside Swallow Evaluation Previous Swallow Assessment:  (no) Diet Prior to this Study: Thin liquids;Other (Comment) (soft) Temperature Spikes Noted: No Respiratory Status: Room air History of Recent Intubation: No Behavior/Cognition: Alert;Cooperative;Pleasant mood;Other (Comment) (deconditioned, weak) Oral Cavity Assessment: Dry Oral Care Completed by SLP: No Oral Cavity - Dentition: Missing dentition;Other (Comment) (missing upper/lower posterior) Vision: Functional for self-feeding Self-Feeding Abilities: Needs assist Patient Positioning: Upright in bed Baseline Vocal Quality: Normal Volitional Cough: Strong;Congested Volitional Swallow: Able to elicit    Oral/Motor/Sensory Function Overall Oral Motor/Sensory Function: Within  functional limits   Ice Chips Ice chips: Not tested   Thin Liquid Thin Liquid: Impaired Presentation: Cup Pharyngeal  Phase Impairments: Cough - Immediate    Nectar Thick Nectar Thick Liquid: Not tested   Honey Thick Honey Thick Liquid: Not tested   Puree Puree: Within functional limits   Solid     Solid: Impaired Oral Phase Functional Implications: Other (comment) (prolonged mastication) Pharyngeal Phase  Impairments:  (none)      Bryar Rennie, Orbie Pyo 10/22/2022,11:05 AM

## 2022-10-22 NOTE — TOC Progression Note (Signed)
Transition of Care [facility]) - Progression Note    Patient Details  Name: [name] MRN: [id number] Date of Birth: 12-17-1932  Transition of Care [facility]) CM/SW [place], LCSW Phone Number: 10/22/2022, 3:47 PM  Clinical Narrative:    CSW spoke with daughter and daughter requested stated that SNF was till an option and would like for insurance auth to be started. CSW started insurance auth for [facility].    Expected Discharge Plan: [place] Barriers to Discharge: Continued Medical Work up  Expected Discharge Plan and Services       Living arrangements for the past 2 months: Single Family Home                                       Social Determinants of Health (SDOH) Interventions SDOH Screenings   Food Insecurity: No Food Insecurity (08/26/2022)  Housing: [place]  (03/02/2021)  Transportation Needs: No Transportation Needs (08/26/2022)  Alcohol Screen: Low Risk  (03/02/2021)  Depression (PHQ2-9): High Risk (10/01/2022)  Financial Resource Strain: Low Risk  (03/29/2022)  Physical Activity: Inactive (03/29/2022)  Social Connections: Socially Integrated (03/02/2021)  Stress: No Stress Concern Present (03/29/2022)  Tobacco Use: Medium Risk (10/13/2022)    Readmission Risk Interventions    02/08/2022   10:44 AM  Readmission Risk Prevention Plan  Transportation Screening Complete  PCP or Specialist Appt within 3-5 Days Complete  HRI or [place] Complete  Social Work Consult for [place] Planning/Counseling [place] Not Applicable  Medication Review Press photographer) Complete  Beckey Rutter, MSW, LCSWA, LCASA Transitions of Care  Clinical Social Worker I

## 2022-10-22 NOTE — Progress Notes (Signed)
                                                     Palliative Care Progress Note, Assessment & Plan   Patient Name: [name]       Date: 10/22/2022 DOB: 01-26-1933  Age: 89 y.o. MRN#: [id number] Attending Physician: Domenic Polite, MD Primary Care Physician: [name], MD Admit Date: 10/13/2022  After reviewing the patient's chart, I spoke with  After reviewing the patient's chart, I secure chatted with attending Dr. Broadus John and TOC SW [place].  After speaking with cardiology, patient's wife has decided to have patient discharge with hospice at home. [facility] and TOC following closely to assist with disposition.  No acute palliative needs at this time. PMT will remain available to patient and family throughout his hospitalization.  Beulah Ilsa Iha, FNP-BC Palliative Medicine Team Team Phone # [phone]

## 2022-10-22 NOTE — TOC Progression Note (Signed)
Transition of Care [facility]) - Progression Note    Patient Details  Name: [name] MRN: [id number] Date of Birth: 1933-07-10  Transition of [facility]) CM/SW Contact  Zenon Mayo, RN Phone Number: 10/22/2022, 11:39 AM  Clinical Narrative:    NCM  informed from MD that patient spoke with Cardiologist and think she is open to Home with Hospice, so would like for this NCM to speak with wife to see if she wants Home with Hospice.  NCM spoke with wife who was at the bedside ans asked if she was wanting SNF for dispo or Home with Hospice, she states she would like Home with Hospice.  NCM offered choice, she chose [facility].  NCM made referral to Cheri with [facility] for [facility].  She will give the wife a call also. Wife states he has a walker and a cane at home. He may need a hospital bed and ambulance transport.  NCM will await for Cheri to speak with wife.    Expected Discharge Plan: [place] Barriers to Discharge: Continued Medical Work up  Expected Discharge Plan and Services       Living arrangements for the past 2 months: Single Family Home                                       Social Determinants of Health (SDOH) Interventions SDOH Screenings   Food Insecurity: No Food Insecurity (08/26/2022)  Housing: [place]  (03/02/2021)  Transportation Needs: No Transportation Needs (08/26/2022)  Alcohol Screen: Low Risk  (03/02/2021)  Depression (PHQ2-9): High Risk (10/01/2022)  Financial Resource Strain: Low Risk  (03/29/2022)  Physical Activity: Inactive (03/29/2022)  Social Connections: Socially Integrated (03/02/2021)  Stress: No Stress Concern Present (03/29/2022)  Tobacco Use: Medium Risk (10/13/2022)    Readmission Risk Interventions    02/08/2022   10:44 AM  Readmission Risk Prevention Plan  Transportation Screening Complete  PCP or Specialist Appt within 3-5 Days Complete  HRI or [place] Complete   Social Work Consult for [place] Planning/Counseling Complete  Palliative Care Screening Not Applicable  Medication Review Press photographer) Complete

## 2022-10-22 NOTE — Plan of Care (Signed)
  Problem: Skin Integrity: Goal: Risk for impaired skin integrity will decrease Outcome: Progressing   

## 2022-10-22 NOTE — Progress Notes (Signed)
   Referral received from [facility] for hospice care at home. I have reviewed the chart and discussed with Dr. Domingo Cocking out MD and pt was approved for hospice care at home.   Spoke to the pts wife and introduced her to hospice care. The wife interested in the services but has concerns that even with hospice care services offered she may not be able to care for him at home alone. She has two children but her son lives out of state and her daughter does work full time. She reports that she will discuss the services with them and get there input. I have asked that she call us back and let us know and also to update the CM Neoma Laming of her wishes as well.   Webb Silversmith RN [phone]

## 2022-10-22 NOTE — Progress Notes (Signed)
PROGRESS NOTE    ORIEL RUMBOLD  OZH:[id number] DOB: 04/22/1933 DOA: 10/13/2022 PCP: [name], MD   Mr. Hockett is an 89 y.o. M with pAF on Eliquis, CAD s/p CABG and multiple PCI last >53yr DM, HTN, HLD, sdCHF EF now 25-30%, CKD IIIb baseline 1.7-2.0, COVID earlier this month, and prosCA who presented with weakness and fatigue.  -In the ER, influenza A positive. -Admitted on Tamiflu, CHF with worsening cardiomyopathy, echo noted EF of 25-30%, severely reduced RV function, symptomatic bradycardia,  cards consulted, Coreg discontinued -Treated with diuretics, complicated by hyponatremia, ongoing failure to thrive from well prior to this admission -1/2:  for PICC line placed -1/3, sodium improving, creatinine stable, palliative consulted -1/4: Sodium down marginally, cards restarting salt tabs -1/5: Home hospice recommended  Subjective: -Weak, oral intake is poor, some confusion  Assessment and Plan:  Influenza A -Completed 5 days Tamiflu.   - Oral intake low, mobility poor.  Complicated by ongoing failure to thrive.     Failure to thrive in adult -Per wife had ongoing decline few months ago then contracted COVID, declined further, now with influenza A, worsening cardiomyopathy  -Appears euvolemic, oral intake is very poor, diuretics on hold now  -Sodium continues to fluctuate -Palliative consulted for goals of care, original plan was for SNF for short-term rehab, hospice being reconsidered now  Acute on chronic systolic CHF Severe BiV failure -Known cardiomyopathy, echo this admission with EF down further to 25-30%, severe RV failure -Diuresed with IV Lasix initially, now appears euvolemic, Lasix on hold, oral intake is very poor -Cards following -Sodium now 125, salt tabs restarted yesterday  Hyponatremia -Multifactorial in the setting of CHF, diuretics, SSRI etc. -A.m. cortisol is normal, SSRI on hold -See above  ?  Urinary retention -Required I/O cath 2 days ago,  check bladder scan intermittently  NSVT (nonsustained ventricular tachycardia) (HCC) Brief run of 12 beats VT yesterday, asymptomatic. -Mag normal, K> 4  Pressure injury of skin Not present on admission  Thrombocytopenia ([place] Platelets down to 75,000, due to influenza.  Now normalized.  Symptomatic bradycardia Cardiology consulted, BB stopped.  No further work up recommended. - Off Coreg  Anemia Hemoglobin stable   Type 2 diabetes mellitus with hyperlipidemia (HCC) Glucose controlled - Continue sliding scale corrections - Hold Januvia  Stage 3b chronic kidney disease (CKD) (HCC) Baseline 1.7-2, creatinine stable relative to baseline.  Acute kidney injury ruled out  Persistent atrial fibrillation (HCC) Rate controlled - Continue Eliquis - Hold carvedilol  Coronary artery disease of native artery of native heart with stable angina pectoris (HCC) - Continue Eliquis, Crestor - Hold carvedilol, spironolactone  Hypertension BP normal - Continue amlodipine - Hold spironolactone, Coreg, torsemide  DVT prophylaxis: Apixaban Code Status: DNR Family Communication: son at bedside Disposition Plan: Home with hospice versus SNF with palliative care  Consultants: Cardiology, palliative care care   Procedures:   Antimicrobials:    Objective: Vitals:   10/21/22 2118 10/22/22 0503 10/22/22 0907 10/22/22 1150  BP: 118/60 122/75 (!) 136/94 (!) 129/48  Pulse:  85  (!) 53  Resp: '17 18  17  '$ Temp: 98.3 F (36.8 C) 98.8 F (37.1 C)  98.1 F (36.7 C)  TempSrc: Axillary Oral  Axillary  SpO2:  98% 100% 94%  Weight:  61.1 kg    Height:        Intake/Output Summary (Last 24 hours) at 10/22/2022 1208 Last data filed at 10/22/2022 0515 Gross per 24 hour  Intake 337 ml  Output 700 ml  Net -363 ml   Filed Weights   10/19/22 0515 10/20/22 0413 10/22/22 0503  Weight: 62.2 kg 62.8 kg 61.1 kg    Examination:  General exam: Elderly frail chronically ill male laying in bed,  AAO x 2, flat affect HEENT: Positive JVD CVS: S1-S2, regular rhythm Lungs: Decreased breath sounds at the bases  Abdomen: Soft, nontender, bowel sounds present Extremities: Trace edema  Psych: Flat affect   Data Reviewed:   CBC: Recent Labs  Lab 10/17/22 0041 10/19/22 0112 10/20/22 0605 10/21/22 0617 10/22/22 0530  WBC 11.1* 11.8* 11.9* 9.7 10.7*  HGB 12.2* 12.4* 11.6* 11.3* 11.8*  HCT 36.9* 37.7* 35.3* 33.6* 34.5*  MCV 80.7 82.0 81.9 80.6 79.7*  PLT 126* 155 172 194 226   Basic Metabolic Panel: Recent Labs  Lab 10/19/22 0112 10/19/22 0856 10/19/22 1422 10/20/22 0605 10/21/22 0617 10/22/22 0530  NA 122* 124*  --  127* 125* 125*  K 5.1 5.3*  --  4.8 4.8 4.8  CL 95* 94*  --  95* 95* 96*  CO2 19* 17*  --  23 21* 21*  GLUCOSE 136* 139*  --  84 97 140*  BUN 35* 36*  --  37* 37* 33*  CREATININE 1.82* 1.90*  --  1.73* 1.63* 1.47*  CALCIUM 9.9 10.1  --  10.5* 10.0 10.3  MG  --   --  2.0  --   --   --    GFR: Estimated Creatinine Clearance: 30.6 mL/min (A) (by C-G formula based on SCr of 1.47 mg/dL (H)). Liver Function Tests: Recent Labs  Lab 10/19/22 0856  AST 22  ALT 30  ALKPHOS 132*  BILITOT 1.0  PROT 6.9  ALBUMIN 2.4*   No results for input(s): "LIPASE", "AMYLASE" in the last 168 hours. No results for input(s): "AMMONIA" in the last 168 hours. Coagulation Profile: No results for input(s): "INR", "PROTIME" in the last 168 hours. Cardiac Enzymes: No results for input(s): "CKTOTAL", "CKMB", "CKMBINDEX", "TROPONINI" in the last 168 hours. BNP (last 3 results) Recent Labs    11/11/21 1020 02/01/22 1003  PROBNP 1,541.0* 1,846.0*   HbA1C: No results for input(s): "HGBA1C" in the last 72 hours. CBG: Recent Labs  Lab 10/21/22 0609 10/21/22 1113 10/21/22 1610 10/22/22 0600 10/22/22 1031  GLUCAP 94 168* 147* 133* 148*   Lipid Profile: No results for input(s): "CHOL", "HDL", "LDLCALC", "TRIG", "CHOLHDL", "LDLDIRECT" in the last 72 hours.  Thyroid  Function Tests: No results for input(s): "TSH", "T4TOTAL", "FREET4", "T3FREE", "THYROIDAB" in the last 72 hours. Anemia Panel: No results for input(s): "VITAMINB12", "FOLATE", "FERRITIN", "TIBC", "IRON", "RETICCTPCT" in the last 72 hours. Urine analysis:    Component Value Date/Time   COLORURINE YELLOW 10/20/2022 1112   APPEARANCEUR HAZY (A) 10/20/2022 1112   LABSPEC 1.015 10/20/2022 1112   PHURINE 5.0 10/20/2022 1112   GLUCOSEU NEGATIVE 10/20/2022 1112   HGBUR NEGATIVE 10/20/2022 1112   HGBUR trace-intact 11/23/2007 0000   BILIRUBINUR NEGATIVE 10/20/2022 1112   KETONESUR NEGATIVE 10/20/2022 1112   PROTEINUR 100 (A) 10/20/2022 1112   UROBILINOGEN 0.2 02/16/2011 2229   NITRITE NEGATIVE 10/20/2022 1112   LEUKOCYTESUR NEGATIVE 10/20/2022 1112   Sepsis Labs: '@LABRCNTIP'$ (procalcitonin:4,lacticidven:4)  ) Recent Results (from the past 240 hour(s))  Resp panel by RT-PCR (RSV, Flu A&B, Covid) Anterior Nasal Swab     Status: Abnormal   Collection Time: 10/13/22  4:39 AM   Specimen: Anterior Nasal Swab  Result Value Ref Range Status   SARS Coronavirus 2  by RT PCR POSITIVE (A) NEGATIVE Final    Comment: (NOTE) SARS-CoV-2 target nucleic acids are DETECTED.  The SARS-CoV-2 RNA is generally detectable in upper respiratory specimens during the acute phase of infection. Positive results are indicative of the presence of the identified virus, but do not rule out bacterial infection or co-infection with other pathogens not detected by the test. Clinical correlation with patient history and other diagnostic information is necessary to determine patient infection status. The expected result is Negative.  Fact Sheet for Patients: EntrepreneurPulse.com.au  Fact Sheet for Healthcare Providers: IncredibleEmployment.be  This test is not yet approved or cleared by the [place] FDA and  has been authorized for detection and/or diagnosis of SARS-CoV-2  by FDA under an Emergency Use Authorization (EUA).  This EUA will remain in effect (meaning this test can be used) for the duration of  the COVID-19 declaration under Section 564(b)(1) of the A ct, 21 U.S.C. section 360bbb-3(b)(1), unless the authorization is terminated or revoked sooner.     Influenza A by PCR POSITIVE (A) NEGATIVE Final   Influenza B by PCR NEGATIVE NEGATIVE Final    Comment: (NOTE) The Xpert Xpress SARS-CoV-2/FLU/RSV plus assay is intended as an aid in the diagnosis of influenza from Nasopharyngeal swab specimens and should not be used as a sole basis for treatment. Nasal washings and aspirates are unacceptable for Xpert Xpress SARS-CoV-2/FLU/RSV testing.  Fact Sheet for Patients: EntrepreneurPulse.com.au  Fact Sheet for Healthcare Providers: IncredibleEmployment.be  This test is not yet approved or cleared by the [place] FDA and has been authorized for detection and/or diagnosis of SARS-CoV-2 by FDA under an Emergency Use Authorization (EUA). This EUA will remain in effect (meaning this test can be used) for the duration of the COVID-19 declaration under Section 564(b)(1) of the Act, 21 U.S.C. section 360bbb-3(b)(1), unless the authorization is terminated or revoked.     Resp Syncytial Virus by PCR NEGATIVE NEGATIVE Final    Comment: (NOTE) Fact Sheet for Patients: EntrepreneurPulse.com.au  Fact Sheet for Healthcare Providers: IncredibleEmployment.be  This test is not yet approved or cleared by the [place] FDA and has been authorized for detection and/or diagnosis of SARS-CoV-2 by FDA under an Emergency Use Authorization (EUA). This EUA will remain in effect (meaning this test can be used) for the duration of the COVID-19 declaration under Section 564(b)(1) of the Act, 21 U.S.C. section 360bbb-3(b)(1), unless the authorization is terminated or revoked.  Performed at  [facility], [place] [address], [place], [place] [address]   Culture, blood (routine x 2)     Status: None   Collection Time: 10/13/22  6:30 AM   Specimen: BLOOD LEFT FOREARM  Result Value Ref Range Status   Specimen Description BLOOD LEFT FOREARM  Final   Special Requests   Final    BOTTLES DRAWN AEROBIC AND ANAEROBIC Blood Culture results may not be optimal due to an inadequate volume of blood received in culture bottles   Culture   Final    NO GROWTH 5 DAYS Performed at [facility], [place] [address], [place], [place] [address]    Report Status 10/18/2022 FINAL  Final  Culture, blood (routine x 2)     Status: None   Collection Time: 10/13/22  6:39 AM   Specimen: BLOOD RIGHT WRIST  Result Value Ref Range Status   Specimen Description BLOOD RIGHT WRIST  Final   Special Requests   Final    BOTTLES DRAWN AEROBIC AND ANAEROBIC Blood Culture  adequate volume   Culture   Final    NO GROWTH 5 DAYS Performed at [facility], [place] [address], [place], [place] [address]    Report Status 10/18/2022 FINAL  Final  Urine Culture     Status: None   Collection Time: 10/14/22 10:26 AM   Specimen: Urine, Clean Catch  Result Value Ref Range Status   Specimen Description URINE, CLEAN CATCH  Final   Special Requests NONE  Final   Culture   Final    NO GROWTH Performed at [facility], [place] [address] [place],  [address]    Report Status 10/15/2022 FINAL  Final     Radiology Studies: DG CHEST PORT 1 VIEW  Result Date: 10/21/2022 CLINICAL DATA:  Cough and congestion.  Flu and COVID. EXAM: PORTABLE CHEST 1 VIEW COMPARISON:  10/19/2022 FINDINGS: Right-sided PICC line at superior caval/atrial junction. Patient rotated to the right with prior median sternotomy for CABG. Mild cardiomegaly. Atherosclerosis in the transverse aorta. No pleural effusion or pneumothorax. Mild interstitial edema. Similar appearance of right greater than left, lower and peripheral predominant  airspace disease. IMPRESSION: No significant change since 10/19/2022. Multifocal peripheral and basilar predominant pulmonary opacities, favoring pneumonia. Cardiomegaly and mild interstitial edema. Aortic Atherosclerosis (ICD10-I70.0). Electronically Signed   By: Abigail Miyamoto M.D.   On: 10/21/2022 09:21     Scheduled Meds:  (feeding supplement) PROSource Plus  30 mL Oral BID BM   amLODipine  5 mg Oral Daily   apixaban  2.5 mg Oral BID   Chlorhexidine Gluconate Cloth  6 each Topical Daily   clopidogrel  75 mg Oral Daily   docusate sodium  100 mg Oral BID   feeding supplement (GLUCERNA SHAKE)  237 mL Oral TID BM   insulin aspart  0-15 Units Subcutaneous TID WC   insulin glargine-yfgn  10 Units Subcutaneous QPM   mometasone-formoterol  2 puff Inhalation BID   multivitamin with minerals  1 tablet Oral Daily   pantoprazole  40 mg Oral Daily   rosuvastatin  20 mg Oral Daily   sodium chloride flush  3 mL Intravenous Q12H   Continuous Infusions:   LOS: 8 days    Time spent: 38mn    PDomenic Polite MD [facility]   10/22/2022, 12:08 PM

## 2022-10-23 DIAGNOSIS — J101 Influenza due to other identified influenza virus with other respiratory manifestations: Secondary | ICD-10-CM | POA: Diagnosis not present

## 2022-10-23 DIAGNOSIS — Z7189 Other specified counseling: Secondary | ICD-10-CM

## 2022-10-23 DIAGNOSIS — R531 Weakness: Secondary | ICD-10-CM | POA: Diagnosis not present

## 2022-10-23 DIAGNOSIS — Z515 Encounter for palliative care: Secondary | ICD-10-CM | POA: Diagnosis not present

## 2022-10-23 LAB — GLUCOSE, CAPILLARY
Glucose-Capillary: 125 mg/dL — ABNORMAL HIGH (ref 70–99)
Glucose-Capillary: 135 mg/dL — ABNORMAL HIGH (ref 70–99)
Glucose-Capillary: 119 mg/dL — ABNORMAL HIGH (ref 70–99)
Glucose-Capillary: 108 mg/dL — ABNORMAL HIGH (ref 70–99)

## 2022-10-23 LAB — BASIC METABOLIC PANEL
Anion gap: 7 (ref 5–15)
BUN: 28 mg/dL — ABNORMAL HIGH (ref 8–23)
CO2: 21 mmol/L — ABNORMAL LOW (ref 22–32)
Calcium: 10.7 mg/dL — ABNORMAL HIGH (ref 8.9–10.3)
Chloride: 101 mmol/L (ref 98–111)
Creatinine, Ser: 1.47 mg/dL — ABNORMAL HIGH (ref 0.61–1.24)
GFR, Estimated: 46 mL/min — ABNORMAL LOW (ref >60)
Glucose, Bld: 129 mg/dL — ABNORMAL HIGH (ref 70–99)
Potassium: 4.5 mmol/L (ref 3.5–5.1)
Sodium: 129 mmol/L — ABNORMAL LOW (ref 135–145)

## 2022-10-23 MED ORDER — GLYCOPYRROLATE 0.2 MG/ML IJ SOLN: 0.2000 mg | INTRAMUSCULAR | Status: DC | PRN

## 2022-10-23 MED ORDER — FUROSEMIDE 40 MG PO TABS
40.0000 mg | ORAL_TABLET | Freq: Every day | ORAL | Status: DC
  Filled 2022-10-23: qty 1

## 2022-10-23 MED ORDER — FUROSEMIDE 10 MG/ML IJ SOLN
20.0000 mg | Freq: Once | INTRAMUSCULAR | Status: AC
  Administered 2022-10-23: 20 mg via INTRAVENOUS
  Filled 2022-10-23: qty 2

## 2022-10-23 NOTE — Care Management (Signed)
[facility] liaison who has confirmed that DME has been delivered to the home and that the family would to be [facility] tomorrow, updated attending

## 2022-10-23 NOTE — Progress Notes (Signed)
PROGRESS NOTE    [name]  TWS:[id number] DOB: 28-Jan-1933 DOA: 10/13/2022 PCP: Shawn Post, MD   [name] is an 89 y.o. M with pAF on Eliquis, CAD s/p CABG and multiple PCI last >71yr DM, HTN, HLD, sdCHF EF now 25-30%, CKD IIIb baseline 1.7-2.0, COVID earlier this month, and prosCA who presented with weakness and fatigue.  -In the ER, influenza A positive. -Admitted on Tamiflu, CHF with worsening cardiomyopathy, echo noted EF of 25-30%, severely reduced RV function, symptomatic bradycardia,  cards consulted, Coreg discontinued -Treated with diuretics, complicated by hyponatremia, ongoing failure to thrive from well prior to this admission -1/2:  for PICC line placed -1/3, sodium improving, creatinine stable, palliative consulted -1/4: Sodium down marginally, cards restarting salt tabs -1/5: Home hospice recommended, then back-and-forth about rehab  Subjective: -Per wife, confused yesterday, oral intake is poor, had a few bites of pudding this morning  Assessment and Plan:  Failure to thrive in adult -Per wife had ongoing decline few months ago then contracted COVID, declined further, now with influenza A, worsening cardiomyopathy  -Appears euvolemic, oral intake is very poor  -Sodium continues to fluctuate -Palliative consulted for goals of care, original plan was for SNF for short-term rehab, now home hospice being planned after reconsidering SNF yesterday, wife understands that he is not in any position to participate in rehab -Home with hospice services likely Sunday or Monday  Influenza A -Completed 5 days Tamiflu.   - Oral intake poor.  Complicated by ongoing failure to thrive.     Acute on chronic systolic CHF Severe BiV failure -Known cardiomyopathy, echo this admission with EF down further to 25-30%, severe RV failure -Diuresed with IV Lasix initially, then held, oral intake is very poor -Cards following -Sodium now 125, salt tabs restarted  -Appears mildly  overloaded today, restart oral Lasix  Hyponatremia -Multifactorial in the setting of CHF, diuretics, SSRI etc. -A.m. cortisol is normal, SSRI on hold -See above  ?  Urinary retention -Required I/O cath 2 days ago, check bladder scan intermittently  NSVT (nonsustained ventricular tachycardia) (HCC) Brief run of 12 beats VT yesterday, asymptomatic. -Mag normal, K> 4  Pressure injury of skin Not present on admission  Thrombocytopenia ([place] Platelets down to 75,000, due to influenza.  Now normalized.  Symptomatic bradycardia Cardiology consulted, BB stopped.  No further work up recommended. - Off Coreg  Anemia Hemoglobin stable   Type 2 diabetes mellitus with hyperlipidemia (HCC) Glucose controlled - Continue sliding scale corrections - Hold Januvia  Stage 3b chronic kidney disease (CKD) (HCC) Baseline 1.7-2, creatinine stable relative to baseline.  Acute kidney injury ruled out  Persistent atrial fibrillation (HCC) Rate controlled - Continue Eliquis - Hold carvedilol  Coronary artery disease of native artery of native heart with stable angina pectoris (HCC) - Continue Eliquis, Crestor - Hold carvedilol, spironolactone  Hypertension BP normal - Continue amlodipine - Hold spironolactone, Coreg,  DVT prophylaxis: Apixaban Code Status: DNR Family Communication: son at bedside Disposition Plan: Home with hospice in 1 to 2 days  Consultants: Cardiology, palliative care care   Procedures:   Antimicrobials:    Objective: Vitals:   10/22/22 2115 10/23/22 0521 10/23/22 0731 10/23/22 0843  BP: (!) 136/52   (!) 117/49  Pulse: (!) 51 88  92  Resp: '17 17  20  '$ Temp: 98.2 F (36.8 C) (!) 97.4 F (36.3 C)  (!) 97.5 F (36.4 C)  TempSrc: Oral Axillary  Axillary  SpO2: 97% 97% 96% 99%  Weight:  61.7 kg    Height:        Intake/Output Summary (Last 24 hours) at 10/23/2022 1125 Last data filed at 10/23/2022 0527 Gross per 24 hour  Intake --  Output 650 ml  Net  -650 ml   Filed Weights   10/20/22 0413 10/22/22 0503 10/23/22 0521  Weight: 62.8 kg 61.1 kg 61.7 kg    Examination:  General exam: Sleeping in the recliner, somnolent but arousable, oriented to self and place only, cognitive deficits, flat affect HEENT: Positive JVD CVS: S1-S2, regular rhythm Lungs: Decreased breath sounds at the bases Abdomen: Soft, nontender, bowel sounds present Extremities: Trace edema  Psych: Flat affect   Data Reviewed:   CBC: Recent Labs  Lab 10/17/22 0041 10/19/22 0112 10/20/22 0605 10/21/22 0617 10/22/22 0530  WBC 11.1* 11.8* 11.9* 9.7 10.7*  HGB 12.2* 12.4* 11.6* 11.3* 11.8*  HCT 36.9* 37.7* 35.3* 33.6* 34.5*  MCV 80.7 82.0 81.9 80.6 79.7*  PLT 126* 155 172 194 564   Basic Metabolic Panel: Recent Labs  Lab 10/19/22 0856 10/19/22 1422 10/20/22 0605 10/21/22 0617 10/22/22 0530 10/23/22 0618  NA 124*  --  127* 125* 125* 129*  K 5.3*  --  4.8 4.8 4.8 4.5  CL 94*  --  95* 95* 96* 101  CO2 17*  --  23 21* 21* 21*  GLUCOSE 139*  --  84 97 140* 129*  BUN 36*  --  37* 37* 33* 28*  CREATININE 1.90*  --  1.73* 1.63* 1.47* 1.47*  CALCIUM 10.1  --  10.5* 10.0 10.3 10.7*  MG  --  2.0  --   --   --   --    GFR: Estimated Creatinine Clearance: 30.9 mL/min (A) (by C-G formula based on SCr of 1.47 mg/dL (H)). Liver Function Tests: Recent Labs  Lab 10/19/22 0856  AST 22  ALT 30  ALKPHOS 132*  BILITOT 1.0  PROT 6.9  ALBUMIN 2.4*   No results for input(s): "LIPASE", "AMYLASE" in the last 168 hours. No results for input(s): "AMMONIA" in the last 168 hours. Coagulation Profile: No results for input(s): "INR", "PROTIME" in the last 168 hours. Cardiac Enzymes: No results for input(s): "CKTOTAL", "CKMB", "CKMBINDEX", "TROPONINI" in the last 168 hours. BNP (last 3 results) Recent Labs    11/11/21 1020 02/01/22 1003  PROBNP 1,541.0* 1,846.0*   HbA1C: No results for input(s): "HGBA1C" in the last 72 hours. CBG: Recent Labs  Lab  10/22/22 0600 10/22/22 1031 10/22/22 1536 10/22/22 2114 10/23/22 0641  GLUCAP 133* 148* 213* 94 125*   Lipid Profile: No results for input(s): "CHOL", "HDL", "LDLCALC", "TRIG", "CHOLHDL", "LDLDIRECT" in the last 72 hours.  Thyroid Function Tests: No results for input(s): "TSH", "T4TOTAL", "FREET4", "T3FREE", "THYROIDAB" in the last 72 hours. Anemia Panel: No results for input(s): "VITAMINB12", "FOLATE", "FERRITIN", "TIBC", "IRON", "RETICCTPCT" in the last 72 hours. Urine analysis:    Component Value Date/Time   COLORURINE YELLOW 10/20/2022 1112   APPEARANCEUR HAZY (A) 10/20/2022 1112   LABSPEC 1.015 10/20/2022 1112   PHURINE 5.0 10/20/2022 1112   GLUCOSEU NEGATIVE 10/20/2022 1112   HGBUR NEGATIVE 10/20/2022 1112   HGBUR trace-intact 11/23/2007 0000   BILIRUBINUR NEGATIVE 10/20/2022 1112   KETONESUR NEGATIVE 10/20/2022 1112   PROTEINUR 100 (A) 10/20/2022 1112   UROBILINOGEN 0.2 02/16/2011 2229   NITRITE NEGATIVE 10/20/2022 1112   LEUKOCYTESUR NEGATIVE 10/20/2022 1112   Sepsis Labs: '@LABRCNTIP'$ (procalcitonin:4,lacticidven:4)  ) Recent Results (from the past 240 hour(s))  Urine Culture  Status: None   Collection Time: 10/14/22 10:26 AM   Specimen: Urine, Clean Catch  Result Value Ref Range Status   Specimen Description URINE, CLEAN CATCH  Final   Special Requests NONE  Final   Culture   Final    NO GROWTH Performed at [facility], [address] [place], [place] [address]    Report Status 10/15/2022 FINAL  Final     Radiology Studies: No results found.   Scheduled Meds:  (feeding supplement) PROSource Plus  30 mL Oral BID BM   amLODipine  5 mg Oral Daily   apixaban  2.5 mg Oral BID   Chlorhexidine Gluconate Cloth  6 each Topical Daily   clopidogrel  75 mg Oral Daily   docusate sodium  100 mg Oral BID   feeding supplement (GLUCERNA SHAKE)  237 mL Oral TID BM   furosemide  40 mg Oral Daily   insulin aspart  0-15 Units Subcutaneous TID WC   insulin  glargine-yfgn  10 Units Subcutaneous QPM   mometasone-formoterol  2 puff Inhalation BID   multivitamin with minerals  1 tablet Oral Daily   pantoprazole  40 mg Oral Daily   rosuvastatin  20 mg Oral Daily   sodium chloride flush  3 mL Intravenous Q12H   sodium chloride  1 g Oral BID WC   Continuous Infusions:   LOS: 9 days    Time spent: 70mn    PDomenic Polite MD [facility]   10/23/2022, 11:25 AM

## 2022-10-23 NOTE — Progress Notes (Signed)
Daily Progress Note   Patient Name: [name]       Date: 10/23/2022 DOB: 19-Mar-1933  Age: 89 y.o. MRN#: [id number] Attending Physician: Domenic Polite, MD Primary Care Physician: [name], MD Admit Date: 10/13/2022  Reason for Consultation/Follow-up: Establishing goals of care  Subjective: Unresponsive  Length of Stay: 9  Current Medications: Scheduled Meds:   (feeding supplement) PROSource Plus  30 mL Oral BID BM   amLODipine  5 mg Oral Daily   apixaban  2.5 mg Oral BID   Chlorhexidine Gluconate Cloth  6 each Topical Daily   clopidogrel  75 mg Oral Daily   docusate sodium  100 mg Oral BID   feeding supplement (GLUCERNA SHAKE)  237 mL Oral TID BM   furosemide  40 mg Oral Daily   insulin aspart  0-15 Units Subcutaneous TID WC   insulin glargine-yfgn  10 Units Subcutaneous QPM   mometasone-formoterol  2 puff Inhalation BID   multivitamin with minerals  1 tablet Oral Daily   pantoprazole  40 mg Oral Daily   rosuvastatin  20 mg Oral Daily   sodium chloride flush  3 mL Intravenous Q12H   sodium chloride  1 g Oral BID WC    Continuous Infusions:   PRN Meds: acetaminophen **OR** acetaminophen, albuterol, bisacodyl, guaiFENesin, guaiFENesin-dextromethorphan, hydrALAZINE, melatonin, ondansetron **OR** ondansetron (ZOFRAN) IV, mouth rinse, oxyCODONE, polyethylene glycol  Physical Exam Constitutional:      General: He is not in acute distress.    Appearance: He is ill-appearing.     Comments: Does not respond to voice or gentle touch  Pulmonary:     Effort: Pulmonary effort is normal.     Comments: Very congested cough Skin:    General: Skin is warm and dry.             Vital Signs: BP (!) 117/49 (BP Location: Left Arm)   Pulse 92   Temp (!) 97.5 F (36.4 C) (Axillary)    Resp 20   Ht '5\' 9"'$  (1.753 m)   Wt 61.7 kg   SpO2 99%   BMI 20.09 kg/m  SpO2: SpO2: 99 % O2 Device: O2 Device: Room Air O2 Flow Rate: O2 Flow Rate (L/min): 2 L/min  Intake/output summary:  Intake/Output Summary (Last 24 hours) at 10/23/2022 1529 Last data filed at 10/23/2022 0623 Gross per 24 hour  Intake --  Output 650 ml  Net -650 ml   LBM: Last BM Date : 10/20/22 Baseline Weight: Weight: 63.7 kg Most recent weight: Weight: 61.7 kg       Palliative Assessment/Data:PPS 10%      Patient Active Problem List   Diagnosis Date Noted   Failure to thrive in adult 10/19/2022   Pressure injury of skin 10/19/2022   NSVT (nonsustained ventricular tachycardia) (HCC) 10/19/2022   Thrombocytopenia ([place]) 10/14/2022   Gross hematuria 10/14/2022   Symptomatic bradycardia 10/13/2022   Influenza A 10/13/2022   Pneumonia due to COVID-19 virus 09/20/2022   Hyperkalemia 09/20/2022   Acute kidney injury superimposed on chronic kidney disease ([place]) 09/20/2022   Hyponatremia 09/20/2022   Leukocytosis 09/20/2022   Stage 3b chronic kidney disease (CKD) ([place]) 02/06/2022   Type 2 diabetes mellitus  with hyperlipidemia (HCC)    Anemia    Atrial fibrillation, chronic (HCC)    Acute on chronic systolic CHF (congestive heart failure) ([place]) 02/05/2022   Depression, recurrent ([place]) 09/30/2021   NSTEMI (non-ST elevated myocardial infarction) ([place])    S/P TAVR (transcatheter aortic valve replacement) 08/18/2021   Unstable angina ([place]) 08/18/2021   Severe aortic stenosis 04/29/2021   Aortic stenosis, severe 04/27/2021   Angina at rest 09/20/2020   Chronic diastolic (congestive) heart failure ([place]) 09/20/2020   COPD with acute bronchitis ([place]) 09/08/2020   Acute exacerbation of CHF (congestive heart failure) ([place]) 09/07/2020   COPD mixed type ([place]) 85/46/2703   Chronic systolic CHF (congestive heart failure) ([place]) 12/26/2019   Educated about COVID-19 virus infection 10/23/2019   Persistent atrial  fibrillation ([place]) 08/29/2019   Nonrheumatic aortic valve stenosis 08/29/2019   Elevated troponin 07/27/2019   HLD (hyperlipidemia) 07/27/2019   GERD (gastroesophageal reflux disease) 07/27/2019   Congestive heart failure (CHF) ([place]) 07/26/2019   Pure hypercholesterolemia    Acute on chronic diastolic CHF (congestive heart failure) (HCC)    SOB (shortness of breath) 06/04/2019   CKD (chronic kidney disease), stage III ([place]) 06/04/2019   Coronary artery disease of native artery of native heart with stable angina pectoris ([place]) 03/24/2018   Bilateral carotid artery stenosis 07/23/2017   Palpitation 07/23/2017   Urinary urgency 07/28/2016   PAF (paroxysmal atrial fibrillation) (HCC)    DOE (dyspnea on exertion) 07/17/2014   Acute on chronic renal insufficiency- ACE and diuretics held 50/06/3817   Diastolic dysfunction- moderate on echo 2013 07/17/2014   Aortic stenosis 07/17/2014   LBBB (left bundle branch block) 07/17/2014   Chest pain 07/17/2014   Crescendo angina ([place]) 07/16/2014   CKD (chronic kidney disease) stage 4, GFR 15-29 ml/min ([place]) 12/03/2013   Squamous cell cancer of skin of hand 05/31/2013   HEAD TRAUMA, CLOSED 09/24/2010   Ischemic cardiomyopathy- last EF Normal Myoview Jan 2015 04/02/2010   INSOMNIA 04/02/2010   Moderate bilateral ICA disease by doppler June 2015 01/08/2010   DYSPHAGIA UNSPECIFIED 09/05/2009   Hx GERD/PUD 09/03/2009   CHOLELITHIASIS, WITH CHOLECYSTITIS 09/03/2009   ARTHRITIS 07/16/2009   HYPOKALEMIA 11/06/2008   CAD in native artery 11/06/2008   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 ([place]) 11/23/2007   Dyslipidemia 11/23/2007   ROSACEA 11/23/2007   DYSPNEA ON EXERTION 11/23/2007   UNS ADVRS EFF OTH RX MEDICINAL&BIOLOGICAL SBSTNC 11/23/2007   Hypertension 11/22/2007   Hx of Prostate ca 03/01/2004    Palliative Care Assessment & Plan   HPI: 89 y.o. male  with past medical history of PAF (Eliquis, CAD status post CABG  and multiple PCI's, type 2 diabetes (last A1c 6.9), HTN, HLD, ST CHF (EF 25 to 30%), CKD (3B) COVID-positive earlier this month (12/4), and prostate cancer admitted on 10/13/2022 with weakness and fatigue.  On presentation to ED, patient found to be positive for influenza A.  Patient being treated for CHF with [place] ending cardiomyopathy, severely reduced RV function, symptomatic bradycardia, hyponatremia, and FTT.   PMT was consulted to discuss goals of care.  Assessment: Contacted by RN with concerns about disposition. Rn reports throughout last night and today patient seems to be transitioning. Much less responsive and weaker. Could not take pills by mouth this afternoon. Some small sips of PO intake this AM but nothing since.   Met with daughter Sharee Pimple at bedside. She is tearful. She understands he has declined significantly. She expresses her concerns about if patient  can now be cared for at home given his significant decline. We discuss support provided by hospice in the home. We discussed potential care needs. Sharee Pimple will talk to other family members. We discussed seeing how patient does through the night and tomorrow morning. Will request PMT provider follow up tomorrow AM with family regarding concerns.  Recommendations/Plan: Family concerned about disposition - patient has declined significantly in last 24 hours - plan made for PMT provider to follow up in AM and assist family with questions and coordinating with hospice [place] added per nursing request - patient not managing secretions d/t weakness  Code Status: DNR  Prognosis:  < 2 weeks  Discharge Planning: To Be Determined  Care plan was discussed with patient's daughter and RN  Thank you for allowing the Palliative Medicine Team to assist in the care of this patient.   *Please note that this is a verbal dictation therefore any spelling or grammatical errors are due to the "[place] [facility]" system interpretation.  Juel Burrow, DNP, [facility] Palliative Medicine Team Team Phone # [phone]  Pager [phone]

## 2022-10-24 DIAGNOSIS — R627 Adult failure to thrive: Secondary | ICD-10-CM | POA: Diagnosis not present

## 2022-10-24 DIAGNOSIS — N1832 Chronic kidney disease, stage 3b: Secondary | ICD-10-CM | POA: Diagnosis not present

## 2022-10-24 DIAGNOSIS — R001 Bradycardia, unspecified: Secondary | ICD-10-CM | POA: Diagnosis not present

## 2022-10-24 DIAGNOSIS — J189 Pneumonia, unspecified organism: Secondary | ICD-10-CM | POA: Diagnosis not present

## 2022-10-24 LAB — GLUCOSE, CAPILLARY
Glucose-Capillary: 98 mg/dL (ref 70–99)
Glucose-Capillary: 240 mg/dL — ABNORMAL HIGH (ref 70–99)
Glucose-Capillary: 251 mg/dL — ABNORMAL HIGH (ref 70–99)

## 2022-10-24 NOTE — Progress Notes (Addendum)
Patient seen and examined, no changes from my note yesterday, woke up briefly this morning, drank some Glucerna -Continues to decline with FTT -Family having second thoughts about being able to manage him at home with hospice services  -Palliative following  Domenic Polite, MD

## 2022-10-24 NOTE — Progress Notes (Signed)
Palliative Medicine Progress Note   Patient Name: [name]       Date: 10/24/2022 DOB: 23-Nov-1932  Age: 89 y.o. MRN#: [id number] Attending Physician: Domenic Polite, MD Primary Care Physician: [name], MD Admit Date: 10/13/2022    HPI/Patient Profile: 89 y.o. male  with past medical history of PAF (Eliquis, CAD status post CABG and multiple PCI's, type 2 diabetes (last A1c 6.9), HTN, HLD, ST CHF (EF 25 to 30%), CKD (3B) COVID-positive earlier this month (12/4), and prostate cancer admitted on 10/13/2022 with weakness and fatigue.  On presentation to ED, patient found to be positive for influenza A.  Patient being treated for CHF with [place] ending cardiomyopathy, severely reduced RV function, symptomatic bradycardia, hyponatremia, and FTT.   PMT was consulted to discuss goals of care.   Subjective: Chart reviewed including progress notes, labs, and vital signs. I visited [name] at bedside.  He is resting, and I did not attempt to wake him.  No nonverbal signs of discomfort noted.  Wife and daughter are at bedside.  They report has awoken enough to drink Glucerna as well as some orange juice.  We discussed disposition in the setting of the natural trajectory at end-of-life.  Discussed that [name] appears to be stable for discharge home with hospice as per original plan. Family does express some concern about ability to care for him at home, as he is non-ambulatory and will require total care for cleaning/turning. Discussed placing a foley catheter prior to discharge to promote comfort and dignity at EOL; family agrees. We also discussed the option to transfer from home to the hospice facility at some point.  Family ultimately states they feel "at peace" with bring [name]  home.  Plan of  care discussed with Dr. Broadus John, [facility], and hospice liaison.    Objective:  Physical Exam Vitals reviewed.  Constitutional:      General: He is sleeping. He is not in acute distress.    Appearance: He is ill-appearing.  Pulmonary:     Effort: Pulmonary effort is normal.             Vital Signs: BP (!) 135/48 (BP Location: Left Arm)   Pulse (!) 42   Temp 97.6 F (36.4 C) (Axillary)   Resp 18   Ht '5\' 9"'$  (1.753  m)   Wt 61.7 kg   SpO2 97%   BMI 20.09 kg/m  SpO2: SpO2: 97 % O2 Device: O2 Device: Room Air      Palliative Assessment/Data: PPS 20%     Palliative Medicine Assessment & Plan   Assessment: Principal Problem:   Influenza A Active Problems:   Hypertension   Coronary artery disease of native artery of native heart with stable angina pectoris (HCC)   Pure hypercholesterolemia   Persistent atrial fibrillation (HCC)   Chronic systolic CHF (congestive heart failure) (HCC)   Stage 3b chronic kidney disease (CKD) (HCC)   Type 2 diabetes mellitus with hyperlipidemia (HCC)   Anemia   Hyponatremia   Symptomatic bradycardia   Thrombocytopenia (HCC)   Gross hematuria   Failure to thrive in adult   Pressure injury of skin   NSVT (nonsustained ventricular tachycardia) ([place])    Recommendations/Plan: Plan for discharge home with hospice tomorrow - patient appears stable for this Place foley catheter prior to discharge PMT will continue to support as needed   Code Status: DNR/DNI   Prognosis:  < 2 weeks    Thank you for allowing the Palliative Medicine Team to assist in the care of this patient.   MDM - [facility]   Lavena Bullion, NP   Please contact Palliative Medicine Team phone at [phone] for questions and concerns.  For individual providers, please see AMION.

## 2022-10-24 NOTE — Care Management (Addendum)
10:30 Received call from  HoP liaison requesting update on status. Spoke w attending, continuing to assess w palliative team family readiness this morning to take patient home, given his ongoing decline.  Updated HoP.  10:40 message received from attending- will be tomorrow for home hospice DC, updated HoP

## 2022-10-25 DIAGNOSIS — J101 Influenza due to other identified influenza virus with other respiratory manifestations: Secondary | ICD-10-CM | POA: Diagnosis not present

## 2022-10-25 LAB — GLUCOSE, CAPILLARY: Glucose-Capillary: 113 mg/dL — ABNORMAL HIGH (ref 70–99)

## 2022-10-25 NOTE — Discharge Summary (Signed)
Physician Discharge Summary  [name] NLG:[id number] DOB: [birth year] DOA: 10/13/2022  PCP: Shawn Post, MD  Admit date: 10/13/2022 Discharge date: 10/25/2022  Time spent: 45 minutes  Recommendations for Outpatient Follow-up:  Home with hospice services  Discharge Diagnoses:  Principal Problem:   Influenza A Acute on chronic systolic CHF Severe RV failure   Failure to thrive in adult   Hyponatremia   Hypertension   Coronary artery disease of native artery of native heart with stable angina pectoris (HCC)   Pure hypercholesterolemia   Persistent atrial fibrillation (HCC)   Chronic systolic CHF (congestive heart failure) (HCC)   Stage 3b chronic kidney disease (CKD) ([place])   Type 2 diabetes mellitus with hyperlipidemia (HCC)   Anemia   Symptomatic bradycardia   Thrombocytopenia (HCC)   Gross hematuria   Pressure injury of skin   NSVT (nonsustained ventricular tachycardia) ([place])   Discharge Condition: Improved  Diet recommendation: Low-sodium, diabetic  Filed Weights   10/22/22 0503 10/23/22 0521 10/25/22 0608  Weight: 61.1 kg 61.7 kg 62.6 kg    History of present illness:  [name] is an 89 y.o. M with pAF on Eliquis, CAD s/p CABG and multiple PCI last >42yr DM, HTN, HLD, sdCHF EF now 25-30%, CKD IIIb baseline 1.7-2.0, COVID earlier this month, and prosCA who presented with weakness and fatigue.  -In the ER, influenza A positive. -Admitted on Tamiflu, CHF with worsening cardiomyopathy, echo noted EF of 25-30%, severely reduced RV function, symptomatic bradycardia,  cards consulted, Coreg discontinued -Treated with diuretics, complicated by hyponatremia, ongoing failure to thrive from well prior to this admission  Hospital Course:   Failure to thrive in adult -Per wife had ongoing decline few months ago then contracted COVID, declined further, now with influenza A, worsening cardiomyopathy  -Diuresed with IV Lasix, now euvolemic, oral intake is very poor   -Sodium continues to fluctuate -Palliative consulted for goals of care, original plan was for SNF for short-term rehab, now plan for home hospice   Influenza A -Completed 5 days Tamiflu.   - Oral intake poor.  Complicated by ongoing failure to thrive.      Acute on chronic systolic CHF Severe BiV failure -Known cardiomyopathy, echo this admission with EF down further to 25-30%, severe RV failure -Diuresed with IV Lasix initially, then held, oral intake is very poor -Cards following -Sodium now 129, diuretics resumed   Hyponatremia -Multifactorial in the setting of CHF, diuretics, SSRI etc. -A.m. cortisol is normal, SSRI held briefly, sodium improving   NSVT (nonsustained ventricular tachycardia) (HCC)   Thrombocytopenia (HCC) Platelets down to 75,000, due to influenza.  Now normalized.   Symptomatic bradycardia Cardiology consulted, BB stopped.  No further work up recommended. - Off Coreg   Anemia Hemoglobin stable    Type 2 diabetes mellitus with hyperlipidemia (HCC) Glucose controlled - Continue sliding scale corrections - Hold Januvia   Stage 3b chronic kidney disease (CKD) (HCC) Baseline 1.7-2, creatinine stable relative to baseline.  Acute kidney injury ruled out   Persistent atrial fibrillation (HCC) Rate controlled - Continue Eliquis - Hold carvedilol   Coronary artery disease of native artery of native heart with stable angina pectoris (HCC) - Continue Eliquis, Crestor - Hold carvedilol, spironolactone   Hypertension BP normal - Continue amlodipine - Hold spironolactone, Coreg,   Discharge Exam: Vitals:   10/25/22 0608 10/25/22 0943  BP: (!) 124/46 (!) 130/48  Pulse: (!) 42   Resp: 18   Temp: (!) 97.4 F (36.3 C)  SpO2: 98%    General exam: Awake alert, oriented to self and place CVS: S1-S2, regular rhythm Lungs: Decreased breath sounds at the bases Abdomen: Soft, nontender, bowel sounds present Extremities: Trace edema  Psych: Flat  affect   Discharge Instructions   Discharge Instructions     Diet - low sodium heart healthy   Complete by: As directed    Increase activity slowly   Complete by: As directed    No wound care   Complete by: As directed       Allergies as of 10/25/2022       Reactions   Codeine Other (See Comments)   Makes him feel goofy   Morphine Other (See Comments)   Nightmares and felt bad        Medication List     STOP taking these medications    carvedilol 6.25 MG tablet Commonly known as: COREG   cefdinir 300 MG capsule Commonly known as: OMNICEF   fluconazole 200 MG tablet Commonly known as: DIFLUCAN   Tresiba FlexTouch 100 UNIT/ML FlexTouch Pen Generic drug: insulin degludec       TAKE these medications    acetaminophen 500 MG tablet Commonly known as: TYLENOL Take 1,000 mg by mouth every 8 (eight) hours as needed for mild pain or moderate pain.   albuterol 108 (90 Base) MCG/ACT inhaler Commonly known as: VENTOLIN HFA TAKE 2 PUFFS BY MOUTH EVERY 6 HOURS AS NEEDED What changed: See the new instructions.   amLODipine 5 MG tablet Commonly known as: NORVASC TAKE 1 TABLET BY MOUTH EVERY DAY   BD Pen Needle Nano 2nd Gen 32G X 4 MM Misc Generic drug: Insulin Pen Needle USE AS DIRECTED.   benzonatate 100 MG capsule Commonly known as: TESSALON Take 1 capsule (100 mg total) by mouth every 8 (eight) hours. What changed:  when to take this reasons to take this   blood glucose meter kit and supplies Kit Dispense based on patient and insurance preference. Use up to four times daily as directed. (FOR ICD-9 250.00, 250.01).   clopidogrel 75 MG tablet Commonly known as: PLAVIX TAKE 1 TABLET BY MOUTH DAILY WITH BREAKFAST. What changed: when to take this   docusate sodium 100 MG capsule Commonly known as: Stool Softener Take 1 capsule (100 mg total) by mouth 2 (two) times daily as needed for mild constipation.   Eliquis 2.5 MG Tabs tablet Generic drug:  apixaban TAKE 1 TABLET BY MOUTH TWICE A DAY What changed: how much to take   FLUoxetine 20 MG capsule Commonly known as: PROZAC Take 1 capsule (20 mg total) by mouth daily.   hydrocortisone 2.5 % cream Apply 1 application topically daily as needed (facial breakouts).   ICAPS AREDS 2 PO Take 1 capsule by mouth daily.   Januvia 50 MG tablet Generic drug: sitaGLIPtin TAKE 1 TABLET BY MOUTH EVERY DAY What changed: how much to take   LUBRICATING EYE DROPS OP Place 1 drop into both eyes daily as needed (dry eyes).   nitroGLYCERIN 0.4 MG SL tablet Commonly known as: NITROSTAT PLACE 1 TABLET UNDER THE TONGUE EVERY 5 MINUTES X 3 DOSES AS NEEDED FOR CHEST PAIN What changed: See the new instructions.   NYQUIL COLD & FLU PO Take 30 mLs by mouth 2 (two) times daily as needed (for flu like symptoms).   pantoprazole 40 MG tablet Commonly known as: PROTONIX TAKE 1 TABLET BY MOUTH EVERY DAY   potassium chloride 10 MEQ tablet Commonly known as: KLOR-CON  TAKE 1 TABLET BY MOUTH EVERY DAY   rosuvastatin 20 MG tablet Commonly known as: CRESTOR TAKE 1 TABLET BY MOUTH EVERY DAY   spironolactone 25 MG tablet Commonly known as: ALDACTONE TAKE 1 TABLET (25 MG TOTAL) BY MOUTH DAILY. What changed: how much to take   Symbicort 160-4.5 MCG/ACT inhaler Generic drug: budesonide-formoterol INHALE 2 PUFFS INTO THE LUNGS IN THE MORNING AND AT BEDTIME.   THERAWORX RELIEF EX Apply 1 application topically daily as needed (knee pain).   torsemide 20 MG tablet Commonly known as: DEMADEX TAKE 1 TABLET BY MOUTH EVERY DAY   VITAMIN B-12 PO Take 1 tablet by mouth daily.       Allergies  Allergen Reactions   Codeine Other (See Comments)    Makes him feel goofy   Morphine Other (See Comments)    Nightmares and felt bad    Follow-up Information     Shawn Post, MD. Go on 10/26/2022.   Specialty: Family Medicine Why: '@1'$ :15pm arrive '@1'$ :00pm Contact information: [address] [place] [address] [phone]                  The results of significant diagnostics from this hospitalization (including imaging, microbiology, ancillary and laboratory) are listed below for reference.    Significant Diagnostic Studies: DG CHEST PORT 1 VIEW  Result Date: 10/21/2022 CLINICAL DATA:  Cough and congestion.  Flu and COVID. EXAM: PORTABLE CHEST 1 VIEW COMPARISON:  10/19/2022 FINDINGS: Right-sided PICC line at superior caval/atrial junction. Patient rotated to the right with prior median sternotomy for CABG. Mild cardiomegaly. Atherosclerosis in the transverse aorta. No pleural effusion or pneumothorax. Mild interstitial edema. Similar appearance of right greater than left, lower and peripheral predominant airspace disease. IMPRESSION: No significant change since 10/19/2022. Multifocal peripheral and basilar predominant pulmonary opacities, favoring pneumonia. Cardiomegaly and mild interstitial edema. Aortic Atherosclerosis (ICD10-I70.0). Electronically Signed   By: Abigail Miyamoto M.D.   On: 10/21/2022 09:21   DG CHEST PORT 1 VIEW  Result Date: 10/19/2022 CLINICAL DATA:  PIC catheter adjustment EXAM: PORTABLE CHEST 1 VIEW COMPARISON:  10/19/2022 at 3:16 p.m. as well as many others. FINDINGS: Interval retraction of the right-sided PICC catheter with the tip near the SVC right atrial junction. Status [name] median sternotomy. Enlarged cardiopericardial silhouette with aTAVR device. Stable patchy bilateral lung opacities, right greater than left. No pneumothorax or effusion. Overlapping cardiac leads. Calcified aorta IMPRESSION: Slight retraction of the PICC catheter with the tip near the SVC right atrial junction Electronically Signed   By: Jill Side M.D.   On: 10/19/2022 18:25   DG CHEST PORT 1 VIEW  Result Date: 10/19/2022 CLINICAL DATA:  PICC in place EXAM: PORTABLE CHEST 1 VIEW COMPARISON:  Radiograph 10/13/2022 FINDINGS: Unchanged cardiomediastinal silhouette with prior  TAVR, CABG, and coronary stent. Right upper extremity PICC tip overlies the right atrium. There is increased right mid to lower lung airspace disease and similar left lower lung airspace opacities. No pleural effusion or evidence of pneumothorax. Bones are unchanged. IMPRESSION: Increased right mid to lower lung airspace disease and similar left lower lung airspace opacities, consistent with multifocal pneumonia. Right upper extremity PICC tip overlies the right atrium. Electronically Signed   By: Maurine Simmering M.D.   On: 10/19/2022 15:27   [place] EKG SITE RITE  Result Date: 10/19/2022 If Site Rite image not attached, placement could not be confirmed due to current cardiac rhythm.  CT CHEST WO CONTRAST  Result Date: 10/14/2022 CLINICAL DATA:  Pneumonia. Pleural effusion. EXAM: CT CHEST WITHOUT CONTRAST TECHNIQUE: Multidetector CT imaging of the chest was performed following the standard protocol without IV contrast. RADIATION DOSE REDUCTION: This exam was performed according to the departmental dose-optimization program which includes automated exposure control, adjustment of the mA and/or kV according to patient size and/or use of iterative reconstruction technique. COMPARISON:  09/02/2021 FINDINGS: Cardiovascular: The heart is upper limits of normal in size for the patient's age. No pericardial effusion. Stable surgical changes from TAVR. Stable surgical changes from coronary artery bypass surgery. Stable aortic and extensive three-vessel coronary artery calcifications. Mediastinum/Nodes: Stable scattered calcified and noncalcified mediastinal and hilar lymph nodes likely related to remote granulomas disease. No mass or overt adenopathy the. The esophagus is grossly. Lungs/Pleura: Stable underlying emphysematous changes and areas of pulmonary scarring. Significant areas of ground-glass opacity most notably in the right middle lobe and right upper lobe. This is typically seen with partial airspace filling process  such as edema, inflammations/alveolitis. It could also be seen with constrictive bronchiolitis, hypersensitivity pneumonitis or cryptogenic organizing pneumonia. Stable area of rounded atelectasis at the left lung base. Moderate bibasilar atelectasis. No pleural effusions or pulmonary edema. Upper Abdomen: No significant upper abdominal findings. Stable advanced vascular disease. Musculoskeletal: No chest wall mass, supraclavicular or axillary adenopathy. The bony thorax is intact. IMPRESSION: 1. Stable emphysematous changes and areas of pulmonary scarring. 2. Significant areas of ground-glass opacity most notably in the right middle lobe and right upper lobe. This is typically seen with partial airspace filling process such as edema, inflammations/alveolitis. It could also be seen with constrictive bronchiolitis, hypersensitivity pneumonitis or cryptogenic organizing pneumonia. 3. Stable area of rounded atelectasis at the left lung base. 4. Stable advanced vascular disease. 5. No mediastinal or hilar mass or adenopathy. Stable scattered calcified and noncalcified nodes. 6. Stable surgical changes from TAVR and coronary artery bypass surgery. Aortic Atherosclerosis (ICD10-I70.0) and Emphysema (ICD10-J43.9). Electronically Signed   By: Marijo Sanes M.D.   On: 10/14/2022 14:06   ECHOCARDIOGRAM COMPLETE  Result Date: 10/13/2022    ECHOCARDIOGRAM REPORT   Patient Name:   [name] [place] Date of Exam: 10/13/2022 Medical Rec #:  [id number]    Height:       69.0 in Accession #:    [id number]   Weight:       147.6 lb Date of Birth:  06/19/1933    BSA:          1.816 m Patient Age:    89 years     BP:           121/67 mmHg Patient Gender: M            HR:           75 bpm. Exam Location:  Inpatient Procedure: 2D Echo, Cardiac Doppler and Color Doppler Indications:    Chest Pain R07.9  History:        Patient has prior history of Echocardiogram examinations, most                 recent 02/06/2022. Cardiomyopathy, Aortic  Valve Disease; Risk                 Factors:Diabetes.                 Aortic Valve: 29 mm Medtronic valve is present in the aortic                 position. Procedure Date: 04/28/21.  Sonographer:  Greer Pickerel Referring Phys: [id number] Margie Billet  Sonographer Comments: No subcostal window. Image acquisition challenging due to patient body habitus and Image acquisition challenging due to respiratory motion. IMPRESSIONS  1. Left ventricular ejection fraction, by estimation, is 25 to 30%. The left ventricle has severely decreased function. The left ventricle demonstrates global hypokinesis that appears worse in the septal segments. There is moderate concentric left ventricular hypertrophy. Left ventricular diastolic parameters are indeterminate.  2. Right ventricular systolic function is severely reduced. The right ventricular size is normal. PASP is 39.74mHg +RAP.  3. Left atrial size was severely dilated.  4. Right atrial size was severely dilated.  5. The mitral valve is degenerative. Mild mitral valve regurgitation. Moderate to severe mitral annular calcification.  6. The aortic valve has been repaired/replaced. Aortic valve regurgitation is trivial. There is a 29 mm Medtronic valve present in the aortic position. Procedure Date: 04/28/21. Mean gradient 1.44mg with Vmax 0.67m38min the setting of low output state. LVOT VTI 12.4.  7. Aortic dilatation noted. There is mild dilatation of the ascending aorta, measuring 40 mm. Comparison(s): Compared to prior TTE on 05/2022, the LVEF has decreased to 25-30% with continued septal wall motion abnormalites. FINDINGS  Left Ventricle: Left ventricular ejection fraction, by estimation, is 25 to 30%. The left ventricle has severely decreased function. There is global hypokinesis that appears worse in the septal segments. The left ventricular internal cavity size was normal in size. There is moderate concentric left ventricular hypertrophy. Left ventricular diastolic parameters  are indeterminate. Right Ventricle: The right ventricular size is normal. No increase in right ventricular wall thickness. Right ventricular systolic function is severely reduced. Left Atrium: Left atrial size was severely dilated. Right Atrium: Right atrial size was severely dilated. Pericardium: There is no evidence of pericardial effusion. Mitral Valve: The mitral valve is degenerative in appearance. There is moderate thickening of the mitral valve leaflet(s). There is mild calcification of the mitral valve leaflet(s). Moderate to severe mitral annular calcification. Mild mitral valve regurgitation. Tricuspid Valve: The tricuspid valve is normal in structure. Tricuspid valve regurgitation is trivial. Aortic Valve: The aortic valve has been repaired/replaced. Aortic valve regurgitation is trivial. There is a 29 mm Medtronic valve present in the aortic position. Procedure Date: 04/28/21. Pulmonic Valve: The pulmonic valve was normal in structure. Pulmonic valve regurgitation is trivial. Aorta: Aortic dilatation noted. There is mild dilatation of the ascending aorta, measuring 40 mm. IAS/Shunts: The atrial septum is grossly normal.  LEFT VENTRICLE PLAX 2D LVIDd:         4.20 cm      Diastology LVIDs:         3.30 cm      LV e' medial:  3.37 cm/s LV PW:         1.60 cm      LV e' lateral: 5.15 cm/s LV IVS:        1.30 cm LVOT diam:     2.20 cm LV SV:         37 LV SV Index:   20 LVOT Area:     3.80 cm  LV Volumes (MOD) LV vol d, MOD A2C: 88.8 ml LV vol d, MOD A4C: 102.0 ml LV vol s, MOD A2C: 54.1 ml LV vol s, MOD A4C: 71.5 ml LV SV MOD A2C:     34.7 ml LV SV MOD A4C:     102.0 ml LV SV MOD BP:      34.6 ml RIGHT VENTRICLE RV S  prime:     4.67 cm/s TAPSE (M-mode): 0.9 cm LEFT ATRIUM              Index        RIGHT ATRIUM           Index LA diam:        4.50 cm  2.48 cm/m   RA Area:     28.80 cm LA Vol (A2C):   115.0 ml 63.33 ml/m  RA Volume:   97.00 ml  53.42 ml/m LA Vol (A4C):   103.0 ml 56.73 ml/m LA Biplane  Vol: 110.0 ml 60.58 ml/m  AORTIC VALVE             PULMONIC VALVE LVOT Vmax:   64.60 cm/s  PR End Diast Vel: 9.86 msec LVOT Vmean:  41.300 cm/s LVOT VTI:    0.098 m  AORTA Ao Root diam: 3.40 cm Ao Asc diam:  4.10 cm MITRAL VALVE             TRICUSPID VALVE MV Area (PHT): 4.15 cm  TR Peak grad:   39.4 mmHg MV Decel Time: 183 msec  TR Vmax:        314.00 cm/s                           SHUNTS                          Systemic VTI:  0.10 m                          Systemic Diam: 2.20 cm Gwyndolyn Kaufman MD Electronically signed by Gwyndolyn Kaufman MD Signature Date/Time: 10/13/2022/5:05:06 PM    Final    DG Chest Portable 1 View  Result Date: 10/13/2022 CLINICAL DATA:  89 year old male with history of shortness of breath. EXAM: PORTABLE CHEST 1 VIEW COMPARISON:  Chest x-ray 10/01/2022. FINDINGS: Lung volumes are low. Ill-defined airspace consolidation in the periphery of the lower right lung. Probable areas of subsegmental atelectasis in the base of the left lung. Mild blunting of the costophrenic sulci bilaterally suggesting trace bilateral pleural effusions. No pneumothorax. Cardiomegaly with cephalization of the pulmonary vasculature, but no frank pulmonary edema. Upper mediastinal contours are within normal limits. Atherosclerotic calcifications in the thoracic aorta. Status [name] median sternotomy for CABG. Patient is also status [name] TAVR. IMPRESSION: 1. New area of ill-defined airspace consolidation in the periphery of the right lung base either in the right middle or right lower lobe, concerning for developing pneumonia. Followup PA and lateral chest X-ray is recommended in 3-4 weeks following trial of antibiotic therapy to ensure resolution and exclude underlying malignancy. 2. Additional left basilar subsegmental atelectasis or scarring. 3. Trace bilateral pleural effusions. 4. Cardiomegaly with pulmonary venous congestion, but no frank pulmonary edema. 5. Aortic atherosclerosis. Electronically Signed    By: Vinnie Langton M.D.   On: 10/13/2022 05:58   DG Chest 2 View  Result Date: 10/03/2022 CLINICAL DATA:  Recent COVID pneumonia EXAM: CHEST - 2 VIEW COMPARISON:  Chest x-ray September 21, 2022 FINDINGS: The cardiomediastinal silhouette is unchanged in contour status [name] median sternotomy and CABG. Vascular stent projects over the heart. Chronic bandlike opacities in the left lower lobe, likely scarring/atelectasis. No acute focal pulmonary opacity. No pleural effusion or pneumothorax. The visualized upper abdomen is unremarkable. No acute osseous abnormality. IMPRESSION: 1. Stable chronic left  basilar opacities, likely scarring/atelectasis. 2. No acute cardiopulmonary abnormality. Electronically Signed   By: Beryle Flock M.D.   On: 10/03/2022 20:15    Microbiology: No results found for this or any previous visit (from the past 240 hour(s)).   Labs: Basic Metabolic Panel: Recent Labs  Lab 10/19/22 0856 10/19/22 1422 10/20/22 0605 10/21/22 0617 10/22/22 0530 10/23/22 0618  NA 124*  --  127* 125* 125* 129*  K 5.3*  --  4.8 4.8 4.8 4.5  CL 94*  --  95* 95* 96* 101  CO2 17*  --  23 21* 21* 21*  GLUCOSE 139*  --  84 97 140* 129*  BUN 36*  --  37* 37* 33* 28*  CREATININE 1.90*  --  1.73* 1.63* 1.47* 1.47*  CALCIUM 10.1  --  10.5* 10.0 10.3 10.7*  MG  --  2.0  --   --   --   --    Liver Function Tests: Recent Labs  Lab 10/19/22 0856  AST 22  ALT 30  ALKPHOS 132*  BILITOT 1.0  PROT 6.9  ALBUMIN 2.4*   No results for input(s): "LIPASE", "AMYLASE" in the last 168 hours. No results for input(s): "AMMONIA" in the last 168 hours. CBC: Recent Labs  Lab 10/19/22 0112 10/20/22 0605 10/21/22 0617 10/22/22 0530  WBC 11.8* 11.9* 9.7 10.7*  HGB 12.4* 11.6* 11.3* 11.8*  HCT 37.7* 35.3* 33.6* 34.5*  MCV 82.0 81.9 80.6 79.7*  PLT 155 172 194 201   Cardiac Enzymes: No results for input(s): "CKTOTAL", "CKMB", "CKMBINDEX", "TROPONINI" in the last 168 hours. BNP: BNP (last 3  results) Recent Labs    09/20/22 0849 09/21/22 0441 10/13/22 1616  BNP 612.5* 592.2* 1,046.9*    ProBNP (last 3 results) Recent Labs    11/11/21 1020 02/01/22 1003  PROBNP 1,541.0* 1,846.0*    CBG: Recent Labs  Lab 10/23/22 2041 10/24/22 0540 10/24/22 2003 10/24/22 2008 10/25/22 0625  GLUCAP 108* 98 251* 240* 113*       Signed:  Domenic Polite MD.  [facility] 10/25/2022, 1:49 PM

## 2022-10-25 NOTE — TOC Transition Note (Signed)
Transition of Care [facility]) - CM/SW Discharge Note   Patient Details  Name: [name] MRN: [id number] Date of Birth: February 02, 1933  [facility] CM/SW Contact:  Zenon Mayo, RN Phone Number: 10/25/2022, 9:06 AM   Clinical Narrative:    Patient is for dc today, NCM spoke with wife she said to schedule ptar for 10 am, address confirmed.  DME is at the house. NCM notified Cheri with [facility].    Final next level of care: [place] Barriers to Discharge: Continued Medical Work up   Patient Goals and CMS Choice      Discharge Placement                         Discharge Plan and Services Additional resources added to the After Visit Summary for                                       Social Determinants of Health (SDOH) Interventions SDOH Screenings   Food Insecurity: No Food Insecurity (08/26/2022)  Housing: [place]  (03/02/2021)  Transportation Needs: No Transportation Needs (08/26/2022)  Alcohol Screen: Low Risk  (03/02/2021)  Depression (PHQ2-9): High Risk (10/01/2022)  Financial Resource Strain: Low Risk  (03/29/2022)  Physical Activity: Inactive (03/29/2022)  Social Connections: Socially Integrated (03/02/2021)  Stress: No Stress Concern Present (03/29/2022)  Tobacco Use: Medium Risk (10/13/2022)     Readmission Risk Interventions    02/08/2022   10:44 AM  Readmission Risk Prevention Plan  Transportation Screening Complete  PCP or Specialist Appt within 3-5 Days Complete  HRI or [place] Complete  Social Work Consult for [place] Planning/Counseling [place] Not Applicable  Medication Review Press photographer) Complete

## 2022-10-25 NOTE — Plan of Care (Signed)
Patient will be d/c to home with Hospice. AVS printed and placed in d/c packet.

## 2022-10-25 NOTE — Care Management Important Message (Signed)
Important Message  Patient Details  Name: [name] MRN: [id number] Date of Birth: [birth year]   Medicare Important Message Given:  Yes     Shelda Altes 10/25/2022, 8:59 AM

## 2022-10-25 NOTE — Progress Notes (Signed)
   This pt has been referred to hospice services at home. We have been notified the pt will d/c today with pickup time of 1000am. We will follow up with them at home to initiate hospice care.   Thank you for this referral.  Webb Silversmith RN [phone]

## 2022-10-25 NOTE — Progress Notes (Signed)
Per order,  37 cm double Lumen Peripherally Inserted Central Catheter removed from right brachial, tip intact. No sutures present. RN confirmed length per chart. Dressing was clean and dry. Petroleum dressing applied. Pt advised no heavy lifting with this arm, leave dressing for 24 hours and call the office or seek emergent care if dressing becomes soaked with blood or swelling or sharp pain presents. Patient family verbalized understanding. Patient did continue to bleed after PICC removal. Firm pressure applied for 15 minutes. Additional pressure dressing applied.Questions answered to their satisfaction. Patient tolerated procedure well.  RN notified.  Honora Searson Lorita Officer, RN

## 2022-10-25 NOTE — Progress Notes (Signed)
PT Cancellation Note  Patient Details Name: [name] MRN: [id number] DOB: February 09, 1933   Cancelled Treatment:    Reason Eval/Treat Not Completed: Other (comment). Checked on pt and his wife to address any further questions on how to physically manage pt upon d/c as plan is now to d/c home with hospice. [name] reports no further questions at this time and requested PT allow pt to rest with no need to try to mobilize him at this time. Encouraged pt's wife to ask to speak with PT if she thinks of any questions later before d/c. Will follow along as needed.   Moishe Spice, PT, DPT Acute [facility] Services  Office: [place] 10/25/2022, 8:31 AM

## 2022-10-26 ENCOUNTER — Inpatient Hospital Stay: Payer: Medicare PPO | Admitting: Family Medicine

## 2022-11-03 ENCOUNTER — Telehealth: Payer: Self-pay | Admitting: Family Medicine

## 2022-11-03 NOTE — Telephone Encounter (Signed)
Pt wife is calling and pt has passed away on sssunday November 15, 2022

## 2022-11-08 NOTE — Telephone Encounter (Signed)
Sympathy card placed in office and patient's wife informed of the message below

## 2022-11-18 NOTE — Progress Notes (Unsigned)
Death Notification:  Colletta Maryland with [facility] called to notify us this mornings that Mr.Ridling has died in hospice care.

## 2022-11-18 DEATH — deceased
